# Patient Record
Sex: Male | Born: 1965 | Race: Black or African American | Hispanic: No | Marital: Married | Smoking: Never smoker
Health system: Southern US, Community
[De-identification: ages and names within clinical notes are randomized; demographics above are authoritative.]

## PROBLEM LIST (undated history)

## (undated) DIAGNOSIS — M1990 Unspecified osteoarthritis, unspecified site: Secondary | ICD-10-CM

## (undated) DIAGNOSIS — J189 Pneumonia, unspecified organism: Secondary | ICD-10-CM

## (undated) DIAGNOSIS — G4730 Sleep apnea, unspecified: Secondary | ICD-10-CM

## (undated) DIAGNOSIS — F329 Major depressive disorder, single episode, unspecified: Secondary | ICD-10-CM

## (undated) DIAGNOSIS — F32A Depression, unspecified: Secondary | ICD-10-CM

## (undated) DIAGNOSIS — E119 Type 2 diabetes mellitus without complications: Secondary | ICD-10-CM

## (undated) DIAGNOSIS — K3184 Gastroparesis: Secondary | ICD-10-CM

## (undated) DIAGNOSIS — E039 Hypothyroidism, unspecified: Secondary | ICD-10-CM

## (undated) DIAGNOSIS — Z889 Allergy status to unspecified drugs, medicaments and biological substances status: Secondary | ICD-10-CM

## (undated) DIAGNOSIS — I739 Peripheral vascular disease, unspecified: Secondary | ICD-10-CM

## (undated) DIAGNOSIS — I509 Heart failure, unspecified: Secondary | ICD-10-CM

## (undated) DIAGNOSIS — E1143 Type 2 diabetes mellitus with diabetic autonomic (poly)neuropathy: Secondary | ICD-10-CM

## (undated) DIAGNOSIS — I10 Essential (primary) hypertension: Secondary | ICD-10-CM

## (undated) DIAGNOSIS — Z992 Dependence on renal dialysis: Secondary | ICD-10-CM

## (undated) DIAGNOSIS — N186 End stage renal disease: Secondary | ICD-10-CM

## (undated) HISTORY — PX: HERNIA REPAIR: SHX51

---
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
## 2016-08-04 ENCOUNTER — Inpatient Hospital Stay (HOSPITAL_COMMUNITY)
Admission: EM | Admit: 2016-08-04 | Discharge: 2016-08-06 | DRG: 640 | Disposition: A | Discharge status: Home / Self Care | Payer: Medicare Other | Attending: Internal Medicine | Admitting: Internal Medicine

## 2016-08-04 ENCOUNTER — Emergency Department (HOSPITAL_COMMUNITY): Payer: Medicare Other

## 2016-08-04 ENCOUNTER — Encounter (HOSPITAL_COMMUNITY): Payer: Self-pay | Admitting: Family Medicine

## 2016-08-04 DIAGNOSIS — E1165 Type 2 diabetes mellitus with hyperglycemia: Secondary | ICD-10-CM | POA: Diagnosis present

## 2016-08-04 DIAGNOSIS — E8889 Other specified metabolic disorders: Secondary | ICD-10-CM | POA: Diagnosis present

## 2016-08-04 DIAGNOSIS — R42 Dizziness and giddiness: Secondary | ICD-10-CM | POA: Diagnosis present

## 2016-08-04 DIAGNOSIS — J9601 Acute respiratory failure with hypoxia: Secondary | ICD-10-CM | POA: Diagnosis present

## 2016-08-04 DIAGNOSIS — I5033 Acute on chronic diastolic (congestive) heart failure: Secondary | ICD-10-CM | POA: Diagnosis present

## 2016-08-04 DIAGNOSIS — E11621 Type 2 diabetes mellitus with foot ulcer: Secondary | ICD-10-CM

## 2016-08-04 DIAGNOSIS — R55 Syncope and collapse: Secondary | ICD-10-CM | POA: Diagnosis not present

## 2016-08-04 DIAGNOSIS — D696 Thrombocytopenia, unspecified: Secondary | ICD-10-CM | POA: Diagnosis present

## 2016-08-04 DIAGNOSIS — Z992 Dependence on renal dialysis: Secondary | ICD-10-CM | POA: Diagnosis not present

## 2016-08-04 DIAGNOSIS — I132 Hypertensive heart and chronic kidney disease with heart failure and with stage 5 chronic kidney disease, or end stage renal disease: Secondary | ICD-10-CM | POA: Diagnosis present

## 2016-08-04 DIAGNOSIS — E8779 Other fluid overload: Secondary | ICD-10-CM | POA: Diagnosis present

## 2016-08-04 DIAGNOSIS — R778 Other specified abnormalities of plasma proteins: Secondary | ICD-10-CM | POA: Diagnosis present

## 2016-08-04 DIAGNOSIS — Z794 Long term (current) use of insulin: Secondary | ICD-10-CM

## 2016-08-04 DIAGNOSIS — R51 Headache: Secondary | ICD-10-CM | POA: Diagnosis present

## 2016-08-04 DIAGNOSIS — R7989 Other specified abnormal findings of blood chemistry: Secondary | ICD-10-CM | POA: Diagnosis present

## 2016-08-04 DIAGNOSIS — D638 Anemia in other chronic diseases classified elsewhere: Secondary | ICD-10-CM | POA: Diagnosis present

## 2016-08-04 DIAGNOSIS — Z79899 Other long term (current) drug therapy: Secondary | ICD-10-CM | POA: Diagnosis not present

## 2016-08-04 DIAGNOSIS — Z8673 Personal history of transient ischemic attack (TIA), and cerebral infarction without residual deficits: Secondary | ICD-10-CM

## 2016-08-04 DIAGNOSIS — I509 Heart failure, unspecified: Secondary | ICD-10-CM

## 2016-08-04 DIAGNOSIS — N2581 Secondary hyperparathyroidism of renal origin: Secondary | ICD-10-CM | POA: Diagnosis present

## 2016-08-04 DIAGNOSIS — I5021 Acute systolic (congestive) heart failure: Secondary | ICD-10-CM | POA: Diagnosis not present

## 2016-08-04 DIAGNOSIS — Z9115 Patient's noncompliance with renal dialysis: Secondary | ICD-10-CM | POA: Diagnosis not present

## 2016-08-04 DIAGNOSIS — E1122 Type 2 diabetes mellitus with diabetic chronic kidney disease: Secondary | ICD-10-CM | POA: Diagnosis present

## 2016-08-04 DIAGNOSIS — R748 Abnormal levels of other serum enzymes: Secondary | ICD-10-CM | POA: Diagnosis not present

## 2016-08-04 DIAGNOSIS — I248 Other forms of acute ischemic heart disease: Secondary | ICD-10-CM | POA: Diagnosis present

## 2016-08-04 DIAGNOSIS — E118 Type 2 diabetes mellitus with unspecified complications: Secondary | ICD-10-CM | POA: Diagnosis not present

## 2016-08-04 DIAGNOSIS — R9431 Abnormal electrocardiogram [ECG] [EKG]: Secondary | ICD-10-CM | POA: Diagnosis present

## 2016-08-04 DIAGNOSIS — L97509 Non-pressure chronic ulcer of other part of unspecified foot with unspecified severity: Secondary | ICD-10-CM

## 2016-08-04 DIAGNOSIS — N186 End stage renal disease: Secondary | ICD-10-CM | POA: Diagnosis present

## 2016-08-04 DIAGNOSIS — D631 Anemia in chronic kidney disease: Secondary | ICD-10-CM

## 2016-08-04 DIAGNOSIS — I252 Old myocardial infarction: Secondary | ICD-10-CM

## 2016-08-04 DIAGNOSIS — I161 Hypertensive emergency: Secondary | ICD-10-CM

## 2016-08-04 HISTORY — DX: End stage renal disease: N18.6

## 2016-08-04 HISTORY — DX: Type 2 diabetes mellitus without complications: E11.9

## 2016-08-04 HISTORY — DX: Heart failure, unspecified: I50.9

## 2016-08-04 HISTORY — DX: Essential (primary) hypertension: I10

## 2016-08-04 HISTORY — DX: Dependence on renal dialysis: Z99.2

## 2016-08-04 LAB — BASIC METABOLIC PANEL
Anion gap: 17 — ABNORMAL HIGH (ref 5–15)
BUN: 112 mg/dL — ABNORMAL HIGH (ref 6–20)
CO2: 24 mmol/L (ref 22–32)
Calcium: 9 mg/dL (ref 8.9–10.3)
Chloride: 97 mmol/L — ABNORMAL LOW (ref 101–111)
Creatinine, Ser: 20.07 mg/dL — ABNORMAL HIGH (ref 0.61–1.24)
GFR calc Af Amer: 3 mL/min — ABNORMAL LOW (ref >60)
GFR calc non Af Amer: 2 mL/min — ABNORMAL LOW (ref >60)
Glucose, Bld: 191 mg/dL — ABNORMAL HIGH (ref 65–99)
Potassium: 5.8 mmol/L — ABNORMAL HIGH (ref 3.5–5.1)
Sodium: 138 mmol/L (ref 135–145)

## 2016-08-04 LAB — GLUCOSE, CAPILLARY
Glucose-Capillary: 163 mg/dL — ABNORMAL HIGH (ref 65–99)
Glucose-Capillary: 180 mg/dL — ABNORMAL HIGH (ref 65–99)

## 2016-08-04 LAB — PHOSPHORUS: Phosphorus: 6.7 mg/dL — ABNORMAL HIGH (ref 2.5–4.6)

## 2016-08-04 LAB — I-STAT TROPONIN, ED: Troponin i, poc: 0.55 ng/mL (ref 0.00–0.08)

## 2016-08-04 LAB — CBC
HCT: 40 % (ref 39.0–52.0)
Hemoglobin: 13.3 g/dL (ref 13.0–17.0)
MCH: 32.4 pg (ref 26.0–34.0)
MCHC: 33.3 g/dL (ref 30.0–36.0)
MCV: 97.6 fL (ref 78.0–100.0)
Platelets: 132 10*3/uL — ABNORMAL LOW (ref 150–400)
RBC: 4.1 MIL/uL — ABNORMAL LOW (ref 4.22–5.81)
RDW: 14.9 % (ref 11.5–15.5)
WBC: 10.4 10*3/uL (ref 4.0–10.5)

## 2016-08-04 LAB — TSH: TSH: 1.679 u[IU]/mL (ref 0.350–4.500)

## 2016-08-04 LAB — MAGNESIUM: Magnesium: 2.7 mg/dL — ABNORMAL HIGH (ref 1.7–2.4)

## 2016-08-04 LAB — BRAIN NATRIURETIC PEPTIDE: B Natriuretic Peptide: 1811.8 pg/mL — ABNORMAL HIGH (ref 0.0–100.0)

## 2016-08-04 LAB — TROPONIN I: Troponin I: 0.1 ng/mL (ref <0.03)

## 2016-08-04 MED ORDER — HYDRALAZINE HCL 20 MG/ML IJ SOLN: 5.0000 mg | INTRAMUSCULAR | Status: DC | PRN

## 2016-08-04 MED ORDER — LISINOPRIL 10 MG PO TABS
10.0000 mg | ORAL_TABLET | Freq: Every day | ORAL | Status: DC
  Administered 2016-08-04: 10 mg via ORAL
  Filled 2016-08-04 (×2): qty 1

## 2016-08-04 MED ORDER — ACETAMINOPHEN 325 MG PO TABS
650.0000 mg | ORAL_TABLET | Freq: Four times a day (QID) | ORAL | Status: DC | PRN
  Administered 2016-08-05: 650 mg via ORAL

## 2016-08-04 MED ORDER — ACETAMINOPHEN 650 MG RE SUPP: 650.0000 mg | Freq: Four times a day (QID) | RECTAL | Status: DC | PRN

## 2016-08-04 MED ORDER — SODIUM CHLORIDE 0.9% FLUSH
3.0000 mL | Freq: Two times a day (BID) | INTRAVENOUS | Status: DC
  Administered 2016-08-04 – 2016-08-06 (×4): 3 mL via INTRAVENOUS

## 2016-08-04 MED ORDER — HEPARIN SODIUM (PORCINE) 5000 UNIT/ML IJ SOLN
5000.0000 [IU] | Freq: Three times a day (TID) | INTRAMUSCULAR | Status: DC
  Administered 2016-08-04 – 2016-08-06 (×4): 5000 [IU] via SUBCUTANEOUS
  Filled 2016-08-04 (×6): qty 1

## 2016-08-04 MED ORDER — SODIUM CHLORIDE 0.9% FLUSH: 3.0000 mL | INTRAVENOUS | Status: DC | PRN

## 2016-08-04 MED ORDER — CINACALCET HCL 30 MG PO TABS
90.0000 mg | ORAL_TABLET | Freq: Every day | ORAL | Status: DC
  Administered 2016-08-04 – 2016-08-05 (×2): 90 mg via ORAL
  Filled 2016-08-04 (×3): qty 3

## 2016-08-04 MED ORDER — ASPIRIN 81 MG PO CHEW
324.0000 mg | CHEWABLE_TABLET | Freq: Once | ORAL | Status: AC
  Administered 2016-08-04: 324 mg via ORAL
  Filled 2016-08-04: qty 4

## 2016-08-04 MED ORDER — INSULIN ASPART 100 UNIT/ML ~~LOC~~ SOLN: 0.0000 [IU] | Freq: Every day | SUBCUTANEOUS | Status: DC

## 2016-08-04 MED ORDER — FERRIC CITRATE 1 GM 210 MG(FE) PO TABS: 630.0000 mg | ORAL_TABLET | Freq: Three times a day (TID) | ORAL | Status: DC

## 2016-08-04 MED ORDER — ONDANSETRON HCL 4 MG/2ML IJ SOLN: 4.0000 mg | Freq: Four times a day (QID) | INTRAMUSCULAR | Status: DC | PRN

## 2016-08-04 MED ORDER — SODIUM CHLORIDE 0.9 % IV SOLN: 250.0000 mL | INTRAVENOUS | Status: DC | PRN

## 2016-08-04 MED ORDER — AMLODIPINE BESYLATE 10 MG PO TABS
10.0000 mg | ORAL_TABLET | Freq: Every day | ORAL | Status: DC
  Administered 2016-08-04: 10 mg via ORAL
  Filled 2016-08-04: qty 2
  Filled 2016-08-04: qty 1

## 2016-08-04 MED ORDER — NITROGLYCERIN 2 % TD OINT
1.0000 [in_us] | TOPICAL_OINTMENT | Freq: Once | TRANSDERMAL | Status: AC
  Administered 2016-08-04: 1 [in_us] via TOPICAL
  Filled 2016-08-04: qty 1

## 2016-08-04 MED ORDER — CINACALCET HCL 30 MG PO TABS: 90.0000 mg | ORAL_TABLET | Freq: Every evening | ORAL | Status: DC

## 2016-08-04 MED ORDER — ONDANSETRON HCL 4 MG PO TABS: 4.0000 mg | ORAL_TABLET | Freq: Four times a day (QID) | ORAL | Status: DC | PRN

## 2016-08-04 MED ORDER — FERRIC CITRATE 1 GM 210 MG(FE) PO TABS
420.0000 mg | ORAL_TABLET | Freq: Three times a day (TID) | ORAL | Status: DC
  Administered 2016-08-04 – 2016-08-06 (×4): 420 mg via ORAL
  Filled 2016-08-04 (×7): qty 2

## 2016-08-04 MED ORDER — ACETAMINOPHEN 500 MG PO TABS
1000.0000 mg | ORAL_TABLET | Freq: Once | ORAL | Status: AC
  Administered 2016-08-04: 1000 mg via ORAL
  Filled 2016-08-04: qty 2

## 2016-08-04 MED ORDER — METOPROLOL SUCCINATE ER 100 MG PO TB24
100.0000 mg | ORAL_TABLET | Freq: Every day | ORAL | Status: DC
  Administered 2016-08-04: 100 mg via ORAL
  Filled 2016-08-04 (×2): qty 1

## 2016-08-04 MED ORDER — BRIMONIDINE TARTRATE 0.2 % OP SOLN
1.0000 [drp] | Freq: Two times a day (BID) | OPHTHALMIC | Status: DC
  Administered 2016-08-04 – 2016-08-06 (×3): 1 [drp] via OPHTHALMIC
  Filled 2016-08-04: qty 5

## 2016-08-04 MED ORDER — INSULIN ASPART 100 UNIT/ML ~~LOC~~ SOLN
0.0000 [IU] | Freq: Three times a day (TID) | SUBCUTANEOUS | Status: DC
Start: 2016-08-04 — End: 2016-08-06
  Administered 2016-08-04 – 2016-08-05 (×3): 2 [IU] via SUBCUTANEOUS
  Administered 2016-08-06: 3 [IU] via SUBCUTANEOUS

## 2016-08-04 NOTE — Consult Note (Signed)
[facility] Renal Consultation Note    Indication for Consultation:  Management of ESRD/hemodialysis, anemia, hypertension/volume, and secondary hyperparathyroidism. PCP:  HPI: Howard Bell is a 51 y.o. male with ESRD (x 8 years), Type 2 DM, CAD (Hx MI), ?ICM (LVEF 26% in 2015 per outpt HD notes), Hx CVA who is being admitted for dyspnea/volume overload in setting of missed HD.  Usually dialyzes MWF at [facility], just moved here in early April 2018 from [place], [place]. Last dialysis was Friday, 07/30/16. Missed HD on Monday (5/14) due to court date in [place] and unable to get HD scheduled there. Was feeling fine until yesterday when he felt dizzy, then this morning started vomiting and had syncopal episode prompting EMS being called and transporting here. His wife also noticed that he was breathing harder than usual last night, also that his face and L arm were swollen. In ED, labs showed Hgb 13.3, WBC 10.4, K 5.8, Trop 0.55. EKG abnormal per ED team; ?ischemic changes (not in Epic). CXR showed + pulmonary edema. Feels better already 2 hours into HD- goal of 5 liters  ROS (limited as pt on oxygen and lethargic): Denies fever, chills, abdominal pain or chest pain. + dyspnea (on nasal oyxgen). + headache, dizzy yesterday. + syncope this morning.   Physical Exam: Vitals:   08/04/16 1015 08/04/16 1030 08/04/16 1100 08/04/16 1115  BP: (!) 199/128 (!) 185/108 (!) 179/104 (!) 182/114  Pulse: (!) 105 (!) 105 (!) 105 (!) 105  Resp: 17 (!) 26 (!) 30 (!) 23  Temp:      TempSrc:      SpO2: 100% 91% 92% (!) 89%     General: Well developed, well nourished, NAD, but breathing heavily on nasal oxygen. Head: Normocephalic, atraumatic. + facial edema. Neck: Supple without lymphadenopathy/masses.  Lungs: Bibasilar rales. Clear in upper lobes. Heart: RRR with normal S1, S2. No murmurs, rubs, or gallops appreciated. Abdomen: Soft, non-tender. Musculoskeletal:  Strength and tone  appear normal for age. Lower extremities: No LE edema. Small scabbed area on L shin, callous on L calf area. Neuro: Alert and oriented X 3, although lethargic. Moves all extremities spontaneously. Psych:  Responds to questions appropriately. Dialysis Access: LUE AVG + bruit/thrill.  Allergies: No Known Allergies listed here, Ferrelicit on home allergy list.  Medications: Per home med list: Auryxia 2/meals, Sensipar 90mg  QD, Norvasc 10mg  QD, Toprol XL 100mg  QD, Humalog 6U TID with meals, eye drops (trosopt, brimonidine, travaprost).  Labs: Basic Metabolic Panel:  Recent Labs Lab 08/04/16 1010  NA 138  K 5.8*  CL 97*  CO2 24  GLUCOSE 191*  BUN 112*  CREATININE 20.07*  CALCIUM 9.0   CBC:  Recent Labs Lab 08/04/16 1010  WBC 10.4  HGB 13.3  HCT 40.0  MCV 97.6  PLT 132*   Studies/Results: Dg Chest Port 1 View  Result Date: 08/04/2016 CLINICAL DATA:  Shortness of breath. EXAM: PORTABLE CHEST 1 VIEW COMPARISON:  No recent prior. FINDINGS: Cardiomegaly. Bilateral pulmonary infiltrates, particular prominent on the right are noted. Findings consistent with asymmetric pulmonary edema and/or pneumonia. No pleural effusion or pneumothorax. IMPRESSION: 1. Cardiomegaly. 2. Diffuse bilateral pulmonary infiltrates, particularly prominent on the right. Findings consist with asymmetric pulmonary edema and/or pneumonia. Electronically Signed   By: Marcello Moores  Register   On: 08/04/2016 10:30    Dialysis Orders:  MWF at [facility] 4:15hr, BFR 450, DFRA2.0, EDW 94kg, 2K/2.25Ca bath, AVG - Heparin 9K units q HD - No VDRA as outpt  d/t high Ca - BMM meds: Auryxia 2/meals, Sensipar 90mg  daily  Assessment/Plan: 1.  Acute Respiratory Failure: In setting of missed HD, for urgent HD today. Likely will need another HD tomorrow. Wait and see 2. ?Abnormal EKG: No baseline EKG in system. Hx prior MI. Work-up per primary team. 3.  ESRD: Usual MWF schedule. Missed last HD. Plan for HD today  and tomorrow, then back to MWF schedule. 4.  Hypertension/volume: BP high with volume excess. 4-5L goal today. 5.  Anemia: Hgb 13.3. No need for ESA. 6.  Metabolic bone disease: Ca 9. Continue Auryxia/Sensipar. 7.  DM: on insulin, per primary.  Veneta Penton, PA-C 08/04/2016, 11:21 AM  [facility]  Patient seen and examined, agree with above note with above modifications. Fairly new to [place].  Missed HD on Monday due to court date- now presents with SOB- volume overload.   HD this afternoon- goal of 5 liters- tolerating well and feeling better already  Corliss Parish, MD 08/04/2016    Pager: [phone]

## 2016-08-04 NOTE — Consult Note (Signed)
CARDIOLOGY CONSULT NOTE  Patient ID: Howard Bell MRN: [id number] DOB/AGE: [birth year] 51 y.o.  Admit date: 08/04/2016 Referring Physician  Orlie Dakin, MD Primary Physician:  No primary care provider on file. Reason for Consultation  Abnormal EKG  HPI: Howard Bell  is a 51 y.o. male . Patient has history of end-stage liver disease on hemodialysis for the past 6-7 years, previously uncontrolled diabetes mellitus, now well controlled, hypertension, also previously not well controlled. He recently moved to [facility] from [facility] area where he was getting hemodialysis regularly. He also states that last year due to shortness of breath, he was admitted to the hospital in [place], [place] and has had a echocardiogram and a stress test and was told to be normal.  He presented to the emergency room with worsening shortness of breath via EMS and was found to be hypoxemic with marked respiratory distress. Due to abnormal EKG, serum troponins were obtained which was mildly elevated. I was asked by ED physicians to evaluate the patient from cardiac stent point.  Patient symptoms improved with routine therapy with Nitropaste, oxygen supplementation. He denied any chest pain. Denies any symptoms suggestive claudication. He has not had any history of TIA or stroke but states that when he was 51 years of age, he was having nausea and vomiting and was taken to the doctor's office by his mother and was told to have a stroke, history is very sketchy.  Past Medical History:  Diagnosis Date  . Congestive heart failure (CHF) ([place])   . 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 ESRD (end stage renal disease) on dialysis ([place])   . Hypertension      Past Surgical History:  Procedure Laterality Date  . HERNIA REPAIR       Family History  Problem Relation Age of Onset  . Diabetes Mother   . Hypertension Father      Social History: Social History   Social  History  . Marital status: N/A    Spouse name: N/A  . Number of children: N/A  . Years of education: N/A   Occupational History  . Not on file.   Social History Main Topics  . Smoking status: Never Smoker  . Smokeless tobacco: Never Used  . Alcohol use No  . Drug use: No  . Sexual activity: Not on file   Other Topics Concern  . Not on file   Social History Narrative  . No narrative on file     Prescriptions Prior to Admission  Medication Sig Dispense Refill Last Dose  . amLODipine (NORVASC) 10 MG tablet Take 10 mg by mouth daily.   08/03/2016 at Unknown time  . brimonidine (ALPHAGAN) 0.2 % ophthalmic solution Place 1 drop into both eyes 2 (two) times daily.   08/03/2016 at Unknown time  . cinacalcet (SENSIPAR) 90 MG tablet Take 90 mg by mouth every evening.    08/03/2016 at Unknown time  . ferric citrate (AURYXIA) 1 GM 210 MG(Fe) tablet Take 630 mg by mouth 3 (three) times daily with meals.   08/03/2016 at Unknown time  . insulin lispro (HUMALOG KWIKPEN) 100 UNIT/ML KiwkPen Inject 5-10 Units into the skin See admin instructions. Per sliding scale   08/03/2016 at Unknown time  . lisinopril (PRINIVIL,ZESTRIL) 10 MG tablet Take 10 mg by mouth daily.   08/03/2016 at Unknown time  . metoprolol succinate (TOPROL-XL) 100 MG 24 hr tablet Take 100 mg by mouth daily. Take with or immediately following  a meal.   08/03/2016 at 0700     Review of Systems - Marked shortness of breath, generalized weakness, decreased physical tolerance. No change in appetite, no change in weight, no tingling or numbness or weakness in the extremity, no nausea or vomiting, no dark stools, has mild arthritis in his knee, other systems negative.   Physical Exam: Blood pressure 138/88, pulse (!) 101, temperature 98.9 F (37.2 C), temperature source Oral, resp. rate 18, weight 99.9 kg (220 lb 3.8 oz), SpO2 98 %. There is no height or weight on file to calculate BMI.   General appearance: alert, cooperative, appears  older than stated age, mild distress and moderately obese Neck: no adenopathy, no carotid bruit, supple, symmetrical, trachea midline, thyroid not enlarged, symmetric, no tenderness/mass/nodules and JVD present Lungs: clear to auscultation bilaterally Chest wall: no tenderness Heart: regular rate and rhythm, S1, S2 normal, no murmur, click, rub or gallop Abdomen: soft, non-tender; bowel sounds normal; no masses,  no organomegaly and Obese Extremities: extremities normal, atraumatic, no cyanosis or edema Pulses: 2+ and symmetric, left brachial AV fistula present and functioning. Neurologic: Grossly normal  Labs:  Lab Results  Component Value Date   WBC 10.4 08/04/2016   HGB 13.3 08/04/2016   HCT 40.0 08/04/2016   MCV 97.6 08/04/2016   PLT 132 (L) 08/04/2016    Recent Labs Lab 08/04/16 1010  NA 138  K 5.8*  CL 97*  CO2 24  BUN 112*  CREATININE 20.07*  CALCIUM 9.0  GLUCOSE 191*    Lipid Panel  No results found for: CHOL, TRIG, HDL, CHOLHDL, VLDL, LDLCALC  BNP (last 3 results) No results for input(s): BNP in the last 8760 hours.  HEMOGLOBIN A1C No results found for: HGBA1C, MPG  Cardiac Panel (last 3 results) No results for input(s): CKTOTAL, CKMB, TROPONINI, RELINDX in the last 8760 hours.  No results found for: CKTOTAL, CKMB, CKMBINDEX, TROPONINI   TSH No results for input(s): TSH in the last 8760 hours.    Radiology: Dg Chest Port 1 View  Result Date: 08/04/2016 CLINICAL DATA:  Shortness of breath. EXAM: PORTABLE CHEST 1 VIEW COMPARISON:  No recent prior. FINDINGS: Cardiomegaly. Bilateral pulmonary infiltrates, particular prominent on the right are noted. Findings consistent with asymmetric pulmonary edema and/or pneumonia. No pleural effusion or pneumothorax. IMPRESSION: 1. Cardiomegaly. 2. Diffuse bilateral pulmonary infiltrates, particularly prominent on the right. Findings consist with asymmetric pulmonary edema and/or pneumonia. Electronically Signed   By:  [place]   On: 08/04/2016 10:30    Scheduled Meds: . amLODipine  10 mg Oral Daily  . brimonidine  1 drop Both Eyes BID  . cinacalcet  90 mg Oral Q supper  . ferric citrate  420 mg Oral TID WC  . heparin  5,000 Units Subcutaneous Q8H  . insulin aspart  0-5 Units Subcutaneous QHS  . insulin aspart  0-9 Units Subcutaneous TID WC  . lisinopril  10 mg Oral Daily  . metoprolol succinate  100 mg Oral Daily  . sodium chloride flush  3 mL Intravenous Q12H   Continuous Infusions: . sodium chloride     PRN Meds:.sodium chloride, acetaminophen **OR** acetaminophen, hydrALAZINE, ondansetron **OR** ondansetron (ZOFRAN) IV, sodium chloride flush  CARDIAC STUDIES: EKG: Normal sinus rhythm, left axis deviation, left anterior fascicular block, LVH with repolarization. Normal QT interval.  Echo: Pending  ASSESSMENT AND PLAN:  1. Acute on chronic diastolic heart failure due to missing hemodialysis, patient presently volume overloaded. 2. 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 Hypertension with hypertensive heart disease with CHF 4. Abnormal serum troponin secondary to CHF. 5. Thrombocytopenia,? Chronic  Recommendation: Patient states that he's had an echocardiogram and also nuclear stress test in [place], [place] a year ago and was told to be normal. I do not see any acute ischemic changes from the EKG. Symptoms are not consistent with ACS.  Suspect with dialysis reinitiation, that his symptoms will improve. I have discussed with them regarding making lifestyle changes, unfortunately in spite of multiple medical comorbidities, patient does not appear to have made any dietary changes. He is otherwise on appropriate medical therapy. Control of blood pressure, regular scheduled hemodialysis would probably keep him stable. I'll be happy to see him back on a when necessary basis in the outpatient setting. Would recommend checking his lipid profile.  Adrian Prows, MD 08/04/2016, 6:15  PM [facility] [place] Pager: [phone] Office: [phone] If no answer Cell [phone]

## 2016-08-04 NOTE — Procedures (Signed)
Patient was seen on dialysis and the procedure was supervised.  BFR 300  Via AVG BP is  149/94.   Patient appears to be tolerating treatment well- emergent HD for missed HD  Elex Mainwaring A 08/04/2016

## 2016-08-04 NOTE — ED Notes (Signed)
X-ray at bedside

## 2016-08-04 NOTE — ED Provider Notes (Signed)
[facility] DEPT Provider Note   CSN: [id number] Arrival date & time: 08/04/16  4098     History   Chief Complaint Chief Complaint  Patient presents with  . Shortness of Breath    HPI Howard Bell is a 51 y.o. male.  HPI light of shortness of breath onset 7 AM today. Patient states. Other associated symptoms include dizziness. He denies pain anywhere. He missed his regularly scheduled dialysis session 2 days goes he was out of town. He was brought by EMS EMS reports pulse oximetry in the 70s on room air. EMS treated him with supplemental oxygen. No ring makes symptoms better or worse. Breathing somewhat improved with treatment with oxygen  No past medical history on file. Congestive heart failure, diabetes, sepsis, end-stage renal disease, hypertension There are no active problems to display for this patient.   No past surgical history on file. Partial amputation of left ring finger, hemodialysis graft at left arm    Home Medications    Prior to Admission medications   Not on File    Family History No family history on file.  Social History Social History  Substance Use Topics  . Smoking status: Not on file  . Smokeless tobacco: Not on file  . Alcohol use Not on file  No tobacco no alcohol no drugs   Allergies   Patient has no known allergies.   Review of Systems Review of Systems  Constitutional: Negative.   HENT: Negative.   Respiratory: Negative.   Cardiovascular: Positive for chest pain.       Syncope  Gastrointestinal: Negative.   Genitourinary:       Hemodialysis patient  Musculoskeletal: Negative.   Skin: Negative.   Allergic/Immunologic: Positive for immunocompromised state.  Neurological: Positive for dizziness and headaches.       Diffuse headache gradual onset last night  Psychiatric/Behavioral: Negative.   All other systems reviewed and are negative.    Physical Exam Updated Vital Signs BP (!) 182/98 (BP Location: Right  Arm)   Pulse (!) 109   Temp 98.3 F (36.8 C) (Oral)   Resp (!) 28   SpO2 100%   Physical Exam  Constitutional: He is oriented to person, place, and time. He appears well-developed and well-nourished. No distress.  HENT:  Head: Normocephalic and atraumatic.  Eyes: Conjunctivae are normal. Pupils are equal, round, and reactive to light.  No nystagmus  Neck: Neck supple. No tracheal deviation present. No thyromegaly present.  Cardiovascular: Regular rhythm.   No murmur heard. Mildly tachycardic  Pulmonary/Chest: Effort normal.  Minutes breath sounds diffusely. No respiratory distress while on oxygen  Abdominal: Soft. Bowel sounds are normal. He exhibits no distension. There is no tenderness.  Obese  Musculoskeletal: Normal range of motion. He exhibits no edema or tenderness.  Left upper extremity with dialysis graft with good thrill  Neurological: He is alert and oriented to person, place, and time. Coordination normal.  Skin: Skin is warm and dry. No rash noted.  Psychiatric: He has a normal mood and affect.  Nursing note and vitals reviewed.    ED Treatments / Results  Labs (all labs ordered are listed, but only abnormal results are displayed) Labs Reviewed  BASIC METABOLIC PANEL  CBC  .edt  EKG  EKG Interpretation None     ED ECG REPORT Results for orders placed or performed during the hospital encounter of 11/91/47  Basic metabolic panel  Result Value Ref Range   Sodium 138 135 - 145 mmol/L  Potassium 5.8 (H) 3.5 - 5.1 mmol/L   Chloride 97 (L) 101 - 111 mmol/L   CO2 24 22 - 32 mmol/L   Glucose, Bld 191 (H) 65 - 99 mg/dL   BUN 112 (H) 6 - 20 mg/dL   Creatinine, Ser 20.07 (H) 0.61 - 1.24 mg/dL   Calcium 9.0 8.9 - 10.3 mg/dL   GFR calc non Af Amer 2 (L) >60 mL/min   GFR calc Af Amer 3 (L) >60 mL/min   Anion gap 17 (H) 5 - 15  CBC  Result Value Ref Range   WBC 10.4 4.0 - 10.5 K/uL   RBC 4.10 (L) 4.22 - 5.81 MIL/uL   Hemoglobin 13.3 13.0 - 17.0 g/dL   HCT  40.0 39.0 - 52.0 %   MCV 97.6 78.0 - 100.0 fL   MCH 32.4 26.0 - 34.0 pg   MCHC 33.3 30.0 - 36.0 g/dL   RDW 14.9 11.5 - 15.5 %   Platelets 132 (L) 150 - 400 K/uL  I-stat troponin, ED  Result Value Ref Range   Troponin i, poc 0.55 (HH) 0.00 - 0.08 ng/mL   Comment NOTIFIED PHYSICIAN    Comment 3           Dg Chest Port 1 View  Result Date: 08/04/2016 CLINICAL DATA:  Shortness of breath. EXAM: PORTABLE CHEST 1 VIEW COMPARISON:  No recent prior. FINDINGS: Cardiomegaly. Bilateral pulmonary infiltrates, particular prominent on the right are noted. Findings consistent with asymmetric pulmonary edema and/or pneumonia. No pleural effusion or pneumothorax. IMPRESSION: 1. Cardiomegaly. 2. Diffuse bilateral pulmonary infiltrates, particularly prominent on the right. Findings consist with asymmetric pulmonary edema and/or pneumonia. Electronically Signed   By: Marcello Moores  Register   On: 08/04/2016 10:30    Date: 08/04/2016  Rate: 110  Rhythm: sinus tachycardia  QRS Axis: left  Intervals: normal  ST/T Wave abnormalities: Lateral ischemia  Conduction Disutrbances:left anterior fascicular block  Narrative Interpretation:   Old EKG Reviewed: none available  I have personally reviewed the EKG tracing and agree with the computerized printout as noted.  Radiology No results found. Chest x-ray viewed by me Procedures Procedures (including critical care time)  Medications Ordered in ED Medications - No data to display  10:40 AM patient reports he is breathing comfortably on oxygen nasal cannula 5 L. Pulse oximetry on oxygen 5 L 92%, consistent with hypoxia however except verbal  Initial Impression / Assessment and Plan / ED Course  I have reviewed the triage vital signs and the nursing notes.  Pertinent labs & imaging results that were available during my care of the patient were reviewed by me and considered in my medical decision making (see chart for details).     Doubt pneumonia no fever no  cough, no leukocytosis. Admits noncompliance with hemodialysis. Symptoms most consistent with congestive heart failure Nephrology service Dr.Schertz consulted who will arrange for hemodialysis. I also consulted hospitalist service Dr. Barbaraann Faster who will arrange for admission and see patient in ED. I also consulted cardiology service Dr. Nadyne Coombes who will see patient in the hospital.  NSTEMI a possibility, though doubtful. Most likely elevated troponin secondary to CHF although patient certainly is at risk for coronary artery disease given comorbidity  aspirin ordered  Final Clinical Impressions(s) / ED Diagnoses  Diagnosis #1 congestive heart failure  #2 mild hyperkaemia Final diagnoses:  None  #3 noncompliance with dialysis #4 hyperglycemia  New Prescriptions CRITICAL CARE Performed by: Orlie Dakin Total critical care time: 30 minutes Critical care time was exclusive  of separately billable procedures and treating other patients. Critical care was necessary to treat or prevent imminent or life-threatening deterioration. Critical care was time spent personally by me on the following activities: development of treatment plan with patient and/or surrogate as well as nursing, discussions with consultants, evaluation of patient's response to treatment, examination of patient, obtaining history from patient or surrogate, ordering and performing treatments and interventions, ordering and review of laboratory studies, ordering and review of radiographic studies, pulse oximetry and re-evaluation of patient's condition. New Prescriptions   No medications on file     Orlie Dakin, MD 08/04/16 1147

## 2016-08-04 NOTE — ED Triage Notes (Signed)
PT has not been to dialysis in a week; EMS on scene found sats in 60's. 5L [place] brought him up into the 80's. Sats 100% on nonrebreather. A&Ox4.

## 2016-08-04 NOTE — ED Notes (Signed)
Got patient undress on the monitor did ekg shown to Dr Winfred Leeds

## 2016-08-04 NOTE — ED Notes (Signed)
RN removed nonrebreather to 5 L [place]. Will continue to monitor.

## 2016-08-04 NOTE — Progress Notes (Signed)
Admission note:  Arrival Method: Patient arrived from HD but from ED. Mental Orientation:  Alert and oriented x 4. Telemetry: 6E-02, CCMD notified. Assessment: See flow sheets Skin: Refused the skin assessment, witnessed by another nurse Gwenlyn Perking). IV: Right forearm saline lock. Pain: Denies any pain currently. Tubes: N/A Safety Measures: Bed in low position, call bell and phone within reach. Fall Prevention Safety Plan: Reviewed the plan, understood and acknowledged. Admission Screening: In progress 6700 Orientation: Patient has been oriented to the unit, staff and to the room.

## 2016-08-04 NOTE — H&P (Signed)
History and Physical    Howard Bell TDD:[id number] DOB: [birth year] DOA: 08/04/2016  PCP: No primary care provider on file. Patient coming from: home  Chief Complaint: SOB  HPI: Howard Bell is a 51 y.o. male with medical history significant of ESRD on dialysis, DM, HTN, really simply moved to [facility] from [facility] area. Patient has established care with a dialysis center but has no PCP.  Patient endorses acute onset shortness of breath with cough and posttussive emesis 1. Denies fevers, chest pain, palpitations, nausea, abdominal pain, dysuria, frequency, neck stiffness, focal neurological deficit. Patient does endorse headache for the last 1 day. Has not taken anything for his symptoms. Patient states his dialysis days are Monday Wednesday and Friday. Patient missed dialysis on Monday, 08/02/2016, due to being out of town. Last dialysis was on 07/30/2016. Patient states he is compliant with his home medications but did not take them prior to arrival. EMS was called to the patient's house and O2 saturations were noted to be in the 70s with marked respiratory distress. In the ED patient was placed on 5 L nasal cannula with improvement in O2 saturations into the low 90s.  ED Course: provided O2. Objective findings outlined below. Nephrology and Cardiology consulted by EDP.   Review of Systems: As per HPI otherwise all other systems reviewed and are negative  Ambulatory Status:no restrictions at baseline.   Past Medical History:  Diagnosis Date  . Congestive heart failure (CHF) ([place])   . ESRD (end stage renal disease) on dialysis ([place])   . Hypertension     Past Surgical History:  Procedure Laterality Date  . HERNIA REPAIR      Social History   Social History  . Marital status: N/A    Spouse name: N/A  . Number of children: N/A  . Years of education: N/A   Occupational History  . Not on file.   Social History Main Topics  . Smoking status: Never Smoker  .  Smokeless tobacco: Never Used  . Alcohol use No  . Drug use: No  . Sexual activity: Not on file   Other Topics Concern  . Not on file   Social History Narrative  . No narrative on file    No Known Allergies  Family History  Problem Relation Age of Onset  . Diabetes Mother   . Hypertension Father     Prior to Admission medications   Not on File    Physical Exam: Vitals:   08/04/16 1100 08/04/16 1115 08/04/16 1130 08/04/16 1145  BP: (!) 179/104 (!) 182/114 (!) 159/91 (!) 158/79  Pulse: (!) 105 (!) 105 (!) 103 100  Resp: (!) 30 (!) 23 20 (!) 27  Temp:      TempSrc:      SpO2: 92% (!) 89% 92% 90%     General:  Appears calm and comfortable Eyes:  PERRL, EOMI, normal lids, iris ENT:  grossly normal hearing, lips & tongue, mmm Neck:  no LAD, masses or thyromegaly Cardiovascular: Difficult to appreciate due to body habitus and O2 administration. Faint 2/6 systolic murmur, regular rate and rhythm, Respiratory: Diminished breath sounds in the bases. Increased effort. On 4 L nasal cannula with O2 saturations of 90%. Few crackles bilaterally Abdomen:  soft, ntnd, NABS Skin:  no rash or induration seen on limited exam Musculoskeletal:  grossly normal tone BUE/BLE, good ROM, no bony abnormality Psychiatric:  grossly normal mood and affect, speech fluent and appropriate, AOx3 Neurologic:  CN 2-12 grossly intact, moves  all extremities in coordinated fashion, sensation intact  Labs on Admission: I have personally reviewed following labs and imaging studies  CBC:  Recent Labs Lab 08/04/16 1010  WBC 10.4  HGB 13.3  HCT 40.0  MCV 97.6  PLT 478*   Basic Metabolic Panel:  Recent Labs Lab 08/04/16 1010  NA 138  K 5.8*  CL 97*  CO2 24  GLUCOSE 191*  BUN 112*  CREATININE 20.07*  CALCIUM 9.0   GFR: CrCl cannot be calculated (Unknown ideal weight.). Liver Function Tests: No results for input(s): AST, ALT, ALKPHOS, BILITOT, PROT, ALBUMIN in the last 168 hours. No  results for input(s): LIPASE, AMYLASE in the last 168 hours. No results for input(s): AMMONIA in the last 168 hours. Coagulation Profile: No results for input(s): INR, PROTIME in the last 168 hours. Cardiac Enzymes: No results for input(s): CKTOTAL, CKMB, CKMBINDEX, TROPONINI in the last 168 hours. BNP (last 3 results) No results for input(s): PROBNP in the last 8760 hours. HbA1C: No results for input(s): HGBA1C in the last 72 hours. CBG: No results for input(s): GLUCAP in the last 168 hours. Lipid Profile: No results for input(s): CHOL, HDL, LDLCALC, TRIG, CHOLHDL, LDLDIRECT in the last 72 hours. Thyroid Function Tests: No results for input(s): TSH, T4TOTAL, FREET4, T3FREE, THYROIDAB in the last 72 hours. Anemia Panel: No results for input(s): VITAMINB12, FOLATE, FERRITIN, TIBC, IRON, RETICCTPCT in the last 72 hours. Urine analysis: No results found for: COLORURINE, APPEARANCEUR, LABSPEC, PHURINE, GLUCOSEU, HGBUR, BILIRUBINUR, KETONESUR, PROTEINUR, UROBILINOGEN, NITRITE, LEUKOCYTESUR  Creatinine Clearance: CrCl cannot be calculated (Unknown ideal weight.).  Sepsis Labs: @LABRCNTIP (procalcitonin:4,lacticidven:4) )No results found for this or any previous visit (from the past 240 hour(s)).   Radiological Exams on Admission: Dg Chest Port 1 View  Result Date: 08/04/2016 CLINICAL DATA:  Shortness of breath. EXAM: PORTABLE CHEST 1 VIEW COMPARISON:  No recent prior. FINDINGS: Cardiomegaly. Bilateral pulmonary infiltrates, particular prominent on the right are noted. Findings consistent with asymmetric pulmonary edema and/or pneumonia. No pleural effusion or pneumothorax. IMPRESSION: 1. Cardiomegaly. 2. Diffuse bilateral pulmonary infiltrates, particularly prominent on the right. Findings consist with asymmetric pulmonary edema and/or pneumonia. Electronically Signed   By: Marcello Moores  Register   On: 08/04/2016 10:30    EKG: Independently reviewed. LAFB, sinus, no overt signs of  ACS  Assessment/Plan Active Problems:   ESRD (end stage renal disease) (HCC)   Acute respiratory failure with hypoxemia (HCC)   Diabetes mellitus with complication (HCC)   Acute CHF (congestive heart failure) ([place])   Hypertensive emergency   Acute respiratory failure: Likely multi-factorial secondary to pulmonary edema secondary to missed dialysis session in the setting of decompensated congestive heart failure. Low suspicion for infectious etiology. Patient endorses recently coming off of Levaquin for an unknown infection. Chest x-ray as above. - Continue O2 and wean as able. - Treatment of ESRD and CHF as below - Pro calcitonin,  Congestive heart failure: Suspect decompensated diastolic failure based on patient's pressures and history. No old records to compare.  - Continue beta blocker and ACE inhibitor  - Dialysis per nephrology  - Echo  - Strict I's and O's, daily weights  Elevated trop: EKG reasurring. Asymptomatic. Suspect from demand in setting of ARF/CHF w/ false elevation from ESRD.  - Trend Trop - Tele - Cards consulted by EDP and appreciate their input  ESRD dialylsis: Last dialysis 5 days ago. Likely to need multiple rounds of dialysis.  - dialysis per nephrology - Continue renal vitamins  HTN emergency: Blood pressure running  as high as 199/128. Patient did not take his antihypertensives on the day of admission. Nitroglycerin placed applied and patient placed on oxygen by EDP with marked improvement of pressure - Resume Norvasc, Metop, Lisinopril - Hydralazine when necessary  Social: Pt will need PCP at time of discharge for f/u   DVT prophylaxis: Hep  Code Status: full  Family Communication: none  Disposition Plan: pending improvementi n respiratory status from dialysis  Consults called: Nephrology, Cardiology  Admission status: Inpt    Riane Rung J MD [facility]  If 7PM-7AM, please contact night-coverage  [email or web address] Password  [facility]  08/04/2016, 12:22 PM

## 2016-08-05 ENCOUNTER — Other Ambulatory Visit (HOSPITAL_COMMUNITY): Payer: Self-pay

## 2016-08-05 DIAGNOSIS — I5021 Acute systolic (congestive) heart failure: Secondary | ICD-10-CM

## 2016-08-05 LAB — GLUCOSE, CAPILLARY
Glucose-Capillary: 157 mg/dL — ABNORMAL HIGH (ref 65–99)
Glucose-Capillary: 203 mg/dL — ABNORMAL HIGH (ref 65–99)
Glucose-Capillary: 179 mg/dL — ABNORMAL HIGH (ref 65–99)
Glucose-Capillary: 152 mg/dL — ABNORMAL HIGH (ref 65–99)

## 2016-08-05 LAB — HIV ANTIBODY (ROUTINE TESTING W REFLEX): HIV Screen 4th Generation wRfx: NONREACTIVE

## 2016-08-05 LAB — CBC
HCT: 36.5 % — ABNORMAL LOW (ref 39.0–52.0)
Hemoglobin: 12.4 g/dL — ABNORMAL LOW (ref 13.0–17.0)
MCH: 32.7 pg (ref 26.0–34.0)
MCHC: 34 g/dL (ref 30.0–36.0)
MCV: 96.3 fL (ref 78.0–100.0)
Platelets: 123 10*3/uL — ABNORMAL LOW (ref 150–400)
RBC: 3.79 MIL/uL — ABNORMAL LOW (ref 4.22–5.81)
RDW: 14.8 % (ref 11.5–15.5)
WBC: 7.5 10*3/uL (ref 4.0–10.5)

## 2016-08-05 LAB — COMPREHENSIVE METABOLIC PANEL
ALT: 11 U/L — ABNORMAL LOW (ref 17–63)
AST: 16 U/L (ref 15–41)
Albumin: 3.2 g/dL — ABNORMAL LOW (ref 3.5–5.0)
Alkaline Phosphatase: 109 U/L (ref 38–126)
Anion gap: 16 — ABNORMAL HIGH (ref 5–15)
BUN: 90 mg/dL — ABNORMAL HIGH (ref 6–20)
CO2: 25 mmol/L (ref 22–32)
Calcium: 8.6 mg/dL — ABNORMAL LOW (ref 8.9–10.3)
Chloride: 96 mmol/L — ABNORMAL LOW (ref 101–111)
Creatinine, Ser: 17.33 mg/dL — ABNORMAL HIGH (ref 0.61–1.24)
GFR calc Af Amer: 3 mL/min — ABNORMAL LOW (ref >60)
GFR calc non Af Amer: 3 mL/min — ABNORMAL LOW (ref >60)
Glucose, Bld: 121 mg/dL — ABNORMAL HIGH (ref 65–99)
Potassium: 4.6 mmol/L (ref 3.5–5.1)
Sodium: 137 mmol/L (ref 135–145)
Total Bilirubin: 1.2 mg/dL (ref 0.3–1.2)
Total Protein: 6.8 g/dL (ref 6.5–8.1)

## 2016-08-05 LAB — TROPONIN I
Troponin I: 0.1 ng/mL (ref <0.03)
Troponin I: 0.06 ng/mL (ref <0.03)

## 2016-08-05 LAB — MRSA PCR SCREENING: MRSA by PCR: NEGATIVE

## 2016-08-05 MED ORDER — ALUM & MAG HYDROXIDE-SIMETH 200-200-20 MG/5ML PO SUSP
30.0000 mL | Freq: Once | ORAL | Status: DC
  Filled 2016-08-05: qty 30

## 2016-08-05 MED ORDER — ACETAMINOPHEN 325 MG PO TABS
ORAL_TABLET | ORAL | Status: AC
Start: 2016-08-05 — End: 2016-08-05
  Administered 2016-08-05: 650 mg via ORAL
  Filled 2016-08-05: qty 2

## 2016-08-05 MED ORDER — NITROGLYCERIN 0.4 MG/SPRAY TL SOLN
1.0000 | Status: DC | PRN
  Administered 2016-08-05: 1 via SUBLINGUAL
  Filled 2016-08-05: qty 4.9

## 2016-08-05 NOTE — Progress Notes (Signed)
PROGRESS NOTE                                                                                                                                                                                                             Patient Demographics:    Howard Bell, is a 51 y.o. male, DOB - [birth year], ASN:[id number]  Admit date - 08/04/2016   Admitting Physician Waldemar Dickens, MD  Outpatient Primary MD for the patient is No primary care provider on file.  LOS - 1  Chief Complaint  Patient presents with  . Shortness of Breath       Brief Narrative   Howard Bell is a 51 y.o. male with medical history significant of ESRD on dialysis, DM, HTN, really simply moved to [facility] from [facility] area. Patient has established care with a dialysis center but has no PCP, he says he missed his Monday dialysis treatment due to cordate, was admitted with severe shortness of breath due to fluid overload caused by missed dialysis treatment.   Subjective:    Howard Bell today has, No headache, No chest pain, No abdominal pain - No Nausea, No new weakness tingling or numbness, No Cough - Improved SOB.     Assessment  & Plan :     1.Acute hypoxic respiratory failure due to fluid overload from missed dialysis. Fluid removed from dialysis on 08/04/2016, he still 6 kgs above his try weight, will be dialyzed again today and tomorrow, respiratory failure better will try to titrate him off oxygen this evening. Case discussed with nephrologist.  2. Acute on chronic diastolic CHF. No previous echo on file. Due to #1 above, treatment as above. Mildly elevated troponin due to demand ischemia from #1 above, trend was flat and her non-ACS pattern. No further workup. Appreciate cardiology input.  3. Essential hypertension on combination of beta blocker and Norvasc stable.  4. Anemia of chronic disease. Stable hemoglobin now acute  issues.  5. ESRD. On Monday, Wednesday and Friday dialysis schedule. Missed dialysis on Monday due to cordate, counseled on compliance, nephrology on board.    Diet : Diet renal/carb modified with fluid restriction Diet-HS Snack? Nothing; Room service appropriate? Yes; Fluid consistency: Thin    Family Communication  :  None  Code Status :  Full  Disposition Plan  :  Home 08-06-16  After dialysis  Consults  : Nephrology, Cards  Procedures  :    TTE  DVT Prophylaxis  :   Heparin    Lab Results  Component Value Date   PLT 123 (L) 08/05/2016    Inpatient Medications  Scheduled Meds: . amLODipine  10 mg Oral Daily  . brimonidine  1 drop Both Eyes BID  . cinacalcet  90 mg Oral Q supper  . ferric citrate  420 mg Oral TID WC  . heparin  5,000 Units Subcutaneous Q8H  . insulin aspart  0-5 Units Subcutaneous QHS  . insulin aspart  0-9 Units Subcutaneous TID WC  . lisinopril  10 mg Oral Daily  . metoprolol succinate  100 mg Oral Daily  . sodium chloride flush  3 mL Intravenous Q12H   Continuous Infusions: . sodium chloride     PRN Meds:.sodium chloride, acetaminophen **OR** acetaminophen, hydrALAZINE, ondansetron **OR** ondansetron (ZOFRAN) IV, sodium chloride flush  Antibiotics  :    Anti-infectives    None         Objective:   Vitals:   08/04/16 2003 08/05/16 0422 08/05/16 0800 08/05/16 0955  BP: 136/76 127/69  122/64  Pulse: 86 80  78  Resp: 18 18  18   Temp: 97.7 F (36.5 C) 97.9 F (36.6 C)  97.6 F (36.4 C)  TempSrc: Oral Oral  Oral  SpO2: 97% 98%  97%  Weight: 100.2 kg (220 lb 14.4 oz)     Height:   6' (1.829 m)     Wt Readings from Last 3 Encounters:  08/04/16 100.2 kg (220 lb 14.4 oz)     Intake/Output Summary (Last 24 hours) at 08/05/16 1102 Last data filed at 08/05/16 0845  Gross per 24 hour  Intake              363 ml  Output             2998 ml  Net            -2635 ml     Physical Exam  Awake Alert, Oriented X 3, No new F.N  deficits, Normal affect Kramer.AT,PERRAL Supple Neck,No JVD, No cervical lymphadenopathy appriciated.  Symmetrical Chest wall movement, Good air movement bilaterally, few rales RRR,No Gallops,Rubs or new Murmurs, No Parasternal Heave +ve B.Sounds, Abd Soft, No tenderness, No organomegaly appriciated, No rebound - guarding or rigidity. No Cyanosis, Clubbing or edema, No new Rash or bruise       Data Review:    CBC  Recent Labs Lab 08/04/16 1010 08/05/16 0140  WBC 10.4 7.5  HGB 13.3 12.4*  HCT 40.0 36.5*  PLT 132* 123*  MCV 97.6 96.3  MCH 32.4 32.7  MCHC 33.3 34.0  RDW 14.9 14.8    Chemistries   Recent Labs Lab 08/04/16 1010 08/04/16 1832 08/05/16 0140  NA 138  --  137  K 5.8*  --  4.6  CL 97*  --  96*  CO2 24  --  25  GLUCOSE 191*  --  121*  BUN 112*  --  90*  CREATININE 20.07*  --  17.33*  CALCIUM 9.0  --  8.6*  MG  --  2.7*  --   AST  --   --  16  ALT  --   --  11*  ALKPHOS  --   --  109  BILITOT  --   --  1.2   ------------------------------------------------------------------------------------------------------------------ No results for input(s): CHOL, HDL, LDLCALC, TRIG, CHOLHDL, LDLDIRECT  in the last 72 hours.  No results found for: HGBA1C ------------------------------------------------------------------------------------------------------------------  Recent Labs  08/04/16 1832  TSH 1.679   ------------------------------------------------------------------------------------------------------------------ No results for input(s): VITAMINB12, FOLATE, FERRITIN, TIBC, IRON, RETICCTPCT in the last 72 hours.  Coagulation profile No results for input(s): INR, PROTIME in the last 168 hours.  No results for input(s): DDIMER in the last 72 hours.  Cardiac Enzymes  Recent Labs Lab 08/04/16 1832 08/05/16 0010  TROPONINI 0.10* 0.10*   ------------------------------------------------------------------------------------------------------------------      Component Value Date/Time   BNP 1,811.8 (H) 08/04/2016 1832    Micro Results Recent Results (from the past 240 hour(s))  MRSA PCR Screening     Status: Abnormal   Collection Time: 08/04/16  8:42 PM  Result Value Ref Range Status   MRSA by PCR (A) NEGATIVE Final    INVALID, UNABLE TO DETERMINE THE PRESENCE OF TARGET DNA DUE TO SPECIMEN INTEGRITY. RECOLLECTION REQUESTED.    Comment: C.MARSHALL AT 0124 BY L.PITT 08/05/16.        The GeneXpert MRSA Assay (FDA approved for NASAL specimens only), is one component of a comprehensive MRSA colonization surveillance program. It is not intended to diagnose MRSA infection nor to guide or monitor treatment for MRSA infections.   MRSA culture     Status: None (Preliminary result)   Collection Time: 08/04/16  8:42 PM  Result Value Ref Range Status   Specimen Description NOSE  Final   Special Requests NONE  Final   Culture TOO YOUNG TO READ  Final   Report Status PENDING  Incomplete    Radiology Reports Dg Chest Port 1 View  Result Date: 08/04/2016 CLINICAL DATA:  Shortness of breath. EXAM: PORTABLE CHEST 1 VIEW COMPARISON:  No recent prior. FINDINGS: Cardiomegaly. Bilateral pulmonary infiltrates, particular prominent on the right are noted. Findings consistent with asymmetric pulmonary edema and/or pneumonia. No pleural effusion or pneumothorax. IMPRESSION: 1. Cardiomegaly. 2. Diffuse bilateral pulmonary infiltrates, particularly prominent on the right. Findings consist with asymmetric pulmonary edema and/or pneumonia. Electronically Signed   By: Marcello Moores  Register   On: 08/04/2016 10:30    Time Spent in minutes  30   Lala Lund M.D on 08/05/2016 at 11:02 AM  Between 7am to 7pm - Pager - [phone] ( page via [email or web address], text pages only, please mention full 10 digit call back number). After 7pm go to [email or web address] - password [email or web address]

## 2016-08-05 NOTE — Progress Notes (Signed)
Patient doesn't complain of any chest pain and says he is feeling fine.  Encouraged him to notify the nurse whenever he is having chest pain or any uneasiness.  Gave report to the oncoming nurse and made her aware of the situation, will continue to monitor.

## 2016-08-05 NOTE — Progress Notes (Signed)
[facility] Progress Note   Subjective:  Sitting up in bed. Denies dyspnea currently, but still wearing oxygen. + mild chest discomfort. No N/V/D. Still ~6kg above EDW per weights.  Objective Vitals:   08/04/16 2003 08/05/16 0422 08/05/16 0800 08/05/16 0955  BP: 136/76 127/69  122/64  Pulse: 86 80  78  Resp: 18 18  18   Temp: 97.7 F (36.5 C) 97.9 F (36.6 C)  97.6 F (36.4 C)  TempSrc: Oral Oral  Oral  SpO2: 97% 98%  97%  Weight: 100.2 kg (220 lb 14.4 oz)     Height:   6' (1.829 m)    Physical Exam General: Well developed, well nourished, NAD, on nasal oxygen. Head: Normocephalic, atraumatic. + facial edema..  Lungs: Bibasilar rales. Clear in upper lobes. Heart: RRR with normal S1, S2. No murmurs, rubs, or gallops appreciated. Abdomen: Soft, non-tender. Lower extremities: No LE edema.  Dialysis Access: LUE AVG + bruit/thrill.  Additional Objective Labs: Basic Metabolic Panel:  Recent Labs Lab 08/04/16 1010 08/04/16 1832 08/05/16 0140  NA 138  --  137  K 5.8*  --  4.6  CL 97*  --  96*  CO2 24  --  25  GLUCOSE 191*  --  121*  BUN 112*  --  90*  CREATININE 20.07*  --  17.33*  CALCIUM 9.0  --  8.6*  PHOS  --  6.7*  --    Liver Function Tests:  Recent Labs Lab 08/05/16 0140  AST 16  ALT 11*  ALKPHOS 109  BILITOT 1.2  PROT 6.8  ALBUMIN 3.2*   CBC:  Recent Labs Lab 08/04/16 1010 08/05/16 0140  WBC 10.4 7.5  HGB 13.3 12.4*  HCT 40.0 36.5*  MCV 97.6 96.3  PLT 132* 123*   Blood Culture    Component Value Date/Time   SDES NOSE 08/04/2016 2042   SPECREQUEST NONE 08/04/2016 2042   CULT TOO YOUNG TO READ 08/04/2016 2042   REPTSTATUS PENDING 08/04/2016 2042   Cardiac Enzymes:  Recent Labs Lab 08/04/16 1832 08/05/16 0010  TROPONINI 0.10* 0.10*   CBG:  Recent Labs Lab 08/04/16 1648 08/04/16 2002 08/05/16 0732 08/05/16 1118  GLUCAP 163* 180* 157* 203*   Studies/Results: Dg Chest Port 1 View  Result Date:  08/04/2016 CLINICAL DATA:  Shortness of breath. EXAM: PORTABLE CHEST 1 VIEW COMPARISON:  No recent prior. FINDINGS: Cardiomegaly. Bilateral pulmonary infiltrates, particular prominent on the right are noted. Findings consistent with asymmetric pulmonary edema and/or pneumonia. No pleural effusion or pneumothorax. IMPRESSION: 1. Cardiomegaly. 2. Diffuse bilateral pulmonary infiltrates, particularly prominent on the right. Findings consist with asymmetric pulmonary edema and/or pneumonia. Electronically Signed   By: Marcello Moores  Register   On: 08/04/2016 10:30   Medications: . sodium chloride     . amLODipine  10 mg Oral Daily  . brimonidine  1 drop Both Eyes BID  . cinacalcet  90 mg Oral Q supper  . ferric citrate  420 mg Oral TID WC  . heparin  5,000 Units Subcutaneous Q8H  . insulin aspart  0-5 Units Subcutaneous QHS  . insulin aspart  0-9 Units Subcutaneous TID WC  . lisinopril  10 mg Oral Daily  . metoprolol succinate  100 mg Oral Daily  . sodium chloride flush  3 mL Intravenous Q12H    Dialysis Orders: MWF at [facility] 4:15hr, BFR 450, DFRA2.0, EDW 94kg, 2K/2.25Ca bath, AVG, Heparin 9K bolus - No VDRA as outpt d/t high Ca - BMM meds: Lorin Picket  2/meals, Sensipar 90mg  daily  Assessment/Plan: 1.  Acute Respiratory Failure: S/p urgent HD 5/16. Still ~6kg above EDW. For another HD today. 2.  ESRD: Usual MWF schedule. Extra HD today for volume removal. 3.  Hypertension/volume: BP better, but + volume excess. Another 4-5L goal today. 4.  Anemia: Hgb 12.4. No need for ESA. 5.  Metabolic bone disease: Ca ok, Phos slightly high. Continue Auryxia/Sensipar. 6.  DM: on insulin, per primary.  Veneta Penton, PA-C 08/05/2016, 11:24 AM  [facility] Pager: [phone]  Pt seen, examined and agree w A/P as above. D/W primary team, desat's off of nasal O2 , still up >6kg, plan HD today and tomorrow, get vol down.   Kelly Splinter MD [employer] pager  [phone]   08/05/2016, 12:31 PM

## 2016-08-05 NOTE — Progress Notes (Signed)
Pt c/o of sharp chest pain after HD tx; administered nitro 5mg  first dose  Administered at [employer] [facility]

## 2016-08-05 NOTE — Progress Notes (Signed)
After the first Nitro 5mg  sublingual pt states the chest pain has resolved and dose not want the second dose. Gave report to Alden Server, RN. EKG also done on the HD unit.

## 2016-08-06 ENCOUNTER — Inpatient Hospital Stay (HOSPITAL_COMMUNITY): Payer: Medicare Other

## 2016-08-06 DIAGNOSIS — I509 Heart failure, unspecified: Secondary | ICD-10-CM

## 2016-08-06 LAB — TROPONIN I
Troponin I: 0.06 ng/mL (ref <0.03)
Troponin I: 0.06 ng/mL (ref <0.03)

## 2016-08-06 LAB — BASIC METABOLIC PANEL
Anion gap: 16 — ABNORMAL HIGH (ref 5–15)
BUN: 65 mg/dL — ABNORMAL HIGH (ref 6–20)
CO2: 23 mmol/L (ref 22–32)
Calcium: 7.5 mg/dL — ABNORMAL LOW (ref 8.9–10.3)
Chloride: 93 mmol/L — ABNORMAL LOW (ref 101–111)
Creatinine, Ser: 14.21 mg/dL — ABNORMAL HIGH (ref 0.61–1.24)
GFR calc Af Amer: 4 mL/min — ABNORMAL LOW (ref >60)
GFR calc non Af Amer: 3 mL/min — ABNORMAL LOW (ref >60)
Glucose, Bld: 233 mg/dL — ABNORMAL HIGH (ref 65–99)
Potassium: 4 mmol/L (ref 3.5–5.1)
Sodium: 132 mmol/L — ABNORMAL LOW (ref 135–145)

## 2016-08-06 LAB — HEMOGLOBIN A1C
Hgb A1c MFr Bld: 7.9 % — ABNORMAL HIGH (ref 4.8–5.6)
Mean Plasma Glucose: 180 mg/dL

## 2016-08-06 LAB — GLUCOSE, CAPILLARY: Glucose-Capillary: 224 mg/dL — ABNORMAL HIGH (ref 65–99)

## 2016-08-06 MED ORDER — LIDOCAINE HCL (PF) 1 % IJ SOLN: 5.0000 mL | INTRAMUSCULAR | Status: DC | PRN

## 2016-08-06 MED ORDER — ASPIRIN EC 81 MG PO TBEC: 81.0000 mg | DELAYED_RELEASE_TABLET | Freq: Every day | ORAL | 0 refills | Status: DC

## 2016-08-06 MED ORDER — SODIUM CHLORIDE 0.9 % IV SOLN: 100.0000 mL | INTRAVENOUS | Status: DC | PRN

## 2016-08-06 MED ORDER — METOPROLOL SUCCINATE ER 100 MG PO TB24: 100.0000 mg | ORAL_TABLET | Freq: Every day | ORAL | Status: DC

## 2016-08-06 MED ORDER — HEPARIN SODIUM (PORCINE) 1000 UNIT/ML DIALYSIS
5000.0000 [IU] | INTRAMUSCULAR | Status: DC | PRN
  Filled 2016-08-06: qty 5

## 2016-08-06 MED ORDER — HEPARIN SODIUM (PORCINE) 1000 UNIT/ML DIALYSIS: 4500.0000 [IU] | INTRAMUSCULAR | Status: DC | PRN

## 2016-08-06 MED ORDER — LIDOCAINE-PRILOCAINE 2.5-2.5 % EX CREA: 1.0000 "application " | TOPICAL_CREAM | CUTANEOUS | Status: DC | PRN

## 2016-08-06 MED ORDER — HEPARIN SODIUM (PORCINE) 1000 UNIT/ML DIALYSIS: 1000.0000 [IU] | INTRAMUSCULAR | Status: DC | PRN

## 2016-08-06 MED ORDER — ALTEPLASE 2 MG IJ SOLR
2.0000 mg | Freq: Once | INTRAMUSCULAR | Status: DC | PRN
Start: 2016-08-06 — End: 2016-08-06

## 2016-08-06 MED ORDER — PENTAFLUOROPROP-TETRAFLUOROETH EX AERO
1.0000 | INHALATION_SPRAY | CUTANEOUS | Status: DC | PRN
Start: 2016-08-06 — End: 2016-08-06

## 2016-08-06 MED ORDER — ALTEPLASE 2 MG IJ SOLR: 2.0000 mg | Freq: Once | INTRAMUSCULAR | Status: DC | PRN

## 2016-08-06 MED ORDER — HEPARIN SODIUM (PORCINE) 1000 UNIT/ML DIALYSIS: 100.0000 [IU]/kg | INTRAMUSCULAR | Status: DC | PRN

## 2016-08-06 MED ORDER — PENTAFLUOROPROP-TETRAFLUOROETH EX AERO: 1.0000 "application " | INHALATION_SPRAY | CUTANEOUS | Status: DC | PRN

## 2016-08-06 NOTE — Progress Notes (Signed)
Patients B/P is 95/56 and HR is 89. MD notified. Ordered to hold lisinopril, amlodipine, and metoprolol. Orders followed. Will continue to monitor.

## 2016-08-06 NOTE — Progress Notes (Signed)
Patients O2 saturation on RA is 100%. MD notified. Will continue to monitor.

## 2016-08-06 NOTE — Progress Notes (Signed)
[name] to be D/C'd Home per MD order.  Discussed prescriptions and follow up appointments with the patient. Prescriptions given to patient, medication list explained in detail. Pt verbalized understanding.  Allergies as of 08/06/2016   No Known Allergies     Medication List    STOP taking these medications   amLODipine 10 MG tablet Commonly known as:  NORVASC   lisinopril 10 MG tablet Commonly known as:  PRINIVIL,ZESTRIL     TAKE these medications   aspirin EC 81 MG tablet Take 1 tablet (81 mg total) by mouth daily.   AURYXIA 1 GM 210 MG(Fe) tablet Generic drug:  ferric citrate Take 630 mg by mouth 3 (three) times daily with meals.   brimonidine 0.2 % ophthalmic solution Commonly known as:  ALPHAGAN Place 1 drop into both eyes 2 (two) times daily.   cinacalcet 90 MG tablet Commonly known as:  SENSIPAR Take 90 mg by mouth every evening.   HUMALOG KWIKPEN 100 UNIT/ML KiwkPen Generic drug:  insulin lispro Inject 5-10 Units into the skin See admin instructions. Per sliding scale   metoprolol succinate 100 MG 24 hr tablet Commonly known as:  TOPROL-XL Take 1 tablet (100 mg total) by mouth daily. Take with or immediately following a meal. Start taking on:  08/07/2016       Vitals:   08/06/16 1111 08/06/16 1211  BP: 117/80 95/66  Pulse: 89 89  Resp: (!) 22 20  Temp: 97.7 F (36.5 C) 98.3 F (36.8 C)    Skin clean, dry and intact without evidence of skin break down, no evidence of skin tears noted. IV catheter discontinued intact. Site without signs and symptoms of complications. Dressing and pressure applied. Pt denies pain at this time. No complaints noted.  An After Visit Summary was printed and given to the patient. Patient escorted via [place], and D/C home via private auto.  Dixie Dials RN, BSN

## 2016-08-06 NOTE — Discharge Summary (Signed)
Howard Bell TLX:[id number] DOB: 26-Sep-1965 DOA: 08/04/2016  PCP: System, Pcp Not In  Admit date: 08/04/2016  Discharge date: 08/06/2016  Admitted From: Home   Disposition:  Home   Recommendations for Outpatient Follow-up:   Follow up with PCP in 1-2 weeks  PCP Please obtain BMP/CBC, 2 view CXR in 1week,  (see Discharge instructions)   PCP Please follow up on the following pending results: Monitor BP closely   Home Health: None   Equipment/Devices: None  Consultations: Renal  Discharge Condition: Stable   CODE STATUS: Full   Diet Recommendation: Renal-Low Carb with 1.2/day fluid restriction  Chief Complaint  Patient presents with  . Shortness of Breath     Brief history of present illness from the day of admission and additional interim summary    Howard McGalliariais a 51 y.o.malewith medical history significant of ESRD on dialysis, DM, HTN, really simply moved to [facility] from [facility] area. Patient has established care with a dialysis center but has no PCP, he says he missed his Monday dialysis treatment due to cordate, was admitted with severe shortness of breath due to fluid overload caused by missed dialysis treatment.                                                                 Hospital Course     1. Acute hypoxic respiratory failure due to fluid overload from missed dialysis. Fluid removed from dialysis 3 days in a row, he is now down to his dry weight and currently in no respiratory distress, he is off oxygen. Case discussed with nephrologist Dr. Burnett Sheng patient is stable and cleared for home discharge. Counseled on compliance.  2. Acute on chronic nonspecific CHF. No previous echo on file. Due to #1 above, treatment as above. Mildly elevated troponin due to demand ischemia from #1  above, trend was flat and her non-ACS pattern. No further workup. Appreciate cardiology input. Continue beta blocker. Placed on 81 mg of aspirin as well. Will have him follow with cardiology outpatient he was seen by Dr. Einar Gip this admission.  3. Essential hypertension - he was in hypertensive urgency on admission but most likely due to respiratory distress, for the last 2 days his blood pressures have been quite soft and blood pressure medications had to be held, for now I'm discontinuing his ACE inhibitor and Norvasc as blood pressure again is on the softer side, he will commence his beta blocker at home dose from tomorrow, request PCP to monitor his blood pressure closely.  4. Anemia of chronic disease. Stable hemoglobin now acute issues.  5. ESRD. On Monday, Wednesday and Friday dialysis schedule. Missed dialysis on Monday due to court date, counseled on compliance, nephrology was on board.   Discharge diagnosis     Active Problems:   ESRD (  end stage renal disease) ([place])   Acute respiratory failure with hypoxemia (HCC)   Diabetes mellitus with complication (HCC)   Acute CHF (congestive heart failure) ([place])   Hypertensive emergency   Elevated troponin    Discharge instructions    Discharge Instructions    Discharge instructions    Complete by:  As directed    Follow with Primary MD  In in 7 days   Get CBC, CMP, 2 view Chest X ray checked  by Primary MD or SNF MD in 5-7 days ( we routinely change or add medications that can affect your baseline labs and fluid status, therefore we recommend that you get the mentioned basic workup next visit with your PCP, your PCP may decide not to get them or add new tests based on their clinical decision)  Activity: As tolerated with Full fall precautions use walker/cane & assistance as needed  Disposition Home    Diet:  Renal Heart Healthy low Carb with 1.2lit/day fluid restriction.  On your next visit with your primary care physician  please Get Medicines reviewed and adjusted.  Please request your Prim.MD to go over all Hospital Tests and Procedure/Radiological results at the follow up, please get all Hospital records sent to your Prim MD by signing hospital release before you go home.  If you experience worsening of your admission symptoms, develop shortness of breath, life threatening emergency, suicidal or homicidal thoughts you must seek medical attention immediately by calling 911 or calling your MD immediately  if symptoms less severe.  You Must read complete instructions/literature along with all the possible adverse reactions/side effects for all the Medicines you take and that have been prescribed to you. Take any new Medicines after you have completely understood and accpet all the possible adverse reactions/side effects.   Do not drive, operate heavy machinery, perform activities at heights, swimming or participation in water activities or provide baby sitting services if your were admitted for syncope or siezures until you have seen by Primary MD or a Neurologist and advised to do so again.  Do not drive when taking Pain medications.    Do not take more than prescribed Pain, Sleep and Anxiety Medications  Special Instructions: If you have smoked or chewed Tobacco  in the last 2 yrs please stop smoking, stop any regular Alcohol  and or any Recreational drug use.  Wear Seat belts while driving.   Please note  You were cared for by a hospitalist during your hospital stay. If you have any questions about your discharge medications or the care you received while you were in the hospital after you are discharged, you can call the unit and asked to speak with the hospitalist on call if the hospitalist that took care of you is not available. Once you are discharged, your primary care physician will handle any further medical issues. Please note that NO REFILLS for any discharge medications will be authorized once you  are discharged, as it is imperative that you return to your primary care physician (or establish a relationship with a primary care physician if you do not have one) for your aftercare needs so that they can reassess your need for medications and monitor your lab values.   Increase activity slowly    Complete by:  As directed       Discharge Medications   Allergies as of 08/06/2016   No Known Allergies     Medication List    STOP taking these medications  amLODipine 10 MG tablet Commonly known as:  NORVASC   lisinopril 10 MG tablet Commonly known as:  PRINIVIL,ZESTRIL     TAKE these medications   aspirin EC 81 MG tablet Take 1 tablet (81 mg total) by mouth daily.   AURYXIA 1 GM 210 MG(Fe) tablet Generic drug:  ferric citrate Take 630 mg by mouth 3 (three) times daily with meals.   brimonidine 0.2 % ophthalmic solution Commonly known as:  ALPHAGAN Place 1 drop into both eyes 2 (two) times daily.   cinacalcet 90 MG tablet Commonly known as:  SENSIPAR Take 90 mg by mouth every evening.   HUMALOG KWIKPEN 100 UNIT/ML KiwkPen Generic drug:  insulin lispro Inject 5-10 Units into the skin See admin instructions. Per sliding scale   metoprolol succinate 100 MG 24 hr tablet Commonly known as:  TOPROL-XL Take 1 tablet (100 mg total) by mouth daily. Take with or immediately following a meal. Start taking on:  08/07/2016       Follow-up Information    Your PCP. Schedule an appointment as soon as possible for a visit in 1 week(s).        Adrian Prows, MD Follow up.   Specialty:  Cardiology Contact information: [address] [place] [address] [phone]           Major procedures and Radiology Reports - PLEASE review detailed and final reports thoroughly  -        Dg Chest Port 1 View  Result Date: 08/04/2016 CLINICAL DATA:  Shortness of breath. EXAM: PORTABLE CHEST 1 VIEW COMPARISON:  No recent prior. FINDINGS: Cardiomegaly. Bilateral  pulmonary infiltrates, particular prominent on the right are noted. Findings consistent with asymmetric pulmonary edema and/or pneumonia. No pleural effusion or pneumothorax. IMPRESSION: 1. Cardiomegaly. 2. Diffuse bilateral pulmonary infiltrates, particularly prominent on the right. Findings consist with asymmetric pulmonary edema and/or pneumonia. Electronically Signed   By: Marcello Moores  Register   On: 08/04/2016 10:30    Micro Results     Recent Results (from the past 240 hour(s))  MRSA PCR Screening     Status: Abnormal   Collection Time: 08/04/16  8:42 PM  Result Value Ref Range Status   MRSA by PCR (A) NEGATIVE Final    INVALID, UNABLE TO DETERMINE THE PRESENCE OF TARGET DNA DUE TO SPECIMEN INTEGRITY. RECOLLECTION REQUESTED.    Comment: C.MARSHALL AT 0124 BY L.PITT 08/05/16.        The GeneXpert MRSA Assay (FDA approved for NASAL specimens only), is one component of a comprehensive MRSA colonization surveillance program. It is not intended to diagnose MRSA infection nor to guide or monitor treatment for MRSA infections.   MRSA culture     Status: None (Preliminary result)   Collection Time: 08/04/16  8:42 PM  Result Value Ref Range Status   Specimen Description NOSE  Final   Special Requests NONE  Final   Culture TOO YOUNG TO READ  Final   Report Status PENDING  Incomplete  MRSA PCR Screening     Status: None   Collection Time: 08/05/16  8:04 AM  Result Value Ref Range Status   MRSA by PCR NEGATIVE NEGATIVE Final    Comment:        The GeneXpert MRSA Assay (FDA approved for NASAL specimens only), is one component of a comprehensive MRSA colonization surveillance program. It is not intended to diagnose MRSA infection nor to guide or monitor treatment for MRSA infections. DELTA CHECK NOTED  Today   Subjective    Howard Bell today has no headache,no chest abdominal pain,no new weakness tingling or numbness, feels much better wants to go home today.       Objective   Blood pressure 95/66, pulse 89, temperature 98.3 F (36.8 C), temperature source Oral, resp. rate 20, height 6' (1.829 m), weight 94.1 kg (207 lb 7.3 oz), SpO2 99 %.   Intake/Output Summary (Last 24 hours) at 08/06/16 1240 Last data filed at 08/06/16 1230  Gross per 24 hour  Intake              486 ml  Output             8406 ml  Net            -7920 ml    Exam Awake Alert, Oriented x 3, No new F.N deficits, Normal affect [place].AT,PERRAL Supple Neck,No JVD, No cervical lymphadenopathy appriciated.  Symmetrical Chest wall movement, Good air movement bilaterally, CTAB RRR,No Gallops,Rubs or new Murmurs, No Parasternal Heave +ve B.Sounds, Abd Soft, Non tender, No organomegaly appriciated, No rebound -guarding or rigidity. No Cyanosis, Clubbing or edema, No new Rash or bruise   Data Review   CBC w Diff:  Lab Results  Component Value Date   WBC 7.5 08/05/2016   HGB 12.4 (L) 08/05/2016   HCT 36.5 (L) 08/05/2016   PLT 123 (L) 08/05/2016    CMP:  Lab Results  Component Value Date   NA 132 (L) 08/06/2016   K 4.0 08/06/2016   CL 93 (L) 08/06/2016   CO2 23 08/06/2016   BUN 65 (H) 08/06/2016   CREATININE 14.21 (H) 08/06/2016   PROT 6.8 08/05/2016   ALBUMIN 3.2 (L) 08/05/2016   BILITOT 1.2 08/05/2016   ALKPHOS 109 08/05/2016   AST 16 08/05/2016   ALT 11 (L) 08/05/2016  .   Total Time in preparing paper work, data evaluation and todays exam - 73 minutes  Lala Lund M.D on 08/06/2016 at 12:40 PM  [facility]   Office  [phone]

## 2016-08-06 NOTE — Discharge Instructions (Signed)
Follow with Primary MD in 7 days   Get CBC, CMP, 2 view Chest X ray checked  by Primary MD or SNF MD in 5-7 days ( we routinely change or add medications that can affect your baseline labs and fluid status, therefore we recommend that you get the mentioned basic workup next visit with your PCP, your PCP may decide not to get them or add new tests based on their clinical decision)  Activity: As tolerated with Full fall precautions use walker/cane & assistance as needed  Disposition Home    Diet:  Renal-Low Carb with 1.2/day fluid restriction  For Heart failure patients - Check your Weight same time everyday, if you gain over 2 pounds, or you develop in leg swelling, experience more shortness of breath or chest pain, call your Primary MD immediately. Follow Cardiac Low Salt Diet and 1.5 lit/day fluid restriction.  On your next visit with your primary care physician please Get Medicines reviewed and adjusted.  Please request your Prim.MD to go over all Hospital Tests and Procedure/Radiological results at the follow up, please get all Hospital records sent to your Prim MD by signing hospital release before you go home.  If you experience worsening of your admission symptoms, develop shortness of breath, life threatening emergency, suicidal or homicidal thoughts you must seek medical attention immediately by calling 911 or calling your MD immediately  if symptoms less severe.  You Must read complete instructions/literature along with all the possible adverse reactions/side effects for all the Medicines you take and that have been prescribed to you. Take any new Medicines after you have completely understood and accpet all the possible adverse reactions/side effects.   Do not drive, operate heavy machinery, perform activities at heights, swimming or participation in water activities or provide baby sitting services if your were admitted for syncope or siezures until you have seen by Primary MD or a  Neurologist and advised to do so again.  Do not drive when taking Pain medications.    Do not take more than prescribed Pain, Sleep and Anxiety Medications  Special Instructions: If you have smoked or chewed Tobacco  in the last 2 yrs please stop smoking, stop any regular Alcohol  and or any Recreational drug use.  Wear Seat belts while driving.   Please note  You were cared for by a hospitalist during your hospital stay. If you have any questions about your discharge medications or the care you received while you were in the hospital after you are discharged, you can call the unit and asked to speak with the hospitalist on call if the hospitalist that took care of you is not available. Once you are discharged, your primary care physician will handle any further medical issues. Please note that NO REFILLS for any discharge medications will be authorized once you are discharged, as it is imperative that you return to your primary care physician (or establish a relationship with a primary care physician if you do not have one) for your aftercare needs so that they can reassess your need for medications and monitor your lab values.

## 2016-08-06 NOTE — Progress Notes (Signed)
Howard Norse, MD stated that patient was to be D/C'd today and does not require ECHO to be completed prior to D/C, which could be done outpatient. Orders followed. Will continue to monitor.

## 2016-08-06 NOTE — Care Management Note (Signed)
Case Management Note  Patient Details  Name: Howard Bell MRN: [id number] Date of Birth: [birth year]  Subjective/Objective:                 Patient from home w wife admitted for fluid overload after missing OP HD session at [facility] location. HD MWF. Will DC to home with wife. No CM needs identified.    Action/Plan:   Expected Discharge Date:  08/06/16               Expected Discharge Plan:  Home/Self Care  In-House Referral:     Discharge planning Services  CM Consult  Post Acute Care Choice:    Choice offered to:     DME Arranged:    DME Agency:     HH Arranged:    HH Agency:     Status of Service:  Completed, signed off  If discussed at [employer] of Stay Meetings, dates discussed:    Additional Comments:  Carles Collet, RN 08/06/2016, 1:43 PM

## 2016-08-07 LAB — MRSA CULTURE: Culture: NOT DETECTED

## 2016-09-20 DIAGNOSIS — N2581 Secondary hyperparathyroidism of renal origin: Secondary | ICD-10-CM | POA: Diagnosis not present

## 2016-09-20 DIAGNOSIS — E1129 Type 2 diabetes mellitus with other diabetic kidney complication: Secondary | ICD-10-CM | POA: Diagnosis not present

## 2016-09-20 DIAGNOSIS — N186 End stage renal disease: Secondary | ICD-10-CM | POA: Diagnosis not present

## 2016-09-22 DIAGNOSIS — N186 End stage renal disease: Secondary | ICD-10-CM | POA: Diagnosis not present

## 2016-09-22 DIAGNOSIS — N2581 Secondary hyperparathyroidism of renal origin: Secondary | ICD-10-CM | POA: Diagnosis not present

## 2016-09-22 DIAGNOSIS — E1129 Type 2 diabetes mellitus with other diabetic kidney complication: Secondary | ICD-10-CM | POA: Diagnosis not present

## 2016-09-24 DIAGNOSIS — E1129 Type 2 diabetes mellitus with other diabetic kidney complication: Secondary | ICD-10-CM | POA: Diagnosis not present

## 2016-09-24 DIAGNOSIS — N186 End stage renal disease: Secondary | ICD-10-CM | POA: Diagnosis not present

## 2016-09-24 DIAGNOSIS — N2581 Secondary hyperparathyroidism of renal origin: Secondary | ICD-10-CM | POA: Diagnosis not present

## 2016-09-27 DIAGNOSIS — N186 End stage renal disease: Secondary | ICD-10-CM | POA: Diagnosis not present

## 2016-09-27 DIAGNOSIS — N2581 Secondary hyperparathyroidism of renal origin: Secondary | ICD-10-CM | POA: Diagnosis not present

## 2016-09-27 DIAGNOSIS — E1129 Type 2 diabetes mellitus with other diabetic kidney complication: Secondary | ICD-10-CM | POA: Diagnosis not present

## 2016-09-29 DIAGNOSIS — N2581 Secondary hyperparathyroidism of renal origin: Secondary | ICD-10-CM | POA: Diagnosis not present

## 2016-09-29 DIAGNOSIS — N186 End stage renal disease: Secondary | ICD-10-CM | POA: Diagnosis not present

## 2016-09-29 DIAGNOSIS — E1129 Type 2 diabetes mellitus with other diabetic kidney complication: Secondary | ICD-10-CM | POA: Diagnosis not present

## 2016-10-01 DIAGNOSIS — E1129 Type 2 diabetes mellitus with other diabetic kidney complication: Secondary | ICD-10-CM | POA: Diagnosis not present

## 2016-10-01 DIAGNOSIS — N2581 Secondary hyperparathyroidism of renal origin: Secondary | ICD-10-CM | POA: Diagnosis not present

## 2016-10-01 DIAGNOSIS — N186 End stage renal disease: Secondary | ICD-10-CM | POA: Diagnosis not present

## 2016-10-04 DIAGNOSIS — N186 End stage renal disease: Secondary | ICD-10-CM | POA: Diagnosis not present

## 2016-10-04 DIAGNOSIS — E1129 Type 2 diabetes mellitus with other diabetic kidney complication: Secondary | ICD-10-CM | POA: Diagnosis not present

## 2016-10-04 DIAGNOSIS — N2581 Secondary hyperparathyroidism of renal origin: Secondary | ICD-10-CM | POA: Diagnosis not present

## 2016-10-05 DIAGNOSIS — N186 End stage renal disease: Secondary | ICD-10-CM | POA: Diagnosis not present

## 2016-10-05 DIAGNOSIS — E8779 Other fluid overload: Secondary | ICD-10-CM | POA: Diagnosis not present

## 2016-10-05 DIAGNOSIS — N2581 Secondary hyperparathyroidism of renal origin: Secondary | ICD-10-CM | POA: Diagnosis not present

## 2016-10-06 DIAGNOSIS — N186 End stage renal disease: Secondary | ICD-10-CM | POA: Diagnosis not present

## 2016-10-06 DIAGNOSIS — N2581 Secondary hyperparathyroidism of renal origin: Secondary | ICD-10-CM | POA: Diagnosis not present

## 2016-10-06 DIAGNOSIS — E1129 Type 2 diabetes mellitus with other diabetic kidney complication: Secondary | ICD-10-CM | POA: Diagnosis not present

## 2016-10-08 DIAGNOSIS — N2581 Secondary hyperparathyroidism of renal origin: Secondary | ICD-10-CM | POA: Diagnosis not present

## 2016-10-08 DIAGNOSIS — N186 End stage renal disease: Secondary | ICD-10-CM | POA: Diagnosis not present

## 2016-10-08 DIAGNOSIS — E1129 Type 2 diabetes mellitus with other diabetic kidney complication: Secondary | ICD-10-CM | POA: Diagnosis not present

## 2016-10-11 DIAGNOSIS — N186 End stage renal disease: Secondary | ICD-10-CM | POA: Diagnosis not present

## 2016-10-11 DIAGNOSIS — N2581 Secondary hyperparathyroidism of renal origin: Secondary | ICD-10-CM | POA: Diagnosis not present

## 2016-10-11 DIAGNOSIS — E1129 Type 2 diabetes mellitus with other diabetic kidney complication: Secondary | ICD-10-CM | POA: Diagnosis not present

## 2016-10-13 DIAGNOSIS — N2581 Secondary hyperparathyroidism of renal origin: Secondary | ICD-10-CM | POA: Diagnosis not present

## 2016-10-13 DIAGNOSIS — E1129 Type 2 diabetes mellitus with other diabetic kidney complication: Secondary | ICD-10-CM | POA: Diagnosis not present

## 2016-10-13 DIAGNOSIS — N186 End stage renal disease: Secondary | ICD-10-CM | POA: Diagnosis not present

## 2016-10-15 DIAGNOSIS — E1129 Type 2 diabetes mellitus with other diabetic kidney complication: Secondary | ICD-10-CM | POA: Diagnosis not present

## 2016-10-15 DIAGNOSIS — N186 End stage renal disease: Secondary | ICD-10-CM | POA: Diagnosis not present

## 2016-10-15 DIAGNOSIS — N2581 Secondary hyperparathyroidism of renal origin: Secondary | ICD-10-CM | POA: Diagnosis not present

## 2016-10-18 DIAGNOSIS — N2581 Secondary hyperparathyroidism of renal origin: Secondary | ICD-10-CM | POA: Diagnosis not present

## 2016-10-18 DIAGNOSIS — N186 End stage renal disease: Secondary | ICD-10-CM | POA: Diagnosis not present

## 2016-10-18 DIAGNOSIS — E1129 Type 2 diabetes mellitus with other diabetic kidney complication: Secondary | ICD-10-CM | POA: Diagnosis not present

## 2016-10-19 DIAGNOSIS — Z992 Dependence on renal dialysis: Secondary | ICD-10-CM | POA: Diagnosis not present

## 2016-10-19 DIAGNOSIS — E1129 Type 2 diabetes mellitus with other diabetic kidney complication: Secondary | ICD-10-CM | POA: Diagnosis not present

## 2016-10-19 DIAGNOSIS — N186 End stage renal disease: Secondary | ICD-10-CM | POA: Diagnosis not present

## 2016-10-20 DIAGNOSIS — N2581 Secondary hyperparathyroidism of renal origin: Secondary | ICD-10-CM | POA: Diagnosis not present

## 2016-10-20 DIAGNOSIS — N186 End stage renal disease: Secondary | ICD-10-CM | POA: Diagnosis not present

## 2016-10-20 DIAGNOSIS — E1129 Type 2 diabetes mellitus with other diabetic kidney complication: Secondary | ICD-10-CM | POA: Diagnosis not present

## 2016-10-22 DIAGNOSIS — N186 End stage renal disease: Secondary | ICD-10-CM | POA: Diagnosis not present

## 2016-10-22 DIAGNOSIS — N2581 Secondary hyperparathyroidism of renal origin: Secondary | ICD-10-CM | POA: Diagnosis not present

## 2016-10-25 DIAGNOSIS — N186 End stage renal disease: Secondary | ICD-10-CM | POA: Diagnosis not present

## 2016-10-25 DIAGNOSIS — N2581 Secondary hyperparathyroidism of renal origin: Secondary | ICD-10-CM | POA: Diagnosis not present

## 2016-10-27 DIAGNOSIS — E1129 Type 2 diabetes mellitus with other diabetic kidney complication: Secondary | ICD-10-CM | POA: Diagnosis not present

## 2016-10-27 DIAGNOSIS — N2581 Secondary hyperparathyroidism of renal origin: Secondary | ICD-10-CM | POA: Diagnosis not present

## 2016-10-27 DIAGNOSIS — N186 End stage renal disease: Secondary | ICD-10-CM | POA: Diagnosis not present

## 2016-10-29 DIAGNOSIS — E1129 Type 2 diabetes mellitus with other diabetic kidney complication: Secondary | ICD-10-CM | POA: Diagnosis not present

## 2016-10-29 DIAGNOSIS — N2581 Secondary hyperparathyroidism of renal origin: Secondary | ICD-10-CM | POA: Diagnosis not present

## 2016-10-29 DIAGNOSIS — N186 End stage renal disease: Secondary | ICD-10-CM | POA: Diagnosis not present

## 2016-11-01 DIAGNOSIS — N186 End stage renal disease: Secondary | ICD-10-CM | POA: Diagnosis not present

## 2016-11-01 DIAGNOSIS — E1129 Type 2 diabetes mellitus with other diabetic kidney complication: Secondary | ICD-10-CM | POA: Diagnosis not present

## 2016-11-01 DIAGNOSIS — N2581 Secondary hyperparathyroidism of renal origin: Secondary | ICD-10-CM | POA: Diagnosis not present

## 2016-11-03 DIAGNOSIS — E1129 Type 2 diabetes mellitus with other diabetic kidney complication: Secondary | ICD-10-CM | POA: Diagnosis not present

## 2016-11-03 DIAGNOSIS — N186 End stage renal disease: Secondary | ICD-10-CM | POA: Diagnosis not present

## 2016-11-03 DIAGNOSIS — N2581 Secondary hyperparathyroidism of renal origin: Secondary | ICD-10-CM | POA: Diagnosis not present

## 2016-11-05 DIAGNOSIS — N186 End stage renal disease: Secondary | ICD-10-CM | POA: Diagnosis not present

## 2016-11-05 DIAGNOSIS — N2581 Secondary hyperparathyroidism of renal origin: Secondary | ICD-10-CM | POA: Diagnosis not present

## 2016-11-05 DIAGNOSIS — E1129 Type 2 diabetes mellitus with other diabetic kidney complication: Secondary | ICD-10-CM | POA: Diagnosis not present

## 2016-11-08 DIAGNOSIS — E1129 Type 2 diabetes mellitus with other diabetic kidney complication: Secondary | ICD-10-CM | POA: Diagnosis not present

## 2016-11-08 DIAGNOSIS — N2581 Secondary hyperparathyroidism of renal origin: Secondary | ICD-10-CM | POA: Diagnosis not present

## 2016-11-08 DIAGNOSIS — N186 End stage renal disease: Secondary | ICD-10-CM | POA: Diagnosis not present

## 2016-11-09 ENCOUNTER — Ambulatory Visit: Payer: Self-pay | Admitting: Family Medicine

## 2016-11-10 DIAGNOSIS — E1129 Type 2 diabetes mellitus with other diabetic kidney complication: Secondary | ICD-10-CM | POA: Diagnosis not present

## 2016-11-10 DIAGNOSIS — N2581 Secondary hyperparathyroidism of renal origin: Secondary | ICD-10-CM | POA: Diagnosis not present

## 2016-11-10 DIAGNOSIS — N186 End stage renal disease: Secondary | ICD-10-CM | POA: Diagnosis not present

## 2016-11-11 DIAGNOSIS — I871 Compression of vein: Secondary | ICD-10-CM | POA: Diagnosis not present

## 2016-11-11 DIAGNOSIS — T82858A Stenosis of vascular prosthetic devices, implants and grafts, initial encounter: Secondary | ICD-10-CM | POA: Diagnosis not present

## 2016-11-11 DIAGNOSIS — N186 End stage renal disease: Secondary | ICD-10-CM | POA: Diagnosis not present

## 2016-11-11 DIAGNOSIS — Z992 Dependence on renal dialysis: Secondary | ICD-10-CM | POA: Diagnosis not present

## 2016-11-12 DIAGNOSIS — N2581 Secondary hyperparathyroidism of renal origin: Secondary | ICD-10-CM | POA: Diagnosis not present

## 2016-11-12 DIAGNOSIS — E1129 Type 2 diabetes mellitus with other diabetic kidney complication: Secondary | ICD-10-CM | POA: Diagnosis not present

## 2016-11-12 DIAGNOSIS — N186 End stage renal disease: Secondary | ICD-10-CM | POA: Diagnosis not present

## 2016-11-15 DIAGNOSIS — N2581 Secondary hyperparathyroidism of renal origin: Secondary | ICD-10-CM | POA: Diagnosis not present

## 2016-11-15 DIAGNOSIS — E1129 Type 2 diabetes mellitus with other diabetic kidney complication: Secondary | ICD-10-CM | POA: Diagnosis not present

## 2016-11-15 DIAGNOSIS — N186 End stage renal disease: Secondary | ICD-10-CM | POA: Diagnosis not present

## 2016-11-17 DIAGNOSIS — N186 End stage renal disease: Secondary | ICD-10-CM | POA: Insufficient documentation

## 2016-11-17 DIAGNOSIS — Z79899 Other long term (current) drug therapy: Secondary | ICD-10-CM | POA: Diagnosis not present

## 2016-11-17 DIAGNOSIS — R339 Retention of urine, unspecified: Secondary | ICD-10-CM | POA: Diagnosis present

## 2016-11-17 DIAGNOSIS — E1129 Type 2 diabetes mellitus with other diabetic kidney complication: Secondary | ICD-10-CM | POA: Diagnosis not present

## 2016-11-17 DIAGNOSIS — E119 Type 2 diabetes mellitus without complications: Secondary | ICD-10-CM | POA: Insufficient documentation

## 2016-11-17 DIAGNOSIS — Z7982 Long term (current) use of aspirin: Secondary | ICD-10-CM | POA: Insufficient documentation

## 2016-11-17 DIAGNOSIS — Z992 Dependence on renal dialysis: Secondary | ICD-10-CM | POA: Insufficient documentation

## 2016-11-17 DIAGNOSIS — I509 Heart failure, unspecified: Secondary | ICD-10-CM | POA: Diagnosis not present

## 2016-11-17 DIAGNOSIS — N398 Other specified disorders of urinary system: Secondary | ICD-10-CM | POA: Insufficient documentation

## 2016-11-17 DIAGNOSIS — Z794 Long term (current) use of insulin: Secondary | ICD-10-CM | POA: Diagnosis not present

## 2016-11-17 DIAGNOSIS — I132 Hypertensive heart and chronic kidney disease with heart failure and with stage 5 chronic kidney disease, or end stage renal disease: Secondary | ICD-10-CM | POA: Diagnosis not present

## 2016-11-17 DIAGNOSIS — N399 Disorder of urinary system, unspecified: Secondary | ICD-10-CM | POA: Diagnosis not present

## 2016-11-17 DIAGNOSIS — N2581 Secondary hyperparathyroidism of renal origin: Secondary | ICD-10-CM | POA: Diagnosis not present

## 2016-11-18 ENCOUNTER — Emergency Department (HOSPITAL_COMMUNITY)
Admission: EM | Admit: 2016-11-18 | Discharge: 2016-11-19 | Disposition: A | Discharge status: Home / Self Care | Payer: Medicare Other | Attending: Emergency Medicine | Admitting: Emergency Medicine

## 2016-11-18 ENCOUNTER — Encounter (HOSPITAL_COMMUNITY): Payer: Self-pay

## 2016-11-18 ENCOUNTER — Emergency Department (HOSPITAL_COMMUNITY): Payer: Medicare Other

## 2016-11-18 ENCOUNTER — Emergency Department (HOSPITAL_COMMUNITY)
Admission: EM | Admit: 2016-11-18 | Discharge: 2016-11-18 | Disposition: A | Discharge status: Home / Self Care | Payer: Medicare Other | Attending: Emergency Medicine | Admitting: Emergency Medicine

## 2016-11-18 ENCOUNTER — Encounter (HOSPITAL_COMMUNITY): Payer: Self-pay | Admitting: Emergency Medicine

## 2016-11-18 DIAGNOSIS — E119 Type 2 diabetes mellitus without complications: Secondary | ICD-10-CM | POA: Insufficient documentation

## 2016-11-18 DIAGNOSIS — R748 Abnormal levels of other serum enzymes: Secondary | ICD-10-CM | POA: Diagnosis not present

## 2016-11-18 DIAGNOSIS — R1030 Lower abdominal pain, unspecified: Secondary | ICD-10-CM | POA: Diagnosis not present

## 2016-11-18 DIAGNOSIS — Z79899 Other long term (current) drug therapy: Secondary | ICD-10-CM | POA: Diagnosis not present

## 2016-11-18 DIAGNOSIS — R39198 Other difficulties with micturition: Secondary | ICD-10-CM

## 2016-11-18 DIAGNOSIS — N186 End stage renal disease: Secondary | ICD-10-CM | POA: Insufficient documentation

## 2016-11-18 DIAGNOSIS — I110 Hypertensive heart disease with heart failure: Secondary | ICD-10-CM | POA: Insufficient documentation

## 2016-11-18 DIAGNOSIS — N410 Acute prostatitis: Secondary | ICD-10-CM | POA: Diagnosis not present

## 2016-11-18 DIAGNOSIS — R140 Abdominal distension (gaseous): Secondary | ICD-10-CM | POA: Insufficient documentation

## 2016-11-18 DIAGNOSIS — I509 Heart failure, unspecified: Secondary | ICD-10-CM | POA: Diagnosis not present

## 2016-11-18 DIAGNOSIS — R339 Retention of urine, unspecified: Secondary | ICD-10-CM | POA: Diagnosis not present

## 2016-11-18 DIAGNOSIS — Z794 Long term (current) use of insulin: Secondary | ICD-10-CM | POA: Insufficient documentation

## 2016-11-18 LAB — COMPREHENSIVE METABOLIC PANEL
ALT: 18 U/L (ref 17–63)
AST: 25 U/L (ref 15–41)
Albumin: 3.9 g/dL (ref 3.5–5.0)
Alkaline Phosphatase: 154 U/L — ABNORMAL HIGH (ref 38–126)
Anion gap: 14 (ref 5–15)
BUN: 48 mg/dL — ABNORMAL HIGH (ref 6–20)
CO2: 28 mmol/L (ref 22–32)
Calcium: 7.3 mg/dL — ABNORMAL LOW (ref 8.9–10.3)
Chloride: 93 mmol/L — ABNORMAL LOW (ref 101–111)
Creatinine, Ser: 11.29 mg/dL — ABNORMAL HIGH (ref 0.61–1.24)
GFR calc Af Amer: 5 mL/min — ABNORMAL LOW (ref >60)
GFR calc non Af Amer: 5 mL/min — ABNORMAL LOW (ref >60)
Glucose, Bld: 114 mg/dL — ABNORMAL HIGH (ref 65–99)
Potassium: 3.9 mmol/L (ref 3.5–5.1)
Sodium: 135 mmol/L (ref 135–145)
Total Bilirubin: 0.6 mg/dL (ref 0.3–1.2)
Total Protein: 8.4 g/dL — ABNORMAL HIGH (ref 6.5–8.1)

## 2016-11-18 LAB — CBC WITH DIFFERENTIAL/PLATELET
Basophils Absolute: 0 10*3/uL (ref 0.0–0.1)
Basophils Relative: 1 %
Eosinophils Absolute: 0.2 10*3/uL (ref 0.0–0.7)
Eosinophils Relative: 3 %
HCT: 40.2 % (ref 39.0–52.0)
Hemoglobin: 13.7 g/dL (ref 13.0–17.0)
Lymphocytes Relative: 24 %
Lymphs Abs: 2 10*3/uL (ref 0.7–4.0)
MCH: 31.8 pg (ref 26.0–34.0)
MCHC: 34.1 g/dL (ref 30.0–36.0)
MCV: 93.3 fL (ref 78.0–100.0)
Monocytes Absolute: 0.9 10*3/uL (ref 0.1–1.0)
Monocytes Relative: 11 %
Neutro Abs: 5.2 10*3/uL (ref 1.7–7.7)
Neutrophils Relative %: 61 %
Platelets: 141 10*3/uL — ABNORMAL LOW (ref 150–400)
RBC: 4.31 MIL/uL (ref 4.22–5.81)
RDW: 14.4 % (ref 11.5–15.5)
WBC: 8.3 10*3/uL (ref 4.0–10.5)

## 2016-11-18 LAB — LIPASE, BLOOD: Lipase: 274 U/L — ABNORMAL HIGH (ref 11–51)

## 2016-11-18 LAB — I-STAT CG4 LACTIC ACID, ED: Lactic Acid, Venous: 1.19 mmol/L (ref 0.5–1.9)

## 2016-11-18 MED ORDER — OXYCODONE HCL 5 MG PO TABS: 5.0000 mg | ORAL_TABLET | Freq: Four times a day (QID) | ORAL | 0 refills | Status: DC | PRN

## 2016-11-18 MED ORDER — HYDROMORPHONE HCL 1 MG/ML IJ SOLN
1.0000 mg | Freq: Once | INTRAMUSCULAR | Status: AC
  Administered 2016-11-18: 1 mg via INTRAVENOUS
  Filled 2016-11-18: qty 1

## 2016-11-18 MED ORDER — CIPROFLOXACIN HCL 500 MG PO TABS: 500.0000 mg | ORAL_TABLET | ORAL | 0 refills | Status: AC

## 2016-11-18 NOTE — ED Notes (Signed)
In and out cath performed with no urine output , PA notified .

## 2016-11-18 NOTE — Discharge Instructions (Signed)
You do not appear to have made any urine. You may be having bladder spasms. It may be helpful to take a warm bath or apply heating pad to your lower abdomen.If you do produce urine collect it so that it can be tested for infection.  Follow up with [facility] or a primary care doctor. Return for any new symptoms like fever or chills

## 2016-11-18 NOTE — ED Triage Notes (Signed)
Patient reports urinary retention since Monday. Reports that he is dialysis patient but still normally urinates daily. Patient reports that he was seen earlier today at [facility] and had foley put in and not urine returned, so they removed catheter and sent patient home.  Patient reports that she scanned his bladder and reports that had urine in it.

## 2016-11-18 NOTE — ED Provider Notes (Signed)
[name] DEPT Provider Note   CSN: [id number] Arrival date & time: 11/18/16  1614     History   Chief Complaint Chief Complaint  Patient presents with  . Urinary Retention    HPI [facility] is a 51 y.o. male.  HPI   51 year old male with past medical history of end-stage renal disease, CHF, hypertension, who presents with lower abdominal pain. The patient states that he normally urinates once a day. He has not urinated for the last several days. He has had associated lower abdominal fullness. He was seen at [facility] last night, and a Foley was placed. They did not have any return of urine. He was sent home with likely decreased urine production related to his end-stage renal disease. States that since then, he has had progressively worsening aching, throbbing lower abdominal pain. He's also had mild nausea and generalized abdominal pain. Denies any fevers. No flank pain. Currently endorses a dull, aching, suprapubic pain.  Past Medical History:  Diagnosis Date  . Congestive heart failure (CHF) ([place])   . 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 ESRD (end stage renal disease) on dialysis ([place])   . Hypertension     Patient Active Problem List   Diagnosis Date Noted  . ESRD (end stage renal disease) ([place]) 08/04/2016  . Acute respiratory failure with hypoxemia ([place]) 08/04/2016  . Diabetes mellitus with complication ([place]) 20/94/7096  . Acute CHF (congestive heart failure) ([place]) 08/04/2016  . Hypertensive emergency 08/04/2016  . Elevated troponin     Past Surgical History:  Procedure Laterality Date  . HERNIA REPAIR         Home Medications    Prior to Admission medications   Medication Sig Start Date End Date Taking? Authorizing Provider  brimonidine (ALPHAGAN) 0.2 % ophthalmic solution Place 1 drop into both eyes 2 (two) times daily.   Yes [provider]  cinacalcet (SENSIPAR) 90 MG tablet Take 90 mg by mouth every  evening.    Yes [provider]  timolol (BETIMOL) 0.5 % ophthalmic solution Place 1 drop into both eyes 2 (two) times daily.   Yes [provider]  travoprost, benzalkonium, (TRAVATAN) 0.004 % ophthalmic solution Place 1 drop into both eyes at bedtime.   Yes [provider]  amLODipine (NORVASC) 10 MG tablet Take 10 mg by mouth daily.     [provider]  aspirin EC 81 MG tablet Take 1 tablet (81 mg total) by mouth daily. Patient not taking: Reported on 11/18/2016 08/06/16   Thurnell Lose, MD  ciprofloxacin (CIPRO) 500 MG tablet Take 1 tablet (500 mg total) by mouth daily. Take after dialysis on dialysis days 11/18/16 11/28/16  Duffy Bruce, MD  insulin lispro (HUMALOG KWIKPEN) 100 UNIT/ML KiwkPen Inject 5-10 Units into the skin See admin instructions. Per sliding scale    [provider]  metoprolol succinate (TOPROL-XL) 100 MG 24 hr tablet Take 1 tablet (100 mg total) by mouth daily. Take with or immediately following a meal. 08/07/16   Thurnell Lose, MD  oxyCODONE (ROXICODONE) 5 MG immediate release tablet Take 1 tablet (5 mg total) by mouth every 6 (six) hours as needed for severe pain. 11/18/16   Duffy Bruce, MD    Family History Family History  Problem Relation Age of Onset  . Diabetes Mother   . Hypertension Father     Social History Social History  Substance Use Topics  . Smoking status: Never Smoker  .  Smokeless tobacco: Never Used  . Alcohol use No     Allergies   Patient has no known allergies.   Review of Systems Review of Systems  Gastrointestinal: Positive for abdominal pain and nausea.  Genitourinary: Positive for decreased urine volume.  All other systems reviewed and are negative.    Physical Exam Updated Vital Signs BP 138/78 (BP Location: Right Arm)   Pulse 88   Temp 98.8 F (37.1 C) (Oral)   Resp 18   Ht 6' (1.829 m)   Wt 96.2 kg (212 lb)   SpO2 96%   BMI 28.75 kg/m   Physical Exam    Constitutional: He is oriented to person, place, and time. He appears well-developed and well-nourished. No distress.  HENT:  Head: Normocephalic and atraumatic.  Eyes: Conjunctivae are normal.  Neck: Neck supple.  Cardiovascular: Normal rate, regular rhythm and normal heart sounds.  Exam reveals no friction rub.   No murmur heard. Pulmonary/Chest: Effort normal and breath sounds normal. No respiratory distress. He has no wheezes. He has no rales.  Abdominal: Soft. Bowel sounds are normal. He exhibits no distension. There is tenderness (mild, suprapubic). There is no rebound and no guarding.  Genitourinary:  Genitourinary Comments: Prostate symmetrically enlarged, tender but not boggy  Musculoskeletal: He exhibits no edema.  Neurological: He is alert and oriented to person, place, and time. He exhibits normal muscle tone.  Skin: Skin is warm. Capillary refill takes less than 2 seconds.  Psychiatric: He has a normal mood and affect.  Nursing note and vitals reviewed.    ED Treatments / Results  Labs (all labs ordered are listed, but only abnormal results are displayed) Labs Reviewed  URINALYSIS, ROUTINE W REFLEX MICROSCOPIC - Abnormal; Notable for the following:       Result Value   Color, Urine RED (*)    APPearance TURBID (*)    Glucose, UA   (*)    Value: TEST NOT REPORTED DUE TO COLOR INTERFERENCE OF URINE PIGMENT   Hgb urine dipstick   (*)    Value: TEST NOT REPORTED DUE TO COLOR INTERFERENCE OF URINE PIGMENT   Bilirubin Urine   (*)    Value: TEST NOT REPORTED DUE TO COLOR INTERFERENCE OF URINE PIGMENT   Ketones, ur   (*)    Value: TEST NOT REPORTED DUE TO COLOR INTERFERENCE OF URINE PIGMENT   Protein, ur   (*)    Value: TEST NOT REPORTED DUE TO COLOR INTERFERENCE OF URINE PIGMENT   Nitrite   (*)    Value: TEST NOT REPORTED DUE TO COLOR INTERFERENCE OF URINE PIGMENT   Leukocytes, UA   (*)    Value: TEST NOT REPORTED DUE TO COLOR INTERFERENCE OF URINE PIGMENT   All other  components within normal limits  CBC WITH DIFFERENTIAL/PLATELET - Abnormal; Notable for the following:    Platelets 141 (*)    All other components within normal limits  COMPREHENSIVE METABOLIC PANEL - Abnormal; Notable for the following:    Chloride 93 (*)    Glucose, Bld 114 (*)    BUN 48 (*)    Creatinine, Ser 11.29 (*)    Calcium 7.3 (*)    Total Protein 8.4 (*)    Alkaline Phosphatase 154 (*)    GFR calc non Af Amer 5 (*)    GFR calc Af Amer 5 (*)    All other components within normal limits  LIPASE, BLOOD - Abnormal; Notable for the following:    Lipase  274 (*)    All other components within normal limits  URINALYSIS, MICROSCOPIC (REFLEX) - Abnormal; Notable for the following:    Bacteria, UA FIELD OBSCURED BY RBC'S (*)    Squamous Epithelial / LPF FIELD OBSCURED BY RBC'S (*)    All other components within normal limits  URINE CULTURE  I-STAT CG4 LACTIC ACID, ED    EKG  EKG Interpretation None       Radiology Ct Abdomen Pelvis Wo Contrast  Result Date: 11/18/2016 CLINICAL DATA:  Urinary retention since Monday. Abdominal distention. EXAM: CT ABDOMEN AND PELVIS WITHOUT CONTRAST TECHNIQUE: Multidetector CT imaging of the abdomen and pelvis was performed following the standard protocol without IV contrast. COMPARISON:  None. FINDINGS: Lower chest: Cardiomegaly. No pericardial effusion. Patchy ground-glass opacities with areas of lobular sparing likely related to mild pulmonary edema. No acute pneumonic consolidation, effusion or pneumothorax. Atelectasis is seen at the right lung base. Hepatobiliary: No focal liver abnormality is seen. No gallstones, gallbladder wall thickening, or biliary dilatation. Pancreas: Unremarkable. No pancreatic ductal dilatation or surrounding inflammatory changes. Spleen: Normal in size without focal abnormality. Adrenals/Urinary Tract: 15 mm hypodense left adrenal nodule consistent with a benign adenoma with noncontrast Hounsfield unit of 27. Normal  right adrenal gland. Tiny too small to characterize hypodensities within both kidneys consistent with cysts. No obstructive uropathy. No hydroureteronephrosis. The bladder contains a small amount of urine but is largely decompressed. Stomach/Bowel: Stomach is within normal limits. Appendix appears normal. No evidence of bowel wall thickening, distention, or inflammatory changes. Scattered colonic diverticulosis without acute diverticulitis. Vascular/Lymphatic: Extensive aortoiliac and branch vessel atherosclerosis. No aneurysm or adenopathy. Reproductive: Prostate is unremarkable. Other: Bilateral fat containing inguinal hernias. No abdominopelvic ascites. 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 IMPRESSION: 1. A small amount of urine is seen within the decompressed bladder. No obstructive uropathy or nephrolithiasis is noted. Tiny too small to characterize hypodensities within both kidneys statistically consistent with cysts. 2. Extensive aortoiliac and branch vessel atherosclerosis. 3. Diffuse sclerotic appearance the dorsal spine may be seen with renal osteodystrophy. 4. Cardiomegaly. 5. Diffuse ground-glass opacities with lobular spurring likely to represent stigmata of noncardiogenic pulmonary edema. 6. Bilateral fat containing inguinal hernias. Electronically Signed   By: Ashley Royalty M.D.   On: 11/18/2016 22:34    Procedures Procedures (including critical care time)  Medications Ordered in ED Medications  HYDROmorphone (DILAUDID) injection 1 mg (1 mg Intravenous Given 11/18/16 2156)     Initial Impression / Assessment and Plan / ED Course  I have reviewed the triage vital signs and the nursing notes.  Pertinent labs & imaging results that were available during my care of the patient were reviewed by me and considered in my medical decision making (see chart for details).     51 yo M with ESRD here with ongoing lower abdominal pain,  reported decreased UOP though only urinates once every 24-48 hours. Lab work as above. Suspect pt has possible UTI versus prostatitis. CT scan shows no acute surgical abnormality, no evidence of stone. He has no signs of prostatic abscess. WBC normal, normal LA, and he has no lab or vital evidence of sepsis. He does have an incidentally noted elevated lipase - he has some chronic abd pain which may be 2/2 this, but no signs of superinfection, necrosis, or hemorrhage. He feels improved with sx control in the ED here.  Given prostatic tenderness and lower abd/perineal pain, will tx as possible prostatitis/UTI with Cipro daily. I  have discussed with nursing and lab - will try to obtain UCx here but <20 cc in bladder on bedside scan, will try to obtain enough for culture. Otherwise, will d/c with outpt follow-up, good return precautions, and Urology referral via his PCP.  Final Clinical Impressions(s) / ED Diagnoses   Final diagnoses:  Acute prostatitis  Elevated lipase    New Prescriptions Discharge Medication List as of 11/19/2016 12:02 AM    START taking these medications   Details  ciprofloxacin (CIPRO) 500 MG tablet Take 1 tablet (500 mg total) by mouth daily. Take after dialysis on dialysis days, Starting Thu 11/18/2016, Until Sun 11/28/2016, Print    oxyCODONE (ROXICODONE) 5 MG immediate release tablet Take 1 tablet (5 mg total) by mouth every 6 (six) hours as needed for severe pain., Starting Thu 11/18/2016, Print         Duffy Bruce, MD 11/19/16 1150

## 2016-11-18 NOTE — ED Triage Notes (Signed)
Pt is a dialysis pt, states that he has not voided since Monday, normally goes twice a day and feels like he has to go.

## 2016-11-18 NOTE — ED Provider Notes (Signed)
[facility] Provider Note   CSN: [id number] Arrival date & time: 11/17/16  2252     History   Chief Complaint Chief Complaint  Patient presents with  . Urinary Retention    HPI Howard Bell is a 51 y.o. malewith insulin-dependent diabetes and end-stage renal disease on hemodialysis. The patient dialyzes on Monday, Wednesday, and Friday. Last dialysis was yesterday. Patient states that he does make urine. He urinates about every other day. He was supposed to make urine yesterday, however he was unable to urinate. He has associated bladder spasms and urgency to urinate but states that every time he tries to urinate, nothing will come out. He denies fevers, chills. He denies back pain. Bladder scan performed in the emergency department showed only 35 ML in the bladder. We were unable to obtain any urine on an in and out catheter.  HPI  Past Medical History:  Diagnosis Date  . Congestive heart failure (CHF) ([place])   . 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 ESRD (end stage renal disease) on dialysis ([place])   . Hypertension     Patient Active Problem List   Diagnosis Date Noted  . ESRD (end stage renal disease) ([place]) 08/04/2016  . Acute respiratory failure with hypoxemia ([place]) 08/04/2016  . Diabetes mellitus with complication ([place]) 98/33/8250  . Acute CHF (congestive heart failure) ([place]) 08/04/2016  . Hypertensive emergency 08/04/2016  . Elevated troponin     Past Surgical History:  Procedure Laterality Date  . HERNIA REPAIR         Home Medications    Prior to Admission medications   Medication Sig Start Date End Date Taking? Authorizing Provider  aspirin EC 81 MG tablet Take 1 tablet (81 mg total) by mouth daily. 08/06/16   Thurnell Lose, MD  brimonidine (ALPHAGAN) 0.2 % ophthalmic solution Place 1 drop into both eyes 2 (two) times daily.    [provider]  cinacalcet (SENSIPAR) 90 MG tablet Take 90 mg by mouth  every evening.     [provider]  ferric citrate (AURYXIA) 1 GM 210 MG(Fe) tablet Take 630 mg by mouth 3 (three) times daily with meals.    [provider]  insulin lispro (HUMALOG KWIKPEN) 100 UNIT/ML KiwkPen Inject 5-10 Units into the skin See admin instructions. Per sliding scale    [provider]  metoprolol succinate (TOPROL-XL) 100 MG 24 hr tablet Take 1 tablet (100 mg total) by mouth daily. Take with or immediately following a meal. 08/07/16   Thurnell Lose, MD    Family History Family History  Problem Relation Age of Onset  . Diabetes Mother   . Hypertension Father     Social History Social History  Substance Use Topics  . Smoking status: Never Smoker  . Smokeless tobacco: Never Used  . Alcohol use No     Allergies   Patient has no known allergies.   Review of Systems Review of Systems  Ten systems reviewed and are negative for acute change, except as noted in the HPI.   Physical Exam Updated Vital Signs BP 117/69   Pulse (!) 105   Temp 98.5 F (36.9 C)   Resp 18   SpO2 95%   Physical Exam   ED Treatments / Results  Labs (all labs ordered are listed, but only abnormal results are displayed) Labs Reviewed  URINALYSIS, ROUTINE W REFLEX MICROSCOPIC    EKG  EKG Interpretation None  Radiology No results found.  Procedures Procedures (including critical care time)  Medications Ordered in ED Medications - No data to display   Initial Impression / Assessment and Plan / ED Course  I have reviewed the triage vital signs and the nursing notes.  Pertinent labs & imaging results that were available during my care of the patient were reviewed by me and considered in my medical decision making (see chart for details).     Physical Exam  Nursing note and vitals reviewed. Constitutional: He appears well-developed and well-nourished. No distress.  HENT:  Head: Normocephalic and atraumatic.  Eyes: Conjunctivae  normal are normal. No scleral icterus.  Neck: Normal range of motion. Neck supple.  Cardiovascular: Normal rate, regular rhythm and normal heart sounds.  Dialysis graft in the left upper extremity with palpable thrill. Pulmonary/Chest: Effort normal and breath sounds normal. No respiratory distress.  Abdominal: Soft. There is no tenderness.  Musculoskeletal: He exhibits no edema.  Neurological: He is alert.  Skin: Skin is warm and dry. He is not diaphoretic.  Psychiatric: His behavior is normal.     Final Clinical Impressions(s) / ED Diagnoses   Final diagnoses:  Urinary dysfunction   Patient with out urine and his bladder. I feel that his symptoms are most likely related to the fact that he is actually not producing any urine at this time. I have instructed the patient that should he produce urine he should collected for urinalysis just to be sure he doesn't have a urinary tract infection. The patient will be discharged up with his primary care physician. I instructed the patient on return precautions to include back pain, fever, chills. He appears safe for discharge at this time  New Prescriptions New Prescriptions   No medications on file     Margarita Mail, PA-C 11/18/16 [place], [place], MD 11/18/16 1426

## 2016-11-19 DIAGNOSIS — Z992 Dependence on renal dialysis: Secondary | ICD-10-CM | POA: Diagnosis not present

## 2016-11-19 DIAGNOSIS — N186 End stage renal disease: Secondary | ICD-10-CM | POA: Diagnosis not present

## 2016-11-19 DIAGNOSIS — N2581 Secondary hyperparathyroidism of renal origin: Secondary | ICD-10-CM | POA: Diagnosis not present

## 2016-11-19 DIAGNOSIS — E1129 Type 2 diabetes mellitus with other diabetic kidney complication: Secondary | ICD-10-CM | POA: Diagnosis not present

## 2016-11-19 LAB — URINALYSIS, ROUTINE W REFLEX MICROSCOPIC

## 2016-11-19 LAB — URINALYSIS, MICROSCOPIC (REFLEX)

## 2016-11-19 MED ORDER — LIDOCAINE HCL 2 % EX GEL
CUTANEOUS | Status: AC
  Filled 2016-11-19: qty 11

## 2016-11-19 NOTE — ED Notes (Signed)
In and out cath done scant amount blood drainage/urine obtained

## 2016-11-22 DIAGNOSIS — N186 End stage renal disease: Secondary | ICD-10-CM | POA: Diagnosis not present

## 2016-11-22 DIAGNOSIS — E1129 Type 2 diabetes mellitus with other diabetic kidney complication: Secondary | ICD-10-CM | POA: Diagnosis not present

## 2016-11-22 DIAGNOSIS — Z23 Encounter for immunization: Secondary | ICD-10-CM | POA: Diagnosis not present

## 2016-11-22 DIAGNOSIS — N2581 Secondary hyperparathyroidism of renal origin: Secondary | ICD-10-CM | POA: Diagnosis not present

## 2016-11-22 LAB — URINE CULTURE: Culture: 30000 — AB

## 2016-11-23 ENCOUNTER — Telehealth: Payer: Self-pay | Admitting: Emergency Medicine

## 2016-11-23 NOTE — Telephone Encounter (Signed)
Post ED Visit - Positive Culture Follow-up  Culture report reviewed by antimicrobial stewardship pharmacist:  []  Elenor Quinones, Pharm.D. []  Heide Guile, Pharm.D., BCPS AQ-ID []  Parks Neptune, Pharm.D., BCPS []  Alycia Rossetti, Pharm.D., BCPS []  [place], Pharm.D., BCPS, AAHIVP []  Legrand Como, Pharm.D., BCPS, AAHIVP []  Salome Arnt, PharmD, BCPS []  Dimitri Ped, PharmD, BCPS [x]  Vincenza Hews, PharmD, BCPS  Positive urine culture Treated with ciprofloxacin, organism sensitive to the same and no further patient follow-up is required at this time.  Hazle Nordmann 11/23/2016, 1:12 PM

## 2016-11-24 DIAGNOSIS — N2581 Secondary hyperparathyroidism of renal origin: Secondary | ICD-10-CM | POA: Diagnosis not present

## 2016-11-24 DIAGNOSIS — N186 End stage renal disease: Secondary | ICD-10-CM | POA: Diagnosis not present

## 2016-11-24 DIAGNOSIS — Z23 Encounter for immunization: Secondary | ICD-10-CM | POA: Diagnosis not present

## 2016-11-24 DIAGNOSIS — E1129 Type 2 diabetes mellitus with other diabetic kidney complication: Secondary | ICD-10-CM | POA: Diagnosis not present

## 2016-11-26 ENCOUNTER — Observation Stay (HOSPITAL_COMMUNITY)
Admission: EM | Admit: 2016-11-26 | Discharge: 2016-11-27 | Disposition: A | Discharge status: Home / Self Care | Payer: Medicare Other | Attending: Internal Medicine | Admitting: Internal Medicine

## 2016-11-26 ENCOUNTER — Emergency Department (HOSPITAL_COMMUNITY): Payer: Medicare Other

## 2016-11-26 ENCOUNTER — Encounter (HOSPITAL_COMMUNITY): Payer: Self-pay | Admitting: *Deleted

## 2016-11-26 DIAGNOSIS — I509 Heart failure, unspecified: Secondary | ICD-10-CM | POA: Insufficient documentation

## 2016-11-26 DIAGNOSIS — N186 End stage renal disease: Secondary | ICD-10-CM | POA: Diagnosis not present

## 2016-11-26 DIAGNOSIS — E119 Type 2 diabetes mellitus without complications: Secondary | ICD-10-CM | POA: Insufficient documentation

## 2016-11-26 DIAGNOSIS — Z992 Dependence on renal dialysis: Secondary | ICD-10-CM | POA: Insufficient documentation

## 2016-11-26 DIAGNOSIS — R079 Chest pain, unspecified: Secondary | ICD-10-CM | POA: Diagnosis not present

## 2016-11-26 DIAGNOSIS — E1129 Type 2 diabetes mellitus with other diabetic kidney complication: Secondary | ICD-10-CM | POA: Diagnosis not present

## 2016-11-26 DIAGNOSIS — E11621 Type 2 diabetes mellitus with foot ulcer: Secondary | ICD-10-CM | POA: Diagnosis present

## 2016-11-26 DIAGNOSIS — I132 Hypertensive heart and chronic kidney disease with heart failure and with stage 5 chronic kidney disease, or end stage renal disease: Secondary | ICD-10-CM | POA: Diagnosis not present

## 2016-11-26 DIAGNOSIS — Z7982 Long term (current) use of aspirin: Secondary | ICD-10-CM | POA: Insufficient documentation

## 2016-11-26 DIAGNOSIS — Z79899 Other long term (current) drug therapy: Secondary | ICD-10-CM | POA: Insufficient documentation

## 2016-11-26 DIAGNOSIS — R072 Precordial pain: Secondary | ICD-10-CM | POA: Diagnosis not present

## 2016-11-26 DIAGNOSIS — R0602 Shortness of breath: Secondary | ICD-10-CM | POA: Diagnosis not present

## 2016-11-26 DIAGNOSIS — Z794 Long term (current) use of insulin: Secondary | ICD-10-CM | POA: Diagnosis not present

## 2016-11-26 DIAGNOSIS — N2581 Secondary hyperparathyroidism of renal origin: Secondary | ICD-10-CM | POA: Diagnosis not present

## 2016-11-26 DIAGNOSIS — D649 Anemia, unspecified: Secondary | ICD-10-CM | POA: Diagnosis not present

## 2016-11-26 DIAGNOSIS — M79602 Pain in left arm: Secondary | ICD-10-CM | POA: Diagnosis not present

## 2016-11-26 DIAGNOSIS — I10 Essential (primary) hypertension: Secondary | ICD-10-CM

## 2016-11-26 DIAGNOSIS — L97509 Non-pressure chronic ulcer of other part of unspecified foot with unspecified severity: Secondary | ICD-10-CM

## 2016-11-26 DIAGNOSIS — D696 Thrombocytopenia, unspecified: Secondary | ICD-10-CM | POA: Diagnosis not present

## 2016-11-26 DIAGNOSIS — D631 Anemia in chronic kidney disease: Secondary | ICD-10-CM | POA: Diagnosis present

## 2016-11-26 DIAGNOSIS — Z23 Encounter for immunization: Secondary | ICD-10-CM | POA: Diagnosis not present

## 2016-11-26 LAB — COMPREHENSIVE METABOLIC PANEL
ALT: 15 U/L — ABNORMAL LOW (ref 17–63)
AST: 23 U/L (ref 15–41)
Albumin: 3.6 g/dL (ref 3.5–5.0)
Alkaline Phosphatase: 138 U/L — ABNORMAL HIGH (ref 38–126)
Anion gap: 13 (ref 5–15)
BUN: 33 mg/dL — ABNORMAL HIGH (ref 6–20)
CO2: 30 mmol/L (ref 22–32)
Calcium: 7.7 mg/dL — ABNORMAL LOW (ref 8.9–10.3)
Chloride: 92 mmol/L — ABNORMAL LOW (ref 101–111)
Creatinine, Ser: 9.88 mg/dL — ABNORMAL HIGH (ref 0.61–1.24)
GFR calc Af Amer: 6 mL/min — ABNORMAL LOW (ref >60)
GFR calc non Af Amer: 5 mL/min — ABNORMAL LOW (ref >60)
Glucose, Bld: 259 mg/dL — ABNORMAL HIGH (ref 65–99)
Potassium: 3.9 mmol/L (ref 3.5–5.1)
Sodium: 135 mmol/L (ref 135–145)
Total Bilirubin: 0.9 mg/dL (ref 0.3–1.2)
Total Protein: 8.3 g/dL — ABNORMAL HIGH (ref 6.5–8.1)

## 2016-11-26 LAB — CBC WITH DIFFERENTIAL/PLATELET
Basophils Absolute: 0.1 10*3/uL (ref 0.0–0.1)
Basophils Relative: 1 %
Eosinophils Absolute: 0.1 10*3/uL (ref 0.0–0.7)
Eosinophils Relative: 3 %
HCT: 38.7 % — ABNORMAL LOW (ref 39.0–52.0)
Hemoglobin: 12.7 g/dL — ABNORMAL LOW (ref 13.0–17.0)
Lymphocytes Relative: 30 %
Lymphs Abs: 1.6 10*3/uL (ref 0.7–4.0)
MCH: 30.7 pg (ref 26.0–34.0)
MCHC: 32.8 g/dL (ref 30.0–36.0)
MCV: 93.5 fL (ref 78.0–100.0)
Monocytes Absolute: 0.5 10*3/uL (ref 0.1–1.0)
Monocytes Relative: 9 %
Neutro Abs: 3.1 10*3/uL (ref 1.7–7.7)
Neutrophils Relative %: 57 %
Platelets: 136 10*3/uL — ABNORMAL LOW (ref 150–400)
RBC: 4.14 MIL/uL — ABNORMAL LOW (ref 4.22–5.81)
RDW: 14.2 % (ref 11.5–15.5)
WBC: 5.3 10*3/uL (ref 4.0–10.5)

## 2016-11-26 LAB — CBC
HCT: 39.4 % (ref 39.0–52.0)
Hemoglobin: 13.2 g/dL (ref 13.0–17.0)
MCH: 31.3 pg (ref 26.0–34.0)
MCHC: 33.5 g/dL (ref 30.0–36.0)
MCV: 93.4 fL (ref 78.0–100.0)
Platelets: 140 10*3/uL — ABNORMAL LOW (ref 150–400)
RBC: 4.22 MIL/uL (ref 4.22–5.81)
RDW: 14.1 % (ref 11.5–15.5)
WBC: 5.3 10*3/uL (ref 4.0–10.5)

## 2016-11-26 LAB — I-STAT TROPONIN, ED: Troponin i, poc: 0.04 ng/mL (ref 0.00–0.08)

## 2016-11-26 MED ORDER — MORPHINE SULFATE (PF) 2 MG/ML IV SOLN: 1.0000 mg | INTRAVENOUS | Status: DC | PRN

## 2016-11-26 MED ORDER — ACETAMINOPHEN 325 MG PO TABS: 650.0000 mg | ORAL_TABLET | ORAL | Status: DC | PRN

## 2016-11-26 MED ORDER — FENTANYL CITRATE (PF) 100 MCG/2ML IJ SOLN
50.0000 ug | Freq: Once | INTRAMUSCULAR | Status: AC
  Administered 2016-11-26: 50 ug via INTRAVENOUS
  Filled 2016-11-26: qty 2

## 2016-11-26 MED ORDER — TRAVOPROST (BAK FREE) 0.004 % OP SOLN
1.0000 [drp] | Freq: Every day | OPHTHALMIC | Status: DC
Start: 2016-11-26 — End: 2016-11-27
  Administered 2016-11-26: 1 [drp] via OPHTHALMIC
  Filled 2016-11-26: qty 2.5

## 2016-11-26 MED ORDER — TIMOLOL HEMIHYDRATE 0.5 % OP SOLN: 1.0000 [drp] | Freq: Two times a day (BID) | OPHTHALMIC | Status: DC

## 2016-11-26 MED ORDER — ASPIRIN EC 325 MG PO TBEC
325.0000 mg | DELAYED_RELEASE_TABLET | Freq: Every day | ORAL | Status: DC
  Filled 2016-11-26: qty 1

## 2016-11-26 MED ORDER — CINACALCET HCL 30 MG PO TABS
90.0000 mg | ORAL_TABLET | Freq: Every evening | ORAL | Status: DC
  Administered 2016-11-26: 90 mg via ORAL
  Filled 2016-11-26 (×2): qty 3

## 2016-11-26 MED ORDER — MORPHINE SULFATE (PF) 4 MG/ML IV SOLN
4.0000 mg | Freq: Once | INTRAVENOUS | Status: AC
  Administered 2016-11-26: 4 mg via INTRAVENOUS
  Filled 2016-11-26: qty 1

## 2016-11-26 MED ORDER — BRIMONIDINE TARTRATE 0.2 % OP SOLN
1.0000 [drp] | Freq: Two times a day (BID) | OPHTHALMIC | Status: DC
  Administered 2016-11-26 – 2016-11-27 (×2): 1 [drp] via OPHTHALMIC
  Filled 2016-11-26: qty 5

## 2016-11-26 MED ORDER — AMLODIPINE BESYLATE 10 MG PO TABS
10.0000 mg | ORAL_TABLET | Freq: Every day | ORAL | Status: DC
  Filled 2016-11-26: qty 1

## 2016-11-26 MED ORDER — ONDANSETRON HCL 4 MG/2ML IJ SOLN: 4.0000 mg | Freq: Four times a day (QID) | INTRAMUSCULAR | Status: DC | PRN

## 2016-11-26 MED ORDER — GI COCKTAIL ~~LOC~~
30.0000 mL | Freq: Once | ORAL | Status: AC
  Administered 2016-11-26: 30 mL via ORAL
  Filled 2016-11-26: qty 30

## 2016-11-26 MED ORDER — METOPROLOL SUCCINATE ER 100 MG PO TB24
100.0000 mg | ORAL_TABLET | Freq: Every day | ORAL | Status: DC
  Filled 2016-11-26: qty 1

## 2016-11-26 MED ORDER — HEPARIN SODIUM (PORCINE) 5000 UNIT/ML IJ SOLN
5000.0000 [IU] | Freq: Three times a day (TID) | INTRAMUSCULAR | Status: DC
  Administered 2016-11-26 – 2016-11-27 (×2): 5000 [IU] via SUBCUTANEOUS
  Filled 2016-11-26 (×2): qty 1

## 2016-11-26 MED ORDER — TIMOLOL MALEATE 0.5 % OP SOLN
1.0000 [drp] | Freq: Two times a day (BID) | OPHTHALMIC | Status: DC
  Administered 2016-11-26 – 2016-11-27 (×2): 1 [drp] via OPHTHALMIC
  Filled 2016-11-26: qty 5

## 2016-11-26 NOTE — ED Provider Notes (Signed)
Emergency Department Provider Note   I have reviewed the triage vital signs and the nursing notes.   HISTORY  Chief Complaint Chest Pain   HPI Howard Bell is a 51 y.o. male with PMH of CHF, DM, ESRD, and HTN presents to the ED for evaluation of CP that began suddenly during HD. Patient reports having pain in the left arm after fistula access. He states that he felt like they "hit a nerve." At approximately 2 PM the patient began having substernal, severe CP. No SOB or diaphoresis. No pleuritic quality. He continues to have severe pain. Also notes low BP during HD (SBP 90s). No fever or chills. No modifying factors. No radiation. Pain is severe.   Past Medical History:  Diagnosis Date  . Congestive heart failure (CHF) ([place])   . 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 ESRD (end stage renal disease) on dialysis ([place])   . Hypertension     Patient Active Problem List   Diagnosis Date Noted  . Chest pain 11/26/2016  . Essential hypertension 11/26/2016  . ESRD (end stage renal disease) ([place]) 08/04/2016  . Acute respiratory failure with hypoxemia ([place]) 08/04/2016  . Diabetes mellitus with complication ([place]) 36/64/4034  . Acute CHF (congestive heart failure) ([place]) 08/04/2016  . Hypertensive emergency 08/04/2016  . Elevated troponin     Past Surgical History:  Procedure Laterality Date  . HERNIA REPAIR        Allergies Patient has no known allergies.  Family History  Problem Relation Age of Onset  . Diabetes Mother   . Hypertension Father     Social History Social History  Substance Use Topics  . Smoking status: Never Smoker  . Smokeless tobacco: Never Used  . Alcohol use No    Review of Systems  Constitutional: No fever/chills Eyes: No visual changes. ENT: No sore throat. Cardiovascular: Positive chest pain. Respiratory: Denies shortness of breath. Gastrointestinal: No abdominal pain.  No nausea, no vomiting.  No  diarrhea.  No constipation. Genitourinary: Negative for dysuria. Musculoskeletal: Negative for back pain. Skin: Negative for rash. Neurological: Negative for headaches, focal weakness or numbness.  10-point ROS otherwise negative.  ____________________________________________   PHYSICAL EXAM:  VITAL SIGNS: Vitals:   11/26/16 2015 11/26/16 2121  BP: (!) 145/86 (!) 148/90  Pulse: 87 91  Resp: 16 (!) 94  Temp:  98.3 F (36.8 C)  SpO2: 95% 92%    Constitutional: Alert and oriented. Well appearing and in no acute distress. Eyes: Conjunctivae are normal.  Head: Atraumatic. Nose: No congestion/rhinnorhea. Mouth/Throat: Mucous membranes are moist.   Neck: No stridor.   Cardiovascular: Normal rate, regular rhythm. Good peripheral circulation. Grossly normal heart sounds. Left upper extremity fistula with HD access in place.  Respiratory: Normal respiratory effort.  No retractions. Lungs CTAB. Gastrointestinal: Soft and nontender. No distention.  Musculoskeletal: No lower extremity tenderness nor edema. No gross deformities of extremities. Neurologic:  Normal speech and language. No gross focal neurologic deficits are appreciated.  Skin:  Skin is warm, dry and intact. No rash noted.  ____________________________________________   LABS (all labs ordered are listed, but only abnormal results are displayed)  Labs Reviewed  COMPREHENSIVE METABOLIC PANEL - Abnormal; Notable for the following:       Result Value   Chloride 92 (*)    Glucose, Bld 259 (*)    BUN 33 (*)    Creatinine, Ser 9.88 (*)    Calcium 7.7 (*)    Total  Protein 8.3 (*)    ALT 15 (*)    Alkaline Phosphatase 138 (*)    GFR calc non Af Amer 5 (*)    GFR calc Af Amer 6 (*)    All other components within normal limits  CBC WITH DIFFERENTIAL/PLATELET - Abnormal; Notable for the following:    RBC 4.14 (*)    Hemoglobin 12.7 (*)    HCT 38.7 (*)    Platelets 136 (*)    All other components within normal limits    MRSA PCR SCREENING  TROPONIN I  TROPONIN I  TROPONIN I  CBC  CREATININE, SERUM  I-STAT TROPONIN, ED   ____________________________________________  EKG   EKG Interpretation  Date/Time:  Friday November 26 2016 15:47:26 EDT Ventricular Rate:  108 PR Interval:    QRS Duration: 119 QT Interval:  367 QTC Calculation: 492 R Axis:   -58 Text Interpretation:  Sinus tachycardia LVH with IVCD, LAD and secondary repol abnrm Borderline prolonged QT interval No STEMI.  Confirmed by Nanda Quinton [phone]) on 11/26/2016 3:56:45 PM Also confirmed by Nanda Quinton [phone]), editor Philomena Doheny [phone])  on 11/26/2016 4:05:37 PM       ____________________________________________  RADIOLOGY  Dg Chest 2 View  Result Date: 11/26/2016 CLINICAL DATA:  51 year old male with history of chest pain and shortness of breath today. EXAM: CHEST  2 VIEW COMPARISON:  Chest x-ray 08/04/2016. FINDINGS: Elevation of the right hemidiaphragm. Lung volumes are very low which limits assessment. Additionally, there is underpenetration of the image, which also limits the diagnostic sensitivity and specificity of the examination. However, there is no definite consolidative airspace disease. No pleural effusions. Pulmonary vasculature does not appear engorged. Heart size appears mildly enlarged. Upper mediastinal contours are distorted by low lung volumes and patient positioning. IMPRESSION: 1. Low lung volumes without definite radiographic evidence of acute cardiopulmonary disease. 2. Elevation of the right hemidiaphragm, similar to the prior study. Electronically Signed   By: Vinnie Langton M.D.   On: 11/26/2016 17:18    ____________________________________________   PROCEDURES  Procedure(s) performed:   Procedures  None ____________________________________________   INITIAL IMPRESSION / ASSESSMENT AND PLAN / ED COURSE  Pertinent labs & imaging results that were available during my care of the patient were  reviewed by me and considered in my medical decision making (see chart for details).  Patient presents to the emergency department for evaluation of chest pain. He received sublingual nitroglycerin in route with no improvement in pain. Given aspirin prior to arrival. Patient has several ACS risk factors. Initiate w/u for ACS.  Discussed patient's case with Hospitalist, Dr. Hal Hope to request admission. Patient and family (if present) updated with plan. Care transferred to Hospitalist service.  I reviewed all nursing notes, vitals, pertinent old records, EKGs, labs, imaging (as available).  ____________________________________________  FINAL CLINICAL IMPRESSION(S) / ED DIAGNOSES  Final diagnoses:  Precordial chest pain     MEDICATIONS GIVEN DURING THIS VISIT:  Medications  amLODipine (NORVASC) tablet 10 mg (not administered)  Travoprost (BAK Free) (TRAVATAN) 0.004 % ophthalmic solution SOLN 1 drop (1 drop Both Eyes Given 11/26/16 2220)  brimonidine (ALPHAGAN) 0.2 % ophthalmic solution 1 drop (1 drop Both Eyes Given 11/26/16 2227)  cinacalcet (SENSIPAR) tablet 90 mg (90 mg Oral Given 11/26/16 2222)  metoprolol succinate (TOPROL-XL) 24 hr tablet 100 mg (not administered)  acetaminophen (TYLENOL) tablet 650 mg (not administered)  ondansetron (ZOFRAN) injection 4 mg (not administered)  heparin injection 5,000 Units (5,000 Units Subcutaneous Given 11/26/16 2222)  aspirin EC tablet 325 mg (not administered)  morphine 2 MG/ML injection 1 mg (not administered)  timolol (TIMOPTIC) 0.5 % ophthalmic solution 1 drop (1 drop Both Eyes Given 11/26/16 2224)  fentaNYL (SUBLIMAZE) injection 50 mcg (50 mcg Intravenous Given 11/26/16 1702)  morphine 4 MG/ML injection 4 mg (4 mg Intravenous Given 11/26/16 1927)  gi cocktail (Maalox,Lidocaine,Donnatal) (30 mLs Oral Given 11/26/16 1927)     NEW OUTPATIENT MEDICATIONS STARTED DURING THIS VISIT:  None   Note:  This document was prepared using Dragon voice  recognition software and may include unintentional dictation errors.  Nanda Quinton, MD Emergency Medicine    Long, [facility], MD 11/27/16 0000

## 2016-11-26 NOTE — ED Notes (Addendum)
Attempted to call report and the secretary advised the charge nurse has not assigned anybody that room. Advised she will have that RN call when it is assigned.

## 2016-11-26 NOTE — ED Triage Notes (Signed)
The pt arrived by  [facility] from the dialysis center he was  Being connected  To his shunt when he experienced a sharop pain from the lt arm up into the chest   He was given 2 sl nitro that did not make any difference in his pain  Ems gave him 324 mg  Of aspirin  His fistula is still accessed and he missed 2.5 hours of dialysis alert no distress cbg 204

## 2016-11-26 NOTE — ED Notes (Signed)
Pt reports he was having dialysis treatment when his chest pain began. Pt states the pain progressed to his chest from his fistula area.

## 2016-11-26 NOTE — ED Notes (Addendum)
Attempted to call report for a 2nd time and the charge RN still had not assigned a nurse the room. Secretary Butch Penny put the charge nurse [place] on the phone who said they were busy and she would assigning the room in 5 minutes. Advised to called back in 5 minutes.

## 2016-11-26 NOTE — H&P (Signed)
History and Physical    Kier Smead RSW:[id number] DOB: 1965-07-02 DOA: 11/26/2016  PCP: System, Pcp Not In  Patient coming from: Home.  Chief Complaint: Chest pain.  HPI: Howard Bell is a 51 y.o. male with known history of ESRD on hemodialysis, hypertension, diabetes mellitus, anemia presents to ER with complaint of chest pain. Patient was having dialysis when suddenly. He started developing chest pain around 2 PM today. Pain is retrosternal squeezing in nature and nonradiating. Denies any associated shortness of breath or productive cough.   ED Course: In the ER last 3 and normal troponin EKG was showing sinus tachycardia and chest x-ray was unremarkable. Patient's chest pain improved with fentanyl and nitroglycerin but still persist. Patient will be admitted for further management of chest pain.  Review of Systems: As per HPI, rest all negative.   Past Medical History:  Diagnosis Date  . Congestive heart failure (CHF) ([place])   . 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 ESRD (end stage renal disease) on dialysis ([place])   . Hypertension     Past Surgical History:  Procedure Laterality Date  . HERNIA REPAIR       reports that he has never smoked. He has never used smokeless tobacco. He reports that he does not drink alcohol or use drugs.  No Known Allergies  Family History  Problem Relation Age of Onset  . Diabetes Mother   . Hypertension Father     Prior to Admission medications   Medication Sig Start Date End Date Taking? Authorizing Provider  amLODipine (NORVASC) 10 MG tablet Take 10 mg by mouth daily.    Yes [provider]  brimonidine (ALPHAGAN) 0.2 % ophthalmic solution Place 1 drop into both eyes 2 (two) times daily.   Yes [provider]  cinacalcet (SENSIPAR) 90 MG tablet Take 90 mg by mouth every evening.    Yes [provider]  ciprofloxacin (CIPRO) 500 MG tablet Take 1 tablet (500 mg total) by  mouth daily. Take after dialysis on dialysis days 11/18/16 11/28/16 Yes Duffy Bruce, MD  insulin lispro (HUMALOG KWIKPEN) 100 UNIT/ML KiwkPen Inject 5-10 Units into the skin See admin instructions. Per sliding scale   Yes [provider]  metoprolol succinate (TOPROL-XL) 100 MG 24 hr tablet Take 1 tablet (100 mg total) by mouth daily. Take with or immediately following a meal. 08/07/16  Yes Thurnell Lose, MD  oxyCODONE (ROXICODONE) 5 MG immediate release tablet Take 1 tablet (5 mg total) by mouth every 6 (six) hours as needed for severe pain. 11/18/16  Yes Duffy Bruce, MD  timolol (BETIMOL) 0.5 % ophthalmic solution Place 1 drop into both eyes 2 (two) times daily.   Yes [provider]  travoprost, benzalkonium, (TRAVATAN) 0.004 % ophthalmic solution Place 1 drop into both eyes at bedtime.   Yes [provider]  aspirin EC 81 MG tablet Take 1 tablet (81 mg total) by mouth daily. Patient not taking: Reported on 11/18/2016 08/06/16   Thurnell Lose, MD    Physical Exam: Vitals:   11/26/16 1915 11/26/16 1945 11/26/16 2015 11/26/16 2121  BP: 123/77 119/65 (!) 145/86 (!) 148/90  Pulse: 92 88 87 91  Resp: 14 11 16  (!) 94  Temp:    98.3 F (36.8 C)  TempSrc:    Oral  SpO2: 91% 91% 95% 92%  Weight:    96.9 kg (213 lb 11.2 oz)  Height:    6' (1.829 m)  Constitutional: Moderately built and nourished. Vitals:   11/26/16 1915 11/26/16 1945 11/26/16 2015 11/26/16 2121  BP: 123/77 119/65 (!) 145/86 (!) 148/90  Pulse: 92 88 87 91  Resp: 14 11 16  (!) 94  Temp:    98.3 F (36.8 C)  TempSrc:    Oral  SpO2: 91% 91% 95% 92%  Weight:    96.9 kg (213 lb 11.2 oz)  Height:    6' (1.829 m)   Eyes: Anicteric no pallor. ENMT: No discharge from the ears eyes nose and mouth. Neck: No mass felt. No JVD appreciated. Respiratory: No rhonchi or crepitations. Cardiovascular: S1-S2, no murmurs appreciated. Abdomen: Soft nontender bowel sounds present. Musculoskeletal:  No edema. No joint effusion. Skin: No rash. Skin appears warm. Neurologic: Alert awake oriented to time place and person. Moves all extremities. Psychiatric: Appears normal. Normal affect.   Labs on Admission: I have personally reviewed following labs and imaging studies  CBC:  Recent Labs Lab 11/26/16 1625  WBC 5.3  NEUTROABS 3.1  HGB 12.7*  HCT 38.7*  MCV 93.5  PLT 295*   Basic Metabolic Panel:  Recent Labs Lab 11/26/16 1625  NA 135  K 3.9  CL 92*  CO2 30  GLUCOSE 259*  BUN 33*  CREATININE 9.88*  CALCIUM 7.7*   GFR: Estimated Creatinine Clearance: 10.9 mL/min (A) (by C-G formula based on SCr of 9.88 mg/dL (H)). Liver Function Tests:  Recent Labs Lab 11/26/16 1625  AST 23  ALT 15*  ALKPHOS 138*  BILITOT 0.9  PROT 8.3*  ALBUMIN 3.6   No results for input(s): LIPASE, AMYLASE in the last 168 hours. No results for input(s): AMMONIA in the last 168 hours. Coagulation Profile: No results for input(s): INR, PROTIME in the last 168 hours. Cardiac Enzymes: No results for input(s): CKTOTAL, CKMB, CKMBINDEX, TROPONINI in the last 168 hours. BNP (last 3 results) No results for input(s): PROBNP in the last 8760 hours. HbA1C: No results for input(s): HGBA1C in the last 72 hours. CBG: No results for input(s): GLUCAP in the last 168 hours. Lipid Profile: No results for input(s): CHOL, HDL, LDLCALC, TRIG, CHOLHDL, LDLDIRECT in the last 72 hours. Thyroid Function Tests: No results for input(s): TSH, T4TOTAL, FREET4, T3FREE, THYROIDAB in the last 72 hours. Anemia Panel: No results for input(s): VITAMINB12, FOLATE, FERRITIN, TIBC, IRON, RETICCTPCT in the last 72 hours. Urine analysis:    Component Value Date/Time   COLORURINE RED (A) 11/19/2016 0020   APPEARANCEUR TURBID (A) 11/19/2016 0020   LABSPEC  11/19/2016 0020    TEST NOT REPORTED DUE TO COLOR INTERFERENCE OF URINE PIGMENT   PHURINE  11/19/2016 0020    TEST NOT REPORTED DUE TO COLOR INTERFERENCE OF URINE  PIGMENT   GLUCOSEU (A) 11/19/2016 0020    TEST NOT REPORTED DUE TO COLOR INTERFERENCE OF URINE PIGMENT   HGBUR (A) 11/19/2016 0020    TEST NOT REPORTED DUE TO COLOR INTERFERENCE OF URINE PIGMENT   BILIRUBINUR (A) 11/19/2016 0020    TEST NOT REPORTED DUE TO COLOR INTERFERENCE OF URINE PIGMENT   KETONESUR (A) 11/19/2016 0020    TEST NOT REPORTED DUE TO COLOR INTERFERENCE OF URINE PIGMENT   PROTEINUR (A) 11/19/2016 0020    TEST NOT REPORTED DUE TO COLOR INTERFERENCE OF URINE PIGMENT   NITRITE (A) 11/19/2016 0020    TEST NOT REPORTED DUE TO COLOR INTERFERENCE OF URINE PIGMENT   LEUKOCYTESUR (A) 11/19/2016 0020    TEST NOT REPORTED DUE TO COLOR INTERFERENCE OF URINE PIGMENT  Sepsis Labs: @LABRCNTIP (procalcitonin:4,lacticidven:4) ) Recent Results (from the past 240 hour(s))  Urine culture     Status: Abnormal   Collection Time: 11/19/16 12:20 AM  Result Value Ref Range Status   Specimen Description URINE, CLEAN CATCH  Final   Special Requests NONE  Final   Culture (A)  Final    30,000 COLONIES/mL ESCHERICHIA COLI Confirmed Extended Spectrum Beta-Lactamase Producer (ESBL) Performed at [facility], [address] [place], [place] [address]    Report Status 11/22/2016 FINAL  Final   Organism ID, Bacteria ESCHERICHIA COLI (A)  Final      Susceptibility   Escherichia coli - MIC*    AMPICILLIN >=32 RESISTANT Resistant     CEFAZOLIN >=64 RESISTANT Resistant     CEFTRIAXONE >=64 RESISTANT Resistant     CIPROFLOXACIN <=0.25 SENSITIVE Sensitive     GENTAMICIN <=1 SENSITIVE Sensitive     IMIPENEM <=0.25 SENSITIVE Sensitive     NITROFURANTOIN 64 INTERMEDIATE Intermediate     TRIMETH/SULFA >=320 RESISTANT Resistant     AMPICILLIN/SULBACTAM 8 SENSITIVE Sensitive     PIP/TAZO <=4 SENSITIVE Sensitive     Extended ESBL POSITIVE Resistant     * 30,000 COLONIES/mL ESCHERICHIA COLI     Radiological Exams on Admission: Dg Chest 2 View  Result Date: 11/26/2016 CLINICAL DATA:   51 year old male with history of chest pain and shortness of breath today. EXAM: CHEST  2 VIEW COMPARISON:  Chest x-ray 08/04/2016. FINDINGS: Elevation of the right hemidiaphragm. Lung volumes are very low which limits assessment. Additionally, there is underpenetration of the image, which also limits the diagnostic sensitivity and specificity of the examination. However, there is no definite consolidative airspace disease. No pleural effusions. Pulmonary vasculature does not appear engorged. Heart size appears mildly enlarged. Upper mediastinal contours are distorted by low lung volumes and patient positioning. IMPRESSION: 1. Low lung volumes without definite radiographic evidence of acute cardiopulmonary disease. 2. Elevation of the right hemidiaphragm, similar to the prior study. Electronically Signed   By: Vinnie Langton M.D.   On: 11/26/2016 17:18    EKG: Independently reviewed. Sinus tachycardia.  Assessment/Plan Principal Problem:   Chest pain Active Problems:   ESRD (end stage renal disease) ([place])   Diabetes mellitus with complication ([place])   Essential hypertension    1. Chest pain - given the risk factors including diabetes hypertension and renal failure will cycle cardiac markers to rule out ACS. Check 2-D echo d-dimer. Since patient still has chest pain will keep patient on nitroglycerin patch. Consult cardiologist in a.m. 2. Diabetes mellitus type 2 - I have placed patient on sliding scale coverage. 3. Hypertension continue metoprolol and amlodipine. 4. ESRD on hemodialysis on Monday Wednesday and Friday - consult nephrologist. 5. Chronic anemia and thrombocytopenia - follow CBC.  I have reviewed patient's old charts and labs.   DVT prophylaxis: Heparin. Code Status: Full code.  Family Communication: Discussed with patient.  Disposition Plan: Home.  Consults called: None.  Admission status: Observation.    Rise Patience MD [facility] Pager [phone].  If 7PM-7AM, please contact night-coverage [email or web address] Password [facility]  11/26/2016, 9:28 PM

## 2016-11-26 NOTE — ED Notes (Signed)
IV x2 Unsuccessful due to pt twitching and moving.

## 2016-11-27 DIAGNOSIS — N186 End stage renal disease: Secondary | ICD-10-CM | POA: Diagnosis not present

## 2016-11-27 DIAGNOSIS — R072 Precordial pain: Secondary | ICD-10-CM

## 2016-11-27 DIAGNOSIS — R079 Chest pain, unspecified: Secondary | ICD-10-CM

## 2016-11-27 DIAGNOSIS — E118 Type 2 diabetes mellitus with unspecified complications: Secondary | ICD-10-CM | POA: Diagnosis not present

## 2016-11-27 DIAGNOSIS — I10 Essential (primary) hypertension: Secondary | ICD-10-CM

## 2016-11-27 LAB — CREATININE, SERUM
Creatinine, Ser: 10.82 mg/dL — ABNORMAL HIGH (ref 0.61–1.24)
GFR calc Af Amer: 6 mL/min — ABNORMAL LOW (ref >60)
GFR calc non Af Amer: 5 mL/min — ABNORMAL LOW (ref >60)

## 2016-11-27 LAB — TROPONIN I
Troponin I: 0.06 ng/mL (ref <0.03)
Troponin I: 0.06 ng/mL (ref <0.03)

## 2016-11-27 LAB — GLUCOSE, CAPILLARY
Glucose-Capillary: 176 mg/dL — ABNORMAL HIGH (ref 65–99)
Glucose-Capillary: 224 mg/dL — ABNORMAL HIGH (ref 65–99)

## 2016-11-27 LAB — D-DIMER, QUANTITATIVE: D-Dimer, Quant: 2.51 ug/mL-FEU — ABNORMAL HIGH (ref 0.00–0.50)

## 2016-11-27 LAB — MRSA PCR SCREENING: MRSA by PCR: POSITIVE — AB

## 2016-11-27 MED ORDER — FERRIC CITRATE 1 GM 210 MG(FE) PO TABS
630.0000 mg | ORAL_TABLET | Freq: Three times a day (TID) | ORAL | Status: DC
  Filled 2016-11-27: qty 3

## 2016-11-27 MED ORDER — MUPIROCIN 2 % EX OINT
1.0000 "application " | TOPICAL_OINTMENT | Freq: Two times a day (BID) | CUTANEOUS | Status: DC
  Administered 2016-11-27: 1 via NASAL
  Filled 2016-11-27: qty 22

## 2016-11-27 MED ORDER — INSULIN ASPART 100 UNIT/ML ~~LOC~~ SOLN
0.0000 [IU] | Freq: Three times a day (TID) | SUBCUTANEOUS | Status: DC
  Administered 2016-11-27: 2 [IU] via SUBCUTANEOUS
  Administered 2016-11-27: 3 [IU] via SUBCUTANEOUS

## 2016-11-27 MED ORDER — DICLOFENAC SODIUM 1 % TD GEL
4.0000 g | Freq: Four times a day (QID) | TRANSDERMAL | Status: DC
  Administered 2016-11-27: 14:00:00 4 g via TOPICAL
  Filled 2016-11-27: qty 100

## 2016-11-27 MED ORDER — KETOROLAC TROMETHAMINE 30 MG/ML IJ SOLN
30.0000 mg | Freq: Once | INTRAMUSCULAR | Status: AC
  Administered 2016-11-27: 30 mg via INTRAVENOUS
  Filled 2016-11-27: qty 1

## 2016-11-27 MED ORDER — CINACALCET HCL 30 MG PO TABS
60.0000 mg | ORAL_TABLET | Freq: Two times a day (BID) | ORAL | Status: DC
  Filled 2016-11-27: qty 2

## 2016-11-27 MED ORDER — ASPIRIN EC 81 MG PO TBEC
81.0000 mg | DELAYED_RELEASE_TABLET | Freq: Every day | ORAL | Status: DC
  Administered 2016-11-27: 81 mg via ORAL
  Filled 2016-11-27: qty 1

## 2016-11-27 MED ORDER — CHLORHEXIDINE GLUCONATE CLOTH 2 % EX PADS
6.0000 | MEDICATED_PAD | Freq: Every day | CUTANEOUS | Status: DC
  Administered 2016-11-27: 6 via TOPICAL

## 2016-11-27 MED ORDER — PREGABALIN 50 MG PO CAPS: 50.0000 mg | ORAL_CAPSULE | Freq: Every day | ORAL | Status: DC

## 2016-11-27 MED ORDER — NITROGLYCERIN 0.4 MG/HR TD PT24
0.4000 mg | MEDICATED_PATCH | Freq: Every day | TRANSDERMAL | Status: DC
  Filled 2016-11-27: qty 1

## 2016-11-27 MED ORDER — DICLOFENAC SODIUM 1 % TD GEL: 4.0000 g | Freq: Four times a day (QID) | TRANSDERMAL | Status: DC

## 2016-11-27 MED ORDER — INSULIN ASPART 100 UNIT/ML ~~LOC~~ SOLN
3.0000 [IU] | Freq: Once | SUBCUTANEOUS | Status: AC
  Administered 2016-11-27: 3 [IU] via SUBCUTANEOUS

## 2016-11-27 NOTE — Progress Notes (Signed)
Howard Bell is a 51 Y/O AA male on hemodialysis MWF at [facility]. PMH significant for DMT2, HTN, OSA on CPAP, CVA, MI, osteomyelitis L ring finger. Patient was having regularly scheduled hemodialysis 11/26/2016 when he developed 9/10 substernal chest pain without radiation, some nausea, no vomiting, no diaphoresis, mild dizziness. EMS was called and he was transported to [facility] for evaluation.EKG on arrival to ED showed ST with LVD, borderline prolonged Qt-interval. Troponin 0.06 X 2 flat trend. D Dimer 2.51. CXR-low lung volume without definite evidence of cardiopulmonary disease.  He is still C/O 7/10 chest pain-says nothing has made chest pain completely go away. He is localizing pain upper sternum/epigastric area.   He has been admitted as observation patient for chest pain. Cardiology has been consulted by primary.   He ran 3 hours 5 minutes of 4 hour 15 min treatment [id number]. K+3.9, no evidence of volume overload by exam. We will hold off HD today and will order his medications per home med list. Please notify us If his status is upgraded to in-patient and we will consult formally at that time. If he is still in hospital 11/29/2016 we will enter HD orders.   Dialysis Orders: [facility] MWF 4 hr 15 min 180 NRe 450/Auto 2.0 96.5 kg 2.0 K/ 2.25 Ca  -Heparin 9000 units IV TIW -Hectorol 2 mcg IV TIW  BMD meds:  -Auryxia 210 mg 3 tabs PO TID AC -Sensipar 60 mg PO BID

## 2016-11-27 NOTE — Progress Notes (Signed)
Pt d/c home in no acute distress.  Significant other at bedside.  All d/c paperwork reviewed in detail with pt.  Voltaren Gel given to pt along with all of his eye drops.  Pt understands why he was admitted and when he needs to follow up with the MD.  He will go to HD on Monday where they will give him the necessary RX for his HD meds.  All belongings sent home with pt.

## 2016-11-27 NOTE — Discharge Summary (Signed)
Physician Discharge Summary  Howard Bell KPT:[id number] DOB: [birth year] DOA: 11/26/2016  PCP: System, Pcp Not In  Admit date: 11/26/2016 Discharge date: 11/27/2016  Admitted From: home  Disposition:  home   Recommendations for Outpatient Follow-up:  1. F/u on reproducible chest pain Discharge Condition:  stable   CODE STATUS:  Full code   Consultations:  nephrology    Discharge Diagnoses:  Principal Problem:   Chest pain Active Problems:   ESRD (end stage renal disease) ([place])   Diabetes mellitus with complication ([place])   Essential hypertension     Brief Summary: Howard Bell is a 51 y.o. male with known history of ESRD on hemodialysis, hypertension, diabetes mellitus, anemia presents to ER with complaint of chest pain which occurred during dialysis present in the center of his chest. See HPI for further details. He does mention that there was trouble with his dialysis access yesterday and he did bleed a bit.  Hospital Course:  Chest pain - flat trend in Troponin which are minimally elevated - he complains that the pain has been quite constant since yesterday and if cardiac, Troponin would have been elevated- pain did not improve with Nitro s/l or Nitro paste and is reproducible on exam today. Will give a one time injection of Toradol and then Voltraen gel QID - he can take the tube home- no further work up  ESRD - he missed about 2 hrs of dialysis today- I have asked for Nephro to evaluate him for dialysis- the RN has been told by the Nepho extender (who has evaluated him) that he does not need dialysis today.  Recently seen in ER for UTI and Prostatitis on 8/30 - on Cipro  Mildly elevated D dimer - may be due to above infection and bleeding from fistula- not hypoxic- chest pain is not pleuritic in nature  Discharge Instructions  Discharge Instructions    Diet - low sodium heart healthy    Complete by:  As directed    Renal and diabetic diets   Increase  activity slowly    Complete by:  As directed      Allergies as of 11/27/2016   No Known Allergies     Medication List    TAKE these medications   amLODipine 10 MG tablet Commonly known as:  NORVASC Take 10 mg by mouth daily.   aspirin EC 81 MG tablet Take 1 tablet (81 mg total) by mouth daily.   brimonidine 0.2 % ophthalmic solution Commonly known as:  ALPHAGAN Place 1 drop into both eyes 2 (two) times daily.   cinacalcet 90 MG tablet Commonly known as:  SENSIPAR Take 90 mg by mouth every evening.   ciprofloxacin 500 MG tablet Commonly known as:  CIPRO Take 1 tablet (500 mg total) by mouth daily. Take after dialysis on dialysis days   diclofenac sodium 1 % Gel Commonly known as:  VOLTAREN Apply 4 g topically 4 (four) times daily.   HUMALOG KWIKPEN 100 UNIT/ML KiwkPen Generic drug:  insulin lispro Inject 5-10 Units into the skin See admin instructions. Per sliding scale   metoprolol succinate 100 MG 24 hr tablet Commonly known as:  TOPROL-XL Take 1 tablet (100 mg total) by mouth daily. Take with or immediately following a meal.   oxyCODONE 5 MG immediate release tablet Commonly known as:  ROXICODONE Take 1 tablet (5 mg total) by mouth every 6 (six) hours as needed for severe pain.   timolol 0.5 % ophthalmic solution Commonly known as:  BETIMOL  Place 1 drop into both eyes 2 (two) times daily.   travoprost (benzalkonium) 0.004 % ophthalmic solution Commonly known as:  TRAVATAN Place 1 drop into both eyes at bedtime.            Discharge Care Instructions        Start     Ordered   11/27/16 0000  diclofenac sodium (VOLTAREN) 1 % GEL  4 times daily     11/27/16 1347   11/27/16 0000  Increase activity slowly     11/27/16 1347   11/27/16 0000  Diet - low sodium heart healthy    Comments:  Renal and diabetic diets   11/27/16 1347      No Known Allergies   Procedures/Studies:    Ct Abdomen Pelvis Wo Contrast  Result Date: 11/18/2016 CLINICAL  DATA:  Urinary retention since Monday. Abdominal distention. EXAM: CT ABDOMEN AND PELVIS WITHOUT CONTRAST TECHNIQUE: Multidetector CT imaging of the abdomen and pelvis was performed following the standard protocol without IV contrast. COMPARISON:  None. FINDINGS: Lower chest: Cardiomegaly. No pericardial effusion. Patchy ground-glass opacities with areas of lobular sparing likely related to mild pulmonary edema. No acute pneumonic consolidation, effusion or pneumothorax. Atelectasis is seen at the right lung base. Hepatobiliary: No focal liver abnormality is seen. No gallstones, gallbladder wall thickening, or biliary dilatation. Pancreas: Unremarkable. No pancreatic ductal dilatation or surrounding inflammatory changes. Spleen: Normal in size without focal abnormality. Adrenals/Urinary Tract: 15 mm hypodense left adrenal nodule consistent with a benign adenoma with noncontrast Hounsfield unit of 27. Normal right adrenal gland. Tiny too small to characterize hypodensities within both kidneys consistent with cysts. No obstructive uropathy. No hydroureteronephrosis. The bladder contains a small amount of urine but is largely decompressed. Stomach/Bowel: Stomach is within normal limits. Appendix appears normal. No evidence of bowel wall thickening, distention, or inflammatory changes. Scattered colonic diverticulosis without acute diverticulitis. Vascular/Lymphatic: Extensive aortoiliac and branch vessel atherosclerosis. No aneurysm or adenopathy. Reproductive: Prostate is unremarkable. Other: Bilateral fat containing inguinal hernias. No abdominopelvic ascites. 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 IMPRESSION: 1. A small amount of urine is seen within the decompressed bladder. No obstructive uropathy or nephrolithiasis is noted. Tiny too small to characterize hypodensities within both kidneys statistically consistent with cysts. 2. Extensive aortoiliac and  branch vessel atherosclerosis. 3. Diffuse sclerotic appearance the dorsal spine may be seen with renal osteodystrophy. 4. Cardiomegaly. 5. Diffuse ground-glass opacities with lobular spurring likely to represent stigmata of noncardiogenic pulmonary edema. 6. Bilateral fat containing inguinal hernias. Electronically Signed   By: Ashley Royalty M.D.   On: 11/18/2016 22:34   Dg Chest 2 View  Result Date: 11/26/2016 CLINICAL DATA:  51 year old male with history of chest pain and shortness of breath today. EXAM: CHEST  2 VIEW COMPARISON:  Chest x-ray 08/04/2016. FINDINGS: Elevation of the right hemidiaphragm. Lung volumes are very low which limits assessment. Additionally, there is underpenetration of the image, which also limits the diagnostic sensitivity and specificity of the examination. However, there is no definite consolidative airspace disease. No pleural effusions. Pulmonary vasculature does not appear engorged. Heart size appears mildly enlarged. Upper mediastinal contours are distorted by low lung volumes and patient positioning. IMPRESSION: 1. Low lung volumes without definite radiographic evidence of acute cardiopulmonary disease. 2. Elevation of the right hemidiaphragm, similar to the prior study. Electronically Signed   By: Vinnie Langton M.D.   On: 11/26/2016 17:18       Discharge Exam: Vitals:  11/27/16 0832 11/27/16 1154  BP: (!) 142/94 121/70  Pulse: 81 81  Resp: (!) 28 (!) 21  Temp: 97.6 F (36.4 C) 98 F (36.7 C)  SpO2: 97% 93%   Vitals:   11/26/16 2121 11/27/16 0454 11/27/16 0832 11/27/16 1154  BP: (!) 148/90 137/83 (!) 142/94 121/70  Pulse: 91 82 81 81  Resp: (!) 94 11 (!) 28 (!) 21  Temp: 98.3 F (36.8 C) 98.1 F (36.7 C) 97.6 F (36.4 C) 98 F (36.7 C)  TempSrc: Oral Oral Axillary Oral  SpO2: 92% 91% 97% 93%  Weight: 96.9 kg (213 lb 11.2 oz) 97.8 kg (215 lb 9.6 oz)    Height: 6' (1.829 m)       General: Pt is alert, awake, not in acute distress Cardiovascular:  RRR, S1/S2 +, no rubs, no gallops Respiratory: CTA bilaterally, no wheezing, no rhonchi Abdominal: Soft, NT, ND, bowel sounds + Extremities: no edema, no cyanosis    The results of significant diagnostics from this hospitalization (including imaging, microbiology, ancillary and laboratory) are listed below for reference.     Microbiology: Recent Results (from the past 240 hour(s))  Urine culture     Status: Abnormal   Collection Time: 11/19/16 12:20 AM  Result Value Ref Range Status   Specimen Description URINE, CLEAN CATCH  Final   Special Requests NONE  Final   Culture (A)  Final    30,000 COLONIES/mL ESCHERICHIA COLI Confirmed Extended Spectrum Beta-Lactamase Producer (ESBL) Performed at [facility], [address], [place], [place] [address]    Report Status 11/22/2016 FINAL  Final   Organism ID, Bacteria ESCHERICHIA COLI (A)  Final      Susceptibility   Escherichia coli - MIC*    AMPICILLIN >=32 RESISTANT Resistant     CEFAZOLIN >=64 RESISTANT Resistant     CEFTRIAXONE >=64 RESISTANT Resistant     CIPROFLOXACIN <=0.25 SENSITIVE Sensitive     GENTAMICIN <=1 SENSITIVE Sensitive     IMIPENEM <=0.25 SENSITIVE Sensitive     NITROFURANTOIN 64 INTERMEDIATE Intermediate     TRIMETH/SULFA >=320 RESISTANT Resistant     AMPICILLIN/SULBACTAM 8 SENSITIVE Sensitive     PIP/TAZO <=4 SENSITIVE Sensitive     Extended ESBL POSITIVE Resistant     * 30,000 COLONIES/mL ESCHERICHIA COLI  MRSA PCR Screening     Status: Abnormal   Collection Time: 11/26/16  9:42 PM  Result Value Ref Range Status   MRSA by PCR POSITIVE (A) NEGATIVE Final    Comment:        The GeneXpert MRSA Assay (FDA approved for NASAL specimens only), is one component of a comprehensive MRSA colonization surveillance program. It is not intended to diagnose MRSA infection nor to guide or monitor treatment for MRSA infections. RESULT CALLED TO, READ BACK BY AND VERIFIED WITH: A.TEPE, RN 11/27/16 0005  L.CHAMPION      Labs: BNP (last 3 results)  Recent Labs  08/04/16 1832  BNP 3,557.3*   Basic Metabolic Panel:  Recent Labs Lab 11/26/16 1625 11/26/16 2222  NA 135  --   K 3.9  --   CL 92*  --   CO2 30  --   GLUCOSE 259*  --   BUN 33*  --   CREATININE 9.88* 10.82*  CALCIUM 7.7*  --    Liver Function Tests:  Recent Labs Lab 11/26/16 1625  AST 23  ALT 15*  ALKPHOS 138*  BILITOT 0.9  PROT 8.3*  ALBUMIN 3.6   No  results for input(s): LIPASE, AMYLASE in the last 168 hours. No results for input(s): AMMONIA in the last 168 hours. CBC:  Recent Labs Lab 11/26/16 1625 11/26/16 2222  WBC 5.3 5.3  NEUTROABS 3.1  --   HGB 12.7* 13.2  HCT 38.7* 39.4  MCV 93.5 93.4  PLT 136* 140*   Cardiac Enzymes:  Recent Labs Lab 11/26/16 2222 11/27/16 0306  TROPONINI 0.06* 0.06*   BNP: Invalid input(s): POCBNP CBG:  Recent Labs Lab 11/27/16 0830  GLUCAP 176*   D-Dimer  Recent Labs  11/27/16 0954  DDIMER 2.51*   Hgb A1c No results for input(s): HGBA1C in the last 72 hours. Lipid Profile No results for input(s): CHOL, HDL, LDLCALC, TRIG, CHOLHDL, LDLDIRECT in the last 72 hours. Thyroid function studies No results for input(s): TSH, T4TOTAL, T3FREE, THYROIDAB in the last 72 hours.  Invalid input(s): FREET3 Anemia work up No results for input(s): VITAMINB12, FOLATE, FERRITIN, TIBC, IRON, RETICCTPCT in the last 72 hours. Urinalysis    Component Value Date/Time   COLORURINE RED (A) 11/19/2016 0020   APPEARANCEUR TURBID (A) 11/19/2016 0020   LABSPEC  11/19/2016 0020    TEST NOT REPORTED DUE TO COLOR INTERFERENCE OF URINE PIGMENT   PHURINE  11/19/2016 0020    TEST NOT REPORTED DUE TO COLOR INTERFERENCE OF URINE PIGMENT   GLUCOSEU (A) 11/19/2016 0020    TEST NOT REPORTED DUE TO COLOR INTERFERENCE OF URINE PIGMENT   HGBUR (A) 11/19/2016 0020    TEST NOT REPORTED DUE TO COLOR INTERFERENCE OF URINE PIGMENT   BILIRUBINUR (A) 11/19/2016 0020    TEST NOT  REPORTED DUE TO COLOR INTERFERENCE OF URINE PIGMENT   KETONESUR (A) 11/19/2016 0020    TEST NOT REPORTED DUE TO COLOR INTERFERENCE OF URINE PIGMENT   PROTEINUR (A) 11/19/2016 0020    TEST NOT REPORTED DUE TO COLOR INTERFERENCE OF URINE PIGMENT   NITRITE (A) 11/19/2016 0020    TEST NOT REPORTED DUE TO COLOR INTERFERENCE OF URINE PIGMENT   LEUKOCYTESUR (A) 11/19/2016 0020    TEST NOT REPORTED DUE TO COLOR INTERFERENCE OF URINE PIGMENT   Sepsis Labs Invalid input(s): PROCALCITONIN,  WBC,  LACTICIDVEN Microbiology Recent Results (from the past 240 hour(s))  Urine culture     Status: Abnormal   Collection Time: 11/19/16 12:20 AM  Result Value Ref Range Status   Specimen Description URINE, CLEAN CATCH  Final   Special Requests NONE  Final   Culture (A)  Final    30,000 COLONIES/mL ESCHERICHIA COLI Confirmed Extended Spectrum Beta-Lactamase Producer (ESBL) Performed at [facility], [address] [place], [place] [address]    Report Status 11/22/2016 FINAL  Final   Organism ID, Bacteria ESCHERICHIA COLI (A)  Final      Susceptibility   Escherichia coli - MIC*    AMPICILLIN >=32 RESISTANT Resistant     CEFAZOLIN >=64 RESISTANT Resistant     CEFTRIAXONE >=64 RESISTANT Resistant     CIPROFLOXACIN <=0.25 SENSITIVE Sensitive     GENTAMICIN <=1 SENSITIVE Sensitive     IMIPENEM <=0.25 SENSITIVE Sensitive     NITROFURANTOIN 64 INTERMEDIATE Intermediate     TRIMETH/SULFA >=320 RESISTANT Resistant     AMPICILLIN/SULBACTAM 8 SENSITIVE Sensitive     PIP/TAZO <=4 SENSITIVE Sensitive     Extended ESBL POSITIVE Resistant     * 30,000 COLONIES/mL ESCHERICHIA COLI  MRSA PCR Screening     Status: Abnormal   Collection Time: 11/26/16  9:42 PM  Result Value Ref Range  Status   MRSA by PCR POSITIVE (A) NEGATIVE Final    Comment:        The GeneXpert MRSA Assay (FDA approved for NASAL specimens only), is one component of a comprehensive MRSA colonization surveillance program. It is  not intended to diagnose MRSA infection nor to guide or monitor treatment for MRSA infections. RESULT CALLED TO, READ BACK BY AND VERIFIED WITH: A.TEPE, RN 11/27/16 0005 L.CHAMPION      Time coordinating discharge: Over 30 minutes  SIGNED:   Debbe Odea, MD  [facility] 11/27/2016, 1:47 PM Pager   If 7PM-7AM, please contact night-coverage [email or web address] Password [email or web address]

## 2016-11-29 DIAGNOSIS — E1129 Type 2 diabetes mellitus with other diabetic kidney complication: Secondary | ICD-10-CM | POA: Diagnosis not present

## 2016-11-29 DIAGNOSIS — N2581 Secondary hyperparathyroidism of renal origin: Secondary | ICD-10-CM | POA: Diagnosis not present

## 2016-11-29 DIAGNOSIS — Z23 Encounter for immunization: Secondary | ICD-10-CM | POA: Diagnosis not present

## 2016-11-29 DIAGNOSIS — N186 End stage renal disease: Secondary | ICD-10-CM | POA: Diagnosis not present

## 2016-11-29 LAB — GLUCOSE, CAPILLARY: Glucose-Capillary: 164 mg/dL — ABNORMAL HIGH (ref 65–99)

## 2016-12-01 DIAGNOSIS — E1129 Type 2 diabetes mellitus with other diabetic kidney complication: Secondary | ICD-10-CM | POA: Diagnosis not present

## 2016-12-01 DIAGNOSIS — N2581 Secondary hyperparathyroidism of renal origin: Secondary | ICD-10-CM | POA: Diagnosis not present

## 2016-12-01 DIAGNOSIS — N186 End stage renal disease: Secondary | ICD-10-CM | POA: Diagnosis not present

## 2016-12-01 DIAGNOSIS — Z23 Encounter for immunization: Secondary | ICD-10-CM | POA: Diagnosis not present

## 2016-12-03 DIAGNOSIS — N2581 Secondary hyperparathyroidism of renal origin: Secondary | ICD-10-CM | POA: Diagnosis not present

## 2016-12-03 DIAGNOSIS — Z23 Encounter for immunization: Secondary | ICD-10-CM | POA: Diagnosis not present

## 2016-12-03 DIAGNOSIS — E1129 Type 2 diabetes mellitus with other diabetic kidney complication: Secondary | ICD-10-CM | POA: Diagnosis not present

## 2016-12-03 DIAGNOSIS — N186 End stage renal disease: Secondary | ICD-10-CM | POA: Diagnosis not present

## 2016-12-06 DIAGNOSIS — N2581 Secondary hyperparathyroidism of renal origin: Secondary | ICD-10-CM | POA: Diagnosis not present

## 2016-12-06 DIAGNOSIS — N186 End stage renal disease: Secondary | ICD-10-CM | POA: Diagnosis not present

## 2016-12-06 DIAGNOSIS — E1129 Type 2 diabetes mellitus with other diabetic kidney complication: Secondary | ICD-10-CM | POA: Diagnosis not present

## 2016-12-06 DIAGNOSIS — Z23 Encounter for immunization: Secondary | ICD-10-CM | POA: Diagnosis not present

## 2016-12-08 DIAGNOSIS — N2581 Secondary hyperparathyroidism of renal origin: Secondary | ICD-10-CM | POA: Diagnosis not present

## 2016-12-08 DIAGNOSIS — N186 End stage renal disease: Secondary | ICD-10-CM | POA: Diagnosis not present

## 2016-12-08 DIAGNOSIS — E1129 Type 2 diabetes mellitus with other diabetic kidney complication: Secondary | ICD-10-CM | POA: Diagnosis not present

## 2016-12-08 DIAGNOSIS — Z23 Encounter for immunization: Secondary | ICD-10-CM | POA: Diagnosis not present

## 2016-12-10 DIAGNOSIS — Z23 Encounter for immunization: Secondary | ICD-10-CM | POA: Diagnosis not present

## 2016-12-10 DIAGNOSIS — N186 End stage renal disease: Secondary | ICD-10-CM | POA: Diagnosis not present

## 2016-12-10 DIAGNOSIS — N2581 Secondary hyperparathyroidism of renal origin: Secondary | ICD-10-CM | POA: Diagnosis not present

## 2016-12-10 DIAGNOSIS — E1129 Type 2 diabetes mellitus with other diabetic kidney complication: Secondary | ICD-10-CM | POA: Diagnosis not present

## 2016-12-13 DIAGNOSIS — Z23 Encounter for immunization: Secondary | ICD-10-CM | POA: Diagnosis not present

## 2016-12-13 DIAGNOSIS — N2581 Secondary hyperparathyroidism of renal origin: Secondary | ICD-10-CM | POA: Diagnosis not present

## 2016-12-13 DIAGNOSIS — E1129 Type 2 diabetes mellitus with other diabetic kidney complication: Secondary | ICD-10-CM | POA: Diagnosis not present

## 2016-12-13 DIAGNOSIS — N186 End stage renal disease: Secondary | ICD-10-CM | POA: Diagnosis not present

## 2016-12-15 DIAGNOSIS — N186 End stage renal disease: Secondary | ICD-10-CM | POA: Diagnosis not present

## 2016-12-15 DIAGNOSIS — Z23 Encounter for immunization: Secondary | ICD-10-CM | POA: Diagnosis not present

## 2016-12-15 DIAGNOSIS — E1129 Type 2 diabetes mellitus with other diabetic kidney complication: Secondary | ICD-10-CM | POA: Diagnosis not present

## 2016-12-15 DIAGNOSIS — N2581 Secondary hyperparathyroidism of renal origin: Secondary | ICD-10-CM | POA: Diagnosis not present

## 2016-12-17 DIAGNOSIS — Z23 Encounter for immunization: Secondary | ICD-10-CM | POA: Diagnosis not present

## 2016-12-17 DIAGNOSIS — N2581 Secondary hyperparathyroidism of renal origin: Secondary | ICD-10-CM | POA: Diagnosis not present

## 2016-12-17 DIAGNOSIS — N186 End stage renal disease: Secondary | ICD-10-CM | POA: Diagnosis not present

## 2016-12-17 DIAGNOSIS — E1129 Type 2 diabetes mellitus with other diabetic kidney complication: Secondary | ICD-10-CM | POA: Diagnosis not present

## 2016-12-19 DIAGNOSIS — E1129 Type 2 diabetes mellitus with other diabetic kidney complication: Secondary | ICD-10-CM | POA: Diagnosis not present

## 2016-12-19 DIAGNOSIS — Z992 Dependence on renal dialysis: Secondary | ICD-10-CM | POA: Diagnosis not present

## 2016-12-19 DIAGNOSIS — N186 End stage renal disease: Secondary | ICD-10-CM | POA: Diagnosis not present

## 2016-12-22 DIAGNOSIS — N186 End stage renal disease: Secondary | ICD-10-CM | POA: Diagnosis not present

## 2016-12-22 DIAGNOSIS — N2581 Secondary hyperparathyroidism of renal origin: Secondary | ICD-10-CM | POA: Diagnosis not present

## 2016-12-22 DIAGNOSIS — E1129 Type 2 diabetes mellitus with other diabetic kidney complication: Secondary | ICD-10-CM | POA: Diagnosis not present

## 2016-12-23 ENCOUNTER — Institutional Professional Consult (permissible substitution): Payer: Medicare Other | Admitting: Endocrinology

## 2016-12-24 DIAGNOSIS — E1129 Type 2 diabetes mellitus with other diabetic kidney complication: Secondary | ICD-10-CM | POA: Diagnosis not present

## 2016-12-24 DIAGNOSIS — N2581 Secondary hyperparathyroidism of renal origin: Secondary | ICD-10-CM | POA: Diagnosis not present

## 2016-12-24 DIAGNOSIS — N186 End stage renal disease: Secondary | ICD-10-CM | POA: Diagnosis not present

## 2016-12-27 DIAGNOSIS — N2581 Secondary hyperparathyroidism of renal origin: Secondary | ICD-10-CM | POA: Diagnosis not present

## 2016-12-27 DIAGNOSIS — N186 End stage renal disease: Secondary | ICD-10-CM | POA: Diagnosis not present

## 2016-12-27 DIAGNOSIS — E1129 Type 2 diabetes mellitus with other diabetic kidney complication: Secondary | ICD-10-CM | POA: Diagnosis not present

## 2016-12-29 DIAGNOSIS — N186 End stage renal disease: Secondary | ICD-10-CM | POA: Diagnosis not present

## 2016-12-29 DIAGNOSIS — N2581 Secondary hyperparathyroidism of renal origin: Secondary | ICD-10-CM | POA: Diagnosis not present

## 2016-12-29 DIAGNOSIS — E1129 Type 2 diabetes mellitus with other diabetic kidney complication: Secondary | ICD-10-CM | POA: Diagnosis not present

## 2016-12-31 DIAGNOSIS — N2581 Secondary hyperparathyroidism of renal origin: Secondary | ICD-10-CM | POA: Diagnosis not present

## 2016-12-31 DIAGNOSIS — E1129 Type 2 diabetes mellitus with other diabetic kidney complication: Secondary | ICD-10-CM | POA: Diagnosis not present

## 2016-12-31 DIAGNOSIS — N186 End stage renal disease: Secondary | ICD-10-CM | POA: Diagnosis not present

## 2017-01-04 DIAGNOSIS — E1129 Type 2 diabetes mellitus with other diabetic kidney complication: Secondary | ICD-10-CM | POA: Diagnosis not present

## 2017-01-04 DIAGNOSIS — N2581 Secondary hyperparathyroidism of renal origin: Secondary | ICD-10-CM | POA: Diagnosis not present

## 2017-01-04 DIAGNOSIS — N186 End stage renal disease: Secondary | ICD-10-CM | POA: Diagnosis not present

## 2017-01-05 DIAGNOSIS — N186 End stage renal disease: Secondary | ICD-10-CM | POA: Diagnosis not present

## 2017-01-05 DIAGNOSIS — E1129 Type 2 diabetes mellitus with other diabetic kidney complication: Secondary | ICD-10-CM | POA: Diagnosis not present

## 2017-01-05 DIAGNOSIS — N2581 Secondary hyperparathyroidism of renal origin: Secondary | ICD-10-CM | POA: Diagnosis not present

## 2017-01-07 DIAGNOSIS — E1129 Type 2 diabetes mellitus with other diabetic kidney complication: Secondary | ICD-10-CM | POA: Diagnosis not present

## 2017-01-07 DIAGNOSIS — N186 End stage renal disease: Secondary | ICD-10-CM | POA: Diagnosis not present

## 2017-01-07 DIAGNOSIS — N2581 Secondary hyperparathyroidism of renal origin: Secondary | ICD-10-CM | POA: Diagnosis not present

## 2017-01-10 DIAGNOSIS — N186 End stage renal disease: Secondary | ICD-10-CM | POA: Diagnosis not present

## 2017-01-10 DIAGNOSIS — N2581 Secondary hyperparathyroidism of renal origin: Secondary | ICD-10-CM | POA: Diagnosis not present

## 2017-01-10 DIAGNOSIS — E1129 Type 2 diabetes mellitus with other diabetic kidney complication: Secondary | ICD-10-CM | POA: Diagnosis not present

## 2017-01-12 DIAGNOSIS — N186 End stage renal disease: Secondary | ICD-10-CM | POA: Diagnosis not present

## 2017-01-12 DIAGNOSIS — E1129 Type 2 diabetes mellitus with other diabetic kidney complication: Secondary | ICD-10-CM | POA: Diagnosis not present

## 2017-01-12 DIAGNOSIS — N2581 Secondary hyperparathyroidism of renal origin: Secondary | ICD-10-CM | POA: Diagnosis not present

## 2017-01-14 DIAGNOSIS — N2581 Secondary hyperparathyroidism of renal origin: Secondary | ICD-10-CM | POA: Diagnosis not present

## 2017-01-14 DIAGNOSIS — N186 End stage renal disease: Secondary | ICD-10-CM | POA: Diagnosis not present

## 2017-01-14 DIAGNOSIS — E1129 Type 2 diabetes mellitus with other diabetic kidney complication: Secondary | ICD-10-CM | POA: Diagnosis not present

## 2017-01-17 DIAGNOSIS — N186 End stage renal disease: Secondary | ICD-10-CM | POA: Diagnosis not present

## 2017-01-17 DIAGNOSIS — N2581 Secondary hyperparathyroidism of renal origin: Secondary | ICD-10-CM | POA: Diagnosis not present

## 2017-01-17 DIAGNOSIS — E1129 Type 2 diabetes mellitus with other diabetic kidney complication: Secondary | ICD-10-CM | POA: Diagnosis not present

## 2017-01-19 DIAGNOSIS — N186 End stage renal disease: Secondary | ICD-10-CM | POA: Diagnosis not present

## 2017-01-19 DIAGNOSIS — Z992 Dependence on renal dialysis: Secondary | ICD-10-CM | POA: Diagnosis not present

## 2017-01-19 DIAGNOSIS — N2581 Secondary hyperparathyroidism of renal origin: Secondary | ICD-10-CM | POA: Diagnosis not present

## 2017-01-19 DIAGNOSIS — E1129 Type 2 diabetes mellitus with other diabetic kidney complication: Secondary | ICD-10-CM | POA: Diagnosis not present

## 2017-01-20 ENCOUNTER — Encounter: Payer: Self-pay | Admitting: Endocrinology

## 2017-01-20 ENCOUNTER — Telehealth: Payer: Self-pay

## 2017-01-20 ENCOUNTER — Ambulatory Visit (INDEPENDENT_AMBULATORY_CARE_PROVIDER_SITE_OTHER): Payer: Medicare Other | Admitting: Endocrinology

## 2017-01-20 VITALS — BP 162/90 | HR 102 | Wt 222.2 lb

## 2017-01-20 DIAGNOSIS — E118 Type 2 diabetes mellitus with unspecified complications: Secondary | ICD-10-CM | POA: Diagnosis not present

## 2017-01-20 LAB — POCT GLYCOSYLATED HEMOGLOBIN (HGB A1C): Hemoglobin A1C: 8.5

## 2017-01-20 MED ORDER — INSULIN LISPRO 100 UNIT/ML (KWIKPEN): 6.0000 [IU] | PEN_INJECTOR | SUBCUTANEOUS | 11 refills | Status: DC

## 2017-01-20 NOTE — Progress Notes (Signed)
Subjective:    Patient ID: Howard Bell, male    DOB: 19-Nov-1965, 51 y.o.   MRN: [id number]  HPI pt is referred by Dr Howard Bell, for diabetes.  Pt states DM was dx'ed in 1994; he has moderate neuropathy of the lower extremities; he has associated ESRD; he has been on insulin since soon after dx; pt says his diet is fair, but exercise is poor; he has never had pancreatitis, pancreatic surgery, last episode of severe hypoglycemia was in 2008.  Last episode of DKA was 2003. He takes prn humalog only.  He averages a total of approx 10-15 units total per day.  He has mild hypoglycemia approx once per 3 months.  He often skips breakfast on dialysis days.  Past Medical History:  Diagnosis Date  . Congestive heart failure (CHF) ([place])   . 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 ESRD (end stage renal disease) on dialysis ([place])   . Hypertension     Past Surgical History:  Procedure Laterality Date  . HERNIA REPAIR      Social History   Social History  . Marital status: Married    Spouse name: N/A  . Number of children: N/A  . Years of education: N/A   Occupational History  . Not on file.   Social History Main Topics  . Smoking status: Never Smoker  . Smokeless tobacco: Never Used  . Alcohol use No  . Drug use: No  . Sexual activity: Not on file   Other Topics Concern  . Not on file   Social History Narrative  . No narrative on file    Current Outpatient Prescriptions on File Prior to Visit  Medication Sig Dispense Refill  . amLODipine (NORVASC) 10 MG tablet Take 10 mg by mouth daily.     Marland Kitchen aspirin EC 81 MG tablet Take 1 tablet (81 mg total) by mouth daily. 30 tablet 0  . brimonidine (ALPHAGAN) 0.2 % ophthalmic solution Place 1 drop into both eyes 2 (two) times daily.    . diclofenac sodium (VOLTAREN) 1 % GEL Apply 4 g topically 4 (four) times daily.    . metoprolol succinate (TOPROL-XL) 100 MG 24 hr tablet Take 1 tablet (100 mg total) by mouth  daily. Take with or immediately following a meal.    . timolol (BETIMOL) 0.5 % ophthalmic solution Place 1 drop into both eyes 2 (two) times daily.    . travoprost, benzalkonium, (TRAVATAN) 0.004 % ophthalmic solution Place 1 drop into both eyes at bedtime.     No current facility-administered medications on file prior to visit.     No Known Allergies  Family History  Problem Relation Age of Onset  . Diabetes Mother   . Hypertension Father     BP (!) 162/90   Pulse (!) 102   Wt 222 lb 3.2 oz (100.8 kg)   SpO2 96%   BMI 30.14 kg/m    Review of Systems denies weight loss, blurry vision, headache, sob, n/v, excessive diaphoresis, memory loss, depression, cold intolerance, rhinorrhea, and easy bruising.   He has intermitt leg cramps.  Chest pain is resolved.  He is anuric.     Objective:   Physical Exam VS: see vs page GEN: no distress HEAD: head: no deformity eyes: no periorbital swelling, no proptosis external nose and ears are normal mouth: no lesion seen NECK: supple, thyroid is not enlarged CHEST WALL: no deformity LUNGS: clear to auscultation CV: reg rate and  rhythm, no murmur ABD: abdomen is soft, nontender.  no hepatosplenomegaly.  not distended.  Self-reducing ventral hernia MUSCULOSKELETAL: muscle bulk and strength are grossly normal.  no obvious joint swelling.  gait is normal and steady EXTEMITIES: no deformity.  Healing ulcer on the plantar aspect of the left foot.  Skin on the feet is dry.  feet are of normal color and temp.  no leg edema.  There is bilateral onychomycosis of the toenails.  PULSES: dorsalis pedis intact bilat.  no carotid bruit NEURO:  cn 2-12 grossly intact.   readily moves all 4's.  sensation is intact to touch on the feet, but decreased from normal.  SKIN:  Normal texture and temperature.  No rash or suspicious lesion is visible.   NODES:  None palpable at the neck PSYCH: alert, well-oriented.  Does not appear anxious nor depressed.   Lab  Results  Component Value Date   CREATININE 10.82 (H) 11/26/2016   BUN 33 (H) 11/26/2016   NA 135 11/26/2016   K 3.9 11/26/2016   CL 92 (L) 11/26/2016   CO2 30 11/26/2016   Lab Results  Component Value Date   HGBA1C 8.5 01/20/2017   I have reviewed outside records, and summarized: Pt was noted to have elevated a1c, and referred here.  He was in the hospital for chest pain, but no cardiac cause was found.     Assessment & Plan:  Type 1 DM: he needs increased rx.  We discussed.  He agrees to take multiple daily injections.    Patient Instructions  good diet and exercise significantly improve the control of your diabetes.  please let me know if you wish to be referred to a dietician.  high blood sugar is very risky to your health.  you should see an eye doctor and dentist every year.  It is very important to get all recommended vaccinations.  Controlling your blood pressure and cholesterol drastically reduces the damage diabetes does to your body.  Those who smoke should quit.  Please discuss these with your doctor.  check your blood sugar twice a day.  vary the time of day when you check, between before the 3 meals, and at bedtime.  also check if you have symptoms of your blood sugar being too high or too low.  please keep a record of the readings and bring it to your next appointment here (or you can bring the meter itself).  You can write it on any piece of paper.  please call us sooner if your blood sugar goes below 70, or if you have a lot of readings over 200. I have sent a prescription to your pharmacy, to take the humalog, 6 units 3 times a day (just before each meal).  If you skip a meal, skip the humalog shot. please call [phone] ([facility] physician referral line), to get an appointment with a new primary provider.  Please come back for a follow-up appointment in 1 month.

## 2017-01-20 NOTE — Patient Instructions (Addendum)
good diet and exercise significantly improve the control of your diabetes.  please let me know if you wish to be referred to a dietician.  high blood sugar is very risky to your health.  you should see an eye doctor and dentist every year.  It is very important to get all recommended vaccinations.  Controlling your blood pressure and cholesterol drastically reduces the damage diabetes does to your body.  Those who smoke should quit.  Please discuss these with your doctor.  check your blood sugar twice a day.  vary the time of day when you check, between before the 3 meals, and at bedtime.  also check if you have symptoms of your blood sugar being too high or too low.  please keep a record of the readings and bring it to your next appointment here (or you can bring the meter itself).  You can write it on any piece of paper.  please call us sooner if your blood sugar goes below 70, or if you have a lot of readings over 200. I have sent a prescription to your pharmacy, to take the humalog, 6 units 3 times a day (just before each meal).  If you skip a meal, skip the humalog shot. please call [phone] ([facility] physician referral line), to get an appointment with a new primary provider.  Please come back for a follow-up appointment in 1 month.

## 2017-01-20 NOTE — Telephone Encounter (Signed)
Patient was seen today and after visit he checked out and was going through our door to leave he stumbled and fell to the ground- patient seemed disoriented and was unable to give [place] much information- we were able to get him up in a sitting position in a chair. Tried to get some general information from the patient, but he seemed to be confused and was unable to state where he lived- patient called [facility] to come pick him up and was on the phone when I came back out into the lobby to test his blood sugar- his blood sugar was 278 which the patient stated was high for him- patient left quickly before we were able to come and get you- this is an FYI we are doing safety zone portal for this patient

## 2017-01-21 DIAGNOSIS — N186 End stage renal disease: Secondary | ICD-10-CM | POA: Diagnosis not present

## 2017-01-21 DIAGNOSIS — E1129 Type 2 diabetes mellitus with other diabetic kidney complication: Secondary | ICD-10-CM | POA: Diagnosis not present

## 2017-01-21 DIAGNOSIS — N2581 Secondary hyperparathyroidism of renal origin: Secondary | ICD-10-CM | POA: Diagnosis not present

## 2017-01-24 DIAGNOSIS — N186 End stage renal disease: Secondary | ICD-10-CM | POA: Diagnosis not present

## 2017-01-24 DIAGNOSIS — E1129 Type 2 diabetes mellitus with other diabetic kidney complication: Secondary | ICD-10-CM | POA: Diagnosis not present

## 2017-01-24 DIAGNOSIS — N2581 Secondary hyperparathyroidism of renal origin: Secondary | ICD-10-CM | POA: Diagnosis not present

## 2017-01-26 DIAGNOSIS — N2581 Secondary hyperparathyroidism of renal origin: Secondary | ICD-10-CM | POA: Diagnosis not present

## 2017-01-26 DIAGNOSIS — N186 End stage renal disease: Secondary | ICD-10-CM | POA: Diagnosis not present

## 2017-01-26 DIAGNOSIS — E1129 Type 2 diabetes mellitus with other diabetic kidney complication: Secondary | ICD-10-CM | POA: Diagnosis not present

## 2017-01-28 DIAGNOSIS — N2581 Secondary hyperparathyroidism of renal origin: Secondary | ICD-10-CM | POA: Diagnosis not present

## 2017-01-28 DIAGNOSIS — E1129 Type 2 diabetes mellitus with other diabetic kidney complication: Secondary | ICD-10-CM | POA: Diagnosis not present

## 2017-01-28 DIAGNOSIS — N186 End stage renal disease: Secondary | ICD-10-CM | POA: Diagnosis not present

## 2017-01-31 DIAGNOSIS — E1129 Type 2 diabetes mellitus with other diabetic kidney complication: Secondary | ICD-10-CM | POA: Diagnosis not present

## 2017-01-31 DIAGNOSIS — N186 End stage renal disease: Secondary | ICD-10-CM | POA: Diagnosis not present

## 2017-01-31 DIAGNOSIS — N2581 Secondary hyperparathyroidism of renal origin: Secondary | ICD-10-CM | POA: Diagnosis not present

## 2017-02-02 DIAGNOSIS — N186 End stage renal disease: Secondary | ICD-10-CM | POA: Diagnosis not present

## 2017-02-02 DIAGNOSIS — N2581 Secondary hyperparathyroidism of renal origin: Secondary | ICD-10-CM | POA: Diagnosis not present

## 2017-02-02 DIAGNOSIS — E1129 Type 2 diabetes mellitus with other diabetic kidney complication: Secondary | ICD-10-CM | POA: Diagnosis not present

## 2017-02-04 DIAGNOSIS — N2581 Secondary hyperparathyroidism of renal origin: Secondary | ICD-10-CM | POA: Diagnosis not present

## 2017-02-04 DIAGNOSIS — N186 End stage renal disease: Secondary | ICD-10-CM | POA: Diagnosis not present

## 2017-02-04 DIAGNOSIS — E1129 Type 2 diabetes mellitus with other diabetic kidney complication: Secondary | ICD-10-CM | POA: Diagnosis not present

## 2017-02-06 DIAGNOSIS — N186 End stage renal disease: Secondary | ICD-10-CM | POA: Diagnosis not present

## 2017-02-06 DIAGNOSIS — E1129 Type 2 diabetes mellitus with other diabetic kidney complication: Secondary | ICD-10-CM | POA: Diagnosis not present

## 2017-02-06 DIAGNOSIS — N2581 Secondary hyperparathyroidism of renal origin: Secondary | ICD-10-CM | POA: Diagnosis not present

## 2017-02-08 DIAGNOSIS — E1129 Type 2 diabetes mellitus with other diabetic kidney complication: Secondary | ICD-10-CM | POA: Diagnosis not present

## 2017-02-08 DIAGNOSIS — N2581 Secondary hyperparathyroidism of renal origin: Secondary | ICD-10-CM | POA: Diagnosis not present

## 2017-02-08 DIAGNOSIS — N186 End stage renal disease: Secondary | ICD-10-CM | POA: Diagnosis not present

## 2017-02-12 DIAGNOSIS — N2581 Secondary hyperparathyroidism of renal origin: Secondary | ICD-10-CM | POA: Diagnosis not present

## 2017-02-12 DIAGNOSIS — N186 End stage renal disease: Secondary | ICD-10-CM | POA: Diagnosis not present

## 2017-02-14 DIAGNOSIS — N186 End stage renal disease: Secondary | ICD-10-CM | POA: Diagnosis not present

## 2017-02-14 DIAGNOSIS — E1129 Type 2 diabetes mellitus with other diabetic kidney complication: Secondary | ICD-10-CM | POA: Diagnosis not present

## 2017-02-14 DIAGNOSIS — N2581 Secondary hyperparathyroidism of renal origin: Secondary | ICD-10-CM | POA: Diagnosis not present

## 2017-02-18 DIAGNOSIS — N186 End stage renal disease: Secondary | ICD-10-CM | POA: Diagnosis not present

## 2017-02-18 DIAGNOSIS — N2581 Secondary hyperparathyroidism of renal origin: Secondary | ICD-10-CM | POA: Diagnosis not present

## 2017-02-18 DIAGNOSIS — Z992 Dependence on renal dialysis: Secondary | ICD-10-CM | POA: Diagnosis not present

## 2017-02-18 DIAGNOSIS — E1129 Type 2 diabetes mellitus with other diabetic kidney complication: Secondary | ICD-10-CM | POA: Diagnosis not present

## 2017-02-21 DIAGNOSIS — N186 End stage renal disease: Secondary | ICD-10-CM | POA: Diagnosis not present

## 2017-02-21 DIAGNOSIS — N2581 Secondary hyperparathyroidism of renal origin: Secondary | ICD-10-CM | POA: Diagnosis not present

## 2017-02-21 DIAGNOSIS — E1129 Type 2 diabetes mellitus with other diabetic kidney complication: Secondary | ICD-10-CM | POA: Diagnosis not present

## 2017-02-22 ENCOUNTER — Ambulatory Visit (INDEPENDENT_AMBULATORY_CARE_PROVIDER_SITE_OTHER): Payer: Medicare Other | Admitting: Endocrinology

## 2017-02-22 ENCOUNTER — Encounter: Payer: Self-pay | Admitting: Endocrinology

## 2017-02-22 VITALS — BP 116/75 | HR 96 | Wt 227.6 lb

## 2017-02-22 DIAGNOSIS — E118 Type 2 diabetes mellitus with unspecified complications: Secondary | ICD-10-CM | POA: Diagnosis not present

## 2017-02-22 NOTE — Progress Notes (Signed)
Subjective:    Patient ID: Howard Bell, male    DOB: [birth year], 51 y.o.   MRN: [id number]  HPI Pt returns for f/u of diabetes mellitus: DM type: 1 Dx'ed: 6945 Complications: polyneuropathy and ESRD (HD) Therapy: insulin since soon after dx.  DKA: last episode was 2003 Severe hypoglycemia: last episode was in 2008 Pancreatitis: never Pancreatic imaging:  Other: he takes multiple daily injections Interval history: Pt says he never misses the insulin.  no cbg record, but states cbg's vary from 72 (afternoon) to 199 (HS).  Past Medical History:  Diagnosis Date  . Congestive heart failure (CHF) ([place])   . 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 ESRD (end stage renal disease) on dialysis ([place])   . Hypertension     Past Surgical History:  Procedure Laterality Date  . HERNIA REPAIR      Social History   Socioeconomic History  . Marital status: Married    Spouse name: Not on file  . Number of children: Not on file  . Years of education: Not on file  . Highest education level: Not on file  Social Needs  . Financial resource strain: Not on file  . Food insecurity - worry: Not on file  . Food insecurity - inability: Not on file  . Transportation needs - medical: Not on file  . Transportation needs - non-medical: Not on file  Occupational History  . Not on file  Tobacco Use  . Smoking status: Never Smoker  . Smokeless tobacco: Never Used  Substance and Sexual Activity  . Alcohol use: No  . Drug use: No  . Sexual activity: Not on file  Other Topics Concern  . Not on file  Social History Narrative  . Not on file    Current Outpatient Medications on File Prior to Visit  Medication Sig Dispense Refill  . amLODipine (NORVASC) 10 MG tablet Take 10 mg by mouth daily.     Bell Kitchen aspirin EC 81 MG tablet Take 1 tablet (81 mg total) by mouth daily. 30 tablet 0  . brimonidine (ALPHAGAN) 0.2 % ophthalmic solution Place 1 drop into both eyes 2 (two)  times daily.    . diclofenac sodium (VOLTAREN) 1 % GEL Apply 4 g topically 4 (four) times daily.    . metoprolol succinate (TOPROL-XL) 100 MG 24 hr tablet Take 1 tablet (100 mg total) by mouth daily. Take with or immediately following a meal.    . timolol (BETIMOL) 0.5 % ophthalmic solution Place 1 drop into both eyes 2 (two) times daily.    . travoprost, benzalkonium, (TRAVATAN) 0.004 % ophthalmic solution Place 1 drop into both eyes at bedtime.     No current facility-administered medications on file prior to visit.     No Known Allergies  Family History  Problem Relation Age of Onset  . Diabetes Mother   . Hypertension Father     BP 116/75 (BP Location: Right Wrist, Patient Position: Sitting, Cuff Size: Normal)   Pulse 96   Wt 227 lb 9.6 oz (103.2 kg)   SpO2 96%   BMI 30.87 kg/m    Review of Systems He denies hypoglycemia.     Objective:   Physical Exam VITAL SIGNS:  See vs page GENERAL: no distress Foot PE is declined.    Fructosamine=563    Assessment & Plan:  Type 1 DM: worse Noncompliance with cbg recording, getting PCP, and he is late for appt today. These compromise the  care of his DM. Increase humalog to 10 units 3 times a day (just before each meal).  He may need a simpler insulin regimen

## 2017-02-22 NOTE — Patient Instructions (Addendum)
check your blood sugar twice a day.  vary the time of day when you check, between before the 3 meals, and at bedtime.  also check if you have symptoms of your blood sugar being too high or too low.  please keep a record of the readings and bring it to your next appointment here (or you can bring the meter itself).  You can write it on any piece of paper.  please call us sooner if your blood sugar goes below 70, or if you have a lot of readings over 200. blood tests are requested for you today.  We'll let you know about the results.   Please come back for a follow-up appointment in 2 months.

## 2017-02-23 DIAGNOSIS — N186 End stage renal disease: Secondary | ICD-10-CM | POA: Diagnosis not present

## 2017-02-23 DIAGNOSIS — E1129 Type 2 diabetes mellitus with other diabetic kidney complication: Secondary | ICD-10-CM | POA: Diagnosis not present

## 2017-02-23 DIAGNOSIS — N2581 Secondary hyperparathyroidism of renal origin: Secondary | ICD-10-CM | POA: Diagnosis not present

## 2017-02-23 LAB — FRUCTOSAMINE: Fructosamine: 563 umol/L — ABNORMAL HIGH (ref 190–270)

## 2017-02-23 MED ORDER — INSULIN LISPRO 100 UNIT/ML (KWIKPEN): 10.0000 [IU] | PEN_INJECTOR | Freq: Three times a day (TID) | SUBCUTANEOUS | 11 refills | Status: DC

## 2017-02-25 DIAGNOSIS — N186 End stage renal disease: Secondary | ICD-10-CM | POA: Diagnosis not present

## 2017-02-25 DIAGNOSIS — N2581 Secondary hyperparathyroidism of renal origin: Secondary | ICD-10-CM | POA: Diagnosis not present

## 2017-02-25 DIAGNOSIS — E1129 Type 2 diabetes mellitus with other diabetic kidney complication: Secondary | ICD-10-CM | POA: Diagnosis not present

## 2017-03-02 DIAGNOSIS — E1129 Type 2 diabetes mellitus with other diabetic kidney complication: Secondary | ICD-10-CM | POA: Diagnosis not present

## 2017-03-02 DIAGNOSIS — N186 End stage renal disease: Secondary | ICD-10-CM | POA: Diagnosis not present

## 2017-03-02 DIAGNOSIS — N2581 Secondary hyperparathyroidism of renal origin: Secondary | ICD-10-CM | POA: Diagnosis not present

## 2017-03-03 ENCOUNTER — Telehealth: Payer: Self-pay | Admitting: Endocrinology

## 2017-03-03 NOTE — Telephone Encounter (Signed)
Called and reviewed lab results with patient

## 2017-03-03 NOTE — Telephone Encounter (Signed)
Patient is calling for lab results.

## 2017-03-03 NOTE — Telephone Encounter (Signed)
Patient returning your call.

## 2017-03-03 NOTE — Telephone Encounter (Signed)
I tried returning patient's call again but had to leave VM.

## 2017-03-04 DIAGNOSIS — E1129 Type 2 diabetes mellitus with other diabetic kidney complication: Secondary | ICD-10-CM | POA: Diagnosis not present

## 2017-03-04 DIAGNOSIS — N2581 Secondary hyperparathyroidism of renal origin: Secondary | ICD-10-CM | POA: Diagnosis not present

## 2017-03-04 DIAGNOSIS — N186 End stage renal disease: Secondary | ICD-10-CM | POA: Diagnosis not present

## 2017-03-07 DIAGNOSIS — E1129 Type 2 diabetes mellitus with other diabetic kidney complication: Secondary | ICD-10-CM | POA: Diagnosis not present

## 2017-03-07 DIAGNOSIS — N2581 Secondary hyperparathyroidism of renal origin: Secondary | ICD-10-CM | POA: Diagnosis not present

## 2017-03-07 DIAGNOSIS — N186 End stage renal disease: Secondary | ICD-10-CM | POA: Diagnosis not present

## 2017-03-09 DIAGNOSIS — N186 End stage renal disease: Secondary | ICD-10-CM | POA: Diagnosis not present

## 2017-03-09 DIAGNOSIS — E1129 Type 2 diabetes mellitus with other diabetic kidney complication: Secondary | ICD-10-CM | POA: Diagnosis not present

## 2017-03-09 DIAGNOSIS — N2581 Secondary hyperparathyroidism of renal origin: Secondary | ICD-10-CM | POA: Diagnosis not present

## 2017-03-13 DIAGNOSIS — Z992 Dependence on renal dialysis: Secondary | ICD-10-CM | POA: Diagnosis not present

## 2017-03-13 DIAGNOSIS — N2581 Secondary hyperparathyroidism of renal origin: Secondary | ICD-10-CM | POA: Diagnosis not present

## 2017-03-13 DIAGNOSIS — N186 End stage renal disease: Secondary | ICD-10-CM | POA: Diagnosis not present

## 2017-03-16 DIAGNOSIS — N2581 Secondary hyperparathyroidism of renal origin: Secondary | ICD-10-CM | POA: Diagnosis not present

## 2017-03-16 DIAGNOSIS — N186 End stage renal disease: Secondary | ICD-10-CM | POA: Diagnosis not present

## 2017-03-18 DIAGNOSIS — E1129 Type 2 diabetes mellitus with other diabetic kidney complication: Secondary | ICD-10-CM | POA: Diagnosis not present

## 2017-03-18 DIAGNOSIS — N186 End stage renal disease: Secondary | ICD-10-CM | POA: Diagnosis not present

## 2017-03-18 DIAGNOSIS — N2581 Secondary hyperparathyroidism of renal origin: Secondary | ICD-10-CM | POA: Diagnosis not present

## 2017-03-20 DIAGNOSIS — N186 End stage renal disease: Secondary | ICD-10-CM | POA: Diagnosis not present

## 2017-03-20 DIAGNOSIS — E1129 Type 2 diabetes mellitus with other diabetic kidney complication: Secondary | ICD-10-CM | POA: Diagnosis not present

## 2017-03-20 DIAGNOSIS — N2581 Secondary hyperparathyroidism of renal origin: Secondary | ICD-10-CM | POA: Diagnosis not present

## 2017-03-21 DIAGNOSIS — E1129 Type 2 diabetes mellitus with other diabetic kidney complication: Secondary | ICD-10-CM | POA: Diagnosis not present

## 2017-03-21 DIAGNOSIS — N186 End stage renal disease: Secondary | ICD-10-CM | POA: Diagnosis not present

## 2017-03-21 DIAGNOSIS — Z992 Dependence on renal dialysis: Secondary | ICD-10-CM | POA: Diagnosis not present

## 2017-03-23 DIAGNOSIS — D509 Iron deficiency anemia, unspecified: Secondary | ICD-10-CM | POA: Diagnosis not present

## 2017-03-23 DIAGNOSIS — N186 End stage renal disease: Secondary | ICD-10-CM | POA: Diagnosis not present

## 2017-03-23 DIAGNOSIS — N2581 Secondary hyperparathyroidism of renal origin: Secondary | ICD-10-CM | POA: Diagnosis not present

## 2017-03-27 ENCOUNTER — Other Ambulatory Visit: Payer: Self-pay

## 2017-03-27 ENCOUNTER — Encounter (HOSPITAL_COMMUNITY): Payer: Self-pay | Admitting: Emergency Medicine

## 2017-03-27 ENCOUNTER — Inpatient Hospital Stay (HOSPITAL_COMMUNITY)
Admission: EM | Admit: 2017-03-27 | Discharge: 2017-03-30 | DRG: 252 | Disposition: A | Discharge status: Home / Self Care | Payer: Medicare Other | Attending: Internal Medicine | Admitting: Internal Medicine

## 2017-03-27 DIAGNOSIS — E875 Hyperkalemia: Secondary | ICD-10-CM | POA: Diagnosis present

## 2017-03-27 DIAGNOSIS — E118 Type 2 diabetes mellitus with unspecified complications: Secondary | ICD-10-CM

## 2017-03-27 DIAGNOSIS — Z992 Dependence on renal dialysis: Secondary | ICD-10-CM

## 2017-03-27 DIAGNOSIS — D631 Anemia in chronic kidney disease: Secondary | ICD-10-CM | POA: Diagnosis present

## 2017-03-27 DIAGNOSIS — Z8249 Family history of ischemic heart disease and other diseases of the circulatory system: Secondary | ICD-10-CM

## 2017-03-27 DIAGNOSIS — Z79899 Other long term (current) drug therapy: Secondary | ICD-10-CM

## 2017-03-27 DIAGNOSIS — T8249XA Other complication of vascular dialysis catheter, initial encounter: Secondary | ICD-10-CM | POA: Diagnosis not present

## 2017-03-27 DIAGNOSIS — Z9115 Patient's noncompliance with renal dialysis: Secondary | ICD-10-CM

## 2017-03-27 DIAGNOSIS — J9601 Acute respiratory failure with hypoxia: Secondary | ICD-10-CM | POA: Diagnosis present

## 2017-03-27 DIAGNOSIS — R7989 Other specified abnormal findings of blood chemistry: Secondary | ICD-10-CM

## 2017-03-27 DIAGNOSIS — Z833 Family history of diabetes mellitus: Secondary | ICD-10-CM

## 2017-03-27 DIAGNOSIS — E11621 Type 2 diabetes mellitus with foot ulcer: Secondary | ICD-10-CM | POA: Diagnosis present

## 2017-03-27 DIAGNOSIS — E8779 Other fluid overload: Secondary | ICD-10-CM

## 2017-03-27 DIAGNOSIS — R778 Other specified abnormalities of plasma proteins: Secondary | ICD-10-CM | POA: Diagnosis present

## 2017-03-27 DIAGNOSIS — R748 Abnormal levels of other serum enzymes: Secondary | ICD-10-CM | POA: Diagnosis present

## 2017-03-27 DIAGNOSIS — I871 Compression of vein: Secondary | ICD-10-CM | POA: Diagnosis not present

## 2017-03-27 DIAGNOSIS — Z9114 Patient's other noncompliance with medication regimen: Secondary | ICD-10-CM

## 2017-03-27 DIAGNOSIS — E872 Acidosis: Secondary | ICD-10-CM | POA: Diagnosis not present

## 2017-03-27 DIAGNOSIS — I132 Hypertensive heart and chronic kidney disease with heart failure and with stage 5 chronic kidney disease, or end stage renal disease: Secondary | ICD-10-CM | POA: Diagnosis not present

## 2017-03-27 DIAGNOSIS — M7989 Other specified soft tissue disorders: Secondary | ICD-10-CM | POA: Diagnosis present

## 2017-03-27 DIAGNOSIS — N186 End stage renal disease: Secondary | ICD-10-CM | POA: Diagnosis present

## 2017-03-27 DIAGNOSIS — I161 Hypertensive emergency: Secondary | ICD-10-CM | POA: Diagnosis present

## 2017-03-27 DIAGNOSIS — I10 Essential (primary) hypertension: Secondary | ICD-10-CM

## 2017-03-27 DIAGNOSIS — E1122 Type 2 diabetes mellitus with diabetic chronic kidney disease: Secondary | ICD-10-CM | POA: Diagnosis present

## 2017-03-27 DIAGNOSIS — H409 Unspecified glaucoma: Secondary | ICD-10-CM | POA: Diagnosis present

## 2017-03-27 DIAGNOSIS — E8770 Fluid overload, unspecified: Secondary | ICD-10-CM | POA: Diagnosis not present

## 2017-03-27 DIAGNOSIS — Z794 Long term (current) use of insulin: Secondary | ICD-10-CM

## 2017-03-27 DIAGNOSIS — D696 Thrombocytopenia, unspecified: Secondary | ICD-10-CM | POA: Diagnosis present

## 2017-03-27 DIAGNOSIS — I160 Hypertensive urgency: Secondary | ICD-10-CM | POA: Diagnosis not present

## 2017-03-27 DIAGNOSIS — L97509 Non-pressure chronic ulcer of other part of unspecified foot with unspecified severity: Secondary | ICD-10-CM

## 2017-03-27 DIAGNOSIS — I5033 Acute on chronic diastolic (congestive) heart failure: Secondary | ICD-10-CM | POA: Diagnosis not present

## 2017-03-27 MED ORDER — ALBUTEROL SULFATE (2.5 MG/3ML) 0.083% IN NEBU
5.0000 mg | INHALATION_SOLUTION | Freq: Once | RESPIRATORY_TRACT | Status: AC
  Administered 2017-03-28: 5 mg via RESPIRATORY_TRACT
  Filled 2017-03-27: qty 6

## 2017-03-27 NOTE — ED Triage Notes (Addendum)
Pt is a dialysis pt and has not had his dialysis in 4 days  Pt is c/o shortness of breath and swelling all over  Pt's left arm has a great amount of swelling and he is c/o pain in that arm  Pt's oxygen sat in the 80s on room air  Oxygen applied in triage at 2 liters/min via [place]  Pt states his vision has been blurred for the past couple of days and his blood pressure has been elevated

## 2017-03-27 NOTE — ED Provider Notes (Signed)
[facility] DEPT Provider Note: Georgena Spurling, MD, FACEP  CSN: [id number] MRN: [id number] ARRIVAL: 03/27/17 at 2332 ROOM: [place] [facility]   HISTORY OF PRESENT ILLNESS  03/27/17 11:59 PM Howard Bell is a 52 y.o. male with end-stage renal disease on hemodialysis.  He was unable to attend dialysis 2 days ago due to transportation problems.  He has not had dialysis in 4 days.  He is here with shortness of breath which began gradually today.  Symptoms are moderate and worse with exertion.  He denies chest pain.  He is having some edema in his face and a trace edema in his legs.  His oxygen saturation was noted to be in the low 80s at home on room air but came up with application of oxygen by nasal cannula.  He also notes that his blood pressure has been elevated and he had blurred vision earlier which has resolved.  He notes that his left upper extremity is significantly swollen and he has a decreased pulse in his left upper arm AV fistula.  He rates his pain in his left arm as an 8 out of 10.  Sensation and motor function are still intact in the left upper extremity.   Past Medical History:  Diagnosis Date  . Congestive heart failure (CHF) ([place])   . 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 ESRD (end stage renal disease) on dialysis ([place])   . Hypertension     Past Surgical History:  Procedure Laterality Date  . HERNIA REPAIR      Family History  Problem Relation Age of Onset  . Diabetes Mother   . Hypertension Father     Social History   Tobacco Use  . Smoking status: Never Smoker  . Smokeless tobacco: Never Used  Substance Use Topics  . Alcohol use: No  . Drug use: No    Prior to Admission medications   Medication Sig Start Date End Date Taking? Authorizing Provider  amLODipine (NORVASC) 10 MG tablet Take 10 mg by mouth daily.    Yes [provider]  brimonidine (ALPHAGAN) 0.2 % ophthalmic  solution Place 1 drop into both eyes 2 (two) times daily.   Yes [provider]  diclofenac sodium (VOLTAREN) 1 % GEL Apply 4 g topically 4 (four) times daily. Patient taking differently: Apply 4 g topically 4 (four) times daily as needed (pain).  11/27/16  Yes Debbe Odea, MD  insulin lispro (HUMALOG KWIKPEN) 100 UNIT/ML KiwkPen Inject 0.1 mLs (10 Units total) into the skin 3 (three) times daily. And pen needles 3/day. 02/23/17  Yes Renato Shin, MD  metoprolol succinate (TOPROL-XL) 100 MG 24 hr tablet Take 1 tablet (100 mg total) by mouth daily. Take with or immediately following a meal. 08/07/16  Yes Thurnell Lose, MD  timolol (BETIMOL) 0.5 % ophthalmic solution Place 1 drop into both eyes 2 (two) times daily.   Yes [provider]  travoprost, benzalkonium, (TRAVATAN) 0.004 % ophthalmic solution Place 1 drop into both eyes at bedtime.   Yes [provider]  aspirin EC 81 MG tablet Take 1 tablet (81 mg total) by mouth daily. Patient not taking: Reported on 03/28/2017 08/06/16   Thurnell Lose, MD    Allergies Patient has no known allergies.   REVIEW OF SYSTEMS  Negative except as noted here or in the History of Present Illness.   PHYSICAL EXAMINATION  Initial Vital Signs Blood pressure Marland Kitchen)  170/99, pulse (!) 111, temperature 97.9 F (36.6 C), temperature source Oral, resp. rate 20, weight 90.7 kg (200 lb), SpO2 93 %.  Examination General: Well-developed, well-nourished male in no acute distress; appearance consistent with age of record HENT: normocephalic; atraumatic; mild facial edema Eyes: pupils equal, round and reactive to light; extraocular muscles intact Neck: supple Heart: regular rate and rhythm; tachycardia Lungs: Rales in bases Abdomen: soft; nondistended; nontender; bowel sounds present Extremities: No deformity; full range of motion; pulses normal except left wrist +1; Trace edema of lower legs; significant, firm edema of entire left upper  extremity with decreased pulse in left AV fistula and absence of thrill, hand warm with brisk capillary refill, old AV fistula with palpable clot Neurologic: Awake, alert and oriented; motor function intact in all extremities and symmetric; no facial droop Skin: Warm and dry Psychiatric: Normal mood and affect   RESULTS  Summary of this visit's results, reviewed by myself:  EKG Interpretation:  Date & Time: 03/27/2017 11:57 PM  Rate: 108  Rhythm: sinus tachycardia  QRS Axis: normal  Intervals: QT prolonged  ST/T Wave abnormalities: normal  Conduction Disutrbances:left anterior fascicular block  Narrative Interpretation:   Old EKG Reviewed: No significant changes   Laboratory Studies: Results for orders placed or performed during the hospital encounter of 03/27/17 (from the past 24 hour(s))  Basic metabolic panel     Status: Abnormal   Collection Time: 03/28/17 12:46 AM  Result Value Ref Range   Sodium 139 135 - 145 mmol/L   Potassium 5.8 (H) 3.5 - 5.1 mmol/L   Chloride 101 101 - 111 mmol/L   CO2 18 (L) 22 - 32 mmol/L   Glucose, Bld 134 (H) 65 - 99 mg/dL   BUN 130 (H) 6 - 20 mg/dL   Creatinine, Ser 21.14 (H) 0.61 - 1.24 mg/dL   Calcium 9.2 8.9 - 10.3 mg/dL   GFR calc non Af Amer 2 (L) >60 mL/min   GFR calc Af Amer 3 (L) >60 mL/min   Anion gap 20 (H) 5 - 15  CBC     Status: Abnormal   Collection Time: 03/28/17 12:46 AM  Result Value Ref Range   WBC 7.5 4.0 - 10.5 K/uL   RBC 3.99 (L) 4.22 - 5.81 MIL/uL   Hemoglobin 12.5 (L) 13.0 - 17.0 g/dL   HCT 37.0 (L) 39.0 - 52.0 %   MCV 92.7 78.0 - 100.0 fL   MCH 31.3 26.0 - 34.0 pg   MCHC 33.8 30.0 - 36.0 g/dL   RDW 14.8 11.5 - 15.5 %   Platelets 165 150 - 400 K/uL  CBG monitoring, ED     Status: Abnormal   Collection Time: 03/28/17 12:53 AM  Result Value Ref Range   Glucose-Capillary 131 (H) 65 - 99 mg/dL   Comment 1 Notify RN   I-stat troponin, ED     Status: Abnormal   Collection Time: 03/28/17 12:57 AM  Result Value Ref  Range   Troponin i, poc 0.11 (HH) 0.00 - 0.08 ng/mL   Comment NOTIFIED PHYSICIAN    Comment 3           Imaging Studies: Dg Chest 2 View  Result Date: 03/28/2017 CLINICAL DATA:  52 y/o  M; shortness of breath. EXAM: CHEST  2 VIEW COMPARISON:  11/26/2016 chest radiograph FINDINGS: Stable cardiac silhouette given projection and technique. Reticular opacities of the lungs. No focal consolidation. No pleural effusion or pneumothorax identified. Bones are unremarkable. IMPRESSION: Reticular opacities of lungs  probably representing interstitial pulmonary edema. Electronically Signed   By: Kristine Garbe M.D.   On: 03/28/2017 01:22    ED COURSE  Nursing notes and initial vitals signs, including pulse oximetry, reviewed.  Vitals:   03/28/17 0022 03/28/17 0032 03/28/17 0051 03/28/17 0113  BP: (!) 182/102   (!) 180/98  Pulse: (!) 105   97  Resp: 20   (!) 23  Temp:      TempSrc:      SpO2: 95% 100%  93%  Weight:   90.7 kg (200 lb)   Height:   6' (1.829 m)    12:42 AM Patient given Lovenox for likely left upper extremity DVT or AV fistula thrombosis.  2:10 AM States he has been out of his blood pressure medication for several days.  We will give him some IV labetalol.  We will have him transferred to [facility] for dialysis. Dr. Myna Hidalgo of hospitalist service consulted.  3:48 AM Dr. Lorrene Reid of nephrology consulted.  She advises we administer Kayexalate for the hyperkalemia pending emergency hemodialysis later today.   PROCEDURES    ED DIAGNOSES     ICD-10-CM   1. End-stage renal disease needing dialysis ([employer]) N18.6    Z99.2   2. Other hypervolemia E87.79   3. Hyperkalemia E87.5   4. Hypertensive urgency I16.0   5. Clotted dialysis access, initial encounter [facility]) T82.49XA        Kayliah Tindol, Jenny Reichmann, MD 03/28/17 [phone]

## 2017-03-28 ENCOUNTER — Encounter (HOSPITAL_COMMUNITY): Payer: Self-pay | Admitting: Family Medicine

## 2017-03-28 ENCOUNTER — Emergency Department (HOSPITAL_COMMUNITY): Payer: Medicare Other

## 2017-03-28 ENCOUNTER — Observation Stay (HOSPITAL_COMMUNITY): Payer: Medicare Other

## 2017-03-28 DIAGNOSIS — Z794 Long term (current) use of insulin: Secondary | ICD-10-CM | POA: Diagnosis not present

## 2017-03-28 DIAGNOSIS — Z992 Dependence on renal dialysis: Secondary | ICD-10-CM | POA: Diagnosis present

## 2017-03-28 DIAGNOSIS — I161 Hypertensive emergency: Secondary | ICD-10-CM | POA: Diagnosis not present

## 2017-03-28 DIAGNOSIS — M7989 Other specified soft tissue disorders: Secondary | ICD-10-CM | POA: Diagnosis not present

## 2017-03-28 DIAGNOSIS — I160 Hypertensive urgency: Secondary | ICD-10-CM | POA: Insufficient documentation

## 2017-03-28 DIAGNOSIS — N186 End stage renal disease: Secondary | ICD-10-CM | POA: Diagnosis present

## 2017-03-28 DIAGNOSIS — Z9114 Patient's other noncompliance with medication regimen: Secondary | ICD-10-CM | POA: Diagnosis not present

## 2017-03-28 DIAGNOSIS — Z79899 Other long term (current) drug therapy: Secondary | ICD-10-CM | POA: Diagnosis not present

## 2017-03-28 DIAGNOSIS — E875 Hyperkalemia: Secondary | ICD-10-CM | POA: Diagnosis not present

## 2017-03-28 DIAGNOSIS — I120 Hypertensive chronic kidney disease with stage 5 chronic kidney disease or end stage renal disease: Secondary | ICD-10-CM | POA: Diagnosis not present

## 2017-03-28 DIAGNOSIS — E118 Type 2 diabetes mellitus with unspecified complications: Secondary | ICD-10-CM | POA: Diagnosis not present

## 2017-03-28 DIAGNOSIS — Z833 Family history of diabetes mellitus: Secondary | ICD-10-CM | POA: Diagnosis not present

## 2017-03-28 DIAGNOSIS — D631 Anemia in chronic kidney disease: Secondary | ICD-10-CM | POA: Diagnosis present

## 2017-03-28 DIAGNOSIS — T82858A Stenosis of vascular prosthetic devices, implants and grafts, initial encounter: Secondary | ICD-10-CM | POA: Diagnosis not present

## 2017-03-28 DIAGNOSIS — N19 Unspecified kidney failure: Secondary | ICD-10-CM | POA: Diagnosis not present

## 2017-03-28 DIAGNOSIS — E1122 Type 2 diabetes mellitus with diabetic chronic kidney disease: Secondary | ICD-10-CM | POA: Diagnosis not present

## 2017-03-28 DIAGNOSIS — H409 Unspecified glaucoma: Secondary | ICD-10-CM | POA: Diagnosis present

## 2017-03-28 DIAGNOSIS — I10 Essential (primary) hypertension: Secondary | ICD-10-CM | POA: Diagnosis not present

## 2017-03-28 DIAGNOSIS — R748 Abnormal levels of other serum enzymes: Secondary | ICD-10-CM | POA: Diagnosis not present

## 2017-03-28 DIAGNOSIS — I132 Hypertensive heart and chronic kidney disease with heart failure and with stage 5 chronic kidney disease, or end stage renal disease: Secondary | ICD-10-CM | POA: Diagnosis present

## 2017-03-28 DIAGNOSIS — D696 Thrombocytopenia, unspecified: Secondary | ICD-10-CM | POA: Diagnosis present

## 2017-03-28 DIAGNOSIS — T8249XA Other complication of vascular dialysis catheter, initial encounter: Secondary | ICD-10-CM | POA: Diagnosis not present

## 2017-03-28 DIAGNOSIS — I5033 Acute on chronic diastolic (congestive) heart failure: Secondary | ICD-10-CM | POA: Diagnosis present

## 2017-03-28 DIAGNOSIS — Z8249 Family history of ischemic heart disease and other diseases of the circulatory system: Secondary | ICD-10-CM | POA: Diagnosis not present

## 2017-03-28 DIAGNOSIS — E8779 Other fluid overload: Secondary | ICD-10-CM | POA: Diagnosis not present

## 2017-03-28 DIAGNOSIS — J9601 Acute respiratory failure with hypoxia: Secondary | ICD-10-CM | POA: Diagnosis not present

## 2017-03-28 DIAGNOSIS — R0602 Shortness of breath: Secondary | ICD-10-CM | POA: Diagnosis not present

## 2017-03-28 DIAGNOSIS — I871 Compression of vein: Secondary | ICD-10-CM | POA: Diagnosis present

## 2017-03-28 DIAGNOSIS — E872 Acidosis: Secondary | ICD-10-CM | POA: Diagnosis present

## 2017-03-28 DIAGNOSIS — Z9115 Patient's noncompliance with renal dialysis: Secondary | ICD-10-CM | POA: Diagnosis not present

## 2017-03-28 LAB — CBC
HCT: 37 % — ABNORMAL LOW (ref 39.0–52.0)
Hemoglobin: 12.5 g/dL — ABNORMAL LOW (ref 13.0–17.0)
MCH: 31.3 pg (ref 26.0–34.0)
MCHC: 33.8 g/dL (ref 30.0–36.0)
MCV: 92.7 fL (ref 78.0–100.0)
Platelets: 165 10*3/uL (ref 150–400)
RBC: 3.99 MIL/uL — ABNORMAL LOW (ref 4.22–5.81)
RDW: 14.8 % (ref 11.5–15.5)
WBC: 7.5 10*3/uL (ref 4.0–10.5)

## 2017-03-28 LAB — BASIC METABOLIC PANEL
Anion gap: 20 — ABNORMAL HIGH (ref 5–15)
Anion gap: 21 — ABNORMAL HIGH (ref 5–15)
Anion gap: 22 — ABNORMAL HIGH (ref 5–15)
BUN: 130 mg/dL — ABNORMAL HIGH (ref 6–20)
BUN: 130 mg/dL — ABNORMAL HIGH (ref 6–20)
BUN: 131 mg/dL — ABNORMAL HIGH (ref 6–20)
CO2: 18 mmol/L — ABNORMAL LOW (ref 22–32)
CO2: 17 mmol/L — ABNORMAL LOW (ref 22–32)
CO2: 18 mmol/L — ABNORMAL LOW (ref 22–32)
Calcium: 9.2 mg/dL (ref 8.9–10.3)
Calcium: 9.3 mg/dL (ref 8.9–10.3)
Calcium: 9.1 mg/dL (ref 8.9–10.3)
Chloride: 101 mmol/L (ref 101–111)
Chloride: 102 mmol/L (ref 101–111)
Chloride: 101 mmol/L (ref 101–111)
Creatinine, Ser: 21.14 mg/dL — ABNORMAL HIGH (ref 0.61–1.24)
Creatinine, Ser: 21.33 mg/dL — ABNORMAL HIGH (ref 0.61–1.24)
Creatinine, Ser: 21.58 mg/dL — ABNORMAL HIGH (ref 0.61–1.24)
GFR calc Af Amer: 3 mL/min — ABNORMAL LOW (ref >60)
GFR calc Af Amer: 2 mL/min — ABNORMAL LOW (ref >60)
GFR calc Af Amer: 2 mL/min — ABNORMAL LOW (ref >60)
GFR calc non Af Amer: 2 mL/min — ABNORMAL LOW (ref >60)
GFR calc non Af Amer: 2 mL/min — ABNORMAL LOW (ref >60)
GFR calc non Af Amer: 2 mL/min — ABNORMAL LOW (ref >60)
Glucose, Bld: 134 mg/dL — ABNORMAL HIGH (ref 65–99)
Glucose, Bld: 130 mg/dL — ABNORMAL HIGH (ref 65–99)
Glucose, Bld: 113 mg/dL — ABNORMAL HIGH (ref 65–99)
Potassium: 5.8 mmol/L — ABNORMAL HIGH (ref 3.5–5.1)
Potassium: 5.5 mmol/L — ABNORMAL HIGH (ref 3.5–5.1)
Potassium: 5.8 mmol/L — ABNORMAL HIGH (ref 3.5–5.1)
Sodium: 139 mmol/L (ref 135–145)
Sodium: 140 mmol/L (ref 135–145)
Sodium: 141 mmol/L (ref 135–145)

## 2017-03-28 LAB — GLUCOSE, CAPILLARY
Glucose-Capillary: 170 mg/dL — ABNORMAL HIGH (ref 65–99)
Glucose-Capillary: 202 mg/dL — ABNORMAL HIGH (ref 65–99)

## 2017-03-28 LAB — I-STAT TROPONIN, ED: Troponin i, poc: 0.11 ng/mL (ref 0.00–0.08)

## 2017-03-28 LAB — CBG MONITORING, ED
Glucose-Capillary: 120 mg/dL — ABNORMAL HIGH (ref 65–99)
Glucose-Capillary: 131 mg/dL — ABNORMAL HIGH (ref 65–99)

## 2017-03-28 LAB — TROPONIN I
Troponin I: 0.12 ng/mL (ref <0.03)
Troponin I: 0.12 ng/mL (ref <0.03)

## 2017-03-28 MED ORDER — ALBUTEROL SULFATE (2.5 MG/3ML) 0.083% IN NEBU: 2.5000 mg | INHALATION_SOLUTION | RESPIRATORY_TRACT | Status: DC | PRN

## 2017-03-28 MED ORDER — ACETAMINOPHEN 650 MG RE SUPP: 650.0000 mg | Freq: Four times a day (QID) | RECTAL | Status: DC | PRN

## 2017-03-28 MED ORDER — LATANOPROST 0.005 % OP SOLN
1.0000 [drp] | Freq: Every day | OPHTHALMIC | Status: DC
  Administered 2017-03-28 – 2017-03-29 (×2): 1 [drp] via OPHTHALMIC
  Filled 2017-03-28 (×2): qty 2.5

## 2017-03-28 MED ORDER — SODIUM CHLORIDE 0.9 % IV SOLN: 100.0000 mL | INTRAVENOUS | Status: DC | PRN

## 2017-03-28 MED ORDER — PENTAFLUOROPROP-TETRAFLUOROETH EX AERO
1.0000 "application " | INHALATION_SPRAY | CUTANEOUS | Status: DC | PRN
  Filled 2017-03-28: qty 30

## 2017-03-28 MED ORDER — BRIMONIDINE TARTRATE 0.2 % OP SOLN
1.0000 [drp] | Freq: Two times a day (BID) | OPHTHALMIC | Status: DC
  Administered 2017-03-28 – 2017-03-29 (×3): 1 [drp] via OPHTHALMIC
  Filled 2017-03-28 (×2): qty 5

## 2017-03-28 MED ORDER — LIDOCAINE-PRILOCAINE 2.5-2.5 % EX CREA
1.0000 "application " | TOPICAL_CREAM | CUTANEOUS | Status: DC | PRN
  Filled 2017-03-28: qty 5

## 2017-03-28 MED ORDER — LABETALOL HCL 5 MG/ML IV SOLN
20.0000 mg | Freq: Once | INTRAVENOUS | Status: AC
  Administered 2017-03-28: 20 mg via INTRAVENOUS
  Filled 2017-03-28: qty 4

## 2017-03-28 MED ORDER — ONDANSETRON HCL 4 MG/2ML IJ SOLN: 4.0000 mg | Freq: Four times a day (QID) | INTRAMUSCULAR | Status: DC | PRN

## 2017-03-28 MED ORDER — SODIUM POLYSTYRENE SULFONATE 15 GM/60ML PO SUSP: 30.0000 g | Freq: Once | ORAL | Status: DC

## 2017-03-28 MED ORDER — HEPARIN SODIUM (PORCINE) 1000 UNIT/ML DIALYSIS
9000.0000 [IU] | Freq: Once | INTRAMUSCULAR | Status: DC
  Filled 2017-03-28: qty 9

## 2017-03-28 MED ORDER — ALTEPLASE 2 MG IJ SOLR: 2.0000 mg | Freq: Once | INTRAMUSCULAR | Status: DC | PRN

## 2017-03-28 MED ORDER — HEPARIN SODIUM (PORCINE) 1000 UNIT/ML DIALYSIS: 1000.0000 [IU] | INTRAMUSCULAR | Status: DC | PRN

## 2017-03-28 MED ORDER — SODIUM CHLORIDE 0.9% FLUSH: 3.0000 mL | INTRAVENOUS | Status: DC | PRN

## 2017-03-28 MED ORDER — LIDOCAINE HCL (PF) 1 % IJ SOLN: 5.0000 mL | INTRAMUSCULAR | Status: DC | PRN

## 2017-03-28 MED ORDER — METOPROLOL SUCCINATE ER 100 MG PO TB24
100.0000 mg | ORAL_TABLET | Freq: Every day | ORAL | Status: DC
  Administered 2017-03-29: 100 mg via ORAL
  Filled 2017-03-28 (×3): qty 1

## 2017-03-28 MED ORDER — SODIUM POLYSTYRENE SULFONATE 15 GM/60ML PO SUSP
30.0000 g | Freq: Once | ORAL | Status: AC
  Administered 2017-03-28: 30 g via ORAL
  Filled 2017-03-28: qty 120

## 2017-03-28 MED ORDER — BISACODYL 5 MG PO TBEC: 5.0000 mg | DELAYED_RELEASE_TABLET | Freq: Every day | ORAL | Status: DC | PRN

## 2017-03-28 MED ORDER — ACETAMINOPHEN 325 MG PO TABS: 650.0000 mg | ORAL_TABLET | Freq: Four times a day (QID) | ORAL | Status: DC | PRN

## 2017-03-28 MED ORDER — ONDANSETRON HCL 4 MG PO TABS: 4.0000 mg | ORAL_TABLET | Freq: Four times a day (QID) | ORAL | Status: DC | PRN

## 2017-03-28 MED ORDER — HYDROCODONE-ACETAMINOPHEN 5-325 MG PO TABS
1.0000 | ORAL_TABLET | ORAL | Status: DC | PRN
  Administered 2017-03-28: 2 via ORAL
  Filled 2017-03-28: qty 2

## 2017-03-28 MED ORDER — INSULIN ASPART 100 UNIT/ML ~~LOC~~ SOLN
0.0000 [IU] | Freq: Every day | SUBCUTANEOUS | Status: DC
Start: 2017-03-28 — End: 2017-03-30
  Administered 2017-03-28: 2 [IU] via SUBCUTANEOUS

## 2017-03-28 MED ORDER — WHITE PETROLATUM EX OINT
TOPICAL_OINTMENT | CUTANEOUS | Status: AC
  Administered 2017-03-28: 18:00:00
  Filled 2017-03-28: qty 28.35

## 2017-03-28 MED ORDER — SODIUM CHLORIDE 0.9% FLUSH
3.0000 mL | Freq: Two times a day (BID) | INTRAVENOUS | Status: DC
  Administered 2017-03-28 – 2017-03-29 (×5): 3 mL via INTRAVENOUS

## 2017-03-28 MED ORDER — ENOXAPARIN SODIUM 100 MG/ML ~~LOC~~ SOLN
1.0000 mg/kg | Freq: Once | SUBCUTANEOUS | Status: AC
  Administered 2017-03-28: 90 mg via SUBCUTANEOUS
  Filled 2017-03-28: qty 0.9

## 2017-03-28 MED ORDER — HEPARIN SODIUM (PORCINE) 5000 UNIT/ML IJ SOLN
5000.0000 [IU] | Freq: Three times a day (TID) | INTRAMUSCULAR | Status: DC
  Filled 2017-03-28: qty 1

## 2017-03-28 MED ORDER — HYDRALAZINE HCL 20 MG/ML IJ SOLN: 10.0000 mg | INTRAMUSCULAR | Status: DC | PRN

## 2017-03-28 MED ORDER — SENNOSIDES-DOCUSATE SODIUM 8.6-50 MG PO TABS: 1.0000 | ORAL_TABLET | Freq: Every evening | ORAL | Status: DC | PRN

## 2017-03-28 MED ORDER — SODIUM CHLORIDE 0.9 % IV SOLN: 250.0000 mL | INTRAVENOUS | Status: DC | PRN

## 2017-03-28 MED ORDER — INSULIN ASPART 100 UNIT/ML ~~LOC~~ SOLN
0.0000 [IU] | Freq: Three times a day (TID) | SUBCUTANEOUS | Status: DC
Start: 2017-03-28 — End: 2017-03-30
  Administered 2017-03-28: 2 [IU] via SUBCUTANEOUS
  Administered 2017-03-29 (×2): 1 [IU] via SUBCUTANEOUS
  Administered 2017-03-29: 3 [IU] via SUBCUTANEOUS
  Administered 2017-03-30: 2 [IU] via SUBCUTANEOUS

## 2017-03-28 MED ORDER — AMLODIPINE BESYLATE 10 MG PO TABS
10.0000 mg | ORAL_TABLET | Freq: Every day | ORAL | Status: DC
  Administered 2017-03-28 – 2017-03-30 (×3): 10 mg via ORAL
  Filled 2017-03-28 (×2): qty 1
  Filled 2017-03-28: qty 2

## 2017-03-28 MED ORDER — ENOXAPARIN SODIUM 100 MG/ML ~~LOC~~ SOLN
90.0000 mg | Freq: Every day | SUBCUTANEOUS | Status: DC
  Administered 2017-03-28: 90 mg via SUBCUTANEOUS
  Filled 2017-03-28: qty 1
  Filled 2017-03-28: qty 0.9

## 2017-03-28 MED ORDER — TIMOLOL MALEATE 0.5 % OP SOLN
1.0000 [drp] | Freq: Two times a day (BID) | OPHTHALMIC | Status: DC
  Administered 2017-03-28 – 2017-03-29 (×3): 1 [drp] via OPHTHALMIC
  Filled 2017-03-28 (×2): qty 5

## 2017-03-28 NOTE — Progress Notes (Signed)
ANTICOAGULATION CONSULT NOTE - Initial Consult  Pharmacy Consult for enoxaparin Indication: VTE Treatment  No Known Allergies  Patient Measurements: Height: 6' (182.9 cm) Weight: 200 lb (90.7 kg) IBW/kg (Calculated) : 77.6 Heparin Dosing Weight:   Vital Signs: Temp: 97.9 F (36.6 C) (01/06 2340) Temp Source: Oral (01/06 2340) BP: 162/99 (01/07 0330) Pulse Rate: 95 (01/07 0330)  Labs: Recent Labs    03/28/17 0046  HGB 12.5*  HCT 37.0*  PLT 165  CREATININE 21.14*    Estimated Creatinine Clearance: 4.6 mL/min (A) (by C-G formula based on SCr of 21.14 mg/dL (H)).   Medical History: Past Medical History:  Diagnosis Date  . Congestive heart failure (CHF) ([place])   . 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 ESRD (end stage renal disease) on dialysis ([place])   . Hypertension     Medications:   (Not in a hospital admission) Scheduled:  . amLODipine  10 mg Oral Daily  . brimonidine  1 drop Both Eyes BID  . enoxaparin (LOVENOX) injection  90 mg Subcutaneous QHS  . insulin aspart  0-5 Units Subcutaneous QHS  . insulin aspart  0-9 Units Subcutaneous TID WC  . latanoprost  1 drop Both Eyes QHS  . metoprolol succinate  100 mg Oral Daily  . sodium chloride flush  3 mL Intravenous Q12H  . sodium chloride flush  3 mL Intravenous Q12H  . sodium polystyrene  30 g Oral Once  . timolol  1 drop Both Eyes BID    Assessment: Patient with suspected DVT.  Enoxaparin ordered per pharmacy.  First dose of antibiotics already given in ED.  Patient with ESRD on HD.    Goal of Therapy:  Anti-Xa level 0.6-1 units/ml 4hrs after LMWH dose given Monitor platelets by anticoagulation protocol: Yes   Plan:  Enoxaparin 90mg  sq q24hr  Tyler Deis, Shea Stakes Crowford 03/28/2017,4:05 AM

## 2017-03-28 NOTE — ED Notes (Signed)
Patient went for chest xray.

## 2017-03-28 NOTE — H&P (Signed)
History and Physical    Howard Bell PYP:[id number] DOB: 1966-02-15 DOA: 03/27/2017  PCP: Renato Shin, MD   Patient coming from: Home  Chief Complaint: SOB, missed HD   HPI: Howard Bell is a 52 y.o. male with medical history significant for end-stage renal disease, insulin-dependent diabetes mellitus, and hypertension, now presenting to the emergency department for evaluation of shortness of breath.  Patient reports that he missed his dialysis session on due to transportation issues and was last dialyzed on 03/23/2017.  He has developed increasing dyspnea over the past couple days but denies chest pain, fevers, chills, or headaches.  Reports that he has been out of his blood pressure medications and insulin. Left arm began aching yesterday and he noted it to be swollen. No trauma to the arm, no fever, no wound or drainage.   ED Course: Upon arrival to the ED, patient is found to be afebrile, saturating low 80s on room air, slightly tachycardic, and hypertensive to 170/99.  EKG features a sinus tachycardia with rate 108 and LAFB.  Chest x-ray is notable for interstitial pulmonary edema.  Chemistry panel reveals a potassium of 5.8, bicarbonate 18, BUN 130, and anion gap of 20.  CBC is notable for mild normocytic anemia.  Troponin is elevated to 0.11.  Patient was treated with albuterol and labetalol in the ED.  Nephrology was consulted by the ED physician.  Patient will be admitted to the stepdown [facility] at [facility] for ongoing evaluation and management anemia, uremia, hypertensive emergency, pulmonary edema with hypoxia, and metabolic acidosis, all suspected secondary to end-stage renal disease with missed dialysis.  Review of Systems:  All other systems reviewed and apart from HPI, are negative.  Past Medical History:  Diagnosis Date  . Congestive heart failure (CHF) ([place])   . 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 ESRD (end stage renal disease)  on dialysis ([place])   . Hypertension     Past Surgical History:  Procedure Laterality Date  . HERNIA REPAIR       reports that  has never smoked. he has never used smokeless tobacco. He reports that he does not drink alcohol or use drugs.  No Known Allergies  Family History  Problem Relation Age of Onset  . Diabetes Mother   . Hypertension Father      Prior to Admission medications   Medication Sig Start Date End Date Taking? Authorizing Provider  amLODipine (NORVASC) 10 MG tablet Take 10 mg by mouth daily.    Yes [provider]  brimonidine (ALPHAGAN) 0.2 % ophthalmic solution Place 1 drop into both eyes 2 (two) times daily.   Yes [provider]  diclofenac sodium (VOLTAREN) 1 % GEL Apply 4 g topically 4 (four) times daily. Patient taking differently: Apply 4 g topically 4 (four) times daily as needed (pain).  11/27/16  Yes Debbe Odea, MD  insulin lispro (HUMALOG KWIKPEN) 100 UNIT/ML KiwkPen Inject 0.1 mLs (10 Units total) into the skin 3 (three) times daily. And pen needles 3/day. 02/23/17  Yes Renato Shin, MD  metoprolol succinate (TOPROL-XL) 100 MG 24 hr tablet Take 1 tablet (100 mg total) by mouth daily. Take with or immediately following a meal. 08/07/16  Yes Thurnell Lose, MD  timolol (BETIMOL) 0.5 % ophthalmic solution Place 1 drop into both eyes 2 (two) times daily.   Yes [provider]  travoprost, benzalkonium, (TRAVATAN) 0.004 % ophthalmic solution Place 1 drop into both eyes at bedtime.  Yes [provider]  aspirin EC 81 MG tablet Take 1 tablet (81 mg total) by mouth daily. Patient not taking: Reported on 03/28/2017 08/06/16   Thurnell Lose, MD    Physical Exam: Vitals:   03/28/17 0051 03/28/17 0113 03/28/17 0130 03/28/17 0200  BP:  (!) 180/98 (!) 182/93 (!) 180/101  Pulse:  97 (!) 106 (!) 106  Resp:  (!) 23 20 (!) 22  Temp:      TempSrc:      SpO2:  93% 96% 91%  Weight: 90.7 kg (200 lb)     Height: 6' (1.829 m)           Constitutional: NAD, calm, in apparent discomfort Eyes: PERTLA, lids and conjunctivae normal ENMT: Mucous membranes are moist. Posterior pharynx clear of any exudate or lesions.   Neck: normal, supple, no masses, no thyromegaly Respiratory: Increased WOB. Fine rales bilaterally. No accessory muscle use.  Cardiovascular: S1 & S2 heard, regular rate and rhythm. No significant JVD. Abdomen: No distension, no tenderness, no masses palpated. Bowel sounds normal.  Musculoskeletal: Left arm swollen and tender. No joint deformity upper and lower extremities.    Skin: no significant rashes, lesions, ulcers. Warm, dry, well-perfused. Neurologic: CN 2-12 grossly intact. Sensation intact. Strength 5/5 in all 4 limbs.  Psychiatric: Alert and oriented x 3. Calm, cooperative.     Labs on Admission: I have personally reviewed following labs and imaging studies  CBC: Recent Labs  Lab 03/28/17 0046  WBC 7.5  HGB 12.5*  HCT 37.0*  MCV 92.7  PLT 258   Basic Metabolic Panel: Recent Labs  Lab 03/28/17 0046  NA 139  K 5.8*  CL 101  CO2 18*  GLUCOSE 134*  BUN 130*  CREATININE 21.14*  CALCIUM 9.2   GFR: Estimated Creatinine Clearance: 4.6 mL/min (A) (by C-G formula based on SCr of 21.14 mg/dL (H)). Liver Function Tests: No results for input(s): AST, ALT, ALKPHOS, BILITOT, PROT, ALBUMIN in the last 168 hours. No results for input(s): LIPASE, AMYLASE in the last 168 hours. No results for input(s): AMMONIA in the last 168 hours. Coagulation Profile: No results for input(s): INR, PROTIME in the last 168 hours. Cardiac Enzymes: No results for input(s): CKTOTAL, CKMB, CKMBINDEX, TROPONINI in the last 168 hours. BNP (last 3 results) No results for input(s): PROBNP in the last 8760 hours. HbA1C: No results for input(s): HGBA1C in the last 72 hours. CBG: Recent Labs  Lab 03/28/17 0053  GLUCAP 131*   Lipid Profile: No results for input(s): CHOL, HDL, LDLCALC, TRIG, CHOLHDL,  LDLDIRECT in the last 72 hours. Thyroid Function Tests: No results for input(s): TSH, T4TOTAL, FREET4, T3FREE, THYROIDAB in the last 72 hours. Anemia Panel: No results for input(s): VITAMINB12, FOLATE, FERRITIN, TIBC, IRON, RETICCTPCT in the last 72 hours. Urine analysis:    Component Value Date/Time   COLORURINE RED (A) 11/19/2016 0020   APPEARANCEUR TURBID (A) 11/19/2016 0020   LABSPEC  11/19/2016 0020    TEST NOT REPORTED DUE TO COLOR INTERFERENCE OF URINE PIGMENT   PHURINE  11/19/2016 0020    TEST NOT REPORTED DUE TO COLOR INTERFERENCE OF URINE PIGMENT   GLUCOSEU (A) 11/19/2016 0020    TEST NOT REPORTED DUE TO COLOR INTERFERENCE OF URINE PIGMENT   HGBUR (A) 11/19/2016 0020    TEST NOT REPORTED DUE TO COLOR INTERFERENCE OF URINE PIGMENT   BILIRUBINUR (A) 11/19/2016 0020    TEST NOT REPORTED DUE TO COLOR INTERFERENCE OF URINE PIGMENT   KETONESUR (  A) 11/19/2016 0020    TEST NOT REPORTED DUE TO COLOR INTERFERENCE OF URINE PIGMENT   PROTEINUR (A) 11/19/2016 0020    TEST NOT REPORTED DUE TO COLOR INTERFERENCE OF URINE PIGMENT   NITRITE (A) 11/19/2016 0020    TEST NOT REPORTED DUE TO COLOR INTERFERENCE OF URINE PIGMENT   LEUKOCYTESUR (A) 11/19/2016 0020    TEST NOT REPORTED DUE TO COLOR INTERFERENCE OF URINE PIGMENT   Sepsis Labs: @LABRCNTIP (procalcitonin:4,lacticidven:4) )No results found for this or any previous visit (from the past 240 hour(s)).   Radiological Exams on Admission: Dg Chest 2 View  Result Date: 03/28/2017 CLINICAL DATA:  52 y/o  M; shortness of breath. EXAM: CHEST  2 VIEW COMPARISON:  11/26/2016 chest radiograph FINDINGS: Stable cardiac silhouette given projection and technique. Reticular opacities of the lungs. No focal consolidation. No pleural effusion or pneumothorax identified. Bones are unremarkable. IMPRESSION: Reticular opacities of lungs probably representing interstitial pulmonary edema. Electronically Signed   By: Kristine Garbe M.D.   On:  03/28/2017 01:22    EKG: Independently reviewed. Sinus tachycardia (rate 108), LAFB.   Assessment/Plan  1. ESRD; hypertensive urgency; metabolic acidosis; uremia; hyperkalemia; pulmonary edema with acute hypoxic respiratory failure   - Presents with SOB after missing HD on 1/4 d/t transportation issues, last dialyzed on 1/2  - Found to be hypoxic in low 80's on rm air with pulmonary edema on CXR  - Potassium mildly elevated without EKG changes  - Mild metabolic acidosis with elevated AG noted  - Nephrology has been consulted by ED physician and pt will be admitted to [facility] for HD  - Continue cardiac monitoring, supplemental O2, SLIV, fluid-restrict, repeat chemistries in a few hours    2. Insulin-dependent DM  - A1c was 8.5% in November 2018  - Managed at home with Humalog 10 units TID, though pt states has not taken in weeks  - Follow CBG's and start a SSI with Novolog    3. Hypertension with hypertensive urgency  - BP elevated in ED, likely secondary to hypervolemia and non-adherence with BP medicines  - Treated with labetalol IVP in ED  - Resume Norvasc and Toprol  - Nephrology consulted for HD   4. Elevated troponin  - No anginal complaints  - Will continue cardiac monitoring, continue beta-blocker, trend troponin   5. Left arm pain and swelling  - No fluctuance, no palpable cord  - DVT suspected and Lovenox started with venous US pending    DVT prophylaxis: sq heparin  Code Status: Full  Family Communication: Discussed with patient Disposition Plan: Observe in SDU at [facility]  Consults called: Nephrology Admission status: Observation     Vianne Bulls, MD [facility] Pager [phone]  If 7PM-7AM, please contact night-coverage [email or web address] Password [email or web address]  03/28/2017, 2:25 AM

## 2017-03-28 NOTE — ED Notes (Signed)
[facility] here to transport patient. Report provided for [facility].

## 2017-03-28 NOTE — Procedures (Signed)
   I was present at this dialysis session, have reviewed the session itself and made  appropriate changes Kelly Splinter MD [place] pager [phone]   03/28/2017, 12:14 PM

## 2017-03-28 NOTE — Consult Note (Signed)
Renal Service Consult Note [facility]  Howard Bell 03/28/2017 [name] Requesting Physician:  Dr Riley Kill  Reason for Consult:  SOB HPI: The patient is a 52 y.o. year-old with hx of DM2, HTN, and CHF, also ESRD on HD MWF missed last Friday HD and came to ED with SOB.  CXR shows early pulm edema.  Asked to see for dialysis.    Patient says has been on HD for 8 yrs, no hx transplant.  Using same access L arm he started with per pt.  Has had the graft worked on at [facility], but not recently.  He does have some soreness to the L upper arm and some swelling.  Denies any CP, prod cough, fevers, chills , sweats, no abd pain, no n/v/d.     ROS  denies CP  no joint pain   no HA  no blurry vision  no rash  no diarrhea  no nausea/ vomiting   Past Medical History  Past Medical History:  Diagnosis Date  . Congestive heart failure (CHF) ([place])   . 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 ESRD (end stage renal disease) on dialysis ([place])   . Hypertension    Past Surgical History  Past Surgical History:  Procedure Laterality Date  . HERNIA REPAIR     Family History  Family History  Problem Relation Age of Onset  . Diabetes Mother   . Hypertension Father    Social History  reports that  has never smoked. he has never used smokeless tobacco. He reports that he does not drink alcohol or use drugs. Allergies No Known Allergies Home medications Prior to Admission medications   Medication Sig Start Date End Date Taking? Authorizing Provider  amLODipine (NORVASC) 10 MG tablet Take 10 mg by mouth daily.    Yes [provider]  brimonidine (ALPHAGAN) 0.2 % ophthalmic solution Place 1 drop into both eyes 2 (two) times daily.   Yes [provider]  diclofenac sodium (VOLTAREN) 1 % GEL Apply 4 g topically 4 (four) times daily. Patient taking differently: Apply 4 g topically 4 (four) times daily as needed (pain).  11/27/16   Yes Debbe Odea, MD  insulin lispro (HUMALOG KWIKPEN) 100 UNIT/ML KiwkPen Inject 0.1 mLs (10 Units total) into the skin 3 (three) times daily. And pen needles 3/day. 02/23/17  Yes Renato Shin, MD  metoprolol succinate (TOPROL-XL) 100 MG 24 hr tablet Take 1 tablet (100 mg total) by mouth daily. Take with or immediately following a meal. 08/07/16  Yes Thurnell Lose, MD  timolol (BETIMOL) 0.5 % ophthalmic solution Place 1 drop into both eyes 2 (two) times daily.   Yes [provider]  travoprost, benzalkonium, (TRAVATAN) 0.004 % ophthalmic solution Place 1 drop into both eyes at bedtime.   Yes [provider]  aspirin EC 81 MG tablet Take 1 tablet (81 mg total) by mouth daily. Patient not taking: Reported on 03/28/2017 08/06/16   Thurnell Lose, MD   Liver Function Tests No results for input(s): AST, ALT, ALKPHOS, BILITOT, PROT, ALBUMIN in the last 168 hours. No results for input(s): LIPASE, AMYLASE in the last 168 hours. CBC Recent Labs  Lab 03/28/17 0046  WBC 7.5  HGB 12.5*  HCT 37.0*  MCV 92.7  PLT 748   Basic Metabolic Panel Recent Labs  Lab 03/28/17 0046 03/28/17 0344  NA 139 140  K 5.8* 5.5*  CL 101 102  CO2 18*  17*  GLUCOSE 134* 130*  BUN 130* 130*  CREATININE 21.14* 21.33*  CALCIUM 9.2 9.3   Iron/TIBC/Ferritin/ %Sat No results found for: IRON, TIBC, FERRITIN, IRONPCTSAT  Vitals:   03/28/17 0630 03/28/17 0645 03/28/17 0700 03/28/17 0846  BP: (!) 154/90 (!) 197/104 (!) 172/104 (!) 167/88  Pulse: 96 (!) 108 (!) 107 98  Resp: 16 15 17 16   Temp:    97.9 F (36.6 C)  TempSrc:    Oral  SpO2: 96% 96% 93% 94%  Weight:      Height:       Exam Gen alert, not in distress, on nasal O2 No rash, cyanosis or gangrene Sclera anicteric, throat clear  +JVD Chest bibasilar crackles, no wheezing RRR no MRG Abd soft ntnd no mass or ascites +bs GU normal male Ext no LE edema / no wounds or ulcers Neuro is alert, Ox 3 , nf LUA AVG +bruit, moderate LUA  swelling, non pitting  CXR - +early CHF  Dialysis: MWF  4h 7min   99.5kg   Heparin 9000 AVG LUA -hect 1  -sensipar 90 tiw at hd   Home meds: -norvasc 10/ toprol xl 100 qd -humalog insulin tid -eye drops -ecasa 81  Impression: 1. SOB / pulm edema - will need HD today.  AVG is not clotted on exam.  May need fistulogram for new swelling in LUA though, this is not emergent.   2. Mild hyperkalemia 3. ESRD on HD MWF 4. HTN - cont meds 5. DM - per primary    Plan - as above  Kelly Splinter MD [facility] pager [phone]   03/28/2017, 9:44 AM

## 2017-03-28 NOTE — ED Notes (Signed)
Ultrasound at bedside

## 2017-03-28 NOTE — Progress Notes (Signed)
PROGRESS NOTE    Howard Bell  IPJ:[id number] DOB: [birth year] DOA: 03/27/2017 PCP: Renato Shin, MD   Brief Narrative:  Howard Bell is a 52 y.o. male with medical history significant for end-stage renal disease, insulin-dependent diabetes mellitus, and hypertension, now presenting to the emergency department for evaluation of shortness of breath.  Patient reports that he missed his dialysis session on due to transportation issues and was last dialyzed on 03/23/2017.  He has developed increasing dyspnea over the past couple days but denies chest pain, fevers, chills, or headaches.  Reports that he has been out of his blood pressure medications and insulin. Left arm began aching yesterday and he noted it to be swollen. No trauma to the arm, no fever, no wound or drainage.   Assessment & Plan:   Principal Problem:   ESRD needing dialysis ([place]) Active Problems:   Acute respiratory failure with hypoxemia (HCC)   Diabetes mellitus with complication (HCC)   Hypertensive emergency   Elevated troponin   Essential hypertension   Hyperkalemia   Left arm swelling   1. ESRD; hypertensive urgency; metabolic acidosis; uremia; hyperkalemia; pulmonary edema with acute hypoxic respiratory failure   - Presents with SOB after missing HD on 1/4 d/t transportation issues, last dialyzed on 1/2  - Found to be hypoxic in low 80's on rm air with pulmonary edema on CXR  - Potassium mildly elevated without EKG changes  - Mild metabolic acidosis with elevated AG noted  - Nephrology has been consulted by ED physician and pt will [facility]  - Continue cardiac monitoring, supplemental O2, SLIV, fluid-restrict, repeat chemistries in a few hours    2. Insulin-dependent DM  - A1c was 8.5% in November 2018  - Managed at home with Humalog 10 units TID, though pt states has not taken in weeks  - Follow CBG's and start a SSI with Novolog    3. Hypertension with hypertensive urgency  - BP  elevated in ED, likely secondary to hypervolemia and non-adherence with BP medicines  - Treated with labetalol IVP in ED, prn hydral orderd - Resume Norvasc and Toprol  - Nephrology consulted for HD   4. Elevated troponin  - No anginal complaints  - Will continue cardiac monitoring, continue beta-blocker, trend troponin   5. Left arm pain and swelling  - No fluctuance, no palpable cord, fistula with thrill (per nephrology, may need fistulogram for new swelling) - DVT suspected and Lovenox started with venous US pending   DVT prophylaxis: lovenox for presumed VTE until pt has [place] Code Status: full  Family Communication: none at bedside Disposition Plan: pending   Consultants:   nephrology  Procedures:   None, pending [place] and dialysis  Antimicrobials:   none    Subjective: Wasn't feeling well after he'd missed dialysis.  Noticed swelling in L arm and SOB as well.   Objective: Vitals:   03/28/17 1030 03/28/17 1207 03/28/17 1209 03/28/17 1230  BP: (!) 184/87 (!) 165/89 (!) 149/69 (!) 161/91  Pulse: 92 89 90 90  Resp: 18 13    Temp:  (!) 97.3 F (36.3 C)    TempSrc:  Oral    SpO2: 94% 97%    Weight:  105.7 kg (233 lb 0.4 oz)    Height:        Intake/Output Summary (Last 24 hours) at 03/28/2017 1322 Last data filed at 03/28/2017 0252 Gross per 24 hour  Intake 6 ml  Output -  Net 6 ml  Filed Weights   03/27/17 2340 03/28/17 0051 03/28/17 1207  Weight: 90.7 kg (200 lb) 90.7 kg (200 lb) 105.7 kg (233 lb 0.4 oz)    Examination:  General exam: Appears calm and comfortable  Respiratory system: Crackles at bases, good air movement, no increased wob Cardiovascular system: S1 & S2 heard, RRR. No JVD, murmurs, rubs, gallops or clicks. No pedal edema. Gastrointestinal system: Abdomen is nondistended, soft and nontender. No organomegaly or masses felt. Normal bowel sounds heard. Central nervous system: Alert and oriented. No focal neurological deficits. Extremities:  LUE with swelling, palpable thrill at fistula Skin: No rashes, lesions or ulcers Psychiatry: Judgement and insight appear normal. Mood & affect appropriate.     Data Reviewed: I have personally reviewed following labs and imaging studies  CBC: Recent Labs  Lab 03/28/17 0046  WBC 7.5  HGB 12.5*  HCT 37.0*  MCV 92.7  PLT 253   Basic Metabolic Panel: Recent Labs  Lab 03/28/17 0046 03/28/17 0344 03/28/17 1040  NA 139 140 141  K 5.8* 5.5* 5.8*  CL 101 102 101  CO2 18* 17* 18*  GLUCOSE 134* 130* 113*  BUN 130* 130* 131*  CREATININE 21.14* 21.33* 21.58*  CALCIUM 9.2 9.3 9.1   GFR: Estimated Creatinine Clearance: 5.2 mL/min (A) (by C-G formula based on SCr of 21.58 mg/dL (H)). Liver Function Tests: No results for input(s): AST, ALT, ALKPHOS, BILITOT, PROT, ALBUMIN in the last 168 hours. No results for input(s): LIPASE, AMYLASE in the last 168 hours. No results for input(s): AMMONIA in the last 168 hours. Coagulation Profile: No results for input(s): INR, PROTIME in the last 168 hours. Cardiac Enzymes: Recent Labs  Lab 03/28/17 0344 03/28/17 1040  TROPONINI 0.12* 0.12*   BNP (last 3 results) No results for input(s): PROBNP in the last 8760 hours. HbA1C: No results for input(s): HGBA1C in the last 72 hours. CBG: Recent Labs  Lab 03/28/17 0053 03/28/17 0831  GLUCAP 131* 120*   Lipid Profile: No results for input(s): CHOL, HDL, LDLCALC, TRIG, CHOLHDL, LDLDIRECT in the last 72 hours. Thyroid Function Tests: No results for input(s): TSH, T4TOTAL, FREET4, T3FREE, THYROIDAB in the last 72 hours. Anemia Panel: No results for input(s): VITAMINB12, FOLATE, FERRITIN, TIBC, IRON, RETICCTPCT in the last 72 hours. Sepsis Labs: No results for input(s): PROCALCITON, LATICACIDVEN in the last 168 hours.  No results found for this or any previous visit (from the past 240 hour(s)).       Radiology Studies: Dg Chest 2 View  Result Date: 03/28/2017 CLINICAL DATA:  52 y/o   M; shortness of breath. EXAM: CHEST  2 VIEW COMPARISON:  11/26/2016 chest radiograph FINDINGS: Stable cardiac silhouette given projection and technique. Reticular opacities of the lungs. No focal consolidation. No pleural effusion or pneumothorax identified. Bones are unremarkable. IMPRESSION: Reticular opacities of lungs probably representing interstitial pulmonary edema. Electronically Signed   By: Kristine Garbe M.D.   On: 03/28/2017 01:22        Scheduled Meds: . amLODipine  10 mg Oral Daily  . brimonidine  1 drop Both Eyes BID  . enoxaparin (LOVENOX) injection  90 mg Subcutaneous QHS  . insulin aspart  0-5 Units Subcutaneous QHS  . insulin aspart  0-9 Units Subcutaneous TID WC  . latanoprost  1 drop Both Eyes QHS  . metoprolol succinate  100 mg Oral Daily  . sodium chloride flush  3 mL Intravenous Q12H  . sodium chloride flush  3 mL Intravenous Q12H  . timolol  1  drop Both Eyes BID   Continuous Infusions: . sodium chloride       LOS: 0 days    Time spent: over 30 min    Fayrene Helper, MD [facility] Pager [phone]  If 7PM-7AM, please contact night-coverage [email or web address] Password [email or web address] 03/28/2017, 1:22 PM

## 2017-03-29 ENCOUNTER — Inpatient Hospital Stay (HOSPITAL_COMMUNITY): Payer: Medicare Other

## 2017-03-29 DIAGNOSIS — T8249XA Other complication of vascular dialysis catheter, initial encounter: Secondary | ICD-10-CM

## 2017-03-29 DIAGNOSIS — M7989 Other specified soft tissue disorders: Secondary | ICD-10-CM

## 2017-03-29 DIAGNOSIS — I161 Hypertensive emergency: Secondary | ICD-10-CM

## 2017-03-29 DIAGNOSIS — E8779 Other fluid overload: Secondary | ICD-10-CM

## 2017-03-29 LAB — CBC
HCT: 39.8 % (ref 39.0–52.0)
Hemoglobin: 12.9 g/dL — ABNORMAL LOW (ref 13.0–17.0)
MCH: 30.6 pg (ref 26.0–34.0)
MCHC: 32.4 g/dL (ref 30.0–36.0)
MCV: 94.3 fL (ref 78.0–100.0)
Platelets: 156 10*3/uL (ref 150–400)
RBC: 4.22 MIL/uL (ref 4.22–5.81)
RDW: 14.9 % (ref 11.5–15.5)
WBC: 5.7 10*3/uL (ref 4.0–10.5)

## 2017-03-29 LAB — GLUCOSE, CAPILLARY
Glucose-Capillary: 141 mg/dL — ABNORMAL HIGH (ref 65–99)
Glucose-Capillary: 233 mg/dL — ABNORMAL HIGH (ref 65–99)
Glucose-Capillary: 131 mg/dL — ABNORMAL HIGH (ref 65–99)
Glucose-Capillary: 158 mg/dL — ABNORMAL HIGH (ref 65–99)

## 2017-03-29 LAB — BASIC METABOLIC PANEL
Anion gap: 18 — ABNORMAL HIGH (ref 5–15)
BUN: 68 mg/dL — ABNORMAL HIGH (ref 6–20)
CO2: 23 mmol/L (ref 22–32)
Calcium: 9.3 mg/dL (ref 8.9–10.3)
Chloride: 96 mmol/L — ABNORMAL LOW (ref 101–111)
Creatinine, Ser: 14.41 mg/dL — ABNORMAL HIGH (ref 0.61–1.24)
GFR calc Af Amer: 4 mL/min — ABNORMAL LOW (ref >60)
GFR calc non Af Amer: 3 mL/min — ABNORMAL LOW (ref >60)
Glucose, Bld: 165 mg/dL — ABNORMAL HIGH (ref 65–99)
Potassium: 4.4 mmol/L (ref 3.5–5.1)
Sodium: 137 mmol/L (ref 135–145)

## 2017-03-29 LAB — MRSA PCR SCREENING: MRSA by PCR: POSITIVE — AB

## 2017-03-29 MED ORDER — CHLORHEXIDINE GLUCONATE CLOTH 2 % EX PADS
6.0000 | MEDICATED_PAD | Freq: Every day | CUTANEOUS | Status: DC
  Administered 2017-03-29 – 2017-03-30 (×2): 6 via TOPICAL

## 2017-03-29 MED ORDER — HEPARIN (PORCINE) IN NACL 100-0.45 UNIT/ML-% IJ SOLN
1450.0000 [IU]/h | INTRAMUSCULAR | Status: DC
  Administered 2017-03-29: 1450 [IU]/h via INTRAVENOUS
  Filled 2017-03-29: qty 250

## 2017-03-29 MED ORDER — MUPIROCIN 2 % EX OINT
1.0000 "application " | TOPICAL_OINTMENT | Freq: Two times a day (BID) | CUTANEOUS | Status: DC
  Administered 2017-03-29 – 2017-03-30 (×3): 1 via NASAL
  Filled 2017-03-29: qty 22

## 2017-03-29 NOTE — Progress Notes (Signed)
[facility] Progress Note   Subjective:   S/p 4L UF with HD yesterday, breathing improved today. No CP  Objective Vitals:   03/28/17 2328 03/29/17 0506 03/29/17 0822 03/29/17 1212  BP: (!) 144/87 (!) 165/90 (!) 177/91 (!) 169/86  Pulse: (!) 102 94 95 89  Resp:  16 (!) 26 13  Temp: 98.7 F (37.1 C) 98.3 F (36.8 C) (!) 97.4 F (36.3 C) 98.1 F (36.7 C)  TempSrc: Oral Oral Oral Oral  SpO2: 93% 95% 97% 99%  Weight:  100.6 kg (221 lb 12.5 oz)    Height:       Physical Exam General: Well appearing, NAD Heart: RRR; no murmur Lungs: CTA, scattered crackles in bases Extremities: No LE edema, L arm edema Dialysis Access: LUE AVF + thrill  Additional Objective Labs: Basic Metabolic Panel: Recent Labs  Lab 03/28/17 0344 03/28/17 1040 03/29/17 0327  NA 140 141 137  K 5.5* 5.8* 4.4  CL 102 101 96*  CO2 17* 18* 23  GLUCOSE 130* 113* 165*  BUN 130* 131* 68*  CREATININE 21.33* 21.58* 14.41*  CALCIUM 9.3 9.1 9.3   CBC: Recent Labs  Lab 03/28/17 0046 03/29/17 0327  WBC 7.5 5.7  HGB 12.5* 12.9*  HCT 37.0* 39.8  MCV 92.7 94.3  PLT 165 156   Blood Culture    Component Value Date/Time   SDES URINE, CLEAN CATCH 11/19/2016 0020   SPECREQUEST NONE 11/19/2016 0020   CULT (A) 11/19/2016 0020    30,000 COLONIES/mL ESCHERICHIA COLI Confirmed Extended Spectrum Beta-Lactamase Producer (ESBL) Performed at [facility], [address], [place], [place] [address]    REPTSTATUS 11/22/2016 FINAL 11/19/2016 0020    Cardiac Enzymes: Recent Labs  Lab 03/28/17 0344 03/28/17 1040  TROPONINI 0.12* 0.12*   CBG: Recent Labs  Lab 03/28/17 0831 03/28/17 1752 03/28/17 2127 03/29/17 0827 03/29/17 1215  GLUCAP 120* 170* 202* 141* 233*   Studies/Results: Dg Chest 2 View  Result Date: 03/28/2017 CLINICAL DATA:  52 y/o  M; shortness of breath. EXAM: CHEST  2 VIEW COMPARISON:  11/26/2016 chest radiograph FINDINGS: Stable cardiac silhouette given projection  and technique. Reticular opacities of the lungs. No focal consolidation. No pleural effusion or pneumothorax identified. Bones are unremarkable. IMPRESSION: Reticular opacities of lungs probably representing interstitial pulmonary edema. Electronically Signed   By: Kristine Garbe M.D.   On: 03/28/2017 01:22   Medications: . sodium chloride    . sodium chloride    . sodium chloride    . heparin     . amLODipine  10 mg Oral Daily  . brimonidine  1 drop Both Eyes BID  . Chlorhexidine Gluconate Cloth  6 each Topical Q0600  . heparin  9,000 Units Dialysis Once in dialysis  . insulin aspart  0-5 Units Subcutaneous QHS  . insulin aspart  0-9 Units Subcutaneous TID WC  . latanoprost  1 drop Both Eyes QHS  . metoprolol succinate  100 mg Oral Daily  . mupirocin ointment  1 application Nasal BID  . sodium chloride flush  3 mL Intravenous Q12H  . sodium chloride flush  3 mL Intravenous Q12H  . timolol  1 drop Both Eyes BID    Dialysis Orders: MWF  4h 8min   99.5kg   Heparin 9000 AVG LUA -hect 1  -sensipar 90 tiw at hd   Home meds: -norvasc 10/ toprol xl 100 qd -humalog insulin tid -eye drops -ecasa 81  Impression: 1. SOB / pulm edema -  S/p urgent HD 1/7. Next HD planned for 1/9. AVG is not clotted on exam.  May need fistulogram for new swelling in LUE, ordered for tomorrow. 2. Mild hyperkalemia: resolved with HD. 3. ESRD on HD MWF, next 1/9. 4. HTN - cont meds 5. DM - per primary   Veneta Penton, PA-C 03/29/2017, 1:05 PM  [facility] Pager: [phone]  Pt seen, examined, agree w assess/plan as above with additions as indicated.  Kelly Splinter MD [facility] pager [phone]    cell [phone] 03/29/2017, 3:11 PM   ]

## 2017-03-29 NOTE — Progress Notes (Signed)
PROGRESS NOTE    Howard Bell  QAS:[id number] DOB: Jul 17, 1965 DOA: 03/27/2017 PCP: No primary care provider on file.  Brief Narrative:  Howard Bell a 52 y.o.malewith medical history significant forend-stage renal disease, insulin-dependent diabetes mellitus, and hypertension, and other comorbids now presenting to the emergency department for evaluation of shortness of breath. Patient reported that he missed his dialysis session on due to transportation issues and was last dialyzed on 03/23/2017. He had developed increasing dyspnea over the past couple days but denies chest pain, fevers, chills, or headaches. Also Reported that he has been out of his blood pressure medications and insulin.Left arm began aching yesterday and he noted it to be swollen. No trauma to the arm, no fever, no wound or drainage.Admitted for Volume Overload and Acute Respiratory Failure with Hypoxia. His Left Arm is currently being worked up and Vascular Duplex is pending and patient is on empiric Heparin gtt. Patient to undergo Fistulogram in AM.   Assessment & Plan:   Principal Problem:   ESRD needing dialysis ([place]) Active Problems:   Acute respiratory failure with hypoxemia (HCC)   Diabetes mellitus with complication (HCC)   Hypertensive emergency   Elevated troponin   Essential hypertension   Hyperkalemia   Left arm swelling   ESRD (end stage renal disease) (HCC)  Acute Respiratory Failure with Hypoxia likely from Flash Pulmonary Edema in the setting of Hypertensive Urgency and ESRD, improved -Presented with SOB after missing HD on 1/4 due to Transportation issues, last dialyzed on 1/2 -Found to be hypoxic in low 80's on rm air with pulmonary edema on CXR -Potassium mildly elevated without EKG changes -Mild metabolic acidosis with elevated AG noted -Nephrology was consulted by ED physician and pt will be admitted to [facility] for HD -Continue cardiac monitoring, supplemental O2, SLIV,  fluid-restrict -Repeat CM in AM  ESRD on HD -Nephrology consulted for further evaluation and Management  -Further Management per Nephrology -Patient to be dialyzed in AM -Repeat CMP in AM   Metabolic Acidosis -Improving. AG went from 22 -> 18 -C/w Dialysis -Repeat CMP in AM  Uremia -Improving. Patient's BUN/Cr went from 131/21.58 -> 68/14.41 -C/w Dialysis  -Repeat CMP in AM   Insulin-dependent DM -HbA1c was 8.5% in November 2018 -Managed at home with Humalog 10 units TID, though pt states has not taken in weeks -C/w Sensitive Novolog SSI AC/HS -CBG's ranging from 141-233  Hypertension with Hypertensive Urgency -BP elevated in ED, likely secondary to hypervolemia and non-adherence with BP medicines -Treated with labetalol IVP in ED -Resumed Metoprolol Succinate 100 mg po Daily, Amlodipine 10 mg po Daily -C/w IV Hydralazine 10 mg q4hprn of SBP >175 or DBP >90 -Nephrology consulted for HD and Volume Maintenance   Elevated Troponin -No Anginal Complaints -Troponin Flat at 0.11 -> 0.12 -> 0.12 -Will continue Cardiac Monitoring,  -Continue Metoprolol Succinate 100 mg po Daily   Left Arm Pain and Swelling  - No fluctuance, no palpable cord, fistula with thrill  - DVT was suspected and Lovenox started with venous US pending; Lovenox was discontinued and started on IV Heparin gtt -Left Arm U/S ordered and pending read; If negative will D/C IV Heparin  -Per Nephrology, they are ordered Fistulogram for AM  Glaucoma  -C/w Brimonidine 02% 1 drop Both eyes BID, Latanoprost 1 drop Both Eyes qDaily, and Timolol 1 drop Both Eyes BID  Normocytic Anemia/Anemia of Chronic Disease -Hb/Hct went from 12.5/37.0 -> 12.9/39.8 -Continue to Monitor for S/Sx of Bleeding -Repeat CBC in AM  DVT prophylaxis: Was on full dose Lovenox and changed to IV Heparin but D/C'd by Nephrology   Code Status: FULL CODE Family Communication: No family present at bedside Disposition Plan: Remain  Inpatient   Consultants:   Nephrology Dr. Jonnie Finner   Procedures:  Hemodialysis  Left Arm Fistulogram in AM   Antimicrobials:  Anti-infectives (From admission, onward)   None     Subjective: Seen and examined and was awoken from sleep. States SOB is better an thinks arm is less swollen today. No CP or Nausea.   Objective: Vitals:   03/28/17 2042 03/28/17 2100 03/28/17 2328 03/29/17 0506  BP:  (!) 165/94 (!) 144/87 (!) 165/90  Pulse:  (!) 103 (!) 102 94  Resp:    16  Temp: 98.3 F (36.8 C)  98.7 F (37.1 C) 98.3 F (36.8 C)  TempSrc: Oral  Oral Oral  SpO2:  94% 93% 95%  Weight:    100.6 kg (221 lb 12.5 oz)  Height:        Intake/Output Summary (Last 24 hours) at 03/29/2017 0753 Last data filed at 03/28/2017 1614 Gross per 24 hour  Intake -  Output 4000 ml  Net -4000 ml   Filed Weights   03/28/17 1207 03/28/17 1614 03/29/17 0506  Weight: 105.7 kg (233 lb 0.4 oz) 101.7 kg (224 lb 3.3 oz) 100.6 kg (221 lb 12.5 oz)   Examination: Physical Exam:  Constitutional: WN/WD obese AAM in NAD and appears calm and comfortable Eyes: Lids and conjunctivae normal, sclerae anicteric  ENMT: External Ears, Nose appear normal. Grossly normal hearing. Mucous membranes are moist. Neck: Appears normal, supple, no cervical masses, normal ROM, no appreciable thyromegaly; no JVD Respiratory: Diminished to auscultation bilaterally, no wheezing, rales, rhonchi or crackles. Normal respiratory effort and patient is not tachypenic. No accessory muscle use.  Cardiovascular: Tachycardic rate but regular rhythm, no appreciable m/r/g. S1 and S2 auscultated. Had some extremity edema and Left arm swollen Abdomen: Soft, non-tender, non-distended. No masses palpated. No appreciable hepatosplenomegaly. Bowel sounds positive x4.  GU: Deferred. Musculoskeletal: No clubbing / cyanosis of digits/nails. No joint deformity upper and lower extremities. Has Left Arm AVF with good bruit and thrill Skin: No  rashes, lesions, ulcers on a limited skin eval. No induration; Warm and dry.  Neurologic: CN 2-12 grossly intact with no focal deficits. Romberg sign and cerebellar reflexes not assessed.  Psychiatric: Normal judgment and insight. Alert and oriented x 3. Normal mood and appropriate affect.   Data Reviewed: I have personally reviewed following labs and imaging studies  CBC: Recent Labs  Lab 03/28/17 0046 03/29/17 0327  WBC 7.5 5.7  HGB 12.5* 12.9*  HCT 37.0* 39.8  MCV 92.7 94.3  PLT 165 865   Basic Metabolic Panel: Recent Labs  Lab 03/28/17 0046 03/28/17 0344 03/28/17 1040 03/29/17 0327  NA 139 140 141 137  K 5.8* 5.5* 5.8* 4.4  CL 101 102 101 96*  CO2 18* 17* 18* 23  GLUCOSE 134* 130* 113* 165*  BUN 130* 130* 131* 68*  CREATININE 21.14* 21.33* 21.58* 14.41*  CALCIUM 9.2 9.3 9.1 9.3   GFR: Estimated Creatinine Clearance: 7.6 mL/min (A) (by C-G formula based on SCr of 14.41 mg/dL (H)). Liver Function Tests: No results for input(s): AST, ALT, ALKPHOS, BILITOT, PROT, ALBUMIN in the last 168 hours. No results for input(s): LIPASE, AMYLASE in the last 168 hours. No results for input(s): AMMONIA in the last 168 hours. Coagulation Profile: No results for input(s): INR, PROTIME in the last  168 hours. Cardiac Enzymes: Recent Labs  Lab 03/28/17 0344 03/28/17 1040  TROPONINI 0.12* 0.12*   BNP (last 3 results) No results for input(s): PROBNP in the last 8760 hours. HbA1C: No results for input(s): HGBA1C in the last 72 hours. CBG: Recent Labs  Lab 03/28/17 0053 03/28/17 0831 03/28/17 1752 03/28/17 2127  GLUCAP 131* 120* 170* 202*   Lipid Profile: No results for input(s): CHOL, HDL, LDLCALC, TRIG, CHOLHDL, LDLDIRECT in the last 72 hours. Thyroid Function Tests: No results for input(s): TSH, T4TOTAL, FREET4, T3FREE, THYROIDAB in the last 72 hours. Anemia Panel: No results for input(s): VITAMINB12, FOLATE, FERRITIN, TIBC, IRON, RETICCTPCT in the last 72 hours. Sepsis  Labs: No results for input(s): PROCALCITON, LATICACIDVEN in the last 168 hours.  Recent Results (from the past 240 hour(s))  MRSA PCR Screening     Status: Abnormal   Collection Time: 03/28/17  5:22 PM  Result Value Ref Range Status   MRSA by PCR POSITIVE (A) NEGATIVE Final    Comment:        The GeneXpert MRSA Assay (FDA approved for NASAL specimens only), is one component of a comprehensive MRSA colonization surveillance program. It is not intended to diagnose MRSA infection nor to guide or monitor treatment for MRSA infections. RESULT CALLED TO, READ BACK BY AND VERIFIED WITH: INGRAM,M RN [id number] 03/28/17 PITT,L     Radiology Studies: Dg Chest 2 View  Result Date: 03/28/2017 CLINICAL DATA:  52 y/o  M; shortness of breath. EXAM: CHEST  2 VIEW COMPARISON:  11/26/2016 chest radiograph FINDINGS: Stable cardiac silhouette given projection and technique. Reticular opacities of the lungs. No focal consolidation. No pleural effusion or pneumothorax identified. Bones are unremarkable. IMPRESSION: Reticular opacities of lungs probably representing interstitial pulmonary edema. Electronically Signed   By: Kristine Garbe M.D.   On: 03/28/2017 01:22   Scheduled Meds: . amLODipine  10 mg Oral Daily  . brimonidine  1 drop Both Eyes BID  . Chlorhexidine Gluconate Cloth  6 each Topical Q0600  . enoxaparin (LOVENOX) injection  90 mg Subcutaneous QHS  . heparin  9,000 Units Dialysis Once in dialysis  . insulin aspart  0-5 Units Subcutaneous QHS  . insulin aspart  0-9 Units Subcutaneous TID WC  . latanoprost  1 drop Both Eyes QHS  . metoprolol succinate  100 mg Oral Daily  . mupirocin ointment  1 application Nasal BID  . sodium chloride flush  3 mL Intravenous Q12H  . sodium chloride flush  3 mL Intravenous Q12H  . timolol  1 drop Both Eyes BID   Continuous Infusions: . sodium chloride    . sodium chloride    . sodium chloride      LOS: 1 day   Kerney Elbe, DO [facility] Pager [phone]  If 7PM-7AM, please contact night-coverage [email or web address] Password [facility] 03/29/2017, 7:53 AM

## 2017-03-29 NOTE — Progress Notes (Signed)
*  PRELIMINARY RESULTS* Vascular Ultrasound Left upper extremity venous duplex has been completed.  Preliminary findings: Difficult visualization of the left subclavian, limited visualization of brachial veins.  Difficult exam due to patient cooperation, body habitus, calcified AVF and edema.  No visualized deep or superficial vein thrombosis seen on this exam.  Myrtie Cruise Balian Schaller 03/29/2017, 4:22 PM

## 2017-03-29 NOTE — Progress Notes (Signed)
[place] for heparin Indication: rule out DVT  No Known Allergies  Patient Measurements: Height: 6' (182.9 cm) Weight: 221 lb 12.5 oz (100.6 kg) IBW/kg (Calculated) : 77.6 Heparin Dosing Weight: 90.7 kg  Vital Signs: Temp: 97.4 F (36.3 C) (01/08 0822) Temp Source: Oral (01/08 0822) BP: 177/91 (01/08 0822) Pulse Rate: 95 (01/08 0822)  Labs: Recent Labs    03/28/17 0046 03/28/17 0344 03/28/17 1040 03/29/17 0327  HGB 12.5*  --   --  12.9*  HCT 37.0*  --   --  39.8  PLT 165  --   --  156  CREATININE 21.14* 21.33* 21.58* 14.41*  TROPONINI  --  0.12* 0.12*  --     Estimated Creatinine Clearance: 7.6 mL/min (A) (by C-G formula based on SCr of 14.41 mg/dL (H)).   Medical History: Past Medical History:  Diagnosis Date  . Congestive heart failure (CHF) ([place])   . 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 ESRD (end stage renal disease) on dialysis ([place])   . Hypertension     Medications:  Scheduled:  . amLODipine  10 mg Oral Daily  . brimonidine  1 drop Both Eyes BID  . Chlorhexidine Gluconate Cloth  6 each Topical Q0600  . heparin  9,000 Units Dialysis Once in dialysis  . insulin aspart  0-5 Units Subcutaneous QHS  . insulin aspart  0-9 Units Subcutaneous TID WC  . latanoprost  1 drop Both Eyes QHS  . metoprolol succinate  100 mg Oral Daily  . mupirocin ointment  1 application Nasal BID  . sodium chloride flush  3 mL Intravenous Q12H  . sodium chloride flush  3 mL Intravenous Q12H  . timolol  1 drop Both Eyes BID    Assessment: 33 yom with suspected DVT d/t left arm pain and swelling. Patient has hx of ESRD on HD. Was started on full dose enoxaparin on 1/7- received a dose at 0116 and then 2124. CBC remain stable. No signs/symptoms of bleeding. No anticoagulation prior to admission.    Pharmacy consulted to transition to IV heparin infusion. Will start at the time when next enoxaparin dose would be  due.   Goal of Therapy:  Heparin level 0.3-0.7 units/ml Monitor platelets by anticoagulation protocol: Yes   Plan:  Start heparin infusion at 1450 units/hr tonight at 2130 Check anti-Xa level in 8 hours and daily while on heparin Continue to monitor H&H and platelets  Monitor for signs/symptoms of bleeding Follow-up with DVT workout  Doylene Canard, PharmD Clinical Pharmacist  Pager: [phone] Clinical Phone for 03/29/2017 until 3:30pm: [phone] If after 3:30pm, please call main pharmacy at [phone] 03/29/2017,8:46 AM

## 2017-03-30 ENCOUNTER — Encounter (HOSPITAL_COMMUNITY): Payer: Self-pay | Admitting: Interventional Radiology

## 2017-03-30 ENCOUNTER — Inpatient Hospital Stay (HOSPITAL_COMMUNITY): Payer: Medicare Other

## 2017-03-30 DIAGNOSIS — Z992 Dependence on renal dialysis: Secondary | ICD-10-CM

## 2017-03-30 DIAGNOSIS — E118 Type 2 diabetes mellitus with unspecified complications: Secondary | ICD-10-CM

## 2017-03-30 DIAGNOSIS — R748 Abnormal levels of other serum enzymes: Secondary | ICD-10-CM

## 2017-03-30 DIAGNOSIS — E875 Hyperkalemia: Secondary | ICD-10-CM

## 2017-03-30 DIAGNOSIS — J9601 Acute respiratory failure with hypoxia: Secondary | ICD-10-CM

## 2017-03-30 DIAGNOSIS — N186 End stage renal disease: Secondary | ICD-10-CM

## 2017-03-30 DIAGNOSIS — I10 Essential (primary) hypertension: Secondary | ICD-10-CM

## 2017-03-30 DIAGNOSIS — I160 Hypertensive urgency: Secondary | ICD-10-CM

## 2017-03-30 HISTORY — PX: IR AV DIALY SHUNT INTRO NEEDLE/INTRACATH INITIAL W/PTA/IMG LEFT: IMG6103

## 2017-03-30 LAB — COMPREHENSIVE METABOLIC PANEL
ALT: 13 U/L — ABNORMAL LOW (ref 17–63)
AST: 17 U/L (ref 15–41)
Albumin: 3.5 g/dL (ref 3.5–5.0)
Alkaline Phosphatase: 131 U/L — ABNORMAL HIGH (ref 38–126)
Anion gap: 18 — ABNORMAL HIGH (ref 5–15)
BUN: 82 mg/dL — ABNORMAL HIGH (ref 6–20)
CO2: 24 mmol/L (ref 22–32)
Calcium: 9.7 mg/dL (ref 8.9–10.3)
Chloride: 94 mmol/L — ABNORMAL LOW (ref 101–111)
Creatinine, Ser: 17.35 mg/dL — ABNORMAL HIGH (ref 0.61–1.24)
GFR calc Af Amer: 3 mL/min — ABNORMAL LOW (ref >60)
GFR calc non Af Amer: 3 mL/min — ABNORMAL LOW (ref >60)
Glucose, Bld: 170 mg/dL — ABNORMAL HIGH (ref 65–99)
Potassium: 5.5 mmol/L — ABNORMAL HIGH (ref 3.5–5.1)
Sodium: 136 mmol/L (ref 135–145)
Total Bilirubin: 1.1 mg/dL (ref 0.3–1.2)
Total Protein: 7.9 g/dL (ref 6.5–8.1)

## 2017-03-30 LAB — CBC WITH DIFFERENTIAL/PLATELET
Basophils Absolute: 0.1 10*3/uL (ref 0.0–0.1)
Basophils Relative: 1 %
Eosinophils Absolute: 0.2 10*3/uL (ref 0.0–0.7)
Eosinophils Relative: 4 %
HCT: 39.6 % (ref 39.0–52.0)
Hemoglobin: 13 g/dL (ref 13.0–17.0)
Lymphocytes Relative: 34 %
Lymphs Abs: 2.2 10*3/uL (ref 0.7–4.0)
MCH: 30.8 pg (ref 26.0–34.0)
MCHC: 32.8 g/dL (ref 30.0–36.0)
MCV: 93.8 fL (ref 78.0–100.0)
Monocytes Absolute: 0.6 10*3/uL (ref 0.1–1.0)
Monocytes Relative: 9 %
Neutro Abs: 3.5 10*3/uL (ref 1.7–7.7)
Neutrophils Relative %: 52 %
Platelets: 147 10*3/uL — ABNORMAL LOW (ref 150–400)
RBC: 4.22 MIL/uL (ref 4.22–5.81)
RDW: 14.7 % (ref 11.5–15.5)
WBC: 6.6 10*3/uL (ref 4.0–10.5)

## 2017-03-30 LAB — MAGNESIUM: Magnesium: 2.8 mg/dL — ABNORMAL HIGH (ref 1.7–2.4)

## 2017-03-30 LAB — PHOSPHORUS: Phosphorus: 11.3 mg/dL — ABNORMAL HIGH (ref 2.5–4.6)

## 2017-03-30 LAB — GLUCOSE, CAPILLARY
Glucose-Capillary: 154 mg/dL — ABNORMAL HIGH (ref 65–99)
Glucose-Capillary: 193 mg/dL — ABNORMAL HIGH (ref 65–99)

## 2017-03-30 MED ORDER — FENTANYL CITRATE (PF) 100 MCG/2ML IJ SOLN
INTRAMUSCULAR | Status: AC | PRN
  Administered 2017-03-30: 25 ug via INTRAVENOUS

## 2017-03-30 MED ORDER — METOPROLOL SUCCINATE ER 100 MG PO TB24: 100.0000 mg | ORAL_TABLET | Freq: Every day | ORAL | 0 refills | Status: DC

## 2017-03-30 MED ORDER — DICLOFENAC SODIUM 1 % TD GEL: 4.0000 g | Freq: Four times a day (QID) | TRANSDERMAL | Status: DC | PRN

## 2017-03-30 MED ORDER — HEPARIN SODIUM (PORCINE) 5000 UNIT/ML IJ SOLN: 5000.0000 [IU] | Freq: Three times a day (TID) | INTRAMUSCULAR | Status: DC

## 2017-03-30 MED ORDER — MIDAZOLAM HCL 2 MG/2ML IJ SOLN
INTRAMUSCULAR | Status: AC | PRN
  Administered 2017-03-30: 1 mg via INTRAVENOUS

## 2017-03-30 MED ORDER — MIDAZOLAM HCL 2 MG/2ML IJ SOLN
INTRAMUSCULAR | Status: AC
  Filled 2017-03-30: qty 2

## 2017-03-30 MED ORDER — INSULIN LISPRO 100 UNIT/ML (KWIKPEN): 0.0000 [IU] | PEN_INJECTOR | Freq: Three times a day (TID) | SUBCUTANEOUS | Status: DC

## 2017-03-30 MED ORDER — IOPAMIDOL (ISOVUE-300) INJECTION 61%
INTRAVENOUS | Status: AC
  Administered 2017-03-30: 60 mL
  Filled 2017-03-30: qty 100

## 2017-03-30 MED ORDER — IOPAMIDOL (ISOVUE-300) INJECTION 61%
INTRAVENOUS | Status: AC
  Filled 2017-03-30: qty 50

## 2017-03-30 MED ORDER — LIDOCAINE HCL (PF) 1 % IJ SOLN
INTRAMUSCULAR | Status: AC | PRN
  Administered 2017-03-30: 5 mL

## 2017-03-30 MED ORDER — LIDOCAINE HCL 1 % IJ SOLN
INTRAMUSCULAR | Status: AC
  Filled 2017-03-30: qty 20

## 2017-03-30 MED ORDER — AMLODIPINE BESYLATE 10 MG PO TABS: 10.0000 mg | ORAL_TABLET | Freq: Every day | ORAL | 0 refills | Status: DC

## 2017-03-30 MED ORDER — ZOLPIDEM TARTRATE 5 MG PO TABS: 5.0000 mg | ORAL_TABLET | Freq: Every evening | ORAL | Status: DC | PRN

## 2017-03-30 MED ORDER — FENTANYL CITRATE (PF) 100 MCG/2ML IJ SOLN
INTRAMUSCULAR | Status: AC
  Filled 2017-03-30: qty 2

## 2017-03-30 MED ORDER — ZOLPIDEM TARTRATE 5 MG PO TABS
5.0000 mg | ORAL_TABLET | Freq: Once | ORAL | Status: AC
  Administered 2017-03-30: 5 mg via ORAL
  Filled 2017-03-30: qty 1

## 2017-03-30 MED ORDER — IOPAMIDOL (ISOVUE-300) INJECTION 61%
INTRAVENOUS | Status: AC
  Filled 2017-03-30: qty 100

## 2017-03-30 NOTE — Consult Note (Signed)
Chief Complaint: Patient was seen in consultation today for renal failure   Referring Physician(s): Stovall,Kathryn R  Supervising Physician: Arne Cleveland  Patient Status: [facility] - In-pt  History of Present Illness: Howard Bell is a 52 y.o. male with past medical history of CHF, DM, ESRD, HTN who presented to [facility] ED with shortness of breath.  Patient with left arm swelling.  IR consulted for fistulogram and possible angioplasty vs. Stenting.   Patient has been NPO today.   Past Medical History:  Diagnosis Date  . Congestive heart failure (CHF) ([place])   . 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 ESRD (end stage renal disease) on dialysis ([place])   . Hypertension     Past Surgical History:  Procedure Laterality Date  . HERNIA REPAIR      Allergies: Patient has no known allergies.  Medications: Prior to Admission medications   Medication Sig Start Date End Date Taking? Authorizing Provider  amLODipine (NORVASC) 10 MG tablet Take 10 mg by mouth daily.    Yes [provider]  brimonidine (ALPHAGAN) 0.2 % ophthalmic solution Place 1 drop into both eyes 2 (two) times daily.   Yes [provider]  diclofenac sodium (VOLTAREN) 1 % GEL Apply 4 g topically 4 (four) times daily. Patient taking differently: Apply 4 g topically 4 (four) times daily as needed (pain).  11/27/16  Yes Debbe Odea, MD  insulin lispro (HUMALOG KWIKPEN) 100 UNIT/ML KiwkPen Inject 0.1 mLs (10 Units total) into the skin 3 (three) times daily. And pen needles 3/day. 02/23/17  Yes Renato Shin, MD  metoprolol succinate (TOPROL-XL) 100 MG 24 hr tablet Take 1 tablet (100 mg total) by mouth daily. Take with or immediately following a meal. 08/07/16  Yes Thurnell Lose, MD  timolol (BETIMOL) 0.5 % ophthalmic solution Place 1 drop into both eyes 2 (two) times daily.   Yes [provider]  travoprost, benzalkonium, (TRAVATAN) 0.004 % ophthalmic solution  Place 1 drop into both eyes at bedtime.   Yes [provider]  aspirin EC 81 MG tablet Take 1 tablet (81 mg total) by mouth daily. Patient not taking: Reported on 03/28/2017 08/06/16   Thurnell Lose, MD     Family History  Problem Relation Age of Onset  . Diabetes Mother   . Hypertension Father     Social History   Socioeconomic History  . Marital status: Married    Spouse name: None  . Number of children: None  . Years of education: None  . Highest education level: None  Social Needs  . Financial resource strain: None  . Food insecurity - worry: None  . Food insecurity - inability: None  . Transportation needs - medical: None  . Transportation needs - non-medical: None  Occupational History  . None  Tobacco Use  . Smoking status: Never Smoker  . Smokeless tobacco: Never Used  Substance and Sexual Activity  . Alcohol use: No  . Drug use: No  . Sexual activity: None  Other Topics Concern  . None  Social History Narrative  . None    Review of Systems  Constitutional: Negative for fatigue and fever.  Respiratory: Negative for cough and shortness of breath.   Cardiovascular: Negative for chest pain.  Gastrointestinal: Negative for abdominal pain.  Psychiatric/Behavioral: Negative for behavioral problems and confusion.    Vital Signs: BP (!) 156/108 (BP Location: Right Arm)   Pulse 81   Temp (!) 97.4 F (36.3  C) (Oral)   Resp 14   Ht 6' (1.829 m)   Wt 226 lb 1.6 oz (102.6 kg)   SpO2 93%   BMI 30.66 kg/m   Physical Exam  Constitutional: He is oriented to person, place, and time. He appears well-developed.  Pulmonary/Chest: Effort normal. He has wheezes (mild) in the right upper field and the left upper field.  Abdominal: Soft.  Neurological: He is alert and oriented to person, place, and time.  Skin: Skin is warm and dry.  Psychiatric: He has a normal mood and affect. His behavior is normal.  Nursing note and vitals reviewed.   Imaging: Dg  Chest 2 View  Result Date: 03/28/2017 CLINICAL DATA:  52 y/o  M; shortness of breath. EXAM: CHEST  2 VIEW COMPARISON:  11/26/2016 chest radiograph FINDINGS: Stable cardiac silhouette given projection and technique. Reticular opacities of the lungs. No focal consolidation. No pleural effusion or pneumothorax identified. Bones are unremarkable. IMPRESSION: Reticular opacities of lungs probably representing interstitial pulmonary edema. Electronically Signed   By: Kristine Garbe M.D.   On: 03/28/2017 01:22    Labs:  CBC: Recent Labs    11/26/16 1625 11/26/16 2222 03/28/17 0046 03/29/17 0327  WBC 5.3 5.3 7.5 5.7  HGB 12.7* 13.2 12.5* 12.9*  HCT 38.7* 39.4 37.0* 39.8  PLT 136* 140* 165 156    COAGS: No results for input(s): INR, APTT in the last 8760 hours.  BMP: Recent Labs    03/28/17 0046 03/28/17 0344 03/28/17 1040 03/29/17 0327  NA 139 140 141 137  K 5.8* 5.5* 5.8* 4.4  CL 101 102 101 96*  CO2 18* 17* 18* 23  GLUCOSE 134* 130* 113* 165*  BUN 130* 130* 131* 68*  CALCIUM 9.2 9.3 9.1 9.3  CREATININE 21.14* 21.33* 21.58* 14.41*  GFRNONAA 2* 2* 2* 3*  GFRAA 3* 2* 2* 4*    LIVER FUNCTION TESTS: Recent Labs    08/05/16 0140 11/18/16 2132 11/26/16 1625  BILITOT 1.2 0.6 0.9  AST 16 25 23   ALT 11* 18 15*  ALKPHOS 109 154* 138*  PROT 6.8 8.4* 8.3*  ALBUMIN 3.2* 3.9 3.6    TUMOR MARKERS: No results for input(s): AFPTM, CEA, CA199, CHROMGRNA in the last 8760 hours.  Assessment and Plan: Patient with past medical history of renal failure presents with complaint of LUE swelling.  IR consulted for fistulogram with possible angioplasty/stenting at the request of Dr. Jonnie Finner. Case reviewed by Dr. Vernard Gambles who approves patient for procedure. Fistulogram demonstrates abnormal innominant vessel.  Will proceed with angioplasty/stenting. Patient presents today in their usual state of health.  He has been NPO.  Heparin infusing.   Risks and benefits discussed with the  patient including, but not limited to bleeding, infection, vascular injury, need for tunneled HD catheter placement or even death. All of the patient's questions were answered, patient is agreeable to proceed. Consent signed and in chart.  Thank you for this interesting consult.  I greatly enjoyed meeting Lucero Ide and look forward to participating in their care.  A copy of this report was sent to the requesting provider on this date.  Electronically Signed: Docia Barrier, PA 03/30/2017, 9:24 AM   I spent a total of 40 Minutes    in face to face in clinical consultation, greater than 50% of which was counseling/coordinating care for renal failure, LUE swelling.

## 2017-03-30 NOTE — Discharge Summary (Signed)
Physician Discharge Summary  Howard Bell ZOX:[id number] DOB: 12/08/1965  PCP: No primary care provider on file.  Admit date: 03/27/2017 Discharge date: 03/30/2017  Recommendations for Outpatient Follow-up:  1. Hemodialysis Center: Advised to continue regular hemodialysis appointments on Mondays, Wednesdays and Fridays. 2. Dr. Renato Shin, Endocrinology in 1 week. 3. Case management consulted to assist patient with new PCP appointment in 1 week and any transportation needs to come for outpatient dialysis. 4. Recommend outpatient 2-D echo.  Home Health: None Equipment/Devices: None    Discharge Condition: Improved and stable.  CODE STATUS: Full  Diet recommendation: Heart healthy & diabetic diet.  Discharge Diagnoses:  Principal Problem:   ESRD needing dialysis ([place]) Active Problems:   Acute respiratory failure with hypoxemia (HCC)   Diabetes mellitus with complication (HCC)   Hypertensive emergency   Elevated troponin   Essential hypertension   Hyperkalemia   Left arm swelling   ESRD (end stage renal disease) ([place])   Brief Summary: Howard Bell a 52 y.o.malewith medical history significant forend-stage renal disease on MWF HD, insulin-dependent diabetes mellitus, HTN, ongoing issues with noncompliance with medications and HD,  now presented to the emergency department for evaluation of shortness of breath. Patient reported that he missed his dialysis session due to transportation issues and was last dialyzed on 03/23/2017. He had developed increasing dyspnea over the past couple days but denied chest pain, fevers, chills, or headaches. Also reported that he has been out of his blood pressure medications (lost and not filled).Has insulins but had not taken it since December. Left arm began aching day prior to admission and he noted it to be swollen. No trauma to the arm, no fever, no wound or drainage.Admitted for Volume Overload and Acute Respiratory Failure with  Hypoxia.   Assessment & Plan:   Acute Respiratory Failure with Hypoxia likely from Flash Pulmonary Edema in the setting of Hypertensive Urgency and ESRD. -Presented with SOB after missing HD on 1/4 due to Transportation issues, last dialyzed on 1/2 -Found to be hypoxic in low 80's on rm air with pulmonary edema on CXR -Potassium mildly elevated without EKG changes -Mild metabolic acidosis with elevated AG noted -Nephrology was consulted. He underwent urgent HD on 1/7 and again on 1/9. - Volume status much improved and continue outpatient management across HD. Discussed with nephrology who have cleared him for discharge after 2 days dialysis. Consulted case management to assist patient with outpatient transportation to dialysis. - Patient is noncompliant with several aspects of his care including medications and HD. Counseled extensively regarding importance of compliance with all aspects of medical care. He possibly has acute on chronic diastolic CHF related to his long-standing poorly controlled hypertension. However there is no 2-D echo noted in system. Since patient at this time is stable for discharge home, this can be pursued during outpatient follow-up. - Also recommend follow-up chest x-ray in 3-4 weeks to ensure clearance.  ESRD on MWF HD - Nephrology was consulted for dialysis needs and underwent dialysis on 1/7 and again on 1/9. - Left upper extremity was swollen. Venous Doppler which was limited but did not show DVT. Underwent fistulogram and left upper extremity innominate vein angioplasty. - Mild hyperkalemia/5.5 noted today prior to dialysis but he underwent HD after that. - Discussed with nephrology who have cleared him for discharge home.  Metabolic Acidosis - Secondary to ESRD and missing HD. Improved after dialysis.  Uremia - Improved after HD.  Insulin-dependent DM -HbA1c was 8.5% in November 2018 -Supposed to be  on Humalog 10 units TID, though pt states has  not taken in weeks - He was placed on NovoLog SSI and has required very little of that. Changed to SSI at discharge but not sure how compliant he will be. Outpatient follow-up with endocrinology.  Hypertension with Hypertensive Urgency -BP elevated in ED, likely secondary to hypervolemia and non-adherence with BP medicines -Treated with labetalol IVP in ED -Resumed Metoprolol Succinate 100 mg po Daily, Amlodipine 10 mg po Daily - Nephrology assisted with volume management. As per discussion with nephrology, he came in significantly volume overloaded. - Blood pressure is much improved.  Elevated Troponin - No chest pain reported -Troponin Flat at 0.11 -> 0.12 -> 0.12 -Continue Metoprolol Succinate 100 mg po Daily - Recommend outpatient 2-D echo to assess LV function.    Left Arm Pain and Swelling  - No fluctuance, no palpable cord, fistula with thrill  - DVT was suspected and Lovenox started with venous US pending; Lovenox was discontinued and started on IV Heparin gtt. After DVT ruled out, heparin discontinued. - Left upper extremity venous Doppler was a difficult study (patient cooperation, body habitus, calcified AVF and edema) but no obvious DVT. - His left upper extremity symptoms were probably due to hemodynamically significant left innominate vein stenosis which was angioplastied by IR on 1/9.  Glaucoma  -C/w Brimonidine 02% 1 drop Both eyes BID, Latanoprost 1 drop Both Eyes qDaily, and Timolol 1 drop Both Eyes BID - Patient has his eyedrops at home but reportedly noncompliant.  Normocytic Anemia/Anemia of Chronic Disease - Stable.  Noncompliance  - extensively counseled.  Mild thrombocytopenia - Follow CBCs at next dialysis as outpatient.  Consultants:   Nephrology   Procedures:  Hemodialysis Left Arm Fistulogram with angioplasty   Discharge Instructions  Discharge Instructions    (HEART FAILURE PATIENTS) Call MD:  Anytime you have any of the  following symptoms: 1) 3 pound weight gain in 24 hours or 5 pounds in 1 week 2) shortness of breath, with or without a dry hacking cough 3) swelling in the hands, feet or stomach 4) if you have to sleep on extra pillows at night in order to breathe.   Complete by:  As directed    Call MD for:  difficulty breathing, headache or visual disturbances   Complete by:  As directed    Call MD for:  extreme fatigue   Complete by:  As directed    Call MD for:  persistant dizziness or light-headedness   Complete by:  As directed    Call MD for:  severe uncontrolled pain   Complete by:  As directed    Diet - low sodium heart healthy   Complete by:  As directed    Diet Carb Modified   Complete by:  As directed    Increase activity slowly   Complete by:  As directed        Medication List    STOP taking these medications   aspirin EC 81 MG tablet     TAKE these medications   amLODipine 10 MG tablet Commonly known as:  NORVASC Take 1 tablet (10 mg total) by mouth daily.   brimonidine 0.2 % ophthalmic solution Commonly known as:  ALPHAGAN Place 1 drop into both eyes 2 (two) times daily.   diclofenac sodium 1 % Gel Commonly known as:  VOLTAREN Apply 4 g topically 4 (four) times daily as needed (pain).   insulin lispro 100 UNIT/ML KiwkPen Commonly known as:  HUMALOG  KWIKPEN Inject 0-0.09 mLs (0-9 Units total) into the skin 3 (three) times daily with meals. CBG < 70: Eat or drink something sweet and recheck. CBG 70 - 120: 0 units CBG 121 - 150: 1 unit CBG 151 - 200: 2 units CBG 201 - 250: 3 units CBG 251 - 300: 5 units CBG 301 - 350: 7 units CBG 351 - 400: 9 units CBG > 400: call MD. What changed:    how much to take  when to take this  additional instructions   metoprolol succinate 100 MG 24 hr tablet Commonly known as:  TOPROL-XL Take 1 tablet (100 mg total) by mouth daily. Take with or immediately following a meal.   timolol 0.5 % ophthalmic solution Commonly known as:   BETIMOL Place 1 drop into both eyes 2 (two) times daily.   travoprost (benzalkonium) 0.004 % ophthalmic solution Commonly known as:  TRAVATAN Place 1 drop into both eyes at bedtime.      Follow-up Information    Renato Shin, MD. Schedule an appointment as soon as possible for a visit in 1 week(s).   Specialty:  Endocrinology Contact information: [address] [place] [address] [phone]        Hemodialysis center Follow up.   Why:  Keep regular hemodialysis appointments on Mondays, Wednesdays and Fridays.         No Known Allergies    Procedures/Studies: Dg Chest 2 View  Result Date: 03/28/2017 CLINICAL DATA:  52 y/o  M; shortness of breath. EXAM: CHEST  2 VIEW COMPARISON:  11/26/2016 chest radiograph FINDINGS: Stable cardiac silhouette given projection and technique. Reticular opacities of the lungs. No focal consolidation. No pleural effusion or pneumothorax identified. Bones are unremarkable. IMPRESSION: Reticular opacities of lungs probably representing interstitial pulmonary edema. Electronically Signed   By: Kristine Garbe M.D.   On: 03/28/2017 01:22   Ir Av Dialy Shunt Intro Needle/intracath Initial W/pta/img Left  Result Date: 03/30/2017 CLINICAL DATA:  End stage renal disease, left arm swelling. EXAM: DIALYSIS SHUNTOGRAM VENOUS ANGIOPLASTY FLUOROSCOPY TIME:  1.7 minutes; 144  uGym2 DAP COMPARISON:  None available TECHNIQUE: An 18-gauge angiocatheter was placed antegrade into the patient's left upper arm straightAV hemodialysis fistula just central to the arterial anastomosis for dialysis fistulography. The angiocatheter and surrounding skin were then prepped with Betadine, draped in usual sterile fashion, infiltrated locally with 1% lidocaine. Intravenous Fentanyl and Versed were administered as conscious sedation during continuous monitoring of the patient's level of consciousness and physiological / cardiorespiratory status by the  radiology RN, with a total moderate sedation time of 9 minutes. The angiocatheter was exchanged over a Benson wire for a 7 [place] vascular sheath, through which a 46mm x 4 cm atlas angioplasty balloon was advanced to the level of a moderately severe central left innominate veinstenosis for venous angioplasty using 60 second overlapping inflations at 18 atmospheres. After follow-up venography, the catheter, sheath, and guidewire were removed and hemostasis achieved with a 2-0 Ethilon purse-string suture. No immediate complication. FINDINGS: Induced reflux across the arterial anastomosis demonstrates wide patency. The left upper arm straight graft is patent. Venous anastomosis widely patent. Venous outflow through the subclavian vein widely patent. An occluded central cephalic vein stent is incidentally noted, although this is not part of the outflow of the shunt. There is a tapered moderately severe short-segment stenosis centrally in the innominate vein just proximal to its confluence with the right brachiocephalic vein and SVC. There is retrograde filling of left body  wall collateral venous channels, attesting to the hemodynamic significance of this lesion. With balloon angioplasty, there was some resistance at the site of the innominate stenosis initially but ultimately it responded well to 14 mm balloon angioplasty. Follow-up venography demonstrates wide patency of the left innominate vein, no dissection or extravasation, no further filling of body wall collaterals on venography. IMPRESSION: 1. Hemodynamically significant left innominate vein stenosis, with good response to 14 mm balloon angioplasty. ACCESS: Remains approachable for percutaneous intervention as needed. Electronically Signed   By: Lucrezia Europe M.D.   On: 03/30/2017 10:24      Subjective: Seen this morning prior to procedures. Reports that his left upper extremity pain and swelling are better. Denies dyspnea, chest pain, fever or chills. Has  minimal dry cough. Does not use home oxygen.  Discharge Exam:  Vitals:   03/30/17 0947 03/30/17 0951 03/30/17 0956 03/30/17 1251  BP: (!) 175/95 (!) 177/88 (!) 165/82   Pulse: 77 78 78   Resp: 17 18 12    Temp:      TempSrc:    (P) Oral  SpO2: 99% 100% 93% (P) 100%  Weight:    (P) 103 kg (227 lb 1.2 oz)  Height:        General: Young male, moderately built and obese, sitting up comfortably in bed. Cardiovascular: S1 & S2 heard, RRR, S1/S2 +. No murmurs, rubs, gallops or clicks. No JVD. Trace bilateral ankle edema. Telemetry: Sinus rhythm with BBB morphology. Respiratory: Occasional basal crackles but rest of lung fields clear to auscultation. No increased work of breathing. Abdominal:  Non distended, non tender & soft. No organomegaly or masses appreciated. Normal bowel sounds heard. CNS: Alert and oriented. No focal deficits. Extremities: Left upper extremity diffusely swollen but no increased warmth, redness or tenderness. Left upper arm thrill felt.    The results of significant diagnostics from this hospitalization (including imaging, microbiology, ancillary and laboratory) are listed below for reference.     Microbiology: Recent Results (from the past 240 hour(s))  MRSA PCR Screening     Status: Abnormal   Collection Time: 03/28/17  5:22 PM  Result Value Ref Range Status   MRSA by PCR POSITIVE (A) NEGATIVE Final    Comment:        The GeneXpert MRSA Assay (FDA approved for NASAL specimens only), is one component of a comprehensive MRSA colonization surveillance program. It is not intended to diagnose MRSA infection nor to guide or monitor treatment for MRSA infections. RESULT CALLED TO, READ BACK BY AND VERIFIED WITH: INGRAM,M RN [id number] 03/28/17 PITT,L      Labs: CBC: Recent Labs  Lab 03/28/17 0046 03/29/17 0327 03/30/17 1025  WBC 7.5 5.7 6.6  NEUTROABS  --   --  3.5  HGB 12.5* 12.9* 13.0  HCT 37.0* 39.8 39.6  MCV 92.7 94.3 93.8  PLT 165 156 147*   Basic  Metabolic Panel: Recent Labs  Lab 03/28/17 0046 03/28/17 0344 03/28/17 1040 03/29/17 0327 03/30/17 1025  NA 139 140 141 137 136  K 5.8* 5.5* 5.8* 4.4 5.5*  CL 101 102 101 96* 94*  CO2 18* 17* 18* 23 24  GLUCOSE 134* 130* 113* 165* 170*  BUN 130* 130* 131* 68* 82*  CREATININE 21.14* 21.33* 21.58* 14.41* 17.35*  CALCIUM 9.2 9.3 9.1 9.3 9.7  MG  --   --   --   --  2.8*  PHOS  --   --   --   --  11.3*  Liver Function Tests: Recent Labs  Lab 03/30/17 1025  AST 17  ALT 13*  ALKPHOS 131*  BILITOT 1.1  PROT 7.9  ALBUMIN 3.5   BNP (last 3 results) Recent Labs    08/04/16 1832  BNP 1,811.8*   Cardiac Enzymes: Recent Labs  Lab 03/28/17 0344 03/28/17 1040  TROPONINI 0.12* 0.12*   CBG: Recent Labs  Lab 03/29/17 0827 03/29/17 1215 03/29/17 1705 03/29/17 2116 03/30/17 1055  GLUCAP 141* 233* 131* 158* 154*       Time coordinating discharge: Over 30 minutes  SIGNED:  Vernell Leep, MD, FACP, [facility]. [facility] Pager [phone]  If 7PM-7AM, please contact night-coverage [email or web address] Password [email or web address] 03/30/2017, 3:01 PM

## 2017-03-30 NOTE — Sedation Documentation (Signed)
O2 2l/[place]  placed 

## 2017-03-30 NOTE — Discharge Instructions (Signed)

## 2017-03-30 NOTE — Progress Notes (Signed)
[facility] Progress Note   Subjective:   Seen in room, for HD later today. S/p f'gram this morning with innomonate vein angioplasty.  Objective Vitals:   03/30/17 0937 03/30/17 0947 03/30/17 0951 03/30/17 0956  BP: (!) 170/84 (!) 175/95 (!) 177/88 (!) 165/82  Pulse: 75 77 78 78  Resp: 12 17 18 12   Temp:      TempSrc:      SpO2: 96% 99% 100% 93%  Weight:      Height:       Physical Exam General: Well appearing, NAD Heart: RRR; no murmur Lungs: CTA, scattered crackles in bases Extremities: No LE edema, L arm edema Dialysis Access: LUE AVF + thrill  Additional Objective Labs: Basic Metabolic Panel: Recent Labs  Lab 03/28/17 0344 03/28/17 1040 03/29/17 0327  NA 140 141 137  K 5.5* 5.8* 4.4  CL 102 101 96*  CO2 17* 18* 23  GLUCOSE 130* 113* 165*  BUN 130* 131* 68*  CREATININE 21.33* 21.58* 14.41*  CALCIUM 9.3 9.1 9.3   Liver Function Tests: No results for input(s): AST, ALT, ALKPHOS, BILITOT, PROT, ALBUMIN in the last 168 hours. No results for input(s): LIPASE, AMYLASE in the last 168 hours. CBC: Recent Labs  Lab 03/28/17 0046 03/29/17 0327  WBC 7.5 5.7  HGB 12.5* 12.9*  HCT 37.0* 39.8  MCV 92.7 94.3  PLT 165 156   Cardiac Enzymes: Recent Labs  Lab 03/28/17 0344 03/28/17 1040  TROPONINI 0.12* 0.12*   CBG: Recent Labs  Lab 03/28/17 2127 03/29/17 0827 03/29/17 1215 03/29/17 1705 03/29/17 2116  GLUCAP 202* 141* 233* 131* 158*   Iron Studies: No results for input(s): IRON, TIBC, TRANSFERRIN, FERRITIN in the last 72 hours. @lablastinr3 @ Studies/Results: Ir Av Dialy Shunt Intro Needle/intracath Initial W/pta/img Left  Result Date: 03/30/2017 CLINICAL DATA:  End stage renal disease, left arm swelling. EXAM: DIALYSIS SHUNTOGRAM VENOUS ANGIOPLASTY FLUOROSCOPY TIME:  1.7 minutes; 144  uGym2 DAP COMPARISON:  None available TECHNIQUE: An 18-gauge angiocatheter was placed antegrade into the patient's left upper arm straightAV hemodialysis  fistula just central to the arterial anastomosis for dialysis fistulography. The angiocatheter and surrounding skin were then prepped with Betadine, draped in usual sterile fashion, infiltrated locally with 1% lidocaine. Intravenous Fentanyl and Versed were administered as conscious sedation during continuous monitoring of the patient's level of consciousness and physiological / cardiorespiratory status by the radiology RN, with a total moderate sedation time of 9 minutes. The angiocatheter was exchanged over a Benson wire for a 7 [place] vascular sheath, through which a 39mm x 4 cm atlas angioplasty balloon was advanced to the level of a moderately severe central left innominate veinstenosis for venous angioplasty using 60 second overlapping inflations at 18 atmospheres. After follow-up venography, the catheter, sheath, and guidewire were removed and hemostasis achieved with a 2-0 Ethilon purse-string suture. No immediate complication. FINDINGS: Induced reflux across the arterial anastomosis demonstrates wide patency. The left upper arm straight graft is patent. Venous anastomosis widely patent. Venous outflow through the subclavian vein widely patent. An occluded central cephalic vein stent is incidentally noted, although this is not part of the outflow of the shunt. There is a tapered moderately severe short-segment stenosis centrally in the innominate vein just proximal to its confluence with the right brachiocephalic vein and SVC. There is retrograde filling of left body wall collateral venous channels, attesting to the hemodynamic significance of this lesion. With balloon angioplasty, there was some resistance at the site of the innominate stenosis initially but  ultimately it responded well to 14 mm balloon angioplasty. Follow-up venography demonstrates wide patency of the left innominate vein, no dissection or extravasation, no further filling of body wall collaterals on venography. IMPRESSION: 1.  Hemodynamically significant left innominate vein stenosis, with good response to 14 mm balloon angioplasty. ACCESS: Remains approachable for percutaneous intervention as needed. Electronically Signed   By: Lucrezia Europe M.D.   On: 03/30/2017 10:24   Medications: . sodium chloride     . amLODipine  10 mg Oral Daily  . brimonidine  1 drop Both Eyes BID  . Chlorhexidine Gluconate Cloth  6 each Topical Q0600  . fentaNYL      . heparin injection (subcutaneous)  5,000 Units Subcutaneous Q8H  . insulin aspart  0-5 Units Subcutaneous QHS  . insulin aspart  0-9 Units Subcutaneous TID WC  . iopamidol      . iopamidol      . latanoprost  1 drop Both Eyes QHS  . lidocaine      . metoprolol succinate  100 mg Oral Daily  . midazolam      . mupirocin ointment  1 application Nasal BID  . sodium chloride flush  3 mL Intravenous Q12H  . sodium chloride flush  3 mL Intravenous Q12H  . timolol  1 drop Both Eyes BID    Dialysis Orders: MWF  4h 51min 99.5kg Heparin 9000 AVG LUA -hect 1  -sensipar 90 tiw at hd   Home meds: -norvasc 10/ toprol xl 100 qd -humalog insulin tid -eye drops -ecasa 81  Impression: 1. SOB / pulm edema - S/p urgent HD 1/7. Next HD planned for 1/9. AVG is not clotted on exam. Now s/p f'gram. Hep drip stopped. 2. Mild hyperkalemia: resolved with HD. 3. ESRD on HD MWF, next 1/9. 4. HTN - cont meds 5. DM - per primary 6. Dispo - ok for dc from renal standpoint    Veneta Penton, PA-C 03/30/2017, 10:41 AM  [facility] Pager: [phone]  Pt seen, examined and agree w A/P as above.  Kelly Splinter MD [employer] pager [phone]   03/30/2017, 12:24 PM

## 2017-03-30 NOTE — Sedation Documentation (Signed)
Transport here, back to [place], tolerated angioplasty well.

## 2017-03-30 NOTE — Sedation Documentation (Signed)
O2 d/c'd 

## 2017-04-01 DIAGNOSIS — D509 Iron deficiency anemia, unspecified: Secondary | ICD-10-CM | POA: Diagnosis not present

## 2017-04-01 DIAGNOSIS — N2581 Secondary hyperparathyroidism of renal origin: Secondary | ICD-10-CM | POA: Diagnosis not present

## 2017-04-01 DIAGNOSIS — N186 End stage renal disease: Secondary | ICD-10-CM | POA: Diagnosis not present

## 2017-04-04 DIAGNOSIS — N2581 Secondary hyperparathyroidism of renal origin: Secondary | ICD-10-CM | POA: Diagnosis not present

## 2017-04-04 DIAGNOSIS — N186 End stage renal disease: Secondary | ICD-10-CM | POA: Diagnosis not present

## 2017-04-04 DIAGNOSIS — D509 Iron deficiency anemia, unspecified: Secondary | ICD-10-CM | POA: Diagnosis not present

## 2017-04-06 DIAGNOSIS — N186 End stage renal disease: Secondary | ICD-10-CM | POA: Diagnosis not present

## 2017-04-06 DIAGNOSIS — N2581 Secondary hyperparathyroidism of renal origin: Secondary | ICD-10-CM | POA: Diagnosis not present

## 2017-04-06 DIAGNOSIS — D509 Iron deficiency anemia, unspecified: Secondary | ICD-10-CM | POA: Diagnosis not present

## 2017-04-08 DIAGNOSIS — N2581 Secondary hyperparathyroidism of renal origin: Secondary | ICD-10-CM | POA: Diagnosis not present

## 2017-04-08 DIAGNOSIS — N186 End stage renal disease: Secondary | ICD-10-CM | POA: Diagnosis not present

## 2017-04-08 DIAGNOSIS — D509 Iron deficiency anemia, unspecified: Secondary | ICD-10-CM | POA: Diagnosis not present

## 2017-04-11 DIAGNOSIS — N2581 Secondary hyperparathyroidism of renal origin: Secondary | ICD-10-CM | POA: Diagnosis not present

## 2017-04-11 DIAGNOSIS — D509 Iron deficiency anemia, unspecified: Secondary | ICD-10-CM | POA: Diagnosis not present

## 2017-04-11 DIAGNOSIS — N186 End stage renal disease: Secondary | ICD-10-CM | POA: Diagnosis not present

## 2017-04-13 DIAGNOSIS — N186 End stage renal disease: Secondary | ICD-10-CM | POA: Diagnosis not present

## 2017-04-13 DIAGNOSIS — E1129 Type 2 diabetes mellitus with other diabetic kidney complication: Secondary | ICD-10-CM | POA: Diagnosis not present

## 2017-04-13 DIAGNOSIS — D509 Iron deficiency anemia, unspecified: Secondary | ICD-10-CM | POA: Diagnosis not present

## 2017-04-13 DIAGNOSIS — N2581 Secondary hyperparathyroidism of renal origin: Secondary | ICD-10-CM | POA: Diagnosis not present

## 2017-04-15 DIAGNOSIS — N186 End stage renal disease: Secondary | ICD-10-CM | POA: Diagnosis not present

## 2017-04-15 DIAGNOSIS — D509 Iron deficiency anemia, unspecified: Secondary | ICD-10-CM | POA: Diagnosis not present

## 2017-04-15 DIAGNOSIS — N2581 Secondary hyperparathyroidism of renal origin: Secondary | ICD-10-CM | POA: Diagnosis not present

## 2017-04-18 DIAGNOSIS — N186 End stage renal disease: Secondary | ICD-10-CM | POA: Diagnosis not present

## 2017-04-18 DIAGNOSIS — D509 Iron deficiency anemia, unspecified: Secondary | ICD-10-CM | POA: Diagnosis not present

## 2017-04-18 DIAGNOSIS — N2581 Secondary hyperparathyroidism of renal origin: Secondary | ICD-10-CM | POA: Diagnosis not present

## 2017-04-20 DIAGNOSIS — N2581 Secondary hyperparathyroidism of renal origin: Secondary | ICD-10-CM | POA: Diagnosis not present

## 2017-04-20 DIAGNOSIS — D509 Iron deficiency anemia, unspecified: Secondary | ICD-10-CM | POA: Diagnosis not present

## 2017-04-20 DIAGNOSIS — N186 End stage renal disease: Secondary | ICD-10-CM | POA: Diagnosis not present

## 2017-04-21 DIAGNOSIS — Z992 Dependence on renal dialysis: Secondary | ICD-10-CM | POA: Diagnosis not present

## 2017-04-21 DIAGNOSIS — E1129 Type 2 diabetes mellitus with other diabetic kidney complication: Secondary | ICD-10-CM | POA: Diagnosis not present

## 2017-04-21 DIAGNOSIS — N186 End stage renal disease: Secondary | ICD-10-CM | POA: Diagnosis not present

## 2017-04-22 DIAGNOSIS — N186 End stage renal disease: Secondary | ICD-10-CM | POA: Diagnosis not present

## 2017-04-22 DIAGNOSIS — E1129 Type 2 diabetes mellitus with other diabetic kidney complication: Secondary | ICD-10-CM | POA: Diagnosis not present

## 2017-04-22 DIAGNOSIS — Z992 Dependence on renal dialysis: Secondary | ICD-10-CM | POA: Diagnosis not present

## 2017-04-25 DIAGNOSIS — E1129 Type 2 diabetes mellitus with other diabetic kidney complication: Secondary | ICD-10-CM | POA: Diagnosis not present

## 2017-04-25 DIAGNOSIS — N2581 Secondary hyperparathyroidism of renal origin: Secondary | ICD-10-CM | POA: Diagnosis not present

## 2017-04-25 DIAGNOSIS — N186 End stage renal disease: Secondary | ICD-10-CM | POA: Diagnosis not present

## 2017-04-27 DIAGNOSIS — E1129 Type 2 diabetes mellitus with other diabetic kidney complication: Secondary | ICD-10-CM | POA: Diagnosis not present

## 2017-04-27 DIAGNOSIS — N2581 Secondary hyperparathyroidism of renal origin: Secondary | ICD-10-CM | POA: Diagnosis not present

## 2017-04-27 DIAGNOSIS — N186 End stage renal disease: Secondary | ICD-10-CM | POA: Diagnosis not present

## 2017-04-29 DIAGNOSIS — N186 End stage renal disease: Secondary | ICD-10-CM | POA: Diagnosis not present

## 2017-04-29 DIAGNOSIS — N2581 Secondary hyperparathyroidism of renal origin: Secondary | ICD-10-CM | POA: Diagnosis not present

## 2017-04-29 DIAGNOSIS — E1129 Type 2 diabetes mellitus with other diabetic kidney complication: Secondary | ICD-10-CM | POA: Diagnosis not present

## 2017-05-02 ENCOUNTER — Ambulatory Visit: Payer: Medicare Other | Admitting: Endocrinology

## 2017-05-02 DIAGNOSIS — E1129 Type 2 diabetes mellitus with other diabetic kidney complication: Secondary | ICD-10-CM | POA: Diagnosis not present

## 2017-05-02 DIAGNOSIS — Z0289 Encounter for other administrative examinations: Secondary | ICD-10-CM

## 2017-05-02 DIAGNOSIS — N186 End stage renal disease: Secondary | ICD-10-CM | POA: Diagnosis not present

## 2017-05-02 DIAGNOSIS — N2581 Secondary hyperparathyroidism of renal origin: Secondary | ICD-10-CM | POA: Diagnosis not present

## 2017-05-04 DIAGNOSIS — N2581 Secondary hyperparathyroidism of renal origin: Secondary | ICD-10-CM | POA: Diagnosis not present

## 2017-05-04 DIAGNOSIS — N186 End stage renal disease: Secondary | ICD-10-CM | POA: Diagnosis not present

## 2017-05-04 DIAGNOSIS — E1129 Type 2 diabetes mellitus with other diabetic kidney complication: Secondary | ICD-10-CM | POA: Diagnosis not present

## 2017-05-06 DIAGNOSIS — N2581 Secondary hyperparathyroidism of renal origin: Secondary | ICD-10-CM | POA: Diagnosis not present

## 2017-05-06 DIAGNOSIS — E1129 Type 2 diabetes mellitus with other diabetic kidney complication: Secondary | ICD-10-CM | POA: Diagnosis not present

## 2017-05-06 DIAGNOSIS — N186 End stage renal disease: Secondary | ICD-10-CM | POA: Diagnosis not present

## 2017-05-07 ENCOUNTER — Encounter: Payer: Self-pay | Admitting: Physician Assistant

## 2017-05-07 ENCOUNTER — Ambulatory Visit (INDEPENDENT_AMBULATORY_CARE_PROVIDER_SITE_OTHER): Payer: Medicare Other | Admitting: Physician Assistant

## 2017-05-07 VITALS — BP 127/81 | HR 98 | Temp 97.6°F | Resp 18 | Ht 72.0 in | Wt 219.0 lb

## 2017-05-07 DIAGNOSIS — R42 Dizziness and giddiness: Secondary | ICD-10-CM

## 2017-05-07 DIAGNOSIS — N644 Mastodynia: Secondary | ICD-10-CM | POA: Diagnosis not present

## 2017-05-07 DIAGNOSIS — I509 Heart failure, unspecified: Secondary | ICD-10-CM | POA: Diagnosis not present

## 2017-05-07 DIAGNOSIS — N186 End stage renal disease: Secondary | ICD-10-CM

## 2017-05-07 DIAGNOSIS — H409 Unspecified glaucoma: Secondary | ICD-10-CM | POA: Diagnosis not present

## 2017-05-07 DIAGNOSIS — W19XXXA Unspecified fall, initial encounter: Secondary | ICD-10-CM | POA: Diagnosis not present

## 2017-05-07 DIAGNOSIS — R202 Paresthesia of skin: Secondary | ICD-10-CM

## 2017-05-07 DIAGNOSIS — E875 Hyperkalemia: Secondary | ICD-10-CM

## 2017-05-07 DIAGNOSIS — E118 Type 2 diabetes mellitus with unspecified complications: Secondary | ICD-10-CM | POA: Diagnosis not present

## 2017-05-07 DIAGNOSIS — Z992 Dependence on renal dialysis: Secondary | ICD-10-CM | POA: Diagnosis not present

## 2017-05-07 DIAGNOSIS — R55 Syncope and collapse: Secondary | ICD-10-CM | POA: Diagnosis not present

## 2017-05-07 MED ORDER — TIMOLOL HEMIHYDRATE 0.5 % OP SOLN: 1.0000 [drp] | Freq: Two times a day (BID) | OPHTHALMIC | 1 refills | Status: AC

## 2017-05-07 MED ORDER — DOXYCYCLINE HYCLATE 100 MG PO CAPS: 100.0000 mg | ORAL_CAPSULE | Freq: Two times a day (BID) | ORAL | 0 refills | Status: DC

## 2017-05-07 MED ORDER — PREGABALIN 50 MG PO CAPS: 50.0000 mg | ORAL_CAPSULE | Freq: Every day | ORAL | 1 refills | Status: DC

## 2017-05-07 NOTE — Progress Notes (Signed)
PRIMARY CARE AT [facility] [address] [place] [address] [phone]  Date:  05/07/2017   Name:  Howard Bell   DOB:  08-18-1965   MRN:  [id number]  PCP:  Patient, No Pcp Per    History of Present Illness:  Howard Bell is a 52 y.o. male patient who presents to PCP with  Chief Complaint  Patient presents with  . Annual Exam    Unable to void (Medical Reason?)  . Cyst    Upper Left thigh  . Breast Pain    Right Nipple  . Insomnia    "Twitching" and Cough at night  . Depression    See screening     He has had several falls.  Walks like he is drunk.  Other providers have deemed it secondary to his cough.  THis coughing has been for the last 6 months.  He notes a dry spell and then the coughing.  This may last 1-2 hours.  Cough drops may relieve.  He notes that he does have some acid reflux, where he we will throw up.  Sour taste in mouth.  No abdominal pain.   He is coughing at night.  He is not sleeping well at night.    Sore right nipple.  1 week.  No swelling or weirdness.  No drainage or odd bump.  No discharge.  Pinching type of pain.    Concerning of bumps on leg left 4-5 years.  Some pain at times.  But at times, he does notice them there.  They have not grown.  No drainage, no redness.    mwf dialysis with [facility] for ESRD.  He has no neprhologist at this time, he reports.    Patient Active Problem List   Diagnosis Date Noted  . Hyperkalemia 03/28/2017  . Left arm swelling 03/28/2017  . ESRD (end stage renal disease) ([place]) 03/28/2017  . Hypertensive urgency   . Chest pain 11/26/2016  . Essential hypertension 11/26/2016  . ESRD needing dialysis ([place]) 08/04/2016  . Acute respiratory failure with hypoxemia ([place]) 08/04/2016  . Diabetes mellitus with complication ([place]) 62/26/3335  . Acute CHF (congestive heart failure) ([place]) 08/04/2016  . Hypertensive emergency 08/04/2016  . Elevated troponin     Past Medical History:  Diagnosis Date  . Congestive  heart failure (CHF) ([place])   . 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 ESRD (end stage renal disease) on dialysis ([place])   . Hypertension     Past Surgical History:  Procedure Laterality Date  . HERNIA REPAIR    . IR AV DIALY SHUNT INTRO NEEDLE/INTRACATH INITIAL W/PTA/IMG LEFT  03/30/2017    Social History   Tobacco Use  . Smoking status: Never Smoker  . Smokeless tobacco: Never Used  Substance Use Topics  . Alcohol use: No  . Drug use: No    Family History  Problem Relation Age of Onset  . Diabetes Mother   . Hypertension Father     No Known Allergies  Medication list has been reviewed and updated.  Current Outpatient Medications on File Prior to Visit  Medication Sig Dispense Refill  . amLODipine (NORVASC) 10 MG tablet Take 1 tablet (10 mg total) by mouth daily. 30 tablet 0  . brimonidine (ALPHAGAN) 0.2 % ophthalmic solution Place 1 drop into both eyes 2 (two) times daily.    . insulin lispro (HUMALOG KWIKPEN) 100 UNIT/ML KiwkPen Inject 0-0.09 mLs (0-9 Units total) into the skin 3 (three)  times daily with meals. CBG < 70: Eat or drink something sweet and recheck. CBG 70 - 120: 0 units CBG 121 - 150: 1 unit CBG 151 - 200: 2 units CBG 201 - 250: 3 units CBG 251 - 300: 5 units CBG 301 - 350: 7 units CBG 351 - 400: 9 units CBG > 400: call MD.    . metoprolol succinate (TOPROL-XL) 100 MG 24 hr tablet Take 1 tablet (100 mg total) by mouth daily. Take with or immediately following a meal. 30 tablet 0  . timolol (BETIMOL) 0.5 % ophthalmic solution Place 1 drop into both eyes 2 (two) times daily.    . travoprost, benzalkonium, (TRAVATAN) 0.004 % ophthalmic solution Place 1 drop into both eyes at bedtime.    . diclofenac sodium (VOLTAREN) 1 % GEL Apply 4 g topically 4 (four) times daily as needed (pain). (Patient not taking: Reported on 05/07/2017)     No current facility-administered medications on file prior to visit.     ROS ROS otherwise  unremarkable unless listed above.  Physical Examination: BP 127/81   Pulse 98   Temp 97.6 F (36.4 C) (Oral)   Resp 18   Ht 6' (1.829 m)   Wt 219 lb (99.3 kg)   SpO2 93%   BMI 29.70 kg/m  Ideal Body Weight: Weight in (lb) to have BMI = 25: 183.9  Physical Exam  Constitutional: He is oriented to person, place, and time. He appears well-developed and well-nourished. No distress.  HENT:  Head: Normocephalic and atraumatic.  Eyes: Conjunctivae and EOM are normal. Pupils are equal, round, and reactive to light.  Cardiovascular: Normal rate, regular rhythm and normal heart sounds.  No murmur heard. Pulmonary/Chest: Effort normal. No respiratory distress. He has no wheezes.  Right nipple with increased indentation however not drastically different.  No erythema or swelling.   Neurological: He is alert and oriented to person, place, and time.  Skin: Skin is warm and dry. He is not diaphoretic.  Subcutaneous nodularity without erythema.  Psychiatric: He has a normal mood and affect. His behavior is normal.     Assessment and Plan: Howard Bell is a 52 y.o. male who is here today for cc of  Chief Complaint  Patient presents with  . Annual Exam    Unable to void (Medical Reason?)  . Cyst    Upper Left thigh  . Breast Pain    Right Nipple  . Insomnia    "Twitching" and Cough at night  . Depression    See screening   1. Glaucoma of both eyes, unspecified glaucoma type Refilling this at this time.   - timolol (BETIMOL) 0.5 % ophthalmic solution; Place 1 drop into both eyes 2 (two) times daily.  Dispense: 10 mL; Refill: 1 - Basic metabolic panel - Brain natriuretic peptide  2. ESRD on dialysis [facility]) Referring to ne - Basic metabolic panel - Brain natriuretic peptide - Ambulatory referral to Endocrinology  3. Hyperkalemia - Basic metabolic panel - Brain natriuretic peptide  4. Chronic congestive heart failure, unspecified heart failure type (HCC) - Brain  natriuretic peptide  5. Postural dizziness with presyncope - EKG 12-Lead - Ambulatory referral to Neurology  6. Fall, initial encounter Unknown etiology with multiple factors, electrolytes, orthostatic, paresthesia - Ambulatory referral to Cardiology - Ambulatory referral to Endocrinology - Ambulatory referral to Neurology  7. Diabetes mellitus with complication [facility]) Referring to endocrinology for improved glycemic control. - Ambulatory referral to Endocrinology  8. Breast pain, right  Ultrasound ordered.  Will start doxycycline to treat for possible bacterial infection - doxycycline (VIBRAMYCIN) 100 MG capsule; Take 1 capsule (100 mg total) by mouth 2 (two) times daily.  Dispense: 20 capsule; Refill: 0 - US BREAST COMPLETE UNI RIGHT INC AXILLA; Future  9. Paresthesia Refilling lyrica. - pregabalin (LYRICA) 50 MG capsule; Take 1 capsule (50 mg total) by mouth daily.  Dispense: 90 capsule; Refill: 1  Ivar Drape, PA-C [facility] 2/26/20191:52 PM

## 2017-05-07 NOTE — Patient Instructions (Addendum)
I am referring you to the individuals below.  Please await this contact and follow through.   I would like you to take the medications as prescribed. I have filled the pregabalin and the timolol.  I need you to pick up the blood pressure medications as well.        IF you received an x-ray today, you will receive an invoice from [facility]. Please contact [facility] at [phone] with questions or concerns regarding your invoice.   IF you received labwork today, you will receive an invoice from [facility]. Please contact [facility] at [phone] with questions or concerns regarding your invoice.   Our billing staff will not be able to assist you with questions regarding bills from these companies.  You will be contacted with the lab results as soon as they are available. The fastest way to get your results is to activate your My Chart account. Instructions are located on the last page of this paperwork. If you have not heard from [place] regarding the results in 2 weeks, please contact this office.

## 2017-05-07 NOTE — Progress Notes (Deleted)
Subjective:    Howard Bell is a 52 y.o. male who presents for Medicare Annual/Subsequent preventive examination.   Preventive Screening-Counseling & Management  Tobacco Social History   Tobacco Use  Smoking Status Never Smoker  Smokeless Tobacco Never Used    Problems Prior to Visit 1.   Current Problems (verified) Patient Active Problem List   Diagnosis Date Noted  . Hyperkalemia 03/28/2017  . Left arm swelling 03/28/2017  . ESRD (end stage renal disease) ([place]) 03/28/2017  . Hypertensive urgency   . Chest pain 11/26/2016  . Essential hypertension 11/26/2016  . ESRD needing dialysis ([place]) 08/04/2016  . Acute respiratory failure with hypoxemia ([place]) 08/04/2016  . Diabetes mellitus with complication ([place]) 50/93/2671  . Acute CHF (congestive heart failure) ([place]) 08/04/2016  . Hypertensive emergency 08/04/2016  . Elevated troponin     Medications Prior to Visit Current Outpatient Medications on File Prior to Visit  Medication Sig Dispense Refill  . amLODipine (NORVASC) 10 MG tablet Take 1 tablet (10 mg total) by mouth daily. 30 tablet 0  . brimonidine (ALPHAGAN) 0.2 % ophthalmic solution Place 1 drop into both eyes 2 (two) times daily.    . diclofenac sodium (VOLTAREN) 1 % GEL Apply 4 g topically 4 (four) times daily as needed (pain).    . insulin lispro (HUMALOG KWIKPEN) 100 UNIT/ML KiwkPen Inject 0-0.09 mLs (0-9 Units total) into the skin 3 (three) times daily with meals. CBG < 70: Eat or drink something sweet and recheck. CBG 70 - 120: 0 units CBG 121 - 150: 1 unit CBG 151 - 200: 2 units CBG 201 - 250: 3 units CBG 251 - 300: 5 units CBG 301 - 350: 7 units CBG 351 - 400: 9 units CBG > 400: call MD.    . metoprolol succinate (TOPROL-XL) 100 MG 24 hr tablet Take 1 tablet (100 mg total) by mouth daily. Take with or immediately following a meal. 30 tablet 0  . timolol (BETIMOL) 0.5 % ophthalmic solution Place 1 drop into both eyes 2 (two) times daily.    .  travoprost, benzalkonium, (TRAVATAN) 0.004 % ophthalmic solution Place 1 drop into both eyes at bedtime.     No current facility-administered medications on file prior to visit.     Current Medications (verified) Current Outpatient Medications  Medication Sig Dispense Refill  . amLODipine (NORVASC) 10 MG tablet Take 1 tablet (10 mg total) by mouth daily. 30 tablet 0  . brimonidine (ALPHAGAN) 0.2 % ophthalmic solution Place 1 drop into both eyes 2 (two) times daily.    . diclofenac sodium (VOLTAREN) 1 % GEL Apply 4 g topically 4 (four) times daily as needed (pain).    . insulin lispro (HUMALOG KWIKPEN) 100 UNIT/ML KiwkPen Inject 0-0.09 mLs (0-9 Units total) into the skin 3 (three) times daily with meals. CBG < 70: Eat or drink something sweet and recheck. CBG 70 - 120: 0 units CBG 121 - 150: 1 unit CBG 151 - 200: 2 units CBG 201 - 250: 3 units CBG 251 - 300: 5 units CBG 301 - 350: 7 units CBG 351 - 400: 9 units CBG > 400: call MD.    . metoprolol succinate (TOPROL-XL) 100 MG 24 hr tablet Take 1 tablet (100 mg total) by mouth daily. Take with or immediately following a meal. 30 tablet 0  . timolol (BETIMOL) 0.5 % ophthalmic solution Place 1 drop into both eyes 2 (two) times daily.    . travoprost, benzalkonium, (TRAVATAN) 0.004 % ophthalmic solution  Place 1 drop into both eyes at bedtime.     No current facility-administered medications for this visit.      Allergies (verified) Patient has no known allergies.   PAST HISTORY  Family History Family History  Problem Relation Age of Onset  . Diabetes Mother   . Hypertension Father     Social History Social History   Tobacco Use  . Smoking status: Never Smoker  . Smokeless tobacco: Never Used  Substance Use Topics  . Alcohol use: No    Are there smokers in your home (other than you)?  {yes/no:20286}  Risk Factors Current exercise habits: {exercise:19826}  Dietary issues discussed: ***   Cardiac risk factors: {risk  factors:510}.  Depression Screen (Note: if answer to either of the following is "Yes", a more complete depression screening is indicated)   Q1: Over the past two weeks, have you felt down, depressed or hopeless? {yes/no:20286}  Q2: Over the past two weeks, have you felt little interest or pleasure in doing things? {yes/no:20286}  Have you lost interest or pleasure in daily life? {yes/no:20286}  Do you often feel hopeless? {yes/no:20286}  Do you cry easily over simple problems? {yes/no:20286}  Activities of Daily Living In your present state of health, do you have any difficulty performing the following activities?:  Driving? {yes/no:20286} Managing money?  {Responses; yes/no (default no):140031::"No"} Feeding yourself? {yes/no:20286} Getting from bed to chair? {yes/no:20286} Climbing a flight of stairs? {yes/no:20286} Preparing food and eating?: {yes/no (default no):140031::"No"} Bathing or showering? {Responses; yes/no (default no):140031::"No"} Getting dressed: {yes/no (default no):140031::"No"} Getting to the toilet? {Responses; yes/no (default no):140031::"No"} Using the toilet:{yes/no (default no):140031::"No"} Moving around from place to place: {yes/no (default no):140031::"No"} In the past year have you fallen or had a near fall?:{yes/no (default no):140031::"No"}   Are you sexually active?  {yes/no:20286}  Do you have more than one partner?  {Responses; yes/no (default no):140031::"No"}  Hearing Difficulties: {yes/no (default no):140031::"No"} Do you often ask people to speak up or repeat themselves? {Responses; yes/no (default no):140031::"No"} Do you experience ringing or noises in your ears? {Responses; yes/no (default no):140031::"No"} Do you have difficulty understanding soft or whispered voices? {Responses; yes/no (default no):140031::"No"}   Do you feel that you have a problem with memory? {yes/no:20286}  Do you often misplace items? {yes/no:20286}  Do you feel safe  at home?  {yes/no:20286}  Cognitive Testing  Alert? {yes/no:20286}  Normal Appearance?{yes/no:20286}  Oriented to person? {yes/no:20286}  Place? {yes/no:20286}   Time? {yes/no:20286}  Recall of three objects?  {yes/no:20286}  Can perform simple calculations? {yes/no:20286}  Displays appropriate judgment?{yes/no:20286}  Can read the correct time from a watch face?{yes/no:20286}   Advanced Directives have been discussed with the patient? {yes/no:20286}   List the Names of Other Physician/Practitioners you currently use: 1.    Indicate any recent Medical Services you may have received from other than [facility] providers in the past year (date may be approximate).   There is no immunization history on file for this patient.  Screening Tests Health Maintenance  Topic Date Due  . PNEUMOCOCCAL POLYSACCHARIDE VACCINE (1) 06/15/1969  . FOOT EXAM  06/15/1977  . OPHTHALMOLOGY EXAM  06/15/1977  . URINE MICROALBUMIN  06/15/1977  . TETANUS/TDAP  06/16/1986  . INFLUENZA VACCINE  10/20/2016  . HEMOGLOBIN A1C  07/20/2017  . HIV Screening  Completed    All answers were reviewed with the patient and necessary referrals were made:  Ivar Drape, PA   05/07/2017   History reviewed: {history reviewed:20406::"allergies","current medications","past family history","past medical history","past social  history","past surgical history","problem list"}  Review of Systems {ros; complete:30496}    Objective:     Vision by Snellen chart: right eye:{vision:19455::"20/20"}, left eye:{vision:19455::"20/20"} Blood pressure 127/81, pulse 98, temperature 97.6 F (36.4 C), temperature source Oral, resp. rate 18, height 6' (1.829 m), weight 219 lb (99.3 kg), SpO2 93 %. Body mass index is 29.7 kg/m.  {Exam, PFXT:02409}     Assessment:     ***     Plan:     During the course of the visit the patient was educated and counseled about appropriate screening and preventive services including:     {plan:19837}  Diet review for nutrition referral? Yes ____  Not Indicated ____   Patient Instructions (the written plan) was given to the patient.  Medicare Attestation I have personally reviewed: The patient's medical and social history Their use of alcohol, tobacco or illicit drugs Their current medications and supplements The patient's functional ability including ADLs,fall risks, home safety risks, cognitive, and hearing and visual impairment Diet and physical activities Evidence for depression or mood disorders  The patient's weight, height, BMI, and visual acuity have been recorded in the chart.  I have made referrals, counseling, and provided education to the patient based on review of the above and I have provided the patient with a written personalized care plan for preventive services.     [place], [place]   05/07/2017

## 2017-05-09 DIAGNOSIS — E1129 Type 2 diabetes mellitus with other diabetic kidney complication: Secondary | ICD-10-CM | POA: Diagnosis not present

## 2017-05-09 DIAGNOSIS — N2581 Secondary hyperparathyroidism of renal origin: Secondary | ICD-10-CM | POA: Diagnosis not present

## 2017-05-09 DIAGNOSIS — N186 End stage renal disease: Secondary | ICD-10-CM | POA: Diagnosis not present

## 2017-05-10 LAB — BASIC METABOLIC PANEL
BUN/Creatinine Ratio: 4 — ABNORMAL LOW (ref 9–20)
BUN: 37 mg/dL — ABNORMAL HIGH (ref 6–24)
CO2: 24 mmol/L (ref 20–29)
Calcium: 8.6 mg/dL — ABNORMAL LOW (ref 8.7–10.2)
Chloride: 93 mmol/L — ABNORMAL LOW (ref 96–106)
Creatinine, Ser: 9.73 mg/dL (ref 0.76–1.27)
GFR calc Af Amer: 6 mL/min/{1.73_m2} — ABNORMAL LOW (ref >59)
GFR calc non Af Amer: 6 mL/min/{1.73_m2} — ABNORMAL LOW (ref >59)
Glucose: 202 mg/dL — ABNORMAL HIGH (ref 65–99)
Potassium: 4.1 mmol/L (ref 3.5–5.2)
Sodium: 141 mmol/L (ref 134–144)

## 2017-05-10 LAB — BRAIN NATRIURETIC PEPTIDE: BNP: 97.5 pg/mL (ref 0.0–100.0)

## 2017-05-11 DIAGNOSIS — N2581 Secondary hyperparathyroidism of renal origin: Secondary | ICD-10-CM | POA: Diagnosis not present

## 2017-05-11 DIAGNOSIS — N186 End stage renal disease: Secondary | ICD-10-CM | POA: Diagnosis not present

## 2017-05-11 DIAGNOSIS — E1129 Type 2 diabetes mellitus with other diabetic kidney complication: Secondary | ICD-10-CM | POA: Diagnosis not present

## 2017-05-13 DIAGNOSIS — N186 End stage renal disease: Secondary | ICD-10-CM | POA: Diagnosis not present

## 2017-05-13 DIAGNOSIS — E1129 Type 2 diabetes mellitus with other diabetic kidney complication: Secondary | ICD-10-CM | POA: Diagnosis not present

## 2017-05-13 DIAGNOSIS — N2581 Secondary hyperparathyroidism of renal origin: Secondary | ICD-10-CM | POA: Diagnosis not present

## 2017-05-16 DIAGNOSIS — N186 End stage renal disease: Secondary | ICD-10-CM | POA: Diagnosis not present

## 2017-05-16 DIAGNOSIS — N2581 Secondary hyperparathyroidism of renal origin: Secondary | ICD-10-CM | POA: Diagnosis not present

## 2017-05-16 DIAGNOSIS — E1129 Type 2 diabetes mellitus with other diabetic kidney complication: Secondary | ICD-10-CM | POA: Diagnosis not present

## 2017-05-18 DIAGNOSIS — N2581 Secondary hyperparathyroidism of renal origin: Secondary | ICD-10-CM | POA: Diagnosis not present

## 2017-05-18 DIAGNOSIS — E1129 Type 2 diabetes mellitus with other diabetic kidney complication: Secondary | ICD-10-CM | POA: Diagnosis not present

## 2017-05-18 DIAGNOSIS — N186 End stage renal disease: Secondary | ICD-10-CM | POA: Diagnosis not present

## 2017-05-19 ENCOUNTER — Other Ambulatory Visit: Payer: Self-pay | Admitting: Physician Assistant

## 2017-05-19 DIAGNOSIS — N644 Mastodynia: Secondary | ICD-10-CM

## 2017-05-20 DIAGNOSIS — N186 End stage renal disease: Secondary | ICD-10-CM | POA: Diagnosis not present

## 2017-05-20 DIAGNOSIS — E1129 Type 2 diabetes mellitus with other diabetic kidney complication: Secondary | ICD-10-CM | POA: Diagnosis not present

## 2017-05-20 DIAGNOSIS — Z992 Dependence on renal dialysis: Secondary | ICD-10-CM | POA: Diagnosis not present

## 2017-05-23 DIAGNOSIS — N186 End stage renal disease: Secondary | ICD-10-CM | POA: Diagnosis not present

## 2017-05-23 DIAGNOSIS — E1129 Type 2 diabetes mellitus with other diabetic kidney complication: Secondary | ICD-10-CM | POA: Diagnosis not present

## 2017-05-23 DIAGNOSIS — N2581 Secondary hyperparathyroidism of renal origin: Secondary | ICD-10-CM | POA: Diagnosis not present

## 2017-05-25 DIAGNOSIS — E1129 Type 2 diabetes mellitus with other diabetic kidney complication: Secondary | ICD-10-CM | POA: Diagnosis not present

## 2017-05-25 DIAGNOSIS — N186 End stage renal disease: Secondary | ICD-10-CM | POA: Diagnosis not present

## 2017-05-25 DIAGNOSIS — N2581 Secondary hyperparathyroidism of renal origin: Secondary | ICD-10-CM | POA: Diagnosis not present

## 2017-05-27 DIAGNOSIS — N186 End stage renal disease: Secondary | ICD-10-CM | POA: Diagnosis not present

## 2017-05-27 DIAGNOSIS — N2581 Secondary hyperparathyroidism of renal origin: Secondary | ICD-10-CM | POA: Diagnosis not present

## 2017-05-27 DIAGNOSIS — E1129 Type 2 diabetes mellitus with other diabetic kidney complication: Secondary | ICD-10-CM | POA: Diagnosis not present

## 2017-05-30 DIAGNOSIS — E1129 Type 2 diabetes mellitus with other diabetic kidney complication: Secondary | ICD-10-CM | POA: Diagnosis not present

## 2017-05-30 DIAGNOSIS — N186 End stage renal disease: Secondary | ICD-10-CM | POA: Diagnosis not present

## 2017-05-30 DIAGNOSIS — N2581 Secondary hyperparathyroidism of renal origin: Secondary | ICD-10-CM | POA: Diagnosis not present

## 2017-06-01 DIAGNOSIS — N186 End stage renal disease: Secondary | ICD-10-CM | POA: Diagnosis not present

## 2017-06-01 DIAGNOSIS — E1129 Type 2 diabetes mellitus with other diabetic kidney complication: Secondary | ICD-10-CM | POA: Diagnosis not present

## 2017-06-01 DIAGNOSIS — N2581 Secondary hyperparathyroidism of renal origin: Secondary | ICD-10-CM | POA: Diagnosis not present

## 2017-06-03 DIAGNOSIS — E1129 Type 2 diabetes mellitus with other diabetic kidney complication: Secondary | ICD-10-CM | POA: Diagnosis not present

## 2017-06-03 DIAGNOSIS — N186 End stage renal disease: Secondary | ICD-10-CM | POA: Diagnosis not present

## 2017-06-03 DIAGNOSIS — N2581 Secondary hyperparathyroidism of renal origin: Secondary | ICD-10-CM | POA: Diagnosis not present

## 2017-06-06 DIAGNOSIS — N2581 Secondary hyperparathyroidism of renal origin: Secondary | ICD-10-CM | POA: Diagnosis not present

## 2017-06-06 DIAGNOSIS — E1129 Type 2 diabetes mellitus with other diabetic kidney complication: Secondary | ICD-10-CM | POA: Diagnosis not present

## 2017-06-06 DIAGNOSIS — N186 End stage renal disease: Secondary | ICD-10-CM | POA: Diagnosis not present

## 2017-06-08 DIAGNOSIS — E1129 Type 2 diabetes mellitus with other diabetic kidney complication: Secondary | ICD-10-CM | POA: Diagnosis not present

## 2017-06-08 DIAGNOSIS — N2581 Secondary hyperparathyroidism of renal origin: Secondary | ICD-10-CM | POA: Diagnosis not present

## 2017-06-08 DIAGNOSIS — N186 End stage renal disease: Secondary | ICD-10-CM | POA: Diagnosis not present

## 2017-06-10 DIAGNOSIS — N186 End stage renal disease: Secondary | ICD-10-CM | POA: Diagnosis not present

## 2017-06-10 DIAGNOSIS — N2581 Secondary hyperparathyroidism of renal origin: Secondary | ICD-10-CM | POA: Diagnosis not present

## 2017-06-10 DIAGNOSIS — E1129 Type 2 diabetes mellitus with other diabetic kidney complication: Secondary | ICD-10-CM | POA: Diagnosis not present

## 2017-06-13 DIAGNOSIS — N2581 Secondary hyperparathyroidism of renal origin: Secondary | ICD-10-CM | POA: Diagnosis not present

## 2017-06-13 DIAGNOSIS — E1129 Type 2 diabetes mellitus with other diabetic kidney complication: Secondary | ICD-10-CM | POA: Diagnosis not present

## 2017-06-13 DIAGNOSIS — N186 End stage renal disease: Secondary | ICD-10-CM | POA: Diagnosis not present

## 2017-06-17 DIAGNOSIS — E1129 Type 2 diabetes mellitus with other diabetic kidney complication: Secondary | ICD-10-CM | POA: Diagnosis not present

## 2017-06-17 DIAGNOSIS — N186 End stage renal disease: Secondary | ICD-10-CM | POA: Diagnosis not present

## 2017-06-17 DIAGNOSIS — N2581 Secondary hyperparathyroidism of renal origin: Secondary | ICD-10-CM | POA: Diagnosis not present

## 2017-06-20 ENCOUNTER — Emergency Department (HOSPITAL_COMMUNITY): Payer: Medicare Other

## 2017-06-20 ENCOUNTER — Encounter (HOSPITAL_COMMUNITY): Payer: Self-pay | Admitting: *Deleted

## 2017-06-20 ENCOUNTER — Encounter: Payer: Self-pay | Admitting: Physician Assistant

## 2017-06-20 ENCOUNTER — Emergency Department (HOSPITAL_COMMUNITY)
Admission: EM | Admit: 2017-06-20 | Discharge: 2017-06-20 | Disposition: A | Discharge status: Home / Self Care | Payer: Medicare Other | Attending: Emergency Medicine | Admitting: Emergency Medicine

## 2017-06-20 ENCOUNTER — Other Ambulatory Visit: Payer: Self-pay

## 2017-06-20 DIAGNOSIS — I509 Heart failure, unspecified: Secondary | ICD-10-CM | POA: Insufficient documentation

## 2017-06-20 DIAGNOSIS — Z992 Dependence on renal dialysis: Secondary | ICD-10-CM | POA: Insufficient documentation

## 2017-06-20 DIAGNOSIS — M25511 Pain in right shoulder: Secondary | ICD-10-CM | POA: Diagnosis not present

## 2017-06-20 DIAGNOSIS — E1129 Type 2 diabetes mellitus with other diabetic kidney complication: Secondary | ICD-10-CM | POA: Diagnosis not present

## 2017-06-20 DIAGNOSIS — Z79899 Other long term (current) drug therapy: Secondary | ICD-10-CM | POA: Insufficient documentation

## 2017-06-20 DIAGNOSIS — I132 Hypertensive heart and chronic kidney disease with heart failure and with stage 5 chronic kidney disease, or end stage renal disease: Secondary | ICD-10-CM | POA: Diagnosis not present

## 2017-06-20 DIAGNOSIS — N186 End stage renal disease: Secondary | ICD-10-CM | POA: Diagnosis not present

## 2017-06-20 DIAGNOSIS — N2581 Secondary hyperparathyroidism of renal origin: Secondary | ICD-10-CM | POA: Diagnosis not present

## 2017-06-20 DIAGNOSIS — Z794 Long term (current) use of insulin: Secondary | ICD-10-CM | POA: Diagnosis not present

## 2017-06-20 DIAGNOSIS — E1122 Type 2 diabetes mellitus with diabetic chronic kidney disease: Secondary | ICD-10-CM | POA: Diagnosis not present

## 2017-06-20 MED ORDER — ACETAMINOPHEN 325 MG PO TABS
650.0000 mg | ORAL_TABLET | Freq: Once | ORAL | Status: AC
Start: 2017-06-20 — End: 2017-06-20
  Administered 2017-06-20: 650 mg via ORAL
  Filled 2017-06-20: qty 2

## 2017-06-20 MED ORDER — ACETAMINOPHEN 500 MG PO TABS: 500.0000 mg | ORAL_TABLET | Freq: Three times a day (TID) | ORAL | 0 refills | Status: DC | PRN

## 2017-06-20 MED ORDER — LIDOCAINE 5 % EX PTCH: 1.0000 | MEDICATED_PATCH | CUTANEOUS | 0 refills | Status: DC

## 2017-06-20 NOTE — ED Notes (Signed)
Pt alert and oriented x4. Pt complaint of right shoulder pain. Limited movement due to pain. Denies mechanical fall. Able to move fingers well. Cap refill on right hand less than 2 seconds. Pt arm placed on foam sling. Tolerated well

## 2017-06-20 NOTE — ED Triage Notes (Addendum)
Pt with R shoulder pain x 1 week.  Pain increases with movement and palpation.  Per family, shoulder was warm and swollen this am.  No obvious deformities or discoloration noted. Completed full 4 hours of dialysis today.  Acuity 3 d/t medical hx.

## 2017-06-20 NOTE — ED Notes (Signed)
Patient with difficulty standing in lobby, offered wheelchair and patient refused.  Patient appeared unsteady upon ambulation.  Asked patient to sit in wheelchair, patient and family member both refused.  Advised patient we don't want him to fall.  Patient states he is always unsteady on his feet.  Again patient and family refuse to allow me to place patient in wheelchair.  I followed patient closely as he ambulated to triage room.

## 2017-06-20 NOTE — Discharge Instructions (Addendum)
Please read attached information. If you experience any new or worsening signs or symptoms please return to the emergency room for evaluation. Please follow-up with your primary care provider or specialist as discussed. Please use medication prescribed only as directed and discontinue taking if you have any concerning signs or symptoms.   °

## 2017-06-20 NOTE — ED Provider Notes (Signed)
[facility] EMERGENCY DEPARTMENT Provider Note   CSN: [id number] Arrival date & time: 06/20/17  1743     History   Chief Complaint Chief Complaint  Patient presents with  . Shoulder Pain    HPI Howard Bell is a 52 y.o. male.  HPI   52 year old male with history of end-stage renal disease on dialysis, congestive heart failure, diabetes, hypertension presents today with right shoulder pain.  Patient's wife is at bedside who helps with history.  She notes last week patient had 3 days of right-sided shoulder pain that went away without complication.  She notes this again returned over the last several days with worsening pain on the shoulder.  She notes some subjective swelling and warmth to the shoulder this morning, none presently.  Patient reports he has pain with all range of motion of the shoulder, denies any fever, denies any distal neurological deficits.  He denies any trauma to the shoulder falls.  No history of similar symptoms.  No medications prior to arrival.  Patient denies any chest pain or shortness of breath.  Patient had 4 hours of dialysis today.    Past Medical History:  Diagnosis Date  . Congestive heart failure (CHF) ([place])   . 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 ESRD (end stage renal disease) on dialysis ([place])   . Hypertension     Patient Active Problem List   Diagnosis Date Noted  . Hyperkalemia 03/28/2017  . Left arm swelling 03/28/2017  . ESRD (end stage renal disease) ([place]) 03/28/2017  . Hypertensive urgency   . Chest pain 11/26/2016  . Essential hypertension 11/26/2016  . ESRD needing dialysis ([place]) 08/04/2016  . Acute respiratory failure with hypoxemia ([place]) 08/04/2016  . Diabetes mellitus with complication ([place]) 18/56/3149  . Acute CHF (congestive heart failure) ([place]) 08/04/2016  . Hypertensive emergency 08/04/2016  . Elevated troponin     Past Surgical History:  Procedure Laterality  Date  . HERNIA REPAIR    . IR AV DIALY SHUNT INTRO NEEDLE/INTRACATH INITIAL W/PTA/IMG LEFT  03/30/2017        Home Medications    Prior to Admission medications   Medication Sig Start Date End Date Taking? Authorizing Provider  acetaminophen (TYLENOL) 500 MG tablet Take 1 tablet (500 mg total) by mouth every 8 (eight) hours as needed. 06/20/17   Anara Cowman, Dellis Filbert, PA-C  amLODipine (NORVASC) 10 MG tablet Take 1 tablet (10 mg total) by mouth daily. 03/30/17   Hongalgi, Lenis Dickinson, MD  brimonidine (ALPHAGAN) 0.2 % ophthalmic solution Place 1 drop into both eyes 2 (two) times daily.    [provider]  diclofenac sodium (VOLTAREN) 1 % GEL Apply 4 g topically 4 (four) times daily as needed (pain). Patient not taking: Reported on 05/07/2017 03/30/17   Modena Jansky, MD  doxycycline (VIBRAMYCIN) 100 MG capsule Take 1 capsule (100 mg total) by mouth 2 (two) times daily. 05/07/17   Ivar Drape D, PA  insulin lispro (HUMALOG KWIKPEN) 100 UNIT/ML KiwkPen Inject 0-0.09 mLs (0-9 Units total) into the skin 3 (three) times daily with meals. CBG < 70: Eat or drink something sweet and recheck. CBG 70 - 120: 0 units CBG 121 - 150: 1 unit CBG 151 - 200: 2 units CBG 201 - 250: 3 units CBG 251 - 300: 5 units CBG 301 - 350: 7 units CBG 351 - 400: 9 units CBG > 400: call MD. 03/30/17   Modena Jansky, MD  lidocaine (LIDODERM) 5 % Place 1 patch onto the skin daily. Remove & Discard patch within 12 hours or as directed by MD 06/20/17   Macarius Ruark, Dellis Filbert, PA-C  metoprolol succinate (TOPROL-XL) 100 MG 24 hr tablet Take 1 tablet (100 mg total) by mouth daily. Take with or immediately following a meal. 03/30/17   Hongalgi, Lenis Dickinson, MD  pregabalin (LYRICA) 50 MG capsule Take 1 capsule (50 mg total) by mouth daily. 05/07/17   Ivar Drape D, PA  timolol (BETIMOL) 0.5 % ophthalmic solution Place 1 drop into both eyes 2 (two) times daily. 05/07/17   Ivar Drape D, PA  travoprost, benzalkonium, (TRAVATAN)  0.004 % ophthalmic solution Place 1 drop into both eyes at bedtime.    [provider]    Family History Family History  Problem Relation Age of Onset  . Diabetes Mother   . Hypertension Father     Social History Social History   Tobacco Use  . Smoking status: Never Smoker  . Smokeless tobacco: Never Used  Substance Use Topics  . Alcohol use: No  . Drug use: No     Allergies   Patient has no known allergies.   Review of Systems Review of Systems  All other systems reviewed and are negative.    Physical Exam Updated Vital Signs BP (!) 126/42   Pulse (!) 101   Temp 98.1 F (36.7 C) (Oral)   Resp 16   Ht 6' (1.829 m)   Wt 99.3 kg (219 lb)   SpO2 100%   BMI 29.70 kg/m   Physical Exam  Constitutional: He is oriented to person, place, and time. He appears well-developed and well-nourished.  HENT:  Head: Normocephalic and atraumatic.  Eyes: Pupils are equal, round, and reactive to light. Conjunctivae are normal. Right eye exhibits no discharge. Left eye exhibits no discharge. No scleral icterus.  Neck: Normal range of motion. No JVD present. No tracheal deviation present.  Pulmonary/Chest: Effort normal. No stridor.  Musculoskeletal:  Left shoulder atraumatic no swelling or edema, no warmth to touch, no redness-tenderness to palpation of the entire shoulder, pain with range of motion which is somewhat limited secondary to pain abduction/extension to 45 degrees-radial pulse 2+ grip strength 5 out of 5 sensation intact  Neurological: He is alert and oriented to person, place, and time. Coordination normal.  Psychiatric: He has a normal mood and affect. His behavior is normal. Judgment and thought content normal.  Nursing note and vitals reviewed.    ED Treatments / Results  Labs (all labs ordered are listed, but only abnormal results are displayed) Labs Reviewed - No data to display  EKG None  Radiology Dg Shoulder Right  Result Date:  06/20/2017 CLINICAL DATA:  52 y/o  M; shoulder pain. EXAM: RIGHT SHOULDER - 2+ VIEW COMPARISON:  None. FINDINGS: There is no evidence of fracture or dislocation. There is no evidence of arthropathy or other focal bone abnormality. Soft tissues are unremarkable. IMPRESSION: Negative. Electronically Signed   By: Kristine Garbe M.D.   On: 06/20/2017 19:39    Procedures Procedures (including critical care time)  Medications Ordered in ED Medications  acetaminophen (TYLENOL) tablet 650 mg (650 mg Oral Given 06/20/17 2102)     Initial Impression / Assessment and Plan / ED Course  I have reviewed the triage vital signs and the nursing notes.  Pertinent labs & imaging results that were available during my care of the patient were reviewed by me and considered in my medical decision  making (see chart for details).     Final Clinical Impressions(s) / ED Diagnoses   Final diagnoses:  Acute pain of right shoulder    Labs:   Imaging: DG shoulder right  Consults:  Therapeutics:  Discharge Meds: lidocaine, tylenol   Assessment/Plan: 52 year old male presents today with complaints of shoulder pain.  This is likely musculoskeletal in nature.  He has no signs of infectious etiology, he has a negative chest x-ray with no swelling or bony abnormalities.  No signs of septic joint, gout.  Patient not have any chest or abdominal pain that would be referred pain to the shoulder.  Patient has no neurological deficits.  Patient was placed in a splint, encouraged to use prescribed Voltaren, Tylenol as needed, and follow-up with his primary care if symptoms persist return to emergency room if they worsen.  He verbalized understanding and agreement to today's plan had no further questions or concerns.   ED Discharge Orders        Ordered    acetaminophen (TYLENOL) 500 MG tablet  Every 8 hours PRN     06/20/17 2056    lidocaine (LIDODERM) 5 %  Every 24 hours     06/20/17 2059       Okey Regal, PA-C 06/20/17 2137    Daleen Bo, MD 06/22/17 2037

## 2017-06-22 DIAGNOSIS — N186 End stage renal disease: Secondary | ICD-10-CM | POA: Diagnosis not present

## 2017-06-22 DIAGNOSIS — E1129 Type 2 diabetes mellitus with other diabetic kidney complication: Secondary | ICD-10-CM | POA: Diagnosis not present

## 2017-06-22 DIAGNOSIS — N2581 Secondary hyperparathyroidism of renal origin: Secondary | ICD-10-CM | POA: Diagnosis not present

## 2017-06-24 DIAGNOSIS — E1129 Type 2 diabetes mellitus with other diabetic kidney complication: Secondary | ICD-10-CM | POA: Diagnosis not present

## 2017-06-24 DIAGNOSIS — N186 End stage renal disease: Secondary | ICD-10-CM | POA: Diagnosis not present

## 2017-06-24 DIAGNOSIS — N2581 Secondary hyperparathyroidism of renal origin: Secondary | ICD-10-CM | POA: Diagnosis not present

## 2017-06-27 DIAGNOSIS — N2581 Secondary hyperparathyroidism of renal origin: Secondary | ICD-10-CM | POA: Diagnosis not present

## 2017-06-27 DIAGNOSIS — N186 End stage renal disease: Secondary | ICD-10-CM | POA: Diagnosis not present

## 2017-06-27 DIAGNOSIS — E1129 Type 2 diabetes mellitus with other diabetic kidney complication: Secondary | ICD-10-CM | POA: Diagnosis not present

## 2017-06-28 ENCOUNTER — Encounter: Payer: Self-pay | Admitting: Sports Medicine

## 2017-06-28 ENCOUNTER — Ambulatory Visit (INDEPENDENT_AMBULATORY_CARE_PROVIDER_SITE_OTHER): Payer: Medicare Other | Admitting: Sports Medicine

## 2017-06-28 VITALS — BP 111/66 | HR 99 | Resp 16

## 2017-06-28 DIAGNOSIS — B351 Tinea unguium: Secondary | ICD-10-CM

## 2017-06-28 DIAGNOSIS — E1140 Type 2 diabetes mellitus with diabetic neuropathy, unspecified: Secondary | ICD-10-CM

## 2017-06-28 DIAGNOSIS — M79674 Pain in right toe(s): Secondary | ICD-10-CM | POA: Diagnosis not present

## 2017-06-28 DIAGNOSIS — M79675 Pain in left toe(s): Secondary | ICD-10-CM

## 2017-06-28 DIAGNOSIS — L97521 Non-pressure chronic ulcer of other part of left foot limited to breakdown of skin: Secondary | ICD-10-CM | POA: Diagnosis not present

## 2017-06-28 MED ORDER — AMOXICILLIN-POT CLAVULANATE 875-125 MG PO TABS: 1.0000 | ORAL_TABLET | Freq: Two times a day (BID) | ORAL | 0 refills | Status: DC

## 2017-06-28 NOTE — Progress Notes (Signed)
Subjective: Howard Bell is a 52 y.o. male patient seen in office for evaluation of ulceration of the Left foot >1 year off and on and for nail care. Patient has a history of diabetes and a blood glucose level today of 92 mg/dl.   Patient is changing the dressing using nothing. Denies nausea/fever/vomiting/chills/night sweats/shortness of breath/pain. Patient has no other pedal complaints at this time.  Review of Systems  All other systems reviewed and are negative.    Patient Active Problem List   Diagnosis Date Noted  . Hyperkalemia 03/28/2017  . Left arm swelling 03/28/2017  . ESRD (end stage renal disease) ([place]) 03/28/2017  . Hypertensive urgency   . Chest pain 11/26/2016  . Essential hypertension 11/26/2016  . ESRD needing dialysis ([place]) 08/04/2016  . Acute respiratory failure with hypoxemia ([place]) 08/04/2016  . Diabetes mellitus with complication ([place]) 15/83/0940  . Acute CHF (congestive heart failure) ([place]) 08/04/2016  . Hypertensive emergency 08/04/2016  . Elevated troponin    Current Outpatient Medications on File Prior to Visit  Medication Sig Dispense Refill  . acetaminophen (TYLENOL) 500 MG tablet Take 1 tablet (500 mg total) by mouth every 8 (eight) hours as needed. 15 tablet 0  . amLODipine (NORVASC) 10 MG tablet Take 1 tablet (10 mg total) by mouth daily. 30 tablet 0  . brimonidine (ALPHAGAN) 0.2 % ophthalmic solution Place 1 drop into both eyes 2 (two) times daily.    . diclofenac sodium (VOLTAREN) 1 % GEL Apply 4 g topically 4 (four) times daily as needed (pain).    Marland Kitchen doxycycline (VIBRAMYCIN) 100 MG capsule Take 1 capsule (100 mg total) by mouth 2 (two) times daily. 20 capsule 0  . insulin lispro (HUMALOG KWIKPEN) 100 UNIT/ML KiwkPen Inject 0-0.09 mLs (0-9 Units total) into the skin 3 (three) times daily with meals. CBG < 70: Eat or drink something sweet and recheck. CBG 70 - 120: 0 units CBG 121 - 150: 1 unit CBG 151 - 200: 2 units CBG 201 - 250: 3  units CBG 251 - 300: 5 units CBG 301 - 350: 7 units CBG 351 - 400: 9 units CBG > 400: call MD.    . lidocaine (LIDODERM) 5 % Place 1 patch onto the skin daily. Remove & Discard patch within 12 hours or as directed by MD 30 patch 0  . metoprolol succinate (TOPROL-XL) 100 MG 24 hr tablet Take 1 tablet (100 mg total) by mouth daily. Take with or immediately following a meal. 30 tablet 0  . pregabalin (LYRICA) 50 MG capsule Take 1 capsule (50 mg total) by mouth daily. 90 capsule 1  . timolol (BETIMOL) 0.5 % ophthalmic solution Place 1 drop into both eyes 2 (two) times daily. 10 mL 1  . travoprost, benzalkonium, (TRAVATAN) 0.004 % ophthalmic solution Place 1 drop into both eyes at bedtime.     No current facility-administered medications on file prior to visit.    No Known Allergies  Recent Results (from the past 2160 hour(s))  CBC with Differential/Platelet     Status: Abnormal   Collection Time: 03/30/17 10:25 AM  Result Value Ref Range   WBC 6.6 4.0 - 10.5 K/uL   RBC 4.22 4.22 - 5.81 MIL/uL   Hemoglobin 13.0 13.0 - 17.0 g/dL   HCT 39.6 39.0 - 52.0 %   MCV 93.8 78.0 - 100.0 fL   MCH 30.8 26.0 - 34.0 pg   MCHC 32.8 30.0 - 36.0 g/dL   RDW 14.7 11.5 - 15.5 %  Platelets 147 (L) 150 - 400 K/uL   Neutrophils Relative % 52 %   Neutro Abs 3.5 1.7 - 7.7 K/uL   Lymphocytes Relative 34 %   Lymphs Abs 2.2 0.7 - 4.0 K/uL   Monocytes Relative 9 %   Monocytes Absolute 0.6 0.1 - 1.0 K/uL   Eosinophils Relative 4 %   Eosinophils Absolute 0.2 0.0 - 0.7 K/uL   Basophils Relative 1 %   Basophils Absolute 0.1 0.0 - 0.1 K/uL  Comprehensive metabolic panel     Status: Abnormal   Collection Time: 03/30/17 10:25 AM  Result Value Ref Range   Sodium 136 135 - 145 mmol/L   Potassium 5.5 (H) 3.5 - 5.1 mmol/L   Chloride 94 (L) 101 - 111 mmol/L   CO2 24 22 - 32 mmol/L   Glucose, Bld 170 (H) 65 - 99 mg/dL   BUN 82 (H) 6 - 20 mg/dL   Creatinine, Ser 17.35 (H) 0.61 - 1.24 mg/dL   Calcium 9.7 8.9 - 10.3  mg/dL   Total Protein 7.9 6.5 - 8.1 g/dL   Albumin 3.5 3.5 - 5.0 g/dL   AST 17 15 - 41 U/L   ALT 13 (L) 17 - 63 U/L   Alkaline Phosphatase 131 (H) 38 - 126 U/L   Total Bilirubin 1.1 0.3 - 1.2 mg/dL   GFR calc non Af Amer 3 (L) >60 mL/min   GFR calc Af Amer 3 (L) >60 mL/min    Comment: (NOTE) The eGFR has been calculated using the CKD EPI equation. This calculation has not been validated in all clinical situations. eGFR's persistently <60 mL/min signify possible Chronic Kidney Disease.    Anion gap 18 (H) 5 - 15  Magnesium     Status: Abnormal   Collection Time: 03/30/17 10:25 AM  Result Value Ref Range   Magnesium 2.8 (H) 1.7 - 2.4 mg/dL  Phosphorus     Status: Abnormal   Collection Time: 03/30/17 10:25 AM  Result Value Ref Range   Phosphorus 11.3 (H) 2.5 - 4.6 mg/dL  Glucose, capillary     Status: Abnormal   Collection Time: 03/30/17 10:55 AM  Result Value Ref Range   Glucose-Capillary 154 (H) 65 - 99 mg/dL  Glucose, capillary     Status: Abnormal   Collection Time: 03/30/17  4:49 PM  Result Value Ref Range   Glucose-Capillary 193 (H) 65 - 99 mg/dL  Basic metabolic panel     Status: Abnormal   Collection Time: 05/07/17  8:54 AM  Result Value Ref Range   Glucose 202 (H) 65 - 99 mg/dL   BUN 37 (H) 6 - 24 mg/dL   Creatinine, Ser 9.73 (HH) 0.76 - 1.27 mg/dL    Comment:                   Client Requested Flag   GFR calc non Af Amer 6 (L) >59 mL/min/1.73   GFR calc Af Amer 6 (L) >59 mL/min/1.73   BUN/Creatinine Ratio 4 (L) 9 - 20   Sodium 141 134 - 144 mmol/L   Potassium 4.1 3.5 - 5.2 mmol/L   Chloride 93 (L) 96 - 106 mmol/L   CO2 24 20 - 29 mmol/L   Calcium 8.6 (L) 8.7 - 10.2 mg/dL  Brain natriuretic peptide     Status: None   Collection Time: 05/07/17  8:54 AM  Result Value Ref Range   BNP 97.5 0.0 - 100.0 pg/mL    Objective: There were no  vitals filed for this visit.  General: Patient is awake, alert, oriented x 3 and in no acute distress.  Dermatology: Skin is  warm and dry bilateral with a partial thickness ulceration present  Sub met 1 on left. Ulceration measures 0.5 cm x 0.5 cm x 0.1 cm. There is a  Keratotic border with a granular base. The ulceration does not  probe to bone. There is faint malodor, no active drainage, no erythema, no edema. No acute signs of infection.  Callus heels and sub met 5 bilateral  Nails x 10 are thick and long consistent with onychomycosis    Vascular: Dorsalis Pedis pulse = 1/4 Bilateral,  Posterior Tibial pulse = 1/4 Bilateral,  Capillary Fill Time < 5 seconds  Neurologic: Protective sensation severely diminished using  the 5.07/10g [employer].  Musculosketal: No Pain with palpation to ulcerated area. No pain with compression to calves bilateral. Bunion and hammertoe gross bony deformities noted bilateral.   Recent Labs    11/19/16 0020  LABORGA ESCHERICHIA COLI*    Assessment and Plan:  Problem List Items Addressed This Visit    None    Visit Diagnoses    Chronic foot ulcer, left, limited to breakdown of skin ([place])    -  Primary   Pain due to onychomycosis of toenails of both feet       Type 2 diabetes mellitus with diabetic neuropathy, without long-term current use of insulin ([place])           -Examined patient -Debrided nails x 10 using sterile nail nipper without incident  - Discussed the progression of the wound and treatment alternatives. -Xrays reviewed - Excisionally dedbrided ulceration at Left sub met 1 to healthy bleeding borders removing nonviable tissue using a sterile chisel blade. Wound measures post debridement as above. Wound was debrided to the level of the dermis with viable wound base exposed to promote healing. Hemostasis was achieved with manuel pressure. Patient tolerated procedure well without any discomfort or anesthesia necessary for this wound debridement. Wound culture obtained -Rx Augmentin  -Applied iodosorb and dry sterile dressing and instructed  patient to continue with daily dressings at home consisting of betadine bandaid/dry sterile dressing. - Advised patient to go to the ER or return to office if the wound worsens or if constitutional symptoms are present. -Patient to return to office in 3 weeks for follow up care and evaluation or sooner if problems arise.  Landis Martins, DPM

## 2017-06-28 NOTE — Patient Instructions (Signed)
Apply betadine and dry dressing/bandaid daily to left foot wound until next office visit

## 2017-06-28 NOTE — Addendum Note (Signed)
Addended by: Celene Skeen A on: 06/28/2017 05:29 PM   Modules accepted: Orders

## 2017-07-01 DIAGNOSIS — N2581 Secondary hyperparathyroidism of renal origin: Secondary | ICD-10-CM | POA: Diagnosis not present

## 2017-07-01 DIAGNOSIS — E1129 Type 2 diabetes mellitus with other diabetic kidney complication: Secondary | ICD-10-CM | POA: Diagnosis not present

## 2017-07-01 DIAGNOSIS — N186 End stage renal disease: Secondary | ICD-10-CM | POA: Diagnosis not present

## 2017-07-03 LAB — WOUND CULTURE
MICRO NUMBER:: 90442552
SPECIMEN QUALITY:: ADEQUATE

## 2017-07-04 ENCOUNTER — Telehealth: Payer: Self-pay | Admitting: *Deleted

## 2017-07-04 DIAGNOSIS — N186 End stage renal disease: Secondary | ICD-10-CM | POA: Diagnosis not present

## 2017-07-04 DIAGNOSIS — N2581 Secondary hyperparathyroidism of renal origin: Secondary | ICD-10-CM | POA: Diagnosis not present

## 2017-07-04 DIAGNOSIS — E1129 Type 2 diabetes mellitus with other diabetic kidney complication: Secondary | ICD-10-CM | POA: Diagnosis not present

## 2017-07-04 NOTE — Telephone Encounter (Signed)
-----   Message from Landis Martins, [place] sent at 07/04/2017  2:33 PM EDT ----- Will you let patient know that his wound culture came back positive and that he should continue the Augmentin that I prescribed until he has completed it -Dr. Chauncey Cruel

## 2017-07-04 NOTE — Telephone Encounter (Signed)
Left message informing pt of Dr. Leeanne Rio review of results and orders and that due to the importance of him continuing the antibiotic to completion I left the voicemail message.

## 2017-07-06 DIAGNOSIS — N2581 Secondary hyperparathyroidism of renal origin: Secondary | ICD-10-CM | POA: Diagnosis not present

## 2017-07-06 DIAGNOSIS — N186 End stage renal disease: Secondary | ICD-10-CM | POA: Diagnosis not present

## 2017-07-06 DIAGNOSIS — E1129 Type 2 diabetes mellitus with other diabetic kidney complication: Secondary | ICD-10-CM | POA: Diagnosis not present

## 2017-07-08 DIAGNOSIS — N186 End stage renal disease: Secondary | ICD-10-CM | POA: Diagnosis not present

## 2017-07-08 DIAGNOSIS — N2581 Secondary hyperparathyroidism of renal origin: Secondary | ICD-10-CM | POA: Diagnosis not present

## 2017-07-08 DIAGNOSIS — E1129 Type 2 diabetes mellitus with other diabetic kidney complication: Secondary | ICD-10-CM | POA: Diagnosis not present

## 2017-07-11 DIAGNOSIS — N186 End stage renal disease: Secondary | ICD-10-CM | POA: Diagnosis not present

## 2017-07-11 DIAGNOSIS — N2581 Secondary hyperparathyroidism of renal origin: Secondary | ICD-10-CM | POA: Diagnosis not present

## 2017-07-11 DIAGNOSIS — E1129 Type 2 diabetes mellitus with other diabetic kidney complication: Secondary | ICD-10-CM | POA: Diagnosis not present

## 2017-07-13 DIAGNOSIS — N2581 Secondary hyperparathyroidism of renal origin: Secondary | ICD-10-CM | POA: Diagnosis not present

## 2017-07-13 DIAGNOSIS — E1129 Type 2 diabetes mellitus with other diabetic kidney complication: Secondary | ICD-10-CM | POA: Diagnosis not present

## 2017-07-13 DIAGNOSIS — N186 End stage renal disease: Secondary | ICD-10-CM | POA: Diagnosis not present

## 2017-07-14 ENCOUNTER — Ambulatory Visit: Payer: Medicare Other | Admitting: Neurology

## 2017-07-15 DIAGNOSIS — E1129 Type 2 diabetes mellitus with other diabetic kidney complication: Secondary | ICD-10-CM | POA: Diagnosis not present

## 2017-07-15 DIAGNOSIS — N186 End stage renal disease: Secondary | ICD-10-CM | POA: Diagnosis not present

## 2017-07-15 DIAGNOSIS — N2581 Secondary hyperparathyroidism of renal origin: Secondary | ICD-10-CM | POA: Diagnosis not present

## 2017-07-18 DIAGNOSIS — N186 End stage renal disease: Secondary | ICD-10-CM | POA: Diagnosis not present

## 2017-07-18 DIAGNOSIS — N2581 Secondary hyperparathyroidism of renal origin: Secondary | ICD-10-CM | POA: Diagnosis not present

## 2017-07-18 DIAGNOSIS — E1129 Type 2 diabetes mellitus with other diabetic kidney complication: Secondary | ICD-10-CM | POA: Diagnosis not present

## 2017-07-19 ENCOUNTER — Ambulatory Visit: Payer: Medicare Other | Admitting: Sports Medicine

## 2017-07-20 DIAGNOSIS — Z992 Dependence on renal dialysis: Secondary | ICD-10-CM | POA: Diagnosis not present

## 2017-07-20 DIAGNOSIS — N186 End stage renal disease: Secondary | ICD-10-CM | POA: Diagnosis not present

## 2017-07-20 DIAGNOSIS — N2581 Secondary hyperparathyroidism of renal origin: Secondary | ICD-10-CM | POA: Diagnosis not present

## 2017-07-20 DIAGNOSIS — D509 Iron deficiency anemia, unspecified: Secondary | ICD-10-CM | POA: Diagnosis not present

## 2017-07-20 DIAGNOSIS — E1129 Type 2 diabetes mellitus with other diabetic kidney complication: Secondary | ICD-10-CM | POA: Diagnosis not present

## 2017-07-22 DIAGNOSIS — D509 Iron deficiency anemia, unspecified: Secondary | ICD-10-CM | POA: Diagnosis not present

## 2017-07-22 DIAGNOSIS — E1129 Type 2 diabetes mellitus with other diabetic kidney complication: Secondary | ICD-10-CM | POA: Diagnosis not present

## 2017-07-22 DIAGNOSIS — N186 End stage renal disease: Secondary | ICD-10-CM | POA: Diagnosis not present

## 2017-07-22 DIAGNOSIS — N2581 Secondary hyperparathyroidism of renal origin: Secondary | ICD-10-CM | POA: Diagnosis not present

## 2017-07-25 DIAGNOSIS — N186 End stage renal disease: Secondary | ICD-10-CM | POA: Diagnosis not present

## 2017-07-25 DIAGNOSIS — N2581 Secondary hyperparathyroidism of renal origin: Secondary | ICD-10-CM | POA: Diagnosis not present

## 2017-07-25 DIAGNOSIS — E1129 Type 2 diabetes mellitus with other diabetic kidney complication: Secondary | ICD-10-CM | POA: Diagnosis not present

## 2017-07-25 DIAGNOSIS — D509 Iron deficiency anemia, unspecified: Secondary | ICD-10-CM | POA: Diagnosis not present

## 2017-07-26 ENCOUNTER — Other Ambulatory Visit: Payer: Self-pay

## 2017-07-26 ENCOUNTER — Encounter: Payer: Self-pay | Admitting: Emergency Medicine

## 2017-07-26 ENCOUNTER — Ambulatory Visit (INDEPENDENT_AMBULATORY_CARE_PROVIDER_SITE_OTHER): Payer: Medicare Other | Admitting: Emergency Medicine

## 2017-07-26 VITALS — HR 109 | Temp 98.4°F | Resp 16 | Ht 75.25 in | Wt 233.4 lb

## 2017-07-26 DIAGNOSIS — M7551 Bursitis of right shoulder: Secondary | ICD-10-CM | POA: Insufficient documentation

## 2017-07-26 DIAGNOSIS — Z992 Dependence on renal dialysis: Secondary | ICD-10-CM | POA: Diagnosis not present

## 2017-07-26 DIAGNOSIS — N186 End stage renal disease: Secondary | ICD-10-CM | POA: Diagnosis not present

## 2017-07-26 DIAGNOSIS — M25511 Pain in right shoulder: Secondary | ICD-10-CM | POA: Diagnosis not present

## 2017-07-26 DIAGNOSIS — R2232 Localized swelling, mass and lump, left upper limb: Secondary | ICD-10-CM | POA: Diagnosis not present

## 2017-07-26 DIAGNOSIS — I871 Compression of vein: Secondary | ICD-10-CM | POA: Diagnosis not present

## 2017-07-26 MED ORDER — KETOROLAC TROMETHAMINE 60 MG/2ML IM SOLN
60.0000 mg | Freq: Once | INTRAMUSCULAR | Status: AC
  Administered 2017-07-26: 60 mg via INTRAMUSCULAR

## 2017-07-26 MED ORDER — HYDROCODONE-ACETAMINOPHEN 5-325 MG PO TABS: 1.0000 | ORAL_TABLET | Freq: Four times a day (QID) | ORAL | 0 refills | Status: DC | PRN

## 2017-07-26 NOTE — Patient Instructions (Addendum)
     IF you received an x-ray today, you will receive an invoice from [facility]. Please contact [facility] at [phone] with questions or concerns regarding your invoice.   IF you received labwork today, you will receive an invoice from [facility]. Please contact [facility] at [phone] with questions or concerns regarding your invoice.   Our billing staff will not be able to assist you with questions regarding bills from these companies.  You will be contacted with the lab results as soon as they are available. The fastest way to get your results is to activate your My Chart account. Instructions are located on the last page of this paperwork. If you have not heard from us regarding the results in 2 weeks, please contact this office.     Shoulder Pain Many things can cause shoulder pain, including:  An injury.  Moving the arm in the same way again and again (overuse).  Joint pain (arthritis).  Follow these instructions at home: Take these actions to help with your pain:  Squeeze a soft ball or a foam pad as much as you can. This helps to prevent swelling. It also makes the arm stronger.  Take over-the-counter and prescription medicines only as told by your doctor.  If told, put ice on the area: ? Put ice in a plastic bag. ? Place a towel between your skin and the bag. ? Leave the ice on for 20 minutes, 2-3 times per day. Stop putting on ice if it does not help with the pain.  If you were given a shoulder sling or immobilizer: ? Wear it as told. ? Remove it to shower or bathe. ? Move your arm as little as possible. ? Keep your hand moving. This helps prevent swelling.  Contact a doctor if:  Your pain gets worse.  Medicine does not help your pain.  You have new pain in your arm, hand, or fingers. Get help right away if:  Your arm, hand, or fingers: ? Tingle. ? Are numb. ? Are swollen. ? Are painful. ? Turn white or blue. This information is  not intended to replace advice given to you by your health care provider. Make sure you discuss any questions you have with your health care provider. Document Released: 08/25/2007 Document Revised: 11/02/2015 Document Reviewed: 07/01/2014 Elsevier Interactive Patient Education  2018 [employer]  

## 2017-07-26 NOTE — Progress Notes (Signed)
Howard Bell 52 y.o.   Chief Complaint  Patient presents with  . Arm Pain    RIGHT and shoulder pain x 3-4 weeks    HISTORY OF PRESENT ILLNESS: This is a 52 y.o. male complaining of pain to the right shoulder for the past 3 to 4 weeks.  Denies trauma.  Patient has a history of end-stage renal disease on dialysis, diabetes, congestive heart failure, hypertension.  Was seen for the same in the emergency department on 06/20/2017.  X-ray was unremarkable.  Pain control not adequate.  Onset: 3 to 4 weeks ago Location: Right shoulder Duration: Constant Character: Sharp Aggravating factors: Movement Relieving factors: Nothing Therapies tried: Tylenol Severity: Severe  HPI   Prior to Admission medications   Medication Sig Start Date End Date Taking? Authorizing Provider  acetaminophen (TYLENOL) 500 MG tablet Take 1 tablet (500 mg total) by mouth every 8 (eight) hours as needed. 06/20/17  Yes Hedges, Dellis Filbert, PA-C  amLODipine (NORVASC) 10 MG tablet Take 1 tablet (10 mg total) by mouth daily. 03/30/17  Yes Hongalgi, Lenis Dickinson, MD  brimonidine (ALPHAGAN) 0.2 % ophthalmic solution Place 1 drop into both eyes 2 (two) times daily.   Yes [provider]  insulin lispro (HUMALOG KWIKPEN) 100 UNIT/ML KiwkPen Inject 0-0.09 mLs (0-9 Units total) into the skin 3 (three) times daily with meals. CBG < 70: Eat or drink something sweet and recheck. CBG 70 - 120: 0 units CBG 121 - 150: 1 unit CBG 151 - 200: 2 units CBG 201 - 250: 3 units CBG 251 - 300: 5 units CBG 301 - 350: 7 units CBG 351 - 400: 9 units CBG > 400: call MD. 03/30/17  Yes Hongalgi, Lenis Dickinson, MD  lidocaine (LIDODERM) 5 % Place 1 patch onto the skin daily. Remove & Discard patch within 12 hours or as directed by MD 06/20/17  Yes Hedges, Dellis Filbert, PA-C  metoprolol succinate (TOPROL-XL) 100 MG 24 hr tablet Take 1 tablet (100 mg total) by mouth daily. Take with or immediately following a meal. 03/30/17  Yes Hongalgi, Lenis Dickinson, MD  pregabalin  (LYRICA) 50 MG capsule Take 1 capsule (50 mg total) by mouth daily. 05/07/17  Yes English, Colletta Maryland D, PA  timolol (BETIMOL) 0.5 % ophthalmic solution Place 1 drop into both eyes 2 (two) times daily. 05/07/17  Yes English, Colletta Maryland D, PA  travoprost, benzalkonium, (TRAVATAN) 0.004 % ophthalmic solution Place 1 drop into both eyes at bedtime.   Yes [provider]  amoxicillin-clavulanate (AUGMENTIN) 875-125 MG tablet Take 1 tablet by mouth 2 (two) times daily. Patient not taking: Reported on 07/26/2017 06/28/17   Landis Martins, DPM  diclofenac sodium (VOLTAREN) 1 % GEL Apply 4 g topically 4 (four) times daily as needed (pain). Patient not taking: Reported on 07/26/2017 03/30/17   Modena Jansky, MD  doxycycline (VIBRAMYCIN) 100 MG capsule Take 1 capsule (100 mg total) by mouth 2 (two) times daily. Patient not taking: Reported on 07/26/2017 05/07/17   Ivar Drape D, PA  HYDROcodone-acetaminophen (NORCO) 5-325 MG tablet Take 1 tablet by mouth every 6 (six) hours as needed for moderate pain. 07/26/17   Horald Pollen, MD    No Known Allergies  Patient Active Problem List   Diagnosis Date Noted  . Hyperkalemia 03/28/2017  . Left arm swelling 03/28/2017  . ESRD (end stage renal disease) ([place]) 03/28/2017  . Hypertensive urgency   . Chest pain 11/26/2016  . Essential hypertension 11/26/2016  . ESRD needing dialysis ([place]) 08/04/2016  .  Acute respiratory failure with hypoxemia ([place]) 08/04/2016  . Diabetes mellitus with complication ([place]) 24/58/0998  . Acute CHF (congestive heart failure) ([place]) 08/04/2016  . Hypertensive emergency 08/04/2016  . Elevated troponin     Past Medical History:  Diagnosis Date  . Congestive heart failure (CHF) ([place])   . 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 ESRD (end stage renal disease) on dialysis ([place])   . Hypertension     Past Surgical History:  Procedure Laterality Date  . HERNIA REPAIR    . IR AV DIALY  SHUNT INTRO NEEDLE/INTRACATH INITIAL W/PTA/IMG LEFT  03/30/2017    Social History   Socioeconomic History  . Marital status: Married    Spouse name: Not on file  . Number of children: Not on file  . Years of education: Not on file  . Highest education level: Not on file  Occupational History  . Not on file  Social Needs  . Financial resource strain: Not on file  . Food insecurity:    Worry: Not on file    Inability: Not on file  . Transportation needs:    Medical: Not on file    Non-medical: Not on file  Tobacco Use  . Smoking status: Never Smoker  . Smokeless tobacco: Never Used  Substance and Sexual Activity  . Alcohol use: No  . Drug use: No  . Sexual activity: Not on file  Lifestyle  . Physical activity:    Days per week: Not on file    Minutes per session: Not on file  . Stress: Not on file  Relationships  . Social connections:    Talks on phone: Not on file    Gets together: Not on file    Attends religious service: Not on file    Active member of club or organization: Not on file    Attends meetings of clubs or organizations: Not on file    Relationship status: Not on file  . Intimate partner violence:    Fear of current or ex partner: Not on file    Emotionally abused: Not on file    Physically abused: Not on file    Forced sexual activity: Not on file  Other Topics Concern  . Not on file  Social History Narrative  . Not on file    Family History  Problem Relation Age of Onset  . Diabetes Mother   . Hypertension Father      ROS Vitals:   07/26/17 1659  Pulse: (!) 109  Resp: 16  Temp: 98.4 F (36.9 C)  SpO2: 92%    Physical Exam  Vitals reviewed. Constitutional: He is oriented to person, place, and time. He appears well-developed and well-nourished.  HENT:  Head: Normocephalic and atraumatic.  Eyes: Pupils are equal, round, and reactive to light. Conjunctivae are normal.  Neck: Normal range of motion.  Cardiovascular: Normal rate and  regular rhythm.  Respiratory: Effort normal and breath sounds normal.  Musculoskeletal:  Right shoulder: No erythema or ecchymosis.  Tender to palpation.  No significant swelling.  Very limited range of motion due to pain. Right elbow: Within normal limits and full range of motion Right wrist: Within normal limits and full range of motion Right hand: NVI.  Neurological: He is alert and oriented to person, place, and time.  Skin: Skin is warm and dry.      ASSESSMENT & PLAN: Worth was seen today for arm pain.  Diagnoses and all orders for  this visit:  Acute pain of right shoulder -     ketorolac (TORADOL) injection 60 mg -     HYDROcodone-acetaminophen (NORCO) 5-325 MG tablet; Take 1 tablet by mouth every 6 (six) hours as needed for moderate pain. -     Ambulatory referral to Orthopedic Surgery  Acute bursitis of right shoulder    Patient Instructions       IF you received an x-ray today, you will receive an invoice from [facility]. Please contact [facility] at [phone] with questions or concerns regarding your invoice.   IF you received labwork today, you will receive an invoice from [facility]. Please contact [facility] at [phone] with questions or concerns regarding your invoice.   Our billing staff will not be able to assist you with questions regarding bills from these companies.  You will be contacted with the lab results as soon as they are available. The fastest way to get your results is to activate your My Chart account. Instructions are located on the last page of this paperwork. If you have not heard from [place] regarding the results in 2 weeks, please contact this office.     Shoulder Pain Many things can cause shoulder pain, including:  An injury.  Moving the arm in the same way again and again (overuse).  Joint pain (arthritis).  Follow these instructions at home: Take these actions to help with your pain:  Squeeze a soft ball  or a foam pad as much as you can. This helps to prevent swelling. It also makes the arm stronger.  Take over-the-counter and prescription medicines only as told by your doctor.  If told, put ice on the area: ? Put ice in a plastic bag. ? Place a towel between your skin and the bag. ? Leave the ice on for 20 minutes, 2-3 times per day. Stop putting on ice if it does not help with the pain.  If you were given a shoulder sling or immobilizer: ? Wear it as told. ? Remove it to shower or bathe. ? Move your arm as little as possible. ? Keep your hand moving. This helps prevent swelling.  Contact a doctor if:  Your pain gets worse.  Medicine does not help your pain.  You have new pain in your arm, hand, or fingers. Get help right away if:  Your arm, hand, or fingers: ? Tingle. ? Are numb. ? Are swollen. ? Are painful. ? Turn white or blue. This information is not intended to replace advice given to you by your health care provider. Make sure you discuss any questions you have with your health care provider. Document Released: 08/25/2007 Document Revised: 11/02/2015 Document Reviewed: 07/01/2014 Elsevier Interactive Patient Education  2018 [employer].      Agustina Caroli, MD [facility]

## 2017-07-27 DIAGNOSIS — N2581 Secondary hyperparathyroidism of renal origin: Secondary | ICD-10-CM | POA: Diagnosis not present

## 2017-07-27 DIAGNOSIS — D509 Iron deficiency anemia, unspecified: Secondary | ICD-10-CM | POA: Diagnosis not present

## 2017-07-27 DIAGNOSIS — E1129 Type 2 diabetes mellitus with other diabetic kidney complication: Secondary | ICD-10-CM | POA: Diagnosis not present

## 2017-07-27 DIAGNOSIS — N186 End stage renal disease: Secondary | ICD-10-CM | POA: Diagnosis not present

## 2017-07-29 DIAGNOSIS — N186 End stage renal disease: Secondary | ICD-10-CM | POA: Diagnosis not present

## 2017-07-29 DIAGNOSIS — N2581 Secondary hyperparathyroidism of renal origin: Secondary | ICD-10-CM | POA: Diagnosis not present

## 2017-07-29 DIAGNOSIS — E1129 Type 2 diabetes mellitus with other diabetic kidney complication: Secondary | ICD-10-CM | POA: Diagnosis not present

## 2017-07-29 DIAGNOSIS — D509 Iron deficiency anemia, unspecified: Secondary | ICD-10-CM | POA: Diagnosis not present

## 2017-08-01 DIAGNOSIS — D509 Iron deficiency anemia, unspecified: Secondary | ICD-10-CM | POA: Diagnosis not present

## 2017-08-01 DIAGNOSIS — N186 End stage renal disease: Secondary | ICD-10-CM | POA: Diagnosis not present

## 2017-08-01 DIAGNOSIS — E1129 Type 2 diabetes mellitus with other diabetic kidney complication: Secondary | ICD-10-CM | POA: Diagnosis not present

## 2017-08-01 DIAGNOSIS — N2581 Secondary hyperparathyroidism of renal origin: Secondary | ICD-10-CM | POA: Diagnosis not present

## 2017-08-03 DIAGNOSIS — N2581 Secondary hyperparathyroidism of renal origin: Secondary | ICD-10-CM | POA: Diagnosis not present

## 2017-08-03 DIAGNOSIS — N186 End stage renal disease: Secondary | ICD-10-CM | POA: Diagnosis not present

## 2017-08-03 DIAGNOSIS — E1129 Type 2 diabetes mellitus with other diabetic kidney complication: Secondary | ICD-10-CM | POA: Diagnosis not present

## 2017-08-03 DIAGNOSIS — D509 Iron deficiency anemia, unspecified: Secondary | ICD-10-CM | POA: Diagnosis not present

## 2017-08-05 DIAGNOSIS — E1129 Type 2 diabetes mellitus with other diabetic kidney complication: Secondary | ICD-10-CM | POA: Diagnosis not present

## 2017-08-05 DIAGNOSIS — D509 Iron deficiency anemia, unspecified: Secondary | ICD-10-CM | POA: Diagnosis not present

## 2017-08-05 DIAGNOSIS — N2581 Secondary hyperparathyroidism of renal origin: Secondary | ICD-10-CM | POA: Diagnosis not present

## 2017-08-05 DIAGNOSIS — N186 End stage renal disease: Secondary | ICD-10-CM | POA: Diagnosis not present

## 2017-08-08 DIAGNOSIS — N2581 Secondary hyperparathyroidism of renal origin: Secondary | ICD-10-CM | POA: Diagnosis not present

## 2017-08-08 DIAGNOSIS — D509 Iron deficiency anemia, unspecified: Secondary | ICD-10-CM | POA: Diagnosis not present

## 2017-08-08 DIAGNOSIS — E1129 Type 2 diabetes mellitus with other diabetic kidney complication: Secondary | ICD-10-CM | POA: Diagnosis not present

## 2017-08-08 DIAGNOSIS — N186 End stage renal disease: Secondary | ICD-10-CM | POA: Diagnosis not present

## 2017-08-10 DIAGNOSIS — N186 End stage renal disease: Secondary | ICD-10-CM | POA: Diagnosis not present

## 2017-08-10 DIAGNOSIS — N2581 Secondary hyperparathyroidism of renal origin: Secondary | ICD-10-CM | POA: Diagnosis not present

## 2017-08-10 DIAGNOSIS — E1129 Type 2 diabetes mellitus with other diabetic kidney complication: Secondary | ICD-10-CM | POA: Diagnosis not present

## 2017-08-10 DIAGNOSIS — D509 Iron deficiency anemia, unspecified: Secondary | ICD-10-CM | POA: Diagnosis not present

## 2017-08-15 DIAGNOSIS — D509 Iron deficiency anemia, unspecified: Secondary | ICD-10-CM | POA: Diagnosis not present

## 2017-08-15 DIAGNOSIS — N186 End stage renal disease: Secondary | ICD-10-CM | POA: Diagnosis not present

## 2017-08-15 DIAGNOSIS — N2581 Secondary hyperparathyroidism of renal origin: Secondary | ICD-10-CM | POA: Diagnosis not present

## 2017-08-15 DIAGNOSIS — E1129 Type 2 diabetes mellitus with other diabetic kidney complication: Secondary | ICD-10-CM | POA: Diagnosis not present

## 2017-08-17 DIAGNOSIS — N186 End stage renal disease: Secondary | ICD-10-CM | POA: Diagnosis not present

## 2017-08-17 DIAGNOSIS — D509 Iron deficiency anemia, unspecified: Secondary | ICD-10-CM | POA: Diagnosis not present

## 2017-08-17 DIAGNOSIS — E1129 Type 2 diabetes mellitus with other diabetic kidney complication: Secondary | ICD-10-CM | POA: Diagnosis not present

## 2017-08-17 DIAGNOSIS — N2581 Secondary hyperparathyroidism of renal origin: Secondary | ICD-10-CM | POA: Diagnosis not present

## 2017-08-19 DIAGNOSIS — N186 End stage renal disease: Secondary | ICD-10-CM | POA: Diagnosis not present

## 2017-08-19 DIAGNOSIS — D509 Iron deficiency anemia, unspecified: Secondary | ICD-10-CM | POA: Diagnosis not present

## 2017-08-19 DIAGNOSIS — N2581 Secondary hyperparathyroidism of renal origin: Secondary | ICD-10-CM | POA: Diagnosis not present

## 2017-08-19 DIAGNOSIS — E1129 Type 2 diabetes mellitus with other diabetic kidney complication: Secondary | ICD-10-CM | POA: Diagnosis not present

## 2017-08-20 DIAGNOSIS — E1129 Type 2 diabetes mellitus with other diabetic kidney complication: Secondary | ICD-10-CM | POA: Diagnosis not present

## 2017-08-20 DIAGNOSIS — Z992 Dependence on renal dialysis: Secondary | ICD-10-CM | POA: Diagnosis not present

## 2017-08-20 DIAGNOSIS — N186 End stage renal disease: Secondary | ICD-10-CM | POA: Diagnosis not present

## 2017-08-22 DIAGNOSIS — N186 End stage renal disease: Secondary | ICD-10-CM | POA: Diagnosis not present

## 2017-08-22 DIAGNOSIS — E1129 Type 2 diabetes mellitus with other diabetic kidney complication: Secondary | ICD-10-CM | POA: Diagnosis not present

## 2017-08-22 DIAGNOSIS — N2581 Secondary hyperparathyroidism of renal origin: Secondary | ICD-10-CM | POA: Diagnosis not present

## 2017-08-24 DIAGNOSIS — N2581 Secondary hyperparathyroidism of renal origin: Secondary | ICD-10-CM | POA: Diagnosis not present

## 2017-08-24 DIAGNOSIS — N186 End stage renal disease: Secondary | ICD-10-CM | POA: Diagnosis not present

## 2017-08-24 DIAGNOSIS — E1129 Type 2 diabetes mellitus with other diabetic kidney complication: Secondary | ICD-10-CM | POA: Diagnosis not present

## 2017-08-26 ENCOUNTER — Encounter (INDEPENDENT_AMBULATORY_CARE_PROVIDER_SITE_OTHER): Payer: Self-pay | Admitting: Orthopaedic Surgery

## 2017-08-26 ENCOUNTER — Ambulatory Visit (INDEPENDENT_AMBULATORY_CARE_PROVIDER_SITE_OTHER): Payer: Medicare Other | Admitting: Orthopaedic Surgery

## 2017-08-26 VITALS — BP 152/86 | HR 98 | Ht 72.0 in | Wt 219.0 lb

## 2017-08-26 DIAGNOSIS — G8929 Other chronic pain: Secondary | ICD-10-CM

## 2017-08-26 DIAGNOSIS — N2581 Secondary hyperparathyroidism of renal origin: Secondary | ICD-10-CM | POA: Diagnosis not present

## 2017-08-26 DIAGNOSIS — M25511 Pain in right shoulder: Secondary | ICD-10-CM | POA: Diagnosis not present

## 2017-08-26 DIAGNOSIS — N186 End stage renal disease: Secondary | ICD-10-CM | POA: Diagnosis not present

## 2017-08-26 DIAGNOSIS — E1129 Type 2 diabetes mellitus with other diabetic kidney complication: Secondary | ICD-10-CM | POA: Diagnosis not present

## 2017-08-26 MED ORDER — LIDOCAINE HCL 1 % IJ SOLN
2.0000 mL | INTRAMUSCULAR | Status: AC | PRN
  Administered 2017-08-26: 2 mL

## 2017-08-26 MED ORDER — BUPIVACAINE HCL 0.5 % IJ SOLN
2.0000 mL | INTRAMUSCULAR | Status: AC | PRN
  Administered 2017-08-26: 2 mL via INTRA_ARTICULAR

## 2017-08-26 MED ORDER — METHYLPREDNISOLONE ACETATE 40 MG/ML IJ SUSP
80.0000 mg | INTRAMUSCULAR | Status: AC | PRN
  Administered 2017-08-26: 80 mg

## 2017-08-26 NOTE — Progress Notes (Signed)
Office Visit Note   Patient: Howard Bell           Date of Birth: 1966-02-15           MRN: [id number] Visit Date: 08/26/2017              Requested by: Horald Pollen, MD [place], [id number] [id number] PCP: Joretta Bachelor, PA   Assessment & Plan: Visit Diagnoses:  1. 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  From a diagnostic and therapeutic standpoint we will try a intra-articular and subacromial cortisone injection and monitor his response over the next 2 weeks.  Patient is diabetic and will have to monitor his glucose  Follow-Up Instructions: Return in about 2 weeks (around 09/09/2017).   Orders:  Orders Placed This Encounter  Procedures  . Large Joint Inj: R glenohumeral   No orders of the defined types were placed in this encounter.     Procedures: Large Joint Inj: R glenohumeral on 08/26/2017 11:32 AM Indications: pain and diagnostic evaluation Details: 25 G 1.5 in needle, posterior approach  Arthrogram: No  Medications: 2 mL lidocaine 1 %; 2 mL bupivacaine 0.5 %; 80 mg methylPREDNISolone acetate 40 MG/ML Consent was given by the patient. Immediately prior to procedure a time out was called to verify the correct patient, procedure, equipment, support staff and site/side marked as required. Patient was prepped and draped in the usual sterile fashion.       Clinical Data: No additional findings.   Subjective: Chief Complaint  Patient presents with  . Right Shoulder - Pain  . New Patient (Initial Visit)    R SHOULDER PAIN 6 MO, NO INJURY, NO IJECTIONS OR SURGERIES HARD TO LIFT OVER HEAD AT TIMES AND SOME POPPING SOME MORNINGS  52 year old gentleman noted onset of right shoulder pain insidiously about 6 months ago.  He has had difficulty raising his arm over his head and sleeping on that side.  The shoulder will catch and pop and sometimes will  have limited range of motion.  He denies any obvious injury or trauma.  He is not experiencing neck pain.  Does not have any numbness or tingling.  He will have some pain along the lateral and anterior aspect of his shoulder. Has history of diabetes with peripheral vascular disease.  Also has end-stage renal disease. Had x-rays of his right shoulder performed April 1.  Interpretation was negative.  I thought he might have a little ectopic calcification along the inferior aspect of the humeral head possibly consistent with arthritis.  There were areas of degenerative change about the acromioclavicular joint  HPI  Review of Systems  Constitutional: Negative for fatigue and fever.  HENT: Negative for ear pain.   Eyes: Negative for pain.  Respiratory: Negative for cough and shortness of breath.   Cardiovascular: Negative for leg swelling.  Gastrointestinal: Negative for constipation and diarrhea.  Genitourinary: Positive for difficulty urinating.  Musculoskeletal: Negative for back pain and neck pain.  Skin: Negative for rash.  Allergic/Immunologic: Negative for food allergies.  Neurological: Positive for weakness and numbness.  Hematological: Bruises/bleeds easily.  Psychiatric/Behavioral: Positive for sleep disturbance.     Objective: Vital Signs: BP (!) 152/86 (BP Location: Right Arm, Patient Position: Sitting, Cuff Size: Normal)   Pulse 98   Ht 6' (1.829 m)   Wt 219 lb (99.3 kg)   BMI 29.70 kg/m   Physical Exam  Constitutional:  He is oriented to person, place, and time. He appears well-developed and well-nourished.  HENT:  Mouth/Throat: Oropharynx is clear and moist.  Eyes: Pupils are equal, round, and reactive to light. EOM are normal.  Pulmonary/Chest: Effort normal.  Neurological: He is alert and oriented to person, place, and time.  Skin: Skin is warm and dry.  Psychiatric: He has a normal mood and affect. His behavior is normal.    Ortho Exam awake alert and oriented x3  comfortable sitting.  Does have an unusual gait where he actually slaps his feet.  Positive impingement and empty can testing right shoulder.  Biceps intact.  Skin intact.  No pain at the acromioclavicular joint.  No pain in the anterior subacromial region.  Painful overhead arc of motion.  I was unable to place his arm fully over his head related to his pain.  Could have a mild adhesive capsulitis  Specialty Comments:  No specialty comments available.  Imaging: No results found.   PMFS History: Patient Active Problem List   Diagnosis Date Noted  . Acute pain of right shoulder 07/26/2017  . Acute bursitis of right shoulder 07/26/2017  . Hyperkalemia 03/28/2017  . Left arm swelling 03/28/2017  . ESRD (end stage renal disease) ([place]) 03/28/2017  . Hypertensive urgency   . Chest pain 11/26/2016  . Essential hypertension 11/26/2016  . ESRD needing dialysis ([place]) 08/04/2016  . Acute respiratory failure with hypoxemia ([place]) 08/04/2016  . Diabetes mellitus with complication ([place]) 69/45/0388  . Acute CHF (congestive heart failure) ([place]) 08/04/2016  . Hypertensive emergency 08/04/2016  . Elevated troponin    Past Medical History:  Diagnosis Date  . Congestive heart failure (CHF) ([place])   . 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 ESRD (end stage renal disease) on dialysis ([place])   . Hypertension     Family History  Problem Relation Age of Onset  . Diabetes Mother   . Hypertension Father     Past Surgical History:  Procedure Laterality Date  . HERNIA REPAIR    . IR AV DIALY SHUNT INTRO NEEDLE/INTRACATH INITIAL W/PTA/IMG LEFT  03/30/2017   Social History   Occupational History  . Not on file  Tobacco Use  . Smoking status: Never Smoker  . Smokeless tobacco: Never Used  Substance and Sexual Activity  . Alcohol use: No  . Drug use: No  . Sexual activity: Not on file

## 2017-08-29 DIAGNOSIS — E1129 Type 2 diabetes mellitus with other diabetic kidney complication: Secondary | ICD-10-CM | POA: Diagnosis not present

## 2017-08-29 DIAGNOSIS — N2581 Secondary hyperparathyroidism of renal origin: Secondary | ICD-10-CM | POA: Diagnosis not present

## 2017-08-29 DIAGNOSIS — N186 End stage renal disease: Secondary | ICD-10-CM | POA: Diagnosis not present

## 2017-08-31 DIAGNOSIS — E1129 Type 2 diabetes mellitus with other diabetic kidney complication: Secondary | ICD-10-CM | POA: Diagnosis not present

## 2017-08-31 DIAGNOSIS — N2581 Secondary hyperparathyroidism of renal origin: Secondary | ICD-10-CM | POA: Diagnosis not present

## 2017-08-31 DIAGNOSIS — N186 End stage renal disease: Secondary | ICD-10-CM | POA: Diagnosis not present

## 2017-09-02 DIAGNOSIS — E1129 Type 2 diabetes mellitus with other diabetic kidney complication: Secondary | ICD-10-CM | POA: Diagnosis not present

## 2017-09-02 DIAGNOSIS — N2581 Secondary hyperparathyroidism of renal origin: Secondary | ICD-10-CM | POA: Diagnosis not present

## 2017-09-02 DIAGNOSIS — N186 End stage renal disease: Secondary | ICD-10-CM | POA: Diagnosis not present

## 2017-09-05 DIAGNOSIS — E1129 Type 2 diabetes mellitus with other diabetic kidney complication: Secondary | ICD-10-CM | POA: Diagnosis not present

## 2017-09-05 DIAGNOSIS — N2581 Secondary hyperparathyroidism of renal origin: Secondary | ICD-10-CM | POA: Diagnosis not present

## 2017-09-05 DIAGNOSIS — N186 End stage renal disease: Secondary | ICD-10-CM | POA: Diagnosis not present

## 2017-09-07 DIAGNOSIS — N2581 Secondary hyperparathyroidism of renal origin: Secondary | ICD-10-CM | POA: Diagnosis not present

## 2017-09-07 DIAGNOSIS — N186 End stage renal disease: Secondary | ICD-10-CM | POA: Diagnosis not present

## 2017-09-07 DIAGNOSIS — E1129 Type 2 diabetes mellitus with other diabetic kidney complication: Secondary | ICD-10-CM | POA: Diagnosis not present

## 2017-09-09 ENCOUNTER — Emergency Department (HOSPITAL_COMMUNITY)
Admission: EM | Admit: 2017-09-09 | Discharge: 2017-09-10 | Disposition: A | Discharge status: Home / Self Care | Payer: Medicare Other | Attending: Emergency Medicine | Admitting: Emergency Medicine

## 2017-09-09 ENCOUNTER — Ambulatory Visit (INDEPENDENT_AMBULATORY_CARE_PROVIDER_SITE_OTHER): Payer: Medicare Other | Admitting: Orthopaedic Surgery

## 2017-09-09 ENCOUNTER — Other Ambulatory Visit: Payer: Self-pay

## 2017-09-09 ENCOUNTER — Encounter (HOSPITAL_COMMUNITY): Payer: Self-pay | Admitting: *Deleted

## 2017-09-09 DIAGNOSIS — E1122 Type 2 diabetes mellitus with diabetic chronic kidney disease: Secondary | ICD-10-CM | POA: Diagnosis not present

## 2017-09-09 DIAGNOSIS — I132 Hypertensive heart and chronic kidney disease with heart failure and with stage 5 chronic kidney disease, or end stage renal disease: Secondary | ICD-10-CM | POA: Diagnosis not present

## 2017-09-09 DIAGNOSIS — R109 Unspecified abdominal pain: Secondary | ICD-10-CM

## 2017-09-09 DIAGNOSIS — E1129 Type 2 diabetes mellitus with other diabetic kidney complication: Secondary | ICD-10-CM | POA: Diagnosis not present

## 2017-09-09 DIAGNOSIS — Z79899 Other long term (current) drug therapy: Secondary | ICD-10-CM | POA: Insufficient documentation

## 2017-09-09 DIAGNOSIS — N186 End stage renal disease: Secondary | ICD-10-CM | POA: Diagnosis not present

## 2017-09-09 DIAGNOSIS — R0602 Shortness of breath: Secondary | ICD-10-CM | POA: Diagnosis not present

## 2017-09-09 DIAGNOSIS — N2581 Secondary hyperparathyroidism of renal origin: Secondary | ICD-10-CM | POA: Diagnosis not present

## 2017-09-09 DIAGNOSIS — R112 Nausea with vomiting, unspecified: Secondary | ICD-10-CM | POA: Diagnosis not present

## 2017-09-09 DIAGNOSIS — R05 Cough: Secondary | ICD-10-CM | POA: Diagnosis not present

## 2017-09-09 DIAGNOSIS — R1084 Generalized abdominal pain: Secondary | ICD-10-CM | POA: Diagnosis not present

## 2017-09-09 DIAGNOSIS — Z794 Long term (current) use of insulin: Secondary | ICD-10-CM | POA: Insufficient documentation

## 2017-09-09 DIAGNOSIS — I509 Heart failure, unspecified: Secondary | ICD-10-CM | POA: Diagnosis not present

## 2017-09-09 DIAGNOSIS — K4090 Unilateral inguinal hernia, without obstruction or gangrene, not specified as recurrent: Secondary | ICD-10-CM | POA: Diagnosis not present

## 2017-09-09 LAB — COMPREHENSIVE METABOLIC PANEL
ALT: 16 U/L — ABNORMAL LOW (ref 17–63)
AST: 20 U/L (ref 15–41)
Albumin: 4 g/dL (ref 3.5–5.0)
Alkaline Phosphatase: 108 U/L (ref 38–126)
Anion gap: 16 — ABNORMAL HIGH (ref 5–15)
BUN: 26 mg/dL — ABNORMAL HIGH (ref 6–20)
CO2: 33 mmol/L — ABNORMAL HIGH (ref 22–32)
Calcium: 9.3 mg/dL (ref 8.9–10.3)
Chloride: 93 mmol/L — ABNORMAL LOW (ref 101–111)
Creatinine, Ser: 8.71 mg/dL — ABNORMAL HIGH (ref 0.61–1.24)
GFR calc Af Amer: 7 mL/min — ABNORMAL LOW (ref >60)
GFR calc non Af Amer: 6 mL/min — ABNORMAL LOW (ref >60)
Glucose, Bld: 136 mg/dL — ABNORMAL HIGH (ref 65–99)
Potassium: 3.8 mmol/L (ref 3.5–5.1)
Sodium: 142 mmol/L (ref 135–145)
Total Bilirubin: 1.1 mg/dL (ref 0.3–1.2)
Total Protein: 8.6 g/dL — ABNORMAL HIGH (ref 6.5–8.1)

## 2017-09-09 LAB — CBC
HCT: 45.1 % (ref 39.0–52.0)
Hemoglobin: 14.3 g/dL (ref 13.0–17.0)
MCH: 31.1 pg (ref 26.0–34.0)
MCHC: 31.7 g/dL (ref 30.0–36.0)
MCV: 98 fL (ref 78.0–100.0)
Platelets: 164 10*3/uL (ref 150–400)
RBC: 4.6 MIL/uL (ref 4.22–5.81)
RDW: 16.1 % — ABNORMAL HIGH (ref 11.5–15.5)
WBC: 7.3 10*3/uL (ref 4.0–10.5)

## 2017-09-09 LAB — LIPASE, BLOOD: Lipase: 42 U/L (ref 11–51)

## 2017-09-09 NOTE — ED Triage Notes (Signed)
No answer when called 

## 2017-09-09 NOTE — ED Triage Notes (Signed)
The pt is c/o abd pain with vomiting since tyhis am  One time he vomited he  Had a small amount of  Dark colored blood  Dialysis pt  Fistula  Had fistula lt arm  Dialyzed today

## 2017-09-09 NOTE — ED Provider Notes (Signed)
Patient placed in Quick Look pathway, seen and evaluated   Chief Complaint: nausea and vomiting   HPI:   1 hour history of generalized abdominal pain n/v- no fever   ROS: abd pain  (one)  Physical Exam:   Gen: No distress  Neuro: Awake and Alert  Skin: Warm    Focused Exam: generalized abd TTP, non focal    Initiation of care has begun. The patient has been counseled on the process, plan, and necessity for staying for the completion/evaluation, and the remainder of the medical screening examination    Francee Gentile 09/09/17 2016    Jola Schmidt, MD 09/10/17 [phone]

## 2017-09-10 ENCOUNTER — Emergency Department (HOSPITAL_COMMUNITY): Payer: Medicare Other

## 2017-09-10 DIAGNOSIS — K4090 Unilateral inguinal hernia, without obstruction or gangrene, not specified as recurrent: Secondary | ICD-10-CM | POA: Diagnosis not present

## 2017-09-10 DIAGNOSIS — R0602 Shortness of breath: Secondary | ICD-10-CM | POA: Diagnosis not present

## 2017-09-10 DIAGNOSIS — R05 Cough: Secondary | ICD-10-CM | POA: Diagnosis not present

## 2017-09-10 MED ORDER — ONDANSETRON 8 MG PO TBDP: 8.0000 mg | ORAL_TABLET | Freq: Three times a day (TID) | ORAL | 0 refills | Status: DC | PRN

## 2017-09-10 MED ORDER — IOHEXOL 300 MG/ML  SOLN
100.0000 mL | Freq: Once | INTRAMUSCULAR | Status: AC | PRN
  Administered 2017-09-10: 100 mL via INTRAVENOUS

## 2017-09-10 MED ORDER — DICYCLOMINE HCL 20 MG PO TABS: 20.0000 mg | ORAL_TABLET | Freq: Two times a day (BID) | ORAL | 0 refills | Status: DC

## 2017-09-10 MED ORDER — HYDROMORPHONE HCL 1 MG/ML IJ SOLN
1.0000 mg | Freq: Once | INTRAMUSCULAR | Status: AC
  Administered 2017-09-10: 1 mg via INTRAVENOUS
  Filled 2017-09-10: qty 1

## 2017-09-10 NOTE — ED Provider Notes (Signed)
[facility] EMERGENCY DEPARTMENT Provider Note   CSN: [id number] Arrival date & time: 09/09/17  1955     History   Chief Complaint Chief Complaint  Patient presents with  . Abdominal Pain    HPI Howard Bell is a 52 y.o. male.  HPI Patient is a 52 year old male with a history of end-stage renal disease on dialysis Monday Wednesday Friday presents to the emergency department with abdominal pain nausea vomiting which began this evening.  No diarrhea.  No fevers or chills.  No recent sick contacts.  He underwent a normal run of dialysis today without complicating features.  A small amount of blood in the second episode of vomit.  No vomiting since then.  No vomiting in the emergency department.  No fevers.  Does not make urine any longer no chest pain or shortness of breath.  No back pain.  Symptoms are moderate in severity   Past Medical History:  Diagnosis Date  . Congestive heart failure (CHF) ([place])   . 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 ESRD (end stage renal disease) on dialysis ([place])   . Hypertension     Patient Active Problem List   Diagnosis Date Noted  . Acute pain of right shoulder 07/26/2017  . Acute bursitis of right shoulder 07/26/2017  . Hyperkalemia 03/28/2017  . Left arm swelling 03/28/2017  . ESRD (end stage renal disease) ([place]) 03/28/2017  . Hypertensive urgency   . Chest pain 11/26/2016  . Essential hypertension 11/26/2016  . ESRD needing dialysis ([place]) 08/04/2016  . Acute respiratory failure with hypoxemia ([place]) 08/04/2016  . Diabetes mellitus with complication ([place]) 61/44/3154  . Acute CHF (congestive heart failure) ([employer]) 08/04/2016  . Hypertensive emergency 08/04/2016  . Elevated troponin     Past Surgical History:  Procedure Laterality Date  . HERNIA REPAIR    . IR AV DIALY SHUNT INTRO NEEDLE/INTRACATH INITIAL W/PTA/IMG LEFT  03/30/2017        Home Medications    Prior to Admission  medications   Medication Sig Start Date End Date Taking? Authorizing Provider  acetaminophen (TYLENOL) 500 MG tablet Take 1 tablet (500 mg total) by mouth every 8 (eight) hours as needed. Patient not taking: Reported on 08/26/2017 06/20/17   Hedges, Dellis Filbert, PA-C  amLODipine (NORVASC) 10 MG tablet Take 1 tablet (10 mg total) by mouth daily. 03/30/17   Hongalgi, Lenis Dickinson, MD  amoxicillin-clavulanate (AUGMENTIN) 875-125 MG tablet Take 1 tablet by mouth 2 (two) times daily. Patient not taking: Reported on 07/26/2017 06/28/17   Landis Martins, DPM  brimonidine (ALPHAGAN) 0.2 % ophthalmic solution Place 1 drop into both eyes 2 (two) times daily.    [provider]  diclofenac sodium (VOLTAREN) 1 % GEL Apply 4 g topically 4 (four) times daily as needed (pain). Patient not taking: Reported on 07/26/2017 03/30/17   Modena Jansky, MD  doxycycline (VIBRAMYCIN) 100 MG capsule Take 1 capsule (100 mg total) by mouth 2 (two) times daily. Patient not taking: Reported on 07/26/2017 05/07/17   Ivar Drape D, PA  HYDROcodone-acetaminophen (NORCO) 5-325 MG tablet Take 1 tablet by mouth every 6 (six) hours as needed for moderate pain. 07/26/17   Horald Pollen, MD  insulin lispro (HUMALOG KWIKPEN) 100 UNIT/ML KiwkPen Inject 0-0.09 mLs (0-9 Units total) into the skin 3 (three) times daily with meals. CBG < 70: Eat or drink something sweet and recheck. CBG 70 - 120: 0 units CBG 121 - 150: 1  unit CBG 151 - 200: 2 units CBG 201 - 250: 3 units CBG 251 - 300: 5 units CBG 301 - 350: 7 units CBG 351 - 400: 9 units CBG > 400: call MD. 03/30/17   Modena Jansky, MD  lidocaine (LIDODERM) 5 % Place 1 patch onto the skin daily. Remove & Discard patch within 12 hours or as directed by MD Patient not taking: Reported on 08/26/2017 06/20/17   Hedges, Dellis Filbert, PA-C  metoprolol succinate (TOPROL-XL) 100 MG 24 hr tablet Take 1 tablet (100 mg total) by mouth daily. Take with or immediately following a meal. 03/30/17   Hongalgi,  Lenis Dickinson, MD  ondansetron (ZOFRAN ODT) 8 MG disintegrating tablet Take 1 tablet (8 mg total) by mouth every 8 (eight) hours as needed for nausea or vomiting. 09/10/17   Jola Schmidt, MD  pregabalin (LYRICA) 50 MG capsule Take 1 capsule (50 mg total) by mouth daily. 05/07/17   Ivar Drape D, PA  timolol (BETIMOL) 0.5 % ophthalmic solution Place 1 drop into both eyes 2 (two) times daily. Patient not taking: Reported on 08/26/2017 05/07/17   Ivar Drape D, PA  travoprost, benzalkonium, (TRAVATAN) 0.004 % ophthalmic solution Place 1 drop into both eyes at bedtime.    [provider]    Family History Family History  Problem Relation Age of Onset  . Diabetes Mother   . Hypertension Father     Social History Social History   Tobacco Use  . Smoking status: Never Smoker  . Smokeless tobacco: Never Used  Substance Use Topics  . Alcohol use: No  . Drug use: No     Allergies   Patient has no known allergies.   Review of Systems Review of Systems  All other systems reviewed and are negative.    Physical Exam Updated Vital Signs BP (!) 169/95   Pulse 88   Temp 98.3 F (36.8 C) (Oral)   Resp 18   Ht 6' (1.829 m)   Wt 99.8 kg (220 lb)   SpO2 100%   BMI 29.84 kg/m   Physical Exam  Constitutional: He is oriented to person, place, and time. He appears well-developed and well-nourished.  HENT:  Head: Normocephalic and atraumatic.  Eyes: EOM are normal.  Neck: Normal range of motion.  Cardiovascular: Normal rate, regular rhythm, normal heart sounds and intact distal pulses.  Pulmonary/Chest: Effort normal and breath sounds normal. No respiratory distress.  Abdominal: Soft. He exhibits no distension.  Mild left-sided abdominal tenderness without guarding or rebound.  No peritoneal signs.  Musculoskeletal: Normal range of motion.  Neurological: He is alert and oriented to person, place, and time.  Skin: Skin is warm and dry.  Psychiatric: He has a normal  mood and affect. Judgment normal.  Nursing note and vitals reviewed.    ED Treatments / Results  Labs (all labs ordered are listed, but only abnormal results are displayed) Labs Reviewed  COMPREHENSIVE METABOLIC PANEL - Abnormal; Notable for the following components:      Result Value   Chloride 93 (*)    CO2 33 (*)    Glucose, Bld 136 (*)    BUN 26 (*)    Creatinine, Ser 8.71 (*)    Total Protein 8.6 (*)    ALT 16 (*)    GFR calc non Af Amer 6 (*)    GFR calc Af Amer 7 (*)    Anion gap 16 (*)    All other components within normal limits  CBC -  Abnormal; Notable for the following components:   RDW 16.1 (*)    All other components within normal limits  LIPASE, BLOOD    EKG None  Radiology Dg Chest 2 View  Result Date: 09/10/2017 CLINICAL DATA:  Cough and shortness of breath.  Low oxygen rate. EXAM: CHEST - 2 VIEW COMPARISON:  03/28/2017 FINDINGS: Shallow inspiration. Cardiac enlargement. No vascular congestion, edema, or consolidation. No blunting of costophrenic angles. No pneumothorax. Mediastinal contours appear intact. IMPRESSION: Cardiac enlargement.  No evidence of active pulmonary disease. Electronically Signed   By: Lucienne Capers M.D.   On: 09/10/2017 02:39   Ct Abdomen Pelvis W Contrast  Result Date: 09/10/2017 CLINICAL DATA:  Abdominal pain and vomiting this morning. Small amount of blood. EXAM: CT ABDOMEN AND PELVIS WITH CONTRAST TECHNIQUE: Multidetector CT imaging of the abdomen and pelvis was performed using the standard protocol following bolus administration of intravenous contrast. CONTRAST:  166mL OMNIPAQUE IOHEXOL 300 MG/ML  SOLN COMPARISON:  11/18/2016 FINDINGS: Lower chest: Motion artifact limits examination. There is hazy parenchymal density bilaterally which could be due to edema or air trapping. Superimposed consolidation in the right lung base may represent superimposed pneumonia. No pleural effusion. Hepatobiliary: Gallbladder wall appears thickened  edematous. No stones are seen. Gallbladder wall thickening can be associated with cholecystitis, liver disease, heart disease, or hypoproteinemia. No bile duct dilatation. No focal liver lesions. Pancreas: Unremarkable. No pancreatic ductal dilatation or surrounding inflammatory changes. Spleen: Normal in size without focal abnormality. Adrenals/Urinary Tract: 13 mm left adrenal gland nodule is unchanged in size since previous study. Subcentimeter renal cysts bilaterally. Delayed renal nephrograms bilaterally consistent with chronic renal dysfunction. No hydronephrosis or hydroureter. No stones identified. Bladder is decompressed. Stomach/Bowel: Stomach, small bowel, and colon are not abnormally distended. No inflammatory changes identified. Appendix is normal. Vascular/Lymphatic: Aortic atherosclerosis. No enlarged abdominal or pelvic lymph nodes. Reproductive: Prostate gland is not enlarged. Other: No free air or free fluid in the abdomen. Small bilateral inguinal hernias containing fat. Moderate periumbilical hernia containing fat. There is collateral flow to multiple vessels in the right chest and abdominal wall. This degree of collateral flow coming from the contrast injection could indicate subclavian or SVC stenosis. These areas are not included within the field of view for evaluation. 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 No destructive bone lesions. IMPRESSION: 1. No cause identified for abdominal pain and vomiting. No evidence of bowel obstruction or inflammation. 2. Gallbladder wall thickening without stone identified. This could be associated with cholecystitis, liver disease, heart disease, or hypoproteinemia. 3. 13 mm left adrenal gland nodule is unchanged since prior study. 4. Delayed renal nephrograms consistent with history of renal failure. 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 5. Small bilateral inguinal  hernias containing fat. Moderate periumbilical hernia containing fat. 6. Multiple enhancing collateral vessels in the right chest and abdominal wall suggesting subclavian or SVC stenosis. 7. Aortic atherosclerosis. Electronically Signed   By: Lucienne Capers M.D.   On: 09/10/2017 03:09    Procedures Procedures (including critical care time)  Medications Ordered in ED Medications  HYDROmorphone (DILAUDID) injection 1 mg (1 mg Intravenous Given 09/10/17 0200)  iohexol (OMNIPAQUE) 300 MG/ML solution 100 mL (100 mLs Intravenous Contrast Given 09/10/17 0242)     Initial Impression / Assessment and Plan / ED Course  I have reviewed the triage vital signs and the nursing notes.  Pertinent labs & imaging results that were available during my care of the patient were  reviewed by me and considered in my medical decision making (see chart for details).     Left-sided abdominal tenderness however CT imaging without acute pathology found.  Patient symptoms have improved in the emergency department.  No vomiting in the emergency department.  Vital signs are stable.  Discharged home with Zofran.  May represent viral illness.  Patient and family understand to return to the emergency department for new or worsening symptoms.  Final Clinical Impressions(s) / ED Diagnoses   Final diagnoses:  Acute abdominal pain    ED Discharge Orders        Ordered    ondansetron (ZOFRAN ODT) 8 MG disintegrating tablet  Every 8 hours PRN     09/10/17 0555       Jola Schmidt, MD 09/10/17 0602

## 2017-09-12 DIAGNOSIS — N186 End stage renal disease: Secondary | ICD-10-CM | POA: Diagnosis not present

## 2017-09-12 DIAGNOSIS — N2581 Secondary hyperparathyroidism of renal origin: Secondary | ICD-10-CM | POA: Diagnosis not present

## 2017-09-12 DIAGNOSIS — E1129 Type 2 diabetes mellitus with other diabetic kidney complication: Secondary | ICD-10-CM | POA: Diagnosis not present

## 2017-09-13 ENCOUNTER — Ambulatory Visit (INDEPENDENT_AMBULATORY_CARE_PROVIDER_SITE_OTHER): Payer: Medicare Other

## 2017-09-13 ENCOUNTER — Other Ambulatory Visit: Payer: Self-pay

## 2017-09-13 ENCOUNTER — Encounter: Payer: Self-pay | Admitting: Emergency Medicine

## 2017-09-13 ENCOUNTER — Ambulatory Visit (INDEPENDENT_AMBULATORY_CARE_PROVIDER_SITE_OTHER): Payer: Medicare Other | Admitting: Emergency Medicine

## 2017-09-13 VITALS — BP 160/80 | HR 96 | Temp 98.0°F | Resp 16 | Ht 72.25 in | Wt 223.4 lb

## 2017-09-13 DIAGNOSIS — R1084 Generalized abdominal pain: Secondary | ICD-10-CM

## 2017-09-13 DIAGNOSIS — Z992 Dependence on renal dialysis: Secondary | ICD-10-CM

## 2017-09-13 DIAGNOSIS — E118 Type 2 diabetes mellitus with unspecified complications: Secondary | ICD-10-CM

## 2017-09-13 DIAGNOSIS — N186 End stage renal disease: Secondary | ICD-10-CM

## 2017-09-13 NOTE — Assessment & Plan Note (Signed)
Probably multifactorial in this 52 year old male with diabetes and end-stage renal disease on dialysis.  Diabetic gastroparesis, ischemic bowel disease, GERD, peptic ulcer disease, among the few possibilities.  No red flag signs or symptoms.  Advised to start medications as prescribed in the emergency department.  Advised to monitor symptoms and return to the emergency room if no symptoms or worse.  Refer to GI for further evaluation.  Follow-up here afterwards.

## 2017-09-13 NOTE — Patient Instructions (Addendum)
     IF you received an x-ray today, you will receive an invoice from [facility]. Please contact [facility] at [phone] with questions or concerns regarding your invoice.   IF you received labwork today, you will receive an invoice from [facility]. Please contact [facility] at [phone] with questions or concerns regarding your invoice.   Our billing staff will not be able to assist you with questions regarding bills from these companies.  You will be contacted with the lab results as soon as they are available. The fastest way to get your results is to activate your My Chart account. Instructions are located on the last page of this paperwork. If you have not heard from us regarding the results in 2 weeks, please contact this office.      Abdominal Pain, Adult Many things can cause belly (abdominal) pain. Most times, belly pain is not dangerous. Many cases of belly pain can be watched and treated at home. Sometimes belly pain is serious, though. Your doctor will try to find the cause of your belly pain. Follow these instructions at home:  Take over-the-counter and prescription medicines only as told by your doctor. Do not take medicines that help you poop (laxatives) unless told to by your doctor.  Drink enough fluid to keep your pee (urine) clear or pale yellow.  Watch your belly pain for any changes.  Keep all follow-up visits as told by your doctor. This is important. Contact a doctor if:  Your belly pain changes or gets worse.  You are not hungry, or you lose weight without trying.  You are having trouble pooping (constipated) or have watery poop (diarrhea) for more than 2-3 days.  You have pain when you pee or poop.  Your belly pain wakes you up at night.  Your pain gets worse with meals, after eating, or with certain foods.  You are throwing up and cannot keep anything down.  You have a fever. Get help right away if:  Your pain does not go  away as soon as your doctor says it should.  You cannot stop throwing up.  Your pain is only in areas of your belly, such as the right side or the left lower part of the belly.  You have bloody or black poop, or poop that looks like tar.  You have very bad pain, cramping, or bloating in your belly.  You have signs of not having enough fluid or water in your body (dehydration), such as: ? Dark pee, very little pee, or no pee. ? Cracked lips. ? Dry mouth. ? Sunken eyes. ? Sleepiness. ? Weakness. This information is not intended to replace advice given to you by your health care provider. Make sure you discuss any questions you have with your health care provider. Document Released: 08/25/2007 Document Revised: 09/26/2015 Document Reviewed: 08/20/2015 [employer] Interactive Patient Education  2018 [employer]  

## 2017-09-13 NOTE — Progress Notes (Signed)
Howard Bell 52 y.o.   Chief Complaint  Patient presents with  . Abdominal Pain    x 3 weeks and congestion - per patient in the throat x 3 weeks  . Emesis    per patient 2-3 times    HISTORY OF PRESENT ILLNESS: This is a 52 y.o. male diabetic with a history of end-stage renal disease, last dialysis yesterday, recently seen in the emergency room 09/09/2017 for acute abdominal pain.  Had unremarkable work-up showing no acute pathology.  Sent home on Bentyl and Zofran.  Patient has not been able to pick up medication due to financial reasons.  Still complaining of upper abdominal pain and nausea.  Vomited once yesterday during dialysis.  Abdominal pain waxes and wanes.  No hematemesis or rectal bleeding.  Not eating much.  Poor appetite.  HPI   Prior to Admission medications   Medication Sig Start Date End Date Taking? Authorizing Provider  amLODipine (NORVASC) 10 MG tablet Take 1 tablet (10 mg total) by mouth daily. 03/30/17  Yes Hongalgi, Lenis Dickinson, MD  brimonidine (ALPHAGAN) 0.2 % ophthalmic solution Place 1 drop into both eyes 2 (two) times daily.   Yes [provider]  insulin lispro (HUMALOG KWIKPEN) 100 UNIT/ML KiwkPen Inject 0-0.09 mLs (0-9 Units total) into the skin 3 (three) times daily with meals. CBG < 70: Eat or drink something sweet and recheck. CBG 70 - 120: 0 units CBG 121 - 150: 1 unit CBG 151 - 200: 2 units CBG 201 - 250: 3 units CBG 251 - 300: 5 units CBG 301 - 350: 7 units CBG 351 - 400: 9 units CBG > 400: call MD. 03/30/17  Yes Hongalgi, Lenis Dickinson, MD  metoprolol succinate (TOPROL-XL) 100 MG 24 hr tablet Take 1 tablet (100 mg total) by mouth daily. Take with or immediately following a meal. 03/30/17  Yes Hongalgi, Lenis Dickinson, MD  timolol (BETIMOL) 0.5 % ophthalmic solution Place 1 drop into both eyes 2 (two) times daily. 05/07/17  Yes English, Colletta Maryland D, PA  travoprost, benzalkonium, (TRAVATAN) 0.004 % ophthalmic solution Place 1 drop into both eyes at bedtime.    Yes [provider]  acetaminophen (TYLENOL) 500 MG tablet Take 1 tablet (500 mg total) by mouth every 8 (eight) hours as needed. Patient not taking: Reported on 08/26/2017 06/20/17   Okey Regal, PA-C  diclofenac sodium (VOLTAREN) 1 % GEL Apply 4 g topically 4 (four) times daily as needed (pain). Patient not taking: Reported on 07/26/2017 03/30/17   Modena Jansky, MD  dicyclomine (BENTYL) 20 MG tablet Take 1 tablet (20 mg total) by mouth 2 (two) times daily. Patient not taking: Reported on 09/13/2017 09/10/17   Jola Schmidt, MD  doxycycline (VIBRAMYCIN) 100 MG capsule Take 1 capsule (100 mg total) by mouth 2 (two) times daily. Patient not taking: Reported on 07/26/2017 05/07/17   Ivar Drape D, PA  HYDROcodone-acetaminophen (NORCO) 5-325 MG tablet Take 1 tablet by mouth every 6 (six) hours as needed for moderate pain. Patient not taking: Reported on 09/13/2017 07/26/17   Horald Pollen, MD  lidocaine (LIDODERM) 5 % Place 1 patch onto the skin daily. Remove & Discard patch within 12 hours or as directed by MD Patient not taking: Reported on 08/26/2017 06/20/17   Hedges, Dellis Filbert, PA-C  ondansetron (ZOFRAN ODT) 8 MG disintegrating tablet Take 1 tablet (8 mg total) by mouth every 8 (eight) hours as needed for nausea or vomiting. Patient not taking: Reported on 09/13/2017 09/10/17   [place],  Lennette Bihari, MD  pregabalin (LYRICA) 50 MG capsule Take 1 capsule (50 mg total) by mouth daily. Patient not taking: Reported on 09/13/2017 05/07/17   Ivar Drape D, PA    No Known Allergies  Patient Active Problem List   Diagnosis Date Noted  . Acute pain of right shoulder 07/26/2017  . Acute bursitis of right shoulder 07/26/2017  . Hyperkalemia 03/28/2017  . Left arm swelling 03/28/2017  . ESRD (end stage renal disease) ([place]) 03/28/2017  . Hypertensive urgency   . Chest pain 11/26/2016  . Essential hypertension 11/26/2016  . ESRD needing dialysis ([place]) 08/04/2016  . Acute respiratory failure  with hypoxemia ([place]) 08/04/2016  . Diabetes mellitus with complication ([place]) 85/04/7739  . Acute CHF (congestive heart failure) ([place]) 08/04/2016  . Hypertensive emergency 08/04/2016  . Elevated troponin     Past Medical History:  Diagnosis Date  . Congestive heart failure (CHF) ([place])   . 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 ESRD (end stage renal disease) on dialysis ([place])   . Hypertension     Past Surgical History:  Procedure Laterality Date  . HERNIA REPAIR    . IR AV DIALY SHUNT INTRO NEEDLE/INTRACATH INITIAL W/PTA/IMG LEFT  03/30/2017    Social History   Socioeconomic History  . Marital status: Married    Spouse name: Not on file  . Number of children: Not on file  . Years of education: Not on file  . Highest education level: Not on file  Occupational History  . Not on file  Social Needs  . Financial resource strain: Not on file  . Food insecurity:    Worry: Not on file    Inability: Not on file  . Transportation needs:    Medical: Not on file    Non-medical: Not on file  Tobacco Use  . Smoking status: Never Smoker  . Smokeless tobacco: Never Used  Substance and Sexual Activity  . Alcohol use: No  . Drug use: No  . Sexual activity: Not on file  Lifestyle  . Physical activity:    Days per week: Not on file    Minutes per session: Not on file  . Stress: Not on file  Relationships  . Social connections:    Talks on phone: Not on file    Gets together: Not on file    Attends religious service: Not on file    Active member of club or organization: Not on file    Attends meetings of clubs or organizations: Not on file    Relationship status: Not on file  . Intimate partner violence:    Fear of current or ex partner: Not on file    Emotionally abused: Not on file    Physically abused: Not on file    Forced sexual activity: Not on file  Other Topics Concern  . Not on file  Social History Narrative  . Not on file    Family  History  Problem Relation Age of Onset  . Diabetes Mother   . Hypertension Father      Review of Systems  Constitutional: Positive for malaise/fatigue. Negative for chills and fever.  HENT: Negative.  Negative for hearing loss and sore throat.   Eyes: Negative.   Respiratory: Negative.  Negative for cough and shortness of breath.   Cardiovascular: Negative.  Negative for chest pain and palpitations.  Gastrointestinal: Positive for abdominal pain, nausea and vomiting. Negative for blood in stool, diarrhea and melena.  Genitourinary: Negative.  Negative for dysuria and hematuria.  Musculoskeletal: Negative.   Skin: Negative for rash.  Neurological: Negative.  Negative for dizziness and headaches.  Endo/Heme/Allergies: Negative.   All other systems reviewed and are negative.   Vitals:   09/13/17 1137  BP: (!) 160/80  Pulse: 96  Resp: 16  Temp: 98 F (36.7 C)  SpO2: 94%    Physical Exam  Constitutional: He is oriented to person, place, and time. He appears well-developed.  Chronically ill looking  HENT:  Head: Normocephalic and atraumatic.  Eyes: Pupils are equal, round, and reactive to light. EOM are normal.  Neck: Normal range of motion. Neck supple.  Cardiovascular: Normal rate, regular rhythm and normal heart sounds.  Pulmonary/Chest: Effort normal and breath sounds normal. No respiratory distress.  Abdominal: He exhibits distension. There is no tenderness. There is no rebound and no guarding.  Musculoskeletal: Normal range of motion.  Neurological: He is alert and oriented to person, place, and time.  Skin: Skin is warm and dry. Capillary refill takes less than 2 seconds.  Psychiatric: He has a normal mood and affect. His behavior is normal.  Vitals reviewed.  Dg Abd Acute W/chest  Result Date: 09/13/2017 CLINICAL DATA:  Generalized abdominal pain. EXAM: DG ABDOMEN ACUTE W/ 1V CHEST COMPARISON:  CT 09/10/2017. FINDINGS: Cardiomegaly with normal pulmonary  vascularity. Mild right base infiltrate. Soft tissue structures of the abdomen are unremarkable. Several air-filled loops of small bowel are noted with minimal thickening of small-bowel wall. A primary small-bowel process including enteritis or ischemia could present in this fashion. Aortoiliac and visceral atherosclerotic vascular calcification. No acute bony abnormality. IMPRESSION: 1. Right base infiltrate suggesting pneumonia. Similar findings noted on prior CT. 2.  Cardiomegaly.  No pulmonary venous congestion. 3. Several air-filled loops of small bowel with slight small bowel wall thickening noted. A primary small bowel process including enteritis or ischemia could present in this fashion. 4.  Aortoiliac and visceral atherosclerotic vascular disease. Electronically Signed   By: Marcello Moores  Register   On: 09/13/2017 12:27   No clinical signs or symptoms of pneumonia. Generalized abdominal pain Probably multifactorial in this 52 year old male with diabetes and end-stage renal disease on dialysis.  Diabetic gastroparesis, ischemic bowel disease, GERD, peptic ulcer disease, among the few possibilities.  No red flag signs or symptoms.  Advised to start medications as prescribed in the emergency department.  Advised to monitor symptoms and return to the emergency room if no symptoms or worse.  Refer to GI for further evaluation.  Follow-up here afterwards.     ASSESSMENT & PLAN: Howard Bell was seen today for abdominal pain and emesis.  Diagnoses and all orders for this visit:  Generalized abdominal pain -     DG Abd Acute W/Chest; Future -     Ambulatory referral to Gastroenterology  ESRD on dialysis [facility])  Diabetes mellitus with complication [facility])    Patient Instructions       IF you received an x-ray today, you will receive an invoice from [facility] Rad[facility]. Please contact [facility] at [phone] with questions or concerns regarding your invoice.   IF you received labwork  today, you will receive an invoice from [place]. Please contact [facility] at [phone] with questions or concerns regarding your invoice.   Our billing staff will not be able to assist you with questions regarding bills from these companies.  You will be contacted with the lab results as soon as they are available. The fastest way to get your results  is to activate your My Chart account. Instructions are located on the last page of this paperwork. If you have not heard from [place] regarding the results in 2 weeks, please contact this office.     Abdominal Pain, Adult Many things can cause belly (abdominal) pain. Most times, belly pain is not dangerous. Many cases of belly pain can be watched and treated at home. Sometimes belly pain is serious, though. Your doctor will try to find the cause of your belly pain. Follow these instructions at home:  Take over-the-counter and prescription medicines only as told by your doctor. Do not take medicines that help you poop (laxatives) unless told to by your doctor.  Drink enough fluid to keep your pee (urine) clear or pale yellow.  Watch your belly pain for any changes.  Keep all follow-up visits as told by your doctor. This is important. Contact a doctor if:  Your belly pain changes or gets worse.  You are not hungry, or you lose weight without trying.  You are having trouble pooping (constipated) or have watery poop (diarrhea) for more than 2-3 days.  You have pain when you pee or poop.  Your belly pain wakes you up at night.  Your pain gets worse with meals, after eating, or with certain foods.  You are throwing up and cannot keep anything down.  You have a fever. Get help right away if:  Your pain does not go away as soon as your doctor says it should.  You cannot stop throwing up.  Your pain is only in areas of your belly, such as the right side or the left lower part of the belly.  You have bloody or black poop, or poop that  looks like tar.  You have very bad pain, cramping, or bloating in your belly.  You have signs of not having enough fluid or water in your body (dehydration), such as: ? Dark pee, very little pee, or no pee. ? Cracked lips. ? Dry mouth. ? Sunken eyes. ? Sleepiness. ? Weakness. This information is not intended to replace advice given to you by your health care provider. Make sure you discuss any questions you have with your health care provider. Document Released: 08/25/2007 Document Revised: 09/26/2015 Document Reviewed: 08/20/2015 Elsevier Interactive Patient Education  2018 Elsevier Inc.      Agustina Caroli, MD [facility]

## 2017-09-16 DIAGNOSIS — N2581 Secondary hyperparathyroidism of renal origin: Secondary | ICD-10-CM | POA: Diagnosis not present

## 2017-09-16 DIAGNOSIS — N186 End stage renal disease: Secondary | ICD-10-CM | POA: Diagnosis not present

## 2017-09-16 DIAGNOSIS — E1129 Type 2 diabetes mellitus with other diabetic kidney complication: Secondary | ICD-10-CM | POA: Diagnosis not present

## 2017-09-16 DIAGNOSIS — Z992 Dependence on renal dialysis: Secondary | ICD-10-CM | POA: Diagnosis not present

## 2017-09-16 DIAGNOSIS — I871 Compression of vein: Secondary | ICD-10-CM | POA: Diagnosis not present

## 2017-09-16 DIAGNOSIS — T82858A Stenosis of vascular prosthetic devices, implants and grafts, initial encounter: Secondary | ICD-10-CM | POA: Diagnosis not present

## 2017-09-19 ENCOUNTER — Ambulatory Visit: Payer: Self-pay | Admitting: *Deleted

## 2017-09-19 DIAGNOSIS — E1129 Type 2 diabetes mellitus with other diabetic kidney complication: Secondary | ICD-10-CM | POA: Diagnosis not present

## 2017-09-19 DIAGNOSIS — Z992 Dependence on renal dialysis: Secondary | ICD-10-CM | POA: Diagnosis not present

## 2017-09-19 DIAGNOSIS — N2581 Secondary hyperparathyroidism of renal origin: Secondary | ICD-10-CM | POA: Diagnosis not present

## 2017-09-19 DIAGNOSIS — N186 End stage renal disease: Secondary | ICD-10-CM | POA: Diagnosis not present

## 2017-09-19 DIAGNOSIS — D509 Iron deficiency anemia, unspecified: Secondary | ICD-10-CM | POA: Diagnosis not present

## 2017-09-19 DIAGNOSIS — I159 Secondary hypertension, unspecified: Secondary | ICD-10-CM | POA: Diagnosis not present

## 2017-09-19 NOTE — Telephone Encounter (Signed)
Pt calling with complaints of an episode of vomiting that was mixed with blood today around 6:30pm today. Pt states that he only had one episode of emesis today that was yellow and gray that was mixed in with dark colored blood that was less than a cup. Pt also having complaints of sore throat. Pt states he also had 3 episodes of emesis over the weekend.Pt states he has been able to tolerate some solid food.  Pt states he did go for Dialysis today as well and states he was told that his blood was dark red at that time as well. Pt also having complaints of abdominal pain that he is currently rating at 9. Pt denying any dizziness at this time but states he did experience some dizziness when he was vomiting. Pt was seen on 6/25 with Dr. Franky Macho for abdominal pain and vomiting. Pt scheduled for appt on tomorrow. Pt advised if symptoms worsen before appt on tomorrow to seek treatment in the ED. Pt verbalized understanding.   Reason for Disposition . [1] Few streaks of blood in vomit AND [2] occurred one time  Answer Assessment - Initial Assessment Questions 1. APPEARANCE of BLOOD: "What does the blood look like?" (e.g., color, coffee-grounds)     Dark red 2. AMOUNT: "How much blood was lost?"     Less than a cup 3. VOMITING BLOOD: "How many times did it happen?" or "How many times in the past 24 hours?"     About 6:30 once today 4. VOMITING WITHOUT BLOOD: "How many times in the past 24 hours?"      Once today, pt states he had 3 episodes of emesis over the weekend 5. ONSET: "When did vomiting of blood begin?"     About 6:30 6. CAUSE: "What do you think is causing the vomiting of blood?"    unsure 7. BLOOD THINNERS: "Do you take any blood thinners?" (e.g., Coumadin/warfarin, Pradaxa/dabigatran, aspirin) No  8. DEHYDRATION: "Are there any signs of dehydration?" "When was the last time you urinated?" "Do you feel dizzy?" Dizzy when in the bathroom but not at this time. Pt is a dialysis pt and does not  urinate  9. ABDOMINAL PAIN: "Are you having any abdominal pain?" If yes: "What does it feel like? " (e.g., crampy, dull, intermittent, constant)      Has stomach pain, currently rating pain at 9 that is constant 10. DIARRHEA: "Is there any diarrhea?" If so, ask: "How many times today?"        No diarrhea 11. OTHER SYMPTOMS: "Do you have any other symptoms?" (e.g., fever, blood in stool)       Throat is sore at this time a few minutes ago  Protocols used: VOMITING BLOOD-A-AH

## 2017-09-20 ENCOUNTER — Other Ambulatory Visit: Payer: Self-pay | Admitting: Family Medicine

## 2017-09-20 ENCOUNTER — Other Ambulatory Visit: Payer: Self-pay

## 2017-09-20 ENCOUNTER — Ambulatory Visit (INDEPENDENT_AMBULATORY_CARE_PROVIDER_SITE_OTHER): Payer: Medicare Other | Admitting: Family Medicine

## 2017-09-20 ENCOUNTER — Encounter: Payer: Self-pay | Admitting: Family Medicine

## 2017-09-20 VITALS — BP 125/81 | HR 98 | Temp 98.6°F | Resp 18 | Ht 71.65 in | Wt 221.4 lb

## 2017-09-20 DIAGNOSIS — N186 End stage renal disease: Secondary | ICD-10-CM | POA: Diagnosis not present

## 2017-09-20 DIAGNOSIS — K920 Hematemesis: Secondary | ICD-10-CM | POA: Diagnosis not present

## 2017-09-20 DIAGNOSIS — R1084 Generalized abdominal pain: Secondary | ICD-10-CM

## 2017-09-20 DIAGNOSIS — R1310 Dysphagia, unspecified: Secondary | ICD-10-CM | POA: Diagnosis not present

## 2017-09-20 DIAGNOSIS — Z992 Dependence on renal dialysis: Secondary | ICD-10-CM | POA: Diagnosis not present

## 2017-09-20 LAB — COMPREHENSIVE METABOLIC PANEL
ALT: 16 IU/L (ref 0–44)
AST: 15 IU/L (ref 0–40)
Albumin/Globulin Ratio: 1.4 (ref 1.2–2.2)
Albumin: 4.4 g/dL (ref 3.5–5.5)
Alkaline Phosphatase: 119 IU/L — ABNORMAL HIGH (ref 39–117)
BUN/Creatinine Ratio: 3 — ABNORMAL LOW (ref 9–20)
BUN: 41 mg/dL — ABNORMAL HIGH (ref 6–24)
Bilirubin Total: 0.4 mg/dL (ref 0.0–1.2)
CO2: 24 mmol/L (ref 20–29)
Calcium: 8.6 mg/dL — ABNORMAL LOW (ref 8.7–10.2)
Chloride: 92 mmol/L — ABNORMAL LOW (ref 96–106)
Creatinine, Ser: 12.83 mg/dL (ref 0.76–1.27)
GFR calc Af Amer: 5 mL/min/{1.73_m2} — ABNORMAL LOW (ref >59)
GFR calc non Af Amer: 4 mL/min/{1.73_m2} — ABNORMAL LOW (ref >59)
Globulin, Total: 3.2 g/dL (ref 1.5–4.5)
Glucose: 182 mg/dL — ABNORMAL HIGH (ref 65–99)
Potassium: 5 mmol/L (ref 3.5–5.2)
Sodium: 138 mmol/L (ref 134–144)
Total Protein: 7.6 g/dL (ref 6.0–8.5)

## 2017-09-20 LAB — CBC WITH DIFFERENTIAL/PLATELET
Basophils Absolute: 0 10*3/uL (ref 0.0–0.2)
Basos: 1 %
EOS (ABSOLUTE): 0.1 10*3/uL (ref 0.0–0.4)
Eos: 2 %
Hematocrit: 42.3 % (ref 37.5–51.0)
Hemoglobin: 14.2 g/dL (ref 13.0–17.7)
Immature Grans (Abs): 0 10*3/uL (ref 0.0–0.1)
Immature Granulocytes: 0 %
Lymphocytes Absolute: 1.5 10*3/uL (ref 0.7–3.1)
Lymphs: 27 %
MCH: 31.9 pg (ref 26.6–33.0)
MCHC: 33.6 g/dL (ref 31.5–35.7)
MCV: 95 fL (ref 79–97)
Monocytes Absolute: 0.6 10*3/uL (ref 0.1–0.9)
Monocytes: 10 %
Neutrophils Absolute: 3.3 10*3/uL (ref 1.4–7.0)
Neutrophils: 60 %
Platelets: 162 10*3/uL (ref 150–450)
RBC: 4.45 x10E6/uL (ref 4.14–5.80)
RDW: 15.9 % — ABNORMAL HIGH (ref 12.3–15.4)
WBC: 5.5 10*3/uL (ref 3.4–10.8)

## 2017-09-20 MED ORDER — ONDANSETRON 8 MG PO TBDP: 8.0000 mg | ORAL_TABLET | Freq: Three times a day (TID) | ORAL | 0 refills | Status: DC | PRN

## 2017-09-20 MED ORDER — OMEPRAZOLE 40 MG PO CPDR: 40.0000 mg | DELAYED_RELEASE_CAPSULE | Freq: Every day | ORAL | 0 refills | Status: DC

## 2017-09-20 NOTE — Progress Notes (Signed)
7/2/20199:48 AM  Howard Bell [birth year], 52 y.o. male [id number]  No chief complaint on file.   HPI:   Patient is a 52 y.o. male with past medical history significant for ESRD on HD M/W/F, DM2 and HTN who presents today for continued abdominal pain and vomiting with blood.   He reports that for the past 3 weeks he has been having multiple episodes of nausea and vomiting. Sometimes there is dark red blood present, sometimes not.  He reports that after he eats he will get epigastric/ mid abdominal pain.  He denies anorexia or early satiety. However he does feel that food gets stuck in throat He denies heartburn He denies any diarrhea or constipation, last BM was yesterday and it was normal Denies any black tarry stools nor bright red blood in stool He reports passing gas He denies any fever or chills He does have a mild cough with mild sputum.  He denies any SOB, pain with breathing or CP He is anuric He denies any sick contacts  Seen in ED 09/09/17 for acute abd pain - normal WBC. CXR not acute, CT abd/pelvis - RLL consolidation, thickened GB wall, no stones, no other acute findings. He has several hernias.   Seen at PCP on 09/13/17 - AXR - Several air-filled loops of small bowel with slight small bowel wall thickening noted. A primary small bowel process including enteritis or ischemia could present in this fashion. Referred to GI - has not been able to make appointment yet.     Fall Risk  09/13/2017 07/26/2017 05/07/2017  Falls in the past year? Yes No No  Number falls in past yr: 2 or more - -  Injury with Fall? Yes - -  Comment legs - -     Depression screen [facility] 2/9 09/13/2017 07/26/2017 05/07/2017  Decreased Interest 1 0 0  Down, Depressed, Hopeless 2 0 2  PHQ - 2 Score 3 0 2  Altered sleeping 3 - 3  Tired, decreased energy 1 - 1  Change in appetite 2 - 0  Feeling bad or failure about yourself  3 - 2  Trouble concentrating 2 - 2  Moving slowly or fidgety/restless 3 -  1  Suicidal thoughts 2 - 2  PHQ-9 Score 19 - 13  Difficult doing work/chores - - Somewhat difficult    No Known Allergies  Prior to Admission medications   Medication Sig Start Date End Date Taking? Authorizing Provider  amLODipine (NORVASC) 10 MG tablet Take 1 tablet (10 mg total) by mouth daily. 03/30/17   Hongalgi, Lenis Dickinson, MD  brimonidine (ALPHAGAN) 0.2 % ophthalmic solution Place 1 drop into both eyes 2 (two) times daily.    [provider]  insulin lispro (HUMALOG KWIKPEN) 100 UNIT/ML KiwkPen Inject 0-0.09 mLs (0-9 Units total) into the skin 3 (three) times daily with meals. CBG < 70: Eat or drink something sweet and recheck. CBG 70 - 120: 0 units CBG 121 - 150: 1 unit CBG 151 - 200: 2 units CBG 201 - 250: 3 units CBG 251 - 300: 5 units CBG 301 - 350: 7 units CBG 351 - 400: 9 units CBG > 400: call MD. 03/30/17   Modena Jansky, MD  metoprolol succinate (TOPROL-XL) 100 MG 24 hr tablet Take 1 tablet (100 mg total) by mouth daily. Take with or immediately following a meal. 03/30/17   Hongalgi, Lenis Dickinson, MD  timolol (BETIMOL) 0.5 % ophthalmic solution Place 1 drop into both eyes 2 (two)  times daily. 05/07/17   Ivar Drape D, PA  travoprost, benzalkonium, (TRAVATAN) 0.004 % ophthalmic solution Place 1 drop into both eyes at bedtime.    [provider]    Past Medical History:  Diagnosis Date  . Congestive heart failure (CHF) ([place])   . 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 ESRD (end stage renal disease) on dialysis ([place])   . Hypertension     Past Surgical History:  Procedure Laterality Date  . HERNIA REPAIR    . IR AV DIALY SHUNT INTRO NEEDLE/INTRACATH INITIAL W/PTA/IMG LEFT  03/30/2017    Social History   Tobacco Use  . Smoking status: Never Smoker  . Smokeless tobacco: Never Used  Substance Use Topics  . Alcohol use: No    Family History  Problem Relation Age of Onset  . Diabetes Mother   . Hypertension Father      ROS Per hpi  OBJECTIVE:  Blood pressure 132/85, pulse (!) 108, temperature 98.6 F (37 C), temperature source Oral, resp. rate 18, height 5' 11.65" (1.82 m), weight 221 lb 6.4 oz (100.4 kg), SpO2 90 %., increased to 96% with deep breathing  Wt Readings from Last 3 Encounters:  09/20/17 221 lb 6.4 oz (100.4 kg)  09/13/17 223 lb 6.4 oz (101.3 kg)  09/09/17 220 lb (99.8 kg)     Physical Exam  Constitutional: He is oriented to person, place, and time. He appears well-developed and well-nourished. He does not appear ill.  HENT:  Head: Normocephalic and atraumatic.  Right Ear: Hearing, tympanic membrane, external ear and ear canal normal.  Left Ear: Hearing, tympanic membrane, external ear and ear canal normal.  Mouth/Throat: Oropharynx is clear and moist. No oropharyngeal exudate.  Eyes: Pupils are equal, round, and reactive to light. Conjunctivae and EOM are normal.  Neck: Neck supple.  Cardiovascular: Normal rate and regular rhythm. Exam reveals no gallop and no friction rub.  No murmur heard. Pulmonary/Chest: Effort normal and breath sounds normal. He has no wheezes. He has no rhonchi. He has no rales.  Abdominal: Soft. Bowel sounds are normal. There is generalized tenderness. There is no rebound, no guarding and negative Murphy's sign. A hernia is present. Hernia confirmed positive in the ventral area.  Lymphadenopathy:    He has no cervical adenopathy.  Neurological: He is alert and oriented to person, place, and time.  Skin: Skin is warm and dry.  Psychiatric: He has a normal mood and affect.  Nursing note and vitals reviewed.   My interpretation of EKG:  Sinus, HR 102, LVH, LAFB, unchanged compared to feb 2019   ASSESSMENT and PLAN  1. Generalized abdominal pain Patient not ill appearing, vital signs stable at baseline. Exam seems unchanged from prior per notes. Does he have small gastric ulcer? Gastroparesis? CBC and CMP pending, further workup is concerning.  Otherwise trial of PPI and zofran. Facilitating appointment with GI. Strict precautions given.  - EKG 12-Lead  2. Hematemesis with nausea - CBC with Differential/Platelet  3. Dysphagia, unspecified type  4. ESRD on dialysis [facility]) - Comprehensive metabolic panel  Other orders - omeprazole (PRILOSEC) 40 MG capsule; Take 1 capsule (40 mg total) by mouth daily. - ondansetron (ZOFRAN ODT) 8 MG disintegrating tablet; Take 1 tablet (8 mg total) by mouth every 8 (eight) hours as needed for nausea or vomiting.  Return for after GI.    Rutherford Guys, MD [facility], [place] [address] Ph.  [phone] Fax [phone]

## 2017-09-20 NOTE — Patient Instructions (Addendum)
You should receive a phone call from our referral clerk with information regarding your appointment with gastroenterology. Further workup prior to that might be needed pending lab results.  I am sending prescription for omeprazole, as this might be a small ulcer and ondansetron to help with nausea and vomiting.  Please seek medical care if you start having fever, chills, significantly worsening abdominal pain, chest pain, shortness of breath, diarrhea, black tarry stools, unable to stop vomiting or keep hydrated.     IF you received an x-ray today, you will receive an invoice from [facility]. Please contact [facility] at [phone] with questions or concerns regarding your invoice.   IF you received labwork today, you will receive an invoice from [place]. Please contact [facility] at [phone] with questions or concerns regarding your invoice.   Our billing staff will not be able to assist you with questions regarding bills from these companies.  You will be contacted with the lab results as soon as they are available. The fastest way to get your results is to activate your My Chart account. Instructions are located on the last page of this paperwork. If you have not heard from [place] regarding the results in 2 weeks, please contact this office.

## 2017-09-21 DIAGNOSIS — I159 Secondary hypertension, unspecified: Secondary | ICD-10-CM | POA: Diagnosis not present

## 2017-09-21 DIAGNOSIS — N2581 Secondary hyperparathyroidism of renal origin: Secondary | ICD-10-CM | POA: Diagnosis not present

## 2017-09-21 DIAGNOSIS — D509 Iron deficiency anemia, unspecified: Secondary | ICD-10-CM | POA: Diagnosis not present

## 2017-09-21 DIAGNOSIS — E1129 Type 2 diabetes mellitus with other diabetic kidney complication: Secondary | ICD-10-CM | POA: Diagnosis not present

## 2017-09-21 DIAGNOSIS — N186 End stage renal disease: Secondary | ICD-10-CM | POA: Diagnosis not present

## 2017-09-23 ENCOUNTER — Encounter: Payer: Self-pay | Admitting: Gastroenterology

## 2017-09-23 ENCOUNTER — Ambulatory Visit (INDEPENDENT_AMBULATORY_CARE_PROVIDER_SITE_OTHER): Payer: Medicare Other | Admitting: Gastroenterology

## 2017-09-23 VITALS — BP 126/88 | HR 74 | Ht 72.0 in | Wt 228.1 lb

## 2017-09-23 DIAGNOSIS — R1084 Generalized abdominal pain: Secondary | ICD-10-CM

## 2017-09-23 DIAGNOSIS — N186 End stage renal disease: Secondary | ICD-10-CM

## 2017-09-23 DIAGNOSIS — E118 Type 2 diabetes mellitus with unspecified complications: Secondary | ICD-10-CM

## 2017-09-23 DIAGNOSIS — Z992 Dependence on renal dialysis: Secondary | ICD-10-CM | POA: Diagnosis not present

## 2017-09-23 MED ORDER — METOCLOPRAMIDE HCL 5 MG PO TABS: 5.0000 mg | ORAL_TABLET | Freq: Three times a day (TID) | ORAL | 0 refills | Status: DC

## 2017-09-23 MED ORDER — TRAMADOL HCL 50 MG PO TABS: 50.0000 mg | ORAL_TABLET | Freq: Two times a day (BID) | ORAL | 0 refills | Status: DC | PRN

## 2017-09-23 NOTE — Progress Notes (Signed)
[facility] Consult Note:  History: Howard Bell 09/23/2017  Referring physician: Ivar Drape D, PA  Reason for consult/chief complaint: Abdominal Pain (LUQ, epigastric); Dysphagia; Coughing up blood (dark red); throat pain (has after vomiting); and Emesis   Subjective  HPI:  This is a 52 year old man accompanied by his wife, referred by primary care for recent onset upper abdominal pain and vomiting.  He is a poor historian, much of the history was obtained from extensive chart review and speaking with his wife.  He is an insulin requiring diabetic with poor control, end-stage renal failure on dialysis for at least the last 8 years who developed upper abdominal pain about a month ago.  It seems to occur overnight in the early hours of the morning, more so on the left side.  He says he cannot get comfortable and there might be associated vomiting.  On a couple of occasions the vomiting has been red, concerning for blood.  He went to the ED on June 22 for these symptoms with results as noted below.  He cannot recall any recent changes in medicines or other clear triggers for the onset of symptoms.  He does not recall having anything like this in the past and neither he nor his wife recall him having a previous upper endoscopy. Associated symptoms are heartburn dysphagia loss of appetite and intermittent diarrhea.  6/21 ROS:  Review of Systems  Constitutional: Positive for fatigue. Negative for appetite change and unexpected weight change.  HENT: Negative for mouth sores and voice change.   Eyes: Negative for pain and redness.  Respiratory: Positive for cough and shortness of breath.        He has lately had a chronic cough with mucus production and wonders if this is related to his upper digestive tract.  Cardiovascular: Negative for chest pain and palpitations.  Genitourinary: Negative for dysuria and hematuria.  Musculoskeletal: Negative for arthralgias  and myalgias.  Skin: Negative for pallor and rash.  Allergic/Immunologic: Positive for environmental allergies.  Neurological: Positive for headaches. Negative for weakness.  Hematological: Negative for adenopathy.  Psychiatric/Behavioral: Positive for dysphoric mood.     Past Medical History: Past Medical History:  Diagnosis Date  . Congestive heart failure (CHF) ([place])   . 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 ESRD (end stage renal disease) on dialysis ([place])   . Hypertension    Seen by Dr. Einar Gip (Cardiology) during 2018 admission for pulmonary edema due to missed HD treatment.  I reviewed the H&P and discharge summary from that hospitalization.  I do not have access to that cardiologist's outpatient notes nor echocardiogram findings.  Epic note indicates does not meet some criteria for renal transplant listing  Last endocrinology note from 12/18 noting noncompliance with CBG testing and late for appointment  Past Surgical History: Past Surgical History:  Procedure Laterality Date  . HERNIA REPAIR    . IR AV DIALY SHUNT INTRO NEEDLE/INTRACATH INITIAL W/PTA/IMG LEFT  03/30/2017     Family History: Family History  Problem Relation Age of Onset  . Diabetes Mother   . Hypertension Father     Social History: Social History   Socioeconomic History  . Marital status: Married    Spouse name: Not on file  . Number of children: Not on file  . Years of education: Not on file  . Highest education level: Not on file  Occupational History  . Not on file  Social Needs  . Financial  resource strain: Not on file  . Food insecurity:    Worry: Not on file    Inability: Not on file  . Transportation needs:    Medical: Not on file    Non-medical: Not on file  Tobacco Use  . Smoking status: Never Smoker  . Smokeless tobacco: Never Used  Substance and Sexual Activity  . Alcohol use: No  . Drug use: No  . Sexual activity: Not on file  Lifestyle  .  Physical activity:    Days per week: Not on file    Minutes per session: Not on file  . Stress: Not on file  Relationships  . Social connections:    Talks on phone: Not on file    Gets together: Not on file    Attends religious service: Not on file    Active member of club or organization: Not on file    Attends meetings of clubs or organizations: Not on file    Relationship status: Not on file  Other Topics Concern  . Not on file  Social History Narrative  . Not on file    Allergies: No Known Allergies  Outpatient Meds: Current Outpatient Medications  Medication Sig Dispense Refill  . amLODipine (NORVASC) 10 MG tablet Take 1 tablet (10 mg total) by mouth daily. 30 tablet 0  . brimonidine (ALPHAGAN) 0.2 % ophthalmic solution Place 1 drop into both eyes 2 (two) times daily.    . insulin lispro (HUMALOG KWIKPEN) 100 UNIT/ML KiwkPen Inject 0-0.09 mLs (0-9 Units total) into the skin 3 (three) times daily with meals. CBG < 70: Eat or drink something sweet and recheck. CBG 70 - 120: 0 units CBG 121 - 150: 1 unit CBG 151 - 200: 2 units CBG 201 - 250: 3 units CBG 251 - 300: 5 units CBG 301 - 350: 7 units CBG 351 - 400: 9 units CBG > 400: call MD.    . metoprolol succinate (TOPROL-XL) 100 MG 24 hr tablet Take 1 tablet (100 mg total) by mouth daily. Take with or immediately following a meal. 30 tablet 0  . omeprazole (PRILOSEC) 40 MG capsule Take 1 capsule (40 mg total) by mouth daily. 30 capsule 0  . ondansetron (ZOFRAN ODT) 8 MG disintegrating tablet Take 1 tablet (8 mg total) by mouth every 8 (eight) hours as needed for nausea or vomiting. 10 tablet 0  . timolol (BETIMOL) 0.5 % ophthalmic solution Place 1 drop into both eyes 2 (two) times daily. 10 mL 1  . travoprost, benzalkonium, (TRAVATAN) 0.004 % ophthalmic solution Place 1 drop into both eyes at bedtime.    . metoCLOPramide (REGLAN) 5 MG tablet Take 1 tablet (5 mg total) by mouth 3 (three) times daily before meals. 90 tablet 0    . traMADol (ULTRAM) 50 MG tablet Take 1 tablet (50 mg total) by mouth every 12 (twelve) hours as needed. 20 tablet 0   No current facility-administered medications for this visit.       ___________________________________________________________________ Objective   Exam:  BP 126/88   Pulse 74   Ht 6' (1.829 m)   Wt 228 lb 2 oz (103.5 kg)   BMI 30.94 kg/m    General: this is a(n) chronically ill-appearing man with a blunted affect.  Antalgic and somewhat discoordinated gait.  Poor muscle mass of his legs.  Venous stasis changes and healed ulcers on lower legs.  Eyes: sclera anicteric, no redness  ENT: oral mucosa moist without lesions, no cervical or supraclavicular  lymphadenopathy, good dentition.  Thick neck  CV: RRR without murmur, S1/S2, no JVD, no peripheral edema  Resp: clear to auscultation bilaterally, normal RR and effort noted  GI: soft, LUQ point tenderness without inflammation or bruise., with active bowel sounds. No guarding or palpable organomegaly noted.  Skin; warm and dry, no rash or jaundice noted.  Venous stasis changes as noted  above  Neuro: awake, alert and oriented x 3. Normal gross motor function and fluent speech.  Gait assessment as noted above.  Labs:  CMP Latest Ref Rng & Units 09/20/2017 09/09/2017 05/07/2017  Glucose 65 - 99 mg/dL 182(H) 136(H) 202(H)  BUN 6 - 24 mg/dL 41(H) 26(H) 37(H)  Creatinine 0.76 - 1.27 mg/dL 12.83(HH) 8.71(H) 9.73(HH)  Sodium 134 - 144 mmol/L 138 142 141  Potassium 3.5 - 5.2 mmol/L 5.0 3.8 4.1  Chloride 96 - 106 mmol/L 92(L) 93(L) 93(L)  CO2 20 - 29 mmol/L 24 33(H) 24  Calcium 8.7 - 10.2 mg/dL 8.6(L) 9.3 8.6(L)  Total Protein 6.0 - 8.5 g/dL 7.6 8.6(H) -  Total Bilirubin 0.0 - 1.2 mg/dL 0.4 1.1 -  Alkaline Phos 39 - 117 IU/L 119(H) 108 -  AST 0 - 40 IU/L 15 20 -  ALT 0 - 44 IU/L 16 16(L) -   CBC Latest Ref Rng & Units 09/20/2017 09/09/2017 03/30/2017  WBC 3.4 - 10.8 x10E3/uL 5.5 7.3 6.6  Hemoglobin 13.0 - 17.7 g/dL  14.2 14.3 13.0  Hematocrit 37.5 - 51.0 % 42.3 45.1 39.6  Platelets 150 - 450 x10E3/uL 162 164 147(L)    Last Hgb A1c 8.5 on 01/20/17  Radiologic Studies:  CTAP 09/10/17: multiple chronic findings.  GB wall thickened without surrounding inflammation or stones. Bilateral fat-containing inguinal and fat-containing umbilical hernias.  Assessment: Encounter Diagnoses  Name Primary?  . Generalized abdominal pain Yes  . ESRD on dialysis ([place])   . Diabetes mellitus with complication (HCC)     His recent onset upper abdominal pain is difficult to characterize.  It is not clear why it is mostly on the left, coinciding with the area of tenderness.  It seems most like an abdominal wall pain to me given my exam, as well as a lack of findings on recent CT scan.  He could have a gastritis or ulcer, less likely malignancy.  He has been vomiting and reportedly there has been some bleeding at times with that. He will need an upper endoscopy, though the scheduling of that is currently quite challenging due to the complexity of his medical issues, poorly controlled diabetes, dialysis schedule and need for his procedure to be done at the hospital where slots are limited. Patient at increased risk for cardiopulmonary complications of procedure due to medical comorbidities.   I am concerned he may have developed gastroparesis due to his poorly controlled diabetes.  Plan:  Metoclopramide 5 mg 3 times daily with meals.  I cautioned them about the risk of tardive dyskinesia and explained what this might look like.  His dose should not be increased due to his renal failure. He will follow-up with me in several weeks.  If he develops persistent hematemesis he should present to the emergency department immediately. Continue PPI as he is currently doing.  His wife states frustration that she cannot always make it to his office visits, so she is a difficult time keeping track of his medical conditions.  He really  should be checking his glucose at least twice a day, and she plans to get a  new glucometer and make an appointment to follow-up with Dr. Loanne Drilling.  Total time 60 minutes, over half spent face to face with patient.  Extensive chart review required.  Thank you for the courtesy of this consult.  Please call me with any questions or concerns.  Nelida Meuse III  CC: Joretta Bachelor, [place]

## 2017-09-23 NOTE — Patient Instructions (Signed)
If you are age 50 or older, your body mass index should be between 23-30. Your Body mass index is 30.94 kg/m. If this is out of the aforementioned range listed, please consider follow up with your Primary Care Provider.  If you are age 46 or younger, your body mass index should be between 19-25. Your Body mass index is 30.94 kg/m. If this is out of the aformentioned range listed, please consider follow up with your Primary Care Provider.   It was a pleasure to meet you today!  Dr. Loletha Carrow

## 2017-09-26 DIAGNOSIS — N2581 Secondary hyperparathyroidism of renal origin: Secondary | ICD-10-CM | POA: Diagnosis not present

## 2017-09-26 DIAGNOSIS — D509 Iron deficiency anemia, unspecified: Secondary | ICD-10-CM | POA: Diagnosis not present

## 2017-09-26 DIAGNOSIS — N186 End stage renal disease: Secondary | ICD-10-CM | POA: Diagnosis not present

## 2017-09-26 DIAGNOSIS — I159 Secondary hypertension, unspecified: Secondary | ICD-10-CM | POA: Diagnosis not present

## 2017-09-26 DIAGNOSIS — E1129 Type 2 diabetes mellitus with other diabetic kidney complication: Secondary | ICD-10-CM | POA: Diagnosis not present

## 2017-09-27 ENCOUNTER — Telehealth: Payer: Self-pay | Admitting: Family Medicine

## 2017-09-27 NOTE — Telephone Encounter (Signed)
Copied from [address] [id number]. Topic: General - Other >> Sep 27, 2017  5:51 PM Marin Olp L wrote: Reason for CRM: Patient needs glucose meter and does not want to come in for an appointment although PA stephanie english is no longer present. Please advise.

## 2017-09-28 DIAGNOSIS — D509 Iron deficiency anemia, unspecified: Secondary | ICD-10-CM | POA: Diagnosis not present

## 2017-09-28 DIAGNOSIS — I159 Secondary hypertension, unspecified: Secondary | ICD-10-CM | POA: Diagnosis not present

## 2017-09-28 DIAGNOSIS — E1129 Type 2 diabetes mellitus with other diabetic kidney complication: Secondary | ICD-10-CM | POA: Diagnosis not present

## 2017-09-28 DIAGNOSIS — N2581 Secondary hyperparathyroidism of renal origin: Secondary | ICD-10-CM | POA: Diagnosis not present

## 2017-09-28 DIAGNOSIS — N186 End stage renal disease: Secondary | ICD-10-CM | POA: Diagnosis not present

## 2017-09-30 DIAGNOSIS — I159 Secondary hypertension, unspecified: Secondary | ICD-10-CM | POA: Diagnosis not present

## 2017-09-30 DIAGNOSIS — N186 End stage renal disease: Secondary | ICD-10-CM | POA: Diagnosis not present

## 2017-09-30 DIAGNOSIS — D509 Iron deficiency anemia, unspecified: Secondary | ICD-10-CM | POA: Diagnosis not present

## 2017-09-30 DIAGNOSIS — N2581 Secondary hyperparathyroidism of renal origin: Secondary | ICD-10-CM | POA: Diagnosis not present

## 2017-09-30 DIAGNOSIS — E1129 Type 2 diabetes mellitus with other diabetic kidney complication: Secondary | ICD-10-CM | POA: Diagnosis not present

## 2017-09-30 NOTE — Telephone Encounter (Signed)
Please advise 

## 2017-10-02 MED ORDER — BLOOD GLUCOSE MONITOR KIT: PACK | 11 refills | Status: DC

## 2017-10-02 NOTE — Telephone Encounter (Signed)
Done. Patient seen by me of recent.

## 2017-10-03 DIAGNOSIS — N186 End stage renal disease: Secondary | ICD-10-CM | POA: Diagnosis not present

## 2017-10-03 DIAGNOSIS — D509 Iron deficiency anemia, unspecified: Secondary | ICD-10-CM | POA: Diagnosis not present

## 2017-10-03 DIAGNOSIS — E1129 Type 2 diabetes mellitus with other diabetic kidney complication: Secondary | ICD-10-CM | POA: Diagnosis not present

## 2017-10-03 DIAGNOSIS — N2581 Secondary hyperparathyroidism of renal origin: Secondary | ICD-10-CM | POA: Diagnosis not present

## 2017-10-03 DIAGNOSIS — I159 Secondary hypertension, unspecified: Secondary | ICD-10-CM | POA: Diagnosis not present

## 2017-10-04 ENCOUNTER — Ambulatory Visit: Payer: Medicare Other | Admitting: Family Medicine

## 2017-10-05 DIAGNOSIS — I159 Secondary hypertension, unspecified: Secondary | ICD-10-CM | POA: Diagnosis not present

## 2017-10-05 DIAGNOSIS — N2581 Secondary hyperparathyroidism of renal origin: Secondary | ICD-10-CM | POA: Diagnosis not present

## 2017-10-05 DIAGNOSIS — D509 Iron deficiency anemia, unspecified: Secondary | ICD-10-CM | POA: Diagnosis not present

## 2017-10-05 DIAGNOSIS — N186 End stage renal disease: Secondary | ICD-10-CM | POA: Diagnosis not present

## 2017-10-05 DIAGNOSIS — E1129 Type 2 diabetes mellitus with other diabetic kidney complication: Secondary | ICD-10-CM | POA: Diagnosis not present

## 2017-10-07 DIAGNOSIS — N186 End stage renal disease: Secondary | ICD-10-CM | POA: Diagnosis not present

## 2017-10-07 DIAGNOSIS — E1129 Type 2 diabetes mellitus with other diabetic kidney complication: Secondary | ICD-10-CM | POA: Diagnosis not present

## 2017-10-07 DIAGNOSIS — N2581 Secondary hyperparathyroidism of renal origin: Secondary | ICD-10-CM | POA: Diagnosis not present

## 2017-10-07 DIAGNOSIS — D509 Iron deficiency anemia, unspecified: Secondary | ICD-10-CM | POA: Diagnosis not present

## 2017-10-07 DIAGNOSIS — I159 Secondary hypertension, unspecified: Secondary | ICD-10-CM | POA: Diagnosis not present

## 2017-10-10 ENCOUNTER — Telehealth: Payer: Self-pay | Admitting: Gastroenterology

## 2017-10-10 DIAGNOSIS — N186 End stage renal disease: Secondary | ICD-10-CM | POA: Diagnosis not present

## 2017-10-10 DIAGNOSIS — N2581 Secondary hyperparathyroidism of renal origin: Secondary | ICD-10-CM | POA: Diagnosis not present

## 2017-10-10 DIAGNOSIS — E1129 Type 2 diabetes mellitus with other diabetic kidney complication: Secondary | ICD-10-CM | POA: Diagnosis not present

## 2017-10-10 DIAGNOSIS — D509 Iron deficiency anemia, unspecified: Secondary | ICD-10-CM | POA: Diagnosis not present

## 2017-10-10 DIAGNOSIS — I159 Secondary hypertension, unspecified: Secondary | ICD-10-CM | POA: Diagnosis not present

## 2017-10-10 NOTE — Telephone Encounter (Signed)
Since the 8/6 slot has been filled, please put him in for EGD on 9/16 at [facility] and keep office appointment. - HD

## 2017-10-10 NOTE — Telephone Encounter (Signed)
Please contact Howard Bell (or his wife, if he asks you to) to plan an upper endoscopy. He recently saw me for upper abdominal pain and vomiting.  I started reglan, but also wanted to do an EGD.  Scheduling was complicated since it needs to be done at the hospital and he has dialysis M/W/F.  I have an open EGD slot Tues, Aug 6th at 9:30 (8am arrival).  These slots are very limited availability, so I hope he will take the opportunity to do it on that date.    He would continue to dose his insulin day before on a sliding scale, as I believe he does now.  Must check glucose AM of procedure since he will be NPO after midnight.  Have glucose tablets available at home in case glucose runs low that morning.  If he does schedule this as I hope, then please cancel the 8/13 office appointment.

## 2017-10-11 NOTE — Telephone Encounter (Signed)
Spoke to patient let him know when the procedure is scheduled and will need to keep his follow up appointment on 11/01/17. I will put in the mail the prep instructions including sliding scale information for insulin.

## 2017-10-11 NOTE — Telephone Encounter (Signed)
Left message for patient to call back  

## 2017-10-14 DIAGNOSIS — E1129 Type 2 diabetes mellitus with other diabetic kidney complication: Secondary | ICD-10-CM | POA: Diagnosis not present

## 2017-10-14 DIAGNOSIS — D509 Iron deficiency anemia, unspecified: Secondary | ICD-10-CM | POA: Diagnosis not present

## 2017-10-14 DIAGNOSIS — N2581 Secondary hyperparathyroidism of renal origin: Secondary | ICD-10-CM | POA: Diagnosis not present

## 2017-10-14 DIAGNOSIS — I159 Secondary hypertension, unspecified: Secondary | ICD-10-CM | POA: Diagnosis not present

## 2017-10-14 DIAGNOSIS — N186 End stage renal disease: Secondary | ICD-10-CM | POA: Diagnosis not present

## 2017-10-17 DIAGNOSIS — N186 End stage renal disease: Secondary | ICD-10-CM | POA: Diagnosis not present

## 2017-10-17 DIAGNOSIS — N2581 Secondary hyperparathyroidism of renal origin: Secondary | ICD-10-CM | POA: Diagnosis not present

## 2017-10-17 DIAGNOSIS — E1129 Type 2 diabetes mellitus with other diabetic kidney complication: Secondary | ICD-10-CM | POA: Diagnosis not present

## 2017-10-17 DIAGNOSIS — D509 Iron deficiency anemia, unspecified: Secondary | ICD-10-CM | POA: Diagnosis not present

## 2017-10-17 DIAGNOSIS — I159 Secondary hypertension, unspecified: Secondary | ICD-10-CM | POA: Diagnosis not present

## 2017-10-18 ENCOUNTER — Other Ambulatory Visit: Payer: Self-pay | Admitting: Family Medicine

## 2017-10-19 DIAGNOSIS — N2581 Secondary hyperparathyroidism of renal origin: Secondary | ICD-10-CM | POA: Diagnosis not present

## 2017-10-19 DIAGNOSIS — N186 End stage renal disease: Secondary | ICD-10-CM | POA: Diagnosis not present

## 2017-10-19 DIAGNOSIS — E1129 Type 2 diabetes mellitus with other diabetic kidney complication: Secondary | ICD-10-CM | POA: Diagnosis not present

## 2017-10-19 DIAGNOSIS — I159 Secondary hypertension, unspecified: Secondary | ICD-10-CM | POA: Diagnosis not present

## 2017-10-19 DIAGNOSIS — D509 Iron deficiency anemia, unspecified: Secondary | ICD-10-CM | POA: Diagnosis not present

## 2017-10-20 DIAGNOSIS — Z992 Dependence on renal dialysis: Secondary | ICD-10-CM | POA: Diagnosis not present

## 2017-10-20 DIAGNOSIS — N186 End stage renal disease: Secondary | ICD-10-CM | POA: Diagnosis not present

## 2017-10-20 DIAGNOSIS — T82858A Stenosis of vascular prosthetic devices, implants and grafts, initial encounter: Secondary | ICD-10-CM | POA: Diagnosis not present

## 2017-10-20 DIAGNOSIS — I871 Compression of vein: Secondary | ICD-10-CM | POA: Diagnosis not present

## 2017-10-20 DIAGNOSIS — E1129 Type 2 diabetes mellitus with other diabetic kidney complication: Secondary | ICD-10-CM | POA: Diagnosis not present

## 2017-10-21 DIAGNOSIS — N2581 Secondary hyperparathyroidism of renal origin: Secondary | ICD-10-CM | POA: Diagnosis not present

## 2017-10-21 DIAGNOSIS — D509 Iron deficiency anemia, unspecified: Secondary | ICD-10-CM | POA: Diagnosis not present

## 2017-10-21 DIAGNOSIS — D631 Anemia in chronic kidney disease: Secondary | ICD-10-CM | POA: Diagnosis not present

## 2017-10-21 DIAGNOSIS — N186 End stage renal disease: Secondary | ICD-10-CM | POA: Diagnosis not present

## 2017-10-21 DIAGNOSIS — E1129 Type 2 diabetes mellitus with other diabetic kidney complication: Secondary | ICD-10-CM | POA: Diagnosis not present

## 2017-10-24 DIAGNOSIS — N2581 Secondary hyperparathyroidism of renal origin: Secondary | ICD-10-CM | POA: Diagnosis not present

## 2017-10-24 DIAGNOSIS — N186 End stage renal disease: Secondary | ICD-10-CM | POA: Diagnosis not present

## 2017-10-24 DIAGNOSIS — D509 Iron deficiency anemia, unspecified: Secondary | ICD-10-CM | POA: Diagnosis not present

## 2017-10-24 DIAGNOSIS — E1129 Type 2 diabetes mellitus with other diabetic kidney complication: Secondary | ICD-10-CM | POA: Diagnosis not present

## 2017-10-24 DIAGNOSIS — D631 Anemia in chronic kidney disease: Secondary | ICD-10-CM | POA: Diagnosis not present

## 2017-10-26 DIAGNOSIS — D631 Anemia in chronic kidney disease: Secondary | ICD-10-CM | POA: Diagnosis not present

## 2017-10-26 DIAGNOSIS — N186 End stage renal disease: Secondary | ICD-10-CM | POA: Diagnosis not present

## 2017-10-26 DIAGNOSIS — E1129 Type 2 diabetes mellitus with other diabetic kidney complication: Secondary | ICD-10-CM | POA: Diagnosis not present

## 2017-10-26 DIAGNOSIS — D509 Iron deficiency anemia, unspecified: Secondary | ICD-10-CM | POA: Diagnosis not present

## 2017-10-26 DIAGNOSIS — N2581 Secondary hyperparathyroidism of renal origin: Secondary | ICD-10-CM | POA: Diagnosis not present

## 2017-10-28 DIAGNOSIS — D631 Anemia in chronic kidney disease: Secondary | ICD-10-CM | POA: Diagnosis not present

## 2017-10-28 DIAGNOSIS — N2581 Secondary hyperparathyroidism of renal origin: Secondary | ICD-10-CM | POA: Diagnosis not present

## 2017-10-28 DIAGNOSIS — N186 End stage renal disease: Secondary | ICD-10-CM | POA: Diagnosis not present

## 2017-10-28 DIAGNOSIS — E1129 Type 2 diabetes mellitus with other diabetic kidney complication: Secondary | ICD-10-CM | POA: Diagnosis not present

## 2017-10-28 DIAGNOSIS — D509 Iron deficiency anemia, unspecified: Secondary | ICD-10-CM | POA: Diagnosis not present

## 2017-10-31 DIAGNOSIS — D631 Anemia in chronic kidney disease: Secondary | ICD-10-CM | POA: Diagnosis not present

## 2017-10-31 DIAGNOSIS — E1129 Type 2 diabetes mellitus with other diabetic kidney complication: Secondary | ICD-10-CM | POA: Diagnosis not present

## 2017-10-31 DIAGNOSIS — D509 Iron deficiency anemia, unspecified: Secondary | ICD-10-CM | POA: Diagnosis not present

## 2017-10-31 DIAGNOSIS — N186 End stage renal disease: Secondary | ICD-10-CM | POA: Diagnosis not present

## 2017-10-31 DIAGNOSIS — N2581 Secondary hyperparathyroidism of renal origin: Secondary | ICD-10-CM | POA: Diagnosis not present

## 2017-11-01 ENCOUNTER — Ambulatory Visit (INDEPENDENT_AMBULATORY_CARE_PROVIDER_SITE_OTHER): Payer: Medicare Other | Admitting: Gastroenterology

## 2017-11-01 ENCOUNTER — Encounter: Payer: Self-pay | Admitting: Gastroenterology

## 2017-11-01 VITALS — BP 172/90 | HR 70 | Ht 72.0 in | Wt 216.0 lb

## 2017-11-01 DIAGNOSIS — E118 Type 2 diabetes mellitus with unspecified complications: Secondary | ICD-10-CM

## 2017-11-01 DIAGNOSIS — N186 End stage renal disease: Secondary | ICD-10-CM

## 2017-11-01 DIAGNOSIS — Z992 Dependence on renal dialysis: Secondary | ICD-10-CM | POA: Diagnosis not present

## 2017-11-01 DIAGNOSIS — R1012 Left upper quadrant pain: Secondary | ICD-10-CM

## 2017-11-01 DIAGNOSIS — R053 Chronic cough: Secondary | ICD-10-CM

## 2017-11-01 DIAGNOSIS — R05 Cough: Secondary | ICD-10-CM | POA: Diagnosis not present

## 2017-11-01 DIAGNOSIS — R112 Nausea with vomiting, unspecified: Secondary | ICD-10-CM

## 2017-11-01 MED ORDER — ACETAMINOPHEN-CODEINE #3 300-30 MG PO TABS: 1.0000 | ORAL_TABLET | Freq: Every day | ORAL | 0 refills | Status: DC

## 2017-11-01 NOTE — Progress Notes (Signed)
[facility] Progress Note  Chief Complaint: Left upper quadrant pain, nausea and vomiting  Subjective  History:  Howard Bell follows up with his wife today.  He still has upper abdominal pain and intermittent vomiting.  Symptoms have not improved at all on Reglan 3 times daily before meals.  He also still has a chronic dry cough multiple times a day and overnight.  He and his wife expressed frustration that this has not had an apparent explanation.  It is difficult to say whether it is the cough or pain that is keeping him up at night. His appetite has decreased.  At his June visit, he was 223 pounds, up to 228 when recorded on July 5, 216 pounds today.  His weight fluctuates between dialysis as well. . ROS: Cardiovascular:  no chest pain Respiratory: Chronic dry cough, sometimes clear sputum production, no hemoptysis He bruises easily, has chronic arthritic pain and insomnia Remainder of systems negative except as above  The patient's Past Medical, Family and Social History were reviewed and are on file in the EMR.  Objective:  Med list reviewed  Current Outpatient Medications:  .  amLODipine (NORVASC) 10 MG tablet, Take 1 tablet (10 mg total) by mouth daily., Disp: 30 tablet, Rfl: 0 .  blood glucose meter kit and supplies KIT, Per insurance preference. Check glucose TID. E11.65, Z79.4, Disp: 1 each, Rfl: 11 .  brimonidine (ALPHAGAN) 0.2 % ophthalmic solution, Place 1 drop into both eyes 2 (two) times daily., Disp: , Rfl:  .  insulin lispro (HUMALOG KWIKPEN) 100 UNIT/ML KiwkPen, Inject 0-0.09 mLs (0-9 Units total) into the skin 3 (three) times daily with meals. CBG < 70: Eat or drink something sweet and recheck. CBG 70 - 120: 0 units CBG 121 - 150: 1 unit CBG 151 - 200: 2 units CBG 201 - 250: 3 units CBG 251 - 300: 5 units CBG 301 - 350: 7 units CBG 351 - 400: 9 units CBG > 400: call MD., Disp: , Rfl:  .  metoCLOPramide (REGLAN) 5 MG tablet, Take 1 tablet (5 mg total) by mouth 3  (three) times daily before meals., Disp: 90 tablet, Rfl: 0 .  metoprolol succinate (TOPROL-XL) 100 MG 24 hr tablet, Take 1 tablet (100 mg total) by mouth daily. Take with or immediately following a meal., Disp: 30 tablet, Rfl: 0 .  omeprazole (PRILOSEC) 40 MG capsule, TAKE 1 CAPSULE(40 MG) BY MOUTH DAILY, Disp: 30 capsule, Rfl: 0 .  ondansetron (ZOFRAN ODT) 8 MG disintegrating tablet, Take 1 tablet (8 mg total) by mouth every 8 (eight) hours as needed for nausea or vomiting., Disp: 10 tablet, Rfl: 0 .  timolol (BETIMOL) 0.5 % ophthalmic solution, Place 1 drop into both eyes 2 (two) times daily., Disp: 10 mL, Rfl: 1 .  traMADol (ULTRAM) 50 MG tablet, Take 1 tablet (50 mg total) by mouth every 12 (twelve) hours as needed., Disp: 20 tablet, Rfl: 0 .  travoprost, benzalkonium, (TRAVATAN) 0.004 % ophthalmic solution, Place 1 drop into both eyes at bedtime., Disp: , Rfl:  .  acetaminophen-codeine (TYLENOL #3) 300-30 MG tablet, Take 1 tablet by mouth at bedtime., Disp: 15 tablet, Rfl: 0   Vital signs in last 24 hrs: Vitals:   11/01/17 1343  BP: (!) 172/90  Pulse: 70    Physical Exam  Chronically ill-appearing man, blunted affect as before, wife is with him today  HEENT: sclera anicteric, oral mucosa moist without lesions  Neck: supple, no thyromegaly, JVD or  lymphadenopathy  Cardiac: RRR without murmurs, S1S2 heard, no peripheral edema.  Multiple lower leg lesions after a fall at various stages of healing  Pulm: clear to auscultation bilaterally, normal RR and effort noted  Abdomen: soft, LUQ tenderness of the abdominal wall as before, with active bowel sounds. No guarding or palpable hepatosplenomegaly.  Skin; warm and dry, no jaundice or rash    _0 @ Assessment: Encounter Diagnoses  Name Primary?  . LUQ pain Yes  . Nausea and vomiting in adult   . ESRD on dialysis ([place])   . Diabetes mellitus with complication ([place])   . Chronic cough    I still feel that much  if not all of the left upper quadrant pain is musculoskeletal from this chronic cough.  However, he also has intermittent vomiting but did not improve on metoclopramide as expected.  That certainly does not rule out gastroparesis.  But if he did not have improvement on metoclopramide, then I will stop it for now. His wife states that he has not taken any of the tramadol that I prescribed.  Plan:  20 tablets of Tylenol/codeine 300/30 mg prescribed to take 1 tablet at bedtime for some hopeful relief of this muscular pain and cough, and these to help him get some rest.  This is until he can get reevaluation by primary care for this chronic cough.  I will copy my note to primary care today.  He was instructed not to take this medicine along with tramadol.  Discontinue metoclopramide.  Continue daily PPI.  His wife says she has checked his meds to make sure he is on it.  Upper endoscopy.  This must be done at the hospital due to his medical complexity.  He is agreeable after thorough discussion of procedure and risks.  We have held the date for him in September on Tuesday, given his need for Monday Wednesday Friday dialysis.  The benefits and risks of the planned procedure were described in detail with the patient or (when appropriate) their health care proxy.  Risks were outlined as including, but not limited to, bleeding, infection, perforation, adverse medication reaction leading to cardiac or pulmonary decompensation, or pancreatitis (if ERCP).  The limitation of incomplete mucosal visualization was also discussed.  No guarantees or warranties were given. Patient at increased risk for cardiopulmonary complications of procedure due to medical comorbidities.    Total time 40 minutes, over half spent face-to-face with patient in counseling and coordination of care.   Nelida Meuse III

## 2017-11-01 NOTE — Patient Instructions (Signed)
If you are age 50 or older, your body mass index should be between 23-30. Your Body mass index is 29.29 kg/m. If this is out of the aforementioned range listed, please consider follow up with your Primary Care Provider.  If you are age 65 or younger, your body mass index should be between 19-25. Your Body mass index is 29.29 kg/m. If this is out of the aformentioned range listed, please consider follow up with your Primary Care Provider.   You have been scheduled for an endoscopy. Please follow written instructions given to you at your visit today. If you use inhalers (even only as needed), please bring them with you on the day of your procedure. Your physician has requested that you go to www.startemmi.com and enter the access code given to you at your visit today. This web site gives a general overview about your procedure. However, you should still follow specific instructions given to you by our office regarding your preparation for the procedure.  Please stop the metoclopramide.   We have sent the following medications to your pharmacy for you to pick up at your convenience: Tylenol with codeine. DO NOT USE WITH TRAMADOL!!!   It was a pleasure to see you today!  Dr. Loletha Carrow

## 2017-11-02 DIAGNOSIS — D631 Anemia in chronic kidney disease: Secondary | ICD-10-CM | POA: Diagnosis not present

## 2017-11-02 DIAGNOSIS — N186 End stage renal disease: Secondary | ICD-10-CM | POA: Diagnosis not present

## 2017-11-02 DIAGNOSIS — E1129 Type 2 diabetes mellitus with other diabetic kidney complication: Secondary | ICD-10-CM | POA: Diagnosis not present

## 2017-11-02 DIAGNOSIS — N2581 Secondary hyperparathyroidism of renal origin: Secondary | ICD-10-CM | POA: Diagnosis not present

## 2017-11-02 DIAGNOSIS — D509 Iron deficiency anemia, unspecified: Secondary | ICD-10-CM | POA: Diagnosis not present

## 2017-11-04 DIAGNOSIS — N2581 Secondary hyperparathyroidism of renal origin: Secondary | ICD-10-CM | POA: Diagnosis not present

## 2017-11-04 DIAGNOSIS — E1129 Type 2 diabetes mellitus with other diabetic kidney complication: Secondary | ICD-10-CM | POA: Diagnosis not present

## 2017-11-04 DIAGNOSIS — N186 End stage renal disease: Secondary | ICD-10-CM | POA: Diagnosis not present

## 2017-11-04 DIAGNOSIS — D509 Iron deficiency anemia, unspecified: Secondary | ICD-10-CM | POA: Diagnosis not present

## 2017-11-04 DIAGNOSIS — D631 Anemia in chronic kidney disease: Secondary | ICD-10-CM | POA: Diagnosis not present

## 2017-11-06 ENCOUNTER — Encounter (HOSPITAL_COMMUNITY): Payer: Self-pay | Admitting: Emergency Medicine

## 2017-11-06 ENCOUNTER — Emergency Department (HOSPITAL_COMMUNITY): Payer: Medicare Other

## 2017-11-06 ENCOUNTER — Other Ambulatory Visit: Payer: Self-pay

## 2017-11-06 ENCOUNTER — Inpatient Hospital Stay (HOSPITAL_COMMUNITY)
Admission: EM | Admit: 2017-11-06 | Discharge: 2017-11-11 | DRG: 193 | Disposition: A | Discharge status: Skilled Nursing Facility | Payer: Medicare Other | Attending: Internal Medicine | Admitting: Internal Medicine

## 2017-11-06 DIAGNOSIS — E1122 Type 2 diabetes mellitus with diabetic chronic kidney disease: Secondary | ICD-10-CM | POA: Diagnosis present

## 2017-11-06 DIAGNOSIS — Z8673 Personal history of transient ischemic attack (TIA), and cerebral infarction without residual deficits: Secondary | ICD-10-CM | POA: Diagnosis not present

## 2017-11-06 DIAGNOSIS — I132 Hypertensive heart and chronic kidney disease with heart failure and with stage 5 chronic kidney disease, or end stage renal disease: Secondary | ICD-10-CM | POA: Diagnosis present

## 2017-11-06 DIAGNOSIS — R4182 Altered mental status, unspecified: Secondary | ICD-10-CM | POA: Diagnosis present

## 2017-11-06 DIAGNOSIS — I509 Heart failure, unspecified: Secondary | ICD-10-CM | POA: Diagnosis not present

## 2017-11-06 DIAGNOSIS — E8889 Other specified metabolic disorders: Secondary | ICD-10-CM | POA: Diagnosis present

## 2017-11-06 DIAGNOSIS — R778 Other specified abnormalities of plasma proteins: Secondary | ICD-10-CM | POA: Diagnosis present

## 2017-11-06 DIAGNOSIS — J9601 Acute respiratory failure with hypoxia: Secondary | ICD-10-CM | POA: Diagnosis present

## 2017-11-06 DIAGNOSIS — J189 Pneumonia, unspecified organism: Secondary | ICD-10-CM | POA: Diagnosis not present

## 2017-11-06 DIAGNOSIS — D619 Aplastic anemia, unspecified: Secondary | ICD-10-CM | POA: Diagnosis present

## 2017-11-06 DIAGNOSIS — E1152 Type 2 diabetes mellitus with diabetic peripheral angiopathy with gangrene: Secondary | ICD-10-CM | POA: Diagnosis not present

## 2017-11-06 DIAGNOSIS — Z794 Long term (current) use of insulin: Secondary | ICD-10-CM

## 2017-11-06 DIAGNOSIS — Z992 Dependence on renal dialysis: Secondary | ICD-10-CM | POA: Diagnosis not present

## 2017-11-06 DIAGNOSIS — E11649 Type 2 diabetes mellitus with hypoglycemia without coma: Secondary | ICD-10-CM | POA: Diagnosis present

## 2017-11-06 DIAGNOSIS — K3184 Gastroparesis: Secondary | ICD-10-CM | POA: Diagnosis present

## 2017-11-06 DIAGNOSIS — R7989 Other specified abnormal findings of blood chemistry: Secondary | ICD-10-CM | POA: Diagnosis present

## 2017-11-06 DIAGNOSIS — M6281 Muscle weakness (generalized): Secondary | ICD-10-CM | POA: Diagnosis present

## 2017-11-06 DIAGNOSIS — L97509 Non-pressure chronic ulcer of other part of unspecified foot with unspecified severity: Secondary | ICD-10-CM

## 2017-11-06 DIAGNOSIS — Z833 Family history of diabetes mellitus: Secondary | ICD-10-CM

## 2017-11-06 DIAGNOSIS — N186 End stage renal disease: Secondary | ICD-10-CM | POA: Diagnosis not present

## 2017-11-06 DIAGNOSIS — I10 Essential (primary) hypertension: Secondary | ICD-10-CM

## 2017-11-06 DIAGNOSIS — Z79899 Other long term (current) drug therapy: Secondary | ICD-10-CM | POA: Diagnosis not present

## 2017-11-06 DIAGNOSIS — D631 Anemia in chronic kidney disease: Secondary | ICD-10-CM | POA: Diagnosis not present

## 2017-11-06 DIAGNOSIS — Z23 Encounter for immunization: Secondary | ICD-10-CM | POA: Diagnosis not present

## 2017-11-06 DIAGNOSIS — Z79891 Long term (current) use of opiate analgesic: Secondary | ICD-10-CM | POA: Diagnosis not present

## 2017-11-06 DIAGNOSIS — J811 Chronic pulmonary edema: Secondary | ICD-10-CM

## 2017-11-06 DIAGNOSIS — Z4781 Encounter for orthopedic aftercare following surgical amputation: Secondary | ICD-10-CM | POA: Diagnosis not present

## 2017-11-06 DIAGNOSIS — R488 Other symbolic dysfunctions: Secondary | ICD-10-CM | POA: Diagnosis present

## 2017-11-06 DIAGNOSIS — N2581 Secondary hyperparathyroidism of renal origin: Secondary | ICD-10-CM | POA: Diagnosis present

## 2017-11-06 DIAGNOSIS — Z8249 Family history of ischemic heart disease and other diseases of the circulatory system: Secondary | ICD-10-CM

## 2017-11-06 DIAGNOSIS — E1143 Type 2 diabetes mellitus with diabetic autonomic (poly)neuropathy: Secondary | ICD-10-CM | POA: Diagnosis present

## 2017-11-06 DIAGNOSIS — J690 Pneumonitis due to inhalation of food and vomit: Secondary | ICD-10-CM | POA: Diagnosis present

## 2017-11-06 DIAGNOSIS — E8770 Fluid overload, unspecified: Secondary | ICD-10-CM | POA: Diagnosis not present

## 2017-11-06 DIAGNOSIS — G9340 Encephalopathy, unspecified: Secondary | ICD-10-CM | POA: Diagnosis present

## 2017-11-06 DIAGNOSIS — J181 Lobar pneumonia, unspecified organism: Secondary | ICD-10-CM | POA: Diagnosis not present

## 2017-11-06 DIAGNOSIS — R42 Dizziness and giddiness: Secondary | ICD-10-CM | POA: Diagnosis not present

## 2017-11-06 DIAGNOSIS — Z9981 Dependence on supplemental oxygen: Secondary | ICD-10-CM | POA: Diagnosis not present

## 2017-11-06 DIAGNOSIS — I70262 Atherosclerosis of native arteries of extremities with gangrene, left leg: Secondary | ICD-10-CM | POA: Diagnosis not present

## 2017-11-06 DIAGNOSIS — R2689 Other abnormalities of gait and mobility: Secondary | ICD-10-CM | POA: Diagnosis present

## 2017-11-06 DIAGNOSIS — R51 Headache: Secondary | ICD-10-CM | POA: Diagnosis not present

## 2017-11-06 DIAGNOSIS — Z09 Encounter for follow-up examination after completed treatment for conditions other than malignant neoplasm: Secondary | ICD-10-CM

## 2017-11-06 DIAGNOSIS — I120 Hypertensive chronic kidney disease with stage 5 chronic kidney disease or end stage renal disease: Secondary | ICD-10-CM | POA: Diagnosis not present

## 2017-11-06 DIAGNOSIS — E11621 Type 2 diabetes mellitus with foot ulcer: Secondary | ICD-10-CM | POA: Diagnosis present

## 2017-11-06 DIAGNOSIS — A419 Sepsis, unspecified organism: Secondary | ICD-10-CM | POA: Diagnosis not present

## 2017-11-06 HISTORY — DX: Type 2 diabetes mellitus with diabetic autonomic (poly)neuropathy: E11.43

## 2017-11-06 HISTORY — DX: Gastroparesis: K31.84

## 2017-11-06 LAB — HEPATIC FUNCTION PANEL
ALT: 11 U/L (ref 0–44)
AST: 16 U/L (ref 15–41)
Albumin: 3.1 g/dL — ABNORMAL LOW (ref 3.5–5.0)
Alkaline Phosphatase: 75 U/L (ref 38–126)
Bilirubin, Direct: 0.3 mg/dL — ABNORMAL HIGH (ref 0.0–0.2)
Indirect Bilirubin: 1.4 mg/dL — ABNORMAL HIGH (ref 0.3–0.9)
Total Bilirubin: 1.7 mg/dL — ABNORMAL HIGH (ref 0.3–1.2)
Total Protein: 7.4 g/dL (ref 6.5–8.1)

## 2017-11-06 LAB — I-STAT VENOUS BLOOD GAS, ED
Acid-Base Excess: 8 mmol/L — ABNORMAL HIGH (ref 0.0–2.0)
Bicarbonate: 34.9 mmol/L — ABNORMAL HIGH (ref 20.0–28.0)
O2 Saturation: 42 %
TCO2: 37 mmol/L — ABNORMAL HIGH (ref 22–32)
pCO2, Ven: 60.9 mmHg — ABNORMAL HIGH (ref 44.0–60.0)
pH, Ven: 7.366 (ref 7.250–7.430)
pO2, Ven: 25 mmHg — CL (ref 32.0–45.0)

## 2017-11-06 LAB — DIFFERENTIAL
Abs Immature Granulocytes: 0 10*3/uL (ref 0.0–0.1)
Basophils Absolute: 0.1 10*3/uL (ref 0.0–0.1)
Basophils Relative: 1 %
Eosinophils Absolute: 0.2 10*3/uL (ref 0.0–0.7)
Eosinophils Relative: 2 %
Immature Granulocytes: 0 %
Lymphocytes Relative: 16 %
Lymphs Abs: 1.2 10*3/uL (ref 0.7–4.0)
Monocytes Absolute: 0.9 10*3/uL (ref 0.1–1.0)
Monocytes Relative: 13 %
Neutro Abs: 4.8 10*3/uL (ref 1.7–7.7)
Neutrophils Relative %: 68 %

## 2017-11-06 LAB — BASIC METABOLIC PANEL
Anion gap: 15 (ref 5–15)
BUN: 32 mg/dL — ABNORMAL HIGH (ref 6–20)
CO2: 28 mmol/L (ref 22–32)
Calcium: 9.5 mg/dL (ref 8.9–10.3)
Chloride: 95 mmol/L — ABNORMAL LOW (ref 98–111)
Creatinine, Ser: 11.46 mg/dL — ABNORMAL HIGH (ref 0.61–1.24)
GFR calc Af Amer: 5 mL/min — ABNORMAL LOW (ref >60)
GFR calc non Af Amer: 5 mL/min — ABNORMAL LOW (ref >60)
Glucose, Bld: 70 mg/dL (ref 70–99)
Potassium: 4 mmol/L (ref 3.5–5.1)
Sodium: 138 mmol/L (ref 135–145)

## 2017-11-06 LAB — LIPASE, BLOOD: Lipase: 25 U/L (ref 11–51)

## 2017-11-06 LAB — CBC
HCT: 36 % — ABNORMAL LOW (ref 39.0–52.0)
Hemoglobin: 11.1 g/dL — ABNORMAL LOW (ref 13.0–17.0)
MCH: 31.4 pg (ref 26.0–34.0)
MCHC: 30.8 g/dL (ref 30.0–36.0)
MCV: 102 fL — ABNORMAL HIGH (ref 78.0–100.0)
Platelets: 162 10*3/uL (ref 150–400)
RBC: 3.53 MIL/uL — ABNORMAL LOW (ref 4.22–5.81)
RDW: 15.6 % — ABNORMAL HIGH (ref 11.5–15.5)
WBC: 7.1 10*3/uL (ref 4.0–10.5)

## 2017-11-06 LAB — BRAIN NATRIURETIC PEPTIDE: B Natriuretic Peptide: 1323.3 pg/mL — ABNORMAL HIGH (ref 0.0–100.0)

## 2017-11-06 LAB — CBG MONITORING, ED
Glucose-Capillary: 58 mg/dL — ABNORMAL LOW (ref 70–99)
Glucose-Capillary: 94 mg/dL (ref 70–99)
Glucose-Capillary: 75 mg/dL (ref 70–99)

## 2017-11-06 LAB — I-STAT CG4 LACTIC ACID, ED
Lactic Acid, Venous: 1.22 mmol/L (ref 0.5–1.9)
Lactic Acid, Venous: 1.34 mmol/L (ref 0.5–1.9)

## 2017-11-06 LAB — TROPONIN I: Troponin I: 0.07 ng/mL (ref <0.03)

## 2017-11-06 LAB — POC OCCULT BLOOD, ED: Fecal Occult Bld: NEGATIVE

## 2017-11-06 MED ORDER — PIPERACILLIN-TAZOBACTAM IN DEX 2-0.25 GM/50ML IV SOLN: 2.2500 g | Freq: Two times a day (BID) | INTRAVENOUS | Status: DC

## 2017-11-06 MED ORDER — ACETAMINOPHEN-CODEINE #3 300-30 MG PO TABS
1.0000 | ORAL_TABLET | Freq: Every day | ORAL | Status: DC
  Administered 2017-11-06 – 2017-11-10 (×5): 1 via ORAL
  Filled 2017-11-06 (×5): qty 1

## 2017-11-06 MED ORDER — SODIUM CHLORIDE 0.9 % IV SOLN
2.0000 g | Freq: Once | INTRAVENOUS | Status: AC
  Administered 2017-11-06: 2 g via INTRAVENOUS
  Filled 2017-11-06: qty 2

## 2017-11-06 MED ORDER — TRAVOPROST (BAK FREE) 0.004 % OP SOLN
1.0000 [drp] | Freq: Every day | OPHTHALMIC | Status: DC
  Administered 2017-11-06 – 2017-11-10 (×5): 1 [drp] via OPHTHALMIC
  Filled 2017-11-06: qty 2.5

## 2017-11-06 MED ORDER — INSULIN ASPART 100 UNIT/ML ~~LOC~~ SOLN
0.0000 [IU] | Freq: Three times a day (TID) | SUBCUTANEOUS | Status: DC
  Administered 2017-11-07: 2 [IU] via SUBCUTANEOUS
  Administered 2017-11-08: 1 [IU] via SUBCUTANEOUS
  Administered 2017-11-08: 2 [IU] via SUBCUTANEOUS
  Administered 2017-11-08 – 2017-11-10 (×3): 1 [IU] via SUBCUTANEOUS
  Administered 2017-11-11 (×2): 2 [IU] via SUBCUTANEOUS

## 2017-11-06 MED ORDER — SODIUM CHLORIDE 0.9 % IV SOLN: 250.0000 mL | INTRAVENOUS | Status: DC | PRN

## 2017-11-06 MED ORDER — AMLODIPINE BESYLATE 10 MG PO TABS
10.0000 mg | ORAL_TABLET | Freq: Every day | ORAL | Status: DC
  Administered 2017-11-07 – 2017-11-08 (×2): 10 mg via ORAL
  Filled 2017-11-06: qty 1

## 2017-11-06 MED ORDER — PANTOPRAZOLE SODIUM 40 MG PO TBEC
40.0000 mg | DELAYED_RELEASE_TABLET | Freq: Every day | ORAL | Status: DC
  Administered 2017-11-07 – 2017-11-11 (×5): 40 mg via ORAL
  Filled 2017-11-06 (×4): qty 1

## 2017-11-06 MED ORDER — ACETAMINOPHEN 325 MG PO TABS: 650.0000 mg | ORAL_TABLET | Freq: Four times a day (QID) | ORAL | Status: DC | PRN

## 2017-11-06 MED ORDER — SUCROFERRIC OXYHYDROXIDE 500 MG PO CHEW
1500.0000 mg | CHEWABLE_TABLET | Freq: Three times a day (TID) | ORAL | Status: DC
  Administered 2017-11-07 – 2017-11-11 (×9): 1500 mg via ORAL
  Filled 2017-11-06 (×16): qty 3

## 2017-11-06 MED ORDER — SUCROFERRIC OXYHYDROXIDE 500 MG PO CHEW
1000.0000 mg | CHEWABLE_TABLET | Freq: Every day | ORAL | Status: DC | PRN
  Filled 2017-11-06: qty 2

## 2017-11-06 MED ORDER — CHLORHEXIDINE GLUCONATE CLOTH 2 % EX PADS
6.0000 | MEDICATED_PAD | Freq: Every day | CUTANEOUS | Status: DC
  Administered 2017-11-07 – 2017-11-09 (×3): 6 via TOPICAL

## 2017-11-06 MED ORDER — SODIUM CHLORIDE 0.9% FLUSH: 3.0000 mL | INTRAVENOUS | Status: DC | PRN

## 2017-11-06 MED ORDER — BRIMONIDINE TARTRATE 0.2 % OP SOLN
1.0000 [drp] | Freq: Two times a day (BID) | OPHTHALMIC | Status: DC
  Administered 2017-11-06 – 2017-11-11 (×9): 1 [drp] via OPHTHALMIC
  Filled 2017-11-06: qty 5

## 2017-11-06 MED ORDER — TIMOLOL MALEATE 0.5 % OP SOLN
1.0000 [drp] | Freq: Two times a day (BID) | OPHTHALMIC | Status: DC
  Administered 2017-11-06 – 2017-11-11 (×9): 1 [drp] via OPHTHALMIC
  Filled 2017-11-06: qty 5

## 2017-11-06 MED ORDER — VANCOMYCIN HCL IN DEXTROSE 1-5 GM/200ML-% IV SOLN
1000.0000 mg | INTRAVENOUS | Status: DC
  Administered 2017-11-09 – 2017-11-11 (×2): 1000 mg via INTRAVENOUS
  Filled 2017-11-06 (×3): qty 200

## 2017-11-06 MED ORDER — TRAMADOL HCL 50 MG PO TABS: 50.0000 mg | ORAL_TABLET | Freq: Two times a day (BID) | ORAL | Status: DC | PRN

## 2017-11-06 MED ORDER — VANCOMYCIN HCL 10 G IV SOLR
2000.0000 mg | Freq: Once | INTRAVENOUS | Status: AC
  Administered 2017-11-06: 2000 mg via INTRAVENOUS
  Filled 2017-11-06: qty 2000

## 2017-11-06 MED ORDER — ONDANSETRON 4 MG PO TBDP: 8.0000 mg | ORAL_TABLET | Freq: Three times a day (TID) | ORAL | Status: DC | PRN

## 2017-11-06 MED ORDER — ONDANSETRON HCL 4 MG/2ML IJ SOLN: 4.0000 mg | Freq: Four times a day (QID) | INTRAMUSCULAR | Status: DC | PRN

## 2017-11-06 MED ORDER — FERRIC CITRATE 1 GM 210 MG(FE) PO TABS
1050.0000 mg | ORAL_TABLET | Freq: Three times a day (TID) | ORAL | Status: DC
  Administered 2017-11-07 – 2017-11-11 (×10): 1050 mg via ORAL
  Filled 2017-11-06 (×15): qty 5

## 2017-11-06 MED ORDER — SODIUM CHLORIDE 0.9 % IV SOLN: 2.0000 g | INTRAVENOUS | Status: DC

## 2017-11-06 MED ORDER — ACETAMINOPHEN 650 MG RE SUPP: 650.0000 mg | Freq: Four times a day (QID) | RECTAL | Status: DC | PRN

## 2017-11-06 MED ORDER — METOPROLOL SUCCINATE ER 100 MG PO TB24
100.0000 mg | ORAL_TABLET | Freq: Every day | ORAL | Status: DC
  Administered 2017-11-07 – 2017-11-08 (×2): 100 mg via ORAL
  Filled 2017-11-06: qty 1

## 2017-11-06 MED ORDER — DOXERCALCIFEROL 4 MCG/2ML IV SOLN
7.0000 ug | INTRAVENOUS | Status: DC
  Administered 2017-11-09 – 2017-11-11 (×2): 7 ug via INTRAVENOUS
  Filled 2017-11-06 (×4): qty 4

## 2017-11-06 MED ORDER — IOPAMIDOL (ISOVUE-370) INJECTION 76%
INTRAVENOUS | Status: AC
  Administered 2017-11-06: 100 mL
  Filled 2017-11-06: qty 100

## 2017-11-06 MED ORDER — POLYETHYLENE GLYCOL 3350 17 G PO PACK: 17.0000 g | PACK | Freq: Every day | ORAL | Status: DC | PRN

## 2017-11-06 MED ORDER — METOCLOPRAMIDE HCL 5 MG PO TABS
5.0000 mg | ORAL_TABLET | Freq: Three times a day (TID) | ORAL | Status: DC
  Administered 2017-11-07 – 2017-11-09 (×7): 5 mg via ORAL
  Filled 2017-11-06 (×8): qty 1

## 2017-11-06 MED ORDER — CHLORHEXIDINE GLUCONATE CLOTH 2 % EX PADS
6.0000 | MEDICATED_PAD | Freq: Every day | CUTANEOUS | Status: DC
  Administered 2017-11-07: 6 via TOPICAL

## 2017-11-06 MED ORDER — HEPARIN SODIUM (PORCINE) 5000 UNIT/ML IJ SOLN
5000.0000 [IU] | Freq: Three times a day (TID) | INTRAMUSCULAR | Status: DC
  Administered 2017-11-06 – 2017-11-11 (×12): 5000 [IU] via SUBCUTANEOUS
  Filled 2017-11-06 (×14): qty 1

## 2017-11-06 MED ORDER — DEXTROSE 50 % IV SOLN
1.0000 | Freq: Once | INTRAVENOUS | Status: AC
  Administered 2017-11-06: 50 mL via INTRAVENOUS

## 2017-11-06 MED ORDER — PIPERACILLIN-TAZOBACTAM 3.375 G IVPB
3.3750 g | Freq: Two times a day (BID) | INTRAVENOUS | Status: DC
  Administered 2017-11-06 – 2017-11-10 (×9): 3.375 g via INTRAVENOUS
  Filled 2017-11-06 (×11): qty 50

## 2017-11-06 MED ORDER — DEXTROSE 50 % IV SOLN
INTRAVENOUS | Status: AC
  Filled 2017-11-06: qty 50

## 2017-11-06 MED ORDER — ONDANSETRON HCL 4 MG PO TABS: 4.0000 mg | ORAL_TABLET | Freq: Four times a day (QID) | ORAL | Status: DC | PRN

## 2017-11-06 MED ORDER — SODIUM CHLORIDE 0.9% FLUSH
3.0000 mL | Freq: Two times a day (BID) | INTRAVENOUS | Status: DC
  Administered 2017-11-07 – 2017-11-10 (×8): 3 mL via INTRAVENOUS

## 2017-11-06 NOTE — ED Notes (Signed)
Pt began having sx of confusion and ataxia that began yesterday with onset of headache this morning.

## 2017-11-06 NOTE — ED Notes (Signed)
Admitting MD at bedside.

## 2017-11-06 NOTE — H&P (Signed)
History and Physical    Treyton Slimp NWG:[id number] DOB: 30-Jan-1966 DOA: 11/06/2017  PCP: Horald Pollen, MD   Patient coming from: home    Chief Complaint: AMS, N/V, gait instability  HPI: Howard Bell is a 52 y.o. male with medical history significant of type 2 diabetes on insulin, ESRD on Monday Wednesday Friday dialysis, hypertension, diabetic gastroparesis, unspecified congestive heart failure, who comes in with altered mental status, nausea, vomiting and found to have multifocal pneumonia with acute hypoxic respiratory failure.  Patient's wife reports that for the past 3 to 4 months he has had a chronic cough that is occasionally productive of small amounts of clear mucus.  He is currently pending an evaluation for this.  Additionally he has repeated episodes of nausea and vomiting and is pending an EGD for this.  His symptoms recently over the past week have gotten worse with a worsening cough as well as more frequent episodes of nausea and vomiting.  His emesis is nonbilious and nonbloody.  It is usually after eating.  He is only on sliding scale insulin at home and he is not been giving himself any insulin as he is been unable to eat much for the past week.  Yesterday patient became altered and increasingly somnolent.  He was also noted to be weak and confused by his wife.  The character of his cough and the amount of sputum reproduce has not changed dramatically.  She reported that he felt warm and had chills.  He did not have rigors.  His temperature was 99.1 which is high for him per his wife.  His blood sugars have not been checked at home as he is not using his sliding scale insulin as he is not eating much.  ED Course: In the ED patient's vitals are notable for hypoxia and mild hypertension.  Patient was mildly hypoglycemic to 58 and received an amp of D50 with improvement in mental status.  Labs are notable for creatinine of 11.46, hemoglobin 11.1.  Troponin was 0.07.   White blood cell count was normal.  Contrasted head CT showed extensive chronic ischemia but no acute process.  X-ray showed hypoinflation.  CTA PE protocol showed no evidence of pulmonary embolism but did show findings concerning for multifocal pneumonia.  Review of Systems: As per HPI otherwise 10 point review of systems negative.    Past Medical History:  Diagnosis Date  . Congestive heart failure (CHF) ([place])   . 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 Diabetic gastroparesis ([place]) 11/06/2017  . ESRD (end stage renal disease) on dialysis ([place])   . Hypertension     Past Surgical History:  Procedure Laterality Date  . HERNIA REPAIR    . IR AV DIALY SHUNT INTRO NEEDLE/INTRACATH INITIAL W/PTA/IMG LEFT  03/30/2017     reports that he has never smoked. He has never used smokeless tobacco. He reports that he does not drink alcohol or use drugs.  No Known Allergies  Family History  Problem Relation Age of Onset  . Diabetes Mother   . Hypertension Father    Unacceptable: Noncontributory, unremarkable, or negative. Acceptable: Family history reviewed and not pertinent (If you reviewed it)  Prior to Admission medications   Medication Sig Start Date End Date Taking? Authorizing Provider  acetaminophen-codeine (TYLENOL #3) 300-30 MG tablet Take 1 tablet by mouth at bedtime. 11/01/17  Yes Danis, Kirke Corin, MD  amLODipine (NORVASC) 10 MG tablet Take 1 tablet (10 mg total)  by mouth daily. 03/30/17  Yes Hongalgi, Lenis Dickinson, MD  brimonidine (ALPHAGAN) 0.2 % ophthalmic solution Place 1 drop into both eyes 2 (two) times daily.   Yes [provider]  insulin lispro (HUMALOG KWIKPEN) 100 UNIT/ML KiwkPen Inject 0-0.09 mLs (0-9 Units total) into the skin 3 (three) times daily with meals. CBG < 70: Eat or drink something sweet and recheck. CBG 70 - 120: 0 units CBG 121 - 150: 1 unit CBG 151 - 200: 2 units CBG 201 - 250: 3 units CBG 251 - 300: 5 units CBG 301 -  350: 7 units CBG 351 - 400: 9 units CBG > 400: call MD. Patient taking differently: Inject 0-9 Units into the skin 3 (three) times daily with meals. CBG 70 - 120: 0 units CBG 121 - 150: 1 unit CBG 151 - 200: 2 units CBG 201 - 250: 3 units CBG 251 - 300: 5 units CBG 301 - 350: 7 units CBG 351 - 400: 9 units CBG < 70: Eat or drink something sweet and recheck. CBG > 400: call MD. 03/30/17  Yes Hongalgi, Lenis Dickinson, MD  metoCLOPramide (REGLAN) 5 MG tablet Take 1 tablet (5 mg total) by mouth 3 (three) times daily before meals. 09/23/17  Yes Danis, Kirke Corin, MD  metoprolol succinate (TOPROL-XL) 100 MG 24 hr tablet Take 1 tablet (100 mg total) by mouth daily. Take with or immediately following a meal. 03/30/17  Yes Hongalgi, Lenis Dickinson, MD  omeprazole (PRILOSEC) 40 MG capsule TAKE 1 CAPSULE(40 MG) BY MOUTH DAILY Patient taking differently: Take 40 mg by mouth daily.  10/19/17  Yes Rutherford Guys, MD  ondansetron (ZOFRAN ODT) 8 MG disintegrating tablet Take 1 tablet (8 mg total) by mouth every 8 (eight) hours as needed for nausea or vomiting. 09/20/17  Yes Rutherford Guys, MD  timolol (BETIMOL) 0.5 % ophthalmic solution Place 1 drop into both eyes 2 (two) times daily. 05/07/17  Yes English, Colletta Maryland D, PA  travoprost, benzalkonium, (TRAVATAN) 0.004 % ophthalmic solution Place 1 drop into both eyes at bedtime.   Yes [provider]  AURYXIA 1 GM 210 MG(Fe) tablet Take 1 g by mouth See admin instructions. TAKE 3 TABLETS BY MOUTH THREE TIMES A DAY WITH MEALS AND 1 TABLET TWICE A DAY WITH SNACKS.  SWALLOW WHOLE, DO NOT CHEW OR CRUSH MEDICATION. 10/20/17   [provider]  blood glucose meter kit and supplies KIT Per insurance preference. Check glucose TID. E11.65, Z79.4 10/02/17   Rutherford Guys, MD  traMADol (ULTRAM) 50 MG tablet Take 1 tablet (50 mg total) by mouth every 12 (twelve) hours as needed. Patient not taking: Reported on 11/06/2017 09/23/17   Doran Stabler, MD    Physical  Exam: Vitals:   11/06/17 1045 11/06/17 1100 11/06/17 1115 11/06/17 1145  BP: (!) 164/67 (!) 158/64 (!) 156/52 (!) 153/71  Pulse:  92 92 90  Resp:  14 16   Temp:      TempSrc:      SpO2:  95% 95% 92%  Weight:      Height:        Constitutional: NAD, calm, comfortable Vitals:   11/06/17 1045 11/06/17 1100 11/06/17 1115 11/06/17 1145  BP: (!) 164/67 (!) 158/64 (!) 156/52 (!) 153/71  Pulse:  92 92 90  Resp:  14 16   Temp:      TempSrc:      SpO2:  95% 95% 92%  Weight:  Height:       Eyes: Anicteric sclera ENMT: Dry mucous membranes, poor dentition Neck: Pickwickian neck Respiratory: No increased work of breathing, scattered rhonchi's in bilateral lung fields worse on right, no wheezes or crackles.  Cardiovascular: Stent heart sounds, referred 2 out of 6 systolic murmur,.  Abdomen: no tenderness, no masses palpated. No hepatosplenomegaly. Bowel sounds positive.  Musculoskeletal: Left upper extremity fistula with palpable thrill and bruit, no lower extremity edema.  Skin: no rashes visible skin Neurologic: CN 2-12 grossly intact. Sensation intact, DTR normal. Strength 5/5 in all 4.  Patient appears to be very slowed but is alert and oriented x3. Psychiatric: Cannot assess due to patient's mental status   Labs on Admission: I have personally reviewed following labs and imaging studies  CBC: Recent Labs  Lab 11/06/17 0750  WBC 7.1  NEUTROABS 4.8  HGB 11.1*  HCT 36.0*  MCV 102.0*  PLT 017   Basic Metabolic Panel: Recent Labs  Lab 11/06/17 0750  NA 138  K 4.0  CL 95*  CO2 28  GLUCOSE 70  BUN 32*  CREATININE 11.46*  CALCIUM 9.5   GFR: Estimated Creatinine Clearance: 9.3 mL/min (A) (by C-G formula based on SCr of 11.46 mg/dL (H)). Liver Function Tests: Recent Labs  Lab 11/06/17 0750  AST 16  ALT 11  ALKPHOS 75  BILITOT 1.7*  PROT 7.4  ALBUMIN 3.1*   Recent Labs  Lab 11/06/17 0750  LIPASE 25   No results for input(s): AMMONIA in the last 168  hours. Coagulation Profile: No results for input(s): INR, PROTIME in the last 168 hours. Cardiac Enzymes: Recent Labs  Lab 11/06/17 0856  TROPONINI 0.07*   BNP (last 3 results) No results for input(s): PROBNP in the last 8760 hours. HbA1C: No results for input(s): HGBA1C in the last 72 hours. CBG: Recent Labs  Lab 11/06/17 0935 11/06/17 1127  GLUCAP 58* 94   Lipid Profile: No results for input(s): CHOL, HDL, LDLCALC, TRIG, CHOLHDL, LDLDIRECT in the last 72 hours. Thyroid Function Tests: No results for input(s): TSH, T4TOTAL, FREET4, T3FREE, THYROIDAB in the last 72 hours. Anemia Panel: No results for input(s): VITAMINB12, FOLATE, FERRITIN, TIBC, IRON, RETICCTPCT in the last 72 hours. Urine analysis:    Component Value Date/Time   COLORURINE RED (A) 11/19/2016 0020   APPEARANCEUR TURBID (A) 11/19/2016 0020   LABSPEC  11/19/2016 0020    TEST NOT REPORTED DUE TO COLOR INTERFERENCE OF URINE PIGMENT   PHURINE  11/19/2016 0020    TEST NOT REPORTED DUE TO COLOR INTERFERENCE OF URINE PIGMENT   GLUCOSEU (A) 11/19/2016 0020    TEST NOT REPORTED DUE TO COLOR INTERFERENCE OF URINE PIGMENT   HGBUR (A) 11/19/2016 0020    TEST NOT REPORTED DUE TO COLOR INTERFERENCE OF URINE PIGMENT   BILIRUBINUR (A) 11/19/2016 0020    TEST NOT REPORTED DUE TO COLOR INTERFERENCE OF URINE PIGMENT   KETONESUR (A) 11/19/2016 0020    TEST NOT REPORTED DUE TO COLOR INTERFERENCE OF URINE PIGMENT   PROTEINUR (A) 11/19/2016 0020    TEST NOT REPORTED DUE TO COLOR INTERFERENCE OF URINE PIGMENT   NITRITE (A) 11/19/2016 0020    TEST NOT REPORTED DUE TO COLOR INTERFERENCE OF URINE PIGMENT   LEUKOCYTESUR (A) 11/19/2016 0020    TEST NOT REPORTED DUE TO COLOR INTERFERENCE OF URINE PIGMENT    Radiological Exams on Admission: Dg Chest 2 View  Result Date: 11/06/2017 CLINICAL DATA:  Feeling sick with dizziness and vomiting. Altered mental status.  Dialysis. EXAM: CHEST - 2 VIEW COMPARISON:  09/13/2017 and  03/28/2017 FINDINGS: Lungs are adequately inflated demonstrate mild chronic hazy prominence of the right perihilar markings. No lobar consolidation or effusion. Stable cardiomegaly. Left subclavian stents are present. Remainder of the exam is unchanged. IMPRESSION: Hypoinflation without acute cardiopulmonary disease. Stable prominence of the right perihilar markings. Stable cardiomegaly. Electronically Signed   By: Marin Olp M.D.   On: 11/06/2017 09:32   Ct Head Wo Contrast  Result Date: 11/06/2017 CLINICAL DATA:  Severe headache.  Worst headache of life EXAM: CT HEAD WITHOUT CONTRAST TECHNIQUE: Contiguous axial images were obtained from the base of the skull through the vertex without intravenous contrast. COMPARISON:  None. FINDINGS: Brain: Mild to moderate atrophy.  Negative for hydrocephalus. Extensive chronic ischemic changes. Chronic microvascular ischemia in the white matter bilaterally. 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 Chronic infarct in the left occipital lobe. Extensive chronic infarct in the right superior cerebellum. Negative for acute infarct, hemorrhage, or mass. Benign-appearing calcifications in the basal ganglia and posterior fossa Vascular: Atherosclerotic calcification. Negative for hyperdense vessel Skull: Negative Sinuses/Orbits: Bilateral cataract surgery.  Paranasal sinuses clear Other: None IMPRESSION: Atrophy and extensive chronic ischemia.  No acute abnormality. Electronically Signed   By: Franchot Gallo M.D.   On: 11/06/2017 09:54   Ct Angio Chest Pe W And/or Wo Contrast  Result Date: 11/06/2017 CLINICAL DATA:  Shortness of breath. End-stage renal disease. Diabetes. CHF. EXAM: CT ANGIOGRAPHY CHEST WITH CONTRAST TECHNIQUE: Multidetector CT imaging of the chest was performed using the standard protocol during bolus administration of intravenous contrast. Multiplanar CT image reconstructions and MIPs were obtained to evaluate the vascular anatomy. CONTRAST:   144m ISOVUE-370 IOPAMIDOL (ISOVUE-370) INJECTION 76% COMPARISON:  09/06/2017 chest radiograph.  No prior CT. FINDINGS: Cardiovascular: The quality of this exam for evaluation of pulmonary embolism is moderate. Limitations include mild motion, EKG wires and leads. No evidence of pulmonary embolism. A left subclavian venous stent. Aortic and branch vessel atherosclerosis. Tortuous thoracic aorta. Moderate cardiomegaly, without pericardial effusion. Multivessel coronary artery atherosclerosis. Pulmonary artery enlargement, outflow tract 3.5 cm Mediastinum/Nodes: Left supraclavicular node measures 10 mm on image 11/5. No axillary adenopathy. Right paratracheal node measures 1.8 cm on 31/5. A node within the subcarinal station with extension into the azygoesophageal recess measures 3.0 x 2.5 cm on 47/5. Mild prevascular adenopathy. No hilar adenopathy. Lungs/Pleura: No pleural fluid. Extensive artifact from overlying EKG wires and leads. Diffuse air space and less so ground-glass opacity, slightly right lower lobe predominant. Upper Abdomen: Normal imaged portions of the liver, spleen, stomach, pancreas, biliary tract, right adrenal gland. Mild left adrenal nodularity, 1.4 cm. Similar to 11/18/2016. Renal atrophy. Advanced abdominal aortic and branch vessel atherosclerosis. Musculoskeletal: Right greater than left mild gynecomastia. Renal osteodystrophy. Review of the MIP images confirms the above findings. IMPRESSION: 1. Multifactorial degradation, including motion and support apparatus artifact. 2. No pulmonary embolism with above limitations. 3. Right greater than left and lower lobe predominant multifocal airspace and ground-glass opacity, most consistent with pneumonia. 4. Thoracic and supraclavicular adenopathy, most likely reactive. Given the extent of adenopathy, consider chest CT follow-up at 3-6 months. 5.  Aortic Atherosclerosis (ICD10-I70.0). 6. Pulmonary artery enlargement suggests pulmonary arterial  hypertension. 7. Age advanced coronary artery atherosclerosis. Recommend assessment of coronary risk factors and consideration of medical therapy. 8. Left adrenal nodule which is technically indeterminate but given stability to 11/18/2016 is favored to be benign, most likely an adenoma. Electronically Signed   By: KAbigail Miyamoto  M.D.   On: 11/06/2017 13:44    EKG: Independently reviewed.  Sinus rhythm, poor R wave progression, incomplete left bundle branch block, unchanged from prior  Assessment/Plan Principal Problem:   Multifocal pneumonia Active Problems:   Acute respiratory failure with hypoxemia (HCC)   Diabetes mellitus with complication (HCC)   Elevated troponin   Essential hypertension   ESRD on dialysis ([place])   Altered mental status   Diabetic gastroparesis ([place])    #) Acute hypoxic respiratory failure due to multifocal pneumonia: With repeated reports of patient having episodes of emesis unclear if this multifocal pneumonia secondary to aspiration versus true multifocal pneumonia.  He might benefit from evaluation of his procalcitonin.  Regardless we will treat empirically for now broadly with both aspiration and hospital-acquired pneumonia. - Start Zosyn and vancomycin on 11/06/2017 - Follow-up blood cultures obtained on 11/06/2017 -Sputum culture ordered -Procalcitonin ordered -Speech line which pathology consult  #) Altered mental status: This appears to be secondary to hypoglycemia likely in the setting of very poor p.o. intake and infection.  His head CT is negative.  He has no localizing signs or symptoms of a stroke. -Monitor  #) Diabetic gastroparesis: Likely the most likely etiology of his chronic nausea and vomiting. -Nutrition consult - Continue home metoclopramide 5 mg 3 times daily  #) Hypertension: -Continue amlodipine 10 mg daily -Continue metoprolol succinate 100 mg daily  #) ESRD comp gated by hypoproliferative anemia: - Nephrology consult -Continue iron  supplementation  #) Type 2 diabetes on insulin: -Sliding scale insulin -Carb/renal diet  Fluids: Tolerating p.o. Electrodes: Moderate supplement Nutrition: Renal/carb restricted diet  Prophylaxis: Subcu heparin  Disposition: Pending resolution of altered mental status and tolerating p.o.  Full code    Cristy Folks MD [facility]   If 7PM-7AM, please contact night-coverage [email or web address] Password [email or web address]  11/06/2017, 2:30 PM

## 2017-11-06 NOTE — Progress Notes (Addendum)
Pharmacy Antibiotic Note  Howard Bell is a 52 y.o. male admitted on 11/06/2017 with pneumonia.  Pharmacy has been consulted for vancomycin dosing.  ESRD on HD MWF. Last HD session was on 8/16.   Plan: Start Zosyn 3.375 gm IV q12h (4 hour infusion) Start vancomycin 1g IV QHD-MWF Monitor clinical picture, renal function, pre-HD VR prn F/U C&S, abx deescalation / LOT   Height: 6' (182.9 cm) Weight: 214 lb (97.1 kg) IBW/kg (Calculated) : 77.6  Temp (24hrs), Avg:99.1 F (37.3 C), Min:99.1 F (37.3 C), Max:99.1 F (37.3 C)  Recent Labs  Lab 11/06/17 0750 11/06/17 0751 11/06/17 0945  WBC 7.1  --   --   CREATININE 11.46*  --   --   LATICACIDVEN  --  1.22 1.34    Estimated Creatinine Clearance: 9.3 mL/min (A) (by C-G formula based on SCr of 11.46 mg/dL (H)).    No Known Allergies  Thank you for allowing pharmacy to be a part of this patient's care.  Reginia Naas 11/06/2017 2:07 PM

## 2017-11-06 NOTE — ED Notes (Signed)
Attempted to call report; nurse will call be back.

## 2017-11-06 NOTE — ED Notes (Signed)
Went to check on patient; he had removed oxygen cannula. Room air sats were 64%. Patient and family advised to keep on nasal cannula.

## 2017-11-06 NOTE — ED Notes (Signed)
Pt taken to xray and CT 

## 2017-11-06 NOTE — ED Notes (Addendum)
Per family, Pt. Does not make any urine. Dialysis

## 2017-11-06 NOTE — ED Provider Notes (Signed)
[facility] EMERGENCY DEPARTMENT Provider Note   CSN: [id number] Arrival date & time: 11/06/17  0720     History   Chief Complaint Chief Complaint  Patient presents with  . Dizziness  . Altered Mental Status  . Emesis  . Anorexia  . Shortness of Breath    HPI Howard Bell is a 52 y.o. male.  HPI   Level 5 caveat due to altered mental status.  Howard Bell is a 52 y.o. male, with a history of CHF, DM, ESRD, and HTN, presenting to the ED with altered mental status.  Patient's wife gives much of the history. States beginning yesterday patient became disoriented. "He didn't know he was at home yesterday or this morning. Yesterday, he was wandering around saying he was looking for the light switch. This morning he didn't know the date and seemed unsteady on his feet.  The last time he was like this it was because he was septic." Nausea, vomiting, and subjective fever for the past week.  Loose stools over the last couple days.  Dark stools for about 3 months.   Endorses a cough, but states this has been going on for several months.  Also states he has been evaluated by [facility] for several months of epigastric pain; endoscopy scheduled for September 16.  Denies known falls, chest pain, shortness of breath, current abdominal pain, hematochezia, hematemesis, peripheral edema, orthopnea, or any other complaints.  Dialysis on Mon, Wed, Fri with last session Aug 16. Receives dialysis at center at [facility].      Past Medical History:  Diagnosis Date  . Congestive heart failure (CHF) ([place])   . 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 ESRD (end stage renal disease) on dialysis ([place])   . Hypertension     Patient Active Problem List   Diagnosis Date Noted  . Generalized abdominal pain 09/13/2017  . Acute pain of right shoulder 07/26/2017  . Acute bursitis of right shoulder 07/26/2017  . Hyperkalemia 03/28/2017  . Left  arm swelling 03/28/2017  . ESRD on dialysis ([place]) 03/28/2017  . Hypertensive urgency   . Chest pain 11/26/2016  . Essential hypertension 11/26/2016  . ESRD needing dialysis ([place]) 08/04/2016  . Acute respiratory failure with hypoxemia ([place]) 08/04/2016  . Diabetes mellitus with complication ([place]) 83/38/2505  . Acute CHF (congestive heart failure) ([place]) 08/04/2016  . Hypertensive emergency 08/04/2016  . Elevated troponin     Past Surgical History:  Procedure Laterality Date  . HERNIA REPAIR    . IR AV DIALY SHUNT INTRO NEEDLE/INTRACATH INITIAL W/PTA/IMG LEFT  03/30/2017        Home Medications    Prior to Admission medications   Medication Sig Start Date End Date Taking? Authorizing Provider  acetaminophen-codeine (TYLENOL #3) 300-30 MG tablet Take 1 tablet by mouth at bedtime. 11/01/17  Yes Danis, Kirke Corin, MD  amLODipine (NORVASC) 10 MG tablet Take 1 tablet (10 mg total) by mouth daily. 03/30/17  Yes Hongalgi, Lenis Dickinson, MD  brimonidine (ALPHAGAN) 0.2 % ophthalmic solution Place 1 drop into both eyes 2 (two) times daily.   Yes [provider]  insulin lispro (HUMALOG KWIKPEN) 100 UNIT/ML KiwkPen Inject 0-0.09 mLs (0-9 Units total) into the skin 3 (three) times daily with meals. CBG < 70: Eat or drink something sweet and recheck. CBG 70 - 120: 0 units CBG 121 - 150: 1 unit CBG 151 - 200: 2 units CBG 201 - 250: 3  units CBG 251 - 300: 5 units CBG 301 - 350: 7 units CBG 351 - 400: 9 units CBG > 400: call MD. Patient taking differently: Inject 0-9 Units into the skin 3 (three) times daily with meals. CBG 70 - 120: 0 units CBG 121 - 150: 1 unit CBG 151 - 200: 2 units CBG 201 - 250: 3 units CBG 251 - 300: 5 units CBG 301 - 350: 7 units CBG 351 - 400: 9 units CBG < 70: Eat or drink something sweet and recheck. CBG > 400: call MD. 03/30/17  Yes Hongalgi, Lenis Dickinson, MD  metoCLOPramide (REGLAN) 5 MG tablet Take 1 tablet (5 mg total) by mouth 3 (three) times daily before meals.  09/23/17  Yes Danis, Kirke Corin, MD  metoprolol succinate (TOPROL-XL) 100 MG 24 hr tablet Take 1 tablet (100 mg total) by mouth daily. Take with or immediately following a meal. 03/30/17  Yes Hongalgi, Lenis Dickinson, MD  omeprazole (PRILOSEC) 40 MG capsule TAKE 1 CAPSULE(40 MG) BY MOUTH DAILY Patient taking differently: Take 40 mg by mouth daily.  10/19/17  Yes Rutherford Guys, MD  ondansetron (ZOFRAN ODT) 8 MG disintegrating tablet Take 1 tablet (8 mg total) by mouth every 8 (eight) hours as needed for nausea or vomiting. 09/20/17  Yes Rutherford Guys, MD  timolol (BETIMOL) 0.5 % ophthalmic solution Place 1 drop into both eyes 2 (two) times daily. 05/07/17  Yes English, Colletta Maryland D, PA  travoprost, benzalkonium, (TRAVATAN) 0.004 % ophthalmic solution Place 1 drop into both eyes at bedtime.   Yes [provider]  AURYXIA 1 GM 210 MG(Fe) tablet Take 1 g by mouth See admin instructions. TAKE 3 TABLETS BY MOUTH THREE TIMES A DAY WITH MEALS AND 1 TABLET TWICE A DAY WITH SNACKS.  SWALLOW WHOLE, DO NOT CHEW OR CRUSH MEDICATION. 10/20/17   [provider]  blood glucose meter kit and supplies KIT Per insurance preference. Check glucose TID. E11.65, Z79.4 10/02/17   Rutherford Guys, MD  traMADol (ULTRAM) 50 MG tablet Take 1 tablet (50 mg total) by mouth every 12 (twelve) hours as needed. Patient not taking: Reported on 11/06/2017 09/23/17   Doran Stabler, MD    Family History Family History  Problem Relation Age of Onset  . Diabetes Mother   . Hypertension Father     Social History Social History   Tobacco Use  . Smoking status: Never Smoker  . Smokeless tobacco: Never Used  Substance Use Topics  . Alcohol use: No  . Drug use: No     Allergies   Patient has no known allergies.   Review of Systems Review of Systems  Constitutional: Positive for fever (subjective).  Eyes: Negative for visual disturbance.  Respiratory: Positive for cough (chronic). Negative for shortness of  breath.   Cardiovascular: Negative for chest pain and leg swelling.  Gastrointestinal: Positive for diarrhea, nausea and vomiting. Negative for abdominal pain and blood in stool.  Neurological: Positive for dizziness, weakness (generalized) and headaches. Negative for seizures, syncope and numbness.  Psychiatric/Behavioral: Positive for confusion.  All other systems reviewed and are negative.    Physical Exam Updated Vital Signs BP (!) 158/74 (BP Location: Right Arm)   Pulse 95   Temp 99.1 F (37.3 C) (Oral)   Resp 12   Ht 6' (1.829 m)   Wt 97.1 kg   SpO2 100%   BMI 29.02 kg/m   Physical Exam  Constitutional: He appears well-developed and well-nourished. No  distress.  HENT:  Head: Normocephalic and atraumatic.  Mouth/Throat: Mucous membranes are dry.  Eyes: Pupils are equal, round, and reactive to light. Conjunctivae and EOM are normal.  Neck: Neck supple.  Cardiovascular: Normal rate, regular rhythm, normal heart sounds and intact distal pulses.  Pulmonary/Chest: Effort normal and breath sounds normal. No respiratory distress.  No increased work of breathing noted.  Abdominal: Soft. There is no tenderness. There is no guarding.  Genitourinary: Rectal exam shows guaiac negative stool.  Genitourinary Comments: No external hemorrhoids, fissures, or lesions noted. No gross blood, melena, or stool burden. No rectal tenderness. No foreign bodies noted. RN, Lennette Bihari, served as chaperone during the rectal exam.  Musculoskeletal: He exhibits no edema.  Lymphadenopathy:    He has no cervical adenopathy.  Neurological: He is alert.  Patient is oriented to self only.  States the year is 82 and he is in a hospital in the [place] area. Sensation grossly intact to light touch in the extremities. Strength 5/5 in all extremities. Coordination intact. Cranial nerves III-XII grossly intact. No facial droop.   Skin: Skin is warm and dry. He is not diaphoretic.  Psychiatric: He has a normal mood and  affect. His behavior is normal.  Nursing note and vitals reviewed.    ED Treatments / Results  Labs (all labs ordered are listed, but only abnormal results are displayed) Labs Reviewed  BASIC METABOLIC PANEL - Abnormal; Notable for the following components:      Result Value   Chloride 95 (*)    BUN 32 (*)    Creatinine, Ser 11.46 (*)    GFR calc non Af Amer 5 (*)    GFR calc Af Amer 5 (*)    All other components within normal limits  CBC - Abnormal; Notable for the following components:   RBC 3.53 (*)    Hemoglobin 11.1 (*)    HCT 36.0 (*)    MCV 102.0 (*)    RDW 15.6 (*)    All other components within normal limits  HEPATIC FUNCTION PANEL - Abnormal; Notable for the following components:   Albumin 3.1 (*)    Total Bilirubin 1.7 (*)    Bilirubin, Direct 0.3 (*)    Indirect Bilirubin 1.4 (*)    All other components within normal limits  BRAIN NATRIURETIC PEPTIDE - Abnormal; Notable for the following components:   B Natriuretic Peptide 1,323.3 (*)    All other components within normal limits  TROPONIN I - Abnormal; Notable for the following components:   Troponin I 0.07 (*)    All other components within normal limits  CBG MONITORING, ED - Abnormal; Notable for the following components:   Glucose-Capillary 58 (*)    All other components within normal limits  I-STAT VENOUS BLOOD GAS, ED - Abnormal; Notable for the following components:   pCO2, Ven 60.9 (*)    pO2, Ven 25.0 (*)    Bicarbonate 34.9 (*)    TCO2 37 (*)    Acid-Base Excess 8.0 (*)    All other components within normal limits  CULTURE, BLOOD (ROUTINE X 2)  CULTURE, BLOOD (ROUTINE X 2)  LIPASE, BLOOD  DIFFERENTIAL  I-STAT CG4 LACTIC ACID, ED  POC OCCULT BLOOD, ED  I-STAT CG4 LACTIC ACID, ED  CBG MONITORING, ED   Troponin I  Date Value Ref Range Status  11/06/2017 0.07 (HH) <0.03 ng/mL Final    Comment:    CRITICAL RESULT CALLED TO, READ BACK BY AND VERIFIED WITH: A REABOLV  RN @ 1025 on 11/06/17 by  htemoche Performed at [facility], [place] [address], [place], [place] [address]   03/28/2017 0.12 (HH) <0.03 ng/mL Final    Comment:    CRITICAL VALUE NOTED.  VALUE IS CONSISTENT WITH PREVIOUSLY REPORTED AND CALLED VALUE.  03/28/2017 0.12 (HH) <0.03 ng/mL Final    Comment:    CRITICAL RESULT CALLED TO, READ BACK BY AND VERIFIED WITH: A.COGGINS,RN 03/28/17 _0  BY V.WILKINS   11/27/2016 0.06 (HH) <0.03 ng/mL Final    Comment:    CRITICAL VALUE NOTED.  VALUE IS CONSISTENT WITH PREVIOUSLY REPORTED AND CALLED VALUE.       EKG EKG Interpretation  Date/Time:  Sunday November 06 2017 07:44:43 EDT Ventricular Rate:  92 PR Interval:  200 QRS Duration: 114 QT Interval:  404 QTC Calculation: 499 R Axis:   -44 Text Interpretation:  Normal sinus rhythm Left axis deviation Incomplete left bundle branch block Minimal voltage criteria for LVH, may be normal variant Abnormal QRS-T angle, consider primary T wave abnormality Prolonged QT Abnormal ECG No significant change since last tracing Confirmed by Varney Biles [phone]) on 11/06/2017 9:27:55 AM Also confirmed by Varney Biles [phone]), editor Philomena Doheny [phone])  on 11/06/2017 10:22:02 AM   Radiology Dg Chest 2 View  Result Date: 11/06/2017 CLINICAL DATA:  Feeling sick with dizziness and vomiting. Altered mental status. Dialysis. EXAM: CHEST - 2 VIEW COMPARISON:  09/13/2017 and 03/28/2017 FINDINGS: Lungs are adequately inflated demonstrate mild chronic hazy prominence of the right perihilar markings. No lobar consolidation or effusion. Stable cardiomegaly. Left subclavian stents are present. Remainder of the exam is unchanged. IMPRESSION: Hypoinflation without acute cardiopulmonary disease. Stable prominence of the right perihilar markings. Stable cardiomegaly. Electronically Signed   By: Marin Olp M.D.   On: 11/06/2017 09:32   Ct Head Wo Contrast  Result Date: 11/06/2017 CLINICAL DATA:  Severe headache.  Worst headache of life  EXAM: CT HEAD WITHOUT CONTRAST TECHNIQUE: Contiguous axial images were obtained from the base of the skull through the vertex without intravenous contrast. COMPARISON:  None. FINDINGS: Brain: Mild to moderate atrophy.  Negative for hydrocephalus. Extensive chronic ischemic changes. Chronic microvascular ischemia in the white matter bilaterally. 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 Chronic infarct in the left occipital lobe. Extensive chronic infarct in the right superior cerebellum. Negative for acute infarct, hemorrhage, or mass. Benign-appearing calcifications in the basal ganglia and posterior fossa Vascular: Atherosclerotic calcification. Negative for hyperdense vessel Skull: Negative Sinuses/Orbits: Bilateral cataract surgery.  Paranasal sinuses clear Other: None IMPRESSION: Atrophy and extensive chronic ischemia.  No acute abnormality. Electronically Signed   By: Franchot Gallo M.D.   On: 11/06/2017 09:54   Ct Angio Chest Pe W And/or Wo Contrast  Result Date: 11/06/2017 CLINICAL DATA:  Shortness of breath. End-stage renal disease. Diabetes. CHF. EXAM: CT ANGIOGRAPHY CHEST WITH CONTRAST TECHNIQUE: Multidetector CT imaging of the chest was performed using the standard protocol during bolus administration of intravenous contrast. Multiplanar CT image reconstructions and MIPs were obtained to evaluate the vascular anatomy. CONTRAST:  154m ISOVUE-370 IOPAMIDOL (ISOVUE-370) INJECTION 76% COMPARISON:  09/06/2017 chest radiograph.  No prior CT. FINDINGS: Cardiovascular: The quality of this exam for evaluation of pulmonary embolism is moderate. Limitations include mild motion, EKG wires and leads. No evidence of pulmonary embolism. A left subclavian venous stent. Aortic and branch vessel atherosclerosis. Tortuous thoracic aorta. Moderate cardiomegaly, without pericardial effusion. Multivessel coronary artery atherosclerosis. Pulmonary artery enlargement, outflow tract 3.5 cm Mediastinum/Nodes: Left  supraclavicular node  measures 10 mm on image 11/5. No axillary adenopathy. Right paratracheal node measures 1.8 cm on 31/5. A node within the subcarinal station with extension into the azygoesophageal recess measures 3.0 x 2.5 cm on 47/5. Mild prevascular adenopathy. No hilar adenopathy. Lungs/Pleura: No pleural fluid. Extensive artifact from overlying EKG wires and leads. Diffuse air space and less so ground-glass opacity, slightly right lower lobe predominant. Upper Abdomen: Normal imaged portions of the liver, spleen, stomach, pancreas, biliary tract, right adrenal gland. Mild left adrenal nodularity, 1.4 cm. Similar to 11/18/2016. Renal atrophy. Advanced abdominal aortic and branch vessel atherosclerosis. Musculoskeletal: Right greater than left mild gynecomastia. Renal osteodystrophy. Review of the MIP images confirms the above findings. IMPRESSION: 1. Multifactorial degradation, including motion and support apparatus artifact. 2. No pulmonary embolism with above limitations. 3. Right greater than left and lower lobe predominant multifocal airspace and ground-glass opacity, most consistent with pneumonia. 4. Thoracic and supraclavicular adenopathy, most likely reactive. Given the extent of adenopathy, consider chest CT follow-up at 3-6 months. 5.  Aortic Atherosclerosis (ICD10-I70.0). 6. Pulmonary artery enlargement suggests pulmonary arterial hypertension. 7. Age advanced coronary artery atherosclerosis. Recommend assessment of coronary risk factors and consideration of medical therapy. 8. Left adrenal nodule which is technically indeterminate but given stability to 11/18/2016 is favored to be benign, most likely an adenoma. Electronically Signed   By: Abigail Miyamoto M.D.   On: 11/06/2017 13:44    Procedures .Critical Care Performed by: Lorayne Bender, PA-C Authorized by: Lorayne Bender, PA-C   Critical care provider statement:    Critical care time (minutes):  35   Critical care time was exclusive of:   Separately billable procedures and treating other patients   Critical care was necessary to treat or prevent imminent or life-threatening deterioration of the following conditions:  Respiratory failure   Critical care was time spent personally by me on the following activities:  Development of treatment plan with patient or surrogate, evaluation of patient's response to treatment, examination of patient, obtaining history from patient or surrogate, re-evaluation of patient's condition, pulse oximetry, ordering and review of radiographic studies, ordering and review of laboratory studies, ordering and performing treatments and interventions and review of old charts   I assumed direction of critical care for this patient from another provider in my specialty: no     (including critical care time)  Medications Ordered in ED Medications  ceFEPIme (MAXIPIME) 2 g in sodium chloride 0.9 % 100 mL IVPB (has no administration in time range)  vancomycin (VANCOCIN) 2,000 mg in sodium chloride 0.9 % 500 mL IVPB (has no administration in time range)  dextrose 50 % solution 50 mL (50 mLs Intravenous Given 11/06/17 0943)  iopamidol (ISOVUE-370) 76 % injection (100 mLs  Contrast Given 11/06/17 1238)     Initial Impression / Assessment and Plan / ED Course  I have reviewed the triage vital signs and the nursing notes.  Pertinent labs & imaging results that were available during my care of the patient were reviewed by me and considered in my medical decision making (see chart for details).  Clinical Course as of Nov 07 1427  Sun Nov 06, 2017  5361 Hemoglobin 11.8 on 05/24/17 in Care everywhere.  Hemoglobin(!): 11.1 [SJ]  1039 Last noted value was over 1800 May 2018.  B Natriuretic Peptide(!): 1,323.3 [SJ]  1207 Patient states he feels better. Now A&Ox4.   [SJ]  54 CT notes pneumonia, which was not originally high on differential.  He is afebrile, not  tachycardic, not hypotensive, and has no leukocytosis.    CT Angio Chest PE W and/or Wo Contrast [SJ]  9 Spoke with Dr. Herbert Moors, hospitalist. Agrees to admit the patient.   [SJ]    Clinical Course User Index [SJ] Fatmata Legere C, PA-C    Patient presents with altered mental status and hypoxia.  Suspect his altered mental status may have been due to the hypoxia.  Multifocal pneumonia noted on CT.  He will be admitted for further management due to his high risk medical history and his hypoxia.  Doubt sepsis at this time.   Findings and plan of care discussed with Varney Biles, MD. Dr. Kathrynn Humble personally evaluated and examined this patient.  Vitals:   11/06/17 0726 11/06/17 0728 11/06/17 0732 11/06/17 0930  BP: (!) 158/74     Pulse: 95   93  Resp: 12     Temp: 99.1 F (37.3 C)     TempSrc: Oral     SpO2: (!) 85%  100% 95%  Weight:  97.1 kg    Height:  6' (1.829 m)     Vitals:   11/06/17 1045 11/06/17 1100 11/06/17 1115 11/06/17 1145  BP: (!) 164/67 (!) 158/64 (!) 156/52 (!) 153/71  Pulse:  92 92 90  Resp:  14 16   Temp:      TempSrc:      SpO2:  95% 95% 92%  Weight:      Height:         Final Clinical Impressions(s) / ED Diagnoses   Final diagnoses:  Multifocal pneumonia  Acute respiratory failure with hypoxia [facility])    ED Discharge Orders    None       Layla Maw 11/06/17 [place]iffin, Ankit, MD 11/06/17 1630

## 2017-11-06 NOTE — Consult Note (Addendum)
[facility] Renal Consultation Note    Indication for Consultation:  Management of ESRD/hemodialysis; anemia, hypertension/volume and secondary hyperparathyroidism PCP:  HPI: Howard Bell is a 52 y.o. male with ESRD on hemodialysis since 2010 2/2 DM/HTN. He has hemodialysis MWF at [facility]. PMH: DM, HTN, diabetic gastroparesis, CHF, CVA, SHPT, AOCD. Last HD 11/04/2017, left at OP EDW 98 kg. Gains controlled past week but usually has high IDWG, poor binder compliance. He rarely misses HD.   Patient presented to ED with H/O chronic cough for 3-4 months with clear mucous and nausea and vomiting. Over past week, symptoms worsened. Apparently yesterday, he became more lethargic, was noted to have chills-temperature 99.1 at home. He was brought by wife to ED.  Upon arrival to ED, temperature was noted to be 99.1 BP 158/74 HR 94 RR 12. WBC 7.1 HGB 11.1 Lactic acid 1.22 Na 138 K+ 4.0 SCr 11.46 BUN 32 Ca 9.5 total bilirubin, indirect bili slightly elevated. Troponin 0.07. CXR shows Hypoinflation without acute cardiopulmonary disease. Stable prominence of the right perihilar markings. CTA negative for PE, R >L and lower lobe predominant multifocal airspace and ground glass opacity most consistent with PNA. He has been admitted by primary for PNA, he has been started on Vanc/Zosyn per primary. BC pending. BS was noted to be low in ED which improved with amp D50W.   Patient is very poor historian and wife is not with patient. HPI obtained largely from medical record.  He does says he has been feeling "bad" for several months with cough, nausea and vomiting. Currently he says he feels a little better. He has hacking cough with clear secretions. He coughs until he gags.  He is at baseline mentally but says he is confused because he has "lost Saturday"-doesn't recall events which brought him to hospital. He denies SOB, says he "knows he is not volume overloaded". Will have  HD off schedule today as patient has been losing weight and ground glass opacity could also be suggestive of pulmonary edema.   Past Medical History:  Diagnosis Date  . Congestive heart failure (CHF) ([place])   . 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 Diabetic gastroparesis ([place]) 11/06/2017  . ESRD (end stage renal disease) on dialysis ([place])   . Hypertension    Past Surgical History:  Procedure Laterality Date  . HERNIA REPAIR    . IR AV DIALY SHUNT INTRO NEEDLE/INTRACATH INITIAL W/PTA/IMG LEFT  03/30/2017   Family History  Problem Relation Age of Onset  . Diabetes Mother   . Hypertension Father    Social History:  reports that he has never smoked. He has never used smokeless tobacco. He reports that he does not drink alcohol or use drugs. No Known Allergies Prior to Admission medications   Medication Sig Start Date End Date Taking? Authorizing Provider  acetaminophen-codeine (TYLENOL #3) 300-30 MG tablet Take 1 tablet by mouth at bedtime. 11/01/17  Yes Danis, Kirke Corin, MD  amLODipine (NORVASC) 10 MG tablet Take 1 tablet (10 mg total) by mouth daily. 03/30/17  Yes Hongalgi, Lenis Dickinson, MD  brimonidine (ALPHAGAN) 0.2 % ophthalmic solution Place 1 drop into both eyes 2 (two) times daily.   Yes [provider]  insulin lispro (HUMALOG KWIKPEN) 100 UNIT/ML KiwkPen Inject 0-0.09 mLs (0-9 Units total) into the skin 3 (three) times daily with meals. CBG < 70: Eat or drink something sweet and recheck. CBG 70 - 120: 0 units CBG 121 -  150: 1 unit CBG 151 - 200: 2 units CBG 201 - 250: 3 units CBG 251 - 300: 5 units CBG 301 - 350: 7 units CBG 351 - 400: 9 units CBG > 400: call MD. Patient taking differently: Inject 0-9 Units into the skin 3 (three) times daily with meals. CBG 70 - 120: 0 units CBG 121 - 150: 1 unit CBG 151 - 200: 2 units CBG 201 - 250: 3 units CBG 251 - 300: 5 units CBG 301 - 350: 7 units CBG 351 - 400: 9 units CBG < 70: Eat or drink  something sweet and recheck. CBG > 400: call MD. 03/30/17  Yes Hongalgi, Lenis Dickinson, MD  metoCLOPramide (REGLAN) 5 MG tablet Take 1 tablet (5 mg total) by mouth 3 (three) times daily before meals. 09/23/17  Yes Danis, Kirke Corin, MD  metoprolol succinate (TOPROL-XL) 100 MG 24 hr tablet Take 1 tablet (100 mg total) by mouth daily. Take with or immediately following a meal. 03/30/17  Yes Hongalgi, Lenis Dickinson, MD  omeprazole (PRILOSEC) 40 MG capsule TAKE 1 CAPSULE(40 MG) BY MOUTH DAILY Patient taking differently: Take 40 mg by mouth daily.  10/19/17  Yes Rutherford Guys, MD  ondansetron (ZOFRAN ODT) 8 MG disintegrating tablet Take 1 tablet (8 mg total) by mouth every 8 (eight) hours as needed for nausea or vomiting. 09/20/17  Yes Rutherford Guys, MD  timolol (BETIMOL) 0.5 % ophthalmic solution Place 1 drop into both eyes 2 (two) times daily. 05/07/17  Yes English, Colletta [place] D, PA  travoprost, benzalkonium, (TRAVATAN) 0.004 % ophthalmic solution Place 1 drop into both eyes at bedtime.   Yes [provider]  AURYXIA 1 GM 210 MG(Fe) tablet Take 1 g by mouth See admin instructions. TAKE 3 TABLETS BY MOUTH THREE TIMES A DAY WITH MEALS AND 1 TABLET TWICE A DAY WITH SNACKS.  SWALLOW WHOLE, DO NOT CHEW OR CRUSH MEDICATION. 10/20/17   [provider]  blood glucose meter kit and supplies KIT Per insurance preference. Check glucose TID. E11.65, Z79.4 10/02/17   Rutherford Guys, MD  traMADol (ULTRAM) 50 MG tablet Take 1 tablet (50 mg total) by mouth every 12 (twelve) hours as needed. Patient not taking: Reported on 11/06/2017 09/23/17   Doran Stabler, MD   Current Facility-Administered Medications  Medication Dose Route Frequency Provider Last Rate Last Dose  . piperacillin-tazobactam (ZOSYN) IVPB 3.375 g  3.375 g Intravenous Q12H Reginia Naas, RPH      . vancomycin (VANCOCIN) 2,000 mg in sodium chloride 0.9 % 500 mL IVPB  2,000 mg Intravenous Once Reginia Naas, RPH 250 mL/hr at 11/06/17  1514 2,000 mg at 11/06/17 1514  . [START ON 11/07/2017] vancomycin (VANCOCIN) IVPB 1000 mg/200 mL premix  1,000 mg Intravenous Q M,W,F-HD Batchelder, Cecilio Asper, [facility]       Current Outpatient Medications  Medication Sig Dispense Refill  . acetaminophen-codeine (TYLENOL #3) 300-30 MG tablet Take 1 tablet by mouth at bedtime. 15 tablet 0  . amLODipine (NORVASC) 10 MG tablet Take 1 tablet (10 mg total) by mouth daily. 30 tablet 0  . brimonidine (ALPHAGAN) 0.2 % ophthalmic solution Place 1 drop into both eyes 2 (two) times daily.    . insulin lispro (HUMALOG KWIKPEN) 100 UNIT/ML KiwkPen Inject 0-0.09 mLs (0-9 Units total) into the skin 3 (three) times daily with meals. CBG < 70: Eat or drink something sweet and recheck. CBG 70 - 120: 0 units CBG 121 - 150: 1  unit CBG 151 - 200: 2 units CBG 201 - 250: 3 units CBG 251 - 300: 5 units CBG 301 - 350: 7 units CBG 351 - 400: 9 units CBG > 400: call MD. (Patient taking differently: Inject 0-9 Units into the skin 3 (three) times daily with meals. CBG 70 - 120: 0 units CBG 121 - 150: 1 unit CBG 151 - 200: 2 units CBG 201 - 250: 3 units CBG 251 - 300: 5 units CBG 301 - 350: 7 units CBG 351 - 400: 9 units CBG < 70: Eat or drink something sweet and recheck. CBG > 400: call MD.)    . metoCLOPramide (REGLAN) 5 MG tablet Take 1 tablet (5 mg total) by mouth 3 (three) times daily before meals. 90 tablet 0  . metoprolol succinate (TOPROL-XL) 100 MG 24 hr tablet Take 1 tablet (100 mg total) by mouth daily. Take with or immediately following a meal. 30 tablet 0  . omeprazole (PRILOSEC) 40 MG capsule TAKE 1 CAPSULE(40 MG) BY MOUTH DAILY (Patient taking differently: Take 40 mg by mouth daily. ) 30 capsule 0  . ondansetron (ZOFRAN ODT) 8 MG disintegrating tablet Take 1 tablet (8 mg total) by mouth every 8 (eight) hours as needed for nausea or vomiting. 10 tablet 0  . timolol (BETIMOL) 0.5 % ophthalmic solution Place 1 drop into both eyes 2 (two) times daily. 10 mL 1   . travoprost, benzalkonium, (TRAVATAN) 0.004 % ophthalmic solution Place 1 drop into both eyes at bedtime.    Lorin Picket 1 GM 210 MG(Fe) tablet Take 1 g by mouth See admin instructions. TAKE 3 TABLETS BY MOUTH THREE TIMES A DAY WITH MEALS AND 1 TABLET TWICE A DAY WITH SNACKS.  SWALLOW WHOLE, DO NOT CHEW OR CRUSH MEDICATION.  11  . blood glucose meter kit and supplies KIT Per insurance preference. Check glucose TID. E11.65, Z79.4 1 each 11  . traMADol (ULTRAM) 50 MG tablet Take 1 tablet (50 mg total) by mouth every 12 (twelve) hours as needed. (Patient not taking: Reported on 11/06/2017) 20 tablet 0   Labs: Basic Metabolic Panel: Recent Labs  Lab 11/06/17 0750  NA 138  K 4.0  CL 95*  CO2 28  GLUCOSE 70  BUN 32*  CREATININE 11.46*  CALCIUM 9.5   Liver Function Tests: Recent Labs  Lab 11/06/17 0750  AST 16  ALT 11  ALKPHOS 75  BILITOT 1.7*  PROT 7.4  ALBUMIN 3.1*   Recent Labs  Lab 11/06/17 0750  LIPASE 25   No results for input(s): AMMONIA in the last 168 hours. CBC: Recent Labs  Lab 11/06/17 0750  WBC 7.1  NEUTROABS 4.8  HGB 11.1*  HCT 36.0*  MCV 102.0*  PLT 162   Cardiac Enzymes: Recent Labs  Lab 11/06/17 0856  TROPONINI 0.07*   CBG: Recent Labs  Lab 11/06/17 0935 11/06/17 1127 11/06/17 1451  GLUCAP 58* 94 75   Iron Studies: No results for input(s): IRON, TIBC, TRANSFERRIN, FERRITIN in the last 72 hours. Studies/Results: Dg Chest 2 View  Result Date: 11/06/2017 CLINICAL DATA:  Feeling sick with dizziness and vomiting. Altered mental status. Dialysis. EXAM: CHEST - 2 VIEW COMPARISON:  09/13/2017 and 03/28/2017 FINDINGS: Lungs are adequately inflated demonstrate mild chronic hazy prominence of the right perihilar markings. No lobar consolidation or effusion. Stable cardiomegaly. Left subclavian stents are present. Remainder of the exam is unchanged. IMPRESSION: Hypoinflation without acute cardiopulmonary disease. Stable prominence of the right perihilar  markings. Stable cardiomegaly. Electronically  Signed   By: Marin Olp M.D.   On: 11/06/2017 09:32   Ct Head Wo Contrast  Result Date: 11/06/2017 CLINICAL DATA:  Severe headache.  Worst headache of life EXAM: CT HEAD WITHOUT CONTRAST TECHNIQUE: Contiguous axial images were obtained from the base of the skull through the vertex without intravenous contrast. COMPARISON:  None. FINDINGS: Brain: Mild to moderate atrophy.  Negative for hydrocephalus. Extensive chronic ischemic changes. Chronic microvascular ischemia in the white matter bilaterally. 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 Chronic infarct in the left occipital lobe. Extensive chronic infarct in the right superior cerebellum. Negative for acute infarct, hemorrhage, or mass. Benign-appearing calcifications in the basal ganglia and posterior fossa Vascular: Atherosclerotic calcification. Negative for hyperdense vessel Skull: Negative Sinuses/Orbits: Bilateral cataract surgery.  Paranasal sinuses clear Other: None IMPRESSION: Atrophy and extensive chronic ischemia.  No acute abnormality. Electronically Signed   By: Franchot Gallo M.D.   On: 11/06/2017 09:54   Ct Angio Chest Pe W And/or Wo Contrast  Result Date: 11/06/2017 CLINICAL DATA:  Shortness of breath. End-stage renal disease. Diabetes. CHF. EXAM: CT ANGIOGRAPHY CHEST WITH CONTRAST TECHNIQUE: Multidetector CT imaging of the chest was performed using the standard protocol during bolus administration of intravenous contrast. Multiplanar CT image reconstructions and MIPs were obtained to evaluate the vascular anatomy. CONTRAST:  16m ISOVUE-370 IOPAMIDOL (ISOVUE-370) INJECTION 76% COMPARISON:  09/06/2017 chest radiograph.  No prior CT. FINDINGS: Cardiovascular: The quality of this exam for evaluation of pulmonary embolism is moderate. Limitations include mild motion, EKG wires and leads. No evidence of pulmonary embolism. A left subclavian venous stent. Aortic and branch vessel  atherosclerosis. Tortuous thoracic aorta. Moderate cardiomegaly, without pericardial effusion. Multivessel coronary artery atherosclerosis. Pulmonary artery enlargement, outflow tract 3.5 cm Mediastinum/Nodes: Left supraclavicular node measures 10 mm on image 11/5. No axillary adenopathy. Right paratracheal node measures 1.8 cm on 31/5. A node within the subcarinal station with extension into the azygoesophageal recess measures 3.0 x 2.5 cm on 47/5. Mild prevascular adenopathy. No hilar adenopathy. Lungs/Pleura: No pleural fluid. Extensive artifact from overlying EKG wires and leads. Diffuse air space and less so ground-glass opacity, slightly right lower lobe predominant. Upper Abdomen: Normal imaged portions of the liver, spleen, stomach, pancreas, biliary tract, right adrenal gland. Mild left adrenal nodularity, 1.4 cm. Similar to 11/18/2016. Renal atrophy. Advanced abdominal aortic and branch vessel atherosclerosis. Musculoskeletal: Right greater than left mild gynecomastia. Renal osteodystrophy. Review of the MIP images confirms the above findings. IMPRESSION: 1. Multifactorial degradation, including motion and support apparatus artifact. 2. No pulmonary embolism with above limitations. 3. Right greater than left and lower lobe predominant multifocal airspace and ground-glass opacity, most consistent with pneumonia. 4. Thoracic and supraclavicular adenopathy, most likely reactive. Given the extent of adenopathy, consider chest CT follow-up at 3-6 months. 5.  Aortic Atherosclerosis (ICD10-I70.0). 6. Pulmonary artery enlargement suggests pulmonary arterial hypertension. 7. Age advanced coronary artery atherosclerosis. Recommend assessment of coronary risk factors and consideration of medical therapy. 8. Left adrenal nodule which is technically indeterminate but given stability to 11/18/2016 is favored to be benign, most likely an adenoma. Electronically Signed   By: KAbigail MiyamotoM.D.   On: 11/06/2017 13:44     ROS: As per HPI otherwise negative.  Endocrine No DM.  No Thyroid disease.  No Adrenal disease.  Physical Exam: Vitals:   11/06/17 1430 11/06/17 1445 11/06/17 1500 11/06/17 1515  BP:  (!) 150/79    Pulse: (!) 37  92 99  Resp:  Temp:      TempSrc:      SpO2: 95%  97% 96%  Weight:      Height:         General: Well developed, well nourished, in no acute distress. Head: Normocephalic, atraumatic, sclera non-icteric, mucus membranes are moist. Face is puffy with mild periorbital edema. Fundi ok Neck: Supple. JVD  elevated.PCL Lungs: Bilateral breath sounds slightly decreased in bases with few scattered rhonchi which clear with cough, few scattered bibasilar crackles.Rales bases up through mid lungs  Heart: RRR with S1 S2. 2/6 systolic M.  Abdomen: Soft, non-tender, non-distended with normoactive bowel sounds. No rebound/guarding. No obvious abdominal masses. M-S:  Strength and tone appear normal for age. Lower extremities:without edema or ischemic changes, no open wounds DP 1+ Neuro: Alert and oriented X 3. Moves all extremities spontaneously. Psych:  Responds to questions appropriately with a normal affect. Dialysis Access: LUA AVG +T/B  Dialysis Orders: [facility] MWF 4 hrs 15 min 180 NRe 450/Autoflow 2.0 98 kgs 2.0 K/ 2.5 Ca UFP 2 LUA AVG  -Heparin 9000 units IV TIW -Parsabiv 7.5 mg IV TIW (not on [facility] formulary) -Hectorol 7 mcg IV TIW (Last PTH 1222 10/14/17) -Venofer 50 mg IV weekly (new dose, has not been started yet. Recent Fe load-last Fe 62 Ferritin 971 Tsat 29% 10/14/2017)   Assessment/Plan: 1.  PNA: Per primary. Started on Vanc/Zosyn per primiary.  2.  Volume Overload-patient has not been eating well, recent lowering of EDW at OP center. HD today off schedule for volume removal. Currently 1.7 under OP EDW.  3.  ESRD -  MWF via AVG. HD today and again tomorrow to get back on schedule K+ 4.0-use 2.0 K bath, usual heparin.  4.  Hypertension/volume  - BP slightly  elevated. Resume OP meds per primary. Lower EDW on DC.  5.  Anemia  - HGB 11.1 No ESA needed. Follow trend.  6.  Metabolic bone disease - Continue binders, VDRA. Parsabiv not on hospital formulary.  7.  Nutrition - Renal/Carb mod diet. Renal vit, nepro 8.  DM-per primary   Jimmye Norman. Owens Shark, NP-C 11/06/2017, 3:38 PM  [employer] [phone] I have seen and examined this patient and agree with the plan of care seen, eval, examined, counseled patient,discussed with extender .  Jeneen Rinks Joshuwa Vecchio 11/06/2017, 5:08 PM

## 2017-11-06 NOTE — ED Notes (Signed)
Placed PT of 6litres of o2

## 2017-11-06 NOTE — ED Triage Notes (Signed)
Wife stated, He has been sick with dizziness, throwing up, and not rerally aware.  Pt alert but not oriented to place, DOB, or time. In dialysis no problem except throwing up during dialysis.

## 2017-11-06 NOTE — ED Notes (Signed)
Pt does not make urine for urinalysis. Pt is now alert and oriented to person, year. Disoriented to month.

## 2017-11-07 ENCOUNTER — Other Ambulatory Visit: Payer: Self-pay

## 2017-11-07 DIAGNOSIS — J189 Pneumonia, unspecified organism: Principal | ICD-10-CM

## 2017-11-07 LAB — RENAL FUNCTION PANEL
Albumin: 2.8 g/dL — ABNORMAL LOW (ref 3.5–5.0)
Anion gap: 12 (ref 5–15)
BUN: 28 mg/dL — ABNORMAL HIGH (ref 6–20)
CO2: 28 mmol/L (ref 22–32)
Calcium: 9.7 mg/dL (ref 8.9–10.3)
Chloride: 97 mmol/L — ABNORMAL LOW (ref 98–111)
Creatinine, Ser: 9.6 mg/dL — ABNORMAL HIGH (ref 0.61–1.24)
GFR calc Af Amer: 6 mL/min — ABNORMAL LOW (ref >60)
GFR calc non Af Amer: 6 mL/min — ABNORMAL LOW (ref >60)
Glucose, Bld: 138 mg/dL — ABNORMAL HIGH (ref 70–99)
Phosphorus: 6.6 mg/dL — ABNORMAL HIGH (ref 2.5–4.6)
Potassium: 4 mmol/L (ref 3.5–5.1)
Sodium: 137 mmol/L (ref 135–145)

## 2017-11-07 LAB — PROCALCITONIN
Procalcitonin: 2.04 ng/mL
Procalcitonin: 2 ng/mL

## 2017-11-07 LAB — CBC
HCT: 34.7 % — ABNORMAL LOW (ref 39.0–52.0)
Hemoglobin: 10.9 g/dL — ABNORMAL LOW (ref 13.0–17.0)
MCH: 31.5 pg (ref 26.0–34.0)
MCHC: 31.4 g/dL (ref 30.0–36.0)
MCV: 100.3 fL — ABNORMAL HIGH (ref 78.0–100.0)
Platelets: 162 10*3/uL (ref 150–400)
RBC: 3.46 MIL/uL — ABNORMAL LOW (ref 4.22–5.81)
RDW: 15.2 % (ref 11.5–15.5)
WBC: 6.2 10*3/uL (ref 4.0–10.5)

## 2017-11-07 LAB — GLUCOSE, CAPILLARY
Glucose-Capillary: 172 mg/dL — ABNORMAL HIGH (ref 70–99)
Glucose-Capillary: 88 mg/dL (ref 70–99)
Glucose-Capillary: 142 mg/dL — ABNORMAL HIGH (ref 70–99)

## 2017-11-07 LAB — HIV ANTIBODY (ROUTINE TESTING W REFLEX): HIV Screen 4th Generation wRfx: NONREACTIVE

## 2017-11-07 MED ORDER — HEPARIN SODIUM (PORCINE) 1000 UNIT/ML DIALYSIS
9000.0000 [IU] | Freq: Once | INTRAMUSCULAR | Status: DC
  Filled 2017-11-07: qty 9

## 2017-11-07 MED ORDER — LIDOCAINE HCL (PF) 1 % IJ SOLN: 5.0000 mL | INTRAMUSCULAR | Status: DC | PRN

## 2017-11-07 MED ORDER — SODIUM CHLORIDE 0.9 % IV SOLN: 100.0000 mL | INTRAVENOUS | Status: DC | PRN

## 2017-11-07 MED ORDER — LIDOCAINE-PRILOCAINE 2.5-2.5 % EX CREA: 1.0000 "application " | TOPICAL_CREAM | CUTANEOUS | Status: DC | PRN

## 2017-11-07 MED ORDER — HEPARIN SODIUM (PORCINE) 1000 UNIT/ML DIALYSIS
1000.0000 [IU] | INTRAMUSCULAR | Status: DC | PRN
  Filled 2017-11-07: qty 1

## 2017-11-07 MED ORDER — LIDOCAINE-PRILOCAINE 2.5-2.5 % EX CREA
1.0000 "application " | TOPICAL_CREAM | CUTANEOUS | Status: DC | PRN
  Filled 2017-11-07: qty 5

## 2017-11-07 MED ORDER — PNEUMOCOCCAL VAC POLYVALENT 25 MCG/0.5ML IJ INJ
0.5000 mL | INJECTION | INTRAMUSCULAR | Status: AC
  Administered 2017-11-08: 0.5 mL via INTRAMUSCULAR
  Filled 2017-11-07: qty 0.5

## 2017-11-07 MED ORDER — PENTAFLUOROPROP-TETRAFLUOROETH EX AERO: 1.0000 "application " | INHALATION_SPRAY | CUTANEOUS | Status: DC | PRN

## 2017-11-07 MED ORDER — ALTEPLASE 2 MG IJ SOLR: 2.0000 mg | Freq: Once | INTRAMUSCULAR | Status: DC | PRN

## 2017-11-07 NOTE — Progress Notes (Signed)
Howard Bell Progress Note  Subjective: on HD , no c/o. 4.5 L net removed yest on HD  Vitals:   11/07/17 0930 11/07/17 1000 11/07/17 1030 11/07/17 1038  BP: 109/61 (!) 125/51 (!) 85/49 126/74  Pulse: 78 86 85 91  Resp:      Temp:      TempSrc:      SpO2:      Weight:      Height:        Inpatient medications: . acetaminophen-codeine  1 tablet Oral QHS  . amLODipine  10 mg Oral Daily  . brimonidine  1 drop Both Eyes BID  . Chlorhexidine Gluconate Cloth  6 each Topical Q0600  . Chlorhexidine Gluconate Cloth  6 each Topical Q0600  . doxercalciferol  7 mcg Intravenous Q M,W,F-HD  . ferric citrate  1,050 mg Oral TID WC  . heparin  5,000 Units Subcutaneous Q8H  . heparin  9,000 Units Dialysis Once in dialysis  . heparin  9,000 Units Dialysis Once in dialysis  . insulin aspart  0-9 Units Subcutaneous TID WC  . metoCLOPramide  5 mg Oral TID AC  . metoprolol succinate  100 mg Oral Daily  . pantoprazole  40 mg Oral Daily  . sodium chloride flush  3 mL Intravenous Q12H  . sucroferric oxyhydroxide  1,500 mg Oral TID WC  . timolol  1 drop Both Eyes BID  . Travoprost (BAK Free)  1 drop Both Eyes QHS   . sodium chloride    . sodium chloride    . sodium chloride    . sodium chloride    . sodium chloride    . piperacillin-tazobactam (ZOSYN)  IV 3.375 g (11/06/17 2242)  . vancomycin     sodium chloride, sodium chloride, sodium chloride, sodium chloride, sodium chloride, acetaminophen **OR** acetaminophen, alteplase, heparin, lidocaine (PF), lidocaine-prilocaine, ondansetron **OR** ondansetron (ZOFRAN) IV, pentafluoroprop-tetrafluoroeth, polyethylene glycol, sodium chloride flush, sucroferric oxyhydroxide, traMADol  Iron/TIBC/Ferritin/ %Sat No results found for: IRON, TIBC, FERRITIN, IRONPCTSAT  Exam: No distress  no jvd  Chest cta bilat  Cor reg no mrg  Abd soft ntnd  Ext trace edema  NF, Ox3   LUA AVG+bruit  Dialysis: MWF SW  4h 50min   98kg  2/2.5 bath  LUA  AVG  Hep 9000 -Parsabiv 7.5 mg IV TIW (not on [facility] formulary) -Hectorol 7 mcg IV TIW (Last PTH 1222 10/14/17) -Venofer 50 mg IV weekly (new dose, has not been started yet. Recent Fe load-last Fe 62 Ferritin 971 Tsat 29% 10/14/2017)      Impression: 1  Vol overload/ pulm edema - pt has lost sig body wt causing fluid overload as this does in ESRD pt's. 6kg under prior dry wt before HD this am.  Plan HD today , max UF 2  HCAP - RLL on CT, on abx.  3  HTN - cont meds 4  DM per primary 5  Anemia - Hb good, no need for esa 6  Dispo - stable for dc from renal standpoint  Plan - as above   Kelly Splinter MD [place] pager [phone]   11/07/2017, 10:44 AM   Recent Labs  Lab 11/06/17 0750 11/07/17 0807  NA 138 137  K 4.0 4.0  CL 95* 97*  CO2 28 28  GLUCOSE 70 138*  BUN 32* 28*  CREATININE 11.46* 9.60*  CALCIUM 9.5 9.7  PHOS  --  6.6*  ALBUMIN 3.1* 2.8*   Recent Labs  Lab 11/06/17 0750  AST 16  ALT 11  ALKPHOS 75  BILITOT 1.7*  PROT 7.4   Recent Labs  Lab 11/06/17 0750 11/07/17 0808  WBC 7.1 6.2  NEUTROABS 4.8  --   HGB 11.1* 10.9*  HCT 36.0* 34.7*  MCV 102.0* 100.3*  PLT 162 162

## 2017-11-07 NOTE — Progress Notes (Signed)
SLP Cancellation Note  Patient Details Name: Nhia Heaphy MRN: [id number] DOB: 1965-10-17   Cancelled treatment:       Reason Eval/Treat Not Completed: Patient at procedure or test/unavailable   Germain Osgood 11/07/2017, 10:03 AM  Germain Osgood, M.A. CCC-SLP [phone]

## 2017-11-07 NOTE — Progress Notes (Signed)
PROGRESS NOTE    Howard Bell  VXB:[id number] DOB: Jul 25, 1965 DOA: 11/06/2017 PCP: Horald Pollen, MD   Brief Narrative: Patient is a 52 year old male with past medical history of type 2 diabetes, ESRD on Monday, Wednesday and Friday, hypertension, diabetic gastroparesis, constipation.  Presents to the emergency room with complaints of altered mental status, nausea, vomiting, shortness of breath.  Patient found to be in acute hypoxic respiratory failure.  Imagings were suggestive of multifocal pneumonia and he was admitted for further management.  Assessment & Plan:   Principal Problem:   Multifocal pneumonia Active Problems:   Acute respiratory failure with hypoxemia (HCC)   Diabetes mellitus with complication (HCC)   Elevated troponin   Essential hypertension   ESRD on dialysis (HCC)   Altered mental status   Diabetic gastroparesis (HCC)  Acute hypoxic respiratory failure: Could be secondary to multifocal pneumonia versus fluid overload.  Currently respiratory status stable.  Multifocal pneumonia: Chest imaging suggestive of this.  Showed multifocal pneumonia more on the right.  Currently he is hemo-dynamically stable.  Afebrile.  Does not complain of chest pain or shortness of breath or cough.  Continue  broad-spectrum antibiotics for now.  Will plan to taper the antibiotics as soon as possible.  We will follow-up cultures.  Patient has slightly elevated procalcitonin.  Speech also consulted for possible aspiration pneumonia.  Altered mental status: Resolved.  Most likely altered mental status on presentation was secondary to hypoglycemia in the setting of poor oral intake.  CT is negative.  No signs of stroke.  Diabetic gastroparesis: Has H/O chronic nausea and vomiting.  He is on metoclopramide at home.  Needs good glycemic control.  Will advise  him to take small volume, frequent meals.  Hypertension: Currently normotensive.  On amlodipine and metoprolol at home which  we will continue.  ESRD on dialysis: Nephrology following.  Underwent dialysis today.  Anemia: Most likely associated with CKD.  Continue iron supplementation.  DM type 2:Continue sliding scale insulin.    DVT prophylaxis: heparin  Code Status: Full Family Communication: None present at the bedside Disposition Plan: Likely home in 1 to 2 days   Consultants: Nephrology  Procedures: Dialysis  Antimicrobials: Vancomycin, cefepime day 2  Subjective: Patient seen and examined at bedside in the dialysis.  Remains very comfortable.  Hemodynamically stable.  Denies any chest pain, shortness of breath or cough.  No active issues/events.  Objective: Vitals:   11/07/17 1100 11/07/17 1130 11/07/17 1200 11/07/17 1318  BP: (!) 117/58 111/61 117/60 129/68  Pulse: 84 86 63 92  Resp:   18   Temp:   98.3 F (36.8 C) 98.5 F (36.9 C)  TempSrc:   Oral Oral  SpO2:   98% 100%  Weight:   90.5 kg   Height:        Intake/Output Summary (Last 24 hours) at 11/07/2017 1432 Last data filed at 11/07/2017 1200 Gross per 24 hour  Intake 348.88 ml  Output 6000 ml  Net -5651.12 ml   Filed Weights   11/06/17 2030 11/07/17 0700 11/07/17 1200  Weight: 91.8 kg 92 kg 90.5 kg    Examination:  General exam: Appears calm and comfortable ,Not in distress,average built HEENT:PERRL,Oral mucosa moist, Ear/Nose normal on gross exam Respiratory system: Crackles on the right base.   Cardiovascular system: S1 & S2 heard, RRR. No JVD, murmurs, rubs, gallops or clicks. No pedal edema. Gastrointestinal system: Abdomen is nondistended, soft and nontender. No organomegaly or masses felt. Normal bowel sounds heard.  Central nervous system: Alert and oriented. No focal neurological deficits. Extremities: No edema, no clubbing ,no cyanosis, distal peripheral pulses palpable.AV fistula on the left arm Skin: No rashes, lesions or ulcers,no icterus ,no pallor MSK: Normal muscle bulk,tone ,power Psychiatry:  Judgement and insight appear normal. Mood & affect appropriate.     Data Reviewed: I have personally reviewed following labs and imaging studies  CBC: Recent Labs  Lab 11/06/17 0750 11/07/17 0808  WBC 7.1 6.2  NEUTROABS 4.8  --   HGB 11.1* 10.9*  HCT 36.0* 34.7*  MCV 102.0* 100.3*  PLT 162 440   Basic Metabolic Panel: Recent Labs  Lab 11/06/17 0750 11/07/17 0807  NA 138 137  K 4.0 4.0  CL 95* 97*  CO2 28 28  GLUCOSE 70 138*  BUN 32* 28*  CREATININE 11.46* 9.60*  CALCIUM 9.5 9.7  PHOS  --  6.6*   GFR: Estimated Creatinine Clearance: 10.1 mL/min (A) (by C-G formula based on SCr of 9.6 mg/dL (H)). Liver Function Tests: Recent Labs  Lab 11/06/17 0750 11/07/17 0807  AST 16  --   ALT 11  --   ALKPHOS 75  --   BILITOT 1.7*  --   PROT 7.4  --   ALBUMIN 3.1* 2.8*   Recent Labs  Lab 11/06/17 0750  LIPASE 25   No results for input(s): AMMONIA in the last 168 hours. Coagulation Profile: No results for input(s): INR, PROTIME in the last 168 hours. Cardiac Enzymes: Recent Labs  Lab 11/06/17 0856  TROPONINI 0.07*   BNP (last 3 results) No results for input(s): PROBNP in the last 8760 hours. HbA1C: No results for input(s): HGBA1C in the last 72 hours. CBG: Recent Labs  Lab 11/06/17 0935 11/06/17 1127 11/06/17 1451 11/07/17 1314  GLUCAP 58* 94 75 172*   Lipid Profile: No results for input(s): CHOL, HDL, LDLCALC, TRIG, CHOLHDL, LDLDIRECT in the last 72 hours. Thyroid Function Tests: No results for input(s): TSH, T4TOTAL, FREET4, T3FREE, THYROIDAB in the last 72 hours. Anemia Panel: No results for input(s): VITAMINB12, FOLATE, FERRITIN, TIBC, IRON, RETICCTPCT in the last 72 hours. Sepsis Labs: Recent Labs  Lab 11/06/17 0751 11/06/17 0945 11/06/17 2301 11/07/17 0807  PROCALCITON  --   --  2.04 2.00  LATICACIDVEN 1.22 1.34  --   --     Recent Results (from the past 240 hour(s))  Culture, blood (routine x 2)     Status: None (Preliminary result)     Collection Time: 11/06/17  8:56 AM  Result Value Ref Range Status   Specimen Description BLOOD RIGHT ANTECUBITAL  Final   Special Requests   Final    BOTTLES DRAWN AEROBIC AND ANAEROBIC Blood Culture adequate volume   Culture   Final    NO GROWTH 1 DAY Performed at [facility], [place] [address], [place], [place] [address]    Report Status PENDING  Incomplete  Culture, blood (routine x 2)     Status: None (Preliminary result)   Collection Time: 11/06/17  9:39 AM  Result Value Ref Range Status   Specimen Description BLOOD RIGHT HAND  Final   Special Requests   Final    BOTTLES DRAWN AEROBIC AND ANAEROBIC Blood Culture results may not be optimal due to an inadequate volume of blood received in culture bottles   Culture   Final    NO GROWTH 1 DAY Performed at [facility], [place] [address], [place], [place] [address]    Report Status  PENDING  Incomplete         Radiology Studies: Dg Chest 2 View  Result Date: 11/06/2017 CLINICAL DATA:  Feeling sick with dizziness and vomiting. Altered mental status. Dialysis. EXAM: CHEST - 2 VIEW COMPARISON:  09/13/2017 and 03/28/2017 FINDINGS: Lungs are adequately inflated demonstrate mild chronic hazy prominence of the right perihilar markings. No lobar consolidation or effusion. Stable cardiomegaly. Left subclavian stents are present. Remainder of the exam is unchanged. IMPRESSION: Hypoinflation without acute cardiopulmonary disease. Stable prominence of the right perihilar markings. Stable cardiomegaly. Electronically Signed   By: Marin Olp M.D.   On: 11/06/2017 09:32   Ct Head Wo Contrast  Result Date: 11/06/2017 CLINICAL DATA:  Severe headache.  Worst headache of life EXAM: CT HEAD WITHOUT CONTRAST TECHNIQUE: Contiguous axial images were obtained from the base of the skull through the vertex without intravenous contrast. COMPARISON:  None. FINDINGS: Brain: Mild to moderate atrophy.  Negative for hydrocephalus. Extensive chronic  ischemic changes. Chronic microvascular ischemia in the white matter bilaterally. 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 Chronic infarct in the left occipital lobe. Extensive chronic infarct in the right superior cerebellum. Negative for acute infarct, hemorrhage, or mass. Benign-appearing calcifications in the basal ganglia and posterior fossa Vascular: Atherosclerotic calcification. Negative for hyperdense vessel Skull: Negative Sinuses/Orbits: Bilateral cataract surgery.  Paranasal sinuses clear Other: None IMPRESSION: Atrophy and extensive chronic ischemia.  No acute abnormality. Electronically Signed   By: Franchot Gallo M.D.   On: 11/06/2017 09:54   Ct Angio Chest Pe W And/or Wo Contrast  Result Date: 11/06/2017 CLINICAL DATA:  Shortness of breath. End-stage renal disease. Diabetes. CHF. EXAM: CT ANGIOGRAPHY CHEST WITH CONTRAST TECHNIQUE: Multidetector CT imaging of the chest was performed using the standard protocol during bolus administration of intravenous contrast. Multiplanar CT image reconstructions and MIPs were obtained to evaluate the vascular anatomy. CONTRAST:  151mL ISOVUE-370 IOPAMIDOL (ISOVUE-370) INJECTION 76% COMPARISON:  09/06/2017 chest radiograph.  No prior CT. FINDINGS: Cardiovascular: The quality of this exam for evaluation of pulmonary embolism is moderate. Limitations include mild motion, EKG wires and leads. No evidence of pulmonary embolism. A left subclavian venous stent. Aortic and branch vessel atherosclerosis. Tortuous thoracic aorta. Moderate cardiomegaly, without pericardial effusion. Multivessel coronary artery atherosclerosis. Pulmonary artery enlargement, outflow tract 3.5 cm Mediastinum/Nodes: Left supraclavicular node measures 10 mm on image 11/5. No axillary adenopathy. Right paratracheal node measures 1.8 cm on 31/5. A node within the subcarinal station with extension into the azygoesophageal recess measures 3.0 x 2.5 cm on 47/5. Mild prevascular  adenopathy. No hilar adenopathy. Lungs/Pleura: No pleural fluid. Extensive artifact from overlying EKG wires and leads. Diffuse air space and less so ground-glass opacity, slightly right lower lobe predominant. Upper Abdomen: Normal imaged portions of the liver, spleen, stomach, pancreas, biliary tract, right adrenal gland. Mild left adrenal nodularity, 1.4 cm. Similar to 11/18/2016. Renal atrophy. Advanced abdominal aortic and branch vessel atherosclerosis. Musculoskeletal: Right greater than left mild gynecomastia. Renal osteodystrophy. Review of the MIP images confirms the above findings. IMPRESSION: 1. Multifactorial degradation, including motion and support apparatus artifact. 2. No pulmonary embolism with above limitations. 3. Right greater than left and lower lobe predominant multifocal airspace and ground-glass opacity, most consistent with pneumonia. 4. Thoracic and supraclavicular adenopathy, most likely reactive. Given the extent of adenopathy, consider chest CT follow-up at 3-6 months. 5.  Aortic Atherosclerosis (ICD10-I70.0). 6. Pulmonary artery enlargement suggests pulmonary arterial hypertension. 7. Age advanced coronary artery atherosclerosis. Recommend assessment of coronary risk factors and consideration of  medical therapy. 8. Left adrenal nodule which is technically indeterminate but given stability to 11/18/2016 is favored to be benign, most likely an adenoma. Electronically Signed   By: Abigail Miyamoto M.D.   On: 11/06/2017 13:44        Scheduled Meds: . acetaminophen-codeine  1 tablet Oral QHS  . amLODipine  10 mg Oral Daily  . brimonidine  1 drop Both Eyes BID  . Chlorhexidine Gluconate Cloth  6 each Topical Q0600  . Chlorhexidine Gluconate Cloth  6 each Topical Q0600  . doxercalciferol  7 mcg Intravenous Q M,W,F-HD  . ferric citrate  1,050 mg Oral TID WC  . heparin  5,000 Units Subcutaneous Q8H  . insulin aspart  0-9 Units Subcutaneous TID WC  . metoCLOPramide  5 mg Oral TID AC    . metoprolol succinate  100 mg Oral Daily  . pantoprazole  40 mg Oral Daily  . sodium chloride flush  3 mL Intravenous Q12H  . sucroferric oxyhydroxide  1,500 mg Oral TID WC  . timolol  1 drop Both Eyes BID  . Travoprost (BAK Free)  1 drop Both Eyes QHS   Continuous Infusions: . sodium chloride    . piperacillin-tazobactam (ZOSYN)  IV 3.375 g (11/07/17 1417)  . vancomycin       LOS: 1 day    Time spent:25 mins. More than 50% of that time was spent in counseling and/or coordination of care.      Shelly Coss, MD [facility] Pager [phone]  If 7PM-7AM, please contact night-coverage [email or web address] Password [facility] 11/07/2017, 2:32 PM

## 2017-11-08 ENCOUNTER — Other Ambulatory Visit: Payer: Self-pay

## 2017-11-08 ENCOUNTER — Inpatient Hospital Stay (HOSPITAL_COMMUNITY): Payer: Medicare Other

## 2017-11-08 LAB — GLUCOSE, CAPILLARY
Glucose-Capillary: 149 mg/dL — ABNORMAL HIGH (ref 70–99)
Glucose-Capillary: 183 mg/dL — ABNORMAL HIGH (ref 70–99)
Glucose-Capillary: 141 mg/dL — ABNORMAL HIGH (ref 70–99)
Glucose-Capillary: 115 mg/dL — ABNORMAL HIGH (ref 70–99)

## 2017-11-08 LAB — BASIC METABOLIC PANEL
Anion gap: 10 (ref 5–15)
BUN: 20 mg/dL (ref 6–20)
CO2: 30 mmol/L (ref 22–32)
Calcium: 9.6 mg/dL (ref 8.9–10.3)
Chloride: 93 mmol/L — ABNORMAL LOW (ref 98–111)
Creatinine, Ser: 7.12 mg/dL — ABNORMAL HIGH (ref 0.61–1.24)
GFR calc Af Amer: 9 mL/min — ABNORMAL LOW (ref >60)
GFR calc non Af Amer: 8 mL/min — ABNORMAL LOW (ref >60)
Glucose, Bld: 153 mg/dL — ABNORMAL HIGH (ref 70–99)
Potassium: 3.6 mmol/L (ref 3.5–5.1)
Sodium: 133 mmol/L — ABNORMAL LOW (ref 135–145)

## 2017-11-08 LAB — PROCALCITONIN: Procalcitonin: 2 ng/mL

## 2017-11-08 MED ORDER — AMOXICILLIN-POT CLAVULANATE 875-125 MG PO TABS: 1.0000 | ORAL_TABLET | Freq: Two times a day (BID) | ORAL | 0 refills | Status: DC

## 2017-11-08 MED ORDER — AMLODIPINE BESYLATE 5 MG PO TABS
2.5000 mg | ORAL_TABLET | Freq: Two times a day (BID) | ORAL | Status: DC
  Filled 2017-11-08 (×2): qty 1

## 2017-11-08 MED ORDER — CHLORHEXIDINE GLUCONATE CLOTH 2 % EX PADS
6.0000 | MEDICATED_PAD | Freq: Every day | CUTANEOUS | Status: DC
  Administered 2017-11-10: 6 via TOPICAL

## 2017-11-08 NOTE — Care Management Note (Signed)
Case Management Note  Patient Details  Name: Calbert Hulsebus MRN: [id number] Date of Birth: 04-Feb-1966  Subjective/Objective:  mulitfocal PNA, acute resp failure, DM, ESRD on HD                   Action/Plan: Spoke to pt and lives at home with wife. Oxygen sats on RA 83%. Pt may need oxygen for home. Will have Unit RN repeat walking sat qualifications in am, 11/09/2017.   Expected Discharge Date:  11/08/17               Expected Discharge Plan:  Home/Self Care  In-House Referral:  NA  Discharge planning Services  CM Consult  Post Acute Care Choice:  NA Choice offered to:  NA  DME Arranged:  Oxygen DME Agency:     HH Arranged:    HH Agency:     Status of Service:  In process, will continue to follow  If discussed at Long Length of Stay Meetings, dates discussed:    Additional Comments:  Erenest Rasher, RN 11/08/2017, 4:55 PM

## 2017-11-08 NOTE — Progress Notes (Signed)
SATURATION QUALIFICATIONS: (This note is used to comply with regulatory documentation for home oxygen)  Patient Saturations on Room Air at Rest = 88%  Patient Saturations on Room Air while Ambulating = 83%  Patient Saturations on 2 Liters of oxygen while Ambulating = 92%  Please briefly explain why patient needs home oxygen:Pt oxygen saturation goes between 83-88% on RA.

## 2017-11-08 NOTE — Evaluation (Signed)
Clinical/Bedside Swallow Evaluation Patient Details  Name: Howard Bell MRN: [id number] Date of Birth: Nov 26, 1965  Today's Date: 11/08/2017 Time: SLP Start Time (ACUTE ONLY): 4174 SLP Stop Time (ACUTE ONLY): 1538 SLP Time Calculation (min) (ACUTE ONLY): 21 min  Past Medical History:  Past Medical History:  Diagnosis Date  . Congestive heart failure (CHF) ([place])   . 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 Diabetic gastroparesis ([place]) 11/06/2017  . ESRD (end stage renal disease) on dialysis ([place])   . Hypertension    Past Surgical History:  Past Surgical History:  Procedure Laterality Date  . HERNIA REPAIR    . IR AV DIALY SHUNT INTRO NEEDLE/INTRACATH INITIAL W/PTA/IMG LEFT  03/30/2017   HPI:  Howard Bell is a 52 y.o. male with medical history significant of type 2 diabetes on insulin, ESRD on Monday Wednesday Friday dialysis, hypertension, diabetic gastroparesis, unspecified congestive heart failure, who comes in with altered mental status, nausea, vomiting and found to have multifocal pneumonia with acute hypoxic respiratory failure. Per chart wife reports a chronic cough 3-4 months pending work up and repeated episodes of nausea and vomiting and pending EGD for this. Contrasted head CT showed extensive chronic ischemia but no acute process.  X-ray showed hypoinflation.  CTA PE protocol showed no evidence of pulmonary embolism but did show findings concerning for multifocal pneumonia. CXR 8/20 revealed CHF with mild interstitial edema. Slight interval improvement in the appearance of the pulmonary vascularity since the study of 2 days ago. One cannot exclude superimposed bibasilar pneumonia.   Assessment / Plan / Recommendation Clinical Impression  Pt presents with a suspected esophageal dyshagia with possible LPR (laryngopharyngeal reflux). Initial presentations of thin, puree and solid textures did not reveal cough, throat clear or wet vocal quality  however when head of bed reclined at end of evaluation he had hard, consistent cough. Pt's bed raised and he continued to cough and elicit multiple swallows indicative of possible reflux. Recommend he continue regular textures, thin liquids in upright postion, smaller more frequent meals and remain upright minimum of 45 minutes after meals. History eluded to plans for workup EGD and he would benefit as soon as able as he is at risk for post prandial aspiration.  SLP Visit Diagnosis: Dysphagia, unspecified (R13.10)(suspect esophageal)    Aspiration Risk  Moderate aspiration risk    Diet Recommendation Regular;Thin liquid   Liquid Administration via: Cup;Straw Medication Administration: Whole meds with liquid Supervision: Patient able to self feed Compensations: Slow rate;Small sips/bites Postural Changes: Remain upright for at least 30 minutes after po intake;Seated upright at 90 degrees    Other  Recommendations Oral Care Recommendations: Oral care BID   Follow up Recommendations Other (comment)(TBD)      Frequency and Duration min 1 x/week  2 weeks       Prognosis Prognosis for Safe Diet Advancement: Good      Swallow Study   General HPI: Howard Bell is a 52 y.o. male with medical history significant of type 2 diabetes on insulin, ESRD on Monday Wednesday Friday dialysis, hypertension, diabetic gastroparesis, unspecified congestive heart failure, who comes in with altered mental status, nausea, vomiting and found to have multifocal pneumonia with acute hypoxic respiratory failure. Per chart wife reports a chronic cough 3-4 months pending work up and repeated episodes of nausea and vomiting and pending EGD for this. Contrasted head CT showed extensive chronic ischemia but no acute process.  X-ray showed hypoinflation.  CTA PE protocol showed no evidence  of pulmonary embolism but did show findings concerning for multifocal pneumonia. CXR 8/20 revealed CHF with mild interstitial  edema. Slight interval improvement in the appearance of the pulmonary vascularity since the study of 2 days ago. One cannot exclude superimposed bibasilar pneumonia. Type of Study: Bedside Swallow Evaluation Previous Swallow Assessment: (none) Diet Prior to this Study: Regular;Thin liquids Temperature Spikes Noted: No Respiratory Status: Room air History of Recent Intubation: No Behavior/Cognition: Cooperative;Other (Comment);Requires cueing;Lethargic/Drowsy(flat affect) Oral Cavity Assessment: Within Functional Limits Oral Care Completed by SLP: No Oral Cavity - Dentition: Adequate natural dentition Vision: Functional for self-feeding Self-Feeding Abilities: Able to feed self Patient Positioning: Upright in bed Baseline Vocal Quality: Low vocal intensity Volitional Cough: Weak    Oral/Motor/Sensory Function Overall Oral Motor/Sensory Function: Other (comment)(mild right labial weakness- premorbid)   Ice Chips Ice chips: Not tested   Thin Liquid Thin Liquid: Within functional limits Other Comments: (see impressions)    Nectar Thick Nectar Thick Liquid: Not tested   Honey Thick Honey Thick Liquid: Not tested   Puree Puree: Within functional limits Presentation: (see impressions)   Solid     Solid: Within functional limits      Houston Siren 11/08/2017,3:49 PM  Orbie Pyo Ray[employer] [phone]

## 2017-11-08 NOTE — Care Management Important Message (Signed)
Important Message  Patient Details  Name: Howard Bell MRN: [id number] Date of Birth: [birth year]   Medicare Important Message Given:  Yes    Erenest Rasher, RN 11/08/2017, 12:36 PM

## 2017-11-08 NOTE — Progress Notes (Signed)
PROGRESS NOTE    Howard Bell  YOV:[id number] DOB: 06/19/1965 DOA: 11/06/2017 PCP: Horald Pollen, MD   Brief Narrative: Patient is a 52 year old male with past medical history of type 2 diabetes, ESRD on Monday, Wednesday and Friday, hypertension, diabetic gastroparesis, constipation.  Presents to the emergency room with complaints of altered mental status, nausea, vomiting, shortness of breath.  Patient found to be in acute hypoxic respiratory failure.  Imagings were suggestive of multifocal pneumonia and he was admitted for further management. Patient was started on IV antibiotics.  His respiratory status has improved.  He is being hemodialyzed during this hospitalization.  Assessment & Plan:   Principal Problem:   Multifocal pneumonia Active Problems:   Acute respiratory failure with hypoxemia (HCC)   Diabetes mellitus with complication (HCC)   Elevated troponin   Essential hypertension   ESRD on dialysis (HCC)   Altered mental status   Diabetic gastroparesis (HCC)  Acute hypoxic respiratory failure: Could be secondary to multifocal pneumonia versus fluid overload.  Found to be hypoxic on room air today.  Multifocal pneumonia: Chest imaging suggestive of this.  Showed multifocal pneumonia more on the right.  Currently he is hemo-dynamically stable.  Afebrile.  Does not complain of chest pain or cough.  Continue  broad-spectrum antibiotics for now.  Will plan to taper the antibiotics as soon as possible.  We will follow-up cultures.  Patient has slightly elevated procalcitonin.  Speech also consulted for possible aspiration pneumonia. Chest x-ray done today showed possible fluid overload versus pneumonia.  Desaturated on room air.  We will continue to monitor.  Altered mental status: Resolved.  Most likely altered mental status on presentation was secondary to hypoglycemia in the setting of poor oral intake.  CT is negative.  No signs of stroke.  Diabetic gastroparesis:  Has H/O chronic nausea and vomiting.  He is on metoclopramide at home.  Needs good glycemic control.  Will advise  him to take small volume, frequent meals.  Hypertension: Currently normotensive.  On amlodipine and metoprolol at home which we will continue.  ESRD on dialysis: Nephrology following.  Underwent dialysis today.  Anemia: Most likely associated with CKD.  Continue iron supplementation.  DM type 2:Continue sliding scale insulin.    DVT prophylaxis: heparin [place] Code Status: Full Family Communication: None present at the bedside Disposition Plan: Likely home in 1 to 2 days   Consultants: Nephrology  Procedures: Dialysis  Antimicrobials: Vancomycin, cefepime day 3  Subjective: Patient seen and examined the bedside this morning.  He is not in any kind of respiratory distress.  We are planning for discharge but he desaturated on room air.  He might need another session of dialysis tomorrow morning.  Objective: Vitals:   11/08/17 0731 11/08/17 1302 11/08/17 1310 11/08/17 1330  BP: (!) 120/59 129/74    Pulse: 72 71    Resp: 20     Temp: 98.6 F (37 C)     TempSrc: Oral     SpO2: 93% (!) 86% (!) 83% (!) 84%  Weight:      Height:        Intake/Output Summary (Last 24 hours) at 11/08/2017 1613 Last data filed at 11/08/2017 1300 Gross per 24 hour  Intake 340 ml  Output -  Net 340 ml   Filed Weights   11/06/17 2030 11/07/17 0700 11/07/17 1200  Weight: 91.8 kg 92 kg 90.5 kg    Examination:  General exam: Appears calm and comfortable ,Not in distress,average built HEENT:PERRL,Oral mucosa  moist, Ear/Nose normal on gross exam Respiratory system: Decreased air entry in the bases..   Cardiovascular system: S1 & S2 heard, RRR. No JVD, murmurs, rubs, gallops or clicks. No pedal edema. Gastrointestinal system: Abdomen is nondistended, soft and nontender. No organomegaly or masses felt. Normal bowel sounds heard. Central nervous system: Alert and oriented. No focal  neurological deficits. 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 Mood & affect appropriate.     Data Reviewed: I have personally reviewed following labs and imaging studies  CBC: Recent Labs  Lab 11/06/17 0750 11/07/17 0808  WBC 7.1 6.2  NEUTROABS 4.8  --   HGB 11.1* 10.9*  HCT 36.0* 34.7*  MCV 102.0* 100.3*  PLT 162 517   Basic Metabolic Panel: Recent Labs  Lab 11/06/17 0750 11/07/17 0807 11/08/17 0244  NA 138 137 133*  K 4.0 4.0 3.6  CL 95* 97* 93*  CO2 28 28 30   GLUCOSE 70 138* 153*  BUN 32* 28* 20  CREATININE 11.46* 9.60* 7.12*  CALCIUM 9.5 9.7 9.6  PHOS  --  6.6*  --    GFR: Estimated Creatinine Clearance: 13.6 mL/min (A) (by C-G formula based on SCr of 7.12 mg/dL (H)). Liver Function Tests: Recent Labs  Lab 11/06/17 0750 11/07/17 0807  AST 16  --   ALT 11  --   ALKPHOS 75  --   BILITOT 1.7*  --   PROT 7.4  --   ALBUMIN 3.1* 2.8*   Recent Labs  Lab 11/06/17 0750  LIPASE 25   No results for input(s): AMMONIA in the last 168 hours. Coagulation Profile: No results for input(s): INR, PROTIME in the last 168 hours. Cardiac Enzymes: Recent Labs  Lab 11/06/17 0856  TROPONINI 0.07*   BNP (last 3 results) No results for input(s): PROBNP in the last 8760 hours. HbA1C: No results for input(s): HGBA1C in the last 72 hours. CBG: Recent Labs  Lab 11/07/17 1314 11/07/17 1758 11/07/17 2142 11/08/17 0727 11/08/17 1140  GLUCAP 172* 88 142* 149* 183*   Lipid Profile: No results for input(s): CHOL, HDL, LDLCALC, TRIG, CHOLHDL, LDLDIRECT in the last 72 hours. Thyroid Function Tests: No results for input(s): TSH, T4TOTAL, FREET4, T3FREE, THYROIDAB in the last 72 hours. Anemia Panel: No results for input(s): VITAMINB12, FOLATE, FERRITIN, TIBC,  IRON, RETICCTPCT in the last 72 hours. Sepsis Labs: Recent Labs  Lab 11/06/17 0751 11/06/17 0945 11/06/17 2301 11/07/17 0807 11/08/17 0244  PROCALCITON  --   --  2.04 2.00 2.00  LATICACIDVEN 1.22 1.34  --   --   --     Recent Results (from the past 240 hour(s))  Culture, blood (routine x 2)     Status: None (Preliminary result)   Collection Time: 11/06/17  8:56 AM  Result Value Ref Range Status   Specimen Description BLOOD RIGHT ANTECUBITAL  Final   Special Requests   Final    BOTTLES DRAWN AEROBIC AND ANAEROBIC Blood Culture adequate volume   Culture   Final    NO GROWTH 2 DAYS Performed at [facility], [place] [address], [place], [place] [address]    Report Status PENDING  Incomplete  Culture, blood (routine x 2)     Status: None (Preliminary result)   Collection Time: 11/06/17  9:39 AM  Result Value Ref Range Status  Specimen Description BLOOD RIGHT HAND  Final   Special Requests   Final    BOTTLES DRAWN AEROBIC AND ANAEROBIC Blood Culture results may not be optimal due to an inadequate volume of blood received in culture bottles   Culture   Final    NO GROWTH 2 DAYS Performed at [facility], [place] [address], [place], [place] [address]    Report Status PENDING  Incomplete         Radiology Studies: Dg Chest 2 View  Result Date: 11/08/2017 CLINICAL DATA:  Shortness of breath and mildly productive cough for the past 2 months. History of diabetes, end-stage renal disease, CHF. EXAM: CHEST - 2 VIEW COMPARISON:  Chest x-ray of November 06, 2017 FINDINGS: The lungs are borderline hypoinflated especially on the right. The interstitial markings are increased also greatest on the right. Bibasilar densities persist. The cardiac silhouette remains enlarged. The pulmonary vascularity is slightly less engorged today. There is no pleural effusion. IMPRESSION: CHF with mild interstitial edema. Slight interval improvement in the appearance of the pulmonary vascularity since  the study of 2 days ago. One cannot exclude superimposed bibasilar pneumonia in the appropriate clinical setting. Electronically Signed   By: David  Martinique M.D.   On: 11/08/2017 09:40        Scheduled Meds: . acetaminophen-codeine  1 tablet Oral QHS  . amLODipine  2.5 mg Oral BID  . brimonidine  1 drop Both Eyes BID  . Chlorhexidine Gluconate Cloth  6 each Topical Q0600  . Chlorhexidine Gluconate Cloth  6 each Topical Q0600  . [START ON 11/09/2017] Chlorhexidine Gluconate Cloth  6 each Topical Q0600  . doxercalciferol  7 mcg Intravenous Q M,W,F-HD  . ferric citrate  1,050 mg Oral TID WC  . heparin  5,000 Units Subcutaneous Q8H  . insulin aspart  0-9 Units Subcutaneous TID WC  . metoCLOPramide  5 mg Oral TID AC  . pantoprazole  40 mg Oral Daily  . sodium chloride flush  3 mL Intravenous Q12H  . sucroferric oxyhydroxide  1,500 mg Oral TID WC  . timolol  1 drop Both Eyes BID  . Travoprost (BAK Free)  1 drop Both Eyes QHS   Continuous Infusions: . sodium chloride    . piperacillin-tazobactam (ZOSYN)  IV 3.375 g (11/08/17 0848)  . vancomycin       LOS: 2 days    Time spent:25 mins. More than 50% of that time was spent in counseling and/or coordination of care.      Shelly Coss, MD [facility] Pager [phone]  If 7PM-7AM, please contact night-coverage [email or web address] Password [facility] 11/08/2017, 4:13 PM

## 2017-11-08 NOTE — Discharge Summary (Addendum)
Physician Discharge Summary  Howard Bell TWK:[id number] DOB: 1965/09/16 DOA: 11/06/2017  PCP: Horald Pollen, MD  Admit date: 11/06/2017 Discharge date: 11/11/17 Admitted From: Home Disposition:  Home  Discharge Condition:Stable CODE STATUS:FULL Diet recommendation:Renal  Brief/Interim Summary:  Patient is a 52 year old male with past medical history of type 2 diabetes, ESRD on Monday, Wednesday and Friday, hypertension, diabetic gastroparesis, constipation.  Presents to the emergency room with complaints of altered mental status, nausea, vomiting, shortness of breath.  Patient found to be in acute hypoxic respiratory failure.  Imagings were suggestive of multifocal pneumonia and he was admitted for further management. His hospital course was remarkable for recurrent episodes of hypoxia on room air.  He was started on broad-spectrum antibiotics.   Cultures have been sent and are negative so far.  He underwent dialysis during this admission.  He actually qualify for home oxygen.  His hypoxia was attributed secondary to pneumonia and volume overload.  Patient is hemodynamically stable.  He is stable for discharge home with oral antibiotics.  Following problems were addressed during his hospitalization:  Acute hypoxic respiratory failure: Could be combination of  multifocal pneumonia versus and  fluid overload.  Found to be hypoxic on room air.  CT chest showed patchy infiltrates bilaterally mainly on the right lower lobe.  No new focal infiltrate. Patient might have chronic respiratory insufficiency secondary to pulmonary edema due to fluid overload due to ESRD.Qualified for home oxygen.  Multifocal pneumonia: Chest imaging suggestive of this.  Showed multifocal pneumonia more on the right.  Currently he is hemo-dynamically stable.  Afebrile.  Does not complain of chest pain or cough.  Continue  broad-spectrum antibiotics for now.  Will plan to taper the antibiotics as soon as  possible.  We will follow-up cultures.  Patient has slightly elevated procalcitonin.  Speech also consulted for possible aspiration pneumonia.Speech recommended follow-up with gastroenterology for possible EGD to evaluate for underlying pathology regarding reflux.  Patient has appointment with gastroenterology set up on September 16.  Diabetic gastroparesis: Has H/O chronic nausea and vomiting.  He is on metoclopramide at home.  Needs good glycemic control.  Will advise  him to take small volume, frequent meals.Patient has appointment with gastroenterology set up on September 16.  Hypertension: Currently normotensive.  On amlodipine and metoprolol at home which we will continue.  ESRD on dialysis: Nephrology following.  Dialysis tomorrow  Anemia: Most likely associated with CKD.  Continue iron supplementation.  DM type 2:Continue sliding scale insulin.  Weakness/unsteadiness: PT/OT eval the patient recommended skilled nursing facility on discharge.  Confusion/history of stroke: Patient is intermittently confused but was alert and oriented this morning.  He might have early onset dementia.  CT head on admission showed atrophy and extensive chronic ischemia.    Discharge Diagnoses:  Principal Problem:   Multifocal pneumonia Active Problems:   Acute respiratory failure with hypoxemia (HCC)   Diabetes mellitus with complication (HCC)   Elevated troponin   Essential hypertension   ESRD on dialysis ([facility])   Altered mental status   Diabetic gastroparesis [facility])    Discharge Instructions  Discharge Instructions    Diet - low sodium heart healthy   Complete by:  As directed    Discharge instructions   Complete by:  As directed    1)Take prescribed medications as instructed. 2)Follow up with your PCP in a week.   Increase activity slowly   Complete by:  As directed      Allergies as of 11/08/2017   No  Known Allergies     Medication List    TAKE these medications    acetaminophen-codeine 300-30 MG tablet Commonly known as:  TYLENOL #3 Take 1 tablet by mouth at bedtime.   amLODipine 10 MG tablet Commonly known as:  NORVASC Take 1 tablet (10 mg total) by mouth daily.   amoxicillin-clavulanate 875-125 MG tablet Commonly known as:  AUGMENTIN Take 1 tablet by mouth 2 (two) times daily for 5 days.   AURYXIA 1 GM 210 MG(Fe) tablet Generic drug:  ferric citrate Take 1 g by mouth See admin instructions. TAKE 3 TABLETS BY MOUTH THREE TIMES A DAY WITH MEALS AND 1 TABLET TWICE A DAY WITH SNACKS.  SWALLOW WHOLE, DO NOT CHEW OR CRUSH MEDICATION.   blood glucose meter kit and supplies Kit Per insurance preference. Check glucose TID. E11.65, Z79.4   brimonidine 0.2 % ophthalmic solution Commonly known as:  ALPHAGAN Place 1 drop into both eyes 2 (two) times daily.   insulin lispro 100 UNIT/ML KiwkPen Commonly known as:  HUMALOG Inject 0-0.09 mLs (0-9 Units total) into the skin 3 (three) times daily with meals. CBG < 70: Eat or drink something sweet and recheck. CBG 70 - 120: 0 units CBG 121 - 150: 1 unit CBG 151 - 200: 2 units CBG 201 - 250: 3 units CBG 251 - 300: 5 units CBG 301 - 350: 7 units CBG 351 - 400: 9 units CBG > 400: call MD. What changed:  additional instructions   metoCLOPramide 5 MG tablet Commonly known as:  REGLAN Take 1 tablet (5 mg total) by mouth 3 (three) times daily before meals.   metoprolol succinate 100 MG 24 hr tablet Commonly known as:  TOPROL-XL Take 1 tablet (100 mg total) by mouth daily. Take with or immediately following a meal.   omeprazole 40 MG capsule Commonly known as:  PRILOSEC TAKE 1 CAPSULE(40 MG) BY MOUTH DAILY What changed:  See the new instructions.   ondansetron 8 MG disintegrating tablet Commonly known as:  ZOFRAN-ODT Take 1 tablet (8 mg total) by mouth every 8 (eight) hours as needed for nausea or vomiting.   timolol 0.5 % ophthalmic solution Commonly known as:  BETIMOL Place 1 drop into both  eyes 2 (two) times daily.   traMADol 50 MG tablet Commonly known as:  ULTRAM Take 1 tablet (50 mg total) by mouth every 12 (twelve) hours as needed.   travoprost (benzalkonium) 0.004 % ophthalmic solution Commonly known as:  TRAVATAN Place 1 drop into both eyes at bedtime.      Follow-up Information    Horald Pollen, MD. Schedule an appointment as soon as possible for a visit in 1 week(s).   Specialty:  Internal Medicine Contact information: [place] [phone]          No Known Allergies  Consultations: Nephrology  Procedures/Studies: Dg Chest 2 View  Result Date: 11/08/2017 CLINICAL DATA:  Shortness of breath and mildly productive cough for the past 2 months. History of diabetes, end-stage renal disease, CHF. EXAM: CHEST - 2 VIEW COMPARISON:  Chest x-ray of November 06, 2017 FINDINGS: The lungs are borderline hypoinflated especially on the right. The interstitial markings are increased also greatest on the right. Bibasilar densities persist. The cardiac silhouette remains enlarged. The pulmonary vascularity is slightly less engorged today. There is no pleural effusion. IMPRESSION: CHF with mild interstitial edema. Slight interval improvement in the appearance of the pulmonary vascularity since the study of 2 days ago. One  cannot exclude superimposed bibasilar pneumonia in the appropriate clinical setting. Electronically Signed   By: David  Martinique M.D.   On: 11/08/2017 09:40   Dg Chest 2 View  Result Date: 11/06/2017 CLINICAL DATA:  Feeling sick with dizziness and vomiting. Altered mental status. Dialysis. EXAM: CHEST - 2 VIEW COMPARISON:  09/13/2017 and 03/28/2017 FINDINGS: Lungs are adequately inflated demonstrate mild chronic hazy prominence of the right perihilar markings. No lobar consolidation or effusion. Stable cardiomegaly. Left subclavian stents are present. Remainder of the exam is unchanged. IMPRESSION: Hypoinflation without acute  cardiopulmonary disease. Stable prominence of the right perihilar markings. Stable cardiomegaly. Electronically Signed   By: Marin Olp M.D.   On: 11/06/2017 09:32   Ct Head Wo Contrast  Result Date: 11/06/2017 CLINICAL DATA:  Severe headache.  Worst headache of life EXAM: CT HEAD WITHOUT CONTRAST TECHNIQUE: Contiguous axial images were obtained from the base of the skull through the vertex without intravenous contrast. COMPARISON:  None. FINDINGS: Brain: Mild to moderate atrophy.  Negative for hydrocephalus. Extensive chronic ischemic changes. Chronic microvascular ischemia in the white matter bilaterally. 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 Chronic infarct in the left occipital lobe. Extensive chronic infarct in the right superior cerebellum. Negative for acute infarct, hemorrhage, or mass. Benign-appearing calcifications in the basal ganglia and posterior fossa Vascular: Atherosclerotic calcification. Negative for hyperdense vessel Skull: Negative Sinuses/Orbits: Bilateral cataract surgery.  Paranasal sinuses clear Other: None IMPRESSION: Atrophy and extensive chronic ischemia.  No acute abnormality. Electronically Signed   By: Franchot Gallo M.D.   On: 11/06/2017 09:54   Ct Angio Chest Pe W And/or Wo Contrast  Result Date: 11/06/2017 CLINICAL DATA:  Shortness of breath. End-stage renal disease. Diabetes. CHF. EXAM: CT ANGIOGRAPHY CHEST WITH CONTRAST TECHNIQUE: Multidetector CT imaging of the chest was performed using the standard protocol during bolus administration of intravenous contrast. Multiplanar CT image reconstructions and MIPs were obtained to evaluate the vascular anatomy. CONTRAST:  129m ISOVUE-370 IOPAMIDOL (ISOVUE-370) INJECTION 76% COMPARISON:  09/06/2017 chest radiograph.  No prior CT. FINDINGS: Cardiovascular: The quality of this exam for evaluation of pulmonary embolism is moderate. Limitations include mild motion, EKG wires and leads. No evidence of pulmonary  embolism. A left subclavian venous stent. Aortic and branch vessel atherosclerosis. Tortuous thoracic aorta. Moderate cardiomegaly, without pericardial effusion. Multivessel coronary artery atherosclerosis. Pulmonary artery enlargement, outflow tract 3.5 cm Mediastinum/Nodes: Left supraclavicular node measures 10 mm on image 11/5. No axillary adenopathy. Right paratracheal node measures 1.8 cm on 31/5. A node within the subcarinal station with extension into the azygoesophageal recess measures 3.0 x 2.5 cm on 47/5. Mild prevascular adenopathy. No hilar adenopathy. Lungs/Pleura: No pleural fluid. Extensive artifact from overlying EKG wires and leads. Diffuse air space and less so ground-glass opacity, slightly right lower lobe predominant. Upper Abdomen: Normal imaged portions of the liver, spleen, stomach, pancreas, biliary tract, right adrenal gland. Mild left adrenal nodularity, 1.4 cm. Similar to 11/18/2016. Renal atrophy. Advanced abdominal aortic and branch vessel atherosclerosis. Musculoskeletal: Right greater than left mild gynecomastia. Renal osteodystrophy. Review of the MIP images confirms the above findings. IMPRESSION: 1. Multifactorial degradation, including motion and support apparatus artifact. 2. No pulmonary embolism with above limitations. 3. Right greater than left and lower lobe predominant multifocal airspace and ground-glass opacity, most consistent with pneumonia. 4. Thoracic and supraclavicular adenopathy, most likely reactive. Given the extent of adenopathy, consider chest CT follow-up at 3-6 months. 5.  Aortic Atherosclerosis (ICD10-I70.0). 6. Pulmonary artery enlargement suggests pulmonary arterial hypertension. 7. Age  advanced coronary artery atherosclerosis. Recommend assessment of coronary risk factors and consideration of medical therapy. 8. Left adrenal nodule which is technically indeterminate but given stability to 11/18/2016 is favored to be benign, most likely an adenoma.  Electronically Signed   By: Abigail Miyamoto M.D.   On: 11/06/2017 13:44       Subjective: Patient seen and examined bedside this morning.  Remains comfortable.  No new issues/events.  Stable for discharge.  Respiratory status much better today.  Discharge Exam: Vitals:   11/07/17 1318 11/08/17 0731  BP: 129/68 (!) 120/59  Pulse: 92 72  Resp:  20  Temp: 98.5 F (36.9 C) 98.6 F (37 C)  SpO2: 100% 93%   Vitals:   11/07/17 1130 11/07/17 1200 11/07/17 1318 11/08/17 0731  BP: 111/61 117/60 129/68 (!) 120/59  Pulse: 86 63 92 72  Resp:  18  20  Temp:  98.3 F (36.8 C) 98.5 F (36.9 C) 98.6 F (37 C)  TempSrc:  Oral Oral Oral  SpO2:  98% 100% 93%  Weight:  90.5 kg    Height:        General: Pt is alert, awake, not in acute distress Cardiovascular: RRR, S1/S2 +, no rubs, no gallops Respiratory: Bilateral decreased air entry on the bases Abdominal: Soft, NT, ND, bowel sounds + Extremities: no edema, no cyanosis    The results of significant diagnostics from this hospitalization (including imaging, microbiology, ancillary and laboratory) are listed below for reference.     Microbiology: Recent Results (from the past 240 hour(s))  Culture, blood (routine x 2)     Status: None (Preliminary result)   Collection Time: 11/06/17  8:56 AM  Result Value Ref Range Status   Specimen Description BLOOD RIGHT ANTECUBITAL  Final   Special Requests   Final    BOTTLES DRAWN AEROBIC AND ANAEROBIC Blood Culture adequate volume   Culture   Final    NO GROWTH 1 DAY Performed at [facility], [address], [place], [place] [address]    Report Status PENDING  Incomplete  Culture, blood (routine x 2)     Status: None (Preliminary result)   Collection Time: 11/06/17  9:39 AM  Result Value Ref Range Status   Specimen Description BLOOD RIGHT HAND  Final   Special Requests   Final    BOTTLES DRAWN AEROBIC AND ANAEROBIC Blood Culture results may not be optimal due to an inadequate  volume of blood received in culture bottles   Culture   Final    NO GROWTH 1 DAY Performed at [facility], [place] [address] [place], [place] [address]    Report Status PENDING  Incomplete     Labs: BNP (last 3 results) Recent Labs    05/07/17 0854 11/06/17 0841  BNP 97.5 6,387.5*   Basic Metabolic Panel: Recent Labs  Lab 11/06/17 0750 11/07/17 0807 11/08/17 0244  NA 138 137 133*  K 4.0 4.0 3.6  CL 95* 97* 93*  CO2 28 28 30   GLUCOSE 70 138* 153*  BUN 32* 28* 20  CREATININE 11.46* 9.60* 7.12*  CALCIUM 9.5 9.7 9.6  PHOS  --  6.6*  --    Liver Function Tests: Recent Labs  Lab 11/06/17 0750 11/07/17 0807  AST 16  --   ALT 11  --   ALKPHOS 75  --   BILITOT 1.7*  --   PROT 7.4  --   ALBUMIN 3.1* 2.8*   Recent Labs  Lab  11/06/17 0750  LIPASE 25   No results for input(s): AMMONIA in the last 168 hours. CBC: Recent Labs  Lab 11/06/17 0750 11/07/17 0808  WBC 7.1 6.2  NEUTROABS 4.8  --   HGB 11.1* 10.9*  HCT 36.0* 34.7*  MCV 102.0* 100.3*  PLT 162 162   Cardiac Enzymes: Recent Labs  Lab 11/06/17 0856  TROPONINI 0.07*   BNP: Invalid input(s): POCBNP CBG: Recent Labs  Lab 11/06/17 1451 11/07/17 1314 11/07/17 1758 11/07/17 2142 11/08/17 0727  GLUCAP 75 172* 88 142* 149*   D-Dimer No results for input(s): DDIMER in the last 72 hours. Hgb A1c No results for input(s): HGBA1C in the last 72 hours. Lipid Profile No results for input(s): CHOL, HDL, LDLCALC, TRIG, CHOLHDL, LDLDIRECT in the last 72 hours. Thyroid function studies No results for input(s): TSH, T4TOTAL, T3FREE, THYROIDAB in the last 72 hours.  Invalid input(s): FREET3 Anemia work up No results for input(s): VITAMINB12, FOLATE, FERRITIN, TIBC, IRON, RETICCTPCT in the last 72 hours. Urinalysis    Component Value Date/Time   COLORURINE RED (A) 11/19/2016 0020   APPEARANCEUR TURBID (A) 11/19/2016 0020   LABSPEC  11/19/2016 0020    TEST NOT REPORTED DUE TO COLOR INTERFERENCE  OF URINE PIGMENT   PHURINE  11/19/2016 0020    TEST NOT REPORTED DUE TO COLOR INTERFERENCE OF URINE PIGMENT   GLUCOSEU (A) 11/19/2016 0020    TEST NOT REPORTED DUE TO COLOR INTERFERENCE OF URINE PIGMENT   HGBUR (A) 11/19/2016 0020    TEST NOT REPORTED DUE TO COLOR INTERFERENCE OF URINE PIGMENT   BILIRUBINUR (A) 11/19/2016 0020    TEST NOT REPORTED DUE TO COLOR INTERFERENCE OF URINE PIGMENT   KETONESUR (A) 11/19/2016 0020    TEST NOT REPORTED DUE TO COLOR INTERFERENCE OF URINE PIGMENT   PROTEINUR (A) 11/19/2016 0020    TEST NOT REPORTED DUE TO COLOR INTERFERENCE OF URINE PIGMENT   NITRITE (A) 11/19/2016 0020    TEST NOT REPORTED DUE TO COLOR INTERFERENCE OF URINE PIGMENT   LEUKOCYTESUR (A) 11/19/2016 0020    TEST NOT REPORTED DUE TO COLOR INTERFERENCE OF URINE PIGMENT   Sepsis Labs Invalid input(s): PROCALCITONIN,  WBC,  LACTICIDVEN Microbiology Recent Results (from the past 240 hour(s))  Culture, blood (routine x 2)     Status: None (Preliminary result)   Collection Time: 11/06/17  8:56 AM  Result Value Ref Range Status   Specimen Description BLOOD RIGHT ANTECUBITAL  Final   Special Requests   Final    BOTTLES DRAWN AEROBIC AND ANAEROBIC Blood Culture adequate volume   Culture   Final    NO GROWTH 1 DAY Performed at [facility], [address] [place], [place] [address]    Report Status PENDING  Incomplete  Culture, blood (routine x 2)     Status: None (Preliminary result)   Collection Time: 11/06/17  9:39 AM  Result Value Ref Range Status   Specimen Description BLOOD RIGHT HAND  Final   Special Requests   Final    BOTTLES DRAWN AEROBIC AND ANAEROBIC Blood Culture results may not be optimal due to an inadequate volume of blood received in culture bottles   Culture   Final    NO GROWTH 1 DAY Performed at [facility], [place] [address], [place], [place] [address]    Report Status PENDING  Incomplete    Please note: You were cared for by a hospitalist during  your hospital stay. Once you are discharged,  your primary care physician will handle any further medical issues. Please note that NO REFILLS for any discharge medications will be authorized once you are discharged, as it is imperative that you return to your primary care physician (or establish a relationship with a primary care physician if you do not have one) for your post hospital discharge needs so that they can reassess your need for medications and monitor your lab values.    Time coordinating discharge: 40 minutes  SIGNED:   Shelly Coss, MD  [facility] 11/08/2017, 11:33 AM Pager [phone]  If 7PM-7AM, please contact night-coverage [email or web address] Password [email or web address]

## 2017-11-08 NOTE — Progress Notes (Addendum)
[facility] Progress Note  Subjective: feeling much better. States he has been trying to lose body wt.  No hypoxia on room air now.    Vitals:   11/07/17 1130 11/07/17 1200 11/07/17 1318 11/08/17 0731  BP: 111/61 117/60 129/68 (!) 120/59  Pulse: 86 63 92 72  Resp:  18  20  Temp:  98.3 F (36.8 C) 98.5 F (36.9 C) 98.6 F (37 C)  TempSrc:  Oral Oral Oral  SpO2:  98% 100% 93%  Weight:  90.5 kg    Height:        Inpatient medications: . acetaminophen-codeine  1 tablet Oral QHS  . amLODipine  10 mg Oral Daily  . brimonidine  1 drop Both Eyes BID  . Chlorhexidine Gluconate Cloth  6 each Topical Q0600  . Chlorhexidine Gluconate Cloth  6 each Topical Q0600  . doxercalciferol  7 mcg Intravenous Q M,W,F-HD  . ferric citrate  1,050 mg Oral TID WC  . heparin  5,000 Units Subcutaneous Q8H  . insulin aspart  0-9 Units Subcutaneous TID WC  . metoCLOPramide  5 mg Oral TID AC  . metoprolol succinate  100 mg Oral Daily  . pantoprazole  40 mg Oral Daily  . sodium chloride flush  3 mL Intravenous Q12H  . sucroferric oxyhydroxide  1,500 mg Oral TID WC  . timolol  1 drop Both Eyes BID  . Travoprost (BAK Free)  1 drop Both Eyes QHS   . sodium chloride    . piperacillin-tazobactam (ZOSYN)  IV 3.375 g (11/08/17 0848)  . vancomycin     sodium chloride, acetaminophen **OR** acetaminophen, ondansetron **OR** ondansetron (ZOFRAN) IV, polyethylene glycol, sodium chloride flush, sucroferric oxyhydroxide, traMADol  Iron/TIBC/Ferritin/ %Sat No results found for: IRON, TIBC, FERRITIN, IRONPCTSAT  Exam: No distress  no jvd  Chest cta bilat  Cor reg no mrg  Abd soft ntnd  Ext trace edema  NF, Ox3   LUA AVG+bruit  Dialysis: MWF SW  4h 70min   98kg  2/2.5 bath  LUA AVG  Hep 9000 -Parsabiv 7.5 mg IV TIW (not on [facility] formulary) -Hectorol 7 mcg IV TIW (Last PTH 1222 10/14/17) -Venofer 50 mg IV weekly (new dose, has not been started yet. Recent Fe load-last Fe 62 Ferritin 971 Tsat  29% 10/14/2017)      Impression: 1  Vol overload/ pulm edema - pt has lost sig body wt causing fluid overload as this does in ESRD pt's. He has been trying to lose weight. I told him to tell ALL his providers at the HD unit that he is trying to lose weight, that way they can better care for him. He is now well below prior edw , today is 90.5kg (edw was 98kg).  OK for dc.  Will lower dry wt to 90.5kg.  He should go to his OP HD tomorrow on schedule.   2  HCAP - RLL on CT, changing to po abx at dc  3  HTN - cont meds 4  DM per primary 5  Anemia - Hb good, no need for esa  Plan - as above   Kelly Splinter MD [place] pager [phone]   11/08/2017, 1:02 PM   Recent Labs  Lab 11/06/17 0750 11/07/17 0807 11/08/17 0244  NA 138 137 133*  K 4.0 4.0 3.6  CL 95* 97* 93*  CO2 28 28 30   GLUCOSE 70 138* 153*  BUN 32* 28* 20  CREATININE 11.46* 9.60* 7.12*  CALCIUM 9.5  9.7 9.6  PHOS  --  6.6*  --   ALBUMIN 3.1* 2.8*  --    Recent Labs  Lab 11/06/17 0750  AST 16  ALT 11  ALKPHOS 75  BILITOT 1.7*  PROT 7.4   Recent Labs  Lab 11/06/17 0750 11/07/17 0808  WBC 7.1 6.2  NEUTROABS 4.8  --   HGB 11.1* 10.9*  HCT 36.0* 34.7*  MCV 102.0* 100.3*  PLT 162 162

## 2017-11-09 ENCOUNTER — Inpatient Hospital Stay (HOSPITAL_COMMUNITY): Payer: Medicare Other

## 2017-11-09 LAB — RENAL FUNCTION PANEL
Albumin: 2.7 g/dL — ABNORMAL LOW (ref 3.5–5.0)
Anion gap: 12 (ref 5–15)
BUN: 31 mg/dL — ABNORMAL HIGH (ref 6–20)
CO2: 27 mmol/L (ref 22–32)
Calcium: 10 mg/dL (ref 8.9–10.3)
Chloride: 94 mmol/L — ABNORMAL LOW (ref 98–111)
Creatinine, Ser: 10.35 mg/dL — ABNORMAL HIGH (ref 0.61–1.24)
GFR calc Af Amer: 6 mL/min — ABNORMAL LOW (ref >60)
GFR calc non Af Amer: 5 mL/min — ABNORMAL LOW (ref >60)
Glucose, Bld: 143 mg/dL — ABNORMAL HIGH (ref 70–99)
Phosphorus: 4.7 mg/dL — ABNORMAL HIGH (ref 2.5–4.6)
Potassium: 3.9 mmol/L (ref 3.5–5.1)
Sodium: 133 mmol/L — ABNORMAL LOW (ref 135–145)

## 2017-11-09 LAB — CBC
HCT: 35.4 % — ABNORMAL LOW (ref 39.0–52.0)
Hemoglobin: 11.3 g/dL — ABNORMAL LOW (ref 13.0–17.0)
MCH: 31.2 pg (ref 26.0–34.0)
MCHC: 31.9 g/dL (ref 30.0–36.0)
MCV: 97.8 fL (ref 78.0–100.0)
Platelets: 191 10*3/uL (ref 150–400)
RBC: 3.62 MIL/uL — ABNORMAL LOW (ref 4.22–5.81)
RDW: 14.4 % (ref 11.5–15.5)
WBC: 6.3 10*3/uL (ref 4.0–10.5)

## 2017-11-09 LAB — GLUCOSE, CAPILLARY
Glucose-Capillary: 125 mg/dL — ABNORMAL HIGH (ref 70–99)
Glucose-Capillary: 157 mg/dL — ABNORMAL HIGH (ref 70–99)
Glucose-Capillary: 141 mg/dL — ABNORMAL HIGH (ref 70–99)

## 2017-11-09 MED ORDER — HEPARIN SODIUM (PORCINE) 1000 UNIT/ML DIALYSIS: 9000.0000 [IU] | Freq: Once | INTRAMUSCULAR | Status: DC

## 2017-11-09 MED ORDER — CHLORHEXIDINE GLUCONATE CLOTH 2 % EX PADS
6.0000 | MEDICATED_PAD | Freq: Every day | CUTANEOUS | Status: DC
  Administered 2017-11-11: 6 via TOPICAL

## 2017-11-09 MED ORDER — DOXERCALCIFEROL 4 MCG/2ML IV SOLN
INTRAVENOUS | Status: AC
  Administered 2017-11-09: 7 ug via INTRAVENOUS
  Filled 2017-11-09: qty 4

## 2017-11-09 MED ORDER — VANCOMYCIN HCL IN DEXTROSE 1-5 GM/200ML-% IV SOLN
INTRAVENOUS | Status: AC
  Administered 2017-11-09: 1000 mg via INTRAVENOUS
  Filled 2017-11-09: qty 200

## 2017-11-09 NOTE — Progress Notes (Addendum)
[facility] Progress Note  Subjective: O2 sats dropped yest on room air, dc plans cancelled, pt on HD now, plan 3-4 L UF and recheck O2 sats.  CXR yest showed improved but not resolved pulm edema.  Vitals:   11/09/17 1045 11/09/17 1100 11/09/17 1130 11/09/17 1145  BP: (!) 92/58 (!) 144/88 103/60 118/83  Pulse: 70 72 72 77  Resp:      Temp:      TempSrc:      SpO2:      Weight:      Height:        Inpatient medications: . acetaminophen-codeine  1 tablet Oral QHS  . amLODipine  2.5 mg Oral BID  . brimonidine  1 drop Both Eyes BID  . Chlorhexidine Gluconate Cloth  6 each Topical Q0600  . Chlorhexidine Gluconate Cloth  6 each Topical Q0600  . Chlorhexidine Gluconate Cloth  6 each Topical Q0600  . doxercalciferol      . doxercalciferol  7 mcg Intravenous Q M,W,F-HD  . ferric citrate  1,050 mg Oral TID WC  . heparin  5,000 Units Subcutaneous Q8H  . [START ON 11/10/2017] heparin  9,000 Units Dialysis Once in dialysis  . insulin aspart  0-9 Units Subcutaneous TID WC  . metoCLOPramide  5 mg Oral TID AC  . pantoprazole  40 mg Oral Daily  . sodium chloride flush  3 mL Intravenous Q12H  . sucroferric oxyhydroxide  1,500 mg Oral TID WC  . timolol  1 drop Both Eyes BID  . Travoprost (BAK Free)  1 drop Both Eyes QHS   . sodium chloride    . piperacillin-tazobactam (ZOSYN)  IV 3.375 g (11/09/17 0819)  . vancomycin    . vancomycin     sodium chloride, acetaminophen **OR** acetaminophen, ondansetron **OR** ondansetron (ZOFRAN) IV, polyethylene glycol, sodium chloride flush, sucroferric oxyhydroxide, traMADol  Iron/TIBC/Ferritin/ %Sat No results found for: IRON, TIBC, FERRITIN, IRONPCTSAT  Exam: No distress  no jvd  Chest cta bilat  Cor reg no mrg  Abd soft ntnd  Ext trace edema  NF, Ox3   LUA AVG+bruit  Dialysis: MWF SW  4h 75min   98kg  2/2.5 bath  LUA AVG  Hep 9000 -Parsabiv 7.5 mg IV TIW (not on [facility] formulary) -Hectorol 7 mcg IV TIW (Last PTH 1222  10/14/17) -Venofer 50 mg IV weekly (new dose, has not been started yet. Recent Fe load-last Fe 62 Ferritin 971 Tsat 29% 10/14/2017)      Impression: 1  Vol overload/ pulm edema - pt has lost sig body wt, trying to lose weight.  Still hypoxic on room air and likely cause is excess lung water.  Cont to lower dry wt and volume w/ HD today, max UF 3-4 L as tolerated.  If stable on room air post HD can be dc'd home today. If not, we will plan another HD tomorrow am.  2  HCAP - RLL on CT, changing to po abx at dc  3  HTN - will dc norvasc, home metoprolol also on hold here.   4  DM per primary 5  Anemia - Hb good, no need for esa  Plan - as above   Kelly Splinter MD [place] pager [phone]   11/09/2017, 12:03 PM   Recent Labs  Lab 11/07/17 0807 11/08/17 0244 11/09/17 0924  NA 137 133* 133*  K 4.0 3.6 3.9  CL 97* 93* 94*  CO2 28 30 27   GLUCOSE 138* 153* 143*  BUN 28* 20 31*  CREATININE 9.60* 7.12* 10.35*  CALCIUM 9.7 9.6 10.0  PHOS 6.6*  --  4.7*  ALBUMIN 2.8*  --  2.7*   Recent Labs  Lab 11/06/17 0750  AST 16  ALT 11  ALKPHOS 75  BILITOT 1.7*  PROT 7.4   Recent Labs  Lab 11/06/17 0750 11/07/17 0808 11/09/17 0923  WBC 7.1 6.2 6.3  NEUTROABS 4.8  --   --   HGB 11.1* 10.9* 11.3*  HCT 36.0* 34.7* 35.4*  MCV 102.0* 100.3* 97.8  PLT 162 162 191

## 2017-11-09 NOTE — Progress Notes (Signed)
11/09/17 1546  PT Visit Information  Last PT Received On 11/09/17  Assistance Needed +2 (for safety and chair follow )  PT/OT/SLP Co-Evaluation/Treatment Yes  Reason for Co-Treatment Necessary to address cognition/behavior during functional activity;To address functional/ADL transfers  PT goals addressed during session Balance;Mobility/safety with mobility  History of Present Illness 52 y.o. male with medical history significant of type 2 diabetes on insulin, ESRD on MWF dialysis, hypertension, diabetic gastroparesis, unspecified congestive heart failure, who comes in with altered mental status, nausea, vomiting and found to have multifocal pneumonia with acute hypoxic respiratory failure.   Precautions  Precautions Fall  Restrictions  Weight Bearing Restrictions No  Home Living  Family/patient expects to be discharged to: Private residence  Living Arrangements Spouse/significant other;Children  Available Help at Discharge Family;Available 24 hours/day  Type of Home Apartment  Home Access Stairs to enter  Entrance Stairs-Number of Steps 14  Entrance Stairs-Rails Right  Home Layout One level  Bathroom Therapist, music None  Prior Function  Level of Independence Independent  Comments reports he was driving, reports someone else was driving him to HD   Communication  Communication No difficulties  Pain Assessment  Pain Assessment No/denies pain  Cognition  Arousal/Alertness Awake/alert  Behavior During Therapy [facility] for tasks assessed/performed;Agitated;Flat affect  Overall Cognitive Status No family/caregiver present to determine baseline cognitive functioning  Area of Impairment Problem solving;Safety/judgement;Memory;Awareness;Following commands  Memory Decreased short-term memory  Following Commands Follows one step commands with increased time  Safety/Judgement Decreased awareness of safety;Decreased awareness of deficits   Awareness Emergent  Problem Solving Slow processing;Requires verbal cues;Requires tactile cues;Decreased initiation;Difficulty sequencing  General Comments pt with flat affect, not making eye contact with therapist when asking pt questions and appearing very disengaged, minimally participative during session. Pt reports he already walked this AM, in speaking with RN he did so yesterday; requires max multimodal cues to understand/sequence instructions during stand pivot transfer   Upper Extremity Assessment  Upper Extremity Assessment Defer to OT evaluation  Lower Extremity Assessment  Lower Extremity Assessment Generalized weakness  Cervical / Trunk Assessment  Cervical / Trunk Assessment Normal  Bed Mobility  Overal bed mobility Needs Assistance  Bed Mobility Supine to Sit  Supine to sit Min guard  General bed mobility comments for safety, no physical assist required  Transfers  Overall transfer level Needs assistance  Equipment used None  Transfers Sit to/from Stand;Stand Pivot Transfers  Sit to Stand Min guard;Min assist;+2 safety/equipment  Stand pivot transfers Min guard;Min assist;+2 physical assistance;+2 safety/equipment  General transfer comment pt adamant to perform transfer without assist, though very unsteady with attempts to take steps towards recliner, use of minA+1-2 to ensure safe transfer completion with cues for sequencing and safety   Ambulation/Gait  General Gait Details Pt refusing   Balance  Overall balance assessment Needs assistance  Sitting-balance support Feet supported  Sitting balance-Leahy Scale Good  Standing balance support Single extremity supported;No upper extremity supported  Standing balance-Leahy Scale Poor  Standing balance comment Reliant on external support   General Comments  General comments (skin integrity, edema, etc.) no family present during session.   PT - End of Session  Equipment Utilized During Treatment Oxygen  Activity Tolerance  Patient limited by fatigue  Patient left in chair;with call bell/phone within reach;with chair alarm set  Nurse Communication Mobility status;Other (comment) (pt wanting food )  PT Assessment  PT Recommendation/Assessment Patient needs continued PT services  PT Visit Diagnosis Unsteadiness  on feet (R26.81);Muscle weakness (generalized) (M62.81);Difficulty in walking, not elsewhere classified (R26.2)  PT Problem List Decreased strength;Decreased balance;Decreased mobility;Decreased activity tolerance;Decreased cognition;Decreased knowledge of use of DME;Decreased safety awareness;Decreased knowledge of precautions  PT Plan  PT Frequency (ACUTE ONLY) Min 2X/week  PT Treatment/Interventions (ACUTE ONLY) DME instruction;Gait training;Functional mobility training;Therapeutic activities;Stair training;Therapeutic exercise;Balance training;Patient/family education  AM-PAC PT "6 Clicks" Daily Activity Outcome Measure  Difficulty turning over in bed (including adjusting bedclothes, sheets and blankets)? 3  Difficulty moving from lying on back to sitting on the side of the bed?  1  Difficulty sitting down on and standing up from a chair with arms (e.g., wheelchair, bedside commode, etc,.)? 1  Help needed moving to and from a bed to chair (including a wheelchair)? 3  Help needed walking in hospital room? 2  Help needed climbing 3-5 steps with a railing?  2  6 Click Score 12  Mobility G Code  CL  PT Recommendation  Follow Up Recommendations SNF;Supervision/Assistance - 24 hour  PT equipment Rolling walker with 5" wheels;3in1 (PT)  Individuals Consulted  Consulted and Agree with Results and Recommendations Patient  Acute Rehab PT Goals  Patient Stated Goal to return home  PT Goal Formulation With patient  Time For Goal Achievement 11/23/17  Potential to Achieve Goals Good  PT Time Calculation  PT Start Time (ACUTE ONLY) 1443  PT Stop Time (ACUTE ONLY) 1506  PT Time Calculation (min) (ACUTE ONLY)  23 min  PT General Charges  $$ ACUTE PT VISIT 1 Visit  PT Evaluation  $PT Eval Moderate Complexity 1 Mod  Written Expression  Dominant Hand Right   Pt presenting with problem above with deficits below. Pt with increased fatigue, so only agreeable to transfer to chair. Pt presenting with cognitive deficits and decreased awareness of deficits. Insisting on performing stand pivot transfer independently, however, was very unsteady and required min A +2 for steadying. Pt's oxygen sats decreasing to 85% on RA during bed mobility, however, increased back to >94% on 2L of oxygen. Feel pt is at increased risk for falls and will be unable to perform necessary mobility tasks at home. Will continue to follow acutely to maximize functional mobility independence and safety.   Leighton Ruff, PT, DPT  Acute Rehabilitation Services  Pager: [phone]

## 2017-11-09 NOTE — Evaluation (Addendum)
Occupational Therapy Evaluation Patient Details Name: Howard Bell MRN: [id number] DOB: [birth year] Today's Date: 11/09/2017    History of Present Illness 52 y.o. male with medical history significant of type 2 diabetes on insulin, ESRD on MWF dialysis, hypertension, diabetic gastroparesis, unspecified congestive heart failure, who comes in with altered mental status, nausea, vomiting and found to have multifocal pneumonia with acute hypoxic respiratory failure.    Clinical Impression   This 52 y/o male presents with the above. At baseline pt reports independence with ADLs and functional mobility. No family/caregiver present at time of eval. Pt minimally engaged during session and requires max encouragement for OOB to chair, refusing attempts for completion of further mobility/functional task completion. Pt presenting with cognitive impairments, decreased dynamic balance and activity tolerance. Pt requiring minA+2 for safe completion of stand pivot transfers due to instability with mobility and pt with decreased awareness of current deficits, requiring cues for sequencing and for safety throughout. Pt trialed on RA during initial mobility with SpO2 decreasing to 85% after completion of bed mobility to sit EOB; increased back to >94% with use of 2L supplemental O2.. Pt requires modA for LB ADLs, minguard assist for seated UB ADLs. Pt will benefit from continued acute OT services, given pt's current cognitive status and decreased functional performance with ADLs and mobility recommend follow up therapy services in SNF setting prior to return home to maximize his safety and independence with functional tasks. Will follow.     Follow Up Recommendations  SNF;Supervision/Assistance - 24 hour    Equipment Recommendations  3 in 1 bedside commode;Other (comment)(TBD in next venue )           Precautions / Restrictions Precautions Precautions: Fall Restrictions Weight Bearing Restrictions: No       Mobility Bed Mobility Overal bed mobility: Needs Assistance Bed Mobility: Supine to Sit     Supine to sit: Min guard     General bed mobility comments: for safety, no physical assist required  Transfers Overall transfer level: Needs assistance   Transfers: Sit to/from Stand;Stand Pivot Transfers Sit to Stand: Min guard;Min assist;+2 safety/equipment Stand pivot transfers: Min guard;Min assist;+2 physical assistance;+2 safety/equipment       General transfer comment: pt adamant to perform transfer without assist, though very unsteady with attempts to take steps towards recliner, use of minA+1-2 to ensure safe transfer completion with cues for sequencing and safety     Balance Overall balance assessment: Needs assistance Sitting-balance support: Feet supported Sitting balance-Leahy Scale: Good     Standing balance support: Single extremity supported;No upper extremity supported Standing balance-Leahy Scale: Poor                             ADL either performed or assessed with clinical judgement   ADL Overall ADL's : Needs assistance/impaired Eating/Feeding: Set up;Sitting   Grooming: Set up;Min guard;Sitting   Upper Body Bathing: Min guard;Sitting   Lower Body Bathing: Moderate assistance;Sit to/from stand;+2 for safety/equipment   Upper Body Dressing : Min guard;Sitting   Lower Body Dressing: Moderate assistance;Sit to/from stand;+2 for safety/equipment   Toilet Transfer: Minimal assistance;+2 for physical assistance;+2 for safety/equipment;Stand-pivot;BSC Toilet Transfer Details (indicate cue type and reason): simulated in transfer to [place] and Hygiene: Moderate assistance;Sit to/from stand       Functional mobility during ADLs: Minimal assistance;+2 for physical assistance;+2 for safety/equipment(for stand pivot transfer ) General ADL Comments: pt minimally participative during session; very unsteady  with  stand pivot transfer and requiring multimodal cues throughout; trialed pt on RA during session with lowest SpO2 85% on RA after supine>sitting EOB, rebounds to >94% on 2L supplemental O2      Vision         Perception     Praxis      Pertinent Vitals/Pain Pain Assessment: No/denies pain     Hand Dominance Right   Extremity/Trunk Assessment Upper Extremity Assessment Upper Extremity Assessment: Overall WFL for tasks assessed;Generalized weakness   Lower Extremity Assessment Lower Extremity Assessment: Defer to PT evaluation       Communication Communication Communication: No difficulties   Cognition Arousal/Alertness: Awake/alert Behavior During Therapy: WFL for tasks assessed/performed;Agitated;Flat affect Overall Cognitive Status: No family/caregiver present to determine baseline cognitive functioning Area of Impairment: Problem solving;Safety/judgement;Memory;Awareness;Following commands                     Memory: Decreased short-term memory Following Commands: Follows one step commands with increased time Safety/Judgement: Decreased awareness of safety;Decreased awareness of deficits Awareness: Emergent Problem Solving: Slow processing;Requires verbal cues;Requires tactile cues;Decreased initiation;Difficulty sequencing General Comments: pt with flat affect, not making eye contact with therapist when asking pt questions and appearing very disengaged, minimally participative during session. Pt reports he already walked this AM, in speaking with RN he did so yesterday; requires max multimodal cues to understand/sequence instructions during stand pivot transfer    General Comments       Exercises     Shoulder Instructions      Home Living Family/patient expects to be discharged to:: Private residence Living Arrangements: Spouse/significant other;Children Available Help at Discharge: Family;Available 24 hours/day Type of Home: Apartment Home Access:  Stairs to enter Entrance Stairs-Number of Steps: 14 Entrance Stairs-Rails: Right Home Layout: One level     Bathroom Shower/Tub: Teacher, early years/pre: Standard     Home Equipment: None          Prior Functioning/Environment Level of Independence: Independent        Comments: reports he was driving, reports someone else was driving him to HD         OT Problem List: Decreased strength;Impaired balance (sitting and/or standing);Decreased cognition;Decreased safety awareness;Decreased activity tolerance;Cardiopulmonary status limiting activity      OT Treatment/Interventions: Self-care/ADL training;DME and/or AE instruction;Therapeutic activities;Balance training;Therapeutic exercise;Patient/family education;Energy conservation    OT Goals(Current goals can be found in the care plan section) Acute Rehab OT Goals Patient Stated Goal: to return home OT Goal Formulation: With patient Time For Goal Achievement: 11/23/17 Potential to Achieve Goals: Good  OT Frequency: Min 2X/week   Barriers to D/C:            Co-evaluation PT/OT/SLP Co-Evaluation/Treatment: Yes Reason for Co-Treatment: For patient/therapist safety;To address functional/ADL transfers;Necessary to address cognition/behavior during functional activity   OT goals addressed during session: Strengthening/ROM;ADL's and self-care      AM-PAC PT "6 Clicks" Daily Activity     Outcome Measure Help from another person eating meals?: None Help from another person taking care of personal grooming?: A Little Help from another person toileting, which includes using toliet, bedpan, or urinal?: A Lot Help from another person bathing (including washing, rinsing, drying)?: A Lot Help from another person to put on and taking off regular upper body clothing?: None Help from another person to put on and taking off regular lower body clothing?: A Lot 6 Click Score: 17   End of Session Equipment Utilized  During Treatment: Oxygen  Nurse Communication: Mobility status  Activity Tolerance: Patient tolerated treatment well;Other (comment)(pt self-limiting ) Patient left: in chair;with call bell/phone within reach;with chair alarm set  OT Visit Diagnosis: Unsteadiness on feet (R26.81);Other symptoms and signs involving cognitive function                Time: 6754-4920 OT Time Calculation (min): 23 min Charges:  OT General Charges $OT Visit: 1 Visit OT Evaluation $OT Eval Moderate Complexity: 1 Mod  Howard Bell, Howard Bell 11/09/2017   Howard Bell 11/09/2017, 3:27 PM

## 2017-11-09 NOTE — Progress Notes (Signed)
PROGRESS NOTE    Howard Bell  GQQ:[id number] DOB: 05/11/1965 DOA: 11/06/2017 PCP: Horald Pollen, MD   Brief Narrative: Patient is a 52 year old male with past medical history of type 2 diabetes, ESRD on Monday, Wednesday and Friday, hypertension, diabetic gastroparesis, constipation.  Presents to the emergency room with complaints of altered mental status, nausea, vomiting, shortness of breath.  Patient found to be in acute hypoxic respiratory failure.  Imagings were suggestive of multifocal pneumonia and he was admitted for further management. Patient was started on IV antibiotics.  He is being hemodialyzed during this hospitalization.  Patient's hospital course was remarkable for persistent hypoxia requiring oxygen supplementation.  Assessment & Plan:   Principal Problem:   Multifocal pneumonia Active Problems:   Acute respiratory failure with hypoxemia (HCC)   Diabetes mellitus with complication (HCC)   Elevated troponin   Essential hypertension   ESRD on dialysis (HCC)   Altered mental status   Diabetic gastroparesis (HCC)  Acute hypoxic respiratory failure: Could be secondary to multifocal pneumonia versus fluid overload.  Found to be hypoxic on room air today again even after dialysis.  Will check ABG, echocardiogram and CT chest. Patient might have chronic respiratory insufficiency secondary to CHF secondary to cardiorenal syndrome.  Multifocal pneumonia: Chest imaging suggestive of this.  Showed multifocal pneumonia more on the right.  Currently he is hemo-dynamically stable.  Afebrile.  Does not complain of chest pain or cough.  Continue  broad-spectrum antibiotics for now.  Will plan to taper the antibiotics as soon as possible.  We will follow-up cultures.  Patient has slightly elevated procalcitonin.  Speech also consulted for possible aspiration pneumonia. Last CXR showed possible fluid overload versus pneumonia.  Desaturated on room air.  We will continue to  monitor.  Altered mental status: Intermittently confused mostly at night. Altered mental status on presentation was suspected secondary to hypoglycemia in the setting of poor oral intake.  CT is negative.  No signs of stroke.  Diabetic gastroparesis: Has H/O chronic nausea and vomiting.  He is on metoclopramide at home.  Needs good glycemic control.  Will advise  him to take small volume, frequent meals.  Hypertension: Currently normotensive.  On amlodipine and metoprolol at home which we will continue.  ESRD on dialysis: Nephrology following.  Underwent dialysis today.  Anemia: Most likely associated with CKD.  Continue iron supplementation.  DM type 2:Continue sliding scale insulin.  Weakness/unsteadiness: PT/OT eval the patient recommended skilled nursing facility on discharge.    DVT prophylaxis: heparin [place] Code Status: Full Family Communication: None present at the bedside Disposition Plan: SNF, pending work-up   Consultants: Nephrology  Procedures: Dialysis  Antimicrobials: Vancomycin, cefepime day 4 Subjective: Patient seen and examined the bedside this morning.  Remained comfortable.  Hemodynamically stable.  States he feels better than yesterday.  Unfortunately patient again desaturated on room air after dialysis today.  Objective: Vitals:   11/09/17 1230 11/09/17 1300 11/09/17 1330 11/09/17 1703  BP: 105/79 122/82 118/70 110/61  Pulse: 78 85 83 77  Resp:   13 20  Temp:   (!) 97.4 F (36.3 C) 98.7 F (37.1 C)  TempSrc:    Oral  SpO2:   93% 97%  Weight:   86.5 kg   Height:        Intake/Output Summary (Last 24 hours) at 11/09/2017 1718 Last data filed at 11/09/2017 1433 Gross per 24 hour  Intake 774 ml  Output 3000 ml  Net -2226 ml   [employer]  11/07/17 1200 11/09/17 0858 11/09/17 1330  Weight: 90.5 kg 89.9 kg 86.5 kg    Examination:  General exam: Appears calm and comfortable ,Not in distress,average built HEENT:PERRL,Oral mucosa moist,  Ear/Nose normal on gross exam Respiratory system: Decreased air entry in the bases..   Cardiovascular system: S1 & S2 heard, RRR. No JVD, murmurs, rubs, gallops or clicks. No pedal edema. Gastrointestinal system: Abdomen is nondistended, soft and nontender. No organomegaly or masses felt. Normal bowel sounds heard. Central nervous system: Alert and oriented. No focal neurological deficits. 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 Mood & affect appropriate.     Data Reviewed: I have personally reviewed following labs and imaging studies  CBC: Recent Labs  Lab 11/06/17 0750 11/07/17 0808 11/09/17 0923  WBC 7.1 6.2 6.3  NEUTROABS 4.8  --   --   HGB 11.1* 10.9* 11.3*  HCT 36.0* 34.7* 35.4*  MCV 102.0* 100.3* 97.8  PLT 162 162 606   Basic Metabolic Panel: Recent Labs  Lab 11/06/17 0750 11/07/17 0807 11/08/17 0244 11/09/17 0924  NA 138 137 133* 133*  K 4.0 4.0 3.6 3.9  CL 95* 97* 93* 94*  CO2 28 28 30 27   GLUCOSE 70 138* 153* 143*  BUN 32* 28* 20 31*  CREATININE 11.46* 9.60* 7.12* 10.35*  CALCIUM 9.5 9.7 9.6 10.0  PHOS  --  6.6*  --  4.7*   GFR: Estimated Creatinine Clearance: 9.4 mL/min (A) (by C-G formula based on SCr of 10.35 mg/dL (H)). Liver Function Tests: Recent Labs  Lab 11/06/17 0750 11/07/17 0807 11/09/17 0924  AST 16  --   --   ALT 11  --   --   ALKPHOS 75  --   --   BILITOT 1.7*  --   --   PROT 7.4  --   --   ALBUMIN 3.1* 2.8* 2.7*   Recent Labs  Lab 11/06/17 0750  LIPASE 25   No results for input(s): AMMONIA in the last 168 hours. Coagulation Profile: No results for input(s): INR, PROTIME in the last 168 hours. Cardiac Enzymes: Recent Labs  Lab 11/06/17 0856  TROPONINI 0.07*   BNP (last 3 results) No results for input(s): PROBNP  in the last 8760 hours. HbA1C: No results for input(s): HGBA1C in the last 72 hours. CBG: Recent Labs  Lab 11/08/17 1140 11/08/17 1654 11/08/17 2104 11/09/17 0734 11/09/17 1659  GLUCAP 183* 141* 115* 125* 157*   Lipid Profile: No results for input(s): CHOL, HDL, LDLCALC, TRIG, CHOLHDL, LDLDIRECT in the last 72 hours. Thyroid Function Tests: No results for input(s): TSH, T4TOTAL, FREET4, T3FREE, THYROIDAB in the last 72 hours. Anemia Panel: No results for input(s): VITAMINB12, FOLATE, FERRITIN, TIBC, IRON, RETICCTPCT in the last 72 hours. Sepsis Labs: Recent Labs  Lab 11/06/17 0751 11/06/17 0945 11/06/17 2301 11/07/17 0807 11/08/17 0244  PROCALCITON  --   --  2.04 2.00 2.00  LATICACIDVEN 1.22 1.34  --   --   --     Recent Results (from the past 240 hour(s))  Culture, blood (routine x 2)     Status: None (Preliminary result)   Collection Time: 11/06/17  8:56 AM  Result Value Ref Range Status   Specimen Description BLOOD RIGHT ANTECUBITAL  Final   Special Requests   Final    BOTTLES DRAWN AEROBIC  AND ANAEROBIC Blood Culture adequate volume   Culture   Final    NO GROWTH 3 DAYS Performed at [facility], [place] [address] [place], [place] [address]    Report Status PENDING  Incomplete  Culture, blood (routine x 2)     Status: None (Preliminary result)   Collection Time: 11/06/17  9:39 AM  Result Value Ref Range Status   Specimen Description BLOOD RIGHT HAND  Final   Special Requests   Final    BOTTLES DRAWN AEROBIC AND ANAEROBIC Blood Culture results may not be optimal due to an inadequate volume of blood received in culture bottles   Culture   Final    NO GROWTH 3 DAYS Performed at [facility], [place] [address] [place], [place] [address]    Report Status PENDING  Incomplete         Radiology Studies: Dg Chest 2 View  Result Date: 11/08/2017 CLINICAL DATA:  Shortness of breath and mildly productive cough for the past 2 months. History of diabetes,  end-stage renal disease, CHF. EXAM: CHEST - 2 VIEW COMPARISON:  Chest x-ray of November 06, 2017 FINDINGS: The lungs are borderline hypoinflated especially on the right. The interstitial markings are increased also greatest on the right. Bibasilar densities persist. The cardiac silhouette remains enlarged. The pulmonary vascularity is slightly less engorged today. There is no pleural effusion. IMPRESSION: CHF with mild interstitial edema. Slight interval improvement in the appearance of the pulmonary vascularity since the study of 2 days ago. One cannot exclude superimposed bibasilar pneumonia in the appropriate clinical setting. Electronically Signed   By: David  Martinique M.D.   On: 11/08/2017 09:40        Scheduled Meds: . acetaminophen-codeine  1 tablet Oral QHS  . brimonidine  1 drop Both Eyes BID  . Chlorhexidine Gluconate Cloth  6 each Topical Q0600  . [START ON 11/10/2017] Chlorhexidine Gluconate Cloth  6 each Topical Q0600  . doxercalciferol  7 mcg Intravenous Q M,W,F-HD  . ferric citrate  1,050 mg Oral TID WC  . heparin  5,000 Units Subcutaneous Q8H  . insulin aspart  0-9 Units Subcutaneous TID WC  . metoCLOPramide  5 mg Oral TID AC  . pantoprazole  40 mg Oral Daily  . sodium chloride flush  3 mL Intravenous Q12H  . sucroferric oxyhydroxide  1,500 mg Oral TID WC  . timolol  1 drop Both Eyes BID  . Travoprost (BAK Free)  1 drop Both Eyes QHS   Continuous Infusions: . sodium chloride    . piperacillin-tazobactam (ZOSYN)  IV Stopped (11/09/17 1622)  . vancomycin Stopped (11/09/17 1433)     LOS: 3 days    Time spent:25 mins. More than 50% of that time was spent in counseling and/or coordination of care.      Shelly Coss, MD [facility] Pager [phone]  If 7PM-7AM, please contact night-coverage [email or web address] Password [facility] 11/09/2017, 5:18 PM

## 2017-11-09 NOTE — Progress Notes (Signed)
PT Cancellation Note  Patient Details Name: Howard Bell MRN: [id number] DOB: 14-Dec-1965   Cancelled Treatment:    Reason Eval/Treat Not Completed: Patient at procedure or test/unavailable Pt currently in HD. Will follow up as pt available and as schedule allows.   Howard Bell, PT, DPT  Acute Rehabilitation Services  Pager: [phone]    Rudean Hitt 11/09/2017, 1:55 PM

## 2017-11-09 NOTE — Progress Notes (Signed)
Pharmacy Antibiotic Note  Howard Bell is a 52 y.o. male admitted on 11/06/2017 with pneumonia.  Pharmacy has been consulted for vancomycin dosing.  ESRD on HD MWF. Last HD session was on 8/19.  Plan: Continue Zosyn 3.375 gm IV q12h (4 hour infusion) Continue vancomycin 1g IV QHD-MWF Monitor clinical picture, renal function, pre-HD VR prn F/U C&S, abx deescalation / LOT  Discharge on Augmentin to finish course?   Height: 6' (182.9 cm) Weight: 199 lb 8.3 oz (90.5 kg) IBW/kg (Calculated) : 77.6  Temp (24hrs), Avg:98.8 F (37.1 C), Min:98.5 F (36.9 C), Max:99 F (37.2 C)  Recent Labs  Lab 11/06/17 0750 11/06/17 0751 11/06/17 0945 11/07/17 0807 11/07/17 0808 11/08/17 0244  WBC 7.1  --   --   --  6.2  --   CREATININE 11.46*  --   --  9.60*  --  7.12*  LATICACIDVEN  --  1.22 1.34  --   --   --     Estimated Creatinine Clearance: 13.6 mL/min (A) (by C-G formula based on SCr of 7.12 mg/dL (H)).    No Known Allergies  Thank you for allowing pharmacy to be a part of this patient's care.  Reginia Naas 11/09/2017 8:21 AM

## 2017-11-09 NOTE — Progress Notes (Signed)
SATURATION QUALIFICATIONS: (This note is used to comply with regulatory documentation for home oxygen)  Patient Saturations on Room Air at Rest = 86%  Patient Saturations on Room Air while Ambulating = 85%  Patient Saturations on 2 Liters of oxygen while Ambulating = 92%  Please briefly explain why patient needs home oxygen: Pt oxygen saturations decrease and unable to recover on RA from 85%

## 2017-11-09 NOTE — Progress Notes (Signed)
OT Cancellation Note  Patient Details Name: Belton Peplinski MRN: [id number] DOB: 1965/03/29   Cancelled Treatment:    Reason Eval/Treat Not Completed: Patient at procedure or test/ unavailable; Pt currently in HD. Will follow up as pt is  available and as schedule allows.   Lou Cal, OT Pager [phone] 11/09/2017   Raymondo Band 11/09/2017, 1:58 PM

## 2017-11-09 NOTE — Progress Notes (Signed)
SATURATION QUALIFICATIONS: (This note is used to comply with regulatory documentation for home oxygen)  Patient Saturations on Room Air at Rest = 91%  Patient Saturations on Room Air while Ambulating = 85% (while sitting at EOB)  Patient Saturations on 2 Liters of oxygen while Ambulating = 95%  Please briefly explain why patient needs home oxygen: Pt unable to maintain oxygen sats at appropriate level on RA during bed mobility. Applied 2L of oxygen, and patient sats ranging from 95-97%. Pt required supplemental oxygen to maintain sats that were [facility].   Leighton Ruff, PT, DPT  Acute Rehabilitation Services  Pager: [phone]

## 2017-11-10 ENCOUNTER — Other Ambulatory Visit (HOSPITAL_COMMUNITY): Payer: Medicare Other

## 2017-11-10 LAB — BLOOD GAS, ARTERIAL
Acid-Base Excess: 5.6 mmol/L — ABNORMAL HIGH (ref 0.0–2.0)
Bicarbonate: 29.8 mmol/L — ABNORMAL HIGH (ref 20.0–28.0)
Drawn by: 277361
O2 Content: 2 L/min
O2 Saturation: 95.8 %
Patient temperature: 98.6
pCO2 arterial: 45.5 mmHg (ref 32.0–48.0)
pH, Arterial: 7.432 (ref 7.350–7.450)
pO2, Arterial: 80.7 mmHg — ABNORMAL LOW (ref 83.0–108.0)

## 2017-11-10 LAB — GLUCOSE, CAPILLARY
Glucose-Capillary: 126 mg/dL — ABNORMAL HIGH (ref 70–99)
Glucose-Capillary: 141 mg/dL — ABNORMAL HIGH (ref 70–99)
Glucose-Capillary: 148 mg/dL — ABNORMAL HIGH (ref 70–99)
Glucose-Capillary: 176 mg/dL — ABNORMAL HIGH (ref 70–99)

## 2017-11-10 MED ORDER — AZITHROMYCIN 250 MG PO TABS
500.0000 mg | ORAL_TABLET | Freq: Every day | ORAL | Status: DC
  Administered 2017-11-10 – 2017-11-11 (×2): 500 mg via ORAL
  Filled 2017-11-10 (×2): qty 2

## 2017-11-10 MED ORDER — METOCLOPRAMIDE HCL 5 MG PO TABS
5.0000 mg | ORAL_TABLET | Freq: Two times a day (BID) | ORAL | Status: DC
  Administered 2017-11-11 (×2): 5 mg via ORAL
  Filled 2017-11-10 (×3): qty 1

## 2017-11-10 MED ORDER — VANCOMYCIN HCL 500 MG IV SOLR
500.0000 mg | Freq: Once | INTRAVENOUS | Status: AC
  Administered 2017-11-10: 500 mg via INTRAVENOUS
  Filled 2017-11-10: qty 500

## 2017-11-10 MED ORDER — CHLORHEXIDINE GLUCONATE CLOTH 2 % EX PADS
6.0000 | MEDICATED_PAD | Freq: Every day | CUTANEOUS | Status: DC
  Administered 2017-11-11: 6 via TOPICAL

## 2017-11-10 NOTE — Progress Notes (Signed)
  Speech Language Pathology Treatment: Dysphagia  Patient Details Name: Howard Bell MRN: [id number] DOB: 09/22/1965 Today's Date: 11/10/2017 Time: 3810-1751 SLP Time Calculation (min) (ACUTE ONLY): 20 min  Assessment / Plan / Recommendation Clinical Impression  Pt demonstrated coughing in reclined position during initial repositioning prior to presentation of po's. He successfully transited thin from cup with minimal verbal cues and no overt s/s of penetration or aspiration were observed. Pt exhibited immediate cough following presentation of solid/regular texture. He reported frequent couging and reflux with occasional regurgitation into oral cavity. Pt demonstrated decreased recall/processing of learned information. Given presentation, pt report of cough and reflux, and risk for post prandial aspiration, recommend instrumental MBS eval tomorrow. Continue with current diet with esophageal precautions.    HPI HPI: Howard Bell is a 52 y.o. male with medical history significant of type 2 diabetes on insulin, ESRD on Monday Wednesday Friday dialysis, hypertension, diabetic gastroparesis, unspecified congestive heart failure, who comes in with altered mental status, nausea, vomiting and found to have multifocal pneumonia with acute hypoxic respiratory failure. Per chart wife reports a chronic cough 3-4 months pending work up and repeated episodes of nausea and vomiting and pending EGD for this. Contrasted head CT showed extensive chronic ischemia but no acute process.  X-ray showed hypoinflation.  CTA PE protocol showed no evidence of pulmonary embolism but did show findings concerning for multifocal pneumonia. CXR 8/20 revealed CHF with mild interstitial edema. Slight interval improvement in the appearance of the pulmonary vascularity since the study of 2 days ago. One cannot exclude superimposed bibasilar pneumonia.      SLP Plan  MBS       Recommendations  Diet recommendations:  Regular;Thin liquid;Other(comment)(Stay upright for at least 30 min after meals) Liquids provided via: Cup;No straw Medication Administration: Whole meds with liquid Supervision: Patient able to self feed;Intermittent supervision to cue for compensatory strategies Compensations: Small sips/bites;Slow rate Postural Changes and/or Swallow Maneuvers: Seated upright 90 degrees;Upright 30-60 min after meal                Oral Care Recommendations: Oral care BID SLP Visit Diagnosis: Dysphagia, unspecified (R13.10) Plan: MBS       Jettie Booze, Student SLP                 Jettie Booze 11/10/2017, 4:11 PM

## 2017-11-10 NOTE — NC FL2 (Signed)
[place] MEDICAID FL2 LEVEL OF CARE SCREENING TOOL     IDENTIFICATION  Patient Name: Howard Bell Birthdate: 1965/05/04 Sex: male Admission Date (Current Location): 11/06/2017  [facility] and [place] Number:  Herbalist and Address:  [facility] [place]. [facility], [place] [address] [place], [place] [address]      Provider Number: [id number]  Attending Physician Name and Address:  Shelly Coss, MD  Relative Name and Phone Number:       Current Level of Care: Hospital Recommended Level of Care: [place] Prior Approval Number:    Date Approved/Denied:   PASRR Number: [id number]  Discharge Plan: SNF    Current Diagnoses: Patient Active Problem List   Diagnosis Date Noted  . Multifocal pneumonia 11/06/2017  . Altered mental status 11/06/2017  . Diabetic gastroparesis ([place]) 11/06/2017  . Generalized abdominal pain 09/13/2017  . Acute pain of right shoulder 07/26/2017  . Acute bursitis of right shoulder 07/26/2017  . Left arm swelling 03/28/2017  . ESRD on dialysis ([place]) 03/28/2017  . Chest pain 11/26/2016  . Essential hypertension 11/26/2016  . Acute respiratory failure with hypoxemia ([place]) 08/04/2016  . Diabetes mellitus with complication ([place]) 96/22/2979  . Acute CHF (congestive heart failure) ([place]) 08/04/2016  . Elevated troponin     Orientation RESPIRATION BLADDER Height & Weight     Self, Place  O2(2L [place]) Continent Weight: 190 lb 11.2 oz (86.5 kg) Height:  6' (182.9 cm)  BEHAVIORAL SYMPTOMS/MOOD NEUROLOGICAL BOWEL NUTRITION STATUS      Continent Diet(renal- fluid restriction)  AMBULATORY STATUS COMMUNICATION OF NEEDS Skin   Extensive Assist Verbally Other (Comment)(diabetic ulcer on left and right leg- foam dressing changes PRN)                       Personal Care Assistance Level of Assistance  Bathing, Dressing Bathing Assistance: Maximum assistance   Dressing Assistance: Maximum assistance     Functional  Limitations Info             SPECIAL CARE FACTORS FREQUENCY  PT (By licensed PT), OT (By licensed OT)     PT Frequency: 5/wk OT Frequency: 5/wk            Contractures      Additional Factors Info  Code Status, Allergies Code Status Info: FULL Allergies Info: NKA           Current Medications (11/10/2017):  This is the current hospital active medication list Current Facility-Administered Medications  Medication Dose Route Frequency Provider Last Rate Last Dose  . 0.9 %  sodium chloride infusion  250 mL Intravenous PRN Purohit, Shrey C, MD      . acetaminophen (TYLENOL) tablet 650 mg  650 mg Oral Q6H PRN Purohit, Shrey C, MD       Or  . acetaminophen (TYLENOL) suppository 650 mg  650 mg Rectal Q6H PRN Purohit, Shrey C, MD      . acetaminophen-codeine (TYLENOL #3) 300-30 MG per tablet 1 tablet  1 tablet Oral QHS Purohit, Konrad Dolores, MD   1 tablet at 11/09/17 2223  . azithromycin (ZITHROMAX) tablet 500 mg  500 mg Oral Daily Shelly Coss, MD   500 mg at 11/10/17 0915  . brimonidine (ALPHAGAN) 0.2 % ophthalmic solution 1 drop  1 drop Both Eyes BID Purohit, Shrey C, MD   1 drop at 11/09/17 2224  . Chlorhexidine Gluconate Cloth 2 % PADS 6 each  6 each Topical Q0600 Roney Jaffe,  MD      . doxercalciferol (HECTOROL) injection 7 mcg  7 mcg Intravenous Q M,W,F-HD Valentina Gu, NP   7 mcg at 11/09/17 1221  . ferric citrate (AURYXIA) tablet 1,050 mg  1,050 mg Oral TID WC Purohit, Shrey C, MD   1,050 mg at 11/10/17 0917  . heparin injection 5,000 Units  5,000 Units Subcutaneous Q8H Purohit, Shrey C, MD   5,000 Units at 11/09/17 2223  . insulin aspart (novoLOG) injection 0-9 Units  0-9 Units Subcutaneous TID WC Purohit, Konrad Dolores, MD   1 Units at 11/09/17 0810  . metoCLOPramide (REGLAN) tablet 5 mg  5 mg Oral BID AC Roney Jaffe, MD      . ondansetron [facility]) tablet 4 mg  4 mg Oral Q6H PRN Purohit, Shrey C, MD       Or  . ondansetron (ZOFRAN) injection 4 mg  4 mg  Intravenous Q6H PRN Purohit, Shrey C, MD      . pantoprazole (PROTONIX) EC tablet 40 mg  40 mg Oral Daily Purohit, Shrey C, MD   40 mg at 11/10/17 0917  . piperacillin-tazobactam (ZOSYN) IVPB 3.375 g  3.375 g Intravenous Q12H Reginia Naas, RPH 12.5 mL/hr at 11/09/17 2226 3.375 g at 11/09/17 2226  . polyethylene glycol (MIRALAX / GLYCOLAX) packet 17 g  17 g Oral Daily PRN Purohit, Shrey C, MD      . sodium chloride flush (NS) 0.9 % injection 3 mL  3 mL Intravenous Q12H Purohit, Shrey C, MD   3 mL at 11/10/17 0922  . sodium chloride flush (NS) 0.9 % injection 3 mL  3 mL Intravenous PRN Purohit, Shrey C, MD      . sucroferric oxyhydroxide (VELPHORO) chewable tablet 1,000 mg  1,000 mg Oral Daily PRN Valentina Gu, NP      . sucroferric oxyhydroxide [facility]) chewable tablet 1,500 mg  1,500 mg Oral TID WC Valentina Gu, NP   1,500 mg at 11/10/17 0920  . timolol (TIMOPTIC) 0.5 % ophthalmic solution 1 drop  1 drop Both Eyes BID Purohit, Konrad Dolores, MD   1 drop at 11/09/17 2223  . traMADol (ULTRAM) tablet 50 mg  50 mg Oral Q12H PRN Purohit, Shrey C, MD      . Travoprost (BAK Free) (TRAVATAN) 0.004 % ophthalmic solution SOLN 1 drop  1 drop Both Eyes QHS Purohit, Shrey C, MD   1 drop at 11/09/17 2224  . vancomycin (VANCOCIN) IVPB 1000 mg/200 mL premix  1,000 mg Intravenous Q M,W,F-HD Purohit, Konrad Dolores, MD   Stopped at 11/09/17 1433     Discharge Medications: Please see discharge summary for a list of discharge medications.  Relevant Imaging Results:  Relevant Lab Results:   Additional Information [id number]; pt has dialysis MWF at [facility]- takes SCAT to dialysis  Jorge Ny, LCSW

## 2017-11-10 NOTE — Progress Notes (Signed)
PROGRESS NOTE    Howard Bell  MAU:[id number] DOB: 05-09-1965 DOA: 11/06/2017 PCP: Horald Pollen, MD   Brief Narrative: Patient is a 52 year old male with past medical history of type 2 diabetes, ESRD on Monday, Wednesday and Friday, hypertension, diabetic gastroparesis, constipation.  Presents to the emergency room with complaints of altered mental status, nausea, vomiting, shortness of breath.  Patient found to be in acute hypoxic respiratory failure.  Imagings were suggestive of multifocal pneumonia and he was admitted for further management. Patient was started on IV antibiotics.  He is being hemodialyzed during this hospitalization.  Patient's hospital course was remarkable for persistent hypoxia requiring oxygen supplementation.  Assessment & Plan:   Principal Problem:   Multifocal pneumonia Active Problems:   Acute respiratory failure with hypoxemia (HCC)   Diabetes mellitus with complication (HCC)   Elevated troponin   Essential hypertension   ESRD on dialysis (HCC)   Altered mental status   Diabetic gastroparesis (HCC)  Acute hypoxic respiratory failure: Could be combination of  multifocal pneumonia versus and  fluid overload.  Found to be hypoxic on room air today again.ABG shows hypoxia. CT chest showed patchy infiltrates bilaterally mainly on the right lower lobe.  No new focal infiltrate. Patient might have chronic respiratory insufficiency secondary to CHF secondary to cardiorenal syndrome.Pending echocardiogram .  He will undergo dialysis tomorrow morning.  Multifocal pneumonia: Chest imaging suggestive of this.  Showed multifocal pneumonia more on the right.  Currently he is hemo-dynamically stable.  Afebrile.  Does not complain of chest pain or cough.  Continue  broad-spectrum antibiotics for now.  Will plan to taper the antibiotics as soon as possible.  We will follow-up cultures.  Patient has slightly elevated procalcitonin.  Speech also consulted for  possible aspiration pneumonia.  Altered mental status: Intermittently confused mostly at night. Altered mental status on presentation was suspected secondary to hypoglycemia in the setting of poor oral intake.  CT is negative.  No signs of stroke.  Diabetic gastroparesis: Has H/O chronic nausea and vomiting.  He is on metoclopramide at home.  Needs good glycemic control.  Will advise  him to take small volume, frequent meals.  Hypertension: Currently normotensive.  On amlodipine and metoprolol at home which we will continue.  ESRD on dialysis: Nephrology following.  Dialysis tomorrow  Anemia: Most likely associated with CKD.  Continue iron supplementation.  DM type 2:Continue sliding scale insulin.  Weakness/unsteadiness: PT/OT eval the patient recommended skilled nursing facility on discharge.  Confusion/history of stroke: Patient is intermittently confused but was alert and oriented this morning.  He might have early onset dementia.  CT head on admission showed atrophy and extensive chronic ischemia.    DVT prophylaxis: heparin [place] Code Status: Full Family Communication: Discussed with wife on phone yesterday  disposition Plan: SNF tomorrow  Consultants: Nephrology  Procedures: Dialysis  Antimicrobials: Vancomycin, cefepime day 5 Subjective: Patient seen and examined the bedside this morning.  Desaturates on room air.  Wants to go home.  Discussed the plan about discharging to rehab tomorrow.  Hemodynamically stable. Objective: Vitals:   11/09/17 1703 11/09/17 2314 11/10/17 0500 11/10/17 0907  BP: 110/61 111/72  136/73  Pulse: 77 68  83  Resp: 20     Temp: 98.7 F (37.1 C) 98.6 F (37 C)  (!) 97.4 F (36.3 C)  TempSrc: Oral Oral  Oral  SpO2: 97% 100%  95%  Weight:   86.5 kg   Height:        Intake/Output Summary (  Last 24 hours) at 11/10/2017 1137 Last data filed at 11/10/2017 1100 Gross per 24 hour  Intake 766.91 ml  Output 3000 ml  Net -2233.09 ml   Filed  Weights   11/09/17 0858 11/09/17 1330 11/10/17 0500  Weight: 89.9 kg 86.5 kg 86.5 kg    Examination:  General exam: Appears calm and comfortable ,Not in distress,average built HEENT:PERRL,Oral mucosa moist, Ear/Nose normal on gross exam Respiratory system: Decreased air entry in the bases..   Cardiovascular system: S1 & S2 heard, RRR. No JVD, murmurs, rubs, gallops or clicks. No pedal edema. Gastrointestinal system: Abdomen is nondistended, soft and nontender. No organomegaly or masses felt. Normal bowel sounds heard. Central nervous system: Alert and oriented. No focal neurological deficits. 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 Mood & affect appropriate.     Data Reviewed: I have personally reviewed following labs and imaging studies  CBC: Recent Labs  Lab 11/06/17 0750 11/07/17 0808 11/09/17 0923  WBC 7.1 6.2 6.3  NEUTROABS 4.8  --   --   HGB 11.1* 10.9* 11.3*  HCT 36.0* 34.7* 35.4*  MCV 102.0* 100.3* 97.8  PLT 162 162 299   Basic Metabolic Panel: Recent Labs  Lab 11/06/17 0750 11/07/17 0807 11/08/17 0244 11/09/17 0924  NA 138 137 133* 133*  K 4.0 4.0 3.6 3.9  CL 95* 97* 93* 94*  CO2 28 28 30 27   GLUCOSE 70 138* 153* 143*  BUN 32* 28* 20 31*  CREATININE 11.46* 9.60* 7.12* 10.35*  CALCIUM 9.5 9.7 9.6 10.0  PHOS  --  6.6*  --  4.7*   GFR: Estimated Creatinine Clearance: 9.4 mL/min (A) (by C-G formula based on SCr of 10.35 mg/dL (H)). Liver Function Tests: Recent Labs  Lab 11/06/17 0750 11/07/17 0807 11/09/17 0924  AST 16  --   --   ALT 11  --   --   ALKPHOS 75  --   --   BILITOT 1.7*  --   --   PROT 7.4  --   --   ALBUMIN 3.1* 2.8* 2.7*   Recent Labs  Lab 11/06/17 0750  LIPASE 25   No results for input(s): AMMONIA in the last 168  hours. Coagulation Profile: No results for input(s): INR, PROTIME in the last 168 hours. Cardiac Enzymes: Recent Labs  Lab 11/06/17 0856  TROPONINI 0.07*   BNP (last 3 results) No results for input(s): PROBNP in the last 8760 hours. HbA1C: No results for input(s): HGBA1C in the last 72 hours. CBG: Recent Labs  Lab 11/08/17 2104 11/09/17 0734 11/09/17 1659 11/09/17 2222 11/10/17 0810  GLUCAP 115* 125* 157* 141* 126*   Lipid Profile: No results for input(s): CHOL, HDL, LDLCALC, TRIG, CHOLHDL, LDLDIRECT in the last 72 hours. Thyroid Function Tests: No results for input(s): TSH, T4TOTAL, FREET4, T3FREE, THYROIDAB in the last 72 hours. Anemia Panel: No results for input(s): VITAMINB12, FOLATE, FERRITIN, TIBC, IRON, RETICCTPCT in the last 72 hours. Sepsis Labs: Recent Labs  Lab 11/06/17 0751 11/06/17 0945 11/06/17 2301 11/07/17 0807 11/08/17 0244  PROCALCITON  --   --  2.04 2.00 2.00  LATICACIDVEN 1.22 1.34  --   --   --     Recent Results (from the past 240 hour(s))  Culture, blood (routine x 2)     Status: None (Preliminary result)  Collection Time: 11/06/17  8:56 AM  Result Value Ref Range Status   Specimen Description BLOOD RIGHT ANTECUBITAL  Final   Special Requests   Final    BOTTLES DRAWN AEROBIC AND ANAEROBIC Blood Culture adequate volume   Culture   Final    NO GROWTH 4 DAYS Performed at [facility], [place] [address], [place], [place] [address]    Report Status PENDING  Incomplete  Culture, blood (routine x 2)     Status: None (Preliminary result)   Collection Time: 11/06/17  9:39 AM  Result Value Ref Range Status   Specimen Description BLOOD RIGHT HAND  Final   Special Requests   Final    BOTTLES DRAWN AEROBIC AND ANAEROBIC Blood Culture results may not be optimal due to an inadequate volume of blood received in culture bottles   Culture   Final    NO GROWTH 4 DAYS Performed at [facility], [place] [address] [place], [place] [address]     Report Status PENDING  Incomplete         Radiology Studies: Ct Chest Wo Contrast  Result Date: 11/09/2017 CLINICAL DATA:  Follow-up pneumonia EXAM: CT CHEST WITHOUT CONTRAST TECHNIQUE: Multidetector CT imaging of the chest was performed following the standard protocol without IV contrast. COMPARISON:  11/06/2017 FINDINGS: Cardiovascular: Atherosclerotic calcifications of the thoracic aorta are again noted. Cardiac enlargement is seen. Central venous stenting on the left is noted. Coronary calcifications are noted. Mediastinum/Nodes: Thoracic inlet is within normal limits. Stable right paratracheal, left supraclavicular and subcarinal adenopathy are seen. No new significant lymphadenopathy is noted. Lungs/Pleura: Lungs are well aerated bilaterally. Patchy infiltrates are again seen bilaterally primarily within the right lower lobe. There is been slight improved aeration in the right lower lobe when compared with the prior exam. No new focal infiltrate or effusion is seen. Upper Abdomen: Visualized upper abdomen again shows nodularity of the left adrenal gland. The remainder of the upper abdominal structures are within normal limits. Advanced atherosclerotic changes noted. Musculoskeletal: Degenerative changes of the thoracic spine are noted. No acute bony abnormality is seen. IMPRESSION: Slight improved aeration in the right lower lobe. The remainder of the exam is stable from the prior study. Aortic Atherosclerosis (ICD10-I70.0). Electronically Signed   By: Inez Catalina M.D.   On: 11/09/2017 19:36        Scheduled Meds: . acetaminophen-codeine  1 tablet Oral QHS  . azithromycin  500 mg Oral Daily  . brimonidine  1 drop Both Eyes BID  . Chlorhexidine Gluconate Cloth  6 each Topical Q0600  . Chlorhexidine Gluconate Cloth  6 each Topical Q0600  . doxercalciferol  7 mcg Intravenous Q M,W,F-HD  . ferric citrate  1,050 mg Oral TID WC  . heparin  5,000 Units Subcutaneous Q8H  . insulin aspart   0-9 Units Subcutaneous TID WC  . metoCLOPramide  5 mg Oral BID AC  . pantoprazole  40 mg Oral Daily  . sodium chloride flush  3 mL Intravenous Q12H  . sucroferric oxyhydroxide  1,500 mg Oral TID WC  . timolol  1 drop Both Eyes BID  . Travoprost (BAK Free)  1 drop Both Eyes QHS   Continuous Infusions: . sodium chloride    . piperacillin-tazobactam (ZOSYN)  IV 3.375 g (11/09/17 2226)  . vancomycin Stopped (11/09/17 1433)     LOS: 4 days    Time spent:25 mins. More than 50% of that time was spent in counseling and/or coordination of care.  Shelly Coss, MD [facility] Pager [phone]  If 7PM-7AM, please contact night-coverage [email or web address] Password [facility] 11/10/2017, 11:37 AM

## 2017-11-10 NOTE — Progress Notes (Signed)
Patient states that he is not in the mood to be bothered this morning. He refused all medications and stated that he is to be released today so he does not want to do dialysis. Hemo called and notified.

## 2017-11-10 NOTE — Progress Notes (Addendum)
[facility] Progress Note  Subjective: no c/o, on HD now  Vitals:   11/09/17 1703 11/09/17 2314 11/10/17 0500 11/10/17 0907  BP: 110/61 111/72  136/73  Pulse: 77 68  83  Resp: 20     Temp: 98.7 F (37.1 C) 98.6 F (37 C)  (!) 97.4 F (36.3 C)  TempSrc: Oral Oral  Oral  SpO2: 97% 100%  95%  Weight:   86.5 kg   Height:        Inpatient medications: . acetaminophen-codeine  1 tablet Oral QHS  . azithromycin  500 mg Oral Daily  . brimonidine  1 drop Both Eyes BID  . Chlorhexidine Gluconate Cloth  6 each Topical Q0600  . doxercalciferol  7 mcg Intravenous Q M,W,F-HD  . ferric citrate  1,050 mg Oral TID WC  . heparin  5,000 Units Subcutaneous Q8H  . insulin aspart  0-9 Units Subcutaneous TID WC  . metoCLOPramide  5 mg Oral BID AC  . pantoprazole  40 mg Oral Daily  . sodium chloride flush  3 mL Intravenous Q12H  . sucroferric oxyhydroxide  1,500 mg Oral TID WC  . timolol  1 drop Both Eyes BID  . Travoprost (BAK Free)  1 drop Both Eyes QHS   . sodium chloride    . piperacillin-tazobactam (ZOSYN)  IV 3.375 g (11/09/17 2226)  . vancomycin Stopped (11/09/17 1433)   sodium chloride, acetaminophen **OR** acetaminophen, ondansetron **OR** ondansetron (ZOFRAN) IV, polyethylene glycol, sodium chloride flush, sucroferric oxyhydroxide, traMADol  Iron/TIBC/Ferritin/ %Sat No results found for: IRON, TIBC, FERRITIN, IRONPCTSAT  Exam: No distress, withdrawn and minimally interactive  no jvd  Chest cta bilat  Cor reg no mrg  Abd soft ntnd  Ext trace edema  NF, Ox3   LUA AVG+bruit  Dialysis: MWF SW  4h 71min   98kg  2/2.5 bath  LUA AVG  Hep 9000 -Parsabiv 7.5 mg IV TIW (not on [facility] formulary) -Hectorol 7 mcg IV TIW (Last PTH 1222 10/14/17) -Venofer 50 mg IV weekly (new dose, has not been started yet. Recent Fe load-last Fe 62 Ferritin 971 Tsat 29% 10/14/2017)      Impression: 1  Vol overload/ pulm edema - 6kg under dry wt pre HD today. Still wet by chest CT 2  days ago, plan max UF on HD today, hopefully can get off of O2 2  AMS - persistent issue, confused at night.  Head CT neg.  Possibly dementia per primary notes.  3  HCAP - RLL , per primary team 4  HTN - off of home BP meds now   5  DM per primary 6  Anemia - Hb good, no need for esa  Plan - as above   Kelly Splinter MD [place] pager [phone]   11/10/2017, 11:00 AM   Recent Labs  Lab 11/07/17 0807 11/08/17 0244 11/09/17 0924  NA 137 133* 133*  K 4.0 3.6 3.9  CL 97* 93* 94*  CO2 28 30 27   GLUCOSE 138* 153* 143*  BUN 28* 20 31*  CREATININE 9.60* 7.12* 10.35*  CALCIUM 9.7 9.6 10.0  PHOS 6.6*  --  4.7*  ALBUMIN 2.8*  --  2.7*   Recent Labs  Lab 11/06/17 0750  AST 16  ALT 11  ALKPHOS 75  BILITOT 1.7*  PROT 7.4   Recent Labs  Lab 11/06/17 0750 11/07/17 0808 11/09/17 0923  WBC 7.1 6.2 6.3  NEUTROABS 4.8  --   --   HGB 11.1* 10.9*  11.3*  HCT 36.0* 34.7* 35.4*  MCV 102.0* 100.3* 97.8  PLT 162 162 191

## 2017-11-10 NOTE — Clinical Social Work Note (Signed)
Clinical Social Work Assessment  Patient Details  Name: Howard Bell MRN: [id number] Date of Birth: 1965/10/28  Date of referral:  11/10/17               Reason for consult:  Facility Placement                Permission sought to share information with:    Permission granted to share information::  Yes, Verbal Permission Granted  Name::     Best boy::  SNF  Relationship::  wife  Contact Information:     Housing/Transportation Living arrangements for the past 2 months:  Single Family Home Source of Information:  Patient, Spouse Patient Interpreter Needed:  None Criminal Activity/Legal Involvement Pertinent to Current Situation/Hospitalization:  No - Comment as needed Significant Relationships:  Spouse Lives with:  Minor Children, Spouse Do you feel safe going back to the place where you live?  Yes Need for family participation in patient care:  No (Coment)  Care giving concerns:  Pt lives at home with wife and minor child.  Pt wife works during the day and child is also gone to school.  Pt normally able to manage own needs at home but now concerns with increased weakness and bouts of confusion- wife is concerned he would not be able to remember to get on [employer] bus to dialysis and would become sicker.   Social Worker assessment / plan:  CSW spoke with pt first concerning PT recommendation for SNF.  Pt not interested in SNF- feels as if he is at baseline of functioning and he could manage at home.  Pt provided verbal permission for CSW to discuss with pt wife.  Pt wife has concerns with pt care and safety at home since she is not available to be with him during the day.    Employment status:  Disabled (Comment on whether or not currently receiving Disability) Insurance information:  Medicare PT Recommendations:  [place] / Referral to community resources:  [place]  Patient/Family's Response to care:  Pt initially resistant to SNF  and would prefer to go home with home services- wife was able to speak with pt about concerns and make him agreeable to short term rehab.  Patient/Family's Understanding of and Emotional Response to Diagnosis, Current Treatment, and Prognosis:  Pt wife seemed to have good understanding of pt needs and current medical concerns- hopeful that he will be back at baseline soon.  Emotional Assessment Appearance:  Appears stated age Attitude/Demeanor/Rapport:  Guarded Affect (typically observed):  Appropriate Orientation:  Oriented to Self, Oriented to Place, Oriented to  Time, Oriented to Situation Alcohol / Substance use:  Not Applicable Psych involvement (Current and /or in the community):  No (Comment)  Discharge Needs  Concerns to be addressed:  Care Coordination Readmission within the last 30 days:  No Current discharge risk:  Physical Impairment Barriers to Discharge:  Continued Medical Work up   Jorge Ny, LCSW 11/10/2017, 12:34 PM

## 2017-11-11 ENCOUNTER — Inpatient Hospital Stay (HOSPITAL_COMMUNITY): Payer: Medicare Other

## 2017-11-11 DIAGNOSIS — N185 Chronic kidney disease, stage 5: Secondary | ICD-10-CM | POA: Diagnosis not present

## 2017-11-11 DIAGNOSIS — J690 Pneumonitis due to inhalation of food and vomit: Secondary | ICD-10-CM | POA: Diagnosis present

## 2017-11-11 DIAGNOSIS — R488 Other symbolic dysfunctions: Secondary | ICD-10-CM | POA: Diagnosis present

## 2017-11-11 DIAGNOSIS — I120 Hypertensive chronic kidney disease with stage 5 chronic kidney disease or end stage renal disease: Secondary | ICD-10-CM | POA: Diagnosis not present

## 2017-11-11 DIAGNOSIS — E1143 Type 2 diabetes mellitus with diabetic autonomic (poly)neuropathy: Secondary | ICD-10-CM | POA: Diagnosis not present

## 2017-11-11 DIAGNOSIS — Z9981 Dependence on supplemental oxygen: Secondary | ICD-10-CM | POA: Diagnosis not present

## 2017-11-11 DIAGNOSIS — I110 Hypertensive heart disease with heart failure: Secondary | ICD-10-CM | POA: Diagnosis not present

## 2017-11-11 DIAGNOSIS — D509 Iron deficiency anemia, unspecified: Secondary | ICD-10-CM | POA: Diagnosis not present

## 2017-11-11 DIAGNOSIS — E440 Moderate protein-calorie malnutrition: Secondary | ICD-10-CM | POA: Diagnosis not present

## 2017-11-11 DIAGNOSIS — I132 Hypertensive heart and chronic kidney disease with heart failure and with stage 5 chronic kidney disease, or end stage renal disease: Secondary | ICD-10-CM | POA: Diagnosis not present

## 2017-11-11 DIAGNOSIS — E1165 Type 2 diabetes mellitus with hyperglycemia: Secondary | ICD-10-CM | POA: Diagnosis not present

## 2017-11-11 DIAGNOSIS — J9611 Chronic respiratory failure with hypoxia: Secondary | ICD-10-CM | POA: Diagnosis not present

## 2017-11-11 DIAGNOSIS — D631 Anemia in chronic kidney disease: Secondary | ICD-10-CM | POA: Diagnosis not present

## 2017-11-11 DIAGNOSIS — E1129 Type 2 diabetes mellitus with other diabetic kidney complication: Secondary | ICD-10-CM | POA: Diagnosis not present

## 2017-11-11 DIAGNOSIS — E1139 Type 2 diabetes mellitus with other diabetic ophthalmic complication: Secondary | ICD-10-CM | POA: Diagnosis not present

## 2017-11-11 DIAGNOSIS — M6281 Muscle weakness (generalized): Secondary | ICD-10-CM | POA: Diagnosis present

## 2017-11-11 DIAGNOSIS — K3184 Gastroparesis: Secondary | ICD-10-CM | POA: Diagnosis not present

## 2017-11-11 DIAGNOSIS — Z992 Dependence on renal dialysis: Secondary | ICD-10-CM | POA: Diagnosis not present

## 2017-11-11 DIAGNOSIS — J9621 Acute and chronic respiratory failure with hypoxia: Secondary | ICD-10-CM | POA: Diagnosis not present

## 2017-11-11 DIAGNOSIS — J9601 Acute respiratory failure with hypoxia: Secondary | ICD-10-CM | POA: Diagnosis not present

## 2017-11-11 DIAGNOSIS — N186 End stage renal disease: Secondary | ICD-10-CM | POA: Diagnosis not present

## 2017-11-11 DIAGNOSIS — Z23 Encounter for immunization: Secondary | ICD-10-CM | POA: Diagnosis not present

## 2017-11-11 DIAGNOSIS — I5023 Acute on chronic systolic (congestive) heart failure: Secondary | ICD-10-CM | POA: Diagnosis not present

## 2017-11-11 DIAGNOSIS — N2581 Secondary hyperparathyroidism of renal origin: Secondary | ICD-10-CM | POA: Diagnosis not present

## 2017-11-11 DIAGNOSIS — R52 Pain, unspecified: Secondary | ICD-10-CM | POA: Diagnosis not present

## 2017-11-11 DIAGNOSIS — E8770 Fluid overload, unspecified: Secondary | ICD-10-CM | POA: Diagnosis not present

## 2017-11-11 DIAGNOSIS — E1122 Type 2 diabetes mellitus with diabetic chronic kidney disease: Secondary | ICD-10-CM | POA: Diagnosis not present

## 2017-11-11 DIAGNOSIS — R2689 Other abnormalities of gait and mobility: Secondary | ICD-10-CM | POA: Diagnosis present

## 2017-11-11 DIAGNOSIS — J189 Pneumonia, unspecified organism: Secondary | ICD-10-CM | POA: Diagnosis not present

## 2017-11-11 LAB — CULTURE, BLOOD (ROUTINE X 2)
Culture: NO GROWTH
Culture: NO GROWTH
Special Requests: ADEQUATE

## 2017-11-11 LAB — BASIC METABOLIC PANEL
Anion gap: 11 (ref 5–15)
BUN: 33 mg/dL — ABNORMAL HIGH (ref 6–20)
CO2: 28 mmol/L (ref 22–32)
Calcium: 10.7 mg/dL — ABNORMAL HIGH (ref 8.9–10.3)
Chloride: 94 mmol/L — ABNORMAL LOW (ref 98–111)
Creatinine, Ser: 10.31 mg/dL — ABNORMAL HIGH (ref 0.61–1.24)
GFR calc Af Amer: 6 mL/min — ABNORMAL LOW (ref >60)
GFR calc non Af Amer: 5 mL/min — ABNORMAL LOW (ref >60)
Glucose, Bld: 194 mg/dL — ABNORMAL HIGH (ref 70–99)
Potassium: 4.4 mmol/L (ref 3.5–5.1)
Sodium: 133 mmol/L — ABNORMAL LOW (ref 135–145)

## 2017-11-11 LAB — GLUCOSE, CAPILLARY
Glucose-Capillary: 155 mg/dL — ABNORMAL HIGH (ref 70–99)
Glucose-Capillary: 159 mg/dL — ABNORMAL HIGH (ref 70–99)
Glucose-Capillary: 165 mg/dL — ABNORMAL HIGH (ref 70–99)

## 2017-11-11 LAB — CBC WITH DIFFERENTIAL/PLATELET
Abs Immature Granulocytes: 0 10*3/uL (ref 0.0–0.1)
Basophils Absolute: 0.1 10*3/uL (ref 0.0–0.1)
Basophils Relative: 1 %
Eosinophils Absolute: 0.2 10*3/uL (ref 0.0–0.7)
Eosinophils Relative: 4 %
HCT: 37.5 % — ABNORMAL LOW (ref 39.0–52.0)
Hemoglobin: 11.9 g/dL — ABNORMAL LOW (ref 13.0–17.0)
Immature Granulocytes: 1 %
Lymphocytes Relative: 29 %
Lymphs Abs: 1.6 10*3/uL (ref 0.7–4.0)
MCH: 31.2 pg (ref 26.0–34.0)
MCHC: 31.7 g/dL (ref 30.0–36.0)
MCV: 98.2 fL (ref 78.0–100.0)
Monocytes Absolute: 0.9 10*3/uL (ref 0.1–1.0)
Monocytes Relative: 16 %
Neutro Abs: 2.8 10*3/uL (ref 1.7–7.7)
Neutrophils Relative %: 49 %
Platelets: 212 10*3/uL (ref 150–400)
RBC: 3.82 MIL/uL — ABNORMAL LOW (ref 4.22–5.81)
RDW: 14.6 % (ref 11.5–15.5)
WBC: 5.7 10*3/uL (ref 4.0–10.5)

## 2017-11-11 MED ORDER — DOXERCALCIFEROL 4 MCG/2ML IV SOLN
INTRAVENOUS | Status: AC
  Filled 2017-11-11: qty 4

## 2017-11-11 MED ORDER — TRAMADOL HCL 50 MG PO TABS: 50.0000 mg | ORAL_TABLET | Freq: Two times a day (BID) | ORAL | 0 refills | Status: DC | PRN

## 2017-11-11 MED ORDER — HEPARIN SODIUM (PORCINE) 1000 UNIT/ML DIALYSIS
9000.0000 [IU] | Freq: Once | INTRAMUSCULAR | Status: AC
  Administered 2017-11-11: 9000 [IU] via INTRAVENOUS_CENTRAL

## 2017-11-11 MED ORDER — AMOXICILLIN-POT CLAVULANATE 875-125 MG PO TABS: 1.0000 | ORAL_TABLET | Freq: Two times a day (BID) | ORAL | 0 refills | Status: AC

## 2017-11-11 MED ORDER — VANCOMYCIN HCL IN DEXTROSE 1-5 GM/200ML-% IV SOLN
INTRAVENOUS | Status: AC
  Filled 2017-11-11: qty 200

## 2017-11-11 NOTE — Progress Notes (Signed)
  Speech Language Pathology Patient Details Name: Meade Hogeland MRN: [id number] DOB: Apr 03, 1965 Today's Date: 11/11/2017 Time:  -     Pt was scheduled for MBS this morning however he is in dialysis. Will tentatively plan for MBS today at 1400 if pt has completed HD and is alert and adequate to participate.                 GO              [place] Siren 11/11/2017, 12:09 PM   Orbie Pyo Colvin [place] [phone]

## 2017-11-11 NOTE — Progress Notes (Signed)
Modified Barium Swallow Progress Note  Patient Details  Name: Howard Bell MRN: [id number] Date of Birth: 1965/05/02  Today's Date: 11/11/2017  Modified Barium Swallow completed.  Full report located under Chart Review in the Imaging Section.  Brief recommendations include the following:  Clinical Impression  Thin barium was manipulated, controlled and transited to posterior oral cavity with adequacy. Prolonged mastication noted during cracker without significant oral residue. Difficulty coordinating pill and thin barium simultaneously and needed puree to transit portion of pill pt masticated. Swallow initiation was timely with protective sequences present to prevent penetration and aspiration. Motor and sensory systems intact without residue post swallow. MBS does not diagnose below the level of the upper esophageal sphincter however esophagus was scanned revealing barium stasis in esophageal column primarily with thicker consistencies (puree). This may relate to significant coughing when reclined after po consumption and pt complaint of "food coming back up" and possibility of aspirating esophageal content. Recommend regular texture, thin liquids, esophageal precautions and may benefit from GI consult to further evaluate esophageal function.    Swallow Evaluation Recommendations   Recommended Consults: Consider GI evaluation   SLP Diet Recommendations: Thin liquid;Regular solids   Liquid Administration via: Cup;Straw   Medication Administration: Whole meds with liquid   Supervision: Patient able to self feed   Compensations: Slow rate;Small sips/bites   Postural Changes: Seated upright at 90 degrees;Remain semi-upright after after feeds/meals (Comment)   Oral Care Recommendations: Oral care BID        [place] Siren 11/11/2017,3:53 PM  Orbie Pyo [place] [phone]

## 2017-11-11 NOTE — Progress Notes (Signed)
Patient will discharge to [place] Anticipated discharge date: 8/23 Family notified: pt wife Transportation by [employer]- scheduled for 6pm  CSW signing off.  Jorge Ny, LCSW Clinical Social Worker [phone]

## 2017-11-11 NOTE — Clinical Social Work Placement (Signed)
   CLINICAL SOCIAL WORK PLACEMENT  NOTE  Date:  11/11/2017  Patient Details  Name: Howard Bell Needs MRN: [id number] Date of Birth: [birth year]  Clinical Social Work is seeking post-discharge placement for this patient at the [place] level of care (*CSW will initial, date and re-position this form in  chart as items are completed):  Yes   Patient/family provided with [place] Work Department's list of facilities offering this level of care within the geographic area requested by the patient (or if unable, by the patient's family).  Yes   Patient/family informed of their freedom to choose among providers that offer the needed level of care, that participate in Medicare, Medicaid or managed care program needed by the patient, have an available bed and are willing to accept the patient.  Yes   Patient/family informed of 's ownership interest in [facility] and [facility], as well as of the fact that they are under no obligation to receive care at these facilities.  PASRR submitted to EDS on       PASRR number received on       Existing PASRR number confirmed on 11/10/17     FL2 transmitted to all facilities in geographic area requested by pt/family on 11/10/17     FL2 transmitted to all facilities within larger geographic area on       Patient informed that his/her managed care company has contracts with or will negotiate with certain facilities, including the following:        Yes   Patient/family informed of bed offers received.  Patient chooses bed at Other - please specify in the comment section below:([facility])     Physician recommends and patient chooses bed at      Patient to be transferred to Other - please specify in the comment section below:([facility]) on 11/11/17.  Patient to be transferred to facility by ptar     Patient family notified on 11/11/17 of transfer.  Name of family member notified:  pt  wife     PHYSICIAN Please sign FL2     Additional Comment:    _______________________________________________ Jorge Ny, LCSW 11/11/2017, 2:59 PM

## 2017-11-11 NOTE — Progress Notes (Signed)
PT Cancellation Note  Patient Details Name: Howard Bell MRN: [id number] DOB: 1966-01-01   Cancelled Treatment:    Reason Eval/Treat Not Completed: (P) Patient at procedure or test/unavailable Pt off floor in HD. PT will follow back tomorrow for treatment.    [name] B. Migdalia Dk PT, DPT Acute Rehabilitation  [phone] Pager [phone]     [place] 11/11/2017, 3:48 PM

## 2017-11-12 LAB — HEPATITIS B SURFACE ANTIGEN: Hepatitis B Surface Ag: NEGATIVE

## 2017-11-14 DIAGNOSIS — N2581 Secondary hyperparathyroidism of renal origin: Secondary | ICD-10-CM | POA: Diagnosis not present

## 2017-11-14 DIAGNOSIS — D631 Anemia in chronic kidney disease: Secondary | ICD-10-CM | POA: Diagnosis not present

## 2017-11-14 DIAGNOSIS — E1129 Type 2 diabetes mellitus with other diabetic kidney complication: Secondary | ICD-10-CM | POA: Diagnosis not present

## 2017-11-14 DIAGNOSIS — N186 End stage renal disease: Secondary | ICD-10-CM | POA: Diagnosis not present

## 2017-11-14 DIAGNOSIS — D509 Iron deficiency anemia, unspecified: Secondary | ICD-10-CM | POA: Diagnosis not present

## 2017-11-15 ENCOUNTER — Encounter: Payer: Self-pay | Admitting: Adult Health

## 2017-11-15 ENCOUNTER — Non-Acute Institutional Stay (SKILLED_NURSING_FACILITY): Payer: Medicare Other | Admitting: Adult Health

## 2017-11-15 ENCOUNTER — Other Ambulatory Visit: Payer: Self-pay

## 2017-11-15 DIAGNOSIS — N186 End stage renal disease: Secondary | ICD-10-CM | POA: Diagnosis not present

## 2017-11-15 DIAGNOSIS — K3184 Gastroparesis: Secondary | ICD-10-CM | POA: Diagnosis not present

## 2017-11-15 DIAGNOSIS — E1122 Type 2 diabetes mellitus with diabetic chronic kidney disease: Secondary | ICD-10-CM | POA: Diagnosis not present

## 2017-11-15 DIAGNOSIS — N185 Chronic kidney disease, stage 5: Secondary | ICD-10-CM

## 2017-11-15 DIAGNOSIS — I132 Hypertensive heart and chronic kidney disease with heart failure and with stage 5 chronic kidney disease, or end stage renal disease: Secondary | ICD-10-CM

## 2017-11-15 DIAGNOSIS — J9621 Acute and chronic respiratory failure with hypoxia: Secondary | ICD-10-CM | POA: Diagnosis not present

## 2017-11-15 DIAGNOSIS — E1143 Type 2 diabetes mellitus with diabetic autonomic (poly)neuropathy: Secondary | ICD-10-CM | POA: Diagnosis not present

## 2017-11-15 DIAGNOSIS — R52 Pain, unspecified: Secondary | ICD-10-CM

## 2017-11-15 DIAGNOSIS — I5023 Acute on chronic systolic (congestive) heart failure: Secondary | ICD-10-CM

## 2017-11-15 DIAGNOSIS — H42 Glaucoma in diseases classified elsewhere: Secondary | ICD-10-CM

## 2017-11-15 DIAGNOSIS — E1139 Type 2 diabetes mellitus with other diabetic ophthalmic complication: Secondary | ICD-10-CM | POA: Diagnosis not present

## 2017-11-15 DIAGNOSIS — IMO0002 Reserved for concepts with insufficient information to code with codable children: Secondary | ICD-10-CM

## 2017-11-15 DIAGNOSIS — E1165 Type 2 diabetes mellitus with hyperglycemia: Secondary | ICD-10-CM

## 2017-11-15 DIAGNOSIS — D631 Anemia in chronic kidney disease: Secondary | ICD-10-CM

## 2017-11-15 DIAGNOSIS — J189 Pneumonia, unspecified organism: Secondary | ICD-10-CM | POA: Diagnosis not present

## 2017-11-15 DIAGNOSIS — I110 Hypertensive heart disease with heart failure: Secondary | ICD-10-CM

## 2017-11-15 DIAGNOSIS — G8929 Other chronic pain: Secondary | ICD-10-CM

## 2017-11-15 MED ORDER — ACETAMINOPHEN-CODEINE #3 300-30 MG PO TABS: 1.0000 | ORAL_TABLET | Freq: Every day | ORAL | 0 refills | Status: DC

## 2017-11-15 NOTE — Progress Notes (Signed)
Location:   [facility] Room Number: [place] of Service:  SNF ([id number])   CODE STATUS: Full Code  No Known Allergies  Chief Complaint  Patient presents with  . Hospitalization Follow-up    Hospital follow up    HPI:  He is a 52 year male with a history of diabetes ESRD; diabetic gastroparesis who was hospitalized from 11-06-17 through 11-11-17. He was taken to the ED for altered mental status; nausea and vomiting. He was in acute hypoxic respiratory failure. He was treated for acute hypoxic respiratory failure multifocal pneumonia. He did qualify for home 02. He will need to be seen by GI for an EGD to evaluate any underlying pathology regarding reflux. He will need to follow up with pulmonology for his respiratory failure. He is here for short term rehab with his goal to return back home. He denies any shortness of breath; no nausea or vomiting. He does continue to have a cough. He will continue to be followed for his chronic illnesses including: chf; diabetes; and esrd. There are no nursing concerns at this time.   Past Medical History:  Diagnosis Date  . Congestive heart failure (CHF) ([place])   . 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 Diabetic gastroparesis ([place]) 11/06/2017  . ESRD (end stage renal disease) on dialysis ([place])   . Hypertension     Past Surgical History:  Procedure Laterality Date  . HERNIA REPAIR    . IR AV DIALY SHUNT INTRO NEEDLE/INTRACATH INITIAL W/PTA/IMG LEFT  03/30/2017    Social History   Socioeconomic History  . Marital status: Married    Spouse name: Not on file  . Number of children: Not on file  . Years of education: Not on file  . Highest education level: Not on file  Occupational History  . Not on file  Social Needs  . Financial resource strain: Not on file  . Food insecurity:    Worry: Not on file    Inability: Not on file  . Transportation needs:    Medical: Not on file    Non-medical: Not on  file  Tobacco Use  . Smoking status: Never Smoker  . Smokeless tobacco: Never Used  Substance and Sexual Activity  . Alcohol use: No  . Drug use: No  . Sexual activity: Not on file  Lifestyle  . Physical activity:    Days per week: Not on file    Minutes per session: Not on file  . Stress: Not on file  Relationships  . Social connections:    Talks on phone: Not on file    Gets together: Not on file    Attends religious service: Not on file    Active member of club or organization: Not on file    Attends meetings of clubs or organizations: Not on file    Relationship status: Not on file  . Intimate partner violence:    Fear of current or ex partner: Not on file    Emotionally abused: Not on file    Physically abused: Not on file    Forced sexual activity: Not on file  Other Topics Concern  . Not on file  Social History Narrative  . Not on file   Family History  Problem Relation Age of Onset  . Diabetes Mother   . Hypertension Father       VITAL SIGNS BP 130/80   Pulse 92   Temp (!) 97.4 F (  36.3 C)   Resp 18   Ht 6' (1.829 m)   Wt 190 lb (86.2 kg)   BMI 25.77 kg/m   Outpatient Encounter Medications as of 11/15/2017  Medication Sig  . acetaminophen-codeine (TYLENOL #3) 300-30 MG tablet Take 1 tablet by mouth at bedtime.  Marland Kitchen amLODipine (NORVASC) 10 MG tablet Take 1 tablet (10 mg total) by mouth daily.  Marland Kitchen amoxicillin-clavulanate (AUGMENTIN) 875-125 MG tablet Take 1 tablet by mouth 2 (two) times daily.  Lorin Picket 1 GM 210 MG(Fe) tablet Take 1 g by mouth See admin instructions. TAKE 3 TABLETS BY MOUTH THREE TIMES A DAY WITH MEALS AND 1 TABLET TWICE A DAY WITH SNACKS.  SWALLOW WHOLE, DO NOT CHEW OR CRUSH MEDICATION.  . brimonidine (ALPHAGAN) 0.2 % ophthalmic solution Place 1 drop into both eyes 2 (two) times daily.  Marland Kitchen ENSURE PLUS (ENSURE PLUS) LIQD Take 237 mLs by mouth.  . insulin lispro (HUMALOG KWIKPEN) 100 UNIT/ML KiwkPen Inject as per sliding scale  subcutaneously with meals: 70 - 120 = 0 units 121- 150 = 1 unit 151 - 200 = 2 units 201 - 250 = 3 units 251 - 300 = 5 units 301 - 350 = 7 units 351 - 400 = 9 units Notify MD for BS <70 or >401  . metoCLOPramide (REGLAN) 5 MG tablet Take 1 tablet (5 mg total) by mouth 3 (three) times daily before meals.  . metoprolol succinate (TOPROL-XL) 100 MG 24 hr tablet Take 1 tablet (100 mg total) by mouth daily. Take with or immediately following a meal.  . omeprazole (PRILOSEC) 40 MG capsule Take 40 mg by mouth daily.  . ondansetron (ZOFRAN ODT) 8 MG disintegrating tablet Take 1 tablet (8 mg total) by mouth every 8 (eight) hours as needed for nausea or vomiting.  . timolol (BETIMOL) 0.5 % ophthalmic solution Place 1 drop into both eyes 2 (two) times daily.  . traMADol (ULTRAM) 50 MG tablet Take 1 tablet (50 mg total) by mouth every 12 (twelve) hours as needed.  . travoprost, benzalkonium, (TRAVATAN) 0.004 % ophthalmic solution Place 1 drop into both eyes at bedtime.  . [DISCONTINUED] blood glucose meter kit and supplies KIT Per insurance preference. Check glucose TID. E11.65, Z79.4 (Patient not taking: Reported on 11/15/2017)  . [DISCONTINUED] insulin lispro (HUMALOG KWIKPEN) 100 UNIT/ML KiwkPen Inject 0-0.09 mLs (0-9 Units total) into the skin 3 (three) times daily with meals. CBG < 70: Eat or drink something sweet and recheck. CBG 70 - 120: 0 units CBG 121 - 150: 1 unit CBG 151 - 200: 2 units CBG 201 - 250: 3 units CBG 251 - 300: 5 units CBG 301 - 350: 7 units CBG 351 - 400: 9 units CBG > 400: call MD. (Patient not taking: Reported on 11/15/2017)  . [DISCONTINUED] omeprazole (PRILOSEC) 40 MG capsule TAKE 1 CAPSULE(40 MG) BY MOUTH DAILY (Patient not taking: No sig reported)   No facility-administered encounter medications on file as of 11/15/2017.      SIGNIFICANT DIAGNOSTIC EXAMS  TODAY:   11-06-17: chest x-ray: Hypoinflation without acute cardiopulmonary disease. Stable prominence of the  right perihilar markings. Stable cardiomegaly.  11-06-17: ct of head: Atrophy and extensive chronic ischemia.  No acute abnormality.  11-06-17: ct angio of chest:  1. Multifactorial degradation, including motion and support apparatus artifact. 2. No pulmonary embolism with above limitations. 3. Right greater than left and lower lobe predominant multifocal airspace and ground-glass opacity, most consistent with pneumonia. 4. Thoracic and supraclavicular adenopathy, most  likely reactive. Given the extent of adenopathy, consider chest CT follow-up at 3-6 month. 5.  Aortic Atherosclerosis (ICD10-I70.0). 6. Pulmonary artery enlargement suggests pulmonary arterial hypertension. 7. Age advanced coronary artery atherosclerosis. Recommend assessment of coronary risk factors and consideration of medical therapy. 8. Left adrenal nodule which is technically indeterminate but given stability to 11/18/2016 is favored to be benign, most likely an adenoma.  11-08-17: chest x-ray: CHF with mild interstitial edema. Slight interval improvement in the appearance of the pulmonary vascularity since the study of 2 days ago. One cannot exclude superimposed bibasilar pneumonia in the appropriate clinical setting.  11-09-17: ct of chest: Slight improved aeration in the right lower lobe. The remainder of the exam is stable from the prior study. Aortic Atherosclerosis   LABS REVIEWED: TODAY:   11-06-17: wbc 7.1; hgb 11.1; hct 36.0; mcv 102.0; plt 162; glucose 70; bun 32; creat 11.46; k+ 4.0; na++ 138; ca 9.5 total bili 1.7; albumin 3.1; BNP 1323.3; blood culture: no growth HIV nr  11-09-17: wbc 6.3; hgb 11.3; hct 35.4; mcv 97.8; plt 191; glucose 143; bun 31; creat 1.0.35; k+ 3.9; na++ 133; ca 10.0; phos 4.7; albumin 2.7  11-11-17: wbc 5.7; hgb 11.9; hct 37.5; mcv 98.2; plt 212; glucose 194; bun 33; creat 10.31; k+ 4.4; na++ 133; ca 10.7    Review of Systems  Constitutional: Negative for malaise/fatigue.  Respiratory:  Positive for cough. Negative for shortness of breath.   Cardiovascular: Negative for chest pain, palpitations and leg swelling.  Gastrointestinal: Negative for abdominal pain, constipation and heartburn.  Musculoskeletal: Negative for back pain, joint pain and myalgias.  Skin: Negative.   Neurological: Negative for dizziness.  Psychiatric/Behavioral: The patient is not nervous/anxious.     Physical Exam  Constitutional: He is oriented to person, place, and time. He appears well-developed and well-nourished. No distress.  Overweight   Neck: No thyromegaly present.  Cardiovascular: Normal rate, regular rhythm, normal heart sounds and intact distal pulses.  Pulmonary/Chest: Effort normal. No respiratory distress.  Breath sounds diminished 02 dependent   Abdominal: Soft. Bowel sounds are normal. He exhibits no distension. There is no tenderness.  Musculoskeletal: Normal range of motion. He exhibits no edema.  Lymphadenopathy:    He has no cervical adenopathy.  Neurological: He is alert and oriented to person, place, and time.  Skin: Skin is warm and dry. He is not diaphoretic.  Left upper arm a/v fistula: + thrill + bruit   Psychiatric: He has a normal mood and affect.    ASSESSMENT/ PLAN:  TODAY:   1. 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: is stable will continue toprol xl 100 mg daily   3. Acute on chronic respiratory failure with hypoxemia: is stable is 02 dependent; will continue to monitor his status. He will need a pulmonology consult.   4. Multifocal pneumonia: is stable is 02 dependent will complete augmentin will monitor  5. Diabetic gastroparesis: is stable will continue prilosec 40 mg daily and reglan 5 mg prior to meals. Is due to see GI on 12-05-17 for EGD  6.  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   7. 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 Anemia; due to end stage renal disease: is stable hgb 11.9; is on auryxia 1 gm 3 tabs with meals and 1 tab with snacks  9. 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 Chronic generalized pain: is stable will continue ultram 50 mg twice daily as needed and takes tylenol #3 nightly for pain.     MD is aware of resident's narcotic use and is in agreement with current plan of care. We will attempt to wean resident as apropriate   Ok Edwards NP [facility]  Contact [phone] Monday through Friday 8am- 5pm  After hours call [phone]

## 2017-11-15 NOTE — Telephone Encounter (Signed)
Rx faxed to [facility] (P) [phone], (F) [phone] 

## 2017-11-16 DIAGNOSIS — N2581 Secondary hyperparathyroidism of renal origin: Secondary | ICD-10-CM | POA: Diagnosis not present

## 2017-11-16 DIAGNOSIS — R52 Pain, unspecified: Secondary | ICD-10-CM

## 2017-11-16 DIAGNOSIS — E1139 Type 2 diabetes mellitus with other diabetic ophthalmic complication: Secondary | ICD-10-CM | POA: Insufficient documentation

## 2017-11-16 DIAGNOSIS — E1129 Type 2 diabetes mellitus with other diabetic kidney complication: Secondary | ICD-10-CM | POA: Diagnosis not present

## 2017-11-16 DIAGNOSIS — J9621 Acute and chronic respiratory failure with hypoxia: Secondary | ICD-10-CM | POA: Insufficient documentation

## 2017-11-16 DIAGNOSIS — N185 Chronic kidney disease, stage 5: Secondary | ICD-10-CM | POA: Insufficient documentation

## 2017-11-16 DIAGNOSIS — I110 Hypertensive heart disease with heart failure: Secondary | ICD-10-CM | POA: Insufficient documentation

## 2017-11-16 DIAGNOSIS — I5023 Acute on chronic systolic (congestive) heart failure: Secondary | ICD-10-CM | POA: Insufficient documentation

## 2017-11-16 DIAGNOSIS — N186 End stage renal disease: Secondary | ICD-10-CM | POA: Diagnosis not present

## 2017-11-16 DIAGNOSIS — D631 Anemia in chronic kidney disease: Secondary | ICD-10-CM | POA: Diagnosis not present

## 2017-11-16 DIAGNOSIS — H42 Glaucoma in diseases classified elsewhere: Secondary | ICD-10-CM

## 2017-11-16 DIAGNOSIS — D509 Iron deficiency anemia, unspecified: Secondary | ICD-10-CM | POA: Diagnosis not present

## 2017-11-16 DIAGNOSIS — G8929 Other chronic pain: Secondary | ICD-10-CM | POA: Insufficient documentation

## 2017-11-16 DIAGNOSIS — I132 Hypertensive heart and chronic kidney disease with heart failure and with stage 5 chronic kidney disease, or end stage renal disease: Secondary | ICD-10-CM | POA: Insufficient documentation

## 2017-11-16 DIAGNOSIS — E1122 Type 2 diabetes mellitus with diabetic chronic kidney disease: Secondary | ICD-10-CM | POA: Insufficient documentation

## 2017-11-18 DIAGNOSIS — N2581 Secondary hyperparathyroidism of renal origin: Secondary | ICD-10-CM | POA: Diagnosis not present

## 2017-11-18 DIAGNOSIS — D509 Iron deficiency anemia, unspecified: Secondary | ICD-10-CM | POA: Diagnosis not present

## 2017-11-18 DIAGNOSIS — D631 Anemia in chronic kidney disease: Secondary | ICD-10-CM | POA: Diagnosis not present

## 2017-11-18 DIAGNOSIS — E1129 Type 2 diabetes mellitus with other diabetic kidney complication: Secondary | ICD-10-CM | POA: Diagnosis not present

## 2017-11-18 DIAGNOSIS — N186 End stage renal disease: Secondary | ICD-10-CM | POA: Diagnosis not present

## 2017-11-20 DIAGNOSIS — E1129 Type 2 diabetes mellitus with other diabetic kidney complication: Secondary | ICD-10-CM | POA: Diagnosis not present

## 2017-11-20 DIAGNOSIS — N186 End stage renal disease: Secondary | ICD-10-CM | POA: Diagnosis not present

## 2017-11-20 DIAGNOSIS — Z992 Dependence on renal dialysis: Secondary | ICD-10-CM | POA: Diagnosis not present

## 2017-11-21 DIAGNOSIS — N2581 Secondary hyperparathyroidism of renal origin: Secondary | ICD-10-CM | POA: Diagnosis not present

## 2017-11-21 DIAGNOSIS — Z23 Encounter for immunization: Secondary | ICD-10-CM | POA: Diagnosis not present

## 2017-11-21 DIAGNOSIS — E1129 Type 2 diabetes mellitus with other diabetic kidney complication: Secondary | ICD-10-CM | POA: Diagnosis not present

## 2017-11-21 DIAGNOSIS — D509 Iron deficiency anemia, unspecified: Secondary | ICD-10-CM | POA: Diagnosis not present

## 2017-11-21 DIAGNOSIS — N186 End stage renal disease: Secondary | ICD-10-CM | POA: Diagnosis not present

## 2017-11-22 ENCOUNTER — Non-Acute Institutional Stay (SKILLED_NURSING_FACILITY): Payer: Medicare Other | Admitting: Adult Health

## 2017-11-22 ENCOUNTER — Encounter: Payer: Self-pay | Admitting: Adult Health

## 2017-11-22 DIAGNOSIS — J9601 Acute respiratory failure with hypoxia: Secondary | ICD-10-CM | POA: Diagnosis not present

## 2017-11-22 DIAGNOSIS — E1143 Type 2 diabetes mellitus with diabetic autonomic (poly)neuropathy: Secondary | ICD-10-CM | POA: Diagnosis not present

## 2017-11-22 DIAGNOSIS — I5023 Acute on chronic systolic (congestive) heart failure: Secondary | ICD-10-CM | POA: Diagnosis not present

## 2017-11-22 DIAGNOSIS — K3184 Gastroparesis: Secondary | ICD-10-CM | POA: Diagnosis not present

## 2017-11-22 DIAGNOSIS — N185 Chronic kidney disease, stage 5: Secondary | ICD-10-CM

## 2017-11-22 DIAGNOSIS — I110 Hypertensive heart disease with heart failure: Secondary | ICD-10-CM | POA: Diagnosis not present

## 2017-11-22 DIAGNOSIS — I132 Hypertensive heart and chronic kidney disease with heart failure and with stage 5 chronic kidney disease, or end stage renal disease: Secondary | ICD-10-CM

## 2017-11-22 NOTE — Progress Notes (Signed)
Location:   [facility] Room Number: [place] of Service:      CODE STATUS: Full Code  No Known Allergies  Chief Complaint  Patient presents with  . Medical Management of Chronic Issues    Hypertensive heart disaease; heart failure; respiratory failure; gastroparesis.     HPI:  He is a 52 year old short term rehab patient being seen for the management of his chronic illnesses: heart failure; hypertensive heart disease; respiratory failure; gastroparesis. He denies any cough or shortness of breath; no changes in appetite; no heart burn present. There are no nursing concerns at this time.   Past Medical History:  Diagnosis Date  . Congestive heart failure (CHF) ([place])   . 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 Diabetic gastroparesis ([place]) 11/06/2017  . ESRD (end stage renal disease) on dialysis ([place])   . Hypertension     Past Surgical History:  Procedure Laterality Date  . HERNIA REPAIR    . IR AV DIALY SHUNT INTRO NEEDLE/INTRACATH INITIAL W/PTA/IMG LEFT  03/30/2017    Social History   Socioeconomic History  . Marital status: Married    Spouse name: Not on file  . Number of children: Not on file  . Years of education: Not on file  . Highest education level: Not on file  Occupational History  . Not on file  Social Needs  . Financial resource strain: Not on file  . Food insecurity:    Worry: Not on file    Inability: Not on file  . Transportation needs:    Medical: Not on file    Non-medical: Not on file  Tobacco Use  . Smoking status: Never Smoker  . Smokeless tobacco: Never Used  Substance and Sexual Activity  . Alcohol use: No  . Drug use: No  . Sexual activity: Not on file  Lifestyle  . Physical activity:    Days per week: Not on file    Minutes per session: Not on file  . Stress: Not on file  Relationships  . Social connections:    Talks on phone: Not on file    Gets together: Not on file    Attends  religious service: Not on file    Active member of club or organization: Not on file    Attends meetings of clubs or organizations: Not on file    Relationship status: Not on file  . Intimate partner violence:    Fear of current or ex partner: Not on file    Emotionally abused: Not on file    Physically abused: Not on file    Forced sexual activity: Not on file  Other Topics Concern  . Not on file  Social History Narrative  . Not on file   Family History  Problem Relation Age of Onset  . Diabetes Mother   . Hypertension Father       VITAL SIGNS BP 134/86   Pulse 90   Temp 97.9 F (36.6 C)   Resp 18   Ht 6' (1.829 m)   Wt 191 lb 12.8 oz (87 kg)   SpO2 94%   BMI 26.01 kg/m   Outpatient Encounter Medications as of 11/22/2017  Medication Sig  . acetaminophen-codeine (TYLENOL #3) 300-30 MG tablet Take 1 tablet by mouth at bedtime.  Marland Kitchen amLODipine (NORVASC) 10 MG tablet Take 1 tablet (10 mg total) by mouth daily.  . brimonidine (ALPHAGAN) 0.2 % ophthalmic solution Place 1  drop into both eyes 2 (two) times daily.  Marland Kitchen ENSURE PLUS (ENSURE PLUS) LIQD Take 237 mLs by mouth 3 (three) times daily between meals.   . insulin lispro (HUMALOG KWIKPEN) 100 UNIT/ML KiwkPen Inject as per sliding scale subcutaneously with meals: 70 - 120 = 0 units 121- 150 = 1 unit 151 - 200 = 2 units 201 - 250 = 3 units 251 - 300 = 5 units 301 - 350 = 7 units 351 - 400 = 9 units Notify MD for BS <70 or >401  . metoCLOPramide (REGLAN) 5 MG tablet Take 1 tablet (5 mg total) by mouth 3 (three) times daily before meals.  . metoprolol succinate (TOPROL-XL) 100 MG 24 hr tablet Take 1 tablet (100 mg total) by mouth daily. Take with or immediately following a meal.  . Nutritional Supplements (NUTRITIONAL SUPPLEMENT PO) NAS (No added Salt) diet - Regular Texture, Regular consistency  . omeprazole (PRILOSEC) 40 MG capsule Take 40 mg by mouth daily.  . ondansetron (ZOFRAN ODT) 8 MG disintegrating tablet Take 1  tablet (8 mg total) by mouth every 8 (eight) hours as needed for nausea or vomiting.  . OXYGEN Inhale 2 L/min into the lungs continuous.  . sevelamer carbonate (RENVELA) 800 MG tablet Take 800 mg by mouth 3 (three) times daily with meals.  . timolol (BETIMOL) 0.5 % ophthalmic solution Place 1 drop into both eyes 2 (two) times daily.  . traMADol (ULTRAM) 50 MG tablet Take 1 tablet (50 mg total) by mouth every 12 (twelve) hours as needed.  . travoprost, benzalkonium, (TRAVATAN) 0.004 % ophthalmic solution Place 1 drop into both eyes at bedtime.  . [DISCONTINUED] AURYXIA 1 GM 210 MG(Fe) tablet Take 1 g by mouth See admin instructions. TAKE 3 TABLETS BY MOUTH THREE TIMES A DAY WITH MEALS AND 1 TABLET TWICE A DAY WITH SNACKS.  SWALLOW WHOLE, DO NOT CHEW OR CRUSH MEDICATION.   No facility-administered encounter medications on file as of 11/22/2017.      SIGNIFICANT DIAGNOSTIC EXAMS  PREVIOUS:   11-06-17: chest x-ray: Hypoinflation without acute cardiopulmonary disease. Stable prominence of the right perihilar markings. Stable cardiomegaly.  11-06-17: ct of head: Atrophy and extensive chronic ischemia.  No acute abnormality.  11-06-17: ct angio of chest:  1. Multifactorial degradation, including motion and support apparatus artifact. 2. No pulmonary embolism with above limitations. 3. Right greater than left and lower lobe predominant multifocal airspace and ground-glass opacity, most consistent with pneumonia. 4. Thoracic and supraclavicular adenopathy, most likely reactive. Given the extent of adenopathy, consider chest CT follow-up at 3-6 month. 5.  Aortic Atherosclerosis (ICD10-I70.0). 6. Pulmonary artery enlargement suggests pulmonary arterial hypertension. 7. Age advanced coronary artery atherosclerosis. Recommend assessment of coronary risk factors and consideration of medical therapy. 8. Left adrenal nodule which is technically indeterminate but given stability to 11/18/2016 is favored to  be benign, most likely an adenoma.  11-08-17: chest x-ray: CHF with mild interstitial edema. Slight interval improvement in the appearance of the pulmonary vascularity since the study of 2 days ago. One cannot exclude superimposed bibasilar pneumonia in the appropriate clinical setting.  11-09-17: ct of chest: Slight improved aeration in the right lower lobe. The remainder of the exam is stable from the prior study. Aortic Atherosclerosis   NO NEW EXAMS   LABS REVIEWED: PREVIOUS:   11-06-17: wbc 7.1; hgb 11.1; hct 36.0; mcv 102.0; plt 162; glucose 70; bun 32; creat 11.46; k+ 4.0; na++ 138; ca 9.5 total bili 1.7; albumin 3.1; BNP 1323.3;  blood culture: no growth HIV nr  11-09-17: wbc 6.3; hgb 11.3; hct 35.4; mcv 97.8; plt 191; glucose 143; bun 31; creat 1.0.35; k+ 3.9; na++ 133; ca 10.0; phos 4.7; albumin 2.7  11-11-17: wbc 5.7; hgb 11.9; hct 37.5; mcv 98.2; plt 212; glucose 194; bun 33; creat 10.31; k+ 4.4; na++ 133; ca 10.7   NO NEW LABS.    Review of Systems  Constitutional: Negative for malaise/fatigue.  Respiratory: Negative for cough and shortness of breath.   Cardiovascular: Negative for chest pain, palpitations and leg swelling.  Gastrointestinal: Negative for abdominal pain, constipation and heartburn.  Musculoskeletal: Negative for back pain, joint pain and myalgias.  Skin: Negative.   Neurological: Negative for dizziness.  Psychiatric/Behavioral: The patient is not nervous/anxious.      Physical Exam  Constitutional: He is oriented to person, place, and time. He appears well-developed and well-nourished. No distress.  Overweight   Neck: No thyromegaly present.  Cardiovascular: Normal rate, regular rhythm, normal heart sounds and intact distal pulses.  Pulmonary/Chest: Effort normal and breath sounds normal. No respiratory distress.  02 dependent   Abdominal: Soft. Bowel sounds are normal. He exhibits no distension. There is no tenderness.  Musculoskeletal: Normal range of  motion. He exhibits no edema.  Lymphadenopathy:    He has no cervical adenopathy.  Neurological: He is alert and oriented to person, place, and time.  Skin: Skin is warm and dry. He is not diaphoretic.  Left upper arm a/v fistula: + thrill + bruit    Psychiatric: He has a normal mood and affect.    ASSESSMENT/ PLAN:  TODAY:   1. 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: is stable will continue toprol xl 100 mg daily   3. Acute on chronic respiratory failure with hypoxemia: is stable is 02 dependent; will continue to monitor his status. He will need a pulmonology consult.   4. Diabetic gastroparesis: is stable will continue prilosec 40 mg daily and reglan 5 mg prior to meals. Is due to see GI on 12-05-17 for EGD  PREVIOUS   5. 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   6. CKD stage 5 due to diabetes mellitus: is on hemodialysis three times weekly is follow by nephrology; will continue renvela 800 mg three times daily   7. Anemia; due to end stage renal disease: is stable hgb 11.9; will monitor   8. 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 Chronic generalized pain: is stable will continue ultram 50 mg twice daily as needed and takes tylenol #3 nightly for pain.   MD is aware of resident's narcotic use and is in agreement with current plan of care. We will attempt to wean resident as apropriate   Ok Edwards NP [facility]  Contact [phone] Monday through Friday 8am- 5pm  After hours call  [phone]

## 2017-11-23 DIAGNOSIS — N2581 Secondary hyperparathyroidism of renal origin: Secondary | ICD-10-CM | POA: Diagnosis not present

## 2017-11-23 DIAGNOSIS — E1129 Type 2 diabetes mellitus with other diabetic kidney complication: Secondary | ICD-10-CM | POA: Diagnosis not present

## 2017-11-23 DIAGNOSIS — Z23 Encounter for immunization: Secondary | ICD-10-CM | POA: Diagnosis not present

## 2017-11-23 DIAGNOSIS — N186 End stage renal disease: Secondary | ICD-10-CM | POA: Diagnosis not present

## 2017-11-23 DIAGNOSIS — D509 Iron deficiency anemia, unspecified: Secondary | ICD-10-CM | POA: Diagnosis not present

## 2017-11-24 ENCOUNTER — Non-Acute Institutional Stay (SKILLED_NURSING_FACILITY): Payer: Medicare Other | Admitting: Internal Medicine

## 2017-11-24 ENCOUNTER — Encounter: Payer: Self-pay | Admitting: Internal Medicine

## 2017-11-24 DIAGNOSIS — J9611 Chronic respiratory failure with hypoxia: Secondary | ICD-10-CM

## 2017-11-24 DIAGNOSIS — E1165 Type 2 diabetes mellitus with hyperglycemia: Secondary | ICD-10-CM | POA: Diagnosis not present

## 2017-11-24 DIAGNOSIS — E1143 Type 2 diabetes mellitus with diabetic autonomic (poly)neuropathy: Secondary | ICD-10-CM | POA: Diagnosis not present

## 2017-11-24 DIAGNOSIS — Z992 Dependence on renal dialysis: Secondary | ICD-10-CM

## 2017-11-24 DIAGNOSIS — IMO0002 Reserved for concepts with insufficient information to code with codable children: Secondary | ICD-10-CM

## 2017-11-24 DIAGNOSIS — E1122 Type 2 diabetes mellitus with diabetic chronic kidney disease: Secondary | ICD-10-CM

## 2017-11-24 DIAGNOSIS — K3184 Gastroparesis: Secondary | ICD-10-CM | POA: Diagnosis not present

## 2017-11-24 DIAGNOSIS — E440 Moderate protein-calorie malnutrition: Secondary | ICD-10-CM | POA: Diagnosis not present

## 2017-11-24 DIAGNOSIS — N186 End stage renal disease: Secondary | ICD-10-CM | POA: Diagnosis not present

## 2017-11-24 NOTE — Progress Notes (Addendum)
Provider:  DR Arletha Grippe Location:  [place] Room Number: [place] of Service:  SNF [phone])  PCP: Horald Pollen, MD Patient Care Team: Horald Pollen, MD as PCP - General (Internal Medicine)  Extended Emergency Contact Information Primary Emergency Contact: Daneil [place] Mobile Phone: [phone] Relation: Spouse  Code Status: Full Code Goals of Care: Advanced Directive information Advanced Directives 11/24/2017  Does Patient Have a Medical Advance Directive? No  Would patient like information on creating a medical advance directive? No - Patient declined      Chief Complaint  Patient presents with  . New Admit To SNF    Admission    HPI: Patient is a 52 y.o. male seen today for admission to SNF following hospital stay for acute hypoxic respiratory failure, multifocal R>L pneumonia, diabetic gastroparesis, weakness, confusion, hx CVA, ESRD/HD and DM. CT chest showed patchy infiltrates and fluid overload. He qualified for home O2. He was d/c'd on 5 days of augmentin. He was seen by speech tx. CT head showed atrophy and extensive chronic ischemia. Albumin 2.7; Tbilirubin 1.7; Na 133; Hgb 11.9; BNP 1323.3 at d/c. He presents to SNF for short term rehab.  Today he reports c/a CBGs - range 120-200s. He is not sleeping well. No other concerns. Appetite reduced. No nursing issues. No falls. No f/c. He is a poor historian due to memory loss. Hx obtained from chart. Last a1c in Epic 8.5%. He has an appt with GI 12/05/17 for endoscopy to further evaluate GERD.   Past Medical History:  Diagnosis Date  . Congestive heart failure (CHF) ([place])   . 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 Diabetic gastroparesis ([place]) 11/06/2017  . ESRD (end stage renal disease) on dialysis ([place])   . Hypertension    Past Surgical History:  Procedure Laterality Date  . HERNIA REPAIR    . IR AV DIALY SHUNT INTRO  NEEDLE/INTRACATH INITIAL W/PTA/IMG LEFT  03/30/2017    reports that he has never smoked. He has never used smokeless tobacco. He reports that he does not drink alcohol or use drugs. Social History   Socioeconomic History  . Marital status: Married    Spouse name: Not on file  . Number of children: Not on file  . Years of education: Not on file  . Highest education level: Not on file  Occupational History  . Not on file  Social Needs  . Financial resource strain: Not on file  . Food insecurity:    Worry: Not on file    Inability: Not on file  . Transportation needs:    Medical: Not on file    Non-medical: Not on file  Tobacco Use  . Smoking status: Never Smoker  . Smokeless tobacco: Never Used  Substance and Sexual Activity  . Alcohol use: No  . Drug use: No  . Sexual activity: Not on file  Lifestyle  . Physical activity:    Days per week: Not on file    Minutes per session: Not on file  . Stress: Not on file  Relationships  . Social connections:    Talks on phone: Not on file    Gets together: Not on file    Attends religious service: Not on file    Active member of club or organization: Not on file    Attends meetings of clubs or organizations: Not on file    Relationship status: Not on file  .  Intimate partner violence:    Fear of current or ex partner: Not on file    Emotionally abused: Not on file    Physically abused: Not on file    Forced sexual activity: Not on file  Other Topics Concern  . Not on file  Social History Narrative  . Not on file    Functional Status Survey:    Family History  Problem Relation Age of Onset  . Diabetes Mother   . Hypertension Father     Health Maintenance  Topic Date Due  . OPHTHALMOLOGY EXAM  01/19/2018 (Originally 06/15/1975)  . INFLUENZA VACCINE  02/15/2018 (Originally 10/20/2017)  . FOOT EXAM  11/16/2018 (Originally 06/15/1975)  . HEMOGLOBIN A1C  11/16/2018 (Originally 07/20/2017)  . URINE MICROALBUMIN  11/16/2018  (Originally 06/15/1975)  . COLONOSCOPY  11/23/2018 (Originally 06/15/2017)  . PNEUMOCOCCAL POLYSACCHARIDE VACCINE AGE 60-64 HIGH RISK  Completed  . HIV Screening  Completed  . TETANUS/TDAP  Discontinued    No Known Allergies  Outpatient Encounter Medications as of 11/24/2017  Medication Sig  . acetaminophen-codeine (TYLENOL #3) 300-30 MG tablet Take 1 tablet by mouth at bedtime.  Marland Kitchen amLODipine (NORVASC) 10 MG tablet Take 1 tablet (10 mg total) by mouth daily.  . brimonidine (ALPHAGAN) 0.2 % ophthalmic solution Place 1 drop into both eyes 2 (two) times daily.  Marland Kitchen ENSURE PLUS (ENSURE PLUS) LIQD Take 237 mLs by mouth 3 (three) times daily between meals.   . insulin lispro (HUMALOG KWIKPEN) 100 UNIT/ML KiwkPen Inject as per sliding scale subcutaneously with meals: 70 - 120 = 0 units 121- 150 = 1 unit 151 - 200 = 2 units 201 - 250 = 3 units 251 - 300 = 5 units 301 - 350 = 7 units 351 - 400 = 9 units Notify MD for BS <70 or >401  . metoCLOPramide (REGLAN) 5 MG tablet Take 1 tablet (5 mg total) by mouth 3 (three) times daily before meals.  . metoprolol succinate (TOPROL-XL) 100 MG 24 hr tablet Take 1 tablet (100 mg total) by mouth daily. Take with or immediately following a meal.  . Nutritional Supplements (NUTRITIONAL SUPPLEMENT PO) NAS (No added Salt) diet - Regular Texture, Regular consistency  . omeprazole (PRILOSEC) 40 MG capsule Take 40 mg by mouth daily.  . ondansetron (ZOFRAN ODT) 8 MG disintegrating tablet Take 1 tablet (8 mg total) by mouth every 8 (eight) hours as needed for nausea or vomiting.  . OXYGEN Inhale 2 L/min into the lungs continuous.  . sevelamer carbonate (RENVELA) 800 MG tablet Take 800 mg by mouth 3 (three) times daily with meals.  . timolol (BETIMOL) 0.5 % ophthalmic solution Place 1 drop into both eyes 2 (two) times daily.  . traMADol (ULTRAM) 50 MG tablet Take 1 tablet (50 mg total) by mouth every 12 (twelve) hours as needed.  . travoprost, benzalkonium, (TRAVATAN)  0.004 % ophthalmic solution Place 1 drop into both eyes at bedtime.   No facility-administered encounter medications on file as of 11/24/2017.     Review of Systems  Neurological: Positive for weakness.  Psychiatric/Behavioral: Positive for sleep disturbance.  All other systems reviewed and are negative.   Vitals:   11/24/17 0932  BP: 134/86  Pulse: 90  Resp: 18  Temp: 97.9 F (36.6 C)  SpO2: 97%  Weight: 216 lb 6.4 oz (98.2 kg)  Height: 6' (1.829 m)   Body mass index is 29.35 kg/m. Physical Exam  Constitutional: He is oriented to person, place, and time. He appears  well-developed and well-nourished.  Lying in bed in NAD; [place] O2 @ 2 L/min intact  HENT:  Mouth/Throat: Oropharynx is clear and moist.  MMM; no oral thrush  Eyes: Pupils are equal, round, and reactive to light. No scleral icterus.  Neck: Neck supple. Carotid bruit is not present. No thyromegaly present.  Cardiovascular: Normal rate, regular rhythm and intact distal pulses. Exam reveals no gallop and no friction rub.  Murmur (1/6 SEM) heard. Left arm AVF with thrill and palpable bruit. No LE edema b/l. No calf TTP  Pulmonary/Chest: Effort normal and breath sounds normal. He has no wheezes. He has no rales. He exhibits no tenderness.  Abdominal: Soft. Bowel sounds are normal. He exhibits no distension, no abdominal bruit, no pulsatile midline mass and no mass. There is no hepatomegaly. There is no tenderness. There is no rebound and no guarding.  obese  Lymphadenopathy:    He has no cervical adenopathy.  Neurological: He is alert and oriented to person, place, and time. He has normal reflexes.  Skin: Skin is warm and dry. No rash noted.  Psychiatric: He has a normal mood and affect. His behavior is normal. Judgment and thought content normal.    Labs reviewed: Basic Metabolic Panel: Recent Labs    03/30/17 1025  11/07/17 0807 11/08/17 0244 11/09/17 0924 11/11/17 0307  NA 136   < > 137 133* 133* 133*  K  5.5*   < > 4.0 3.6 3.9 4.4  CL 94*   < > 97* 93* 94* 94*  CO2 24   < > 28 30 27 28   GLUCOSE 170*   < > 138* 153* 143* 194*  BUN 82*   < > 28* 20 31* 33*  CREATININE 17.35*   < > 9.60* 7.12* 10.35* 10.31*  CALCIUM 9.7   < > 9.7 9.6 10.0 10.7*  MG 2.8*  --   --   --   --   --   PHOS 11.3*  --  6.6*  --  4.7*  --    < > = values in this interval not displayed.   Liver Function Tests: Recent Labs    09/09/17 2019 09/20/17 1054 11/06/17 0750 11/07/17 0807 11/09/17 0924  AST 20 15 16   --   --   ALT 16* 16 11  --   --   ALKPHOS 108 119* 75  --   --   BILITOT 1.1 0.4 1.7*  --   --   PROT 8.6* 7.6 7.4  --   --   ALBUMIN 4.0 4.4 3.1* 2.8* 2.7*   Recent Labs    09/09/17 2019 11/06/17 0750  LIPASE 42 25   No results for input(s): AMMONIA in the last 8760 hours. CBC: Recent Labs    09/20/17 1054 11/06/17 0750 11/07/17 0808 11/09/17 0923 11/11/17 0307  WBC 5.5 7.1 6.2 6.3 5.7  NEUTROABS 3.3 4.8  --   --  2.8  HGB 14.2 11.1* 10.9* 11.3* 11.9*  HCT 42.3 36.0* 34.7* 35.4* 37.5*  MCV 95 102.0* 100.3* 97.8 98.2  PLT 162 162 162 191 212   Cardiac Enzymes: Recent Labs    03/28/17 0344 03/28/17 1040 11/06/17 0856  TROPONINI 0.12* 0.12* 0.07*   BNP: Invalid input(s): POCBNP Lab Results  Component Value Date   HGBA1C 8.5 01/20/2017   Lab Results  Component Value Date   TSH 1.679 08/04/2016   No results found for: VITAMINB12 No results found for: FOLATE No results found for: IRON, TIBC, FERRITIN  Imaging and Procedures obtained prior to SNF admission: Dg Chest 2 View  Result Date: 11/06/2017 CLINICAL DATA:  Feeling sick with dizziness and vomiting. Altered mental status. Dialysis. EXAM: CHEST - 2 VIEW COMPARISON:  09/13/2017 and 03/28/2017 FINDINGS: Lungs are adequately inflated demonstrate mild chronic hazy prominence of the right perihilar markings. No lobar consolidation or effusion. Stable cardiomegaly. Left subclavian stents are present. Remainder of the exam is  unchanged. IMPRESSION: Hypoinflation without acute cardiopulmonary disease. Stable prominence of the right perihilar markings. Stable cardiomegaly. Electronically Signed   By: Marin Olp M.D.   On: 11/06/2017 09:32   Ct Head Wo Contrast  Result Date: 11/06/2017 CLINICAL DATA:  Severe headache.  Worst headache of life EXAM: CT HEAD WITHOUT CONTRAST TECHNIQUE: Contiguous axial images were obtained from the base of the skull through the vertex without intravenous contrast. COMPARISON:  None. FINDINGS: Brain: Mild to moderate atrophy.  Negative for hydrocephalus. Extensive chronic ischemic changes. Chronic microvascular ischemia in the white matter bilaterally. 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 Chronic infarct in the left occipital lobe. Extensive chronic infarct in the right superior cerebellum. Negative for acute infarct, hemorrhage, or mass. Benign-appearing calcifications in the basal ganglia and posterior fossa Vascular: Atherosclerotic calcification. Negative for hyperdense vessel Skull: Negative Sinuses/Orbits: Bilateral cataract surgery.  Paranasal sinuses clear Other: None IMPRESSION: Atrophy and extensive chronic ischemia.  No acute abnormality. Electronically Signed   By: Franchot Gallo M.D.   On: 11/06/2017 09:54   Ct Angio Chest Pe W And/or Wo Contrast  Result Date: 11/06/2017 CLINICAL DATA:  Shortness of breath. End-stage renal disease. Diabetes. CHF. EXAM: CT ANGIOGRAPHY CHEST WITH CONTRAST TECHNIQUE: Multidetector CT imaging of the chest was performed using the standard protocol during bolus administration of intravenous contrast. Multiplanar CT image reconstructions and MIPs were obtained to evaluate the vascular anatomy. CONTRAST:  158mL ISOVUE-370 IOPAMIDOL (ISOVUE-370) INJECTION 76% COMPARISON:  09/06/2017 chest radiograph.  No prior CT. FINDINGS: Cardiovascular: The quality of this exam for evaluation of pulmonary embolism is moderate. Limitations include mild motion, EKG  wires and leads. No evidence of pulmonary embolism. A left subclavian venous stent. Aortic and branch vessel atherosclerosis. Tortuous thoracic aorta. Moderate cardiomegaly, without pericardial effusion. Multivessel coronary artery atherosclerosis. Pulmonary artery enlargement, outflow tract 3.5 cm Mediastinum/Nodes: Left supraclavicular node measures 10 mm on image 11/5. No axillary adenopathy. Right paratracheal node measures 1.8 cm on 31/5. A node within the subcarinal station with extension into the azygoesophageal recess measures 3.0 x 2.5 cm on 47/5. Mild prevascular adenopathy. No hilar adenopathy. Lungs/Pleura: No pleural fluid. Extensive artifact from overlying EKG wires and leads. Diffuse air space and less so ground-glass opacity, slightly right lower lobe predominant. Upper Abdomen: Normal imaged portions of the liver, spleen, stomach, pancreas, biliary tract, right adrenal gland. Mild left adrenal nodularity, 1.4 cm. Similar to 11/18/2016. Renal atrophy. Advanced abdominal aortic and branch vessel atherosclerosis. Musculoskeletal: Right greater than left mild gynecomastia. Renal osteodystrophy. Review of the MIP images confirms the above findings. IMPRESSION: 1. Multifactorial degradation, including motion and support apparatus artifact. 2. No pulmonary embolism with above limitations. 3. Right greater than left and lower lobe predominant multifocal airspace and ground-glass opacity, most consistent with pneumonia. 4. Thoracic and supraclavicular adenopathy, most likely reactive. Given the extent of adenopathy, consider chest CT follow-up at 3-6 months. 5.  Aortic Atherosclerosis (ICD10-I70.0). 6. Pulmonary artery enlargement suggests pulmonary arterial hypertension. 7. Age advanced coronary artery atherosclerosis. Recommend assessment of coronary risk factors and consideration of medical therapy. 8. Left adrenal  nodule which is technically indeterminate but given stability to 11/18/2016 is favored to  be benign, most likely an adenoma. Electronically Signed   By: Abigail Miyamoto M.D.   On: 11/06/2017 13:44    Assessment/Plan   ICD-10-CM   1. Moderate protein-calorie malnutrition (HCC) E44.0   2. 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 Diabetic gastroparesis ([place]) E11.43    K31.84   4. ESRD (end stage renal disease) on dialysis ([place]) N18.6    Z99.2   5. Chronic respiratory failure with hypoxia (HCC) J96.11     START NUTRITIONAL SUPPLEMENT TID  Cont current meds as ordered - finish augmentin  PT/OT/ST as ordered  F/u with specialists as scheduled  Cont [place] O2 as ordered  F/u with HD as scheduled  GOAL: short term rehab and d/c home when medically appropriate. Communicated with pt and nursing.  Will follow  Labs/tests ordered: none    Baljit Liebert S. Perlie Gold  [facility] [address] [place], [place] [address] [phone] Cell (Monday-Friday 8 AM - 5 PM) [phone] After 5 PM and follow prompts

## 2017-11-25 DIAGNOSIS — E1129 Type 2 diabetes mellitus with other diabetic kidney complication: Secondary | ICD-10-CM | POA: Diagnosis not present

## 2017-11-25 DIAGNOSIS — D509 Iron deficiency anemia, unspecified: Secondary | ICD-10-CM | POA: Diagnosis not present

## 2017-11-25 DIAGNOSIS — N2581 Secondary hyperparathyroidism of renal origin: Secondary | ICD-10-CM | POA: Diagnosis not present

## 2017-11-25 DIAGNOSIS — N186 End stage renal disease: Secondary | ICD-10-CM | POA: Diagnosis not present

## 2017-11-25 DIAGNOSIS — Z23 Encounter for immunization: Secondary | ICD-10-CM | POA: Diagnosis not present

## 2017-11-28 DIAGNOSIS — Z23 Encounter for immunization: Secondary | ICD-10-CM | POA: Diagnosis not present

## 2017-11-28 DIAGNOSIS — E1129 Type 2 diabetes mellitus with other diabetic kidney complication: Secondary | ICD-10-CM | POA: Diagnosis not present

## 2017-11-28 DIAGNOSIS — D509 Iron deficiency anemia, unspecified: Secondary | ICD-10-CM | POA: Diagnosis not present

## 2017-11-28 DIAGNOSIS — N186 End stage renal disease: Secondary | ICD-10-CM | POA: Diagnosis not present

## 2017-11-28 DIAGNOSIS — N2581 Secondary hyperparathyroidism of renal origin: Secondary | ICD-10-CM | POA: Diagnosis not present

## 2017-11-29 ENCOUNTER — Encounter: Payer: Self-pay | Admitting: Adult Health

## 2017-11-29 ENCOUNTER — Non-Acute Institutional Stay (SKILLED_NURSING_FACILITY): Payer: Medicare Other | Admitting: Adult Health

## 2017-11-29 ENCOUNTER — Other Ambulatory Visit: Payer: Self-pay

## 2017-11-29 DIAGNOSIS — J9601 Acute respiratory failure with hypoxia: Secondary | ICD-10-CM | POA: Diagnosis not present

## 2017-11-29 DIAGNOSIS — I110 Hypertensive heart disease with heart failure: Secondary | ICD-10-CM | POA: Diagnosis not present

## 2017-11-29 DIAGNOSIS — N185 Chronic kidney disease, stage 5: Secondary | ICD-10-CM | POA: Diagnosis not present

## 2017-11-29 DIAGNOSIS — I132 Hypertensive heart and chronic kidney disease with heart failure and with stage 5 chronic kidney disease, or end stage renal disease: Secondary | ICD-10-CM | POA: Diagnosis not present

## 2017-11-29 DIAGNOSIS — I5023 Acute on chronic systolic (congestive) heart failure: Secondary | ICD-10-CM

## 2017-11-29 MED ORDER — TRAMADOL HCL 50 MG PO TABS: 50.0000 mg | ORAL_TABLET | Freq: Two times a day (BID) | ORAL | 0 refills | Status: DC | PRN

## 2017-11-29 MED ORDER — ACETAMINOPHEN-CODEINE #3 300-30 MG PO TABS: 1.0000 | ORAL_TABLET | Freq: Every day | ORAL | 0 refills | Status: DC

## 2017-11-29 NOTE — Progress Notes (Signed)
Location:   [facility] Room Number: [place] of Service:  SNF (31)    CODE STATUS: Full Code  No Known Allergies  Chief Complaint  Patient presents with  . Discharge Note    Discharging to home on 12/02/17    HPI:  He is being discharged to home with home health for pt/ot/rn. He will need a standard wheelchair; and will need chronic 02. His 02 sats with exercise on room is 77%; with 02 at 2L/min 96%. He will need his prescriptions written and will need to follow up with his medical provider.    Past Medical History:  Diagnosis Date  . Congestive heart failure (CHF) ([place])   . 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 Diabetic gastroparesis ([place]) 11/06/2017  . ESRD (end stage renal disease) on dialysis ([place])   . Hypertension     Past Surgical History:  Procedure Laterality Date  . HERNIA REPAIR    . IR AV DIALY SHUNT INTRO NEEDLE/INTRACATH INITIAL W/PTA/IMG LEFT  03/30/2017    Social History   Socioeconomic History  . Marital status: Married    Spouse name: Not on file  . Number of children: Not on file  . Years of education: Not on file  . Highest education level: Not on file  Occupational History  . Not on file  Social Needs  . Financial resource strain: Not on file  . Food insecurity:    Worry: Not on file    Inability: Not on file  . Transportation needs:    Medical: Not on file    Non-medical: Not on file  Tobacco Use  . Smoking status: Never Smoker  . Smokeless tobacco: Never Used  Substance and Sexual Activity  . Alcohol use: No  . Drug use: No  . Sexual activity: Not on file  Lifestyle  . Physical activity:    Days per week: Not on file    Minutes per session: Not on file  . Stress: Not on file  Relationships  . Social connections:    Talks on phone: Not on file    Gets together: Not on file    Attends religious service: Not on file    Active member of club or organization: Not on file   Attends meetings of clubs or organizations: Not on file    Relationship status: Not on file  . Intimate partner violence:    Fear of current or ex partner: Not on file    Emotionally abused: Not on file    Physically abused: Not on file    Forced sexual activity: Not on file  Other Topics Concern  . Not on file  Social History Narrative  . Not on file   Family History  Problem Relation Age of Onset  . Diabetes Mother   . Hypertension Father     VITAL SIGNS BP 110/64   Pulse 72   Temp (!) 96 F (35.6 C)   Resp (!) 22   Ht 6' (1.829 m)   Wt 216 lb (98 kg)   SpO2 94%   BMI 29.29 kg/m   Patient's Medications  New Prescriptions   No medications on file  Previous Medications   ACETAMINOPHEN-CODEINE (TYLENOL #3) 300-30 MG TABLET    Take 1 tablet by mouth at bedtime.   AMLODIPINE (NORVASC) 10 MG TABLET    Take 1 tablet (10 mg total) by mouth daily.   BRIMONIDINE (ALPHAGAN) 0.2 % OPHTHALMIC  SOLUTION    Place 1 drop into both eyes 2 (two) times daily.   ENSURE PLUS (ENSURE PLUS) LIQD    Take 237 mLs by mouth 3 (three) times daily between meals.    INSULIN LISPRO (HUMALOG KWIKPEN) 100 UNIT/ML KIWKPEN    Inject as per sliding scale subcutaneously with meals: 70 - 120 = 0 units 121- 150 = 1 unit 151 - 200 = 2 units 201 - 250 = 3 units 251 - 300 = 5 units 301 - 350 = 7 units 351 - 400 = 9 units Notify MD for BS <70 or >401   METOCLOPRAMIDE (REGLAN) 5 MG TABLET    Take 1 tablet (5 mg total) by mouth 3 (three) times daily before meals.   METOPROLOL SUCCINATE (TOPROL-XL) 100 MG 24 HR TABLET    Take 1 tablet (100 mg total) by mouth daily. Take with or immediately following a meal.   NUTRITIONAL SUPPLEMENTS (NUTRITIONAL SUPPLEMENT PO)    NAS (No added Salt) diet - Regular Texture, Regular consistency   OMEPRAZOLE (PRILOSEC) 40 MG CAPSULE    Take 40 mg by mouth daily.   ONDANSETRON (ZOFRAN ODT) 8 MG DISINTEGRATING TABLET    Take 1 tablet (8 mg total) by mouth every 8 (eight) hours as  needed for nausea or vomiting.   OXYGEN    Inhale 2 L/min into the lungs continuous.   SEVELAMER CARBONATE (RENVELA) 800 MG TABLET    Take 800 mg by mouth 3 (three) times daily with meals.   TIMOLOL (BETIMOL) 0.5 % OPHTHALMIC SOLUTION    Place 1 drop into both eyes 2 (two) times daily.   TRAMADOL (ULTRAM) 50 MG TABLET    Take 1 tablet (50 mg total) by mouth every 12 (twelve) hours as needed.   TRAVOPROST, BENZALKONIUM, (TRAVATAN) 0.004 % OPHTHALMIC SOLUTION    Place 1 drop into both eyes at bedtime.  Modified Medications   No medications on file  Discontinued Medications   No medications on file     SIGNIFICANT DIAGNOSTIC EXAMS  PREVIOUS:   11-06-17: chest x-ray: Hypoinflation without acute cardiopulmonary disease. Stable prominence of the right perihilar markings. Stable cardiomegaly.  11-06-17: ct of head: Atrophy and extensive chronic ischemia.  No acute abnormality.  11-06-17: ct angio of chest:  1. Multifactorial degradation, including motion and support apparatus artifact. 2. No pulmonary embolism with above limitations. 3. Right greater than left and lower lobe predominant multifocal airspace and ground-glass opacity, most consistent with pneumonia. 4. Thoracic and supraclavicular adenopathy, most likely reactive. Given the extent of adenopathy, consider chest CT follow-up at 3-6 month. 5.  Aortic Atherosclerosis (ICD10-I70.0). 6. Pulmonary artery enlargement suggests pulmonary arterial hypertension. 7. Age advanced coronary artery atherosclerosis. Recommend assessment of coronary risk factors and consideration of medical therapy. 8. Left adrenal nodule which is technically indeterminate but given stability to 11/18/2016 is favored to be benign, most likely an adenoma.  11-08-17: chest x-ray: CHF with mild interstitial edema. Slight interval improvement in the appearance of the pulmonary vascularity since the study of 2 days ago. One cannot exclude superimposed bibasilar  pneumonia in the appropriate clinical setting.  11-09-17: ct of chest: Slight improved aeration in the right lower lobe. The remainder of the exam is stable from the prior study. Aortic Atherosclerosis   NO NEW EXAMS .  LABS REVIEWED: PREVIOUS:   11-06-17: wbc 7.1; hgb 11.1; hct 36.0; mcv 102.0; plt 162; glucose 70; bun 32; creat 11.46; k+ 4.0; na++ 138; ca 9.5 total bili 1.7; albumin  3.1; BNP 1323.3; blood culture: no growth HIV nr  11-09-17: wbc 6.3; hgb 11.3; hct 35.4; mcv 97.8; plt 191; glucose 143; bun 31; creat 1.0.35; k+ 3.9; na++ 133; ca 10.0; phos 4.7; albumin 2.7  11-11-17: wbc 5.7; hgb 11.9; hct 37.5; mcv 98.2; plt 212; glucose 194; bun 33; creat 10.31; k+ 4.4; na++ 133; ca 10.7   NO NEW LABS.    Review of Systems  Constitutional: Negative for malaise/fatigue.  Respiratory: Negative for cough and shortness of breath.   Cardiovascular: Negative for chest pain, palpitations and leg swelling.  Gastrointestinal: Negative for abdominal pain, constipation and heartburn.  Musculoskeletal: Negative for back pain, joint pain and myalgias.  Skin: Negative.   Neurological: Negative for dizziness.  Psychiatric/Behavioral: The patient is not nervous/anxious.     Physical Exam  Constitutional: He is oriented to person, place, and time. He appears well-developed and well-nourished. No distress.  Overweight   Neck: No thyromegaly present.  Cardiovascular: Normal rate, regular rhythm, normal heart sounds and intact distal pulses.  Pulmonary/Chest: Effort normal and breath sounds normal. No respiratory distress.  02 dependent   Abdominal: Soft. Bowel sounds are normal. He exhibits no distension. There is no tenderness.  Musculoskeletal: Normal range of motion. He exhibits no edema.  Lymphadenopathy:    He has no cervical adenopathy.  Neurological: He is alert and oriented to person, place, and time.  Skin: Skin is warm and dry. He is not diaphoretic.  Left upper arm a/v fistula: +  thrill + bruit    Psychiatric: He has a normal mood and affect.    ASSESSMENT/ PLAN:  Patient is being discharged with the following home health services:  Pt/ot/rn: to evaluate and treat as indicated for gait balance strength; adl training and medication management.   Patient is being discharged with the following durable medical equipment:  Standard wheelchair with elevated leg rests cushion anti-tippers; brake extensions to allow him to maintain his current level of independence with his adls which cannot be achieved with a walker; can self proprel  02 at 2L/min nasal cannula both stationary and portable gaseous.   Patient has been advised to f/u with their PCP in 1-2 weeks to bring them up to date on their rehab stay.  Social services at facility was responsible for arranging this appointment.  Pt was provided with a 30 day supply of prescriptions for medications and refills must be obtained from their PCP.  For controlled substances, a more limited supply may be provided adequate until PCP appointment only.   A 30 day supply of his prescription medications have been written as listed above #15 tylenol #3 tabs #10 ultram 50 mg tabs.   Time spent with patient 45 minutes: discussed medications; home health needs; expectations; and dme; and 02 needs. Verbalized understanding.     Ok Edwards NP [facility]  Contact [phone] Monday through Friday 8am- 5pm  After hours call [phone]

## 2017-11-29 NOTE — Telephone Encounter (Signed)
Discharged with patient 

## 2017-11-30 ENCOUNTER — Other Ambulatory Visit: Payer: Self-pay

## 2017-11-30 DIAGNOSIS — N186 End stage renal disease: Secondary | ICD-10-CM | POA: Diagnosis not present

## 2017-11-30 DIAGNOSIS — Z23 Encounter for immunization: Secondary | ICD-10-CM | POA: Diagnosis not present

## 2017-11-30 DIAGNOSIS — E1129 Type 2 diabetes mellitus with other diabetic kidney complication: Secondary | ICD-10-CM | POA: Diagnosis not present

## 2017-11-30 DIAGNOSIS — D509 Iron deficiency anemia, unspecified: Secondary | ICD-10-CM | POA: Diagnosis not present

## 2017-11-30 DIAGNOSIS — R1012 Left upper quadrant pain: Secondary | ICD-10-CM

## 2017-11-30 DIAGNOSIS — N2581 Secondary hyperparathyroidism of renal origin: Secondary | ICD-10-CM | POA: Diagnosis not present

## 2017-11-30 DIAGNOSIS — R112 Nausea with vomiting, unspecified: Secondary | ICD-10-CM

## 2017-12-02 ENCOUNTER — Telehealth: Payer: Self-pay | Admitting: Emergency Medicine

## 2017-12-02 DIAGNOSIS — Z23 Encounter for immunization: Secondary | ICD-10-CM | POA: Diagnosis not present

## 2017-12-02 DIAGNOSIS — N2581 Secondary hyperparathyroidism of renal origin: Secondary | ICD-10-CM | POA: Diagnosis not present

## 2017-12-02 DIAGNOSIS — D509 Iron deficiency anemia, unspecified: Secondary | ICD-10-CM | POA: Diagnosis not present

## 2017-12-02 DIAGNOSIS — E1129 Type 2 diabetes mellitus with other diabetic kidney complication: Secondary | ICD-10-CM | POA: Diagnosis not present

## 2017-12-02 DIAGNOSIS — N186 End stage renal disease: Secondary | ICD-10-CM | POA: Diagnosis not present

## 2017-12-02 NOTE — Telephone Encounter (Signed)
Copied from [place] [id number]. Topic: General - Other >> Dec 02, 2017  1:32 PM Margot Ables wrote: Reason for CRM: Pt d/c from [facility]. [facility] received a referral for services but they have not been in contact with the patient. They are hoping to get in contact and see pt for first visit 12/06/17.

## 2017-12-03 ENCOUNTER — Other Ambulatory Visit: Payer: Self-pay | Admitting: Family Medicine

## 2017-12-05 ENCOUNTER — Encounter (HOSPITAL_COMMUNITY)
Admission: RE | Disposition: A | Discharge status: Home / Self Care | Payer: Self-pay | Source: Ambulatory Visit | Attending: Gastroenterology

## 2017-12-05 ENCOUNTER — Ambulatory Visit (HOSPITAL_COMMUNITY): Payer: Medicare Other | Admitting: Anesthesiology

## 2017-12-05 ENCOUNTER — Ambulatory Visit (HOSPITAL_COMMUNITY)
Admission: RE | Admit: 2017-12-05 | Discharge: 2017-12-05 | Disposition: A | Discharge status: Home / Self Care | Payer: Medicare Other | Source: Ambulatory Visit | Attending: Gastroenterology | Admitting: Gastroenterology

## 2017-12-05 ENCOUNTER — Other Ambulatory Visit: Payer: Self-pay

## 2017-12-05 ENCOUNTER — Encounter (HOSPITAL_COMMUNITY): Payer: Self-pay | Admitting: Emergency Medicine

## 2017-12-05 DIAGNOSIS — N186 End stage renal disease: Secondary | ICD-10-CM | POA: Insufficient documentation

## 2017-12-05 DIAGNOSIS — I509 Heart failure, unspecified: Secondary | ICD-10-CM | POA: Insufficient documentation

## 2017-12-05 DIAGNOSIS — K3184 Gastroparesis: Secondary | ICD-10-CM | POA: Insufficient documentation

## 2017-12-05 DIAGNOSIS — Z8701 Personal history of pneumonia (recurrent): Secondary | ICD-10-CM | POA: Insufficient documentation

## 2017-12-05 DIAGNOSIS — R1012 Left upper quadrant pain: Secondary | ICD-10-CM

## 2017-12-05 DIAGNOSIS — E1122 Type 2 diabetes mellitus with diabetic chronic kidney disease: Secondary | ICD-10-CM | POA: Diagnosis not present

## 2017-12-05 DIAGNOSIS — R112 Nausea with vomiting, unspecified: Secondary | ICD-10-CM

## 2017-12-05 DIAGNOSIS — Z794 Long term (current) use of insulin: Secondary | ICD-10-CM | POA: Diagnosis not present

## 2017-12-05 DIAGNOSIS — E1143 Type 2 diabetes mellitus with diabetic autonomic (poly)neuropathy: Secondary | ICD-10-CM | POA: Insufficient documentation

## 2017-12-05 DIAGNOSIS — I132 Hypertensive heart and chronic kidney disease with heart failure and with stage 5 chronic kidney disease, or end stage renal disease: Secondary | ICD-10-CM | POA: Diagnosis not present

## 2017-12-05 DIAGNOSIS — N2581 Secondary hyperparathyroidism of renal origin: Secondary | ICD-10-CM | POA: Diagnosis not present

## 2017-12-05 DIAGNOSIS — E1129 Type 2 diabetes mellitus with other diabetic kidney complication: Secondary | ICD-10-CM | POA: Diagnosis not present

## 2017-12-05 DIAGNOSIS — Z23 Encounter for immunization: Secondary | ICD-10-CM | POA: Diagnosis not present

## 2017-12-05 DIAGNOSIS — D509 Iron deficiency anemia, unspecified: Secondary | ICD-10-CM | POA: Diagnosis not present

## 2017-12-05 HISTORY — PX: ESOPHAGOGASTRODUODENOSCOPY (EGD) WITH PROPOFOL: SHX5813

## 2017-12-05 LAB — POCT I-STAT 4, (NA,K, GLUC, HGB,HCT)
Glucose, Bld: 218 mg/dL — ABNORMAL HIGH (ref 70–99)
HCT: 41 % (ref 39.0–52.0)
Hemoglobin: 13.9 g/dL (ref 13.0–17.0)
Potassium: 4.7 mmol/L (ref 3.5–5.1)
Sodium: 138 mmol/L (ref 135–145)

## 2017-12-05 LAB — GLUCOSE, CAPILLARY: Glucose-Capillary: 224 mg/dL — ABNORMAL HIGH (ref 70–99)

## 2017-12-05 SURGERY — ESOPHAGOGASTRODUODENOSCOPY (EGD) WITH PROPOFOL
Anesthesia: Monitor Anesthesia Care

## 2017-12-05 MED ORDER — PROPOFOL 10 MG/ML IV BOLUS
INTRAVENOUS | Status: DC | PRN
  Administered 2017-12-05: 50 mg via INTRAVENOUS
  Administered 2017-12-05: 20 mg via INTRAVENOUS

## 2017-12-05 MED ORDER — PROPOFOL 10 MG/ML IV BOLUS
INTRAVENOUS | Status: AC
  Filled 2017-12-05: qty 40

## 2017-12-05 MED ORDER — SODIUM CHLORIDE 0.9 % IV SOLN
INTRAVENOUS | Status: DC
  Administered 2017-12-05: 09:00:00 via INTRAVENOUS

## 2017-12-05 MED ORDER — PROPOFOL 500 MG/50ML IV EMUL
INTRAVENOUS | Status: DC | PRN
  Administered 2017-12-05: 100 ug/kg/min via INTRAVENOUS

## 2017-12-05 SURGICAL SUPPLY — 14 items

## 2017-12-05 NOTE — Telephone Encounter (Signed)
Metoprolol ER 100 mg refill Last Refill:03/30/17 # 30 Last OV: 09/20/17 PCP: Leane Platt  Pharmacy:[facility] [phone] Prescribed by a historical provider.  Humalog 100 Units refill Last Refill:11/15/17 # ? Last OV: 09/20/17 PCP: Leane Platt Prescribed by a historical provider.  sevelamer carbonate 800 mg refill Last Refill:11/18/17 # 270 Last OV: 09/20/17 PCP: Leane Platt Prescribed by a historical provider.

## 2017-12-05 NOTE — H&P (Signed)
History:  This patient presents for endoscopic testing for LUQ pain, nausea and vomiting See 11/01/17 Office visit.  Patient has since been admitted for cough and hypoxia.  Admission H+P and discharge summary reviewed - patient on supplemental oxygen, went to rehab and home on 12/02/17.  [name] Referring physician: Horald Pollen, MD  Past Medical History: Past Medical History:  Diagnosis Date  . Congestive heart failure (CHF) ([place])   . 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 Diabetic gastroparesis ([place]) 11/06/2017  . ESRD (end stage renal disease) on dialysis ([place])   . Hypertension      Past Surgical History: Past Surgical History:  Procedure Laterality Date  . HERNIA REPAIR    . IR AV DIALY SHUNT INTRO NEEDLE/INTRACATH INITIAL W/PTA/IMG LEFT  03/30/2017    Allergies: No Known Allergies  Outpatient Meds: Current Facility-Administered Medications  Medication Dose Route Frequency Provider Last Rate Last Dose  . 0.9 %  sodium chloride infusion   Intravenous Continuous Danis, Estill Cotta III, MD          ___________________________________________________________________ Objective   Exam:  BP (!) 148/81   Pulse 91   Temp 97.9 F (36.6 C) (Oral)   Resp 15   Ht 6' (1.829 m)   SpO2 94%   BMI 29.29 kg/m   Chronically ill appearing man  CV: RRR without murmur, S1/S2, no JVD, no peripheral edema  Resp: clear to auscultation bilaterally, normal RR and effort noted  GI: soft, no tenderness, with active bowel sounds. No guarding or palpable organomegaly noted.  Neuro: awake, alert and oriented x 3. Normal gross motor function and fluent speech  Initial SpO2 85% on RA -> 95% on 2 liters/min [place] oxygen  Assessment:  LUQ pain, NV  Plan:  EGD   Nelida Meuse III

## 2017-12-05 NOTE — Progress Notes (Signed)
Patient discharged on home O2 @ 2L [place] that he was supposed to be wearing on admission.  Wife to take patient to dialysis center to be dialyzed this afternoon.  Vista Lawman, RN

## 2017-12-05 NOTE — Discharge Instructions (Signed)
YOU HAD AN ENDOSCOPIC PROCEDURE TODAY: Refer to the procedure report and other information in the discharge instructions given to you for any specific questions about what was found during the examination. If this information does not answer your questions, please call [place] office at [phone] to clarify.   YOU SHOULD EXPECT: Some feelings of bloating in the abdomen. Passage of more gas than usual. Walking can help get rid of the air that was put into your GI tract during the procedure and reduce the bloating. If you had a lower endoscopy (such as a colonoscopy or flexible sigmoidoscopy) you may notice spotting of blood in your stool or on the toilet paper. Some abdominal soreness may be present for a day or two, also.  DIET: Your first meal following the procedure should be a light meal and then it is ok to progress to your normal diet. A half-sandwich or bowl of soup is an example of a good first meal. Heavy or fried foods are harder to digest and may make you feel nauseous or bloated. Drink plenty of fluids but you should avoid alcoholic beverages for 24 hours. If you had a esophageal dilation, please see attached instructions for diet.    ACTIVITY: Your care partner should take you home directly after the procedure. You should plan to take it easy, moving slowly for the rest of the day. You can resume normal activity the day after the procedure however YOU SHOULD NOT DRIVE, use power tools, machinery or perform tasks that involve climbing or major physical exertion for 24 hours (because of the sedation medicines used during the test).   SYMPTOMS TO REPORT IMMEDIATELY: A gastroenterologist can be reached at any hour. Please call [phone]  for any of the following symptoms:   Following upper endoscopy (EGD, EUS, ERCP, esophageal dilation) Vomiting of blood or coffee ground material  New, significant abdominal pain  New, significant chest pain or pain under the shoulder blades  Painful or  persistently difficult swallowing  New shortness of breath  Black, tarry-looking or red, bloody stools  FOLLOW UP:  If any biopsies were taken you will be contacted by phone or by letter within the next 1-3 weeks. Call [phone]  if you have not heard about the biopsies in 3 weeks.  Please also call with any specific questions about appointments or follow up tests.  

## 2017-12-05 NOTE — Op Note (Addendum)
[facility] Patient Name: Howard Bell Procedure Date: 12/05/2017 MRN: [id number] Attending MD: Estill Cotta. Loletha Carrow , MD Date of Birth: Jul 28, 1965 CSN: [id number] Age: 52 Admit Type: Outpatient Procedure:                Upper GI endoscopy Indications:              Abdominal pain in the left upper quadrant, Nausea                            with vomiting (lately improved on metoclopramide) Providers:                Estill Cotta. Loletha Carrow, MD, Cleda Daub, RN, Tinnie Gens,                            Technician, Anne Fu CRNA, CRNA Referring MD:             Agustina Caroli, MD Medicines:                Monitored Anesthesia Care Complications:            No immediate complications. Estimated Blood Loss:     Estimated blood loss: none. Procedure:                Pre-Anesthesia Assessment:                           - Prior to the procedure, a History and Physical                            was performed, and patient medications and                            allergies were reviewed. The patient's tolerance of                            previous anesthesia was also reviewed. The risks                            and benefits of the procedure and the sedation                            options and risks were discussed with the patient.                            All questions were answered, and informed consent                            was obtained. Prior Anticoagulants: The patient has                            taken no previous anticoagulant or antiplatelet                            agents. ASA Grade Assessment: III - A patient with  severe systemic disease. After reviewing the risks                            and benefits, the patient was deemed in                            satisfactory condition to undergo the procedure.                           After obtaining informed consent, the endoscope was                            passed under  direct vision. Throughout the                            procedure, the patient's blood pressure, pulse, and                            oxygen saturations were monitored continuously. The                            GIF-H190 ([id number] Olympus adult endoscope was                            introduced through the mouth, and advanced to the                            second part of duodenum. The upper GI endoscopy was                            accomplished without difficulty. The patient                            tolerated the procedure. Due to technical problems,                            no photographs could be obtained. Scope In: Scope Out: Findings:      The esophagus was normal.      A large amount of bilious fluid with food debris was found in the       gastric fundus, in the gastric body and in the prepyloric region of the       stomach. It could not be completely cleared due to some solid food       component.      The cardia and gastric fundus were normal on retroflexion, given       limitation of retained material. The pylorus was patent.      The examined duodenum was normal. Impression:               - Normal esophagus.                           - Bilious gastric fluid.                           -  Normal examined duodenum.                           - No specimens collected.                           Findings confirm clinical suspicion of                            gastroparesis.                           LUQ pain and tenderness still appear to be                            abdominal wall pain from chronic cough that is                            lately improved after treated of pneumonia and CHF. Moderate Sedation:      MAC sedation used Recommendation:           - Patient has a contact number available for                            emergencies. The signs and symptoms of potential                            delayed complications were discussed with the                             patient. Return to normal activities tomorrow.                            Written discharge instructions were provided to the                            patient.                           - Resume previous diet.                           - Continue present medications. Increase                            metoclopramide dose if needed and if tolerated by                            QTc.                           - Optimal glucose control. Procedure Code(s):        --- Professional ---                           [phone], Esophagogastroduodenoscopy, flexible,  transoral; diagnostic, including collection of                            specimen(s) by brushing or washing, when performed                            (separate procedure) Diagnosis Code(s):        --- Professional ---                           R10.12, Left upper quadrant pain                           R11.2, Nausea with vomiting, unspecified CPT copyright 2017 American Medical Association. All rights reserved. The codes documented in this report are preliminary and upon coder review may  be revised to meet current compliance requirements. Ashiya Kinkead L. Loletha Carrow, MD 12/05/2017 9:50:34 AM This report has been signed electronically. Number of Addenda: 0

## 2017-12-05 NOTE — Anesthesia Preprocedure Evaluation (Signed)
Anesthesia Evaluation  Patient identified by MRN, date of birth, ID band Patient awake    Reviewed: Allergy & Precautions, NPO status , Patient's Chart, lab work & pertinent test results, reviewed documented beta blocker date and time   Airway Mallampati: II  TM Distance: >3 FB Neck ROM: Full    Dental no notable dental hx.    Pulmonary pneumonia,    Pulmonary exam normal breath sounds clear to auscultation       Cardiovascular hypertension, Pt. on home beta blockers and Pt. on medications +CHF  Normal cardiovascular exam Rhythm:Regular Rate:Normal     Neuro/Psych negative neurological ROS  negative psych ROS   GI/Hepatic negative GI ROS, Neg liver ROS,   Endo/Other  negative endocrine ROSdiabetes  Renal/GU ESRFRenal disease     Musculoskeletal negative musculoskeletal ROS (+)   Abdominal   Peds  Hematology negative hematology ROS (+) anemia ,   Anesthesia Other Findings   Reproductive/Obstetrics negative OB ROS                             Anesthesia Physical Anesthesia Plan  ASA: IV  Anesthesia Plan: MAC   Post-op Pain Management:    Induction: Intravenous  PONV Risk Score and Plan:   Airway Management Planned: Simple Face Mask  Additional Equipment:   Intra-op Plan:   Post-operative Plan:   Informed Consent: I have reviewed the patients History and Physical, chart, labs and discussed the procedure including the risks, benefits and alternatives for the proposed anesthesia with the patient or authorized representative who has indicated his/her understanding and acceptance.   Dental advisory given  Plan Discussed with: CRNA  Anesthesia Plan Comments:        Anesthesia Quick Evaluation

## 2017-12-05 NOTE — Interval H&P Note (Signed)
History and Physical Interval Note:  12/05/2017 9:18 AM  Howard Bell  has presented today for surgery, with the diagnosis of abdominal pain, vomiting  The various methods of treatment have been discussed with the patient and family. After consideration of risks, benefits and other options for treatment, the patient has consented to  Procedure(s): ESOPHAGOGASTRODUODENOSCOPY (EGD) WITH PROPOFOL (N/A) as a surgical intervention .  The patient's history has been reviewed, patient examined, no change in status, stable for surgery.  I have reviewed the patient's chart and labs.  Questions were answered to the patient's satisfaction.     Nelida Meuse III

## 2017-12-05 NOTE — Telephone Encounter (Signed)
Please review for refills.

## 2017-12-05 NOTE — Transfer of Care (Signed)
Immediate Anesthesia Transfer of Care Note  Patient: Howard Bell  Procedure(s) Performed: Procedure(s): ESOPHAGOGASTRODUODENOSCOPY (EGD) WITH PROPOFOL (N/A)  Patient Location: PACU  Anesthesia Type:MAC  Level of Consciousness:  sedated, patient cooperative and responds to stimulation  Airway & Oxygen Therapy:Patient Spontanous Breathing and Patient connected to face mask oxgen  Post-op Assessment:  Report given to PACU RN and Post -op Vital signs reviewed and stable  Post vital signs:  Reviewed and stable  Last Vitals:  Vitals:   12/05/17 1000 12/05/17 1010  BP: 125/62 134/66  Pulse: 86   Resp: 17 16  Temp:    SpO2: 68% 257%    Complications: No apparent anesthesia complications

## 2017-12-05 NOTE — Care Management Note (Signed)
Case Management Note  CM consulted for home O2 DME needs.  Pt had endoscopy procedure today and came without using his home O2.  Per Larene Beach, RN, pt's SpO2 was in the low 80s on arrival.  Pt needs to be transported to a dialysis at [facility] for scheduled dialysis at 11:15.  Pt's wife does not know the same of the [place].  Larene Beach reports pt was just transitioned home from SNF rehab at [facility] on 12/02/2017.  CM contacted [place] who reports [facility] in [place] is pt's provider.    CM contacted [facility] [employer] who reports pt's wife was just on the phone and a driver will be meeting her with the need parts.  CM contacted Larene Beach, RN to advise of the plan.  Pt will need to remain here while wife meets the DME driver for necessary parts and can then pick up pt to take to dialysis.  Pt's wife then reported the driver was not going to meet her immediately.  CM advised to have pt brought back in from the wife's car and transport via [facility] to [facility].  CM contacted [facility] to discuss late dialysis appointment.  The coordinator contacted Gerald Stabs at [facility] who advised the pt can arrive late but must be there prior to 2:00 pm.  If pt arrives after 2:00 pm dialysis will need to be rescheduled for tomorrow.  Rudi Heap, RN.  No further CM needs noted at this time.  Carin Shipp, Benjaman Lobe, RN 12/05/2017, 11:32 AM

## 2017-12-06 ENCOUNTER — Ambulatory Visit (INDEPENDENT_AMBULATORY_CARE_PROVIDER_SITE_OTHER): Payer: Medicare Other | Admitting: Pulmonary Disease

## 2017-12-06 ENCOUNTER — Encounter (HOSPITAL_COMMUNITY): Payer: Self-pay | Admitting: Gastroenterology

## 2017-12-06 ENCOUNTER — Ambulatory Visit (INDEPENDENT_AMBULATORY_CARE_PROVIDER_SITE_OTHER)
Admission: RE | Admit: 2017-12-06 | Discharge: 2017-12-06 | Disposition: A | Discharge status: Home / Self Care | Payer: Medicare Other | Source: Ambulatory Visit | Attending: Pulmonary Disease | Admitting: Pulmonary Disease

## 2017-12-06 VITALS — BP 126/84 | HR 100 | Ht 72.0 in | Wt 207.6 lb

## 2017-12-06 DIAGNOSIS — R0602 Shortness of breath: Secondary | ICD-10-CM

## 2017-12-06 DIAGNOSIS — J9601 Acute respiratory failure with hypoxia: Secondary | ICD-10-CM

## 2017-12-06 DIAGNOSIS — J189 Pneumonia, unspecified organism: Secondary | ICD-10-CM | POA: Diagnosis not present

## 2017-12-06 NOTE — Assessment & Plan Note (Signed)
Okay to stay off oxygen at rest, continue to use during ambulation We will try to prescribe you.  Thanks.  Reassess in 1 month

## 2017-12-06 NOTE — Progress Notes (Signed)
Subjective:    Patient ID: Howard Bell, male    DOB: 1965-12-01, 52 y.o.   MRN: [id number]  HPI   Chief Complaint  Patient presents with  . Pulm Consult    Referred by [facility] for possible PNA. Patient is currently on O2. Has a small tank but it is constantly running out. On 2L.     52 year old hypertensive diabetic with end-stage renal disease on dialysis Monday/Wednesday/Friday, was hospitalized for pneumonia from 8/18 to 8/23.  He presented with nausea/vomiting and shortness of breath.  Chest x-ray showed right lower lobe pneumonia which was confirmed on CT chest personally reviewed films from 8/21.  Speech evaluation suggested aspiration.  He had a long rehab stay for 21 days and is now discharged home on oxygen 2 L. Wife complains that oxygen tank is very small and only last for about an hour, DME was [facility] from [place]. 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  He gets dialyzed via left arm AV fistula.  Over the past few days he feels improved and denies dyspnea on exertion or cough or fevers.   Oxygen saturation was 91% on room air and dropped to 86% on ambulation and then recovered with oxygen  Past Medical History:  Diagnosis Date  . Congestive heart failure (CHF) ([place])   . 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 Diabetic gastroparesis ([place]) 11/06/2017  . ESRD (end stage renal disease) on dialysis ([place])   . Hypertension    Past Surgical History:  Procedure Laterality Date  . ESOPHAGOGASTRODUODENOSCOPY (EGD) WITH PROPOFOL N/A 12/05/2017   Procedure: ESOPHAGOGASTRODUODENOSCOPY (EGD) WITH PROPOFOL;  Surgeon: Doran Stabler, MD;  Location: [facility] ENDOSCOPY;  Service: Gastroenterology;  Laterality: N/A;  . HERNIA REPAIR    . IR AV DIALY SHUNT INTRO NEEDLE/INTRACATH INITIAL W/PTA/IMG LEFT   03/30/2017    No Known Allergies  Social History   Socioeconomic History  . Marital status: Married    Spouse name: Not on file  . Number of children: Not on file  . Years of education: Not on file  . Highest education level: Not on file  Occupational History  . Not on file  Social Needs  . Financial resource strain: Not on file  . Food insecurity:    Worry: Not on file    Inability: Not on file  . Transportation needs:    Medical: Not on file    Non-medical: Not on file  Tobacco Use  . Smoking status: Never Smoker  . Smokeless tobacco: Never Used  Substance and Sexual Activity  . Alcohol use: No  . Drug use: No  . Sexual activity: Not on file  Lifestyle  . Physical activity:    Days per week: Not on file    Minutes per session: Not on file  . Stress: Not on file  Relationships  . Social connections:    Talks on phone: Not on file    Gets together: Not on file    Attends religious service: Not on file    Active member of club or organization: Not on file    Attends meetings of clubs or organizations: Not on file    Relationship status: Not on file  . Intimate partner violence:    Fear of current or ex partner: Not on file    Emotionally abused: Not on  file    Physically abused: Not on file    Forced sexual activity: Not on file  Other Topics Concern  . Not on file  Social History Narrative  . Not on file      Family History  Problem Relation Age of Onset  . Diabetes Mother   . Hypertension Father        Review of Systems    Constitutional: negative for anorexia, fevers and sweats  Eyes: negative for irritation, redness and visual disturbance  Ears, nose, mouth, throat, and face: negative for earaches, epistaxis, nasal congestion and sore throat  Respiratory: negative for  sputum and wheezing  Cardiovascular: negative for chest pain, dyspnea, lower extremity edema, orthopnea, palpitations and syncope  Gastrointestinal: negative for abdominal pain,  constipation, diarrhea, melena, nausea and vomiting  Genitourinary:negative for dysuria, frequency and hematuria  Hematologic/lymphatic: negative for bleeding, easy bruising and lymphadenopathy  Musculoskeletal:negative for arthralgias, muscle weakness and stiff joints  Neurological: negative for coordination problems, gait problems, headaches and weakness  Endocrine: negative for diabetic symptoms including polydipsia, polyuria and weight loss     Objective:   Physical Exam   Gen. Pleasant, well-nourished, in no distress, normal affect ENT - no thrush, no post nasal drip Neck: No JVD, no thyromegaly, no carotid bruits Lungs: no use of accessory muscles, no dullness to percussion, clear without rales or rhonchi  Cardiovascular: Rhythm regular, heart sounds  normal, no murmurs or gallops, no peripheral edema Abdomen: soft and non-tender, no hepatosplenomegaly, BS normal. Musculoskeletal: No deformities, no cyanosis or clubbing, LUE AVF - thrill Neuro:  alert, non focal        Assessment & Plan:

## 2017-12-06 NOTE — Assessment & Plan Note (Signed)
Appears to be on the basis of aspiration due to gastroparesis. Repeat chest x-ray today for resolution

## 2017-12-06 NOTE — Patient Instructions (Addendum)
Chest x-ray today to follow-up on resolution of pneumonia  Okay to stay off oxygen at rest, continue to use during ambulation We will try to prescribe you.  Thanks.  Reassess in 1 month

## 2017-12-07 DIAGNOSIS — D509 Iron deficiency anemia, unspecified: Secondary | ICD-10-CM | POA: Diagnosis not present

## 2017-12-07 DIAGNOSIS — Z23 Encounter for immunization: Secondary | ICD-10-CM | POA: Diagnosis not present

## 2017-12-07 DIAGNOSIS — E1129 Type 2 diabetes mellitus with other diabetic kidney complication: Secondary | ICD-10-CM | POA: Diagnosis not present

## 2017-12-07 DIAGNOSIS — N2581 Secondary hyperparathyroidism of renal origin: Secondary | ICD-10-CM | POA: Diagnosis not present

## 2017-12-07 DIAGNOSIS — N186 End stage renal disease: Secondary | ICD-10-CM | POA: Diagnosis not present

## 2017-12-07 NOTE — Anesthesia Postprocedure Evaluation (Signed)
Anesthesia Post Note  Patient: Howard Bell  Procedure(s) Performed: ESOPHAGOGASTRODUODENOSCOPY (EGD) WITH PROPOFOL (N/A )     Patient location during evaluation: PACU Anesthesia Type: MAC Level of consciousness: awake and alert Pain management: pain level controlled Vital Signs Assessment: post-procedure vital signs reviewed and stable Respiratory status: spontaneous breathing Cardiovascular status: stable Anesthetic complications: no    Last Vitals:  Vitals:   12/05/17 1000 12/05/17 1010  BP: 125/62 134/66  Pulse: 86   Resp: 17 16  Temp:    SpO2: 98% 100%    Last Pain:  Vitals:   12/05/17 0948  TempSrc:   PainSc: 0-No pain                 Nolon Nations

## 2017-12-09 ENCOUNTER — Telehealth: Payer: Self-pay | Admitting: Emergency Medicine

## 2017-12-09 DIAGNOSIS — D509 Iron deficiency anemia, unspecified: Secondary | ICD-10-CM | POA: Diagnosis not present

## 2017-12-09 DIAGNOSIS — Z23 Encounter for immunization: Secondary | ICD-10-CM | POA: Diagnosis not present

## 2017-12-09 DIAGNOSIS — E1129 Type 2 diabetes mellitus with other diabetic kidney complication: Secondary | ICD-10-CM | POA: Diagnosis not present

## 2017-12-09 DIAGNOSIS — N2581 Secondary hyperparathyroidism of renal origin: Secondary | ICD-10-CM | POA: Diagnosis not present

## 2017-12-09 DIAGNOSIS — N186 End stage renal disease: Secondary | ICD-10-CM | POA: Diagnosis not present

## 2017-12-09 NOTE — Telephone Encounter (Signed)
Copied from [place] [phone]. Topic: General - Other >> Dec 09, 2017 12:33 PM Leward Quan A wrote: Reason for CRM: Jenny Reichmann with [facility] called to inform that they will start Home care services with patient tomorrow 12/10/17. But also that he will be in for his appointment next week.

## 2017-12-12 ENCOUNTER — Inpatient Hospital Stay: Payer: Medicare Other | Admitting: Emergency Medicine

## 2017-12-12 DIAGNOSIS — N2581 Secondary hyperparathyroidism of renal origin: Secondary | ICD-10-CM | POA: Diagnosis not present

## 2017-12-12 DIAGNOSIS — E1129 Type 2 diabetes mellitus with other diabetic kidney complication: Secondary | ICD-10-CM | POA: Diagnosis not present

## 2017-12-12 DIAGNOSIS — D509 Iron deficiency anemia, unspecified: Secondary | ICD-10-CM | POA: Diagnosis not present

## 2017-12-12 DIAGNOSIS — N186 End stage renal disease: Secondary | ICD-10-CM | POA: Diagnosis not present

## 2017-12-12 DIAGNOSIS — Z23 Encounter for immunization: Secondary | ICD-10-CM | POA: Diagnosis not present

## 2017-12-13 ENCOUNTER — Telehealth: Payer: Self-pay | Admitting: Emergency Medicine

## 2017-12-13 NOTE — Telephone Encounter (Signed)
Copied from [place] [phone]. Topic: General - Other >> Dec 13, 2017 12:24 PM Yvette Rack wrote: Reason for CRM: pt calling stating that someone called him about results but no documentation he doesn't know what results

## 2017-12-14 DIAGNOSIS — N2581 Secondary hyperparathyroidism of renal origin: Secondary | ICD-10-CM | POA: Diagnosis not present

## 2017-12-14 DIAGNOSIS — Z23 Encounter for immunization: Secondary | ICD-10-CM | POA: Diagnosis not present

## 2017-12-14 DIAGNOSIS — N186 End stage renal disease: Secondary | ICD-10-CM | POA: Diagnosis not present

## 2017-12-14 DIAGNOSIS — E1129 Type 2 diabetes mellitus with other diabetic kidney complication: Secondary | ICD-10-CM | POA: Diagnosis not present

## 2017-12-14 DIAGNOSIS — D509 Iron deficiency anemia, unspecified: Secondary | ICD-10-CM | POA: Diagnosis not present

## 2017-12-16 DIAGNOSIS — Z23 Encounter for immunization: Secondary | ICD-10-CM | POA: Diagnosis not present

## 2017-12-16 DIAGNOSIS — E1129 Type 2 diabetes mellitus with other diabetic kidney complication: Secondary | ICD-10-CM | POA: Diagnosis not present

## 2017-12-16 DIAGNOSIS — N186 End stage renal disease: Secondary | ICD-10-CM | POA: Diagnosis not present

## 2017-12-16 DIAGNOSIS — D509 Iron deficiency anemia, unspecified: Secondary | ICD-10-CM | POA: Diagnosis not present

## 2017-12-16 DIAGNOSIS — N2581 Secondary hyperparathyroidism of renal origin: Secondary | ICD-10-CM | POA: Diagnosis not present

## 2017-12-19 DIAGNOSIS — N2581 Secondary hyperparathyroidism of renal origin: Secondary | ICD-10-CM | POA: Diagnosis not present

## 2017-12-19 DIAGNOSIS — E1129 Type 2 diabetes mellitus with other diabetic kidney complication: Secondary | ICD-10-CM | POA: Diagnosis not present

## 2017-12-19 DIAGNOSIS — D509 Iron deficiency anemia, unspecified: Secondary | ICD-10-CM | POA: Diagnosis not present

## 2017-12-19 DIAGNOSIS — Z23 Encounter for immunization: Secondary | ICD-10-CM | POA: Diagnosis not present

## 2017-12-19 DIAGNOSIS — N186 End stage renal disease: Secondary | ICD-10-CM | POA: Diagnosis not present

## 2017-12-20 DIAGNOSIS — N186 End stage renal disease: Secondary | ICD-10-CM | POA: Diagnosis not present

## 2017-12-20 DIAGNOSIS — Z992 Dependence on renal dialysis: Secondary | ICD-10-CM | POA: Diagnosis not present

## 2017-12-20 DIAGNOSIS — E1129 Type 2 diabetes mellitus with other diabetic kidney complication: Secondary | ICD-10-CM | POA: Diagnosis not present

## 2017-12-21 ENCOUNTER — Telehealth: Payer: Self-pay | Admitting: Adult Health

## 2017-12-21 DIAGNOSIS — E1129 Type 2 diabetes mellitus with other diabetic kidney complication: Secondary | ICD-10-CM | POA: Diagnosis not present

## 2017-12-21 DIAGNOSIS — N2581 Secondary hyperparathyroidism of renal origin: Secondary | ICD-10-CM | POA: Diagnosis not present

## 2017-12-21 DIAGNOSIS — N186 End stage renal disease: Secondary | ICD-10-CM | POA: Diagnosis not present

## 2017-12-21 NOTE — Telephone Encounter (Signed)
Called and spoke with patient's wife because they received a letter stating results were ready.   Was able to talk to patient's wife regarding patient's chest xray results.  They verbalized an understanding of what was discussed. No further questions at this time.

## 2017-12-23 DIAGNOSIS — N2581 Secondary hyperparathyroidism of renal origin: Secondary | ICD-10-CM | POA: Diagnosis not present

## 2017-12-23 DIAGNOSIS — E1129 Type 2 diabetes mellitus with other diabetic kidney complication: Secondary | ICD-10-CM | POA: Diagnosis not present

## 2017-12-23 DIAGNOSIS — N186 End stage renal disease: Secondary | ICD-10-CM | POA: Diagnosis not present

## 2017-12-26 DIAGNOSIS — N186 End stage renal disease: Secondary | ICD-10-CM | POA: Diagnosis not present

## 2017-12-26 DIAGNOSIS — E1129 Type 2 diabetes mellitus with other diabetic kidney complication: Secondary | ICD-10-CM | POA: Diagnosis not present

## 2017-12-26 DIAGNOSIS — N2581 Secondary hyperparathyroidism of renal origin: Secondary | ICD-10-CM | POA: Diagnosis not present

## 2017-12-28 DIAGNOSIS — N186 End stage renal disease: Secondary | ICD-10-CM | POA: Diagnosis not present

## 2017-12-28 DIAGNOSIS — E1129 Type 2 diabetes mellitus with other diabetic kidney complication: Secondary | ICD-10-CM | POA: Diagnosis not present

## 2017-12-28 DIAGNOSIS — N2581 Secondary hyperparathyroidism of renal origin: Secondary | ICD-10-CM | POA: Diagnosis not present

## 2017-12-30 DIAGNOSIS — N2581 Secondary hyperparathyroidism of renal origin: Secondary | ICD-10-CM | POA: Diagnosis not present

## 2017-12-30 DIAGNOSIS — N186 End stage renal disease: Secondary | ICD-10-CM | POA: Diagnosis not present

## 2017-12-30 DIAGNOSIS — E1129 Type 2 diabetes mellitus with other diabetic kidney complication: Secondary | ICD-10-CM | POA: Diagnosis not present

## 2018-01-02 DIAGNOSIS — N186 End stage renal disease: Secondary | ICD-10-CM | POA: Diagnosis not present

## 2018-01-02 DIAGNOSIS — E1129 Type 2 diabetes mellitus with other diabetic kidney complication: Secondary | ICD-10-CM | POA: Diagnosis not present

## 2018-01-02 DIAGNOSIS — N2581 Secondary hyperparathyroidism of renal origin: Secondary | ICD-10-CM | POA: Diagnosis not present

## 2018-01-03 ENCOUNTER — Ambulatory Visit: Payer: Medicare Other | Admitting: Adult Health

## 2018-01-04 DIAGNOSIS — N2581 Secondary hyperparathyroidism of renal origin: Secondary | ICD-10-CM | POA: Diagnosis not present

## 2018-01-04 DIAGNOSIS — E1129 Type 2 diabetes mellitus with other diabetic kidney complication: Secondary | ICD-10-CM | POA: Diagnosis not present

## 2018-01-04 DIAGNOSIS — N186 End stage renal disease: Secondary | ICD-10-CM | POA: Diagnosis not present

## 2018-01-06 DIAGNOSIS — N186 End stage renal disease: Secondary | ICD-10-CM | POA: Diagnosis not present

## 2018-01-06 DIAGNOSIS — E1129 Type 2 diabetes mellitus with other diabetic kidney complication: Secondary | ICD-10-CM | POA: Diagnosis not present

## 2018-01-06 DIAGNOSIS — N2581 Secondary hyperparathyroidism of renal origin: Secondary | ICD-10-CM | POA: Diagnosis not present

## 2018-01-09 DIAGNOSIS — E1129 Type 2 diabetes mellitus with other diabetic kidney complication: Secondary | ICD-10-CM | POA: Diagnosis not present

## 2018-01-09 DIAGNOSIS — N186 End stage renal disease: Secondary | ICD-10-CM | POA: Diagnosis not present

## 2018-01-09 DIAGNOSIS — N2581 Secondary hyperparathyroidism of renal origin: Secondary | ICD-10-CM | POA: Diagnosis not present

## 2018-01-11 DIAGNOSIS — N186 End stage renal disease: Secondary | ICD-10-CM | POA: Diagnosis not present

## 2018-01-11 DIAGNOSIS — E1129 Type 2 diabetes mellitus with other diabetic kidney complication: Secondary | ICD-10-CM | POA: Diagnosis not present

## 2018-01-11 DIAGNOSIS — N2581 Secondary hyperparathyroidism of renal origin: Secondary | ICD-10-CM | POA: Diagnosis not present

## 2018-01-13 ENCOUNTER — Telehealth: Payer: Self-pay | Admitting: Emergency Medicine

## 2018-01-13 DIAGNOSIS — N2581 Secondary hyperparathyroidism of renal origin: Secondary | ICD-10-CM | POA: Diagnosis not present

## 2018-01-13 DIAGNOSIS — N186 End stage renal disease: Secondary | ICD-10-CM | POA: Diagnosis not present

## 2018-01-13 DIAGNOSIS — E1129 Type 2 diabetes mellitus with other diabetic kidney complication: Secondary | ICD-10-CM | POA: Diagnosis not present

## 2018-01-13 NOTE — Telephone Encounter (Signed)
Left a VM in regards to his message about wanting to make an appt because of arm pain.

## 2018-01-16 DIAGNOSIS — N186 End stage renal disease: Secondary | ICD-10-CM | POA: Diagnosis not present

## 2018-01-16 DIAGNOSIS — E1129 Type 2 diabetes mellitus with other diabetic kidney complication: Secondary | ICD-10-CM | POA: Diagnosis not present

## 2018-01-16 DIAGNOSIS — N2581 Secondary hyperparathyroidism of renal origin: Secondary | ICD-10-CM | POA: Diagnosis not present

## 2018-01-18 DIAGNOSIS — E1129 Type 2 diabetes mellitus with other diabetic kidney complication: Secondary | ICD-10-CM | POA: Diagnosis not present

## 2018-01-18 DIAGNOSIS — N186 End stage renal disease: Secondary | ICD-10-CM | POA: Diagnosis not present

## 2018-01-18 DIAGNOSIS — N2581 Secondary hyperparathyroidism of renal origin: Secondary | ICD-10-CM | POA: Diagnosis not present

## 2018-01-20 DIAGNOSIS — N186 End stage renal disease: Secondary | ICD-10-CM | POA: Diagnosis not present

## 2018-01-20 DIAGNOSIS — Z992 Dependence on renal dialysis: Secondary | ICD-10-CM | POA: Diagnosis not present

## 2018-01-20 DIAGNOSIS — N2581 Secondary hyperparathyroidism of renal origin: Secondary | ICD-10-CM | POA: Diagnosis not present

## 2018-01-20 DIAGNOSIS — E1129 Type 2 diabetes mellitus with other diabetic kidney complication: Secondary | ICD-10-CM | POA: Diagnosis not present

## 2018-01-23 DIAGNOSIS — N2581 Secondary hyperparathyroidism of renal origin: Secondary | ICD-10-CM | POA: Diagnosis not present

## 2018-01-23 DIAGNOSIS — E1129 Type 2 diabetes mellitus with other diabetic kidney complication: Secondary | ICD-10-CM | POA: Diagnosis not present

## 2018-01-23 DIAGNOSIS — N186 End stage renal disease: Secondary | ICD-10-CM | POA: Diagnosis not present

## 2018-01-25 DIAGNOSIS — N186 End stage renal disease: Secondary | ICD-10-CM | POA: Diagnosis not present

## 2018-01-25 DIAGNOSIS — E1129 Type 2 diabetes mellitus with other diabetic kidney complication: Secondary | ICD-10-CM | POA: Diagnosis not present

## 2018-01-25 DIAGNOSIS — N2581 Secondary hyperparathyroidism of renal origin: Secondary | ICD-10-CM | POA: Diagnosis not present

## 2018-01-27 DIAGNOSIS — N2581 Secondary hyperparathyroidism of renal origin: Secondary | ICD-10-CM | POA: Diagnosis not present

## 2018-01-27 DIAGNOSIS — E1129 Type 2 diabetes mellitus with other diabetic kidney complication: Secondary | ICD-10-CM | POA: Diagnosis not present

## 2018-01-27 DIAGNOSIS — N186 End stage renal disease: Secondary | ICD-10-CM | POA: Diagnosis not present

## 2018-01-30 DIAGNOSIS — N2581 Secondary hyperparathyroidism of renal origin: Secondary | ICD-10-CM | POA: Diagnosis not present

## 2018-01-30 DIAGNOSIS — E1129 Type 2 diabetes mellitus with other diabetic kidney complication: Secondary | ICD-10-CM | POA: Diagnosis not present

## 2018-01-30 DIAGNOSIS — N186 End stage renal disease: Secondary | ICD-10-CM | POA: Diagnosis not present

## 2018-02-01 DIAGNOSIS — E1129 Type 2 diabetes mellitus with other diabetic kidney complication: Secondary | ICD-10-CM | POA: Diagnosis not present

## 2018-02-01 DIAGNOSIS — N2581 Secondary hyperparathyroidism of renal origin: Secondary | ICD-10-CM | POA: Diagnosis not present

## 2018-02-01 DIAGNOSIS — N186 End stage renal disease: Secondary | ICD-10-CM | POA: Diagnosis not present

## 2018-02-03 DIAGNOSIS — N186 End stage renal disease: Secondary | ICD-10-CM | POA: Diagnosis not present

## 2018-02-03 DIAGNOSIS — E1129 Type 2 diabetes mellitus with other diabetic kidney complication: Secondary | ICD-10-CM | POA: Diagnosis not present

## 2018-02-03 DIAGNOSIS — N2581 Secondary hyperparathyroidism of renal origin: Secondary | ICD-10-CM | POA: Diagnosis not present

## 2018-02-06 DIAGNOSIS — E1129 Type 2 diabetes mellitus with other diabetic kidney complication: Secondary | ICD-10-CM | POA: Diagnosis not present

## 2018-02-06 DIAGNOSIS — N186 End stage renal disease: Secondary | ICD-10-CM | POA: Diagnosis not present

## 2018-02-06 DIAGNOSIS — N2581 Secondary hyperparathyroidism of renal origin: Secondary | ICD-10-CM | POA: Diagnosis not present

## 2018-02-08 DIAGNOSIS — N2581 Secondary hyperparathyroidism of renal origin: Secondary | ICD-10-CM | POA: Diagnosis not present

## 2018-02-08 DIAGNOSIS — E1129 Type 2 diabetes mellitus with other diabetic kidney complication: Secondary | ICD-10-CM | POA: Diagnosis not present

## 2018-02-08 DIAGNOSIS — N186 End stage renal disease: Secondary | ICD-10-CM | POA: Diagnosis not present

## 2018-02-10 DIAGNOSIS — E1129 Type 2 diabetes mellitus with other diabetic kidney complication: Secondary | ICD-10-CM | POA: Diagnosis not present

## 2018-02-10 DIAGNOSIS — N2581 Secondary hyperparathyroidism of renal origin: Secondary | ICD-10-CM | POA: Diagnosis not present

## 2018-02-10 DIAGNOSIS — N186 End stage renal disease: Secondary | ICD-10-CM | POA: Diagnosis not present

## 2018-02-12 DIAGNOSIS — N186 End stage renal disease: Secondary | ICD-10-CM | POA: Diagnosis not present

## 2018-02-12 DIAGNOSIS — E1129 Type 2 diabetes mellitus with other diabetic kidney complication: Secondary | ICD-10-CM | POA: Diagnosis not present

## 2018-02-12 DIAGNOSIS — N2581 Secondary hyperparathyroidism of renal origin: Secondary | ICD-10-CM | POA: Diagnosis not present

## 2018-02-14 DIAGNOSIS — N186 End stage renal disease: Secondary | ICD-10-CM | POA: Diagnosis not present

## 2018-02-14 DIAGNOSIS — E1129 Type 2 diabetes mellitus with other diabetic kidney complication: Secondary | ICD-10-CM | POA: Diagnosis not present

## 2018-02-14 DIAGNOSIS — N2581 Secondary hyperparathyroidism of renal origin: Secondary | ICD-10-CM | POA: Diagnosis not present

## 2018-02-18 DIAGNOSIS — N186 End stage renal disease: Secondary | ICD-10-CM | POA: Diagnosis not present

## 2018-02-18 DIAGNOSIS — N2581 Secondary hyperparathyroidism of renal origin: Secondary | ICD-10-CM | POA: Diagnosis not present

## 2018-02-19 DIAGNOSIS — Z992 Dependence on renal dialysis: Secondary | ICD-10-CM | POA: Diagnosis not present

## 2018-02-19 DIAGNOSIS — N186 End stage renal disease: Secondary | ICD-10-CM | POA: Diagnosis not present

## 2018-02-19 DIAGNOSIS — E1129 Type 2 diabetes mellitus with other diabetic kidney complication: Secondary | ICD-10-CM | POA: Diagnosis not present

## 2018-02-20 DIAGNOSIS — E1129 Type 2 diabetes mellitus with other diabetic kidney complication: Secondary | ICD-10-CM | POA: Diagnosis not present

## 2018-02-20 DIAGNOSIS — N186 End stage renal disease: Secondary | ICD-10-CM | POA: Diagnosis not present

## 2018-02-20 DIAGNOSIS — N2581 Secondary hyperparathyroidism of renal origin: Secondary | ICD-10-CM | POA: Diagnosis not present

## 2018-02-22 DIAGNOSIS — N186 End stage renal disease: Secondary | ICD-10-CM | POA: Diagnosis not present

## 2018-02-22 DIAGNOSIS — N2581 Secondary hyperparathyroidism of renal origin: Secondary | ICD-10-CM | POA: Diagnosis not present

## 2018-02-22 DIAGNOSIS — E1129 Type 2 diabetes mellitus with other diabetic kidney complication: Secondary | ICD-10-CM | POA: Diagnosis not present

## 2018-02-24 DIAGNOSIS — N186 End stage renal disease: Secondary | ICD-10-CM | POA: Diagnosis not present

## 2018-02-24 DIAGNOSIS — E1129 Type 2 diabetes mellitus with other diabetic kidney complication: Secondary | ICD-10-CM | POA: Diagnosis not present

## 2018-02-24 DIAGNOSIS — N2581 Secondary hyperparathyroidism of renal origin: Secondary | ICD-10-CM | POA: Diagnosis not present

## 2018-02-27 DIAGNOSIS — N2581 Secondary hyperparathyroidism of renal origin: Secondary | ICD-10-CM | POA: Diagnosis not present

## 2018-02-27 DIAGNOSIS — N186 End stage renal disease: Secondary | ICD-10-CM | POA: Diagnosis not present

## 2018-02-27 DIAGNOSIS — E1129 Type 2 diabetes mellitus with other diabetic kidney complication: Secondary | ICD-10-CM | POA: Diagnosis not present

## 2018-03-01 DIAGNOSIS — E1129 Type 2 diabetes mellitus with other diabetic kidney complication: Secondary | ICD-10-CM | POA: Diagnosis not present

## 2018-03-01 DIAGNOSIS — N186 End stage renal disease: Secondary | ICD-10-CM | POA: Diagnosis not present

## 2018-03-01 DIAGNOSIS — N2581 Secondary hyperparathyroidism of renal origin: Secondary | ICD-10-CM | POA: Diagnosis not present

## 2018-03-03 DIAGNOSIS — N2581 Secondary hyperparathyroidism of renal origin: Secondary | ICD-10-CM | POA: Diagnosis not present

## 2018-03-03 DIAGNOSIS — N186 End stage renal disease: Secondary | ICD-10-CM | POA: Diagnosis not present

## 2018-03-03 DIAGNOSIS — E1129 Type 2 diabetes mellitus with other diabetic kidney complication: Secondary | ICD-10-CM | POA: Diagnosis not present

## 2018-03-06 DIAGNOSIS — N2581 Secondary hyperparathyroidism of renal origin: Secondary | ICD-10-CM | POA: Diagnosis not present

## 2018-03-06 DIAGNOSIS — N186 End stage renal disease: Secondary | ICD-10-CM | POA: Diagnosis not present

## 2018-03-06 DIAGNOSIS — E1129 Type 2 diabetes mellitus with other diabetic kidney complication: Secondary | ICD-10-CM | POA: Diagnosis not present

## 2018-03-08 DIAGNOSIS — E1129 Type 2 diabetes mellitus with other diabetic kidney complication: Secondary | ICD-10-CM | POA: Diagnosis not present

## 2018-03-08 DIAGNOSIS — N2581 Secondary hyperparathyroidism of renal origin: Secondary | ICD-10-CM | POA: Diagnosis not present

## 2018-03-08 DIAGNOSIS — N186 End stage renal disease: Secondary | ICD-10-CM | POA: Diagnosis not present

## 2018-03-10 DIAGNOSIS — N186 End stage renal disease: Secondary | ICD-10-CM | POA: Diagnosis not present

## 2018-03-10 DIAGNOSIS — N2581 Secondary hyperparathyroidism of renal origin: Secondary | ICD-10-CM | POA: Diagnosis not present

## 2018-03-10 DIAGNOSIS — E1129 Type 2 diabetes mellitus with other diabetic kidney complication: Secondary | ICD-10-CM | POA: Diagnosis not present

## 2018-03-12 DIAGNOSIS — N186 End stage renal disease: Secondary | ICD-10-CM | POA: Diagnosis not present

## 2018-03-12 DIAGNOSIS — N2581 Secondary hyperparathyroidism of renal origin: Secondary | ICD-10-CM | POA: Diagnosis not present

## 2018-03-12 DIAGNOSIS — E1129 Type 2 diabetes mellitus with other diabetic kidney complication: Secondary | ICD-10-CM | POA: Diagnosis not present

## 2018-03-13 DIAGNOSIS — N2581 Secondary hyperparathyroidism of renal origin: Secondary | ICD-10-CM | POA: Diagnosis not present

## 2018-03-13 DIAGNOSIS — E1129 Type 2 diabetes mellitus with other diabetic kidney complication: Secondary | ICD-10-CM | POA: Diagnosis not present

## 2018-03-13 DIAGNOSIS — N186 End stage renal disease: Secondary | ICD-10-CM | POA: Diagnosis not present

## 2018-03-17 DIAGNOSIS — E1129 Type 2 diabetes mellitus with other diabetic kidney complication: Secondary | ICD-10-CM | POA: Diagnosis not present

## 2018-03-17 DIAGNOSIS — N186 End stage renal disease: Secondary | ICD-10-CM | POA: Diagnosis not present

## 2018-03-17 DIAGNOSIS — N2581 Secondary hyperparathyroidism of renal origin: Secondary | ICD-10-CM | POA: Diagnosis not present

## 2018-03-19 DIAGNOSIS — N186 End stage renal disease: Secondary | ICD-10-CM | POA: Diagnosis not present

## 2018-03-19 DIAGNOSIS — N2581 Secondary hyperparathyroidism of renal origin: Secondary | ICD-10-CM | POA: Diagnosis not present

## 2018-03-19 DIAGNOSIS — E1129 Type 2 diabetes mellitus with other diabetic kidney complication: Secondary | ICD-10-CM | POA: Diagnosis not present

## 2018-03-21 DIAGNOSIS — N186 End stage renal disease: Secondary | ICD-10-CM | POA: Diagnosis not present

## 2018-03-21 DIAGNOSIS — E1129 Type 2 diabetes mellitus with other diabetic kidney complication: Secondary | ICD-10-CM | POA: Diagnosis not present

## 2018-03-21 DIAGNOSIS — N2581 Secondary hyperparathyroidism of renal origin: Secondary | ICD-10-CM | POA: Diagnosis not present

## 2018-03-22 DIAGNOSIS — Z992 Dependence on renal dialysis: Secondary | ICD-10-CM | POA: Diagnosis not present

## 2018-03-22 DIAGNOSIS — N186 End stage renal disease: Secondary | ICD-10-CM | POA: Diagnosis not present

## 2018-03-22 DIAGNOSIS — E1129 Type 2 diabetes mellitus with other diabetic kidney complication: Secondary | ICD-10-CM | POA: Diagnosis not present

## 2018-03-24 DIAGNOSIS — E1129 Type 2 diabetes mellitus with other diabetic kidney complication: Secondary | ICD-10-CM | POA: Diagnosis not present

## 2018-03-24 DIAGNOSIS — N186 End stage renal disease: Secondary | ICD-10-CM | POA: Diagnosis not present

## 2018-03-24 DIAGNOSIS — N2581 Secondary hyperparathyroidism of renal origin: Secondary | ICD-10-CM | POA: Diagnosis not present

## 2018-03-27 DIAGNOSIS — N2581 Secondary hyperparathyroidism of renal origin: Secondary | ICD-10-CM | POA: Diagnosis not present

## 2018-03-27 DIAGNOSIS — E1129 Type 2 diabetes mellitus with other diabetic kidney complication: Secondary | ICD-10-CM | POA: Diagnosis not present

## 2018-03-27 DIAGNOSIS — N186 End stage renal disease: Secondary | ICD-10-CM | POA: Diagnosis not present

## 2018-03-29 DIAGNOSIS — E1129 Type 2 diabetes mellitus with other diabetic kidney complication: Secondary | ICD-10-CM | POA: Diagnosis not present

## 2018-03-29 DIAGNOSIS — N2581 Secondary hyperparathyroidism of renal origin: Secondary | ICD-10-CM | POA: Diagnosis not present

## 2018-03-29 DIAGNOSIS — N186 End stage renal disease: Secondary | ICD-10-CM | POA: Diagnosis not present

## 2018-03-31 DIAGNOSIS — N186 End stage renal disease: Secondary | ICD-10-CM | POA: Diagnosis not present

## 2018-03-31 DIAGNOSIS — E1129 Type 2 diabetes mellitus with other diabetic kidney complication: Secondary | ICD-10-CM | POA: Diagnosis not present

## 2018-03-31 DIAGNOSIS — N2581 Secondary hyperparathyroidism of renal origin: Secondary | ICD-10-CM | POA: Diagnosis not present

## 2018-04-01 ENCOUNTER — Ambulatory Visit (INDEPENDENT_AMBULATORY_CARE_PROVIDER_SITE_OTHER): Payer: Medicare Other | Admitting: Physician Assistant

## 2018-04-01 ENCOUNTER — Encounter: Payer: Self-pay | Admitting: Physician Assistant

## 2018-04-01 ENCOUNTER — Other Ambulatory Visit: Payer: Self-pay

## 2018-04-01 VITALS — BP 117/80 | HR 70 | Temp 98.4°F | Resp 16 | Ht 72.0 in | Wt 218.2 lb

## 2018-04-01 DIAGNOSIS — M25511 Pain in right shoulder: Secondary | ICD-10-CM | POA: Diagnosis not present

## 2018-04-01 DIAGNOSIS — E1165 Type 2 diabetes mellitus with hyperglycemia: Secondary | ICD-10-CM | POA: Diagnosis not present

## 2018-04-01 DIAGNOSIS — IMO0002 Reserved for concepts with insufficient information to code with codable children: Secondary | ICD-10-CM

## 2018-04-01 DIAGNOSIS — S91301A Unspecified open wound, right foot, initial encounter: Secondary | ICD-10-CM | POA: Diagnosis not present

## 2018-04-01 DIAGNOSIS — N186 End stage renal disease: Secondary | ICD-10-CM | POA: Diagnosis not present

## 2018-04-01 DIAGNOSIS — E1122 Type 2 diabetes mellitus with diabetic chronic kidney disease: Secondary | ICD-10-CM

## 2018-04-01 LAB — POCT GLYCOSYLATED HEMOGLOBIN (HGB A1C): Hemoglobin A1C: 8.5 % — AB (ref 4.0–5.6)

## 2018-04-01 MED ORDER — TRAMADOL HCL 50 MG PO TABS: 50.0000 mg | ORAL_TABLET | Freq: Two times a day (BID) | ORAL | 0 refills | Status: DC | PRN

## 2018-04-01 NOTE — Progress Notes (Signed)
Howard Bell  MRN: [id number] DOB: 1965-08-29  PCP: Horald Pollen, MD  Subjective:  Pt is a 53 year old male who presents to clinic for right shoulder pain x 3 months. He is here today with his daughter.  He has had this pain x 3 years, however it has worsened over the past 3 months. He was here for this problem in May 2019. Seen by Dr. Mitchel Honour. And again in the ED on 06/20/2017. X-ray negative. Dr. Mitchel Honour dx with acute bursitis and gave injection of toradol 60mg  and Rx for Norco 5-325mg  with referral to orthopedics.  Howard Bell saw ortho Dr. Durward Fortes on 08/26/2017. Dx with probable osteoarthritis with impingement. He was given intraarticular cortisone injection and asked to return in 2 weeks.  Howard Bell states his shoulder pain improved following cortisone injection with Dr. Durward Fortes.  He was unaware that he was asked to return if pain returns.  Shoulder pain is keeping him up at night and he cannot find a comfortable position. No MOI.   Wound of foot. H/o h/o uncontrolled DM. He has endocrinologist, however cannot recall name of provider. Last appt was in April 2019 (per pt). Is not sure when next appt is. Cannot recall last A1C level. Endorses medication compliance.   Pt  has a past medical history of Congestive heart failure (CHF) ([place]), Diabetes mellitus without complication ([place]), Diabetic gastroparesis ([place]) (11/06/2017), ESRD (end stage renal disease) on dialysis [facility]), and Hypertension.  Review of Systems  Constitutional: Negative for chills and fever.  Musculoskeletal: Positive for arthralgias (R shoulder). Negative for joint swelling and myalgias.  Skin: Positive for wound.  Neurological: Positive for numbness. Negative for weakness.    Patient Active Problem List   Diagnosis Date Noted  . LUQ pain   . 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 Hypertensive heart disease with acute on chronic systolic  congestive heart failure ([place]) 11/16/2017  . CKD stage 5 due to type 2 diabetes mellitus ([place]) 11/16/2017  . Glaucoma due to type 2 diabetes mellitus ([place]) 11/16/2017  . Chronic generalized pain 11/16/2017  . Multifocal pneumonia 11/06/2017  . Diabetic gastroparesis ([place]) 11/06/2017  . Generalized abdominal pain 09/13/2017  . Acute pain of right shoulder 07/26/2017  . Acute bursitis of right shoulder 07/26/2017  . Left arm swelling 03/28/2017  . ESRD on dialysis ([place]) 03/28/2017  . Chest pain 11/26/2016  . Essential hypertension 11/26/2016  . Anemia due to end stage renal disease ([place]) 08/04/2016  . Acute respiratory failure with hypoxemia ([place]) 08/04/2016  . Uncontrolled type 2 diabetes mellitus with ESRD (end-stage renal disease) ([place]) 08/04/2016  . Acute CHF (congestive heart failure) ([place]) 08/04/2016  . Elevated troponin     Current Outpatient Medications on File Prior to Visit  Medication Sig Dispense Refill  . amLODipine (NORVASC) 10 MG tablet Take 1 tablet (10 mg total) by mouth daily. 30 tablet 0  . brimonidine (ALPHAGAN) 0.2 % ophthalmic solution Place 1 drop into both eyes 2 (two) times daily.    Marland Kitchen ENSURE PLUS (ENSURE PLUS) LIQD Take 237 mLs by mouth 3 (three) times daily between meals.     Marland Kitchen HUMALOG KWIKPEN 100 UNIT/ML KiwkPen INJECT UP TO 9 UNITS DAILY AS DIRECTED PER SLIDING SCALE 27 mL 0  . metoCLOPramide (REGLAN) 5 MG tablet Take 1 tablet (5 mg total) by mouth 3 (three) times daily before meals. 90 tablet 0  . metoprolol succinate (TOPROL-XL) 100 MG 24  hr tablet TAKE 1 TABLET BY MOUTH DAILY WITH BREAKFAST, OR IMMEDIATELY FOLLOWING BREAKFAST FOR HTN 90 tablet 0  . omeprazole (PRILOSEC) 40 MG capsule Take 40 mg by mouth daily.    . ondansetron (ZOFRAN ODT) 8 MG disintegrating tablet Take 1 tablet (8 mg total) by mouth every 8 (eight) hours as needed for nausea or vomiting. 10 tablet 0  . sevelamer carbonate (RENVELA) 800 MG tablet TAKE 1 TABLET BY MOUTH DAILY WITH MEALS FOR  HYPOCALCEMIA 270 tablet 0  . timolol (BETIMOL) 0.5 % ophthalmic solution Place 1 drop into both eyes 2 (two) times daily. 10 mL 1  . traMADol (ULTRAM) 50 MG tablet Take 1 tablet (50 mg total) by mouth every 12 (twelve) hours as needed. (Patient taking differently: Take 50 mg by mouth every 12 (twelve) hours as needed for moderate pain. ) 10 tablet 0  . travoprost, benzalkonium, (TRAVATAN) 0.004 % ophthalmic solution Place 1 drop into both eyes at bedtime.    Marland Kitchen acetaminophen-codeine (TYLENOL #3) 300-30 MG tablet Take 1 tablet by mouth at bedtime. (Patient not taking: Reported on 04/01/2018) 15 tablet 0  . OXYGEN Inhale 2 L/min into the lungs continuous.     No current facility-administered medications on file prior to visit.     No Known Allergies   Objective:  BP 117/80 (BP Location: Right Arm, Patient Position: Sitting, Cuff Size: Normal)   Pulse 70   Temp 98.4 F (36.9 C) (Oral)   Resp 16   Ht 6' (1.829 m)   Wt 218 lb 3.2 oz (99 kg)   SpO2 92%   BMI 29.59 kg/m   Physical Exam Vitals signs reviewed.  Constitutional:      Appearance: Normal appearance. He is obese.  Musculoskeletal:     Right shoulder: He exhibits bony tenderness. He exhibits normal range of motion, no tenderness, no swelling and no deformity.  Skin:    Findings: Wound present.     Comments: Macerated skin between third and fourth toes  Neurological:     Mental Status: He is alert.        Results for orders placed or performed in visit on 04/01/18  POCT glycosylated hemoglobin (Hb A1C)  Result Value Ref Range   Hemoglobin A1C 8.5 (A) 4.0 - 5.6 %   HbA1c POC (<> result, manual entry)     HbA1c, POC (prediabetic range)     HbA1c, POC (controlled diabetic range)      Assessment and Plan :  1. Pain in joint of right shoulder -Patient presents complaining of acute on chronic right shoulder pain.  He has received intra-articular cortisone injections in the past.  Name phone number and address of [facility] Montenegro at [facility] orthopedics provided to patient. - traMADol (ULTRAM) 50 MG tablet; Take 1 tablet (50 mg total) by mouth every 12 (twelve) hours as needed.  Dispense: 10 tablet; Refill: 0  2. Wound of right foot 3. Uncontrolled type 2 diabetes mellitus with ESRD (end-stage renal disease) ([place]) -Patient complaining of wound of his right foot secondary to uncontrolled diabetes and neuropathy.  A1c today is 8.5.  Plan to refer to wound care.  He cannot recall the name or office of his endocrinologist and is unsure about his next appointment.  He endorses medication compliance.  According to our records he last saw Dr. Loanne Drilling at [facility] over 1 year ago.  Discussed with patient importance of routine follow-ups for diabetes.  Information printed out to make appointment with Dr.  Loanne Drilling.  He understands and agrees with plan. - AMB referral to wound care center - POCT glycosylated hemoglobin (Hb A1C)    Mercer Pod, PA-C  [facility] 04/01/2018 10:10 AM  Please note: Portions of this report may have been transcribed using dragon voice recognition software. Every effort was made to ensure accuracy; however, inadvertent computerized transcription errors may be present.

## 2018-04-01 NOTE — Patient Instructions (Addendum)
1) For your shoulder - you need another injection. Please make an appointment with your orthopedist (you do not need a referral): Joni Fears, MD at [facility]  [address] [place] [address] [phone]  Take Tramadol to help your pain at night. This medication will make you drowsy. Take it only as prescribed. See below for more details.   2) You will receive a phone call next week to schedule an appointment with wound care. It is very important to control your blood sugar. Your A1C today is 8.5. You need to see your endocrinologist every 3 months for a diabetes check. According to my records, you last saw Dr. Loanne Drilling for your diabetes at [facility] [phone]  Please call them to schedule an appointment this month.   Tramadol tablets What is this medicine? TRAMADOL (TRA ma dole) is a pain reliever. It is used to treat moderate to severe pain in adults. This medicine may be used for other purposes; ask your health care provider or pharmacist if you have questions. COMMON BRAND NAME(S): Ultram What should I tell my health care provider before I take this medicine? They need to know if you have any of these conditions: -brain tumor -depression -drug abuse or addiction -head injury -if you frequently drink alcohol containing drinks -kidney disease or trouble passing urine -liver disease -lung disease, asthma, or breathing problems -seizures or epilepsy -suicidal thoughts, plans, or attempt; a previous suicide attempt by you or a family member -an unusual or allergic reaction to tramadol, codeine, other medicines, foods, dyes, or preservatives -pregnant or trying to get pregnant -breast-feeding How should I use this medicine? Take this medicine by mouth with a full glass of water. Follow the directions on the prescription label. You can take it with or without food. If it upsets your stomach, take it with food. Do not take your  medicine more often than directed. A special MedGuide will be given to you by the pharmacist with each prescription and refill. Be sure to read this information carefully each time. Talk to your pediatrician regarding the use of this medicine in children. Special care may be needed. Overdosage: If you think you have taken too much of this medicine contact a poison control center or emergency room at once. NOTE: This medicine is only for you. Do not share this medicine with others. What if I miss a dose? If you miss a dose, take it as soon as you can. If it is almost time for your next dose, take only that dose. Do not take double or extra doses. What may interact with this medicine? Do not take this medication with any of the following medicines: -MAOIs like Carbex, Eldepryl, Marplan, Nardil, and Parnate This medicine may also interact with the following medications: -alcohol -antihistamines for allergy, cough and cold -certain medicines for anxiety or sleep -certain medicines for depression like amitriptyline, fluoxetine, sertraline -certain medicines for migraine headache like almotriptan, eletriptan, frovatriptan, naratriptan, rizatriptan, sumatriptan, zolmitriptan -certain medicines for seizures like carbamazepine, oxcarbazepine, phenobarbital, primidone -certain medicines that treat or prevent blood clots like warfarin -digoxin -furazolidone -general anesthetics like halothane, isoflurane, methoxyflurane, propofol -linezolid -local anesthetics like lidocaine, pramoxine, tetracaine -medicines that relax muscles for surgery -other narcotic medicines for pain or cough -phenothiazines like chlorpromazine, mesoridazine, prochlorperazine, thioridazine -procarbazine This list may not describe all possible interactions. Give your health care provider a list of all the medicines, herbs, non-prescription drugs, or dietary supplements you use. Also tell them if  you smoke, drink alcohol, or use  illegal drugs. Some items may interact with your medicine. What should I watch for while using this medicine? Tell your doctor or health care professional if your pain does not go away, if it gets worse, or if you have new or a different type of pain. You may develop tolerance to the medicine. Tolerance means that you will need a higher dose of the medicine for pain relief. Tolerance is normal and is expected if you take this medicine for a long time. Do not suddenly stop taking your medicine because you may develop a severe reaction. Your body becomes used to the medicine. This does NOT mean you are addicted. Addiction is a behavior related to getting and using a drug for a non-medical reason. If you have pain, you have a medical reason to take pain medicine. Your doctor will tell you how much medicine to take. If your doctor wants you to stop the medicine, the dose will be slowly lowered over time to avoid any side effects. There are different types of narcotic medicines (opiates). If you take more than one type at the same time or if you are taking another medicine that also causes drowsiness, you may have more side effects. Give your health care provider a list of all medicines you use. Your doctor will tell you how much medicine to take. Do not take more medicine than directed. Call emergency for help if you have problems breathing or unusual sleepiness. You may get drowsy or dizzy. Do not drive, use machinery, or do anything that needs mental alertness until you know how this medicine affects you. Do not stand or sit up quickly, especially if you are an older patient. This reduces the risk of dizzy or fainting spells. Alcohol can increase or decrease the effects of this medicine. Avoid alcoholic drinks. You may have constipation. Try to have a bowel movement at least every 2 to 3 days. If you do not have a bowel movement for 3 days, call your doctor or health care professional. Your mouth may get dry.  Chewing sugarless gum or sucking hard candy, and drinking plenty of water may help. Contact your doctor if the problem does not go away or is severe. What side effects may I notice from receiving this medicine? Side effects that you should report to your doctor or health care professional as soon as possible: -allergic reactions like skin rash, itching or hives, swelling of the face, lips, or tongue -breathing problems -confusion -seizures -signs and symptoms of low blood pressure like dizziness; feeling faint or lightheaded, falls; unusually weak or tired -trouble passing urine or change in the amount of urine Side effects that usually do not require medical attention (report to your doctor or health care professional if they continue or are bothersome): -constipation -dry mouth -nausea, vomiting -tiredness This list may not describe all possible side effects. Call your doctor for medical advice about side effects. You may report side effects to FDA at 1-800-FDA-1088. Where should I keep my medicine? Keep out of the reach of children. This medicine may cause accidental overdose and death if it taken by other adults, children, or pets. Mix any unused medicine with a substance like cat litter or coffee grounds. Then throw the medicine away in a sealed container like a sealed bag or a coffee can with a lid. Do not use the medicine after the expiration date. Store at room temperature between 15 and 30 degrees C (59 and  86 degrees F). NOTE: This sheet is a summary. It may not cover all possible information. If you have questions about this medicine, talk to your doctor, pharmacist, or health care provider.  2019 Elsevier/Gold Standard (2014-12-01 09:00:04)  For your congestion: Mucinex (plain Mucinex. Not DM or D) will help thin out your mucus to help you clear it out. Drink plenty of water while taking this medication.  Flonase nasal spray for nasal congestion and post nasal drip.   Stay well  hydrated. Get lost of rest. Wash your hands often.   -Foods that can help speed recovery: honey, garlic, chicken soup, elderberries, green tea.  -Supplements that can help speed recovery: vitamin C, zinc, elderberry extract, quercetin, ginseng, selenium -Supplement with prebiotics and probiotics:   Cepacol throat lozenges (for sore throat).  For sore throat try using a honey-based tea. Use 3 teaspoons of honey with juice squeezed from half lemon. Place shaved pieces of ginger into 1/2-1 cup of water and warm over stove top. Then mix the ingredients and repeat every 4 hours as needed.  Cough Syrup Recipe: Sweet Lemon & Honey Thyme  Ingredients . a handful of fresh thyme sprigs   . 1 pint of water (2 cups)  . 1/2 cup honey (raw is best, but regular will do)  . 1/2 lemon chopped Instructions 1. Place the lemon in the pint jar and cover with the honey. The honey will macerate the lemons and draw out liquids which taste so delicious! 2. Meanwhile, toss the thyme leaves into a saucepan and cover them with the water. 3. Bring the water to a gentle simmer and reduce it to half, about a cup of tea. 4. When the tea is reduced and cooled a bit, strain the sprigs & leaves, add it into the pint jar and stir it well. 5. Give it a shake and use a spoonful as needed. 6. Store your homemade cough syrup in the refrigerator for about a month.  What causes a cough?  In adults, common causes of a cough include: ?An infection of the airways or lungs (such as the common cold) ?Postnasal drip - Postnasal drip is when mucus from the nose drips down or flows along the back of the throat. Postnasal drip can happen when people have: .A cold .Allergies .A sinus infection - The sinuses are hollow areas in the bones of the face that open into the nose. ?Lung conditions, like asthma and chronic obstructive pulmonary disease (COPD) - Both of these conditions can make it hard to breathe. COPD is usually caused by  smoking. ?Acid reflux - Acid reflux is when the acid that is normally in your stomach backs up into your esophagus (the tube that carries food from your mouth to your stomach). ?A side effect from blood pressure medicines called "ACE inhibitors" ?Smoking cigarettes  Is there anything I can do on my own to get rid of my cough?  Yes. To help get rid of your cough, you can: ?Use a humidifier in your bedroom ?Use an over-the-counter cough medicine, or suck on cough drops or hard candy ?Stop smoking, if you smoke ?If you have allergies, avoid the things you are allergic to (like pollen, dust, animals, or mold) If you have acid reflux, your doctor or nurse will tell you which lifestyle changes can help reduce symptoms.  Come back if you are not improving in 5-7 days.    IF you received an x-ray today, you will receive an invoice from [facility].  Please contact [facility] at [phone] with questions or concerns regarding your invoice.   IF you received labwork today, you will receive an invoice from [place]. Please contact [facility] at [phone] with questions or concerns regarding your invoice.   Our billing staff will not be able to assist you with questions regarding bills from these companies.  You will be contacted with the lab results as soon as they are available. The fastest way to get your results is to activate your My Chart account. Instructions are located on the last page of this paperwork. If you have not heard from [place] regarding the results in 2 weeks, please contact this office.

## 2018-04-03 ENCOUNTER — Telehealth: Payer: Self-pay | Admitting: Physician Assistant

## 2018-04-03 ENCOUNTER — Other Ambulatory Visit: Payer: Self-pay | Admitting: Emergency Medicine

## 2018-04-03 DIAGNOSIS — S91301A Unspecified open wound, right foot, initial encounter: Secondary | ICD-10-CM

## 2018-04-03 NOTE — Telephone Encounter (Signed)
-----   Message from [facility], PA-C sent at 04/01/2018 10:35 AM EST ----- Regarding: Wound care Please get this pt in with wound care next week.  Thank you!

## 2018-04-03 NOTE — Telephone Encounter (Signed)
Referral has been placed. 

## 2018-04-03 NOTE — Telephone Encounter (Signed)
Wound care referral placed. Thanks.

## 2018-04-03 NOTE — Telephone Encounter (Signed)
Please advise 

## 2018-04-03 NOTE — Telephone Encounter (Signed)
Please place a referral for wound care   Thank you

## 2018-04-05 DIAGNOSIS — N2581 Secondary hyperparathyroidism of renal origin: Secondary | ICD-10-CM | POA: Diagnosis not present

## 2018-04-05 DIAGNOSIS — E1129 Type 2 diabetes mellitus with other diabetic kidney complication: Secondary | ICD-10-CM | POA: Diagnosis not present

## 2018-04-05 DIAGNOSIS — N186 End stage renal disease: Secondary | ICD-10-CM | POA: Diagnosis not present

## 2018-04-06 ENCOUNTER — Ambulatory Visit: Payer: Self-pay | Admitting: *Deleted

## 2018-04-06 NOTE — Telephone Encounter (Signed)
I have called the pt to let him know that I have called the tramadol to the pharmacy. There was no answer and left a message to call back if they need anything else they need.   Thanks, [employer]

## 2018-04-06 NOTE — Telephone Encounter (Signed)
Message from [facility] sent at 04/06/2018 2:59 PM EST   Pt wife stated that the pharmacy has not received the Rx for traMADol (ULTRAM) 50 MG tablet. Pt wife requests that the Rx be sent to [facility] [place]. The Rx does not she it was sent to the pharmacy. It shows it was printed.    Please resend tramadol 50 mg to the requested pharmacy:  [facility]  [phone]    [place]. Per chart the prescription reads: no print.

## 2018-04-07 DIAGNOSIS — E1129 Type 2 diabetes mellitus with other diabetic kidney complication: Secondary | ICD-10-CM | POA: Diagnosis not present

## 2018-04-07 DIAGNOSIS — N186 End stage renal disease: Secondary | ICD-10-CM | POA: Diagnosis not present

## 2018-04-07 DIAGNOSIS — N2581 Secondary hyperparathyroidism of renal origin: Secondary | ICD-10-CM | POA: Diagnosis not present

## 2018-04-10 DIAGNOSIS — N2581 Secondary hyperparathyroidism of renal origin: Secondary | ICD-10-CM | POA: Diagnosis not present

## 2018-04-10 DIAGNOSIS — N186 End stage renal disease: Secondary | ICD-10-CM | POA: Diagnosis not present

## 2018-04-10 DIAGNOSIS — E1129 Type 2 diabetes mellitus with other diabetic kidney complication: Secondary | ICD-10-CM | POA: Diagnosis not present

## 2018-04-12 DIAGNOSIS — N186 End stage renal disease: Secondary | ICD-10-CM | POA: Diagnosis not present

## 2018-04-12 DIAGNOSIS — N2581 Secondary hyperparathyroidism of renal origin: Secondary | ICD-10-CM | POA: Diagnosis not present

## 2018-04-12 DIAGNOSIS — E1129 Type 2 diabetes mellitus with other diabetic kidney complication: Secondary | ICD-10-CM | POA: Diagnosis not present

## 2018-04-13 ENCOUNTER — Other Ambulatory Visit (HOSPITAL_COMMUNITY): Payer: Self-pay | Admitting: Internal Medicine

## 2018-04-13 ENCOUNTER — Encounter (HOSPITAL_BASED_OUTPATIENT_CLINIC_OR_DEPARTMENT_OTHER): Payer: Self-pay

## 2018-04-13 ENCOUNTER — Encounter (HOSPITAL_BASED_OUTPATIENT_CLINIC_OR_DEPARTMENT_OTHER): Payer: Medicare Other | Attending: Internal Medicine

## 2018-04-13 ENCOUNTER — Ambulatory Visit (HOSPITAL_COMMUNITY)
Admission: RE | Admit: 2018-04-13 | Discharge: 2018-04-13 | Disposition: A | Discharge status: Home / Self Care | Payer: Medicare Other | Source: Ambulatory Visit | Attending: Internal Medicine | Admitting: Internal Medicine

## 2018-04-13 DIAGNOSIS — L97509 Non-pressure chronic ulcer of other part of unspecified foot with unspecified severity: Secondary | ICD-10-CM | POA: Diagnosis not present

## 2018-04-13 DIAGNOSIS — E11621 Type 2 diabetes mellitus with foot ulcer: Secondary | ICD-10-CM | POA: Insufficient documentation

## 2018-04-13 DIAGNOSIS — E1169 Type 2 diabetes mellitus with other specified complication: Secondary | ICD-10-CM | POA: Diagnosis not present

## 2018-04-13 DIAGNOSIS — Z9989 Dependence on other enabling machines and devices: Secondary | ICD-10-CM | POA: Diagnosis not present

## 2018-04-13 DIAGNOSIS — E1142 Type 2 diabetes mellitus with diabetic polyneuropathy: Secondary | ICD-10-CM | POA: Insufficient documentation

## 2018-04-13 DIAGNOSIS — G4730 Sleep apnea, unspecified: Secondary | ICD-10-CM | POA: Diagnosis not present

## 2018-04-13 DIAGNOSIS — E11622 Type 2 diabetes mellitus with other skin ulcer: Secondary | ICD-10-CM | POA: Insufficient documentation

## 2018-04-13 DIAGNOSIS — L97222 Non-pressure chronic ulcer of left calf with fat layer exposed: Secondary | ICD-10-CM | POA: Diagnosis not present

## 2018-04-13 DIAGNOSIS — N186 End stage renal disease: Secondary | ICD-10-CM | POA: Insufficient documentation

## 2018-04-13 DIAGNOSIS — M869 Osteomyelitis, unspecified: Secondary | ICD-10-CM | POA: Insufficient documentation

## 2018-04-13 DIAGNOSIS — L97512 Non-pressure chronic ulcer of other part of right foot with fat layer exposed: Secondary | ICD-10-CM | POA: Diagnosis not present

## 2018-04-13 DIAGNOSIS — E1122 Type 2 diabetes mellitus with diabetic chronic kidney disease: Secondary | ICD-10-CM | POA: Insufficient documentation

## 2018-04-13 DIAGNOSIS — Z794 Long term (current) use of insulin: Secondary | ICD-10-CM | POA: Insufficient documentation

## 2018-04-13 DIAGNOSIS — I2510 Atherosclerotic heart disease of native coronary artery without angina pectoris: Secondary | ICD-10-CM | POA: Diagnosis not present

## 2018-04-13 DIAGNOSIS — Z992 Dependence on renal dialysis: Secondary | ICD-10-CM | POA: Insufficient documentation

## 2018-04-13 DIAGNOSIS — I120 Hypertensive chronic kidney disease with stage 5 chronic kidney disease or end stage renal disease: Secondary | ICD-10-CM | POA: Insufficient documentation

## 2018-04-13 DIAGNOSIS — I252 Old myocardial infarction: Secondary | ICD-10-CM | POA: Diagnosis not present

## 2018-04-13 DIAGNOSIS — L97519 Non-pressure chronic ulcer of other part of right foot with unspecified severity: Secondary | ICD-10-CM | POA: Diagnosis not present

## 2018-04-14 DIAGNOSIS — N186 End stage renal disease: Secondary | ICD-10-CM | POA: Diagnosis not present

## 2018-04-14 DIAGNOSIS — E1129 Type 2 diabetes mellitus with other diabetic kidney complication: Secondary | ICD-10-CM | POA: Diagnosis not present

## 2018-04-14 DIAGNOSIS — N2581 Secondary hyperparathyroidism of renal origin: Secondary | ICD-10-CM | POA: Diagnosis not present

## 2018-04-17 DIAGNOSIS — E1129 Type 2 diabetes mellitus with other diabetic kidney complication: Secondary | ICD-10-CM | POA: Diagnosis not present

## 2018-04-17 DIAGNOSIS — N2581 Secondary hyperparathyroidism of renal origin: Secondary | ICD-10-CM | POA: Diagnosis not present

## 2018-04-17 DIAGNOSIS — N186 End stage renal disease: Secondary | ICD-10-CM | POA: Diagnosis not present

## 2018-04-19 DIAGNOSIS — N186 End stage renal disease: Secondary | ICD-10-CM | POA: Diagnosis not present

## 2018-04-19 DIAGNOSIS — N2581 Secondary hyperparathyroidism of renal origin: Secondary | ICD-10-CM | POA: Diagnosis not present

## 2018-04-19 DIAGNOSIS — E1129 Type 2 diabetes mellitus with other diabetic kidney complication: Secondary | ICD-10-CM | POA: Diagnosis not present

## 2018-04-21 DIAGNOSIS — E1129 Type 2 diabetes mellitus with other diabetic kidney complication: Secondary | ICD-10-CM | POA: Diagnosis not present

## 2018-04-21 DIAGNOSIS — N186 End stage renal disease: Secondary | ICD-10-CM | POA: Diagnosis not present

## 2018-04-21 DIAGNOSIS — N2581 Secondary hyperparathyroidism of renal origin: Secondary | ICD-10-CM | POA: Diagnosis not present

## 2018-04-22 DIAGNOSIS — Z992 Dependence on renal dialysis: Secondary | ICD-10-CM | POA: Diagnosis not present

## 2018-04-22 DIAGNOSIS — E1129 Type 2 diabetes mellitus with other diabetic kidney complication: Secondary | ICD-10-CM | POA: Diagnosis not present

## 2018-04-22 DIAGNOSIS — N186 End stage renal disease: Secondary | ICD-10-CM | POA: Diagnosis not present

## 2018-04-24 DIAGNOSIS — N186 End stage renal disease: Secondary | ICD-10-CM | POA: Diagnosis not present

## 2018-04-24 DIAGNOSIS — D509 Iron deficiency anemia, unspecified: Secondary | ICD-10-CM | POA: Diagnosis not present

## 2018-04-24 DIAGNOSIS — E1129 Type 2 diabetes mellitus with other diabetic kidney complication: Secondary | ICD-10-CM | POA: Diagnosis not present

## 2018-04-24 DIAGNOSIS — D631 Anemia in chronic kidney disease: Secondary | ICD-10-CM | POA: Diagnosis not present

## 2018-04-24 DIAGNOSIS — N2581 Secondary hyperparathyroidism of renal origin: Secondary | ICD-10-CM | POA: Diagnosis not present

## 2018-04-25 ENCOUNTER — Encounter (HOSPITAL_BASED_OUTPATIENT_CLINIC_OR_DEPARTMENT_OTHER): Payer: Self-pay

## 2018-04-25 ENCOUNTER — Encounter (HOSPITAL_BASED_OUTPATIENT_CLINIC_OR_DEPARTMENT_OTHER): Payer: Medicare Other | Attending: Internal Medicine

## 2018-04-25 DIAGNOSIS — L97512 Non-pressure chronic ulcer of other part of right foot with fat layer exposed: Secondary | ICD-10-CM | POA: Diagnosis not present

## 2018-04-25 DIAGNOSIS — I120 Hypertensive chronic kidney disease with stage 5 chronic kidney disease or end stage renal disease: Secondary | ICD-10-CM | POA: Insufficient documentation

## 2018-04-25 DIAGNOSIS — Z992 Dependence on renal dialysis: Secondary | ICD-10-CM | POA: Insufficient documentation

## 2018-04-25 DIAGNOSIS — N186 End stage renal disease: Secondary | ICD-10-CM | POA: Insufficient documentation

## 2018-04-25 DIAGNOSIS — I252 Old myocardial infarction: Secondary | ICD-10-CM | POA: Insufficient documentation

## 2018-04-25 DIAGNOSIS — I509 Heart failure, unspecified: Secondary | ICD-10-CM | POA: Diagnosis not present

## 2018-04-25 DIAGNOSIS — E11622 Type 2 diabetes mellitus with other skin ulcer: Secondary | ICD-10-CM | POA: Diagnosis not present

## 2018-04-25 DIAGNOSIS — E1122 Type 2 diabetes mellitus with diabetic chronic kidney disease: Secondary | ICD-10-CM | POA: Diagnosis not present

## 2018-04-25 DIAGNOSIS — E11621 Type 2 diabetes mellitus with foot ulcer: Secondary | ICD-10-CM | POA: Insufficient documentation

## 2018-04-25 DIAGNOSIS — I2510 Atherosclerotic heart disease of native coronary artery without angina pectoris: Secondary | ICD-10-CM | POA: Insufficient documentation

## 2018-04-25 DIAGNOSIS — Z794 Long term (current) use of insulin: Secondary | ICD-10-CM | POA: Diagnosis not present

## 2018-04-25 DIAGNOSIS — E1142 Type 2 diabetes mellitus with diabetic polyneuropathy: Secondary | ICD-10-CM | POA: Diagnosis not present

## 2018-04-25 DIAGNOSIS — L97222 Non-pressure chronic ulcer of left calf with fat layer exposed: Secondary | ICD-10-CM | POA: Diagnosis not present

## 2018-04-26 DIAGNOSIS — D631 Anemia in chronic kidney disease: Secondary | ICD-10-CM | POA: Diagnosis not present

## 2018-04-26 DIAGNOSIS — N186 End stage renal disease: Secondary | ICD-10-CM | POA: Diagnosis not present

## 2018-04-26 DIAGNOSIS — E1129 Type 2 diabetes mellitus with other diabetic kidney complication: Secondary | ICD-10-CM | POA: Diagnosis not present

## 2018-04-26 DIAGNOSIS — N2581 Secondary hyperparathyroidism of renal origin: Secondary | ICD-10-CM | POA: Diagnosis not present

## 2018-04-26 DIAGNOSIS — D509 Iron deficiency anemia, unspecified: Secondary | ICD-10-CM | POA: Diagnosis not present

## 2018-04-28 ENCOUNTER — Other Ambulatory Visit (HOSPITAL_COMMUNITY): Payer: Self-pay | Admitting: Internal Medicine

## 2018-04-28 DIAGNOSIS — N2581 Secondary hyperparathyroidism of renal origin: Secondary | ICD-10-CM | POA: Diagnosis not present

## 2018-04-28 DIAGNOSIS — E1169 Type 2 diabetes mellitus with other specified complication: Principal | ICD-10-CM

## 2018-04-28 DIAGNOSIS — D631 Anemia in chronic kidney disease: Secondary | ICD-10-CM | POA: Diagnosis not present

## 2018-04-28 DIAGNOSIS — L97509 Non-pressure chronic ulcer of other part of unspecified foot with unspecified severity: Principal | ICD-10-CM

## 2018-04-28 DIAGNOSIS — D509 Iron deficiency anemia, unspecified: Secondary | ICD-10-CM | POA: Diagnosis not present

## 2018-04-28 DIAGNOSIS — M869 Osteomyelitis, unspecified: Principal | ICD-10-CM

## 2018-04-28 DIAGNOSIS — E11621 Type 2 diabetes mellitus with foot ulcer: Secondary | ICD-10-CM

## 2018-04-28 DIAGNOSIS — N186 End stage renal disease: Secondary | ICD-10-CM | POA: Diagnosis not present

## 2018-04-28 DIAGNOSIS — E1129 Type 2 diabetes mellitus with other diabetic kidney complication: Secondary | ICD-10-CM | POA: Diagnosis not present

## 2018-05-01 DIAGNOSIS — D509 Iron deficiency anemia, unspecified: Secondary | ICD-10-CM | POA: Diagnosis not present

## 2018-05-01 DIAGNOSIS — N2581 Secondary hyperparathyroidism of renal origin: Secondary | ICD-10-CM | POA: Diagnosis not present

## 2018-05-01 DIAGNOSIS — D631 Anemia in chronic kidney disease: Secondary | ICD-10-CM | POA: Diagnosis not present

## 2018-05-01 DIAGNOSIS — E1129 Type 2 diabetes mellitus with other diabetic kidney complication: Secondary | ICD-10-CM | POA: Diagnosis not present

## 2018-05-01 DIAGNOSIS — N186 End stage renal disease: Secondary | ICD-10-CM | POA: Diagnosis not present

## 2018-05-02 ENCOUNTER — Emergency Department (HOSPITAL_COMMUNITY)
Admission: EM | Admit: 2018-05-02 | Discharge: 2018-05-02 | Disposition: A | Discharge status: Home / Self Care | Payer: Medicare Other | Attending: Emergency Medicine | Admitting: Emergency Medicine

## 2018-05-02 ENCOUNTER — Emergency Department (HOSPITAL_COMMUNITY): Payer: Medicare Other

## 2018-05-02 ENCOUNTER — Encounter (HOSPITAL_COMMUNITY): Payer: Self-pay

## 2018-05-02 ENCOUNTER — Other Ambulatory Visit: Payer: Self-pay

## 2018-05-02 DIAGNOSIS — J111 Influenza due to unidentified influenza virus with other respiratory manifestations: Secondary | ICD-10-CM | POA: Insufficient documentation

## 2018-05-02 DIAGNOSIS — R000 Tachycardia, unspecified: Secondary | ICD-10-CM | POA: Diagnosis not present

## 2018-05-02 DIAGNOSIS — Z992 Dependence on renal dialysis: Secondary | ICD-10-CM | POA: Insufficient documentation

## 2018-05-02 DIAGNOSIS — N186 End stage renal disease: Secondary | ICD-10-CM | POA: Insufficient documentation

## 2018-05-02 DIAGNOSIS — I132 Hypertensive heart and chronic kidney disease with heart failure and with stage 5 chronic kidney disease, or end stage renal disease: Secondary | ICD-10-CM | POA: Diagnosis not present

## 2018-05-02 DIAGNOSIS — J101 Influenza due to other identified influenza virus with other respiratory manifestations: Secondary | ICD-10-CM | POA: Diagnosis not present

## 2018-05-02 DIAGNOSIS — E119 Type 2 diabetes mellitus without complications: Secondary | ICD-10-CM | POA: Insufficient documentation

## 2018-05-02 DIAGNOSIS — J811 Chronic pulmonary edema: Secondary | ICD-10-CM | POA: Diagnosis not present

## 2018-05-02 DIAGNOSIS — I5022 Chronic systolic (congestive) heart failure: Secondary | ICD-10-CM | POA: Insufficient documentation

## 2018-05-02 DIAGNOSIS — R0602 Shortness of breath: Secondary | ICD-10-CM | POA: Diagnosis not present

## 2018-05-02 DIAGNOSIS — Z794 Long term (current) use of insulin: Secondary | ICD-10-CM | POA: Insufficient documentation

## 2018-05-02 DIAGNOSIS — R05 Cough: Secondary | ICD-10-CM | POA: Diagnosis present

## 2018-05-02 DIAGNOSIS — Z79899 Other long term (current) drug therapy: Secondary | ICD-10-CM | POA: Diagnosis not present

## 2018-05-02 LAB — BASIC METABOLIC PANEL
Anion gap: 17 — ABNORMAL HIGH (ref 5–15)
BUN: 46 mg/dL — ABNORMAL HIGH (ref 6–20)
CO2: 29 mmol/L (ref 22–32)
Calcium: 8.9 mg/dL (ref 8.9–10.3)
Chloride: 90 mmol/L — ABNORMAL LOW (ref 98–111)
Creatinine, Ser: 10.28 mg/dL — ABNORMAL HIGH (ref 0.61–1.24)
GFR calc Af Amer: 6 mL/min — ABNORMAL LOW (ref >60)
GFR calc non Af Amer: 5 mL/min — ABNORMAL LOW (ref >60)
Glucose, Bld: 145 mg/dL — ABNORMAL HIGH (ref 70–99)
Potassium: 4.8 mmol/L (ref 3.5–5.1)
Sodium: 136 mmol/L (ref 135–145)

## 2018-05-02 LAB — CBC WITH DIFFERENTIAL/PLATELET
Abs Immature Granulocytes: 0.04 10*3/uL (ref 0.00–0.07)
Basophils Absolute: 0 10*3/uL (ref 0.0–0.1)
Basophils Relative: 1 %
Eosinophils Absolute: 0.2 10*3/uL (ref 0.0–0.5)
Eosinophils Relative: 3 %
HCT: 36.5 % — ABNORMAL LOW (ref 39.0–52.0)
Hemoglobin: 11.3 g/dL — ABNORMAL LOW (ref 13.0–17.0)
Immature Granulocytes: 1 %
Lymphocytes Relative: 17 %
Lymphs Abs: 0.9 10*3/uL (ref 0.7–4.0)
MCH: 29.5 pg (ref 26.0–34.0)
MCHC: 31 g/dL (ref 30.0–36.0)
MCV: 95.3 fL (ref 80.0–100.0)
Monocytes Absolute: 1.1 10*3/uL — ABNORMAL HIGH (ref 0.1–1.0)
Monocytes Relative: 20 %
Neutro Abs: 3.3 10*3/uL (ref 1.7–7.7)
Neutrophils Relative %: 58 %
Platelets: 202 10*3/uL (ref 150–400)
RBC: 3.83 MIL/uL — ABNORMAL LOW (ref 4.22–5.81)
RDW: 14.4 % (ref 11.5–15.5)
WBC: 5.6 10*3/uL (ref 4.0–10.5)
nRBC: 0 % (ref 0.0–0.2)

## 2018-05-02 LAB — INFLUENZA PANEL BY PCR (TYPE A & B)
Influenza A By PCR: POSITIVE — AB
Influenza B By PCR: NEGATIVE

## 2018-05-02 LAB — TROPONIN I: Troponin I: 0.06 ng/mL (ref <0.03)

## 2018-05-02 LAB — BRAIN NATRIURETIC PEPTIDE: B Natriuretic Peptide: 226.8 pg/mL — ABNORMAL HIGH (ref 0.0–100.0)

## 2018-05-02 MED ORDER — OSELTAMIVIR PHOSPHATE 30 MG PO CAPS: 30.0000 mg | ORAL_CAPSULE | ORAL | 0 refills | Status: DC

## 2018-05-02 MED ORDER — OSELTAMIVIR PHOSPHATE 30 MG PO CAPS
30.0000 mg | ORAL_CAPSULE | Freq: Once | ORAL | Status: AC
  Administered 2018-05-02: 30 mg via ORAL
  Filled 2018-05-02: qty 1

## 2018-05-02 MED ORDER — ONDANSETRON HCL 4 MG PO TABS: 4.0000 mg | ORAL_TABLET | Freq: Three times a day (TID) | ORAL | 0 refills | Status: DC | PRN

## 2018-05-02 NOTE — ED Triage Notes (Signed)
Pt reports flu like symptoms for the past 1-2 weeks. (productive cough, fever, body aches). Pt receives dialysis MWF. Compliant with schedule. Pt 83% on room air in triage, 2L [place] applied and now at 96%. Nad noted, pt alert

## 2018-05-02 NOTE — ED Notes (Signed)
Pt placed on 2L of O2 [place] due to low O2 saturation.

## 2018-05-02 NOTE — ED Notes (Signed)
Pt on room air at this time, oxygen saturations maintaining 94%

## 2018-05-03 DIAGNOSIS — D631 Anemia in chronic kidney disease: Secondary | ICD-10-CM | POA: Diagnosis not present

## 2018-05-03 DIAGNOSIS — N186 End stage renal disease: Secondary | ICD-10-CM | POA: Diagnosis not present

## 2018-05-03 DIAGNOSIS — N2581 Secondary hyperparathyroidism of renal origin: Secondary | ICD-10-CM | POA: Diagnosis not present

## 2018-05-03 DIAGNOSIS — D509 Iron deficiency anemia, unspecified: Secondary | ICD-10-CM | POA: Diagnosis not present

## 2018-05-03 DIAGNOSIS — E1129 Type 2 diabetes mellitus with other diabetic kidney complication: Secondary | ICD-10-CM | POA: Diagnosis not present

## 2018-05-04 ENCOUNTER — Ambulatory Visit (HOSPITAL_COMMUNITY)
Admission: RE | Admit: 2018-05-04 | Discharge: 2018-05-04 | Disposition: A | Discharge status: Home / Self Care | Payer: Medicare Other | Source: Ambulatory Visit | Attending: Cardiology | Admitting: Cardiology

## 2018-05-04 DIAGNOSIS — E11621 Type 2 diabetes mellitus with foot ulcer: Secondary | ICD-10-CM | POA: Diagnosis not present

## 2018-05-04 DIAGNOSIS — L97509 Non-pressure chronic ulcer of other part of unspecified foot with unspecified severity: Secondary | ICD-10-CM | POA: Insufficient documentation

## 2018-05-04 DIAGNOSIS — E1169 Type 2 diabetes mellitus with other specified complication: Secondary | ICD-10-CM | POA: Insufficient documentation

## 2018-05-04 DIAGNOSIS — M869 Osteomyelitis, unspecified: Secondary | ICD-10-CM | POA: Diagnosis not present

## 2018-05-05 DIAGNOSIS — N2581 Secondary hyperparathyroidism of renal origin: Secondary | ICD-10-CM | POA: Diagnosis not present

## 2018-05-05 DIAGNOSIS — N186 End stage renal disease: Secondary | ICD-10-CM | POA: Diagnosis not present

## 2018-05-05 DIAGNOSIS — E1129 Type 2 diabetes mellitus with other diabetic kidney complication: Secondary | ICD-10-CM | POA: Diagnosis not present

## 2018-05-05 DIAGNOSIS — D509 Iron deficiency anemia, unspecified: Secondary | ICD-10-CM | POA: Diagnosis not present

## 2018-05-05 DIAGNOSIS — D631 Anemia in chronic kidney disease: Secondary | ICD-10-CM | POA: Diagnosis not present

## 2018-05-06 NOTE — ED Provider Notes (Signed)
[facility] EMERGENCY DEPARTMENT Provider Note   CSN: [id number] Arrival date & time: 05/02/18  1535     History   Chief Complaint Chief Complaint  Patient presents with  . Fever  . Cough  . Emesis    HPI Howard Bell is a 53 y.o. male.  HPI   52yM with cough, fever and generalized weakness. Onset about a week ago. Persistent since then. Body aches. ESRD. Reports compliance with HD. Mild nausea. No v/d. Essentially anuric.   Past Medical History:  Diagnosis Date  . Congestive heart failure (CHF) ([place])   . 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 Diabetic gastroparesis ([place]) 11/06/2017  . ESRD (end stage renal disease) on dialysis ([place])   . Hypertension     Patient Active Problem List   Diagnosis Date Noted  . LUQ pain   . 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 ([place]) 11/16/2017  . CKD stage 5 due to type 2 diabetes mellitus ([place]) 11/16/2017  . Glaucoma due to type 2 diabetes mellitus ([place]) 11/16/2017  . Chronic generalized pain 11/16/2017  . Multifocal pneumonia 11/06/2017  . Diabetic gastroparesis ([place]) 11/06/2017  . Generalized abdominal pain 09/13/2017  . Acute pain of right shoulder 07/26/2017  . Acute bursitis of right shoulder 07/26/2017  . Left arm swelling 03/28/2017  . ESRD on dialysis ([place]) 03/28/2017  . Chest pain 11/26/2016  . Essential hypertension 11/26/2016  . Anemia due to end stage renal disease ([place]) 08/04/2016  . Acute respiratory failure with hypoxemia ([place]) 08/04/2016  . Uncontrolled type 2 diabetes mellitus with ESRD (end-stage renal disease) ([place]) 08/04/2016  . Acute CHF (congestive heart failure) ([place]) 08/04/2016  . Elevated troponin     Past Surgical History:  Procedure Laterality Date  . ESOPHAGOGASTRODUODENOSCOPY (EGD) WITH PROPOFOL N/A 12/05/2017   Procedure: ESOPHAGOGASTRODUODENOSCOPY (EGD) WITH PROPOFOL;  Surgeon: Doran Stabler, MD;  Location: [facility] ENDOSCOPY;  Service: Gastroenterology;  Laterality: N/A;  . HERNIA REPAIR    . IR AV DIALY SHUNT INTRO NEEDLE/INTRACATH INITIAL W/PTA/IMG LEFT  03/30/2017        Home Medications    Prior to Admission medications   Medication Sig Start Date End Date Taking? Authorizing Provider  acetaminophen-codeine (TYLENOL #3) 300-30 MG tablet Take 1 tablet by mouth at bedtime. Patient not taking: Reported on 04/01/2018 11/29/17   Gerlene Fee, NP  amLODipine (NORVASC) 10 MG tablet Take 1 tablet (10 mg total) by mouth daily. 03/30/17   Hongalgi, Lenis Dickinson, MD  brimonidine (ALPHAGAN) 0.2 % ophthalmic solution Place 1 drop into both eyes 2 (two) times daily.    [provider]  ENSURE PLUS (ENSURE PLUS) LIQD Take 237 mLs by mouth 3 (three) times daily between meals.     [provider]  HUMALOG KWIKPEN 100 UNIT/ML KiwkPen INJECT UP TO 9 UNITS DAILY AS DIRECTED PER SLIDING SCALE 12/13/17   Sagardia, Ines Bloomer, MD  metoCLOPramide (REGLAN) 5 MG tablet Take 1 tablet (5 mg total) by mouth 3 (three) times daily before meals. 09/23/17   Nelida Meuse III, MD  metoprolol succinate (TOPROL-XL) 100 MG 24 hr tablet TAKE 1 TABLET BY MOUTH DAILY WITH BREAKFAST, OR IMMEDIATELY FOLLOWING BREAKFAST FOR HTN 12/13/17   Horald Pollen, MD  omeprazole (PRILOSEC) 40 MG capsule Take 40 mg by mouth daily. 11/12/17   [provider]  ondansetron (ZOFRAN ODT) 8 MG disintegrating tablet Take 1 tablet (8 mg total) by mouth every 8 (eight) hours as needed for nausea or vomiting. 09/20/17   Rutherford Guys, MD  ondansetron (ZOFRAN) 4 MG tablet Take 1 tablet (4 mg total) by mouth every 8 (eight) hours as needed for nausea or vomiting. 05/02/18   Virgel Manifold, MD  oseltamivir (TAMIFLU) 30 MG capsule Take 1 capsule (30 mg total) by mouth every other day. Take 1 tablet AFTER your next two dialysis sessions  05/02/18   Virgel Manifold, MD  OXYGEN Inhale 2 L/min into the lungs continuous.    [provider]  sevelamer carbonate (RENVELA) 800 MG tablet TAKE 1 TABLET BY MOUTH DAILY WITH MEALS FOR HYPOCALCEMIA 12/13/17   Sagardia, Ines Bloomer, MD  timolol (BETIMOL) 0.5 % ophthalmic solution Place 1 drop into both eyes 2 (two) times daily. 05/07/17   Ivar Drape D, PA  traMADol (ULTRAM) 50 MG tablet Take 1 tablet (50 mg total) by mouth every 12 (twelve) hours as needed. 04/01/18   McVey, Gelene Mink, PA-C  travoprost, benzalkonium, (TRAVATAN) 0.004 % ophthalmic solution Place 1 drop into both eyes at bedtime.    [provider]    Family History Family History  Problem Relation Age of Onset  . Diabetes Mother   . Hypertension Father     Social History Social History   Tobacco Use  . Smoking status: Never Smoker  . Smokeless tobacco: Never Used  Substance Use Topics  . Alcohol use: No  . Drug use: No     Allergies   Patient has no known allergies.   Review of Systems Review of Systems  All systems reviewed and negative, other than as noted in HPI.  Physical Exam Updated Vital Signs BP (!) 177/92   Pulse 97   Temp 99 F (37.2 C) (Oral)   Resp 20   Ht 6' (1.829 m)   Wt 97.5 kg   SpO2 96%   BMI 29.16 kg/m   Physical Exam Vitals signs and nursing note reviewed.  Constitutional:      General: He is not in acute distress.    Appearance: He is well-developed.  HENT:     Head: Normocephalic and atraumatic.  Eyes:     General:        Right eye: No discharge.        Left eye: No discharge.     Conjunctiva/sclera: Conjunctivae normal.  Neck:     Musculoskeletal: Neck supple.  Cardiovascular:     Rate and Rhythm: Regular rhythm. Tachycardia present.     Heart sounds: Normal heart sounds. No murmur. No friction rub. No gallop.   Pulmonary:     Effort: Pulmonary effort is normal. No respiratory distress.     Breath sounds: Normal breath sounds.    Abdominal:     General: There is no distension.     Palpations: Abdomen is soft.     Tenderness: There is no abdominal tenderness.  Musculoskeletal:        General: No tenderness.  Skin:    General: Skin is warm and dry.  Neurological:     Mental Status: He is alert.  Psychiatric:        Behavior: Behavior normal.        Thought Content: Thought content normal.      ED Treatments / Results  Labs (all labs ordered are listed, but only abnormal results are displayed) Labs Reviewed  CBC  WITH DIFFERENTIAL/PLATELET - Abnormal; Notable for the following components:      Result Value   RBC 3.83 (*)    Hemoglobin 11.3 (*)    HCT 36.5 (*)    Monocytes Absolute 1.1 (*)    All other components within normal limits  BASIC METABOLIC PANEL - Abnormal; Notable for the following components:   Chloride 90 (*)    Glucose, Bld 145 (*)    BUN 46 (*)    Creatinine, Ser 10.28 (*)    GFR calc non Af Amer 5 (*)    GFR calc Af Amer 6 (*)    Anion gap 17 (*)    All other components within normal limits  INFLUENZA PANEL BY PCR (TYPE A & B) - Abnormal; Notable for the following components:   Influenza A By PCR POSITIVE (*)    All other components within normal limits  BRAIN NATRIURETIC PEPTIDE - Abnormal; Notable for the following components:   B Natriuretic Peptide 226.8 (*)    All other components within normal limits  TROPONIN I - Abnormal; Notable for the following components:   Troponin I 0.06 (*)    All other components within normal limits    EKG EKG Interpretation  Date/Time:  Tuesday May 02 2018 15:50:48 EST Ventricular Rate:  110 PR Interval:  186 QRS Duration: 102 QT Interval:  336 QTC Calculation: 454 R Axis:   -61 Text Interpretation:  Sinus tachycardia Left axis deviation Anterior infarct , age undetermined Abnormal ECG Confirmed by Virgel Manifold [phone]) on 05/02/2018 5:44:02 PM   Radiology No results found.  Procedures Procedures (including critical care  time)  Medications Ordered in ED Medications  oseltamivir (TAMIFLU) capsule 30 mg (30 mg Oral Given 05/02/18 2241)     Initial Impression / Assessment and Plan / ED Course  I have reviewed the triage vital signs and the nursing notes.  Pertinent labs & imaging results that were available during my care of the patient were reviewed by me and considered in my medical decision making (see chart for details).     45yM with influenza. Appears tired but not toxic. Ambulated with good o2 sats on RA prior to DC. Renal dosing of tamiflu.   Final Clinical Impressions(s) / ED Diagnoses   Final diagnoses:  Influenza A    ED Discharge Orders         Ordered    oseltamivir (TAMIFLU) 30 MG capsule  Every other day     05/02/18 2223    ondansetron (ZOFRAN) 4 MG tablet  Every 8 hours PRN     05/02/18 2223           Virgel Manifold, MD 05/06/18 2049

## 2018-05-08 DIAGNOSIS — N2581 Secondary hyperparathyroidism of renal origin: Secondary | ICD-10-CM | POA: Diagnosis not present

## 2018-05-08 DIAGNOSIS — D509 Iron deficiency anemia, unspecified: Secondary | ICD-10-CM | POA: Diagnosis not present

## 2018-05-08 DIAGNOSIS — D631 Anemia in chronic kidney disease: Secondary | ICD-10-CM | POA: Diagnosis not present

## 2018-05-08 DIAGNOSIS — E1129 Type 2 diabetes mellitus with other diabetic kidney complication: Secondary | ICD-10-CM | POA: Diagnosis not present

## 2018-05-08 DIAGNOSIS — N186 End stage renal disease: Secondary | ICD-10-CM | POA: Diagnosis not present

## 2018-05-09 ENCOUNTER — Encounter: Payer: Self-pay | Admitting: Cardiovascular Disease

## 2018-05-09 ENCOUNTER — Ambulatory Visit (INDEPENDENT_AMBULATORY_CARE_PROVIDER_SITE_OTHER): Payer: Medicare Other | Admitting: Cardiovascular Disease

## 2018-05-09 VITALS — BP 111/69 | HR 105 | Ht 72.0 in | Wt 219.4 lb

## 2018-05-09 DIAGNOSIS — Z959 Presence of cardiac and vascular implant and graft, unspecified: Secondary | ICD-10-CM

## 2018-05-09 DIAGNOSIS — I998 Other disorder of circulatory system: Secondary | ICD-10-CM

## 2018-05-09 DIAGNOSIS — I70229 Atherosclerosis of native arteries of extremities with rest pain, unspecified extremity: Secondary | ICD-10-CM | POA: Insufficient documentation

## 2018-05-09 DIAGNOSIS — I110 Hypertensive heart disease with heart failure: Secondary | ICD-10-CM

## 2018-05-09 DIAGNOSIS — I5023 Acute on chronic systolic (congestive) heart failure: Secondary | ICD-10-CM

## 2018-05-09 DIAGNOSIS — I519 Heart disease, unspecified: Secondary | ICD-10-CM | POA: Diagnosis not present

## 2018-05-09 DIAGNOSIS — I10 Essential (primary) hypertension: Secondary | ICD-10-CM | POA: Diagnosis not present

## 2018-05-09 DIAGNOSIS — Z9889 Other specified postprocedural states: Secondary | ICD-10-CM

## 2018-05-09 NOTE — Assessment & Plan Note (Signed)
History of essential hypertension blood pressure measured today at 111/69.  He is on amlodipine and metoprolol.

## 2018-05-09 NOTE — Assessment & Plan Note (Signed)
Howard Bell sent to me by Dr. Dellia Nims for a wound on his right foot.  This is been present for last 2 weeks.  He had Dopplers performed in our office 05/04/2018 revealing noncompressible vessels bilaterally with unobtainable ABIs but otherwise triphasic waveforms in the right leg including the tibial vessels without elevated velocities suggesting that he has no significant obstructive disease contributing to his foot wound.  I suspect this is multifactorial from small vessel disease and superimposed infection.  He is on oral antibiotics.Marland Kitchen

## 2018-05-09 NOTE — Progress Notes (Signed)
05/09/2018 Howard Bell   1965/06/05  [id number]  Primary Physician Sagardia, Ines Bloomer, MD Primary Cardiologist: Lorretta Harp MD Lupe Carney, [place]  HPI:  Howard Bell is a 53 y.o. moderately overweight married African-American male father of no children accompanied by his wife Howard Bell today.  He was referred by Dr. Dellia Nims, wound care specialist, for evaluation of a right foot wound.  He does have a history of chronic renal insufficiency on hemodialysis for last 9 years.  He has had a remote wound on his left foot with healed with aggressive local wound care.  His vascular risk factors include treated hypertension, diabetes and hyperlipidemia.  Is a question of remote MI and CVA.  He had cardiac catheterization performed in [facility] 10 years ago which was apparently normal.  He has a history of congestive heart failure as well.   Current Meds  Medication Sig  . albuterol (PROVENTIL HFA;VENTOLIN HFA) 108 (90 Base) MCG/ACT inhaler Inhale 1 puff into the lungs every 8 (eight) hours as needed for wheezing or shortness of breath.  Marland Kitchen amLODipine (NORVASC) 10 MG tablet Take 1 tablet (10 mg total) by mouth daily.  Marland Kitchen amoxicillin-clavulanate (AUGMENTIN) 500-125 MG tablet Take 1 tablet by mouth daily.  . brimonidine (ALPHAGAN) 0.2 % ophthalmic solution Place 1 drop into both eyes 2 (two) times daily.  Marland Kitchen ENSURE PLUS (ENSURE PLUS) LIQD Take 237 mLs by mouth 3 (three) times daily between meals.   Marland Kitchen HUMALOG KWIKPEN 100 UNIT/ML KiwkPen INJECT UP TO 9 UNITS DAILY AS DIRECTED PER SLIDING SCALE  . metoCLOPramide (REGLAN) 5 MG tablet Take 1 tablet (5 mg total) by mouth 3 (three) times daily before meals.  . metoprolol succinate (TOPROL-XL) 100 MG 24 hr tablet TAKE 1 TABLET BY MOUTH DAILY WITH BREAKFAST, OR IMMEDIATELY FOLLOWING BREAKFAST FOR HTN  . omeprazole (PRILOSEC) 40 MG capsule Take 40 mg by mouth daily.  . ondansetron (ZOFRAN ODT) 8 MG disintegrating tablet Take 1  tablet (8 mg total) by mouth every 8 (eight) hours as needed for nausea or vomiting.  . ondansetron (ZOFRAN) 4 MG tablet Take 1 tablet (4 mg total) by mouth every 8 (eight) hours as needed for nausea or vomiting.  . sevelamer carbonate (RENVELA) 800 MG tablet TAKE 1 TABLET BY MOUTH DAILY WITH MEALS FOR HYPOCALCEMIA  . timolol (BETIMOL) 0.5 % ophthalmic solution Place 1 drop into both eyes 2 (two) times daily.  . travoprost, benzalkonium, (TRAVATAN) 0.004 % ophthalmic solution Place 1 drop into both eyes at bedtime.     No Known Allergies  Social History   Socioeconomic History  . Marital status: Married    Spouse name: Not on file  . Number of children: Not on file  . Years of education: Not on file  . Highest education level: Not on file  Occupational History  . Not on file  Social Needs  . Financial resource strain: Not on file  . Food insecurity:    Worry: Not on file    Inability: Not on file  . Transportation needs:    Medical: Not on file    Non-medical: Not on file  Tobacco Use  . Smoking status: Never Smoker  . Smokeless tobacco: Never Used  Substance and Sexual Activity  . Alcohol use: No  . Drug use: No  . Sexual activity: Not on file  Lifestyle  . Physical activity:    Days per week: Not on file    Minutes per session: Not  on file  . Stress: Not on file  Relationships  . Social connections:    Talks on phone: Not on file    Gets together: Not on file    Attends religious service: Not on file    Active member of club or organization: Not on file    Attends meetings of clubs or organizations: Not on file    Relationship status: Not on file  . Intimate partner violence:    Fear of current or ex partner: Not on file    Emotionally abused: Not on file    Physically abused: Not on file    Forced sexual activity: Not on file  Other Topics Concern  . Not on file  Social History Narrative  . Not on file     Review of Systems: General: negative for chills,  fever, night sweats or weight changes.  Cardiovascular: negative for chest pain, dyspnea on exertion, edema, orthopnea, palpitations, paroxysmal nocturnal dyspnea or shortness of breath Dermatological: negative for rash Respiratory: negative for cough or wheezing Urologic: negative for hematuria Abdominal: negative for nausea, vomiting, diarrhea, bright red blood per rectum, melena, or hematemesis Neurologic: negative for visual changes, syncope, or dizziness All other systems reviewed and are otherwise negative except as noted above.    Blood pressure 111/69, pulse (!) 105, height 6' (1.829 m), weight 219 lb 6.4 oz (99.5 kg), SpO2 91 %.  General appearance: alert and no distress Neck: no adenopathy, no carotid bruit, no JVD, supple, symmetrical, trachea midline and thyroid not enlarged, symmetric, no tenderness/mass/nodules Lungs: clear to auscultation bilaterally Heart: regular rate and rhythm, S1, S2 normal, no murmur, click, rub or gallop Extremities: extremities normal, atraumatic, no cyanosis or edema Pulses: 2+ and symmetric Skin: Black eschar large area of the right foot. Neurologic: Alert and oriented X 3, normal strength and tone. Normal symmetric reflexes. Normal coordination and gait  EKG not performed today  ASSESSMENT AND PLAN:   Essential hypertension History of essential hypertension blood pressure measured today at 111/69.  He is on amlodipine and metoprolol.  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 (HCC) History of congestive heart failure with unknown EF.  Will check a 2D echo to further evaluate.  Critical limb ischemia with history of revascularization of same extremity [name] sent to me by Dr. Dellia Nims for a wound on his right foot.  This is been present for last 2 weeks.  He had Dopplers performed in our office 05/04/2018 revealing noncompressible vessels bilaterally with unobtainable ABIs but otherwise triphasic  waveforms in the right leg including the tibial vessels without elevated velocities suggesting that he has no significant obstructive disease contributing to his foot wound.  I suspect this is multifactorial from small vessel disease and superimposed infection.  He is on oral antibiotics.Lorretta Harp MD FACP,FACC,FAHA, [facility] 05/09/2018 2:47 PM

## 2018-05-09 NOTE — Assessment & Plan Note (Signed)
History of congestive heart failure with unknown EF.  Will check a 2D echo to further evaluate.

## 2018-05-09 NOTE — Patient Instructions (Signed)
Medication Instructions:  Your physician recommends that you continue on your current medications as directed. Please refer to the Current Medication list given to you today.  If you need a refill on your cardiac medications before your next appointment, please call your pharmacy.   Lab work: NONE If you have labs (blood work) drawn today and your tests are completely normal, you will receive your results only by: Marland Kitchen MyChart Message (if you have MyChart) OR . A paper copy in the mail If you have any lab test that is abnormal or we need to change your treatment, we will call you to review the results.  Testing/Procedures: Your physician has requested that you have an echocardiogram. Echocardiography is a painless test that uses sound waves to create images of your heart. It provides your doctor with information about the size and shape of your heart and how well your heart's chambers and valves are working. This procedure takes approximately one hour. There are no restrictions for this procedure.   Follow-Up: At [facility], you and your health needs are our priority.  As part of our continuing mission to provide you with exceptional heart care, we have created designated Provider Care Teams.  These Care Teams include your primary Cardiologist (physician) and Advanced Practice Providers (APPs -  Physician Assistants and Nurse Practitioners) who all work together to provide you with the care you need, when you need it. . You will need a follow up appointment in 3 months with an APP and 6 months with Dr. Gwenlyn Found.  Please call our office 2 months in advance to schedule this appointment.  You may see one of the following Advanced Practice Providers on your designated Care Team:   . Kerin Ransom, [place] . Almyra Deforest, PA-C . Fabian Sharp, PA-C . Jory Sims, DNP . Rosaria Ferries, PA-C . Roby Lofts, PA-C . Sande Rives, PA-C

## 2018-05-10 DIAGNOSIS — D631 Anemia in chronic kidney disease: Secondary | ICD-10-CM | POA: Diagnosis not present

## 2018-05-10 DIAGNOSIS — D509 Iron deficiency anemia, unspecified: Secondary | ICD-10-CM | POA: Diagnosis not present

## 2018-05-10 DIAGNOSIS — N2581 Secondary hyperparathyroidism of renal origin: Secondary | ICD-10-CM | POA: Diagnosis not present

## 2018-05-10 DIAGNOSIS — E1129 Type 2 diabetes mellitus with other diabetic kidney complication: Secondary | ICD-10-CM | POA: Diagnosis not present

## 2018-05-10 DIAGNOSIS — N186 End stage renal disease: Secondary | ICD-10-CM | POA: Diagnosis not present

## 2018-05-12 ENCOUNTER — Encounter (HOSPITAL_COMMUNITY): Payer: Self-pay | Admitting: Internal Medicine

## 2018-05-12 ENCOUNTER — Other Ambulatory Visit: Payer: Self-pay

## 2018-05-12 ENCOUNTER — Inpatient Hospital Stay (HOSPITAL_COMMUNITY)
Admission: EM | Admit: 2018-05-12 | Discharge: 2018-05-18 | DRG: 853 | Disposition: A | Discharge status: Skilled Nursing Facility | Payer: Medicare Other | Attending: Internal Medicine | Admitting: Internal Medicine

## 2018-05-12 ENCOUNTER — Emergency Department (HOSPITAL_COMMUNITY): Payer: Medicare Other

## 2018-05-12 DIAGNOSIS — S98911S Complete traumatic amputation of right foot, level unspecified, sequela: Secondary | ICD-10-CM | POA: Diagnosis not present

## 2018-05-12 DIAGNOSIS — E1152 Type 2 diabetes mellitus with diabetic peripheral angiopathy with gangrene: Secondary | ICD-10-CM | POA: Diagnosis present

## 2018-05-12 DIAGNOSIS — A4159 Other Gram-negative sepsis: Principal | ICD-10-CM | POA: Diagnosis present

## 2018-05-12 DIAGNOSIS — N2581 Secondary hyperparathyroidism of renal origin: Secondary | ICD-10-CM | POA: Diagnosis present

## 2018-05-12 DIAGNOSIS — Z833 Family history of diabetes mellitus: Secondary | ICD-10-CM | POA: Diagnosis not present

## 2018-05-12 DIAGNOSIS — E1122 Type 2 diabetes mellitus with diabetic chronic kidney disease: Secondary | ICD-10-CM | POA: Diagnosis present

## 2018-05-12 DIAGNOSIS — L97519 Non-pressure chronic ulcer of other part of right foot with unspecified severity: Secondary | ICD-10-CM | POA: Diagnosis not present

## 2018-05-12 DIAGNOSIS — I132 Hypertensive heart and chronic kidney disease with heart failure and with stage 5 chronic kidney disease, or end stage renal disease: Secondary | ICD-10-CM | POA: Diagnosis present

## 2018-05-12 DIAGNOSIS — R509 Fever, unspecified: Secondary | ICD-10-CM | POA: Diagnosis not present

## 2018-05-12 DIAGNOSIS — I110 Hypertensive heart disease with heart failure: Secondary | ICD-10-CM | POA: Diagnosis not present

## 2018-05-12 DIAGNOSIS — S91301A Unspecified open wound, right foot, initial encounter: Secondary | ICD-10-CM | POA: Diagnosis not present

## 2018-05-12 DIAGNOSIS — Z794 Long term (current) use of insulin: Secondary | ICD-10-CM

## 2018-05-12 DIAGNOSIS — E46 Unspecified protein-calorie malnutrition: Secondary | ICD-10-CM | POA: Diagnosis present

## 2018-05-12 DIAGNOSIS — G4730 Sleep apnea, unspecified: Secondary | ICD-10-CM | POA: Diagnosis present

## 2018-05-12 DIAGNOSIS — J101 Influenza due to other identified influenza virus with other respiratory manifestations: Secondary | ICD-10-CM | POA: Diagnosis not present

## 2018-05-12 DIAGNOSIS — Z79899 Other long term (current) drug therapy: Secondary | ICD-10-CM | POA: Diagnosis not present

## 2018-05-12 DIAGNOSIS — L03115 Cellulitis of right lower limb: Secondary | ICD-10-CM

## 2018-05-12 DIAGNOSIS — R1111 Vomiting without nausea: Secondary | ICD-10-CM | POA: Diagnosis not present

## 2018-05-12 DIAGNOSIS — N186 End stage renal disease: Secondary | ICD-10-CM | POA: Diagnosis present

## 2018-05-12 DIAGNOSIS — S88111A Complete traumatic amputation at level between knee and ankle, right lower leg, initial encounter: Secondary | ICD-10-CM | POA: Diagnosis not present

## 2018-05-12 DIAGNOSIS — G8918 Other acute postprocedural pain: Secondary | ICD-10-CM | POA: Diagnosis not present

## 2018-05-12 DIAGNOSIS — I10 Essential (primary) hypertension: Secondary | ICD-10-CM

## 2018-05-12 DIAGNOSIS — E669 Obesity, unspecified: Secondary | ICD-10-CM | POA: Diagnosis present

## 2018-05-12 DIAGNOSIS — D631 Anemia in chronic kidney disease: Secondary | ICD-10-CM | POA: Diagnosis present

## 2018-05-12 DIAGNOSIS — B964 Proteus (mirabilis) (morganii) as the cause of diseases classified elsewhere: Secondary | ICD-10-CM | POA: Diagnosis not present

## 2018-05-12 DIAGNOSIS — E119 Type 2 diabetes mellitus without complications: Secondary | ICD-10-CM | POA: Diagnosis not present

## 2018-05-12 DIAGNOSIS — I509 Heart failure, unspecified: Secondary | ICD-10-CM | POA: Diagnosis not present

## 2018-05-12 DIAGNOSIS — I5022 Chronic systolic (congestive) heart failure: Secondary | ICD-10-CM | POA: Diagnosis present

## 2018-05-12 DIAGNOSIS — Z8701 Personal history of pneumonia (recurrent): Secondary | ICD-10-CM

## 2018-05-12 DIAGNOSIS — Z743 Need for continuous supervision: Secondary | ICD-10-CM | POA: Diagnosis not present

## 2018-05-12 DIAGNOSIS — R279 Unspecified lack of coordination: Secondary | ICD-10-CM | POA: Diagnosis not present

## 2018-05-12 DIAGNOSIS — Z9119 Patient's noncompliance with other medical treatment and regimen: Secondary | ICD-10-CM | POA: Diagnosis not present

## 2018-05-12 DIAGNOSIS — I96 Gangrene, not elsewhere classified: Secondary | ICD-10-CM

## 2018-05-12 DIAGNOSIS — R5381 Other malaise: Secondary | ICD-10-CM | POA: Diagnosis not present

## 2018-05-12 DIAGNOSIS — Z992 Dependence on renal dialysis: Secondary | ICD-10-CM | POA: Diagnosis not present

## 2018-05-12 DIAGNOSIS — R05 Cough: Secondary | ICD-10-CM | POA: Diagnosis not present

## 2018-05-12 DIAGNOSIS — Z89511 Acquired absence of right leg below knee: Secondary | ICD-10-CM | POA: Diagnosis not present

## 2018-05-12 DIAGNOSIS — I120 Hypertensive chronic kidney disease with stage 5 chronic kidney disease or end stage renal disease: Secondary | ICD-10-CM | POA: Diagnosis not present

## 2018-05-12 DIAGNOSIS — S88119A Complete traumatic amputation at level between knee and ankle, unspecified lower leg, initial encounter: Secondary | ICD-10-CM

## 2018-05-12 DIAGNOSIS — E1121 Type 2 diabetes mellitus with diabetic nephropathy: Secondary | ICD-10-CM | POA: Diagnosis present

## 2018-05-12 DIAGNOSIS — J159 Unspecified bacterial pneumonia: Secondary | ICD-10-CM | POA: Diagnosis present

## 2018-05-12 DIAGNOSIS — I953 Hypotension of hemodialysis: Secondary | ICD-10-CM | POA: Diagnosis not present

## 2018-05-12 DIAGNOSIS — Z8249 Family history of ischemic heart disease and other diseases of the circulatory system: Secondary | ICD-10-CM | POA: Diagnosis not present

## 2018-05-12 DIAGNOSIS — E1143 Type 2 diabetes mellitus with diabetic autonomic (poly)neuropathy: Secondary | ICD-10-CM | POA: Diagnosis present

## 2018-05-12 DIAGNOSIS — R000 Tachycardia, unspecified: Secondary | ICD-10-CM | POA: Diagnosis not present

## 2018-05-12 DIAGNOSIS — Y95 Nosocomial condition: Secondary | ICD-10-CM | POA: Diagnosis present

## 2018-05-12 DIAGNOSIS — I70261 Atherosclerosis of native arteries of extremities with gangrene, right leg: Secondary | ICD-10-CM | POA: Diagnosis not present

## 2018-05-12 DIAGNOSIS — A419 Sepsis, unspecified organism: Secondary | ICD-10-CM

## 2018-05-12 DIAGNOSIS — E11621 Type 2 diabetes mellitus with foot ulcer: Secondary | ICD-10-CM | POA: Diagnosis present

## 2018-05-12 DIAGNOSIS — L97509 Non-pressure chronic ulcer of other part of unspecified foot with unspecified severity: Secondary | ICD-10-CM

## 2018-05-12 DIAGNOSIS — J189 Pneumonia, unspecified organism: Secondary | ICD-10-CM | POA: Diagnosis not present

## 2018-05-12 DIAGNOSIS — R52 Pain, unspecified: Secondary | ICD-10-CM | POA: Diagnosis not present

## 2018-05-12 DIAGNOSIS — H409 Unspecified glaucoma: Secondary | ICD-10-CM | POA: Diagnosis present

## 2018-05-12 DIAGNOSIS — Z6829 Body mass index (BMI) 29.0-29.9, adult: Secondary | ICD-10-CM

## 2018-05-12 DIAGNOSIS — I444 Left anterior fascicular block: Secondary | ICD-10-CM | POA: Diagnosis present

## 2018-05-12 DIAGNOSIS — J168 Pneumonia due to other specified infectious organisms: Secondary | ICD-10-CM | POA: Diagnosis not present

## 2018-05-12 DIAGNOSIS — R0902 Hypoxemia: Secondary | ICD-10-CM | POA: Diagnosis not present

## 2018-05-12 LAB — COMPREHENSIVE METABOLIC PANEL
ALT: 11 U/L (ref 0–44)
AST: 16 U/L (ref 15–41)
Albumin: 2.7 g/dL — ABNORMAL LOW (ref 3.5–5.0)
Alkaline Phosphatase: 83 U/L (ref 38–126)
Anion gap: 17 — ABNORMAL HIGH (ref 5–15)
BUN: 45 mg/dL — ABNORMAL HIGH (ref 6–20)
CO2: 26 mmol/L (ref 22–32)
Calcium: 8.1 mg/dL — ABNORMAL LOW (ref 8.9–10.3)
Chloride: 90 mmol/L — ABNORMAL LOW (ref 98–111)
Creatinine, Ser: 11.91 mg/dL — ABNORMAL HIGH (ref 0.61–1.24)
GFR calc Af Amer: 5 mL/min — ABNORMAL LOW (ref >60)
GFR calc non Af Amer: 4 mL/min — ABNORMAL LOW (ref >60)
Glucose, Bld: 130 mg/dL — ABNORMAL HIGH (ref 70–99)
Potassium: 4.9 mmol/L (ref 3.5–5.1)
Sodium: 133 mmol/L — ABNORMAL LOW (ref 135–145)
Total Bilirubin: 1 mg/dL (ref 0.3–1.2)
Total Protein: 8.2 g/dL — ABNORMAL HIGH (ref 6.5–8.1)

## 2018-05-12 LAB — LACTIC ACID, PLASMA
Lactic Acid, Venous: 1.4 mmol/L (ref 0.5–1.9)
Lactic Acid, Venous: 1.5 mmol/L (ref 0.5–1.9)

## 2018-05-12 LAB — CBC WITH DIFFERENTIAL/PLATELET
Abs Immature Granulocytes: 0.17 10*3/uL — ABNORMAL HIGH (ref 0.00–0.07)
Basophils Absolute: 0.1 10*3/uL (ref 0.0–0.1)
Basophils Relative: 1 %
Eosinophils Absolute: 0.1 10*3/uL (ref 0.0–0.5)
Eosinophils Relative: 1 %
HCT: 32.2 % — ABNORMAL LOW (ref 39.0–52.0)
Hemoglobin: 10.2 g/dL — ABNORMAL LOW (ref 13.0–17.0)
Immature Granulocytes: 1 %
Lymphocytes Relative: 10 %
Lymphs Abs: 1.5 10*3/uL (ref 0.7–4.0)
MCH: 29.7 pg (ref 26.0–34.0)
MCHC: 31.7 g/dL (ref 30.0–36.0)
MCV: 93.6 fL (ref 80.0–100.0)
Monocytes Absolute: 1.5 10*3/uL — ABNORMAL HIGH (ref 0.1–1.0)
Monocytes Relative: 10 %
Neutro Abs: 12.1 10*3/uL — ABNORMAL HIGH (ref 1.7–7.7)
Neutrophils Relative %: 77 %
Platelets: 280 10*3/uL (ref 150–400)
RBC: 3.44 MIL/uL — ABNORMAL LOW (ref 4.22–5.81)
RDW: 14.6 % (ref 11.5–15.5)
WBC: 15.4 10*3/uL — ABNORMAL HIGH (ref 4.0–10.5)
nRBC: 0 % (ref 0.0–0.2)

## 2018-05-12 LAB — HEMOGLOBIN A1C
Hgb A1c MFr Bld: 8.3 % — ABNORMAL HIGH (ref 4.8–5.6)
Mean Plasma Glucose: 191.51 mg/dL

## 2018-05-12 LAB — MRSA PCR SCREENING: MRSA by PCR: POSITIVE — AB

## 2018-05-12 LAB — GLUCOSE, CAPILLARY
Glucose-Capillary: 132 mg/dL — ABNORMAL HIGH (ref 70–99)
Glucose-Capillary: 102 mg/dL — ABNORMAL HIGH (ref 70–99)

## 2018-05-12 MED ORDER — ENSURE ENLIVE PO LIQD
237.0000 mL | Freq: Three times a day (TID) | ORAL | Status: DC
  Administered 2018-05-12 – 2018-05-18 (×8): 237 mL via ORAL

## 2018-05-12 MED ORDER — PANTOPRAZOLE SODIUM 40 MG PO TBEC
40.0000 mg | DELAYED_RELEASE_TABLET | Freq: Every day | ORAL | Status: DC
  Administered 2018-05-12 – 2018-05-18 (×6): 40 mg via ORAL
  Filled 2018-05-12 (×6): qty 1

## 2018-05-12 MED ORDER — AMLODIPINE BESYLATE 10 MG PO TABS
10.0000 mg | ORAL_TABLET | Freq: Every day | ORAL | Status: DC
  Administered 2018-05-12 – 2018-05-13 (×2): 10 mg via ORAL
  Filled 2018-05-12 (×2): qty 1

## 2018-05-12 MED ORDER — ACETAMINOPHEN 325 MG PO TABS
650.0000 mg | ORAL_TABLET | Freq: Four times a day (QID) | ORAL | Status: DC | PRN
  Administered 2018-05-12 – 2018-05-14 (×4): 650 mg via ORAL
  Filled 2018-05-12 (×4): qty 2

## 2018-05-12 MED ORDER — PIPERACILLIN-TAZOBACTAM 3.375 G IVPB
3.3750 g | Freq: Two times a day (BID) | INTRAVENOUS | Status: DC
  Administered 2018-05-12 (×2): 3.375 g via INTRAVENOUS
  Filled 2018-05-12 (×2): qty 50

## 2018-05-12 MED ORDER — ENSURE PLUS PO LIQD: 237.0000 mL | Freq: Three times a day (TID) | ORAL | Status: DC

## 2018-05-12 MED ORDER — CHLORHEXIDINE GLUCONATE CLOTH 2 % EX PADS
6.0000 | MEDICATED_PAD | Freq: Every day | CUTANEOUS | Status: DC
  Administered 2018-05-13 – 2018-05-14 (×2): 6 via TOPICAL

## 2018-05-12 MED ORDER — INSULIN ASPART 100 UNIT/ML ~~LOC~~ SOLN: 0.0000 [IU] | Freq: Every day | SUBCUTANEOUS | Status: DC

## 2018-05-12 MED ORDER — BRIMONIDINE TARTRATE 0.2 % OP SOLN
1.0000 [drp] | Freq: Two times a day (BID) | OPHTHALMIC | Status: DC
  Administered 2018-05-13 – 2018-05-17 (×9): 1 [drp] via OPHTHALMIC
  Filled 2018-05-12: qty 5

## 2018-05-12 MED ORDER — SEVELAMER CARBONATE 800 MG PO TABS
800.0000 mg | ORAL_TABLET | Freq: Three times a day (TID) | ORAL | Status: DC
  Administered 2018-05-13: 800 mg via ORAL
  Filled 2018-05-12 (×2): qty 1

## 2018-05-12 MED ORDER — METOCLOPRAMIDE HCL 5 MG PO TABS
5.0000 mg | ORAL_TABLET | Freq: Three times a day (TID) | ORAL | Status: DC
  Administered 2018-05-12 – 2018-05-18 (×11): 5 mg via ORAL
  Filled 2018-05-12 (×11): qty 1

## 2018-05-12 MED ORDER — INSULIN ASPART 100 UNIT/ML ~~LOC~~ SOLN
0.0000 [IU] | Freq: Three times a day (TID) | SUBCUTANEOUS | Status: DC
  Administered 2018-05-12 – 2018-05-13 (×2): 1 [IU] via SUBCUTANEOUS
  Administered 2018-05-14 – 2018-05-16 (×4): 2 [IU] via SUBCUTANEOUS
  Administered 2018-05-16: 1 [IU] via SUBCUTANEOUS
  Administered 2018-05-17: 2 [IU] via SUBCUTANEOUS
  Administered 2018-05-17: 1 [IU] via SUBCUTANEOUS
  Administered 2018-05-18: 5 [IU] via SUBCUTANEOUS

## 2018-05-12 MED ORDER — HEPARIN SODIUM (PORCINE) 5000 UNIT/ML IJ SOLN
5000.0000 [IU] | Freq: Three times a day (TID) | INTRAMUSCULAR | Status: DC
  Administered 2018-05-12 – 2018-05-18 (×16): 5000 [IU] via SUBCUTANEOUS
  Filled 2018-05-12 (×16): qty 1

## 2018-05-12 MED ORDER — ONDANSETRON HCL 4 MG PO TABS
4.0000 mg | ORAL_TABLET | Freq: Three times a day (TID) | ORAL | Status: DC | PRN
  Administered 2018-05-12: 4 mg via ORAL
  Filled 2018-05-12: qty 1

## 2018-05-12 MED ORDER — ONDANSETRON HCL 4 MG/2ML IJ SOLN
4.0000 mg | Freq: Once | INTRAMUSCULAR | Status: AC
  Administered 2018-05-12: 4 mg via INTRAVENOUS
  Filled 2018-05-12: qty 2

## 2018-05-12 MED ORDER — ALBUTEROL SULFATE (2.5 MG/3ML) 0.083% IN NEBU: 3.0000 mL | INHALATION_SOLUTION | Freq: Three times a day (TID) | RESPIRATORY_TRACT | Status: DC | PRN

## 2018-05-12 MED ORDER — IPRATROPIUM-ALBUTEROL 0.5-2.5 (3) MG/3ML IN SOLN
3.0000 mL | Freq: Once | RESPIRATORY_TRACT | Status: AC
  Administered 2018-05-12: 3 mL via RESPIRATORY_TRACT
  Filled 2018-05-12: qty 3

## 2018-05-12 MED ORDER — VANCOMYCIN HCL 10 G IV SOLR
2000.0000 mg | Freq: Once | INTRAVENOUS | Status: AC
  Administered 2018-05-12: 2000 mg via INTRAVENOUS
  Filled 2018-05-12 (×2): qty 2000

## 2018-05-12 NOTE — H&P (Signed)
History and Physical    Howard Bell IFO:[id number] DOB: 11/26/1965 DOA: 05/12/2018  PCP: Horald Pollen, MD  Patient coming from: home   I have personally briefly reviewed patient's old medical records available.   Chief Complaint: cough, shortness of breath   HPI: Howard Bell is a 53 y.o. male with medical history significant of type 2 diabetes on insulin, ESRD on Monday Wednesday Friday dialysis, hypertension, diabetic gastroparesis, right foot chronic wound, recently diagnosed influenza A who presents to the emergency room with fever and cough for more than A week.  According to the patient, he has been suffering from this cough and shortness of breath for the more than a month.  Recently started having fever.  He came to ER on 05/02/2018 and was diagnosed with influenza A and was given Tamiflu for 5 days.  Complains of ongoing cough, brownish sputum, low-grade fever, gradually worsening symptoms.  He also has a blackish discolored wound on his right dorsum of the foot and follows up at wound care clinic with Dr. Dellia Nims.  Patient was evaluated by cardiology 2 days ago and this wound was determined to be multifactorial with no evidence of ischemia and suggested conservative management.  He is on oral amoxicillin from wound care clinic.  Patient reports that he felt too sick to go to dialysis center. ED Course: Febrile with temperature 101.7, tachypneic, WBC count 15.4.  Creatinine 11.9.  Blood pressures are stable.  Chest x-ray showed probable right lower lobe pneumonia.  Cultures were drawn.  Patient was given vancomycin and Zosyn in the ER.  Review of Systems: As per HPI otherwise 10 point review of systems negative.    Past Medical History:  Diagnosis Date  . Congestive heart failure (CHF) ([place])   . 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 Diabetic gastroparesis ([place]) 11/06/2017  . ESRD (end stage renal disease) on dialysis ([place])   .  Hypertension     Past Surgical History:  Procedure Laterality Date  . ESOPHAGOGASTRODUODENOSCOPY (EGD) WITH PROPOFOL N/A 12/05/2017   Procedure: ESOPHAGOGASTRODUODENOSCOPY (EGD) WITH PROPOFOL;  Surgeon: Doran Stabler, MD;  Location: [facility];  Service: Gastroenterology;  Laterality: N/A;  . HERNIA REPAIR    . IR AV DIALY SHUNT INTRO NEEDLE/INTRACATH INITIAL W/PTA/IMG LEFT  03/30/2017     reports that he has never smoked. He has never used smokeless tobacco. He reports that he does not drink alcohol or use drugs.  No Known Allergies  Family History  Problem Relation Age of Onset  . Diabetes Mother   . Hypertension Father      Prior to Admission medications   Medication Sig Start Date End Date Taking? Authorizing Provider  amLODipine (NORVASC) 10 MG tablet Take 1 tablet (10 mg total) by mouth daily. 03/30/17  Yes Hongalgi, Lenis Dickinson, MD  amoxicillin-clavulanate (AUGMENTIN) 500-125 MG tablet Take 1 tablet by mouth daily. 05/01/18 05/16/18 Yes [provider]  collagenase (SANTYL) ointment Apply 1 application topically See admin instructions. Apply topically to wound site daily with dressing changes   Yes [provider]  HUMALOG KWIKPEN 100 UNIT/ML KiwkPen INJECT UP TO 9 UNITS DAILY AS DIRECTED PER SLIDING SCALE Patient taking differently: Inject 0-9 Units into the skin 3 (three) times daily. Per sliding scale. 12/13/17  Yes Sagardia, Ines Bloomer, MD  albuterol (PROVENTIL HFA;VENTOLIN HFA) 108 (90 Base) MCG/ACT inhaler Inhale 1 puff into the lungs every 8 (eight) hours as needed for wheezing or shortness of breath.  [provider]  brimonidine (ALPHAGAN) 0.2 % ophthalmic solution Place 1 drop into both eyes 2 (two) times daily.    [provider]  ENSURE PLUS (ENSURE PLUS) LIQD Take 237 mLs by mouth 3 (three) times daily between meals.     [provider]  metoCLOPramide (REGLAN) 5 MG tablet Take 1 tablet (5 mg total) by mouth 3 (three)  times daily before meals. 09/23/17   Nelida Meuse III, MD  metoprolol succinate (TOPROL-XL) 100 MG 24 hr tablet TAKE 1 TABLET BY MOUTH DAILY WITH BREAKFAST, OR IMMEDIATELY FOLLOWING BREAKFAST FOR HTN Patient taking differently: Take 100 mg by mouth daily.  12/13/17   Horald Pollen, MD  omeprazole (PRILOSEC) 40 MG capsule Take 40 mg by mouth daily.  11/12/17   [provider]  ondansetron (ZOFRAN ODT) 8 MG disintegrating tablet Take 1 tablet (8 mg total) by mouth every 8 (eight) hours as needed for nausea or vomiting. 09/20/17   Rutherford Guys, MD  ondansetron (ZOFRAN) 4 MG tablet Take 1 tablet (4 mg total) by mouth every 8 (eight) hours as needed for nausea or vomiting. 05/02/18   Virgel Manifold, MD  sevelamer carbonate (RENVELA) 800 MG tablet TAKE 1 TABLET BY MOUTH DAILY WITH MEALS FOR HYPOCALCEMIA Patient taking differently: Take 800 mg by mouth 3 (three) times daily with meals.  12/13/17   Horald Pollen, MD  timolol (BETIMOL) 0.5 % ophthalmic solution Place 1 drop into both eyes 2 (two) times daily. 05/07/17   Ivar Drape D, PA  travoprost, benzalkonium, (TRAVATAN) 0.004 % ophthalmic solution Place 1 drop into both eyes at bedtime.    [provider]    Physical Exam: Vitals:   05/12/18 0943 05/12/18 1010 05/12/18 1048 05/12/18 1055  BP:    136/67  Pulse:   100 98  Resp:   (!) 21 (!) 26  Temp:  (!) 101.7 F (38.7 C)    TempSrc:  Rectal    SpO2: 100%  98% 99%  Weight:      Height:        Constitutional: NAD, calm, comfortable Vitals:   05/12/18 0943 05/12/18 1010 05/12/18 1048 05/12/18 1055  BP:    136/67  Pulse:   100 98  Resp:   (!) 21 (!) 26  Temp:  (!) 101.7 F (38.7 C)    TempSrc:  Rectal    SpO2: 100%  98% 99%  Weight:      Height:       Eyes: PERRL, lids and conjunctivae normal Chronically sick looking.  Anxious. ENMT: Mucous membranes are moist. Posterior pharynx clear of any exudate or lesions.Normal dentition.  Neck: normal,  supple, no masses, no thyromegaly Respiratory: Mostly bilaterally clear with no added sound.  Normal respiratory effort. No accessory muscle use.  Cardiovascular: Regular rate and rhythm, no murmurs / rubs / gallops. No extremity edema. 2+ pedal pulses. No carotid bruits.  Abdomen: no tenderness, no masses palpated. No hepatosplenomegaly. Bowel sounds positive.  Musculoskeletal: no clubbing / cyanosis. No joint deformity upper and lower extremities. Good ROM, no contractures. Normal muscle tone.  Skin: no rashes, . No induration Neurologic: CN 2-12 grossly intact. Sensation intact, DTR normal. Strength 5/5 in all 4.  Psychiatric: Normal judgment and insight. Alert and oriented x 3.  Anxious. AV fistula left upper extremity with thrill. Healing ulcer left posterior calf with no active drainage. Foul-smelling black discoloration of the front of the foot as below:  Labs on Admission: I have personally reviewed following labs and imaging studies  CBC: Recent Labs  Lab 05/12/18 0950  WBC 15.4*  NEUTROABS 12.1*  HGB 10.2*  HCT 32.2*  MCV 93.6  PLT 809   Basic Metabolic Panel: Recent Labs  Lab 05/12/18 0950  NA 133*  K 4.9  CL 90*  CO2 26  GLUCOSE 130*  BUN 45*  CREATININE 11.91*  CALCIUM 8.1*   GFR: Estimated Creatinine Clearance: 9.1 mL/min (A) (by C-G formula based on SCr of 11.91 mg/dL (H)). Liver Function Tests: Recent Labs  Lab 05/12/18 0950  AST 16  ALT 11  ALKPHOS 83  BILITOT 1.0  PROT 8.2*  ALBUMIN 2.7*   No results for input(s): LIPASE, AMYLASE in the last 168 hours. No results for input(s): AMMONIA in the last 168 hours. Coagulation Profile: No results for input(s): INR, PROTIME in the last 168 hours. Cardiac Enzymes: No results for input(s): CKTOTAL, CKMB, CKMBINDEX, TROPONINI in the last 168 hours. BNP (last 3 results) No results for input(s): PROBNP in the last 8760 hours. HbA1C: No results for input(s): HGBA1C in the last 72  hours. CBG: No results for input(s): GLUCAP in the last 168 hours. Lipid Profile: No results for input(s): CHOL, HDL, LDLCALC, TRIG, CHOLHDL, LDLDIRECT in the last 72 hours. Thyroid Function Tests: No results for input(s): TSH, T4TOTAL, FREET4, T3FREE, THYROIDAB in the last 72 hours. Anemia Panel: No results for input(s): VITAMINB12, FOLATE, FERRITIN, TIBC, IRON, RETICCTPCT in the last 72 hours. Urine analysis:    Component Value Date/Time   COLORURINE RED (A) 11/19/2016 0020   APPEARANCEUR TURBID (A) 11/19/2016 0020   LABSPEC  11/19/2016 0020    TEST NOT REPORTED DUE TO COLOR INTERFERENCE OF URINE PIGMENT   PHURINE  11/19/2016 0020    TEST NOT REPORTED DUE TO COLOR INTERFERENCE OF URINE PIGMENT   GLUCOSEU (A) 11/19/2016 0020    TEST NOT REPORTED DUE TO COLOR INTERFERENCE OF URINE PIGMENT   HGBUR (A) 11/19/2016 0020    TEST NOT REPORTED DUE TO COLOR INTERFERENCE OF URINE PIGMENT   BILIRUBINUR (A) 11/19/2016 0020    TEST NOT REPORTED DUE TO COLOR INTERFERENCE OF URINE PIGMENT   KETONESUR (A) 11/19/2016 0020    TEST NOT REPORTED DUE TO COLOR INTERFERENCE OF URINE PIGMENT   PROTEINUR (A) 11/19/2016 0020    TEST NOT REPORTED DUE TO COLOR INTERFERENCE OF URINE PIGMENT   NITRITE (A) 11/19/2016 0020    TEST NOT REPORTED DUE TO COLOR INTERFERENCE OF URINE PIGMENT   LEUKOCYTESUR (A) 11/19/2016 0020    TEST NOT REPORTED DUE TO COLOR INTERFERENCE OF URINE PIGMENT    Radiological Exams on Admission: Dg Chest 2 View  Result Date: 05/12/2018 CLINICAL DATA:  Productive cough.  Fever and nausea. EXAM: CHEST - 2 VIEW COMPARISON:  Chest x-ray and chest CTs dated 05/02/2018, 11/09/2017 and 11/06/2017 FINDINGS: There is persistent haziness in the right lung base posteriorly consistent with the hazy infiltrate seen on the prior study. There is persistent fullness in the mediastinum, demonstrated to be adenopathy on multiple prior chest CTs. Heart size and pulmonary vascularity are normal. Stent in  the left subclavian vein. Left lung is clear. IMPRESSION: 1. Persistent faint haziness at the right lung base posteriorly. Persistent mediastinal adenopathy. 2. Given the patient's clinical symptoms, the findings at the right lung base could represent recurrent pneumonia in the right lower lobe. 3. The chronic mediastinal adenopathy raises the possibility of sarcoidosis. Electronically Signed   By: Jeneen Rinks  Maxwell M.D.   On: 05/12/2018 10:58   Dg Foot Complete Right  Result Date: 05/12/2018 CLINICAL DATA:  Fever in a diabetic patient with a skin wound on the right foot. EXAM: RIGHT FOOT COMPLETE - 3+ VIEW COMPARISON:  Plain films right foot 04/13/2010 FINDINGS: No bony destructive change or periosteal reaction is identified. There is no fracture or dislocation. No evidence of arthropathy. No soft tissue gas or radiopaque foreign body. Extensive vascular calcifications are noted. IMPRESSION: No acute abnormality. Atherosclerosis. Electronically Signed   By: Inge Rise M.D.   On: 05/12/2018 10:51    EKG: Independently reviewed.  Sinus tachycardia.  LVH.  Comparable to previous EKG from last week.  Assessment/Plan Principal Problem:   Recurrent pneumonia Active Problems:   Anemia due to end stage renal disease (HCC)   Type 2 diabetes mellitus with foot ulcer ([place])   Essential hypertension   ESRD on dialysis ([place])   Influenza A   Pneumonia     1.  Recurrent pneumonia: Agree with admission given severity of symptoms. Antibiotics to treat bacterial pneumonia, will treat as healthcare acquired pneumonia due to recurrent symptoms, recent influenza and dialysis patient. Chest physiotherapy, incentive spirometry, deep breathing exercises, sputum induction, mucolytic's and bronchodilators. Sputum cultures, blood cultures, Legionella and streptococcal antigen. Supplemental oxygen to keep saturations more than 90%.  2.  Type 2 diabetes with foot ulcer: Foul-smelling foot ulcer.  Followed by  wound care.  Adequate vascularization as per recent ABI.  Will discuss with orthopedics whether patient will need any surgical debridement.  Followed by wound care.  Consult wound care inpatient.  3.  Type 2 diabetes: Patient on sliding scale insulin.  Will check A1c.  Continue sliding scale insulin.  4.  Hypertension: Blood pressures are stable.  Resume home medications.  5.  Recent influenza A infection: More than 2 weeks.  Finished Tamiflu.  6.  ESRD on hemodialysis: Patient due for dialysis today.  I consulted nephrology for dialysis need.   DVT prophylaxis: Heparin subcu. Code Status: Full code. Family Communication: No family at bedside. Disposition Plan: Unknown at this time. Consults called: Nephrology.  Orthopedics. Admission status: Inpatient.   Barb Merino MD [facility] Pager [phone]  If 7PM-7AM, please contact night-coverage [email or web address] Password [email or web address]  05/12/2018, 12:12 PM

## 2018-05-12 NOTE — ED Notes (Signed)
Still awaiting vancomycin from pharmacy

## 2018-05-12 NOTE — Consult Note (Signed)
ORTHOPAEDIC CONSULTATION  REQUESTING PHYSICIAN: Barb Merino, MD  Chief Complaint: Dry gangrene dorsum right foot  HPI: Howard Bell is a 53 y.o. male who presents with dry gangrenous ulcer dorsum right foot.  Patient states he has been going to the wound center with Santyl dressing changes.  Patient states that the wound is improving.  Patient is on dialysis with uncontrolled type 2 diabetes with severe peripheral vascular disease.  Past Medical History:  Diagnosis Date  . Congestive heart failure (CHF) ([place])   . 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 Diabetic gastroparesis ([place]) 11/06/2017  . ESRD (end stage renal disease) on dialysis ([place])   . Hypertension    Past Surgical History:  Procedure Laterality Date  . ESOPHAGOGASTRODUODENOSCOPY (EGD) WITH PROPOFOL N/A 12/05/2017   Procedure: ESOPHAGOGASTRODUODENOSCOPY (EGD) WITH PROPOFOL;  Surgeon: Doran Stabler, MD;  Location: [facility];  Service: Gastroenterology;  Laterality: N/A;  . HERNIA REPAIR    . IR AV DIALY SHUNT INTRO NEEDLE/INTRACATH INITIAL W/PTA/IMG LEFT  03/30/2017   Social History   Socioeconomic History  . Marital status: Married    Spouse name: Not on file  . Number of children: Not on file  . Years of education: Not on file  . Highest education level: Not on file  Occupational History  . Not on file  Social Needs  . Financial resource strain: Not on file  . Food insecurity:    Worry: Not on file    Inability: Not on file  . Transportation needs:    Medical: Not on file    Non-medical: Not on file  Tobacco Use  . Smoking status: Never Smoker  . Smokeless tobacco: Never Used  Substance and Sexual Activity  . Alcohol use: No  . Drug use: No  . Sexual activity: Not on file  Lifestyle  . Physical activity:    Days per week: Not on file    Minutes per session: Not on file  . Stress: Not on file  Relationships  . Social connections:    Talks on phone: Not  on file    Gets together: Not on file    Attends religious service: Not on file    Active member of club or organization: Not on file    Attends meetings of clubs or organizations: Not on file    Relationship status: Not on file  Other Topics Concern  . Not on file  Social History Narrative  . Not on file   Family History  Problem Relation Age of Onset  . Diabetes Mother   . Hypertension Father    - negative except otherwise stated in the family history section No Known Allergies Prior to Admission medications   Medication Sig Start Date End Date Taking? Authorizing Provider  amLODipine (NORVASC) 10 MG tablet Take 1 tablet (10 mg total) by mouth daily. 03/30/17  Yes Hongalgi, Lenis Dickinson, MD  amoxicillin-clavulanate (AUGMENTIN) 500-125 MG tablet Take 1 tablet by mouth daily. 05/01/18 05/16/18 Yes [provider]  collagenase (SANTYL) ointment Apply 1 application topically See admin instructions. Apply topically to wound site daily with dressing changes   Yes [provider]  HUMALOG KWIKPEN 100 UNIT/ML KiwkPen INJECT UP TO 9 UNITS DAILY AS DIRECTED PER SLIDING SCALE Patient taking differently: Inject 0-9 Units into the skin 3 (three) times daily. Per sliding scale. 12/13/17  Yes Sagardia, Ines Bloomer, MD  albuterol (PROVENTIL HFA;VENTOLIN HFA) 108 (90 Base) MCG/ACT inhaler Inhale 1  puff into the lungs every 8 (eight) hours as needed for wheezing or shortness of breath.    [provider]  brimonidine (ALPHAGAN) 0.2 % ophthalmic solution Place 1 drop into both eyes 2 (two) times daily.    [provider]  ENSURE PLUS (ENSURE PLUS) LIQD Take 237 mLs by mouth 3 (three) times daily between meals.     [provider]  metoCLOPramide (REGLAN) 5 MG tablet Take 1 tablet (5 mg total) by mouth 3 (three) times daily before meals. 09/23/17   Nelida Meuse III, MD  metoprolol succinate (TOPROL-XL) 100 MG 24 hr tablet TAKE 1 TABLET BY MOUTH DAILY WITH BREAKFAST, OR  IMMEDIATELY FOLLOWING BREAKFAST FOR HTN Patient taking differently: Take 100 mg by mouth daily.  12/13/17   Horald Pollen, MD  omeprazole (PRILOSEC) 40 MG capsule Take 40 mg by mouth daily.  11/12/17   [provider]  ondansetron (ZOFRAN ODT) 8 MG disintegrating tablet Take 1 tablet (8 mg total) by mouth every 8 (eight) hours as needed for nausea or vomiting. 09/20/17   Rutherford Guys, MD  ondansetron (ZOFRAN) 4 MG tablet Take 1 tablet (4 mg total) by mouth every 8 (eight) hours as needed for nausea or vomiting. 05/02/18   Virgel Manifold, MD  sevelamer carbonate (RENVELA) 800 MG tablet TAKE 1 TABLET BY MOUTH DAILY WITH MEALS FOR HYPOCALCEMIA Patient taking differently: Take 800 mg by mouth 3 (three) times daily with meals.  12/13/17   Horald Pollen, MD  timolol (BETIMOL) 0.5 % ophthalmic solution Place 1 drop into both eyes 2 (two) times daily. 05/07/17   Ivar Drape D, PA  travoprost, benzalkonium, (TRAVATAN) 0.004 % ophthalmic solution Place 1 drop into both eyes at bedtime.    [provider]   Dg Chest 2 View  Result Date: 05/12/2018 CLINICAL DATA:  Productive cough.  Fever and nausea. EXAM: CHEST - 2 VIEW COMPARISON:  Chest x-ray and chest CTs dated 05/02/2018, 11/09/2017 and 11/06/2017 FINDINGS: There is persistent haziness in the right lung base posteriorly consistent with the hazy infiltrate seen on the prior study. There is persistent fullness in the mediastinum, demonstrated to be adenopathy on multiple prior chest CTs. Heart size and pulmonary vascularity are normal. Stent in the left subclavian vein. Left lung is clear. IMPRESSION: 1. Persistent faint haziness at the right lung base posteriorly. Persistent mediastinal adenopathy. 2. Given the patient's clinical symptoms, the findings at the right lung base could represent recurrent pneumonia in the right lower lobe. 3. The chronic mediastinal adenopathy raises the possibility of sarcoidosis. Electronically  Signed   By: Lorriane Shire M.D.   On: 05/12/2018 10:58   Dg Foot Complete Right  Result Date: 05/12/2018 CLINICAL DATA:  Fever in a diabetic patient with a skin wound on the right foot. EXAM: RIGHT FOOT COMPLETE - 3+ VIEW COMPARISON:  Plain films right foot 04/13/2010 FINDINGS: No bony destructive change or periosteal reaction is identified. There is no fracture or dislocation. No evidence of arthropathy. No soft tissue gas or radiopaque foreign body. Extensive vascular calcifications are noted. IMPRESSION: No acute abnormality. Atherosclerosis. Electronically Signed   By: Inge Rise M.D.   On: 05/12/2018 10:51   - pertinent xrays, CT, MRI studies were reviewed and independently interpreted  Positive ROS: All other systems have been reviewed and were otherwise negative with the exception of those mentioned in the HPI and as above.  Physical Exam: General: Alert, no acute distress Psychiatric: Patient is competent for consent  with normal mood and affect Lymphatic: No axillary or cervical lymphadenopathy Cardiovascular: No pedal edema Respiratory: No cyanosis, no use of accessory musculature GI: No organomegaly, abdomen is soft and non-tender    Images:  @ENCIMAGES @    Labs:  Lab Results  Component Value Date   HGBA1C 8.5 (A) 04/01/2018   HGBA1C 8.5 01/20/2017   HGBA1C 7.9 (H) 08/04/2016   REPTSTATUS PENDING 05/12/2018   CULT NO GROWTH < 12 HOURS 05/12/2018   LABORGA ESCHERICHIA COLI (A) 11/19/2016    Lab Results  Component Value Date   ALBUMIN 2.7 (L) 05/12/2018   ALBUMIN 2.7 (L) 11/09/2017   ALBUMIN 2.8 (L) 11/07/2017    Neurologic: Patient does not have protective sensation bilateral lower extremities.   MUSCULOSKELETAL:   Skin: On examination patient has a full-thickness dry gangrenous ulcer involving half of the dorsum of the right foot.  Patient's legs are thin and atrophic.  He does not have a palpable dorsalis pedis pulse or posterior tibial pulse.   Patient has uncontrolled type 2 diabetes with a hemoglobin A1c of 8.5 with protein caloric malnutrition with albumin of 2.7.  Radiographs show severe calcification of the vessels within the foot extending out to the toes.  There is a cystic lesion of the fourth toe.  Assessment: Assessment: Diabetic insensate neuropathy with end-stage renal disease on dialysis with severe peripheral vascular disease and protein caloric malnutrition with a extensive gangrenous ulcer over the dorsum of the right foot.  Plan: Plan: I will consult vascular vein surgery to see if revascularization is an option.  With the extensive gangrenous ulcer over the dorsum of the right foot he does not appear to have a foot salvage option.  Patient may benefit from revascularization for a transtibial amputation to heal.  Thank you for the consult and the opportunity to see Howard Bell, [place] [phone] 4:33 PM

## 2018-05-12 NOTE — ED Notes (Signed)
Pt began throwing up bright-yellow colored bile. This tech and Clifton James, EMT, helped clean Pt and notified RN of emesis episode.

## 2018-05-12 NOTE — ED Notes (Signed)
PA able to locate + dorsalis pedis pulse with doppler

## 2018-05-12 NOTE — Progress Notes (Signed)
Patient refusing HD because he "doesn't feel good", charge RN in HD notified.

## 2018-05-12 NOTE — Consult Note (Signed)
WOC consulted for foot wound.  Reviewed images and chart. Patient was evaluated by Dr. 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 Did not see any further notation of plans for surgical debridement.  Followed by wound care center. Would not add any topical therapy outside of non adherent. Would recommend surgical consultation while inpatient.     Re-consult if only topical wound care needed after orthopedic or surgical evaluation Lakisha Peyser [facility] MSN, RN, [place], [place], [place]

## 2018-05-12 NOTE — Consult Note (Signed)
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  The consult is requested by Dr. Meridee Score.   HPI:   Howard Bell is a pleasant 53 y.o. male, who was admitted today by the medical service with cough and shortness of breath.  During this admission he was also noted to have a foul-smelling wound on the right foot.  He has type 2 diabetes and apparently is followed in the wound care center.  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  On my history, the patient tells me that he had a "corn" on his foot that continued to spread and also to mentally became quite extensive.  This was about 3 weeks ago.  He is followed in the wound care center.  He thought that the wound was improving.  He is somewhat difficult to obtain a history from however.  His risk factors for peripheral vascular disease include diabetes and hypertension.  He denies any history of hypercholesterolemia, family history of premature cardiovascular disease, or smoking history.  He does have end-stage renal disease and is on dialysis.  He dialyzes on Monday Wednesdays and Fridays with a left upper arm fistula.  Of note, this patient was seen by Dr. Quay Burow 3 days ago on 05/09/2018.  He had been referred by the wound care center to evaluate the right foot wound.  The patient had Doppler studies performed at Dr. Jenness Corner office on 05/04/2018 which showed that although his vessels were noncompressible he had triphasic waveforms in the tibial vessels and was felt that he had adequate circulation for healing.  Past Medical History:  Diagnosis Date  . Congestive heart failure (CHF) ([place])   . 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 Diabetic gastroparesis ([place]) 11/06/2017  . ESRD (end stage renal disease) on dialysis ([place])   . Hypertension     Family  History  Problem Relation Age of Onset  . Diabetes Mother   . Hypertension Father     SOCIAL HISTORY: Social History   Socioeconomic History  . Marital status: Married    Spouse name: Not on file  . Number of children: Not on file  . Years of education: Not on file  . Highest education level: Not on file  Occupational History  . Not on file  Social Needs  . Financial resource strain: Not on file  . Food insecurity:    Worry: Not on file    Inability: Not on file  . Transportation needs:    Medical: Not on file    Non-medical: Not on file  Tobacco Use  . Smoking status: Never Smoker  . Smokeless tobacco: Never Used  Substance and Sexual Activity  . Alcohol use: No  . Drug use: No  . Sexual activity: Not on file  Lifestyle  . Physical activity:    Days per week: Not on file    Minutes per session: Not on file  . Stress: Not on file  Relationships  . Social connections:    Talks on phone: Not on file    Gets together: Not on file    Attends religious service: Not on file    Active member of club or organization: Not on file    Attends meetings of clubs or organizations: Not on file    Relationship status: Not on file  . Intimate  partner violence:    Fear of current or ex partner: Not on file    Emotionally abused: Not on file    Physically abused: Not on file    Forced sexual activity: Not on file  Other Topics Concern  . Not on file  Social History Narrative  . Not on file    No Known Allergies  Current Facility-Administered Medications  Medication Dose Route Frequency Provider Last Rate Last Dose  . acetaminophen (TYLENOL) tablet 650 mg  650 mg Oral Q6H PRN Barb Merino, MD   650 mg at 05/12/18 1056  . albuterol (PROVENTIL) (2.5 MG/3ML) 0.083% nebulizer solution 3 mL  3 mL Inhalation Q8H PRN Barb Merino, MD      . amLODipine (NORVASC) tablet 10 mg  10 mg Oral Daily Barb Merino, MD   10 mg at 05/12/18 1805  . brimonidine (ALPHAGAN) 0.2 % ophthalmic  solution 1 drop  1 drop Both Eyes BID Barb Merino, MD      . Derrill Memo ON 05/13/2018] Chlorhexidine Gluconate Cloth 2 % PADS 6 each  6 each Topical Q0600 Roney Jaffe, MD      . feeding supplement (ENSURE ENLIVE) (ENSURE ENLIVE) liquid 237 mL  237 mL Oral TID BM Barb Merino, MD      . heparin injection 5,000 Units  5,000 Units Subcutaneous Q8H Barb Merino, MD   5,000 Units at 05/12/18 1805  . insulin aspart (novoLOG) injection 0-5 Units  0-5 Units Subcutaneous QHS Barb Merino, MD      . insulin aspart (novoLOG) injection 0-9 Units  0-9 Units Subcutaneous TID WC Barb Merino, MD   1 Units at 05/12/18 1806  . metoCLOPramide (REGLAN) tablet 5 mg  5 mg Oral TID Pilar Grammes, MD   5 mg at 05/12/18 1805  . ondansetron (ZOFRAN) tablet 4 mg  4 mg Oral Q8H PRN Barb Merino, MD      . pantoprazole (PROTONIX) EC tablet 40 mg  40 mg Oral Daily Barb Merino, MD   40 mg at 05/12/18 1805  . piperacillin-tazobactam (ZOSYN) IVPB 3.375 g  3.375 g Intravenous Q12H Barb Merino, MD   Stopped at 05/12/18 1456  . sevelamer carbonate (RENVELA) tablet 800 mg  800 mg Oral TID WC Barb Merino, MD        REVIEW OF SYSTEMS:  [X]  denotes positive finding, [ ]  denotes negative finding Cardiac  Comments:  Chest pain or chest pressure:    Shortness of breath upon exertion:    Short of breath when lying flat:    Irregular heart rhythm:        Vascular    Pain in calf, thigh, or hip brought on by ambulation:    Pain in feet at night that wakes you up from your sleep:     Blood clot in your veins:    Leg swelling:         Pulmonary    Oxygen at home:    Productive cough:     Wheezing:         Neurologic    Sudden weakness in arms or legs:     Sudden numbness in arms or legs:     Sudden onset of difficulty speaking or slurred speech:    Temporary loss of vision in one eye:     Problems with dizziness:         Gastrointestinal    Blood in stool:     Vomited blood:  Genitourinary    Burning when urinating:     Blood in urine:        Psychiatric    Major depression:         Hematologic    Bleeding problems:    Problems with blood clotting too easily:        Skin    Rashes or ulcers: x  right foot      Constitutional    Fever or chills:     PHYSICAL EXAM:   Vitals:   05/12/18 1237 05/12/18 1300 05/12/18 1500 05/12/18 1622  BP: 117/68 135/82 (!) 142/85 134/83  Pulse: (!) 105 98 87 (!) 108  Resp: (!) 25 19 18 20   Temp: 99.9 F (37.7 C)   98.2 F (36.8 C)  TempSrc: Oral   Oral  SpO2:  96% 96% (!) 89%  Weight:      Height:       GENERAL: The patient is a well-nourished male, in no acute distress. The vital signs are documented above. CARDIAC: There is a regular rate and rhythm.  VASCULAR: I do not detect carotid bruits. On the right side, which is the symptomatic side, he has a palpable femoral pulse.  I cannot palpate popliteal or pedal pulses. On the left side, he has a palpable femoral pulse.  I cannot palpate popliteal or pedal pulses. He has no significant lower extremity swelling. He has an excellent thrill in his left upper arm fistula. PULMONARY: There is good air exchange bilaterally without wheezing or rales. ABDOMEN: Soft and non-tender with normal pitched bowel sounds.  MUSCULOSKELETAL: There are no major deformities or cyanosis. NEUROLOGIC: No focal weakness or paresthesias are detected. SKIN:     PSYCHIATRIC: The patient has a normal affect.  DATA:    LABS: His white blood cell count is 15.4.  Hemoglobin 10.2 platelets 280,000. Potassium is 4.9.  ARTERIAL DOPPLER STUDY: I reviewed the arterial Doppler study that was done on 05/04/2018.  This showed triphasic signals in the dorsalis pedis, posterior tibial, and peroneal positions bilaterally.  The arteries were calcified so ABIs could not be obtained.  Of note, toe pressure on the right was 254 mmHg thus this is not reliable either.  Toe pressure on the left was 232  mmHg.  X-ray of the right foot shows extensive vascular calcification but no bony destructive changes.  CHEST X-RAY: This shows some persistent faint haziness at the right lung base.  There is also mediastinal adenopathy.  Is felt that the findings in the right lung base could represent recurrent pneumonia.  The chronic mediastinal adenopathy raises the possibility of sarcoidosis.  ASSESSMENT & PLAN:   GANGRENOUS WOUND RIGHT FOOT: This patient has a very extensive wound of the right foot as documented above.  I would agree with Dr. Sharol Given that the wound extends to the tendons and bone and that this is not salvageable.  Based on his noninvasive studies that were done this month I think he does have adequate circulation to heal a right below the knee amputation.  I do not think that arteriography is necessary.  Given the extent of the wound I do not think that the wound is salvageable and that for that reason he would require below the knee amputation.  And then based on the noninvasive studies I think he has adequate circulation to heal this without obtaining arteriogram.  Vascular surgery will be available as needed.   Deitra Mayo [facility] [phone]

## 2018-05-12 NOTE — ED Notes (Signed)
Spoke with [employer], pharmacist in regards to vancomycin still not here ; per [facility] will check on it and call me back

## 2018-05-12 NOTE — ED Triage Notes (Addendum)
Pt brought in by ems for c/o body aches/ productive cough/ x 5 weeks ; pt also c/o fevers and nausea that began this morning ; pt states he has been seeing the wound center for his right foot and states the last time he went was last week ; necrosis noted to the right foot with strong odor but states the wound center knows about it ; pt recently diagnosed with flu 2/11; pt a HD patient with a M,W,F schedule , patient states he went to HD this past wednesday

## 2018-05-12 NOTE — ED Provider Notes (Signed)
[facility] EMERGENCY DEPARTMENT Provider Note   CSN: [id number] Arrival date & time: 05/12/18  2952    History   Chief Complaint Chief Complaint  Patient presents with  . Wound Infection  . Fever  . Cough    HPI Howard Bell is a 53 y.o. male.     The history is provided by the patient.  Cough  Cough characteristics:  Non-productive Sputum characteristics:  [place] Severity:  Moderate Onset quality:  Gradual Duration:  5 weeks Timing:  Constant Progression:  Worsening Chronicity:  New Smoker: no   Relieved by:  Nothing Worsened by:  Nothing Ineffective treatments:  None tried Associated symptoms: fever   Risk factors: no recent infection   Pt reports he goes to dialysis on MWF, Pt to sick to got today.  Pt had a fever.  Pt has had a cough for 5 weeks.  Pt reports he is going to wound care.  Pt has a black foul smelling wound on his foot   Past Medical History:  Diagnosis Date  . Congestive heart failure (CHF) ([place])   . 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 Diabetic gastroparesis ([place]) 11/06/2017  . ESRD (end stage renal disease) on dialysis ([place])   . Hypertension     Patient Active Problem List   Diagnosis Date Noted  . Critical limb ischemia with history of revascularization of same extremity 05/09/2018  . LUQ pain   . 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 ([place]) 11/16/2017  . CKD stage 5 due to type 2 diabetes mellitus ([place]) 11/16/2017  . Glaucoma due to type 2 diabetes mellitus ([place]) 11/16/2017  . Chronic generalized pain 11/16/2017  . Multifocal pneumonia 11/06/2017  . Diabetic gastroparesis ([place]) 11/06/2017  . Generalized abdominal pain 09/13/2017  . Acute pain of right shoulder 07/26/2017  . Acute bursitis of right shoulder 07/26/2017  . Left arm swelling 03/28/2017   . ESRD on dialysis ([place]) 03/28/2017  . Chest pain 11/26/2016  . Essential hypertension 11/26/2016  . Anemia due to end stage renal disease ([place]) 08/04/2016  . Acute respiratory failure with hypoxemia ([place]) 08/04/2016  . Uncontrolled type 2 diabetes mellitus with ESRD (end-stage renal disease) ([place]) 08/04/2016  . Acute CHF (congestive heart failure) ([place]) 08/04/2016  . Elevated troponin     Past Surgical History:  Procedure Laterality Date  . ESOPHAGOGASTRODUODENOSCOPY (EGD) WITH PROPOFOL N/A 12/05/2017   Procedure: ESOPHAGOGASTRODUODENOSCOPY (EGD) WITH PROPOFOL;  Surgeon: Doran Stabler, MD;  Location: [facility] ENDOSCOPY;  Service: Gastroenterology;  Laterality: N/A;  . HERNIA REPAIR    . IR AV DIALY SHUNT INTRO NEEDLE/INTRACATH INITIAL W/PTA/IMG LEFT  03/30/2017        Home Medications    Prior to Admission medications   Medication Sig Start Date End Date Taking? Authorizing Provider  albuterol (PROVENTIL HFA;VENTOLIN HFA) 108 (90 Base) MCG/ACT inhaler Inhale 1 puff into the lungs every 8 (eight) hours as needed for wheezing or shortness of breath.    [provider]  amLODipine (NORVASC) 10 MG tablet Take 1 tablet (10 mg total) by mouth daily. 03/30/17   Hongalgi, Lenis Dickinson, MD  amoxicillin-clavulanate (AUGMENTIN) 500-125 MG tablet Take 1 tablet by mouth daily. 05/01/18 05/16/18  [provider]  brimonidine (ALPHAGAN) 0.2 % ophthalmic solution Place 1 drop into both eyes 2 (two) times daily.  [provider]  ENSURE PLUS (ENSURE PLUS) LIQD Take 237 mLs by mouth 3 (three) times daily between meals.     [provider]  HUMALOG KWIKPEN 100 UNIT/ML KiwkPen INJECT UP TO 9 UNITS DAILY AS DIRECTED PER SLIDING SCALE 12/13/17   Sagardia, Ines Bloomer, MD  metoCLOPramide (REGLAN) 5 MG tablet Take 1 tablet (5 mg total) by mouth 3 (three) times daily before meals. 09/23/17   Nelida Meuse III, MD  metoprolol succinate (TOPROL-XL) 100 MG 24 hr tablet TAKE 1 TABLET BY  MOUTH DAILY WITH BREAKFAST, OR IMMEDIATELY FOLLOWING BREAKFAST FOR HTN 12/13/17   Horald Pollen, MD  omeprazole (PRILOSEC) 40 MG capsule Take 40 mg by mouth daily. 11/12/17   [provider]  ondansetron (ZOFRAN ODT) 8 MG disintegrating tablet Take 1 tablet (8 mg total) by mouth every 8 (eight) hours as needed for nausea or vomiting. 09/20/17   Rutherford Guys, MD  ondansetron (ZOFRAN) 4 MG tablet Take 1 tablet (4 mg total) by mouth every 8 (eight) hours as needed for nausea or vomiting. 05/02/18   Virgel Manifold, MD  sevelamer carbonate (RENVELA) 800 MG tablet TAKE 1 TABLET BY MOUTH DAILY WITH MEALS FOR HYPOCALCEMIA 12/13/17   Sagardia, Ines Bloomer, MD  timolol (BETIMOL) 0.5 % ophthalmic solution Place 1 drop into both eyes 2 (two) times daily. 05/07/17   Ivar Drape D, PA  travoprost, benzalkonium, (TRAVATAN) 0.004 % ophthalmic solution Place 1 drop into both eyes at bedtime.    [provider]    Family History Family History  Problem Relation Age of Onset  . Diabetes Mother   . Hypertension Father     Social History Social History   Tobacco Use  . Smoking status: Never Smoker  . Smokeless tobacco: Never Used  Substance Use Topics  . Alcohol use: No  . Drug use: No     Allergies   Patient has no known allergies.   Review of Systems Review of Systems  Constitutional: Positive for fever.  Respiratory: Positive for cough.      Physical Exam Updated Vital Signs BP 124/72   Pulse (!) 104   Temp (!) 101.1 F (38.4 C) (Oral)   Resp (!) 21   Ht 6' (1.829 m)   Wt 99.5 kg   SpO2 100%   BMI 29.75 kg/m   Physical Exam Vitals signs and nursing note reviewed.  HENT:     Head: Normocephalic.     Right Ear: Tympanic membrane normal.     Left Ear: Tympanic membrane normal.     Nose: Nose normal.     Mouth/Throat:     Mouth: Mucous membranes are moist.  Neck:     Musculoskeletal: Normal range of motion.  Cardiovascular:     Rate and Rhythm:  Normal rate.     Pulses: Normal pulses.  Pulmonary:     Breath sounds: Wheezing and rhonchi present.  Abdominal:     General: Bowel sounds are normal.  Musculoskeletal: Normal range of motion.        General: Tenderness present.  Skin:    General: Skin is warm.  Neurological:     General: No focal deficit present.     Mental Status: He is alert.  Psychiatric:        Mood and Affect: Mood normal.    MDM  Pt meets criteria for sepsis.  Iv started,  Pharmacist consulted for antibiotics.   ED Treatments / Results  Labs (all labs ordered  are listed, but only abnormal results are displayed) Labs Reviewed  CBC WITH DIFFERENTIAL/PLATELET - Abnormal; Notable for the following components:      Result Value   WBC 15.4 (*)    RBC 3.44 (*)    Hemoglobin 10.2 (*)    HCT 32.2 (*)    Neutro Abs 12.1 (*)    Monocytes Absolute 1.5 (*)    Abs Immature Granulocytes 0.17 (*)    All other components within normal limits  COMPREHENSIVE METABOLIC PANEL - Abnormal; Notable for the following components:   Sodium 133 (*)    Chloride 90 (*)    Glucose, Bld 130 (*)    BUN 45 (*)    Creatinine, Ser 11.91 (*)    Calcium 8.1 (*)    Total Protein 8.2 (*)    Albumin 2.7 (*)    GFR calc non Af Amer 4 (*)    GFR calc Af Amer 5 (*)    Anion gap 17 (*)    All other components within normal limits  CULTURE, BLOOD (ROUTINE X 2)  CULTURE, BLOOD (ROUTINE X 2)  LACTIC ACID, PLASMA  URINALYSIS, ROUTINE W REFLEX MICROSCOPIC  LACTIC ACID, PLASMA    EKG None  Radiology Dg Chest 2 View  Result Date: 05/12/2018 CLINICAL DATA:  Productive cough.  Fever and nausea. EXAM: CHEST - 2 VIEW COMPARISON:  Chest x-ray and chest CTs dated 05/02/2018, 11/09/2017 and 11/06/2017 FINDINGS: There is persistent haziness in the right lung base posteriorly consistent with the hazy infiltrate seen on the prior study. There is persistent fullness in the mediastinum, demonstrated to be adenopathy on multiple prior chest CTs.  Heart size and pulmonary vascularity are normal. Stent in the left subclavian vein. Left lung is clear. IMPRESSION: 1. Persistent faint haziness at the right lung base posteriorly. Persistent mediastinal adenopathy. 2. Given the patient's clinical symptoms, the findings at the right lung base could represent recurrent pneumonia in the right lower lobe. 3. The chronic mediastinal adenopathy raises the possibility of sarcoidosis. Electronically Signed   By: Lorriane Shire M.D.   On: 05/12/2018 10:58   Dg Foot Complete Right  Result Date: 05/12/2018 CLINICAL DATA:  Fever in a diabetic patient with a skin wound on the right foot. EXAM: RIGHT FOOT COMPLETE - 3+ VIEW COMPARISON:  Plain films right foot 04/13/2010 FINDINGS: No bony destructive change or periosteal reaction is identified. There is no fracture or dislocation. No evidence of arthropathy. No soft tissue gas or radiopaque foreign body. Extensive vascular calcifications are noted. IMPRESSION: No acute abnormality. Atherosclerosis. Electronically Signed   By: Inge Rise M.D.   On: 05/12/2018 10:51    Procedures .Critical Care Performed by: Fransico Meadow, PA-C Authorized by: Fransico Meadow, PA-C   Critical care provider statement:    Critical care time (minutes):  45   Critical care start time:  05/12/2018 9:25 AM   Critical care end time:  05/12/2018 11:25 AM   Critical care time was exclusive of:  Separately billable procedures and treating other patients and teaching time   Critical care was time spent personally by me on the following activities:  Blood draw for specimens, evaluation of patient's response to treatment, interpretation of cardiac output measurements, obtaining history from patient or surrogate, re-evaluation of patient's condition, ordering and review of laboratory studies, ordering and performing treatments and interventions, pulse oximetry, ordering and review of radiographic studies and review of old charts    (including critical care time)  Medications Ordered in  ED Medications - No data to display   Initial Impression / Assessment and Plan / ED Course  I have reviewed the triage vital signs and the nursing notes.  Pertinent labs & imaging results that were available during my care of the patient were reviewed by me and considered in my medical decision making (see chart for details).  Clinical Course as of May 12 1116  Fri May 12, 2018  1105 DG Foot Complete Right [LS]    Clinical Course User Index [LS] Fransico Meadow, [place]       MDM  Iv ascess obtain. Pt given IV antibiotics  Xray shows pneumonia.    I spoke to Dr. Sloan Leiter Hospitalist who will see for admission  Final Clinical Impressions(s) / ED Diagnoses   Final diagnoses:  Community acquired pneumonia of right lung, unspecified part of lung  Cellulitis of right lower extremity  Pneumonia of both lungs due to infectious organism, unspecified part of lung  Cellulitis of foot, right  Sepsis, due to unspecified organism, unspecified whether acute organ dysfunction present [facility])    ED Discharge Orders    None       Fransico Meadow, [place] 05/12/18 1223    Veryl Speak, MD 05/12/18 1420

## 2018-05-13 DIAGNOSIS — D631 Anemia in chronic kidney disease: Secondary | ICD-10-CM

## 2018-05-13 DIAGNOSIS — A4159 Other Gram-negative sepsis: Principal | ICD-10-CM

## 2018-05-13 LAB — BLOOD CULTURE ID PANEL (REFLEXED)

## 2018-05-13 LAB — RENAL FUNCTION PANEL
Albumin: 2.3 g/dL — ABNORMAL LOW (ref 3.5–5.0)
Anion gap: 19 — ABNORMAL HIGH (ref 5–15)
BUN: 62 mg/dL — ABNORMAL HIGH (ref 6–20)
CO2: 25 mmol/L (ref 22–32)
Calcium: 8.2 mg/dL — ABNORMAL LOW (ref 8.9–10.3)
Chloride: 88 mmol/L — ABNORMAL LOW (ref 98–111)
Creatinine, Ser: 13.79 mg/dL — ABNORMAL HIGH (ref 0.61–1.24)
GFR calc Af Amer: 4 mL/min — ABNORMAL LOW (ref >60)
GFR calc non Af Amer: 4 mL/min — ABNORMAL LOW (ref >60)
Glucose, Bld: 203 mg/dL — ABNORMAL HIGH (ref 70–99)
Phosphorus: 8.8 mg/dL — ABNORMAL HIGH (ref 2.5–4.6)
Potassium: 5 mmol/L (ref 3.5–5.1)
Sodium: 132 mmol/L — ABNORMAL LOW (ref 135–145)

## 2018-05-13 LAB — GLUCOSE, CAPILLARY
Glucose-Capillary: 118 mg/dL — ABNORMAL HIGH (ref 70–99)
Glucose-Capillary: 126 mg/dL — ABNORMAL HIGH (ref 70–99)
Glucose-Capillary: 189 mg/dL — ABNORMAL HIGH (ref 70–99)

## 2018-05-13 LAB — CBC
HCT: 31 % — ABNORMAL LOW (ref 39.0–52.0)
Hemoglobin: 10 g/dL — ABNORMAL LOW (ref 13.0–17.0)
MCH: 29.8 pg (ref 26.0–34.0)
MCHC: 32.3 g/dL (ref 30.0–36.0)
MCV: 92.3 fL (ref 80.0–100.0)
Platelets: 247 10*3/uL (ref 150–400)
RBC: 3.36 MIL/uL — ABNORMAL LOW (ref 4.22–5.81)
RDW: 14.5 % (ref 11.5–15.5)
WBC: 18.9 10*3/uL — ABNORMAL HIGH (ref 4.0–10.5)
nRBC: 0 % (ref 0.0–0.2)

## 2018-05-13 LAB — HIV ANTIBODY (ROUTINE TESTING W REFLEX): HIV Screen 4th Generation wRfx: NONREACTIVE

## 2018-05-13 MED ORDER — ALTEPLASE 2 MG IJ SOLR: 2.0000 mg | Freq: Once | INTRAMUSCULAR | Status: DC | PRN

## 2018-05-13 MED ORDER — LIDOCAINE-PRILOCAINE 2.5-2.5 % EX CREA: 1.0000 "application " | TOPICAL_CREAM | CUTANEOUS | Status: DC | PRN

## 2018-05-13 MED ORDER — DOXERCALCIFEROL 4 MCG/2ML IV SOLN
7.0000 ug | Freq: Once | INTRAVENOUS | Status: AC
  Administered 2018-05-13: 7 ug via INTRAVENOUS
  Filled 2018-05-13: qty 4

## 2018-05-13 MED ORDER — HEPARIN SODIUM (PORCINE) 1000 UNIT/ML DIALYSIS: 1000.0000 [IU] | INTRAMUSCULAR | Status: DC | PRN

## 2018-05-13 MED ORDER — DOXERCALCIFEROL 4 MCG/2ML IV SOLN
7.0000 ug | INTRAVENOUS | Status: DC
  Administered 2018-05-15: 7 ug via INTRAVENOUS
  Filled 2018-05-13: qty 4

## 2018-05-13 MED ORDER — RENA-VITE PO TABS
1.0000 | ORAL_TABLET | Freq: Every day | ORAL | Status: DC
  Administered 2018-05-13 – 2018-05-17 (×5): 1 via ORAL
  Filled 2018-05-13 (×5): qty 1

## 2018-05-13 MED ORDER — CALCIUM ACETATE (PHOS BINDER) 667 MG PO CAPS
2668.0000 mg | ORAL_CAPSULE | Freq: Three times a day (TID) | ORAL | Status: DC
  Administered 2018-05-13 – 2018-05-18 (×9): 2668 mg via ORAL
  Filled 2018-05-13 (×9): qty 4

## 2018-05-13 MED ORDER — SODIUM CHLORIDE 0.9 % IV SOLN
2.0000 g | INTRAVENOUS | Status: DC
  Administered 2018-05-13 – 2018-05-14 (×2): 2 g via INTRAVENOUS
  Filled 2018-05-13 (×2): qty 20

## 2018-05-13 MED ORDER — PRO-STAT SUGAR FREE PO LIQD
30.0000 mL | Freq: Two times a day (BID) | ORAL | Status: DC
  Administered 2018-05-13 – 2018-05-18 (×10): 30 mL via ORAL
  Filled 2018-05-13 (×10): qty 30

## 2018-05-13 MED ORDER — DOXERCALCIFEROL 4 MCG/2ML IV SOLN
INTRAVENOUS | Status: AC
  Administered 2018-05-13: 7 ug via INTRAVENOUS
  Filled 2018-05-13: qty 4

## 2018-05-13 MED ORDER — LIDOCAINE HCL (PF) 1 % IJ SOLN: 5.0000 mL | INTRAMUSCULAR | Status: DC | PRN

## 2018-05-13 MED ORDER — HEPARIN SODIUM (PORCINE) 1000 UNIT/ML IJ SOLN
INTRAMUSCULAR | Status: AC
  Administered 2018-05-13: 9000 [IU] via INTRAVENOUS_CENTRAL
  Filled 2018-05-13: qty 9

## 2018-05-13 MED ORDER — TRAZODONE HCL 50 MG PO TABS
50.0000 mg | ORAL_TABLET | Freq: Once | ORAL | Status: AC
  Administered 2018-05-15: 50 mg via ORAL
  Filled 2018-05-13: qty 1

## 2018-05-13 MED ORDER — AZITHROMYCIN 500 MG PO TABS
500.0000 mg | ORAL_TABLET | Freq: Every day | ORAL | Status: DC
  Administered 2018-05-13: 500 mg via ORAL
  Filled 2018-05-13: qty 1

## 2018-05-13 MED ORDER — SODIUM CHLORIDE 0.9 % IV SOLN: 100.0000 mL | INTRAVENOUS | Status: DC | PRN

## 2018-05-13 MED ORDER — PENTAFLUOROPROP-TETRAFLUOROETH EX AERO: 1.0000 "application " | INHALATION_SPRAY | CUTANEOUS | Status: DC | PRN

## 2018-05-13 MED ORDER — HEPARIN SODIUM (PORCINE) 1000 UNIT/ML DIALYSIS
9000.0000 [IU] | INTRAMUSCULAR | Status: DC | PRN
  Administered 2018-05-13: 9000 [IU] via INTRAVENOUS_CENTRAL
  Filled 2018-05-13: qty 9

## 2018-05-13 NOTE — Consult Note (Signed)
Renal Service Consult Note [facility]  Howard Bell 05/13/2018 Howard Bell Requesting Physician:  Dr Howard Bell  Reason for Consult:  ESRD pt w/ fever HPI: The patient is a 53 y.o. year-old w/ hx of poorly cont DM2, HTN and ESRD on HD since 2008.  Presented to ED with fever and cough x 1 week, recently dx'd with flu A in OP setting. Felt to sick to go to OP HD today.   Patient denies CP, SOB, orthopnea.  CXR shows vasc congestion, no overt edema.  Temp in ED 101F, WBC 15k.  Pt admitted, started on IV abx and consults to vasc surg and ortho are planned.  CXR possible RML infiltrate.   ROS  denies CP  no joint pain   no HA  no blurry vision  no rash  no diarrhea  no nausea/ vomiting   Past Medical History  Past Medical History:  Diagnosis Date  . Congestive heart failure (CHF) ([place])   . 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 Diabetic gastroparesis ([place]) 11/06/2017  . ESRD (end stage renal disease) on dialysis ([place])   . Hypertension    Past Surgical History  Past Surgical History:  Procedure Laterality Date  . ESOPHAGOGASTRODUODENOSCOPY (EGD) WITH PROPOFOL N/A 12/05/2017   Procedure: ESOPHAGOGASTRODUODENOSCOPY (EGD) WITH PROPOFOL;  Surgeon: Howard Stabler, MD;  Location: [facility] END[facility]COPY;  Service: Gastroenterology;  Laterality: N/A;  . HERNIA REPAIR    . IR AV DIALY SHUNT INTRO NEEDLE/INTRACATH INITIAL W/PTA/IMG LEFT  03/30/2017   Family History  Family History  Problem Relation Age of Onset  . Diabetes Mother   . Hypertension Father    Social History  reports that he has never smoked. He has never used smokeless tobacco. He reports that he does not drink alcohol or use drugs. Allergies No Known Allergies Home medications Prior to Admission medications   Medication Sig Start Date End Date Taking? Authorizing Provider  amLODipine (NORVASC) 10 MG tablet Take 1 tablet (10 mg total) by mouth daily. 03/30/17  Yes Hongalgi,  Lenis Dickinson, MD  amoxicillin-clavulanate (AUGMENTIN) 500-125 MG tablet Take 1 tablet by mouth daily. 05/01/18 05/16/18 Yes [provider]  collagenase (SANTYL) ointment Apply 1 application topically See admin instructions. Apply topically to wound site daily with dressing changes   Yes [provider]  HUMALOG KWIKPEN 100 UNIT/ML KiwkPen INJECT UP TO 9 UNITS DAILY AS DIRECTED PER SLIDING SCALE Patient taking differently: Inject 0-9 Units into the skin 3 (three) times daily. Per sliding scale. 12/13/17  Yes Sagardia, Ines Bloomer, MD  albuterol (PROVENTIL HFA;VENTOLIN HFA) 108 (90 Base) MCG/ACT inhaler Inhale 1 puff into the lungs every 8 (eight) hours as needed for wheezing or shortness of breath.    [provider]  brimonidine (ALPHAGAN) 0.2 % ophthalmic solution Place 1 drop into both eyes 2 (two) times daily.    [provider]  ENSURE PLUS (ENSURE PLUS) LIQD Take 237 mLs by mouth 3 (three) times daily between meals.     [provider]  metoCLOPramide (REGLAN) 5 MG tablet Take 1 tablet (5 mg total) by mouth 3 (three) times daily before meals. 09/23/17   Howard Meuse III, MD  metoprolol succinate (TOPROL-XL) 100 MG 24 hr tablet TAKE 1 TABLET BY MOUTH DAILY WITH BREAKFAST, OR IMMEDIATELY FOLLOWING BREAKFAST FOR HTN Patient taking differently: Take 100 mg by mouth daily.  12/13/17   Howard Pollen, MD  omeprazole (PRILOSEC) 40 MG capsule Take  40 mg by mouth daily.  11/12/17   [provider]  ondansetron (ZOFRAN ODT) 8 MG disintegrating tablet Take 1 tablet (8 mg total) by mouth every 8 (eight) hours as needed for nausea or vomiting. 09/20/17   Howard Guys, MD  ondansetron (ZOFRAN) 4 MG tablet Take 1 tablet (4 mg total) by mouth every 8 (eight) hours as needed for nausea or vomiting. 05/02/18   Howard Manifold, MD  sevelamer carbonate (RENVELA) 800 MG tablet TAKE 1 TABLET BY MOUTH DAILY WITH MEALS FOR HYPOCALCEMIA Patient taking differently: Take  800 mg by mouth 3 (three) times daily with meals.  12/13/17   Howard Pollen, MD  timolol (BETIMOL) 0.5 % ophthalmic solution Place 1 drop into both eyes 2 (two) times daily. 05/07/17   Howard Bell  travoprost, benzalkonium, (TRAVATAN) 0.004 % ophthalmic solution Place 1 drop into both eyes at bedtime.    [provider]   Liver Function Tests Recent Labs  Lab 05/12/18 0950  AST 16  ALT 11  ALKPHOS 83  BILITOT 1.0  PROT 8.2*  ALBUMIN 2.7*   No results for input(s): LIPASE, AMYLASE in the last 168 hours. CBC Recent Labs  Lab 05/12/18 0950  WBC 15.4*  NEUTROABS 12.1*  HGB 10.2*  HCT 32.2*  MCV 93.6  PLT 212   Basic Metabolic Panel Recent Labs  Lab 05/12/18 0950  NA 133*  K 4.9  CL 90*  CO2 26  GLUCOSE 130*  BUN 45*  CREATININE 11.91*  CALCIUM 8.1*   Iron/TIBC/Ferritin/ %Sat No results found for: IRON, TIBC, FERRITIN, IRONPCTSAT  Vitals:   05/12/18 1622 05/12/18 2020 05/12/18 2219 05/13/18 0345  BP: 134/83 115/80  (!) 136/97  Pulse: (!) 108 (!) 118 (!) 106 95  Resp: 20 18  20   Temp: 98.2 F (36.8 C) 98.9 F (37.2 C)  98.5 F (36.9 C)  TempSrc: Oral Oral  Oral  SpO2: (!) 89% (!) 83% 96% 91%  Weight:  99.1 kg    Height:       Exam Gen alert, no distress No rash, cyanosis or gangrene Sclera anicteric, throat clear  No jvd or bruits Chest clear bilat to bases RRR no MRG Abd soft ntnd no mass or ascites +bs obese GU normal male MS no joint effusions or deformity Ext no LE or UE edema, large dry gangrene lesion on top of R foot Neuro is alert, Ox 3 , nf  L arm AVG+bruit    Home meds:  - amlodipine 10  - humalog kwikpen tid ac  - sevelamer carb ac tid/ omeprazole 40 qd/ metoclopramide 5 tid  - prn albuterol/ ondansetron  - eyedrops/ amoxicillin - clavulanate 500-125 qd    MWF [facility] (2008)  4h 65min 450/ 2.0  97.5kg  2/2.25 bath  Hep 9000  L AVG  - hect 7 ug tiw  - etelcalcetide 10mg  tiw IV       Assessment: 1. Fevers - r/o PNA, per primary 2. R foot gangrene: significant lesions 3. Recent flu A 4. ESRD on HD 5. Volume - no sig resp issues, borderline wet on CXR 6. HTN 7. DM2 on insulin 8. MBD ckd - Ca 8 9. Anemia ckd - hb 10    P: 1. HD later tonight or could postpone to tomorrow.  Will follow.     [facility] 05/12/2018, 04:25 PM

## 2018-05-13 NOTE — Progress Notes (Addendum)
   05/13/18 2115  What Happened  Was fall witnessed? Yes  Who witnessed fall? Iris Richards  Patients activity before fall bathroom-assisted  Point of contact buttocks  Was patient injured? No  Follow Up  MD notified Blount, NP  Time MD notified [id number]  Family notified No- patient refusal  Additional tests No  Progress note created (see row info) Yes  Adult Fall Risk Assessment  Risk Factor Category (scoring not indicated) High fall risk per protocol (document High fall risk);Fall has occurred during this admission (document High fall risk)  Patient Fall Risk Level High fall risk  Adult Fall Risk Interventions  Required Bundle Interventions *See Row Information* High fall risk - low, moderate, and high requirements implemented  Additional Interventions Use of appropriate toileting equipment (bedpan, BSC, etc.)  Screening for Fall Injury Risk (To be completed on HIGH fall risk patients) - Assessing Need for Low Bed  Risk For Fall Injury- Low Bed Criteria Previous fall this admission  Will Implement Low Bed and Floor Mats Yes  Screening for Fall Injury Risk (To be completed on HIGH fall risk patients who do not meet crieteria for Low Bed) - Assessing Need for Floor Mats Only  Risk For Fall Injury- Criteria for Floor Mats None identified - No additional interventions needed  Pain Assessment  Pain Scale 0-10  Pain Score 0  Neurological  Neuro (WDL) WDL  Level of Consciousness Alert  Neuro Additional Assessments No  Musculoskeletal  Musculoskeletal (WDL) X  Assistive Device None  Generalized Weakness Yes  Weight Bearing Restrictions No  Integumentary  Integumentary (WDL) X  Skin Color Appropriate for ethnicity  Skin Condition Dry;Flaky  Skin Integrity Other (Comment) (callus on R fifth digit, redness on sacrum-blanchable)  Abrasion Location Leg  Abrasion Location Orientation Right  Abrasion Intervention Other (Comment) (assessed)  Skin Turgor Non-tenting  Pain Screening   Clinical Progression Not changed  Pain Assessment  Work-Related Injury No   No suspected head injury. Patient assisted to the floor by NT upon getting up from the toilet. Patient called out for help and patient assisted to the bed with NT and additional staff. Patient does not complain of pain anywhere. Patients bed alarm turned on, yellow socks and yellow arm band applied. Patient placed on a low bed. RN will continue to monitor patient.  Ermalinda Memos, RN

## 2018-05-13 NOTE — Progress Notes (Signed)
PROGRESS NOTE  Howard Bell DJS:[id number] DOB: Jan 16, 1966 DOA: 05/12/2018 PCP: Horald Pollen, MD   LOS: 1 day   Brief Narrative / Interim history: 53 year old male with history of type 2 diabetes mellitus on insulin, ESRD on MWF HD, hypertension, gastroparesis, chronic right foot wound, recently diagnosed with flu a status post treatment couple weeks ago presents to the hospital with febrile illness.  He was complaining of ongoing cough with sputum production as well as worsening wound which turned black on the right foot.  He was septic in the ED with fever of one 1.7, white count of 15, chest x-ray showed probable right lower lobe pneumonia.  He was admitted to the hospital.  Subjective: Sleeping this morning, not that interactive but tells me that he is continued to feel little bit congested and coughing.  He denies any abdominal pain, nausea or vomiting.  Assessment & Plan: Principal Problem:   Recurrent pneumonia Active Problems:   Anemia due to end stage renal disease (HCC)   Type 2 diabetes mellitus with foot ulcer (HCC)   Essential hypertension   ESRD on dialysis ([place])   Influenza A   Pneumonia   Gangrene of right foot ([place])   Principal Problem Sepsis due to Proteus bacteremia -Patient was initially placed on broad-spectrum antibiotics with vancomycin and Zosyn, he was changed to ceftriaxone overnight due to Proteus bacteremia.  Source perhaps the wound although Proteus is usually associated with GI tract infections  Active Problems Right lower lobe pneumonia -Continue ceftriaxone, I have added azithromycin.  He recently went through influenza and will cover for atypicals as well  Right lower extremity wound -Orthopedic surgery consulted, Dr. Sharol Given evaluated patient, considering for right BKA.  Vascular surgery was consulted as well, in agreement that right-sided BKA is probably the best option  End-stage renal disease on HD -Nephrology consulted, appreciate  input  Type 2 diabetes mellitus -Continue sliding scale insulin  Hypertension -Continue home medications  Recent influenza A -More than 2 weeks ago, status post completed Tamiflu   Scheduled Meds: . amLODipine  10 mg Oral Daily  . brimonidine  1 drop Both Eyes BID  . Chlorhexidine Gluconate Cloth  6 each Topical Q0600  . feeding supplement (ENSURE ENLIVE)  237 mL Oral TID BM  . heparin  5,000 Units Subcutaneous Q8H  . insulin aspart  0-5 Units Subcutaneous QHS  . insulin aspart  0-9 Units Subcutaneous TID WC  . metoCLOPramide  5 mg Oral TID AC  . pantoprazole  40 mg Oral Daily  . sevelamer carbonate  800 mg Oral TID WC   Continuous Infusions: . cefTRIAXone (ROCEPHIN)  IV 2 g (05/13/18 0245)   PRN Meds:.acetaminophen, albuterol, ondansetron  DVT prophylaxis: Heparin Code Status: Full code Family Communication: No family present at bedside Disposition Plan: To be determined  Consultants:   Orthopedic surgery  Vascular surgery  Nephrology  Procedures:   None  Antimicrobials:  Vancomycin/Zosyn 2/21--2/22  Ceftriaxone/azithromycin 2/22--  Objective: Vitals:   05/12/18 2020 05/12/18 2219 05/13/18 0345 05/13/18 0729  BP: 115/80  (!) 136/97 127/62  Pulse: (!) 118 (!) 106 95 95  Resp: 18  20 19   Temp: 98.9 F (37.2 C)  98.5 F (36.9 C) 98 F (36.7 C)  TempSrc: Oral  Oral Oral  SpO2: (!) 83% 96% 91% 94%  Weight: 99.1 kg     Height:        Intake/Output Summary (Last 24 hours) at 05/13/2018 1100 Last data filed at 05/13/2018 0911 Gross per  24 hour  Intake 900 ml  Output -  Net 900 ml   Filed Weights   05/12/18 0935 05/12/18 2020  Weight: 99.5 kg 99.1 kg    Examination:  Constitutional: NAD Eyes: No scleral icterus noted ENMT: Mucous membranes are moist. Respiratory: clear to auscultation bilaterally, no wheezing, diminished at the bases.  No crackles heard Cardiovascular: Regular rate and rhythm, no murmurs / rubs / gallops. No LE edema.    Abdomen: no tenderness.  Musculoskeletal: no clubbing / cyanosis.  Skin: Dark gangrenous wound extending through half of the plantar area as well as fourth toe with surrounding cellulitis Neurologic: Grossly nonfocal   Data Reviewed: I have independently reviewed following labs and imaging studies   CBC: Recent Labs  Lab 05/12/18 0950  WBC 15.4*  NEUTROABS 12.1*  HGB 10.2*  HCT 32.2*  MCV 93.6  PLT 272   Basic Metabolic Panel: Recent Labs  Lab 05/12/18 0950  NA 133*  K 4.9  CL 90*  CO2 26  GLUCOSE 130*  BUN 45*  CREATININE 11.91*  CALCIUM 8.1*   GFR: Estimated Creatinine Clearance: 9 mL/min (A) (by C-G formula based on SCr of 11.91 mg/dL (H)). Liver Function Tests: Recent Labs  Lab 05/12/18 0950  AST 16  ALT 11  ALKPHOS 83  BILITOT 1.0  PROT 8.2*  ALBUMIN 2.7*   No results for input(s): LIPASE, AMYLASE in the last 168 hours. No results for input(s): AMMONIA in the last 168 hours. Coagulation Profile: No results for input(s): INR, PROTIME in the last 168 hours. Cardiac Enzymes: No results for input(s): CKTOTAL, CKMB, CKMBINDEX, TROPONINI in the last 168 hours. BNP (last 3 results) No results for input(s): PROBNP in the last 8760 hours. HbA1C: Recent Labs    05/12/18 1629  HGBA1C 8.3*   CBG: Recent Labs  Lab 05/12/18 1623 05/12/18 2025 05/13/18 0726  GLUCAP 132* 102* 118*   Lipid Profile: No results for input(s): CHOL, HDL, LDLCALC, TRIG, CHOLHDL, LDLDIRECT in the last 72 hours. Thyroid Function Tests: No results for input(s): TSH, T4TOTAL, FREET4, T3FREE, THYROIDAB in the last 72 hours. Anemia Panel: No results for input(s): VITAMINB12, FOLATE, FERRITIN, TIBC, IRON, RETICCTPCT in the last 72 hours. Urine analysis:    Component Value Date/Time   COLORURINE RED (A) 11/19/2016 0020   APPEARANCEUR TURBID (A) 11/19/2016 0020   LABSPEC  11/19/2016 0020    TEST NOT REPORTED DUE TO COLOR INTERFERENCE OF URINE PIGMENT   PHURINE  11/19/2016 0020     TEST NOT REPORTED DUE TO COLOR INTERFERENCE OF URINE PIGMENT   GLUCOSEU (A) 11/19/2016 0020    TEST NOT REPORTED DUE TO COLOR INTERFERENCE OF URINE PIGMENT   HGBUR (A) 11/19/2016 0020    TEST NOT REPORTED DUE TO COLOR INTERFERENCE OF URINE PIGMENT   BILIRUBINUR (A) 11/19/2016 0020    TEST NOT REPORTED DUE TO COLOR INTERFERENCE OF URINE PIGMENT   KETONESUR (A) 11/19/2016 0020    TEST NOT REPORTED DUE TO COLOR INTERFERENCE OF URINE PIGMENT   PROTEINUR (A) 11/19/2016 0020    TEST NOT REPORTED DUE TO COLOR INTERFERENCE OF URINE PIGMENT   NITRITE (A) 11/19/2016 0020    TEST NOT REPORTED DUE TO COLOR INTERFERENCE OF URINE PIGMENT   LEUKOCYTESUR (A) 11/19/2016 0020    TEST NOT REPORTED DUE TO COLOR INTERFERENCE OF URINE PIGMENT   Sepsis Labs: Invalid input(s): PROCALCITONIN, LACTICIDVEN  Recent Results (from the past 240 hour(s))  Blood culture (routine x 2)     Status: None (  Preliminary result)   Collection Time: 05/12/18  9:50 AM  Result Value Ref Range Status   Specimen Description BLOOD RIGHT ANTECUBITAL  Final   Special Requests   Final    BOTTLES DRAWN AEROBIC AND ANAEROBIC Blood Culture adequate volume   Culture NO GROWTH < 12 HOURS  Final   Report Status PENDING  Incomplete  Blood culture (routine x 2)     Status: None (Preliminary result)   Collection Time: 05/12/18 10:00 AM  Result Value Ref Range Status   Specimen Description BLOOD RIGHT FOREARM  Final   Special Requests   Final    BOTTLES DRAWN AEROBIC AND ANAEROBIC Blood Culture adequate volume   Culture  Setup Time   Final    ABUNDANT GRAM NEGATIVE RODS Organism ID to follow CRITICAL RESULT CALLED TO, READ BACK BY AND VERIFIED WITH: L SEAY @ 0222 ON 05/13/18 BY ROBINSON Z. WITH BCID RESULTS    Culture NO GROWTH < 12 HOURS  Final   Report Status PENDING  Incomplete  Blood Culture ID Panel (Reflexed)     Status: Abnormal   Collection Time: 05/12/18 10:00 AM  Result Value Ref Range Status   Enterococcus species NOT  DETECTED NOT DETECTED Final   Listeria monocytogenes NOT DETECTED NOT DETECTED Final   Staphylococcus species NOT DETECTED NOT DETECTED Final   Staphylococcus aureus (BCID) NOT DETECTED NOT DETECTED Final   Streptococcus species NOT DETECTED NOT DETECTED Final   Streptococcus agalactiae NOT DETECTED NOT DETECTED Final   Streptococcus pneumoniae NOT DETECTED NOT DETECTED Final   Streptococcus pyogenes NOT DETECTED NOT DETECTED Final   Acinetobacter baumannii NOT DETECTED NOT DETECTED Final   Enterobacteriaceae species DETECTED (A) NOT DETECTED Final    Comment: Enterobacteriaceae represent a large family of gram-negative bacteria, not a single organism. CRITICAL RESULT CALLED TO, READ BACK BY AND VERIFIED WITH: L SEAY @ 0222 ON 05/13/18 BY ROBINSON Z.     Enterobacter cloacae complex NOT DETECTED NOT DETECTED Final   Escherichia coli NOT DETECTED NOT DETECTED Final   Klebsiella oxytoca NOT DETECTED NOT DETECTED Final   Klebsiella pneumoniae NOT DETECTED NOT DETECTED Final   Proteus species DETECTED (A) NOT DETECTED Final    Comment: CRITICAL RESULT CALLED TO, READ BACK BY AND VERIFIED WITH: L SEAY @ 0222 ON 05/13/18 BY ROBINSON Z.    Serratia marcescens NOT DETECTED NOT DETECTED Final   Carbapenem resistance NOT DETECTED NOT DETECTED Final   Haemophilus influenzae NOT DETECTED NOT DETECTED Final   Neisseria meningitidis NOT DETECTED NOT DETECTED Final   Pseudomonas aeruginosa NOT DETECTED NOT DETECTED Final   Candida albicans NOT DETECTED NOT DETECTED Final   Candida glabrata NOT DETECTED NOT DETECTED Final   Candida krusei NOT DETECTED NOT DETECTED Final   Candida parapsilosis NOT DETECTED NOT DETECTED Final   Candida tropicalis NOT DETECTED NOT DETECTED Final  MRSA PCR Screening     Status: Abnormal   Collection Time: 05/12/18  5:01 PM  Result Value Ref Range Status   MRSA by PCR POSITIVE (A) NEGATIVE Final    Comment:        The GeneXpert MRSA Assay (FDA approved for NASAL  specimens only), is one component of a comprehensive MRSA colonization surveillance program. It is not intended to diagnose MRSA infection nor to guide or monitor treatment for MRSA infections. RESULT CALLED TO, READ BACK BY AND VERIFIED WITH: Leandro Reasoner RN 05/12/18 2116 JDW Performed at [facility], [place] [address], [place], [place] [address]  Radiology Studies: Dg Chest 2 View  Result Date: 05/12/2018 CLINICAL DATA:  Productive cough.  Fever and nausea. EXAM: CHEST - 2 VIEW COMPARISON:  Chest x-ray and chest CTs dated 05/02/2018, 11/09/2017 and 11/06/2017 FINDINGS: There is persistent haziness in the right lung base posteriorly consistent with the hazy infiltrate seen on the prior study. There is persistent fullness in the mediastinum, demonstrated to be adenopathy on multiple prior chest CTs. Heart size and pulmonary vascularity are normal. Stent in the left subclavian vein. Left lung is clear. IMPRESSION: 1. Persistent faint haziness at the right lung base posteriorly. Persistent mediastinal adenopathy. 2. Given the patient's clinical symptoms, the findings at the right lung base could represent recurrent pneumonia in the right lower lobe. 3. The chronic mediastinal adenopathy raises the possibility of sarcoidosis. Electronically Signed   By: Lorriane Shire M.D.   On: 05/12/2018 10:58   Dg Foot Complete Right  Result Date: 05/12/2018 CLINICAL DATA:  Fever in a diabetic patient with a skin wound on the right foot. EXAM: RIGHT FOOT COMPLETE - 3+ VIEW COMPARISON:  Plain films right foot 04/13/2010 FINDINGS: No bony destructive change or periosteal reaction is identified. There is no fracture or dislocation. No evidence of arthropathy. No soft tissue gas or radiopaque foreign body. Extensive vascular calcifications are noted. IMPRESSION: No acute abnormality. Atherosclerosis. Electronically Signed   By: Inge Rise M.D.   On: 05/12/2018 10:51    Marzetta Board, MD, PhD [facility]  Contact via  [email or web address]  [place] P: [phone]  F: [phone]

## 2018-05-13 NOTE — Progress Notes (Addendum)
[facility] Progress Note   Subjective: Says he feels awful. Didn't get HD yesterday D/T scheduling issues. HD today off schedule.   Objective Vitals:   05/12/18 2020 05/12/18 2219 05/13/18 0345 05/13/18 0729  BP: 115/80  (!) 136/97 127/62  Pulse: (!) 118 (!) 106 95 95  Resp: 18  20 19   Temp: 98.9 F (37.2 C)  98.5 F (36.9 C) 98 F (36.7 C)  TempSrc: Oral  Oral Oral  SpO2: (!) 83% 96% 91% 94%  Weight: 99.1 kg     Height:       Physical Exam General: WN,WD male in NAD Heart: S1,S2, RRR Lungs: Decreased in bases bilaterally otherwise CTAB.  Abdomen: S, NT Extremities: No LE edema. Full thickness gangrenous ulcer dorsal aspect of R foot and 4th toe. Malodorous, no drainage.  Dialysis Access: R AVG + Bruit.    Additional Objective Labs: Basic Metabolic Panel: Recent Labs  Lab 05/12/18 0950  NA 133*  K 4.9  CL 90*  CO2 26  GLUCOSE 130*  BUN 45*  CREATININE 11.91*  CALCIUM 8.1*   Liver Function Tests: Recent Labs  Lab 05/12/18 0950  AST 16  ALT 11  ALKPHOS 83  BILITOT 1.0  PROT 8.2*  ALBUMIN 2.7*   No results for input(s): LIPASE, AMYLASE in the last 168 hours. CBC: Recent Labs  Lab 05/12/18 0950  WBC 15.4*  NEUTROABS 12.1*  HGB 10.2*  HCT 32.2*  MCV 93.6  PLT 280   Blood Culture    Component Value Date/Time   SDES BLOOD RIGHT FOREARM 05/12/2018 1000   SPECREQUEST  05/12/2018 1000    BOTTLES DRAWN AEROBIC AND ANAEROBIC Blood Culture adequate volume   CULT NO GROWTH < 12 HOURS 05/12/2018 1000   REPTSTATUS PENDING 05/12/2018 1000    Cardiac Enzymes: No results for input(s): CKTOTAL, CKMB, CKMBINDEX, TROPONINI in the last 168 hours. CBG: Recent Labs  Lab 05/12/18 1623 05/12/18 2025 05/13/18 0726  GLUCAP 132* 102* 118*   Iron Studies: No results for input(s): IRON, TIBC, TRANSFERRIN, FERRITIN in the last 72 hours. @lablastinr3 @ Studies/Results: Dg Chest 2 View  Result Date: 05/12/2018 CLINICAL DATA:  Productive cough.   Fever and nausea. EXAM: CHEST - 2 VIEW COMPARISON:  Chest x-ray and chest CTs dated 05/02/2018, 11/09/2017 and 11/06/2017 FINDINGS: There is persistent haziness in the right lung base posteriorly consistent with the hazy infiltrate seen on the prior study. There is persistent fullness in the mediastinum, demonstrated to be adenopathy on multiple prior chest CTs. Heart size and pulmonary vascularity are normal. Stent in the left subclavian vein. Left lung is clear. IMPRESSION: 1. Persistent faint haziness at the right lung base posteriorly. Persistent mediastinal adenopathy. 2. Given the patient's clinical symptoms, the findings at the right lung base could represent recurrent pneumonia in the right lower lobe. 3. The chronic mediastinal adenopathy raises the possibility of sarcoidosis. Electronically Signed   By: Lorriane Shire M.D.   On: 05/12/2018 10:58   Dg Foot Complete Right  Result Date: 05/12/2018 CLINICAL DATA:  Fever in a diabetic patient with a skin wound on the right foot. EXAM: RIGHT FOOT COMPLETE - 3+ VIEW COMPARISON:  Plain films right foot 04/13/2010 FINDINGS: No bony destructive change or periosteal reaction is identified. There is no fracture or dislocation. No evidence of arthropathy. No soft tissue gas or radiopaque foreign body. Extensive vascular calcifications are noted. IMPRESSION: No acute abnormality. Atherosclerosis. Electronically Signed   By: Inge Rise M.D.   On: 05/12/2018 10:51  Medications: . sodium chloride    . sodium chloride    . cefTRIAXone (ROCEPHIN)  IV 2 g (05/13/18 0245)   . amLODipine  10 mg Oral Daily  . azithromycin  500 mg Oral Daily  . brimonidine  1 drop Both Eyes BID  . Chlorhexidine Gluconate Cloth  6 each Topical Q0600  . feeding supplement (ENSURE ENLIVE)  237 mL Oral TID BM  . heparin  5,000 Units Subcutaneous Q8H  . insulin aspart  0-5 Units Subcutaneous QHS  . insulin aspart  0-9 Units Subcutaneous TID WC  . metoCLOPramide  5 mg Oral  TID AC  . pantoprazole  40 mg Oral Daily  . sevelamer carbonate  800 mg Oral TID WC   HD orders: [facility] MWF 4 hr 15 min 180 NRe 450/autoflow 2.0 97.5 kg 2.0 K/2.25 Ca UFP 2  R AVG -Heparin 9000 units IV TIW -Parsabiv 10 mcg IV TIW (Not on hospital formulary) -Hectorol 7 mcg IV TIW   Assessment/Plan: 1. Sepsis / Proteus bacteremia: BC  Positive for GNR. Unclear source of infection but probably wound R foot. On Ceftriaxone, Azithromycin per primary. WBC 18.9 2. RLL PNA: Per primary.  3. Gangrenous wound R foot-seen by Dr. Sharol Given, Dr. Doren Custard who do not believe foot is salvageable. 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 Per primary/ortho.  4. ESRD -MWF-missed HD yesterday D/T scheduling. HD today off schedule.  5. Anemia -HGB 10.0 No ESA/Fe as OP. Follow trend.  6. Secondary hyperparathyroidism - Ca 8.1 C Ca 9.1 Continue VDRA, binders. Parsabiv not available at hospital. Non-compliant with binders as OP, last phos 10.2 05/10/18.  7. HTN/volume -Does not appear overtly volume overloaded by exam. Left 0.5 under EDW last HD 02/19. Probably losing wt. BP well controlled. Lower volume as tolerated. 8. Nutrition - Renal/Carb mod diet. Albumin 2.7. Add prostat, renal vits.  9. DM-per primary  Jimmye Norman. Brown NP-C 05/13/2018, 11:29 AM  [facility] [phone]  Pt seen, examined and agree w A/P as above.  [facility] 05/13/2018, 1:09 PM

## 2018-05-13 NOTE — Progress Notes (Signed)
PHARMACY - PHYSICIAN COMMUNICATION CRITICAL VALUE ALERT - BLOOD CULTURE IDENTIFICATION (BCID)  Howard Bell is an 53 y.o. male who presented to [facility] on 05/12/2018 with a chief complaint of right foot wound  Assessment:  1/2 BC positive for proteus  Name of physician (or Provider) Contacted: X Blount  Current antibiotics: zosyn  Changes to prescribed antibiotics recommended:  Change zosyn to rocephin 2gm IV q24 hours  Results for orders placed or performed during the hospital encounter of 05/12/18  Blood Culture ID Panel (Reflexed) (Collected: 05/12/2018 10:00 AM)  Result Value Ref Range   Enterococcus species NOT DETECTED NOT DETECTED   Listeria monocytogenes NOT DETECTED NOT DETECTED   Staphylococcus species NOT DETECTED NOT DETECTED   Staphylococcus aureus (BCID) NOT DETECTED NOT DETECTED   Streptococcus species NOT DETECTED NOT DETECTED   Streptococcus agalactiae NOT DETECTED NOT DETECTED   Streptococcus pneumoniae NOT DETECTED NOT DETECTED   Streptococcus pyogenes NOT DETECTED NOT DETECTED   Acinetobacter baumannii NOT DETECTED NOT DETECTED   Enterobacteriaceae species DETECTED (A) NOT DETECTED   Enterobacter cloacae complex NOT DETECTED NOT DETECTED   Escherichia coli NOT DETECTED NOT DETECTED   Klebsiella oxytoca NOT DETECTED NOT DETECTED   Klebsiella pneumoniae NOT DETECTED NOT DETECTED   Proteus species DETECTED (A) NOT DETECTED   Serratia marcescens NOT DETECTED NOT DETECTED   Carbapenem resistance NOT DETECTED NOT DETECTED   Haemophilus influenzae NOT DETECTED NOT DETECTED   Neisseria meningitidis NOT DETECTED NOT DETECTED   Pseudomonas aeruginosa NOT DETECTED NOT DETECTED   Candida albicans NOT DETECTED NOT DETECTED   Candida glabrata NOT DETECTED NOT DETECTED   Candida krusei NOT DETECTED NOT DETECTED   Candida parapsilosis NOT DETECTED NOT DETECTED   Candida tropicalis NOT DETECTED NOT DETECTED    Lauralee Evener, Arlenne Kimbley Poteet 05/13/2018  2:28 AM

## 2018-05-14 ENCOUNTER — Inpatient Hospital Stay (HOSPITAL_COMMUNITY): Payer: Medicare Other | Admitting: Certified Registered Nurse Anesthetist

## 2018-05-14 ENCOUNTER — Encounter (HOSPITAL_COMMUNITY)
Admission: EM | Disposition: A | Discharge status: Home / Self Care | Payer: Self-pay | Source: Home / Self Care | Attending: Internal Medicine

## 2018-05-14 DIAGNOSIS — L03115 Cellulitis of right lower limb: Secondary | ICD-10-CM

## 2018-05-14 HISTORY — PX: AMPUTATION: SHX166

## 2018-05-14 LAB — GLUCOSE, CAPILLARY
Glucose-Capillary: 176 mg/dL — ABNORMAL HIGH (ref 70–99)
Glucose-Capillary: 169 mg/dL — ABNORMAL HIGH (ref 70–99)
Glucose-Capillary: 155 mg/dL — ABNORMAL HIGH (ref 70–99)
Glucose-Capillary: 117 mg/dL — ABNORMAL HIGH (ref 70–99)
Glucose-Capillary: 103 mg/dL — ABNORMAL HIGH (ref 70–99)
Glucose-Capillary: 157 mg/dL — ABNORMAL HIGH (ref 70–99)

## 2018-05-14 LAB — SURGICAL PCR SCREEN
MRSA, PCR: POSITIVE — AB
Staphylococcus aureus: POSITIVE — AB

## 2018-05-14 SURGERY — AMPUTATION BELOW KNEE
Anesthesia: Regional | Site: Leg Lower | Laterality: Right

## 2018-05-14 MED ORDER — ALBUMIN HUMAN 5 % IV SOLN
INTRAVENOUS | Status: AC
  Filled 2018-05-14: qty 250

## 2018-05-14 MED ORDER — PROPOFOL 1000 MG/100ML IV EMUL
INTRAVENOUS | Status: AC
  Filled 2018-05-14: qty 100

## 2018-05-14 MED ORDER — ACETAMINOPHEN 325 MG PO TABS
325.0000 mg | ORAL_TABLET | Freq: Four times a day (QID) | ORAL | Status: DC | PRN
  Administered 2018-05-15: 650 mg via ORAL
  Filled 2018-05-14: qty 2

## 2018-05-14 MED ORDER — SODIUM CHLORIDE 0.9 % IV SOLN
INTRAVENOUS | Status: DC | PRN
  Administered 2018-05-14: 16:00:00 via INTRAVENOUS

## 2018-05-14 MED ORDER — ONDANSETRON HCL 4 MG/2ML IJ SOLN: 4.0000 mg | Freq: Four times a day (QID) | INTRAMUSCULAR | Status: DC | PRN

## 2018-05-14 MED ORDER — METOCLOPRAMIDE HCL 5 MG PO TABS: 5.0000 mg | ORAL_TABLET | Freq: Three times a day (TID) | ORAL | Status: DC | PRN

## 2018-05-14 MED ORDER — METHOCARBAMOL 1000 MG/10ML IJ SOLN
500.0000 mg | Freq: Four times a day (QID) | INTRAVENOUS | Status: DC | PRN
  Filled 2018-05-14: qty 5

## 2018-05-14 MED ORDER — GUAIFENESIN-DM 100-10 MG/5ML PO SYRP: 5.0000 mL | ORAL_SOLUTION | ORAL | Status: DC | PRN

## 2018-05-14 MED ORDER — MIDAZOLAM HCL 2 MG/2ML IJ SOLN
INTRAMUSCULAR | Status: DC | PRN
  Administered 2018-05-14 (×2): 1 mg via INTRAVENOUS

## 2018-05-14 MED ORDER — POVIDONE-IODINE 10 % EX SWAB: 2.0000 "application " | Freq: Once | CUTANEOUS | Status: DC

## 2018-05-14 MED ORDER — METOPROLOL SUCCINATE ER 100 MG PO TB24
100.0000 mg | ORAL_TABLET | Freq: Every day | ORAL | Status: DC
  Administered 2018-05-14: 100 mg via ORAL
  Filled 2018-05-14: qty 1

## 2018-05-14 MED ORDER — METOCLOPRAMIDE HCL 5 MG/ML IJ SOLN: 5.0000 mg | Freq: Three times a day (TID) | INTRAMUSCULAR | Status: DC | PRN

## 2018-05-14 MED ORDER — ONDANSETRON HCL 4 MG/2ML IJ SOLN
INTRAMUSCULAR | Status: DC | PRN
  Administered 2018-05-14: 4 mg via INTRAVENOUS

## 2018-05-14 MED ORDER — FENTANYL CITRATE (PF) 250 MCG/5ML IJ SOLN
INTRAMUSCULAR | Status: DC | PRN
  Administered 2018-05-14: 50 ug via INTRAVENOUS
  Administered 2018-05-14: 25 ug via INTRAVENOUS

## 2018-05-14 MED ORDER — BISACODYL 10 MG RE SUPP: 10.0000 mg | Freq: Every day | RECTAL | Status: DC | PRN

## 2018-05-14 MED ORDER — CHLORHEXIDINE GLUCONATE CLOTH 2 % EX PADS
6.0000 | MEDICATED_PAD | Freq: Every day | CUTANEOUS | Status: DC
  Administered 2018-05-15 – 2018-05-18 (×3): 6 via TOPICAL

## 2018-05-14 MED ORDER — FENTANYL CITRATE (PF) 250 MCG/5ML IJ SOLN
INTRAMUSCULAR | Status: AC
  Filled 2018-05-14: qty 5

## 2018-05-14 MED ORDER — MUPIROCIN 2 % EX OINT
1.0000 "application " | TOPICAL_OINTMENT | Freq: Two times a day (BID) | CUTANEOUS | Status: DC
  Administered 2018-05-14 – 2018-05-18 (×8): 1 via NASAL
  Filled 2018-05-14 (×2): qty 22

## 2018-05-14 MED ORDER — ONDANSETRON HCL 4 MG PO TABS: 4.0000 mg | ORAL_TABLET | Freq: Four times a day (QID) | ORAL | Status: DC | PRN

## 2018-05-14 MED ORDER — MIDAZOLAM HCL 2 MG/2ML IJ SOLN
INTRAMUSCULAR | Status: AC
  Filled 2018-05-14: qty 2

## 2018-05-14 MED ORDER — OXYCODONE HCL 5 MG PO TABS
5.0000 mg | ORAL_TABLET | ORAL | Status: DC | PRN
  Administered 2018-05-14 – 2018-05-17 (×5): 10 mg via ORAL
  Filled 2018-05-14 (×5): qty 2

## 2018-05-14 MED ORDER — DOCUSATE SODIUM 100 MG PO CAPS
100.0000 mg | ORAL_CAPSULE | Freq: Two times a day (BID) | ORAL | Status: DC
  Administered 2018-05-14 – 2018-05-18 (×8): 100 mg via ORAL
  Filled 2018-05-14 (×8): qty 1

## 2018-05-14 MED ORDER — SODIUM CHLORIDE 0.9 % IV SOLN
INTRAVENOUS | Status: DC
  Administered 2018-05-17: 13:00:00 via INTRAVENOUS

## 2018-05-14 MED ORDER — ROPIVACAINE HCL 5 MG/ML IJ SOLN
INTRAMUSCULAR | Status: DC | PRN
  Administered 2018-05-14: 47 mL via PERINEURAL

## 2018-05-14 MED ORDER — METHOCARBAMOL 500 MG PO TABS
500.0000 mg | ORAL_TABLET | Freq: Four times a day (QID) | ORAL | Status: DC | PRN
  Administered 2018-05-14 – 2018-05-17 (×2): 500 mg via ORAL
  Filled 2018-05-14 (×2): qty 1

## 2018-05-14 MED ORDER — PROPOFOL 500 MG/50ML IV EMUL
INTRAVENOUS | Status: DC | PRN
  Administered 2018-05-14: 50 ug/kg/min via INTRAVENOUS

## 2018-05-14 MED ORDER — OXYCODONE HCL 5 MG PO TABS
10.0000 mg | ORAL_TABLET | ORAL | Status: DC | PRN
  Administered 2018-05-18: 10 mg via ORAL
  Filled 2018-05-14: qty 2
  Filled 2018-05-14: qty 3

## 2018-05-14 MED ORDER — ALBUMIN HUMAN 5 % IV SOLN
12.5000 g | Freq: Once | INTRAVENOUS | Status: AC
  Administered 2018-05-14: 12.5 g via INTRAVENOUS

## 2018-05-14 MED ORDER — 0.9 % SODIUM CHLORIDE (POUR BTL) OPTIME
TOPICAL | Status: DC | PRN
  Administered 2018-05-14: 1000 mL

## 2018-05-14 MED ORDER — VANCOMYCIN HCL IN DEXTROSE 750-5 MG/150ML-% IV SOLN
750.0000 mg | Freq: Once | INTRAVENOUS | Status: AC
  Administered 2018-05-14: 750 mg via INTRAVENOUS
  Filled 2018-05-14: qty 150

## 2018-05-14 MED ORDER — ONDANSETRON HCL 4 MG/2ML IJ SOLN: 4.0000 mg | Freq: Once | INTRAMUSCULAR | Status: DC | PRN

## 2018-05-14 MED ORDER — MAGNESIUM CITRATE PO SOLN: 1.0000 | Freq: Once | ORAL | Status: DC | PRN

## 2018-05-14 MED ORDER — FENTANYL CITRATE (PF) 100 MCG/2ML IJ SOLN: 25.0000 ug | INTRAMUSCULAR | Status: DC | PRN

## 2018-05-14 MED ORDER — POLYETHYLENE GLYCOL 3350 17 G PO PACK: 17.0000 g | PACK | Freq: Every day | ORAL | Status: DC | PRN

## 2018-05-14 MED ORDER — HYDROMORPHONE HCL 1 MG/ML IJ SOLN
0.5000 mg | INTRAMUSCULAR | Status: DC | PRN
  Administered 2018-05-15 – 2018-05-16 (×3): 1 mg via INTRAVENOUS
  Filled 2018-05-14 (×2): qty 1

## 2018-05-14 MED ORDER — CHLORHEXIDINE GLUCONATE 4 % EX LIQD
60.0000 mL | Freq: Once | CUTANEOUS | Status: AC
  Administered 2018-05-14: 4 via TOPICAL
  Filled 2018-05-14: qty 60

## 2018-05-14 MED ORDER — SODIUM CHLORIDE 0.9 % IV SOLN
500.0000 mg | Freq: Every day | INTRAVENOUS | Status: DC
  Administered 2018-05-14: 500 mg via INTRAVENOUS
  Filled 2018-05-14: qty 0.5

## 2018-05-14 SURGICAL SUPPLY — 42 items
BENZOIN TINCTURE PRP APPL 2/3 (GAUZE/BANDAGES/DRESSINGS) ×4 IMPLANT
BLADE SAW RECIP 87.9 MT (BLADE) ×2 IMPLANT
BLADE SURG 21 STRL SS (BLADE) ×2 IMPLANT
BNDG COHESIVE 6X5 TAN STRL LF (GAUZE/BANDAGES/DRESSINGS) ×3 IMPLANT
BNDG GAUZE ELAST 4 BULKY (GAUZE/BANDAGES/DRESSINGS) ×2 IMPLANT
CANISTER WOUND CARE 500ML ATS (WOUND CARE) ×1 IMPLANT
COVER SURGICAL LIGHT HANDLE (MISCELLANEOUS) ×2 IMPLANT
COVER WAND RF STERILE (DRAPES) ×2 IMPLANT
CUFF TOURNIQUET SINGLE 34IN LL (TOURNIQUET CUFF) ×3 IMPLANT
CUFF TOURNIQUET SINGLE 44IN (TOURNIQUET CUFF) IMPLANT
DRAPE INCISE IOBAN 66X45 STRL (DRAPES) ×1 IMPLANT
DRAPE U-SHAPE 47X51 STRL (DRAPES) ×2 IMPLANT
DRESSING PEEL AND PLAC PRVNA20 (GAUZE/BANDAGES/DRESSINGS) IMPLANT
DRESSING PREVENA PLUS CUSTOM (GAUZE/BANDAGES/DRESSINGS) ×1 IMPLANT
DRSG PEEL AND PLACE PREVENA 20 (GAUZE/BANDAGES/DRESSINGS) ×2
DRSG PREVENA PLUS CUSTOM (GAUZE/BANDAGES/DRESSINGS) ×2
DRSG VAC ATS LRG SENSATRAC (GAUZE/BANDAGES/DRESSINGS) ×1 IMPLANT
DURAPREP 26ML APPLICATOR (WOUND CARE) ×2 IMPLANT
ELECT REM PT RETURN 9FT ADLT (ELECTROSURGICAL) ×2
ELECTRODE REM PT RTRN 9FT ADLT (ELECTROSURGICAL) ×1 IMPLANT
GLOVE BIOGEL PI IND STRL 9 (GLOVE) ×1 IMPLANT
GLOVE BIOGEL PI INDICATOR 9 (GLOVE) ×1
GLOVE SURG ORTHO 9.0 STRL STRW (GLOVE) ×2 IMPLANT
GOWN STRL REUS W/ TWL XL LVL3 (GOWN DISPOSABLE) ×2 IMPLANT
GOWN STRL REUS W/TWL XL LVL3 (GOWN DISPOSABLE) ×2
KIT BASIN OR (CUSTOM PROCEDURE TRAY) ×2 IMPLANT
KIT TURNOVER KIT B (KITS) ×2 IMPLANT
MANIFOLD NEPTUNE II (INSTRUMENTS) ×2 IMPLANT
NS IRRIG 1000ML POUR BTL (IV SOLUTION) ×2 IMPLANT
PACK ORTHO EXTREMITY (CUSTOM PROCEDURE TRAY) ×2 IMPLANT
PAD ARMBOARD 7.5X6 YLW CONV (MISCELLANEOUS) ×2 IMPLANT
PREVENA RESTOR ARTHOFORM 46X30 (CANNISTER) ×1 IMPLANT
SPONGE LAP 18X18 RF (DISPOSABLE) ×1 IMPLANT
STAPLER VISISTAT 35W (STAPLE) ×1 IMPLANT
STOCKINETTE IMPERVIOUS LG (DRAPES) ×2 IMPLANT
SUT ETHILON 2 0 PSLX (SUTURE) ×1 IMPLANT
SUT SILK 2 0 (SUTURE)
SUT SILK 2-0 18XBRD TIE 12 (SUTURE) ×1 IMPLANT
SUT VIC AB 1 CTX 27 (SUTURE) ×3 IMPLANT
TOWEL OR 17X26 10 PK STRL BLUE (TOWEL DISPOSABLE) ×2 IMPLANT
TUBE CONNECTING 12X1/4 (SUCTIONS) ×2 IMPLANT
YANKAUER SUCT BULB TIP NO VENT (SUCTIONS) ×2 IMPLANT

## 2018-05-14 NOTE — Progress Notes (Signed)
Patient ID: Howard Bell, male   DOB: 28-Aug-1965, 53 y.o.   MRN: [id number] Patient is seen in follow-up for gangrene involving the right foot.  Patient has had fever and chills with systemic symptoms from the gangrenous changes to the right foot.  Vascular surgery has evaluated the patient and felt that he has adequate circulation for a below the knee amputation.  Consultation with Dr. [name] he also feels the patient requires a transtibial amputation.  I have discussed recommendations with the patient's wife.  Patient states he wants to discuss this with his father and his wife prior to making a decision.  Will make patient n.p.o. at this time plan for surgery this afternoon.  Patient will need discharge to skilled nursing postoperatively.

## 2018-05-14 NOTE — Anesthesia Postprocedure Evaluation (Signed)
Anesthesia Post Note  Patient: Howard Bell  Procedure(s) Performed: AMPUTATION BELOW KNEE (Right Leg Lower)     Patient location during evaluation: PACU Anesthesia Type: Regional Level of consciousness: awake and alert Pain management: pain level controlled Vital Signs Assessment: post-procedure vital signs reviewed and stable Respiratory status: spontaneous breathing, nonlabored ventilation, respiratory function stable and patient connected to nasal cannula oxygen Cardiovascular status: stable and blood pressure returned to baseline Postop Assessment: no apparent nausea or vomiting Anesthetic complications: no    Last Vitals:  Vitals:   05/14/18 1806 05/14/18 1821  BP: 113/65 (!) 101/57  Pulse:  80  Resp: 11 15  Temp:  36.5 C  SpO2: 99% 96%    Last Pain:  Vitals:   05/14/18 1426  TempSrc: Oral  PainSc:                  Ryan P Ellender

## 2018-05-14 NOTE — Anesthesia Procedure Notes (Signed)
Anesthesia Regional Block: Popliteal block   Pre-Anesthetic Checklist: ,, timeout performed, Correct Patient, Correct Site, Correct Laterality, Correct Procedure,, site marked, risks and benefits discussed, Surgical consent,  Pre-op evaluation,  At surgeon's request and post-op pain management  Laterality: Right  Prep: chloraprep       Needles:  Injection technique: Single-shot  Needle Type: Echogenic Stimulator Needle     Needle Length: 10cm  Needle Gauge: 21     Additional Needles:   Procedures:, nerve stimulator,,, ultrasound used (permanent image in chart),,,,   Nerve Stimulator or Paresthesia:  Response: Right foot dorsiflexion , 0.8 mA,   Additional Responses:   Narrative:  Start time: 05/14/2018 3:35 PM End time: 05/14/2018 3:40 PM Injection made incrementally with aspirations every 5 mL.  Performed by: Personally  Anesthesiologist: Murvin Natal, MD  Additional Notes: Functioning IV was confirmed and monitors were applied.  A 1100mm 21ga Pajunk echogenic stimulator needle was used. Sterile prep, hand hygiene and sterile gloves were used.  Negative aspiration and negative test dose prior to incremental administration of local anesthetic. The patient tolerated the procedure well.

## 2018-05-14 NOTE — Op Note (Signed)
   Date of Surgery: 05/14/2018  INDICATIONS: Mr. Ambers is a 53 y.o.-year-old male who has gangrene involving the dorsum of the right foot.  He has had a vascular work-up and felt to have adequate circulation to heal a transtibial amputation.  Has been getting progressively septic and presents at this time for urgent transtibial amputation.Marland Kitchen  PREOPERATIVE DIAGNOSIS: Gangrene right foot  POSTOPERATIVE DIAGNOSIS: Same.  PROCEDURE: Transtibial amputation Application of Prevena wound VAC  SURGEON: Sharol Given, M.D.  ANESTHESIA:  general  IV FLUIDS AND URINE: See anesthesia.  ESTIMATED BLOOD LOSS: 300 mL.  COMPLICATIONS: None.  DESCRIPTION OF PROCEDURE: The patient was brought to the operating room and underwent a general anesthetic. After adequate levels of anesthesia were obtained patient's lower extremity was prepped using DuraPrep draped into a sterile field. A timeout was called. The foot was draped out of the sterile field with impervious stockinette. A transverse incision was made 11 cm distal to the tibial tubercle. This curved proximally and a large posterior flap was created. The tibia was transected 1 cm proximal to the skin incision. The fibula was transected just proximal to the tibial incision. The tibia was beveled anteriorly. A large posterior flap was created. The sciatic nerve was pulled cut and allowed to retract. The vascular bundles were suture ligated with 2-0 silk. The deep and superficial fascial layers were closed using #1 Vicryl. The skin was closed using staples and 2-0 nylon. The wound was covered with a Prevena wound VAC. There was a good suction fit. A prosthetic shrinker was applied. Patient was extubated taken to the PACU in stable condition.   DISCHARGE PLANNING:  Antibiotic duration: 24 hours postoperatively  Weightbearing: Nonweightbearing on the right  Pain medication: Opioid pathway ordered  Dressing care/ Wound VAC: Continue wound VAC for 1  week.  Discharge to: Anticipate patient will need discharge to skilled nursing.  He has to get up a flight of stairs to get to his apartment  [employer] was consulted for limb protector and stump shrinker  Follow-up: In the office 1 week post operative.  Meridee Score, MD [place] 4:50 PM

## 2018-05-14 NOTE — Anesthesia Preprocedure Evaluation (Addendum)
Anesthesia Evaluation  Patient identified by MRN, date of birth, ID band Patient awake    Reviewed: Allergy & Precautions, NPO status , Patient's Chart, lab work & pertinent test results  Airway Mallampati: III  TM Distance: >3 FB Neck ROM: Full    Dental no notable dental hx.    Pulmonary sleep apnea and Continuous Positive Airway Pressure Ventilation ,    Pulmonary exam normal breath sounds clear to auscultation       Cardiovascular hypertension, Pt. on medications and Pt. on home beta blockers +CHF  Normal cardiovascular exam Rhythm:Regular Rate:Normal  ECG: rate 100. Sinus tachycardia Left anterior fascicular block   Neuro/Psych negative neurological ROS  negative psych ROS   GI/Hepatic negative GI ROS, Neg liver ROS,   Endo/Other  diabetes, Insulin Dependent  Renal/GU ESRF and DialysisRenal diseaseOn HD M, W, F     Musculoskeletal negative musculoskeletal ROS (+)   Abdominal   Peds  Hematology  (+) anemia ,   Anesthesia Other Findings GANGRENE  Reproductive/Obstetrics                            Anesthesia Physical Anesthesia Plan  ASA: IV  Anesthesia Plan: Regional and MAC   Post-op Pain Management:  Regional for Post-op pain   Induction: Intravenous  PONV Risk Score and Plan: 1 and Ondansetron, Treatment may vary due to age or medical condition and Propofol infusion  Airway Management Planned: Simple Face Mask  Additional Equipment:   Intra-op Plan:   Post-operative Plan:   Informed Consent: I have reviewed the patients History and Physical, chart, labs and discussed the procedure including the risks, benefits and alternatives for the proposed anesthesia with the patient or authorized representative who has indicated his/her understanding and acceptance.     Dental advisory given  Plan Discussed with: CRNA and Surgeon  Anesthesia Plan Comments:         Anesthesia Quick Evaluation

## 2018-05-14 NOTE — Progress Notes (Signed)
PROGRESS NOTE  Howard Bell PFX:[id number] DOB: 1965/09/17 DOA: 05/12/2018 PCP: Horald Pollen, MD   LOS: 2 days   Brief Narrative / Interim history: 53 year old male with history of type 2 diabetes mellitus on insulin, ESRD on MWF HD, hypertension, gastroparesis, chronic right foot wound, recently diagnosed with flu a status post treatment couple weeks ago presents to the hospital with febrile illness.  He was complaining of ongoing cough with sputum production as well as worsening wound which turned black on the right foot.  He was septic in the ED with fever of one 1.7, white count of 15, chest x-ray showed probable right lower lobe pneumonia.  He was admitted to the hospital.  Subjective: Continues to complain of a persistent cough.  Febrile today.  Denies any foot pain.  Complains of chills.  No abdominal pain, nausea or vomiting.  No chest pain, no shortness of breath  Assessment & Plan: Principal Problem:   Recurrent pneumonia Active Problems:   Anemia due to end stage renal disease (HCC)   Type 2 diabetes mellitus with foot ulcer (HCC)   Essential hypertension   ESRD on dialysis [facility])   Influenza A   Pneumonia   Gangrene of right foot ([place])   Principal Problem Sepsis due to Proteus bacteremia -Patient was initially placed on broad-spectrum antibiotics with vancomycin and Zosyn, he was changed to ceftriaxone on 2/22 due to Proteus bacteremia.  Source perhaps the wound although Proteus is usually associated with GI tract infections -Patient remains febrile, tachycardic and white count is increasing today.  Given severity of the wound and he is MRSA positive would like to cover for MRSA and add back vancomycin.  Will broaden to meropenem for his gram-negative's until sensitivities come back  Active Problems Right lower lobe pneumonia -Continue antibiotics as above, this has been going on for the past 5 weeks, he also underwent a CT scan of the chest on 2/11 with  findings consistent with an infiltrate but felt more to be related to scarring.  Right lower extremity wound -Orthopedic and vascular surgery consulted, with plans now in place for right BKA.  Patient initially resistant, will continue discussions with his family  End-stage renal disease on HD -Nephrology consulted, appreciate input  Type 2 diabetes mellitus -Continue sliding scale insulin  Hypertension -Continue home medications  Recent influenza A -More than 2 weeks ago, status post completed Tamiflu   Scheduled Meds: . amLODipine  10 mg Oral Daily  . brimonidine  1 drop Both Eyes BID  . calcium acetate  2,668 mg Oral TID WC  . chlorhexidine  60 mL Topical Once  . Chlorhexidine Gluconate Cloth  6 each Topical Q0600  . [START ON 05/15/2018] doxercalciferol  7 mcg Intravenous Q M,W,F-HD  . feeding supplement (ENSURE ENLIVE)  237 mL Oral TID BM  . feeding supplement (PRO-STAT SUGAR FREE 64)  30 mL Oral BID  . heparin  5,000 Units Subcutaneous Q8H  . insulin aspart  0-5 Units Subcutaneous QHS  . insulin aspart  0-9 Units Subcutaneous TID WC  . metoCLOPramide  5 mg Oral TID AC  . metoprolol succinate  100 mg Oral Daily  . multivitamin  1 tablet Oral QHS  . pantoprazole  40 mg Oral Daily  . povidone-iodine  2 application Topical Once  . traZODone  50 mg Oral Once   Continuous Infusions: . sodium chloride    . sodium chloride    . meropenem (MERREM) IV    . vancomycin  PRN Meds:.sodium chloride, sodium chloride, acetaminophen, albuterol, guaiFENesin-dextromethorphan, heparin, lidocaine (PF), lidocaine-prilocaine, ondansetron, pentafluoroprop-tetrafluoroeth  DVT prophylaxis: Heparin Code Status: Full code Family Communication: No family present at bedside, discussed with wife [name] over the phone [phone]) Disposition Plan: To be determined  Consultants:   Orthopedic surgery  Vascular surgery  Nephrology  Procedures:    None  Antimicrobials:  Vancomycin/Zosyn 2/21--2/22  Ceftriaxone/azithromycin 2/22--2/23  Vancomycin/meropenem 2/23--  Objective: Vitals:   05/13/18 2111 05/14/18 0006 05/14/18 0453 05/14/18 0748  BP: 108/68 119/68 112/71 136/67  Pulse: (!) 110 (!) 102 (!) 109 (!) 105  Resp: 20 20 20 19   Temp: 100 F (37.8 C) 99.2 F (37.3 C) 98.5 F (36.9 C) (!) 101.1 F (38.4 C)  TempSrc: Oral Oral Oral Oral  SpO2: 97% 94% 98% 91%  Weight:      Height:        Intake/Output Summary (Last 24 hours) at 05/14/2018 1045 Last data filed at 05/14/2018 0600 Gross per 24 hour  Intake 460 ml  Output 3000 ml  Net -2540 ml   Filed Weights   05/12/18 2020 05/13/18 1046 05/13/18 1513  Weight: 99.1 kg 98.3 kg 94.1 kg    Examination:  Constitutional: He is in no distress Eyes: No scleral icterus noted ENMT: Moist mucous membranes Respiratory: Mostly clear to auscultation without wheezing, no crackles, no rhonchi Cardiovascular: Regular rate and rhythm, no peripheral edema Abdomen: Soft, nontender, nondistended, bowel sounds positive Musculoskeletal: no clubbing Skin: Dark gangrenous wound extending through half of the plantar area as well as fourth toe with surrounding cellulitis Neurologic: No focal deficits, ambulatory   Data Reviewed: I have independently reviewed following labs and imaging studies   CBC: Recent Labs  Lab 05/12/18 0950 05/13/18 1128  WBC 15.4* 18.9*  NEUTROABS 12.1*  --   HGB 10.2* 10.0*  HCT 32.2* 31.0*  MCV 93.6 92.3  PLT 280 433   Basic Metabolic Panel: Recent Labs  Lab 05/12/18 0950 05/13/18 1128  NA 133* 132*  K 4.9 5.0  CL 90* 88*  CO2 26 25  GLUCOSE 130* 203*  BUN 45* 62*  CREATININE 11.91* 13.79*  CALCIUM 8.1* 8.2*  PHOS  --  8.8*   GFR: Estimated Creatinine Clearance: 7.6 mL/min (A) (by C-G formula based on SCr of 13.79 mg/dL (H)). Liver Function Tests: Recent Labs  Lab 05/12/18 0950 05/13/18 1128  AST 16  --   ALT 11  --    ALKPHOS 83  --   BILITOT 1.0  --   PROT 8.2*  --   ALBUMIN 2.7* 2.3*   No results for input(s): LIPASE, AMYLASE in the last 168 hours. No results for input(s): AMMONIA in the last 168 hours. Coagulation Profile: No results for input(s): INR, PROTIME in the last 168 hours. Cardiac Enzymes: No results for input(s): CKTOTAL, CKMB, CKMBINDEX, TROPONINI in the last 168 hours. BNP (last 3 results) No results for input(s): PROBNP in the last 8760 hours. HbA1C: Recent Labs    05/12/18 1629  HGBA1C 8.3*   CBG: Recent Labs  Lab 05/12/18 2025 05/13/18 0726 05/13/18 1603 05/13/18 2139 05/14/18 0743  GLUCAP 102* 118* 126* 189* 176*   Lipid Profile: No results for input(s): CHOL, HDL, LDLCALC, TRIG, CHOLHDL, LDLDIRECT in the last 72 hours. Thyroid Function Tests: No results for input(s): TSH, T4TOTAL, FREET4, T3FREE, THYROIDAB in the last 72 hours. Anemia Panel: No results for input(s): VITAMINB12, FOLATE, FERRITIN, TIBC, IRON, RETICCTPCT in the last 72 hours. Urine analysis:    Component Value  Date/Time   COLORURINE RED (A) 11/19/2016 0020   APPEARANCEUR TURBID (A) 11/19/2016 0020   LABSPEC  11/19/2016 0020    TEST NOT REPORTED DUE TO COLOR INTERFERENCE OF URINE PIGMENT   PHURINE  11/19/2016 0020    TEST NOT REPORTED DUE TO COLOR INTERFERENCE OF URINE PIGMENT   GLUCOSEU (A) 11/19/2016 0020    TEST NOT REPORTED DUE TO COLOR INTERFERENCE OF URINE PIGMENT   HGBUR (A) 11/19/2016 0020    TEST NOT REPORTED DUE TO COLOR INTERFERENCE OF URINE PIGMENT   BILIRUBINUR (A) 11/19/2016 0020    TEST NOT REPORTED DUE TO COLOR INTERFERENCE OF URINE PIGMENT   KETONESUR (A) 11/19/2016 0020    TEST NOT REPORTED DUE TO COLOR INTERFERENCE OF URINE PIGMENT   PROTEINUR (A) 11/19/2016 0020    TEST NOT REPORTED DUE TO COLOR INTERFERENCE OF URINE PIGMENT   NITRITE (A) 11/19/2016 0020    TEST NOT REPORTED DUE TO COLOR INTERFERENCE OF URINE PIGMENT   LEUKOCYTESUR (A) 11/19/2016 0020    TEST NOT  REPORTED DUE TO COLOR INTERFERENCE OF URINE PIGMENT   Sepsis Labs: Invalid input(s): PROCALCITONIN, LACTICIDVEN  Recent Results (from the past 240 hour(s))  Blood culture (routine x 2)     Status: None (Preliminary result)   Collection Time: 05/12/18  9:50 AM  Result Value Ref Range Status   Specimen Description BLOOD RIGHT ANTECUBITAL  Final   Special Requests   Final    BOTTLES DRAWN AEROBIC AND ANAEROBIC Blood Culture adequate volume   Culture NO GROWTH 2 DAYS  Final   Report Status PENDING  Incomplete  Blood culture (routine x 2)     Status: Abnormal (Preliminary result)   Collection Time: 05/12/18 10:00 AM  Result Value Ref Range Status   Specimen Description BLOOD RIGHT FOREARM  Final   Special Requests   Final    BOTTLES DRAWN AEROBIC AND ANAEROBIC Blood Culture adequate volume   Culture  Setup Time   Final    ABUNDANT GRAM NEGATIVE RODS CRITICAL RESULT CALLED TO, READ BACK BY AND VERIFIED WITH: L SEAY @ 0222 ON 05/13/18 BY ROBINSON Z. WITH BCID RESULTS    Culture PROTEUS MIRABILIS (A)  Final   Report Status PENDING  Incomplete  Blood Culture ID Panel (Reflexed)     Status: Abnormal   Collection Time: 05/12/18 10:00 AM  Result Value Ref Range Status   Enterococcus species NOT DETECTED NOT DETECTED Final   Listeria monocytogenes NOT DETECTED NOT DETECTED Final   Staphylococcus species NOT DETECTED NOT DETECTED Final   Staphylococcus aureus (BCID) NOT DETECTED NOT DETECTED Final   Streptococcus species NOT DETECTED NOT DETECTED Final   Streptococcus agalactiae NOT DETECTED NOT DETECTED Final   Streptococcus pneumoniae NOT DETECTED NOT DETECTED Final   Streptococcus pyogenes NOT DETECTED NOT DETECTED Final   Acinetobacter baumannii NOT DETECTED NOT DETECTED Final   Enterobacteriaceae species DETECTED (A) NOT DETECTED Final    Comment: Enterobacteriaceae represent a large family of gram-negative bacteria, not a single organism. CRITICAL RESULT CALLED TO, READ BACK BY AND  VERIFIED WITH: L SEAY @ 0222 ON 05/13/18 BY ROBINSON Z.     Enterobacter cloacae complex NOT DETECTED NOT DETECTED Final   Escherichia coli NOT DETECTED NOT DETECTED Final   Klebsiella oxytoca NOT DETECTED NOT DETECTED Final   Klebsiella pneumoniae NOT DETECTED NOT DETECTED Final   Proteus species DETECTED (A) NOT DETECTED Final    Comment: CRITICAL RESULT CALLED TO, READ BACK BY AND VERIFIED WITH: L SEAY @  0222 ON 05/13/18 BY ROBINSON Z.    Serratia marcescens NOT DETECTED NOT DETECTED Final   Carbapenem resistance NOT DETECTED NOT DETECTED Final   Haemophilus influenzae NOT DETECTED NOT DETECTED Final   Neisseria meningitidis NOT DETECTED NOT DETECTED Final   Pseudomonas aeruginosa NOT DETECTED NOT DETECTED Final   Candida albicans NOT DETECTED NOT DETECTED Final   Candida glabrata NOT DETECTED NOT DETECTED Final   Candida krusei NOT DETECTED NOT DETECTED Final   Candida parapsilosis NOT DETECTED NOT DETECTED Final   Candida tropicalis NOT DETECTED NOT DETECTED Final  MRSA PCR Screening     Status: Abnormal   Collection Time: 05/12/18  5:01 PM  Result Value Ref Range Status   MRSA by PCR POSITIVE (A) NEGATIVE Final    Comment:        The GeneXpert MRSA Assay (FDA approved for NASAL specimens only), is one component of a comprehensive MRSA colonization surveillance program. It is not intended to diagnose MRSA infection nor to guide or monitor treatment for MRSA infections. RESULT CALLED TO, READ BACK BY AND VERIFIED WITH: Leandro Reasoner RN 05/12/18 2116 JDW Performed at [facility], [place] [address] [place], [place] [address]       Radiology Studies: No results found.  Marzetta Board, MD, PhD [facility]  Contact via  [email or web address]  [place] P: [phone]  F: [phone]

## 2018-05-14 NOTE — Transfer of Care (Signed)
Immediate Anesthesia Transfer of Care Note  Patient: Howard Bell  Procedure(s) Performed: AMPUTATION BELOW KNEE (Right Leg Lower)  Patient Location: PACU  Anesthesia Type:MAC and Regional  Level of Consciousness: awake, alert  and oriented  Airway & Oxygen Therapy: Patient Spontanous Breathing and Patient connected to nasal cannula oxygen  Post-op Assessment: Report given to RN and Post -op Vital signs reviewed and stable  Post vital signs: Reviewed and stable  Last Vitals:  Vitals Value Taken Time  BP 88/49 05/14/2018  5:08 PM  Temp    Pulse 81 05/14/2018  5:08 PM  Resp 29 05/14/2018  5:09 PM  SpO2 100 % 05/14/2018  5:08 PM  Vitals shown include unvalidated device data.  Last Pain:  Vitals:   05/14/18 1426  TempSrc: Oral  PainSc:          Complications: No apparent anesthesia complications

## 2018-05-14 NOTE — Anesthesia Procedure Notes (Signed)
Anesthesia Regional Block: Adductor canal block   Pre-Anesthetic Checklist: ,, timeout performed, Correct Patient, Correct Site, Correct Laterality, Correct Procedure,, site marked, risks and benefits discussed, Surgical consent,  Pre-op evaluation,  At surgeon's request and post-op pain management  Laterality: Right  Prep: chloraprep       Needles:  Injection technique: Single-shot  Needle Type: Echogenic Stimulator Needle     Needle Length: 9cm  Needle Gauge: 21     Additional Needles:   Procedures:,,,, ultrasound used (permanent image in chart),,,,  Narrative:  Start time: 05/14/2018 3:40 PM End time: 05/14/2018 3:45 PM Injection made incrementally with aspirations every 5 mL.  Performed by: Personally  Anesthesiologist: Murvin Natal, MD  Additional Notes: Functioning IV was confirmed and monitors were applied. A time-out was performed. Hand hygiene and sterile gloves were used. The thigh was placed in a frog-leg position and prepped in a sterile fashion. A 43mm 21ga Arrow echogenic stimulator needle was placed using ultrasound guidance.  Negative aspiration and negative test dose prior to incremental administration of local anesthetic. The patient tolerated the procedure well.

## 2018-05-14 NOTE — H&P (View-Only) (Signed)
Patient ID: Howard Bell, male   DOB: Jul 12, 1965, 53 y.o.   MRN: [id number] Patient is seen in follow-up for gangrene involving the right foot.  Patient has had fever and chills with systemic symptoms from the gangrenous changes to the right foot.  Vascular surgery has evaluated the patient and felt that he has adequate circulation for a below the knee amputation.  Consultation with Dr. [name] he also feels the patient requires a transtibial amputation.  I have discussed recommendations with the patient's wife.  Patient states he wants to discuss this with his father and his wife prior to making a decision.  Will make patient n.p.o. at this time plan for surgery this afternoon.  Patient will need discharge to skilled nursing postoperatively.

## 2018-05-14 NOTE — Interval H&P Note (Signed)
History and Physical Interval Note:  05/14/2018 3:51 PM  Howard Bell  has presented today for surgery, with the diagnosis of GANGRENE RIGHT LEG  The various methods of treatment have been discussed with the patient and family. After consideration of risks, benefits and other options for treatment, the patient has consented to  Procedure(s): AMPUTATION BELOW KNEE (Right) as a surgical intervention .  The patient's history has been reviewed, patient examined, no change in status, stable for surgery.  I have reviewed the patient's chart and labs.  Questions were answered to the patient's satisfaction.     Newt Minion

## 2018-05-14 NOTE — Progress Notes (Addendum)
[facility] Progress Note   Subjective: Told by primary team that he will need BKA. Patient told primary MD "Hell NO!" but now talking to his father and re-considering. Asking for second opinion but has already been seen by vascular and orthopedics. Says he still has cough but denies SOB. Noted to have elevated HR during HD yesterday.   Objective Vitals:   05/13/18 2111 05/14/18 0006 05/14/18 0453 05/14/18 0748  BP: 108/68 119/68 112/71 136/67  Pulse: (!) 110 (!) 102 (!) 109 (!) 105  Resp: 20 20 20 19   Temp: 100 F (37.8 C) 99.2 F (37.3 C) 98.5 F (36.9 C) (!) 101.1 F (38.4 C)  TempSrc: Oral Oral Oral Oral  SpO2: 97% 94% 98% 91%  Weight:      Height:       Physical Exam General: WN,WD male in NAD Heart: S1,S2, RRR Tachy. ST on monitor.  Lungs: Coarse upper airway scattered rhonchi. Decreased in bases bilaterally Abdomen: S, NT, active BS Extremities: No LE edema. Full thickness gangrenous ulcer dorsal aspect of R foot and 4th toe. Malodorous, no drainage.  Dialysis Access: R AVG + Bruit.    Additional Objective Labs: Basic Metabolic Panel: Recent Labs  Lab 05/12/18 0950 05/13/18 1128  NA 133* 132*  K 4.9 5.0  CL 90* 88*  CO2 26 25  GLUCOSE 130* 203*  BUN 45* 62*  CREATININE 11.91* 13.79*  CALCIUM 8.1* 8.2*  PHOS  --  8.8*   Liver Function Tests: Recent Labs  Lab 05/12/18 0950 05/13/18 1128  AST 16  --   ALT 11  --   ALKPHOS 83  --   BILITOT 1.0  --   PROT 8.2*  --   ALBUMIN 2.7* 2.3*   No results for input(s): LIPASE, AMYLASE in the last 168 hours. CBC: Recent Labs  Lab 05/12/18 0950 05/13/18 1128  WBC 15.4* 18.9*  NEUTROABS 12.1*  --   HGB 10.2* 10.0*  HCT 32.2* 31.0*  MCV 93.6 92.3  PLT 280 247   Blood Culture    Component Value Date/Time   SDES BLOOD RIGHT FOREARM 05/12/2018 1000   SPECREQUEST  05/12/2018 1000    BOTTLES DRAWN AEROBIC AND ANAEROBIC Blood Culture adequate volume   CULT NO GROWTH 2 DAYS 05/12/2018 1000    REPTSTATUS PENDING 05/12/2018 1000    Cardiac Enzymes: No results for input(s): CKTOTAL, CKMB, CKMBINDEX, TROPONINI in the last 168 hours. CBG: Recent Labs  Lab 05/12/18 2025 05/13/18 0726 05/13/18 1603 05/13/18 2139 05/14/18 0743  GLUCAP 102* 118* 126* 189* 176*   Iron Studies: No results for input(s): IRON, TIBC, TRANSFERRIN, FERRITIN in the last 72 hours. @lablastinr3 @ Studies/Results: Dg Chest 2 View  Result Date: 05/12/2018 CLINICAL DATA:  Productive cough.  Fever and nausea. EXAM: CHEST - 2 VIEW COMPARISON:  Chest x-ray and chest CTs dated 05/02/2018, 11/09/2017 and 11/06/2017 FINDINGS: There is persistent haziness in the right lung base posteriorly consistent with the hazy infiltrate seen on the prior study. There is persistent fullness in the mediastinum, demonstrated to be adenopathy on multiple prior chest CTs. Heart size and pulmonary vascularity are normal. Stent in the left subclavian vein. Left lung is clear. IMPRESSION: 1. Persistent faint haziness at the right lung base posteriorly. Persistent mediastinal adenopathy. 2. Given the patient's clinical symptoms, the findings at the right lung base could represent recurrent pneumonia in the right lower lobe. 3. The chronic mediastinal adenopathy raises the possibility of sarcoidosis. Electronically Signed   By: Lorriane Shire  M.D.   On: 05/12/2018 10:58   Dg Foot Complete Right  Result Date: 05/12/2018 CLINICAL DATA:  Fever in a diabetic patient with a skin wound on the right foot. EXAM: RIGHT FOOT COMPLETE - 3+ VIEW COMPARISON:  Plain films right foot 04/13/2010 FINDINGS: No bony destructive change or periosteal reaction is identified. There is no fracture or dislocation. No evidence of arthropathy. No soft tissue gas or radiopaque foreign body. Extensive vascular calcifications are noted. IMPRESSION: No acute abnormality. Atherosclerosis. Electronically Signed   By: Inge Rise M.D.   On: 05/12/2018 10:51    Medications: . sodium chloride    . sodium chloride     . amLODipine  10 mg Oral Daily  . brimonidine  1 drop Both Eyes BID  . calcium acetate  2,668 mg Oral TID WC  . Chlorhexidine Gluconate Cloth  6 each Topical Q0600  . [START ON 05/15/2018] doxercalciferol  7 mcg Intravenous Q M,W,F-HD  . feeding supplement (ENSURE ENLIVE)  237 mL Oral TID BM  . feeding supplement (PRO-STAT SUGAR FREE 64)  30 mL Oral BID  . heparin  5,000 Units Subcutaneous Q8H  . insulin aspart  0-5 Units Subcutaneous QHS  . insulin aspart  0-9 Units Subcutaneous TID WC  . metoCLOPramide  5 mg Oral TID AC  . multivitamin  1 tablet Oral QHS  . pantoprazole  40 mg Oral Daily  . traZODone  50 mg Oral Once     [facility] MWF 4 hr 15 min 180 NRe 450/autoflow 2.0 97.5 kg 2.0 K/2.25 Ca UFP 2  R AVG -Heparin 9000 units IV TIW -Parsabiv 10 mcg IV TIW (Not on hospital formulary) -Hectorol 7 mcg IV TIW   Assessment/Plan: 1. Sepsis / Proteus bacteremia: BC  Positive for GNR. Unclear source of infection but probably wound R foot. On Ceftriaxone, Azithromycin per primary. WBC 18.9 2. RLL PNA: Per primary.  3. Gangrenous wound R foot-seen by Dr. Sharol Given, Dr. Doren Custard who do not believe foot is salvageable. 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 Per primary/ortho. Patient refusing. Discussed with patient and wife again.  4. ESRD -MWF-Next HD tomorrow. K+ 5.0. Use 2.0 K bath, usual heparin.  5. Anemia -HGB 10.0 No ESA/Fe as OP. Follow trend.  6. Secondary hyperparathyroidism - Ca 8.1 C Ca 9.1  Phos down to 8.8. Continue VDRA, binders. Parsabiv not available at hospital. Non-compliant with binders as OP, last phos 10.2 05/10/18.  7. HTN/volume -Had issues with elevated HR and hypotension during HD yesterday. Pre wt 98.3 kg Net UF 3.0 liters post wt 94 kgs. Will need lower EDW on DC. UFG 2-3 liters tomorrow. Amlodipine 10 mg ordered. He is also on metoprolol succ 100 mg PO daily which has not been ordered. Will resume today.  8. Nutrition - Renal/Carb mod  diet. Albumin 2.3. Add prostat, renal vits.  9. DM-per primary  Jimmye Norman. Brown NP-C 05/14/2018, 9:19 AM  [facility] [phone]  Pt seen, examined and agree w A/P as above. [facility] 05/14/2018, 5:09 PM

## 2018-05-14 NOTE — Progress Notes (Signed)
Pharmacy Antibiotic Note  Howard Bell is a 53 y.o. male admitted on 05/12/2018 with sepsis.  Pharmacy has been consulted for vancomycin and meropenem dosing for gangrene R foot. Blood culture positive for proteus species on 2/21. Patient recently received vancomycin loading dose 2/21 and received HD 2/22. Current vancomycin level estimated at 13.1  Plan: Stop CTX Start meropenem 500 q24h Start Vancomycin 750mg  x1 Will plan to give replacement vancomycin 1000mg  after each HD session Monitor HD plans, clinical improvement, VT as indicated F/u susceptibilities for Proteus  Height: 6' (182.9 cm) Weight: 207 lb 7.3 oz (94.1 kg) IBW/kg (Calculated) : 77.6  Temp (24hrs), Avg:99.2 F (37.3 C), Min:98.3 F (36.8 C), Max:101.1 F (38.4 C)  Recent Labs  Lab 05/12/18 0950 05/12/18 1001 05/12/18 1629 05/13/18 1128  WBC 15.4*  --   --  18.9*  CREATININE 11.91*  --   --  13.79*  LATICACIDVEN  --  1.4 1.5  --     Estimated Creatinine Clearance: 7.6 mL/min (A) (by C-G formula based on SCr of 13.79 mg/dL (H)).    No Known Allergies  Antimicrobials this admission: CTX 2/21 >> Azithro 2/22 >> 2/26 Vanc 2/21 >> 2/21; 2/23>> Zosyn 2/21>>2/21 Meropenem 2/23 >>  Microbiology results: MRSA PCR + Bcx: Proteus (sensitivity to follow)  Thank you for allowing pharmacy to be a part of this patient's care.  Janae Bridgeman, PharmD PGY1 Pharmacy Resident Phone: [phone] 05/14/2018 9:36 AM 05/14/2018 9:21 AM

## 2018-05-15 ENCOUNTER — Encounter (HOSPITAL_COMMUNITY): Payer: Self-pay | Admitting: Orthopedic Surgery

## 2018-05-15 LAB — CBC
HCT: 29.3 % — ABNORMAL LOW (ref 39.0–52.0)
Hemoglobin: 9 g/dL — ABNORMAL LOW (ref 13.0–17.0)
MCH: 28.8 pg (ref 26.0–34.0)
MCHC: 30.7 g/dL (ref 30.0–36.0)
MCV: 93.6 fL (ref 80.0–100.0)
Platelets: 246 10*3/uL (ref 150–400)
RBC: 3.13 MIL/uL — ABNORMAL LOW (ref 4.22–5.81)
RDW: 14.5 % (ref 11.5–15.5)
WBC: 12.6 10*3/uL — ABNORMAL HIGH (ref 4.0–10.5)
nRBC: 0 % (ref 0.0–0.2)

## 2018-05-15 LAB — RENAL FUNCTION PANEL
Albumin: 2.5 g/dL — ABNORMAL LOW (ref 3.5–5.0)
Anion gap: 12 (ref 5–15)
BUN: 28 mg/dL — ABNORMAL HIGH (ref 6–20)
CO2: 28 mmol/L (ref 22–32)
Calcium: 8.5 mg/dL — ABNORMAL LOW (ref 8.9–10.3)
Chloride: 93 mmol/L — ABNORMAL LOW (ref 98–111)
Creatinine, Ser: 6.46 mg/dL — ABNORMAL HIGH (ref 0.61–1.24)
GFR calc Af Amer: 11 mL/min — ABNORMAL LOW (ref >60)
GFR calc non Af Amer: 9 mL/min — ABNORMAL LOW (ref >60)
Glucose, Bld: 135 mg/dL — ABNORMAL HIGH (ref 70–99)
Phosphorus: 4.2 mg/dL (ref 2.5–4.6)
Potassium: 3.5 mmol/L (ref 3.5–5.1)
Sodium: 133 mmol/L — ABNORMAL LOW (ref 135–145)

## 2018-05-15 LAB — CULTURE, BLOOD (ROUTINE X 2): Special Requests: ADEQUATE

## 2018-05-15 LAB — POCT I-STAT EG7
Acid-Base Excess: 7 mmol/L — ABNORMAL HIGH (ref 0.0–2.0)
Bicarbonate: 32.1 mmol/L — ABNORMAL HIGH (ref 20.0–28.0)
Calcium, Ion: 1.12 mmol/L — ABNORMAL LOW (ref 1.15–1.40)
HCT: 26 % — ABNORMAL LOW (ref 39.0–52.0)
Hemoglobin: 8.8 g/dL — ABNORMAL LOW (ref 13.0–17.0)
O2 Saturation: 42 %
Patient temperature: 37
Potassium: 4.5 mmol/L (ref 3.5–5.1)
Sodium: 133 mmol/L — ABNORMAL LOW (ref 135–145)
TCO2: 34 mmol/L — ABNORMAL HIGH (ref 22–32)
pCO2, Ven: 50.2 mmHg (ref 44.0–60.0)
pH, Ven: 7.414 (ref 7.250–7.430)
pO2, Ven: 24 mmHg — CL (ref 32.0–45.0)

## 2018-05-15 LAB — GLUCOSE, CAPILLARY
Glucose-Capillary: 166 mg/dL — ABNORMAL HIGH (ref 70–99)
Glucose-Capillary: 190 mg/dL — ABNORMAL HIGH (ref 70–99)
Glucose-Capillary: 122 mg/dL — ABNORMAL HIGH (ref 70–99)

## 2018-05-15 MED ORDER — DOXERCALCIFEROL 4 MCG/2ML IV SOLN
INTRAVENOUS | Status: AC
  Administered 2018-05-15: 7 ug via INTRAVENOUS
  Filled 2018-05-15: qty 4

## 2018-05-15 MED ORDER — SODIUM CHLORIDE 0.9 % IV SOLN: 100.0000 mL | INTRAVENOUS | Status: DC | PRN

## 2018-05-15 MED ORDER — HYDROMORPHONE HCL 1 MG/ML IJ SOLN
INTRAMUSCULAR | Status: AC
  Filled 2018-05-15: qty 1

## 2018-05-15 MED ORDER — SODIUM CHLORIDE 0.9 % IV SOLN
2.0000 g | INTRAVENOUS | Status: DC
  Administered 2018-05-15 – 2018-05-17 (×3): 2 g via INTRAVENOUS
  Filled 2018-05-15 (×5): qty 20

## 2018-05-15 MED ORDER — AMLODIPINE BESYLATE 5 MG PO TABS
5.0000 mg | ORAL_TABLET | Freq: Every day | ORAL | Status: DC
  Administered 2018-05-15 – 2018-05-17 (×3): 5 mg via ORAL
  Filled 2018-05-15 (×4): qty 1

## 2018-05-15 MED ORDER — METOPROLOL SUCCINATE ER 50 MG PO TB24
50.0000 mg | ORAL_TABLET | Freq: Every day | ORAL | Status: DC
  Administered 2018-05-15 – 2018-05-17 (×3): 50 mg via ORAL
  Filled 2018-05-15 (×2): qty 1

## 2018-05-15 MED ORDER — HEPARIN SODIUM (PORCINE) 1000 UNIT/ML DIALYSIS
9000.0000 [IU] | Freq: Once | INTRAMUSCULAR | Status: DC
  Filled 2018-05-15: qty 9

## 2018-05-15 MED ORDER — HEPARIN SODIUM (PORCINE) 1000 UNIT/ML IJ SOLN
INTRAMUSCULAR | Status: AC
  Filled 2018-05-15: qty 1

## 2018-05-15 MED ORDER — SODIUM CHLORIDE 0.9 % IV SOLN
500.0000 mg | INTRAVENOUS | Status: DC
  Administered 2018-05-15 – 2018-05-17 (×2): 500 mg via INTRAVENOUS
  Filled 2018-05-15 (×5): qty 500

## 2018-05-15 MED ORDER — RENA-VITE PO TABS: 1.0000 | ORAL_TABLET | Freq: Every day | ORAL | Status: DC

## 2018-05-15 MED ORDER — VANCOMYCIN HCL IN DEXTROSE 1-5 GM/200ML-% IV SOLN: 1000.0000 mg | INTRAVENOUS | Status: DC

## 2018-05-15 NOTE — Progress Notes (Signed)
Orthopedic Tech Progress Note Patient Details:  Howard Bell [birth year] [id number] Called into [facility]anger for brace order Patient ID: [name], male   DOB: 1965/09/26, 53 y.o.   MRN: [id number]   Janit Pagan 05/15/2018, 8:39 AM

## 2018-05-15 NOTE — Progress Notes (Signed)
Patient ID: Howard Bell, male   DOB: 1965-05-19, 53 y.o.   MRN: [id number] Patient is postoperative day 1 transtibial amputation.  There is no drainage in the wound VAC canister.  Plan for continued physical therapy evaluation for discharge to inpatient versus outpatient rehabilitation.

## 2018-05-15 NOTE — Progress Notes (Signed)
PROGRESS NOTE  Howard Bell NOM:[id number] DOB: 1966/01/01 DOA: 05/12/2018 PCP: Horald Pollen, MD   LOS: 3 days   Brief Narrative / Interim history: 53 year old male with history of type 2 diabetes mellitus on insulin, ESRD on MWF HD, hypertension, gastroparesis, chronic right foot wound, recently diagnosed with flu a status post treatment couple weeks ago presents to the hospital with febrile illness.  He was complaining of ongoing cough with sputum production as well as worsening wound which turned black on the right foot.  He was septic in the ED with fever of one 1.7, white count of 15, chest x-ray showed probable right lower lobe pneumonia.  He was admitted to the hospital.  Subjective: Doing well this morning, no significant complaints.  No fever or chills, no chest pain, no abdominal pain, nausea vomiting  Assessment & Plan: Principal Problem:   Recurrent pneumonia Active Problems:   Anemia due to end stage renal disease (HCC)   Type 2 diabetes mellitus with foot ulcer (HCC)   Essential hypertension   ESRD on dialysis ([facility])   Influenza A   Pneumonia   Gangrene of right foot (HCC)   Cellulitis of right lower extremity   Principal Problem Sepsis due to Proteus bacteremia -Source perhaps the wound although Proteus is usually associated with GI tract infections -Sensitivities came back, will narrow to ceftriaxone today  Active Problems Right lower lobe pneumonia -Continue antibiotics as above, this has been going on for the past 5 weeks, he also underwent a CT scan of the chest on 2/11 with findings consistent with an infiltrate but felt more to be related to scarring. -Currently placed on ceftriaxone and azithromycin, continue.  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 Sharol Given -Has a wound VAC in place, will likely need SNF,  awaiting PT eval  End-stage renal disease on HD -Nephrology consulted, appreciate input  Type 2 diabetes mellitus -Continue sliding scale insulin, CBGs 130s to 160s  Hypertension -Continue home medications  Recent influenza A -More than 2 weeks ago, status post completed Tamiflu   Scheduled Meds: . amLODipine  5 mg Oral QHS  . brimonidine  1 drop Both Eyes BID  . calcium acetate  2,668 mg Oral TID WC  . Chlorhexidine Gluconate Cloth  6 each Topical Q0600  . docusate sodium  100 mg Oral BID  . doxercalciferol  7 mcg Intravenous Q M,W,F-HD  . feeding supplement (ENSURE ENLIVE)  237 mL Oral TID BM  . feeding supplement (PRO-STAT SUGAR FREE 64)  30 mL Oral BID  . heparin      . heparin  5,000 Units Subcutaneous Q8H  . heparin  9,000 Units Dialysis Once in dialysis  . HYDROmorphone      . insulin aspart  0-5 Units Subcutaneous QHS  . insulin aspart  0-9 Units Subcutaneous TID WC  . metoCLOPramide  5 mg Oral TID AC  . metoprolol succinate  50 mg Oral QHS  . multivitamin  1 tablet Oral QHS  . mupirocin ointment  1 application Nasal BID  . pantoprazole  40 mg Oral Daily  . traZODone  50 mg Oral Once   Continuous Infusions: . sodium chloride    . sodium chloride    . sodium chloride    . sodium chloride    . sodium chloride    . azithromycin    . cefTRIAXone (  ROCEPHIN)  IV    . methocarbamol (ROBAXIN) IV     PRN Meds:.sodium chloride, sodium chloride, sodium chloride, sodium chloride, acetaminophen, albuterol, bisacodyl, guaiFENesin-dextromethorphan, heparin, HYDROmorphone (DILAUDID) injection, lidocaine (PF), lidocaine-prilocaine, methocarbamol **OR** methocarbamol (ROBAXIN) IV, ondansetron **OR** ondansetron (ZOFRAN) IV, oxyCODONE, oxyCODONE, pentafluoroprop-tetrafluoroeth, polyethylene glycol  DVT prophylaxis: Heparin Code Status: Full code Family Communication: No family present at bedside, discussed with wife [name] over the phone [phone]) on 2/23 Disposition  Plan: To be determined, likely needs SNF  Consultants:   Orthopedic surgery  Vascular surgery  Nephrology  Procedures:   None  Antimicrobials:  Vancomycin/Zosyn 2/21--2/22  Ceftriaxone/azithromycin 2/22--2/23  Vancomycin/meropenem 2/23--2/24  Ceftriaxone/azithromycin 2/24--  Objective: Vitals:   05/15/18 0830 05/15/18 0900 05/15/18 0930 05/15/18 1000  BP: 104/63 (!) 109/52 (!) 95/55 (!) 94/57  Pulse: 84 81 84 82  Resp:      Temp:      TempSrc:      SpO2:      Weight:      Height:        Intake/Output Summary (Last 24 hours) at 05/15/2018 1054 Last data filed at 05/15/2018 0900 Gross per 24 hour  Intake 780 ml  Output 100 ml  Net 680 ml   Filed Weights   05/13/18 1046 05/13/18 1513 05/15/18 0739  Weight: 98.3 kg 94.1 kg 96.6 kg    Examination:  Constitutional: No distress Eyes: No scleral icterus noted ENMT: mmm Respiratory: Clear to auscultation bilaterally, no wheezing, no crackles, no rhonchi.  Decreased breath sounds at the bases Cardiovascular: Regular rate and rhythm, no peripheral edema Abdomen: Soft, NT, ND, BS positive Musculoskeletal: No clubbing, status post right BKA with wound VAC in place Skin: No rashes seen Neurologic: Nonfocal, equal strength   Data Reviewed: I have independently reviewed following labs and imaging studies   CBC: Recent Labs  Lab 05/12/18 0950 05/13/18 1128 05/14/18 1520 05/15/18 0921  WBC 15.4* 18.9*  --  12.6*  NEUTROABS 12.1*  --   --   --   HGB 10.2* 10.0* 8.8* 9.0*  HCT 32.2* 31.0* 26.0* 29.3*  MCV 93.6 92.3  --  93.6  PLT 280 247  --  778   Basic Metabolic Panel: Recent Labs  Lab 05/12/18 0950 05/13/18 1128 05/14/18 1520 05/15/18 0922  NA 133* 132* 133* 133*  K 4.9 5.0 4.5 3.5  CL 90* 88*  --  93*  CO2 26 25  --  28  GLUCOSE 130* 203*  --  135*  BUN 45* 62*  --  28*  CREATININE 11.91* 13.79*  --  6.46*  CALCIUM 8.1* 8.2*  --  8.5*  PHOS  --  8.8*  --  4.2   GFR: Estimated Creatinine  Clearance: 16.5 mL/min (A) (by C-G formula based on SCr of 6.46 mg/dL (H)). Liver Function Tests: Recent Labs  Lab 05/12/18 0950 05/13/18 1128 05/15/18 0922  AST 16  --   --   ALT 11  --   --   ALKPHOS 83  --   --   BILITOT 1.0  --   --   PROT 8.2*  --   --   ALBUMIN 2.7* 2.3* 2.5*   No results for input(s): LIPASE, AMYLASE in the last 168 hours. No results for input(s): AMMONIA in the last 168 hours. Coagulation Profile: No results for input(s): INR, PROTIME in the last 168 hours. Cardiac Enzymes: No results for input(s): CKTOTAL, CKMB, CKMBINDEX, TROPONINI in the last 168 hours. BNP (last 3 results) No results  for input(s): PROBNP in the last 8760 hours. HbA1C: Recent Labs    05/12/18 1629  HGBA1C 8.3*   CBG: Recent Labs  Lab 05/14/18 1428 05/14/18 1709 05/14/18 1854 05/14/18 2137 05/15/18 0726  GLUCAP 155* 117* 103* 157* 166*   Lipid Profile: No results for input(s): CHOL, HDL, LDLCALC, TRIG, CHOLHDL, LDLDIRECT in the last 72 hours. Thyroid Function Tests: No results for input(s): TSH, T4TOTAL, FREET4, T3FREE, THYROIDAB in the last 72 hours. Anemia Panel: No results for input(s): VITAMINB12, FOLATE, FERRITIN, TIBC, IRON, RETICCTPCT in the last 72 hours. Urine analysis:    Component Value Date/Time   COLORURINE RED (A) 11/19/2016 0020   APPEARANCEUR TURBID (A) 11/19/2016 0020   LABSPEC  11/19/2016 0020    TEST NOT REPORTED DUE TO COLOR INTERFERENCE OF URINE PIGMENT   PHURINE  11/19/2016 0020    TEST NOT REPORTED DUE TO COLOR INTERFERENCE OF URINE PIGMENT   GLUCOSEU (A) 11/19/2016 0020    TEST NOT REPORTED DUE TO COLOR INTERFERENCE OF URINE PIGMENT   HGBUR (A) 11/19/2016 0020    TEST NOT REPORTED DUE TO COLOR INTERFERENCE OF URINE PIGMENT   BILIRUBINUR (A) 11/19/2016 0020    TEST NOT REPORTED DUE TO COLOR INTERFERENCE OF URINE PIGMENT   KETONESUR (A) 11/19/2016 0020    TEST NOT REPORTED DUE TO COLOR INTERFERENCE OF URINE PIGMENT   PROTEINUR (A) 11/19/2016  0020    TEST NOT REPORTED DUE TO COLOR INTERFERENCE OF URINE PIGMENT   NITRITE (A) 11/19/2016 0020    TEST NOT REPORTED DUE TO COLOR INTERFERENCE OF URINE PIGMENT   LEUKOCYTESUR (A) 11/19/2016 0020    TEST NOT REPORTED DUE TO COLOR INTERFERENCE OF URINE PIGMENT   Sepsis Labs: Invalid input(s): PROCALCITONIN, LACTICIDVEN  Recent Results (from the past 240 hour(s))  Blood culture (routine x 2)     Status: None (Preliminary result)   Collection Time: 05/12/18  9:50 AM  Result Value Ref Range Status   Specimen Description BLOOD RIGHT ANTECUBITAL  Final   Special Requests   Final    BOTTLES DRAWN AEROBIC AND ANAEROBIC Blood Culture adequate volume   Culture NO GROWTH 2 DAYS  Final   Report Status PENDING  Incomplete  Blood culture (routine x 2)     Status: Abnormal   Collection Time: 05/12/18 10:00 AM  Result Value Ref Range Status   Specimen Description BLOOD RIGHT FOREARM  Final   Special Requests   Final    BOTTLES DRAWN AEROBIC AND ANAEROBIC Blood Culture adequate volume   Culture  Setup Time   Final    ABUNDANT GRAM NEGATIVE RODS CRITICAL RESULT CALLED TO, READ BACK BY AND VERIFIED WITH: L SEAY @ 0222 ON 05/13/18 BY ROBINSON Z. WITH BCID RESULTS    Culture PROTEUS MIRABILIS (A)  Final   Report Status 05/15/2018 FINAL  Final   Organism ID, Bacteria PROTEUS MIRABILIS  Final      Susceptibility   Proteus mirabilis - MIC*    AMPICILLIN <=2 SENSITIVE Sensitive     CEFAZOLIN <=4 SENSITIVE Sensitive     CEFEPIME <=1 SENSITIVE Sensitive     CEFTAZIDIME <=1 SENSITIVE Sensitive     CEFTRIAXONE <=1 SENSITIVE Sensitive     CIPROFLOXACIN <=0.25 SENSITIVE Sensitive     GENTAMICIN <=1 SENSITIVE Sensitive     IMIPENEM 1 SENSITIVE Sensitive     TRIMETH/SULFA <=20 SENSITIVE Sensitive     AMPICILLIN/SULBACTAM <=2 SENSITIVE Sensitive     PIP/TAZO <=4 SENSITIVE Sensitive     * PROTEUS  MIRABILIS  Blood Culture ID Panel (Reflexed)     Status: Abnormal   Collection Time: 05/12/18 10:00 AM    Result Value Ref Range Status   Enterococcus species NOT DETECTED NOT DETECTED Final   Listeria monocytogenes NOT DETECTED NOT DETECTED Final   Staphylococcus species NOT DETECTED NOT DETECTED Final   Staphylococcus aureus (BCID) NOT DETECTED NOT DETECTED Final   Streptococcus species NOT DETECTED NOT DETECTED Final   Streptococcus agalactiae NOT DETECTED NOT DETECTED Final   Streptococcus pneumoniae NOT DETECTED NOT DETECTED Final   Streptococcus pyogenes NOT DETECTED NOT DETECTED Final   Acinetobacter baumannii NOT DETECTED NOT DETECTED Final   Enterobacteriaceae species DETECTED (A) NOT DETECTED Final    Comment: Enterobacteriaceae represent a large family of gram-negative bacteria, not a single organism. CRITICAL RESULT CALLED TO, READ BACK BY AND VERIFIED WITH: L SEAY @ 0222 ON 05/13/18 BY ROBINSON Z.     Enterobacter cloacae complex NOT DETECTED NOT DETECTED Final   Escherichia coli NOT DETECTED NOT DETECTED Final   Klebsiella oxytoca NOT DETECTED NOT DETECTED Final   Klebsiella pneumoniae NOT DETECTED NOT DETECTED Final   Proteus species DETECTED (A) NOT DETECTED Final    Comment: CRITICAL RESULT CALLED TO, READ BACK BY AND VERIFIED WITH: L SEAY @ 0222 ON 05/13/18 BY ROBINSON Z.    Serratia marcescens NOT DETECTED NOT DETECTED Final   Carbapenem resistance NOT DETECTED NOT DETECTED Final   Haemophilus influenzae NOT DETECTED NOT DETECTED Final   Neisseria meningitidis NOT DETECTED NOT DETECTED Final   Pseudomonas aeruginosa NOT DETECTED NOT DETECTED Final   Candida albicans NOT DETECTED NOT DETECTED Final   Candida glabrata NOT DETECTED NOT DETECTED Final   Candida krusei NOT DETECTED NOT DETECTED Final   Candida parapsilosis NOT DETECTED NOT DETECTED Final   Candida tropicalis NOT DETECTED NOT DETECTED Final  MRSA PCR Screening     Status: Abnormal   Collection Time: 05/12/18  5:01 PM  Result Value Ref Range Status   MRSA by PCR POSITIVE (A) NEGATIVE Final    Comment:         The GeneXpert MRSA Assay (FDA approved for NASAL specimens only), is one component of a comprehensive MRSA colonization surveillance program. It is not intended to diagnose MRSA infection nor to guide or monitor treatment for MRSA infections. RESULT CALLED TO, READ BACK BY AND VERIFIED WITH: Leandro Reasoner RN 05/12/18 2116 JDW Performed at [facility], [place] [address] [place], [place] [address]   Surgical pcr screen     Status: Abnormal   Collection Time: 05/14/18  9:39 AM  Result Value Ref Range Status   MRSA, PCR POSITIVE (A) NEGATIVE Final    Comment: RESULT CALLED TO, READ BACK BY AND VERIFIED WITH: A.RIA RN AT 1215 05/14/18 BY A.DAVIS    Staphylococcus aureus POSITIVE (A) NEGATIVE Final    Comment: (NOTE) The Xpert SA Assay (FDA approved for NASAL specimens in patients 45 years of age and older), is one component of a comprehensive surveillance program. It is not intended to diagnose infection nor to guide or monitor treatment. Performed at [facility], [place] [address] [place], [place] [address]       Radiology Studies: No results found.  Marzetta Board, MD, PhD [facility]  Contact via  [email or web address]  [place] P: [phone]  F: [phone]

## 2018-05-15 NOTE — Progress Notes (Signed)
Pharmacy Antibiotic Note  Howard Bell is a 53 y.o. male admitted on 05/12/2018 with sepsis.  Pharmacy has been consulted for vancomycin and meropenem dosing for gangrene R foot. Blood culture positive for proteus species on 2/21.   Proteus is pan-S. Patient is also s/p BKA. D/w Dr. Cruzita Lederer. Will deescalated meropenem to ceftriaxone and add azithromycin for atypical respiratory coverage. Will d/c vancomycin now that patient is s/p BKA.   Pharmacy will sign off.   Plan: Restart Ceftriaxone 2gm IV q24h Start azithromycin IV 500mg  q24h D/C meropenem D/C Vancomycin   Height: 6' (182.9 cm) Weight: 213 lb (96.6 kg) IBW/kg (Calculated) : 77.6  Temp (24hrs), Avg:98 F (36.7 C), Min:97.3 F (36.3 C), Max:98.5 F (36.9 C)  Recent Labs  Lab 05/12/18 0950 05/12/18 1001 05/12/18 1629 05/13/18 1128 05/15/18 0921 05/15/18 0922  WBC 15.4*  --   --  18.9* 12.6*  --   CREATININE 11.91*  --   --  13.79*  --  6.46*  LATICACIDVEN  --  1.4 1.5  --   --   --     Estimated Creatinine Clearance: 16.5 mL/min (A) (by C-G formula based on SCr of 6.46 mg/dL (H)).    No Known Allergies  Antimicrobials this admission: CTX 2/21 >>2/23, 2/24>> Azithro 2/22 >> 2/23, 2/24>> Vanc 2/21; 2/23>>2/24 Zosyn 2/21>>2/21 Meropenem 2/23 >>2/24  Microbiology results: MRSA PCR + Bcx: Proteus pan Susceptible  Anhar Mcdermott A. Levada Dy, PharmD, [facility] Please utilize Amion for appropriate phone number to reach the unit pharmacist ([place])   05/15/2018 10:53 AM 05/15/2018 10:53 AM

## 2018-05-15 NOTE — Procedures (Signed)
I was present at this session.  I have reviewed the session itself and made appropriate changes.  HD via [place]. Access press ok. .  bp dropped early . xs sedation,bp meds,  Lovena Le Dalani Mette 2/24/20208:40 AM

## 2018-05-15 NOTE — Progress Notes (Signed)
Patient had 6 beats of Vtach. Patient asymptomatic. RN notified on call NP, [place].

## 2018-05-15 NOTE — Progress Notes (Signed)
PT Cancellation Note  Patient Details Name: Howard Bell MRN: [id number] DOB: [birth year]   Cancelled Treatment:    Reason Eval/Treat Not Completed: Patient at procedure or test/unavailable. Pt currently in HD. Will continue to follow and initiate PT evaluation when pt is available and agreeable.    Thelma Comp 05/15/2018, 8:24 AM  Rolinda Roan, PT, DPT Acute Rehabilitation Services Pager: [phone] Office: [phone]

## 2018-05-15 NOTE — Progress Notes (Signed)
Subjective: Interval History: has no complaint , sleepy post op.  Objective: Vital signs in last 24 hours: Temp:  [97.3 F (36.3 C)-98.5 F (36.9 C)] 98 F (36.7 C) (02/24 0501) Pulse Rate:  [74-87] 85 (02/24 0501) Resp:  [11-21] 20 (02/24 0501) BP: (82-140)/(45-99) 140/94 (02/24 0501) SpO2:  [89 %-100 %] 89 % (02/24 0501) Weight change:   Intake/Output from previous day: 02/23 0701 - 02/24 0700 In: 540 [P.O.:240; I.V.:300] Out: 100 [Blood:100] Intake/Output this shift: No intake/output data recorded.  General appearance: moderately obese, pale and lethargic Resp: diminished breath sounds bilaterally Cardio: S1, S2 normal and systolic murmur: systolic ejection 2/6, crescendo and decrescendo at 2nd left intercostal space GI: obese, pos bs, liver down 3 cm Extremities: AVF LUA, R bka. with vac  Lab Results: Recent Labs    05/12/18 0950 05/13/18 1128  WBC 15.4* 18.9*  HGB 10.2* 10.0*  HCT 32.2* 31.0*  PLT 280 247   BMET:  Recent Labs    05/12/18 0950 05/13/18 1128  NA 133* 132*  K 4.9 5.0  CL 90* 88*  CO2 26 25  GLUCOSE 130* 203*  BUN 45* 62*  CREATININE 11.91* 13.79*  CALCIUM 8.1* 8.2*   No results for input(s): PTH in the last 72 hours. Iron Studies: No results for input(s): IRON, TIBC, TRANSFERRIN, FERRITIN in the last 72 hours.  Studies/Results: No results found.  I have reviewed the patient's current medications.  Assessment/Plan: 1 ESRD for HD 2 HTn lower meds 3 DM needs better control 4 anemia f/u postop 5 HPTH vit D, ^ binders 6 Obesity 7 Nonadherence P HD,esa, binders, pain control, bs control    LOS: 3 days   Jeneen Rinks Arya Boxley 05/15/2018,8:41 AM

## 2018-05-16 ENCOUNTER — Other Ambulatory Visit (HOSPITAL_COMMUNITY): Payer: Medicare Other

## 2018-05-16 LAB — BASIC METABOLIC PANEL
Anion gap: 14 (ref 5–15)
BUN: 33 mg/dL — ABNORMAL HIGH (ref 6–20)
CO2: 27 mmol/L (ref 22–32)
Calcium: 9.5 mg/dL (ref 8.9–10.3)
Chloride: 91 mmol/L — ABNORMAL LOW (ref 98–111)
Creatinine, Ser: 7.83 mg/dL — ABNORMAL HIGH (ref 0.61–1.24)
GFR calc Af Amer: 8 mL/min — ABNORMAL LOW (ref >60)
GFR calc non Af Amer: 7 mL/min — ABNORMAL LOW (ref >60)
Glucose, Bld: 165 mg/dL — ABNORMAL HIGH (ref 70–99)
Potassium: 4.1 mmol/L (ref 3.5–5.1)
Sodium: 132 mmol/L — ABNORMAL LOW (ref 135–145)

## 2018-05-16 LAB — CBC
HCT: 27.8 % — ABNORMAL LOW (ref 39.0–52.0)
Hemoglobin: 8.5 g/dL — ABNORMAL LOW (ref 13.0–17.0)
MCH: 29.3 pg (ref 26.0–34.0)
MCHC: 30.6 g/dL (ref 30.0–36.0)
MCV: 95.9 fL (ref 80.0–100.0)
Platelets: 245 10*3/uL (ref 150–400)
RBC: 2.9 MIL/uL — ABNORMAL LOW (ref 4.22–5.81)
RDW: 14.4 % (ref 11.5–15.5)
WBC: 12.9 10*3/uL — ABNORMAL HIGH (ref 4.0–10.5)
nRBC: 0 % (ref 0.0–0.2)

## 2018-05-16 LAB — GLUCOSE, CAPILLARY
Glucose-Capillary: 155 mg/dL — ABNORMAL HIGH (ref 70–99)
Glucose-Capillary: 147 mg/dL — ABNORMAL HIGH (ref 70–99)
Glucose-Capillary: 157 mg/dL — ABNORMAL HIGH (ref 70–99)
Glucose-Capillary: 152 mg/dL — ABNORMAL HIGH (ref 70–99)

## 2018-05-16 MED ORDER — CHLORHEXIDINE GLUCONATE CLOTH 2 % EX PADS
6.0000 | MEDICATED_PAD | Freq: Every day | CUTANEOUS | Status: DC
  Administered 2018-05-16 – 2018-05-17 (×2): 6 via TOPICAL

## 2018-05-16 MED ORDER — DARBEPOETIN ALFA 100 MCG/0.5ML IJ SOSY
100.0000 ug | PREFILLED_SYRINGE | INTRAMUSCULAR | Status: DC
  Filled 2018-05-16: qty 0.5

## 2018-05-16 NOTE — Evaluation (Signed)
Occupational Therapy Evaluation Patient Details Name: Howard Bell MRN: [id number] DOB: 1965-12-22 Today's Date: 05/16/2018    History of Present Illness Pt is a 53 y/o male with a PMH significant for IDDM, ESRD on MWF HD, HTN, gastroparesis, chronic right foot wound, recently diagnosed with flu a status post treatment couple weeks ago presents to the hospital with febrile illness.  He was complaining of ongoing cough with sputum production as well as worsening wound which turned black on the right foot.  Chest x-ray showed probable right lower lobe pneumonia.  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   Clinical Impression   Pt s/p R BKA and now presenting with generalized weakness, post op pain around operative site and phantom pain, reduced independence in ADLs and in functional mobility. Pt required max A for ADL transfers and based on discrepancy from previous PT session where pt required less assistance, pt very deconditioned. Anticipate max A for LB ADLs, min guard for UB. Pt would benefit from skilled OT intervention and from CIR placement to maximize return to PLOF.     Follow Up Recommendations  CIR;Supervision/Assistance - 24 hour    Equipment Recommendations  None recommended by OT    Recommendations for Other Services PT consult;Speech consult;Rehab consult     Precautions / Restrictions Precautions Precautions: Fall Precaution Comments: Wound VAC, limb guard Restrictions Weight Bearing Restrictions: Yes RLE Weight Bearing: Non weight bearing      Mobility Bed Mobility Overal bed mobility: Needs Assistance Bed Mobility: Supine to Sit     Supine to sit: Min assist;HOB elevated     General bed mobility comments: min A to lift trunk  Transfers Overall transfer level: Needs assistance Equipment used: Rolling walker (2 wheeled) Transfers: Sit to/from [employer] Sit to  Stand: Max assist   Squat pivot transfers: Max assist     General transfer comment: Very max A to squat pivot toward the L to recliner     Balance Overall balance assessment: Needs assistance Sitting-balance support: No upper extremity supported;Feet supported Sitting balance-Leahy Scale: Fair     Standing balance support: Bilateral upper extremity supported;During functional activity Standing balance-Leahy Scale: Poor Standing balance comment: Reliant on UE support to maintain standing balance                           ADL either performed or assessed with clinical judgement   ADL Overall ADL's : Needs assistance/impaired Eating/Feeding: Modified independent;Bed level   Grooming: Oral care;Wash/dry face;Wash/dry hands;Bed level;Supervision/safety   Upper Body Bathing: Min guard;Sitting   Lower Body Bathing: Sit to/from stand;Maximal assistance   Upper Body Dressing : Min guard;Sitting   Lower Body Dressing: Maximal assistance;Sit to/from stand   Toilet Transfer: Maximal assistance;BSC;Requires Estate agent Details (indicate cue type and reason): Requires Bari to laterally scoot/for support Toileting- Clothing Manipulation and Hygiene: Maximal assistance;Sit to/from stand       Functional mobility during ADLs: Maximal assistance;Rolling walker General ADL Comments: max A sit <> stand from EOB, cueing for UE placement on RW. Attempted to complete stand pivot, felt it was unsafe with just 1 assist. instructed pt to sit and he was resistant. Pt then completed squat pivot to recliner with max A     Vision Baseline Vision/History: No visual deficits Patient Visual Report: No change from baseline Vision Assessment?: No apparent visual deficits  Pertinent Vitals/Pain Pain Assessment: Faces Faces Pain Scale: Hurts little more Pain Location: R residual limb  Pain Descriptors / Indicators: Operative site guarding;Discomfort Pain  Intervention(s): Limited activity within patient's tolerance     Hand Dominance Right   Extremity/Trunk Assessment Upper Extremity Assessment Upper Extremity Assessment: Generalized weakness   Lower Extremity Assessment Lower Extremity Assessment: Defer to PT evaluation RLE Deficits / Details: Decreased strength and AROM consistent with pre-op diagnosis RLE Sensation: (Hyper sensitive - phantom pain) LLE Deficits / Details: Noted decreased strength - specifically in DF   Cervical / Trunk Assessment Cervical / Trunk Assessment: Other exceptions Cervical / Trunk Exceptions: Forward head posture with rounded shoulders   Communication Communication Communication: No difficulties   Cognition Arousal/Alertness: Awake/alert Behavior During Therapy: Flat affect Overall Cognitive Status: No family/caregiver present to determine baseline cognitive functioning                                 General Comments: Pt confused and attempting to pull out IV at beginning of session, however oriented x3. Very poor safety awareness   General Comments  Pt on RA upon entry to room, SpO2 WFL. Following transfer pt dropped to 83% and 3 L Mingo was administered. Pt rebounded to 95% with 3 minutes of rest            Home Living Family/patient expects to be discharged to:: Private residence Living Arrangements: Spouse/significant other Available Help at Discharge: Family;Available 24 hours/day Type of Home: Apartment Home Access: Level entry     Home Layout: One level     Bathroom Shower/Tub: Teacher, early years/pre: Standard     Home Equipment: None          Prior Functioning/Environment Level of Independence: Independent        Comments: unclear PLOF, pt confused        OT Problem List: Decreased strength;Decreased range of motion;Decreased activity tolerance;Impaired balance (sitting and/or standing);Decreased coordination;Decreased cognition;Decreased  safety awareness;Decreased knowledge of use of DME or AE;Decreased knowledge of precautions;Pain;Increased edema;Impaired sensation;Cardiopulmonary status limiting activity      OT Treatment/Interventions: Self-care/ADL training;Neuromuscular education;Therapeutic exercise;Balance training;Patient/family education;Cognitive remediation/compensation;Therapeutic activities;Modalities;Manual therapy;Splinting;DME and/or AE instruction;Energy conservation    OT Goals(Current goals can be found in the care plan section) Acute Rehab OT Goals Patient Stated Goal: go home OT Goal Formulation: With patient Time For Goal Achievement: 05/25/18 Potential to Achieve Goals: Fair  OT Frequency: Min 3X/week              AM-PAC OT "6 Clicks" Daily Activity     Outcome Measure Help from another person eating meals?: None Help from another person taking care of personal grooming?: A Little Help from another person toileting, which includes using toliet, bedpan, or urinal?: A Lot Help from another person bathing (including washing, rinsing, drying)?: A Lot Help from another person to put on and taking off regular upper body clothing?: A Little Help from another person to put on and taking off regular lower body clothing?: A Lot 6 Click Score: 16   End of Session Equipment Utilized During Treatment: Gait belt;Rolling walker;Oxygen Nurse Communication: Mobility status  Activity Tolerance: Patient limited by fatigue Patient left: in chair;with call bell/phone within reach;with chair alarm set  OT Visit Diagnosis: Pain;Unsteadiness on feet (R26.81);Muscle weakness (generalized) (M62.81) Pain - Right/Left: Right Pain - part of body: Leg  Time: 7741-2878 OT Time Calculation (min): 25 min Charges:  OT General Charges $OT Visit: 1 Visit OT Evaluation $OT Eval Moderate Complexity: 1 Mod OT Treatments $Self Care/Home Management : 8-22 mins   Curtis Sites OTR/L  05/16/2018, 3:09 PM

## 2018-05-16 NOTE — Progress Notes (Addendum)
[facility] Progress Note   Subjective:   S/p R transtibial amputation yesterday. Still sore. Reporting some ongoing productive cough but improving. No SOB/dyspnea, CP, N/V/D. Feeling better.   Objective Vitals:   05/15/18 1501 05/15/18 2333 05/15/18 2351 05/16/18 0609  BP: 116/74 126/69  134/70  Pulse: (!) 104 90  98  Resp: (!) 22 20  20   Temp: 98.8 F (37.1 C) 98.3 F (36.8 C)  98.7 F (37.1 C)  TempSrc: Oral Oral  Oral  SpO2: (!) 82% 99% 98% 100%  Weight:      Height:       Physical Exam General: Well developed, well nourished male. Appears tired but in NAD Heart: RRR, Gr2/6 M, rubs or gallops Lungs: CTA bilaterally with decreased breath sounds b/l bases. No wheezing, rhonchi or rales.  Abdomen: Obese, Soft, non-tender, non-distended. Normoactive bowel sounds Liver down 5 cm Extremities: S/p R BKA with wound vac in place. No peripheral edema Dialysis Access: LUA AVF, + thrill  Additional Objective Labs: Basic Metabolic Panel: Recent Labs  Lab 05/13/18 1128 05/14/18 1520 05/15/18 0922 05/16/18 0523  NA 132* 133* 133* 132*  K 5.0 4.5 3.5 4.1  CL 88*  --  93* 91*  CO2 25  --  28 27  GLUCOSE 203*  --  135* 165*  BUN 62*  --  28* 33*  CREATININE 13.79*  --  6.46* 7.83*  CALCIUM 8.2*  --  8.5* 9.5  PHOS 8.8*  --  4.2  --    Liver Function Tests: Recent Labs  Lab 05/12/18 0950 05/13/18 1128 05/15/18 0922  AST 16  --   --   ALT 11  --   --   ALKPHOS 83  --   --   BILITOT 1.0  --   --   PROT 8.2*  --   --   ALBUMIN 2.7* 2.3* 2.5*   CBC: Recent Labs  Lab 05/12/18 0950 05/13/18 1128 05/14/18 1520 05/15/18 0921 05/16/18 0523  WBC 15.4* 18.9*  --  12.6* 12.9*  NEUTROABS 12.1*  --   --   --   --   HGB 10.2* 10.0* 8.8* 9.0* 8.5*  HCT 32.2* 31.0* 26.0* 29.3* 27.8*  MCV 93.6 92.3  --  93.6 95.9  PLT 280 247  --  246 245   Blood Culture    Component Value Date/Time   SDES BLOOD RIGHT FOREARM 05/12/2018 1000   SPECREQUEST  05/12/2018  1000    BOTTLES DRAWN AEROBIC AND ANAEROBIC Blood Culture adequate volume   CULT PROTEUS MIRABILIS (A) 05/12/2018 1000   REPTSTATUS 05/15/2018 FINAL 05/12/2018 1000    Cardiac Enzymes: No results for input(s): CKTOTAL, CKMB, CKMBINDEX, TROPONINI in the last 168 hours. CBG: Recent Labs  Lab 05/14/18 2137 05/15/18 0726 05/15/18 1502 05/15/18 2223 05/16/18 0722  GLUCAP 157* 166* 190* 122* 155*   Medications: . sodium chloride    . sodium chloride    . sodium chloride    . azithromycin Stopped (05/15/18 1211)  . cefTRIAXone (ROCEPHIN)  IV 2 g (05/15/18 1641)  . methocarbamol (ROBAXIN) IV     . amLODipine  5 mg Oral QHS  . brimonidine  1 drop Both Eyes BID  . calcium acetate  2,668 mg Oral TID WC  . Chlorhexidine Gluconate Cloth  6 each Topical Q0600  . docusate sodium  100 mg Oral BID  . doxercalciferol  7 mcg Intravenous Q M,W,F-HD  . feeding supplement (ENSURE ENLIVE)  237 mL Oral TID  BM  . feeding supplement (PRO-STAT SUGAR FREE 64)  30 mL Oral BID  . heparin  5,000 Units Subcutaneous Q8H  . insulin aspart  0-5 Units Subcutaneous QHS  . insulin aspart  0-9 Units Subcutaneous TID WC  . metoCLOPramide  5 mg Oral TID AC  . metoprolol succinate  50 mg Oral QHS  . multivitamin  1 tablet Oral QHS  . mupirocin ointment  1 application Nasal BID  . pantoprazole  40 mg Oral Daily    Dialysis Orders: [facility] MWF 4 hr 15 min 180 NRe 450/autoflow 2.0 97.5 kg 2.0 K/2.25 Ca UFP 2  R AVG -Heparin 9000 units IV TIW -Parsabiv 10 mcg IV TIW (Not on hospital formulary) -Hectorol 7 mcg IV TIW  Assessment/Plan: 1.Sepsis/ Proteus bacteremia: Blood culture positive for GNR. Unclear source of infection but probably wound R foot. Was on Ceftriaxone, Azithromycin, now ceftriaxone only per primary. WBC improved to 12.9. 2. RLL PNA: Cough improving. Continue ceftriaxone. Per primary.  3. Gangrenous wound R foot: s/p transtibial amputation. Wound vac in place. Management per vascular.  4.  ESRD: MWF-Next HD tomorrow. K+ 4.1. Use 2.0 K bath, will use tight heparin tomorrow due to recent surgery.  5. Anemia: HGB 8.5 post-op. No ESA/Fe as OP. Will start aranesp 100 mcg q week  6.Secondary hyperparathyroidism: Ca 9.5, Corrected Ca 11.1. Will hold hectorol and use 2 Ca bath.  Phos down to 4.2. Continue binders. Parsabiv not available at hospital. Non-compliant with binders as OP, last phos 10.2 05/10/18.  7. HTN/volume: BP improved, meds lowered yesterday. No edema on exam. Will plan on HD tomorrow per normal schedule.  8. Nutrition:Renal/Carb mod diet. Albumin 2.5. Continue prostat, renal vitamins.  9. DM-per primary  Anice Paganini, PA-C 05/16/2018, 8:47 AM  [facility] Pager: [phone] I have seen and examined this patient and agree with the plan of care  Seen , eval, examined, counseled, changes made .  Jeneen Rinks Noha Milberger 05/16/2018, 9:59 AM
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
## 2018-05-16 NOTE — Progress Notes (Signed)
PROGRESS NOTE  Howard Bell VOZ:[id number] DOB: December 21, 1965 DOA: 05/12/2018 PCP: Horald Pollen, MD   LOS: 4 days   Brief Narrative / Interim history: 53 year old male with history of type 2 diabetes mellitus on insulin, ESRD on MWF HD, hypertension, gastroparesis, chronic right foot wound, recently diagnosed with flu a status post treatment couple weeks ago presents to the hospital with febrile illness.  He was complaining of ongoing cough with sputum production as well as worsening wound which turned black on the right foot.  He was septic in the ED with fever of one 1.7, white count of 15, chest x-ray showed probable right lower lobe pneumonia.  He was admitted to the hospital.  Subjective: Continues to have a cough, denies any shortness of breath, denies any chest pain.  No abdominal pain, nausea or vomiting.  No fevers  Assessment & Plan: Principal Problem:   Recurrent pneumonia Active Problems:   Anemia due to end stage renal disease (HCC)   Type 2 diabetes mellitus with foot ulcer (HCC)   Essential hypertension   ESRD on dialysis ([place])   Influenza A   Pneumonia   Gangrene of right foot ([place])   Cellulitis of right lower extremity   Principal Problem Sepsis due to Proteus bacteremia -Source perhaps the wound although Proteus is usually associated with GI tract infections -Based on sensitivities patient was narrowed to ceftriaxone on 2/24. -He is afebrile, normotensive, his leukocytosis is improving, sepsis physiology seems to be resolving.  Can potentially be transitioned to oral antibiotics on discharge  Active Problems Right lower lobe pneumonia -Continue antibiotics, this has been going on for the past 5 weeks, he also underwent a CT scan of the chest on 2/11 with findings consistent with an infiltrate but felt more to be related to scarring. -Currently placed on ceftriaxone and azithromycin, continue.  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 Sharol Given -Has a wound VAC in place, will likely need SNF, consult social worker today -PT to evaluate today  End-stage renal disease on HD -Nephrology consulted, appreciate input  Type 2 diabetes mellitus -Continue sliding scale insulin  Hypertension -Continue home medications  Recent influenza A -More than 2 weeks ago, status post completed Tamiflu   Scheduled Meds: . amLODipine  5 mg Oral QHS  . brimonidine  1 drop Both Eyes BID  . calcium acetate  2,668 mg Oral TID WC  . Chlorhexidine Gluconate Cloth  6 each Topical Q0600  . Chlorhexidine Gluconate Cloth  6 each Topical Q0600  . [START ON 05/17/2018] darbepoetin (ARANESP) injection - DIALYSIS  100 mcg Intravenous Q Wed-HD  . docusate sodium  100 mg Oral BID  . feeding supplement (ENSURE ENLIVE)  237 mL Oral TID BM  . feeding supplement (PRO-STAT SUGAR FREE 64)  30 mL Oral BID  . heparin  5,000 Units Subcutaneous Q8H  . insulin aspart  0-5 Units Subcutaneous QHS  . insulin aspart  0-9 Units Subcutaneous TID WC  . metoCLOPramide  5 mg Oral TID AC  . metoprolol succinate  50 mg Oral QHS  . multivitamin  1 tablet Oral QHS  . mupirocin ointment  1 application Nasal BID  . pantoprazole  40 mg Oral Daily   Continuous Infusions: . sodium chloride    . sodium chloride    .  sodium chloride    . azithromycin Stopped (05/15/18 1211)  . cefTRIAXone (ROCEPHIN)  IV 2 g (05/15/18 1641)  . methocarbamol (ROBAXIN) IV     PRN Meds:.sodium chloride, sodium chloride, acetaminophen, albuterol, bisacodyl, guaiFENesin-dextromethorphan, heparin, HYDROmorphone (DILAUDID) injection, lidocaine (PF), lidocaine-prilocaine, methocarbamol **OR** methocarbamol (ROBAXIN) IV, ondansetron **OR** ondansetron (ZOFRAN) IV, oxyCODONE, oxyCODONE,  pentafluoroprop-tetrafluoroeth, polyethylene glycol  DVT prophylaxis: Heparin Code Status: Full code Family Communication: No family present at bedside, discussed with wife [name] over the phone [phone]) on 2/23 Disposition Plan: To be determined, likely needs SNF  Consultants:   Orthopedic surgery  Vascular surgery  Nephrology  Procedures:   None  Antimicrobials:  Vancomycin/Zosyn 2/21--2/22  Ceftriaxone/azithromycin 2/22--2/23  Vancomycin/meropenem 2/23--2/24  Ceftriaxone/azithromycin 2/24--  Objective: Vitals:   05/15/18 2333 05/15/18 2351 05/16/18 0609 05/16/18 0924  BP: 126/69  134/70 133/75  Pulse: 90  98 88  Resp: 20  20 20   Temp: 98.3 F (36.8 C)  98.7 F (37.1 C) 98.3 F (36.8 C)  TempSrc: Oral  Oral Oral  SpO2: 99% 98% 100% 99%  Weight:      Height:        Intake/Output Summary (Last 24 hours) at 05/16/2018 1045 Last data filed at 05/16/2018 0900 Gross per 24 hour  Intake 324.8 ml  Output 1964 ml  Net -1639.2 ml   Filed Weights   05/13/18 1513 05/15/18 0739 05/15/18 1215  Weight: 94.1 kg 96.6 kg 94.6 kg    Examination:  Constitutional: NAD Eyes: No scleral icterus ENMT: mmm Respiratory: Decreased breath sounds at the bases but no wheezing or crackles heard, no rhonchi Cardiovascular: Regular rate and rhythm, no peripheral edema Abdomen: Soft, nontender, nondistended, bowel sounds positive Musculoskeletal: No clubbing, status post right BKA with wound VAC in place Skin: No rash seen Neurologic: Equal strength in 3 extremities   Data Reviewed: I have independently reviewed following labs and imaging studies   CBC: Recent Labs  Lab 05/12/18 0950 05/13/18 1128 05/14/18 1520 05/15/18 0921 05/16/18 0523  WBC 15.4* 18.9*  --  12.6* 12.9*  NEUTROABS 12.1*  --   --   --   --   HGB 10.2* 10.0* 8.8* 9.0* 8.5*  HCT 32.2* 31.0* 26.0* 29.3* 27.8*  MCV 93.6 92.3  --  93.6 95.9  PLT 280 247  --  246 229   Basic Metabolic  Panel: Recent Labs  Lab 05/12/18 0950 05/13/18 1128 05/14/18 1520 05/15/18 0922 05/16/18 0523  NA 133* 132* 133* 133* 132*  K 4.9 5.0 4.5 3.5 4.1  CL 90* 88*  --  93* 91*  CO2 26 25  --  28 27  GLUCOSE 130* 203*  --  135* 165*  BUN 45* 62*  --  28* 33*  CREATININE 11.91* 13.79*  --  6.46* 7.83*  CALCIUM 8.1* 8.2*  --  8.5* 9.5  PHOS  --  8.8*  --  4.2  --    GFR: Estimated Creatinine Clearance: 13.5 mL/min (A) (by C-G formula based on SCr of 7.83 mg/dL (H)). Liver Function Tests: Recent Labs  Lab 05/12/18 0950 05/13/18 1128 05/15/18 0922  AST 16  --   --   ALT 11  --   --   ALKPHOS 83  --   --   BILITOT 1.0  --   --   PROT 8.2*  --   --   ALBUMIN 2.7* 2.3* 2.5*   No results for input(s): LIPASE, AMYLASE in the last 168 hours. No results for input(s): AMMONIA in  the last 168 hours. Coagulation Profile: No results for input(s): INR, PROTIME in the last 168 hours. Cardiac Enzymes: No results for input(s): CKTOTAL, CKMB, CKMBINDEX, TROPONINI in the last 168 hours. BNP (last 3 results) No results for input(s): PROBNP in the last 8760 hours. HbA1C: No results for input(s): HGBA1C in the last 72 hours. CBG: Recent Labs  Lab 05/14/18 2137 05/15/18 0726 05/15/18 1502 05/15/18 2223 05/16/18 0722  GLUCAP 157* 166* 190* 122* 155*   Lipid Profile: No results for input(s): CHOL, HDL, LDLCALC, TRIG, CHOLHDL, LDLDIRECT in the last 72 hours. Thyroid Function Tests: No results for input(s): TSH, T4TOTAL, FREET4, T3FREE, THYROIDAB in the last 72 hours. Anemia Panel: No results for input(s): VITAMINB12, FOLATE, FERRITIN, TIBC, IRON, RETICCTPCT in the last 72 hours. Urine analysis:    Component Value Date/Time   COLORURINE RED (A) 11/19/2016 0020   APPEARANCEUR TURBID (A) 11/19/2016 0020   LABSPEC  11/19/2016 0020    TEST NOT REPORTED DUE TO COLOR INTERFERENCE OF URINE PIGMENT   PHURINE  11/19/2016 0020    TEST NOT REPORTED DUE TO COLOR INTERFERENCE OF URINE PIGMENT    GLUCOSEU (A) 11/19/2016 0020    TEST NOT REPORTED DUE TO COLOR INTERFERENCE OF URINE PIGMENT   HGBUR (A) 11/19/2016 0020    TEST NOT REPORTED DUE TO COLOR INTERFERENCE OF URINE PIGMENT   BILIRUBINUR (A) 11/19/2016 0020    TEST NOT REPORTED DUE TO COLOR INTERFERENCE OF URINE PIGMENT   KETONESUR (A) 11/19/2016 0020    TEST NOT REPORTED DUE TO COLOR INTERFERENCE OF URINE PIGMENT   PROTEINUR (A) 11/19/2016 0020    TEST NOT REPORTED DUE TO COLOR INTERFERENCE OF URINE PIGMENT   NITRITE (A) 11/19/2016 0020    TEST NOT REPORTED DUE TO COLOR INTERFERENCE OF URINE PIGMENT   LEUKOCYTESUR (A) 11/19/2016 0020    TEST NOT REPORTED DUE TO COLOR INTERFERENCE OF URINE PIGMENT   Sepsis Labs: Invalid input(s): PROCALCITONIN, LACTICIDVEN  Recent Results (from the past 240 hour(s))  Blood culture (routine x 2)     Status: None (Preliminary result)   Collection Time: 05/12/18  9:50 AM  Result Value Ref Range Status   Specimen Description BLOOD RIGHT ANTECUBITAL  Final   Special Requests   Final    BOTTLES DRAWN AEROBIC AND ANAEROBIC Blood Culture adequate volume   Culture   Final    NO GROWTH 3 DAYS Performed at [facility], [address] [place], [place] [address]    Report Status PENDING  Incomplete  Blood culture (routine x 2)     Status: Abnormal   Collection Time: 05/12/18 10:00 AM  Result Value Ref Range Status   Specimen Description BLOOD RIGHT FOREARM  Final   Special Requests   Final    BOTTLES DRAWN AEROBIC AND ANAEROBIC Blood Culture adequate volume   Culture  Setup Time   Final    ABUNDANT GRAM NEGATIVE RODS CRITICAL RESULT CALLED TO, READ BACK BY AND VERIFIED WITH: L SEAY @ 0222 ON 05/13/18 BY ROBINSON Z. WITH BCID RESULTS    Culture PROTEUS MIRABILIS (A)  Final   Report Status 05/15/2018 FINAL  Final   Organism ID, Bacteria PROTEUS MIRABILIS  Final      Susceptibility   Proteus mirabilis - MIC*    AMPICILLIN <=2 SENSITIVE Sensitive     CEFAZOLIN <=4 SENSITIVE Sensitive      CEFEPIME <=1 SENSITIVE Sensitive     CEFTAZIDIME <=1 SENSITIVE Sensitive     CEFTRIAXONE <=1 SENSITIVE  Sensitive     CIPROFLOXACIN <=0.25 SENSITIVE Sensitive     GENTAMICIN <=1 SENSITIVE Sensitive     IMIPENEM 1 SENSITIVE Sensitive     TRIMETH/SULFA <=20 SENSITIVE Sensitive     AMPICILLIN/SULBACTAM <=2 SENSITIVE Sensitive     PIP/TAZO <=4 SENSITIVE Sensitive     * PROTEUS MIRABILIS  Blood Culture ID Panel (Reflexed)     Status: Abnormal   Collection Time: 05/12/18 10:00 AM  Result Value Ref Range Status   Enterococcus species NOT DETECTED NOT DETECTED Final   Listeria monocytogenes NOT DETECTED NOT DETECTED Final   Staphylococcus species NOT DETECTED NOT DETECTED Final   Staphylococcus aureus (BCID) NOT DETECTED NOT DETECTED Final   Streptococcus species NOT DETECTED NOT DETECTED Final   Streptococcus agalactiae NOT DETECTED NOT DETECTED Final   Streptococcus pneumoniae NOT DETECTED NOT DETECTED Final   Streptococcus pyogenes NOT DETECTED NOT DETECTED Final   Acinetobacter baumannii NOT DETECTED NOT DETECTED Final   Enterobacteriaceae species DETECTED (A) NOT DETECTED Final    Comment: Enterobacteriaceae represent a large family of gram-negative bacteria, not a single organism. CRITICAL RESULT CALLED TO, READ BACK BY AND VERIFIED WITH: L SEAY @ 0222 ON 05/13/18 BY ROBINSON Z.     Enterobacter cloacae complex NOT DETECTED NOT DETECTED Final   Escherichia coli NOT DETECTED NOT DETECTED Final   Klebsiella oxytoca NOT DETECTED NOT DETECTED Final   Klebsiella pneumoniae NOT DETECTED NOT DETECTED Final   Proteus species DETECTED (A) NOT DETECTED Final    Comment: CRITICAL RESULT CALLED TO, READ BACK BY AND VERIFIED WITH: L SEAY @ 0222 ON 05/13/18 BY ROBINSON Z.    Serratia marcescens NOT DETECTED NOT DETECTED Final   Carbapenem resistance NOT DETECTED NOT DETECTED Final   Haemophilus influenzae NOT DETECTED NOT DETECTED Final   Neisseria meningitidis NOT DETECTED NOT DETECTED Final    Pseudomonas aeruginosa NOT DETECTED NOT DETECTED Final   Candida albicans NOT DETECTED NOT DETECTED Final   Candida glabrata NOT DETECTED NOT DETECTED Final   Candida krusei NOT DETECTED NOT DETECTED Final   Candida parapsilosis NOT DETECTED NOT DETECTED Final   Candida tropicalis NOT DETECTED NOT DETECTED Final  MRSA PCR Screening     Status: Abnormal   Collection Time: 05/12/18  5:01 PM  Result Value Ref Range Status   MRSA by PCR POSITIVE (A) NEGATIVE Final    Comment:        The GeneXpert MRSA Assay (FDA approved for NASAL specimens only), is one component of a comprehensive MRSA colonization surveillance program. It is not intended to diagnose MRSA infection nor to guide or monitor treatment for MRSA infections. RESULT CALLED TO, READ BACK BY AND VERIFIED WITH: Leandro Reasoner RN 05/12/18 2116 JDW Performed at [facility], [place] [address], [place], [place] [address]   Surgical pcr screen     Status: Abnormal   Collection Time: 05/14/18  9:39 AM  Result Value Ref Range Status   MRSA, PCR POSITIVE (A) NEGATIVE Final    Comment: RESULT CALLED TO, READ BACK BY AND VERIFIED WITH: A.RIA RN AT 1215 05/14/18 BY A.DAVIS    Staphylococcus aureus POSITIVE (A) NEGATIVE Final    Comment: (NOTE) The Xpert SA Assay (FDA approved for NASAL specimens in patients 51 years of age and older), is one component of a comprehensive surveillance program. It is not intended to diagnose infection nor to guide or monitor treatment. Performed at [facility], [place] [address] [place], [place] [address]  Radiology Studies: No results found.  Marzetta Board, MD, PhD [facility]  Contact via  [email or web address]  [place] P: [phone]  F: [phone]

## 2018-05-16 NOTE — Progress Notes (Addendum)
Inpatient Rehabilitation Admissions Coordinator  Inpatient acute rehab consult received. I met with patient and his spouse at bedside for rehab assessment. They live in a one level , level entry apartment, BUT, have 14 steps entry. Wife states she is requesting SNF rehab to give her time to hopefully get moved to a first floor apartment. She also plans to apply for medicaid. Patient was at [facility] SNF last year and she is requesting he return there for his rehab. I have alerted SW. We will sign off at this time.  Danne Baxter, RN, MSN Rehab Admissions Coordinator [phone] 05/16/2018 4:11 PM

## 2018-05-17 DIAGNOSIS — L03115 Cellulitis of right lower limb: Secondary | ICD-10-CM

## 2018-05-17 DIAGNOSIS — N186 End stage renal disease: Secondary | ICD-10-CM

## 2018-05-17 DIAGNOSIS — Z992 Dependence on renal dialysis: Secondary | ICD-10-CM

## 2018-05-17 DIAGNOSIS — S88111A Complete traumatic amputation at level between knee and ankle, right lower leg, initial encounter: Secondary | ICD-10-CM

## 2018-05-17 DIAGNOSIS — J189 Pneumonia, unspecified organism: Secondary | ICD-10-CM

## 2018-05-17 DIAGNOSIS — I10 Essential (primary) hypertension: Secondary | ICD-10-CM

## 2018-05-17 LAB — BASIC METABOLIC PANEL
Anion gap: 14 (ref 5–15)
BUN: 51 mg/dL — ABNORMAL HIGH (ref 6–20)
CO2: 24 mmol/L (ref 22–32)
Calcium: 10 mg/dL (ref 8.9–10.3)
Chloride: 92 mmol/L — ABNORMAL LOW (ref 98–111)
Creatinine, Ser: 10.16 mg/dL — ABNORMAL HIGH (ref 0.61–1.24)
GFR calc Af Amer: 6 mL/min — ABNORMAL LOW (ref >60)
GFR calc non Af Amer: 5 mL/min — ABNORMAL LOW (ref >60)
Glucose, Bld: 174 mg/dL — ABNORMAL HIGH (ref 70–99)
Potassium: 4.3 mmol/L (ref 3.5–5.1)
Sodium: 130 mmol/L — ABNORMAL LOW (ref 135–145)

## 2018-05-17 LAB — CULTURE, BLOOD (ROUTINE X 2)
Culture: NO GROWTH
Special Requests: ADEQUATE

## 2018-05-17 LAB — CBC
HCT: 25.5 % — ABNORMAL LOW (ref 39.0–52.0)
Hemoglobin: 7.9 g/dL — ABNORMAL LOW (ref 13.0–17.0)
MCH: 28.7 pg (ref 26.0–34.0)
MCHC: 31 g/dL (ref 30.0–36.0)
MCV: 92.7 fL (ref 80.0–100.0)
Platelets: 252 10*3/uL (ref 150–400)
RBC: 2.75 MIL/uL — ABNORMAL LOW (ref 4.22–5.81)
RDW: 14.4 % (ref 11.5–15.5)
WBC: 13.5 10*3/uL — ABNORMAL HIGH (ref 4.0–10.5)
nRBC: 0 % (ref 0.0–0.2)

## 2018-05-17 LAB — GLUCOSE, CAPILLARY
Glucose-Capillary: 135 mg/dL — ABNORMAL HIGH (ref 70–99)
Glucose-Capillary: 163 mg/dL — ABNORMAL HIGH (ref 70–99)
Glucose-Capillary: 165 mg/dL — ABNORMAL HIGH (ref 70–99)

## 2018-05-17 MED ORDER — DARBEPOETIN ALFA 200 MCG/0.4ML IJ SOSY
200.0000 ug | PREFILLED_SYRINGE | INTRAMUSCULAR | Status: DC
  Administered 2018-05-17: 200 ug via INTRAVENOUS

## 2018-05-17 MED ORDER — DARBEPOETIN ALFA 200 MCG/0.4ML IJ SOSY
PREFILLED_SYRINGE | INTRAMUSCULAR | Status: AC
  Administered 2018-05-17: 200 ug via INTRAVENOUS
  Filled 2018-05-17: qty 0.4

## 2018-05-17 NOTE — NC FL2 (Addendum)
[place] MEDICAID FL2 LEVEL OF CARE SCREENING TOOL     IDENTIFICATION  Patient Name: Howard Bell Birthdate: 05-Nov-1965 Sex: male Admission Date (Current Location): 05/12/2018  [facility] and [place] Number:  Herbalist and Address:  [facility], [place] [address], [place], [place] [address]      Provider Number: [id number]  Attending Physician Name and Address:  Charlynne Cousins, MD  Relative Name and Phone Number:  Kyri Dai - wife - [phone]    Current Level of Care: Hospital Recommended Level of Care: [place] Prior Approval Number:    Date Approved/Denied:   PASRR Number: [id number]  Discharge Plan: SNF    Current Diagnoses: Patient Active Problem List   Diagnosis Date Noted  . Cellulitis of right lower extremity   . Influenza A 05/12/2018  . Pneumonia 05/12/2018  . Gangrene of right foot ([place])   . Critical limb ischemia with history of revascularization of same extremity 05/09/2018  . LUQ pain   . 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 ([place]) 11/16/2017  . CKD stage 5 due to type 2 diabetes mellitus ([place]) 11/16/2017  . 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 Chronic generalized pain 11/16/2017  . Recurrent pneumonia 11/06/2017  . Diabetic gastroparesis ([place]) 11/06/2017  . Generalized abdominal pain 09/13/2017  . Acute pain of right shoulder 07/26/2017  . Acute bursitis of right shoulder 07/26/2017  . Left arm swelling 03/28/2017  . ESRD on dialysis ([place]) 03/28/2017  . Chest pain 11/26/2016  . Essential hypertension 11/26/2016  . Anemia due to end stage renal disease ([place]) 08/04/2016  . Acute respiratory failure with hypoxemia ([place]) 08/04/2016  . Type 2 diabetes mellitus with foot ulcer ([place]) 08/04/2016  . Acute CHF (congestive heart  failure) ([place]) 08/04/2016  . Elevated troponin     Orientation RESPIRATION BLADDER Height & Weight     Self, Time, Situation, Place  Other (Comment)(CPAP (full face mask-large) at bedtime; EPAP-7cm H2O, Flow rate 2 lpm) Continent Weight: 201 lb 11.5 oz (91.5 kg) Height:  6' (182.9 cm)  BEHAVIORAL SYMPTOMS/MOOD NEUROLOGICAL BOWEL NUTRITION STATUS      Continent Diet(Renal with 1200 mL fluid restriction)  AMBULATORY STATUS COMMUNICATION OF NEEDS Skin   Total Care(Patient was unable to ambulate with PT on 05/16/18.  Patient had right BKA on 2/23) Verbally Other (Comment), Surgical wounds(Venous stasis ulcer left lower posterior leg; Venous stasis ulcer right anterior foot-necrotic; Incision right leg 2/23)                       Personal Care Assistance Level of Assistance  Bathing, Feeding, Dressing Bathing Assistance: Maximum assistance Feeding assistance: Independent(Modified Independent per OT) Dressing Assistance: Maximum assistance     Functional Limitations Info  Sight, Hearing, Speech Sight Info: Adequate Hearing Info: Adequate Speech Info: Adequate    SPECIAL CARE FACTORS FREQUENCY  PT (By licensed PT), OT (By licensed OT)     PT Frequency: Evaluation 2/25 - PT at SNF, EVal and Treat OT Frequency: Evaluation 2/25 - OT at SNF, Eval and Treat            Contractures Contractures Info: Not present    Additional Factors Info  Code Status, Insulin Sliding Scale Code Status Info: Full     Insulin Sliding Scale Info: 0-5 Units daily at  bedtime; 0-9 Units 3 times per day with meals       Current Medications (05/17/2018):  This is the current hospital active medication list Current Facility-Administered Medications  Medication Dose Route Frequency Provider Last Rate Last Dose  . 0.9 %  sodium chloride infusion  100 mL Intravenous PRN Newt Minion, MD      . 0.9 %  sodium chloride infusion  100 mL Intravenous PRN Newt Minion, MD      . 0.9 %  sodium chloride  infusion   Intravenous Continuous Newt Minion, MD      . acetaminophen (TYLENOL) tablet 325-650 mg  325-650 mg Oral Q6H PRN Newt Minion, MD   650 mg at 05/15/18 1633  . albuterol (PROVENTIL) (2.5 MG/3ML) 0.083% nebulizer solution 3 mL  3 mL Inhalation Q8H PRN Newt Minion, MD      . amLODipine (NORVASC) tablet 5 mg  5 mg Oral Nile Riggs, MD   5 mg at 05/16/18 2241  . azithromycin (ZITHROMAX) 500 mg in sodium chloride 0.9 % 250 mL IVPB  500 mg Intravenous Q24H Caren Griffins, MD   Stopped at 05/15/18 1211  . bisacodyl (DULCOLAX) suppository 10 mg  10 mg Rectal Daily PRN Newt Minion, MD      . brimonidine (ALPHAGAN) 0.2 % ophthalmic solution 1 drop  1 drop Both Eyes BID Newt Minion, MD   1 drop at 05/16/18 2242  . calcium acetate (PHOSLO) capsule 2,668 mg  2,668 mg Oral TID WC Newt Minion, MD   2,668 mg at 05/16/18 1801  . cefTRIAXone (ROCEPHIN) 2 g in sodium chloride 0.9 % 100 mL IVPB  2 g Intravenous Q24H Caren Griffins, MD 200 mL/hr at 05/16/18 1141 2 g at 05/16/18 1141  . Chlorhexidine Gluconate Cloth 2 % PADS 6 each  6 each Topical Q0600 Caren Griffins, MD   6 each at 05/16/18 [phone]  . Chlorhexidine Gluconate Cloth 2 % PADS 6 each  6 each Topical Q0600 Janalee Dane, PA-C   6 each at 05/17/18 [phone]  . Darbepoetin Alfa (ARANESP) injection 200 mcg  200 mcg Intravenous Q Wed-HD Mauricia Area, MD   200 mcg at 05/17/18 1154  . docusate sodium (COLACE) capsule 100 mg  100 mg Oral BID Newt Minion, MD   100 mg at 05/16/18 2241  . feeding supplement (ENSURE ENLIVE) (ENSURE ENLIVE) liquid 237 mL  237 mL Oral TID BM Newt Minion, MD   237 mL at 05/15/18 2157  . feeding supplement (PRO-STAT SUGAR FREE 64) liquid 30 mL  30 mL Oral BID Newt Minion, MD   30 mL at 05/16/18 2242  . guaiFENesin-dextromethorphan (ROBITUSSIN DM) 100-10 MG/5ML syrup 5 mL  5 mL Oral Q4H PRN Newt Minion, MD      . heparin injection 5,000 Units  5,000 Units Subcutaneous Q8H Newt Minion, MD   5,000 Units at 05/17/18 8466  . heparin injection 9,000 Units  9,000 Units Dialysis PRN Newt Minion, MD   9,000 Units at 05/13/18 1122  . HYDROmorphone (DILAUDID) injection 0.5-1 mg  0.5-1 mg Intravenous Q4H PRN Newt Minion, MD   1 mg at 05/16/18 0200  . insulin aspart (novoLOG) injection 0-5 Units  0-5 Units Subcutaneous QHS Newt Minion, MD      . insulin aspart (novoLOG) injection 0-9 Units  0-9 Units Subcutaneous TID WC Newt Minion, MD   2 Units  at 05/16/18 1803  . lidocaine (PF) (XYLOCAINE) 1 % injection 5 mL  5 mL Intradermal PRN Newt Minion, MD      . lidocaine-prilocaine (EMLA) cream 1 application  1 application Topical PRN Newt Minion, MD      . methocarbamol (ROBAXIN) tablet 500 mg  500 mg Oral Q6H PRN Newt Minion, MD   500 mg at 05/14/18 2145   Or  . methocarbamol (ROBAXIN) 500 mg in dextrose 5 % 50 mL IVPB  500 mg Intravenous Q6H PRN Newt Minion, MD      . metoCLOPramide (REGLAN) tablet 5 mg  5 mg Oral TID AC Newt Minion, MD   5 mg at 05/16/18 1801  . metoprolol succinate (TOPROL-XL) 24 hr tablet 50 mg  50 mg Oral Nile Riggs, MD   50 mg at 05/16/18 2241  . multivitamin (RENA-VIT) tablet 1 tablet  1 tablet Oral QHS Newt Minion, MD   1 tablet at 05/16/18 2242  . mupirocin ointment (BACTROBAN) 2 % 1 application  1 application Nasal BID Caren Griffins, MD   1 application at 93/23/55 2242  . ondansetron (ZOFRAN) tablet 4 mg  4 mg Oral Q6H PRN Newt Minion, MD       Or  . ondansetron [facility]) injection 4 mg  4 mg Intravenous Q6H PRN Newt Minion, MD      . oxyCODONE (Oxy IR/ROXICODONE) immediate release tablet 10-15 mg  10-15 mg Oral Q4H PRN Newt Minion, MD      . oxyCODONE (Oxy IR/ROXICODONE) immediate release tablet 5-10 mg  5-10 mg Oral Q4H PRN Newt Minion, MD   10 mg at 05/15/18 2155  . pantoprazole (PROTONIX) EC tablet 40 mg  40 mg Oral Daily Newt Minion, MD   40 mg at 05/16/18 1139  . pentafluoroprop-tetrafluoroeth  (GEBAUERS) aerosol 1 application  1 application Topical PRN Newt Minion, MD      . polyethylene glycol (MIRALAX / GLYCOLAX) packet 17 g  17 g Oral Daily PRN Newt Minion, MD         Discharge Medications: Please see discharge summary for a list of discharge medications.  Relevant Imaging Results:  Relevant Lab Results:   Additional Information Right BKA  05/14/18.  Wound Vac on amputation incision site - 125 mm/Hg continuous suction.  Dialysis patient MWF [facility].   *MRSA PCR positive   Sharlet Salina Mila Homer, LCSW

## 2018-05-17 NOTE — Care Management Important Message (Signed)
Important Message  Patient Details  Name: Howard Bell MRN: [id number] Date of Birth: Jul 30, 1965   Medicare Important Message Given:  Yes    Carles Collet, RN 05/17/2018, 3:17 PM

## 2018-05-17 NOTE — Clinical Social Work Note (Addendum)
Clinical Social Work Assessment  Patient Details  Name: Howard Bell MRN: [id number] Date of Birth: Apr 25, 1965  Date of referral:  05/17/18               Reason for consult:  Discharge Planning, Facility Placement                Permission sought to share information with:  Family Supports Permission granted to share information::  Yes, Verbal Permission Granted  Name::     Howard Bell  Agency::     Relationship::  Wife  Contact Information:  [phone]  Housing/Transportation Living arrangements for the past 2 months:  Apartment Source of Information:  Patient, Other (Comment Required)(Note from [place] staff person) Patient Interpreter Needed:  None Criminal Activity/Legal Involvement Pertinent to Current Situation/Hospitalization:  No - Comment as needed Significant Relationships:  Spouse Lives with:  Spouse Do you feel safe going back to the place where you live?  No Need for family participation in patient care:  Yes (Comment)  Care giving concerns: Patient expressed agreement with going to rehab once discharged.   Social Worker assessment / plan:  CSW talked with patient at the bedside regarding his discharge plan and facility preference. Howard Bell was sitting up in bed and responded with short, gruff responses. When asked about going to rehab, patient responded that this is where he is supposed to be going as far as he knows. He did give CSW permission to contact his wife and call made and message left.  3:15 pm: Visited with patient again and talked with wife by phone while in room with Howard Bell. Wife does want [facility] and gave CSW permission to send patient's clinicals to facility. CSW informed by wife that she is going to DSS to apply for regular Medicaid for her husband as she needs helop with paying for his medical bills. Mrs. Mota also expressed concern regarding her husband being able to safely navigate the steps he has to climb to get to  their apartment, and CSW expressed understanding.    Employment status:  Disabled (Comment on whether or not currently receiving Disability) Insurance information:  Medicare PT Recommendations:  [place] / Referral to community resources:  Other (Comment Required)(SNF list not provided to patient as he expressed a facility preference)  Patient/Family's Response to care:  Howard Bell nor his wife expressed any concerns regarding his care during hospitalization.  Patient/Family's Understanding of and Emotional Response to Diagnosis, Current Treatment, and Prognosis:  Wife expressed understanding that her husband does need rehab to get stronger and be safer at home and climbing the steps to get to their apartment.  Emotional Assessment Appearance:  Appears older than stated age Attitude/Demeanor/Rapport:  Other(Appropriate) Affect (typically observed):  Flat, Other(Gruff) Orientation:  Oriented to Self, Oriented to Place, Oriented to  Time, Oriented to Situation Alcohol / Substance use:  Tobacco Use, Alcohol Use, Illicit Drugs(Patient reported that he has never smoked and does not drink or use illicit drugs) Psych involvement (Current and /or in the community):  No (Comment)  Discharge Needs  Concerns to be addressed:  Discharge Planning Concerns Readmission within the last 30 days:  No Current discharge risk:  None Barriers to Discharge:  No SNF bed(Contact made with facility preference)   Sable Feil, [place] 05/17/2018, 2:52 PM

## 2018-05-17 NOTE — Procedures (Signed)
I was present at this session.  I have reviewed the session itself and made appropriate changes.  HD via R UA avf.  bp tol Hd, access press ok   Mauricia Area 2/26/20208:42 AM

## 2018-05-17 NOTE — Progress Notes (Signed)
Subjective: Interval History: has complaints dressing tight on leg.  Objective: Vital signs in last 24 hours: Temp:  [98 F (36.7 C)-99.7 F (37.6 C)] 98.6 F (37 C) (02/26 0541) Pulse Rate:  [81-99] 81 (02/26 0541) Resp:  [16-21] 20 (02/26 0541) BP: (133-163)/(74-75) 163/74 (02/26 0541) SpO2:  [97 %-100 %] 99 % (02/26 0541) Weight:  [98.1 kg] 98.1 kg (02/25 2034) Weight change: 1.484 kg  Intake/Output from previous day: 02/25 0701 - 02/26 0700 In: 460 [P.O.:460] Out: 0  Intake/Output this shift: No intake/output data recorded.  General appearance: alert, cooperative and moderately obese Resp: diminished breath sounds bilaterally Cardio: S1, S2 normal and systolic murmur: systolic ejection 2/6, crescendo and decrescendo at 2nd left intercostal space GI: obese, pos bs, mild distension Extremities: LUA AVF, R BKA  Lab Results: Recent Labs    05/16/18 0523 05/17/18 0345  WBC 12.9* 13.5*  HGB 8.5* 7.9*  HCT 27.8* 25.5*  PLT 245 252   BMET:  Recent Labs    05/16/18 0523 05/17/18 0345  NA 132* 130*  K 4.1 4.3  CL 91* 92*  CO2 27 24  GLUCOSE 165* 174*  BUN 33* 51*  CREATININE 7.83* 10.16*  CALCIUM 9.5 10.0   No results for input(s): PTH in the last 72 hours. Iron Studies: No results for input(s): IRON, TIBC, TRANSFERRIN, FERRITIN in the last 72 hours.  Studies/Results: No results found.  I have reviewed the patient's current medications.  Assessment/Plan: 1 ESRD for HD 2 Anemia esa 3 HPTH vit D  4 Obesity 5 DM controlle 6 PVD post amp 7 Proteus sepsis  Treated, source foot 8? Pneu P HD, esa, PT,    LOS: 5 days   Jeneen Rinks Oaklyn Jakubek 05/17/2018,8:43 AM

## 2018-05-17 NOTE — Progress Notes (Signed)
[facility] PROGRESS NOTE    Progress Note  Howard Bell  EPP:[id number] DOB: Nov 30, 1965 DOA: 05/12/2018 PCP: Horald Pollen, MD     Brief Narrative:   Howard Bell is an 53 y.o. male past medical history of diabetes mellitus type 2 on insulin end-stage renal disease Monday Wednesday and Friday, gastroparesis recently diagnosed with the flu presents with a febrile illness, in the ED his white count was 15 chest x-ray showed probable right lower lobe pneumonia  Assessment/Plan:   Sepsis due to Proteus bacteremia in the setting of right lower extremity wound ulcer: Started empirically on IV antibiotics will continue on IV Rocephin orthopedic surgery was consulted. 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 His wound VAC is in place. Social worker has been consulted for possible skilled nursing facility evaluation. Physical therapy evaluation commended CIR. Was not approved by CIR awaiting skilled nursing facility placement.  Possible right lower lobe pneumonia: Started empirically on IV Rocephin and azithromycin. He is currently on IV Rocephin and is improved.  End-stage t renal disease on hemodialysis: Hemodialysis per renal.  Essential hypertension: Continue current home medication.  History of influenza A: Completed his Tamiflu treatment.   DVT prophylaxis: lovenox Family Communication:none Disposition Plan/Barrier to D/C: SNF when bed available Code Status:     Code Status Orders  (From admission, onward)         Start     Ordered   05/12/18 1604  Full code  Continuous     05/12/18 1603        Code Status History    Date Active Date Inactive Code Status Order ID Comments User Context   11/06/2017 2254 11/11/2017 2109 Full Code [id number]  Howard Folks, MD Inpatient   03/28/2017 0219 03/30/2017 2106 Full Code [id number]  Howard Bulls, MD ED   11/26/2016 2128 11/27/2016 1946 Full Code [id number]  Howard Patience, MD Inpatient   08/04/2016 1220  08/06/2016 1817 Full Code [id number]  Howard Dickens, MD ED        IV Access:    Peripheral IV   Procedures and diagnostic studies:   No results found.   Medical Consultants:    None.  Anti-Infectives:   IV Rocephin.  Subjective:    Howard Bell relates his pain is controlled.  Objective:    Vitals:   05/17/18 1030 05/17/18 1100 05/17/18 1130 05/17/18 1147  BP: 131/62 111/60 120/62 119/61  Pulse: 82 86 83 86  Resp:    18  Temp:    98.2 F (36.8 C)  TempSrc:    Oral  SpO2:      Weight:    91.5 kg  Height:        Intake/Output Summary (Last 24 hours) at 05/17/2018 1245 Last data filed at 05/17/2018 1147 Gross per 24 hour  Intake 360 ml  Output 1732 ml  Net -1372 ml   Filed Weights   05/16/18 2034 05/17/18 0740 05/17/18 1147  Weight: 98.1 kg 93 kg 91.5 kg    Exam: General exam: In no acute distress. Respiratory system: Good air movement and clear to auscultation. Cardiovascular system: S1 & S2 heard, RRR. Gastrointestinal system: Abdomen is nondistended, soft and nontender.  Central nervous system: Alert and oriented. No focal neurological deficits. Extremities: No pedal edema. Skin: Below the knee amputation on the right wound VAC in place. Psychiatry: Judgement and insight appear normal. Mood & affect appropriate.    Data Reviewed:    Labs: Basic Metabolic Panel: Recent Labs  Lab 05/12/18 0950 05/13/18 1128 05/14/18 1520 05/15/18 0922 05/16/18 0523 05/17/18 0345  NA 133* 132* 133* 133* 132* 130*  K 4.9 5.0 4.5 3.5 4.1 4.3  CL 90* 88*  --  93* 91* 92*  CO2 26 25  --  28 27 24   GLUCOSE 130* 203*  --  135* 165* 174*  BUN 45* 62*  --  28* 33* 51*  CREATININE 11.91* 13.79*  --  6.46* 7.83* 10.16*  CALCIUM 8.1* 8.2*  --  8.5* 9.5 10.0  PHOS  --  8.8*  --  4.2  --   --    GFR Estimated Creatinine Clearance: 9.5 mL/min (A) (by C-G formula based on SCr of 10.16 mg/dL (H)). Liver Function Tests: Recent Labs  Lab 05/12/18 0950  05/13/18 1128 05/15/18 0922  AST 16  --   --   ALT 11  --   --   ALKPHOS 83  --   --   BILITOT 1.0  --   --   PROT 8.2*  --   --   ALBUMIN 2.7* 2.3* 2.5*   No results for input(s): LIPASE, AMYLASE in the last 168 hours. No results for input(s): AMMONIA in the last 168 hours. Coagulation profile No results for input(s): INR, PROTIME in the last 168 hours.  CBC: Recent Labs  Lab 05/12/18 0950 05/13/18 1128 05/14/18 1520 05/15/18 0921 05/16/18 0523 05/17/18 0345  WBC 15.4* 18.9*  --  12.6* 12.9* 13.5*  NEUTROABS 12.1*  --   --   --   --   --   HGB 10.2* 10.0* 8.8* 9.0* 8.5* 7.9*  HCT 32.2* 31.0* 26.0* 29.3* 27.8* 25.5*  MCV 93.6 92.3  --  93.6 95.9 92.7  PLT 280 247  --  246 245 252   Cardiac Enzymes: No results for input(s): CKTOTAL, CKMB, CKMBINDEX, TROPONINI in the last 168 hours. BNP (last 3 results) No results for input(s): PROBNP in the last 8760 hours. CBG: Recent Labs  Lab 05/16/18 0722 05/16/18 1128 05/16/18 1702 05/16/18 2036 05/17/18 1224  GLUCAP 155* 147* 157* 152* 135*   D-Dimer: No results for input(s): DDIMER in the last 72 hours. Hgb A1c: No results for input(s): HGBA1C in the last 72 hours. Lipid Profile: No results for input(s): CHOL, HDL, LDLCALC, TRIG, CHOLHDL, LDLDIRECT in the last 72 hours. Thyroid function studies: No results for input(s): TSH, T4TOTAL, T3FREE, THYROIDAB in the last 72 hours.  Invalid input(s): FREET3 Anemia work up: No results for input(s): VITAMINB12, FOLATE, FERRITIN, TIBC, IRON, RETICCTPCT in the last 72 hours. Sepsis Labs: Recent Labs  Lab 05/12/18 1001 05/12/18 1629 05/13/18 1128 05/15/18 0921 05/16/18 0523 05/17/18 0345  WBC  --   --  18.9* 12.6* 12.9* 13.5*  LATICACIDVEN 1.4 1.5  --   --   --   --    Microbiology Recent Results (from the past 240 hour(s))  Blood culture (routine x 2)     Status: None   Collection Time: 05/12/18  9:50 AM  Result Value Ref Range Status   Specimen Description BLOOD  RIGHT ANTECUBITAL  Final   Special Requests   Final    BOTTLES DRAWN AEROBIC AND ANAEROBIC Blood Culture adequate volume   Culture   Final    NO GROWTH 5 DAYS Performed at [facility], [place] [address], [place], [place] [address]    Report Status 05/17/2018 FINAL  Final  Blood culture (routine x 2)     Status: Abnormal   Collection  Time: 05/12/18 10:00 AM  Result Value Ref Range Status   Specimen Description BLOOD RIGHT FOREARM  Final   Special Requests   Final    BOTTLES DRAWN AEROBIC AND ANAEROBIC Blood Culture adequate volume   Culture  Setup Time   Final    ABUNDANT GRAM NEGATIVE RODS CRITICAL RESULT CALLED TO, READ BACK BY AND VERIFIED WITH: L SEAY @ 0222 ON 05/13/18 BY ROBINSON Z. WITH BCID RESULTS    Culture PROTEUS MIRABILIS (A)  Final   Report Status 05/15/2018 FINAL  Final   Organism ID, Bacteria PROTEUS MIRABILIS  Final      Susceptibility   Proteus mirabilis - MIC*    AMPICILLIN <=2 SENSITIVE Sensitive     CEFAZOLIN <=4 SENSITIVE Sensitive     CEFEPIME <=1 SENSITIVE Sensitive     CEFTAZIDIME <=1 SENSITIVE Sensitive     CEFTRIAXONE <=1 SENSITIVE Sensitive     CIPROFLOXACIN <=0.25 SENSITIVE Sensitive     GENTAMICIN <=1 SENSITIVE Sensitive     IMIPENEM 1 SENSITIVE Sensitive     TRIMETH/SULFA <=20 SENSITIVE Sensitive     AMPICILLIN/SULBACTAM <=2 SENSITIVE Sensitive     PIP/TAZO <=4 SENSITIVE Sensitive     * PROTEUS MIRABILIS  Blood Culture ID Panel (Reflexed)     Status: Abnormal   Collection Time: 05/12/18 10:00 AM  Result Value Ref Range Status   Enterococcus species NOT DETECTED NOT DETECTED Final   Listeria monocytogenes NOT DETECTED NOT DETECTED Final   Staphylococcus species NOT DETECTED NOT DETECTED Final   Staphylococcus aureus (BCID) NOT DETECTED NOT DETECTED Final   Streptococcus species NOT DETECTED NOT DETECTED Final   Streptococcus agalactiae NOT DETECTED NOT DETECTED Final   Streptococcus pneumoniae NOT DETECTED NOT DETECTED Final   Streptococcus  pyogenes NOT DETECTED NOT DETECTED Final   Acinetobacter baumannii NOT DETECTED NOT DETECTED Final   Enterobacteriaceae species DETECTED (A) NOT DETECTED Final    Comment: Enterobacteriaceae represent a large family of gram-negative bacteria, not a single organism. CRITICAL RESULT CALLED TO, READ BACK BY AND VERIFIED WITH: L SEAY @ 0222 ON 05/13/18 BY ROBINSON Z.     Enterobacter cloacae complex NOT DETECTED NOT DETECTED Final   Escherichia coli NOT DETECTED NOT DETECTED Final   Klebsiella oxytoca NOT DETECTED NOT DETECTED Final   Klebsiella pneumoniae NOT DETECTED NOT DETECTED Final   Proteus species DETECTED (A) NOT DETECTED Final    Comment: CRITICAL RESULT CALLED TO, READ BACK BY AND VERIFIED WITH: L SEAY @ 0222 ON 05/13/18 BY ROBINSON Z.    Serratia marcescens NOT DETECTED NOT DETECTED Final   Carbapenem resistance NOT DETECTED NOT DETECTED Final   Haemophilus influenzae NOT DETECTED NOT DETECTED Final   Neisseria meningitidis NOT DETECTED NOT DETECTED Final   Pseudomonas aeruginosa NOT DETECTED NOT DETECTED Final   Candida albicans NOT DETECTED NOT DETECTED Final   Candida glabrata NOT DETECTED NOT DETECTED Final   Candida krusei NOT DETECTED NOT DETECTED Final   Candida parapsilosis NOT DETECTED NOT DETECTED Final   Candida tropicalis NOT DETECTED NOT DETECTED Final  MRSA PCR Screening     Status: Abnormal   Collection Time: 05/12/18  5:01 PM  Result Value Ref Range Status   MRSA by PCR POSITIVE (A) NEGATIVE Final    Comment:        The GeneXpert MRSA Assay (FDA approved for NASAL specimens only), is one component of a comprehensive MRSA colonization surveillance program. It is not intended to diagnose MRSA infection nor to guide or monitor treatment  for MRSA infections. RESULT CALLED TO, READ BACK BY AND VERIFIED WITH: Leandro Reasoner RN 05/12/18 2116 JDW Performed at [facility], [place] [address] [place], [place] [address]   Surgical pcr screen     Status: Abnormal    Collection Time: 05/14/18  9:39 AM  Result Value Ref Range Status   MRSA, PCR POSITIVE (A) NEGATIVE Final    Comment: RESULT CALLED TO, READ BACK BY AND VERIFIED WITH: A.RIA RN AT 1215 05/14/18 BY A.DAVIS    Staphylococcus aureus POSITIVE (A) NEGATIVE Final    Comment: (NOTE) The Xpert SA Assay (FDA approved for NASAL specimens in patients 75 years of age and older), is one component of a comprehensive surveillance program. It is not intended to diagnose infection nor to guide or monitor treatment. Performed at [facility], [place] [address], [place], [place] [address]      Medications:   . amLODipine  5 mg Oral QHS  . brimonidine  1 drop Both Eyes BID  . calcium acetate  2,668 mg Oral TID WC  . Chlorhexidine Gluconate Cloth  6 each Topical Q0600  . Chlorhexidine Gluconate Cloth  6 each Topical Q0600  . darbepoetin (ARANESP) injection - DIALYSIS  200 mcg Intravenous Q Wed-HD  . docusate sodium  100 mg Oral BID  . feeding supplement (ENSURE ENLIVE)  237 mL Oral TID BM  . feeding supplement (PRO-STAT SUGAR FREE 64)  30 mL Oral BID  . heparin  5,000 Units Subcutaneous Q8H  . insulin aspart  0-5 Units Subcutaneous QHS  . insulin aspart  0-9 Units Subcutaneous TID WC  . metoCLOPramide  5 mg Oral TID AC  . metoprolol succinate  50 mg Oral QHS  . multivitamin  1 tablet Oral QHS  . mupirocin ointment  1 application Nasal BID  . pantoprazole  40 mg Oral Daily   Continuous Infusions: . sodium chloride    . sodium chloride    . sodium chloride    . azithromycin Stopped (05/15/18 1211)  . cefTRIAXone (ROCEPHIN)  IV 2 g (05/16/18 1141)  . methocarbamol (ROBAXIN) IV       LOS: 5 days   Charlynne Cousins  [facility]  05/17/2018, 12:45 PM

## 2018-05-17 NOTE — Progress Notes (Signed)
Pt declined use of CPAP for the night.  Machine at bedside.  Pt prefers nasal cannula for the night. Encouraged pt to call if he changes his mind and wants to wear the CPAP.

## 2018-05-17 NOTE — Clinical Social Work Note (Signed)
CSW talked with wife and Howard Bell is her facility choice. [facility] can accept patient. Talked with MD and patient will be discharge to SNF tomorrow (2/27). Howard Bell and [name], admissions liaison with [name] updated.  CSW will continue to follow and facilitate discharge to [name] tomorrow.  [name], MSW, LCSW Licensed Clinical Social Worker [place] [phone]

## 2018-05-18 DIAGNOSIS — L97919 Non-pressure chronic ulcer of unspecified part of right lower leg with unspecified severity: Secondary | ICD-10-CM | POA: Diagnosis not present

## 2018-05-18 DIAGNOSIS — E11621 Type 2 diabetes mellitus with foot ulcer: Secondary | ICD-10-CM | POA: Diagnosis not present

## 2018-05-18 DIAGNOSIS — J101 Influenza due to other identified influenza virus with other respiratory manifestations: Secondary | ICD-10-CM | POA: Diagnosis not present

## 2018-05-18 DIAGNOSIS — D509 Iron deficiency anemia, unspecified: Secondary | ICD-10-CM | POA: Diagnosis not present

## 2018-05-18 DIAGNOSIS — I132 Hypertensive heart and chronic kidney disease with heart failure and with stage 5 chronic kidney disease, or end stage renal disease: Secondary | ICD-10-CM | POA: Diagnosis not present

## 2018-05-18 DIAGNOSIS — Z79899 Other long term (current) drug therapy: Secondary | ICD-10-CM | POA: Diagnosis not present

## 2018-05-18 DIAGNOSIS — M6281 Muscle weakness (generalized): Secondary | ICD-10-CM | POA: Diagnosis not present

## 2018-05-18 DIAGNOSIS — G546 Phantom limb syndrome with pain: Secondary | ICD-10-CM | POA: Diagnosis present

## 2018-05-18 DIAGNOSIS — Z7409 Other reduced mobility: Secondary | ICD-10-CM | POA: Diagnosis not present

## 2018-05-18 DIAGNOSIS — J302 Other seasonal allergic rhinitis: Secondary | ICD-10-CM | POA: Diagnosis not present

## 2018-05-18 DIAGNOSIS — R0902 Hypoxemia: Secondary | ICD-10-CM | POA: Diagnosis not present

## 2018-05-18 DIAGNOSIS — E1121 Type 2 diabetes mellitus with diabetic nephropathy: Secondary | ICD-10-CM | POA: Diagnosis present

## 2018-05-18 DIAGNOSIS — R279 Unspecified lack of coordination: Secondary | ICD-10-CM | POA: Diagnosis not present

## 2018-05-18 DIAGNOSIS — T8130XA Disruption of wound, unspecified, initial encounter: Secondary | ICD-10-CM | POA: Diagnosis not present

## 2018-05-18 DIAGNOSIS — T8781 Dehiscence of amputation stump: Secondary | ICD-10-CM | POA: Diagnosis not present

## 2018-05-18 DIAGNOSIS — I96 Gangrene, not elsewhere classified: Secondary | ICD-10-CM

## 2018-05-18 DIAGNOSIS — S98911S Complete traumatic amputation of right foot, level unspecified, sequela: Secondary | ICD-10-CM | POA: Diagnosis not present

## 2018-05-18 DIAGNOSIS — Z89511 Acquired absence of right leg below knee: Secondary | ICD-10-CM | POA: Diagnosis not present

## 2018-05-18 DIAGNOSIS — I70261 Atherosclerosis of native arteries of extremities with gangrene, right leg: Secondary | ICD-10-CM | POA: Diagnosis not present

## 2018-05-18 DIAGNOSIS — Z7951 Long term (current) use of inhaled steroids: Secondary | ICD-10-CM | POA: Diagnosis not present

## 2018-05-18 DIAGNOSIS — E1152 Type 2 diabetes mellitus with diabetic peripheral angiopathy with gangrene: Secondary | ICD-10-CM | POA: Diagnosis not present

## 2018-05-18 DIAGNOSIS — I871 Compression of vein: Secondary | ICD-10-CM | POA: Diagnosis not present

## 2018-05-18 DIAGNOSIS — E118 Type 2 diabetes mellitus with unspecified complications: Secondary | ICD-10-CM | POA: Diagnosis not present

## 2018-05-18 DIAGNOSIS — Z79891 Long term (current) use of opiate analgesic: Secondary | ICD-10-CM | POA: Diagnosis not present

## 2018-05-18 DIAGNOSIS — E1143 Type 2 diabetes mellitus with diabetic autonomic (poly)neuropathy: Secondary | ICD-10-CM | POA: Diagnosis not present

## 2018-05-18 DIAGNOSIS — E1129 Type 2 diabetes mellitus with other diabetic kidney complication: Secondary | ICD-10-CM | POA: Diagnosis not present

## 2018-05-18 DIAGNOSIS — H409 Unspecified glaucoma: Secondary | ICD-10-CM | POA: Diagnosis present

## 2018-05-18 DIAGNOSIS — K3184 Gastroparesis: Secondary | ICD-10-CM | POA: Diagnosis present

## 2018-05-18 DIAGNOSIS — R2230 Localized swelling, mass and lump, unspecified upper limb: Secondary | ICD-10-CM | POA: Diagnosis not present

## 2018-05-18 DIAGNOSIS — R5381 Other malaise: Secondary | ICD-10-CM | POA: Diagnosis not present

## 2018-05-18 DIAGNOSIS — D638 Anemia in other chronic diseases classified elsewhere: Secondary | ICD-10-CM | POA: Diagnosis not present

## 2018-05-18 DIAGNOSIS — M1711 Unilateral primary osteoarthritis, right knee: Secondary | ICD-10-CM | POA: Diagnosis not present

## 2018-05-18 DIAGNOSIS — G4733 Obstructive sleep apnea (adult) (pediatric): Secondary | ICD-10-CM | POA: Diagnosis present

## 2018-05-18 DIAGNOSIS — N186 End stage renal disease: Secondary | ICD-10-CM | POA: Diagnosis not present

## 2018-05-18 DIAGNOSIS — K219 Gastro-esophageal reflux disease without esophagitis: Secondary | ICD-10-CM | POA: Diagnosis present

## 2018-05-18 DIAGNOSIS — J189 Pneumonia, unspecified organism: Secondary | ICD-10-CM | POA: Diagnosis not present

## 2018-05-18 DIAGNOSIS — E8889 Other specified metabolic disorders: Secondary | ICD-10-CM | POA: Diagnosis present

## 2018-05-18 DIAGNOSIS — I509 Heart failure, unspecified: Secondary | ICD-10-CM | POA: Diagnosis not present

## 2018-05-18 DIAGNOSIS — I5022 Chronic systolic (congestive) heart failure: Secondary | ICD-10-CM | POA: Diagnosis not present

## 2018-05-18 DIAGNOSIS — S88111A Complete traumatic amputation at level between knee and ankle, right lower leg, initial encounter: Secondary | ICD-10-CM | POA: Diagnosis not present

## 2018-05-18 DIAGNOSIS — Z1159 Encounter for screening for other viral diseases: Secondary | ICD-10-CM | POA: Diagnosis not present

## 2018-05-18 DIAGNOSIS — N2581 Secondary hyperparathyroidism of renal origin: Secondary | ICD-10-CM | POA: Diagnosis not present

## 2018-05-18 DIAGNOSIS — I10 Essential (primary) hypertension: Secondary | ICD-10-CM | POA: Diagnosis not present

## 2018-05-18 DIAGNOSIS — Z743 Need for continuous supervision: Secondary | ICD-10-CM | POA: Diagnosis not present

## 2018-05-18 DIAGNOSIS — Z8673 Personal history of transient ischemic attack (TIA), and cerebral infarction without residual deficits: Secondary | ICD-10-CM | POA: Diagnosis not present

## 2018-05-18 DIAGNOSIS — F329 Major depressive disorder, single episode, unspecified: Secondary | ICD-10-CM | POA: Diagnosis not present

## 2018-05-18 DIAGNOSIS — Z992 Dependence on renal dialysis: Secondary | ICD-10-CM | POA: Diagnosis not present

## 2018-05-18 DIAGNOSIS — L03116 Cellulitis of left lower limb: Secondary | ICD-10-CM | POA: Diagnosis not present

## 2018-05-18 DIAGNOSIS — G894 Chronic pain syndrome: Secondary | ICD-10-CM | POA: Diagnosis not present

## 2018-05-18 DIAGNOSIS — I120 Hypertensive chronic kidney disease with stage 5 chronic kidney disease or end stage renal disease: Secondary | ICD-10-CM | POA: Diagnosis not present

## 2018-05-18 DIAGNOSIS — G8918 Other acute postprocedural pain: Secondary | ICD-10-CM | POA: Diagnosis not present

## 2018-05-18 DIAGNOSIS — E1122 Type 2 diabetes mellitus with diabetic chronic kidney disease: Secondary | ICD-10-CM | POA: Diagnosis not present

## 2018-05-18 DIAGNOSIS — L03115 Cellulitis of right lower limb: Secondary | ICD-10-CM | POA: Diagnosis not present

## 2018-05-18 DIAGNOSIS — D631 Anemia in chronic kidney disease: Secondary | ICD-10-CM | POA: Diagnosis not present

## 2018-05-18 LAB — GLUCOSE, CAPILLARY
Glucose-Capillary: 265 mg/dL — ABNORMAL HIGH (ref 70–99)
Glucose-Capillary: 157 mg/dL — ABNORMAL HIGH (ref 70–99)

## 2018-05-18 MED ORDER — DOXYCYCLINE HYCLATE 100 MG PO TABS
100.0000 mg | ORAL_TABLET | Freq: Two times a day (BID) | ORAL | Status: DC
  Administered 2018-05-18: 100 mg via ORAL
  Filled 2018-05-18: qty 1

## 2018-05-18 MED ORDER — AMLODIPINE BESYLATE 5 MG PO TABS: 5.0000 mg | ORAL_TABLET | Freq: Every day | ORAL | Status: DC

## 2018-05-18 MED ORDER — CIPROFLOXACIN HCL 500 MG PO TABS: 250.0000 mg | ORAL_TABLET | Freq: Every day | ORAL | 0 refills | Status: AC

## 2018-05-18 MED ORDER — OXYCODONE HCL 10 MG PO TABS: 10.0000 mg | ORAL_TABLET | ORAL | 0 refills | Status: DC | PRN

## 2018-05-18 MED ORDER — CIPROFLOXACIN HCL 500 MG PO TABS: 500.0000 mg | ORAL_TABLET | Freq: Two times a day (BID) | ORAL | 0 refills | Status: DC

## 2018-05-18 MED ORDER — DOXYCYCLINE HYCLATE 100 MG PO TABS: 100.0000 mg | ORAL_TABLET | Freq: Two times a day (BID) | ORAL | 0 refills | Status: DC

## 2018-05-18 MED ORDER — METOPROLOL SUCCINATE ER 50 MG PO TB24: 50.0000 mg | ORAL_TABLET | Freq: Every day | ORAL | Status: DC

## 2018-05-18 MED ORDER — CIPROFLOXACIN HCL 500 MG PO TABS
500.0000 mg | ORAL_TABLET | Freq: Once | ORAL | Status: AC
  Administered 2018-05-18: 500 mg via ORAL
  Filled 2018-05-18: qty 1

## 2018-05-18 NOTE — Progress Notes (Addendum)
[facility] Progress Note   Subjective:   Patient seen in room, tired this morning but denying and CP, SOB/dypnea, abdominal pain, nausea, vomiting. Plan for discharge today. Has not been up  Objective Vitals:   05/17/18 1147 05/17/18 2207 05/18/18 0549 05/18/18 0919  BP: 119/61 121/78 (!) 146/71 (!) 153/69  Pulse: 86 93 94 87  Resp: 18 18 20 18   Temp: 98.2 F (36.8 C) 98.5 F (36.9 C) 97.6 F (36.4 C) (!) 97.4 F (36.3 C)  TempSrc: Oral Oral Oral Oral  SpO2: 98% 99% (!) 86% 93%  Weight: 91.5 kg     Height:       Physical Exam General: Well developed, fatigued male. Laying in bed in NAD Heart: RRR,Gr2/6 M,no rubs or gallops Lungs: CTA bilaterally with decreased breath sounds at bases. No wheezing, rhonchi or rales Abdomen: Obese, soft, non-tender. Normoactive bowel sounds. No palpable masses Liver down 5 cm Extremities: R transtibial amputation, wound vac in place. No peripheral edema Dialysis Access: R AVG, + thrill/bruit  Additional Objective Labs: Basic Metabolic Panel: Recent Labs  Lab 05/13/18 1128  05/15/18 0922 05/16/18 0523 05/17/18 0345  NA 132*   < > 133* 132* 130*  K 5.0   < > 3.5 4.1 4.3  CL 88*  --  93* 91* 92*  CO2 25  --  28 27 24   GLUCOSE 203*  --  135* 165* 174*  BUN 62*  --  28* 33* 51*  CREATININE 13.79*  --  6.46* 7.83* 10.16*  CALCIUM 8.2*  --  8.5* 9.5 10.0  PHOS 8.8*  --  4.2  --   --    < > = values in this interval not displayed.   Liver Function Tests: Recent Labs  Lab 05/12/18 0950 05/13/18 1128 05/15/18 0922  AST 16  --   --   ALT 11  --   --   ALKPHOS 83  --   --   BILITOT 1.0  --   --   PROT 8.2*  --   --   ALBUMIN 2.7* 2.3* 2.5*   CBC: Recent Labs  Lab 05/12/18 0950 05/13/18 1128  05/15/18 0921 05/16/18 0523 05/17/18 0345  WBC 15.4* 18.9*  --  12.6* 12.9* 13.5*  NEUTROABS 12.1*  --   --   --   --   --   HGB 10.2* 10.0*   < > 9.0* 8.5* 7.9*  HCT 32.2* 31.0*   < > 29.3* 27.8* 25.5*  MCV 93.6 92.3   --  93.6 95.9 92.7  PLT 280 247  --  246 245 252   < > = values in this interval not displayed.   Blood Culture    Component Value Date/Time   SDES BLOOD RIGHT FOREARM 05/12/2018 1000   SPECREQUEST  05/12/2018 1000    BOTTLES DRAWN AEROBIC AND ANAEROBIC Blood Culture adequate volume   CULT PROTEUS MIRABILIS (A) 05/12/2018 1000   REPTSTATUS 05/15/2018 FINAL 05/12/2018 1000   CBG: Recent Labs  Lab 05/16/18 2036 05/17/18 1224 05/17/18 1618 05/17/18 2203 05/18/18 0751  GLUCAP 152* 135* 163* 165* 265*   Medications: . sodium chloride    . sodium chloride    . sodium chloride 10 mL/hr at 05/17/18 1303  . azithromycin 500 mg (05/17/18 1304)  . cefTRIAXone (ROCEPHIN)  IV 2 g (05/17/18 1435)  . methocarbamol (ROBAXIN) IV     . amLODipine  5 mg Oral QHS  . brimonidine  1 drop Both Eyes BID  .  calcium acetate  2,668 mg Oral TID WC  . Chlorhexidine Gluconate Cloth  6 each Topical Q0600  . Chlorhexidine Gluconate Cloth  6 each Topical Q0600  . darbepoetin (ARANESP) injection - DIALYSIS  200 mcg Intravenous Q Wed-HD  . docusate sodium  100 mg Oral BID  . feeding supplement (ENSURE ENLIVE)  237 mL Oral TID BM  . feeding supplement (PRO-STAT SUGAR FREE 64)  30 mL Oral BID  . heparin  5,000 Units Subcutaneous Q8H  . insulin aspart  0-5 Units Subcutaneous QHS  . insulin aspart  0-9 Units Subcutaneous TID WC  . metoCLOPramide  5 mg Oral TID AC  . metoprolol succinate  50 mg Oral QHS  . multivitamin  1 tablet Oral QHS  . mupirocin ointment  1 application Nasal BID  . pantoprazole  40 mg Oral Daily    Dialysis Orders: [facility] MWF 4 hr 15 min 180 NRe 450/autoflow 2.0 97.5 kg 2.0 K/2.25 Ca UFP 2  R AVG -Heparin 9000 units IV TIW -Parsabiv 10 mcg IV TIW (Not on hospital formulary) -Hectorol 7 mcg IV TIW  Assessment/Plan: 1.Sepsis/ Proteus bacteremia: Blood culture positive for GNR. Unclear source of infection but probably wound R foot. Was on Ceftriaxone, Azithromycin, discharging  on cipro and doxycycline per primary. 2. RLL PNA: Reports cough resolved, completed course of ceftriaxone and azithromycin.   3. 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 Wound vac in place. Management per vascular. PVD 4. ESRD: MWF-Next HD tomorrow. K+ 4.1. Tight heparin post-op Lowering dry, bp meds 5. Anemia: HGB 7.9. Received aranesp 200 mcg yesterday. Will start mircera for outpatient ESA 6.Secondary hyperparathyroidism: Ca 10.0, Corrected Ca 11.2. Hold hectorol. Parsabiv not available at hospital, resume as outpatient.Phos down to 4.2. Non-compliant with binders as OP, last phos 10.2 05/10/18. Outpatient binder calcium acetate, hold due to ^ Ca. Use non Ca binder 7. HTN/volume:  Amlodipine and metoprolol doses decreased, patient to take these meds at night. No edema on exam today. HD tomorrow per normal MWF schedule with lowered EDW. Discussed change in dosing with primary to correct D/C note 8. Nutrition:  Albumin 2.5, continue pro-stat and renal vitamins 9. Diabetes mellitus: Continue insulin per primary 10 Obesity  Howard Paganini, PA-C 05/18/2018, 9:39 AM  [facility] Pager: [phone] I have seen and examined this patient and agree with the plan of care seen, eval,examined, counseled, discussed with PA and primary .  Trevor Duty 05/18/2018, 10:30 AM

## 2018-05-18 NOTE — Progress Notes (Signed)
Wound vac changed   Report called to LPN at [facility]. Patient's IV was removed. 1st dose of PO antibiotics given prior to discharge.

## 2018-05-18 NOTE — Progress Notes (Signed)
Patient discharging to  SNF, Dr.Duda was paged regarding wound vac, orders received to switch wound vac to Prevena plus

## 2018-05-18 NOTE — Clinical Social Work Placement (Signed)
   CLINICAL SOCIAL WORK PLACEMENT  NOTE *05/18/18 - DISCHARGED TO [facility]  Date:  05/18/2018  Patient Details  Name: Howard Bell MRN: [id number] Date of Birth: 11-Mar-1966  Clinical Social Work is seeking post-discharge placement for this patient at the [place] level of care (*CSW will initial, date and re-position this form in  chart as items are completed):  Yes   Patient/family provided with [facility]'s list of facilities offering this level of care within the geographic area requested by the patient (or if unable, by the patient's family).  Yes   Patient/family informed of their freedom to choose among providers that offer the needed level of care, that participate in Medicare, Medicaid or managed care program needed by the patient, have an available bed and are willing to accept the patient.  Yes   Patient/family informed of [place]'s ownership interest in [facility] and [facility], as well as of the fact that they are under no obligation to receive care at these facilities.  PASRR submitted to EDS on       PASRR number received on       Existing PASRR number confirmed on 05/17/18     FL2 transmitted to all facilities in geographic area requested by pt/family on 05/17/18     FL2 transmitted to all facilities within larger geographic area on       Patient informed that his/her managed care company has contracts with or will negotiate with certain facilities, including the following:        Yes   Patient/family informed of bed offers received.  Patient chooses bed at  [facility]      Physician recommends and patient chooses bed at      Patient to be transferred to  [facility] on 05/18/18.  Patient to be transferred to facility by Ambulance     Patient family notified on 05/11/18 of transfer.  Name of family member notified:  Kenney Houseman McGalliarias -[phone]     PHYSICIAN       Additional Comment:    _______________________________________________ Sable Feil, LCSW 05/18/2018, 12:12 PM

## 2018-05-18 NOTE — Progress Notes (Signed)
Patient left via [facility]. 

## 2018-05-18 NOTE — Discharge Summary (Addendum)
Physician Discharge Summary  Tremaine Earwood DGU:[id number] DOB: 30-Jul-1965 DOA: 05/12/2018  PCP: Horald Pollen, MD  Admit date: 05/12/2018 Discharge date: 05/18/2018  Admitted From: Home Disposition:  SNF  Recommendations for Outpatient Follow-up:  1. Follow up with PCP in 1-2 weeks 2. Please obtain BMP/CBC in one week   Home Health:No Equipment/Devices:None  Discharge Condition:Stable CODE STATUS:Full Diet recommendation: Renal diet  Brief/Interim Summary: 53 y.o. male past medical history of diabetes mellitus type 2 on insulin end-stage renal disease Monday Wednesday and Friday, gastroparesis recently diagnosed with the flu presents with a febrile illness, in the ED his white count was 15 chest x-ray showed probable right lower lobe pneumonia  Discharge Diagnoses:  Principal Problem:   Recurrent pneumonia Active Problems:   Anemia due to end stage renal disease (HCC)   Type 2 diabetes mellitus with foot ulcer ([place])   Essential hypertension   ESRD on dialysis ([place])   Influenza A   Pneumonia   Gangrene of right foot ([place])   Cellulitis of right lower extremity  Sepsis due to Proteus bacteremia in the setting of right lower extremity wound ulcer/diabetic foot: He was started empirically on IV antibiotics orthopedic surgery was consulted 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 Wound VAC was placed. Orthopedic surgery recommended to follow-up as an outpatient, he will go to facility with wound VAC. Physical therapy evaluated the patient the recommended nursing facility.  Possible right lower lobe pneumonia: On admission he was started on Rocephin and azithromycin he completed a 5-day course.  End-stage renal disease on hemodialysis: That he continue dialysis Tuesdays Thursdays and Saturdays.  History of influenza A: Completed Tamiflu treatment.  Discharge Instructions  Discharge Instructions    Diet - low sodium heart healthy   Complete by:  As directed     Increase activity slowly   Complete by:  As directed    Negative Pressure Wound Therapy - Incisional   Complete by:  As directed      Allergies as of 05/18/2018   No Known Allergies     Medication List    STOP taking these medications   amoxicillin-clavulanate 500-125 MG tablet Commonly known as:  AUGMENTIN     TAKE these medications   albuterol 108 (90 Base) MCG/ACT inhaler Commonly known as:  PROVENTIL HFA;VENTOLIN HFA Inhale 1 puff into the lungs every 4 (four) hours as needed for wheezing or shortness of breath.   amLODipine 5 MG tablet Commonly known as:  NORVASC Take 1 tablet (5 mg total) by mouth at bedtime. What changed:    medication strength  how much to take  when to take this   brimonidine 0.2 % ophthalmic solution Commonly known as:  ALPHAGAN Place 1 drop into both eyes 2 (two) times daily.   calcium acetate 667 MG capsule Commonly known as:  PHOSLO Take 2,001 mg by mouth See admin instructions. Take 3 capsules (2001 mg) by mouth up to three times daily with meals   ciprofloxacin 500 MG tablet Commonly known as:  CIPRO Take 0.5 tablets (250 mg total) by mouth daily with breakfast for 14 days.   doxycycline 100 MG tablet Commonly known as:  VIBRA-TABS Take 1 tablet (100 mg total) by mouth 2 (two) times daily.   HUMALOG KWIKPEN 100 UNIT/ML KwikPen Generic drug:  insulin lispro INJECT UP TO 9 UNITS DAILY AS DIRECTED PER SLIDING SCALE What changed:  See the new instructions.   metoCLOPramide 5 MG tablet Commonly known as:  REGLAN Take 1 tablet (5  mg total) by mouth 3 (three) times daily before meals.   metoprolol succinate 50 MG 24 hr tablet Commonly known as:  TOPROL-XL Take 1 tablet (50 mg total) by mouth at bedtime. Take with or immediately following a meal. What changed:    medication strength  See the new instructions.   omeprazole 40 MG capsule Commonly known as:  PRILOSEC Take 40 mg by mouth at bedtime.   ondansetron 4 MG  tablet Commonly known as:  ZOFRAN Take 1 tablet (4 mg total) by mouth every 8 (eight) hours as needed for nausea or vomiting.   ondansetron 8 MG disintegrating tablet Commonly known as:  ZOFRAN ODT Take 1 tablet (8 mg total) by mouth every 8 (eight) hours as needed for nausea or vomiting.   Oxycodone HCl 10 MG Tabs Take 1-1.5 tablets (10-15 mg total) by mouth every 4 (four) hours as needed for severe pain (pain score 7-10).   sevelamer carbonate 800 MG tablet Commonly known as:  RENVELA TAKE 1 TABLET BY MOUTH DAILY WITH MEALS FOR HYPOCALCEMIA   timolol 0.5 % ophthalmic solution Commonly known as:  BETIMOL Place 1 drop into both eyes 2 (two) times daily.   travoprost (benzalkonium) 0.004 % ophthalmic solution Commonly known as:  TRAVATAN Place 1 drop into both eyes at bedtime.      Follow-up Information    Newt Minion, MD In 1 week.   Specialty:  Orthopedic Surgery Contact information: [place] [address] [phone]          No Known Allergies  Consultations:  Nephrology  Orthopedic  Surgery   Procedures/Studies: Dg Chest 2 View  Result Date: 05/12/2018 CLINICAL DATA:  Productive cough.  Fever and nausea. EXAM: CHEST - 2 VIEW COMPARISON:  Chest x-ray and chest CTs dated 05/02/2018, 11/09/2017 and 11/06/2017 FINDINGS: There is persistent haziness in the right lung base posteriorly consistent with the hazy infiltrate seen on the prior study. There is persistent fullness in the mediastinum, demonstrated to be adenopathy on multiple prior chest CTs. Heart size and pulmonary vascularity are normal. Stent in the left subclavian vein. Left lung is clear. IMPRESSION: 1. Persistent faint haziness at the right lung base posteriorly. Persistent mediastinal adenopathy. 2. Given the patient's clinical symptoms, the findings at the right lung base could represent recurrent pneumonia in the right lower lobe. 3. The chronic mediastinal adenopathy raises  the possibility of sarcoidosis. Electronically Signed   By: Lorriane Shire M.D.   On: 05/12/2018 10:58   Dg Chest 2 View  Result Date: 05/02/2018 CLINICAL DATA:  Pt c/o cough, chest pain, and SOB x 2 weeks. Hx of CHF, HTN, ESRD, AND DM. Pt is a nonsmoker. EXAM: CHEST - 2 VIEW COMPARISON:  11/08/2017 FINDINGS: Heart is enlarged. There are prominent interstitial markings with a basilar predominance. No consolidations. No pleural effusions. LEFT-sided endovascular stents. IMPRESSION: Cardiomegaly and interstitial pulmonary edema. Electronically Signed   By: Nolon Nations M.D.   On: 05/02/2018 16:35   Ct Chest Wo Contrast  Result Date: 05/02/2018 CLINICAL DATA:  Shortness of breath EXAM: CT CHEST WITHOUT CONTRAST TECHNIQUE: Multidetector CT imaging of the chest was performed following the standard protocol without IV contrast. COMPARISON:  05/02/2018, 11/09/2017 FINDINGS: Cardiovascular: Somewhat limited due to lack of IV contrast. No cardiac enlargement is noted. Coronary calcifications are seen. Aortic calcifications are noted without aneurysmal dilatation. Considerable left subclavian venous stenting is noted. No enlargement of the pulmonary artery is noted. Mediastinum/Nodes: Thoracic inlet is within normal  limits. Scattered mediastinal lymph nodes are noted most prominent in the right paratracheal region and subcarinal region stable from previous exams. Aortic or pulmonary window nodes are noted as well also stable from the prior exam. Stable left supraclavicular lymph node is noted. The esophagus is within normal limits. Lungs/Pleura: Lungs are well aerated bilaterally. The left lung demonstrates some mild air trapping although no focal infiltrate is seen. Patchy infiltrate is noted within the right lower lobe although improved when compared with the prior exam. A portion of this may be related to scarring. This some right middle and upper lobe infiltrative changes are noted stable from the previous  study. Upper Abdomen: Diffuse vascular calcifications are noted. Stable left adrenal nodule is noted. Musculoskeletal: Degenerative changes of the thoracic spine are noted. IMPRESSION: Patchy infiltrative changes are noted within the right lung as described. These have improved in the interval from the prior exam and may be in part due to scarring. Acute superimposed infiltrate could not be totally excluded. Stable appearing mediastinal lymph nodes similar to that seen on the prior exam. These are again likely reactive given their stability over time with persistent infiltrates. Aortic Atherosclerosis (ICD10-I70.0). Electronically Signed   By: Inez Catalina M.D.   On: 05/02/2018 21:04   Dg Foot Complete Right  Result Date: 05/12/2018 CLINICAL DATA:  Fever in a diabetic patient with a skin wound on the right foot. EXAM: RIGHT FOOT COMPLETE - 3+ VIEW COMPARISON:  Plain films right foot 04/13/2010 FINDINGS: No bony destructive change or periosteal reaction is identified. There is no fracture or dislocation. No evidence of arthropathy. No soft tissue gas or radiopaque foreign body. Extensive vascular calcifications are noted. IMPRESSION: No acute abnormality. Atherosclerosis. Electronically Signed   By: Inge Rise M.D.   On: 05/12/2018 10:51   Vas [place] Comment Art Seg Multi (segm&le Reynauds)  Result Date: 05/05/2018 LOWER EXTREMITY DOPPLER STUDY Indications: Ulceration, and Patient presents with wound of his right foot              secondary to uncontrolled diabetes and neuropathy. Wound is located              on the dorsum of the right foot and 3rd/4th toes. High Risk Factors: Hypertension, Diabetes, no history of smoking. Other Factors: ESRD, left arm AVF, CKD stage 5.  Limitations: Medial calcinosis, non-compressible tibial vessels, difficult              insonation Performing Technologist: Mariane Masters RVT  Examination Guidelines: A complete evaluation includes at minimum, Doppler waveform signals and  systolic blood pressure reading at the level of bilateral brachial, anterior tibial, and posterior tibial arteries, when vessel segments are accessible. Bilateral testing is considered an integral part of a complete examination. Photoelectric Plethysmograph (PPG) waveforms and toe systolic pressure readings are included as required and additional duplex testing as needed. Limited examinations for reoccurring indications may be performed as noted.  ABI Findings: +---------+------------------+-----+---------+----------------+ Right    Rt Pressure (mmHg)IndexWaveform Comment          +---------+------------------+-----+---------+----------------+ Brachial 150                                              +---------+------------------+-----+---------+----------------+ CFA  triphasic                 +---------+------------------+-----+---------+----------------+ Popliteal                       triphasic                 +---------+------------------+-----+---------+----------------+ ATA      254               1.69 triphasicnon-compressible +---------+------------------+-----+---------+----------------+ PTA      254               1.69 biphasic non-compressible +---------+------------------+-----+---------+----------------+ PERO     254               1.69 triphasicnon-compressible +---------+------------------+-----+---------+----------------+ Great Toe254               1.69          non-compressible +---------+------------------+-----+---------+----------------+ +---------+------------------+-----+---------+----------------+ Left     Lt Pressure (mmHg)IndexWaveform Comment          +---------+------------------+-----+---------+----------------+ Brachial                                 Left arm AVF     +---------+------------------+-----+---------+----------------+ CFA                             triphasic                  +---------+------------------+-----+---------+----------------+ Popliteal                       triphasic                 +---------+------------------+-----+---------+----------------+ ATA                             triphasicnon-compressible +---------+------------------+-----+---------+----------------+ PTA                                      unable to record +---------+------------------+-----+---------+----------------+ PERO                            biphasic non-compressible +---------+------------------+-----+---------+----------------+ Leeroy Cha               1.55          non-compressible +---------+------------------+-----+---------+----------------+ +-------+-----------+-----------+------------+------------+ ABI/TBIToday's ABIToday's TBIPrevious ABIPrevious TBI +-------+-----------+-----------+------------+------------+ Right  [place]         [place]                                  +-------+-----------+-----------+------------+------------+ Left   [place]                                           +-------+-----------+-----------+------------+------------+ TOES Findings: +----------+---------------+--------+-------+ Right ToesPressure (mmHg)WaveformComment +----------+---------------+--------+-------+ 1st Digit                Abnormal        +----------+---------------+--------+-------+ 2nd Digit                Abnormal        +----------+---------------+--------+-------+  3rd Digit                Abnormal        +----------+---------------+--------+-------+ 4th Digit                Abnormal        +----------+---------------+--------+-------+ 5th Digit                Abnormal        +----------+---------------+--------+-------+ +---------+---------------+--------+-------+ Left ToesPressure (mmHg)WaveformComment +---------+---------------+--------+-------+ 1st Digit               Abnormal         +---------+---------------+--------+-------+ 2nd Digit               Abnormal        +---------+---------------+--------+-------+ 3rd Digit               Abnormal        +---------+---------------+--------+-------+ 4th Digit               Abnormal        +---------+---------------+--------+-------+ 5th Digit               Abnormal        +---------+---------------+--------+-------+  Arterial wall calcification precludes accurate ankle pressures and ABIs.  Summary: Right: Resting right ankle-brachial index indicates noncompressible right lower extremity arteries. TBIs are unreliable. PPG tracings appear dampened. Left: Resting left ankle-brachial index indicates noncompressible left lower extremity arteries. TBIs are unreliable. PPG tracings appear dampened.  *See table(s) above for measurements and observations. See arterial duplex report. Patient scheduled to see Dr. Gwenlyn Found 05/09/17.  Electronically signed by Larae Grooms MD on 05/05/2018 at 1:54:51 PM.    Final    Vas [place] Lower Extremity Arterial Duplex  Result Date: 05/05/2018 LOWER EXTREMITY ARTERIAL DUPLEX STUDY Indications: Ulceration, and Patient presents with wound of his right foot              secondary to uncontrolled diabetes and neuropathy. Wound is located              on the dorsum of the right foot and 3rd/4th toes. High Risk Factors: Hypertension, Diabetes, no history of smoking. Other Factors: ESRD, left arm AVF, CKD stage 5.  Current ABI: Today's ABI's are non-compressible bilaterally Limitations: Technically challenging exam. Medial calcinosis, patient movement,              difficult insonation and visualization of tibial vessels. Performing Technologist: Mariane Masters RVT  Examination Guidelines: A complete evaluation includes B-mode imaging, spectral Doppler, color Doppler, and power Doppler as needed of all accessible portions of each vessel. Bilateral testing is considered an integral part of a complete  examination. Limited examinations for reoccurring indications may be performed as noted.  Right Duplex Findings: +-----------+--------+-----+--------+---------+--------+            PSV cm/sRatioStenosisWaveform Comments +-----------+--------+-----+--------+---------+--------+ CFA Prox   53                   triphasic         +-----------+--------+-----+--------+---------+--------+ DFA        44                   biphasic          +-----------+--------+-----+--------+---------+--------+ SFA Prox   68                   triphasic         +-----------+--------+-----+--------+---------+--------+ SFA Mid  71                   triphasic         +-----------+--------+-----+--------+---------+--------+ SFA Distal 46                   triphasic         +-----------+--------+-----+--------+---------+--------+ POP Prox   127                  triphasic         +-----------+--------+-----+--------+---------+--------+ POP Distal 70                   triphasic         +-----------+--------+-----+--------+---------+--------+ ATA Prox                        triphasic         +-----------+--------+-----+--------+---------+--------+ ATA Mid    49                   triphasic         +-----------+--------+-----+--------+---------+--------+ ATA Distal 58                   triphasic         +-----------+--------+-----+--------+---------+--------+ PTA Prox   61                   triphasic         +-----------+--------+-----+--------+---------+--------+ PTA Mid    87                   triphasic         +-----------+--------+-----+--------+---------+--------+ PTA Distal 46                   triphasic         +-----------+--------+-----+--------+---------+--------+ PERO Distal31                   triphasic         +-----------+--------+-----+--------+---------+--------+ Medial calcinosis throughout the right lower extremity arteries  without evidence of focal stenosis.  Left Duplex Findings: +----------+--------+-----+--------+---------+--------+           PSV cm/sRatioStenosisWaveform Comments +----------+--------+-----+--------+---------+--------+ CFA Prox  58                   triphasic         +----------+--------+-----+--------+---------+--------+ DFA       60                   biphasic          +----------+--------+-----+--------+---------+--------+ SFA Prox  55                   triphasic         +----------+--------+-----+--------+---------+--------+ SFA Mid   54                   biphasic          +----------+--------+-----+--------+---------+--------+ SFA Distal41                   triphasic         +----------+--------+-----+--------+---------+--------+ POP Prox  48                   triphasic         +----------+--------+-----+--------+---------+--------+ POP Distal35  triphasic         +----------+--------+-----+--------+---------+--------+ ATA Distal27                   triphasic         +----------+--------+-----+--------+---------+--------+ PTA Distal29                   triphasic         +----------+--------+-----+--------+---------+--------+ PERO Mid  34                   triphasic         +----------+--------+-----+--------+---------+--------+ Medial calcinosis throughout the left lower extremity arteries without evidence of focal stenosis.  Summary: Right: Near normal examination. Medial calcinosis throughout the right lower extremity arteries without evidence of focal stenosis. Left: Near normal examination. Medial calcinosis throughout the left lower extremity arteries without evidence of focal stenosis.  See table(s) above for measurements and observations. See arterial doppler report. Patient scheduled with Dr. Gwenlyn Found 05/09/17. Electronically signed by Larae Grooms MD on 05/05/2018 at 1:57:19 PM.    Final    (Echo, Carotid,  EGD, Colonoscopy, ERCP)    Subjective:  Complaints feels great. Discharge Exam: Vitals:   05/17/18 2207 05/18/18 0549  BP: 121/78 (!) 146/71  Pulse: 93 94  Resp: 18 20  Temp: 98.5 F (36.9 C) 97.6 F (36.4 C)  SpO2: 99% (!) 86%     General: Pt is alert, awake, not in acute distress Cardiovascular: RRR, S1/S2 +, no rubs, no gallops Respiratory: CTA bilaterally, no wheezing, no rhonchi Abdominal: Soft, NT, ND, bowel sounds + Extremities: no edema, no cyanosis    The results of significant diagnostics from this hospitalization (including imaging, microbiology, ancillary and laboratory) are listed below for reference.     Microbiology: Recent Results (from the past 240 hour(s))  Blood culture (routine x 2)     Status: None   Collection Time: 05/12/18  9:50 AM  Result Value Ref Range Status   Specimen Description BLOOD RIGHT ANTECUBITAL  Final   Special Requests   Final    BOTTLES DRAWN AEROBIC AND ANAEROBIC Blood Culture adequate volume   Culture   Final    NO GROWTH 5 DAYS Performed at [facility], [address], [place], [place] [address]    Report Status 05/17/2018 FINAL  Final  Blood culture (routine x 2)     Status: Abnormal   Collection Time: 05/12/18 10:00 AM  Result Value Ref Range Status   Specimen Description BLOOD RIGHT FOREARM  Final   Special Requests   Final    BOTTLES DRAWN AEROBIC AND ANAEROBIC Blood Culture adequate volume   Culture  Setup Time   Final    ABUNDANT GRAM NEGATIVE RODS CRITICAL RESULT CALLED TO, READ BACK BY AND VERIFIED WITH: L SEAY @ 0222 ON 05/13/18 BY ROBINSON Z. WITH BCID RESULTS    Culture PROTEUS MIRABILIS (A)  Final   Report Status 05/15/2018 FINAL  Final   Organism ID, Bacteria PROTEUS MIRABILIS  Final      Susceptibility   Proteus mirabilis - MIC*    AMPICILLIN <=2 SENSITIVE Sensitive     CEFAZOLIN <=4 SENSITIVE Sensitive     CEFEPIME <=1 SENSITIVE Sensitive     CEFTAZIDIME <=1 SENSITIVE Sensitive      CEFTRIAXONE <=1 SENSITIVE Sensitive     CIPROFLOXACIN <=0.25 SENSITIVE Sensitive     GENTAMICIN <=1 SENSITIVE Sensitive     IMIPENEM 1 SENSITIVE Sensitive     TRIMETH/SULFA <=20  SENSITIVE Sensitive     AMPICILLIN/SULBACTAM <=2 SENSITIVE Sensitive     PIP/TAZO <=4 SENSITIVE Sensitive     * PROTEUS MIRABILIS  Blood Culture ID Panel (Reflexed)     Status: Abnormal   Collection Time: 05/12/18 10:00 AM  Result Value Ref Range Status   Enterococcus species NOT DETECTED NOT DETECTED Final   Listeria monocytogenes NOT DETECTED NOT DETECTED Final   Staphylococcus species NOT DETECTED NOT DETECTED Final   Staphylococcus aureus (BCID) NOT DETECTED NOT DETECTED Final   Streptococcus species NOT DETECTED NOT DETECTED Final   Streptococcus agalactiae NOT DETECTED NOT DETECTED Final   Streptococcus pneumoniae NOT DETECTED NOT DETECTED Final   Streptococcus pyogenes NOT DETECTED NOT DETECTED Final   Acinetobacter baumannii NOT DETECTED NOT DETECTED Final   Enterobacteriaceae species DETECTED (A) NOT DETECTED Final    Comment: Enterobacteriaceae represent a large family of gram-negative bacteria, not a single organism. CRITICAL RESULT CALLED TO, READ BACK BY AND VERIFIED WITH: L SEAY @ 0222 ON 05/13/18 BY ROBINSON Z.     Enterobacter cloacae complex NOT DETECTED NOT DETECTED Final   Escherichia coli NOT DETECTED NOT DETECTED Final   Klebsiella oxytoca NOT DETECTED NOT DETECTED Final   Klebsiella pneumoniae NOT DETECTED NOT DETECTED Final   Proteus species DETECTED (A) NOT DETECTED Final    Comment: CRITICAL RESULT CALLED TO, READ BACK BY AND VERIFIED WITH: L SEAY @ 0222 ON 05/13/18 BY ROBINSON Z.    Serratia marcescens NOT DETECTED NOT DETECTED Final   Carbapenem resistance NOT DETECTED NOT DETECTED Final   Haemophilus influenzae NOT DETECTED NOT DETECTED Final   Neisseria meningitidis NOT DETECTED NOT DETECTED Final   Pseudomonas aeruginosa NOT DETECTED NOT DETECTED Final   Candida albicans NOT  DETECTED NOT DETECTED Final   Candida glabrata NOT DETECTED NOT DETECTED Final   Candida krusei NOT DETECTED NOT DETECTED Final   Candida parapsilosis NOT DETECTED NOT DETECTED Final   Candida tropicalis NOT DETECTED NOT DETECTED Final  MRSA PCR Screening     Status: Abnormal   Collection Time: 05/12/18  5:01 PM  Result Value Ref Range Status   MRSA by PCR POSITIVE (A) NEGATIVE Final    Comment:        The GeneXpert MRSA Assay (FDA approved for NASAL specimens only), is one component of a comprehensive MRSA colonization surveillance program. It is not intended to diagnose MRSA infection nor to guide or monitor treatment for MRSA infections. RESULT CALLED TO, READ BACK BY AND VERIFIED WITH: Leandro Reasoner RN 05/12/18 2116 JDW Performed at [facility], [place] [address], [place], [place] [address]   Surgical pcr screen     Status: Abnormal   Collection Time: 05/14/18  9:39 AM  Result Value Ref Range Status   MRSA, PCR POSITIVE (A) NEGATIVE Final    Comment: RESULT CALLED TO, READ BACK BY AND VERIFIED WITH: A.RIA RN AT 1215 05/14/18 BY A.DAVIS    Staphylococcus aureus POSITIVE (A) NEGATIVE Final    Comment: (NOTE) The Xpert SA Assay (FDA approved for NASAL specimens in patients 61 years of age and older), is one component of a comprehensive surveillance program. It is not intended to diagnose infection nor to guide or monitor treatment. Performed at [facility], [place] [address], [place], [place] [address]      Labs: BNP (last 3 results) Recent Labs    11/06/17 0841 05/02/18 1930  BNP 1,323.3* 098.1*   Basic Metabolic Panel: Recent Labs  Lab 05/12/18 0950  05/13/18 1128 05/14/18 1520 05/15/18 0922 05/16/18 0523 05/17/18 0345  NA 133* 132* 133* 133* 132* 130*  K 4.9 5.0 4.5 3.5 4.1 4.3  CL 90* 88*  --  93* 91* 92*  CO2 26 25  --  28 27 24   GLUCOSE 130* 203*  --  135* 165* 174*  BUN 45* 62*  --  28* 33* 51*  CREATININE 11.91* 13.79*  --  6.46* 7.83* 10.16*   CALCIUM 8.1* 8.2*  --  8.5* 9.5 10.0  PHOS  --  8.8*  --  4.2  --   --    Liver Function Tests: Recent Labs  Lab 05/12/18 0950 05/13/18 1128 05/15/18 0922  AST 16  --   --   ALT 11  --   --   ALKPHOS 83  --   --   BILITOT 1.0  --   --   PROT 8.2*  --   --   ALBUMIN 2.7* 2.3* 2.5*   No results for input(s): LIPASE, AMYLASE in the last 168 hours. No results for input(s): AMMONIA in the last 168 hours. CBC: Recent Labs  Lab 05/12/18 0950 05/13/18 1128 05/14/18 1520 05/15/18 0921 05/16/18 0523 05/17/18 0345  WBC 15.4* 18.9*  --  12.6* 12.9* 13.5*  NEUTROABS 12.1*  --   --   --   --   --   HGB 10.2* 10.0* 8.8* 9.0* 8.5* 7.9*  HCT 32.2* 31.0* 26.0* 29.3* 27.8* 25.5*  MCV 93.6 92.3  --  93.6 95.9 92.7  PLT 280 247  --  246 245 252   Cardiac Enzymes: No results for input(s): CKTOTAL, CKMB, CKMBINDEX, TROPONINI in the last 168 hours. BNP: Invalid input(s): POCBNP CBG: Recent Labs  Lab 05/16/18 2036 05/17/18 1224 05/17/18 1618 05/17/18 2203 05/18/18 0751  GLUCAP 152* 135* 163* 165* 265*   D-Dimer No results for input(s): DDIMER in the last 72 hours. Hgb A1c No results for input(s): HGBA1C in the last 72 hours. Lipid Profile No results for input(s): CHOL, HDL, LDLCALC, TRIG, CHOLHDL, LDLDIRECT in the last 72 hours. Thyroid function studies No results for input(s): TSH, T4TOTAL, T3FREE, THYROIDAB in the last 72 hours.  Invalid input(s): FREET3 Anemia work up No results for input(s): VITAMINB12, FOLATE, FERRITIN, TIBC, IRON, RETICCTPCT in the last 72 hours. Urinalysis    Component Value Date/Time   COLORURINE RED (A) 11/19/2016 0020   APPEARANCEUR TURBID (A) 11/19/2016 0020   LABSPEC  11/19/2016 0020    TEST NOT REPORTED DUE TO COLOR INTERFERENCE OF URINE PIGMENT   PHURINE  11/19/2016 0020    TEST NOT REPORTED DUE TO COLOR INTERFERENCE OF URINE PIGMENT   GLUCOSEU (A) 11/19/2016 0020    TEST NOT REPORTED DUE TO COLOR INTERFERENCE OF URINE PIGMENT   HGBUR (A)  11/19/2016 0020    TEST NOT REPORTED DUE TO COLOR INTERFERENCE OF URINE PIGMENT   BILIRUBINUR (A) 11/19/2016 0020    TEST NOT REPORTED DUE TO COLOR INTERFERENCE OF URINE PIGMENT   KETONESUR (A) 11/19/2016 0020    TEST NOT REPORTED DUE TO COLOR INTERFERENCE OF URINE PIGMENT   PROTEINUR (A) 11/19/2016 0020    TEST NOT REPORTED DUE TO COLOR INTERFERENCE OF URINE PIGMENT   NITRITE (A) 11/19/2016 0020    TEST NOT REPORTED DUE TO COLOR INTERFERENCE OF URINE PIGMENT   LEUKOCYTESUR (A) 11/19/2016 0020    TEST NOT REPORTED DUE TO COLOR INTERFERENCE OF URINE PIGMENT   Sepsis Labs Invalid input(s): PROCALCITONIN,  WBC,  [facility]  Recent Results (from the past 240 hour(s))  Blood culture (routine x 2)     Status: None   Collection Time: 05/12/18  9:50 AM  Result Value Ref Range Status   Specimen Description BLOOD RIGHT ANTECUBITAL  Final   Special Requests   Final    BOTTLES DRAWN AEROBIC AND ANAEROBIC Blood Culture adequate volume   Culture   Final    NO GROWTH 5 DAYS Performed at [facility], [address] [place], [place] [address]    Report Status 05/17/2018 FINAL  Final  Blood culture (routine x 2)     Status: Abnormal   Collection Time: 05/12/18 10:00 AM  Result Value Ref Range Status   Specimen Description BLOOD RIGHT FOREARM  Final   Special Requests   Final    BOTTLES DRAWN AEROBIC AND ANAEROBIC Blood Culture adequate volume   Culture  Setup Time   Final    ABUNDANT GRAM NEGATIVE RODS CRITICAL RESULT CALLED TO, READ BACK BY AND VERIFIED WITH: L SEAY @ 0222 ON 05/13/18 BY ROBINSON Z. WITH BCID RESULTS    Culture PROTEUS MIRABILIS (A)  Final   Report Status 05/15/2018 FINAL  Final   Organism ID, Bacteria PROTEUS MIRABILIS  Final      Susceptibility   Proteus mirabilis - MIC*    AMPICILLIN <=2 SENSITIVE Sensitive     CEFAZOLIN <=4 SENSITIVE Sensitive     CEFEPIME <=1 SENSITIVE Sensitive     CEFTAZIDIME <=1 SENSITIVE Sensitive     CEFTRIAXONE <=1  SENSITIVE Sensitive     CIPROFLOXACIN <=0.25 SENSITIVE Sensitive     GENTAMICIN <=1 SENSITIVE Sensitive     IMIPENEM 1 SENSITIVE Sensitive     TRIMETH/SULFA <=20 SENSITIVE Sensitive     AMPICILLIN/SULBACTAM <=2 SENSITIVE Sensitive     PIP/TAZO <=4 SENSITIVE Sensitive     * PROTEUS MIRABILIS  Blood Culture ID Panel (Reflexed)     Status: Abnormal   Collection Time: 05/12/18 10:00 AM  Result Value Ref Range Status   Enterococcus species NOT DETECTED NOT DETECTED Final   Listeria monocytogenes NOT DETECTED NOT DETECTED Final   Staphylococcus species NOT DETECTED NOT DETECTED Final   Staphylococcus aureus (BCID) NOT DETECTED NOT DETECTED Final   Streptococcus species NOT DETECTED NOT DETECTED Final   Streptococcus agalactiae NOT DETECTED NOT DETECTED Final   Streptococcus pneumoniae NOT DETECTED NOT DETECTED Final   Streptococcus pyogenes NOT DETECTED NOT DETECTED Final   Acinetobacter baumannii NOT DETECTED NOT DETECTED Final   Enterobacteriaceae species DETECTED (A) NOT DETECTED Final    Comment: Enterobacteriaceae represent a large family of gram-negative bacteria, not a single organism. CRITICAL RESULT CALLED TO, READ BACK BY AND VERIFIED WITH: L SEAY @ 0222 ON 05/13/18 BY ROBINSON Z.     Enterobacter cloacae complex NOT DETECTED NOT DETECTED Final   Escherichia coli NOT DETECTED NOT DETECTED Final   Klebsiella oxytoca NOT DETECTED NOT DETECTED Final   Klebsiella pneumoniae NOT DETECTED NOT DETECTED Final   Proteus species DETECTED (A) NOT DETECTED Final    Comment: CRITICAL RESULT CALLED TO, READ BACK BY AND VERIFIED WITH: L SEAY @ 0222 ON 05/13/18 BY ROBINSON Z.    Serratia marcescens NOT DETECTED NOT DETECTED Final   Carbapenem resistance NOT DETECTED NOT DETECTED Final   Haemophilus influenzae NOT DETECTED NOT DETECTED Final   Neisseria meningitidis NOT DETECTED NOT DETECTED Final   Pseudomonas aeruginosa NOT DETECTED NOT DETECTED Final   Candida albicans NOT DETECTED NOT  DETECTED Final  Candida glabrata NOT DETECTED NOT DETECTED Final   Candida krusei NOT DETECTED NOT DETECTED Final   Candida parapsilosis NOT DETECTED NOT DETECTED Final   Candida tropicalis NOT DETECTED NOT DETECTED Final  MRSA PCR Screening     Status: Abnormal   Collection Time: 05/12/18  5:01 PM  Result Value Ref Range Status   MRSA by PCR POSITIVE (A) NEGATIVE Final    Comment:        The GeneXpert MRSA Assay (FDA approved for NASAL specimens only), is one component of a comprehensive MRSA colonization surveillance program. It is not intended to diagnose MRSA infection nor to guide or monitor treatment for MRSA infections. RESULT CALLED TO, READ BACK BY AND VERIFIED WITH: Leandro Reasoner RN 05/12/18 2116 JDW Performed at [facility], [place] [address], [place], [place] [address]   Surgical pcr screen     Status: Abnormal   Collection Time: 05/14/18  9:39 AM  Result Value Ref Range Status   MRSA, PCR POSITIVE (A) NEGATIVE Final    Comment: RESULT CALLED TO, READ BACK BY AND VERIFIED WITH: A.RIA RN AT 1215 05/14/18 BY A.DAVIS    Staphylococcus aureus POSITIVE (A) NEGATIVE Final    Comment: (NOTE) The Xpert SA Assay (FDA approved for NASAL specimens in patients 16 years of age and older), is one component of a comprehensive surveillance program. It is not intended to diagnose infection nor to guide or monitor treatment. Performed at [facility], [place] [address] [place], [place] [address]      Time coordinating discharge: 40 minutes  SIGNED:   Charlynne Cousins, MD  [facility]

## 2018-05-19 DIAGNOSIS — D631 Anemia in chronic kidney disease: Secondary | ICD-10-CM | POA: Diagnosis not present

## 2018-05-19 DIAGNOSIS — D509 Iron deficiency anemia, unspecified: Secondary | ICD-10-CM | POA: Diagnosis not present

## 2018-05-19 DIAGNOSIS — N186 End stage renal disease: Secondary | ICD-10-CM | POA: Diagnosis not present

## 2018-05-19 DIAGNOSIS — E1129 Type 2 diabetes mellitus with other diabetic kidney complication: Secondary | ICD-10-CM | POA: Diagnosis not present

## 2018-05-19 DIAGNOSIS — N2581 Secondary hyperparathyroidism of renal origin: Secondary | ICD-10-CM | POA: Diagnosis not present

## 2018-05-20 DIAGNOSIS — T8130XA Disruption of wound, unspecified, initial encounter: Secondary | ICD-10-CM | POA: Diagnosis not present

## 2018-05-20 DIAGNOSIS — E118 Type 2 diabetes mellitus with unspecified complications: Secondary | ICD-10-CM | POA: Diagnosis not present

## 2018-05-20 DIAGNOSIS — E1122 Type 2 diabetes mellitus with diabetic chronic kidney disease: Secondary | ICD-10-CM | POA: Diagnosis not present

## 2018-05-21 DIAGNOSIS — E1129 Type 2 diabetes mellitus with other diabetic kidney complication: Secondary | ICD-10-CM | POA: Diagnosis not present

## 2018-05-21 DIAGNOSIS — N186 End stage renal disease: Secondary | ICD-10-CM | POA: Diagnosis not present

## 2018-05-21 DIAGNOSIS — Z992 Dependence on renal dialysis: Secondary | ICD-10-CM | POA: Diagnosis not present

## 2018-05-22 DIAGNOSIS — G894 Chronic pain syndrome: Secondary | ICD-10-CM | POA: Diagnosis not present

## 2018-05-22 DIAGNOSIS — T8130XA Disruption of wound, unspecified, initial encounter: Secondary | ICD-10-CM | POA: Diagnosis not present

## 2018-05-22 DIAGNOSIS — D631 Anemia in chronic kidney disease: Secondary | ICD-10-CM | POA: Diagnosis not present

## 2018-05-22 DIAGNOSIS — N2581 Secondary hyperparathyroidism of renal origin: Secondary | ICD-10-CM | POA: Diagnosis not present

## 2018-05-22 DIAGNOSIS — D509 Iron deficiency anemia, unspecified: Secondary | ICD-10-CM | POA: Diagnosis not present

## 2018-05-22 DIAGNOSIS — N186 End stage renal disease: Secondary | ICD-10-CM | POA: Diagnosis not present

## 2018-05-22 DIAGNOSIS — E118 Type 2 diabetes mellitus with unspecified complications: Secondary | ICD-10-CM | POA: Diagnosis not present

## 2018-05-24 DIAGNOSIS — N186 End stage renal disease: Secondary | ICD-10-CM | POA: Diagnosis not present

## 2018-05-24 DIAGNOSIS — D631 Anemia in chronic kidney disease: Secondary | ICD-10-CM | POA: Diagnosis not present

## 2018-05-24 DIAGNOSIS — N2581 Secondary hyperparathyroidism of renal origin: Secondary | ICD-10-CM | POA: Diagnosis not present

## 2018-05-24 DIAGNOSIS — D509 Iron deficiency anemia, unspecified: Secondary | ICD-10-CM | POA: Diagnosis not present

## 2018-05-26 DIAGNOSIS — E11621 Type 2 diabetes mellitus with foot ulcer: Secondary | ICD-10-CM | POA: Diagnosis not present

## 2018-05-26 DIAGNOSIS — G894 Chronic pain syndrome: Secondary | ICD-10-CM | POA: Diagnosis not present

## 2018-05-26 DIAGNOSIS — E118 Type 2 diabetes mellitus with unspecified complications: Secondary | ICD-10-CM | POA: Diagnosis not present

## 2018-05-26 DIAGNOSIS — N2581 Secondary hyperparathyroidism of renal origin: Secondary | ICD-10-CM | POA: Diagnosis not present

## 2018-05-26 DIAGNOSIS — N186 End stage renal disease: Secondary | ICD-10-CM | POA: Diagnosis not present

## 2018-05-26 DIAGNOSIS — D509 Iron deficiency anemia, unspecified: Secondary | ICD-10-CM | POA: Diagnosis not present

## 2018-05-26 DIAGNOSIS — S98911S Complete traumatic amputation of right foot, level unspecified, sequela: Secondary | ICD-10-CM | POA: Diagnosis not present

## 2018-05-26 DIAGNOSIS — D631 Anemia in chronic kidney disease: Secondary | ICD-10-CM | POA: Diagnosis not present

## 2018-05-29 DIAGNOSIS — N186 End stage renal disease: Secondary | ICD-10-CM | POA: Diagnosis not present

## 2018-05-29 DIAGNOSIS — D631 Anemia in chronic kidney disease: Secondary | ICD-10-CM | POA: Diagnosis not present

## 2018-05-29 DIAGNOSIS — N2581 Secondary hyperparathyroidism of renal origin: Secondary | ICD-10-CM | POA: Diagnosis not present

## 2018-05-29 DIAGNOSIS — D509 Iron deficiency anemia, unspecified: Secondary | ICD-10-CM | POA: Diagnosis not present

## 2018-05-30 ENCOUNTER — Ambulatory Visit (INDEPENDENT_AMBULATORY_CARE_PROVIDER_SITE_OTHER): Payer: Medicare Other | Admitting: Physician Assistant

## 2018-05-30 ENCOUNTER — Encounter (INDEPENDENT_AMBULATORY_CARE_PROVIDER_SITE_OTHER): Payer: Self-pay | Admitting: Orthopedic Surgery

## 2018-05-30 DIAGNOSIS — Z992 Dependence on renal dialysis: Secondary | ICD-10-CM

## 2018-05-30 DIAGNOSIS — E440 Moderate protein-calorie malnutrition: Secondary | ICD-10-CM

## 2018-05-30 DIAGNOSIS — E1142 Type 2 diabetes mellitus with diabetic polyneuropathy: Secondary | ICD-10-CM

## 2018-05-30 DIAGNOSIS — N186 End stage renal disease: Secondary | ICD-10-CM

## 2018-05-30 DIAGNOSIS — Z794 Long term (current) use of insulin: Secondary | ICD-10-CM

## 2018-05-30 DIAGNOSIS — Z89511 Acquired absence of right leg below knee: Secondary | ICD-10-CM

## 2018-05-30 NOTE — Progress Notes (Signed)
Office Visit Note   Patient: Howard Bell           Date of Birth: May 19, 1965           MRN: [id number] Visit Date: 05/30/2018              Requested by: Horald Pollen, MD [place], [place] [address] PCP: Horald Pollen, MD  Chief Complaint  Patient presents with  . 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  He has been and skilled nursing for rehabilitation.  He has had the [facility] in place but thinks that it stopped working sometime last night.  He reports moderate pain over the residual limb.  Assessment & Plan: Visit Diagnoses:  1. Acquired absence of right lower extremity below knee ([place])   2. Type 2 diabetes mellitus with diabetic polyneuropathy, with long-term current use of insulin ([place])   3. ESRD on dialysis ([place])   4. Moderate protein malnutrition ([place])     Plan: The Praveena VAC was removed this visit.  Orders were sent with the patient for the skilled nursing facility for daily dressing changes initially with 4 x 4 gauze and Ace wrapping until he is draining less and then they can resume his silver stump shrinker stocking daily.  They may wash the residual limb daily with soap and water and then apply the above dressings.  He should continue his stump protector.  He will follow-up in 1 week.  Follow-Up Instructions: Return in about 1 week (around 06/06/2018).   Ortho Exam  Patient is alert, oriented, no adenopathy, well-dressed, normal affect, normal respiratory effort. The Praveena VAC was removed from the right below the knee amputation site and there is some serous sanguinous drainage from the incisional area.  Staples are intact.  There is moderate edema of the distal residual limb.  No signs of cellulitis.  He has full knee  extension and good flexion.  Imaging: No results found.   Labs: Lab Results  Component Value Date   HGBA1C 8.3 (H) 05/12/2018   HGBA1C 8.5 (A) 04/01/2018   HGBA1C 8.5 01/20/2017   REPTSTATUS 05/15/2018 FINAL 05/12/2018   CULT PROTEUS MIRABILIS (A) 05/12/2018   LABORGA PROTEUS MIRABILIS 05/12/2018     Lab Results  Component Value Date   ALBUMIN 2.5 (L) 05/15/2018   ALBUMIN 2.3 (L) 05/13/2018   ALBUMIN 2.7 (L) 05/12/2018    There is no height or weight on file to calculate BMI.  Orders:  No orders of the defined types were placed in this encounter.  No orders of the defined types were placed in this encounter.    Procedures: No procedures performed  Clinical Data: No additional findings.  ROS:  All other systems negative, except as noted in the HPI. Review of Systems  Objective: Vital Signs: There were no vitals taken for this visit.  Specialty Comments:  No specialty comments available.  PMFS History: Patient Active Problem List   Diagnosis Date Noted  . Cellulitis of right lower extremity   . Influenza A 05/12/2018  . Pneumonia 05/12/2018  . Gangrene of right foot ([place])   . Critical limb ischemia with history of revascularization of same extremity 05/09/2018  . LUQ pain   . Benign  hypertensive heart and kidney disease with HF and CKD stage V ([place]) 11/16/2017  . 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 ([place]) 11/16/2017  . CKD stage 5 due to type 2 diabetes mellitus ([place]) 11/16/2017  . Glaucoma due to type 2 diabetes mellitus ([place]) 11/16/2017  . Chronic generalized pain 11/16/2017  . Recurrent pneumonia 11/06/2017  . Diabetic gastroparesis ([place]) 11/06/2017  . Generalized abdominal pain 09/13/2017  . Acute pain of right shoulder 07/26/2017  . Acute bursitis of right shoulder 07/26/2017  . Left arm swelling 03/28/2017  . ESRD on dialysis ([place]) 03/28/2017  . Chest pain 11/26/2016  . Essential hypertension  11/26/2016  . Anemia due to end stage renal disease ([place]) 08/04/2016  . Acute respiratory failure with hypoxemia ([place]) 08/04/2016  . Type 2 diabetes mellitus with foot ulcer ([place]) 08/04/2016  . Acute CHF (congestive heart failure) ([place]) 08/04/2016  . Elevated troponin    Past Medical History:  Diagnosis Date  . Congestive heart failure (CHF) ([place])   . 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 Diabetic gastroparesis ([place]) 11/06/2017  . ESRD (end stage renal disease) on dialysis ([place])   . Hypertension     Family History  Problem Relation Age of Onset  . Diabetes Mother   . Hypertension Father     Past Surgical History:  Procedure Laterality Date  . AMPUTATION Right 05/14/2018   Procedure: AMPUTATION BELOW KNEE;  Surgeon: Newt Minion, MD;  Location: [facility];  Service: Orthopedics;  Laterality: Right;  . ESOPHAGOGASTRODUODENOSCOPY (EGD) WITH PROPOFOL N/A 12/05/2017   Procedure: ESOPHAGOGASTRODUODENOSCOPY (EGD) WITH PROPOFOL;  Surgeon: Doran Stabler, MD;  Location: [facility];  Service: Gastroenterology;  Laterality: N/A;  . HERNIA REPAIR    . IR AV DIALY SHUNT INTRO NEEDLE/INTRACATH INITIAL W/PTA/IMG LEFT  03/30/2017   Social History   Occupational History  . Not on file  Tobacco Use  . Smoking status: Never Smoker  . Smokeless tobacco: Never Used  Substance and Sexual Activity  . Alcohol use: No  . Drug use: No  . Sexual activity: Not on file

## 2018-05-31 DIAGNOSIS — D631 Anemia in chronic kidney disease: Secondary | ICD-10-CM | POA: Diagnosis not present

## 2018-05-31 DIAGNOSIS — N186 End stage renal disease: Secondary | ICD-10-CM | POA: Diagnosis not present

## 2018-05-31 DIAGNOSIS — D509 Iron deficiency anemia, unspecified: Secondary | ICD-10-CM | POA: Diagnosis not present

## 2018-05-31 DIAGNOSIS — N2581 Secondary hyperparathyroidism of renal origin: Secondary | ICD-10-CM | POA: Diagnosis not present

## 2018-06-02 ENCOUNTER — Other Ambulatory Visit (HOSPITAL_COMMUNITY): Payer: Medicare Other

## 2018-06-02 DIAGNOSIS — D509 Iron deficiency anemia, unspecified: Secondary | ICD-10-CM | POA: Diagnosis not present

## 2018-06-02 DIAGNOSIS — E118 Type 2 diabetes mellitus with unspecified complications: Secondary | ICD-10-CM | POA: Diagnosis not present

## 2018-06-02 DIAGNOSIS — N2581 Secondary hyperparathyroidism of renal origin: Secondary | ICD-10-CM | POA: Diagnosis not present

## 2018-06-02 DIAGNOSIS — D631 Anemia in chronic kidney disease: Secondary | ICD-10-CM | POA: Diagnosis not present

## 2018-06-02 DIAGNOSIS — E1122 Type 2 diabetes mellitus with diabetic chronic kidney disease: Secondary | ICD-10-CM | POA: Diagnosis not present

## 2018-06-02 DIAGNOSIS — G894 Chronic pain syndrome: Secondary | ICD-10-CM | POA: Diagnosis not present

## 2018-06-02 DIAGNOSIS — N186 End stage renal disease: Secondary | ICD-10-CM | POA: Diagnosis not present

## 2018-06-02 DIAGNOSIS — R2230 Localized swelling, mass and lump, unspecified upper limb: Secondary | ICD-10-CM | POA: Diagnosis not present

## 2018-06-05 DIAGNOSIS — D631 Anemia in chronic kidney disease: Secondary | ICD-10-CM | POA: Diagnosis not present

## 2018-06-05 DIAGNOSIS — D509 Iron deficiency anemia, unspecified: Secondary | ICD-10-CM | POA: Diagnosis not present

## 2018-06-05 DIAGNOSIS — N186 End stage renal disease: Secondary | ICD-10-CM | POA: Diagnosis not present

## 2018-06-05 DIAGNOSIS — N2581 Secondary hyperparathyroidism of renal origin: Secondary | ICD-10-CM | POA: Diagnosis not present

## 2018-06-06 ENCOUNTER — Other Ambulatory Visit (HOSPITAL_COMMUNITY): Payer: Medicare Other

## 2018-06-07 DIAGNOSIS — D631 Anemia in chronic kidney disease: Secondary | ICD-10-CM | POA: Diagnosis not present

## 2018-06-07 DIAGNOSIS — N2581 Secondary hyperparathyroidism of renal origin: Secondary | ICD-10-CM | POA: Diagnosis not present

## 2018-06-07 DIAGNOSIS — D509 Iron deficiency anemia, unspecified: Secondary | ICD-10-CM | POA: Diagnosis not present

## 2018-06-07 DIAGNOSIS — N186 End stage renal disease: Secondary | ICD-10-CM | POA: Diagnosis not present

## 2018-06-08 ENCOUNTER — Ambulatory Visit (INDEPENDENT_AMBULATORY_CARE_PROVIDER_SITE_OTHER): Payer: Medicare Other | Admitting: Orthopedic Surgery

## 2018-06-09 DIAGNOSIS — N2581 Secondary hyperparathyroidism of renal origin: Secondary | ICD-10-CM | POA: Diagnosis not present

## 2018-06-09 DIAGNOSIS — N186 End stage renal disease: Secondary | ICD-10-CM | POA: Diagnosis not present

## 2018-06-09 DIAGNOSIS — D509 Iron deficiency anemia, unspecified: Secondary | ICD-10-CM | POA: Diagnosis not present

## 2018-06-09 DIAGNOSIS — D631 Anemia in chronic kidney disease: Secondary | ICD-10-CM | POA: Diagnosis not present

## 2018-06-12 DIAGNOSIS — R2230 Localized swelling, mass and lump, unspecified upper limb: Secondary | ICD-10-CM | POA: Diagnosis not present

## 2018-06-12 DIAGNOSIS — E1122 Type 2 diabetes mellitus with diabetic chronic kidney disease: Secondary | ICD-10-CM | POA: Diagnosis not present

## 2018-06-12 DIAGNOSIS — D509 Iron deficiency anemia, unspecified: Secondary | ICD-10-CM | POA: Diagnosis not present

## 2018-06-12 DIAGNOSIS — N2581 Secondary hyperparathyroidism of renal origin: Secondary | ICD-10-CM | POA: Diagnosis not present

## 2018-06-12 DIAGNOSIS — N186 End stage renal disease: Secondary | ICD-10-CM | POA: Diagnosis not present

## 2018-06-12 DIAGNOSIS — S98911S Complete traumatic amputation of right foot, level unspecified, sequela: Secondary | ICD-10-CM | POA: Diagnosis not present

## 2018-06-12 DIAGNOSIS — D631 Anemia in chronic kidney disease: Secondary | ICD-10-CM | POA: Diagnosis not present

## 2018-06-13 DIAGNOSIS — L03116 Cellulitis of left lower limb: Secondary | ICD-10-CM | POA: Diagnosis not present

## 2018-06-13 DIAGNOSIS — M6281 Muscle weakness (generalized): Secondary | ICD-10-CM | POA: Diagnosis not present

## 2018-06-13 DIAGNOSIS — Z7409 Other reduced mobility: Secondary | ICD-10-CM | POA: Diagnosis not present

## 2018-06-14 DIAGNOSIS — D631 Anemia in chronic kidney disease: Secondary | ICD-10-CM | POA: Diagnosis not present

## 2018-06-14 DIAGNOSIS — N186 End stage renal disease: Secondary | ICD-10-CM | POA: Diagnosis not present

## 2018-06-14 DIAGNOSIS — N2581 Secondary hyperparathyroidism of renal origin: Secondary | ICD-10-CM | POA: Diagnosis not present

## 2018-06-14 DIAGNOSIS — D509 Iron deficiency anemia, unspecified: Secondary | ICD-10-CM | POA: Diagnosis not present

## 2018-06-15 DIAGNOSIS — N186 End stage renal disease: Secondary | ICD-10-CM | POA: Diagnosis not present

## 2018-06-15 DIAGNOSIS — R2230 Localized swelling, mass and lump, unspecified upper limb: Secondary | ICD-10-CM | POA: Diagnosis not present

## 2018-06-15 DIAGNOSIS — S98911S Complete traumatic amputation of right foot, level unspecified, sequela: Secondary | ICD-10-CM | POA: Diagnosis not present

## 2018-06-15 DIAGNOSIS — E1122 Type 2 diabetes mellitus with diabetic chronic kidney disease: Secondary | ICD-10-CM | POA: Diagnosis not present

## 2018-06-16 DIAGNOSIS — D631 Anemia in chronic kidney disease: Secondary | ICD-10-CM | POA: Diagnosis not present

## 2018-06-16 DIAGNOSIS — J302 Other seasonal allergic rhinitis: Secondary | ICD-10-CM | POA: Diagnosis not present

## 2018-06-16 DIAGNOSIS — S98911S Complete traumatic amputation of right foot, level unspecified, sequela: Secondary | ICD-10-CM | POA: Diagnosis not present

## 2018-06-16 DIAGNOSIS — N2581 Secondary hyperparathyroidism of renal origin: Secondary | ICD-10-CM | POA: Diagnosis not present

## 2018-06-16 DIAGNOSIS — D509 Iron deficiency anemia, unspecified: Secondary | ICD-10-CM | POA: Diagnosis not present

## 2018-06-16 DIAGNOSIS — N186 End stage renal disease: Secondary | ICD-10-CM | POA: Diagnosis not present

## 2018-06-16 DIAGNOSIS — E1122 Type 2 diabetes mellitus with diabetic chronic kidney disease: Secondary | ICD-10-CM | POA: Diagnosis not present

## 2018-06-19 DIAGNOSIS — N186 End stage renal disease: Secondary | ICD-10-CM | POA: Diagnosis not present

## 2018-06-19 DIAGNOSIS — D631 Anemia in chronic kidney disease: Secondary | ICD-10-CM | POA: Diagnosis not present

## 2018-06-19 DIAGNOSIS — N2581 Secondary hyperparathyroidism of renal origin: Secondary | ICD-10-CM | POA: Diagnosis not present

## 2018-06-19 DIAGNOSIS — D509 Iron deficiency anemia, unspecified: Secondary | ICD-10-CM | POA: Diagnosis not present

## 2018-06-20 ENCOUNTER — Other Ambulatory Visit (HOSPITAL_COMMUNITY): Payer: Medicare Other

## 2018-06-21 ENCOUNTER — Telehealth (INDEPENDENT_AMBULATORY_CARE_PROVIDER_SITE_OTHER): Payer: Self-pay

## 2018-06-21 DIAGNOSIS — Z992 Dependence on renal dialysis: Secondary | ICD-10-CM | POA: Diagnosis not present

## 2018-06-21 DIAGNOSIS — N2581 Secondary hyperparathyroidism of renal origin: Secondary | ICD-10-CM | POA: Diagnosis not present

## 2018-06-21 DIAGNOSIS — N186 End stage renal disease: Secondary | ICD-10-CM | POA: Diagnosis not present

## 2018-06-21 DIAGNOSIS — E1129 Type 2 diabetes mellitus with other diabetic kidney complication: Secondary | ICD-10-CM | POA: Diagnosis not present

## 2018-06-21 DIAGNOSIS — D631 Anemia in chronic kidney disease: Secondary | ICD-10-CM | POA: Diagnosis not present

## 2018-06-21 DIAGNOSIS — D509 Iron deficiency anemia, unspecified: Secondary | ICD-10-CM | POA: Diagnosis not present

## 2018-06-21 NOTE — Telephone Encounter (Signed)
Called and lm on vm for pt to ask prescreen COVID-19 prior to appt tomorrow with Dr. Sharol Given. Asked pt to call back.

## 2018-06-22 ENCOUNTER — Ambulatory Visit (INDEPENDENT_AMBULATORY_CARE_PROVIDER_SITE_OTHER): Payer: Medicare Other | Admitting: Orthopedic Surgery

## 2018-06-22 DIAGNOSIS — L03116 Cellulitis of left lower limb: Secondary | ICD-10-CM | POA: Diagnosis not present

## 2018-06-22 DIAGNOSIS — Z7409 Other reduced mobility: Secondary | ICD-10-CM | POA: Diagnosis not present

## 2018-06-22 DIAGNOSIS — M6281 Muscle weakness (generalized): Secondary | ICD-10-CM | POA: Diagnosis not present

## 2018-06-23 DIAGNOSIS — N2581 Secondary hyperparathyroidism of renal origin: Secondary | ICD-10-CM | POA: Diagnosis not present

## 2018-06-23 DIAGNOSIS — E1129 Type 2 diabetes mellitus with other diabetic kidney complication: Secondary | ICD-10-CM | POA: Diagnosis not present

## 2018-06-23 DIAGNOSIS — D631 Anemia in chronic kidney disease: Secondary | ICD-10-CM | POA: Diagnosis not present

## 2018-06-23 DIAGNOSIS — N186 End stage renal disease: Secondary | ICD-10-CM | POA: Diagnosis not present

## 2018-06-23 DIAGNOSIS — D509 Iron deficiency anemia, unspecified: Secondary | ICD-10-CM | POA: Diagnosis not present

## 2018-06-26 DIAGNOSIS — D509 Iron deficiency anemia, unspecified: Secondary | ICD-10-CM | POA: Diagnosis not present

## 2018-06-26 DIAGNOSIS — N2581 Secondary hyperparathyroidism of renal origin: Secondary | ICD-10-CM | POA: Diagnosis not present

## 2018-06-26 DIAGNOSIS — D631 Anemia in chronic kidney disease: Secondary | ICD-10-CM | POA: Diagnosis not present

## 2018-06-26 DIAGNOSIS — E1129 Type 2 diabetes mellitus with other diabetic kidney complication: Secondary | ICD-10-CM | POA: Diagnosis not present

## 2018-06-26 DIAGNOSIS — N186 End stage renal disease: Secondary | ICD-10-CM | POA: Diagnosis not present

## 2018-06-28 DIAGNOSIS — D509 Iron deficiency anemia, unspecified: Secondary | ICD-10-CM | POA: Diagnosis not present

## 2018-06-28 DIAGNOSIS — E1129 Type 2 diabetes mellitus with other diabetic kidney complication: Secondary | ICD-10-CM | POA: Diagnosis not present

## 2018-06-28 DIAGNOSIS — N2581 Secondary hyperparathyroidism of renal origin: Secondary | ICD-10-CM | POA: Diagnosis not present

## 2018-06-28 DIAGNOSIS — M6281 Muscle weakness (generalized): Secondary | ICD-10-CM | POA: Diagnosis not present

## 2018-06-28 DIAGNOSIS — N186 End stage renal disease: Secondary | ICD-10-CM | POA: Diagnosis not present

## 2018-06-28 DIAGNOSIS — D631 Anemia in chronic kidney disease: Secondary | ICD-10-CM | POA: Diagnosis not present

## 2018-06-28 DIAGNOSIS — L03116 Cellulitis of left lower limb: Secondary | ICD-10-CM | POA: Diagnosis not present

## 2018-06-28 DIAGNOSIS — Z7409 Other reduced mobility: Secondary | ICD-10-CM | POA: Diagnosis not present

## 2018-06-30 DIAGNOSIS — E1129 Type 2 diabetes mellitus with other diabetic kidney complication: Secondary | ICD-10-CM | POA: Diagnosis not present

## 2018-06-30 DIAGNOSIS — D509 Iron deficiency anemia, unspecified: Secondary | ICD-10-CM | POA: Diagnosis not present

## 2018-06-30 DIAGNOSIS — D631 Anemia in chronic kidney disease: Secondary | ICD-10-CM | POA: Diagnosis not present

## 2018-06-30 DIAGNOSIS — N186 End stage renal disease: Secondary | ICD-10-CM | POA: Diagnosis not present

## 2018-06-30 DIAGNOSIS — N2581 Secondary hyperparathyroidism of renal origin: Secondary | ICD-10-CM | POA: Diagnosis not present

## 2018-07-03 DIAGNOSIS — N186 End stage renal disease: Secondary | ICD-10-CM | POA: Diagnosis not present

## 2018-07-03 DIAGNOSIS — D631 Anemia in chronic kidney disease: Secondary | ICD-10-CM | POA: Diagnosis not present

## 2018-07-03 DIAGNOSIS — N2581 Secondary hyperparathyroidism of renal origin: Secondary | ICD-10-CM | POA: Diagnosis not present

## 2018-07-03 DIAGNOSIS — D509 Iron deficiency anemia, unspecified: Secondary | ICD-10-CM | POA: Diagnosis not present

## 2018-07-03 DIAGNOSIS — E1129 Type 2 diabetes mellitus with other diabetic kidney complication: Secondary | ICD-10-CM | POA: Diagnosis not present

## 2018-07-05 DIAGNOSIS — Z7409 Other reduced mobility: Secondary | ICD-10-CM | POA: Diagnosis not present

## 2018-07-05 DIAGNOSIS — M6281 Muscle weakness (generalized): Secondary | ICD-10-CM | POA: Diagnosis not present

## 2018-07-05 DIAGNOSIS — L03116 Cellulitis of left lower limb: Secondary | ICD-10-CM | POA: Diagnosis not present

## 2018-07-07 DIAGNOSIS — N186 End stage renal disease: Secondary | ICD-10-CM | POA: Diagnosis not present

## 2018-07-07 DIAGNOSIS — D509 Iron deficiency anemia, unspecified: Secondary | ICD-10-CM | POA: Diagnosis not present

## 2018-07-07 DIAGNOSIS — D631 Anemia in chronic kidney disease: Secondary | ICD-10-CM | POA: Diagnosis not present

## 2018-07-07 DIAGNOSIS — E1129 Type 2 diabetes mellitus with other diabetic kidney complication: Secondary | ICD-10-CM | POA: Diagnosis not present

## 2018-07-07 DIAGNOSIS — N2581 Secondary hyperparathyroidism of renal origin: Secondary | ICD-10-CM | POA: Diagnosis not present

## 2018-07-10 DIAGNOSIS — N186 End stage renal disease: Secondary | ICD-10-CM | POA: Diagnosis not present

## 2018-07-10 DIAGNOSIS — D631 Anemia in chronic kidney disease: Secondary | ICD-10-CM | POA: Diagnosis not present

## 2018-07-10 DIAGNOSIS — E1129 Type 2 diabetes mellitus with other diabetic kidney complication: Secondary | ICD-10-CM | POA: Diagnosis not present

## 2018-07-10 DIAGNOSIS — N2581 Secondary hyperparathyroidism of renal origin: Secondary | ICD-10-CM | POA: Diagnosis not present

## 2018-07-10 DIAGNOSIS — D509 Iron deficiency anemia, unspecified: Secondary | ICD-10-CM | POA: Diagnosis not present

## 2018-07-11 ENCOUNTER — Telehealth: Payer: Self-pay | Admitting: Cardiovascular Disease

## 2018-07-11 NOTE — Telephone Encounter (Signed)
   Primary Cardiologist:  No primary care provider on file.     Patient  Called, no answer  .  History reviewed.    I have reviewed chart.   The timing of the scheduled echocardiogramWappears to be elective.  with current pandemic situation, we will be postponing this appointment for Howard Bell with a plan for f/u in 6-8  wks or sooner if feasible/necessary.  I have left a voice mail on his phone that his procedure is cancelled and have given him our number to call if he has any questions.    If symptoms change, he has been instructed to contact our office.     Priorty level 2   Routing to C19 CANCEL pool for tracking (P CV DIV CV19 CANCEL - reason for visit "other.") and assigning   priority level  2 = 6-12 weeks     Mertie Moores, MD  07/11/2018 2:37 PM         .

## 2018-07-12 DIAGNOSIS — N2581 Secondary hyperparathyroidism of renal origin: Secondary | ICD-10-CM | POA: Diagnosis not present

## 2018-07-12 DIAGNOSIS — E1129 Type 2 diabetes mellitus with other diabetic kidney complication: Secondary | ICD-10-CM | POA: Diagnosis not present

## 2018-07-12 DIAGNOSIS — Z7409 Other reduced mobility: Secondary | ICD-10-CM | POA: Diagnosis not present

## 2018-07-12 DIAGNOSIS — D631 Anemia in chronic kidney disease: Secondary | ICD-10-CM | POA: Diagnosis not present

## 2018-07-12 DIAGNOSIS — D509 Iron deficiency anemia, unspecified: Secondary | ICD-10-CM | POA: Diagnosis not present

## 2018-07-12 DIAGNOSIS — L03116 Cellulitis of left lower limb: Secondary | ICD-10-CM | POA: Diagnosis not present

## 2018-07-12 DIAGNOSIS — M6281 Muscle weakness (generalized): Secondary | ICD-10-CM | POA: Diagnosis not present

## 2018-07-12 DIAGNOSIS — N186 End stage renal disease: Secondary | ICD-10-CM | POA: Diagnosis not present

## 2018-07-13 DIAGNOSIS — J302 Other seasonal allergic rhinitis: Secondary | ICD-10-CM | POA: Diagnosis not present

## 2018-07-13 DIAGNOSIS — E1122 Type 2 diabetes mellitus with diabetic chronic kidney disease: Secondary | ICD-10-CM | POA: Diagnosis not present

## 2018-07-13 DIAGNOSIS — N186 End stage renal disease: Secondary | ICD-10-CM | POA: Diagnosis not present

## 2018-07-13 DIAGNOSIS — E1129 Type 2 diabetes mellitus with other diabetic kidney complication: Secondary | ICD-10-CM | POA: Diagnosis not present

## 2018-07-14 DIAGNOSIS — D631 Anemia in chronic kidney disease: Secondary | ICD-10-CM | POA: Diagnosis not present

## 2018-07-14 DIAGNOSIS — E1129 Type 2 diabetes mellitus with other diabetic kidney complication: Secondary | ICD-10-CM | POA: Diagnosis not present

## 2018-07-14 DIAGNOSIS — N2581 Secondary hyperparathyroidism of renal origin: Secondary | ICD-10-CM | POA: Diagnosis not present

## 2018-07-14 DIAGNOSIS — N186 End stage renal disease: Secondary | ICD-10-CM | POA: Diagnosis not present

## 2018-07-14 DIAGNOSIS — D509 Iron deficiency anemia, unspecified: Secondary | ICD-10-CM | POA: Diagnosis not present

## 2018-07-17 DIAGNOSIS — N186 End stage renal disease: Secondary | ICD-10-CM | POA: Diagnosis not present

## 2018-07-17 DIAGNOSIS — D509 Iron deficiency anemia, unspecified: Secondary | ICD-10-CM | POA: Diagnosis not present

## 2018-07-17 DIAGNOSIS — N2581 Secondary hyperparathyroidism of renal origin: Secondary | ICD-10-CM | POA: Diagnosis not present

## 2018-07-17 DIAGNOSIS — D631 Anemia in chronic kidney disease: Secondary | ICD-10-CM | POA: Diagnosis not present

## 2018-07-17 DIAGNOSIS — E1129 Type 2 diabetes mellitus with other diabetic kidney complication: Secondary | ICD-10-CM | POA: Diagnosis not present

## 2018-07-18 ENCOUNTER — Other Ambulatory Visit (HOSPITAL_COMMUNITY): Payer: Medicare Other

## 2018-07-19 DIAGNOSIS — E1129 Type 2 diabetes mellitus with other diabetic kidney complication: Secondary | ICD-10-CM | POA: Diagnosis not present

## 2018-07-19 DIAGNOSIS — N2581 Secondary hyperparathyroidism of renal origin: Secondary | ICD-10-CM | POA: Diagnosis not present

## 2018-07-19 DIAGNOSIS — D509 Iron deficiency anemia, unspecified: Secondary | ICD-10-CM | POA: Diagnosis not present

## 2018-07-19 DIAGNOSIS — D631 Anemia in chronic kidney disease: Secondary | ICD-10-CM | POA: Diagnosis not present

## 2018-07-19 DIAGNOSIS — N186 End stage renal disease: Secondary | ICD-10-CM | POA: Diagnosis not present

## 2018-07-21 DIAGNOSIS — S98911S Complete traumatic amputation of right foot, level unspecified, sequela: Secondary | ICD-10-CM | POA: Diagnosis not present

## 2018-07-21 DIAGNOSIS — Z992 Dependence on renal dialysis: Secondary | ICD-10-CM | POA: Diagnosis not present

## 2018-07-21 DIAGNOSIS — E1129 Type 2 diabetes mellitus with other diabetic kidney complication: Secondary | ICD-10-CM | POA: Diagnosis not present

## 2018-07-21 DIAGNOSIS — D631 Anemia in chronic kidney disease: Secondary | ICD-10-CM | POA: Diagnosis not present

## 2018-07-21 DIAGNOSIS — D509 Iron deficiency anemia, unspecified: Secondary | ICD-10-CM | POA: Diagnosis not present

## 2018-07-21 DIAGNOSIS — N2581 Secondary hyperparathyroidism of renal origin: Secondary | ICD-10-CM | POA: Diagnosis not present

## 2018-07-21 DIAGNOSIS — J302 Other seasonal allergic rhinitis: Secondary | ICD-10-CM | POA: Diagnosis not present

## 2018-07-21 DIAGNOSIS — N186 End stage renal disease: Secondary | ICD-10-CM | POA: Diagnosis not present

## 2018-07-24 DIAGNOSIS — N2581 Secondary hyperparathyroidism of renal origin: Secondary | ICD-10-CM | POA: Diagnosis not present

## 2018-07-24 DIAGNOSIS — E1129 Type 2 diabetes mellitus with other diabetic kidney complication: Secondary | ICD-10-CM | POA: Diagnosis not present

## 2018-07-24 DIAGNOSIS — D631 Anemia in chronic kidney disease: Secondary | ICD-10-CM | POA: Diagnosis not present

## 2018-07-24 DIAGNOSIS — D509 Iron deficiency anemia, unspecified: Secondary | ICD-10-CM | POA: Diagnosis not present

## 2018-07-24 DIAGNOSIS — N186 End stage renal disease: Secondary | ICD-10-CM | POA: Diagnosis not present

## 2018-07-25 ENCOUNTER — Telehealth: Payer: Self-pay | Admitting: Cardiology

## 2018-07-25 DIAGNOSIS — N186 End stage renal disease: Secondary | ICD-10-CM | POA: Diagnosis not present

## 2018-07-25 DIAGNOSIS — I871 Compression of vein: Secondary | ICD-10-CM | POA: Diagnosis not present

## 2018-07-25 DIAGNOSIS — Z992 Dependence on renal dialysis: Secondary | ICD-10-CM | POA: Diagnosis not present

## 2018-07-25 NOTE — Telephone Encounter (Signed)
LVM for patient to schedule appt.

## 2018-07-26 ENCOUNTER — Telehealth: Payer: Self-pay

## 2018-07-26 DIAGNOSIS — L03116 Cellulitis of left lower limb: Secondary | ICD-10-CM | POA: Diagnosis not present

## 2018-07-26 DIAGNOSIS — D631 Anemia in chronic kidney disease: Secondary | ICD-10-CM | POA: Diagnosis not present

## 2018-07-26 DIAGNOSIS — M6281 Muscle weakness (generalized): Secondary | ICD-10-CM | POA: Diagnosis not present

## 2018-07-26 DIAGNOSIS — D509 Iron deficiency anemia, unspecified: Secondary | ICD-10-CM | POA: Diagnosis not present

## 2018-07-26 DIAGNOSIS — E1129 Type 2 diabetes mellitus with other diabetic kidney complication: Secondary | ICD-10-CM | POA: Diagnosis not present

## 2018-07-26 DIAGNOSIS — N186 End stage renal disease: Secondary | ICD-10-CM | POA: Diagnosis not present

## 2018-07-26 DIAGNOSIS — Z7409 Other reduced mobility: Secondary | ICD-10-CM | POA: Diagnosis not present

## 2018-07-26 DIAGNOSIS — N2581 Secondary hyperparathyroidism of renal origin: Secondary | ICD-10-CM | POA: Diagnosis not present

## 2018-07-26 NOTE — Telephone Encounter (Signed)
Called to advise this pt was a BKA 05/14/18 has only had follow up 05/30/18 facility has been on lock down and he needs follow up in office. Per nurse the pt still has staples they have been treating BKA with Dakins solution and betadine for some eschar at the incision. He also has developed cellulitis of the LLE which she states they are treating with santyl and silver alginate. Advised that we need to have post op follow up and he will come in 08/01/18 at 2pm

## 2018-07-28 DIAGNOSIS — D631 Anemia in chronic kidney disease: Secondary | ICD-10-CM | POA: Diagnosis not present

## 2018-07-28 DIAGNOSIS — N186 End stage renal disease: Secondary | ICD-10-CM | POA: Diagnosis not present

## 2018-07-28 DIAGNOSIS — D509 Iron deficiency anemia, unspecified: Secondary | ICD-10-CM | POA: Diagnosis not present

## 2018-07-28 DIAGNOSIS — N2581 Secondary hyperparathyroidism of renal origin: Secondary | ICD-10-CM | POA: Diagnosis not present

## 2018-07-28 DIAGNOSIS — E1129 Type 2 diabetes mellitus with other diabetic kidney complication: Secondary | ICD-10-CM | POA: Diagnosis not present

## 2018-07-31 DIAGNOSIS — E1129 Type 2 diabetes mellitus with other diabetic kidney complication: Secondary | ICD-10-CM | POA: Diagnosis not present

## 2018-07-31 DIAGNOSIS — D631 Anemia in chronic kidney disease: Secondary | ICD-10-CM | POA: Diagnosis not present

## 2018-07-31 DIAGNOSIS — D509 Iron deficiency anemia, unspecified: Secondary | ICD-10-CM | POA: Diagnosis not present

## 2018-07-31 DIAGNOSIS — N186 End stage renal disease: Secondary | ICD-10-CM | POA: Diagnosis not present

## 2018-07-31 DIAGNOSIS — N2581 Secondary hyperparathyroidism of renal origin: Secondary | ICD-10-CM | POA: Diagnosis not present

## 2018-08-01 ENCOUNTER — Encounter: Payer: Self-pay | Admitting: Orthopedic Surgery

## 2018-08-01 ENCOUNTER — Ambulatory Visit: Payer: Medicare Other | Admitting: Orthopedic Surgery

## 2018-08-01 ENCOUNTER — Other Ambulatory Visit: Payer: Self-pay

## 2018-08-01 ENCOUNTER — Ambulatory Visit (INDEPENDENT_AMBULATORY_CARE_PROVIDER_SITE_OTHER): Payer: Medicare Other | Admitting: Orthopedic Surgery

## 2018-08-01 VITALS — Ht 72.0 in | Wt 201.0 lb

## 2018-08-01 DIAGNOSIS — T8781 Dehiscence of amputation stump: Secondary | ICD-10-CM | POA: Insufficient documentation

## 2018-08-01 DIAGNOSIS — E43 Unspecified severe protein-calorie malnutrition: Secondary | ICD-10-CM

## 2018-08-01 DIAGNOSIS — L02612 Cutaneous abscess of left foot: Secondary | ICD-10-CM | POA: Insufficient documentation

## 2018-08-01 NOTE — Progress Notes (Signed)
Office Visit Note   Patient: Howard Bell           Date of Birth: 09/07/1965           MRN: [id number] Visit Date: 08/01/2018              Requested by: Horald Pollen, MD [place], [place] [address] PCP: Horald Pollen, MD  Chief Complaint  Patient presents with   Right Leg - Routine Post Op    05/14/18 right BKA. First visit since 05/30/18. Pt at SNF. No shrinker, staples still present.       HPI: Patient is a 53 year old gentleman who is seen in follow-up for a right transtibial amputation.  Patient is currently at [facility] and has not followed up since surgery.  Patient is 3 months out from surgery.  Patient is also developed new ischemic ulceration to the posterior aspect of the left ankle as well as a superficial abscess of the dorsal lateral aspect the left foot.  Assessment & Plan: Visit Diagnoses:  1. Dehiscence of amputation stump ([place])   2. Cutaneous abscess of left foot     Plan: Patient was given a prescription for doxycycline as well as for Silvadene.  Instructions from the nursing home to provide daily dressing changes to the left ankle and left foot with Dial soap cleansing and Silvadene dressing changes.  Patient is to take the doxycycline twice a day.  Due to the abscess and gangrenous changes of the right transtibial amputation patient will need to proceed with a above-the-knee amputation on the right we will set this up for Friday.  Follow-Up Instructions: Return in about 2 weeks (around 08/15/2018).   Ortho Exam  Patient is alert, oriented, no adenopathy, well-dressed, normal affect, normal respiratory effort. Examination patient's left foot he has ischemic skin changes over the posterior aspect of the Achilles.  There is also a very small superficial abscess over the dorsum of the fourth and fifth metatarsal heads.  This was unroofed the abscess was decompressed.  A Doppler was used and patient has a strong biphasic dorsalis  pedis pulse and a biphasic posterior tibial pulse on the left.  Examination of the right transtibial amputation shows gangrenous changes full-thickness of almost the entire end of the residual limb is also ischemic gangrenous changes up to the tibial tubercle.  The eschar was unroofed and there was an abscess as well as necrotic muscle and soft tissue.  Patient does not have sufficient viable tissue to revise the transtibial amputation.  Patient has uncontrolled type 2 diabetes with severe protein caloric malnutrition.  Imaging: No results found.   Labs: Lab Results  Component Value Date   HGBA1C 8.3 (H) 05/12/2018   HGBA1C 8.5 (A) 04/01/2018   HGBA1C 8.5 01/20/2017   REPTSTATUS 05/15/2018 FINAL 05/12/2018   CULT PROTEUS MIRABILIS (A) 05/12/2018   LABORGA PROTEUS MIRABILIS 05/12/2018     Lab Results  Component Value Date   ALBUMIN 2.5 (L) 05/15/2018   ALBUMIN 2.3 (L) 05/13/2018   ALBUMIN 2.7 (L) 05/12/2018    Body mass index is 27.26 kg/m.  Orders:  No orders of the defined types were placed in this encounter.  No orders of the defined types were placed in this encounter.    Procedures: No procedures performed  Clinical Data: No additional findings.  ROS:  All other systems negative, except as noted in the HPI. Review of Systems  Objective: Vital Signs: Ht 6' (1.829 m)  Wt 201 lb (91.2 kg)    BMI 27.26 kg/m   Specialty Comments:  No specialty comments available.  PMFS History: Patient Active Problem List   Diagnosis Date Noted   Cellulitis of right lower extremity    Influenza A 05/12/2018   Pneumonia 05/12/2018   Gangrene of right foot ([place])    Critical limb ischemia with history of revascularization of same extremity 05/09/2018   LUQ pain    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 ([place]) 11/16/2017   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 ([place]) 11/16/2017   CKD stage  5 due to type 2 diabetes mellitus ([place]) 11/16/2017   Glaucoma due to type 2 diabetes mellitus ([place]) 11/16/2017   Chronic generalized pain 11/16/2017   Recurrent pneumonia 11/06/2017   Diabetic gastroparesis ([place]) 11/06/2017   Generalized abdominal pain 09/13/2017   Acute pain of right shoulder 07/26/2017   Acute bursitis of right shoulder 07/26/2017   Left arm swelling 03/28/2017   ESRD on dialysis ([place]) 03/28/2017   Chest pain 11/26/2016   Essential hypertension 11/26/2016   Anemia due to end stage renal disease ([place]) 08/04/2016   Acute respiratory failure with hypoxemia ([place]) 08/04/2016   Type 2 diabetes mellitus with foot ulcer ([place]) 08/04/2016   Acute CHF (congestive heart failure) ([place]) 08/04/2016   Elevated troponin    Past Medical History:  Diagnosis Date   Congestive heart failure (CHF) ([place])    Diabetes mellitus without complication ([place])    insulin dependent   Diabetic gastroparesis ([place]) 11/06/2017   ESRD (end stage renal disease) on dialysis [facility])    Hypertension     Family History  Problem Relation Age of Onset   Diabetes Mother    Hypertension Father     Past Surgical History:  Procedure Laterality Date   AMPUTATION Right 05/14/2018   Procedure: AMPUTATION BELOW KNEE;  Surgeon: Newt Minion, MD;  Location: [facility];  Service: Orthopedics;  Laterality: Right;   ESOPHAGOGASTRODUODENOSCOPY (EGD) WITH PROPOFOL N/A 12/05/2017   Procedure: ESOPHAGOGASTRODUODENOSCOPY (EGD) WITH PROPOFOL;  Surgeon: Doran Stabler, MD;  [facility];  Service: Gastroenterology;  Laterality: N/A;   HERNIA REPAIR     IR AV DIALY SHUNT INTRO NEEDLE/INTRACATH INITIAL W/PTA/IMG LEFT  03/30/2017   Social History   Occupational History   Not on file  Tobacco Use   Smoking status: Never Smoker   Smokeless tobacco: Never Used  Substance and Sexual Activity   Alcohol use: No   Drug use: No   Sexual activity: Not on file

## 2018-08-02 ENCOUNTER — Other Ambulatory Visit (HOSPITAL_COMMUNITY): Payer: Medicare Other

## 2018-08-02 DIAGNOSIS — N2581 Secondary hyperparathyroidism of renal origin: Secondary | ICD-10-CM | POA: Diagnosis not present

## 2018-08-02 DIAGNOSIS — E1129 Type 2 diabetes mellitus with other diabetic kidney complication: Secondary | ICD-10-CM | POA: Diagnosis not present

## 2018-08-02 DIAGNOSIS — D509 Iron deficiency anemia, unspecified: Secondary | ICD-10-CM | POA: Diagnosis not present

## 2018-08-02 DIAGNOSIS — D631 Anemia in chronic kidney disease: Secondary | ICD-10-CM | POA: Diagnosis not present

## 2018-08-02 DIAGNOSIS — N186 End stage renal disease: Secondary | ICD-10-CM | POA: Diagnosis not present

## 2018-08-03 ENCOUNTER — Other Ambulatory Visit: Payer: Self-pay

## 2018-08-03 ENCOUNTER — Telehealth: Payer: Self-pay | Admitting: Orthopedic Surgery

## 2018-08-03 ENCOUNTER — Encounter (HOSPITAL_COMMUNITY): Payer: Self-pay | Admitting: *Deleted

## 2018-08-03 NOTE — Progress Notes (Addendum)
I spoke with Elmyra Ricks, patient's nurse at [facility]  Elmyra Ricks reports that patient has experienced chest pain or shortness of breath.  Mr Howard Bell wears 2 l oxygen 24 hours a day.  Elmyra Ricks reports that patient has Sleep apnea, but refused to wear CPAP.  Elmyra Ricks reports that CBGs run in the 100s unless he eats things from the vending machine. This am CBG was 228, he had eaten food from the vending machine. Elmyra Ricks reports that orders from Dr. Jess Barters office is for NPO after midnight.  Elmyra Ricks is faxing last office note from [place], from [facility].

## 2018-08-03 NOTE — Telephone Encounter (Signed)
Patient wife Kenney Houseman called and stated that spouse is having surgery (have leg amputated)she is requesting a note on letterhead stating that spouse is an amputee and they are trying to move downstairs in their apartment complex because patient will not be able to get upstairs to the apartment that they live now.  The complex is willing to let them move to a downstairs apartment if she can present a letter to them of why he cant get upstairs.  Please call spouse @ [phone]

## 2018-08-03 NOTE — Pre-Procedure Instructions (Signed)
    Eluterio Seymour  08/03/2018     Your procedure is scheduled on Friday, May 15..  Report to [facility], Main Entrance or Entrance "A" at 5:30 A.M.  Call this number if you have problems the morning of surgery: [phone]  This is the number for the Pre- Surgical Desk.    Remember:  Do not eat or drink after midnight. >>>>>Please send patient's Medication Record with medications administrated documentation. ( this information is required prior to OR. This includes medications that may have been on hold for surgery)<<<<<   Take these medicines the morning of surgery with A SIP OF WATER : If needed: Oxycodone     o Check  blood sugar the morning of your surgery when you wake up.  . If your blood sugar is less than 70 mg/dL, you will need to treat for low blood sugar: .  o Treat a low blood sugar (less than 70 mg/dL) with  (4 glucose tablets, OR glucose gel. Recheck blood sugar in 15 minutes after treatment (to make sure it is greater than 70 mg/dL). . Report your blood sugar to the short stay nurse when you get to Short Stay.  . If  CBG is greater than 220 mg/dL,  give  of your sliding scale (correction) dose of insulin.               Shower wear clean clothes, brush teeth.  Do not wear jewelry, make-up or nail polish.  Do not wear lotions, powders, or perfumes, or deodorant.   Men may shave face and neck.  Do not bring valuables to the hospital.  [facility] is not responsible for any belongings or valuables.

## 2018-08-03 NOTE — Anesthesia Preprocedure Evaluation (Addendum)
Anesthesia Evaluation  Patient identified by MRN, date of birth, ID band Patient awake    Reviewed: Allergy & Precautions, NPO status , Patient's Chart, lab work & pertinent test results, reviewed documented beta blocker date and time   Airway Mallampati: III  TM Distance: >3 FB     Dental no notable dental hx. (+) Teeth Intact   Pulmonary sleep apnea and Continuous Positive Airway Pressure Ventilation , pneumonia, resolved,  Hx/o frequent aspiration   Pulmonary exam normal breath sounds clear to auscultation       Cardiovascular hypertension, Pt. on home beta blockers and Pt. on medications + Peripheral Vascular Disease and +CHF  Normal cardiovascular exam Rhythm:Regular Rate:Normal     Neuro/Psych PSYCHIATRIC DISORDERS Depression    GI/Hepatic GERD  Medicated and Controlled,Hx/o gastroparesis   Endo/Other  diabetes, Poorly Controlled, Type 2, Oral Hypoglycemic AgentsHyperlipidemia  Renal/GU CRFRenal disease  negative genitourinary   Musculoskeletal  (+) Arthritis , Osteoarthritis,  Gangrene Right BKA   Abdominal   Peds  Hematology  (+) anemia ,   Anesthesia Other Findings   Reproductive/Obstetrics                            Anesthesia Physical Anesthesia Plan  ASA: IV  Anesthesia Plan: MAC and Regional   Post-op Pain Management:    Induction:   PONV Risk Score and Plan: 2 and Ondansetron, Treatment may vary due to age or medical condition, Propofol infusion and Midazolam  Airway Management Planned: Natural Airway, Nasal Cannula and Simple Face Mask  Additional Equipment:   Intra-op Plan:   Post-operative Plan:   Informed Consent: I have reviewed the patients History and Physical, chart, labs and discussed the procedure including the risks, benefits and alternatives for the proposed anesthesia with the patient or authorized representative who has indicated his/her  understanding and acceptance.     Dental advisory given  Plan Discussed with: CRNA and Surgeon  Anesthesia Plan Comments: (Hx of gastroparesis with repeat bouts of aspiration PNA.  Follows with Dr. Elsworth Soho for chronic respiratory failure. On 2L O2.  History of HFrEF. Seen by Dr. Gwenlyn Found 05/09/18. Per his note "History of congestive heart failure with unknown EF.  Will check a 2D echo to further evaluate." Echo was subsequently postponed due to COVID restrictions.   Last known EF 26% per TTE performed at [facility] 01/29/2014. Full results from care everywhere: Conclusion:  There is no pericardial effusion.PASP of 39 mmHg.Dilated RV at 4.51 cm, and the right ventricular systolic function is mildly reduced: TAPSE of 1.63 cm. The left ventricle is upper limit of normal in size, the left ventricular mass index is increased, diastolic dysfunction consistent with elevated left atrial pressure, and reduced global LVEF of 26%.  Discussed with Dr. Lissa Hoard. Ok to proceed due to nature of procedure. Will need DOS eval. )                                     Anesthesia Evaluation  Patient identified by MRN, date of birth, ID band Patient awake    Reviewed: Allergy & Precautions, NPO status , Patient's Chart, lab work & pertinent test results  Airway Mallampati: III  TM Distance: >3 FB Neck ROM: Full    Dental no notable dental hx.    Pulmonary sleep apnea and Continuous Positive Airway Pressure Ventilation ,    Pulmonary exam  normal breath sounds clear to auscultation       Cardiovascular hypertension, Pt. on medications and Pt. on home beta blockers +CHF  Normal cardiovascular exam Rhythm:Regular Rate:Normal  ECG: rate 100. Sinus tachycardia Left anterior fascicular block   Neuro/Psych negative neurological ROS  negative psych ROS   GI/Hepatic negative GI ROS, Neg liver ROS,   Endo/Other  diabetes, Insulin Dependent  Renal/GU ESRF and DialysisRenal diseaseOn HD  M, W, F     Musculoskeletal negative musculoskeletal ROS (+)   Abdominal   Peds  Hematology  (+) anemia ,   Anesthesia Other Findings GANGRENE  Reproductive/Obstetrics                            Anesthesia Physical Anesthesia Plan  ASA: IV  Anesthesia Plan: Regional and MAC   Post-op Pain Management:  Regional for Post-op pain   Induction: Intravenous  PONV Risk Score and Plan: 1 and Ondansetron, Treatment may vary due to age or medical condition and Propofol infusion  Airway Management Planned: Simple Face Mask  Additional Equipment:   Intra-op Plan:   Post-operative Plan:   Informed Consent: I have reviewed the patients History and Physical, chart, labs and discussed the procedure including the risks, benefits and alternatives for the proposed anesthesia with the patient or authorized representative who has indicated his/her understanding and acceptance.     Dental advisory given  Plan Discussed with: CRNA and Surgeon  Anesthesia Plan Comments:        Anesthesia Quick Evaluation  Anesthesia Quick Evaluation

## 2018-08-03 NOTE — Telephone Encounter (Signed)
Pt sch for a BKA revision tomorrow. I advised the pt's wife that letter has been written and is at the desk for pick up to allow for the pt to move into a downstairs apartment due to his surgery.

## 2018-08-04 ENCOUNTER — Encounter (HOSPITAL_COMMUNITY)
Admission: RE | Disposition: A | Discharge status: Rehabilitation Facility | Payer: Self-pay | Source: Skilled Nursing Facility | Attending: Orthopedic Surgery

## 2018-08-04 ENCOUNTER — Inpatient Hospital Stay (HOSPITAL_COMMUNITY): Payer: Medicare Other | Admitting: Physician Assistant

## 2018-08-04 ENCOUNTER — Encounter (HOSPITAL_COMMUNITY): Payer: Self-pay | Admitting: *Deleted

## 2018-08-04 ENCOUNTER — Other Ambulatory Visit: Payer: Self-pay

## 2018-08-04 ENCOUNTER — Inpatient Hospital Stay (HOSPITAL_COMMUNITY)
Admission: RE | Admit: 2018-08-04 | Discharge: 2018-08-07 | DRG: 474 | Disposition: A | Discharge status: Rehabilitation Facility | Payer: Medicare Other | Source: Skilled Nursing Facility | Attending: Orthopedic Surgery | Admitting: Orthopedic Surgery

## 2018-08-04 DIAGNOSIS — Z1159 Encounter for screening for other viral diseases: Secondary | ICD-10-CM | POA: Diagnosis not present

## 2018-08-04 DIAGNOSIS — F329 Major depressive disorder, single episode, unspecified: Secondary | ICD-10-CM | POA: Diagnosis not present

## 2018-08-04 DIAGNOSIS — I70202 Unspecified atherosclerosis of native arteries of extremities, left leg: Secondary | ICD-10-CM | POA: Diagnosis not present

## 2018-08-04 DIAGNOSIS — N2581 Secondary hyperparathyroidism of renal origin: Secondary | ICD-10-CM | POA: Diagnosis not present

## 2018-08-04 DIAGNOSIS — I739 Peripheral vascular disease, unspecified: Secondary | ICD-10-CM | POA: Diagnosis not present

## 2018-08-04 DIAGNOSIS — Z89611 Acquired absence of right leg above knee: Secondary | ICD-10-CM | POA: Diagnosis not present

## 2018-08-04 DIAGNOSIS — G4733 Obstructive sleep apnea (adult) (pediatric): Secondary | ICD-10-CM | POA: Diagnosis present

## 2018-08-04 DIAGNOSIS — E1122 Type 2 diabetes mellitus with diabetic chronic kidney disease: Secondary | ICD-10-CM | POA: Diagnosis not present

## 2018-08-04 DIAGNOSIS — E1152 Type 2 diabetes mellitus with diabetic peripheral angiopathy with gangrene: Secondary | ICD-10-CM | POA: Diagnosis not present

## 2018-08-04 DIAGNOSIS — G546 Phantom limb syndrome with pain: Secondary | ICD-10-CM | POA: Diagnosis present

## 2018-08-04 DIAGNOSIS — E1165 Type 2 diabetes mellitus with hyperglycemia: Secondary | ICD-10-CM | POA: Diagnosis not present

## 2018-08-04 DIAGNOSIS — E1142 Type 2 diabetes mellitus with diabetic polyneuropathy: Secondary | ICD-10-CM | POA: Diagnosis not present

## 2018-08-04 DIAGNOSIS — I70261 Atherosclerosis of native arteries of extremities with gangrene, right leg: Secondary | ICD-10-CM | POA: Diagnosis not present

## 2018-08-04 DIAGNOSIS — Z79891 Long term (current) use of opiate analgesic: Secondary | ICD-10-CM | POA: Diagnosis not present

## 2018-08-04 DIAGNOSIS — D638 Anemia in other chronic diseases classified elsewhere: Secondary | ICD-10-CM | POA: Diagnosis present

## 2018-08-04 DIAGNOSIS — N186 End stage renal disease: Secondary | ICD-10-CM | POA: Diagnosis not present

## 2018-08-04 DIAGNOSIS — E1169 Type 2 diabetes mellitus with other specified complication: Secondary | ICD-10-CM | POA: Diagnosis not present

## 2018-08-04 DIAGNOSIS — I96 Gangrene, not elsewhere classified: Secondary | ICD-10-CM | POA: Diagnosis not present

## 2018-08-04 DIAGNOSIS — R0902 Hypoxemia: Secondary | ICD-10-CM | POA: Diagnosis not present

## 2018-08-04 DIAGNOSIS — Z992 Dependence on renal dialysis: Secondary | ICD-10-CM

## 2018-08-04 DIAGNOSIS — E1143 Type 2 diabetes mellitus with diabetic autonomic (poly)neuropathy: Secondary | ICD-10-CM | POA: Diagnosis present

## 2018-08-04 DIAGNOSIS — K219 Gastro-esophageal reflux disease without esophagitis: Secondary | ICD-10-CM | POA: Diagnosis present

## 2018-08-04 DIAGNOSIS — L97919 Non-pressure chronic ulcer of unspecified part of right lower leg with unspecified severity: Secondary | ICD-10-CM | POA: Diagnosis not present

## 2018-08-04 DIAGNOSIS — A419 Sepsis, unspecified organism: Secondary | ICD-10-CM | POA: Diagnosis not present

## 2018-08-04 DIAGNOSIS — R7309 Other abnormal glucose: Secondary | ICD-10-CM | POA: Diagnosis not present

## 2018-08-04 DIAGNOSIS — T8781 Dehiscence of amputation stump: Principal | ICD-10-CM | POA: Diagnosis present

## 2018-08-04 DIAGNOSIS — S78111A Complete traumatic amputation at level between right hip and knee, initial encounter: Secondary | ICD-10-CM | POA: Diagnosis not present

## 2018-08-04 DIAGNOSIS — K3184 Gastroparesis: Secondary | ICD-10-CM | POA: Diagnosis present

## 2018-08-04 DIAGNOSIS — E162 Hypoglycemia, unspecified: Secondary | ICD-10-CM | POA: Diagnosis not present

## 2018-08-04 DIAGNOSIS — I132 Hypertensive heart and chronic kidney disease with heart failure and with stage 5 chronic kidney disease, or end stage renal disease: Secondary | ICD-10-CM | POA: Diagnosis not present

## 2018-08-04 DIAGNOSIS — S78112A Complete traumatic amputation at level between left hip and knee, initial encounter: Secondary | ICD-10-CM | POA: Diagnosis not present

## 2018-08-04 DIAGNOSIS — E119 Type 2 diabetes mellitus without complications: Secondary | ICD-10-CM | POA: Diagnosis not present

## 2018-08-04 DIAGNOSIS — Z8673 Personal history of transient ischemic attack (TIA), and cerebral infarction without residual deficits: Secondary | ICD-10-CM

## 2018-08-04 DIAGNOSIS — Z7951 Long term (current) use of inhaled steroids: Secondary | ICD-10-CM

## 2018-08-04 DIAGNOSIS — Z89612 Acquired absence of left leg above knee: Secondary | ICD-10-CM | POA: Diagnosis not present

## 2018-08-04 DIAGNOSIS — E1151 Type 2 diabetes mellitus with diabetic peripheral angiopathy without gangrene: Secondary | ICD-10-CM | POA: Diagnosis not present

## 2018-08-04 DIAGNOSIS — Z89511 Acquired absence of right leg below knee: Secondary | ICD-10-CM

## 2018-08-04 DIAGNOSIS — E669 Obesity, unspecified: Secondary | ICD-10-CM | POA: Diagnosis not present

## 2018-08-04 DIAGNOSIS — E11621 Type 2 diabetes mellitus with foot ulcer: Secondary | ICD-10-CM | POA: Diagnosis not present

## 2018-08-04 DIAGNOSIS — R0989 Other specified symptoms and signs involving the circulatory and respiratory systems: Secondary | ICD-10-CM | POA: Diagnosis not present

## 2018-08-04 DIAGNOSIS — H409 Unspecified glaucoma: Secondary | ICD-10-CM | POA: Diagnosis present

## 2018-08-04 DIAGNOSIS — I509 Heart failure, unspecified: Secondary | ICD-10-CM | POA: Diagnosis not present

## 2018-08-04 DIAGNOSIS — Z79899 Other long term (current) drug therapy: Secondary | ICD-10-CM

## 2018-08-04 DIAGNOSIS — D631 Anemia in chronic kidney disease: Secondary | ICD-10-CM | POA: Diagnosis not present

## 2018-08-04 DIAGNOSIS — L98499 Non-pressure chronic ulcer of skin of other sites with unspecified severity: Secondary | ICD-10-CM | POA: Diagnosis not present

## 2018-08-04 DIAGNOSIS — I120 Hypertensive chronic kidney disease with stage 5 chronic kidney disease or end stage renal disease: Secondary | ICD-10-CM | POA: Diagnosis not present

## 2018-08-04 DIAGNOSIS — I5022 Chronic systolic (congestive) heart failure: Secondary | ICD-10-CM | POA: Diagnosis not present

## 2018-08-04 DIAGNOSIS — R410 Disorientation, unspecified: Secondary | ICD-10-CM | POA: Diagnosis not present

## 2018-08-04 DIAGNOSIS — Z9981 Dependence on supplemental oxygen: Secondary | ICD-10-CM | POA: Diagnosis not present

## 2018-08-04 DIAGNOSIS — Z833 Family history of diabetes mellitus: Secondary | ICD-10-CM

## 2018-08-04 DIAGNOSIS — R6510 Systemic inflammatory response syndrome (SIRS) of non-infectious origin without acute organ dysfunction: Secondary | ICD-10-CM | POA: Diagnosis not present

## 2018-08-04 DIAGNOSIS — Z8249 Family history of ischemic heart disease and other diseases of the circulatory system: Secondary | ICD-10-CM

## 2018-08-04 DIAGNOSIS — E8889 Other specified metabolic disorders: Secondary | ICD-10-CM | POA: Diagnosis present

## 2018-08-04 DIAGNOSIS — I70262 Atherosclerosis of native arteries of extremities with gangrene, left leg: Secondary | ICD-10-CM | POA: Diagnosis not present

## 2018-08-04 DIAGNOSIS — Z4781 Encounter for orthopedic aftercare following surgical amputation: Secondary | ICD-10-CM | POA: Diagnosis not present

## 2018-08-04 DIAGNOSIS — I10 Essential (primary) hypertension: Secondary | ICD-10-CM | POA: Diagnosis not present

## 2018-08-04 DIAGNOSIS — D649 Anemia, unspecified: Secondary | ICD-10-CM | POA: Diagnosis not present

## 2018-08-04 DIAGNOSIS — K5903 Drug induced constipation: Secondary | ICD-10-CM | POA: Diagnosis not present

## 2018-08-04 DIAGNOSIS — G8918 Other acute postprocedural pain: Secondary | ICD-10-CM | POA: Diagnosis not present

## 2018-08-04 HISTORY — DX: Unspecified osteoarthritis, unspecified site: M19.90

## 2018-08-04 HISTORY — DX: Sleep apnea, unspecified: G47.30

## 2018-08-04 HISTORY — DX: Depression, unspecified: F32.A

## 2018-08-04 HISTORY — DX: Pneumonia, unspecified organism: J18.9

## 2018-08-04 HISTORY — DX: Allergy status to unspecified drugs, medicaments and biological substances: Z88.9

## 2018-08-04 HISTORY — PX: AMPUTATION: SHX166

## 2018-08-04 LAB — SURGICAL PCR SCREEN
MRSA, PCR: POSITIVE — AB
Staphylococcus aureus: POSITIVE — AB

## 2018-08-04 LAB — POCT I-STAT 4, (NA,K, GLUC, HGB,HCT)
Glucose, Bld: 132 mg/dL — ABNORMAL HIGH (ref 70–99)
HCT: 32 % — ABNORMAL LOW (ref 39.0–52.0)
Hemoglobin: 10.9 g/dL — ABNORMAL LOW (ref 13.0–17.0)
Potassium: 4.2 mmol/L (ref 3.5–5.1)
Sodium: 130 mmol/L — ABNORMAL LOW (ref 135–145)

## 2018-08-04 LAB — GLUCOSE, CAPILLARY
Glucose-Capillary: 127 mg/dL — ABNORMAL HIGH (ref 70–99)
Glucose-Capillary: 116 mg/dL — ABNORMAL HIGH (ref 70–99)
Glucose-Capillary: 130 mg/dL — ABNORMAL HIGH (ref 70–99)
Glucose-Capillary: 214 mg/dL — ABNORMAL HIGH (ref 70–99)
Glucose-Capillary: 318 mg/dL — ABNORMAL HIGH (ref 70–99)

## 2018-08-04 LAB — SARS CORONAVIRUS 2 BY RT PCR (HOSPITAL ORDER, PERFORMED IN ~~LOC~~ HOSPITAL LAB): SARS Coronavirus 2: NEGATIVE

## 2018-08-04 SURGERY — AMPUTATION, ABOVE KNEE
Anesthesia: Monitor Anesthesia Care | Site: Leg Upper | Laterality: Right

## 2018-08-04 MED ORDER — DOCUSATE SODIUM 100 MG PO CAPS: 100.0000 mg | ORAL_CAPSULE | Freq: Two times a day (BID) | ORAL | Status: DC

## 2018-08-04 MED ORDER — ALBUTEROL SULFATE (2.5 MG/3ML) 0.083% IN NEBU: 3.0000 mL | INHALATION_SOLUTION | RESPIRATORY_TRACT | Status: DC | PRN

## 2018-08-04 MED ORDER — CHLORHEXIDINE GLUCONATE CLOTH 2 % EX PADS
6.0000 | MEDICATED_PAD | Freq: Every day | CUTANEOUS | Status: DC
  Administered 2018-08-05: 6 via TOPICAL

## 2018-08-04 MED ORDER — BRIMONIDINE TARTRATE 0.2 % OP SOLN
1.0000 [drp] | Freq: Two times a day (BID) | OPHTHALMIC | Status: DC
  Administered 2018-08-05 – 2018-08-06 (×5): 1 [drp] via OPHTHALMIC
  Filled 2018-08-04: qty 5

## 2018-08-04 MED ORDER — ACETAMINOPHEN 325 MG PO TABS: 325.0000 mg | ORAL_TABLET | Freq: Four times a day (QID) | ORAL | Status: DC | PRN

## 2018-08-04 MED ORDER — CEFAZOLIN SODIUM-DEXTROSE 2-4 GM/100ML-% IV SOLN
2.0000 g | INTRAVENOUS | Status: AC
  Administered 2018-08-04: 2 g via INTRAVENOUS
  Filled 2018-08-04: qty 100

## 2018-08-04 MED ORDER — OXYCODONE HCL 5 MG PO TABS
5.0000 mg | ORAL_TABLET | ORAL | Status: DC | PRN
  Administered 2018-08-04 – 2018-08-05 (×6): 10 mg via ORAL
  Filled 2018-08-04 (×5): qty 2

## 2018-08-04 MED ORDER — FENTANYL CITRATE (PF) 100 MCG/2ML IJ SOLN
INTRAMUSCULAR | Status: AC
  Filled 2018-08-04: qty 2

## 2018-08-04 MED ORDER — PROPOFOL 10 MG/ML IV BOLUS
INTRAVENOUS | Status: DC | PRN
  Administered 2018-08-04: 130 mg via INTRAVENOUS

## 2018-08-04 MED ORDER — INSULIN ASPART 100 UNIT/ML ~~LOC~~ SOLN
0.0000 [IU] | Freq: Every day | SUBCUTANEOUS | Status: DC
  Administered 2018-08-05 (×2): 2 [IU] via SUBCUTANEOUS

## 2018-08-04 MED ORDER — INSULIN LISPRO (1 UNIT DIAL) 100 UNIT/ML (KWIKPEN): 1.0000 [IU] | PEN_INJECTOR | Freq: Three times a day (TID) | SUBCUTANEOUS | Status: DC

## 2018-08-04 MED ORDER — DEXAMETHASONE SODIUM PHOSPHATE 10 MG/ML IJ SOLN
INTRAMUSCULAR | Status: DC | PRN
  Administered 2018-08-04: 5 mg via INTRAVENOUS

## 2018-08-04 MED ORDER — MIDAZOLAM HCL 2 MG/2ML IJ SOLN: 1.0000 mg | Freq: Once | INTRAMUSCULAR | Status: DC

## 2018-08-04 MED ORDER — HYDROMORPHONE HCL 1 MG/ML IJ SOLN: 0.5000 mg | INTRAMUSCULAR | Status: DC | PRN

## 2018-08-04 MED ORDER — ONDANSETRON HCL 4 MG PO TABS: 4.0000 mg | ORAL_TABLET | Freq: Four times a day (QID) | ORAL | Status: DC | PRN

## 2018-08-04 MED ORDER — METOCLOPRAMIDE HCL 5 MG/ML IJ SOLN: 5.0000 mg | Freq: Three times a day (TID) | INTRAMUSCULAR | Status: DC | PRN

## 2018-08-04 MED ORDER — CALCIUM ACETATE (PHOS BINDER) 667 MG PO CAPS
2001.0000 mg | ORAL_CAPSULE | Freq: Three times a day (TID) | ORAL | Status: DC
  Administered 2018-08-04 – 2018-08-07 (×7): 2001 mg via ORAL
  Filled 2018-08-04 (×7): qty 3

## 2018-08-04 MED ORDER — FENTANYL CITRATE (PF) 100 MCG/2ML IJ SOLN
INTRAMUSCULAR | Status: DC | PRN
  Administered 2018-08-04 (×2): 50 ug via INTRAVENOUS

## 2018-08-04 MED ORDER — BUPIVACAINE-EPINEPHRINE (PF) 0.5% -1:200000 IJ SOLN
INTRAMUSCULAR | Status: DC | PRN
  Administered 2018-08-04: 30 mL via PERINEURAL
  Administered 2018-08-04: 20 mL via PERINEURAL

## 2018-08-04 MED ORDER — METOCLOPRAMIDE HCL 5 MG PO TABS
5.0000 mg | ORAL_TABLET | Freq: Three times a day (TID) | ORAL | Status: DC
  Administered 2018-08-04 – 2018-08-07 (×5): 5 mg via ORAL
  Filled 2018-08-04 (×4): qty 1

## 2018-08-04 MED ORDER — MIDAZOLAM HCL 5 MG/5ML IJ SOLN
INTRAMUSCULAR | Status: DC | PRN
  Administered 2018-08-04: 1 mg via INTRAVENOUS

## 2018-08-04 MED ORDER — FENTANYL CITRATE (PF) 250 MCG/5ML IJ SOLN
INTRAMUSCULAR | Status: AC
  Filled 2018-08-04: qty 5

## 2018-08-04 MED ORDER — CHLORHEXIDINE GLUCONATE 4 % EX LIQD: 60.0000 mL | Freq: Once | CUTANEOUS | Status: DC

## 2018-08-04 MED ORDER — MIDAZOLAM HCL 2 MG/2ML IJ SOLN
INTRAMUSCULAR | Status: AC
  Filled 2018-08-04: qty 2

## 2018-08-04 MED ORDER — ONDANSETRON HCL 4 MG/2ML IJ SOLN
INTRAMUSCULAR | Status: DC | PRN
  Administered 2018-08-04: 4 mg via INTRAVENOUS

## 2018-08-04 MED ORDER — DOCUSATE SODIUM 100 MG PO CAPS
100.0000 mg | ORAL_CAPSULE | Freq: Two times a day (BID) | ORAL | Status: DC
  Administered 2018-08-05 – 2018-08-07 (×5): 100 mg via ORAL
  Filled 2018-08-04 (×6): qty 1

## 2018-08-04 MED ORDER — PANTOPRAZOLE SODIUM 40 MG PO TBEC
40.0000 mg | DELAYED_RELEASE_TABLET | Freq: Every day | ORAL | Status: DC
  Administered 2018-08-04 – 2018-08-07 (×4): 40 mg via ORAL
  Filled 2018-08-04 (×4): qty 1

## 2018-08-04 MED ORDER — LATANOPROST 0.005 % OP SOLN
1.0000 [drp] | Freq: Every day | OPHTHALMIC | Status: DC
  Administered 2018-08-05 – 2018-08-06 (×2): 1 [drp] via OPHTHALMIC
  Filled 2018-08-04: qty 2.5

## 2018-08-04 MED ORDER — SEVELAMER CARBONATE 800 MG PO TABS
800.0000 mg | ORAL_TABLET | Freq: Three times a day (TID) | ORAL | Status: DC
  Administered 2018-08-04 – 2018-08-07 (×7): 800 mg via ORAL
  Filled 2018-08-04 (×7): qty 1

## 2018-08-04 MED ORDER — FENTANYL CITRATE (PF) 100 MCG/2ML IJ SOLN
25.0000 ug | INTRAMUSCULAR | Status: DC | PRN
  Administered 2018-08-04 (×2): 25 ug via INTRAVENOUS

## 2018-08-04 MED ORDER — OXYCODONE HCL 5 MG PO TABS
ORAL_TABLET | ORAL | Status: AC
  Filled 2018-08-04: qty 2

## 2018-08-04 MED ORDER — 0.9 % SODIUM CHLORIDE (POUR BTL) OPTIME
TOPICAL | Status: DC | PRN
  Administered 2018-08-04: 1000 mL

## 2018-08-04 MED ORDER — OXYCODONE HCL 5 MG PO TABS
10.0000 mg | ORAL_TABLET | ORAL | Status: DC | PRN
  Administered 2018-08-06 – 2018-08-07 (×5): 15 mg via ORAL
  Filled 2018-08-04 (×6): qty 3

## 2018-08-04 MED ORDER — METOPROLOL SUCCINATE ER 25 MG PO TB24
50.0000 mg | ORAL_TABLET | Freq: Every day | ORAL | Status: DC
  Administered 2018-08-05 – 2018-08-06 (×3): 50 mg via ORAL
  Filled 2018-08-04 (×3): qty 2

## 2018-08-04 MED ORDER — INSULIN ASPART 100 UNIT/ML ~~LOC~~ SOLN
0.0000 [IU] | Freq: Three times a day (TID) | SUBCUTANEOUS | Status: DC
  Administered 2018-08-04: 7 [IU] via SUBCUTANEOUS
  Administered 2018-08-05: 5 [IU] via SUBCUTANEOUS
  Administered 2018-08-05: 3 [IU] via SUBCUTANEOUS
  Administered 2018-08-05 – 2018-08-06 (×4): 2 [IU] via SUBCUTANEOUS

## 2018-08-04 MED ORDER — FLUTICASONE PROPIONATE 50 MCG/ACT NA SUSP
1.0000 | Freq: Every day | NASAL | Status: DC
  Administered 2018-08-06: 1 via NASAL
  Filled 2018-08-04: qty 16

## 2018-08-04 MED ORDER — ACETAMINOPHEN 500 MG PO TABS
1000.0000 mg | ORAL_TABLET | Freq: Four times a day (QID) | ORAL | Status: AC
  Administered 2018-08-04: 1000 mg via ORAL
  Filled 2018-08-04 (×3): qty 2

## 2018-08-04 MED ORDER — LIDOCAINE 2% (20 MG/ML) 5 ML SYRINGE
INTRAMUSCULAR | Status: DC | PRN
  Administered 2018-08-04: 80 mg via INTRAVENOUS

## 2018-08-04 MED ORDER — TIMOLOL MALEATE 0.5 % OP SOLN
1.0000 [drp] | Freq: Two times a day (BID) | OPHTHALMIC | Status: DC
  Administered 2018-08-05 – 2018-08-06 (×5): 1 [drp] via OPHTHALMIC
  Filled 2018-08-04: qty 5

## 2018-08-04 MED ORDER — POVIDONE-IODINE 10 % EX SWAB: 2.0000 "application " | Freq: Once | CUTANEOUS | Status: DC

## 2018-08-04 MED ORDER — LORATADINE 10 MG PO TABS
10.0000 mg | ORAL_TABLET | Freq: Every evening | ORAL | Status: DC
  Administered 2018-08-04 – 2018-08-06 (×3): 10 mg via ORAL
  Filled 2018-08-04 (×3): qty 1

## 2018-08-04 MED ORDER — FENTANYL CITRATE (PF) 100 MCG/2ML IJ SOLN: 50.0000 ug | Freq: Once | INTRAMUSCULAR | Status: DC

## 2018-08-04 MED ORDER — LACTATED RINGERS IV SOLN
INTRAVENOUS | Status: DC
  Administered 2018-08-04: 16:00:00 via INTRAVENOUS

## 2018-08-04 MED ORDER — MIDAZOLAM HCL 2 MG/2ML IJ SOLN
INTRAMUSCULAR | Status: AC
  Administered 2018-08-04: 1 mg
  Filled 2018-08-04: qty 2

## 2018-08-04 MED ORDER — METOCLOPRAMIDE HCL 5 MG PO TABS: 5.0000 mg | ORAL_TABLET | Freq: Three times a day (TID) | ORAL | Status: DC | PRN

## 2018-08-04 MED ORDER — METOCLOPRAMIDE HCL 5 MG/ML IJ SOLN: 10.0000 mg | Freq: Once | INTRAMUSCULAR | Status: DC | PRN

## 2018-08-04 MED ORDER — ONDANSETRON HCL 4 MG/2ML IJ SOLN: 4.0000 mg | Freq: Four times a day (QID) | INTRAMUSCULAR | Status: DC | PRN

## 2018-08-04 MED ORDER — SODIUM CHLORIDE 0.9 % IV SOLN
INTRAVENOUS | Status: DC
  Administered 2018-08-04: 07:00:00 via INTRAVENOUS

## 2018-08-04 MED ORDER — FENTANYL CITRATE (PF) 100 MCG/2ML IJ SOLN
INTRAMUSCULAR | Status: AC
  Administered 2018-08-04: 08:00:00 50 ug
  Filled 2018-08-04: qty 2

## 2018-08-04 SURGICAL SUPPLY — 41 items
BLADE SAW RECIP 87.9 MT (BLADE) ×2 IMPLANT
BLADE SAW RECIPROCATING 77.5 (BLADE) ×1 IMPLANT
BLADE SURG 21 STRL SS (BLADE) ×2 IMPLANT
BNDG COHESIVE 6X5 TAN STRL LF (GAUZE/BANDAGES/DRESSINGS) ×2 IMPLANT
CANISTER WOUND CARE 500ML ATS (WOUND CARE) ×1 IMPLANT
COVER SURGICAL LIGHT HANDLE (MISCELLANEOUS) ×2 IMPLANT
COVER WAND RF STERILE (DRAPES) IMPLANT
CUFF TOURNIQUET SINGLE 34IN LL (TOURNIQUET CUFF) IMPLANT
DRAPE INCISE IOBAN 66X45 STRL (DRAPES) ×4 IMPLANT
DRAPE U-SHAPE 47X51 STRL (DRAPES) ×2 IMPLANT
DRESSING PREVENA PLUS CUSTOM (GAUZE/BANDAGES/DRESSINGS) ×1 IMPLANT
DRSG PREVENA PLUS CUSTOM (GAUZE/BANDAGES/DRESSINGS) ×4
DURAPREP 26ML APPLICATOR (WOUND CARE) ×2 IMPLANT
ELECT REM PT RETURN 9FT ADLT (ELECTROSURGICAL) ×2
ELECTRODE REM PT RTRN 9FT ADLT (ELECTROSURGICAL) ×1 IMPLANT
GLOVE BIOGEL PI IND STRL 7.5 (GLOVE) ×1 IMPLANT
GLOVE BIOGEL PI IND STRL 9 (GLOVE) ×1 IMPLANT
GLOVE BIOGEL PI INDICATOR 7.5 (GLOVE) ×1
GLOVE BIOGEL PI INDICATOR 9 (GLOVE) ×1
GLOVE SURG ORTHO 9.0 STRL STRW (GLOVE) ×2 IMPLANT
GLOVE SURG SS PI 6.5 STRL IVOR (GLOVE) ×2 IMPLANT
GOWN STRL REUS W/ TWL LRG LVL3 (GOWN DISPOSABLE) ×1 IMPLANT
GOWN STRL REUS W/ TWL XL LVL3 (GOWN DISPOSABLE) ×2 IMPLANT
GOWN STRL REUS W/TWL LRG LVL3 (GOWN DISPOSABLE) ×1
GOWN STRL REUS W/TWL XL LVL3 (GOWN DISPOSABLE) ×2
KIT BASIN OR (CUSTOM PROCEDURE TRAY) ×2 IMPLANT
KIT TURNOVER KIT B (KITS) ×2 IMPLANT
MANIFOLD NEPTUNE II (INSTRUMENTS) ×2 IMPLANT
NS IRRIG 1000ML POUR BTL (IV SOLUTION) ×2 IMPLANT
PACK ORTHO EXTREMITY (CUSTOM PROCEDURE TRAY) ×2 IMPLANT
PAD ARMBOARD 7.5X6 YLW CONV (MISCELLANEOUS) ×2 IMPLANT
PREVENA RESTOR ARTHOFORM 46X30 (CANNISTER) ×2 IMPLANT
STAPLER VISISTAT 35W (STAPLE) IMPLANT
STOCKINETTE IMPERVIOUS LG (DRAPES) IMPLANT
SUT ETHILON 2 0 PSLX (SUTURE) ×4 IMPLANT
SUT SILK 2 0 (SUTURE) ×1
SUT SILK 2 0 TIES 10X30 (SUTURE) ×1 IMPLANT
SUT SILK 2-0 18XBRD TIE 12 (SUTURE) ×1 IMPLANT
TOWEL GREEN STERILE FF (TOWEL DISPOSABLE) ×2 IMPLANT
TUBE CONNECTING 20X1/4 (TUBING) ×2 IMPLANT
YANKAUER SUCT BULB TIP NO VENT (SUCTIONS) ×3 IMPLANT

## 2018-08-04 NOTE — Anesthesia Procedure Notes (Signed)
Procedure Name: LMA Insertion Date/Time: 08/04/2018 8:57 AM Performed by: Candis Shine, CRNA Pre-anesthesia Checklist: Patient identified, Emergency Drugs available, Suction available and Patient being monitored Patient Re-evaluated:Patient Re-evaluated prior to induction Oxygen Delivery Method: Circle System Utilized Preoxygenation: Pre-oxygenation with 100% oxygen Induction Type: IV induction LMA: LMA with gastric port inserted LMA Size: 5.0 Number of attempts: 1 Placement Confirmation: positive ETCO2 Tube secured with: Tape Dental Injury: Teeth and Oropharynx as per pre-operative assessment

## 2018-08-04 NOTE — Progress Notes (Signed)
Renal Navigator notified OP HD clinic/SW of patient's admission, surgery, and negative COVID 19 rapid test result to provide continuity of care and safety.  Alphonzo Cruise Renal Navigator [phone]

## 2018-08-04 NOTE — Op Note (Addendum)
08/04/2018  9:42 AM  PATIENT:  Howard Bell    PRE-OPERATIVE DIAGNOSIS:  Gangrene Right Below Knee Amputation  POST-OPERATIVE DIAGNOSIS:  Same  PROCEDURE:  RIGHT ABOVE KNEE AMPUTATION Application of incisional wound VAC  SURGEON:  Newt Minion, MD  PHYSICIAN ASSISTANT:None ANESTHESIA:   General  PREOPERATIVE INDICATIONS:  [name] is a  53 y.o. male with a diagnosis of Gangrene Right Below Knee Amputation who failed conservative measures and elected for surgical management.    The risks benefits and alternatives were discussed with the patient preoperatively including but not limited to the risks of infection, bleeding, nerve injury, cardiopulmonary complications, the need for revision surgery, among others, and the patient was willing to proceed.  OPERATIVE IMPLANTS: Incisional wound VAC including the restore and customizable wound VAC dressing  @ENCIMAGES @  OPERATIVE FINDINGS: Good bleeding good healthy muscle at the level of amputation.  OPERATIVE PROCEDURE: Patient was brought the operating room underwent a general anesthetic.  After adequate levels anesthesia were obtained patient's right lower extremity was prepped using DuraPrep draped into a sterile field a timeout was called.  A fishmouth incision was made just proximal to the patella.  This was carried down through the quad muscles proximal to the capsule of the knee.  The vascular bundles medially were clamped and suture ligated with 2-0 silk.  The amputation was completed with a reciprocating saw.  Electrocautery was used for further hemostasis the wound was irrigated with normal saline.  The muscle had good petechial bleeding good contractility good color good consistency.  The deep fascia layers were closed using #1 Vicryl the superficial fascia layer and skin was closed using 2-0 nylon as well as staples.  A customizable and restore wound VAC dressing was applied this had a good suction fit patient was  extubated taken the PACU in stable condition   DISCHARGE PLANNING:  Antibiotic duration: 24-hour antibiotics  Weightbearing: Nonweightbearing on the right  Pain medication: High-dose opioid pathway  Dressing care/ Wound VAC: Continue wound VAC for 1 week at discharge  Ambulatory devices: Walker  Discharge to: His wife wants to take him home with HHPT  Follow-up: In the office 1 week post operative.

## 2018-08-04 NOTE — Social Work (Signed)
CSW acknowledging consult for SNF placement. Will follow for therapy recommendations.   Raynah Gomes, MSW, LCSWA [place] Clinical Social Work [phone]   

## 2018-08-04 NOTE — Anesthesia Procedure Notes (Signed)
Anesthesia Regional Block: Popliteal block   Pre-Anesthetic Checklist: ,, timeout performed, Correct Patient, Correct Site, Correct Laterality, Correct Procedure, Correct Position, site marked, Risks and benefits discussed,  Surgical consent,  Pre-op evaluation,  At surgeon's request and post-op pain management  Laterality: Right  Prep: chloraprep       Needles:  Injection technique: Single-shot  Needle Type: Echogenic Stimulator Needle     Needle Length: 9cm  Needle Gauge: 21   Needle insertion depth: 7 cm   Additional Needles:   Procedures:,,,, ultrasound used (permanent image in chart),,,,  Narrative:  Start time: 08/04/2018 7:51 AM End time: 08/04/2018 7:56 AM Injection made incrementally with aspirations every 5 mL.  Performed by: Personally  Anesthesiologist: Josephine Igo, MD  Additional Notes: Timeout performed. Patient sedated. Relevant anatomy ID'd using [place]. Incremental 2-46ml injection of LA with frequent aspiration. Patient tolerated procedure well.

## 2018-08-04 NOTE — Transfer of Care (Signed)
Immediate Anesthesia Transfer of Care Note  Patient: Howard Bell  Procedure(s) Performed: RIGHT ABOVE KNEE AMPUTATION (Right Leg Upper)  Patient Location: PACU  Anesthesia Type:GA combined with regional for post-op pain  Level of Consciousness: awake, alert  and oriented  Airway & Oxygen Therapy: Patient Spontanous Breathing and Patient connected to nasal cannula oxygen  Post-op Assessment: Report given to RN and Post -op Vital signs reviewed and stable  Post vital signs: Reviewed and stable  Last Vitals:  Vitals Value Taken Time  BP    Temp    Pulse    Resp    SpO2      Last Pain:  Vitals:   08/04/18 0657  TempSrc:   PainSc: 0-No pain      Patients Stated Pain Goal: 3 (40/37/09 6438)  Complications: No apparent anesthesia complications

## 2018-08-04 NOTE — Addendum Note (Signed)
Addendum  created 08/04/18 1032 by Candis Shine, CRNA   Charge Capture section accepted

## 2018-08-04 NOTE — Consult Note (Addendum)
[facility] Renal Consultation Note    Indication for Consultation:  Management of ESRD/hemodialysis; anemia, hypertension/volume and secondary hyperparathyroidism  HPI: Howard Bell is a 53 y.o. male with ESRD on HD MWF at [facility]. PMH also DM Type 2, HTN, prior CVA, OSA.  Resides at [place].   S/p R BKA on 05/14/18. Apparently poor follow up with surgery since amputation. This week found to have new ischemic ulceration to stump and revision scheduled. Underwent R AKA this am per Dr. Sharol Given.  Labs Na 130, K 4.2, Hgb 10.9, Glucose 132.   Due for routine dialysis today. Last HD Wednesday. Left 1kg below EDW EDW recently lowred. Referred to wound care for ulcer L hand.   Seen and examined at bedside. Feels "sore". On 2L [place], felt SOB in morning, better now.  Denies fevers, chills, CP, N/V, abd pain.  When I saw him , eating tray of food- c/o some pain   Past Medical History:  Diagnosis Date  . Arthritis   . Congestive heart failure (CHF) ([place])   . Depression   . 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 Diabetic gastroparesis ([place]) 11/06/2017  . ESRD (end stage renal disease) on dialysis ([place])   . H/O seasonal allergies   . Hypertension   . Pneumonia   . Sleep apnea    Past Surgical History:  Procedure Laterality Date  . AMPUTATION Right 05/14/2018   Procedure: AMPUTATION BELOW KNEE;  Surgeon: Newt Minion, MD;  Location: [facility];  Service: Orthopedics;  Laterality: Right;  . ESOPHAGOGASTRODUODENOSCOPY (EGD) WITH PROPOFOL N/A 12/05/2017   Procedure: ESOPHAGOGASTRODUODENOSCOPY (EGD) WITH PROPOFOL;  Surgeon: Doran Stabler, MD;  Location: [facility];  Service: Gastroenterology;  Laterality: N/A;  . HERNIA REPAIR    . IR AV DIALY SHUNT INTRO NEEDLE/INTRACATH INITIAL W/PTA/IMG LEFT  03/30/2017   Family History  Problem Relation Age of Onset  . Diabetes Mother   . Hypertension Father    Social History:   reports that he has never smoked. He has never used smokeless tobacco. He reports that he does not drink alcohol or use drugs. No Known Allergies Prior to Admission medications   Medication Sig Start Date End Date Taking? Authorizing Provider  brimonidine (ALPHAGAN) 0.2 % ophthalmic solution Place 1 drop into both eyes 2 (two) times daily.   Yes [provider]  calcium acetate (PHOSLO) 667 MG capsule Take 2,001 mg by mouth 3 (three) times daily with meals.    Yes [provider]  docusate sodium (COLACE) 100 MG capsule Take 100 mg by mouth 2 (two) times daily.   Yes [provider]  doxycycline (VIBRA-TABS) 100 MG tablet Take 1 tablet (100 mg total) by mouth 2 (two) times daily. 05/18/18  Yes Charlynne Cousins, MD  fluticasone [facility]) 50 MCG/ACT nasal spray Place 1 spray into both nostrils daily.   Yes [provider]  insulin lispro (ADMELOG SOLOSTAR) 100 UNIT/ML KwikPen Inject 1-9 Units into the skin 3 (three) times daily. If bs is 121-150=1 unit, 151-200=2 units, 201-250=3 units, 251-300=5 units, 301-350=7 units, 351-400=9 units   Yes [provider]  latanoprost (XALATAN) 0.005 % ophthalmic solution Place 1 drop into both eyes at bedtime.   Yes [provider]  loratadine (CLARITIN) 10 MG tablet Take 10 mg by mouth every evening.   Yes [provider]  Melatonin 3 MG CAPS Take 3 mg by mouth at bedtime.   Yes  [provider]  metoCLOPramide (REGLAN) 5 MG tablet Take 1 tablet (5 mg total) by mouth 3 (three) times daily before meals. 09/23/17  Yes Danis, Kirke Corin, MD  metoprolol succinate (TOPROL-XL) 50 MG 24 hr tablet Take 1 tablet (50 mg total) by mouth at bedtime. Take with or immediately following a meal. 05/18/18  Yes Charlynne Cousins, MD  omeprazole (PRILOSEC) 20 MG capsule Take 20 mg by mouth at bedtime.   Yes [provider]  oxyCODONE 10 MG TABS Take 1-1.5 tablets (10-15 mg total) by mouth every 4  (four) hours as needed for severe pain (pain score 7-10). Patient taking differently: Take 10 mg by mouth every 4 (four) hours as needed (pain).  05/18/18  Yes Charlynne Cousins, MD  sevelamer carbonate (RENVELA) 800 MG tablet TAKE 1 TABLET BY MOUTH DAILY WITH MEALS FOR HYPOCALCEMIA Patient taking differently: Take 800 mg by mouth 3 (three) times daily with meals.  12/13/17  Yes Sagardia, Ines Bloomer, MD  silver sulfADIAZINE (SILVADENE) 1 % cream Apply 1 application topically daily. Apply to left foot and ankle   Yes [provider]  timolol (BETIMOL) 0.5 % ophthalmic solution Place 1 drop into both eyes 2 (two) times daily. 05/07/17  Yes English, Colletta Maryland D, PA  albuterol (PROVENTIL HFA;VENTOLIN HFA) 108 (90 Base) MCG/ACT inhaler Inhale 1 puff into the lungs every 4 (four) hours as needed for wheezing or shortness of breath.     [provider]  ondansetron (ZOFRAN) 4 MG tablet Take 1 tablet (4 mg total) by mouth every 8 (eight) hours as needed for nausea or vomiting. 05/02/18   Virgel Manifold, MD   Current Facility-Administered Medications  Medication Dose Route Frequency Provider Last Rate Last Dose  . acetaminophen (TYLENOL) tablet 1,000 mg  1,000 mg Oral Q6H Rayburn, Neta Mends, PA-C      . [START ON 08/05/2018] acetaminophen (TYLENOL) tablet 325-650 mg  325-650 mg Oral Q6H PRN Rayburn, Neta Mends, PA-C      . albuterol (PROVENTIL) (2.5 MG/3ML) 0.083% nebulizer solution 3 mL  3 mL Inhalation Q4H PRN Rayburn, Neta Mends, PA-C      . brimonidine (ALPHAGAN) 0.2 % ophthalmic solution 1 drop  1 drop Both Eyes BID Rayburn, Shawn Montgomery, PA-C      . calcium acetate (PHOSLO) capsule 2,001 mg  2,001 mg Oral TID WC Rayburn, Shawn Montgomery, PA-C      . docusate sodium (COLACE) capsule 100 mg  100 mg Oral BID Rayburn, Shawn Montgomery, PA-C      . fentaNYL (SUBLIMAZE) 100 MCG/2ML injection           . [START ON 08/05/2018] fluticasone (FLONASE) 50 MCG/ACT nasal  spray 1 spray  1 spray Each Nare Daily Rayburn, Neta Mends, PA-C      . HYDROmorphone (DILAUDID) injection 0.5-1 mg  0.5-1 mg Intravenous Q4H PRN Rayburn, Neta Mends, PA-C      . insulin aspart (novoLOG) injection 0-5 Units  0-5 Units Subcutaneous QHS Rayburn, Shawn Montgomery, PA-C      . insulin aspart (novoLOG) injection 0-9 Units  0-9 Units Subcutaneous TID WC Rayburn, Shawn Montgomery, PA-C      . lactated ringers infusion   Intravenous Continuous Rayburn, Shawn Montgomery, PA-C      . latanoprost (XALATAN) 0.005 % ophthalmic solution 1 drop  1 drop Both Eyes QHS Rayburn, Shawn Montgomery, PA-C      . loratadine (CLARITIN) tablet 10 mg  10 mg Oral QPM Rayburn, Neta Mends, PA-C      .  metoCLOPramide (REGLAN) tablet 5-10 mg  5-10 mg Oral Q8H PRN Rayburn, Neta Mends, PA-C       Or  . metoCLOPramide (REGLAN) injection 5-10 mg  5-10 mg Intravenous Q8H PRN Rayburn, Neta Mends, PA-C      . metoCLOPramide (REGLAN) tablet 5 mg  5 mg Oral TID AC Rayburn, Shawn Montgomery, PA-C      . metoprolol succinate (TOPROL-XL) 24 hr tablet 50 mg  50 mg Oral QHS Rayburn, Shawn Montgomery, PA-C      . ondansetron (ZOFRAN) tablet 4 mg  4 mg Oral Q6H PRN Rayburn, Neta Mends, PA-C       Or  . ondansetron (ZOFRAN) injection 4 mg  4 mg Intravenous Q6H PRN Rayburn, Neta Mends, PA-C      . oxyCODONE (Oxy IR/ROXICODONE) 5 MG immediate release tablet           . oxyCODONE (Oxy IR/ROXICODONE) immediate release tablet 10-15 mg  10-15 mg Oral Q4H PRN Rayburn, Neta Mends, PA-C      . oxyCODONE (Oxy IR/ROXICODONE) immediate release tablet 5-10 mg  5-10 mg Oral Q4H PRN Rayburn, Neta Mends, PA-C   10 mg at 08/04/18 1050  . pantoprazole (PROTONIX) EC tablet 40 mg  40 mg Oral Daily Rayburn, Shawn Montgomery, PA-C      . sevelamer carbonate (RENVELA) tablet 800 mg  800 mg Oral TID WC Rayburn, Shawn Montgomery, PA-C      . timolol (TIMOPTIC) 0.5 % ophthalmic solution 1 drop  1  drop Both Eyes BID Rayburn, Shawn Montgomery, PA-C         ROS: As per HPI otherwise negative.  Physical Exam: Vitals:   08/04/18 1200 08/04/18 1230 08/04/18 1243 08/04/18 1300  BP: (!) 179/84 (!) 153/77 (!) 168/83 (!) 169/82  Pulse: 92 88 87 86  Resp: 18 17 14 15   Temp:   (!) 97.3 F (36.3 C) 98.1 F (36.7 C)  TempSrc:      SpO2: 94% 96% 95% 95%  Weight:      Height:         General: WDWN NAD male NAD Head: NCAT sclera not icteric MMM Neck: Supple. No JVD No masses Lungs: CTA bilaterally without wheezes, rales, or rhonchi. Breathing is unlabored. Heart: RRR with S1 S2 Abdomen: soft NT + BS Lower extremities: R AKA with shrinker  Neuro: A & O  X 3. Moves all extremities spontaneously. Psych:  Responds to questions appropriately with a normal affect. Dialysis Access:LUE AVG +bruit   Labs: Basic Metabolic Panel: Recent Labs  Lab 08/04/18 0625  NA 130*  K 4.2  GLUCOSE 132*   Liver Function Tests: No results for input(s): AST, ALT, ALKPHOS, BILITOT, PROT, ALBUMIN in the last 168 hours. No results for input(s): LIPASE, AMYLASE in the last 168 hours. No results for input(s): AMMONIA in the last 168 hours. CBC: Recent Labs  Lab 08/04/18 0625  HGB 10.9*  HCT 32.0*   Cardiac Enzymes: No results for input(s): CKTOTAL, CKMB, CKMBINDEX, TROPONINI in the last 168 hours. CBG: Recent Labs  Lab 08/04/18 0603 08/04/18 0824 08/04/18 0946 08/04/18 1233  GLUCAP 127* 116* 130* 214*   Iron Studies: No results for input(s): IRON, TIBC, TRANSFERRIN, FERRITIN in the last 72 hours. Studies/Results: No results found.  Dialysis Orders:  [place] 4.25h BFR 450/2x EDW 95.5kg 2K/2.25Ca UFP 2 Hep bolus 9000 U Parsabiv 10mg  TIW Mircera 150 mcg IV q 2 weeks (last 5/4)  Assessment/Plan: 1. Gangrene R BKA stump>>underwent R AKA this am per  Dr. Sharol Given  2. ESRD -  Usually HD MWF. Plan HD today on schedule.  2K bath. Hold heparin.  3. Hypertension/volume  - BP elevated likely with  volume. Should improve with UF. Has been leaving below EDW as outpatient. UF 3-4L as tolerated.  4. Anemia  - Hgb 10.9 Next ESA dose due 5/18.  5. Metabolic bone disease -  Continue Ca acetate +Renvela as binder. Parsabiv not available in hospital. Follow labs.  6. DM Type 2 -insulin per primary   Lynnda Child PA-C [place] Pager [phone] 08/04/2018, 2:14 PM   Patient seen and examined, agree with above note with above modifications. Pt well known to me.  ESRD- s/p BKA now needing to be revised to an AKA.  In some pain but looks pretty good.  Plan for HD today on schedule and cont with HD related medications  Corliss Parish, MD 08/04/2018

## 2018-08-04 NOTE — Anesthesia Postprocedure Evaluation (Signed)
Anesthesia Post Note  Patient: Howard Bell  Procedure(s) Performed: RIGHT ABOVE KNEE AMPUTATION (Right Leg Upper)     Patient location during evaluation: PACU Anesthesia Type: General Level of consciousness: awake and alert and oriented Pain management: pain level controlled Vital Signs Assessment: post-procedure vital signs reviewed and stable Respiratory status: spontaneous breathing, nonlabored ventilation, respiratory function stable and patient connected to nasal cannula oxygen Cardiovascular status: blood pressure returned to baseline and stable Postop Assessment: no apparent nausea or vomiting Anesthetic complications: no    Last Vitals:  Vitals:   08/04/18 0805 08/04/18 0945  BP: (!) 164/73 (!) 150/70  Pulse: 94 93  Resp: 15 15  Temp:  (!) 36.1 C  SpO2: 99% 96%    Last Pain:  Vitals:   08/04/18 0945  TempSrc:   PainSc: Asleep                 Endi Lagman A.

## 2018-08-04 NOTE — H&P (Signed)
Howard Bell is an 53 y.o. male.   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  He has been in skilled nursing and had not followed up on a regular basis following surgery.  He developed new ischemic ulceration over the posterior aspect of his left ankle and superficial abscess of the dorsal aspect of the left foot as well as dehiscence of the transtibial amputation site with abscess formation.  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  Past Medical History:  Diagnosis Date  . Arthritis   . Congestive heart failure (CHF) ([place])   . Depression   . 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 Diabetic gastroparesis ([place]) 11/06/2017  . ESRD (end stage renal disease) on dialysis ([place])   . H/O seasonal allergies   . Hypertension   . Pneumonia   . Sleep apnea     Past Surgical History:  Procedure Laterality Date  . AMPUTATION Right 05/14/2018   Procedure: AMPUTATION BELOW KNEE;  Surgeon: Newt Minion, MD;  Location: [facility];  Service: Orthopedics;  Laterality: Right;  . ESOPHAGOGASTRODUODENOSCOPY (EGD) WITH PROPOFOL N/A 12/05/2017   Procedure: ESOPHAGOGASTRODUODENOSCOPY (EGD) WITH PROPOFOL;  Surgeon: Doran Stabler, MD;  Location: [facility];  Service: Gastroenterology;  Laterality: N/A;  . HERNIA REPAIR    . IR AV DIALY SHUNT INTRO NEEDLE/INTRACATH INITIAL W/PTA/IMG LEFT  03/30/2017    Family History  Problem Relation Age of Onset  . Diabetes Mother   . Hypertension Father    Social History:  reports that he has never smoked. He has never used smokeless tobacco. He reports that he does not drink alcohol or use drugs.  Allergies: No Known Allergies  Medications Prior to Admission  Medication Sig Dispense Refill  . brimonidine (ALPHAGAN) 0.2 % ophthalmic solution Place  1 drop into both eyes 2 (two) times daily.    . calcium acetate (PHOSLO) 667 MG capsule Take 2,001 mg by mouth 3 (three) times daily with meals.     . docusate sodium (COLACE) 100 MG capsule Take 100 mg by mouth 2 (two) times daily.    Marland Kitchen doxycycline (VIBRA-TABS) 100 MG tablet Take 1 tablet (100 mg total) by mouth 2 (two) times daily. 28 tablet 0  . fluticasone (FLONASE) 50 MCG/ACT nasal spray Place 1 spray into both nostrils daily.    . insulin lispro (ADMELOG SOLOSTAR) 100 UNIT/ML KwikPen Inject 1-9 Units into the skin 3 (three) times daily. If bs is 121-150=1 unit, 151-200=2 units, 201-250=3 units, 251-300=5 units, 301-350=7 units, 351-400=9 units    . latanoprost (XALATAN) 0.005 % ophthalmic solution Place 1 drop into both eyes at bedtime.    Marland Kitchen loratadine (CLARITIN) 10 MG tablet Take 10 mg by mouth every evening.    . Melatonin 3 MG CAPS Take 3 mg by mouth at bedtime.    . metoCLOPramide (REGLAN) 5 MG tablet Take 1 tablet (5 mg total) by mouth 3 (three) times daily before meals. 90 tablet 0  . metoprolol succinate (TOPROL-XL) 50 MG 24 hr tablet Take 1 tablet (50 mg total) by mouth at bedtime. Take with or immediately following a meal.    . omeprazole (PRILOSEC) 20 MG capsule Take 20 mg by mouth at bedtime.    Marland Kitchen oxyCODONE 10 MG TABS Take 1-1.5 tablets (10-15 mg total) by mouth every 4 (four) hours as  needed for severe pain (pain score 7-10). (Patient taking differently: Take 10 mg by mouth every 4 (four) hours as needed (pain). ) 15 tablet 0  . sevelamer carbonate (RENVELA) 800 MG tablet TAKE 1 TABLET BY MOUTH DAILY WITH MEALS FOR HYPOCALCEMIA (Patient taking differently: Take 800 mg by mouth 3 (three) times daily with meals. ) 270 tablet 0  . silver sulfADIAZINE (SILVADENE) 1 % cream Apply 1 application topically daily. Apply to left foot and ankle    . timolol (BETIMOL) 0.5 % ophthalmic solution Place 1 drop into both eyes 2 (two) times daily. 10 mL 1  . albuterol (PROVENTIL HFA;VENTOLIN HFA) 108  (90 Base) MCG/ACT inhaler Inhale 1 puff into the lungs every 4 (four) hours as needed for wheezing or shortness of breath.     . ondansetron (ZOFRAN) 4 MG tablet Take 1 tablet (4 mg total) by mouth every 8 (eight) hours as needed for nausea or vomiting. 12 tablet 0    Results for orders placed or performed during the hospital encounter of 08/04/18 (from the past 48 hour(s))  Glucose, capillary     Status: Abnormal   Collection Time: 08/04/18  6:03 AM  Result Value Ref Range   Glucose-Capillary 127 (H) 70 - 99 mg/dL  I-STAT 4, (NA,K, GLUC, HGB,HCT)     Status: Abnormal   Collection Time: 08/04/18  6:25 AM  Result Value Ref Range   Sodium 130 (L) 135 - 145 mmol/L   Potassium 4.2 3.5 - 5.1 mmol/L   Glucose, Bld 132 (H) 70 - 99 mg/dL   HCT 32.0 (L) 39.0 - 52.0 %   Hemoglobin 10.9 (L) 13.0 - 17.0 g/dL   No results found.  Review of Systems  All other systems reviewed and are negative.   Blood pressure (!) 171/86, pulse 97, temperature 98.8 F (37.1 C), temperature source Oral, resp. rate 18, height 6' (1.829 m), weight 97.5 kg, SpO2 100 %. Physical Exam  Constitutional: He is oriented to person, place, and time. He appears well-developed and well-nourished. No distress.  HENT:  Head: Normocephalic and atraumatic.  Neck: No tracheal deviation present. No thyromegaly present.  Cardiovascular: Normal rate.  Respiratory: Effort normal. No stridor. No respiratory distress.  GI: Soft. He exhibits no distension.  Musculoskeletal:     Comments: . Examination patient's left foot he has ischemic skin changes over the posterior aspect of the Achilles.  There is also a very small superficial abscess over the dorsum of the fourth and fifth metatarsal heads.  This was unroofed the abscess was decompressed.  A Doppler was used and patient has a strong biphasic dorsalis pedis pulse and a biphasic posterior tibial pulse on the left.  Examination of the right transtibial amputation shows gangrenous  changes full-thickness of almost the entire end of the residual limb is also ischemic gangrenous changes up to the tibial tubercle.  The eschar was unroofed and there was an abscess as well as necrotic muscle and soft tissue.  Patient does not have sufficient viable tissue to revise the transtibial amputation.  Neurological: He is alert and oriented to person, place, and time. No cranial nerve deficit. Coordination normal.  Skin: Skin is warm.  Psychiatric: He has a normal mood and affect. His behavior is normal. Judgment and thought content normal.     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  The procedure, possible risks and complications and benefits were discussed with the patient including the risk of bleeding, infection, neurovascular injury, and possible need for further surgery were discussed with the patient and questions answered to his satisfaction.  He wishes to proceed with surgery at this time.  [name], PA-C 08/04/2018, 7:01 AM [facility] [phone]

## 2018-08-04 NOTE — Procedures (Signed)
Patient was seen on dialysis and the procedure was supervised.  BFR 400  Via AVG BP is  174/93.   Patient appears to be tolerating treatment well  Howard Bell 08/04/2018

## 2018-08-04 NOTE — Evaluation (Signed)
Physical Therapy Evaluation Patient Details Name: Howard Bell MRN: [id number] DOB: December 12, 1965 Today's Date: 08/04/2018   History of Present Illness  Pt is a 53 y/o male s/p R transfemoral amputation. 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    Clinical Impression  Pt presented supine in bed with HOB elevated, awake and willing to participate in therapy session. Since previous amputation surgery in Feb 2020, pt has been at a SNF where he has been ambulating short distances with RW, using a w/c primarily for mobility and bathing/dressing himself with assist for set-up. Prior to SNF, pt was living with his wife. At the time of evaluation, pt limited secondary to post-op pain and weakness. He required min A for bed mobility and min A to perform a lateral scoot in bed. Pt would greatly benefit from intensive therapy services at [facility] prior to returning home with spouse. Pt would continue to benefit from skilled physical therapy services at this time while admitted and after d/c to address the below listed limitations in order to improve overall safety and independence with functional mobility.     Follow Up Recommendations [facility]    Equipment Recommendations  Rolling walker with 5" wheels;Wheelchair (measurements PT);Wheelchair cushion (measurements PT)    Recommendations for Other Services       Precautions / Restrictions Precautions Precautions: Fall Restrictions Weight Bearing Restrictions: No      Mobility  Bed Mobility Overal bed mobility: Needs Assistance Bed Mobility: Supine to Sit;Sit to Supine     Supine to sit: Min assist Sit to supine: Min guard   General bed mobility comments: increased time and effort, assistance needed for trunk stabilization when achieving upright at EOB  Transfers                 General transfer comment: lateral scooting in bed towards HOB with min A for  safety/stability  Ambulation/Gait                Stairs            Wheelchair Mobility    Modified Rankin (Stroke Patients Only)       Balance Overall balance assessment: Needs assistance Sitting-balance support: Bilateral upper extremity supported;Single extremity supported Sitting balance-Leahy Scale: Poor                                       Pertinent Vitals/Pain Pain Assessment: 0-10 Pain Score: 7  Pain Location: R residual limb Pain Descriptors / Indicators: Sore Pain Intervention(s): Monitored during session;Repositioned;Patient requesting pain meds-RN notified    Home Living Family/patient expects to be discharged to:: Private residence Living Arrangements: Spouse/significant other Available Help at Discharge: Family;Available PRN/intermittently Type of Home: Apartment Home Access: Stairs to enter   Entrance Stairs-Number of Steps: 14 Home Layout: One level Home Equipment: Walker - 2 wheels      Prior Function Level of Independence: Needs assistance   Gait / Transfers Assistance Needed: can get in/out of w/c by himself; ambulates short distances with RW; able to propel himself when in w/c  ADL's / Homemaking Assistance Needed: assistance for set-up; pt able to wash himself        Hand Dominance        Extremity/Trunk Assessment   Upper Extremity Assessment Upper Extremity Assessment: Overall WFL for tasks assessed    Lower Extremity Assessment Lower  Extremity Assessment: RLE deficits/detail RLE Deficits / Details: wound VAC in place; pt able to perform hip flexion against gravity x1  RLE: Unable to fully assess due to pain       Communication   Communication: No difficulties  Cognition Arousal/Alertness: Awake/alert Behavior During Therapy: WFL for tasks assessed/performed Overall Cognitive Status: Impaired/Different from baseline Area of Impairment: Memory;Following commands;Safety/judgement;Awareness;Problem  solving                     Memory: Decreased short-term memory;Decreased recall of precautions Following Commands: Follows one step commands inconsistently;Follows one step commands with increased time Safety/Judgement: Decreased awareness of deficits;Decreased awareness of safety Awareness: Intellectual Problem Solving: Slow processing;Decreased initiation;Difficulty sequencing;Requires verbal cues        General Comments      Exercises     Assessment/Plan    PT Assessment Patient needs continued PT services  PT Problem List Decreased strength;Decreased balance;Decreased mobility;Decreased range of motion;Decreased activity tolerance;Decreased coordination;Decreased cognition;Decreased knowledge of use of DME;Decreased knowledge of precautions;Decreased safety awareness;Pain       PT Treatment Interventions DME instruction;Gait training;Stair training;Functional mobility training;Therapeutic exercise;Balance training;Therapeutic activities;Neuromuscular re-education;Cognitive remediation;Patient/family education    PT Goals (Current goals can be found in the Care Plan section)  Acute Rehab PT Goals Patient Stated Goal: "to go home with my wife" PT Goal Formulation: With patient Time For Goal Achievement: 08/18/18 Potential to Achieve Goals: Good    Frequency Min 5X/week   Barriers to discharge        Co-evaluation               AM-PAC PT "6 Clicks" Mobility  Outcome Measure Help needed turning from your back to your side while in a flat bed without using bedrails?: A Little Help needed moving from lying on your back to sitting on the side of a flat bed without using bedrails?: A Little Help needed moving to and from a bed to a chair (including a wheelchair)?: Total Help needed standing up from a chair using your arms (e.g., wheelchair or bedside chair)?: Total Help needed to walk in hospital room?: Total Help needed climbing 3-5 steps with a railing? :  Total 6 Click Score: 10    End of Session   Activity Tolerance: Patient limited by pain Patient left: in bed;with call bell/phone within reach;with bed alarm set Nurse Communication: Mobility status PT Visit Diagnosis: Other abnormalities of gait and mobility (R26.89)    Time: 7342-8768 PT Time Calculation (min) (ACUTE ONLY): 20 min   Charges:   PT Evaluation $PT Eval Moderate Complexity: [place], PT, DPT  Acute Rehabilitation Services Pager [phone] Office [place] 08/04/2018, 5:19 PM

## 2018-08-04 NOTE — Anesthesia Procedure Notes (Signed)
Anesthesia Regional Block: Femoral nerve block   Pre-Anesthetic Checklist: ,, timeout performed, Correct Patient, Correct Site, Correct Laterality, Correct Procedure, Correct Position, site marked, Risks and benefits discussed, Surgical consent,  Pre-op evaluation,  At surgeon's request  Laterality: Right  Prep: chloraprep       Needles:  Injection technique: Single-shot  Needle Type: Echogenic Stimulator Needle     Needle Length: 9cm  Needle Gauge: 21   Needle insertion depth: 7 cm   Additional Needles:   Procedures:,,,, ultrasound used (permanent image in chart),,,,  Narrative:  Start time: 08/04/2018 7:46 AM End time: 08/04/2018 7:50 AM Injection made incrementally with aspirations every 5 mL.  Performed by: Personally  Anesthesiologist: Josephine Igo, MD  Additional Notes: Timeout performed. Patient sedated. Relevant anatomy ID'd using [place]. Incremental 2-62ml injection of LA with frequent aspiration. Patient tolerated procedure well.        Right Femoral Nerve Block

## 2018-08-05 ENCOUNTER — Encounter (HOSPITAL_COMMUNITY): Payer: Self-pay | Admitting: Orthopedic Surgery

## 2018-08-05 LAB — GLUCOSE, CAPILLARY
Glucose-Capillary: 184 mg/dL — ABNORMAL HIGH (ref 70–99)
Glucose-Capillary: 205 mg/dL — ABNORMAL HIGH (ref 70–99)
Glucose-Capillary: 224 mg/dL — ABNORMAL HIGH (ref 70–99)
Glucose-Capillary: 227 mg/dL — ABNORMAL HIGH (ref 70–99)
Glucose-Capillary: 290 mg/dL — ABNORMAL HIGH (ref 70–99)

## 2018-08-05 MED ORDER — CHLORHEXIDINE GLUCONATE CLOTH 2 % EX PADS
6.0000 | MEDICATED_PAD | Freq: Every day | CUTANEOUS | Status: DC
  Administered 2018-08-05: 6 via TOPICAL

## 2018-08-05 NOTE — Progress Notes (Signed)
Subjective:  HD overnight- removed 3500- tolerated well - says pain is better  Objective Vital signs in last 24 hours: Vitals:   08/04/18 2336 08/05/18 0002 08/05/18 0025 08/05/18 0428  BP: (!) 102/53 95/60 96/66  132/71  Pulse: 96 90 94 87  Resp: 18  20 16   Temp: 97.6 F (36.4 C)  97.8 F (36.6 C) 98 F (36.7 C)  TempSrc: Oral  Oral Oral  SpO2: 99%  100% 94%  Weight: 93.7 kg     Height:       Weight change: -0.723 kg  Intake/Output Summary (Last 24 hours) at 08/05/2018 0927 Last data filed at 08/05/2018 0700 Gross per 24 hour  Intake 785.31 ml  Output 3500 ml  Net -2714.69 ml    Dialysis Orders:  [facility] 4.25h BFR 450/2x EDW 95.5kg 2K/2.25Ca UFP 2 Hep bolus 9000 U Parsabiv 10mg  TIW Mircera 150 mcg IV q 2 weeks (last 5/4)  Assessment/Plan: 1. 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 Sharol Given.  Pain control and rehab  2. ESRD -  Usually HD MWF. HD yest on schedule- next on Monday.  2K bath. Hold heparin.  3. Hypertension/volume  - BP elevated likely with volume, fine after HD. Likely will need lower EDW at discharge 4. Anemia  - Hgb 10.9 Next ESA dose due 5/18.  5. Metabolic bone disease -  Continue Ca acetate +Renvela as binder. Parsabiv not available in hospital. Follow labs.  6. DM Type 2 -insulin per primary       [place]: Basic Metabolic Panel: Recent Labs  Lab 08/04/18 0625  NA 130*  K 4.2  GLUCOSE 132*   Liver Function Tests: No results for input(s): AST, ALT, ALKPHOS, BILITOT, PROT, ALBUMIN in the last 168 hours. No results for input(s): LIPASE, AMYLASE in the last 168 hours. No results for input(s): AMMONIA in the last 168 hours. CBC: Recent Labs  Lab 08/04/18 0625  HGB 10.9*  HCT 32.0*   Cardiac Enzymes: No results for input(s): CKTOTAL, CKMB, CKMBINDEX, TROPONINI in the last 168 hours. CBG: Recent Labs  Lab 08/04/18 0946 08/04/18 1233 08/04/18 1714 08/05/18 0021 08/05/18 0808  GLUCAP 130* 214* 318* 205*  290*    Iron Studies: No results for input(s): IRON, TIBC, TRANSFERRIN, FERRITIN in the last 72 hours. Studies/Results: No results found. Medications: Infusions: . lactated ringers Stopped (08/04/18 1835)    Scheduled Medications: . acetaminophen  1,000 mg Oral Q6H  . brimonidine  1 drop Both Eyes BID  . calcium acetate  2,001 mg Oral TID WC  . Chlorhexidine Gluconate Cloth  6 each Topical Q0600  . docusate sodium  100 mg Oral BID  . fluticasone  1 spray Each Nare Daily  . insulin aspart  0-5 Units Subcutaneous QHS  . insulin aspart  0-9 Units Subcutaneous TID WC  . latanoprost  1 drop Both Eyes QHS  . loratadine  10 mg Oral QPM  . metoCLOPramide  5 mg Oral TID AC  . metoprolol succinate  50 mg Oral QHS  . pantoprazole  40 mg Oral Daily  . sevelamer carbonate  800 mg Oral TID WC  . timolol  1 drop Both Eyes BID    have reviewed scheduled and prn medications.  Physical Exam: General: NAD Heart: RRR Lungs: mostly clear Abdomen: obese, soft, non tender Extremities: min edema Dialysis Access: left AVF     08/05/2018,9:27 AM  LOS: 1 day

## 2018-08-05 NOTE — Progress Notes (Signed)
Physical Therapy Treatment Patient Details Name: Howard Bell MRN: [id number] DOB: May 31, 1965 Today's Date: 08/05/2018    History of Present Illness Pt is a 53 y/o male s/p R transfemoral amputation. 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    PT Comments    Pt making steady progress with functional mobility and tolerated progressing to pre-gait and transfer training this session. He was a bit lethargic this session which somewhat limited him. Pt requesting pain meds, RN notified. Pt would continue to benefit from skilled physical therapy services at this time while admitted and after d/c to address the below listed limitations in order to improve overall safety and independence with functional mobility.    Follow Up Recommendations  [facility]     Equipment Recommendations  Rolling walker with 5" wheels;Wheelchair (measurements PT);Wheelchair cushion (measurements PT)    Recommendations for Other Services       Precautions / Restrictions Precautions Precautions: Fall Precaution Comments: wound VAC Restrictions Weight Bearing Restrictions: No    Mobility  Bed Mobility Overal bed mobility: Needs Assistance Bed Mobility: Supine to Sit     Supine to sit: Min guard     General bed mobility comments: HOB elevated, use of bed rails, min guard for safety to achieve upright sitting at EOB towards pt's R side  Transfers Overall transfer level: Needs assistance Equipment used: Rolling walker (2 wheeled) Transfers: Sit to/from [facility] Sit to Stand: Min assist;From elevated surface Stand pivot transfers: Min assist       General transfer comment: assist for stability with transitional movements; good technique with RW utilized; pt with instability initially with full standing position  Ambulation/Gait             General Gait Details: pt able to take a few hops on L LE with RW during pivot to  chair   Stairs             Wheelchair Mobility    Modified Rankin (Stroke Patients Only)       Balance Overall balance assessment: Needs assistance Sitting-balance support: No upper extremity supported Sitting balance-Leahy Scale: Fair     Standing balance support: Bilateral upper extremity supported Standing balance-Leahy Scale: Poor                              Cognition Arousal/Alertness: Lethargic Behavior During Therapy: WFL for tasks assessed/performed Overall Cognitive Status: Impaired/Different from baseline Area of Impairment: Memory;Following commands;Safety/judgement;Awareness;Problem solving                     Memory: Decreased short-term memory;Decreased recall of precautions Following Commands: Follows one step commands inconsistently;Follows one step commands with increased time Safety/Judgement: Decreased awareness of deficits;Decreased awareness of safety Awareness: Intellectual Problem Solving: Slow processing;Decreased initiation;Difficulty sequencing;Requires verbal cues        Exercises      General Comments        Pertinent Vitals/Pain Pain Assessment: Faces Faces Pain Scale: Hurts a little bit Pain Location: R residual limb Pain Descriptors / Indicators: Sore Pain Intervention(s): Monitored during session;Repositioned    Home Living                      Prior Function            PT Goals (current goals can now be found in the care plan section) Acute Rehab  PT Goals PT Goal Formulation: With patient Time For Goal Achievement: 08/18/18 Potential to Achieve Goals: Good Progress towards PT goals: Progressing toward goals    Frequency    Min 5X/week      PT Plan Current plan remains appropriate    Co-evaluation              AM-PAC PT "6 Clicks" Mobility   Outcome Measure  Help needed turning from your back to your side while in a flat bed without using bedrails?: A Little Help  needed moving from lying on your back to sitting on the side of a flat bed without using bedrails?: A Little Help needed moving to and from a bed to a chair (including a wheelchair)?: A Lot Help needed standing up from a chair using your arms (e.g., wheelchair or bedside chair)?: A Lot Help needed to walk in hospital room?: A Lot Help needed climbing 3-5 steps with a railing? : Total 6 Click Score: 13    End of Session Equipment Utilized During Treatment: Gait belt Activity Tolerance: Patient limited by lethargy;Patient limited by pain Patient left: in chair;with call bell/phone within reach;with chair alarm set Nurse Communication: Mobility status PT Visit Diagnosis: Other abnormalities of gait and mobility (R26.89)     Time: 0945-1000 PT Time Calculation (min) (ACUTE ONLY): 15 min  Charges:  $Therapeutic Activity: 8-22 mins                     Sherie Don, PT, DPT  Acute Rehabilitation Services Pager [phone] Office [place] 08/05/2018, 10:35 AM

## 2018-08-05 NOTE — Evaluation (Addendum)
Occupational Therapy Evaluation Patient Details Name: Howard Bell MRN: [id number] DOB: 1965/08/12 Today's Date: 08/05/2018    History of Present Illness Pt is a 53 y/o male s/p R transfemoral amputation. 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   Clinical Impression   Patient admitted for above, limited by pain, impaired balance, decreased functional reach to L LE, and decreased activity tolerance.  Patient reports PTA able to complete ADLs with setup assist, using BSC with independence for toileting needs.  Patient currently requires mod assist for LB ADLs, mod assist to stand from low recliner, setup assist for UB ADLs/grooming.  Pt following all commands, but appears lethargic throughout session. He will benefit from continued OT services while admitted and after discharge at CIR level in order to maximize independence and safety with ADLs/mobility and decrease burden of care to spouse.  Will follow.      Follow Up Recommendations  CIR    Equipment Recommendations  3 in 1 bedside commode    Recommendations for Other Services       Precautions / Restrictions Precautions Precautions: Fall Precaution Comments: wound VAC Restrictions Weight Bearing Restrictions: No      Mobility Bed Mobility               General bed mobility comments: OOB upon entry   Transfers Overall transfer level: Needs assistance Equipment used: Rolling walker (2 wheeled) Transfers: Sit to/from Stand Sit to Stand: Mod assist         General transfer comment: mod assist to ascend from recliner with cueing for hand placement and safety     Balance Overall balance assessment: Needs assistance Sitting-balance support: No upper extremity supported Sitting balance-Leahy Scale: Fair     Standing balance support: Bilateral upper extremity supported;During functional activity Standing balance-Leahy Scale: Poor Standing balance  comment: relaint on B UE support                           ADL either performed or assessed with clinical judgement   ADL Overall ADL's : Needs assistance/impaired     Grooming: Set up;Sitting   Upper Body Bathing: Set up;Sitting   Lower Body Bathing: Moderate assistance;Sit to/from stand Lower Body Bathing Details (indicate cue type and reason): decreased functional reach to L LE, mod assist sit<>stand  Upper Body Dressing : Set up;Sitting   Lower Body Dressing: Moderate assistance;Sit to/from stand Lower Body Dressing Details (indicate cue type and reason): decreased functional reach to L LE, mod assist sit<>stand and reliant on B UE support in standing    Toilet Transfer Details (indicate cue type and reason): pt declined, sit to stand from recliner with mod assist  Toileting- Clothing Manipulation and Hygiene: Moderate assistance;Sit to/from stand       Functional mobility during ADLs: Moderate assistance;Rolling walker;Cueing for safety;Cueing for sequencing General ADL Comments: pt limited by pain, weakness and impaired balance      Vision         Perception     Praxis      Pertinent Vitals/Pain Pain Assessment: Faces Faces Pain Scale: Hurts a little bit Pain Location: R residual limb Pain Descriptors / Indicators: Sore Pain Intervention(s): Monitored during session;Repositioned     Hand Dominance Right   Extremity/Trunk Assessment Upper Extremity Assessment Upper Extremity Assessment: Overall WFL for tasks assessed   Lower Extremity Assessment Lower Extremity Assessment: Defer to PT evaluation  Communication Communication Communication: No difficulties   Cognition Arousal/Alertness: Lethargic Behavior During Therapy: Flat affect Overall Cognitive Status: Impaired/Different from baseline Area of Impairment: Memory;Following commands;Safety/judgement;Awareness;Problem solving                     Memory: Decreased short-term  memory;Decreased recall of precautions Following Commands: Follows one step commands inconsistently;Follows one step commands with increased time Safety/Judgement: Decreased awareness of deficits;Decreased awareness of safety Awareness: Intellectual Problem Solving: Slow processing;Decreased initiation;Difficulty sequencing;Requires verbal cues     General Comments       Exercises     Shoulder Instructions      Home Living Family/patient expects to be discharged to:: Private residence Living Arrangements: Spouse/significant other Available Help at Discharge: Family;Available PRN/intermittently Type of Home: Apartment Home Access: Stairs to enter Entrance Stairs-Number of Steps: 14   Home Layout: One level     Bathroom Shower/Tub: Teacher, early years/pre: Standard     Home Equipment: Environmental consultant - 2 wheels          Prior Functioning/Environment Level of Independence: Needs assistance  Gait / Transfers Assistance Needed: can get in/out of w/c by himself; ambulates short distances with RW; able to propel himself when in w/c ADL's / Homemaking Assistance Needed: requires setup assist for ADLs            OT Problem List: Decreased activity tolerance;Impaired balance (sitting and/or standing);Decreased coordination;Decreased cognition;Decreased safety awareness;Decreased knowledge of precautions;Decreased knowledge of use of DME or AE;Pain      OT Treatment/Interventions: Self-care/ADL training;DME and/or AE instruction;Therapeutic activities;Patient/family education;Balance training;Cognitive remediation/compensation;Therapeutic exercise    OT Goals(Current goals can be found in the care plan section) Acute Rehab OT Goals Patient Stated Goal: "to go home with my wife" OT Goal Formulation: With patient Time For Goal Achievement: 08/19/18 Potential to Achieve Goals: Good  OT Frequency: Min 2X/week   Barriers to D/C:            Co-evaluation               AM-PAC OT "6 Clicks" Daily Activity     Outcome Measure Help from another person eating meals?: None Help from another person taking care of personal grooming?: A Little Help from another person toileting, which includes using toliet, bedpan, or urinal?: A Little Help from another person bathing (including washing, rinsing, drying)?: A Lot Help from another person to put on and taking off regular upper body clothing?: None Help from another person to put on and taking off regular lower body clothing?: A Lot 6 Click Score: 18   End of Session Equipment Utilized During Treatment: Gait belt;Rolling walker Nurse Communication: Mobility status  Activity Tolerance: Patient tolerated treatment well Patient left: in chair;with call bell/phone within reach;with chair alarm set  OT Visit Diagnosis: Other abnormalities of gait and mobility (R26.89);Pain Pain - Right/Left: Right Pain - part of body: (AKA)                Time: 8850-2774 OT Time Calculation (min): 12 min Charges:  OT General Charges $OT Visit: 1 Visit OT Evaluation $OT Eval Moderate Complexity: 1 [place], OT Acute Rehabilitation Services Pager [phone] Office [phone]   Delight Stare 08/05/2018, 2:25 PM

## 2018-08-05 NOTE — Progress Notes (Signed)
Subjective: 1 Day Post-Op Procedure(s) (LRB): RIGHT ABOVE KNEE AMPUTATION (Right) Patient reports pain as mild.  No acute changes overnight  Objective: Vital signs in last 24 hours: Temp:  [97 F (36.1 C)-98.6 F (37 C)] 98 F (36.7 C) (05/16 0428) Pulse Rate:  [61-99] 87 (05/16 0428) Resp:  [11-20] 16 (05/16 0428) BP: (95-179)/(40-94) 132/71 (05/16 0428) SpO2:  [92 %-100 %] 94 % (05/16 0428) Weight:  [93.7 kg-96.8 kg] 93.7 kg (05/15 2336)  Intake/Output from previous day: 05/15 0701 - 05/16 0700 In: 1085.3 [P.O.:600; I.V.:485.3] Out: 3600 [Blood:100] Intake/Output this shift: No intake/output data recorded.  Recent Labs    08/04/18 0625  HGB 10.9*   Recent Labs    08/04/18 0625  HCT 32.0*   Recent Labs    08/04/18 0625  NA 130*  K 4.2  GLUCOSE 132*   No results for input(s): LABPT, INR in the last 72 hours.  Right AKA incisional VAC in place with good seal.   Assessment/Plan: 1 Day Post-Op Procedure(s) (LRB): RIGHT ABOVE KNEE AMPUTATION (Right) Up with therapy Discharge likely at the first of the week.     Mcarthur Rossetti 08/05/2018, 8:24 AM

## 2018-08-05 NOTE — Progress Notes (Addendum)
Rehab Admissions Coordinator Note:  Patient was screened by Michel Santee, PT, DPT for appropriateness for an Inpatient Acute Rehab Consult.  At this time, we are recommending Inpatient Rehab consult.  Please place IP Rehab MD Consult order.   Michel Santee 08/05/2018, 9:34 AM  I can be reached at [phone].

## 2018-08-06 LAB — GLUCOSE, CAPILLARY
Glucose-Capillary: 166 mg/dL — ABNORMAL HIGH (ref 70–99)
Glucose-Capillary: 154 mg/dL — ABNORMAL HIGH (ref 70–99)
Glucose-Capillary: 155 mg/dL — ABNORMAL HIGH (ref 70–99)
Glucose-Capillary: 113 mg/dL — ABNORMAL HIGH (ref 70–99)

## 2018-08-06 MED ORDER — DARBEPOETIN ALFA 60 MCG/0.3ML IJ SOSY
60.0000 ug | PREFILLED_SYRINGE | INTRAMUSCULAR | Status: DC
  Administered 2018-08-07: 10:00:00 60 ug via INTRAVENOUS
  Filled 2018-08-06: qty 0.3

## 2018-08-06 MED ORDER — CHLORHEXIDINE GLUCONATE CLOTH 2 % EX PADS: 6.0000 | MEDICATED_PAD | Freq: Every day | CUTANEOUS | Status: DC

## 2018-08-06 MED ORDER — MUPIROCIN 2 % EX OINT: TOPICAL_OINTMENT | Freq: Two times a day (BID) | CUTANEOUS | Status: DC

## 2018-08-06 MED ORDER — MUPIROCIN 2 % EX OINT
1.0000 "application " | TOPICAL_OINTMENT | Freq: Two times a day (BID) | CUTANEOUS | Status: DC
  Administered 2018-08-06 – 2018-08-07 (×2): 1 via NASAL
  Filled 2018-08-06: qty 22

## 2018-08-06 NOTE — Progress Notes (Signed)
Patient ID: Howard Bell, male   DOB: [birth year], 53 y.o.   MRN: [id number] No acute changes.  Working with therapy.  Inpatient Rehab has been recommended.  Stump dressing/incisional VAC in place with good seal.

## 2018-08-06 NOTE — Progress Notes (Signed)
Subjective:  Progressing well, possibly to inpatient rehab this admit ?  No new c/o's  Objective Vital signs in last 24 hours: Vitals:   08/05/18 0428 08/05/18 1654 08/05/18 2008 08/06/18 0351  BP: 132/71 127/77 120/72 (!) 146/79  Pulse: 87 88 89 84  Resp: 16 18    Temp: 98 F (36.7 C) 98.7 F (37.1 C) 98.4 F (36.9 C) 98.2 F (36.8 C)  TempSrc: Oral Oral Oral Oral  SpO2: 94% 91% 92% 97%  Weight:      Height:       Weight change:   Intake/Output Summary (Last 24 hours) at 08/06/2018 0928 Last data filed at 08/05/2018 2300 Gross per 24 hour  Intake 500 ml  Output 0 ml  Net 500 ml    Dialysis Orders:  [place] 4.25h BFR 450/2x EDW 95.5kg 2K/2.25Ca UFP 2 Hep bolus 9000 U Parsabiv 10mg  TIW Mircera 150 mcg IV q 2 weeks (last 5/4)  Assessment/Plan: 1. 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 Sharol Given.  Pain control and rehab  2. ESRD -  Usually HD MWF. HD on schedule- next on Monday.  2K bath. Will resume some tight heparin  3. Hypertension/volume  - BP elevated likely with volume, fine after HD. Will need lower EDW at discharge 4. Anemia  - Hgb 10.9 Next ESA dose due 5/18.  5. Metabolic bone disease -  Continue Ca acetate +Renvela as binder. Parsabiv not available in hospital. Follow labs.  6. DM Type 2 -insulin per primary       [place]: Basic Metabolic Panel: Recent Labs  Lab 08/04/18 0625  NA 130*  K 4.2  GLUCOSE 132*   Liver Function Tests: No results for input(s): AST, ALT, ALKPHOS, BILITOT, PROT, ALBUMIN in the last 168 hours. No results for input(s): LIPASE, AMYLASE in the last 168 hours. No results for input(s): AMMONIA in the last 168 hours. CBC: Recent Labs  Lab 08/04/18 0625  HGB 10.9*  HCT 32.0*   Cardiac Enzymes: No results for input(s): CKTOTAL, CKMB, CKMBINDEX, TROPONINI in the last 168 hours. CBG: Recent Labs  Lab 08/05/18 0808 08/05/18 1251 08/05/18 1742 08/05/18 2240 08/06/18 0736  GLUCAP 290* 227*  184* 224* 166*    Iron Studies: No results for input(s): IRON, TIBC, TRANSFERRIN, FERRITIN in the last 72 hours. Studies/Results: No results found. Medications: Infusions: . lactated ringers Stopped (08/04/18 1835)    Scheduled Medications: . brimonidine  1 drop Both Eyes BID  . calcium acetate  2,001 mg Oral TID WC  . Chlorhexidine Gluconate Cloth  6 each Topical Q0600  . Chlorhexidine Gluconate Cloth  6 each Topical Q0600  . docusate sodium  100 mg Oral BID  . fluticasone  1 spray Each Nare Daily  . insulin aspart  0-5 Units Subcutaneous QHS  . insulin aspart  0-9 Units Subcutaneous TID WC  . latanoprost  1 drop Both Eyes QHS  . loratadine  10 mg Oral QPM  . metoCLOPramide  5 mg Oral TID AC  . metoprolol succinate  50 mg Oral QHS  . pantoprazole  40 mg Oral Daily  . sevelamer carbonate  800 mg Oral TID WC  . timolol  1 drop Both Eyes BID    have reviewed scheduled and prn medications.  Physical Exam: General: NAD Heart: RRR Lungs: mostly clear Abdomen: obese, soft, non tender Extremities: min edema Dialysis Access: left AVF     08/06/2018,9:28 AM  LOS: 2 days

## 2018-08-07 ENCOUNTER — Encounter (HOSPITAL_COMMUNITY): Payer: Self-pay | Admitting: *Deleted

## 2018-08-07 ENCOUNTER — Other Ambulatory Visit: Payer: Self-pay

## 2018-08-07 ENCOUNTER — Inpatient Hospital Stay (HOSPITAL_COMMUNITY)
Admission: RE | Admit: 2018-08-07 | Discharge: 2018-08-15 | DRG: 559 | Disposition: A | Discharge status: Home Health | Payer: Medicare Other | Source: Other Acute Inpatient Hospital | Attending: Physical Medicine & Rehabilitation | Admitting: Physical Medicine & Rehabilitation

## 2018-08-07 DIAGNOSIS — E1143 Type 2 diabetes mellitus with diabetic autonomic (poly)neuropathy: Secondary | ICD-10-CM | POA: Diagnosis not present

## 2018-08-07 DIAGNOSIS — J984 Other disorders of lung: Secondary | ICD-10-CM | POA: Diagnosis not present

## 2018-08-07 DIAGNOSIS — E1169 Type 2 diabetes mellitus with other specified complication: Secondary | ICD-10-CM

## 2018-08-07 DIAGNOSIS — D638 Anemia in other chronic diseases classified elsewhere: Secondary | ICD-10-CM

## 2018-08-07 DIAGNOSIS — E11621 Type 2 diabetes mellitus with foot ulcer: Secondary | ICD-10-CM | POA: Diagnosis present

## 2018-08-07 DIAGNOSIS — I96 Gangrene, not elsewhere classified: Secondary | ICD-10-CM

## 2018-08-07 DIAGNOSIS — Z992 Dependence on renal dialysis: Secondary | ICD-10-CM | POA: Diagnosis not present

## 2018-08-07 DIAGNOSIS — Z833 Family history of diabetes mellitus: Secondary | ICD-10-CM | POA: Diagnosis not present

## 2018-08-07 DIAGNOSIS — E1122 Type 2 diabetes mellitus with diabetic chronic kidney disease: Secondary | ICD-10-CM | POA: Diagnosis not present

## 2018-08-07 DIAGNOSIS — E1152 Type 2 diabetes mellitus with diabetic peripheral angiopathy with gangrene: Secondary | ICD-10-CM | POA: Diagnosis present

## 2018-08-07 DIAGNOSIS — S78112A Complete traumatic amputation at level between left hip and knee, initial encounter: Secondary | ICD-10-CM | POA: Diagnosis not present

## 2018-08-07 DIAGNOSIS — Z8249 Family history of ischemic heart disease and other diseases of the circulatory system: Secondary | ICD-10-CM | POA: Diagnosis not present

## 2018-08-07 DIAGNOSIS — E1165 Type 2 diabetes mellitus with hyperglycemia: Secondary | ICD-10-CM

## 2018-08-07 DIAGNOSIS — E162 Hypoglycemia, unspecified: Secondary | ICD-10-CM | POA: Diagnosis not present

## 2018-08-07 DIAGNOSIS — Z89611 Acquired absence of right leg above knee: Secondary | ICD-10-CM

## 2018-08-07 DIAGNOSIS — N186 End stage renal disease: Secondary | ICD-10-CM | POA: Diagnosis not present

## 2018-08-07 DIAGNOSIS — E669 Obesity, unspecified: Secondary | ICD-10-CM

## 2018-08-07 DIAGNOSIS — K3184 Gastroparesis: Secondary | ICD-10-CM | POA: Diagnosis present

## 2018-08-07 DIAGNOSIS — A419 Sepsis, unspecified organism: Secondary | ICD-10-CM

## 2018-08-07 DIAGNOSIS — R0902 Hypoxemia: Secondary | ICD-10-CM | POA: Diagnosis present

## 2018-08-07 DIAGNOSIS — Z4781 Encounter for orthopedic aftercare following surgical amputation: Principal | ICD-10-CM

## 2018-08-07 DIAGNOSIS — R410 Disorientation, unspecified: Secondary | ICD-10-CM

## 2018-08-07 DIAGNOSIS — I120 Hypertensive chronic kidney disease with stage 5 chronic kidney disease or end stage renal disease: Secondary | ICD-10-CM | POA: Diagnosis not present

## 2018-08-07 DIAGNOSIS — G8918 Other acute postprocedural pain: Secondary | ICD-10-CM | POA: Diagnosis not present

## 2018-08-07 DIAGNOSIS — D631 Anemia in chronic kidney disease: Secondary | ICD-10-CM | POA: Diagnosis present

## 2018-08-07 DIAGNOSIS — K5903 Drug induced constipation: Secondary | ICD-10-CM

## 2018-08-07 DIAGNOSIS — Z7951 Long term (current) use of inhaled steroids: Secondary | ICD-10-CM | POA: Diagnosis not present

## 2018-08-07 DIAGNOSIS — E1129 Type 2 diabetes mellitus with other diabetic kidney complication: Secondary | ICD-10-CM | POA: Diagnosis not present

## 2018-08-07 DIAGNOSIS — I70262 Atherosclerosis of native arteries of extremities with gangrene, left leg: Secondary | ICD-10-CM | POA: Diagnosis not present

## 2018-08-07 DIAGNOSIS — I739 Peripheral vascular disease, unspecified: Secondary | ICD-10-CM | POA: Diagnosis not present

## 2018-08-07 DIAGNOSIS — Z9981 Dependence on supplemental oxygen: Secondary | ICD-10-CM | POA: Diagnosis not present

## 2018-08-07 DIAGNOSIS — I509 Heart failure, unspecified: Secondary | ICD-10-CM | POA: Diagnosis present

## 2018-08-07 DIAGNOSIS — E11649 Type 2 diabetes mellitus with hypoglycemia without coma: Secondary | ICD-10-CM | POA: Diagnosis not present

## 2018-08-07 DIAGNOSIS — IMO0002 Reserved for concepts with insufficient information to code with codable children: Secondary | ICD-10-CM

## 2018-08-07 DIAGNOSIS — R6510 Systemic inflammatory response syndrome (SIRS) of non-infectious origin without acute organ dysfunction: Secondary | ICD-10-CM | POA: Diagnosis not present

## 2018-08-07 DIAGNOSIS — R509 Fever, unspecified: Secondary | ICD-10-CM

## 2018-08-07 DIAGNOSIS — M898X9 Other specified disorders of bone, unspecified site: Secondary | ICD-10-CM | POA: Diagnosis present

## 2018-08-07 DIAGNOSIS — N2581 Secondary hyperparathyroidism of renal origin: Secondary | ICD-10-CM | POA: Diagnosis not present

## 2018-08-07 DIAGNOSIS — G4733 Obstructive sleep apnea (adult) (pediatric): Secondary | ICD-10-CM | POA: Diagnosis present

## 2018-08-07 DIAGNOSIS — S78111A Complete traumatic amputation at level between right hip and knee, initial encounter: Secondary | ICD-10-CM | POA: Diagnosis not present

## 2018-08-07 DIAGNOSIS — E119 Type 2 diabetes mellitus without complications: Secondary | ICD-10-CM

## 2018-08-07 DIAGNOSIS — E1142 Type 2 diabetes mellitus with diabetic polyneuropathy: Secondary | ICD-10-CM | POA: Diagnosis not present

## 2018-08-07 DIAGNOSIS — H409 Unspecified glaucoma: Secondary | ICD-10-CM | POA: Diagnosis present

## 2018-08-07 DIAGNOSIS — M1990 Unspecified osteoarthritis, unspecified site: Secondary | ICD-10-CM | POA: Diagnosis present

## 2018-08-07 DIAGNOSIS — I132 Hypertensive heart and chronic kidney disease with heart failure and with stage 5 chronic kidney disease, or end stage renal disease: Secondary | ICD-10-CM | POA: Diagnosis present

## 2018-08-07 DIAGNOSIS — E1151 Type 2 diabetes mellitus with diabetic peripheral angiopathy without gangrene: Secondary | ICD-10-CM | POA: Diagnosis not present

## 2018-08-07 DIAGNOSIS — L97529 Non-pressure chronic ulcer of other part of left foot with unspecified severity: Secondary | ICD-10-CM | POA: Diagnosis present

## 2018-08-07 DIAGNOSIS — R0989 Other specified symptoms and signs involving the circulatory and respiratory systems: Secondary | ICD-10-CM

## 2018-08-07 DIAGNOSIS — R7309 Other abnormal glucose: Secondary | ICD-10-CM | POA: Diagnosis not present

## 2018-08-07 LAB — RENAL FUNCTION PANEL
Albumin: 2.3 g/dL — ABNORMAL LOW (ref 3.5–5.0)
Anion gap: 16 — ABNORMAL HIGH (ref 5–15)
BUN: 78 mg/dL — ABNORMAL HIGH (ref 6–20)
CO2: 23 mmol/L (ref 22–32)
Calcium: 8.6 mg/dL — ABNORMAL LOW (ref 8.9–10.3)
Chloride: 89 mmol/L — ABNORMAL LOW (ref 98–111)
Creatinine, Ser: 12.62 mg/dL — ABNORMAL HIGH (ref 0.61–1.24)
GFR calc Af Amer: 5 mL/min — ABNORMAL LOW (ref >60)
GFR calc non Af Amer: 4 mL/min — ABNORMAL LOW (ref >60)
Glucose, Bld: 99 mg/dL (ref 70–99)
Phosphorus: 5.3 mg/dL — ABNORMAL HIGH (ref 2.5–4.6)
Potassium: 5.2 mmol/L — ABNORMAL HIGH (ref 3.5–5.1)
Sodium: 128 mmol/L — ABNORMAL LOW (ref 135–145)

## 2018-08-07 LAB — CBC
HCT: 31.3 % — ABNORMAL LOW (ref 39.0–52.0)
Hemoglobin: 10.1 g/dL — ABNORMAL LOW (ref 13.0–17.0)
MCH: 29.2 pg (ref 26.0–34.0)
MCHC: 32.3 g/dL (ref 30.0–36.0)
MCV: 90.5 fL (ref 80.0–100.0)
Platelets: 228 10*3/uL (ref 150–400)
RBC: 3.46 MIL/uL — ABNORMAL LOW (ref 4.22–5.81)
RDW: 16.3 % — ABNORMAL HIGH (ref 11.5–15.5)
WBC: 9.8 10*3/uL (ref 4.0–10.5)
nRBC: 0 % (ref 0.0–0.2)

## 2018-08-07 LAB — GLUCOSE, CAPILLARY
Glucose-Capillary: 83 mg/dL (ref 70–99)
Glucose-Capillary: 71 mg/dL (ref 70–99)
Glucose-Capillary: 159 mg/dL — ABNORMAL HIGH (ref 70–99)

## 2018-08-07 LAB — HEPATITIS B SURFACE ANTIGEN
Hepatitis B Surface Ag: NEGATIVE
Hepatitis B Surface Ag: NEGATIVE

## 2018-08-07 MED ORDER — INSULIN ASPART 100 UNIT/ML ~~LOC~~ SOLN
0.0000 [IU] | Freq: Every day | SUBCUTANEOUS | Status: DC
  Administered 2018-08-09: 2 [IU] via SUBCUTANEOUS

## 2018-08-07 MED ORDER — ACETAMINOPHEN 325 MG PO TABS
325.0000 mg | ORAL_TABLET | ORAL | Status: DC | PRN
  Administered 2018-08-09 – 2018-08-15 (×4): 650 mg via ORAL
  Filled 2018-08-07 (×4): qty 2

## 2018-08-07 MED ORDER — METOPROLOL SUCCINATE ER 50 MG PO TB24
50.0000 mg | ORAL_TABLET | Freq: Every day | ORAL | Status: DC
  Administered 2018-08-07 – 2018-08-13 (×7): 50 mg via ORAL
  Filled 2018-08-07 (×7): qty 1

## 2018-08-07 MED ORDER — TRAZODONE HCL 50 MG PO TABS: 25.0000 mg | ORAL_TABLET | Freq: Every evening | ORAL | Status: DC | PRN

## 2018-08-07 MED ORDER — LORATADINE 10 MG PO TABS
10.0000 mg | ORAL_TABLET | Freq: Every evening | ORAL | Status: DC
  Administered 2018-08-07 – 2018-08-15 (×9): 10 mg via ORAL
  Filled 2018-08-07 (×9): qty 1

## 2018-08-07 MED ORDER — PROCHLORPERAZINE EDISYLATE 10 MG/2ML IJ SOLN
5.0000 mg | Freq: Four times a day (QID) | INTRAMUSCULAR | Status: DC | PRN
  Filled 2018-08-07: qty 2

## 2018-08-07 MED ORDER — PROCHLORPERAZINE MALEATE 5 MG PO TABS
5.0000 mg | ORAL_TABLET | Freq: Four times a day (QID) | ORAL | Status: DC | PRN
  Filled 2018-08-07: qty 2

## 2018-08-07 MED ORDER — BRIMONIDINE TARTRATE 0.2 % OP SOLN
1.0000 [drp] | Freq: Two times a day (BID) | OPHTHALMIC | Status: DC
  Administered 2018-08-07 – 2018-08-15 (×15): 1 [drp] via OPHTHALMIC
  Filled 2018-08-07: qty 5

## 2018-08-07 MED ORDER — CALCIUM ACETATE (PHOS BINDER) 667 MG PO CAPS
2001.0000 mg | ORAL_CAPSULE | Freq: Three times a day (TID) | ORAL | Status: DC
  Administered 2018-08-07 – 2018-08-15 (×25): 2001 mg via ORAL
  Filled 2018-08-07 (×27): qty 3

## 2018-08-07 MED ORDER — DIPHENHYDRAMINE HCL 12.5 MG/5ML PO ELIX
12.5000 mg | ORAL_SOLUTION | Freq: Four times a day (QID) | ORAL | Status: DC | PRN
  Administered 2018-08-10: 25 mg via ORAL
  Filled 2018-08-07: qty 10

## 2018-08-07 MED ORDER — GABAPENTIN 300 MG PO CAPS
300.0000 mg | ORAL_CAPSULE | Freq: Two times a day (BID) | ORAL | Status: DC
  Administered 2018-08-07: 300 mg via ORAL
  Filled 2018-08-07: qty 1

## 2018-08-07 MED ORDER — PROCHLORPERAZINE 25 MG RE SUPP
12.5000 mg | Freq: Four times a day (QID) | RECTAL | Status: DC | PRN
  Filled 2018-08-07: qty 1

## 2018-08-07 MED ORDER — DARBEPOETIN ALFA 60 MCG/0.3ML IJ SOSY: 60.0000 ug | PREFILLED_SYRINGE | INTRAMUSCULAR | Status: DC

## 2018-08-07 MED ORDER — GABAPENTIN 300 MG PO CAPS: 300.0000 mg | ORAL_CAPSULE | Freq: Two times a day (BID) | ORAL | 0 refills | Status: DC

## 2018-08-07 MED ORDER — BISACODYL 10 MG RE SUPP: 10.0000 mg | Freq: Every day | RECTAL | Status: DC | PRN

## 2018-08-07 MED ORDER — GUAIFENESIN-DM 100-10 MG/5ML PO SYRP
5.0000 mL | ORAL_SOLUTION | Freq: Four times a day (QID) | ORAL | Status: DC | PRN
  Filled 2018-08-07: qty 10

## 2018-08-07 MED ORDER — METOCLOPRAMIDE HCL 5 MG PO TABS
5.0000 mg | ORAL_TABLET | Freq: Three times a day (TID) | ORAL | Status: DC
  Administered 2018-08-08 – 2018-08-15 (×24): 5 mg via ORAL
  Filled 2018-08-07 (×24): qty 1

## 2018-08-07 MED ORDER — TIMOLOL MALEATE 0.5 % OP SOLN
1.0000 [drp] | Freq: Two times a day (BID) | OPHTHALMIC | Status: DC
  Administered 2018-08-07 – 2018-08-15 (×16): 1 [drp] via OPHTHALMIC
  Filled 2018-08-07: qty 5

## 2018-08-07 MED ORDER — LATANOPROST 0.005 % OP SOLN
1.0000 [drp] | Freq: Every day | OPHTHALMIC | Status: DC
  Administered 2018-08-07 – 2018-08-14 (×5): 1 [drp] via OPHTHALMIC
  Filled 2018-08-07 (×2): qty 2.5

## 2018-08-07 MED ORDER — OXYCODONE HCL 5 MG PO TABS
ORAL_TABLET | ORAL | Status: AC
  Administered 2018-08-07: 15 mg via ORAL
  Filled 2018-08-07: qty 3

## 2018-08-07 MED ORDER — INSULIN ASPART 100 UNIT/ML ~~LOC~~ SOLN
0.0000 [IU] | Freq: Three times a day (TID) | SUBCUTANEOUS | Status: DC
  Administered 2018-08-07: 2 [IU] via SUBCUTANEOUS
  Administered 2018-08-08: 1 [IU] via SUBCUTANEOUS
  Administered 2018-08-08 (×2): 2 [IU] via SUBCUTANEOUS
  Administered 2018-08-09: 3 [IU] via SUBCUTANEOUS
  Administered 2018-08-09 – 2018-08-15 (×5): 1 [IU] via SUBCUTANEOUS
  Administered 2018-08-15: 3 [IU] via SUBCUTANEOUS

## 2018-08-07 MED ORDER — SEVELAMER CARBONATE 800 MG PO TABS
800.0000 mg | ORAL_TABLET | Freq: Three times a day (TID) | ORAL | Status: DC
  Administered 2018-08-07 – 2018-08-15 (×25): 800 mg via ORAL
  Filled 2018-08-07 (×25): qty 1

## 2018-08-07 MED ORDER — POLYETHYLENE GLYCOL 3350 17 G PO PACK
17.0000 g | PACK | Freq: Every day | ORAL | Status: DC | PRN
  Administered 2018-08-10: 17 g via ORAL
  Filled 2018-08-07: qty 1

## 2018-08-07 MED ORDER — PANTOPRAZOLE SODIUM 40 MG PO TBEC
40.0000 mg | DELAYED_RELEASE_TABLET | Freq: Every day | ORAL | Status: DC
  Administered 2018-08-08 – 2018-08-15 (×8): 40 mg via ORAL
  Filled 2018-08-07 (×8): qty 1

## 2018-08-07 MED ORDER — ONDANSETRON HCL 4 MG/2ML IJ SOLN: 4.0000 mg | Freq: Four times a day (QID) | INTRAMUSCULAR | Status: DC | PRN

## 2018-08-07 MED ORDER — DOCUSATE SODIUM 100 MG PO CAPS
100.0000 mg | ORAL_CAPSULE | Freq: Two times a day (BID) | ORAL | Status: DC
  Administered 2018-08-07 – 2018-08-15 (×16): 100 mg via ORAL
  Filled 2018-08-07 (×16): qty 1

## 2018-08-07 MED ORDER — TRAMADOL HCL 50 MG PO TABS
50.0000 mg | ORAL_TABLET | Freq: Four times a day (QID) | ORAL | Status: DC | PRN
  Administered 2018-08-08 – 2018-08-13 (×3): 50 mg via ORAL
  Filled 2018-08-07 (×3): qty 1

## 2018-08-07 MED ORDER — OXYCODONE HCL 5 MG PO TABS
10.0000 mg | ORAL_TABLET | ORAL | Status: DC | PRN
  Administered 2018-08-08 – 2018-08-12 (×5): 10 mg via ORAL
  Filled 2018-08-07: qty 2
  Filled 2018-08-07: qty 3
  Filled 2018-08-07 (×5): qty 2

## 2018-08-07 MED ORDER — DARBEPOETIN ALFA 60 MCG/0.3ML IJ SOSY
PREFILLED_SYRINGE | INTRAMUSCULAR | Status: AC
  Administered 2018-08-07: 60 ug via INTRAVENOUS
  Filled 2018-08-07: qty 0.3

## 2018-08-07 MED ORDER — ONDANSETRON HCL 4 MG PO TABS: 4.0000 mg | ORAL_TABLET | Freq: Four times a day (QID) | ORAL | Status: DC | PRN

## 2018-08-07 MED ORDER — ALBUTEROL SULFATE (2.5 MG/3ML) 0.083% IN NEBU: 3.0000 mL | INHALATION_SOLUTION | RESPIRATORY_TRACT | Status: DC | PRN

## 2018-08-07 MED ORDER — FLUTICASONE PROPIONATE 50 MCG/ACT NA SUSP
1.0000 | Freq: Every day | NASAL | Status: DC
  Administered 2018-08-08 – 2018-08-15 (×7): 1 via NASAL
  Filled 2018-08-07: qty 16

## 2018-08-07 MED ORDER — ALUMINUM HYDROXIDE GEL 320 MG/5ML PO SUSP
10.0000 mL | ORAL | Status: DC | PRN
  Filled 2018-08-07: qty 30

## 2018-08-07 MED ORDER — MUPIROCIN 2 % EX OINT
1.0000 "application " | TOPICAL_OINTMENT | Freq: Two times a day (BID) | CUTANEOUS | Status: AC
  Administered 2018-08-07 – 2018-08-11 (×8): 1 via NASAL
  Filled 2018-08-07 (×3): qty 22

## 2018-08-07 MED ORDER — CHLORHEXIDINE GLUCONATE CLOTH 2 % EX PADS
6.0000 | MEDICATED_PAD | Freq: Every day | CUTANEOUS | Status: AC
  Administered 2018-08-08 – 2018-08-10 (×3): 6 via TOPICAL

## 2018-08-07 MED ORDER — GABAPENTIN 300 MG PO CAPS
300.0000 mg | ORAL_CAPSULE | Freq: Every day | ORAL | Status: DC
  Administered 2018-08-08 – 2018-08-13 (×6): 300 mg via ORAL
  Filled 2018-08-07 (×6): qty 1

## 2018-08-07 MED ORDER — HEPARIN SODIUM (PORCINE) 5000 UNIT/ML IJ SOLN
5000.0000 [IU] | Freq: Three times a day (TID) | INTRAMUSCULAR | Status: DC
  Administered 2018-08-07 – 2018-08-15 (×21): 5000 [IU] via SUBCUTANEOUS
  Filled 2018-08-07 (×21): qty 1

## 2018-08-07 NOTE — Procedures (Signed)
Patient was seen on dialysis and the procedure was supervised.  BFR 400  Via AVFBP is  120/89.   Patient appears to be tolerating treatment well  Louis Meckel 08/07/2018

## 2018-08-07 NOTE — Progress Notes (Signed)
PT Cancellation Note  Patient Details Name: Howard Bell MRN: [id number] DOB: 03-Aug-1965   Cancelled Treatment:    Reason Eval/Treat Not Completed: Patient at procedure or test/unavailable(of unit for HD)   Koula Venier Eli Hose 08/07/2018, 11:27 AM  Governor Rooks, PTA Acute Rehabilitation Services Pager [phone] Office [phone]

## 2018-08-07 NOTE — Discharge Summary (Addendum)
Discharge Diagnoses:  Active Problems:   S/P BKA (below knee amputation), right [facility] [place] [facility])   Surgeries: Procedure(s): RIGHT ABOVE KNEE AMPUTATION on 08/04/2018    Consultants: Treatment Team:  Corliss Parish, MD  Discharged Condition: Improved  Hospital Course: Howard Bell is an 53 y.o. male who was admitted 08/04/2018 with a chief complaint of dehiscence right transtibial amputation, with a final diagnosis of Gangrene Right Below Knee Amputation.  Patient was brought to the operating room on 08/04/2018 and underwent Procedure(s): RIGHT ABOVE KNEE AMPUTATION.    Patient was given perioperative antibiotics:  Anti-infectives (From admission, onward)   Start     Dose/Rate Route Frequency Ordered Stop   08/04/18 0600  ceFAZolin (ANCEF) IVPB 2g/100 mL premix     2 g 200 mL/hr over 30 Minutes Intravenous On call to O.R. 08/04/18 0556 08/04/18 0900    .  Patient was given sequential compression devices, early ambulation, and aspirin for DVT prophylaxis.  Recent vital signs:  Patient Vitals for the past 24 hrs:  BP Temp Temp src Pulse Resp SpO2 Weight  08/07/18 1100 (!) 145/73 - - 65 - - -  08/07/18 1030 127/75 - - 63 - - -  08/07/18 1000 134/82 - - 92 - - -  08/07/18 0930 111/73 - - 87 - - -  08/07/18 0900 120/89 - - 87 - - -  08/07/18 0830 121/67 - - (!) 58 - - -  08/07/18 0800 (!) 154/80 - - 84 - - -  08/07/18 0730 (!) 148/80 - - 80 - - -  08/07/18 0718 (!) 164/88 - - 79 - - -  08/07/18 0715 (!) 155/82 98.8 F (37.1 C) Oral 79 18 98 % 94.7 kg  08/07/18 0512 (!) 172/81 98.4 F (36.9 C) Oral 79 18 (!) 87 % -  08/06/18 2043 (!) 160/73 98.4 F (36.9 C) Oral 89 19 90 % -  08/06/18 1558 (!) 189/92 99.5 F (37.5 C) Oral 92 20 98 % -  .  Recent laboratory studies: No results found.  Discharge Medications:   Allergies as of 08/07/2018   No Known Allergies     Medication List    STOP taking these medications   doxycycline 100 MG tablet Commonly known as:   VIBRA-TABS   loratadine 10 MG tablet Commonly known as:  CLARITIN   Melatonin 3 MG Caps   Oxycodone HCl 10 MG Tabs   silver sulfADIAZINE 1 % cream Commonly known as:  SILVADENE     TAKE these medications   Admelog SoloStar 100 UNIT/ML KwikPen Generic drug:  insulin lispro Inject 1-9 Units into the skin 3 (three) times daily. If bs is 121-150=1 unit, 151-200=2 units, 201-250=3 units, 251-300=5 units, 301-350=7 units, 351-400=9 units   albuterol 108 (90 Base) MCG/ACT inhaler Commonly known as:  VENTOLIN HFA Inhale 1 puff into the lungs every 4 (four) hours as needed for wheezing or shortness of breath.   brimonidine 0.2 % ophthalmic solution Commonly known as:  ALPHAGAN Place 1 drop into both eyes 2 (two) times daily.   calcium acetate 667 MG capsule Commonly known as:  PHOSLO Take 2,001 mg by mouth 3 (three) times daily with meals.   docusate sodium 100 MG capsule Commonly known as:  COLACE Take 100 mg by mouth 2 (two) times daily.   fluticasone 50 MCG/ACT nasal spray Commonly known as:  FLONASE Place 1 spray into both nostrils daily.   gabapentin 300 MG capsule Commonly known as:  NEURONTIN Take 1 capsule (300  mg total) by mouth 2 (two) times daily.   latanoprost 0.005 % ophthalmic solution Commonly known as:  XALATAN Place 1 drop into both eyes at bedtime.   metoCLOPramide 5 MG tablet Commonly known as:  Reglan Take 1 tablet (5 mg total) by mouth 3 (three) times daily before meals.   metoprolol succinate 50 MG 24 hr tablet Commonly known as:  TOPROL-XL Take 1 tablet (50 mg total) by mouth at bedtime. Take with or immediately following a meal.   omeprazole 20 MG capsule Commonly known as:  PRILOSEC Take 20 mg by mouth at bedtime.   ondansetron 4 MG tablet Commonly known as:  ZOFRAN Take 1 tablet (4 mg total) by mouth every 8 (eight) hours as needed for nausea or vomiting.   sevelamer carbonate 800 MG tablet Commonly known as:  RENVELA TAKE 1 TABLET BY  MOUTH DAILY WITH MEALS FOR HYPOCALCEMIA What changed:  See the new instructions.   timolol 0.5 % ophthalmic solution Commonly known as:  BETIMOL Place 1 drop into both eyes 2 (two) times daily.       Diagnostic Studies: No results found.  Patient benefited maximally from their hospital stay and there were no complications.     Disposition: Discharge disposition: 02-Transferred to [facility]      Discharge Instructions    Call MD / Call 911   Complete by:  As directed    If you experience chest pain or shortness of breath, CALL 911 and be transported to the hospital emergency room.  If you develope a fever above 101 F, pus (white drainage) or increased drainage or redness at the wound, or calf pain, call your surgeon's office.   Constipation Prevention   Complete by:  As directed    Drink plenty of fluids.  Prune juice may be helpful.  You may use a stool softener, such as Colace (over the counter) 100 mg twice a day.  Use MiraLax (over the counter) for constipation as needed.   Diet - low sodium heart healthy   Complete by:  As directed    Increase activity slowly as tolerated   Complete by:  As directed      Follow-up Information    Newt Minion, MD In 1 week.   Specialty:  Orthopedic Surgery Contact information: [place] [address] [phone]            Signed: Newt Minion 08/07/2018, 11:23 AM

## 2018-08-07 NOTE — Progress Notes (Signed)
Patient ID: Howard Bell, male   DOB: 06-03-1965, 53 y.o.   MRN: [id number] Postoperative day 3 right above-the-knee amputation.  There is no drainage in the wound VAC canister.  Occupational Therapy and physical therapy have recommended inpatient rehab.  Inpatient rehab consultation has been placed.  Patient is complaining of pain.  Orders written for Neurontin 2 times a day.

## 2018-08-07 NOTE — Consult Note (Signed)
   [facility] CM Inpatient Consult   08/07/2018  [place] Yearby 28-Mar-1965 [id number]    Patient evaluated for potential needs of [place] [facility] services as benefit from his Medicare/ NextGen plan, with 27% high risk for unplannedreadmission and hospitalizations.   Per chart review and history and physical on 08/04/18, shows that patient is a 53 year old gentleman who underwent a right transtibial amputation on 05/14/2018; has been in skilled nursing and had not followed up on a regular basis following surgery, and developed new ischemic ulceration over the posterior aspect of his left ankle and superficial abscess of the dorsal aspect of the left foot as well as dehiscence of the transtibial amputation site with abscess formation.   Patient was admitted for dehiscence of right transtibial amputation, with Gangrene Right Below Knee Amputation, status post right above the knee amputation.   Patient is being referred to Inpatient rehab [CIR[facility] at [facility] perPT/ OT recommendations. Inpatient Rehab admissions coordinator note states that patient's wife will be able to provide 24/7 assistance and also has a 44 y/o daughter who is home from school and can provide assist if needed.  Patient's primarycareprovider isDr. [place] with [facility].  Will followfor progress anddisposition.Ifthere are any disposition changes,and needs forappropriatecommunity follow-up, please referto [facility] care management.  Of note, [facility] Care Management services does not replace or interfere with any services that are arranged by transition of care case management or social work.   For questions and additional information, please call:  Yameli Delamater A. Chima Astorino, BSN, RN-BC [facility] Liaison Cell: [phone]

## 2018-08-07 NOTE — H&P (Addendum)
Physical Medicine and Rehabilitation Admission H&P     CC: R-AKA due to dehiscence of wound.      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 He developed dehiscence of wound as well as superficial abscess left heel and ulceration left ankle. He was admitted on 08/04/18 for revision to R-AKA by Dr. Sharol Given. Post op wound VAC to stay in place for a week. HD ongoing MWF and anemia of chronic disease stable on ESA.Gabapentin added today to help with pain management.  Therapy evaluations completed and CIR recommended due to deficits in mobility and ADLs.      Review of Systems  Constitutional: Negative for chills and fever.  HENT: Negative for hearing loss and tinnitus.   Eyes: Negative for blurred vision and double vision.  Respiratory: Negative for cough and hemoptysis.   Cardiovascular: Negative for chest pain and palpitations.  Gastrointestinal: Negative for abdominal pain and heartburn.  Genitourinary: Negative for dysuria and urgency.  Musculoskeletal: Negative for myalgias and neck pain.  Neurological: Negative for dizziness and headaches.            Past Medical History:  Diagnosis Date  . Arthritis    . Congestive heart failure (CHF) ([place])    . Depression    . Diabetes mellitus without complication (HCC)      insulin dependent  . Diabetic gastroparesis ([place]) 11/06/2017  . ESRD (end stage renal disease) on dialysis ([place])    . H/O seasonal allergies    . Hypertension    . Pneumonia    . Sleep apnea             Past Surgical History:  Procedure Laterality Date  . AMPUTATION Right 05/14/2018    Procedure: AMPUTATION BELOW KNEE;  Surgeon: Newt Minion, MD;  Location: [facility];  Service: Orthopedics;  Laterality: Right;  . AMPUTATION Right 08/04/2018    Procedure: RIGHT ABOVE KNEE AMPUTATION;  Surgeon: Newt Minion, MD;  Location: [facility];  Service: Orthopedics;  Laterality: Right;  . ESOPHAGOGASTRODUODENOSCOPY (EGD) WITH PROPOFOL N/A 12/05/2017    Procedure: ESOPHAGOGASTRODUODENOSCOPY (EGD) WITH PROPOFOL;  Surgeon: Doran Stabler, MD;  Location: [facility];  Service: Gastroenterology;  Laterality: N/A;  . HERNIA REPAIR      . IR AV DIALY SHUNT INTRO NEEDLE/INTRACATH INITIAL W/PTA/IMG LEFT   03/30/2017           Family History  Problem Relation Age of Onset  . Diabetes Mother    . Hypertension Father        Social History:  Married.  reports that he has never smoked. He has never used smokeless tobacco. He reports that he does not drink alcohol or use drugs.      Allergies: No Known Allergies            Medications Prior to Admission  Medication Sig Dispense Refill  . brimonidine (ALPHAGAN) 0.2 % ophthalmic solution Place 1 drop into both eyes 2 (two) times daily.      . calcium acetate (PHOSLO) 667 MG capsule Take 2,001 mg by mouth 3 (three) times daily with meals.       . docusate sodium (COLACE) 100 MG capsule Take 100 mg by mouth 2 (two) times daily.      Marland Kitchen doxycycline (VIBRA-TABS) 100 MG tablet Take 1 tablet (100  mg total) by mouth 2 (two) times daily. 28 tablet 0  . fluticasone (FLONASE) 50 MCG/ACT nasal spray Place 1 spray into both nostrils daily.      . insulin lispro (ADMELOG SOLOSTAR) 100 UNIT/ML KwikPen Inject 1-9 Units into the skin 3 (three) times daily. If bs is 121-150=1 unit, 151-200=2 units, 201-250=3 units, 251-300=5 units, 301-350=7 units, 351-400=9 units      . latanoprost (XALATAN) 0.005 % ophthalmic solution Place 1 drop into both eyes at bedtime.      Marland Kitchen loratadine (CLARITIN) 10 MG tablet Take 10 mg by mouth every evening.      . Melatonin 3 MG CAPS Take 3 mg by mouth at bedtime.      . metoCLOPramide (REGLAN) 5 MG tablet Take 1 tablet (5 mg total) by mouth 3 (three) times daily before meals. 90 tablet 0  . metoprolol succinate (TOPROL-XL) 50 MG 24 hr tablet Take 1 tablet (50 mg total) by  mouth at bedtime. Take with or immediately following a meal.      . omeprazole (PRILOSEC) 20 MG capsule Take 20 mg by mouth at bedtime.      Marland Kitchen oxyCODONE 10 MG TABS Take 1-1.5 tablets (10-15 mg total) by mouth every 4 (four) hours as needed for severe pain (pain score 7-10). (Patient taking differently: Take 10 mg by mouth every 4 (four) hours as needed (pain). ) 15 tablet 0  . sevelamer carbonate (RENVELA) 800 MG tablet TAKE 1 TABLET BY MOUTH DAILY WITH MEALS FOR HYPOCALCEMIA (Patient taking differently: Take 800 mg by mouth 3 (three) times daily with meals. ) 270 tablet 0  . silver sulfADIAZINE (SILVADENE) 1 % cream Apply 1 application topically daily. Apply to left foot and ankle      . timolol (BETIMOL) 0.5 % ophthalmic solution Place 1 drop into both eyes 2 (two) times daily. 10 mL 1  . albuterol (PROVENTIL HFA;VENTOLIN HFA) 108 (90 Base) MCG/ACT inhaler Inhale 1 puff into the lungs every 4 (four) hours as needed for wheezing or shortness of breath.       . ondansetron (ZOFRAN) 4 MG tablet Take 1 tablet (4 mg total) by mouth every 8 (eight) hours as needed for nausea or vomiting. 12 tablet 0      Drug Regimen Review  Drug regimen was reviewed and remains appropriate with no significant issues identified   Home: Home Living Family/patient expects to be discharged to:: Private residence Living Arrangements: Spouse/significant other Available Help at Discharge: Family, Available PRN/intermittently Type of Home: Apartment Home Access: Stairs to enter Technical brewer of Steps: 14 Home Layout: One level Bathroom Shower/Tub: Chiropodist: Standard Home Equipment: Environmental consultant - 2 wheels   Functional History: Prior Function Level of Independence: Needs assistance Gait / Transfers Assistance Needed: can get in/out of w/c by himself; ambulates short distances with RW; able to propel himself when in w/c ADL's / Homemaking Assistance Needed: requires setup assist for ADLs    Functional Status:  Mobility: Bed Mobility Overal bed mobility: Needs Assistance Bed Mobility: Supine to Sit Supine to sit: Min guard Sit to supine: Min guard General bed mobility comments: OOB upon entry  Transfers Overall transfer level: Needs assistance Equipment used: Rolling walker (2 wheeled) Transfers: Sit to/from Stand Sit to Stand: Mod assist Stand pivot transfers: Min assist General transfer comment: mod assist to ascend from recliner with cueing for hand placement and safety  Ambulation/Gait General Gait Details: pt able to take a few hops on L LE with  RW during pivot to chair   ADL: ADL Overall ADL's : Needs assistance/impaired Grooming: Set up, Sitting Upper Body Bathing: Set up, Sitting Lower Body Bathing: Moderate assistance, Sit to/from stand Lower Body Bathing Details (indicate cue type and reason): decreased functional reach to L LE, mod assist sit<>stand  Upper Body Dressing : Set up, Sitting Lower Body Dressing: Moderate assistance, Sit to/from stand Lower Body Dressing Details (indicate cue type and reason): decreased functional reach to L LE, mod assist sit<>stand and reliant on B UE support in standing  Toilet Transfer Details (indicate cue type and reason): pt declined, sit to stand from recliner with mod assist  Toileting- Clothing Manipulation and Hygiene: Moderate assistance, Sit to/from stand Functional mobility during ADLs: Moderate assistance, Rolling walker, Cueing for safety, Cueing for sequencing General ADL Comments: pt limited by pain, weakness and impaired balance    Cognition: Cognition Overall Cognitive Status: Impaired/Different from baseline Orientation Level: Oriented X4 Cognition Arousal/Alertness: Lethargic Behavior During Therapy: Flat affect Overall Cognitive Status: Impaired/Different from baseline Area of Impairment: Memory, Following commands, Safety/judgement, Awareness, Problem solving Memory: Decreased short-term memory,  Decreased recall of precautions Following Commands: Follows one step commands inconsistently, Follows one step commands with increased time Safety/Judgement: Decreased awareness of deficits, Decreased awareness of safety Awareness: Intellectual Problem Solving: Slow processing, Decreased initiation, Difficulty sequencing, Requires verbal cues     Blood pressure 111/73, pulse 87, temperature 98.8 F (37.1 C), temperature source Oral, resp. rate 18, height 6' (1.829 m), weight 94.7 kg, SpO2 98 %. Physical Exam  Nursing note and vitals reviewed. Constitutional: He is oriented to person, place, and time. He appears well-developed and well-nourished. No distress.  obese  HENT:  Head: Normocephalic and atraumatic.  Eyes: Pupils are equal, round, and reactive to light. EOM are normal.  Neck: Normal range of motion. No thyromegaly present.  Cardiovascular: Normal rate and regular rhythm. Exam reveals no friction rub.  No murmur heard. Respiratory: Effort normal and breath sounds normal. No respiratory distress. He has no wheezes.  GI: Soft. He exhibits no distension. There is no abdominal tenderness.  Musculoskeletal:     Comments: R-AKA with wound VAC in place. Tender to touch and with ROM. Dry dressing on left shin as well as ankle/foot. Necrotic ulcer right 5th MCP.   Neurological: He is alert and oriented to person, place, and time. No cranial nerve deficit. He exhibits normal muscle tone. Coordination normal.  BUE 5/5. RLE: can lift against gravity. LLE: 3/5 prox to distal with submaximal effort. Decreased sensation to LT in distal LLE.   Skin: Skin is warm and dry.  Psychiatric: Judgment and thought content normal.  Flat and disengaged      Lab Results Last 48 Hours        Results for orders placed or performed during the hospital encounter of 08/04/18 (from the past 48 hour(s))  Glucose, capillary     Status: Abnormal    Collection Time: 08/05/18 12:51 PM  Result Value Ref Range     Glucose-Capillary 227 (H) 70 - 99 mg/dL  Glucose, capillary     Status: Abnormal    Collection Time: 08/05/18  5:42 PM  Result Value Ref Range    Glucose-Capillary 184 (H) 70 - 99 mg/dL  Glucose, capillary     Status: Abnormal    Collection Time: 08/05/18 10:40 PM  Result Value Ref Range    Glucose-Capillary 224 (H) 70 - 99 mg/dL  Glucose, capillary     Status: Abnormal  Collection Time: 08/06/18  7:36 AM  Result Value Ref Range    Glucose-Capillary 166 (H) 70 - 99 mg/dL  Glucose, capillary     Status: Abnormal    Collection Time: 08/06/18 12:06 PM  Result Value Ref Range    Glucose-Capillary 154 (H) 70 - 99 mg/dL  Glucose, capillary     Status: Abnormal    Collection Time: 08/06/18  5:17 PM  Result Value Ref Range    Glucose-Capillary 155 (H) 70 - 99 mg/dL  Glucose, capillary     Status: Abnormal    Collection Time: 08/06/18  9:30 PM  Result Value Ref Range    Glucose-Capillary 113 (H) 70 - 99 mg/dL  Glucose, capillary     Status: None    Collection Time: 08/07/18  6:44 AM  Result Value Ref Range    Glucose-Capillary 83 70 - 99 mg/dL  Renal function panel     Status: Abnormal    Collection Time: 08/07/18  7:27 AM  Result Value Ref Range    Sodium 128 (L) 135 - 145 mmol/L    Potassium 5.2 (H) 3.5 - 5.1 mmol/L    Chloride 89 (L) 98 - 111 mmol/L    CO2 23 22 - 32 mmol/L    Glucose, Bld 99 70 - 99 mg/dL    BUN 78 (H) 6 - 20 mg/dL    Creatinine, Ser 12.62 (H) 0.61 - 1.24 mg/dL    Calcium 8.6 (L) 8.9 - 10.3 mg/dL    Phosphorus 5.3 (H) 2.5 - 4.6 mg/dL    Albumin 2.3 (L) 3.5 - 5.0 g/dL    GFR calc non Af Amer 4 (L) >60 mL/min    GFR calc Af Amer 5 (L) >60 mL/min    Anion gap 16 (H) 5 - 15      Comment: Performed at [facility], [address], [place], [place] [address]  CBC     Status: Abnormal    Collection Time: 08/07/18  7:28 AM  Result Value Ref Range    WBC 9.8 4.0 - 10.5 K/uL    RBC 3.46 (L) 4.22 - 5.81 MIL/uL    Hemoglobin 10.1 (L) 13.0 - 17.0 g/dL    HCT  31.3 (L) 39.0 - 52.0 %    MCV 90.5 80.0 - 100.0 fL    MCH 29.2 26.0 - 34.0 pg    MCHC 32.3 30.0 - 36.0 g/dL    RDW 16.3 (H) 11.5 - 15.5 %    Platelets 228 150 - 400 K/uL    nRBC 0.0 0.0 - 0.2 %      Comment: Performed at [facility], [place] [address] [place], [place] [address]      Imaging Results (Last 48 hours)  No results found.           Medical Problem List and Plan: 1.  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  Antithrombotics: -DVT/anticoagulation:  Pharmaceutical: Heparin             -antiplatelet therapy: N/A 3. Pain Management: Gabapentin added today--will decrease to renal dose. Continue oxycodone prn.  4. Mood: LCSW to follow for evaluation and support.              -antipsychotic agents: N/A 5. Neuropsych: This patient is capable of making decisions on his own behalf. 6. Skin/Wound Care:  Continue VAC till 5/22. Pressure relief measures for LLE and local care to other wounds.  7. Fluids/Electrolytes/Nutrition: Strict I/O. Monitor weights daily.  8. T2DM: Continue to monitor BS ac/hs and use SSI for elevated BS.  9. ESRD: HD MWF. Schedule at end of the day to allow participation with therapies during the day and to assist with activity tolerance.  10. Diabetic gastroparesis: On Reglan tid ac. 11.  Anemia of chronic disease: Continue Aranesp weekly for supplement.   12. Glaucoma: Managed with Timoptic, Alphagan and Xalatan.      Post Admission Physician Evaluation: 1. Functional deficits secondary  to PAD, right AKA. 2. Patient is admitted to receive collaborative, interdisciplinary care between the physiatrist, rehab nursing staff, and therapy team. 3. Patient's level of medical complexity and substantial therapy needs in context of that medical necessity cannot be provided at a lesser intensity of care such as a SNF. 4. Patient has experienced substantial functional loss  from his/her baseline which was documented above under the "Functional History" and "Functional Status" headings.  Judging by the patient's diagnosis, physical exam, and functional history, the patient has potential for functional progress which will result in measurable gains while on inpatient rehab.  These gains will be of substantial and practical use upon discharge  in facilitating mobility and self-care at the household level. 5. Physiatrist will provide 24 hour management of medical needs as well as oversight of the therapy plan/treatment and provide guidance as appropriate regarding the interaction of the two. 6. The Preadmission Screening has been reviewed and patient status is unchanged unless otherwise stated above. 7. 24 hour rehab nursing will assist with bladder management, bowel management, safety, skin/wound care, disease management, medication administration, pain management and patient education  and help integrate therapy concepts, techniques,education, etc. 8. PT will assess and treat for/with: Lower extremity strength, range of motion, stamina, balance, functional mobility, safety, adaptive techniques and equipment, pre-prosthetic education.   Goals are: supervision to mod I  9. OT will assess and treat for/with: ADL's, functional mobility, safety, upper extremity strength, adaptive techniques and equipment, pain mgt, ego support.   Goals are: supervision to mod I. Therapy may not yet proceed with showering this patient. 10. SLP will assess and treat for/with: n/a.  Goals are: n/a. 11. Case Management and Social Worker will assess and treat for psychological issues and discharge planning. 12. Team conference will be held weekly to assess progress toward goals and to determine barriers to discharge. 13. Patient will receive at least 3 hours of therapy per day at least 5 days per week. 14. ELOS: 12-14 days       15. Prognosis:  excellent   I have personally performed a face to face  diagnostic evaluation of this patient and formulated the key components of the plan.  Additionally, I have personally reviewed laboratory data, imaging studies, as well as relevant notes and concur with the physician assistant's documentation above.  Meredith Staggers, MD, [place], PA-C 08/07/2018

## 2018-08-07 NOTE — Progress Notes (Signed)
Inpatient Rehab Admissions:  Inpatient Rehab Consult received.  I met with patient at the bedside for rehabilitation assessment and to discuss goals and expectations of an inpatient rehab admission.  I was also able to speak with his wife re: 24/7 assist and discharge environment.  Per wife, she and landlord have worked out level entry apartment to be available in the next 1-2 weeks.  She is able to provide 24/7 assist and also has a 51 y/o daughter who is home from school and can provide assist if needed.  I have approval from Dr. Sharol Given for patient to admit to CIR today. I will alert pt/family, RN, and CM.   Signed: Shann Medal, PT, DPT Admissions Coordinator [phone] 08/07/18  11:56 AM

## 2018-08-07 NOTE — PMR Pre-admission (Signed)
PMR Admission Coordinator Pre-Admission Assessment  Patient: Howard Bell is an 53 y.o., male MRN: [id number] DOB: April 18, 1965 Height: 6' (182.9 cm) Weight: 94.7 kg  Insurance Information HMO:     PPO:      PCP:      IPA:      80/20:      OTHER:  PRIMARY: Medicare A and B      Policy#: [id number]      Subscriber: patient Pre-Cert#: verified via passport onesource online      Eff. Date: Part A 09/19/2004, Part B 08/20/2005     Deduct: $1408      Out of Pocket Max: n/a      Life Max: n/a CIR: 100%      SNF: 36 days at $176/day Outpatient: 100%     Co-Pay:  Home Health: 100%      Co-Pay:  DME: 80%     Co-Pay: 20% Providers: patient choice  SECONDARY:       Policy#:       Subscriber:  CM Name:       Phone#:      Fax#:  Pre-Cert#:       Employer:  Benefits:  Phone #:      Name:  Eff. Date:      Deduct:       Out of Pocket Max:       Life Max:  CIR:       SNF:  Outpatient:      Co-Pay:  Home Health:       Co-Pay:  DME:      Co-Pay:   Medicaid Application Date:       Case Manager:  Disability Application Date:       Case Worker:   The "Data Collection Information Summary" for patients in Inpatient Rehabilitation Facilities with attached "Privacy Act [place] Records" was provided and verbally reviewed with: Family  Emergency Contact Information Contact Information    Name Relation Home Work Mobile   Sarin,[name] Spouse   [phone]      Current Medical History  Patient Admitting Diagnosis: R AKA  History of Present Illness: Pt is a 53 y/o male with PMH of ESRD (HD MWF), DM type 2, HTN, prior CVA and OSA.  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  Per wife, pt had fallen multiple times at [facility]  F/U with Dr. Sharol Given on 5/12, staples still present, pt not wearing shrinker, new dehiscence of R residual limb, also noted for new ischemic ulceration to posterior aspect of the L ankle and superficial abscess of dorsal lateral  aspect of L foot.  Return to [facility] for R BKA to AKA revision on 08/04/2018 with Dr. Sharol Given with wound vac application for 1 week.  Hospital course pain management.  Therapy evaluations completed and recommendations for CIR made.      Patient's medical record from [facility] has been reviewed by the rehabilitation admission coordinator and physician.  Past Medical History  Past Medical History:  Diagnosis Date  . Arthritis   . Congestive heart failure (CHF) ([place])   . Depression   . 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 Diabetic gastroparesis ([place]) 11/06/2017  . ESRD (end stage renal disease) on dialysis ([place])   . H/O seasonal allergies   . Hypertension   . Pneumonia   . Sleep apnea     Family History  family history includes Diabetes in his mother; Hypertension in his father.  Prior Rehab/Hospitalizations Has the patient had prior rehab or hospitalizations prior to admission? Yes, inpatient in Feb 2020, SNF from then until readmission 08/04/18  Has the patient had major surgery during 100 days prior to admission? Yes,  R BKA 04/2018, R AKA 07/2018  Current Medications  Current Facility-Administered Medications:  .  acetaminophen (TYLENOL) tablet 325-650 mg, 325-650 mg, Oral, Q6H PRN, Rayburn, Shawn Montgomery, PA-C .  albuterol (PROVENTIL) (2.5 MG/3ML) 0.083% nebulizer solution 3 mL, 3 mL, Inhalation, Q4H PRN, Rayburn, Shawn Montgomery, PA-C .  brimonidine (ALPHAGAN) 0.2 % ophthalmic solution 1 drop, 1 drop, Both Eyes, BID, Rayburn, Shawn Montgomery, PA-C, 1 drop at 08/06/18 2306 .  calcium acetate (PHOSLO) capsule 2,001 mg, 2,001 mg, Oral, TID WC, Rayburn, Shawn Montgomery, PA-C, 2,001 mg at 08/06/18 1810 .  Chlorhexidine Gluconate Cloth 2 % PADS 6 each, 6 each, Topical, Q0600, Newt Minion, MD .  Darbepoetin Alfa (ARANESP) injection 60 mcg, 60 mcg, Intravenous, Q Mon-HD, Corliss Parish, MD, 60 mcg at 08/07/18 1016 .  docusate  sodium (COLACE) capsule 100 mg, 100 mg, Oral, BID, Rayburn, Shawn Montgomery, PA-C, 100 mg at 08/06/18 2305 .  fluticasone (FLONASE) 50 MCG/ACT nasal spray 1 spray, 1 spray, Each Nare, Daily, Rayburn, Shawn Montgomery, PA-C, 1 spray at 08/06/18 1021 .  gabapentin (NEURONTIN) capsule 300 mg, 300 mg, Oral, BID, Newt Minion, MD .  HYDROmorphone (DILAUDID) injection 0.5-1 mg, 0.5-1 mg, Intravenous, Q4H PRN, Rayburn, Shawn Montgomery, PA-C .  insulin aspart (novoLOG) injection 0-5 Units, 0-5 Units, Subcutaneous, QHS, Rayburn, Neta Mends, PA-C, 2 Units at 08/05/18 2319 .  insulin aspart (novoLOG) injection 0-9 Units, 0-9 Units, Subcutaneous, TID WC, Rayburn, Neta Mends, PA-C, 2 Units at 08/06/18 1810 .  lactated ringers infusion, , Intravenous, Continuous, Rayburn, Neta Mends, PA-C, Stopped at 08/04/18 1835 .  latanoprost (XALATAN) 0.005 % ophthalmic solution 1 drop, 1 drop, Both Eyes, QHS, Rayburn, Shawn Montgomery, PA-C, 1 drop at 08/06/18 2307 .  loratadine (CLARITIN) tablet 10 mg, 10 mg, Oral, QPM, Rayburn, Shawn Montgomery, PA-C, 10 mg at 08/06/18 1809 .  metoCLOPramide (REGLAN) tablet 5-10 mg, 5-10 mg, Oral, Q8H PRN **OR** metoCLOPramide (REGLAN) injection 5-10 mg, 5-10 mg, Intravenous, Q8H PRN, Rayburn, Shawn Montgomery, PA-C .  metoCLOPramide (REGLAN) tablet 5 mg, 5 mg, Oral, TID AC, Rayburn, Shawn Montgomery, PA-C, 5 mg at 08/06/18 1319 .  metoprolol succinate (TOPROL-XL) 24 hr tablet 50 mg, 50 mg, Oral, QHS, Rayburn, Shawn Montgomery, PA-C, 50 mg at 08/06/18 2305 .  mupirocin ointment (BACTROBAN) 2 % 1 application, 1 application, Nasal, BID, Newt Minion, MD, 1 application at 16/10/96 2305 .  ondansetron (ZOFRAN) tablet 4 mg, 4 mg, Oral, Q6H PRN **OR** ondansetron (ZOFRAN) injection 4 mg, 4 mg, Intravenous, Q6H PRN, Rayburn, Neta Mends, PA-C .  oxyCODONE (Oxy IR/ROXICODONE) immediate release tablet 10-15 mg, 10-15 mg, Oral, Q4H PRN, Rayburn, Neta Mends, PA-C, 15  mg at 08/06/18 2006 .  oxyCODONE (Oxy IR/ROXICODONE) immediate release tablet 5-10 mg, 5-10 mg, Oral, Q4H PRN, Rayburn, Neta Mends, PA-C, 10 mg at 08/05/18 2224 .  pantoprazole (PROTONIX) EC tablet 40 mg, 40 mg, Oral, Daily, Rayburn, Shawn Montgomery, PA-C, 40 mg at 08/06/18 0914 .  sevelamer carbonate (RENVELA) tablet 800 mg, 800 mg, Oral, TID WC, Rayburn, Shawn Montgomery, PA-C, 800 mg at 08/06/18 1809 .  timolol (TIMOPTIC) 0.5 % ophthalmic solution 1 drop, 1 drop, Both Eyes, BID, Rayburn, Shawn Montgomery, PA-C, 1 drop at  08/06/18 2305  Patients Current Diet:  Diet Order            Diet Carb Modified Fluid consistency: Thin; Room service appropriate? Yes  Diet effective now              Precautions / Restrictions Precautions Precautions: Fall Precaution Comments: wound VAC Restrictions Weight Bearing Restrictions: No   Has the patient had 2 or more falls or a fall with injury in the past year? Yes  Prior Activity Level Community (5-7x/wk): out 3x/week for HD, also went on walks, etc for exercise.  Still driving some  Prior Functional Level Self Care: Did the patient need help bathing, dressing, using the toilet or eating? Independent  Indoor Mobility: Did the patient need assistance with walking from room to room (with or without device)? Independent  Stairs: Did the patient need assistance with internal or external stairs (with or without device)? Independent  Functional Cognition: Did the patient need help planning regular tasks such as shopping or remembering to take medications? Independent  Home Assistive Devices / Equipment Home Assistive Devices/Equipment: CBG Meter, Oxygen, Wheelchair (transport), tub bench Home Equipment: Walker - 2 wheels  Prior Device Use: Indicate devices/aids used by the patient prior to current illness, exacerbation or injury? None of the above  Current Functional Level Cognition  Overall Cognitive Status: Impaired/Different from  baseline Orientation Level: Oriented X4 Following Commands: Follows one step commands inconsistently, Follows one step commands with increased time Safety/Judgement: Decreased awareness of deficits, Decreased awareness of safety    Extremity Assessment (includes Sensation/Coordination)  Upper Extremity Assessment: Overall WFL for tasks assessed  Lower Extremity Assessment: Defer to PT evaluation RLE Deficits / Details: wound VAC in place; pt able to perform hip flexion against gravity x1  RLE: Unable to fully assess due to pain    ADLs  Overall ADL's : Needs assistance/impaired Grooming: Set up, Sitting Upper Body Bathing: Set up, Sitting Lower Body Bathing: Moderate assistance, Sit to/from stand Lower Body Bathing Details (indicate cue type and reason): decreased functional reach to L LE, mod assist sit<>stand  Upper Body Dressing : Set up, Sitting Lower Body Dressing: Moderate assistance, Sit to/from stand Lower Body Dressing Details (indicate cue type and reason): decreased functional reach to L LE, mod assist sit<>stand and reliant on B UE support in standing  Toilet Transfer Details (indicate cue type and reason): pt declined, sit to stand from recliner with mod assist  Toileting- Clothing Manipulation and Hygiene: Moderate assistance, Sit to/from stand Functional mobility during ADLs: Moderate assistance, Rolling walker, Cueing for safety, Cueing for sequencing General ADL Comments: pt limited by pain, weakness and impaired balance     Mobility  Overal bed mobility: Needs Assistance Bed Mobility: Supine to Sit Supine to sit: Min guard Sit to supine: Min guard General bed mobility comments: OOB upon entry     Transfers  Overall transfer level: Needs assistance Equipment used: Rolling walker (2 wheeled) Transfers: Sit to/from Stand Sit to Stand: Mod assist Stand pivot transfers: Min assist General transfer comment: mod assist to ascend from recliner with cueing for hand  placement and safety     Ambulation / Gait / Stairs / Wheelchair Mobility  Ambulation/Gait General Gait Details: pt able to take a few hops on L LE with RW during pivot to chair    Posture / Balance Balance Overall balance assessment: Needs assistance Sitting-balance support: No upper extremity supported Sitting balance-Leahy Scale: Fair Standing balance support: Bilateral upper extremity supported, During functional activity  Standing balance-Leahy Scale: Poor Standing balance comment: relaint on B UE support    Special needs/care consideration BiPAP/CPAP has a CPAP, SNF says it stopped working  CPM no Continuous Drip IV no Dialysis yes        Days MWF Life Vest  no Oxygen no Special Bed no Trach Size no Wound Vac (area) yes      Location R AKA Skin incision to R AKA, wounds to L foot (dorsal aspect), and L ankle Bowel mgmt: continent Bladder mgmt: anuric Diabetic mgmt: yes Behavioral consideration no Chemo/radiation no   Previous Home Environment (from acute therapy documentation) Living Arrangements: Spouse/significant other Available Help at Discharge: Family, Available PRN/intermittently Type of Home: Apartment Home Layout: One level Home Access: Stairs to enter Technical brewer of Steps: 14 Bathroom Shower/Tub: Chiropodist: Standard Home Care Services: No  Discharge Living Setting Plans for Discharge Living Setting: Patient's home, Apartment Type of Home at Discharge: Apartment Discharge Home Layout: Other (Comment)(currently 2nd floor, wife confirmed will have 1st floor in the next 2 weeks) Discharge Home Access: Level entry Discharge Bathroom Shower/Tub: Tub/shower unit Discharge Bathroom Toilet: Standard Discharge Bathroom Accessibility: Yes How Accessible: Accessible via walker Does the patient have any problems obtaining your medications?: No  Social/Family/Support Systems Patient Roles: Spouse, Parent Anticipated Caregiver:  spouse, Kenney Houseman Anticipated Caregiver's Contact Information: [phone] Ability/Limitations of Caregiver: not currently working 2/2 COVID, also has a 31 y/o daughter who is out of school currently Caregiver Availability: 24/7 Discharge Plan Discussed with Primary Caregiver: Yes Is Caregiver In Agreement with Plan?: Yes Does Caregiver/Family have Issues with Lodging/Transportation while Pt is in Rehab?: No  Goals/Additional Needs Patient/Family Goal for Rehab: PT/OT supervision to mod I Expected length of stay: 12-14 days Cultural Considerations: n/a Dietary Needs: carb modified, thin Equipment Needs: tbd Special Service Needs: HD MWF Pt/Family Agrees to Admission and willing to participate: Yes Program Orientation Provided & Reviewed with Pt/Caregiver Including Roles  & Responsibilities: Yes   Possible need for SNF placement upon discharge: no  Patient Condition: I have reviewed medical records from [facility], spoken with CSW, and patient and spouse. I met with patient at the bedside and discussed with wife via phone for inpatient rehabilitation assessment.  Patient will benefit from ongoing PT and OT, can actively participate in 3 hours of therapy a day 5 days of the week, and can make measurable gains during the admission.  Patient will also benefit from the coordinated team approach during an Inpatient Acute Rehabilitation admission.  The patient will receive intensive therapy as well as Rehabilitation physician, nursing, social worker, and care management interventions.  Due to safety, skin/wound care, disease management, medication administration, pain management and patient education the patient requires 24 hour a day rehabilitation nursing.  The patient is currently min/mod with mobility and basic ADLs.  Discharge setting and therapy post discharge at home with home health is anticipated.  Patient has agreed to participate in the Acute Inpatient Rehabilitation Program and will  admit today.  Preadmission Screen Completed By:  Michel Santee, PT, DPT 08/07/2018 11:08 AM ______________________________________________________________________   Discussed status with Dr. Naaman Plummer on 08/07/18  at 11:33 AM  and received approval for admission today.  Admission Coordinator:  Michel Santee, PT, DPT time 11:33 AM Sudie Grumbling 08/07/18    Assessment/Plan: Diagnosis: Right AKA 1. Does the need for close, 24 hr/day Medical supervision in concert with the patient's rehab needs make it unreasonable for this patient to be served in a  less intensive setting? Yes 2. Co-Morbidities requiring supervision/potential complications: ESRD, DM2, HTN, OSA, PVD 3. Due to bladder management, bowel management, safety, skin/wound care, disease management, medication administration, pain management and patient education, does the patient require 24 hr/day rehab nursing? Yes 4. Does the patient require coordinated care of a physician, rehab nurse, PT (1-2 hrs/day, 5 days/week) and OT (1-2 hrs/day, 5 days/week) to address physical and functional deficits in the context of the above medical diagnosis(es)? Yes Addressing deficits in the following areas: balance, endurance, locomotion, strength, transferring, bowel/bladder control, bathing, dressing, feeding, grooming, toileting and psychosocial support 5. Can the patient actively participate in an intensive therapy program of at least 3 hrs of therapy 5 days a week? Yes 6. The potential for patient to make measurable gains while on inpatient rehab is excellent 7. Anticipated functional outcomes upon discharge from inpatients are: modified independent and supervision PT, modified independent and supervision OT, n/a SLP 8. Estimated rehab length of stay to reach the above functional goals is: 12-14 days 9. Anticipated D/C setting: Home 10. Anticipated post D/C treatments: [place] therapy 11. Overall Rehab/Functional Prognosis: excellent  MD Signature: Meredith Staggers, MD, [facility] 08/07/2018

## 2018-08-07 NOTE — Progress Notes (Signed)
Subjective:  Progressing well, possibly to inpatient rehab this admit ?  Some inc in phantom pain- Dr. Sharol Given inc neurontin- seen on HD  Objective Vital signs in last 24 hours: Vitals:   08/07/18 0718 08/07/18 0730 08/07/18 0800 08/07/18 0830  BP: (!) 164/88 (!) 148/80 (!) 154/80 121/67  Pulse: 79 80 84 (!) 58  Resp:      Temp:      TempSrc:      SpO2:      Weight:      Height:       Weight change:   Intake/Output Summary (Last 24 hours) at 08/07/2018 0857 Last data filed at 08/06/2018 1800 Gross per 24 hour  Intake 222 ml  Output 0 ml  Net 222 ml    Dialysis Orders:  [place] 4.25h BFR 450/2x EDW 95.5kg 2K/2.25Ca UFP 2 Hep bolus 9000 U Parsabiv 10mg  TIW Mircera 150 mcg IV q 2 weeks (last 5/4)  Assessment/Plan: 1. 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 Sharol Given.  Pain control and rehab  2. ESRD -  Usually HD MWF. HD on schedule-  2K bath. Will resume some tight heparin  3. Hypertension/volume  - BP elevated likely with volume, fine after HD. Will need lower EDW at discharge 4. Anemia  - Hgb 10.1 - gave darbe 5/18.  5. Metabolic bone disease -  Continue Ca acetate +Renvela as binder. Parsabiv not available in hospital. Follow labs.  6. DM Type 2 -insulin per primary       [place]: Basic Metabolic Panel: Recent Labs  Lab 08/04/18 0625 08/07/18 0727  NA 130* 128*  K 4.2 5.2*  CL  --  89*  CO2  --  23  GLUCOSE 132* 99  BUN  --  78*  CREATININE  --  12.62*  CALCIUM  --  8.6*  PHOS  --  5.3*   Liver Function Tests: Recent Labs  Lab 08/07/18 0727  ALBUMIN 2.3*   No results for input(s): LIPASE, AMYLASE in the last 168 hours. No results for input(s): AMMONIA in the last 168 hours. CBC: Recent Labs  Lab 08/04/18 0625 08/07/18 0728  WBC  --  9.8  HGB 10.9* 10.1*  HCT 32.0* 31.3*  MCV  --  90.5  PLT  --  228   Cardiac Enzymes: No results for input(s): CKTOTAL, CKMB, CKMBINDEX, TROPONINI in the last 168 hours. CBG: Recent  Labs  Lab 08/06/18 0736 08/06/18 1206 08/06/18 1717 08/06/18 2130 08/07/18 0644  GLUCAP 166* 154* 155* 113* 83    Iron Studies: No results for input(s): IRON, TIBC, TRANSFERRIN, FERRITIN in the last 72 hours. Studies/Results: No results found. Medications: Infusions: . lactated ringers Stopped (08/04/18 1835)    Scheduled Medications: . brimonidine  1 drop Both Eyes BID  . calcium acetate  2,001 mg Oral TID WC  . Chlorhexidine Gluconate Cloth  6 each Topical Q0600  . darbepoetin (ARANESP) injection - DIALYSIS  60 mcg Intravenous Q Mon-HD  . docusate sodium  100 mg Oral BID  . fluticasone  1 spray Each Nare Daily  . gabapentin  300 mg Oral BID  . insulin aspart  0-5 Units Subcutaneous QHS  . insulin aspart  0-9 Units Subcutaneous TID WC  . latanoprost  1 drop Both Eyes QHS  . loratadine  10 mg Oral QPM  . metoCLOPramide  5 mg Oral TID AC  . metoprolol succinate  50 mg Oral QHS  . mupirocin ointment  1 application Nasal BID  . pantoprazole  40 mg Oral Daily  . sevelamer carbonate  800 mg Oral TID WC  . timolol  1 drop Both Eyes BID    have reviewed scheduled and prn medications.  Physical Exam: General: NAD Heart: RRR Lungs: mostly clear Abdomen: obese, soft, non tender Extremities: min edema Dialysis Access: left AVF     08/07/2018,8:57 AM  LOS: 3 days

## 2018-08-07 NOTE — Progress Notes (Signed)
Pt arrived /admitted from unit 6 [place] via bed to [facility] 5N17.  Pt oriented to room, call bell and room phone. Pt arrived with wound  VAC at 113mmHg,  to R AKA.  Pt denied pain at this time, noted he received oxycodone 15mg  po at 1529 from unit [facility] prior to arrival. Any belongings were placed in pt's room closet by transferring NTs, not seen by admitting RN. Note pt has cell phone with charger in bed with him. Pt tired and did not want to be bothered at time of arrival, minimal assessment done at this time. Left foot wound had just been changed on unit [facility] at approx 1500 (see documentation by other unit), pt did not want removed at this time, placed [facility] consult to have wound(s) assessed and advise on care, no current orders for wound care nor did pt have  orders on unit [facility] for left foot wounds per receiving report.

## 2018-08-07 NOTE — Progress Notes (Signed)
Michel Santee, PT  Rehab Admission Coordinator  Physical Medicine and Rehabilitation  PMR Pre-admission  Cosign Needed  Date of Service:  08/07/2018 11:08 AM       Related encounter: Admission (Current) from 08/04/2018 in [place] Needed         Show:Clear all [x] Manual[x] Template[] Copied  Added by: [x] Meredith Staggers, MD[x] Michel Santee, PT  [] Hover for details PMR Admission Coordinator Pre-Admission Assessment  Patient: Howard Bell is an 53 y.o., male MRN: [id number] DOB: 09/14/1965 Height: 6' (182.9 cm) Weight: 94.7 kg  Insurance Information HMO:     PPO:      PCP:      IPA:      80/20:      OTHER:  PRIMARY: Medicare A and B      Policy#: [id number]      Subscriber: patient Pre-Cert#: verified via passport onesource online      Eff. Date: Part A 09/19/2004, Part B 08/20/2005     Deduct: $1408      Out of Pocket Max: n/a      Life Max: n/a CIR: 100%      SNF: 36 days at $176/day Outpatient: 100%     Co-Pay:  Home Health: 100%      Co-Pay:  DME: 80%     Co-Pay: 20% Providers: patient choice  SECONDARY:       Policy#:       Subscriber:  CM Name:       Phone#:      Fax#:  Pre-Cert#:       Employer:  Benefits:  Phone #:      Name:  Eff. Date:      Deduct:       Out of Pocket Max:       Life Max:  CIR:       SNF:  Outpatient:      Co-Pay:  Home Health:       Co-Pay:  DME:      Co-Pay:   Medicaid Application Date:       Case Manager:  Disability Application Date:       Case Worker:   The "Data Collection Information Summary" for patients in Inpatient Rehabilitation Facilities with attached "Privacy Act [place] Records" was provided and verbally reviewed with: Family  Emergency Contact Information         Contact Information    Name Relation Home Work Mobile   Mccalip,[name] Spouse   [phone]      Current Medical History  Patient Admitting Diagnosis: R AKA   History of Present Illness: Pt is a 53 y/o male with PMH of ESRD (HD MWF), DM type 2, HTN, prior CVA and OSA.  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  Per wife, pt had fallen multiple times at [facility].  F/U with Dr. Sharol Given on 5/12, staples still present, pt not wearing shrinker, new dehiscence of R residual limb, also noted for new ischemic ulceration to posterior aspect of the L ankle and superficial abscess of dorsal lateral aspect of L foot.  Return to [facility] for R BKA to AKA revision on 08/04/2018 with Dr. Sharol Given with wound vac application for 1 week.  Hospital course pain management.  Therapy evaluations completed and recommendations for CIR made.    Patient's medical record from [facility] has been reviewed by the rehabilitation  admission coordinator and physician.  Past Medical History      Past Medical History:  Diagnosis Date  . Arthritis   . Congestive heart failure (CHF) ([place])   . Depression   . 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 Diabetic gastroparesis ([place]) 11/06/2017  . ESRD (end stage renal disease) on dialysis ([place])   . H/O seasonal allergies   . Hypertension   . Pneumonia   . Sleep apnea     Family History   family history includes Diabetes in his mother; Hypertension in his father.  Prior Rehab/Hospitalizations Has the patient had prior rehab or hospitalizations prior to admission? Yes, inpatient in Feb 2020, SNF from then until readmission 08/04/18  Has the patient had major surgery during 100 days prior to admission? Yes,  R BKA 04/2018, R AKA 07/2018  Current Medications  Current Facility-Administered Medications:  .  acetaminophen (TYLENOL) tablet 325-650 mg, 325-650 mg, Oral, Q6H PRN, Rayburn, Shawn Montgomery, PA-C .  albuterol (PROVENTIL) (2.5 MG/3ML) 0.083% nebulizer solution 3 mL, 3 mL, Inhalation, Q4H PRN, Rayburn, Shawn Montgomery, PA-C .  brimonidine  (ALPHAGAN) 0.2 % ophthalmic solution 1 drop, 1 drop, Both Eyes, BID, Rayburn, Shawn Montgomery, PA-C, 1 drop at 08/06/18 2306 .  calcium acetate (PHOSLO) capsule 2,001 mg, 2,001 mg, Oral, TID WC, Rayburn, Shawn Montgomery, PA-C, 2,001 mg at 08/06/18 1810 .  Chlorhexidine Gluconate Cloth 2 % PADS 6 each, 6 each, Topical, Q0600, Newt Minion, MD .  Darbepoetin Alfa (ARANESP) injection 60 mcg, 60 mcg, Intravenous, Q Mon-HD, Corliss Parish, MD, 60 mcg at 08/07/18 1016 .  docusate sodium (COLACE) capsule 100 mg, 100 mg, Oral, BID, Rayburn, Shawn Montgomery, PA-C, 100 mg at 08/06/18 2305 .  fluticasone (FLONASE) 50 MCG/ACT nasal spray 1 spray, 1 spray, Each Nare, Daily, Rayburn, Shawn Montgomery, PA-C, 1 spray at 08/06/18 1021 .  gabapentin (NEURONTIN) capsule 300 mg, 300 mg, Oral, BID, Newt Minion, MD .  HYDROmorphone (DILAUDID) injection 0.5-1 mg, 0.5-1 mg, Intravenous, Q4H PRN, Rayburn, Shawn Montgomery, PA-C .  insulin aspart (novoLOG) injection 0-5 Units, 0-5 Units, Subcutaneous, QHS, Rayburn, Neta Mends, PA-C, 2 Units at 08/05/18 2319 .  insulin aspart (novoLOG) injection 0-9 Units, 0-9 Units, Subcutaneous, TID WC, Rayburn, Neta Mends, PA-C, 2 Units at 08/06/18 1810 .  lactated ringers infusion, , Intravenous, Continuous, Rayburn, Neta Mends, PA-C, Stopped at 08/04/18 1835 .  latanoprost (XALATAN) 0.005 % ophthalmic solution 1 drop, 1 drop, Both Eyes, QHS, Rayburn, Shawn Montgomery, PA-C, 1 drop at 08/06/18 2307 .  loratadine (CLARITIN) tablet 10 mg, 10 mg, Oral, QPM, Rayburn, Shawn Montgomery, PA-C, 10 mg at 08/06/18 1809 .  metoCLOPramide (REGLAN) tablet 5-10 mg, 5-10 mg, Oral, Q8H PRN **OR** metoCLOPramide (REGLAN) injection 5-10 mg, 5-10 mg, Intravenous, Q8H PRN, Rayburn, Shawn Montgomery, PA-C .  metoCLOPramide (REGLAN) tablet 5 mg, 5 mg, Oral, TID AC, Rayburn, Shawn Montgomery, PA-C, 5 mg at 08/06/18 1319 .  metoprolol succinate (TOPROL-XL) 24 hr tablet 50 mg, 50 mg,  Oral, QHS, Rayburn, Shawn Montgomery, PA-C, 50 mg at 08/06/18 2305 .  mupirocin ointment (BACTROBAN) 2 % 1 application, 1 application, Nasal, BID, Newt Minion, MD, 1 application at 36/14/43 2305 .  ondansetron (ZOFRAN) tablet 4 mg, 4 mg, Oral, Q6H PRN **OR** ondansetron (ZOFRAN) injection 4 mg, 4 mg, Intravenous, Q6H PRN, Rayburn, Neta Mends, PA-C .  oxyCODONE (Oxy IR/ROXICODONE) immediate release tablet 10-15 mg, 10-15 mg, Oral, Q4H PRN, Rayburn, Neta Mends, PA-C, 15 mg at 08/06/18  2006 .  oxyCODONE (Oxy IR/ROXICODONE) immediate release tablet 5-10 mg, 5-10 mg, Oral, Q4H PRN, Rayburn, Neta Mends, PA-C, 10 mg at 08/05/18 2224 .  pantoprazole (PROTONIX) EC tablet 40 mg, 40 mg, Oral, Daily, Rayburn, Shawn Montgomery, PA-C, 40 mg at 08/06/18 0914 .  sevelamer carbonate (RENVELA) tablet 800 mg, 800 mg, Oral, TID WC, Rayburn, Shawn Montgomery, PA-C, 800 mg at 08/06/18 1809 .  timolol (TIMOPTIC) 0.5 % ophthalmic solution 1 drop, 1 drop, Both Eyes, BID, Rayburn, Shawn Montgomery, PA-C, 1 drop at 08/06/18 2305  Patients Current Diet:     Diet Order                  Diet Carb Modified Fluid consistency: Thin; Room service appropriate? Yes  Diet effective now               Precautions / Restrictions Precautions Precautions: Fall Precaution Comments: wound VAC Restrictions Weight Bearing Restrictions: No   Has the patient had 2 or more falls or a fall with injury in the past year? Yes  Prior Activity Level Community (5-7x/wk): out 3x/week for HD, also went on walks, etc for exercise.  Still driving some  Prior Functional Level Self Care: Did the patient need help bathing, dressing, using the toilet or eating? Independent  Indoor Mobility: Did the patient need assistance with walking from room to room (with or without device)? Independent  Stairs: Did the patient need assistance with internal or external stairs (with or without device)? Independent   Functional Cognition: Did the patient need help planning regular tasks such as shopping or remembering to take medications? Independent  Home Assistive Devices / Equipment Home Assistive Devices/Equipment: CBG Meter, Oxygen, Wheelchair (transport), tub bench Home Equipment: Walker - 2 wheels  Prior Device Use: Indicate devices/aids used by the patient prior to current illness, exacerbation or injury? None of the above  Current Functional Level Cognition  Overall Cognitive Status: Impaired/Different from baseline Orientation Level: Oriented X4 Following Commands: Follows one step commands inconsistently, Follows one step commands with increased time Safety/Judgement: Decreased awareness of deficits, Decreased awareness of safety    Extremity Assessment (includes Sensation/Coordination)  Upper Extremity Assessment: Overall WFL for tasks assessed  Lower Extremity Assessment: Defer to PT evaluation RLE Deficits / Details: wound VAC in place; pt able to perform hip flexion against gravity x1  RLE: Unable to fully assess due to pain    ADLs  Overall ADL's : Needs assistance/impaired Grooming: Set up, Sitting Upper Body Bathing: Set up, Sitting Lower Body Bathing: Moderate assistance, Sit to/from stand Lower Body Bathing Details (indicate cue type and reason): decreased functional reach to L LE, mod assist sit<>stand  Upper Body Dressing : Set up, Sitting Lower Body Dressing: Moderate assistance, Sit to/from stand Lower Body Dressing Details (indicate cue type and reason): decreased functional reach to L LE, mod assist sit<>stand and reliant on B UE support in standing  Toilet Transfer Details (indicate cue type and reason): pt declined, sit to stand from recliner with mod assist  Toileting- Clothing Manipulation and Hygiene: Moderate assistance, Sit to/from stand Functional mobility during ADLs: Moderate assistance, Rolling walker, Cueing for safety, Cueing for sequencing General  ADL Comments: pt limited by pain, weakness and impaired balance     Mobility  Overal bed mobility: Needs Assistance Bed Mobility: Supine to Sit Supine to sit: Min guard Sit to supine: Min guard General bed mobility comments: OOB upon entry     Transfers  Overall transfer level: Needs assistance Equipment  used: Rolling walker (2 wheeled) Transfers: Sit to/from Stand Sit to Stand: Mod assist Stand pivot transfers: Min assist General transfer comment: mod assist to ascend from recliner with cueing for hand placement and safety     Ambulation / Gait / Stairs / Wheelchair Mobility  Ambulation/Gait General Gait Details: pt able to take a few hops on L LE with RW during pivot to chair    Posture / Balance Balance Overall balance assessment: Needs assistance Sitting-balance support: No upper extremity supported Sitting balance-Leahy Scale: Fair Standing balance support: Bilateral upper extremity supported, During functional activity Standing balance-Leahy Scale: Poor Standing balance comment: relaint on B UE support    Special needs/care consideration BiPAP/CPAP has a CPAP, SNF says it stopped working  CPM no Continuous Drip IV no Dialysis yes        Days MWF Life Vest  no Oxygen no Special Bed no Trach Size no Wound Vac (area) yes      Location R AKA Skin incision to R AKA, wounds to L foot (dorsal aspect), and L ankle Bowel mgmt: continent Bladder mgmt: anuric Diabetic mgmt: yes Behavioral consideration no Chemo/radiation no   Previous Home Environment (from acute therapy documentation) Living Arrangements: Spouse/significant other Available Help at Discharge: Family, Available PRN/intermittently Type of Home: Apartment Home Layout: One level Home Access: Stairs to enter Technical brewer of Steps: 14 Bathroom Shower/Tub: Chiropodist: Standard Home Care Services: No  Discharge Living Setting Plans for Discharge Living Setting:  Patient's home, Apartment Type of Home at Discharge: Apartment Discharge Home Layout: Other (Comment)(currently 2nd floor, wife confirmed will have 1st floor in the next 2 weeks) Discharge Home Access: Level entry Discharge Bathroom Shower/Tub: Tub/shower unit Discharge Bathroom Toilet: Standard Discharge Bathroom Accessibility: Yes How Accessible: Accessible via walker Does the patient have any problems obtaining your medications?: No  Social/Family/Support Systems Patient Roles: Spouse, Parent Anticipated Caregiver: spouse, Kenney Houseman Anticipated Caregiver's Contact Information: [phone] Ability/Limitations of Caregiver: not currently working 2/2 COVID, also has a 87 y/o daughter who is out of school currently Caregiver Availability: 24/7 Discharge Plan Discussed with Primary Caregiver: Yes Is Caregiver In Agreement with Plan?: Yes Does Caregiver/Family have Issues with Lodging/Transportation while Pt is in Rehab?: No  Goals/Additional Needs Patient/Family Goal for Rehab: PT/OT supervision to mod I Expected length of stay: 12-14 days Cultural Considerations: n/a Dietary Needs: carb modified, thin Equipment Needs: tbd Special Service Needs: HD MWF Pt/Family Agrees to Admission and willing to participate: Yes Program Orientation Provided & Reviewed with Pt/Caregiver Including Roles  & Responsibilities: Yes   Possible need for SNF placement upon discharge: no  Patient Condition: I have reviewed medical records from [facility], spoken with CSW, and patient and spouse. I met with patient at the bedside and discussed with wife via phone for inpatient rehabilitation assessment.  Patient will benefit from ongoing PT and OT, can actively participate in 3 hours of therapy a day 5 days of the week, and can make measurable gains during the admission.  Patient will also benefit from the coordinated team approach during an Inpatient Acute Rehabilitation admission.  The patient will  receive intensive therapy as well as Rehabilitation physician, nursing, social worker, and care management interventions.  Due to safety, skin/wound care, disease management, medication administration, pain management and patient education the patient requires 24 hour a day rehabilitation nursing.  The patient is currently min/mod with mobility and basic ADLs.  Discharge setting and therapy post discharge at home with home health is anticipated.  Patient has agreed to participate in the Acute Inpatient Rehabilitation Program and will admit today.  Preadmission Screen Completed By:  Michel Santee, PT, DPT 08/07/2018 11:08 AM ______________________________________________________________________   Discussed status with Dr. Naaman Plummer on 08/07/18  at 11:33 AM  and received approval for admission today.  Admission Coordinator:  Michel Santee, PT, DPT time 11:33 AM Sudie Grumbling 08/07/18    Assessment/Plan: Diagnosis: Right AKA 1. Does the need for close, 24 hr/day Medical supervision in concert with the patient's rehab needs make it unreasonable for this patient to be served in a less intensive setting? Yes 2. Co-Morbidities requiring supervision/potential complications: ESRD, DM2, HTN, OSA, PVD 3. Due to bladder management, bowel management, safety, skin/wound care, disease management, medication administration, pain management and patient education, does the patient require 24 hr/day rehab nursing? Yes 4. Does the patient require coordinated care of a physician, rehab nurse, PT (1-2 hrs/day, 5 days/week) and OT (1-2 hrs/day, 5 days/week) to address physical and functional deficits in the context of the above medical diagnosis(es)? Yes Addressing deficits in the following areas: balance, endurance, locomotion, strength, transferring, bowel/bladder control, bathing, dressing, feeding, grooming, toileting and psychosocial support 5. Can the patient actively participate in an intensive therapy program of at  least 3 hrs of therapy 5 days a week? Yes 6. The potential for patient to make measurable gains while on inpatient rehab is excellent 7. Anticipated functional outcomes upon discharge from inpatients are: modified independent and supervision PT, modified independent and supervision OT, n/a SLP 8. Estimated rehab length of stay to reach the above functional goals is: 12-14 days 9. Anticipated D/C setting: Home 10. Anticipated post D/C treatments: [place] therapy 11. Overall Rehab/Functional Prognosis: excellent  MD Signature: Meredith Staggers, MD, [place] [facility] 08/07/2018         Revision History

## 2018-08-07 NOTE — H&P (Signed)
Physical Medicine and Rehabilitation Admission H&P    CC: R-AKA due to dehiscence of wound.    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 He developed dehiscence of wound as well as superficial abscess left heel and ulceration left ankle. He was admitted on 08/04/18 for revision to R-AKA by Dr. Sharol Given. Post op wound VAC to stay in place for a week. HD ongoing MWF and anemia of chronic disease stable on ESA.Gabapentin added today to help with pain management.  Therapy evaluations completed and CIR recommended due to deficits in mobility and ADLs.    Review of Systems  Constitutional: Negative for chills and fever.  HENT: Negative for hearing loss and tinnitus.   Eyes: Negative for blurred vision and double vision.  Respiratory: Negative for cough and hemoptysis.   Cardiovascular: Negative for chest pain and palpitations.  Gastrointestinal: Negative for abdominal pain and heartburn.  Genitourinary: Negative for dysuria and urgency.  Musculoskeletal: Negative for myalgias and neck pain.  Neurological: Negative for dizziness and headaches.      Past Medical History:  Diagnosis Date  . Arthritis   . Congestive heart failure (CHF) ([place])   . Depression   . 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 Diabetic gastroparesis ([place]) 11/06/2017  . ESRD (end stage renal disease) on dialysis ([place])   . H/O seasonal allergies   . Hypertension   . Pneumonia   . Sleep apnea     Past Surgical History:  Procedure Laterality Date  . AMPUTATION Right 05/14/2018   Procedure: AMPUTATION BELOW KNEE;  Surgeon: Newt Minion, MD;  Location: [facility];  Service: Orthopedics;  Laterality: Right;  . AMPUTATION Right 08/04/2018   Procedure: RIGHT ABOVE KNEE AMPUTATION;  Surgeon: Newt Minion, MD;  Location: [facility];  Service: Orthopedics;   Laterality: Right;  . ESOPHAGOGASTRODUODENOSCOPY (EGD) WITH PROPOFOL N/A 12/05/2017   Procedure: ESOPHAGOGASTRODUODENOSCOPY (EGD) WITH PROPOFOL;  Surgeon: Doran Stabler, MD;  Location: [facility];  Service: Gastroenterology;  Laterality: N/A;  . HERNIA REPAIR    . IR AV DIALY SHUNT INTRO NEEDLE/INTRACATH INITIAL W/PTA/IMG LEFT  03/30/2017    Family History  Problem Relation Age of Onset  . Diabetes Mother   . Hypertension Father     Social History:  Married.  reports that he has never smoked. He has never used smokeless tobacco. He reports that he does not drink alcohol or use drugs.    Allergies: No Known Allergies    Medications Prior to Admission  Medication Sig Dispense Refill  . brimonidine (ALPHAGAN) 0.2 % ophthalmic solution Place 1 drop into both eyes 2 (two) times daily.    . calcium acetate (PHOSLO) 667 MG capsule Take 2,001 mg by mouth 3 (three) times daily with meals.     . docusate sodium (COLACE) 100 MG capsule Take 100 mg by mouth 2 (two) times daily.    Marland Kitchen doxycycline (VIBRA-TABS) 100 MG tablet Take 1 tablet (100 mg total) by mouth 2 (two) times daily. 28 tablet 0  . fluticasone (FLONASE) 50 MCG/ACT nasal spray Place 1 spray into both nostrils daily.    . insulin lispro (ADMELOG SOLOSTAR) 100 UNIT/ML KwikPen Inject 1-9 Units into the skin 3 (three) times daily. If bs is 121-150=1 unit, 151-200=2 units, 201-250=3 units, 251-300=5 units, 301-350=7 units, 351-400=9 units    .  latanoprost (XALATAN) 0.005 % ophthalmic solution Place 1 drop into both eyes at bedtime.    Marland Kitchen loratadine (CLARITIN) 10 MG tablet Take 10 mg by mouth every evening.    . Melatonin 3 MG CAPS Take 3 mg by mouth at bedtime.    . metoCLOPramide (REGLAN) 5 MG tablet Take 1 tablet (5 mg total) by mouth 3 (three) times daily before meals. 90 tablet 0  . metoprolol succinate (TOPROL-XL) 50 MG 24 hr tablet Take 1 tablet (50 mg total) by mouth at bedtime. Take with or immediately following a meal.    .  omeprazole (PRILOSEC) 20 MG capsule Take 20 mg by mouth at bedtime.    Marland Kitchen oxyCODONE 10 MG TABS Take 1-1.5 tablets (10-15 mg total) by mouth every 4 (four) hours as needed for severe pain (pain score 7-10). (Patient taking differently: Take 10 mg by mouth every 4 (four) hours as needed (pain). ) 15 tablet 0  . sevelamer carbonate (RENVELA) 800 MG tablet TAKE 1 TABLET BY MOUTH DAILY WITH MEALS FOR HYPOCALCEMIA (Patient taking differently: Take 800 mg by mouth 3 (three) times daily with meals. ) 270 tablet 0  . silver sulfADIAZINE (SILVADENE) 1 % cream Apply 1 application topically daily. Apply to left foot and ankle    . timolol (BETIMOL) 0.5 % ophthalmic solution Place 1 drop into both eyes 2 (two) times daily. 10 mL 1  . albuterol (PROVENTIL HFA;VENTOLIN HFA) 108 (90 Base) MCG/ACT inhaler Inhale 1 puff into the lungs every 4 (four) hours as needed for wheezing or shortness of breath.     . ondansetron (ZOFRAN) 4 MG tablet Take 1 tablet (4 mg total) by mouth every 8 (eight) hours as needed for nausea or vomiting. 12 tablet 0    Drug Regimen Review  Drug regimen was reviewed and remains appropriate with no significant issues identified  Home: Home Living Family/patient expects to be discharged to:: Private residence Living Arrangements: Spouse/significant other Available Help at Discharge: Family, Available PRN/intermittently Type of Home: Apartment Home Access: Stairs to enter Technical brewer of Steps: 14 Home Layout: One level Bathroom Shower/Tub: Chiropodist: Standard Home Equipment: Environmental consultant - 2 wheels   Functional History: Prior Function Level of Independence: Needs assistance Gait / Transfers Assistance Needed: can get in/out of w/c by himself; ambulates short distances with RW; able to propel himself when in w/c ADL's / Homemaking Assistance Needed: requires setup assist for ADLs  Functional Status:  Mobility: Bed Mobility Overal bed mobility: Needs  Assistance Bed Mobility: Supine to Sit Supine to sit: Min guard Sit to supine: Min guard General bed mobility comments: OOB upon entry  Transfers Overall transfer level: Needs assistance Equipment used: Rolling walker (2 wheeled) Transfers: Sit to/from Stand Sit to Stand: Mod assist Stand pivot transfers: Min assist General transfer comment: mod assist to ascend from recliner with cueing for hand placement and safety  Ambulation/Gait General Gait Details: pt able to take a few hops on L LE with RW during pivot to chair    ADL: ADL Overall ADL's : Needs assistance/impaired Grooming: Set up, Sitting Upper Body Bathing: Set up, Sitting Lower Body Bathing: Moderate assistance, Sit to/from stand Lower Body Bathing Details (indicate cue type and reason): decreased functional reach to L LE, mod assist sit<>stand  Upper Body Dressing : Set up, Sitting Lower Body Dressing: Moderate assistance, Sit to/from stand Lower Body Dressing Details (indicate cue type and reason): decreased functional reach to L LE, mod assist sit<>stand and reliant on  B UE support in standing  Toilet Transfer Details (indicate cue type and reason): pt declined, sit to stand from recliner with mod assist  Toileting- Clothing Manipulation and Hygiene: Moderate assistance, Sit to/from stand Functional mobility during ADLs: Moderate assistance, Rolling walker, Cueing for safety, Cueing for sequencing General ADL Comments: pt limited by pain, weakness and impaired balance   Cognition: Cognition Overall Cognitive Status: Impaired/Different from baseline Orientation Level: Oriented X4 Cognition Arousal/Alertness: Lethargic Behavior During Therapy: Flat affect Overall Cognitive Status: Impaired/Different from baseline Area of Impairment: Memory, Following commands, Safety/judgement, Awareness, Problem solving Memory: Decreased short-term memory, Decreased recall of precautions Following Commands: Follows one step  commands inconsistently, Follows one step commands with increased time Safety/Judgement: Decreased awareness of deficits, Decreased awareness of safety Awareness: Intellectual Problem Solving: Slow processing, Decreased initiation, Difficulty sequencing, Requires verbal cues   Blood pressure 111/73, pulse 87, temperature 98.8 F (37.1 C), temperature source Oral, resp. rate 18, height 6' (1.829 m), weight 94.7 kg, SpO2 98 %. Physical Exam  Nursing note and vitals reviewed. Constitutional: He is oriented to person, place, and time. He appears well-developed and well-nourished. No distress.  obese  HENT:  Head: Normocephalic and atraumatic.  Eyes: Pupils are equal, round, and reactive to light. EOM are normal.  Neck: Normal range of motion. No thyromegaly present.  Cardiovascular: Normal rate and regular rhythm. Exam reveals no friction rub.  No murmur heard. Respiratory: Effort normal and breath sounds normal. No respiratory distress. He has no wheezes.  GI: Soft. He exhibits no distension. There is no abdominal tenderness.  Musculoskeletal:     Comments: R-AKA with wound VAC in place. Tender to touch and with ROM. Dry dressing on left shin as well as ankle/foot. Necrotic ulcer right 5th MCP.   Neurological: He is alert and oriented to person, place, and time. No cranial nerve deficit. He exhibits normal muscle tone. Coordination normal.  BUE 5/5. RLE: can lift against gravity. LLE: 3/5 prox to distal with submaximal effort. Decreased sensation to LT in distal LLE.   Skin: Skin is warm and dry.  Psychiatric: Judgment and thought content normal.  Flat and disengaged    Results for orders placed or performed during the hospital encounter of 08/04/18 (from the past 48 hour(s))  Glucose, capillary     Status: Abnormal   Collection Time: 08/05/18 12:51 PM  Result Value Ref Range   Glucose-Capillary 227 (H) 70 - 99 mg/dL  Glucose, capillary     Status: Abnormal   Collection Time: 08/05/18   5:42 PM  Result Value Ref Range   Glucose-Capillary 184 (H) 70 - 99 mg/dL  Glucose, capillary     Status: Abnormal   Collection Time: 08/05/18 10:40 PM  Result Value Ref Range   Glucose-Capillary 224 (H) 70 - 99 mg/dL  Glucose, capillary     Status: Abnormal   Collection Time: 08/06/18  7:36 AM  Result Value Ref Range   Glucose-Capillary 166 (H) 70 - 99 mg/dL  Glucose, capillary     Status: Abnormal   Collection Time: 08/06/18 12:06 PM  Result Value Ref Range   Glucose-Capillary 154 (H) 70 - 99 mg/dL  Glucose, capillary     Status: Abnormal   Collection Time: 08/06/18  5:17 PM  Result Value Ref Range   Glucose-Capillary 155 (H) 70 - 99 mg/dL  Glucose, capillary     Status: Abnormal   Collection Time: 08/06/18  9:30 PM  Result Value Ref Range   Glucose-Capillary 113 (H) 70 -  99 mg/dL  Glucose, capillary     Status: None   Collection Time: 08/07/18  6:44 AM  Result Value Ref Range   Glucose-Capillary 83 70 - 99 mg/dL  Renal function panel     Status: Abnormal   Collection Time: 08/07/18  7:27 AM  Result Value Ref Range   Sodium 128 (L) 135 - 145 mmol/L   Potassium 5.2 (H) 3.5 - 5.1 mmol/L   Chloride 89 (L) 98 - 111 mmol/L   CO2 23 22 - 32 mmol/L   Glucose, Bld 99 70 - 99 mg/dL   BUN 78 (H) 6 - 20 mg/dL   Creatinine, Ser 12.62 (H) 0.61 - 1.24 mg/dL   Calcium 8.6 (L) 8.9 - 10.3 mg/dL   Phosphorus 5.3 (H) 2.5 - 4.6 mg/dL   Albumin 2.3 (L) 3.5 - 5.0 g/dL   GFR calc non Af Amer 4 (L) >60 mL/min   GFR calc Af Amer 5 (L) >60 mL/min   Anion gap 16 (H) 5 - 15    Comment: Performed at [facility], [address], [place], [place] [address]  CBC     Status: Abnormal   Collection Time: 08/07/18  7:28 AM  Result Value Ref Range   WBC 9.8 4.0 - 10.5 K/uL   RBC 3.46 (L) 4.22 - 5.81 MIL/uL   Hemoglobin 10.1 (L) 13.0 - 17.0 g/dL   HCT 31.3 (L) 39.0 - 52.0 %   MCV 90.5 80.0 - 100.0 fL   MCH 29.2 26.0 - 34.0 pg   MCHC 32.3 30.0 - 36.0 g/dL   RDW 16.3 (H) 11.5 - 15.5 %    Platelets 228 150 - 400 K/uL   nRBC 0.0 0.0 - 0.2 %    Comment: Performed at [facility], [place] [address], [place], [place] [address]   No results found.     Medical Problem List and Plan: 1.  Functional and mobility deficits secondary to right BKA/PAD  -admit to inpatient rehab 2.  Antithrombotics: -DVT/anticoagulation:  Pharmaceutical: Heparin  -antiplatelet therapy: N/A 3. Pain Management: Gabapentin added today--will decrease to renal dose. Continue oxycodone prn.  4. Mood: LCSW to follow for evaluation and support.   -antipsychotic agents: N/A 5. Neuropsych: This patient is capable of making decisions on his own behalf. 6. Skin/Wound Care: Continue VAC till 5/22. Pressure relief measures for LLE and local care to other wounds.  7. Fluids/Electrolytes/Nutrition: Strict I/O. Monitor weights daily.  8. T2DM: Continue to monitor BS ac/hs and use SSI for elevated BS.  9. ESRD: HD MWF. Schedule at end of the day to allow participation with therapies during the day and to assist with activity tolerance.  10. Diabetic gastroparesis: On Reglan tid ac. 11.  Anemia of chronic disease: Continue Aranesp weekly for supplement.   12. Glaucoma: Managed with Timoptic, Alphagan and Xalatan.       Bary Leriche, PA-C 08/07/2018

## 2018-08-08 ENCOUNTER — Inpatient Hospital Stay (HOSPITAL_COMMUNITY): Payer: Medicare Other

## 2018-08-08 ENCOUNTER — Encounter (HOSPITAL_COMMUNITY): Payer: Self-pay | Admitting: *Deleted

## 2018-08-08 ENCOUNTER — Ambulatory Visit: Payer: Medicare Other | Admitting: Orthopedic Surgery

## 2018-08-08 DIAGNOSIS — Z9981 Dependence on supplemental oxygen: Secondary | ICD-10-CM

## 2018-08-08 DIAGNOSIS — K3184 Gastroparesis: Secondary | ICD-10-CM

## 2018-08-08 DIAGNOSIS — D638 Anemia in other chronic diseases classified elsewhere: Secondary | ICD-10-CM

## 2018-08-08 DIAGNOSIS — E119 Type 2 diabetes mellitus without complications: Secondary | ICD-10-CM

## 2018-08-08 DIAGNOSIS — E1143 Type 2 diabetes mellitus with diabetic autonomic (poly)neuropathy: Secondary | ICD-10-CM

## 2018-08-08 DIAGNOSIS — S78112A Complete traumatic amputation at level between left hip and knee, initial encounter: Secondary | ICD-10-CM

## 2018-08-08 LAB — GLUCOSE, CAPILLARY
Glucose-Capillary: 181 mg/dL — ABNORMAL HIGH (ref 70–99)
Glucose-Capillary: 175 mg/dL — ABNORMAL HIGH (ref 70–99)
Glucose-Capillary: 174 mg/dL — ABNORMAL HIGH (ref 70–99)
Glucose-Capillary: 133 mg/dL — ABNORMAL HIGH (ref 70–99)
Glucose-Capillary: 151 mg/dL — ABNORMAL HIGH (ref 70–99)

## 2018-08-08 MED ORDER — DOXYCYCLINE HYCLATE 100 MG PO TABS
100.0000 mg | ORAL_TABLET | Freq: Two times a day (BID) | ORAL | Status: DC
  Administered 2018-08-08 – 2018-08-15 (×13): 100 mg via ORAL
  Filled 2018-08-08 (×14): qty 1

## 2018-08-08 NOTE — Progress Notes (Signed)
Wound examined--note multiple ruptured blisters with dark adherent necrotic tissue on achilles tendon. Mild erythema noted on dorsal surface. Will order bid dressing changes-awaiting Aquacell. Pictures taken--will forward to Dr. Sharol Given for input.   Medial aspect  Lateral aspect  Forefoot.

## 2018-08-08 NOTE — IPOC Note (Signed)
Individualized overall Plan of Care [facility]) Patient Details Name: Howard Bell MRN: [id number] DOB: 07/23/1965  Admitting Diagnosis: Left AKA  Hospital Problems: Active Problems:   Unilateral AKA, left (HCC)   Anemia of chronic disease   Diabetes mellitus type 2 in nonobese (HCC)   Supplemental oxygen dependent     Functional Problem List: Nursing Pain, Safety, Skin Integrity, Endurance  PT Balance, Safety, Endurance, Motor, Pain, Skin Integrity  OT Balance, Skin Integrity, Endurance, Motor, Safety, Pain  SLP    TR         Basic ADL's: OT Grooming, Bathing, Dressing, Toileting     Advanced  ADL's: OT Simple Meal Preparation     Transfers: PT Bed Mobility, Bed to Chair, Furniture, Teacher, early years/pre, Metallurgist: PT Emergency planning/management officer, Ambulation     Additional Impairments: OT None  SLP        TR      Anticipated Outcomes Item Anticipated Outcome  Self Feeding Independent  Swallowing      Basic self-care  SBA  Toileting  SBA   Bathroom Transfers Mod I  Bowel/Bladder  anuric , mod I assist with bowels   Transfers  S pivot transfers & sit to stand  Locomotion  mod I w/c level, min A limited ambulation  Communication     Cognition     Pain  <3 on 0-10 pain scale   Safety/Judgment  min assist with appropriate assistive equipment    Therapy Plan: PT Intensity: Minimum of 1-2 x/day ,45 to 90 minutes PT Frequency: 5 out of 7 days PT Duration Estimated Length of Stay: 14-18 days OT Intensity: Minimum of 1-2 x/day, 45 to 90 minutes OT Frequency: 5 out of 7 days OT Duration/Estimated Length of Stay: 12-14 days      Team Interventions: Nursing Interventions Bowel Management, Pain Management, Skin Care/Wound Management, Psychosocial Support, Disease Management/Prevention, Medication Management, Discharge Planning  PT interventions Ambulation/gait training, Balance/vestibular training, DME/adaptive equipment instruction, Neuromuscular  re-education, Therapeutic Activities, Therapeutic Exercise, Patient/family education, Functional mobility training, Wheelchair propulsion/positioning, UE/LE Strength taining/ROM  OT Interventions Balance/vestibular training, Neuromuscular re-education, Self Care/advanced ADL retraining, Therapeutic Exercise, Wheelchair propulsion/positioning, DME/adaptive equipment instruction, Pain management, Skin care/wound managment, UE/LE Strength taining/ROM, Patient/family education, UE/LE Coordination activities, Discharge planning, Functional mobility training, Therapeutic Activities  SLP Interventions    TR Interventions    SW/CM Interventions Discharge Planning, Psychosocial Support, Patient/Family Education   Barriers to Discharge MD  Medical stability, Wound care, Lack of/limited family support, Hemodialysis, Weight bearing restrictions and New oxygen  Nursing Inaccessible home environment, Decreased caregiver support, Wound Care, Weight bearing restrictions, Hemodialysis    PT Inaccessible home environment, Wound Care, Hemodialysis, Home environment access/layout currently on second level apartment, states working to get on ground floor  OT Inaccessible home environment Pt currently lives in 2nd floor apartment although reports that it's already in the works to move to a 1st floor apartment in approximately 2 weeks.   SLP      SW       Team Discharge Planning: Destination: PT-Home ,OT- Home , SLP-  Projected Follow-up: PT-Home health PT, 24 hour supervision/assistance, OT-  Home health OT, SLP-  Projected Equipment Needs: PT-Rolling walker with 5" wheels, Sliding board, Wheelchair cushion (measurements), Wheelchair (measurements), 3 in 1 bedside comode(drop arm?), OT- 3 in 1 bedside comode, Tub/shower bench, To be determined, SLP-  Equipment Details: PT-has w/c in room, reports it is from prior facility ? if needs to go back there,  OT-  Patient/family involved in discharge planning: PT- Patient,   OT-Patient, SLP-   MD ELOS: 12-15 days. 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  He developed dehiscence of wound as well as superficial abscess left heel and ulceration left ankle. He was admitted on 08/04/18 for revision to R-AKA by Dr. Sharol Given. Post op wound VAC to stay in place for a week. HD ongoing MWF and anemia of chronic disease stable on ESA.Gabapentin added to help with pain management.  Patient with resulting functional deficits with mobility, transfers, endurance, self-care.  We will set goals for Mod I at wheelchair level for most tasks with PT/OT.  Due to the current state of emergency, patients may not be receiving their 3-hours of Medicare-mandated therapy.  See Team Conference Notes for weekly updates to the plan of care

## 2018-08-08 NOTE — Progress Notes (Signed)
Inpatient Rehabilitation  Patient information reviewed and entered into eRehab system by [facility]. Loni Beckwith., CCC/SLP, PPS Coordinator.  Information including medical coding, functional ability and quality indicators will be reviewed and updated through discharge.    During the Covid 19 public health emergency, the inpatient rehabilitation unit of the [facility] will use acute care beds on another unit to provide each patient with a private room. This effort is to assure proper infection control and to optimize care management during this public health emergency.

## 2018-08-08 NOTE — Evaluation (Addendum)
Physical Therapy Assessment and Plan  Patient Details  Name: Howard Bell MRN: [id number] Date of Birth: 04/29/1965  PT Diagnosis: Abnormality of gait, Difficulty walking, Muscle weakness and Pain in R residual limb Rehab Potential: Good ELOS: 14-18 days   Today's Date: 08/08/2018 PT Individual Time: 1859-0931 Time Calculation: 58 min      Problem List:  Patient Active Problem List   Diagnosis Date Noted  . Anemia of chronic disease   . Diabetes mellitus type 2 in nonobese (HCC)   . Supplemental oxygen dependent   . Unilateral AKA, left ([place]) 08/07/2018  . S/P BKA (below knee amputation), right ([place]) 08/04/2018  . Severe protein-calorie malnutrition ([place]) 08/01/2018  . Dehiscence of amputation stump ([place]) 08/01/2018  . Cutaneous abscess of left foot 08/01/2018  . Cellulitis of right lower extremity   . Influenza A 05/12/2018  . Pneumonia 05/12/2018  . Gangrene of right foot ([place])   . Critical limb ischemia with history of revascularization of same extremity 05/09/2018  . LUQ pain   . 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 ([place]) 11/16/2017  . CKD stage 5 due to type 2 diabetes mellitus ([place]) 11/16/2017  . Glaucoma due to type 2 diabetes mellitus ([place]) 11/16/2017  . Chronic generalized pain 11/16/2017  . Recurrent pneumonia 11/06/2017  . Diabetic gastroparesis ([place]) 11/06/2017  . Generalized abdominal pain 09/13/2017  . Acute pain of right shoulder 07/26/2017  . Acute bursitis of right shoulder 07/26/2017  . Left arm swelling 03/28/2017  . ESRD on dialysis ([place]) 03/28/2017  . Chest pain 11/26/2016  . Essential hypertension 11/26/2016  . Anemia due to end stage renal disease ([place]) 08/04/2016  . Acute respiratory failure with hypoxemia ([place]) 08/04/2016  . Type 2 diabetes mellitus with foot ulcer ([place]) 08/04/2016  . Acute CHF (congestive  heart failure) ([place]) 08/04/2016  . Elevated troponin     Past Medical History:  Past Medical History:  Diagnosis Date  . Arthritis   . Congestive heart failure (CHF) ([place])   . Depression   . 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 Diabetic gastroparesis ([place]) 11/06/2017  . ESRD (end stage renal disease) on dialysis ([place])   . H/O seasonal allergies   . Hypertension   . Pneumonia   . Sleep apnea    Past Surgical History:  Past Surgical History:  Procedure Laterality Date  . AMPUTATION Right 05/14/2018   Procedure: AMPUTATION BELOW KNEE;  Surgeon: Newt Minion, MD;  Location: [facility];  Service: Orthopedics;  Laterality: Right;  . AMPUTATION Right 08/04/2018   Procedure: RIGHT ABOVE KNEE AMPUTATION;  Surgeon: Newt Minion, MD;  Location: [facility];  Service: Orthopedics;  Laterality: Right;  . ESOPHAGOGASTRODUODENOSCOPY (EGD) WITH PROPOFOL N/A 12/05/2017   Procedure: ESOPHAGOGASTRODUODENOSCOPY (EGD) WITH PROPOFOL;  Surgeon: Doran Stabler, MD;  Location: [facility];  Service: Gastroenterology;  Laterality: N/A;  . HERNIA REPAIR    . IR AV DIALY SHUNT INTRO NEEDLE/INTRACATH INITIAL W/PTA/IMG LEFT  03/30/2017    Assessment & Plan Clinical Impression: Howard Bell is a 53 year old male with history of CHF, HTN, T2DM, ESRD,PAD, Right diabetic foot ulcer with infection requiring R-BKA 05/14/18 and was discharged to SNF. He developed dehiscence of wound as well as superficial abscess left heel and ulceration left ankle. He was admitted on 08/04/18 for revision to R-AKA by Dr. Sharol Given.  Post op wound VAC to stay in place for a week. HD ongoing MWF and anemia of chronic disease stable on ESA.Gabapentin added today to help with pain management. Therapy evaluations completed and CIR recommended due to deficits in mobility and ADLs.  Patient transferred to CIR on 08/07/2018 .   Patient currently requires mod with mobility secondary to muscle weakness and pain in  residual limb.  Prior to hospitalization, patient was modified independent  with mobility and lived with Spouse in a Apartment(2nd floor apt. States he has plans to move to a 1st floor apt.) home.  Home access is 14Stairs to enter.  Patient will benefit from skilled PT intervention to maximize safe functional mobility, minimize fall risk and decrease caregiver burden for planned discharge home with 24 hour supervision.  Anticipate patient will benefit from follow up [place] at discharge.  PT - End of Session Activity Tolerance: Improving;Decreased this session;Tolerates 30+ min activity with multiple rests Endurance Deficit: Yes Endurance Deficit Description: weakness in standing, pain limiting endurance and pt with O2 sats 67% on RA prior to placing back on 2L O2  PT Assessment Rehab Potential (ACUTE/IP ONLY): Good PT Barriers to Discharge: [place] home environment;Wound Care;Hemodialysis;Home environment access/layout PT Barriers to Discharge Comments: currently on second level apartment, states working to get on ground floor PT Patient demonstrates impairments in the following area(s): Balance;Safety;Endurance;Motor;Pain;Skin Integrity PT Transfers Functional Problem(s): Bed Mobility;Bed to Chair;Furniture;Car PT Locomotion Functional Problem(s): Wheelchair Mobility;Ambulation PT Plan PT Intensity: Minimum of 1-2 x/day ,45 to 90 minutes PT Frequency: 5 out of 7 days PT Duration Estimated Length of Stay: 14-18 days PT Treatment/Interventions: Ambulation/gait training;Balance/vestibular training;DME/adaptive equipment instruction;Neuromuscular re-education;Therapeutic Activities;Therapeutic Exercise;Patient/family education;Functional mobility training;Wheelchair propulsion/positioning;UE/LE Strength taining/ROM PT Transfers Anticipated Outcome(s): S pivot transfers & sit to stand PT Locomotion Anticipated Outcome(s): mod I w/c level, min A limited ambulation PT Recommendation Follow Up  Recommendations: Home health PT;24 hour supervision/assistance Patient destination: Home Equipment Recommended: Rolling walker with 5" wheels;Sliding board;Wheelchair cushion (measurements);Wheelchair (measurements);3 in 1 bedside comode(drop arm?) Equipment Details: has w/c in room, reports it is from prior facility ? if needs to go back there  Skilled Therapeutic Intervention Patient in supine with elevated HOB.  Performed supine to sit with mod A for scooting hips on bed pain and heavy rail use to lift trunk.  Patient seated EOB for balance assessment, noted able to take resistance, but difficulty reaching down to L foot and down to wound vac on floor on R side with min A needed for safety.  Patient attempted squat pivot to w/c to L, but limited by pain and weakness, sit to stand from elevated bed to RW mod A and increased time and pivot with RW to w/c mod A.  In w/c adjusted leg rest and pt propelled in hallway with increased time initially about 40' then check SpO2 as on 2L O2 in room.  On RA was 67% so applied O2 at 2 LPM and SpO2 up to 94% after 1 minute cues for PLB.  Patient propelled w/c another 32' prior to rest, then another 40' back to room with increased time.  Sit to stand in room to RW with mod A and pt hopped 1 step prior to stopping due to pain and imbalance and sat with uncontrolled descent in w/c.  C/o pain R leg, but no noted drainage from wound vac.  Assisted to sitting in w/c near call bell, obtained alarm belt and handoff to OT end of session.   PT Evaluation Precautions/Restrictions Precautions  Precautions: Fall Precaution Comments: wound VAC surgical incision. Right AKA. 2L oxygen Restrictions Weight Bearing Restrictions: Yes RLE Weight Bearing: Non weight bearing   Pain Pain Assessment Pain Scale: 0-10 Pain Score: 10/10 R LE RN made aware and delivered medication during session Home Living/Prior Functioning Home Living Available Help at Discharge: Family;Available 24  hours/day(wife) Type of Home: Apartment(2nd floor apt. States he has plans to move to a 1st floor apt.) Home Access: Stairs to enter Technical brewer of Steps: 14 Entrance Stairs-Rails: Right Home Layout: One level Bathroom Shower/Tub: Chiropodist: Standard  Lives With: Spouse Prior Function Level of Independence: Independent with basic ADLs;Independent with transfers;Independent with gait  Able to Take Stairs?: Yes Driving: No Comments: was at SNF since March 2 after first amputation; dialysis MWF Cognition Overall Cognitive Status: Within Functional Limits for tasks assessed Arousal/Alertness: Awake/alert Orientation Level: Oriented X4 Sensation Sensation Light Touch: Impaired by gross assessment Hot/Cold: Not tested Proprioception: Not tested Stereognosis: Not tested Additional Comments: decreased on L foot Coordination Gross Motor Movements are Fluid and Coordinated: Not tested Fine Motor Movements are Fluid and Coordinated: Not tested Motor  Motor Motor: Within Functional Limits Motor - Skilled Clinical Observations: generalized weakness  Mobility Bed Mobility Bed Mobility: Supine to Sit Supine to Sit: Moderate Assistance - Patient 50-74% Transfers Transfers: Sit to Stand;Stand to Sit;Stand Pivot Transfers Sit to Stand: Moderate Assistance - Patient 50-74% Stand to Sit: Moderate Assistance - Patient 50-74% Stand Pivot Transfers: Moderate Assistance - Patient 50 - 74% Stand Pivot Transfer Details: Verbal cues for precautions/safety;Verbal cues for safe use of DME/AE;Verbal cues for technique Stand Pivot Transfer Details (indicate cue type and reason): W/ RW cues for technique, safety, assist for balance, walker management Transfer (Assistive device): Rolling walker Locomotion  Gait Ambulation: Yes Gait Assistance: Moderate Assistance - Patient 50-74% Gait Distance (Feet): 1 Feet Assistive device: Rolling walker Gait Assistance Details:  Verbal cues for safe use of DME/AE;Verbal cues for precautions/safety;Verbal cues for technique Gait Assistance Details: assistance to stabilize while hopping forward with L foot x 1 prior to pain and LOB and assist to sit in w/c Stairs / Additional Locomotion Stairs: No Wheelchair Mobility Wheelchair Mobility: Yes Wheelchair Assistance: Chartered loss adjuster: Both upper extremities Wheelchair Parts Management: Supervision/cueing Distance: 50' x 3  Trunk/Postural Assessment  Cervical Assessment Cervical Assessment: Exceptions to WFL(forward head) Thoracic Assessment Thoracic Assessment: Exceptions to WFL(rounded shoulders) Lumbar Assessment Lumbar Assessment: Exceptions to WFL(posterior pelvic tilt) Postural Control Postural Control: Deficits on evaluation(delayed righting reactions)  Balance Balance Balance Assessed: Yes Dynamic Sitting Balance Dynamic Sitting - Balance Support: Feet supported Dynamic Sitting - Level of Assistance: 4: Min assist Dynamic Sitting Balance - Compensations: guarding assist when reaching out of BOS to R Static Standing Balance Static Standing - Balance Support: Bilateral upper extremity supported Static Standing - Level of Assistance: 4: Min assist Static Standing - Comment/# of Minutes: stood about 10 seconds with UE support and min A for balance Dynamic Standing Balance Dynamic Standing - Balance Support: Bilateral upper extremity supported Dynamic Standing - Level of Assistance: 3: Mod assist Dynamic Standing - Comments: one hop forward with RW and mod A for balance Extremity Assessment      RLE Assessment RLE Assessment: Exceptions to [facility] Passive Range of Motion (PROM) Comments: limited by pain in residual limb (AKA) Active Range of Motion (AROM) Comments: limited, but lifts enough from hip to place pillow under General Strength Comments: antigravity strength on R hip, otherwise NT LLE Assessment LLE Assessment:  Exceptions to [facility] Active Range of Motion (AROM) Comments: limited ankle DF due to weakness General Strength Comments: ankle DF strength 3-/5, knee extension 4/5, hip flexion 3+/5    Refer to Care Plan for Long Term Goals  Recommendations for other services: None   Discharge Criteria: Patient will be discharged from PT if patient refuses treatment 3 consecutive times without medical reason, if treatment goals not met, if there is a change in medical status, if patient makes no progress towards goals or if patient is discharged from hospital.  The above assessment, treatment plan, treatment alternatives and goals were discussed and mutually agreed upon: by patient  Jamison Oka, PT 08/08/2018, 5:14 PM

## 2018-08-08 NOTE — Consult Note (Signed)
[facility] Nurse wound consult note Patient receiving care in [facility] 5N17.  I spoke with the Charge Nurse, [place], by telephone.  I explained that I have not heard back from Dr. Hulda Humphrey and the Secure Chat message from this morning asking for a photo of the wounds on his foot.  Chelsie agreed to assess the area and provide the following information.  Reason for Consult: Left foot wound Wound type: unknown at this time. Pressure Injury POA: Yes/No/NA  unknown Measurement: to be provided by the bedside RN in the flowsheet Wound bed: At the lateral side of the left lateral malleolus, there is an area that is wet and pink around the edges, flaking and peeling in the adjacent tissues, and has an open central area about the size of a pencil eraser that is white.  There is a mild foul odor and heavy serosanginous drainage.  Toward the posterior heel there is a "mushy" brown area that appears consistent with a popped blister.  The patient states these areas have been there for about a month. Dressing procedure/placement/frequency: Cleanse the left lateral ankle and posterior heel wounds with saline. Pat dry. Cover with AquaCel Ag+ Kellie Simmering [phone]), secure with kerlex, change daily. Chelsie is also going to ask one of the PAs to photograph the wounds and place them in the EMR.  MDs if you are concerned about possible osteomyelitis, please consult an orthopedic surgeon.  Monitor the wound area(s) for worsening of condition such as: Signs/symptoms of infection,  Increase in size,  Development of or worsening of odor, Development of pain, or increased pain at the affected locations.  Notify the medical team if any of these develop.  Thank you for the consult.  Discussed plan of care with the charge nurse.  [facility] nurse will not follow at this time.  Please re-consult the [facility] team if needed.  Val Riles, RN, MSN, CWOCN, CNS-BC, pager [phone]

## 2018-08-08 NOTE — Evaluation (Signed)
Occupational Therapy Assessment and Plan  Patient Details  Name: Howard Bell MRN: [id number] Date of Birth: 18-Mar-1966  OT Diagnosis: muscle weakness (generalized) Rehab Potential: Rehab Potential (ACUTE ONLY): Good ELOS: 12-14 days   Today's Date: 08/08/2018 OT Individual Time: 0930-1030 OT Individual Time Calculation (min): 60 min     Problem List:  Patient Active Problem List   Diagnosis Date Noted  . Anemia of chronic disease   . Diabetes mellitus type 2 in nonobese (HCC)   . Supplemental oxygen dependent   . Unilateral AKA, left ([place]) 08/07/2018  . S/P BKA (below knee amputation), right ([place]) 08/04/2018  . Severe protein-calorie malnutrition ([place]) 08/01/2018  . Dehiscence of amputation stump ([place]) 08/01/2018  . Cutaneous abscess of left foot 08/01/2018  . Cellulitis of right lower extremity   . Influenza A 05/12/2018  . Pneumonia 05/12/2018  . Gangrene of right foot ([place])   . Critical limb ischemia with history of revascularization of same extremity 05/09/2018  . LUQ pain   . 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 ([place]) 11/16/2017  . CKD stage 5 due to type 2 diabetes mellitus ([place]) 11/16/2017  . Glaucoma due to type 2 diabetes mellitus ([place]) 11/16/2017  . Chronic generalized pain 11/16/2017  . Recurrent pneumonia 11/06/2017  . Diabetic gastroparesis ([place]) 11/06/2017  . Generalized abdominal pain 09/13/2017  . Acute pain of right shoulder 07/26/2017  . Acute bursitis of right shoulder 07/26/2017  . Left arm swelling 03/28/2017  . ESRD on dialysis ([place]) 03/28/2017  . Chest pain 11/26/2016  . Essential hypertension 11/26/2016  . Anemia due to end stage renal disease ([place]) 08/04/2016  . Acute respiratory failure with hypoxemia ([place]) 08/04/2016  . Type 2 diabetes mellitus with foot ulcer ([place]) 08/04/2016  . Acute CHF (congestive  heart failure) ([place]) 08/04/2016  . Elevated troponin     Past Medical History:  Past Medical History:  Diagnosis Date  . Arthritis   . Congestive heart failure (CHF) ([place])   . Depression   . 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 Diabetic gastroparesis ([place]) 11/06/2017  . ESRD (end stage renal disease) on dialysis ([place])   . H/O seasonal allergies   . Hypertension   . Pneumonia   . Sleep apnea    Past Surgical History:  Past Surgical History:  Procedure Laterality Date  . AMPUTATION Right 05/14/2018   Procedure: AMPUTATION BELOW KNEE;  Surgeon: Newt Minion, MD;  Location: [facility];  Service: Orthopedics;  Laterality: Right;  . AMPUTATION Right 08/04/2018   Procedure: RIGHT ABOVE KNEE AMPUTATION;  Surgeon: Newt Minion, MD;  Location: [facility];  Service: Orthopedics;  Laterality: Right;  . ESOPHAGOGASTRODUODENOSCOPY (EGD) WITH PROPOFOL N/A 12/05/2017   Procedure: ESOPHAGOGASTRODUODENOSCOPY (EGD) WITH PROPOFOL;  Surgeon: Doran Stabler, MD;  Location: [facility];  Service: Gastroenterology;  Laterality: N/A;  . HERNIA REPAIR    . IR AV DIALY SHUNT INTRO NEEDLE/INTRACATH INITIAL W/PTA/IMG LEFT  03/30/2017    Assessment & Plan Clinical Impression: Patient is a 53 y.o. year old male with recent admission to the hospital on 08/04/18. History of Right diabetic foot ulcer with infection requiring R-BKA 05/14/18 and was discharged to SNF. He developed dehiscence of wound as well as superficial abscess left heel and ulceration left ankle. He was admitted on 08/04/18 for revision to R-AKA.  Patient transferred to  CIR on 08/07/2018 .    Patient currently requires max with basic self-care skills secondary to muscle weakness and absence of RLE.  Prior to hospitalization, patient could complete basic ADL and IADL with independent .  Patient will benefit from skilled intervention to increase independence with basic self-care skills prior to discharge home with care  partner.  Anticipate patient will require intermittent supervision and follow up home health.  OT - End of Session Activity Tolerance: Tolerates 10 - 20 min activity with multiple rests Endurance Deficit: Yes Endurance Deficit Description: Patient tolerates seated activities without oxygen with no SOB. PT did report that patient's oxygen stats decreased during PT evaluation. OT Assessment Rehab Potential (ACUTE ONLY): Good OT Barriers to Discharge: Inaccessible home environment OT Barriers to Discharge Comments: Pt currently lives in 2nd floor apartment although reports that it's already in the works to move to a 1st floor apartment in approximately 2 weeks.  OT Patient demonstrates impairments in the following area(s): Warehouse manager;Endurance;Motor;Safety;Pain OT Basic ADL's Functional Problem(s): Grooming;Bathing;Dressing;Toileting OT Advanced ADL's Functional Problem(s): Simple Meal Preparation OT Transfers Functional Problem(s): Toilet;Tub/Shower OT Additional Impairment(s): None OT Plan OT Intensity: Minimum of 1-2 x/day, 45 to 90 minutes OT Frequency: 5 out of 7 days OT Duration/Estimated Length of Stay: 12-14 days OT Treatment/Interventions: Balance/vestibular training;Neuromuscular re-education;Self Care/advanced ADL retraining;Therapeutic Exercise;Wheelchair propulsion/positioning;DME/adaptive equipment instruction;Pain management;Skin care/wound managment;UE/LE Strength taining/ROM;Patient/family education;UE/LE Coordination activities;Discharge planning;Functional mobility training;Therapeutic Activities OT Self Feeding Anticipated Outcome(s): Independent OT Basic Self-Care Anticipated Outcome(s): SBA OT Toileting Anticipated Outcome(s): SBA OT Bathroom Transfers Anticipated Outcome(s): Mod I OT Recommendation Patient destination: Home Follow Up Recommendations: Home health OT Equipment Recommended: 3 in 1 bedside comode;Tub/shower bench;To be determined   Skilled  Therapeutic Intervention Patient currently presents with a flat effect and is quite in nature. Does warm up and hold a conversation eventually. Patient initially experiencing intermittent phantom pain in right LE residual limb and hesitant to participate in OT evaluation although did with encouragement and education. ADL completed at sink as patient is not cleared for shower at this time. Skin integrity: Patient has a necrotic ulcer on his right 5th MCP joint and abrasions/wounds noted on left heel and top of foot. Patient reports that he was receiving wound care through an outpatient clinic prior to his amputation. Patient was able to complete UB bathing and dressing with minimal assistance at sink although required more assistance with lower body bathing and dressing. Patient demonstrates poor judgement when discussing washing at the sink, stating that he was clean as he washed last on Thursday. Pt will more than likely require education on proper skin care and how to monitor due to his diabetes. With use of sink, patient was able to complete a sit to stand with moderate assistance although unable to let go to complete any peri area hygeine or to pull pants over hips. During session, patient only had two instances of phantom pain. Decreased activity tolerance was noted during session although no SOB without oxygen. Patient did require VC to remain on task and stay within time frame of session. Patient was left in wheelchair with seat belt alarm with call light within reach.   OT Evaluation Precautions/Restrictions  Precautions Precautions: None Precaution Comments: wound VAC surgical incision. Right AKA. 2L oxygen Restrictions Weight Bearing Restrictions: Yes RLE Weight Bearing: Non weight bearing Pain Pain Assessment Pain Scale: 0-10 Pain Score: 8  Pain Type: Surgical pain Pain Location: Leg(aka) Pain Orientation: Right Pain Descriptors / Indicators: Sharp;Stabbing Pain Onset:  On-going Patients Stated  Pain Goal: 8 Pain Intervention(s): Repositioned Home Living/Prior Functioning Home Living Family/patient expects to be discharged to:: Private residence Living Arrangements: Spouse/significant other Available Help at Discharge: Family, Available 24 hours/day(wife) Type of Home: Apartment(2nd floor apt. 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  Prior Function Level of Independence: Independent with basic ADLs, Independent with transfers, Independent with gait  Able to Take Stairs?: Yes Driving: No Vocation: On disability Comments: was at [facility] since March 2 after first amputation; dialysis MWF ADL ADL Eating: Independent Where Assessed-Eating: Chair Grooming: Minimal assistance Where Assessed-Grooming: Sitting at sink Upper Body Bathing: Setup Where Assessed-Upper Body Bathing: Sitting at sink Lower Body Bathing: Dependent Where Assessed-Lower Body Bathing: Standing at sink, Sitting at sink Upper Body Dressing: Minimal assistance Where Assessed-Upper Body Dressing: Sitting at sink Lower Body Dressing: Dependent Where Assessed-Lower Body Dressing: Sitting at sink, Standing at sink Toileting: Maximal cueing Where Assessed-Toileting: Glass blower/designer: Moderate assistance Toilet Transfer Method: Stand pivot Toilet Transfer Equipment: Grab bars Vision Baseline Vision/History: Wears glasses Wears Glasses: At all times Patient Visual Report: No change from baseline Cognition Overall Cognitive Status: Within Functional Limits for tasks assessed Arousal/Alertness: Awake/alert Orientation Level:  Person;Place;Situation Person: Oriented Place: Oriented Situation: Oriented Year: 2020 Month: May Day of Week: Correct Immediate Memory Recall: Sock;Blue;Bed Memory Recall: Sock;Bed;Blue Memory Recall Sock: With Cue Memory Recall Blue: With Cue Memory Recall Bed: With Cue Sensation Sensation Light Touch: Appears Intact Hot/Cold: Appears Intact Proprioception: Appears Intact Stereognosis: Appears Intact Motor  Motor Motor: Within Functional Limits Mobility  Transfers Sit to Stand: Moderate Assistance - Patient 50-74%  Trunk/Postural Assessment     Balance Balance Balance Assessed: Yes Dynamic Sitting Balance Dynamic Sitting - Balance Support: Feet supported Dynamic Sitting - Level of Assistance: 5: Stand by assistance Extremity/Trunk Assessment RUE Assessment RUE Assessment: Within Functional Limits LUE Assessment LUE Assessment: Within Functional Limits     Refer to Care Plan for Long Term Goals  Recommendations for other services: None    Discharge Criteria: Patient will be discharged from OT if patient refuses treatment 3 consecutive times without medical reason, if treatment goals not met, if there is a change in medical status, if patient makes no progress towards goals or if patient is discharged from hospital.  The above assessment, treatment plan, treatment alternatives and goals were discussed and mutually agreed upon: by patient   Ailene Ravel, OTR/L,CBIS  [phone]  08/08/2018, 12:16 PM

## 2018-08-08 NOTE — Consult Note (Signed)
[facility] Nurse wound consult note Patient receiving care in [facility] 5N17.  At the current time, the FYIS box contains the following statement:  "POSSIBLE NOVEL CORONAVIRUS (2019-NCOV)". Reason for Consult: left heel and ankle wounds Wound type: unknown at this time I have placed a request via Secure Chat to Dr. Hulda Humphrey, for photos to be placed in the EMR.  The [place] nurses are working remotely today and photos facilitate accuracy in providing wound treatment guidance.  Thank you for the consult. Val Riles, RN, MSN, CWOCN, CNS-BC, pager [phone]

## 2018-08-08 NOTE — Progress Notes (Addendum)
[facility] PHYSICAL MEDICINE & REHABILITATION PROGRESS NOTE  Subjective/Complaints: Patient seen sitting up in bed this morning.  States he slept well overnight.  He seems resistant to engage in conversation.  ROS: Denies CP, shortness of breath, nausea, vomiting, diarrhea.  Objective: Vital Signs: Blood pressure (!) 150/39, pulse 93, temperature 98 F (36.7 C), temperature source Oral, resp. rate 18, height 6' (1.829 m), weight 92.1 kg, SpO2 94 %. No results found. Recent Labs    08/07/18 0728  WBC 9.8  HGB 10.1*  HCT 31.3*  PLT 228   Recent Labs    08/07/18 0727  NA 128*  K 5.2*  CL 89*  CO2 23  GLUCOSE 99  BUN 78*  CREATININE 12.62*  CALCIUM 8.6*    Physical Exam: BP (!) 150/39 (BP Location: Right Arm)   Pulse 93   Temp 98 F (36.7 C) (Oral)   Resp 18   Ht 6' (1.829 m)   Wt 92.1 kg   SpO2 94%   BMI 27.54 kg/m  Constitutional: No distress . Vital signs reviewed. HENT: Normocephalic.  Atraumatic. Eyes: EOMI. No discharge. Cardiovascular: No JVD. Respiratory: Normal effort.  + [place]. GI: Non-distended. Musculoskeletal: Right AKA with tenderness Neurological: He is alertand oriented  Motor: Bilateral extremities: 5/5 proximal distal Right lower extremity: Hip flexion: 4/5 (pain inhibition) Left lower extremity: 4/5 proximal to distal Skin: + VAC.  Left lower extremity with necrotic changes.  Please see media tab.  Psychiatric:Flat and disengaged  Assessment/Plan: 1. Functional deficits secondary to right AKA which require 3+ hours per day of interdisciplinary therapy in a comprehensive inpatient rehab setting.  Physiatrist is providing close team supervision and 24 hour management of active medical problems listed below.  Physiatrist and rehab team continue to assess barriers to discharge/monitor patient progress toward functional and medical goals  Care Tool:  Bathing              Bathing assist       Upper Body Dressing/Undressing Upper  body dressing   What is the patient wearing?: Pull over shirt    Upper body assist Assist Level: Moderate Assistance - Patient 50 - 74%    Lower Body Dressing/Undressing Lower body dressing      What is the patient wearing?: Pants     Lower body assist Assist for lower body dressing: Maximal Assistance - Patient 25 - 49%     Toileting Toileting    Toileting assist Assist for toileting: Maximal Assistance - Patient 25 - 49%     Transfers Chair/bed transfer  Transfers assist     Chair/bed transfer assist level: 2 Helpers     Locomotion Ambulation   Ambulation assist              Walk 10 feet activity   Assist           Walk 50 feet activity   Assist           Walk 150 feet activity   Assist           Walk 10 feet on uneven surface  activity   Assist           Wheelchair     Assist               Wheelchair 50 feet with 2 turns activity    Assist            Wheelchair 150 feet activity     Assist  Medical Problem List and Plan: 1.Functional and mobility deficitssecondary to right BKA/PAD with revision to right AKA  Begin CIR evaluations  Reviewed- BKA with wound dehiscence resulting in AKA, labs reviewed  Wean supplemental oxygen as tolerated 2. Antithrombotics: -DVT/anticoagulation:Pharmaceutical:Heparin -antiplatelet therapy: N/A 3. Pain Management:  Continue gabapentin   Continue oxycodone prn. 4. Mood:LCSW to follow for evaluation and support. -antipsychotic agents: N/A 5. Neuropsych: This patientiscapable of making decisions on hisown behalf. 6. Skin/Wound Care:Continue VAC till 5/22.   Dry dressing to left lower extremity 7. Fluids/Electrolytes/Nutrition:Strict I/O.  8. T2DM: Continue to monitor BS ac/hs and use SSI for elevated BS.   Monitor with increased mobility 9. ESRD: HD MWF. Schedule atend of the day to allow participation  with therapies during the day and to assistwith activity tolerance.   Recs per nephro 10. Diabetic gastroparesis: On Reglan tid ac. 11. Anemia of chronic disease: Continue Aranesp weekly for supplement.   Hemoglobin 10.1 on 5/18  Continue to monitor 12. Glaucoma: Managed with Timoptic, Alphagan and Xalatan.   LOS: 1 days A FACE TO FACE EVALUATION WAS PERFORMED  Howard Bell Lorie Phenix 08/08/2018, 10:25 AM

## 2018-08-08 NOTE — Progress Notes (Addendum)
  [facility] Progress Note   Subjective:  Seen while working with PT. No CP or dyspnea.  Objective Vitals:   08/07/18 1630 08/07/18 1929 08/07/18 2042 08/08/18 0513  BP: 129/75 (!) 96/57  (!) 150/39  Pulse: 93 89  93  Resp: 16 15  18   Temp: 98.4 F (36.9 C) 98.5 F (36.9 C)  98 F (36.7 C)  TempSrc: Oral Oral  Oral  SpO2: 93% 94%  94%  Weight:   91.7 kg 92.1 kg  Height:   6' (1.829 m)    Physical Exam General: Well appearing man, NAD Heart: RRR; 2/6 systolic murmur Lungs: CTAB Extremities: R AKA bandaged; no LLE edema Dialysis Access: LUE AVF + bruit  Additional Objective Labs: Basic Metabolic Panel: Recent Labs  Lab 08/04/18 0625 08/07/18 0727  NA 130* 128*  K 4.2 5.2*  CL  --  89*  CO2  --  23  GLUCOSE 132* 99  BUN  --  78*  CREATININE  --  12.62*  CALCIUM  --  8.6*  PHOS  --  5.3*   Liver Function Tests: Recent Labs  Lab 08/07/18 0727  ALBUMIN 2.3*   CBC: Recent Labs  Lab 08/04/18 0625 08/07/18 0728  WBC  --  9.8  HGB 10.9* 10.1*  HCT 32.0* 31.3*  MCV  --  90.5  PLT  --  228   CBG: Recent Labs  Lab 08/06/18 2130 08/07/18 0644 08/07/18 1218 08/07/18 2050 08/08/18 0641  GLUCAP 113* 83 71 159* 175*   Medications:  . brimonidine  1 drop Both Eyes BID  . calcium acetate  2,001 mg Oral TID WC  . Chlorhexidine Gluconate Cloth  6 each Topical Q0600  . [START ON 08/14/2018] darbepoetin (ARANESP) injection - DIALYSIS  60 mcg Intravenous Q Mon-HD  . docusate sodium  100 mg Oral BID  . fluticasone  1 spray Each Nare Daily  . gabapentin  300 mg Oral QHS  . heparin  5,000 Units Subcutaneous Q8H  . insulin aspart  0-5 Units Subcutaneous QHS  . insulin aspart  0-9 Units Subcutaneous TID WC  . latanoprost  1 drop Both Eyes QHS  . loratadine  10 mg Oral QPM  . metoCLOPramide  5 mg Oral TID AC  . metoprolol succinate  50 mg Oral QHS  . mupirocin ointment  1 application Nasal BID  . pantoprazole  40 mg Oral Daily  . sevelamer  carbonate  800 mg Oral TID WC  . timolol  1 drop Both Eyes BID    Dialysis Orders: [facility] 4.25h BFR 450/2x EDW 95.5kg 2K/2.25Ca UFP 2 Hep bolus 9000 U Parsabiv 10mg  TIW Mircera 150 mcg IV q 2 weeks (last 5/4)  Assessment/Plan: 1. 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 Sharol Given.  Now in CIR. 2. ESRD: Continue HD per MWF schedule - next 5/20. Will resume some tight heparin  3. Hypertension/volume: BP variable, will need EDW lowered on discharge.  4. Anemia: Hgb 10.1. Continue Aranesp weekly. 5. Metabolic bone disease: Ca/Phos ok.Continue Ca acetate + Renvela as binder. Parsabiv not available in hospital. Follow labs.  6. T2DM: Insulin per primary.  Veneta Penton, PA-C 08/08/2018, 10:01 AM  [facility] Pager: [phone]  Pt seen, examined and agree w A/P as above.  [facility] 08/08/2018, 1:02 PM

## 2018-08-08 NOTE — Progress Notes (Signed)
Occupational Therapy Session Note  Patient Details  Name: Howard Bell MRN: [id number] Date of Birth: June 28, 1965  Today's Date: 08/08/2018 OT Individual Time: 1400-1500 OT Individual Time Calculation (min): 60 min    Short Term Goals: Week 1:  OT Short Term Goal 1 (Week 1): Patient will complete LB bathing and dressing with moderate assistance with AE if needed.  OT Short Term Goal 2 (Week 1): Patient will complete toileting and toilet transfers with Min assist and appropriate DME as needed.  OT Short Term Goal 3 (Week 1): Patient will complete all UB bathing and dressing with Supervision while seated on EOB or at sink.   Skilled Therapeutic Interventions/Progress Updates:  Completed toilet transfer at start of session with VC for education on technique utilizing grab bar and BSC over the toilet to elevate toilet seat. Patient reports that he has an elevated toilet at home although no grab bar. Recommended that a grab bar and BSC may be needed at home when discharged.  Patient then completed seated BUE strengthening utilizing 7# hand weight and blue theraband. 10-15 repetitions completed for shoulder ranges. Rest breaks taken as needed. Pt had removed oxygen for session and once session ended oxygen stats were checked. Patient at 86% on room air. Nasal cannula was placed at 2L. Pt was unable to increase stats higher than 88%. Increased to 2.5L and patient was able to achieve 91-93%. Pt left in room with seat belt alarm on and call light within reach in wheelchair.   Therapy Documentation Precautions:  Precautions Precautions: Fall Precaution Comments: wound VAC surgical incision. Right AKA. 2L oxygen Restrictions Weight Bearing Restrictions: Yes RLE Weight Bearing: Non weight bearing Pain: Pain Assessment Pain Scale: 0-10 Pain Score: 0-No pain Vision Baseline Vision/History: Wears glasses Wears Glasses: At all times Patient Visual Report: No change from  baseline   Therapy/Group: Individual Therapy   Ailene Ravel, OTR/L,CBIS  [phone]  08/08/2018, 3:48 PM

## 2018-08-08 NOTE — Progress Notes (Signed)
Renal Navigator notified OP HD clinic/SW of plan for hospital discharge today with admission to CIR. Renal Navigator will follow patient for discharge from CIR and notify OP HD clinic of when they can expect patient to return to clinic for HD treatment in order to provide continuity of care.  Alphonzo Cruise Renal Navigator [phone]

## 2018-08-09 ENCOUNTER — Inpatient Hospital Stay (HOSPITAL_COMMUNITY): Payer: Medicare Other | Admitting: Occupational Therapy

## 2018-08-09 ENCOUNTER — Inpatient Hospital Stay (HOSPITAL_COMMUNITY): Payer: Medicare Other

## 2018-08-09 DIAGNOSIS — E1151 Type 2 diabetes mellitus with diabetic peripheral angiopathy without gangrene: Secondary | ICD-10-CM

## 2018-08-09 DIAGNOSIS — IMO0002 Reserved for concepts with insufficient information to code with codable children: Secondary | ICD-10-CM

## 2018-08-09 DIAGNOSIS — R0989 Other specified symptoms and signs involving the circulatory and respiratory systems: Secondary | ICD-10-CM

## 2018-08-09 DIAGNOSIS — E1165 Type 2 diabetes mellitus with hyperglycemia: Secondary | ICD-10-CM

## 2018-08-09 LAB — GLUCOSE, CAPILLARY
Glucose-Capillary: 143 mg/dL — ABNORMAL HIGH (ref 70–99)
Glucose-Capillary: 250 mg/dL — ABNORMAL HIGH (ref 70–99)
Glucose-Capillary: 104 mg/dL — ABNORMAL HIGH (ref 70–99)
Glucose-Capillary: 234 mg/dL — ABNORMAL HIGH (ref 70–99)

## 2018-08-09 LAB — RENAL FUNCTION PANEL
Albumin: 2.4 g/dL — ABNORMAL LOW (ref 3.5–5.0)
Anion gap: 14 (ref 5–15)
BUN: 68 mg/dL — ABNORMAL HIGH (ref 6–20)
CO2: 24 mmol/L (ref 22–32)
Calcium: 8.6 mg/dL — ABNORMAL LOW (ref 8.9–10.3)
Chloride: 92 mmol/L — ABNORMAL LOW (ref 98–111)
Creatinine, Ser: 11.82 mg/dL — ABNORMAL HIGH (ref 0.61–1.24)
GFR calc Af Amer: 5 mL/min — ABNORMAL LOW (ref >60)
GFR calc non Af Amer: 4 mL/min — ABNORMAL LOW (ref >60)
Glucose, Bld: 211 mg/dL — ABNORMAL HIGH (ref 70–99)
Phosphorus: 5.4 mg/dL — ABNORMAL HIGH (ref 2.5–4.6)
Potassium: 5 mmol/L (ref 3.5–5.1)
Sodium: 130 mmol/L — ABNORMAL LOW (ref 135–145)

## 2018-08-09 LAB — CBC
HCT: 29 % — ABNORMAL LOW (ref 39.0–52.0)
Hemoglobin: 9.1 g/dL — ABNORMAL LOW (ref 13.0–17.0)
MCH: 29.4 pg (ref 26.0–34.0)
MCHC: 31.4 g/dL (ref 30.0–36.0)
MCV: 93.5 fL (ref 80.0–100.0)
Platelets: 218 10*3/uL (ref 150–400)
RBC: 3.1 MIL/uL — ABNORMAL LOW (ref 4.22–5.81)
RDW: 16.7 % — ABNORMAL HIGH (ref 11.5–15.5)
WBC: 9.2 10*3/uL (ref 4.0–10.5)
nRBC: 0 % (ref 0.0–0.2)

## 2018-08-09 MED ORDER — GLIPIZIDE 5 MG PO TABS
2.5000 mg | ORAL_TABLET | Freq: Every day | ORAL | Status: DC
  Administered 2018-08-09 – 2018-08-14 (×6): 2.5 mg via ORAL
  Filled 2018-08-09 (×6): qty 1

## 2018-08-09 MED ORDER — HEPARIN SODIUM (PORCINE) 1000 UNIT/ML DIALYSIS: 20.0000 [IU]/kg | INTRAMUSCULAR | Status: DC | PRN

## 2018-08-09 NOTE — Care Management (Signed)
Inpatient [facility] Individual Statement of Services  Patient Name:  Howard Bell  Date:  08/09/2018  Welcome to the [facility].  Our goal is to provide you with an individualized program based on your diagnosis and situation, designed to meet your specific needs.  With this comprehensive rehabilitation program, you will be expected to participate in at least 3 hours of rehabilitation therapies Monday-Friday, with modified therapy programming on the weekends.  Your rehabilitation program will include the following services:  Physical Therapy (PT), Occupational Therapy (OT), Speech Therapy (ST), Therapeutic Recreaction (TR), Neuropsychology, Case Management (Social Worker), Rehabilitation Medicine, Nutrition Services and Pharmacy Services  Weekly team conferences will be held on Wednesdays to discuss your progress.  Your Social Worker will talk with you frequently to get your input and to update you on team discussions.  Team conferences with you and your family in attendance may also be held.  Expected length of stay: 14-18 days   Overall anticipated outcome: supervision to minimal assistance  Depending on your progress and recovery, your program may change. Your Social Worker will coordinate services and will keep you informed of any changes. Your Social Worker's name and contact numbers are listed  below.  The following services may also be recommended but are not provided by the [facility] will be made to provide these services after discharge if needed.  Arrangements include referral to agencies that provide these services.  Your insurance has been verified to be:  Medicare Your primary doctor is:  [facility]  Pertinent information will be shared with your doctor and your insurance company.  Social Worker:  [place], [place] or ([phone]   Information discussed with and copy given to patient by: Lennart Pall, 08/09/2018, 11:14 AM

## 2018-08-09 NOTE — Progress Notes (Signed)
Received message from Dr. Sharol Given per secure chat--will change dressings to silvadene with dry dressings as per recommendations.

## 2018-08-09 NOTE — Progress Notes (Addendum)
  [facility] Progress Note   Subjective:  Seen on HD, 3L UF ordered and tolerating without problems. No CP/dypsnea today.  Objective Vitals:   08/09/18 1316 08/09/18 1345 08/09/18 1350 08/09/18 1355  BP: 112/64 112/63 112/63 119/61  Pulse: 81 81 81 80  Resp: 16 16  18   Temp: 98.6 F (37 C) 98.4 F (36.9 C)    TempSrc: Oral Oral    SpO2: 95% 92%    Weight:  97.4 kg    Height:       Physical Exam General: Well appearing man, NAD Heart: RRR; 2/6 systolic murmur Lungs: CTAB Extremities: R AKA bandaged; no LLE edema Dialysis Access: LUE AVF + bruit  Additional Objective Labs: Basic Metabolic Panel: Recent Labs  Lab 08/04/18 0625 08/07/18 0727  NA 130* 128*  K 4.2 5.2*  CL  --  89*  CO2  --  23  GLUCOSE 132* 99  BUN  --  78*  CREATININE  --  12.62*  CALCIUM  --  8.6*  PHOS  --  5.3*   Liver Function Tests: Recent Labs  Lab 08/07/18 0727  ALBUMIN 2.3*   CBC: Recent Labs  Lab 08/04/18 0625 08/07/18 0728  WBC  --  9.8  HGB 10.9* 10.1*  HCT 32.0* 31.3*  MCV  --  90.5  PLT  --  228   CBG: Recent Labs  Lab 08/08/18 0641 08/08/18 1137 08/08/18 1641 08/08/18 2149 08/09/18 0636  GLUCAP 175* 174* 133* 151* 143*   Medications:  . brimonidine  1 drop Both Eyes BID  . calcium acetate  2,001 mg Oral TID WC  . Chlorhexidine Gluconate Cloth  6 each Topical Q0600  . [START ON 08/14/2018] darbepoetin (ARANESP) injection - DIALYSIS  60 mcg Intravenous Q Mon-HD  . docusate sodium  100 mg Oral BID  . doxycycline  100 mg Oral Q12H  . fluticasone  1 spray Each Nare Daily  . gabapentin  300 mg Oral QHS  . glipiZIDE  2.5 mg Oral QAC breakfast  . heparin  5,000 Units Subcutaneous Q8H  . insulin aspart  0-5 Units Subcutaneous QHS  . insulin aspart  0-9 Units Subcutaneous TID WC  . latanoprost  1 drop Both Eyes QHS  . loratadine  10 mg Oral QPM  . metoCLOPramide  5 mg Oral TID AC  . metoprolol succinate  50 mg Oral QHS  . mupirocin ointment  1  application Nasal BID  . pantoprazole  40 mg Oral Daily  . sevelamer carbonate  800 mg Oral TID WC  . timolol  1 drop Both Eyes BID    Dialysis Orders: [facility] 4.25h BFR 450/2x EDW 95.5kg 2K/2.25Ca UFP 2 Hep bolus 9000 U Parsabiv 10mg  TIW Mircera 150 mcg IV q 2 weeks (last 5/4)  Assessment/Plan: 1. 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 Sharol Given. Now in CIR. 2. ESRD: Continue HD per MWF schedule - HD today. Will resume some tight heparin  3. Hypertension/volume: BP variable, will need EDW lowered on discharge.  4. Anemia: Hgb 10.1. Continue Aranesp weekly. 5. Metabolic bone disease: Ca/Phos ok.Continue Ca acetate + Renvela as binder. Parsabiv not available in hospital. Follow labs.  6. T2DM: Insulin per primary.  Veneta Penton, PA-C 08/09/2018, 2:07 PM  [facility] Pager: [phone]  Pt seen, examined and agree w A/P as above.  Kelly Splinter  MD 08/09/2018, 3:42 PM

## 2018-08-09 NOTE — Progress Notes (Signed)
[place] PHYSICAL MEDICINE & REHABILITATION PROGRESS NOTE  Subjective/Complaints: Patient seen sitting up in his chair this morning eating breakfast, working with therapies.  He states he slept well overnight.  He states that a good first day of therapies yesterday.  ROS: Denies CP, shortness of breath, nausea, vomiting, diarrhea.  Objective: Vital Signs: Blood pressure (!) 97/46, pulse 80, temperature 98 F (36.7 C), temperature source Oral, resp. rate 16, height 6' (1.829 m), weight 92.2 kg, SpO2 98 %. No results found. Recent Labs    08/07/18 0728  WBC 9.8  HGB 10.1*  HCT 31.3*  PLT 228   Recent Labs    08/07/18 0727  NA 128*  K 5.2*  CL 89*  CO2 23  GLUCOSE 99  BUN 78*  CREATININE 12.62*  CALCIUM 8.6*    Physical Exam: BP (!) 97/46 (BP Location: Right Arm)   Pulse 80   Temp 98 F (36.7 C) (Oral)   Resp 16   Ht 6' (1.829 m)   Wt 92.2 kg   SpO2 98%   BMI 27.57 kg/m  Constitutional: No distress . Vital signs reviewed. HENT: Normocephalic.  Atraumatic. Eyes: EOMI.  No discharge. Cardiovascular: No JVD. Respiratory: Normal effort.  + [place]. GI: Non-distended. Musculoskeletal: Right AKA with tenderness, stable Neurological: He is alert Motor: Bilateral extremities: 5/5 proximal distal Right lower extremity: Hip flexion: 4/5 (pain inhibition), stable Left lower extremity: 4/5 proximal to distal, stable Skin: + VAC.  Left lower extremity with necrotic changes.  Please see media tab.  Psychiatric:Flat and disengaged.  Confused. Assessment/Plan: 1. Functional deficits secondary to right AKA which require 3+ hours per day of interdisciplinary therapy in a comprehensive inpatient rehab setting.  Physiatrist is providing close team supervision and 24 hour management of active medical problems listed below.  Physiatrist and rehab team continue to assess barriers to discharge/monitor patient progress toward functional and medical goals  Care Tool:  Bathing     Body parts bathed by patient: Right arm, Left arm, Chest, Abdomen, Face   Body parts bathed by helper: Front perineal area, Buttocks, Left upper leg, Left lower leg Body parts n/a: Right lower leg   Bathing assist Assist Level: Moderate Assistance - Patient 50 - 74%     Upper Body Dressing/Undressing Upper body dressing   What is the patient wearing?: Pull over shirt    Upper body assist Assist Level: Minimal Assistance - Patient > 75%    Lower Body Dressing/Undressing Lower body dressing      What is the patient wearing?: Pants     Lower body assist Assist for lower body dressing: Maximal Assistance - Patient 25 - 49%     Toileting Toileting    Toileting assist Assist for toileting: Maximal Assistance - Patient 25 - 49%     Transfers Chair/bed transfer  Transfers assist     Chair/bed transfer assist level: Moderate Assistance - Patient 50 - 74%     Locomotion Ambulation   Ambulation assist      Assist level: Moderate Assistance - Patient 50 - 74% Assistive device: Walker-rolling Max distance: 1 hop   Walk 10 feet activity   Assist  Walk 10 feet activity did not occur: Safety/medical concerns        Walk 50 feet activity   Assist Walk 50 feet with 2 turns activity did not occur: Safety/medical concerns         Walk 150 feet activity   Assist Walk 150 feet activity did not occur:  Safety/medical concerns         Walk 10 feet on uneven surface  activity   Assist Walk 10 feet on uneven surfaces activity did not occur: Safety/medical concerns         Wheelchair     Assist Will patient use wheelchair at discharge?: Yes Type of Wheelchair: Manual    Wheelchair assist level: Supervision/Verbal cueing Max wheelchair distance: 44'    Wheelchair 50 feet with 2 turns activity    Assist        Assist Level: Supervision/Verbal cueing   Wheelchair 150 feet activity     Assist     Assist Level:  Supervision/Verbal cueing      Medical Problem List and Plan: 1.Functional and mobility deficitssecondary to right BKA/PAD with revision to right AKA  Continue CIR  Wean supplemental oxygen as tolerated  Team conference today to discuss current and goals and coordination of care, home and environmental barriers, and discharge planning with nursing, case manager, and therapies.  2. Antithrombotics: -DVT/anticoagulation:Pharmaceutical:Heparin -antiplatelet therapy: N/A 3. Pain Management:  Continue gabapentin   Continue oxycodone prn. 4. Mood:LCSW to follow for evaluation and support. -antipsychotic agents: N/A 5. Neuropsych: This patientiscapable of making decisions on hisown behalf. 6. Skin/Wound Care:Continue VAC till 5/22.   Discussed with Ortho and PA- Silvadene dressing.  Severe PVD with limited blood flow per vascular. 7. Fluids/Electrolytes/Nutrition:Strict I/O.  8. T2DM: Hemoglobin A1c 10.1 on 5/18.  Continue to monitor BS ac/hs and use SSI for elevated BS.   Glucotrol 2.5 started on 5/20  Monitor with increased mobility 9. ESRD: HD MWF. Schedule atend of the day to allow participation with therapies during the day and to assistwith activity tolerance.   Recs per nephro 10. Diabetic gastroparesis: On Reglan tid ac. 11. Anemia of chronic disease: Continue Aranesp weekly for supplement.   Hemoglobin 10.1 on 5/18  Continue to monitor 12. Glaucoma: Managed with Timoptic, Alphagan and Xalatan. 13.  Labile blood pressure  Labile on 5/20  LOS: 2 days A FACE TO FACE EVALUATION WAS PERFORMED  Ankit Lorie Phenix 08/09/2018, 12:28 PM

## 2018-08-09 NOTE — Progress Notes (Signed)
Social Work Social Work Assessment and Plan  Patient Details  Name: Howard Bell MRN: [id number] Date of Birth: [birth year]  Today's Date: 08/09/2018  Problem List:  Patient Active Problem List   Diagnosis Date Noted  . Anemia of chronic disease   . Diabetes mellitus type 2 in nonobese (HCC)   . Supplemental oxygen dependent   . Unilateral AKA, left ([place]) 08/07/2018  . S/P BKA (below knee amputation), right ([place]) 08/04/2018  . Severe protein-calorie malnutrition ([place]) 08/01/2018  . Dehiscence of amputation stump ([place]) 08/01/2018  . Cutaneous abscess of left foot 08/01/2018  . Cellulitis of right lower extremity   . Influenza A 05/12/2018  . Pneumonia 05/12/2018  . Gangrene of right foot ([place])   . Critical limb ischemia with history of revascularization of same extremity 05/09/2018  . LUQ pain   . 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 ([place]) 11/16/2017  . CKD stage 5 due to type 2 diabetes mellitus ([place]) 11/16/2017  . Glaucoma due to type 2 diabetes mellitus ([place]) 11/16/2017  . Chronic generalized pain 11/16/2017  . Recurrent pneumonia 11/06/2017  . Diabetic gastroparesis ([place]) 11/06/2017  . Generalized abdominal pain 09/13/2017  . Acute pain of right shoulder 07/26/2017  . Acute bursitis of right shoulder 07/26/2017  . Left arm swelling 03/28/2017  . ESRD on dialysis ([place]) 03/28/2017  . Chest pain 11/26/2016  . Essential hypertension 11/26/2016  . Anemia due to end stage renal disease ([place]) 08/04/2016  . Acute respiratory failure with hypoxemia ([place]) 08/04/2016  . Type 2 diabetes mellitus with foot ulcer ([place]) 08/04/2016  . Acute CHF (congestive heart failure) ([place]) 08/04/2016  . Elevated troponin    Past Medical History:  Past Medical History:  Diagnosis Date  . Arthritis   . Congestive heart failure (CHF) ([place])   . Depression   .  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 Diabetic gastroparesis ([place]) 11/06/2017  . ESRD (end stage renal disease) on dialysis ([place])   . H/O seasonal allergies   . Hypertension   . Pneumonia   . Sleep apnea    Past Surgical History:  Past Surgical History:  Procedure Laterality Date  . AMPUTATION Right 05/14/2018   Procedure: AMPUTATION BELOW KNEE;  Surgeon: Newt Minion, MD;  Location: [place];  Service: Orthopedics;  Laterality: Right;  . AMPUTATION Right 08/04/2018   Procedure: RIGHT ABOVE KNEE AMPUTATION;  Surgeon: Newt Minion, MD;  Location: [facility];  Service: Orthopedics;  Laterality: Right;  . ESOPHAGOGASTRODUODENOSCOPY (EGD) WITH PROPOFOL N/A 12/05/2017   Procedure: ESOPHAGOGASTRODUODENOSCOPY (EGD) WITH PROPOFOL;  Surgeon: Doran Stabler, MD;  Location: [facility];  Service: Gastroenterology;  Laterality: N/A;  . HERNIA REPAIR    . IR AV DIALY SHUNT INTRO NEEDLE/INTRACATH INITIAL W/PTA/IMG LEFT  03/30/2017   Social History:  reports that he has never smoked. He has never used smokeless tobacco. He reports that he does not drink alcohol or use drugs.  Family / Support Systems Marital Status: Married How Long?: 5 yrs Patient Roles: Spouse, Parent Spouse/Significant Other: wife, Kalum Minner @ [phone] Children: 31 yo step-daughter, Chemical engineer Anticipated Caregiver: spouse, Kenney Houseman Ability/Limitations of Caregiver: not currently working 2/2 COVID, also has a 77 y/o daughter who is out of school currently Caregiver Availability: 24/7 Family Dynamics: Pt reports wife is very supportive and able to assist.  She was very  hopeful for pt's rehab and confirms she will provide any needed assistance.  Social History Preferred language: English Religion: Seventh Day Adventist Cultural Background: NA Read: Yes Write: Yes Employment Status: Disabled Date Retired/Disabled/Unemployed: ~15 yrs Public relations account executive Issues:  None Guardian/Conservator: None - per MD, pt is capable of making decisions on his own behalf.   Abuse/Neglect Abuse/Neglect Assessment Can Be Completed: Yes Physical Abuse: Denies Verbal Abuse: Denies Sexual Abuse: Denies Exploitation of patient/patient's resources: Denies Self-Neglect: Denies  Emotional Status Pt's affect, behavior and adjustment status: Pt sitting up in w/c and able to provide basic information for assessment.  Appears fatigued and affect rather flat.  Pt denies any significant emotional distress, however, may benefit from neuropsychology consult while here. Recent Psychosocial Issues: Ongoing medical issues for many years and recent SNF placement. Psychiatric History: None Substance Abuse History: None  Patient / Family Perceptions, Expectations & Goals Pt/Family understanding of illness & functional limitations: Pt and wife with good understanding of his medical issues, current functional limitations/ need for CIR. Premorbid pt/family roles/activities: Pt was at [facility] since Feb following BKA at time of re-hospitalization.   Anticipated changes in roles/activities/participation: Wife to provide primary caregiver support. Pt/family expectations/goals: "I just want to get home as soon as I can."  [employer]: Other (Comment)(@ [facility] SNF PTA) Premorbid Home Care/DME Agencies: None Transportation available at discharge: yes, wife and [facility] can assist with needs. Resource referrals recommended: Neuropsychology, Support group (specify)  Discharge Planning Living Arrangements: Spouse/significant other, Children Support Systems: Spouse/significant other, Children Type of Residence: Private residence Insurance Resources: [employer] Financial Resources: SSD Financial Screen Referred: No Living Expenses: Rent Money Management: Spouse Does the patient have any problems obtaining your medications?: No Home Management: mostly  wife Patient/Family Preliminary Plans: Pt to d/c home with wife to provide 24/7 assistance. Social Work Anticipated Follow Up Needs: [place] Additional Notes/Comments: Wife reports she is securing a 1st floor apt and should be ready for them by d/c from CIR. Expected length of stay: 14-18 days  Clinical Impression Pleasant gentleman here following AKA.  Recent BKA in Feb 2020 and has been in SNF since that.  Plan now for pt to return home with wife and 57 yo daughter.  Pt denies any emotional distress, however, flat affect and appears very fatigued.  Will monitor for possible neuropsychology consult.  Wife able to provide 24/7 support.    [place], [place] 08/09/2018, 11:12 AM

## 2018-08-09 NOTE — Progress Notes (Signed)
Occupational Therapy Session Note  Patient Details  Name: Howard Bell MRN: [id number] Date of Birth: Nov 28, 1965  Today's Date: 08/09/2018 OT Individual Time: 7564-3329 OT Individual Time Calculation (min): 45 min  and Today's Date: 08/09/2018 OT Missed Time: 15 Minutes Missed Time Reason: Patient unwilling/refused to participate without medical reason(breakfast)   Short Term Goals: Week 1:  OT Short Term Goal 1 (Week 1): Patient will complete LB bathing and dressing with moderate assistance with AE if needed.  OT Short Term Goal 2 (Week 1): Patient will complete toileting and toilet transfers with Min assist and appropriate DME as needed.  OT Short Term Goal 3 (Week 1): Patient will complete all UB bathing and dressing with Supervision while seated on EOB or at sink.   Skilled Therapeutic Interventions/Progress Updates:    Treatment session with focus on functional transfers and care for residual limb.  Pt received in bed asleep, requiring increased time to fully arouse.  Pt resistant to participation in therapy session and refused bathing/dressing.  Pt agreeable to get OOB to engage in therapeutic activities.  Completed bed mobility with CGA and mod assist squat pivot to Rt to w/c.  Max cues for sequencing due to decreased weight shift and problem solving during mobility.  Breakfast tray arrived, to which pt refused further therapy.  Engaged in discussion regarding purpose of therapy and therapy goals.  Pt continued to refuse, therefore pt remained upright in w/c with seat belt on and all needs in reach.  Therapy Documentation Precautions:  Precautions Precautions: Fall Precaution Comments: wound VAC surgical incision. Right AKA. 2L oxygen Restrictions Weight Bearing Restrictions: Yes RLE Weight Bearing: Non weight bearing General: General OT Amount of Missed Time: 15 Minutes Pain: Pain Assessment Pain Score: 2    Therapy/Group: Individual Therapy  Simonne Come 08/09/2018,  12:25 PM

## 2018-08-09 NOTE — Progress Notes (Signed)
Physical Therapy Session Note  Patient Details  Name: Howard Bell MRN: [id number] Date of Birth: Dec 24, 1965  Today's Date: 08/09/2018 PT Individual Time: 0930-1030 PT Individual Time Calculation (min): 60 min   Short Term Goals: Week 1:  PT Short Term Goal 1 (Week 1): Patient will perform squat and stand pivot transfers with min A. PT Short Term Goal 2 (Week 1): Patient will demonstrate dynamic sitting balance with S.  PT Short Term Goal 3 (Week 1): Patient will ambulate 82' with LRAD and mod A PT Short Term Goal 4 (Week 1): Patient will perform w/c mobility S x continuous 150'   Skilled Therapeutic Interventions/Progress Updates:    Patient in w/c in room.  Applied portable O2  2 LPM and pt propelled w/c x 50' with cues.  Sit to stand x 2 at wall rail in hallway with mod A and max cues for technique.  Standing about 10-20 seconds and able to perform hip extension noted below on second stand.  Patient in w/c pushed to therapy gym.  Performed slide board transfer to mat to L with min A and cues increased time.  Patient on mat to supine with S (unsafely,) and performed LE therex as noted below.  Patient supine to sit min a and transfer to w/c to L with slide board and min A.  Propelled w/c to room.  Performed w/c push ups as noted below and left in w/c with call ball in reach and seat belt alarm activated.   Therapy Documentation Precautions:  Precautions Precautions: Fall Precaution Comments: wound VAC surgical incision. Right AKA. 2L oxygen Restrictions Weight Bearing Restrictions: Yes RLE Weight Bearing: Non weight bearing Pain: Pain Assessment Pain Score: 8  Pain Type: Surgical pain Pain Location: Leg Pain Orientation: Right Pain Descriptors / Indicators: Guarding;Sore Pain Onset: On-going Pain Intervention(s): Ambulation/increased activity;Distraction Exercises: Total Joint Exercises Bridges: Strengthening;Left;20 reps(10 reps L only, then 10 w/ R leg on bolster to  push) Amputee Exercises Towel Squeeze: Strengthening;Both;10 reps;Supine Hip ABduction/ADduction: Strengthening;Right;20 reps;Sidelying Chair Push Up: Strengthening;5 reps;Seated Other Exercises Other Exercises: standing hip extension x 10    Therapy/Group: Individual Therapy  Reginia Naas  Magda Kiel, PT 08/09/2018, 12:31 PM

## 2018-08-09 NOTE — Progress Notes (Signed)
Occupational Therapy Session Note  Patient Details  Name: Howard Bell MRN: [id number] Date of Birth: [birth year]  Today's Date: 08/09/2018 OT Individual Time: 1120-1205 OT Individual Time Calculation (min): 45 min    Short Term Goals: Week 1:  OT Short Term Goal 1 (Week 1): Patient will complete LB bathing and dressing with moderate assistance with AE if needed.  OT Short Term Goal 2 (Week 1): Patient will complete toileting and toilet transfers with Min assist and appropriate DME as needed.  OT Short Term Goal 3 (Week 1): Patient will complete all UB bathing and dressing with Supervision while seated on EOB or at sink.   Skilled Therapeutic Interventions/Progress Updates:    Pt received in w/c on 2L O2 and agreeable to exercise. Pt worked on w/c pushups with cuing for hand placement and body position. His L foot kept slipping forward on floor and he was unable to get full wt on it so this therapist place my foot to block his. Pt worked on pushing up with limited hip lift for quick repetitions and then 3 sec isometric holds. Pt has a L 4th finger contracture.  His processing seemed slow as he was answering questions and had difficulty remembering names of places.  He was feeling tired so worked with slide board to bed with total A for placement and then cga to min a to scoot on board with cues for head/hip positioning. Pt layed down and adjusted in bed with all needs met.  Bed alarm set.    Therapy Documentation Precautions:  Precautions Precautions: Fall Precaution Comments: wound VAC surgical incision. Right AKA. 2L oxygen Restrictions Weight Bearing Restrictions: Yes RLE Weight Bearing: Non weight bearing    Pain: Pain Assessment Pain Score: 8  Pain Type: Surgical pain Pain Location: Leg Pain Orientation: Right Pain Descriptors / Indicators: Guarding;Sore Pain Onset: On-going Pain Intervention(s): Ambulation/increased activity;Distraction ADL: ADL Eating:  Independent Where Assessed-Eating: Chair Grooming: Minimal assistance Where Assessed-Grooming: Sitting at sink Upper Body Bathing: Setup Where Assessed-Upper Body Bathing: Sitting at sink Lower Body Bathing: Dependent Where Assessed-Lower Body Bathing: Standing at sink, Sitting at sink Upper Body Dressing: Minimal assistance Where Assessed-Upper Body Dressing: Sitting at sink Lower Body Dressing: Dependent Where Assessed-Lower Body Dressing: Sitting at sink, Standing at sink Toileting: Maximal cueing Where Assessed-Toileting: Glass blower/designer: Moderate assistance Toilet Transfer Method: Stand pivot Toilet Transfer Equipment: Grab bars   Therapy/Group: Individual Therapy  [place] 08/09/2018, 12:34 PM

## 2018-08-10 ENCOUNTER — Inpatient Hospital Stay (HOSPITAL_COMMUNITY): Payer: Medicare Other

## 2018-08-10 DIAGNOSIS — R410 Disorientation, unspecified: Secondary | ICD-10-CM

## 2018-08-10 LAB — GLUCOSE, CAPILLARY
Glucose-Capillary: 136 mg/dL — ABNORMAL HIGH (ref 70–99)
Glucose-Capillary: 127 mg/dL — ABNORMAL HIGH (ref 70–99)
Glucose-Capillary: 150 mg/dL — ABNORMAL HIGH (ref 70–99)
Glucose-Capillary: 129 mg/dL — ABNORMAL HIGH (ref 70–99)

## 2018-08-10 MED ORDER — CHLORHEXIDINE GLUCONATE CLOTH 2 % EX PADS
6.0000 | MEDICATED_PAD | Freq: Every day | CUTANEOUS | Status: DC
  Administered 2018-08-10 – 2018-08-13 (×2): 6 via TOPICAL

## 2018-08-10 MED ORDER — PRO-STAT SUGAR FREE PO LIQD
30.0000 mL | Freq: Two times a day (BID) | ORAL | Status: DC
  Administered 2018-08-10 – 2018-08-15 (×10): 30 mL via ORAL
  Filled 2018-08-10 (×11): qty 30

## 2018-08-10 NOTE — Patient Care Conference (Signed)
Inpatient RehabilitationTeam Conference and Plan of Care Update Date: 08/09/2018   Time: 1:30 PM    Patient Name: Howard Bell      Medical Record Number: [id number]  Date of Birth: 1965-12-24 Sex: Male         Room/Bed: 5N17C/5N17C-01 Payor Info: Payor: MEDICARE / Plan: MEDICARE PART A AND B / Product Type: *No Product type* /    Admitting Diagnosis: NT Team Uni. Rt AKA; [id number]   Admit Date/Time:  08/07/2018  5:02 PM Admission Comments: No comment available   Primary Diagnosis:  <principal problem not specified> Principal Problem: <principal problem not specified>  Patient Active Problem List   Diagnosis Date Noted  . Labile blood pressure   . Uncontrolled diabetes mellitus type 2 with peripheral artery disease ([place])   . Anemia of chronic disease   . Diabetes mellitus type 2 in nonobese ([facility])   . Supplemental oxygen dependent   . Unilateral AKA, left ([place]) 08/07/2018  . S/P BKA (below knee amputation), right ([place]) 08/04/2018  . Severe protein-calorie malnutrition ([place]) 08/01/2018  . Dehiscence of amputation stump ([place]) 08/01/2018  . Cutaneous abscess of left foot 08/01/2018  . Cellulitis of right lower extremity   . Influenza A 05/12/2018  . Pneumonia 05/12/2018  . Gangrene of right foot ([place])   . Critical limb ischemia with history of revascularization of same extremity 05/09/2018  . LUQ pain   . 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 ([place]) 11/16/2017  . CKD stage 5 due to type 2 diabetes mellitus ([place]) 11/16/2017  . Glaucoma due to type 2 diabetes mellitus ([place]) 11/16/2017  . Chronic generalized pain 11/16/2017  . Recurrent pneumonia 11/06/2017  . Diabetic gastroparesis ([place]) 11/06/2017  . Generalized abdominal pain 09/13/2017  . Acute pain of right shoulder 07/26/2017  . Acute bursitis of right shoulder 07/26/2017  . Left arm  swelling 03/28/2017  . ESRD on dialysis ([place]) 03/28/2017  . Chest pain 11/26/2016  . Essential hypertension 11/26/2016  . Anemia due to end stage renal disease ([place]) 08/04/2016  . Acute respiratory failure with hypoxemia ([place]) 08/04/2016  . Type 2 diabetes mellitus with foot ulcer ([place]) 08/04/2016  . Acute CHF (congestive heart failure) ([place]) 08/04/2016  . Elevated troponin     Expected Discharge Date: Expected Discharge Date: 08/23/18  Team Members Present: Physician leading conference: Dr. Delice Lesch Social Worker Present: Lennart Pall, LCSW Nurse Present: Other (comment)(Miranda Surles, RN) PT Present: Roderic Ovens, PT OT Present: Other (comment)(Sandra Rosana Hoes, OT) [place] Coordinator present : Ileana Ladd, PT     Current Status/Progress Goal Weekly Team Focus  Medical   Functional and mobility deficitssecondary to right BKA/PAD with revision to right AKA  Improve mobility, wounds, DM, BP  See above   Bowel/Bladder   (HD pt) anuric; LBM: 05/18 (per pt)  normal bowel pattern   assist with tolieting needs prn    Swallow/Nutrition/ Hydration             ADL's   mod A stand pivot, CGA- min A slide board, mod A LB ADLs and toileting  S overall   ADL training, functional mobility, pt education, strengthening, endurance   Mobility   mod A stand pivot w/RW, S w/c mobility 50', SpO2 67% on RA, 94% w/2L O2, 1 step w/ RW mod A unsafe  S w/c level overall, min A car transfers, gait in controlled  environ only 20' min a  transfers, balance, initiate gait, safety, activity tolerance due to pain   Communication             Safety/Cognition/ Behavioral Observations            Pain   c/o on surgical incision; prn tramadol q6hr and oxy q4hr  pain level <4/10  assess pain QS and prn   Skin   wound vac on L AKA; diabetic ulcer on R leg, treated with aquacell and kerlex  continue treating curtain skin issues per orders; remain free of new skin infection/breakdown  assess skin QS and prn      Rehab Goals Patient on target to meet rehab goals: Yes *See Care Plan and progress notes for long and short-term goals.     Barriers to Discharge  Current Status/Progress Possible Resolutions Date Resolved   Physician    Medical stability;Wound Care;Hemodialysis;Weight bearing restrictions     See above  Therapies, optimize DM/BP meds, recs per Nephro      Nursing  Inaccessible home environment;Decreased caregiver support;Wound Care;Weight bearing restrictions;Hemodialysis               PT  Inaccessible home environment;Wound Care;Hemodialysis;Home environment access/layout  currently on second level apartment, states working to get on ground floor              OT Inaccessible home environment  Pt currently lives in 2nd floor apartment although reports that it's already in the works to move to a 1st floor apartment in approximately 2 weeks.              SLP                SW                Discharge Planning/Teaching Needs:  Plan for home with wife and 51 y.o daughter to provide 24/7 assistance.  Will plan teaching closer to d/c date.   Team Discussion:  Plan for wound VAC until 5/22;  Left LE also a concern and being monitored closely.  Adjusting BP and DM meds.  MD would like to wean off his O2.  CGA - mod assist overall with therapies.  Goals for supervision w/c level and min gait for very short distances.  Concern with cognition.  Slow to process information.  MD ordering ST eval.  Revisions to Treatment Plan:  ST eval added.    Continued Need for Acute Rehabilitation Level of Care: The patient requires daily medical management by a physician with specialized training in physical medicine and rehabilitation for the following conditions: Daily direction of a multidisciplinary physical rehabilitation program to ensure safe treatment while eliciting the highest outcome that is of practical value to the patient.: Yes Daily medical management of patient stability for increased  activity during participation in an intensive rehabilitation regime.: Yes Daily analysis of laboratory values and/or radiology reports with any subsequent need for medication adjustment of medical intervention for : Post surgical problems;Diabetes problems;Wound care problems;Blood pressure problems;Renal problems   I attest that I was present, lead the team conference, and concur with the assessment and plan of the team.   Lennart Pall 08/10/2018, 12:28 PM    Team conference was held via web/ teleconference due to [place] - 19

## 2018-08-10 NOTE — Progress Notes (Signed)
Physical Therapy Session Note  Patient Details  Name: Howard Bell MRN: [id number] Date of Birth: May 09, 1965  Today's Date: 08/10/2018 PT Individual Time: 0930-1030 PT Individual Time Calculation (min): 60 min   Short Term Goals: Week 1:  PT Short Term Goal 1 (Week 1): Patient will perform squat and stand pivot transfers with min A. PT Short Term Goal 2 (Week 1): Patient will demonstrate dynamic sitting balance with S.  PT Short Term Goal 3 (Week 1): Patient will ambulate 67' with LRAD and mod A PT Short Term Goal 4 (Week 1): Patient will perform w/c mobility S x continuous 150'   Skilled Therapeutic Interventions/Progress Updates:    Patient in supine and asleep.  Awakened easily, but still lethargic.  Supine to sit with min A.  Slide board transfer to w/c with A to place board and CGA for safety with transfer.  Patient jerky so checked BP as noted below in vitals.  Patient propelled w/c x 150' with S increased time.  Patient transfer to mat via slide board with CGA.  Sit to supine with S.  Performed supine therex R hip abduction, flexion, L single leg bridge and hip adductor sets all x 10.  Supine to sit min A noted on 2L O2 pt with SpO2 81% in supine.  Improved >90% in <30 sec.  Patient sit to stand to RW mod A and standing hip extension on R.  Stand pivot to w/c with RW and mod A.  Patient propelled in w/c to room and left in w/c with seat belt alarm and call bell in reach.   Therapy Documentation Precautions:  Precautions Precautions: Fall Precaution Comments: wound VAC surgical incision. Right AKA. 2L oxygen Restrictions Weight Bearing Restrictions: Yes RLE Weight Bearing: Non weight bearing Vital Signs: Therapy Vitals Pulse Rate: 87 BP: (!) 95/59 Patient Position (if appropriate): Sitting Oxygen Therapy SpO2: 96 % O2 Device: Nasal Cannula O2 Flow Rate (L/min): 2 L/min Patient Activity (if Appropriate): In chair Pulse Oximetry Type: Intermittent Pain: Pain  Assessment Pain Score: 5  Pain Type: Surgical pain;Neuropathic pain Pain Location: Leg Pain Orientation: Right Pain Descriptors / Indicators: Pins and needles;Sharp Pain Onset: On-going Pain Intervention(s): Repositioned    Therapy/Group: Individual Therapy  Reginia Naas  Magda Kiel, PT 08/10/2018, 9:54 AM

## 2018-08-10 NOTE — Progress Notes (Signed)
Occupational Therapy Session Note  Patient Details  Name: Howard Bell MRN: [id number] Date of Birth: 23-Apr-1965  Today's Date: 08/10/2018 OT Individual Time: 1205-1240 OT Individual Time Calculation (min): 35 min    Short Term Goals: Week 1:  OT Short Term Goal 1 (Week 1): Patient will complete LB bathing and dressing with moderate assistance with AE if needed.  OT Short Term Goal 2 (Week 1): Patient will complete toileting and toilet transfers with Min assist and appropriate DME as needed.  OT Short Term Goal 3 (Week 1): Patient will complete all UB bathing and dressing with Supervision while seated on EOB or at sink.   Skilled Therapeutic Interventions/Progress Updates:    Pt in wheelchair with head down sleeping upon therapy arrival. Patient appears to be fatigued and tired although agreed to work with therapy briefly. Patient did request to lay down although therapist encouraged him to remain in chair until he eats his lunch then transfer to bed to take a nap prior to afternoon session. Completed BUE strengthening seated in hallway using blue theraband. Set-up required with VC for form and technique. Completed 12 repetitions for shoulder ranges. Rest breaks taken as needed. Oxygen stats checked in middle of session and patient was 100% on 2L. Pt left in chair with case management. Seat belt alarm on and call light within reach.   Therapy Documentation Precautions:  Precautions Precautions: Fall Precaution Comments: wound VAC surgical incision. Right AKA. 2L oxygen Restrictions Weight Bearing Restrictions: Yes RLE Weight Bearing: Non weight bearing Pain: Pain Assessment Pain Scale: 0-10 Pain Score: 0-No pain    Therapy/Group: Individual Therapy Ailene Ravel, OTR/L,CBIS  [phone]  08/10/2018, 1:14 PM

## 2018-08-10 NOTE — Progress Notes (Signed)
[facility] PHYSICAL MEDICINE & REHABILITATION PROGRESS NOTE  Subjective/Complaints: Patient seen sitting up in bed this morning.  He states he slept fairly overnight.  ROS: Denies CP, shortness of breath, nausea, vomiting, diarrhea.  Objective: Vital Signs: Blood pressure 131/67, pulse 91, temperature 98.5 F (36.9 C), temperature source Oral, resp. rate 18, height 6' (1.829 m), weight 95.1 kg, SpO2 97 %. No results found. Recent Labs    08/09/18 1400  WBC 9.2  HGB 9.1*  HCT 29.0*  PLT 218   Recent Labs    08/09/18 1400  NA 130*  K 5.0  CL 92*  CO2 24  GLUCOSE 211*  BUN 68*  CREATININE 11.82*  CALCIUM 8.6*    Physical Exam: BP 131/67 (BP Location: Right Arm)   Pulse 91   Temp 98.5 F (36.9 C) (Oral)   Resp 18   Ht 6' (1.829 m)   Wt 95.1 kg   SpO2 97%   BMI 28.44 kg/m  Constitutional: No distress . Vital signs reviewed. HENT: Normocephalic.  Atraumatic. Eyes: EOMI.  No discharge. Cardiovascular: No JVD. Respiratory: Normal effort.  + [place]. GI: Non-distended. Musculoskeletal: Right AKA with tenderness, unchanged Neurological: He is alert Motor: Bilateral extremities: 5/5 proximal distal Right lower extremity: Hip flexion: 4/5 (pain inhibition), unchanged Left lower extremity: 4/5 proximal to distal, unchanged Skin: + VAC.  Left lower extremity with necrotic changes.  Please see media tab.  Psychiatric:Flat and disengaged.  Confused.  Assessment/Plan: 1. Functional deficits secondary to right AKA which require 3+ hours per day of interdisciplinary therapy in a comprehensive inpatient rehab setting.  Physiatrist is providing close team supervision and 24 hour management of active medical problems listed below.  Physiatrist and rehab team continue to assess barriers to discharge/monitor patient progress toward functional and medical goals  Care Tool:  Bathing    Body parts bathed by patient: Right arm, Left arm, Chest, Abdomen, Face   Body parts bathed  by helper: Front perineal area, Buttocks, Left upper leg, Left lower leg Body parts n/a: Right lower leg   Bathing assist Assist Level: Moderate Assistance - Patient 50 - 74%     Upper Body Dressing/Undressing Upper body dressing   What is the patient wearing?: Pull over shirt    Upper body assist Assist Level: Minimal Assistance - Patient > 75%    Lower Body Dressing/Undressing Lower body dressing      What is the patient wearing?: Pants     Lower body assist Assist for lower body dressing: Maximal Assistance - Patient 25 - 49%     Toileting Toileting    Toileting assist Assist for toileting: Maximal Assistance - Patient 25 - 49%     Transfers Chair/bed transfer  Transfers assist     Chair/bed transfer assist level: Moderate Assistance - Patient 50 - 74%     Locomotion Ambulation   Ambulation assist      Assist level: Moderate Assistance - Patient 50 - 74% Assistive device: Walker-rolling Max distance: 1 hop   Walk 10 feet activity   Assist  Walk 10 feet activity did not occur: Safety/medical concerns        Walk 50 feet activity   Assist Walk 50 feet with 2 turns activity did not occur: Safety/medical concerns         Walk 150 feet activity   Assist Walk 150 feet activity did not occur: Safety/medical concerns         Walk 10 feet on uneven surface  activity  Assist Walk 10 feet on uneven surfaces activity did not occur: Safety/medical concerns         Wheelchair     Assist Will patient use wheelchair at discharge?: Yes Type of Wheelchair: Manual    Wheelchair assist level: Supervision/Verbal cueing Max wheelchair distance: 150'    Wheelchair 50 feet with 2 turns activity    Assist        Assist Level: Supervision/Verbal cueing   Wheelchair 150 feet activity     Assist     Assist Level: Supervision/Verbal cueing      Medical Problem List and Plan: 1.Functional and mobility  deficitssecondary to 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 Antithrombotics: -DVT/anticoagulation:Pharmaceutical:Heparin -antiplatelet therapy: N/A 3. Pain Management:  Continue gabapentin   Continue oxycodone prn. 4. Mood:LCSW to follow for evaluation and support. -antipsychotic agents: N/A 5. Neuropsych: This patientis fullycapable of making decisions on hisown behalf.  SLP cog eval ordered after discussion with team 6. Skin/Wound Care:Continue VAC till 5/22.   Discussed with Ortho and PA- Silvadene dressing.  Severe PVD with limited blood flow per vascular. 7. Fluids/Electrolytes/Nutrition:Strict I/O.  8. T2DM: Hemoglobin A1c 10.1 on 5/18.  Continue to monitor BS ac/hs and use SSI for elevated BS.   Glucotrol 2.5 started on 5/20  Very labile on 5/21  Monitor with increased mobility 9. ESRD: HD MWF. Schedule atend of the day to allow participation with therapies during the day and to assistwith activity tolerance.   Recs per nephro 10. Diabetic gastroparesis: On Reglan tid ac. 11. Anemia of chronic disease: Continue Aranesp weekly for supplement.   Hemoglobin 9.1 on 5/20  Continue to monitor 12. Glaucoma: Managed with Timoptic, Alphagan and Xalatan. 13.  Labile blood pressure  Extremely labile on 5/21, monitor for trend  LOS: 3 days A FACE TO FACE EVALUATION WAS PERFORMED  Bresha Hosack Lorie Phenix 08/10/2018, 2:27 PM

## 2018-08-10 NOTE — Progress Notes (Signed)
Social Work Patient ID: Howard Bell, male   DOB: [birth year], 53 y.o.   MRN: [id number]   Have reviewed team conference with pt and wife.  Both aware and agreeable with targeted d/c date of 6/3.  Of note, this would be a HD day and they are aware that we would do this prior to him leaving.  Both aware of supervision w/c level - min assist.  Will continue to follow.  Bruno Leach, LCSW

## 2018-08-10 NOTE — Progress Notes (Signed)
[facility] Progress Note   Subjective:  Seen in room, no c/o  Objective Vitals:   08/09/18 1841 08/09/18 1939 08/10/18 0315 08/10/18 0951  BP: 103/72 125/76 (!) 160/90 (!) 95/59  Pulse: 64 98 98 87  Resp: 16 18 15    Temp: 98.3 F (36.8 C) 98.4 F (36.9 C) 98 F (36.7 C)   TempSrc: Oral Oral Oral   SpO2:  95% 98% 96%  Weight:   95.1 kg   Height:       Physical Exam General: Well appearing man, NAD Heart: RRR; 2/6 systolic murmur Lungs: CTAB Extremities: R AKA bandaged; no LLE edema Dialysis Access: LUE AVF + bruit   SW MWF  4h 19min   450/2x  95.5kg   2/2.25 bath  P2  Hep 9000   L AVF Parsabiv 10mg  TIW Mircera 150 mcg IV q 2 weeks (last 5/4)  Assessment/Plan: 1. 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 Sharol Given. Now on CIR. 2. ESRD: Continue HD per MWF schedule. HD Friday 3. Hypertension/volume: BP variable, will need EDW slightly lowered on DC.   4. Anemia: Hgb 10.1. Continue Aranesp weekly. 5. Metabolic bone disease: Ca/Phos ok.Continue Ca acetate + Renvela as binder. Parsabiv not available in hospital. Follow labs.  6. T2DM: Insulin per primary.   Kelly Splinter  MD 08/10/2018, 10:40 AM  Additional Objective Labs: Basic Metabolic Panel: Recent Labs  Lab 08/04/18 0625 08/07/18 0727 08/09/18 1400  NA 130* 128* 130*  K 4.2 5.2* 5.0  CL  --  89* 92*  CO2  --  23 24  GLUCOSE 132* 99 211*  BUN  --  78* 68*  CREATININE  --  12.62* 11.82*  CALCIUM  --  8.6* 8.6*  PHOS  --  5.3* 5.4*   Liver Function Tests: Recent Labs  Lab 08/07/18 0727 08/09/18 1400  ALBUMIN 2.3* 2.4*   CBC: Recent Labs  Lab 08/04/18 0625 08/07/18 0728 08/09/18 1400  WBC  --  9.8 9.2  HGB 10.9* 10.1* 9.1*  HCT 32.0* 31.3* 29.0*  MCV  --  90.5 93.5  PLT  --  228 218   CBG: Recent Labs  Lab 08/09/18 0636 08/09/18 1144 08/09/18 1839 08/09/18 2108 08/10/18 0616  GLUCAP 143* 250* 104* 234* 136*   Medications:  . brimonidine  1 drop  Both Eyes BID  . calcium acetate  2,001 mg Oral TID WC  . Chlorhexidine Gluconate Cloth  6 each Topical Q0600  . [START ON 08/14/2018] darbepoetin (ARANESP) injection - DIALYSIS  60 mcg Intravenous Q Mon-HD  . docusate sodium  100 mg Oral BID  . doxycycline  100 mg Oral Q12H  . feeding supplement (PRO-STAT SUGAR FREE 64)  30 mL Oral BID  . fluticasone  1 spray Each Nare Daily  . gabapentin  300 mg Oral QHS  . glipiZIDE  2.5 mg Oral QAC breakfast  . heparin  5,000 Units Subcutaneous Q8H  . insulin aspart  0-5 Units Subcutaneous QHS  . insulin aspart  0-9 Units Subcutaneous TID WC  . latanoprost  1 drop Both Eyes QHS  . loratadine  10 mg Oral QPM  . metoCLOPramide  5 mg Oral TID AC  . metoprolol succinate  50 mg Oral QHS  . mupirocin ointment  1 application Nasal BID  . pantoprazole  40 mg Oral Daily  . sevelamer carbonate  800 mg Oral TID WC  . timolol  1 drop Both Eyes BID

## 2018-08-10 NOTE — Progress Notes (Signed)
Occupational Therapy Session Note  Patient Details  Name: Howard Bell MRN: [id number] Date of Birth: [birth year]  Today's Date: 08/10/2018 OT Individual Time: 3491-7915 OT Individual Time Calculation (min): 75 min    Short Term Goals: Week 1:  OT Short Term Goal 1 (Week 1): Patient will complete LB bathing and dressing with moderate assistance with AE if needed.  OT Short Term Goal 2 (Week 1): Patient will complete toileting and toilet transfers with Min assist and appropriate DME as needed.  OT Short Term Goal 3 (Week 1): Patient will complete all UB bathing and dressing with Supervision while seated on EOB or at sink.   Skilled Therapeutic Interventions/Progress Updates:    Patient sleeping in bed upon therapy arrival. Pt easily aroused with verbal and physical cueing. Pt agreeable to participate in OT treatment session. Noted that left foot bandage is leaking and nursing changed and re-dressed prior to patient transferring to wheelchair. Bed mobility was supervision with use of railings. Slideboard transfer completed with Min assist for therapist to stabilize board. Pt transferred towards left side. Complete tub/shower transfer with shower chair. Patient was able to complete stand pivot transfer with RW with Mod assist. VC for form and technique. To transfer back to wheelchair, patient was unable to complete stand pivot with RW due to height of shower chair. Slide board was used to return to chair with Mod assist for safety. Once session was complete, patient requested to transfer back to bed. Declined use of slideboard and preferred to perform a lateral scoot from chair to bed with Min assist to block left leg from sliding forward. Patient was left in bed with bed alarm set and call light within reach.   Therapy Documentation Precautions:  Precautions Precautions: Fall Precaution Comments: wound VAC surgical incision. Right AKA. 2L oxygen Restrictions Weight Bearing Restrictions:  Yes RLE Weight Bearing: Non weight bearing Pain: Pain Assessment Pain Scale: 0-10 Pain Score: 0-No pain   Therapy/Group: Individual Therapy Ailene Ravel, OTR/L,CBIS  [phone]  08/10/2018, 4:31 PM

## 2018-08-10 NOTE — Discharge Instructions (Signed)
Inpatient Rehab Discharge Instructions  [place] Discharge date and time:    Activities/Precautions/ Functional Status: Activity: activity as tolerated Diet: diabetic diet and renal diet--1200 cc fluid/day Wound Care: Keep    Functional status:  ___ No restrictions     ___ Walk up steps independently ___ 24/7 supervision/assistance   ___ Walk up steps with assistance ___ Intermittent supervision/assistance  ___ Bathe/dress independently ___ Walk with walker     ___ Bathe/dress with assistance ___ Walk Independently    ___ Shower independently ___ Walk with assistance    ___ Shower with assistance ___ No alcohol     ___ Return to work/school ________  Special Instructions:    My questions have been answered and I understand these instructions. I will adhere to these goals and the provided educational materials after my discharge from the hospital.  Patient/Caregiver Signature _______________________________ Date __________  Clinician Signature _______________________________________ Date __________  Please bring this form and your medication list with you to all your follow-up doctor's appointments.

## 2018-08-11 ENCOUNTER — Inpatient Hospital Stay (HOSPITAL_COMMUNITY): Payer: Medicare Other

## 2018-08-11 ENCOUNTER — Inpatient Hospital Stay (HOSPITAL_COMMUNITY): Payer: Medicare Other | Admitting: Occupational Therapy

## 2018-08-11 LAB — RENAL FUNCTION PANEL
Albumin: 2.4 g/dL — ABNORMAL LOW (ref 3.5–5.0)
Anion gap: 16 — ABNORMAL HIGH (ref 5–15)
BUN: 69 mg/dL — ABNORMAL HIGH (ref 6–20)
CO2: 25 mmol/L (ref 22–32)
Calcium: 9.3 mg/dL (ref 8.9–10.3)
Chloride: 92 mmol/L — ABNORMAL LOW (ref 98–111)
Creatinine, Ser: 11.06 mg/dL — ABNORMAL HIGH (ref 0.61–1.24)
GFR calc Af Amer: 6 mL/min — ABNORMAL LOW (ref >60)
GFR calc non Af Amer: 5 mL/min — ABNORMAL LOW (ref >60)
Glucose, Bld: 100 mg/dL — ABNORMAL HIGH (ref 70–99)
Phosphorus: 4.2 mg/dL (ref 2.5–4.6)
Potassium: 4.6 mmol/L (ref 3.5–5.1)
Sodium: 133 mmol/L — ABNORMAL LOW (ref 135–145)

## 2018-08-11 LAB — GLUCOSE, CAPILLARY
Glucose-Capillary: 99 mg/dL (ref 70–99)
Glucose-Capillary: 87 mg/dL (ref 70–99)
Glucose-Capillary: 76 mg/dL (ref 70–99)
Glucose-Capillary: 178 mg/dL — ABNORMAL HIGH (ref 70–99)

## 2018-08-11 LAB — CBC
HCT: 29.3 % — ABNORMAL LOW (ref 39.0–52.0)
Hemoglobin: 9.1 g/dL — ABNORMAL LOW (ref 13.0–17.0)
MCH: 29.2 pg (ref 26.0–34.0)
MCHC: 31.1 g/dL (ref 30.0–36.0)
MCV: 93.9 fL (ref 80.0–100.0)
Platelets: 226 10*3/uL (ref 150–400)
RBC: 3.12 MIL/uL — ABNORMAL LOW (ref 4.22–5.81)
RDW: 16.5 % — ABNORMAL HIGH (ref 11.5–15.5)
WBC: 9.9 10*3/uL (ref 4.0–10.5)
nRBC: 0 % (ref 0.0–0.2)

## 2018-08-11 LAB — IRON AND TIBC
Iron: 28 ug/dL — ABNORMAL LOW (ref 45–182)
Saturation Ratios: 17 % — ABNORMAL LOW (ref 17.9–39.5)
TIBC: 161 ug/dL — ABNORMAL LOW (ref 250–450)
UIBC: 133 ug/dL

## 2018-08-11 LAB — FERRITIN: Ferritin: 1306 ng/mL — ABNORMAL HIGH (ref 24–336)

## 2018-08-11 MED ORDER — HEPARIN SODIUM (PORCINE) 1000 UNIT/ML DIALYSIS
2500.0000 [IU] | INTRAMUSCULAR | Status: DC | PRN
  Filled 2018-08-11: qty 3

## 2018-08-11 MED ORDER — DARBEPOETIN ALFA 150 MCG/0.3ML IJ SOSY
150.0000 ug | PREFILLED_SYRINGE | INTRAMUSCULAR | Status: DC
  Administered 2018-08-15: 150 ug via INTRAVENOUS
  Filled 2018-08-11: qty 0.3

## 2018-08-11 MED ORDER — SORBITOL 70 % SOLN
45.0000 mL | Freq: Once | Status: AC
  Administered 2018-08-11: 45 mL via ORAL
  Filled 2018-08-11 (×2): qty 60

## 2018-08-11 MED ORDER — POLYETHYLENE GLYCOL 3350 17 G PO PACK: 17.0000 g | PACK | Freq: Every day | ORAL | Status: DC

## 2018-08-11 NOTE — Progress Notes (Signed)
Occupational Therapy Session Note  Patient Details  Name: Howard Bell MRN: [id number] Date of Birth: 1966-02-02  Today's Date: 08/11/2018 OT Individual Time: 1130-1200 OT Individual Time Calculation (min): 30 min    Short Term Goals: Week 1:  OT Short Term Goal 1 (Week 1): Patient will complete LB bathing and dressing with moderate assistance with AE if needed.  OT Short Term Goal 2 (Week 1): Patient will complete toileting and toilet transfers with Min assist and appropriate DME as needed.  OT Short Term Goal 3 (Week 1): Patient will complete all UB bathing and dressing with Supervision while seated on EOB or at sink.   Skilled Therapeutic Interventions/Progress Updates:    Pt in bed sleeping upon therapy arrival. Able to arouse patient with verbal and physical cues. Lights on for alertness. Patient reports that he didn't sleep very well last night. He states he experienced phantom limb pain. Patient was agreeable to complete bed level BUE strengthening. Pt utilized blue theraband to complete shoulder and elbow strengthening. 1 set; 12 repetitions each. Rest breaks provided as needed. Verbal and psychical cues for form and technique. Patient was left in bed, ice chip cup in hand, with bed alarm set and call light within reach.   Therapy Documentation Precautions:  Precautions Precautions: Fall Precaution Comments: wound VAC surgical incision. Right AKA. 2L oxygen Restrictions Weight Bearing Restrictions: Yes RLE Weight Bearing: Non weight bearing Pain: Pain Assessment Pain Scale: 0-10 Pain Score: 0-No pain   Therapy/Group: Individual Therapy   Ailene Ravel, OTR/L,CBIS  [phone]  08/11/2018, 12:19 PM

## 2018-08-11 NOTE — Progress Notes (Addendum)
[facility] Progress Note   Subjective:  Seen on HD - just being initiated - no c/o  Objective Vitals:   08/10/18 1851 08/10/18 1855 08/10/18 1950 08/11/18 0452  BP:   (!) 123/58 118/66  Pulse: (!) 101 97 92 84  Resp:   18 18  Temp:   98.5 F (36.9 C) 98.8 F (37.1 C)  TempSrc:   Oral Oral  SpO2: (!) 85% 95% 95% 95%  Weight:    100.2 kg  Height:       Physical Exam General: Well appearing man, NAD disengaged Heart: RRR; 2/6 systolic murmur Lungs: CTAB Abd: obese somewhat distended nontender Extremities: R AKA bandaged; no sig LLE edema but necrotic changes - sock/dressing on foot not examined Dialysis Access: LUE AVF + bruit   SW MWF  4h 47min   450/2x  95.5kg   2/2.25 bath  P2  Hep 9000   L AVF Parsabiv 10mg  TIW Mircera 150 mcg IV q 2 weeks (last 5/4)  Assessment/Plan: 1. 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 Sharol Given. Now on CIR. 2. ESRD: Continue HD per MWF schedule. HD today 3. Hypertension/volume: BP variable, will need EDW slightly lowered on DC.  net UF 3 L 5/20  4. Anemia: Hgb 10.1> 9.1  Continue Aranesp weekly.- had been on 150 mircera - 60 aranesp ordered - this is a lot less than previously - ^ to 150 next week - check Fe studies today  5. Metabolic bone disease: Ca/Phos ok.Continue Ca acetate + Renvela as binder. Parsabiv not available in hospital. Follow labs.  6. T2DM: Insulin per primary.  Alric Seton, PA 08/10/18, 1:38 PM  Kelly Splinter  MD 08/10/2018, 10:40 AM  Additional Objective Labs: Basic Metabolic Panel: Recent Labs  Lab 08/07/18 0727 08/09/18 1400  NA 128* 130*  K 5.2* 5.0  CL 89* 92*  CO2 23 24  GLUCOSE 99 211*  BUN 78* 68*  CREATININE 12.62* 11.82*  CALCIUM 8.6* 8.6*  PHOS 5.3* 5.4*   Liver Function Tests: Recent Labs  Lab 08/07/18 0727 08/09/18 1400  ALBUMIN 2.3* 2.4*   CBC: Recent Labs  Lab 08/07/18 0728 08/09/18 1400  WBC 9.8 9.2  HGB 10.1* 9.1*  HCT 31.3* 29.0*  MCV 90.5  93.5  PLT 228 218   CBG: Recent Labs  Lab 08/10/18 0616 08/10/18 1138 08/10/18 1716 08/10/18 2102 08/11/18 0622  GLUCAP 136* 127* 150* 129* 99   Medications:  . brimonidine  1 drop Both Eyes BID  . calcium acetate  2,001 mg Oral TID WC  . Chlorhexidine Gluconate Cloth  6 each Topical Q0600  . Chlorhexidine Gluconate Cloth  6 each Topical Q0600  . [START ON 08/14/2018] darbepoetin (ARANESP) injection - DIALYSIS  60 mcg Intravenous Q Mon-HD  . docusate sodium  100 mg Oral BID  . doxycycline  100 mg Oral Q12H  . feeding supplement (PRO-STAT SUGAR FREE 64)  30 mL Oral BID  . fluticasone  1 spray Each Nare Daily  . gabapentin  300 mg Oral QHS  . glipiZIDE  2.5 mg Oral QAC breakfast  . heparin  5,000 Units Subcutaneous Q8H  . insulin aspart  0-5 Units Subcutaneous QHS  . insulin aspart  0-9 Units Subcutaneous TID WC  . latanoprost  1 drop Both Eyes QHS  . loratadine  10 mg Oral QPM  . metoCLOPramide  5 mg Oral TID AC  . metoprolol succinate  50 mg Oral QHS  . pantoprazole  40 mg Oral Daily  . sevelamer carbonate  800 mg Oral TID WC  . timolol  1 drop Both Eyes BID

## 2018-08-11 NOTE — Progress Notes (Signed)
Physical Therapy Session Note  Patient Details  Name: Howard Bell MRN: [id number] Date of Birth: October 06, 1965  Today's Date: 08/11/2018 PT Individual Time: 0930-1045 PT Individual Time Calculation (min): 75 min   Short Term Goals: Week 1:  PT Short Term Goal 1 (Week 1): Patient will perform squat and stand pivot transfers with min A. PT Short Term Goal 2 (Week 1): Patient will demonstrate dynamic sitting balance with S.  PT Short Term Goal 3 (Week 1): Patient will ambulate 28' with LRAD and mod A PT Short Term Goal 4 (Week 1): Patient will perform w/c mobility S x continuous 150'   Skilled Therapeutic Interventions/Progress Updates:    Patient in supine and lethargic, aroused, but still easily falls back to sleep.  RN informed pt requesting pain medication.  Supine to sit with S with elevated HOB and increased time.  Patient scooting on slide board with min A to w/c.  Assisted to bathroom and to obtain supplies for pt to brush teeth with S.  Patient propelled w/c in hallway x 100' then around cones with max cues for technique to avoid each cone.  SBT to mat in hallway min A mod cues and A for w/c set up. Sit to supine S and performed bridging with bolster under R leg, supine hip extension, sidelying hip abduction and hip extension and measured SpO2 on 2L O2 [place] in sidelying 79% so up to sitting min A.  SpO2 back >90% with cues for PLB.  Patient transferred to R to w/c CGA.  Sit to stand to RW with mod A.  Stood with cues for posture about 15 sec.  Patient propelled w/c to room and slide board back to bed due to fatigue with min A.  Sit to supine S.  Left with call bell in reach and bed alarm activated back on in room O2.   Therapy Documentation Precautions:  Precautions Precautions: Fall Precaution Comments: wound VAC surgical incision. Right AKA. 2L oxygen Restrictions Weight Bearing Restrictions: Yes RLE Weight Bearing: Non weight bearing Pain: Pain Assessment Pain Scale: 0-10 Pain  Score: 7  Pain Type: Surgical pain Pain Location: Leg Pain Orientation: Right Pain Descriptors / Indicators: Aching Pain Intervention(s): RN made aware    Therapy/Group: Individual Therapy  Reginia Naas  [place], PT 08/11/2018, 5:07 PM

## 2018-08-11 NOTE — Progress Notes (Addendum)
[place] PHYSICAL MEDICINE & REHABILITATION PROGRESS NOTE  Subjective/Complaints: Patient seen sitting up in the morning.  States he slept well overnight.  He did not have supplemental oxygen on, but states he just took it off to eat.  Discussed with social worker yesterday cognitive evaluation.  Discussed with SLP.  ROS: Denies CP, shortness of breath, nausea, vomiting, diarrhea.  Objective: Vital Signs: Blood pressure 118/66, pulse 84, temperature 98.8 F (37.1 C), temperature source Oral, resp. rate 18, height 6' (1.829 m), weight 100.2 kg, SpO2 95 %. No results found. Recent Labs    08/09/18 1400  WBC 9.2  HGB 9.1*  HCT 29.0*  PLT 218   Recent Labs    08/09/18 1400  NA 130*  K 5.0  CL 92*  CO2 24  GLUCOSE 211*  BUN 68*  CREATININE 11.82*  CALCIUM 8.6*    Physical Exam: BP 118/66 (BP Location: Right Arm)   Pulse 84   Temp 98.8 F (37.1 C) (Oral)   Resp 18   Ht 6' (1.829 m)   Wt 100.2 kg   SpO2 95%   BMI 29.96 kg/m  Constitutional: No distress . Vital signs reviewed. HENT: Cephalic.  Atraumatic. Eyes: EOMI.  No discharge. Cardiovascular: No JVD. Respiratory: Normal effort.  GI: Non-distended. Musculoskeletal: Right AKA with tenderness, stable Neurological: He is alert Motor: Bilateral extremities: 5/5 proximal distal Right lower extremity: Hip flexion: 4/5 (pain inhibition), stable Left lower extremity: 4/5 proximal to distal, stable Skin: + VAC.  Left lower extremity with necrotic changes.  Please see media tab.  Psychiatric:Flat and disengaged.   Assessment/Plan: 1. Functional deficits secondary to right AKA which require 3+ hours per day of interdisciplinary therapy in a comprehensive inpatient rehab setting.  Physiatrist is providing close team supervision and 24 hour management of active medical problems listed below.  Physiatrist and rehab team continue to assess barriers to discharge/monitor patient progress toward functional and medical  goals  Care Tool:  Bathing    Body parts bathed by patient: Right arm, Left arm, Chest, Abdomen, Face   Body parts bathed by helper: Front perineal area, Buttocks, Left upper leg, Left lower leg Body parts n/a: Right lower leg   Bathing assist Assist Level: Moderate Assistance - Patient 50 - 74%     Upper Body Dressing/Undressing Upper body dressing   What is the patient wearing?: Pull over shirt    Upper body assist Assist Level: Minimal Assistance - Patient > 75%    Lower Body Dressing/Undressing Lower body dressing      What is the patient wearing?: Pants     Lower body assist Assist for lower body dressing: Maximal Assistance - Patient 25 - 49%     Toileting Toileting    Toileting assist Assist for toileting: Maximal Assistance - Patient 25 - 49%     Transfers Chair/bed transfer  Transfers assist     Chair/bed transfer assist level: Minimal Assistance - Patient > 75%(slideboard)     Locomotion Ambulation   Ambulation assist      Assist level: Moderate Assistance - Patient 50 - 74% Assistive device: Walker-rolling Max distance: 1 hop   Walk 10 feet activity   Assist  Walk 10 feet activity did not occur: Safety/medical concerns        Walk 50 feet activity   Assist Walk 50 feet with 2 turns activity did not occur: Safety/medical concerns         Walk 150 feet activity   Assist Walk 150  feet activity did not occur: Safety/medical concerns         Walk 10 feet on uneven surface  activity   Assist Walk 10 feet on uneven surfaces activity did not occur: Safety/medical concerns         Wheelchair     Assist Will patient use wheelchair at discharge?: Yes Type of Wheelchair: Manual    Wheelchair assist level: Supervision/Verbal cueing Max wheelchair distance: 150'    Wheelchair 50 feet with 2 turns activity    Assist        Assist Level: Supervision/Verbal cueing   Wheelchair 150 feet activity      Assist     Assist Level: Supervision/Verbal cueing      Medical Problem List and Plan: 1.Functional and mobility deficitssecondary to right BKA/PAD with revision to right AKA  Continue CIR  Wean supplemental oxygen as tolerated,?  Continues to require 2. Antithrombotics: -DVT/anticoagulation:Pharmaceutical:Heparin -antiplatelet therapy: N/A 3. Pain Management:  Continue gabapentin   Continue oxycodone prn. 4. Mood:LCSW to follow for evaluation and support. -antipsychotic agents: N/A 5. Neuropsych: This patientis fullycapable of making decisions on hisown behalf.  SLP cog eval ordered 6. Skin/Wound Care:DC VAC today.  Discussed with nursing.  Discussed with Ortho and PA- Silvadene dressing.  Severe PVD with limited blood flow per vascular. 7. Fluids/Electrolytes/Nutrition:Strict I/O.  8. T2DM: Hemoglobin A1c 10.1 on 5/18.  Continue to monitor BS ac/hs and use SSI for elevated BS.   Glucotrol 2.5 started on 5/20  Improving on 5/22  Monitor with increased mobility 9. ESRD: HD MWF. Schedule atend of the day to allow participation with therapies during the day and to assistwith activity tolerance.   Recs per nephro 10. Diabetic gastroparesis: On Reglan tid ac. 11. Anemia of chronic disease: Continue Aranesp weekly for supplement.   Hemoglobin 9.1 on 5/20  Continue to monitor 12. Glaucoma: Managed with Timoptic, Alphagan and Xalatan. 13.  Labile blood pressure  Relatively controlled on 5/22  LOS: 4 days A FACE TO FACE EVALUATION WAS PERFORMED  Kristy Catoe Lorie Phenix 08/11/2018, 12:35 PM

## 2018-08-11 NOTE — Progress Notes (Signed)
Occupational Therapy Session Note  Patient Details  Name: Howard Bell MRN: [id number] Date of Birth: 1966/02/12  Today's Date: 08/11/2018 OT Individual Time: 5436-0677 OT Individual Time Calculation (min): 40 min (missed 20 mins due to fatigue)   Short Term Goals: Week 1:  OT Short Term Goal 1 (Week 1): Patient will complete LB bathing and dressing with moderate assistance with AE if needed.  OT Short Term Goal 2 (Week 1): Patient will complete toileting and toilet transfers with Min assist and appropriate DME as needed.  OT Short Term Goal 3 (Week 1): Patient will complete all UB bathing and dressing with Supervision while seated on EOB or at sink.   Skilled Therapeutic Interventions/Progress Updates:    Treatment session limited due to fatigue and difficulty maintaining arousal.  Pt asleep upon arrival, requiring increased time to fully arouse.  Pt declining bathing/dressing this session but agreeable to BUE strengthening at bed level as he reports he did not sleep well last night.  Engaged in [place] with 4# and 6# weights with focus on bicep and tricep strengthening as needed for functional mobility and transfers.  Pt required frequent rest breaks due to decreased ability to maintain arousal, eyes closing frequently.  Also noted pt to have increased UE jerkiness with movements.  Pt remained semi-reclined in bed with lights on for arousal, but pt still nodding off.  Missed 20 mins due to fatigue.  Therapy Documentation Precautions:  Precautions Precautions: Fall Precaution Comments: wound VAC surgical incision. Right AKA. 2L oxygen Restrictions Weight Bearing Restrictions: Yes RLE Weight Bearing: Non weight bearing General: General OT Amount of Missed Time: 20 Minutes Pain: Pain Assessment Pain Scale: 0-10 Pain Score: 0-No pain Pain Type: Surgical pain Pain Location: Leg Pain Orientation: Right Pain Descriptors / Indicators: Aching;Discomfort Pain Frequency:  Intermittent Pain Onset: Gradual Patients Stated Pain Goal: 2 Pain Intervention(s): Medication (See eMAR)   Therapy/Group: Individual Therapy  Simonne Come 08/11/2018, 12:24 PM

## 2018-08-12 ENCOUNTER — Inpatient Hospital Stay (HOSPITAL_COMMUNITY): Payer: Medicare Other | Admitting: Physical Therapy

## 2018-08-12 ENCOUNTER — Inpatient Hospital Stay (HOSPITAL_COMMUNITY): Payer: Medicare Other | Admitting: Occupational Therapy

## 2018-08-12 LAB — GLUCOSE, CAPILLARY
Glucose-Capillary: 112 mg/dL — ABNORMAL HIGH (ref 70–99)
Glucose-Capillary: 93 mg/dL (ref 70–99)
Glucose-Capillary: 87 mg/dL (ref 70–99)
Glucose-Capillary: 123 mg/dL — ABNORMAL HIGH (ref 70–99)

## 2018-08-12 MED ORDER — SENNOSIDES-DOCUSATE SODIUM 8.6-50 MG PO TABS
2.0000 | ORAL_TABLET | Freq: Two times a day (BID) | ORAL | Status: DC
  Administered 2018-08-12 (×2): 2 via ORAL
  Filled 2018-08-12 (×2): qty 2

## 2018-08-12 MED ORDER — SODIUM CHLORIDE 0.9 % IV SOLN
125.0000 mg | INTRAVENOUS | Status: DC
  Administered 2018-08-15: 125 mg via INTRAVENOUS
  Filled 2018-08-12 (×2): qty 10

## 2018-08-12 NOTE — Progress Notes (Signed)
SLP Cancellation Note  Patient Details Name: Howard Bell MRN: [id number] DOB: 10/05/1965   Cancelled treatment:        Pt with PT when SLE was attempted; will attempt at later date as able.                                                                                                Elvina Sidle, M.S., [place] 08/12/2018, 11:19 AM

## 2018-08-12 NOTE — Progress Notes (Signed)
[place] PHYSICAL MEDICINE & REHABILITATION PROGRESS NOTE  Subjective/Complaints: Patient in bed this morning denies any issues other than constipation.  Has tried some laxatives but they have not worked.  No abdominal pain.  ROS: Denies CP, shortness of breath, nausea, vomiting, diarrhea.  Objective: Vital Signs: Blood pressure 114/67, pulse 92, temperature 98.3 F (36.8 C), temperature source Oral, resp. rate 19, height 6' (1.829 m), weight 94.6 kg, SpO2 97 %. No results found. Recent Labs    08/09/18 1400 08/11/18 1330  WBC 9.2 9.9  HGB 9.1* 9.1*  HCT 29.0* 29.3*  PLT 218 226   Recent Labs    08/09/18 1400 08/11/18 1330  NA 130* 133*  K 5.0 4.6  CL 92* 92*  CO2 24 25  GLUCOSE 211* 100*  BUN 68* 69*  CREATININE 11.82* 11.06*  CALCIUM 8.6* 9.3    Physical Exam: BP 114/67 (BP Location: Right Arm)   Pulse 92   Temp 98.3 F (36.8 C) (Oral)   Resp 19   Ht 6' (1.829 m)   Wt 94.6 kg   SpO2 97%   BMI 28.29 kg/m  Constitutional: No distress . Vital signs reviewed. HENT: Cephalic.  Atraumatic. Eyes: EOMI.  No discharge. Cardiovascular: No JVD. Respiratory: Normal effort.  GI: Non-distended.  No tenderness to palpation, decreased bowel sounds Musculoskeletal: Right AKA with tenderness, stable Neurological: He is alert Motor: Bilateral extremities: 5/5 proximal distal Right lower extremity: Hip flexion: 4/5 (pain inhibition), stable Left lower extremity: 4/5 proximal to distal, stable Skin: + VAC.  Left lower extremity with necrotic changes.  Please see media tab.  Psychiatric:Flat and disengaged.   Assessment/Plan: 1. Functional deficits secondary to right AKA which require 3+ hours per day of interdisciplinary therapy in a comprehensive inpatient rehab setting.  Physiatrist is providing close team supervision and 24 hour management of active medical problems listed below.  Physiatrist and rehab team continue to assess barriers to discharge/monitor patient  progress toward functional and medical goals  Care Tool:  Bathing    Body parts bathed by patient: Right arm, Left arm, Chest, Abdomen, Face   Body parts bathed by helper: Front perineal area, Buttocks, Left upper leg, Left lower leg Body parts n/a: Right lower leg   Bathing assist Assist Level: Moderate Assistance - Patient 50 - 74%     Upper Body Dressing/Undressing Upper body dressing   What is the patient wearing?: Pull over shirt    Upper body assist Assist Level: Minimal Assistance - Patient > 75%    Lower Body Dressing/Undressing Lower body dressing      What is the patient wearing?: Pants     Lower body assist Assist for lower body dressing: Maximal Assistance - Patient 25 - 49%     Toileting Toileting    Toileting assist Assist for toileting: Maximal Assistance - Patient 25 - 49%     Transfers Chair/bed transfer  Transfers assist     Chair/bed transfer assist level: Minimal Assistance - Patient > 75%(SBT)     Locomotion Ambulation   Ambulation assist      Assist level: Moderate Assistance - Patient 50 - 74% Assistive device: Walker-rolling Max distance: 1 hop   Walk 10 feet activity   Assist  Walk 10 feet activity did not occur: Safety/medical concerns        Walk 50 feet activity   Assist Walk 50 feet with 2 turns activity did not occur: Safety/medical concerns         Walk 150  feet activity   Assist Walk 150 feet activity did not occur: Safety/medical concerns         Walk 10 feet on uneven surface  activity   Assist Walk 10 feet on uneven surfaces activity did not occur: Safety/medical concerns         Wheelchair     Assist Will patient use wheelchair at discharge?: Yes Type of Wheelchair: Manual    Wheelchair assist level: Supervision/Verbal cueing Max wheelchair distance: 100'    Wheelchair 50 feet with 2 turns activity    Assist        Assist Level: Supervision/Verbal cueing    Wheelchair 150 feet activity     Assist     Assist Level: Supervision/Verbal cueing      Medical Problem List and Plan: 1.Functional and mobility deficitssecondary to 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  Continues to require 2. Antithrombotics: -DVT/anticoagulation:Pharmaceutical:Heparin -antiplatelet therapy: N/A 3. Pain Management:  Continue gabapentin   Continue oxycodone prn. 4. Mood:LCSW to follow for evaluation and support. -antipsychotic agents: N/A 5. Neuropsych: This patientis fullycapable of making decisions on hisown behalf.  SLP cog eval ordered 6. Skin/Wound Care:DC VAC today.  Discussed with nursing.  Discussed with Ortho and PA- Silvadene dressing.  Severe PVD with limited blood flow per vascular. 7. Fluids/Electrolytes/Nutrition:Strict I/O.  8. T2DM: Hemoglobin A1c 10.1 on 5/18.  Continue to monitor BS ac/hs and use SSI for elevated BS.   Glucotrol 2.5 started on 5/20  Improving on 5/22  Monitor with increased mobility 9. ESRD: HD MWF. Schedule atend of the day to allow participation with therapies during the day and to assistwith activity tolerance.   Recs per nephro 10. Diabetic gastroparesis: On Reglan tid ac. 11. Anemia of chronic disease: Continue Aranesp weekly for supplement.   Hemoglobin 9.1 on 5/20  Continue to monitor 12. Glaucoma: Managed with Timoptic, Alphagan and Xalatan. 13.  Labile blood pressure  Relatively controlled on 5/22 14.  Constipation immobility with opioids, on MiraLAX daily will DC and change to senna LOS: 5 days A FACE TO FACE EVALUATION WAS PERFORMED  Charlett Blake 08/12/2018, 7:48 AM

## 2018-08-12 NOTE — Progress Notes (Addendum)
[facility] Progress Note   Subjective:  Had stool yesterday. Denies problems with HD  Objective Vitals:   08/11/18 1630 08/11/18 1700 08/11/18 1953 08/12/18 0409  BP: (!) 145/78 (!) 116/98 130/85 114/67  Pulse: (!) 112 86 99 92  Resp:  18 18 19   Temp:  (!) 97.5 F (36.4 C) (!) 100.8 F (38.2 C) 98.3 F (36.8 C)  TempSrc:  Oral Oral Oral  SpO2:  95% 94% 97%  Weight:  98.5 kg  94.6 kg  Height:       Physical Exam General: Well appearing man, NAD disengaged Heart: RRR; 2/6 systolic murmur Lungs: CTAB Abd: obese somewhat distended nontender Extremities: R AKA bandaged; no sig LLE edema but necrotic changes - sock/dressing on foot not examined Dialysis Access: LUE AVF + bruit   SW MWF  4h 52min   450/2x  95.5kg   2/2.25 bath  P2  Hep 9000   L AVF Parsabiv 10mg  TIW Mircera 150 mcg IV q 2 weeks (last 5/4)  Assessment/Plan: 1. 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 Sharol Given. Now on CIR. 2. ESRD: Continue HD per MWF schedule. Next HD Monday 3. Hypertension/volume: BP variable, will need EDW slightly lowered on DC.  net UF 3 L 5/20 net UF 2 L 5/22 > 98.5- doesn't make sense - above pre HD wt - am weight much lower - BP ok - titrate to accurate new EDW 4. Anemia: Hgb 10.1> 9.1  Continue Aranesp weekly.- had been on 150 mircera - 60 aranesp ordered - this is a lot less than previously - ^ to 150 next week - check Fe studies !7% sat ferritin 1306 - will give short course IV Fe  5. Metabolic bone disease: Ca/Phos ok.Continue Ca acetate + Renvela as binder. Parsabiv not available in hospital. Follow labs.  6. T2DM: Insulin per primary.  Alric Seton, PA 08/12/18, 9:44 AM  Pt seen, examined and agree w A/P as above.  Kelly Splinter  MD 08/12/2018, 11:40 AM    Additional Objective Labs: Basic Metabolic Panel: Recent Labs  Lab 08/07/18 0727 08/09/18 1400 08/11/18 1330  NA 128* 130* 133*  K 5.2* 5.0 4.6  CL 89* 92* 92*  CO2 23 24 25    GLUCOSE 99 211* 100*  BUN 78* 68* 69*  CREATININE 12.62* 11.82* 11.06*  CALCIUM 8.6* 8.6* 9.3  PHOS 5.3* 5.4* 4.2   Liver Function Tests: Recent Labs  Lab 08/07/18 0727 08/09/18 1400 08/11/18 1330  ALBUMIN 2.3* 2.4* 2.4*   CBC: Recent Labs  Lab 08/07/18 0728 08/09/18 1400 08/11/18 1330  WBC 9.8 9.2 9.9  HGB 10.1* 9.1* 9.1*  HCT 31.3* 29.0* 29.3*  MCV 90.5 93.5 93.9  PLT 228 218 226   CBG: Recent Labs  Lab 08/11/18 0622 08/11/18 1208 08/11/18 1818 08/11/18 2108 08/12/18 0605  GLUCAP 99 87 76 178* 112*   Medications:  . brimonidine  1 drop Both Eyes BID  . calcium acetate  2,001 mg Oral TID WC  . Chlorhexidine Gluconate Cloth  6 each Topical Q0600  . [START ON 08/14/2018] darbepoetin (ARANESP) injection - DIALYSIS  150 mcg Intravenous Q Mon-HD  . docusate sodium  100 mg Oral BID  . doxycycline  100 mg Oral Q12H  . feeding supplement (PRO-STAT SUGAR FREE 64)  30 mL Oral BID  . fluticasone  1 spray Each Nare Daily  . gabapentin  300 mg Oral QHS  . glipiZIDE  2.5 mg Oral QAC breakfast  .  heparin  5,000 Units Subcutaneous Q8H  . insulin aspart  0-5 Units Subcutaneous QHS  . insulin aspart  0-9 Units Subcutaneous TID WC  . latanoprost  1 drop Both Eyes QHS  . loratadine  10 mg Oral QPM  . metoCLOPramide  5 mg Oral TID AC  . metoprolol succinate  50 mg Oral QHS  . pantoprazole  40 mg Oral Daily  . senna-docusate  2 tablet Oral BID  . sevelamer carbonate  800 mg Oral TID WC  . timolol  1 drop Both Eyes BID

## 2018-08-12 NOTE — Progress Notes (Signed)
Occupational Therapy Session Note  Patient Details  Name: Howard Bell MRN: [id number] Date of Birth: [birth year]  Today's Date: 08/12/2018 OT Individual Time: 4158-3094 OT Individual Time Calculation (min): 58 min    Short Term Goals: Week 1:  OT Short Term Goal 1 (Week 1): Patient will complete LB bathing and dressing with moderate assistance with AE if needed.  OT Short Term Goal 2 (Week 1): Patient will complete toileting and toilet transfers with Min assist and appropriate DME as needed.  OT Short Term Goal 3 (Week 1): Patient will complete all UB bathing and dressing with Supervision while seated on EOB or at sink.   Skilled Therapeutic Interventions/Progress Updates:    Patient in bed, flat affect, pleasant and cooperative.  Set up needed for breakfast tray.  Bathing completed at bed level with set up and cues for thoroughness.  UB dressing with min A.  LB dressing with mod A.  Dependent to pull up pants in stance at side of bed with walker.  Supine to/from SSP with min A.  Sit to stand with RW max A - able to maintain for 30 seconds with CGA for clothing management.  Practiced stance another attempt for conditioning and completed side step with min A.  Patient remained in bed at close of session with bed alarm set and call bell in reach.    Therapy Documentation Precautions:  Precautions Precautions: Fall Precaution Comments: wound VAC surgical incision. Right AKA. 2L oxygen Restrictions Weight Bearing Restrictions: Yes RLE Weight Bearing: Non weight bearing General:   Vital Signs: Therapy Vitals Pulse Rate: 85 Patient Position (if appropriate): Sitting Oxygen Therapy SpO2: 94 % O2 Device: Nasal Cannula O2 Flow Rate (L/min): 2 L/min Patient Activity (if Appropriate): In chair Pain: Pain Assessment Pain Scale: 0-10 Pain Score: 8  Pain Type: Surgical pain;Neuropathic pain Pain Location: Leg Pain Orientation: Right Pain Descriptors / Indicators: Aching Pain  Frequency: Intermittent Pain Onset: With Activity Patients Stated Pain Goal: 2 Pain Intervention(s): Repositioned;RN made aware   Other Treatments:     Therapy/Group: Individual Therapy  Carlos Levering 08/12/2018, 12:18 PM

## 2018-08-12 NOTE — Progress Notes (Signed)
Physical Therapy Session Note  Patient Details  Name: Howard Bell MRN: [id number] Date of Birth: 10/28/1965  Today's Date: 08/12/2018  PT Individual Time: 1100-1200  AND 1410-1510 PT Individual Time Calculation (min): 60 min 60 min   Short Term Goals: Week 1:  PT Short Term Goal 1 (Week 1): Patient will perform squat and stand pivot transfers with min A. PT Short Term Goal 2 (Week 1): Patient will demonstrate dynamic sitting balance with S.  PT Short Term Goal 3 (Week 1): Patient will ambulate 95' with LRAD and mod A PT Short Term Goal 4 (Week 1): Patient will perform w/c mobility S x continuous 150'   Skilled Therapeutic Interventions/Progress Updates:  Session 1  Pt received supine in bed, asleep. Pt aroused easily, and agreeable to PT.  PT assessed Vital signs supine in bed. HR 83, SpO2 84-84%, with no change using pursed lip breathing. PT applied supplemental O2 at 2L/min and increased to 94%. Supine>sit transfer with mod assist and moderate cues for use of bed features. Pt requesting assist from PT to pull up into sitting with UE.  Squat pivot transfer to [facility] with mod assist, pt declining use of SB; moderate cues for UE placement and.  WC mobility x 117f with supervision assist from PT. Min cues for improved turning technique and use of momentum with each push to reduce overall energy use.  Nustep BUE and LLE endurance training x 5 min + 3 mintues with prolonged rest break between bouts. SpO2 measured intermittently 88-94% with mild reports of SOB on exertion. SB transfer to and from nustep with min assist and moderate cues for sequencing, UE placement and improved LE activation and posture. Additional WC mobility to weave through 6 cones x 2 with supervision assist and min-mod cues for awareness of obstacles and improved turning technique to reduce turning radius.  Pt returned to room and performed SB transfer to bed with min assist and moderate cues for technique and safety.  Sit>supine completed with supervision assist, and left supine in bed with call bell in reach and all needs met.     session 2  Pt received supine in bed and agreeable to PT. Supine>sit transfer with supervision assist and heavy use of bed rails. Sitting balance EOB x 3 minutes for NT to assess vital signs. SB transfer to [facility] WC mobility x 1069fwith supervision assist from PT with min cues for doorway management.  SB transfer to mat table with CGA and min cues for UE placement and improved pelvic rotation on board to improve safety.   Bed level therex.  Supine SLR x 10, hip abduction x 10 . Bridge 2 x 5 with support under R thigh, sidelying hip extension 2 x 8. Cue for full ROM, proper speed and eccentrics and decreased compensations through trunk.  Sit<>stnad x 2 from mat table with min-mod assist and min cues for set up. Standing tolerance x 45 sec with min assist to improve upright posture and cues for breathing. Stand pivot transfer [facility] for improved use of UE to allow pivot of LLE.  SpO2 monitored throughout treatment intermittient 96-100% on 2L/min supplemental O2  [facility] returned to [facility] and [facility] and all needs met.             Therapy Documentation Precautions:  Precautions Precautions: Fall Precaution Comments: wound VAC surgical incision. Right AKA. 2L oxygen Restrictions Weight Bearing Restrictions: Yes RLE  Weight Bearing: Non weight bearing    Vital Signs: Therapy Vitals Pulse Rate: 83 Patient Position (if appropriate): Lying Oxygen Therapy SpO2: (!) 83 % O2 Device: Room Air Patient Activity (if Appropriate): In bed Pain: Pain Assessment Pain Scale: 0-10 Pain Score: 7  Pain Type: Surgical pain;Neuropathic pain Pain Location: Leg Pain Orientation: Right Pain Descriptors / Indicators: Aching Pain Frequency: Intermittent Pain Onset: With Activity Patients Stated Pain Goal: 2 Pain  Intervention(s): Medication (See eMAR);Repositioned   Therapy/Group: Individual Therapy  Lorie Phenix 08/12/2018, 12:07 PM

## 2018-08-13 LAB — GLUCOSE, CAPILLARY
Glucose-Capillary: 115 mg/dL — ABNORMAL HIGH (ref 70–99)
Glucose-Capillary: 109 mg/dL — ABNORMAL HIGH (ref 70–99)
Glucose-Capillary: 73 mg/dL (ref 70–99)
Glucose-Capillary: 78 mg/dL (ref 70–99)

## 2018-08-13 MED ORDER — OXYCODONE HCL 5 MG PO TABS
5.0000 mg | ORAL_TABLET | ORAL | Status: DC | PRN
  Administered 2018-08-13 – 2018-08-14 (×2): 10 mg via ORAL
  Filled 2018-08-13 (×2): qty 2

## 2018-08-13 MED ORDER — CHLORHEXIDINE GLUCONATE CLOTH 2 % EX PADS: 6.0000 | MEDICATED_PAD | Freq: Every day | CUTANEOUS | Status: DC

## 2018-08-13 MED ORDER — SENNOSIDES-DOCUSATE SODIUM 8.6-50 MG PO TABS
3.0000 | ORAL_TABLET | Freq: Two times a day (BID) | ORAL | Status: DC
  Administered 2018-08-13 – 2018-08-15 (×5): 3 via ORAL
  Filled 2018-08-13 (×5): qty 3

## 2018-08-13 NOTE — Progress Notes (Signed)
[place] PHYSICAL MEDICINE & REHABILITATION PROGRESS NOTE  Subjective/Complaints: Patient states he is constipated but after discussion with RN and LPN he is having bowel movements daily however they are quite hard  Intermittently confused usually after he receives his pain medication, per nursing he wanted to get his jeans on because somebody was waiting for him this morning  ROS: Denies CP, shortness of breath, nausea, vomiting, diarrhea.  Objective: Vital Signs: Blood pressure (!) 125/99, pulse 84, temperature 98.1 F (36.7 C), temperature source Oral, resp. rate 18, height 6' (1.829 m), weight 96.5 kg, SpO2 93 %. No results found. Recent Labs    08/11/18 1330  WBC 9.9  HGB 9.1*  HCT 29.3*  PLT 226   Recent Labs    08/11/18 1330  NA 133*  K 4.6  CL 92*  CO2 25  GLUCOSE 100*  BUN 69*  CREATININE 11.06*  CALCIUM 9.3    Physical Exam: BP (!) 125/99 (BP Location: Right Arm)   Pulse 84   Temp 98.1 F (36.7 C) (Oral)   Resp 18   Ht 6' (1.829 m)   Wt 96.5 kg   SpO2 93%   BMI 28.85 kg/m  Constitutional: No distress . Vital signs reviewed. HENT: Cephalic.  Atraumatic. Eyes: EOMI.  No discharge. Cardiovascular: No JVD. Respiratory: Normal effort.  GI: Non-distended.  No tenderness to palpation, decreased bowel sounds Musculoskeletal: Right AKA with tenderness, stable Neurological: He is alert Motor: Bilateral extremities: 5/5 proximal distal Right lower extremity: Hip flexion: 4/5 (pain inhibition), stable Left lower extremity: 4/5 proximal to distal, stable Skin: + VAC.  Left lower extremity with necrotic changes.  Please see media tab.  Psychiatric:Flat and disengaged.,  Oriented to person place but not time  Assessment/Plan: 1. Functional deficits secondary to right AKA which require 3+ hours per day of interdisciplinary therapy in a comprehensive inpatient rehab setting.  Physiatrist is providing close team supervision and 24 hour management of active  medical problems listed below.  Physiatrist and rehab team continue to assess barriers to discharge/monitor patient progress toward functional and medical goals  Care Tool:  Bathing    Body parts bathed by patient: Right arm, Left arm, Chest, Abdomen, Face, Front perineal area, Buttocks, Right upper leg, Left upper leg   Body parts bathed by helper: Front perineal area, Buttocks, Left upper leg, Left lower leg Body parts n/a: Right lower leg   Bathing assist Assist Level: Set up assist     Upper Body Dressing/Undressing Upper body dressing   What is the patient wearing?: Pull over shirt    Upper body assist Assist Level: Minimal Assistance - Patient > 75%    Lower Body Dressing/Undressing Lower body dressing      What is the patient wearing?: Pants, Underwear/pull up     Lower body assist Assist for lower body dressing: Moderate Assistance - Patient 50 - 74%     Toileting Toileting    Toileting assist Assist for toileting: Maximal Assistance - Patient 25 - 49%     Transfers Chair/bed transfer  Transfers assist     Chair/bed transfer assist level: Minimal Assistance - Patient > 75%     Locomotion Ambulation   Ambulation assist      Assist level: Moderate Assistance - Patient 50 - 74% Assistive device: Walker-rolling Max distance: 1 hop   Walk 10 feet activity   Assist  Walk 10 feet activity did not occur: Safety/medical concerns        Walk 50 feet  activity   Assist Walk 50 feet with 2 turns activity did not occur: Safety/medical concerns         Walk 150 feet activity   Assist Walk 150 feet activity did not occur: Safety/medical concerns         Walk 10 feet on uneven surface  activity   Assist Walk 10 feet on uneven surfaces activity did not occur: Safety/medical concerns         Wheelchair     Assist Will patient use wheelchair at discharge?: Yes Type of Wheelchair: Manual    Wheelchair assist level:  Supervision/Verbal cueing Max wheelchair distance: 150    Wheelchair 50 feet with 2 turns activity    Assist        Assist Level: Supervision/Verbal cueing   Wheelchair 150 feet activity     Assist     Assist Level: Supervision/Verbal cueing      Medical Problem List and Plan: 1.Functional and mobility deficitssecondary to 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  Continues to require 2. Antithrombotics: -DVT/anticoagulation:Pharmaceutical:Heparin -antiplatelet therapy: N/A 3. Pain Management:  Continue gabapentin   Continue oxycodone prn. Intermittent confusion likely related to medications may be able to reduce 4. Mood:LCSW to follow for evaluation and support. -antipsychotic agents: N/A 5. Neuropsych: This patientis fullycapable of making decisions on hisown behalf.  SLP cog eval ordered 6. Skin/Wound Care:DC VAC today.  Discussed with nursing.  Discussed with Ortho and PA- Silvadene dressing.  Severe PVD with limited blood flow per vascular. 7. Fluids/Electrolytes/Nutrition:Strict I/O.  8. T2DM: Hemoglobin A1c 10.1 on 5/18.  Continue to monitor BS ac/hs and use SSI for elevated BS.   Glucotrol 2.5 started on 5/20  Improving on 5/22  Monitor with increased mobility 9. ESRD: HD MWF. Schedule atend of the day to allow participation with therapies during the day and to assistwith activity tolerance.   Recs per nephro 10. Diabetic gastroparesis: On Reglan tid ac. 11. Anemia of chronic disease: Continue Aranesp weekly for supplement.   Hemoglobin 9.1 on 5/20  Continue to monitor 12. Glaucoma: Managed with Timoptic, Alphagan and Xalatan. 13.  Labile blood pressure  Relatively controlled on 5/24 Blood pressure (!) 125/99, pulse 84, temperature 98.1 F (36.7 C), temperature source Oral, resp. rate 18, height 6' (1.829 m), weight 96.5 kg, SpO2 93 %.  14.   Constipation immobility with opioids, increase senna LOS: 6 days A FACE TO FACE EVALUATION WAS PERFORMED  Charlett Blake 08/13/2018, 7:46 AM

## 2018-08-13 NOTE — Progress Notes (Addendum)
[facility] Progress Note   Subjective: c/o breakfast - ate about half  Objective Vitals:   08/12/18 1424 08/12/18 2002 08/13/18 0500 08/13/18 0550  BP: 127/69 116/67  (!) 125/99  Pulse: 82 85  84  Resp: (!) 22 20  18   Temp: 98.1 F (36.7 C) 97.9 F (36.6 C)  98.1 F (36.7 C)  TempSrc: Oral Oral  Oral  SpO2: 95% 98%  93%  Weight:   96.5 kg   Height:       Physical Exam General: obese male NAD flat affect - breathing easily on room air Heart: RRR; 2/6 systolic murmur Lungs: CTAB Abd: obese somewhat distended nontender Extremities: R AKA bandaged; no sig LLE edema but necrotic changes - sock/dressing on foot not examined Dialysis Access: LUE AVF + bruit   SW MWF  4h 79min   450/2x  95.5kg   2/2.25 bath  P2  Hep 9000   L AVF Parsabiv 10mg  TIW Mircera 150 mcg IV q 2 weeks (last 5/4)  Assessment/Plan: 1. 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 Sharol Given. Now on CIR. 2. ESRD: Continue HD per MWF schedule. Next HD Monday 3. Hypertension/volume: BP ok will need EDW slightly lowered on DC.  net UF 3 L 5/20 net UF 2 L 5/22 > 98.5- doesn't make sense - above pre HD wt - will need to try to get accurate wt prior to d/c 4. Anemia: Hgb 10.1> 9.1  Continue Aranesp weekly.- had been on 150 mircera - 60 aranesp ordered - this is a lot less than previously - ^ to 150 next week - check Fe studies !7% sat ferritin 1306 - will give short course IV Fe  5. Metabolic bone disease: Ca/Phos ok.Continue Ca acetate + Renvela as binder. Parsabiv not available in hospital. Follow labs.  6. T2DM: Insulin per primary.  Alric Seton, PA 08/13/18, 8:44 AM  Pt seen, examined and agree w A/P as above.  Kelly Splinter  MD 08/13/2018, 1:02 PM    Additional Objective Labs: Basic Metabolic Panel: Recent Labs  Lab 08/07/18 0727 08/09/18 1400 08/11/18 1330  NA 128* 130* 133*  K 5.2* 5.0 4.6  CL 89* 92* 92*  CO2 23 24 25   GLUCOSE 99 211* 100*  BUN 78* 68* 69*   CREATININE 12.62* 11.82* 11.06*  CALCIUM 8.6* 8.6* 9.3  PHOS 5.3* 5.4* 4.2   Liver Function Tests: Recent Labs  Lab 08/07/18 0727 08/09/18 1400 08/11/18 1330  ALBUMIN 2.3* 2.4* 2.4*   CBC: Recent Labs  Lab 08/07/18 0728 08/09/18 1400 08/11/18 1330  WBC 9.8 9.2 9.9  HGB 10.1* 9.1* 9.1*  HCT 31.3* 29.0* 29.3*  MCV 90.5 93.5 93.9  PLT 228 218 226   CBG: Recent Labs  Lab 08/12/18 0605 08/12/18 1209 08/12/18 1652 08/12/18 2111 08/13/18 0614  GLUCAP 112* 93 87 123* 115*   Medications: . [START ON 08/14/2018] ferric gluconate (FERRLECIT/NULECIT) IV     . brimonidine  1 drop Both Eyes BID  . calcium acetate  2,001 mg Oral TID WC  . Chlorhexidine Gluconate Cloth  6 each Topical Q0600  . [START ON 08/14/2018] darbepoetin (ARANESP) injection - DIALYSIS  150 mcg Intravenous Q Mon-HD  . docusate sodium  100 mg Oral BID  . doxycycline  100 mg Oral Q12H  . feeding supplement (PRO-STAT SUGAR FREE 64)  30 mL Oral BID  . fluticasone  1 spray Each Nare Daily  . gabapentin  300 mg Oral QHS  .  glipiZIDE  2.5 mg Oral QAC breakfast  . heparin  5,000 Units Subcutaneous Q8H  . insulin aspart  0-5 Units Subcutaneous QHS  . insulin aspart  0-9 Units Subcutaneous TID WC  . latanoprost  1 drop Both Eyes QHS  . loratadine  10 mg Oral QPM  . metoCLOPramide  5 mg Oral TID AC  . metoprolol succinate  50 mg Oral QHS  . pantoprazole  40 mg Oral Daily  . senna-docusate  3 tablet Oral BID  . sevelamer carbonate  800 mg Oral TID WC  . timolol  1 drop Both Eyes BID

## 2018-08-14 ENCOUNTER — Inpatient Hospital Stay (HOSPITAL_COMMUNITY): Payer: Medicare Other | Admitting: Physical Therapy

## 2018-08-14 ENCOUNTER — Inpatient Hospital Stay (HOSPITAL_COMMUNITY): Payer: Medicare Other | Admitting: Occupational Therapy

## 2018-08-14 ENCOUNTER — Inpatient Hospital Stay (HOSPITAL_COMMUNITY): Payer: Medicare Other

## 2018-08-14 DIAGNOSIS — E162 Hypoglycemia, unspecified: Secondary | ICD-10-CM

## 2018-08-14 DIAGNOSIS — K5903 Drug induced constipation: Secondary | ICD-10-CM

## 2018-08-14 LAB — GLUCOSE, CAPILLARY
Glucose-Capillary: 54 mg/dL — ABNORMAL LOW (ref 70–99)
Glucose-Capillary: 77 mg/dL (ref 70–99)
Glucose-Capillary: 49 mg/dL — ABNORMAL LOW (ref 70–99)
Glucose-Capillary: 48 mg/dL — ABNORMAL LOW (ref 70–99)
Glucose-Capillary: 49 mg/dL — ABNORMAL LOW (ref 70–99)
Glucose-Capillary: 81 mg/dL (ref 70–99)
Glucose-Capillary: 56 mg/dL — ABNORMAL LOW (ref 70–99)
Glucose-Capillary: 56 mg/dL — ABNORMAL LOW (ref 70–99)
Glucose-Capillary: 42 mg/dL — CL (ref 70–99)
Glucose-Capillary: 33 mg/dL — CL (ref 70–99)
Glucose-Capillary: 95 mg/dL (ref 70–99)
Glucose-Capillary: 76 mg/dL (ref 70–99)
Glucose-Capillary: 33 mg/dL — CL (ref 70–99)
Glucose-Capillary: 41 mg/dL — CL (ref 70–99)
Glucose-Capillary: 41 mg/dL — CL (ref 70–99)
Glucose-Capillary: 41 mg/dL — CL (ref 70–99)
Glucose-Capillary: 48 mg/dL — ABNORMAL LOW (ref 70–99)
Glucose-Capillary: 108 mg/dL — ABNORMAL HIGH (ref 70–99)

## 2018-08-14 LAB — GLUCOSE, RANDOM: Glucose, Bld: 56 mg/dL — ABNORMAL LOW (ref 70–99)

## 2018-08-14 MED ORDER — HEPARIN SODIUM (PORCINE) 1000 UNIT/ML DIALYSIS: 20.0000 [IU]/kg | INTRAMUSCULAR | Status: DC | PRN

## 2018-08-14 MED ORDER — DEXTROSE 50 % IV SOLN
50.0000 mL | Freq: Once | INTRAVENOUS | Status: AC
  Administered 2018-08-14: 50 mL via INTRAVENOUS

## 2018-08-14 MED ORDER — DEXTROSE 50 % IV SOLN
INTRAVENOUS | Status: AC
  Administered 2018-08-14: 50 mL via INTRAVENOUS
  Filled 2018-08-14: qty 50

## 2018-08-14 MED ORDER — PENTAFLUOROPROP-TETRAFLUOROETH EX AERO: 1.0000 "application " | INHALATION_SPRAY | CUTANEOUS | Status: DC | PRN

## 2018-08-14 MED ORDER — OXYCODONE HCL 5 MG PO TABS
5.0000 mg | ORAL_TABLET | Freq: Four times a day (QID) | ORAL | Status: DC | PRN
  Administered 2018-08-14 – 2018-08-15 (×3): 5 mg via ORAL
  Filled 2018-08-14 (×3): qty 1

## 2018-08-14 MED ORDER — ALTEPLASE 2 MG IJ SOLR: 2.0000 mg | Freq: Once | INTRAMUSCULAR | Status: DC | PRN

## 2018-08-14 MED ORDER — SODIUM CHLORIDE 0.9 % IV SOLN: 100.0000 mL | INTRAVENOUS | Status: DC | PRN

## 2018-08-14 MED ORDER — GLUCOSE 40 % PO GEL
ORAL | Status: AC
  Administered 2018-08-14: 37.5 g via ORAL
  Filled 2018-08-14: qty 1

## 2018-08-14 MED ORDER — GLUCOSE 40 % PO GEL
1.0000 | Freq: Once | ORAL | Status: AC
  Administered 2018-08-14: 37.5 g via ORAL

## 2018-08-14 MED ORDER — DEXTROSE 50 % IV SOLN
25.0000 mL | Freq: Once | INTRAVENOUS | Status: AC
  Administered 2018-08-14: 25 mL via INTRAVENOUS

## 2018-08-14 MED ORDER — HEPARIN SODIUM (PORCINE) 1000 UNIT/ML DIALYSIS: 1000.0000 [IU] | INTRAMUSCULAR | Status: DC | PRN

## 2018-08-14 MED ORDER — LIDOCAINE-PRILOCAINE 2.5-2.5 % EX CREA: 1.0000 "application " | TOPICAL_CREAM | CUTANEOUS | Status: DC | PRN

## 2018-08-14 MED ORDER — LIDOCAINE HCL (PF) 1 % IJ SOLN: 5.0000 mL | INTRAMUSCULAR | Status: DC | PRN

## 2018-08-14 MED ORDER — GLUCOSE 40 % PO GEL
ORAL | Status: AC
  Administered 2018-08-14: 21:00:00
  Filled 2018-08-14: qty 1

## 2018-08-14 NOTE — Progress Notes (Signed)
[place] PHYSICAL MEDICINE & REHABILITATION PROGRESS NOTE  Subjective/Complaints:  Patient seen sitting up in his chair this AM.  He states he did not sleep well overnight, but cannot identify a reason.  Discussed dressing changes with nursing.   ROS: Denies CP, shortness of breath, nausea, vomiting, diarrhea.  Objective: Vital Signs: Blood pressure 137/76, pulse 90, temperature 98.7 F (37.1 C), temperature source Oral, resp. rate 16, height 6' (1.829 m), weight 97 kg, SpO2 92 %. No results found. Recent Labs    08/11/18 1330  WBC 9.9  HGB 9.1*  HCT 29.3*  PLT 226   Recent Labs    08/11/18 1330  NA 133*  K 4.6  CL 92*  CO2 25  GLUCOSE 100*  BUN 69*  CREATININE 11.06*  CALCIUM 9.3    Physical Exam: BP 137/76 (BP Location: Right Arm)   Pulse 90   Temp 98.7 F (37.1 C) (Oral)   Resp 16   Ht 6' (1.829 m)   Wt 97 kg   SpO2 92%   BMI 29.00 kg/m  Constitutional: No distress . Vital signs reviewed. HENT: Normocephalic. Atraumatic.  Eyes: EOMI. No discharge. Cardiovascular: No JVD. Respiratory: Normal effort.  GI: Non-distended.  Musculoskeletal: Right AKA with tenderness, improving Neurological: He is alert Motor:  Right lower extremity: Hip flexion: 4/5 (pain inhibition), unchanged Left lower extremity: 4/5 proximal to distal, unchaned Skin: Left lower extremity with necrotic changes.  Please see media tab.  RLE: with sutures C/D/I.  Psychiatric:Flat and disengaged  Assessment/Plan: 1. Functional deficits secondary to right AKA which require 3+ hours per day of interdisciplinary therapy in a comprehensive inpatient rehab setting.  Physiatrist is providing close team supervision and 24 hour management of active medical problems listed below.  Physiatrist and rehab team continue to assess barriers to discharge/monitor patient progress toward functional and medical goals  Care Tool:  Bathing    Body parts bathed by patient: Right arm, Left arm, Chest,  Abdomen, Face, Front perineal area, Buttocks, Right upper leg, Left upper leg   Body parts bathed by helper: Front perineal area, Buttocks, Left upper leg, Left lower leg Body parts n/a: Right lower leg   Bathing assist Assist Level: Set up assist     Upper Body Dressing/Undressing Upper body dressing   What is the patient wearing?: Pull over shirt    Upper body assist Assist Level: Minimal Assistance - Patient > 75%    Lower Body Dressing/Undressing Lower body dressing      What is the patient wearing?: Pants, Underwear/pull up     Lower body assist Assist for lower body dressing: Moderate Assistance - Patient 50 - 74%     Toileting Toileting    Toileting assist Assist for toileting: Maximal Assistance - Patient 25 - 49%     Transfers Chair/bed transfer  Transfers assist     Chair/bed transfer assist level: Moderate Assistance - Patient 50 - 74%     Locomotion Ambulation   Ambulation assist      Assist level: Moderate Assistance - Patient 50 - 74% Assistive device: Walker-rolling Max distance: 1 hop   Walk 10 feet activity   Assist  Walk 10 feet activity did not occur: Safety/medical concerns        Walk 50 feet activity   Assist Walk 50 feet with 2 turns activity did not occur: Safety/medical concerns         Walk 150 feet activity   Assist Walk 150 feet activity did not occur:  Safety/medical concerns         Walk 10 feet on uneven surface  activity   Assist Walk 10 feet on uneven surfaces activity did not occur: Safety/medical concerns         Wheelchair     Assist Will patient use wheelchair at discharge?: Yes Type of Wheelchair: Manual    Wheelchair assist level: Supervision/Verbal cueing Max wheelchair distance: 150    Wheelchair 50 feet with 2 turns activity    Assist        Assist Level: Supervision/Verbal cueing   Wheelchair 150 feet activity     Assist     Assist Level: Supervision/Verbal  cueing      Medical Problem List and Plan: 1.Functional and mobility deficitssecondary to 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 Antithrombotics: -DVT/anticoagulation:Pharmaceutical:Heparin -antiplatelet therapy: N/A 3. Pain Management:  Continue gabapentin   Continue oxycodone prn. Intermittent confusion likely related to medications may be able to reduce 4. Mood:LCSW to follow for evaluation and support. -antipsychotic agents: N/A 5. Neuropsych: This patientis not fullycapable of making decisions on hisown behalf. 6. Skin/Wound Care:  Discussed with Ortho and PA- Silvadene dressing.  Severe PVD with limited blood flow per vascular. 7. Fluids/Electrolytes/Nutrition:Strict I/O.  8. T2DM: Hemoglobin A1c 10.1 on 5/18.  Continue to monitor BS ac/hs and use SSI for elevated BS.   Glucotrol 2.5 started on 5/20, d/ced on 5/25  Hypoglycemia on 5/25 due to decrease PO intake, will need to monitor CBGs frequently and supplement glucose as necessary. Discussed with nursing.  Monitor with increased mobility 9. ESRD: HD MWF. Schedule atend of the day to allow participation with therapies during the day and to assistwith activity tolerance.   Recs per nephro 10. Diabetic gastroparesis: On Reglan tid ac. 11. Anemia of chronic disease: Continue Aranesp weekly for supplement.   Hemoglobin 9.1 on 5/22  Continue to monitor 12. Glaucoma: Managed with Timoptic, Alphagan and Xalatan. 13.  Labile blood pressure  Relatively controlled on 5/25 Blood pressure 137/76, pulse 90, temperature 98.7 F (37.1 C), temperature source Oral, resp. rate 16, height 6' (1.829 m), weight 97 kg, SpO2 92 %. 14.  Constipation immobility with opioids, increased senna   LOS: 7 days A FACE TO FACE EVALUATION WAS PERFORMED  Glendy Barsanti Lorie Phenix 08/14/2018, 12:36 PM

## 2018-08-14 NOTE — Significant Event (Signed)
Rapid Response Event Note  Overview: Persistent hypoglycemia  Initial Focused Assessment: Upon arrival, Howard Bell is arousable and answers questions. He falls back asleep without stimulation.  CBGs have been consistently in the 56s. Contacted Reesa Chew PA and discussed POC.   Interventions: -D50 -Encourage protein snacks  Plan of Care (if not transferred): -Recheck CBG in 30 mins -May initiate Dextrose infusion if hypoglycemia continues  Event Summary: Stayed in room Call ended [phone]  Madelynn Done

## 2018-08-14 NOTE — Progress Notes (Addendum)
[facility] Progress Note   Subjective: about to  eat breakfast. PT/OT report some twitchiness since last week - Pt said he did not have prior to admission.  Objective Vitals:   08/13/18 0550 08/13/18 1417 08/13/18 2049 08/14/18 0311  BP: (!) 125/99 128/68 (!) 118/51 137/76  Pulse: 84 84 86 90  Resp: 18 18 17 16   Temp: 98.1 F (36.7 C) 98.1 F (36.7 C) 97.7 F (36.5 C) 98.7 F (37.1 C)  TempSrc: Oral Oral Oral Oral  SpO2: 93% 90% 91% 92%  Weight:    97 kg  Height:       Physical Exam General: obese male NAD flat affect -on O2 - sats variable per nursing sitting up in [facility] Heart: RRR; 2/6 systolic murmur Lungs: bilateral crackles - poor expansion Abd: obese somewhat distended nontender Extremities: R AKA healing well - re bandaged by nursing; tr LLE edema but necrotic changes - sock/dressing on foot not examined Dialysis Access: LUE AVF + bruit   SW MWF  4h 80min   450/2x  95.5kg   2/2.25 bath  P2  Hep 9000   L AVF Parsabiv 10mg  TIW Mircera 150 mcg IV q 2 weeks (last 5/4)  Assessment/Plan: 1. 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 Sharol Given. Now on CIR. 2. ESRD: Continue HD per MWF schedule. Next HDtoday- labs pre HD 3. Hypertension/volume: BP ok will need EDW slightly lowered on DC.  net UF 3 L 5/20 net UF 2 L 5/22 > 98.5- doesn't make sense - above pre HD wt - will need to try to get accurate wt prior to d/c- crackles and lowish sats today - try for more aggressive UF as BP allows 4. Anemia: Hgb 10.1> 9.1  Continue Aranesp weekly.- had been on 150 mircera - 60 aranesp ordered - this is a lot less than previously - ^ to 150 next week - check Fe studies !7% sat ferritin 1306 - will give short course IV Fe  5. Metabolic bone disease: Ca/Phos ok.Continue Ca acetate + Renvela as binder. Parsabiv not available in hospital. Follow labs.  6. T2DM: Insulin per primary. 7. Twitchiness - could be reglan EPS vs gabapentin myoclonus  Alric Seton,  PA 08/14/18, 9:03 AM  Pt seen, examined and agree w assess/plan as above with additions as indicated.  [facility] 08/14/2018, 12:43 PM     Additional Objective Labs: Basic Metabolic Panel: Recent Labs  Lab 08/09/18 1400 08/11/18 1330  NA 130* 133*  K 5.0 4.6  CL 92* 92*  CO2 24 25  GLUCOSE 211* 100*  BUN 68* 69*  CREATININE 11.82* 11.06*  CALCIUM 8.6* 9.3  PHOS 5.4* 4.2   Liver Function Tests: Recent Labs  Lab 08/09/18 1400 08/11/18 1330  ALBUMIN 2.4* 2.4*   CBC: Recent Labs  Lab 08/09/18 1400 08/11/18 1330  WBC 9.2 9.9  HGB 9.1* 9.1*  HCT 29.0* 29.3*  MCV 93.5 93.9  PLT 218 226   CBG: Recent Labs  Lab 08/13/18 1145 08/13/18 1719 08/13/18 2051 08/14/18 0648 08/14/18 0715  GLUCAP 109* 73 78 54* 77   Medications: . ferric gluconate (FERRLECIT/NULECIT) IV     . brimonidine  1 drop Both Eyes BID  . calcium acetate  2,001 mg Oral TID WC  . Chlorhexidine Gluconate Cloth  6 each Topical Q0600  . Chlorhexidine Gluconate Cloth  6 each Topical Q0600  . darbepoetin (ARANESP) injection - DIALYSIS  150 mcg Intravenous Q  Mon-HD  . docusate sodium  100 mg Oral BID  . doxycycline  100 mg Oral Q12H  . feeding supplement (PRO-STAT SUGAR FREE 64)  30 mL Oral BID  . fluticasone  1 spray Each Nare Daily  . gabapentin  300 mg Oral QHS  . glipiZIDE  2.5 mg Oral QAC breakfast  . heparin  5,000 Units Subcutaneous Q8H  . insulin aspart  0-5 Units Subcutaneous QHS  . insulin aspart  0-9 Units Subcutaneous TID WC  . latanoprost  1 drop Both Eyes QHS  . loratadine  10 mg Oral QPM  . metoCLOPramide  5 mg Oral TID AC  . metoprolol succinate  50 mg Oral QHS  . pantoprazole  40 mg Oral Daily  . senna-docusate  3 tablet Oral BID  . sevelamer carbonate  800 mg Oral TID WC  . timolol  1 drop Both Eyes BID

## 2018-08-14 NOTE — Progress Notes (Addendum)
Hypoglycemic Event:   CBG: 49 at 1218  Treatment: 8 oz juice/soda and 1 tube glucose gel + peanut butter/graham crackers  Symptoms: fatigue  Follow-up CBG: Time:1248    CBG Result:49   Treatment: 1 tube of glucose gel + ate lunch   Follow-up CBG: Time: 5929 CBG Result: 81  Possible Reasons for Event: Unknown  Comments/MD notified:Pam Love, PA-C

## 2018-08-14 NOTE — Significant Event (Signed)
Hypoglycemic Event  CBG: 40  Treatment: 8 oz juice/soda and 1 tube glucose gel   Symptoms: Shaky and fatigued  Follow-up CBG: Time:2125 CBG Result:41  Possible Reasons for Event: Medication regimen:   Comments/MD notified: Reesa Chew, PA was notified of pts condition and insist pt needs hemodialysis to filter built up glycemic medications.  Order to administer D50 ampule and recheck CBG in 30 Minutes.     Howard Bell

## 2018-08-14 NOTE — Progress Notes (Addendum)
Hypoglycemic Event  CBG: 56 at 1700  Treatment: 25 mL D50 IV + Nepro 237 mL  Symptoms: fatigue  Follow-up CBG: Time: 1805 CBG Result:42  Treatment: Ate dinner   Follow-up CBG: Time:  1835 CBG Result: 33  P. Love, PA-C notified again. Verbal order placed for 1 ampule of D50. PA emphasized importance and need for patient to be dialysised d/t lingering antidiabetic medications. Chief Strategy Officer of this note notified HD RN of this who will speak to Nephrologist.   Treatment: 50 mL D50   Possible Reasons for Event: Unknown  Comments/MD notified:Pam Love, PA-C   [place]

## 2018-08-14 NOTE — Progress Notes (Signed)
Occupational Therapy Session Note  Patient Details  Name: Howard Bell MRN: [id number] Date of Birth: 10/29/1965  Today's Date: 08/14/2018 OT Individual Time: 0830-0920 OT Individual Time Calculation (min): 50 min  and Today's Date: 08/14/2018 OT Missed Time: 10 Minutes Missed Time Reason: (RN care)   Short Term Goals: Week 1:  OT Short Term Goal 1 (Week 1): Patient will complete LB bathing and dressing with moderate assistance with AE if needed.  OT Short Term Goal 2 (Week 1): Patient will complete toileting and toilet transfers with Min assist and appropriate DME as needed.  OT Short Term Goal 3 (Week 1): Patient will complete all UB bathing and dressing with Supervision while seated on EOB or at sink.   Skilled Therapeutic Interventions/Progress Updates:    Treatment session with focus on functional transfers and sequencing during mobility.  Pt received supine in bed with RN finishing changing residual limb dressing.  RN alerted therapist that blood glucose had been low this AM and that he should eat when breakfast tray arrives. Therapist wrapped residual limb in figure 8 pattern while educating pt on proper technique and fit.  Pt declined bathing/dressing at this time but agreeable to getting OOB for breakfast.  Max cues and education for weight shifting and proper placement of slide board prior to transfer.  Mod assist slide board transfer to Rt with multimodal cues for sequencing and weight shift.  MD arrived and assessed residual limb, therefore therapist rewrapped limb again during session.  Pt not wearing O2 during session and reports that he does not always wear it at home.  O2 sats assessed 83% on room air, therefore reapplied 2L O2 via nasal cannula with improvements, but continuing to fluctuate from mid 80s to low 90s.  Educated on deep breathing for improved sats.  Breakfast arrived.  Pt spilled coffee due to shakiness in BUE.  Neprology PA arrived - informed her of twitching in  BUE.  Pt left upright in w/c to engage in eating breakfast and allow PA to assess for HD.  Seat belt alarm on and all needs in reach.  Therapy Documentation Precautions:  Precautions Precautions: Fall Precaution Comments: wound VAC surgical incision. Right AKA. 2L oxygen Restrictions Weight Bearing Restrictions: Yes RLE Weight Bearing: Non weight bearing General: General OT Amount of Missed Time: 10 Minutes Pain: Pain Assessment Pain Scale: 0-10 Pain Score: 0-No pain   Therapy/Group: Individual Therapy  Simonne Come 08/14/2018, 10:16 AM

## 2018-08-14 NOTE — Progress Notes (Signed)
Patient has been hypoglycemic today likely due to glucotrol (addeded 5/20) Glucotrol was discontinued today--will likely need to continue with SSI for diabetes management at discharge. Repeat 1/2 amp dextrose and offer nephro prior to going to HD.

## 2018-08-14 NOTE — Plan of Care (Signed)
  Problem: Consults Goal: RH LIMB LOSS PATIENT EDUCATION Description Description: See Patient Education module for eduction specifics. Outcome: Progressing Goal: Skin Care Protocol Initiated - if Braden Score 18 or less Description If consults are not indicated, leave blank or document N/A Outcome: Progressing Goal: Diabetes Guidelines if Diabetic/Glucose > 140 Description If diabetic or lab glucose is > 140 mg/dl - Initiate Diabetes/Hyperglycemia Guidelines & Document Interventions  Outcome: Progressing Goal: Skin Care Protocol Initiated - if Braden Score 18 or less Description If consults are not indicated, leave blank or document N/A Outcome: Progressing   Problem: RH BOWEL ELIMINATION Goal: RH STG MANAGE BOWEL WITH ASSISTANCE Description STG Manage Bowel with min Assistance.  Outcome: Progressing Flowsheets (Taken 08/14/2018 1532) STG: Pt will manage bowels with assistance: 3-Moderate assistance Goal: RH STG MANAGE BOWEL W/MEDICATION W/ASSISTANCE Description STG Manage Bowel with Medication with min Assistance.  Outcome: Progressing Flowsheets (Taken 08/14/2018 1532) STG: Pt will manage bowels with medication with assistance: 3-Moderate assistance   Problem: RH BLADDER ELIMINATION Goal: RH STG MANAGE BLADDER WITH ASSISTANCE Description STG Manage Bladder With min Assistance  Outcome: Progressing Flowsheets (Taken 08/14/2018 1532) STG: Pt will manage bladder with assistance: 3-Moderate assistance Goal: RH STG MANAGE BLADDER WITH MEDICATION WITH ASSISTANCE Description STG Manage Bladder With Medication With min Assistance.  Outcome: Progressing Flowsheets (Taken 08/14/2018 1532) STG: Pt will manage bladder with medication with assistance: 3-Moderate assistance   Problem: RH SKIN INTEGRITY Goal: RH STG SKIN FREE OF INFECTION/BREAKDOWN Description Skin to remain free from breakdown while on rehab with min assist.  Outcome: Progressing Goal: RH STG MAINTAIN SKIN  INTEGRITY WITH ASSISTANCE Description STG Maintain Skin Integrity With min Assistance.  Outcome: Progressing Flowsheets (Taken 08/14/2018 1532) STG: Maintain skin integrity with assistance: 3-Moderate assistance Goal: RH STG ABLE TO PERFORM INCISION/WOUND CARE W/ASSISTANCE Description STG Able To Perform Incision/Wound Care With min Assistance.  Outcome: Progressing Flowsheets (Taken 08/14/2018 1532) STG: Pt will be able to perform incision/wound care with assistance: 3-Moderate assistance   Problem: RH SAFETY Goal: RH STG ADHERE TO SAFETY PRECAUTIONS W/ASSISTANCE/DEVICE Description STG Adhere to Safety Precautions With min Assistance and appropriate assistive Device.  Outcome: Progressing Flowsheets (Taken 08/14/2018 1532) STG:Pt will adhere to safety precautions with assistance/device: 3-Moderate assistance Goal: RH STG DECREASED RISK OF FALL WITH ASSISTANCE Description STG Decreased Risk of Fall With min Assistance.  Outcome: Progressing Flowsheets (Taken 08/14/2018 1532) XTG:GYIRSWNIO risk of fall  with assistance/device: 3-Moderate assistance   Problem: RH PAIN MANAGEMENT Goal: RH STG PAIN MANAGED AT OR BELOW PT'S PAIN GOAL Description <3 on a 0-10 pain scale.  Outcome: Progressing   Problem: RH KNOWLEDGE DEFICIT LIMB LOSS Goal: RH STG INCREASE KNOWLEDGE OF SELF CARE AFTER LIMB LOSS Description Patient will be able to demonstrate knowledge of how to take care of residual limb, manage medications, and follow up care with the MD at discharge.  Outcome: Progressing

## 2018-08-14 NOTE — Progress Notes (Signed)
Occupational Therapy Session Note  Patient Details  Name: Howard Bell MRN: [id number] Date of Birth: 01-Aug-1965  Today's Date: 08/14/2018 OT Individual Time: 1045-1130 OT Individual Time Calculation (min): 45 min    Short Term Goals: Week 1:  OT Short Term Goal 1 (Week 1): Patient will complete LB bathing and dressing with moderate assistance with AE if needed.  OT Short Term Goal 2 (Week 1): Patient will complete toileting and toilet transfers with Min assist and appropriate DME as needed.  OT Short Term Goal 3 (Week 1): Patient will complete all UB bathing and dressing with Supervision while seated on EOB or at sink.   Skilled Therapeutic Interventions/Progress Updates:    Pt seated in W/C upon therapy arrival and agreed to completed UB bathing and dressing at the sink as he refused to wash up earlier this morning. Patient completed UB bathing and dressing with supervision and VC to remain on task. He brushed his teeth with set up seated as well. Patient declined completing LB bathing stating that he would do it later.  Completed seated BUE strengthening using 7# hand weight to focus on shoulder and elbow strengthening needed to increase functional performance during ADL tasks. Pt was provided with VC for form and technique and rest breaks were taken as needed. Pt left in wheelchair with seat belt alarm on and call light within reach.    Therapy Documentation Precautions:  Precautions Precautions: Fall Precaution Comments: wound VAC surgical incision. Right AKA. 2L oxygen Restrictions Weight Bearing Restrictions: Yes RLE Weight Bearing: Non weight bearing Pain: Pain Assessment Pain Scale: 0-10 Pain Score: 10-Worst pain ever Pain Type: Surgical pain Pain Location: Leg Pain Orientation: Right Pain Descriptors / Indicators: Aching;Constant Pain Onset: On-going Pain Intervention(s): Emotional support;Distraction   Therapy/Group: Individual Therapy  Ailene Ravel,  OTR/L,CBIS  [phone]  08/14/2018, 12:48 PM

## 2018-08-14 NOTE — Significant Event (Signed)
Hypoglycemic Event  CBG: 54  Treatment: 8 oz of ginger- ale Symptoms: None reported  Follow-up CBG: Time:0715 CBG Result:77  Possible Reasons for Event: Unknown  Comments/MD notified:NA    Howard Bell  Red[place] Pulling

## 2018-08-14 NOTE — Progress Notes (Signed)
Physical Therapy Session Note  Patient Details  Name: Howard Bell MRN: [id number] Date of Birth: 1965/11/09  Today's Date: 08/14/2018 PT Individual Time: 0930-1023 PT Individual Time Calculation (min): 53 min   Short Term Goals: Week 1:  PT Short Term Goal 1 (Week 1): Patient will perform squat and stand pivot transfers with min A. PT Short Term Goal 2 (Week 1): Patient will demonstrate dynamic sitting balance with S.  PT Short Term Goal 3 (Week 1): Patient will ambulate 74' with LRAD and mod A PT Short Term Goal 4 (Week 1): Patient will perform w/c mobility S x continuous 150'   Skilled Therapeutic Interventions/Progress Updates:    pt in w/c, agreeable to therapy. Pt performs w/c mobility 150' with supervision with 2 rest breaks due to UE fatigue.  Pt performs w/c mobility in home environment with obstacle negotiation and changing directions all with increased time and supervision.  Sliding board transfers to Lt with CGA, min A for set up. Sliding board transfers to Rt with min/mod A, cues for technique and safety.  Seated abdominal/core strengthening with trunk diagonals with cues for technique, frequent rests due to fatigue.  Seated balance with dowel ball tap with pt improving with repetition, able to improve endurance from 20 to 35 hits before requiring rest. Pt spO2 92% on 2LO2 throughout session with cues for pursed lip breathing. Pt left in w/c with alarm set, needs at hand.  Therapy Documentation Precautions:  Precautions Precautions: Fall Precaution Comments: wound VAC surgical incision. Right AKA. 2L oxygen Restrictions Weight Bearing Restrictions: Yes RLE Weight Bearing: Non weight bearing Pain: Pain Assessment Pain Scale: 0-10 Pain Score: 0-No pain    Therapy/Group: Individual Therapy  Jeanee Fabre 08/14/2018, 10:24 AM

## 2018-08-15 ENCOUNTER — Inpatient Hospital Stay (HOSPITAL_COMMUNITY): Payer: Medicare Other | Admitting: Occupational Therapy

## 2018-08-15 ENCOUNTER — Inpatient Hospital Stay (HOSPITAL_COMMUNITY): Payer: Medicare Other

## 2018-08-15 ENCOUNTER — Inpatient Hospital Stay (HOSPITAL_COMMUNITY)
Admission: AD | Admit: 2018-08-15 | Discharge: 2018-08-17 | DRG: 871 | Disposition: A | Discharge status: Rehabilitation Facility | Payer: Medicare Other | Source: Ambulatory Visit | Attending: Internal Medicine | Admitting: Internal Medicine

## 2018-08-15 ENCOUNTER — Inpatient Hospital Stay (HOSPITAL_COMMUNITY): Payer: Medicare Other | Admitting: Speech Pathology

## 2018-08-15 ENCOUNTER — Telehealth: Payer: Self-pay

## 2018-08-15 DIAGNOSIS — E162 Hypoglycemia, unspecified: Secondary | ICD-10-CM | POA: Diagnosis present

## 2018-08-15 DIAGNOSIS — E1139 Type 2 diabetes mellitus with other diabetic ophthalmic complication: Secondary | ICD-10-CM | POA: Diagnosis present

## 2018-08-15 DIAGNOSIS — I96 Gangrene, not elsewhere classified: Secondary | ICD-10-CM | POA: Diagnosis not present

## 2018-08-15 DIAGNOSIS — R509 Fever, unspecified: Secondary | ICD-10-CM | POA: Diagnosis not present

## 2018-08-15 DIAGNOSIS — E1143 Type 2 diabetes mellitus with diabetic autonomic (poly)neuropathy: Secondary | ICD-10-CM | POA: Diagnosis not present

## 2018-08-15 DIAGNOSIS — E119 Type 2 diabetes mellitus without complications: Secondary | ICD-10-CM | POA: Diagnosis not present

## 2018-08-15 DIAGNOSIS — E1152 Type 2 diabetes mellitus with diabetic peripheral angiopathy with gangrene: Secondary | ICD-10-CM | POA: Diagnosis present

## 2018-08-15 DIAGNOSIS — J302 Other seasonal allergic rhinitis: Secondary | ICD-10-CM | POA: Diagnosis present

## 2018-08-15 DIAGNOSIS — Z7951 Long term (current) use of inhaled steroids: Secondary | ICD-10-CM

## 2018-08-15 DIAGNOSIS — S78111A Complete traumatic amputation at level between right hip and knee, initial encounter: Secondary | ICD-10-CM | POA: Diagnosis not present

## 2018-08-15 DIAGNOSIS — K5903 Drug induced constipation: Secondary | ICD-10-CM | POA: Diagnosis not present

## 2018-08-15 DIAGNOSIS — K3184 Gastroparesis: Secondary | ICD-10-CM | POA: Diagnosis present

## 2018-08-15 DIAGNOSIS — Z89511 Acquired absence of right leg below knee: Secondary | ICD-10-CM

## 2018-08-15 DIAGNOSIS — I70202 Unspecified atherosclerosis of native arteries of extremities, left leg: Secondary | ICD-10-CM | POA: Diagnosis not present

## 2018-08-15 DIAGNOSIS — G894 Chronic pain syndrome: Secondary | ICD-10-CM | POA: Diagnosis present

## 2018-08-15 DIAGNOSIS — E11649 Type 2 diabetes mellitus with hypoglycemia without coma: Secondary | ICD-10-CM | POA: Diagnosis present

## 2018-08-15 DIAGNOSIS — Z794 Long term (current) use of insulin: Secondary | ICD-10-CM

## 2018-08-15 DIAGNOSIS — G4733 Obstructive sleep apnea (adult) (pediatric): Secondary | ICD-10-CM | POA: Diagnosis present

## 2018-08-15 DIAGNOSIS — Z89611 Acquired absence of right leg above knee: Secondary | ICD-10-CM

## 2018-08-15 DIAGNOSIS — Z79899 Other long term (current) drug therapy: Secondary | ICD-10-CM

## 2018-08-15 DIAGNOSIS — G9341 Metabolic encephalopathy: Secondary | ICD-10-CM | POA: Diagnosis present

## 2018-08-15 DIAGNOSIS — E43 Unspecified severe protein-calorie malnutrition: Secondary | ICD-10-CM | POA: Diagnosis present

## 2018-08-15 DIAGNOSIS — Z6827 Body mass index (BMI) 27.0-27.9, adult: Secondary | ICD-10-CM

## 2018-08-15 DIAGNOSIS — H42 Glaucoma in diseases classified elsewhere: Secondary | ICD-10-CM | POA: Diagnosis present

## 2018-08-15 DIAGNOSIS — S78112A Complete traumatic amputation at level between left hip and knee, initial encounter: Secondary | ICD-10-CM | POA: Diagnosis present

## 2018-08-15 DIAGNOSIS — D631 Anemia in chronic kidney disease: Secondary | ICD-10-CM | POA: Diagnosis present

## 2018-08-15 DIAGNOSIS — N186 End stage renal disease: Secondary | ICD-10-CM

## 2018-08-15 DIAGNOSIS — E8889 Other specified metabolic disorders: Secondary | ICD-10-CM | POA: Diagnosis present

## 2018-08-15 DIAGNOSIS — K219 Gastro-esophageal reflux disease without esophagitis: Secondary | ICD-10-CM | POA: Diagnosis present

## 2018-08-15 DIAGNOSIS — E1165 Type 2 diabetes mellitus with hyperglycemia: Secondary | ICD-10-CM | POA: Diagnosis present

## 2018-08-15 DIAGNOSIS — I132 Hypertensive heart and chronic kidney disease with heart failure and with stage 5 chronic kidney disease, or end stage renal disease: Secondary | ICD-10-CM | POA: Diagnosis present

## 2018-08-15 DIAGNOSIS — E1122 Type 2 diabetes mellitus with diabetic chronic kidney disease: Secondary | ICD-10-CM | POA: Diagnosis present

## 2018-08-15 DIAGNOSIS — N2581 Secondary hyperparathyroidism of renal origin: Secondary | ICD-10-CM | POA: Diagnosis not present

## 2018-08-15 DIAGNOSIS — Z992 Dependence on renal dialysis: Secondary | ICD-10-CM | POA: Diagnosis not present

## 2018-08-15 DIAGNOSIS — R0989 Other specified symptoms and signs involving the circulatory and respiratory systems: Secondary | ICD-10-CM | POA: Diagnosis not present

## 2018-08-15 DIAGNOSIS — D638 Anemia in other chronic diseases classified elsewhere: Secondary | ICD-10-CM | POA: Diagnosis not present

## 2018-08-15 DIAGNOSIS — M19072 Primary osteoarthritis, left ankle and foot: Secondary | ICD-10-CM | POA: Diagnosis not present

## 2018-08-15 DIAGNOSIS — R52 Pain, unspecified: Secondary | ICD-10-CM | POA: Diagnosis not present

## 2018-08-15 DIAGNOSIS — I120 Hypertensive chronic kidney disease with stage 5 chronic kidney disease or end stage renal disease: Secondary | ICD-10-CM | POA: Diagnosis not present

## 2018-08-15 DIAGNOSIS — R4182 Altered mental status, unspecified: Secondary | ICD-10-CM | POA: Diagnosis not present

## 2018-08-15 DIAGNOSIS — G8929 Other chronic pain: Secondary | ICD-10-CM | POA: Diagnosis present

## 2018-08-15 DIAGNOSIS — A419 Sepsis, unspecified organism: Secondary | ICD-10-CM

## 2018-08-15 DIAGNOSIS — E1151 Type 2 diabetes mellitus with diabetic peripheral angiopathy without gangrene: Secondary | ICD-10-CM | POA: Diagnosis not present

## 2018-08-15 DIAGNOSIS — E11621 Type 2 diabetes mellitus with foot ulcer: Secondary | ICD-10-CM | POA: Diagnosis not present

## 2018-08-15 DIAGNOSIS — I509 Heart failure, unspecified: Secondary | ICD-10-CM | POA: Diagnosis present

## 2018-08-15 DIAGNOSIS — J9611 Chronic respiratory failure with hypoxia: Secondary | ICD-10-CM | POA: Diagnosis present

## 2018-08-15 DIAGNOSIS — L97509 Non-pressure chronic ulcer of other part of unspecified foot with unspecified severity: Secondary | ICD-10-CM | POA: Diagnosis present

## 2018-08-15 DIAGNOSIS — I10 Essential (primary) hypertension: Secondary | ICD-10-CM | POA: Diagnosis not present

## 2018-08-15 DIAGNOSIS — Z9981 Dependence on supplemental oxygen: Secondary | ICD-10-CM | POA: Diagnosis not present

## 2018-08-15 DIAGNOSIS — J189 Pneumonia, unspecified organism: Secondary | ICD-10-CM | POA: Diagnosis not present

## 2018-08-15 LAB — RENAL FUNCTION PANEL
Albumin: 2.2 g/dL — ABNORMAL LOW (ref 3.5–5.0)
Anion gap: 16 — ABNORMAL HIGH (ref 5–15)
BUN: 103 mg/dL — ABNORMAL HIGH (ref 6–20)
CO2: 24 mmol/L (ref 22–32)
Calcium: 9.3 mg/dL (ref 8.9–10.3)
Chloride: 89 mmol/L — ABNORMAL LOW (ref 98–111)
Creatinine, Ser: 13.65 mg/dL — ABNORMAL HIGH (ref 0.61–1.24)
GFR calc Af Amer: 4 mL/min — ABNORMAL LOW (ref >60)
GFR calc non Af Amer: 4 mL/min — ABNORMAL LOW (ref >60)
Glucose, Bld: 136 mg/dL — ABNORMAL HIGH (ref 70–99)
Phosphorus: 3.8 mg/dL (ref 2.5–4.6)
Potassium: 4.5 mmol/L (ref 3.5–5.1)
Sodium: 129 mmol/L — ABNORMAL LOW (ref 135–145)

## 2018-08-15 LAB — COMPREHENSIVE METABOLIC PANEL
ALT: 8 U/L (ref 0–44)
AST: 18 U/L (ref 15–41)
Albumin: 2.3 g/dL — ABNORMAL LOW (ref 3.5–5.0)
Alkaline Phosphatase: 118 U/L (ref 38–126)
Anion gap: 12 (ref 5–15)
BUN: 50 mg/dL — ABNORMAL HIGH (ref 6–20)
CO2: 26 mmol/L (ref 22–32)
Calcium: 8.5 mg/dL — ABNORMAL LOW (ref 8.9–10.3)
Chloride: 93 mmol/L — ABNORMAL LOW (ref 98–111)
Creatinine, Ser: 8.4 mg/dL — ABNORMAL HIGH (ref 0.61–1.24)
GFR calc Af Amer: 8 mL/min — ABNORMAL LOW (ref >60)
GFR calc non Af Amer: 7 mL/min — ABNORMAL LOW (ref >60)
Glucose, Bld: 163 mg/dL — ABNORMAL HIGH (ref 70–99)
Potassium: 4.3 mmol/L (ref 3.5–5.1)
Sodium: 131 mmol/L — ABNORMAL LOW (ref 135–145)
Total Bilirubin: 0.4 mg/dL (ref 0.3–1.2)
Total Protein: 7.5 g/dL (ref 6.5–8.1)

## 2018-08-15 LAB — CBC WITH DIFFERENTIAL/PLATELET
Abs Immature Granulocytes: 0.05 10*3/uL (ref 0.00–0.07)
Basophils Absolute: 0.1 10*3/uL (ref 0.0–0.1)
Basophils Relative: 1 %
Eosinophils Absolute: 0.2 10*3/uL (ref 0.0–0.5)
Eosinophils Relative: 2 %
HCT: 27.2 % — ABNORMAL LOW (ref 39.0–52.0)
Hemoglobin: 8.5 g/dL — ABNORMAL LOW (ref 13.0–17.0)
Immature Granulocytes: 1 %
Lymphocytes Relative: 14 %
Lymphs Abs: 1.4 10*3/uL (ref 0.7–4.0)
MCH: 28.4 pg (ref 26.0–34.0)
MCHC: 31.3 g/dL (ref 30.0–36.0)
MCV: 91 fL (ref 80.0–100.0)
Monocytes Absolute: 1.4 10*3/uL — ABNORMAL HIGH (ref 0.1–1.0)
Monocytes Relative: 14 %
Neutro Abs: 7 10*3/uL (ref 1.7–7.7)
Neutrophils Relative %: 68 %
Platelets: 194 10*3/uL (ref 150–400)
RBC: 2.99 MIL/uL — ABNORMAL LOW (ref 4.22–5.81)
RDW: 15.8 % — ABNORMAL HIGH (ref 11.5–15.5)
WBC: 9.9 10*3/uL (ref 4.0–10.5)
nRBC: 0 % (ref 0.0–0.2)

## 2018-08-15 LAB — CBC
HCT: 26.5 % — ABNORMAL LOW (ref 39.0–52.0)
Hemoglobin: 8.4 g/dL — ABNORMAL LOW (ref 13.0–17.0)
MCH: 28.9 pg (ref 26.0–34.0)
MCHC: 31.7 g/dL (ref 30.0–36.0)
MCV: 91.1 fL (ref 80.0–100.0)
Platelets: 214 10*3/uL (ref 150–400)
RBC: 2.91 MIL/uL — ABNORMAL LOW (ref 4.22–5.81)
RDW: 15.8 % — ABNORMAL HIGH (ref 11.5–15.5)
WBC: 8.4 10*3/uL (ref 4.0–10.5)
nRBC: 0 % (ref 0.0–0.2)

## 2018-08-15 LAB — PROTIME-INR
INR: 1.2 (ref 0.8–1.2)
Prothrombin Time: 14.8 seconds (ref 11.4–15.2)

## 2018-08-15 LAB — GLUCOSE, CAPILLARY
Glucose-Capillary: 109 mg/dL — ABNORMAL HIGH (ref 70–99)
Glucose-Capillary: 128 mg/dL — ABNORMAL HIGH (ref 70–99)
Glucose-Capillary: 131 mg/dL — ABNORMAL HIGH (ref 70–99)
Glucose-Capillary: 143 mg/dL — ABNORMAL HIGH (ref 70–99)
Glucose-Capillary: 215 mg/dL — ABNORMAL HIGH (ref 70–99)
Glucose-Capillary: 40 mg/dL — CL (ref 70–99)
Glucose-Capillary: 59 mg/dL — ABNORMAL LOW (ref 70–99)
Glucose-Capillary: 70 mg/dL (ref 70–99)
Glucose-Capillary: 82 mg/dL (ref 70–99)
Glucose-Capillary: 82 mg/dL (ref 70–99)

## 2018-08-15 LAB — PROCALCITONIN: Procalcitonin: 3.02 ng/mL

## 2018-08-15 LAB — PREALBUMIN: Prealbumin: 14.6 mg/dL — ABNORMAL LOW (ref 18–38)

## 2018-08-15 LAB — LACTIC ACID, PLASMA
Lactic Acid, Venous: 1.5 mmol/L (ref 0.5–1.9)
Lactic Acid, Venous: 1 mmol/L (ref 0.5–1.9)

## 2018-08-15 LAB — VITAMIN B12: Vitamin B-12: 431 pg/mL (ref 180–914)

## 2018-08-15 LAB — T4, FREE: Free T4: 1.06 ng/dL (ref 0.82–1.77)

## 2018-08-15 LAB — TSH: TSH: 7.538 u[IU]/mL — ABNORMAL HIGH (ref 0.350–4.500)

## 2018-08-15 LAB — AMMONIA: Ammonia: 34 umol/L (ref 9–35)

## 2018-08-15 LAB — HEMOGLOBIN A1C
Hgb A1c MFr Bld: 7.8 % — ABNORMAL HIGH (ref 4.8–5.6)
Mean Plasma Glucose: 177.16 mg/dL

## 2018-08-15 LAB — FIBRINOGEN: Fibrinogen: >800 mg/dL — ABNORMAL HIGH (ref 210–475)

## 2018-08-15 LAB — APTT: aPTT: 45 seconds — ABNORMAL HIGH (ref 24–36)

## 2018-08-15 LAB — C-REACTIVE PROTEIN: CRP: 20.5 mg/dL — ABNORMAL HIGH (ref <1.0)

## 2018-08-15 LAB — FERRITIN: Ferritin: 1085 ng/mL — ABNORMAL HIGH (ref 24–336)

## 2018-08-15 LAB — SEDIMENTATION RATE: Sed Rate: >140 mm/hr — ABNORMAL HIGH (ref 0–16)

## 2018-08-15 MED ORDER — SILVER SULFADIAZINE 1 % EX CREA
TOPICAL_CREAM | Freq: Every day | CUTANEOUS | Status: DC
  Administered 2018-08-15: 13:00:00 via TOPICAL
  Filled 2018-08-15: qty 85

## 2018-08-15 MED ORDER — VANCOMYCIN HCL IN DEXTROSE 1-5 GM/200ML-% IV SOLN
1000.0000 mg | INTRAVENOUS | Status: DC
  Administered 2018-08-16: 15:00:00 1000 mg via INTRAVENOUS
  Filled 2018-08-15: qty 200

## 2018-08-15 MED ORDER — ONDANSETRON HCL 4 MG/2ML IJ SOLN: 4.0000 mg | Freq: Four times a day (QID) | INTRAMUSCULAR | Status: DC | PRN

## 2018-08-15 MED ORDER — VANCOMYCIN HCL 10 G IV SOLR
2000.0000 mg | Freq: Once | INTRAVENOUS | Status: DC
  Administered 2018-08-15: 2000 mg via INTRAVENOUS
  Filled 2018-08-15: qty 2000

## 2018-08-15 MED ORDER — PANTOPRAZOLE SODIUM 40 MG PO TBEC
40.0000 mg | DELAYED_RELEASE_TABLET | Freq: Every day | ORAL | Status: DC
  Administered 2018-08-16 – 2018-08-17 (×3): 40 mg via ORAL
  Filled 2018-08-15 (×3): qty 1

## 2018-08-15 MED ORDER — DEXTROSE 50 % IV SOLN: 1.0000 | Freq: Once | INTRAVENOUS | Status: DC

## 2018-08-15 MED ORDER — ACETAMINOPHEN 650 MG RE SUPP: 650.0000 mg | Freq: Four times a day (QID) | RECTAL | Status: DC | PRN

## 2018-08-15 MED ORDER — METOCLOPRAMIDE HCL 5 MG PO TABS
5.0000 mg | ORAL_TABLET | Freq: Three times a day (TID) | ORAL | Status: DC
  Administered 2018-08-16 – 2018-08-17 (×5): 5 mg via ORAL
  Filled 2018-08-15 (×5): qty 1

## 2018-08-15 MED ORDER — BISACODYL 10 MG RE SUPP: 10.0000 mg | Freq: Every day | RECTAL | Status: DC | PRN

## 2018-08-15 MED ORDER — ALBUTEROL SULFATE (2.5 MG/3ML) 0.083% IN NEBU: 3.0000 mL | INHALATION_SOLUTION | RESPIRATORY_TRACT | Status: DC | PRN

## 2018-08-15 MED ORDER — SODIUM CHLORIDE 0.9% FLUSH
3.0000 mL | Freq: Two times a day (BID) | INTRAVENOUS | Status: DC
  Administered 2018-08-16 – 2018-08-17 (×4): 3 mL via INTRAVENOUS

## 2018-08-15 MED ORDER — SODIUM CHLORIDE 0.9 % IV SOLN
2.0000 g | Freq: Once | INTRAVENOUS | Status: AC
  Administered 2018-08-15: 2 g via INTRAVENOUS
  Filled 2018-08-15: qty 2

## 2018-08-15 MED ORDER — SEVELAMER CARBONATE 800 MG PO TABS
800.0000 mg | ORAL_TABLET | Freq: Three times a day (TID) | ORAL | Status: DC
  Administered 2018-08-16 – 2018-08-17 (×4): 800 mg via ORAL
  Filled 2018-08-15 (×4): qty 1

## 2018-08-15 MED ORDER — HEPARIN SODIUM (PORCINE) 5000 UNIT/ML IJ SOLN
5000.0000 [IU] | Freq: Three times a day (TID) | INTRAMUSCULAR | Status: DC
  Administered 2018-08-16 – 2018-08-17 (×3): 5000 [IU] via SUBCUTANEOUS
  Filled 2018-08-15 (×3): qty 1

## 2018-08-15 MED ORDER — LATANOPROST 0.005 % OP SOLN
1.0000 [drp] | Freq: Every day | OPHTHALMIC | Status: DC
  Administered 2018-08-16 (×2): 1 [drp] via OPHTHALMIC
  Filled 2018-08-15: qty 2.5

## 2018-08-15 MED ORDER — ONDANSETRON HCL 4 MG PO TABS: 4.0000 mg | ORAL_TABLET | Freq: Four times a day (QID) | ORAL | Status: DC | PRN

## 2018-08-15 MED ORDER — BRIMONIDINE TARTRATE 0.2 % OP SOLN
1.0000 [drp] | Freq: Two times a day (BID) | OPHTHALMIC | Status: DC
  Administered 2018-08-16 – 2018-08-17 (×4): 1 [drp] via OPHTHALMIC
  Filled 2018-08-15: qty 5

## 2018-08-15 MED ORDER — METRONIDAZOLE IN NACL 5-0.79 MG/ML-% IV SOLN
500.0000 mg | Freq: Three times a day (TID) | INTRAVENOUS | Status: DC
  Administered 2018-08-16: 500 mg via INTRAVENOUS
  Filled 2018-08-15: qty 100

## 2018-08-15 MED ORDER — SODIUM CHLORIDE 0.9 % IV SOLN: 2.0000 g | INTRAVENOUS | Status: DC

## 2018-08-15 MED ORDER — DEXTROSE 50 % IV SOLN
INTRAVENOUS | Status: AC
  Administered 2018-08-15: 01:00:00 50 mL
  Filled 2018-08-15: qty 50

## 2018-08-15 MED ORDER — OXYCODONE HCL 5 MG PO TABS
ORAL_TABLET | ORAL | Status: AC
  Filled 2018-08-15: qty 1

## 2018-08-15 MED ORDER — INSULIN ASPART 100 UNIT/ML ~~LOC~~ SOLN
0.0000 [IU] | SUBCUTANEOUS | Status: DC
  Administered 2018-08-16 – 2018-08-17 (×2): 1 [IU] via SUBCUTANEOUS
  Administered 2018-08-17 (×2): 3 [IU] via SUBCUTANEOUS

## 2018-08-15 MED ORDER — CALCIUM ACETATE (PHOS BINDER) 667 MG PO CAPS
2001.0000 mg | ORAL_CAPSULE | Freq: Three times a day (TID) | ORAL | Status: DC
  Administered 2018-08-16 – 2018-08-17 (×5): 2001 mg via ORAL
  Filled 2018-08-15 (×5): qty 3

## 2018-08-15 MED ORDER — DARBEPOETIN ALFA 150 MCG/0.3ML IJ SOSY
PREFILLED_SYRINGE | INTRAMUSCULAR | Status: AC
  Filled 2018-08-15: qty 0.3

## 2018-08-15 MED ORDER — DOCUSATE SODIUM 100 MG PO CAPS
100.0000 mg | ORAL_CAPSULE | Freq: Two times a day (BID) | ORAL | Status: DC
  Administered 2018-08-16 – 2018-08-17 (×4): 100 mg via ORAL
  Filled 2018-08-15 (×4): qty 1

## 2018-08-15 MED ORDER — ACETAMINOPHEN 325 MG PO TABS
650.0000 mg | ORAL_TABLET | Freq: Four times a day (QID) | ORAL | Status: DC | PRN
  Administered 2018-08-16: 650 mg via ORAL
  Filled 2018-08-15: qty 2

## 2018-08-15 MED ORDER — FLUTICASONE PROPIONATE 50 MCG/ACT NA SUSP
1.0000 | Freq: Every day | NASAL | Status: DC
  Administered 2018-08-16 – 2018-08-17 (×2): 1 via NASAL
  Filled 2018-08-15: qty 16

## 2018-08-15 MED ORDER — SODIUM CHLORIDE 0.9 % IV SOLN
2.0000 g | INTRAVENOUS | Status: DC
  Administered 2018-08-16: 2 g via INTRAVENOUS
  Filled 2018-08-15: qty 2

## 2018-08-15 MED ORDER — GLUCOSE 4 G PO CHEW
CHEWABLE_TABLET | ORAL | Status: AC
  Filled 2018-08-15: qty 1

## 2018-08-15 MED ORDER — METOPROLOL SUCCINATE ER 50 MG PO TB24
50.0000 mg | ORAL_TABLET | Freq: Every day | ORAL | Status: DC
  Administered 2018-08-16 (×2): 50 mg via ORAL
  Filled 2018-08-15 (×2): qty 1

## 2018-08-15 MED ORDER — TIMOLOL MALEATE 0.5 % OP SOLN
1.0000 [drp] | Freq: Two times a day (BID) | OPHTHALMIC | Status: DC
  Administered 2018-08-16 – 2018-08-17 (×4): 1 [drp] via OPHTHALMIC
  Filled 2018-08-15: qty 5

## 2018-08-15 MED ORDER — VANCOMYCIN HCL IN DEXTROSE 1-5 GM/200ML-% IV SOLN: 1000.0000 mg | INTRAVENOUS | Status: DC

## 2018-08-15 MED ORDER — POLYETHYLENE GLYCOL 3350 17 G PO PACK
17.0000 g | PACK | Freq: Every day | ORAL | Status: DC | PRN
  Administered 2018-08-16: 17 g via ORAL
  Filled 2018-08-15: qty 1

## 2018-08-15 NOTE — Progress Notes (Signed)
Occupational Therapy Weekly Progress Note  Patient Details  Name: Howard Bell MRN: [id number] Date of Birth: Aug 28, 1965  Beginning of progress report period: Aug 08, 2018 End of progress report period: Aug 15, 2018  Patient has met 2 of 3 short term goals.  Pt is making slow progress towards goals due to fluctuations in arousal, cognition, and recent episodes of hypoglycemia.  Pt currently demonstrates ability to transfer to Lt with min assist and mod assist when transferring to Rt.  Pt has demonstrated ability to complete LB bathing and dressing at bed level with mod assist and UB bathing and dressing seated in w/c at Supervision level.  Pt's wife reports that he tends to benefit from set schedule and has been known to develop increased confusion and hypoglycemic events when hospitalized in past.  Plan to implement a more established schedule to increase participation.  Patient continues to demonstrate the following deficits: muscle weakness, decreased oxygen support, decreased awareness, decreased problem solving and decreased safety awareness and decreased sitting balance, decreased standing balance and decreased postural control and therefore will continue to benefit from skilled OT intervention to enhance overall performance with BADL and Reduce care partner burden.  Patient progressing toward long term goals..  Continue plan of care.  OT Short Term Goals Week 1:  OT Short Term Goal 1 (Week 1): Patient will complete LB bathing and dressing with moderate assistance with AE if needed.  OT Short Term Goal 1 - Progress (Week 1): Met OT Short Term Goal 2 (Week 1): Patient will complete toileting and toilet transfers with Min assist and appropriate DME as needed.  OT Short Term Goal 2 - Progress (Week 1): Progressing toward goal OT Short Term Goal 3 (Week 1): Patient will complete all UB bathing and dressing with Supervision while seated on EOB or at sink.  OT Short Term Goal 3 - Progress  (Week 1): Met Week 2:  OT Short Term Goal 1 (Week 2): Patient will complete toileting with Min assist  OT Short Term Goal 2 (Week 2): Patient will complete toilet transfers with Min assist and appropriate DME as needed    Therapy/Group: Individual Therapy  Simonne Come 08/15/2018, 2:35 PM

## 2018-08-15 NOTE — Telephone Encounter (Signed)
[facility] called asking if Dr. Sharol Given can come by and see recent surgery patient They are concerned he may be becoming septic he's in [id number]

## 2018-08-15 NOTE — Progress Notes (Signed)
Patient had unstable CBG values on 5/25 and was given D50. Pt's blood sugars stabilized and patient was taken to dialysis in the early morning hours. Pt returned to unit early in the morning. Pt has been sleepy and lethargic throughout the day. Pt has had some increased confusion. Pt has wound to left foot and drainage was noted with a foul odor. Dressing changed. Reesa Chew PA consulted to look at wound. Temperature is 101.3. Pt given Tylenol. Labs ordered and acute MD consulted. Dr. Posey Pronto assessed patient at bedside and decision was made to transfer patient to acute for management of sepsis.

## 2018-08-15 NOTE — Progress Notes (Signed)
Patient transferred to 5MW. Patient's wife updated.

## 2018-08-15 NOTE — Progress Notes (Signed)
Pharmacy Antibiotic Note  Howard Bell is a 53 y.o. 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 Pt has been on doxycycline since 5/19 but has now been spiking fevers. Pharmacy has been consulted for vancomycin and cefepime dosing. Of note, pt has ESRD on HD MWF, last HD late on 5/25, planning for HD again tomorrow per nephrology.  Plan: -Cefepime 2g IV x1 tonight then qHD MWF -Vancomycin 2000mg  IV x1 then 1000mg  qHD MWF -Monitor HD schedule, LOT, cultures   Height: 6' (182.9 cm) Weight: 216 lb 4.3 oz (98.1 kg) IBW/kg (Calculated) : 77.6  Temp (24hrs), Avg:98.5 F (36.9 C), Min:97.5 F (36.4 C), Max:101.1 F (38.4 C)  Recent Labs  Lab 08/09/18 1400 08/11/18 1330 08/14/18 2356  WBC 9.2 9.9 8.4  CREATININE 11.82* 11.06* 13.65*    Estimated Creatinine Clearance: 7.8 mL/min (A) (by C-G formula based on SCr of 13.65 mg/dL (H)).    No Known Allergies  Antimicrobials this admission: 5/19 Doxycycline >> Vancomycin 5/26 >> Cefepime 5/26 >>   Microbiology results: 5/15 MRSA PCR: negative  Thank you for allowing pharmacy to be a part of this patient's care.   Arrie Senate, PharmD, BCPS Clinical Pharmacist [phone] Please check AMION for all [place] numbers 08/15/2018

## 2018-08-15 NOTE — Progress Notes (Signed)
[place] PHYSICAL MEDICINE & REHABILITATION PROGRESS NOTE  Subjective/Complaints:  Patient seen sitting up in bed this morning.  He states he slept well overnight.  Discussed with nursing hypoglycemia yesterday and overnight.  Discussed with PA as well -significant discussion with nursing overnight as well.  ROS: Denies CP, shortness of breath, nausea, vomiting, diarrhea.  Objective: Vital Signs: Blood pressure 137/74, pulse (!) 110, temperature 98 F (36.7 C), resp. rate 17, height 6' (1.829 m), weight 98.1 kg, SpO2 91 %. No results found. Recent Labs    08/14/18 2356  WBC 8.4  HGB 8.4*  HCT 26.5*  PLT 214   Recent Labs    08/14/18 2149 08/14/18 2356  NA  --  129*  K  --  4.5  CL  --  89*  CO2  --  24  GLUCOSE 56* 136*  BUN  --  103*  CREATININE  --  13.65*  CALCIUM  --  9.3    Physical Exam: BP 137/74 (BP Location: Right Arm)   Pulse (!) 110   Temp 98 F (36.7 C)   Resp 17   Ht 6' (1.829 m)   Wt 98.1 kg   SpO2 91%   BMI 29.33 kg/m  Constitutional: No distress . Vital signs reviewed. HENT: Normocephalic.  Atraumatic.  Eyes: EOMI.  No discharge. Cardiovascular: No JVD. Respiratory: Normal effort.  + [place]. GI: Non-distended.  Musculoskeletal: Right AKA with tenderness, unchanged Neurological: He is alert Motor:  Right lower extremity: Hip flexion: 4/5 (pain inhibition), unchanged Left lower extremity: 4/5 proximal to distal, unchanged Skin: Left lower extremity with necrotic changes.  Please see media tab.  RLE: with sutures C/D/I.  Psychiatric:Flat and disengaged  Assessment/Plan: 1. Functional deficits secondary to right AKA which require 3+ hours per day of interdisciplinary therapy in a comprehensive inpatient rehab setting.  Physiatrist is providing close team supervision and 24 hour management of active medical problems listed below.  Physiatrist and rehab team continue to assess barriers to discharge/monitor patient progress toward functional  and medical goals  Care Tool:  Bathing    Body parts bathed by patient: Right arm, Left arm, Chest, Abdomen, Face   Body parts bathed by helper: Front perineal area, Buttocks, Left upper leg, Left lower leg Body parts n/a: Front perineal area, Buttocks, Right upper leg, Right lower leg, Left upper leg, Left lower leg   Bathing assist Assist Level: Set up assist     Upper Body Dressing/Undressing Upper body dressing   What is the patient wearing?: Pull over shirt    Upper body assist Assist Level: Set up assist    Lower Body Dressing/Undressing Lower body dressing      What is the patient wearing?: Pants, Underwear/pull up     Lower body assist Assist for lower body dressing: Moderate Assistance - Patient 50 - 74%     Toileting Toileting    Toileting assist Assist for toileting: Maximal Assistance - Patient 25 - 49%     Transfers Chair/bed transfer  Transfers assist     Chair/bed transfer assist level: Moderate Assistance - Patient 50 - 74%     Locomotion Ambulation   Ambulation assist      Assist level: Moderate Assistance - Patient 50 - 74% Assistive device: Walker-rolling Max distance: 1 hop   Walk 10 feet activity   Assist  Walk 10 feet activity did not occur: Safety/medical concerns        Walk 50 feet activity   Assist Walk 50  feet with 2 turns activity did not occur: Safety/medical concerns         Walk 150 feet activity   Assist Walk 150 feet activity did not occur: Safety/medical concerns         Walk 10 feet on uneven surface  activity   Assist Walk 10 feet on uneven surfaces activity did not occur: Safety/medical concerns         Wheelchair     Assist Will patient use wheelchair at discharge?: Yes Type of Wheelchair: Manual    Wheelchair assist level: Supervision/Verbal cueing Max wheelchair distance: 150    Wheelchair 50 feet with 2 turns activity    Assist        Assist Level:  Supervision/Verbal cueing   Wheelchair 150 feet activity     Assist     Assist Level: Supervision/Verbal cueing      Medical Problem List and Plan: 1.Functional and mobility deficitssecondary to 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 Antithrombotics: -DVT/anticoagulation:Pharmaceutical:Heparin -antiplatelet therapy: N/A 3. Pain Management:  Continue gabapentin   Continue oxycodone prn. Intermittent confusion likely related to medications may be able to reduce 4. Mood:LCSW to follow for evaluation and support. -antipsychotic agents: N/A 5. Neuropsych: This patientis not fullycapable of making decisions on hisown behalf. 6. Skin/Wound Care:  Discussed with Ortho and PA- Silvadene dressing.  Severe PVD with limited blood flow per vascular. 7. Fluids/Electrolytes/Nutrition:Strict I/O.  8. T2DM: Hemoglobin A1c 10.1 on 5/18.  Continue to monitor BS ac/hs and use SSI for elevated BS.   Glucotrol 2.5 started on 5/20, d/ced on 5/25  Persistent hypoglycemia on 5/26  Monitor with increased mobility 9. ESRD: HD MWF. Schedule atend of the day to allow participation with therapies during the day and to assistwith activity tolerance.   Recs per nephro 10. Diabetic gastroparesis: On Reglan tid ac. 11. Anemia of chronic disease: Continue Aranesp weekly for supplement.   Hemoglobin 8.4 on 5/25  Continue to monitor 12. Glaucoma: Managed with Timoptic, Alphagan and Xalatan. 13.  Labile blood pressure  Labile on 5/26 with tachycardia-likely related to CBGs  Will consider ECG tomorrow if persistent Blood pressure 137/74, pulse (!) 110, temperature 98 F (36.7 C), resp. rate 17, height 6' (1.829 m), weight 98.1 kg, SpO2 91 %. 14.  Constipation immobility with opioids, increased senna   LOS: 8 days A FACE TO FACE EVALUATION WAS PERFORMED  Sharmane Dame Lorie Phenix 08/15/2018, 9:52 AM

## 2018-08-15 NOTE — Progress Notes (Addendum)
Brief Note:  Please see PA note, discharge summary, as well as progress note from today.  Patient noted to be septic and transferred to acute floor.

## 2018-08-15 NOTE — Progress Notes (Signed)
Occupational Therapy Note  Patient Details  Name: Howard Bell MRN: [id number] Date of Birth: 13-Jul-1965  Returned to attempt to engage pt in therapy session due to missed minutes this AM, however pt asleep upon arrival.  Pt easily aroused but stated "I'm sleeping" and closed his eyes and turned away from therapist.  Engaged in discussion with SLP with plan to implement a more set schedule to increase participation in therapy sessions.  Of note, eventful evening with late HD and hypoglycemic events.  Simonne Come 08/15/2018, 2:57 PM

## 2018-08-15 NOTE — Progress Notes (Signed)
Occupational Therapy Session Note  Patient Details  Name: Howard Bell MRN: [id number] Date of Birth: 05-22-1965  Today's Date: 08/15/2018 OT Individual Time: 1950-9326 OT Individual Time Calculation (min): 16 min  and Today's Date: 08/15/2018 OT Missed Time: 54 Minutes Missed Time Reason: Patient fatigue;Patient unwilling/refused to participate without medical reason   Short Term Goals: Week 1:  OT Short Term Goal 1 (Week 1): Patient will complete LB bathing and dressing with moderate assistance with AE if needed.  OT Short Term Goal 2 (Week 1): Patient will complete toileting and toilet transfers with Min assist and appropriate DME as needed.  OT Short Term Goal 3 (Week 1): Patient will complete all UB bathing and dressing with Supervision while seated on EOB or at sink.   Skilled Therapeutic Interventions/Progress Updates:    Attempted to engage pt in treatment session however pt stating it was "too much" at this time due to arriving back from HD around 0600.  Spoke with RN about overnight events. Engaged in discussion with pt regarding nutrition intake as pt reporting decreased appetite and not wishing to order breakfast.  Discussed hypoglycemic events from yesterday and need to continue with intake for blood sugar control as well as to promote healing.  Pt again refusing therapy at this time.  Will attempt to return in PM.  Therapy Documentation Precautions:  Precautions Precautions: Fall Precaution Comments: wound VAC surgical incision. Right AKA. 2L oxygen Restrictions Weight Bearing Restrictions: Yes RLE Weight Bearing: Non weight bearing General: General OT Amount of Missed Time: 44 Minutes Vital Signs: Therapy Vitals Temp: 98 F (36.7 C) Temp Source: Oral Pulse Rate: (!) 110 Resp: 17 BP: 137/74 Patient Position (if appropriate): Lying Oxygen Therapy SpO2: 91 % O2 Device: Room Air Pain: Pain Assessment Pain Scale: 0-10 Pain Score: 8    Therapy/Group:  Individual Therapy  Simonne Come 08/15/2018, 8:56 AM

## 2018-08-15 NOTE — Significant Event (Signed)
Howard Bell was notified of patients CBG's since 1st ampule of Dextrose was administered as ordered at approximately 2300. CBG was 108 at 2323 and 70 at 0005, and continued to dropped into 59 at 0039. Provider notified and order 2nd ampule of D50 to be administered before pt leaves for hemodialysis. Which was administered at 0051. Rapid Response nurse was contacted as well to assess pt d/t concern of consistent hypoglycemia.  Patient is now in hemodialysis.

## 2018-08-15 NOTE — Progress Notes (Addendum)
Physical Therapy Session Note  Patient Details  Name: Howard Bell MRN: [id number] Date of Birth: May 19, 1965  Today's Date: 08/15/2018 PT Individual Time:  -      Short Term Goals: Week 1:  PT Short Term Goal 1 (Week 1): Patient will perform squat and stand pivot transfers with min A. PT Short Term Goal 2 (Week 1): Patient will demonstrate dynamic sitting balance with S.  PT Short Term Goal 3 (Week 1): Patient will ambulate 71' with LRAD and mod A PT Short Term Goal 4 (Week 1): Patient will perform w/c mobility S x continuous 150'   Skilled Therapeutic Interventions/Progress Updates:    Patient in supine and lethargic.  Aware of late HD session.  Noted pants half down and AKA dressing unravelled.  Doffed pants for redressing R AKA with 4x4's and 2 Ace wraps.   Participated in therex as noted below.  Patient performed sidelying hip extension with assist for positioning.  Patient supine to sit with mod A.  Sit to stand to RW elevated bed mod A to pull up pants, then SBT to w/c min A max cues for safety and increased time.  Performed w/c mobility 150' on unit with S and increased time.  Assisted SBT back to bed min A and sit to supine min a for positioning.  Left with call bell in reach and bed alarm activated.    Therapy Documentation Precautions:  Precautions Precautions: Fall Precaution Comments: wound VAC surgical incision. Right AKA. 2L oxygen Restrictions Weight Bearing Restrictions: Yes RLE Weight Bearing: Non weight bearing Pain: Pain Assessment Pain Scale: 0-10 Pain Score: 7  Pain Type: Surgical pain Pain Location: Leg Pain Orientation: Right Pain Descriptors / Indicators: Aching;Sore Pain Onset: On-going Pain Intervention(s): Repositioned Exercises: Amputee Exercises Towel Squeeze: Strengthening;Both;10 reps;Supine Hip ABduction/ADduction: Strengthening;Both;10 reps;Supine Knee Extension: AAROM;Left;10 reps;Supine(2 sets w/ 3# weight) Straight Leg Raises:  Strengthening;Right;Left;10 reps;Supine Other Treatments:      Therapy/Group: Individual Therapy  Reginia Naas  [place], PT 08/15/2018, 11:08 AM

## 2018-08-15 NOTE — Discharge Summary (Addendum)
Physician Discharge Summary  Patient ID: Howard Bell MRN: [id number] DOB/AGE: 12-11-1965 53 y.o.  Admit date: 08/07/2018 Discharge date: 08/15/2018  Discharge Diagnoses:  Principal Problem:   Sepsis [facility]) Active Problems:   Unilateral AKA, left ([place])   Anemia of chronic disease   Diabetes mellitus type 2 in nonobese (HCC)   Supplemental oxygen dependent   Labile blood pressure   Uncontrolled diabetes mellitus type 2 with peripheral artery disease (HCC)   Confusion, postoperative   Hypoglycemia   Drug induced constipation   Gangrene of left foot ([place])   Discharged Condition:  Guarded.   Significant Diagnostic Studies: Dg Chest 2 View  Result Date: 08/15/2018 CLINICAL DATA:  Fever. Left foot infection. History of diabetes, end-stage renal disease and pneumonia. EXAM: CHEST - 2 VIEW COMPARISON:  Radiographs 05/12/2018 and 05/02/2018. CT 05/02/2018 and 11/09/2017. FINDINGS: The heart size and mediastinal contours are stable. The patient has known chronic adenopathy in the right paratracheal and subcarinal regions which appears stable. Vascular stents are noted in the left subclavian and axillary vessels. Patchy right perihilar and lower lobe airspace opacities appear largely chronic, not significantly changed over the recent prior studies. The left lung is clear. There is no pleural effusion or pneumothorax. No acute osseous findings. IMPRESSION: No significant change in the appearance of the chest compared with prior studies obtained over the last 8 months. The right basilar airspace opacity appears chronic and may reflect postinflammatory scarring or chronic atypical inflammation. Grossly stable mediastinal adenopathy. Electronically Signed   By: Richardean Sale M.D.   On: 08/15/2018 16:13   Ct Head Wo Contrast  Result Date: 08/15/2018 CLINICAL DATA:  Altered mental status EXAM: CT HEAD WITHOUT CONTRAST TECHNIQUE: Contiguous axial images were obtained from the base of the skull  through the vertex without intravenous contrast. COMPARISON:  Head CT 11/06/2017 FINDINGS: Brain: No acute intracranial hemorrhage. Unchanged appearance of right parietal, left occipital and right cerebellar infarcts. No midline shift or other mass effect. Bilateral basal ganglia mineralization. Vascular: No abnormal hyperdensity of the major intracranial arteries or dural venous sinuses. No intracranial atherosclerosis. Skull: The visualized skull base, calvarium and extracranial soft tissues are normal. Sinuses/Orbits: No fluid levels or advanced mucosal thickening of the visualized paranasal sinuses. No mastoid or middle ear effusion. The orbits are normal. IMPRESSION: 1. No acute abnormality. 2. Unchanged appearance of left occipital, right parietal and right cerebellar infarcts. Electronically Signed   By: Ulyses Jarred M.D.   On: 08/15/2018 21:48   Dg Foot Complete Left  Result Date: 08/15/2018 CLINICAL DATA:  Fever. Left foot infection. History of diabetes, end-stage renal disease and pneumonia. EXAM: LEFT FOOT - COMPLETE 3+ VIEW COMPARISON:  None. FINDINGS: There is no evidence of acute fracture or dislocation. There are possible old healed fractures of the 4th and 5th metatarsal necks. There are minimal degenerative changes at the 1st metatarsophalangeal joint. No erosive changes or bone destruction identified. There are prominent vascular calcifications consistent with diabetes. The soft tissues are diffusely prominent without focal swelling or foreign body. IMPRESSION: No acute osseous findings or evidence of osteomyelitis. Prominent vascular calcifications. Electronically Signed   By: Richardean Sale M.D.   On: 08/15/2018 16:15    Labs:  Basic Metabolic Panel: Recent Labs  Lab 08/09/18 1400 08/11/18 1330 08/14/18 2149 08/14/18 2356  NA 130* 133*  --  129*  K 5.0 4.6  --  4.5  CL 92* 92*  --  89*  CO2 24 25  --  24  GLUCOSE 211*  100* 56* 136*  BUN 68* 69*  --  103*  CREATININE 11.82*  11.06*  --  13.65*  CALCIUM 8.6* 9.3  --  9.3  PHOS 5.4* 4.2  --  3.8    CBC: Recent Labs  Lab 08/11/18 1330 08/14/18 2356 08/15/18 1525  WBC 9.9 8.4 9.9  NEUTROABS  --   --  7.0  HGB 9.1* 8.4* 8.5*  HCT 29.3* 26.5* 27.2*  MCV 93.9 91.1 91.0  PLT 226 214 194    CBG: Recent Labs  Lab 08/15/18 0408 08/15/18 0745 08/15/18 1126 08/15/18 1721 08/15/18 2045  GLUCAP 82 128* 215* 143* 131*    Brief HPI:   Howard Bell is a 53 year old male with history of CHF, HTN, T2DM, ESRD, PAD, right foot ulcer 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  He developed dehiscence of wound with superficial abscess left heel and ulceration of left ankle.  He was admitted on 05/15//15 for revision to right AKA by Dr. Sharol Given.  Postop wound VAC to stay in place for a week.  He has had issues with pain control therefore gabapentin added to help with neuropathic pain as well as anemia of chronic disease requiring ESA.  Therapy evaluations completed and CIR was recommended due to deficits in mobility and ADLs.   Hospital Course: Howard Bell was admitted to rehab 08/07/2018 for inpatient therapies to consist of PT and OT at least three hours five days a week. Past admission physiatrist, therapy team and rehab RN have worked together to provide customized collaborative inpatient rehab.  Subcu heparin was used for DVT prophylaxis.  He was maintained on gabapentin as well as oxycodone and tramadol as needed for pain control.  Hemodialysis has been ongoing on Monday Wednesday Friday at the end of the day.  Anemia of chronic disease has been monitored and he continues on Aranesp weekly for supplement. Wound VAC was removed on 5/22 and right AKA site is clean dry and intact with sutures and staples in place.   As respiratory status improved he was in process of being weaned off oxygen.  Left foot wound was noted to be moist with mild erythema and Dr. Sharol Given was consulted for input.  He  recommended Silvadene with daily dressing changes.  Doxycycline was also resumed for wound prophylaxis.  Diabetes is monitored with AC at bedtime CBG checks and Glucotrol was added on for better blood sugar control on 5/20.  He has had issues with intermittent confusion in the past couple of days.  On 05/25, he developed persistent hypoglycemia and Glucotrol was discontinued.  Blood sugar had improved after hemodialysis on 5/267.   He was noted to have increasing confusion and nursing reported development of order from left shin foot wounds.  He spiked a temp of 101 later that day and Dr. Sharol Given was consulted for input.  He felt that patient with evidence of dried gangrene over Achilles as well as dorsal aspect and would likely require above-knee amputation for treatment however patient was not interested in any surgical intervention. Blood cultures ordered for work up and he was started on Vanc/Cefepime empirically. Left foot xray was negative for osteomyelitis and CXR was negative for acute changes.  [facility] hospitalist was consulted for input due to concerns of developing sepsis.  Dr. Berle Mull recommended transfer to acute hospital for closer monitoring.  He was discharged to acute services on 5/26.     Current Medications: . brimonidine  1 drop Both Eyes  BID  . calcium acetate  2,001 mg Oral TID WC  . Chlorhexidine Gluconate Cloth  6 each Topical Q0600  . darbepoetin (ARANESP) injection - DIALYSIS  150 mcg Intravenous Q Mon-HD  . dextrose  1 ampule Intravenous Once  . docusate sodium  100 mg Oral BID  . doxycycline  100 mg Oral Q12H  . feeding supplement (PRO-STAT SUGAR FREE 64)  30 mL Oral BID  . fluticasone  1 spray Each Nare Daily  . heparin  5,000 Units Subcutaneous Q8H  . insulin aspart  0-5 Units Subcutaneous QHS  . insulin aspart  0-9 Units Subcutaneous TID WC  . latanoprost  1 drop Both Eyes QHS  . loratadine  10 mg Oral QPM  . metoCLOPramide  5 mg Oral TID AC  . metoprolol succinate   50 mg Oral QHS  . pantoprazole  40 mg Oral Daily  . senna-docusate  3 tablet Oral BID  . sevelamer carbonate  800 mg Oral TID WC  . silver sulfADIAZINE   Topical Daily  . timolol  1 drop Both Eyes BID    Diet: Renal/Carb Modified 1200 cc FR.  Special Instructions:  1. Cleanse left shin/foot with soap and water. Pat dry and apply layer of silvadene. Needs dressing if out of bed.  2.  Prevalon boot left foot for pressure relief.   Discharge disposition: 02-Transferred to [facility]   Signed: Bary Leriche 08/15/2018, 10:15 PM Patient seen and examined by me on day of discharge. Delice Lesch, MD, ABPMR

## 2018-08-15 NOTE — Progress Notes (Signed)
Patient has had confusion and sleepy today. Did come back from HD early am but now febrile. Question sepsis due to left foot ulcer which has now developed order. BC/UC/CXR/CBC ordered. Will also check Xray of left foot/shin to rule out osteo. Dr. Sharol Given consulted to evaluate wound. R-AKA incision C/D/I with mild incisional irritation. Will also contact [place] to assist with management due to concerns of sepsis. Did have Proteus bacteremia earlier this year and MRSA + PCR.

## 2018-08-15 NOTE — Progress Notes (Addendum)
[facility] Progress Note   Subjective:  Seen in room - on phone with his wife asking her to pick him up. Confirmed with RN and spoke to his wife - he has been confused this morning. Did have some hypoglycemia overnight. Wife reports that this happens sometimes when he has been hospitalized in past. RN will inform rounding MD to eval.  Objective Vitals:   08/15/18 0430 08/15/18 0500 08/15/18 0540 08/15/18 0640  BP: 124/66 110/63 (!) 116/49 137/74  Pulse: (!) 111 (!) 114 (!) 114 (!) 110  Resp:   18 17  Temp:   97.7 F (36.5 C) 98 F (36.7 C)  TempSrc:   Oral   SpO2:   100% 91%  Weight:    98.1 kg  Height:       Physical Exam General: Well appearing man, confused this morning - able to redirect. NAD Heart: RRR; 2/6 murmur Lungs: CTAB Abdomen: soft, non-tender Extremities: R AKA bandaged. Trace LLE edema Dialysis Access: LUE AVF + thrill  Additional Objective Labs: Basic Metabolic Panel: Recent Labs  Lab 08/09/18 1400 08/11/18 1330 08/14/18 2149 08/14/18 2356  NA 130* 133*  --  129*  K 5.0 4.6  --  4.5  CL 92* 92*  --  89*  CO2 24 25  --  24  GLUCOSE 211* 100* 56* 136*  BUN 68* 69*  --  103*  CREATININE 11.82* 11.06*  --  13.65*  CALCIUM 8.6* 9.3  --  9.3  PHOS 5.4* 4.2  --  3.8   Liver Function Tests: Recent Labs  Lab 08/09/18 1400 08/11/18 1330 08/14/18 2356  ALBUMIN 2.4* 2.4* 2.2*   CBC: Recent Labs  Lab 08/09/18 1400 08/11/18 1330 08/14/18 2356  WBC 9.2 9.9 8.4  HGB 9.1* 9.1* 8.4*  HCT 29.0* 29.3* 26.5*  MCV 93.5 93.9 91.1  PLT 218 226 214   CBG: Recent Labs  Lab 08/15/18 0006 08/15/18 0039 08/15/18 0224 08/15/18 0408 08/15/18 0745  GLUCAP 70 59* 82 82 128*   Medications: . ferric gluconate (FERRLECIT/NULECIT) IV Stopped (08/15/18 0330)   . brimonidine  1 drop Both Eyes BID  . calcium acetate  2,001 mg Oral TID WC  . Chlorhexidine Gluconate Cloth  6 each Topical Q0600  . Chlorhexidine Gluconate Cloth  6 each Topical  Q0600  . darbepoetin (ARANESP) injection - DIALYSIS  150 mcg Intravenous Q Mon-HD  . dextrose  1 ampule Intravenous Once  . docusate sodium  100 mg Oral BID  . doxycycline  100 mg Oral Q12H  . feeding supplement (PRO-STAT SUGAR FREE 64)  30 mL Oral BID  . fluticasone  1 spray Each Nare Daily  . gabapentin  300 mg Oral QHS  . glucose      . heparin  5,000 Units Subcutaneous Q8H  . insulin aspart  0-5 Units Subcutaneous QHS  . insulin aspart  0-9 Units Subcutaneous TID WC  . latanoprost  1 drop Both Eyes QHS  . loratadine  10 mg Oral QPM  . metoCLOPramide  5 mg Oral TID AC  . metoprolol succinate  50 mg Oral QHS  . pantoprazole  40 mg Oral Daily  . senna-docusate  3 tablet Oral BID  . sevelamer carbonate  800 mg Oral TID WC  . timolol  1 drop Both Eyes BID    Dialysis Orders: SW MWF 4h 30min   450/2x  95.5kg   2/2.25 bath  P2  Hep 9000   L AVF Parsabiv 10mg  TIW  Mircera 150 mcg IV q 2 weeks (last 5/4)  Assessment/Plan: 1. 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 Sharol Given. Finishing doxycycline BID. In CIR. 2. ESRD: Continue HD per MWF schedule. HD tomorrow. 3. Hypertension/volume: BP good, will need to lower EDW on discharge. 4. Anemia: Hgb 8.4 (trending down). Continue Aranesp weekly - dose increased for this week and started course of IV iron. 5. Metabolic bone disease: Ca/Phos ok.Continue Ca acetate + Renvela as binder. Parsabiv not available in hospital. Follow labs.  6. T2DM:Insulin per primary. Hypoglycemia overnight 5/25 - glucotrol stopped. 7. Twitchiness: Could be reglan EPS vs gabapentin myoclonus, follow. 8.  AMS (5/26): Hypoglycemia v. meds v. delirium - follow closely.  Veneta Penton, PA-C 08/15/2018, 10:24 AM  [facility] Pager: [phone]  Pt seen, examined and agree w A/P as above.  Kelly Splinter  MD 08/15/2018, 3:25 PM

## 2018-08-15 NOTE — Progress Notes (Signed)
Temp of 101.1 oral. PA made aware.

## 2018-08-15 NOTE — H&P (Addendum)
[facility] History and Physical   Patient: Howard Bell TGP:[id number]   PCP: Horald Pollen, MD DOB: [birth year]   DOA: 08/15/2018   DOS: 08/15/2018   DOS: the patient was seen and examined on 08/15/2018  Patient coming from: The patient is coming from [place].  Chief Complaint: Fever  HPI: Howard Bell is a 53 y.o. male with Past medical history of ESRD on HD MWF, type II DM on insulin, HTN, diabetic gastroparesis, left foot gangrene, right AKA, OSA. The patient initially presented to the hospital on 08/04/2018 with dehiscence of right transtibial amputation patient underwent revision of right AKA and debridement of left heel ulceration.  Wound VAC was placed.  Patient was discharged on oral doxycycline. Gabapentin was added on 08/07/2018 for pain control. Glucotrol was added for diabetes control. Pain control was achieved with tramadol. In CIR patient was persistently hypoglycemic for last 48 hours.  Glucotrol was discontinued on 08/13/2018 already. Patient started becoming more confused on 08/15/2018.  Later in the afternoon he had a fever with tachycardia persistent throughout the day. At the time of my evaluation patient reports he continues to have pain in his leg.  No nausea no vomiting no chest pain no abdominal pain.  Medical regimen patient will go to sleep and will not answer questions.  Denies any headache or dizziness or any other focal deficit. With persistent fever and worsening mentation patient was referred for consultation, after evaluation of the patient it was felt that the patient would benefit from inpatient admission for further treatment of his encephalopathy as well as sepsis.  At his baseline ambulates with support And is independent for most of his ADL; manages his medication on his own.  Review of Systems: as mentioned in the history of present illness.  All other systems reviewed and are negative.  Past Medical History:  Diagnosis Date  .  Arthritis   . Congestive heart failure (CHF) ([place])   . Depression   . 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 Diabetic gastroparesis ([place]) 11/06/2017  . ESRD (end stage renal disease) on dialysis ([place])   . H/O seasonal allergies   . Hypertension   . Pneumonia   . Sleep apnea    Past Surgical History:  Procedure Laterality Date  . AMPUTATION Right 05/14/2018   Procedure: AMPUTATION BELOW KNEE;  Surgeon: Newt Minion, MD;  Location: [facility];  Service: Orthopedics;  Laterality: Right;  . AMPUTATION Right 08/04/2018   Procedure: RIGHT ABOVE KNEE AMPUTATION;  Surgeon: Newt Minion, MD;  Location: [facility];  Service: Orthopedics;  Laterality: Right;  . ESOPHAGOGASTRODUODENOSCOPY (EGD) WITH PROPOFOL N/A 12/05/2017   Procedure: ESOPHAGOGASTRODUODENOSCOPY (EGD) WITH PROPOFOL;  Surgeon: Doran Stabler, MD;  [facility];  Service: Gastroenterology;  Laterality: N/A;  . HERNIA REPAIR    . IR AV DIALY SHUNT INTRO NEEDLE/INTRACATH INITIAL W/PTA/IMG LEFT  03/30/2017   Social History:  reports that he has never smoked. He has never used smokeless tobacco. He reports that he does not drink alcohol or use drugs.  No Known Allergies  Family History  Problem Relation Age of Onset  . Diabetes Mother   . Hypertension Father      Prior to Admission medications   Medication Sig Start Date End Date Taking? Authorizing Provider  albuterol (PROVENTIL HFA;VENTOLIN HFA) 108 (90 Base) MCG/ACT inhaler Inhale 1 puff into the lungs every 4 (four) hours as needed for wheezing or shortness of breath.  [provider]  brimonidine (ALPHAGAN) 0.2 % ophthalmic solution Place 1 drop into both eyes 2 (two) times daily.    [provider]  calcium acetate (PHOSLO) 667 MG capsule Take 2,001 mg by mouth 3 (three) times daily with meals.     [provider]  docusate sodium (COLACE) 100 MG capsule Take 100 mg by mouth 2 (two) times daily.    [provider]  fluticasone (FLONASE) 50 MCG/ACT nasal spray Place 1 spray into both nostrils daily.    [provider]  gabapentin (NEURONTIN) 300 MG capsule Take 1 capsule (300 mg total) by mouth 2 (two) times daily. 08/07/18   Newt Minion, MD  insulin lispro (ADMELOG SOLOSTAR) 100 UNIT/ML KwikPen Inject 1-9 Units into the skin 3 (three) times daily. If bs is 121-150=1 unit, 151-200=2 units, 201-250=3 units, 251-300=5 units, 301-350=7 units, 351-400=9 units    [provider]  latanoprost (XALATAN) 0.005 % ophthalmic solution Place 1 drop into both eyes at bedtime.    [provider]  metoCLOPramide (REGLAN) 5 MG tablet Take 1 tablet (5 mg total) by mouth 3 (three) times daily before meals. 09/23/17   Doran Stabler, MD  metoprolol succinate (TOPROL-XL) 50 MG 24 hr tablet Take 1 tablet (50 mg total) by mouth at bedtime. Take with or immediately following a meal. 05/18/18   Charlynne Cousins, MD  omeprazole (PRILOSEC) 20 MG capsule Take 20 mg by mouth at bedtime.    [provider]  ondansetron (ZOFRAN) 4 MG tablet Take 1 tablet (4 mg total) by mouth every 8 (eight) hours as needed for nausea or vomiting. 05/02/18   Virgel Manifold, MD  sevelamer carbonate (RENVELA) 800 MG tablet TAKE 1 TABLET BY MOUTH DAILY WITH MEALS FOR HYPOCALCEMIA Patient taking differently: Take 800 mg by mouth 3 (three) times daily with meals.  12/13/17   Horald Pollen, MD  timolol (BETIMOL) 0.5 % ophthalmic solution Place 1 drop into both eyes 2 (two) times daily. 05/07/17   Ivar Drape D, PA    Physical Exam: There were no vitals filed for this visit.  General: Alert, Awake and Oriented to Time, Place and Person. Appear in moderate distress, affect appropriate, will go to sleep mid conversation Eyes: PERRL, Conjunctiva normal ENT: Oral Mucosa clear moist. Neck: no JVD, no Abnormal Mass Or lumps Cardiovascular: S1 and S2 Present, no Murmur, Peripheral Pulses Present  Respiratory: normal respiratory effort, Bilateral Air entry equal and Decreased, no use of accessory muscle, Clear to Auscultation, no Crackles, no wheezes Abdomen: Bowel Sound present, Soft and no tenderness, no hernia Skin: Left leg redness with warmth and tenderness on the ankle region, no Rash, no induration Extremities: no Pedal edema,  Neurologic: Grossly no focal neuro deficit. Bilaterally Equal motor strength Asterixis present.       Labs on Admission:  CBC: Recent Labs  Lab 08/09/18 1400 08/11/18 1330 08/14/18 2356 08/15/18 1525  WBC 9.2 9.9 8.4 9.9  NEUTROABS  --   --   --  7.0  HGB 9.1* 9.1* 8.4* 8.5*  HCT 29.0* 29.3* 26.5* 27.2*  MCV 93.5 93.9 91.1 91.0  PLT 218 226 214 631   Basic Metabolic Panel: Recent Labs  Lab 08/09/18 1400 08/11/18 1330 08/14/18 2149 08/14/18 2356 08/15/18 1608  NA 130* 133*  --  129* 131*  K 5.0 4.6  --  4.5 4.3  CL 92* 92*  --  89* 93*  CO2 24 25  --  24  26  GLUCOSE 211* 100* 56* 136* 163*  BUN 68* 69*  --  103* 50*  CREATININE 11.82* 11.06*  --  13.65* 8.40*  CALCIUM 8.6* 9.3  --  9.3 8.5*  PHOS 5.4* 4.2  --  3.8  --    GFR: Estimated Creatinine Clearance: 12.6 mL/min (A) (by C-G formula based on SCr of 8.4 mg/dL (H)). Liver Function Tests: Recent Labs  Lab 08/09/18 1400 08/11/18 1330 08/14/18 2356 08/15/18 1608  AST  --   --   --  18  ALT  --   --   --  8  ALKPHOS  --   --   --  118  BILITOT  --   --   --  0.4  PROT  --   --   --  7.5  ALBUMIN 2.4* 2.4* 2.2* 2.3*   No results for input(s): LIPASE, AMYLASE in the last 168 hours. No results for input(s): AMMONIA in the last 168 hours. Coagulation Profile: Recent Labs  Lab 08/15/18 1608  INR 1.2   Cardiac Enzymes: No results for input(s): CKTOTAL, CKMB, CKMBINDEX, TROPONINI in the last 168 hours. BNP (last 3 results) No results for input(s): PROBNP in the last 8760 hours. HbA1C: No results for input(s): HGBA1C in the last 72 hours. CBG: Recent Labs  Lab  08/15/18 0224 08/15/18 0408 08/15/18 0745 08/15/18 1126 08/15/18 1721  GLUCAP 82 82 128* 215* 143*   Lipid Profile: No results for input(s): CHOL, HDL, LDLCALC, TRIG, CHOLHDL, LDLDIRECT in the last 72 hours. Thyroid Function Tests: No results for input(s): TSH, T4TOTAL, FREET4, T3FREE, THYROIDAB in the last 72 hours. Anemia Panel: Recent Labs    08/15/18 1608  FERRITIN 1,085*   Urine analysis:    Component Value Date/Time   COLORURINE RED (A) 11/19/2016 0020   APPEARANCEUR TURBID (A) 11/19/2016 0020   LABSPEC  11/19/2016 0020    TEST NOT REPORTED DUE TO COLOR INTERFERENCE OF URINE PIGMENT   PHURINE  11/19/2016 0020    TEST NOT REPORTED DUE TO COLOR INTERFERENCE OF URINE PIGMENT   GLUCOSEU (A) 11/19/2016 0020    TEST NOT REPORTED DUE TO COLOR INTERFERENCE OF URINE PIGMENT   HGBUR (A) 11/19/2016 0020    TEST NOT REPORTED DUE TO COLOR INTERFERENCE OF URINE PIGMENT   BILIRUBINUR (A) 11/19/2016 0020    TEST NOT REPORTED DUE TO COLOR INTERFERENCE OF URINE PIGMENT   KETONESUR (A) 11/19/2016 0020    TEST NOT REPORTED DUE TO COLOR INTERFERENCE OF URINE PIGMENT   PROTEINUR (A) 11/19/2016 0020    TEST NOT REPORTED DUE TO COLOR INTERFERENCE OF URINE PIGMENT   NITRITE (A) 11/19/2016 0020    TEST NOT REPORTED DUE TO COLOR INTERFERENCE OF URINE PIGMENT   LEUKOCYTESUR (A) 11/19/2016 0020    TEST NOT REPORTED DUE TO COLOR INTERFERENCE OF URINE PIGMENT    Radiological Exams on Admission: Dg Chest 2 View  Result Date: 08/15/2018 CLINICAL DATA:  Fever. Left foot infection. History of diabetes, end-stage renal disease and pneumonia. EXAM: CHEST - 2 VIEW COMPARISON:  Radiographs 05/12/2018 and 05/02/2018. CT 05/02/2018 and 11/09/2017. FINDINGS: The heart size and mediastinal contours are stable. The patient has known chronic adenopathy in the right paratracheal and subcarinal regions which appears stable. Vascular stents are noted in the left subclavian and axillary vessels. Patchy right  perihilar and lower lobe airspace opacities appear largely chronic, not significantly changed over the recent prior studies. The left lung is clear. There is no pleural effusion or  pneumothorax. No acute osseous findings. IMPRESSION: No significant change in the appearance of the chest compared with prior studies obtained over the last 8 months. The right basilar airspace opacity appears chronic and may reflect postinflammatory scarring or chronic atypical inflammation. Grossly stable mediastinal adenopathy. Electronically Signed   By: Richardean Sale M.D.   On: 08/15/2018 16:13   Dg Foot Complete Left  Result Date: 08/15/2018 CLINICAL DATA:  Fever. Left foot infection. History of diabetes, end-stage renal disease and pneumonia. EXAM: LEFT FOOT - COMPLETE 3+ VIEW COMPARISON:  None. FINDINGS: There is no evidence of acute fracture or dislocation. There are possible old healed fractures of the 4th and 5th metatarsal necks. There are minimal degenerative changes at the 1st metatarsophalangeal joint. No erosive changes or bone destruction identified. There are prominent vascular calcifications consistent with diabetes. The soft tissues are diffusely prominent without focal swelling or foreign body. IMPRESSION: No acute osseous findings or evidence of osteomyelitis. Prominent vascular calcifications. Electronically Signed   By: Richardean Sale M.D.   On: 08/15/2018 16:15   Assessment/Plan 1. Sepsis ([place]) Present on admission. Secondary to chronic left heel gangrene. Severe PVD.  Although normal ABI With fever and tachycardia meets the criteria. Lactic acid normal procalcitonin level elevated. CRP 20.5. ESR more than 140. Ferritin level elevated. Concern is inadequately treated left heel gangrene. Blood culture performed. We will treat with IV cefepime, IV Flagyl, IV vancomycin for now.  Appreciate orthopedic assistance from Dr. Sharol Given who feels that the patient does not require any acute surgical  intervention but eventually is a candidate for left AKA.  2.  Acute metabolic encephalopathy. Patient has positive asterixis.  While he is oriented x3 he has poor attention and is unable to process information. Suspect this is in the setting of polypharmacy for pain control as well as hypoglycemia. We will also send metabolic work-up including ammonia level, B12, TSH free T4.  His serum creatinine consistently has remained about 10 despite him being on HD. BUN trending from [phone]. Concern for poor clearance with HD. Nephrology will be consulted will follow up. HD planned for tomorrow.  3. Chronic respiratory failure. Patient is on 2 L of oxygen. Reports that he has baseline on 2 L of oxygen. Although I am unable to see any documentation regarding this. Monitor for now. Chest x-ray performed already shows no acute evidence of abnormality. Incentive spirometry. SARS-CoV-2 is performed on 08/04/2018 negative.  And since then the patient has remained hospitalized therefore this test has not been repeated.  4.  Type 2 diabetes mellitus. Uncontrolled with hyper and hypoglycemia. With renal complication. On insulin at home. We will continue with insulin sliding scale. I need every 4 CBG despite patient being allowed diet for now due to his hypoglycemia Patient was started on Glucotrol. We will probably discontinue that medication going forward. Check hemoglobin A1c.  5. ESRD on HD. Dialysis MWF. Nephrology consulted.  6.  Anemia of chronic kidney disease. H&H stable. Monitor.  7.  Glaucoma. Continue home eyedrops.  8.  Diabetic gastroparesis. GERD. Continue PPI. Continue Reglan.  9.  Physical deconditioning. PT OT consulted. IP rehab consulted as well.  10.  Severe protein calorie malnutrition. Reported in the past. Check pro albumin. Dietary consultation.  11.  Chronic generalized pain. Patient also reports pain at the right BKA area. Currently given his  encephalopathy as well as instructions on holding off on any pain medication.   Nutrition: Renal diet DVT Prophylaxis: subcutaneous Heparin  Advance goals of care  discussion: Full code  Consults: Orthopedics Dr. Sharol Given following, CIR  Family Communication: no family was present at bedside, at the time of interview.  Disposition: Admitted as inpatient, telemetry unit. Likely to be discharged CIR, in 3 days.  Severity of Illness: The appropriate patient status for this patient is INPATIENT. Inpatient status is judged to be reasonable and necessary in order to provide the required intensity of service to ensure the patient's safety. The patient's presenting symptoms, physical exam findings, and initial radiographic and laboratory data in the context of their chronic comorbidities is felt to place them at high risk for further clinical deterioration. Furthermore, it is not anticipated that the patient will be medically stable for discharge from the hospital within 2 midnights of admission. The following factors support the patient status of inpatient.   " The patient's presenting symptoms include confusion and altered mental status as well as fever of 101. " The worrisome physical exam findings include tachycardia, tachypnea, asterixis. " The initial radiographic and laboratory data are worrisome because of elevated BUN, hypoglycemia. " The chronic co-morbidities include ESRD on HD, right leg gangrene, left AKA, chronic pain syndrome.   * I certify that at the point of admission it is my clinical judgment that the patient will require inpatient hospital care spanning beyond 2 midnights from the point of admission due to high intensity of service, high risk for further deterioration and high frequency of surveillance required.*    Author: Berle Mull, MD [facility] 08/15/2018  To reach On-call, see care teams to locate the attending and reach out to them via [email or web address]. If 7PM-7AM,  please contact night-coverage If you still have difficulty reaching the attending provider, please page the [facility] (Director on Call) for [facility] on amion for assistance.

## 2018-08-15 NOTE — Progress Notes (Signed)
Patient ID: Howard Bell, male   DOB: 04/10/1965, 53 y.o.   MRN: [id number] Patient is seen in follow-up for bilateral lower extremities with gangrenous changes to the left ankle and foot and a right above-the-knee amputation.  Examination of the right above-the-knee amputation the incision is well approximated there is no cellulitis he does have tenderness with manipulation but there is no signs of infection no gangrenous changes.  Examination of the left foot he has dry black gangrenous changes over the Achilles as well over the dorsal lateral aspect of the left foot.  There is no purulent drainage no ascending cellulitis.  Patient is not interested in any type of surgical intervention on the left lower extremity.  His only option would be a above-the-knee amputation on the left as well.  I will follow-up as needed.  No urgency for surgery on the left foot and ankle at this time.

## 2018-08-15 NOTE — Evaluation (Signed)
Speech Language Pathology Assessment and Plan  Patient Details  Name: Howard Bell MRN: [id number] Date of Birth: 06-22-1965  SLP Diagnosis: Cognitive Impairments  Rehab Potential: Fair ELOS: 08/23/18    Today's Date: 08/15/2018 SLP Individual Time: [id number] SLP Individual Time Calculation (min): 30 min and Today's Date: 08/15/2018 SLP Missed Time: 30 Minutes Missed Time Reason: Patient fatigue   Problem List:  Patient Active Problem List   Diagnosis Date Noted  . Hypoglycemia   . Drug induced constipation   . Confusion, postoperative   . Labile blood pressure   . Uncontrolled diabetes mellitus type 2 with peripheral artery disease ([place])   . Anemia of chronic disease   . Diabetes mellitus type 2 in nonobese (HCC)   . Supplemental oxygen dependent   . Unilateral AKA, left ([place]) 08/07/2018  . S/P BKA (below knee amputation), right ([place]) 08/04/2018  . Severe protein-calorie malnutrition ([place]) 08/01/2018  . Dehiscence of amputation stump ([place]) 08/01/2018  . Cutaneous abscess of left foot 08/01/2018  . Cellulitis of right lower extremity   . Influenza A 05/12/2018  . Pneumonia 05/12/2018  . Gangrene of right foot ([place])   . Critical limb ischemia with history of revascularization of same extremity 05/09/2018  . LUQ pain   . 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 ([place]) 11/16/2017  . CKD stage 5 due to type 2 diabetes mellitus ([place]) 11/16/2017  . Glaucoma due to type 2 diabetes mellitus ([place]) 11/16/2017  . Chronic generalized pain 11/16/2017  . Recurrent pneumonia 11/06/2017  . Diabetic gastroparesis ([place]) 11/06/2017  . Generalized abdominal pain 09/13/2017  . Acute pain of right shoulder 07/26/2017  . Acute bursitis of right shoulder 07/26/2017  . Left arm swelling 03/28/2017  . ESRD on dialysis ([place]) 03/28/2017  . Chest pain 11/26/2016  .  Essential hypertension 11/26/2016  . Anemia due to end stage renal disease ([place]) 08/04/2016  . Acute respiratory failure with hypoxemia ([place]) 08/04/2016  . Type 2 diabetes mellitus with foot ulcer ([place]) 08/04/2016  . Acute CHF (congestive heart failure) ([place]) 08/04/2016  . Elevated troponin    Past Medical History:  Past Medical History:  Diagnosis Date  . Arthritis   . Congestive heart failure (CHF) ([place])   . Depression   . 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 Diabetic gastroparesis ([place]) 11/06/2017  . ESRD (end stage renal disease) on dialysis ([place])   . H/O seasonal allergies   . Hypertension   . Pneumonia   . Sleep apnea    Past Surgical History:  Past Surgical History:  Procedure Laterality Date  . AMPUTATION Right 05/14/2018   Procedure: AMPUTATION BELOW KNEE;  Surgeon: Newt Minion, MD;  Location: [facility];  Service: Orthopedics;  Laterality: Right;  . AMPUTATION Right 08/04/2018   Procedure: RIGHT ABOVE KNEE AMPUTATION;  Surgeon: Newt Minion, MD;  Location: [facility];  Service: Orthopedics;  Laterality: Right;  . ESOPHAGOGASTRODUODENOSCOPY (EGD) WITH PROPOFOL N/A 12/05/2017   Procedure: ESOPHAGOGASTRODUODENOSCOPY (EGD) WITH PROPOFOL;  Surgeon: Doran Stabler, MD;  [facility];  Service: Gastroenterology;  Laterality: N/A;  . HERNIA REPAIR    . IR AV DIALY SHUNT INTRO NEEDLE/INTRACATH INITIAL W/PTA/IMG LEFT  03/30/2017    Assessment / Plan / Recommendation Clinical Impression Patient is a 53 year old male with history of CHF, HTN, T2DM, ESRD,PAD, Right diabetic foot  ulcer with infection requiring R-BKA 05/14/18 and was discharged to SNF. He developed dehiscence of wound as well as superficial abscess left heel and ulceration left ankle. He was admitted on 08/04/18 for revision to R-AKA by Dr. Sharol Given. Post op wound VAC to stay in place for a week. HD ongoing MWF and anemia of chronic disease stable on ESA.Gabapentin added today to help  with pain management. Therapy evaluations completed and CIR recommended due to deficits in mobility and ADLs and patient admitted 08/07/18. OT/PT noted cognitive deficits, therefore, a cognitive-linguistic evaluation was requested.  Patient demonstrates moderate-severe cognitive-linguistic deficits in sustained attention, functional problem solving, intellectual awareness, and short-term recall which impacts his safety with functional and familiar tasks. Patient also demonstrates delayed processing with higher-level word-finding deficits noted at the conversation level. However, suspect function impacted by fatigue throughout evaluation. Patient would benefit from skilled SLP intervention to maximize his cognitive-linguistic function prior to discharge.    Skilled Therapeutic Interventions          Administered a cognitive-linguistic evaluation, please see above for details. Educated patient in regards to current cognitive-linguistic impairments and goals of skilled SLP intervention, he verbalized understanding.   SLP Assessment  Patient will need skilled [place] [facility] during CIR admission    Recommendations  Oral Care Recommendations: Oral care BID Recommendations for Other Services: Neuropsych consult Patient destination: Home Follow up Recommendations: 24 hour supervision/assistance;Home Health SLP Equipment Recommended: None recommended by SLP    SLP Frequency 3 to 5 out of 7 days   SLP Duration  SLP Intensity  SLP Treatment/Interventions 08/23/18  Minumum of 1-2 x/day, 30 to 90 minutes  Cognitive remediation/compensation;Cueing hierarchy;Functional tasks;Patient/family education;Therapeutic Activities;Environmental controls;Internal/external aids;Speech/Language facilitation    Pain Pain Assessment Pain Scale: 0-10 Pain Score: 0-No pain Pain Type: Surgical pain Pain Location: Leg Pain Orientation: Right Pain Descriptors / Indicators: Aching;Sore Pain Onset:  On-going Pain Intervention(s): Repositioned   Short Term Goals: Week 1: SLP Short Term Goal 1 (Week 1): STGs=LTGs  Refer to Care Plan for Long Term Goals  Recommendations for other services: Neuropsych  Discharge Criteria: Patient will be discharged from SLP if patient refuses treatment 3 consecutive times without medical reason, if treatment goals not met, if there is a change in medical status, if patient makes no progress towards goals or if patient is discharged from hospital.  The above assessment, treatment plan, treatment alternatives and goals were discussed and mutually agreed upon: by patient  Howard Bell 08/15/2018, 2:06 PM

## 2018-08-15 NOTE — Telephone Encounter (Signed)
Dr. Duda aware.  

## 2018-08-16 ENCOUNTER — Inpatient Hospital Stay (HOSPITAL_COMMUNITY): Payer: Medicare Other

## 2018-08-16 ENCOUNTER — Encounter (HOSPITAL_COMMUNITY): Payer: Self-pay | Admitting: *Deleted

## 2018-08-16 ENCOUNTER — Other Ambulatory Visit: Payer: Self-pay

## 2018-08-16 ENCOUNTER — Inpatient Hospital Stay (HOSPITAL_COMMUNITY): Payer: Medicare Other | Admitting: Occupational Therapy

## 2018-08-16 ENCOUNTER — Inpatient Hospital Stay (HOSPITAL_COMMUNITY): Payer: Medicare Other | Admitting: Speech Pathology

## 2018-08-16 DIAGNOSIS — G8929 Other chronic pain: Secondary | ICD-10-CM

## 2018-08-16 DIAGNOSIS — E1143 Type 2 diabetes mellitus with diabetic autonomic (poly)neuropathy: Secondary | ICD-10-CM

## 2018-08-16 DIAGNOSIS — D631 Anemia in chronic kidney disease: Secondary | ICD-10-CM

## 2018-08-16 DIAGNOSIS — Z992 Dependence on renal dialysis: Secondary | ICD-10-CM

## 2018-08-16 DIAGNOSIS — Z794 Long term (current) use of insulin: Secondary | ICD-10-CM

## 2018-08-16 DIAGNOSIS — N186 End stage renal disease: Secondary | ICD-10-CM

## 2018-08-16 DIAGNOSIS — G9341 Metabolic encephalopathy: Secondary | ICD-10-CM

## 2018-08-16 DIAGNOSIS — E11621 Type 2 diabetes mellitus with foot ulcer: Secondary | ICD-10-CM

## 2018-08-16 DIAGNOSIS — K3184 Gastroparesis: Secondary | ICD-10-CM

## 2018-08-16 DIAGNOSIS — I10 Essential (primary) hypertension: Secondary | ICD-10-CM

## 2018-08-16 DIAGNOSIS — E43 Unspecified severe protein-calorie malnutrition: Secondary | ICD-10-CM

## 2018-08-16 DIAGNOSIS — I96 Gangrene, not elsewhere classified: Secondary | ICD-10-CM

## 2018-08-16 DIAGNOSIS — L97509 Non-pressure chronic ulcer of other part of unspecified foot with unspecified severity: Secondary | ICD-10-CM

## 2018-08-16 DIAGNOSIS — Z89511 Acquired absence of right leg below knee: Secondary | ICD-10-CM

## 2018-08-16 DIAGNOSIS — R52 Pain, unspecified: Secondary | ICD-10-CM

## 2018-08-16 LAB — COMPREHENSIVE METABOLIC PANEL
ALT: 8 U/L (ref 0–44)
AST: 17 U/L (ref 15–41)
Albumin: 2.2 g/dL — ABNORMAL LOW (ref 3.5–5.0)
Alkaline Phosphatase: 112 U/L (ref 38–126)
Anion gap: 16 — ABNORMAL HIGH (ref 5–15)
BUN: 64 mg/dL — ABNORMAL HIGH (ref 6–20)
CO2: 25 mmol/L (ref 22–32)
Calcium: 9.2 mg/dL (ref 8.9–10.3)
Chloride: 92 mmol/L — ABNORMAL LOW (ref 98–111)
Creatinine, Ser: 9.47 mg/dL — ABNORMAL HIGH (ref 0.61–1.24)
GFR calc Af Amer: 7 mL/min — ABNORMAL LOW (ref >60)
GFR calc non Af Amer: 6 mL/min — ABNORMAL LOW (ref >60)
Glucose, Bld: 108 mg/dL — ABNORMAL HIGH (ref 70–99)
Potassium: 4.4 mmol/L (ref 3.5–5.1)
Sodium: 133 mmol/L — ABNORMAL LOW (ref 135–145)
Total Bilirubin: 0.6 mg/dL (ref 0.3–1.2)
Total Protein: 7.7 g/dL (ref 6.5–8.1)

## 2018-08-16 LAB — CBC
HCT: 27.7 % — ABNORMAL LOW (ref 39.0–52.0)
Hemoglobin: 8.7 g/dL — ABNORMAL LOW (ref 13.0–17.0)
MCH: 28.5 pg (ref 26.0–34.0)
MCHC: 31.4 g/dL (ref 30.0–36.0)
MCV: 90.8 fL (ref 80.0–100.0)
Platelets: 209 10*3/uL (ref 150–400)
RBC: 3.05 MIL/uL — ABNORMAL LOW (ref 4.22–5.81)
RDW: 15.9 % — ABNORMAL HIGH (ref 11.5–15.5)
WBC: 10.2 10*3/uL (ref 4.0–10.5)
nRBC: 0 % (ref 0.0–0.2)

## 2018-08-16 LAB — GLUCOSE, CAPILLARY
Glucose-Capillary: 141 mg/dL — ABNORMAL HIGH (ref 70–99)
Glucose-Capillary: 187 mg/dL — ABNORMAL HIGH (ref 70–99)
Glucose-Capillary: 66 mg/dL — ABNORMAL LOW (ref 70–99)
Glucose-Capillary: 68 mg/dL — ABNORMAL LOW (ref 70–99)
Glucose-Capillary: 75 mg/dL (ref 70–99)
Glucose-Capillary: 91 mg/dL (ref 70–99)
Glucose-Capillary: 93 mg/dL (ref 70–99)
Glucose-Capillary: 93 mg/dL (ref 70–99)

## 2018-08-16 LAB — HIV ANTIBODY (ROUTINE TESTING W REFLEX): HIV Screen 4th Generation wRfx: NONREACTIVE

## 2018-08-16 MED ORDER — VANCOMYCIN HCL IN DEXTROSE 1-5 GM/200ML-% IV SOLN
INTRAVENOUS | Status: AC
  Administered 2018-08-16: 1000 mg via INTRAVENOUS
  Filled 2018-08-16: qty 200

## 2018-08-16 MED ORDER — DARBEPOETIN ALFA 150 MCG/0.3ML IJ SOSY: 150.0000 ug | PREFILLED_SYRINGE | INTRAMUSCULAR | Status: DC

## 2018-08-16 MED ORDER — METRONIDAZOLE IN NACL 5-0.79 MG/ML-% IV SOLN
500.0000 mg | Freq: Three times a day (TID) | INTRAVENOUS | Status: DC
  Administered 2018-08-16 – 2018-08-17 (×5): 500 mg via INTRAVENOUS
  Filled 2018-08-16 (×4): qty 100

## 2018-08-16 MED ORDER — OXYCODONE HCL 5 MG PO TABS
5.0000 mg | ORAL_TABLET | ORAL | Status: DC | PRN
  Administered 2018-08-16 (×2): 5 mg via ORAL
  Filled 2018-08-16 (×2): qty 1

## 2018-08-16 NOTE — Progress Notes (Signed)
PROGRESS NOTE    Howard Bell  JEH:[id number] DOB: [birth year] DOA: 08/15/2018 PCP: Horald Pollen, MD   Brief Narrative:  History of ESRD on hemodialysis Monday Wednesday Friday, type 2 diabetes mellitus on insulin, essential hypertension, diabetic gastroparesis, left-sided foot gangrene status, right-sided AKA, OSA initially came to the hospital 5/15 with dehiscence of the right transtibial amputation underwent revision of the right sided AKA and debridement of the left heel.  Wound VAC was placed and he was discharged to rehab on oral doxycycline.  Gabapentin was added for better pain control along with tramadol.  But in CIR he became persistently hypoglycemic therefore his Glucotrol was discontinued but due to some confusion and medical team was requested to admit the patient back to the hospital.   Assessment & Plan: More fluids Principal Problem:   Sepsis ([place]) Active Problems:   Anemia due to end stage renal disease ([name])   Type 2 diabetes mellitus with foot ulcer ([place])   Essential hypertension   ESRD on dialysis ([place])   Diabetic gastroparesis ([place])   Glaucoma due to type 2 diabetes mellitus ([place])   Chronic generalized pain   Severe protein-calorie malnutrition ([place])   S/P BKA (below knee amputation), right ([facility])   Unilateral AKA, left ([place])   Hypoglycemia   Gangrene of left foot ([place])   Acute metabolic encephalopathy  Sepsis secondary to acute on chronic left-sided heel gangrene History of severe peripheral vascular disease - Follow-up culture data.  Continue IV vancomycin, Flagyl and cefepime. - Eventually patient will require left-sided AKA but not urgent per orthopedic.  Acute metabolic encephalopathy - Currently patient is awake and alert to name and place but does not know today's date. -Routine work-up including ammonia level, B12 and TSH are within normal limits. -CT the head is negative -Closely monitor BUN level  ESRD on hemodialysis Monday  Wednesday Friday - Nephrology team is following and will dialyze the patient as needed  Chronic hypoxic respiratory failure on 2 L nasal cannula -Continue supplemental oxygen.  Uncontrolled diabetes mellitus type 2, insulin-dependent Diabetic gastroparesis -Continue insulin sliding scale and Accu-Chek. -Follow-up A1c -Continue home gabapentin and promotility agents  Generalized weakness and deconditioning - Currently patient is in inpatient rehabilitation.  Severe protein calorie malnutrition - Supportive care, encourage oral diet.   DVT prophylaxis: Subcutaneous heparin Code Status: Full code Family Communication: None at bedside Disposition Plan: Maintain in hospital stay until his mentation has improved.  In the meantime he needs to let his medications washout and receive inpatient dialysis.  Consultants:   Nephrology  Procedures:   None  Antimicrobials:   Vancomycin  Flagyl  Cefepime   Subjective: Patient is awake this morning but only alert to his name and place.  He is unable to tell me today's date.  Continues to tell me he wishes to go home.  No other complaints.  Review of Systems Otherwise negative except as per HPI, including: General: Denies fever, chills, night sweats or unintended weight loss. Resp: Denies cough, wheezing, shortness of breath. Cardiac: Denies chest pain, palpitations, orthopnea, paroxysmal nocturnal dyspnea. GI: Denies abdominal pain, nausea, vomiting, diarrhea or constipation GU: Denies dysuria, frequency, hesitancy or incontinence MS: Denies muscle aches, joint pain or swelling Neuro: Denies headache, neurologic deficits (focal weakness, numbness, tingling), abnormal gait Psych: Denies anxiety, depression, SI/HI/AVH Skin: Denies new rashes or lesions ID: Denies sick contacts, exotic exposures, travel  Objective: Vitals:   08/16/18 0021 08/16/18 0428 08/16/18 0501 08/16/18 1001  BP:  112/72  113/63  Pulse:  (!) 105  83   Resp:  14  20  Temp:  99 F (37.2 C)  98.6 F (37 C)  TempSrc:  Oral  Oral  SpO2:  95%  96%  Weight: 94.1 kg     Height:   6' (1.829 m)     Intake/Output Summary (Last 24 hours) at 08/16/2018 1204 Last data filed at 08/16/2018 0900 Gross per 24 hour  Intake 220 ml  Output -  Net 220 ml   Filed Weights   08/16/18 0021  Weight: 94.1 kg    Examination:  General exam: Appears calm and comfortable  Respiratory system: Clear to auscultation. Respiratory effort normal. Cardiovascular system: S1 & S2 heard, RRR. No JVD, murmurs, rubs, gallops or clicks. No pedal edema. Gastrointestinal system: Abdomen is nondistended, soft and nontender. No organomegaly or masses felt. Normal bowel sounds heard. Central nervous system: Alert and oriented. No focal neurological deficits. Extremities: Symmetric 4 x 5 power. Skin: LLE warmth and tenderness noted episodes ankle region. Psychiatry: Judgement and insight appear Poor. Awake alert but oriented to only name and place.     Data Reviewed:   CBC: Recent Labs  Lab 08/09/18 1400 08/11/18 1330 08/14/18 2356 08/15/18 1525 08/16/18 0416  WBC 9.2 9.9 8.4 9.9 10.2  NEUTROABS  --   --   --  7.0  --   HGB 9.1* 9.1* 8.4* 8.5* 8.7*  HCT 29.0* 29.3* 26.5* 27.2* 27.7*  MCV 93.5 93.9 91.1 91.0 90.8  PLT 218 226 214 194 518   Basic Metabolic Panel: Recent Labs  Lab 08/09/18 1400 08/11/18 1330 08/14/18 2149 08/14/18 2356 08/15/18 1608 08/16/18 0416  NA 130* 133*  --  129* 131* 133*  K 5.0 4.6  --  4.5 4.3 4.4  CL 92* 92*  --  89* 93* 92*  CO2 24 25  --  24 26 25   GLUCOSE 211* 100* 56* 136* 163* 108*  BUN 68* 69*  --  103* 50* 64*  CREATININE 11.82* 11.06*  --  13.65* 8.40* 9.47*  CALCIUM 8.6* 9.3  --  9.3 8.5* 9.2  PHOS 5.4* 4.2  --  3.8  --   --    GFR: Estimated Creatinine Clearance: 11 mL/min (A) (by C-G formula based on SCr of 9.47 mg/dL (H)). Liver Function Tests: Recent Labs  Lab 08/09/18 1400 08/11/18 1330 08/14/18  2356 08/15/18 1608 08/16/18 0416  AST  --   --   --  18 17  ALT  --   --   --  8 8  ALKPHOS  --   --   --  118 112  BILITOT  --   --   --  0.4 0.6  PROT  --   --   --  7.5 7.7  ALBUMIN 2.4* 2.4* 2.2* 2.3* 2.2*   No results for input(s): LIPASE, AMYLASE in the last 168 hours. Recent Labs  Lab 08/15/18 2101  AMMONIA 34   Coagulation Profile: Recent Labs  Lab 08/15/18 1608  INR 1.2   Cardiac Enzymes: No results for input(s): CKTOTAL, CKMB, CKMBINDEX, TROPONINI in the last 168 hours. BNP (last 3 results) No results for input(s): PROBNP in the last 8760 hours. HbA1C: Recent Labs    08/15/18 2101  HGBA1C 7.8*   CBG: Recent Labs  Lab 08/15/18 2045 08/15/18 2354 08/16/18 0358 08/16/18 0757 08/16/18 1114  GLUCAP 131* 109* 93 93 91   Lipid Profile: No results for input(s): CHOL, HDL, LDLCALC, TRIG, CHOLHDL, LDLDIRECT  in the last 72 hours. Thyroid Function Tests: Recent Labs    08/15/18 2101  TSH 7.538*  FREET4 1.06   Anemia Panel: Recent Labs    08/15/18 1608 08/15/18 2101  VITAMINB12  --  431  FERRITIN 1,085*  --    Sepsis Labs: Recent Labs  Lab 08/15/18 1608 08/15/18 2101  PROCALCITON 3.02  --   LATICACIDVEN 1.5 1.0    Recent Results (from the past 240 hour(s))  Culture, blood (routine x 2)     Status: None (Preliminary result)   Collection Time: 08/15/18  3:20 PM  Result Value Ref Range Status   Specimen Description BLOOD LEFT ANTECUBITAL  Final   Special Requests   Final    BOTTLES DRAWN AEROBIC ONLY Blood Culture adequate volume   Culture   Final    NO GROWTH < 24 HOURS Performed at [facility], [place] [address] [place], [place] [address]    Report Status PENDING  Incomplete  Culture, blood (routine x 2)     Status: None (Preliminary result)   Collection Time: 08/15/18  3:26 PM  Result Value Ref Range Status   Specimen Description BLOOD LEFT ANTECUBITAL  Final   Special Requests   Final    BOTTLES DRAWN AEROBIC ONLY Blood Culture  adequate volume   Culture   Final    NO GROWTH < 24 HOURS Performed at [facility], [place] [address] [place], [place] [address]    Report Status PENDING  Incomplete         Radiology Studies: Dg Chest 2 View  Result Date: 08/15/2018 CLINICAL DATA:  Fever. Left foot infection. History of diabetes, end-stage renal disease and pneumonia. EXAM: CHEST - 2 VIEW COMPARISON:  Radiographs 05/12/2018 and 05/02/2018. CT 05/02/2018 and 11/09/2017. FINDINGS: The heart size and mediastinal contours are stable. The patient has known chronic adenopathy in the right paratracheal and subcarinal regions which appears stable. Vascular stents are noted in the left subclavian and axillary vessels. Patchy right perihilar and lower lobe airspace opacities appear largely chronic, not significantly changed over the recent prior studies. The left lung is clear. There is no pleural effusion or pneumothorax. No acute osseous findings. IMPRESSION: No significant change in the appearance of the chest compared with prior studies obtained over the last 8 months. The right basilar airspace opacity appears chronic and may reflect postinflammatory scarring or chronic atypical inflammation. Grossly stable mediastinal adenopathy. Electronically Signed   By: Richardean Sale M.D.   On: 08/15/2018 16:13   Ct Head Wo Contrast  Result Date: 08/15/2018 CLINICAL DATA:  Altered mental status EXAM: CT HEAD WITHOUT CONTRAST TECHNIQUE: Contiguous axial images were obtained from the base of the skull through the vertex without intravenous contrast. COMPARISON:  Head CT 11/06/2017 FINDINGS: Brain: No acute intracranial hemorrhage. Unchanged appearance of right parietal, left occipital and right cerebellar infarcts. No midline shift or other mass effect. Bilateral basal ganglia mineralization. Vascular: No abnormal hyperdensity of the major intracranial arteries or dural venous sinuses. No intracranial atherosclerosis. Skull: The visualized  skull base, calvarium and extracranial soft tissues are normal. Sinuses/Orbits: No fluid levels or advanced mucosal thickening of the visualized paranasal sinuses. No mastoid or middle ear effusion. The orbits are normal. IMPRESSION: 1. No acute abnormality. 2. Unchanged appearance of left occipital, right parietal and right cerebellar infarcts. Electronically Signed   By: Ulyses Jarred M.D.   On: 08/15/2018 21:48   Dg Foot Complete Left  Result Date: 08/15/2018 CLINICAL DATA:  Fever. Left foot infection. History of diabetes, end-stage renal disease and pneumonia. EXAM: LEFT FOOT - COMPLETE 3+ VIEW COMPARISON:  None. FINDINGS: There is no evidence of acute fracture or dislocation. There are possible old healed fractures of the 4th and 5th metatarsal necks. There are minimal degenerative changes at the 1st metatarsophalangeal joint. No erosive changes or bone destruction identified. There are prominent vascular calcifications consistent with diabetes. The soft tissues are diffusely prominent without focal swelling or foreign body. IMPRESSION: No acute osseous findings or evidence of osteomyelitis. Prominent vascular calcifications. Electronically Signed   By: Richardean Sale M.D.   On: 08/15/2018 16:15        Scheduled Meds: . brimonidine  1 drop Both Eyes BID  . calcium acetate  2,001 mg Oral TID WC  . [START ON 08/21/2018] darbepoetin (ARANESP) injection - DIALYSIS  150 mcg Intravenous Q Mon-HD  . docusate sodium  100 mg Oral BID  . fluticasone  1 spray Each Nare Daily  . heparin  5,000 Units Subcutaneous Q8H  . insulin aspart  0-9 Units Subcutaneous Q4H  . latanoprost  1 drop Both Eyes QHS  . metoCLOPramide  5 mg Oral TID AC  . metoprolol succinate  50 mg Oral QHS  . pantoprazole  40 mg Oral Daily  . sevelamer carbonate  800 mg Oral TID WC  . sodium chloride flush  3 mL Intravenous Q12H  . timolol  1 drop Both Eyes BID   Continuous Infusions: . ceFEPime (MAXIPIME) IV    . metronidazole  500 mg (08/16/18 1019)  . vancomycin       LOS: 1 day   Time spent= 35 mins     Arsenio Loader, MD [facility]  If 7PM-7AM, please contact night-coverage [email or web address] 08/16/2018, 12:04 PM

## 2018-08-16 NOTE — Progress Notes (Signed)
Social Work  Discharge Note  The overall goal for the admission was met for:   Discharge location: NO - pt transferred to acute due to medical issues  Length of Stay: No  Discharge activity level: No  Home/community participation: No  Services provided included: MD, RD, PT, OT, SLP, RN, Pharmacy and [place]: Medicare  Follow-up services arranged: NA  Comments (or additional information): At the time of transfer, CIR and targeted d/c goals of minimal assistance overall and SHORT distance ambulation.  Targeted d/c date had been set for 6/3.  Wife is able to provide this level of care and is currently not working due to [employer] closing.    Patient/Family verbalized understanding of follow-up arrangements: NA  Individual responsible for coordination of the follow-up plan: NA  Confirmed correct DME delivered:  NA    Howard Bell

## 2018-08-16 NOTE — Progress Notes (Signed)
OT Cancellation Note  Patient Details Name: Howard Bell MRN: [id number] DOB: [birth year]   Cancelled Treatment:    Reason Eval/Treat Not Completed: Fatigue/lethargy limiting ability to participate(Pt unable to fully arouse and become alert for session.)   OT to follow up next available treatment time.  Ebony Hail Harold Hedge) Marsa Aris OTR/L Acute Rehabilitation Services Pager: [phone] Office: [place] 08/16/2018, 8:45 AM

## 2018-08-16 NOTE — Progress Notes (Addendum)
[facility] Progress Note   Subjective:  Transferred back from CIR to acute floor d/t AMS and fever - concern that L leg is infected now. Dr. Sharol Given consulted and recommended L AKA as well - extensive dry gangrene noted over L achilles region. BCx pending, resumed Vanc/Cefepime.   Objective Vitals:   08/16/18 0021 08/16/18 0428 08/16/18 0501 08/16/18 1001  BP:  112/72  113/63  Pulse:  (!) 105  83  Resp:  14  20  Temp:  99 F (37.2 C)  98.6 F (37 C)  TempSrc:  Oral  Oral  SpO2:  95%  96%  Weight: 94.1 kg     Height:   6' (1.829 m)    Physical Exam General: Slightly diaphoretic, slow responses but less confused. Heart: RRR; 2/6 murmur Lungs: CTAB Abdomen: soft Extremities: R AKA bandaged; LLE without edema; scattered wounds - large wound bandaged/not examined (see photos under "media") Dialysis Access: LUE AVF + thrill  Additional Objective Labs: Basic Metabolic Panel: Recent Labs  Lab 08/09/18 1400 08/11/18 1330  08/14/18 2356 08/15/18 1608 08/16/18 0416  NA 130* 133*  --  129* 131* 133*  K 5.0 4.6  --  4.5 4.3 4.4  CL 92* 92*  --  89* 93* 92*  CO2 24 25  --  24 26 25   GLUCOSE 211* 100*   < > 136* 163* 108*  BUN 68* 69*  --  103* 50* 64*  CREATININE 11.82* 11.06*  --  13.65* 8.40* 9.47*  CALCIUM 8.6* 9.3  --  9.3 8.5* 9.2  PHOS 5.4* 4.2  --  3.8  --   --    < > = values in this interval not displayed.   Liver Function Tests: Recent Labs  Lab 08/14/18 2356 08/15/18 1608 08/16/18 0416  AST  --  18 17  ALT  --  8 8  ALKPHOS  --  118 112  BILITOT  --  0.4 0.6  PROT  --  7.5 7.7  ALBUMIN 2.2* 2.3* 2.2*   CBC: Recent Labs  Lab 08/09/18 1400 08/11/18 1330 08/14/18 2356 08/15/18 1525 08/16/18 0416  WBC 9.2 9.9 8.4 9.9 10.2  NEUTROABS  --   --   --  7.0  --   HGB 9.1* 9.1* 8.4* 8.5* 8.7*  HCT 29.0* 29.3* 26.5* 27.2* 27.7*  MCV 93.5 93.9 91.1 91.0 90.8  PLT 218 226 214 194 209   Blood Culture    Component Value Date/Time   SDES BLOOD  LEFT ANTECUBITAL 08/15/2018 1526   SPECREQUEST  08/15/2018 1526    BOTTLES DRAWN AEROBIC ONLY Blood Culture adequate volume   CULT  08/15/2018 1526    NO GROWTH < 24 HOURS Performed at [facility] [address], [place], [place] [address]    REPTSTATUS PENDING 08/15/2018 1526   CBG: Recent Labs  Lab 08/15/18 1721 08/15/18 2045 08/15/18 2354 08/16/18 0358 08/16/18 0757  GLUCAP 143* 131* 109* 93 93   Iron Studies:  Recent Labs    08/15/18 1608  FERRITIN 1,085*   Studies/Results: Dg Chest 2 View  Result Date: 08/15/2018 CLINICAL DATA:  Fever. Left foot infection. History of diabetes, end-stage renal disease and pneumonia. EXAM: CHEST - 2 VIEW COMPARISON:  Radiographs 05/12/2018 and 05/02/2018. CT 05/02/2018 and 11/09/2017. FINDINGS: The heart size and mediastinal contours are stable. The patient has known chronic adenopathy in the right paratracheal and subcarinal regions which appears stable. Vascular stents are noted in the left subclavian and  axillary vessels. Patchy right perihilar and lower lobe airspace opacities appear largely chronic, not significantly changed over the recent prior studies. The left lung is clear. There is no pleural effusion or pneumothorax. No acute osseous findings. IMPRESSION: No significant change in the appearance of the chest compared with prior studies obtained over the last 8 months. The right basilar airspace opacity appears chronic and may reflect postinflammatory scarring or chronic atypical inflammation. Grossly stable mediastinal adenopathy. Electronically Signed   By: Richardean Sale M.D.   On: 08/15/2018 16:13   Ct Head Wo Contrast  Result Date: 08/15/2018 CLINICAL DATA:  Altered mental status EXAM: CT HEAD WITHOUT CONTRAST TECHNIQUE: Contiguous axial images were obtained from the base of the skull through the vertex without intravenous contrast. COMPARISON:  Head CT 11/06/2017 FINDINGS: Brain: No acute intracranial hemorrhage. Unchanged  appearance of right parietal, left occipital and right cerebellar infarcts. No midline shift or other mass effect. Bilateral basal ganglia mineralization. Vascular: No abnormal hyperdensity of the major intracranial arteries or dural venous sinuses. No intracranial atherosclerosis. Skull: The visualized skull base, calvarium and extracranial soft tissues are normal. Sinuses/Orbits: No fluid levels or advanced mucosal thickening of the visualized paranasal sinuses. No mastoid or middle ear effusion. The orbits are normal. IMPRESSION: 1. No acute abnormality. 2. Unchanged appearance of left occipital, right parietal and right cerebellar infarcts. Electronically Signed   By: Ulyses Jarred M.D.   On: 08/15/2018 21:48   Dg Foot Complete Left  Result Date: 08/15/2018 CLINICAL DATA:  Fever. Left foot infection. History of diabetes, end-stage renal disease and pneumonia. EXAM: LEFT FOOT - COMPLETE 3+ VIEW COMPARISON:  None. FINDINGS: There is no evidence of acute fracture or dislocation. There are possible old healed fractures of the 4th and 5th metatarsal necks. There are minimal degenerative changes at the 1st metatarsophalangeal joint. No erosive changes or bone destruction identified. There are prominent vascular calcifications consistent with diabetes. The soft tissues are diffusely prominent without focal swelling or foreign body. IMPRESSION: No acute osseous findings or evidence of osteomyelitis. Prominent vascular calcifications. Electronically Signed   By: Richardean Sale M.D.   On: 08/15/2018 16:15   Medications: . ceFEPime (MAXIPIME) IV    . metronidazole 500 mg (08/16/18 1019)  . vancomycin     . brimonidine  1 drop Both Eyes BID  . calcium acetate  2,001 mg Oral TID WC  . docusate sodium  100 mg Oral BID  . fluticasone  1 spray Each Nare Daily  . heparin  5,000 Units Subcutaneous Q8H  . insulin aspart  0-9 Units Subcutaneous Q4H  . latanoprost  1 drop Both Eyes QHS  . metoCLOPramide  5 mg Oral  TID AC  . metoprolol succinate  50 mg Oral QHS  . pantoprazole  40 mg Oral Daily  . sevelamer carbonate  800 mg Oral TID WC  . sodium chloride flush  3 mL Intravenous Q12H  . timolol  1 drop Both Eyes BID    Dialysis Orders: SW MWF 4h 71min 450/2x 95.5kg 2/2.25 bath P2 Hep 9000 L AVF - Parsabiv 10mg  TIW - Mircera 150 mcg IV q 2 weeks (last 5/4)  Assessment/Plan: 1. 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 Sharol Given.  2. LLE gangrene (new): Likely infected; BCx drawn 5/26 (pending) - restarted Vanc/Cefepime. Dr. Sharol Given consulted and has recommended L AKA as well. 3. ESRD: Continue HD per MWF schedule - HD later today. 4. Hypertension/volume: BP good, will need to lower EDW on discharge. 5.  Anemia: Hgb 8.7. Continue Aranesp 132mcg q Monday. IV iron held. 6. Metabolic bone disease: Ca/Phos ok.Continue Ca acetate + Renvela as binder. Parsabiv not available in hospital. Follow labs.  7. T2DM:Insulin per primary. Hypoglycemia overnight 5/25 - glucotrol stopped. 8. Twitchiness: Could be reglan EPS vs gabapentin myoclonus, follow. 9.  AMS (5/26): Hypoglycemia v. meds v. delirium; now likely due to #2.  Veneta Penton, PA-C 08/16/2018, 10:54 AM  [facility] Pager: [phone]  Pt seen, examined and agree w A/P as above.  Kelly Splinter  MD 08/16/2018, 3:39 PM

## 2018-08-16 NOTE — Progress Notes (Signed)
PT Cancellation Note  Patient Details Name: Howard Bell MRN: [id number] DOB: 07-22-1965   Cancelled Treatment:    Reason Eval/Treat Not Completed: Patient at procedure or test/unavailable   Reinaldo Berber, PT, DPT Acute Rehabilitation Services Pager: [phone] Office: [place] 08/16/2018, 1:05 PM

## 2018-08-16 NOTE — Progress Notes (Signed)
Pt currently at HD. OT cancellation note at this time.   Ebony Hail Harold Hedge) Marsa Aris OTR/L Acute Rehabilitation Services Pager: [phone] Office: [place], OT

## 2018-08-17 ENCOUNTER — Inpatient Hospital Stay: Payer: Medicare Other | Admitting: Orthopedic Surgery

## 2018-08-17 ENCOUNTER — Inpatient Hospital Stay (HOSPITAL_REHABILITATION)
Admission: RE | Admit: 2018-08-17 | Discharge: 2018-08-30 | Disposition: A | Discharge status: Home / Self Care | Payer: Medicare Other | Source: Ambulatory Visit | Attending: Physical Medicine & Rehabilitation | Admitting: Physical Medicine & Rehabilitation

## 2018-08-17 ENCOUNTER — Encounter (HOSPITAL_COMMUNITY): Payer: Self-pay | Admitting: *Deleted

## 2018-08-17 ENCOUNTER — Other Ambulatory Visit: Payer: Self-pay

## 2018-08-17 DIAGNOSIS — Z89611 Acquired absence of right leg above knee: Secondary | ICD-10-CM

## 2018-08-17 DIAGNOSIS — E1165 Type 2 diabetes mellitus with hyperglycemia: Secondary | ICD-10-CM | POA: Diagnosis present

## 2018-08-17 DIAGNOSIS — G546 Phantom limb syndrome with pain: Secondary | ICD-10-CM | POA: Diagnosis present

## 2018-08-17 DIAGNOSIS — E669 Obesity, unspecified: Secondary | ICD-10-CM | POA: Diagnosis present

## 2018-08-17 DIAGNOSIS — S78111A Complete traumatic amputation at level between right hip and knee, initial encounter: Secondary | ICD-10-CM | POA: Diagnosis present

## 2018-08-17 DIAGNOSIS — Z79899 Other long term (current) drug therapy: Secondary | ICD-10-CM

## 2018-08-17 DIAGNOSIS — E1152 Type 2 diabetes mellitus with diabetic peripheral angiopathy with gangrene: Secondary | ICD-10-CM | POA: Diagnosis present

## 2018-08-17 DIAGNOSIS — E1142 Type 2 diabetes mellitus with diabetic polyneuropathy: Secondary | ICD-10-CM | POA: Diagnosis present

## 2018-08-17 DIAGNOSIS — I70202 Unspecified atherosclerosis of native arteries of extremities, left leg: Secondary | ICD-10-CM

## 2018-08-17 DIAGNOSIS — I739 Peripheral vascular disease, unspecified: Secondary | ICD-10-CM

## 2018-08-17 DIAGNOSIS — E1122 Type 2 diabetes mellitus with diabetic chronic kidney disease: Secondary | ICD-10-CM | POA: Diagnosis present

## 2018-08-17 DIAGNOSIS — Z79890 Hormone replacement therapy (postmenopausal): Secondary | ICD-10-CM

## 2018-08-17 DIAGNOSIS — R6510 Systemic inflammatory response syndrome (SIRS) of non-infectious origin without acute organ dysfunction: Secondary | ICD-10-CM

## 2018-08-17 DIAGNOSIS — R41841 Cognitive communication deficit: Secondary | ICD-10-CM | POA: Diagnosis present

## 2018-08-17 DIAGNOSIS — R0902 Hypoxemia: Secondary | ICD-10-CM | POA: Diagnosis present

## 2018-08-17 DIAGNOSIS — Z79891 Long term (current) use of opiate analgesic: Secondary | ICD-10-CM

## 2018-08-17 DIAGNOSIS — Z992 Dependence on renal dialysis: Secondary | ICD-10-CM

## 2018-08-17 DIAGNOSIS — I96 Gangrene, not elsewhere classified: Secondary | ICD-10-CM

## 2018-08-17 DIAGNOSIS — Z6829 Body mass index (BMI) 29.0-29.9, adult: Secondary | ICD-10-CM

## 2018-08-17 DIAGNOSIS — G4733 Obstructive sleep apnea (adult) (pediatric): Secondary | ICD-10-CM | POA: Diagnosis present

## 2018-08-17 DIAGNOSIS — E8889 Other specified metabolic disorders: Secondary | ICD-10-CM | POA: Diagnosis present

## 2018-08-17 DIAGNOSIS — Z794 Long term (current) use of insulin: Secondary | ICD-10-CM

## 2018-08-17 DIAGNOSIS — M25512 Pain in left shoulder: Secondary | ICD-10-CM | POA: Diagnosis not present

## 2018-08-17 DIAGNOSIS — N186 End stage renal disease: Secondary | ICD-10-CM | POA: Diagnosis present

## 2018-08-17 DIAGNOSIS — Z8249 Family history of ischemic heart disease and other diseases of the circulatory system: Secondary | ICD-10-CM

## 2018-08-17 DIAGNOSIS — E1143 Type 2 diabetes mellitus with diabetic autonomic (poly)neuropathy: Secondary | ICD-10-CM | POA: Diagnosis present

## 2018-08-17 DIAGNOSIS — Z833 Family history of diabetes mellitus: Secondary | ICD-10-CM

## 2018-08-17 DIAGNOSIS — G8918 Other acute postprocedural pain: Secondary | ICD-10-CM

## 2018-08-17 DIAGNOSIS — K5900 Constipation, unspecified: Secondary | ICD-10-CM | POA: Diagnosis present

## 2018-08-17 DIAGNOSIS — E11649 Type 2 diabetes mellitus with hypoglycemia without coma: Secondary | ICD-10-CM | POA: Diagnosis present

## 2018-08-17 DIAGNOSIS — I509 Heart failure, unspecified: Secondary | ICD-10-CM | POA: Diagnosis present

## 2018-08-17 DIAGNOSIS — H409 Unspecified glaucoma: Secondary | ICD-10-CM | POA: Diagnosis present

## 2018-08-17 DIAGNOSIS — D631 Anemia in chronic kidney disease: Secondary | ICD-10-CM | POA: Diagnosis present

## 2018-08-17 DIAGNOSIS — Z4781 Encounter for orthopedic aftercare following surgical amputation: Secondary | ICD-10-CM

## 2018-08-17 DIAGNOSIS — K3184 Gastroparesis: Secondary | ICD-10-CM | POA: Diagnosis present

## 2018-08-17 DIAGNOSIS — R7309 Other abnormal glucose: Secondary | ICD-10-CM

## 2018-08-17 DIAGNOSIS — I132 Hypertensive heart and chronic kidney disease with heart failure and with stage 5 chronic kidney disease, or end stage renal disease: Secondary | ICD-10-CM | POA: Diagnosis present

## 2018-08-17 LAB — GLUCOSE, CAPILLARY
Glucose-Capillary: 178 mg/dL — ABNORMAL HIGH (ref 70–99)
Glucose-Capillary: 148 mg/dL — ABNORMAL HIGH (ref 70–99)
Glucose-Capillary: 191 mg/dL — ABNORMAL HIGH (ref 70–99)
Glucose-Capillary: 202 mg/dL — ABNORMAL HIGH (ref 70–99)
Glucose-Capillary: 215 mg/dL — ABNORMAL HIGH (ref 70–99)
Glucose-Capillary: 269 mg/dL — ABNORMAL HIGH (ref 70–99)

## 2018-08-17 LAB — BASIC METABOLIC PANEL
Anion gap: 12 (ref 5–15)
BUN: 33 mg/dL — ABNORMAL HIGH (ref 6–20)
CO2: 24 mmol/L (ref 22–32)
Calcium: 8.8 mg/dL — ABNORMAL LOW (ref 8.9–10.3)
Chloride: 94 mmol/L — ABNORMAL LOW (ref 98–111)
Creatinine, Ser: 6.36 mg/dL — ABNORMAL HIGH (ref 0.61–1.24)
GFR calc Af Amer: 11 mL/min — ABNORMAL LOW (ref >60)
GFR calc non Af Amer: 9 mL/min — ABNORMAL LOW (ref >60)
Glucose, Bld: 168 mg/dL — ABNORMAL HIGH (ref 70–99)
Potassium: 4 mmol/L (ref 3.5–5.1)
Sodium: 130 mmol/L — ABNORMAL LOW (ref 135–145)

## 2018-08-17 LAB — MAGNESIUM: Magnesium: 2 mg/dL (ref 1.7–2.4)

## 2018-08-17 LAB — CBC
HCT: 26.9 % — ABNORMAL LOW (ref 39.0–52.0)
Hemoglobin: 8.5 g/dL — ABNORMAL LOW (ref 13.0–17.0)
MCH: 28.8 pg (ref 26.0–34.0)
MCHC: 31.6 g/dL (ref 30.0–36.0)
MCV: 91.2 fL (ref 80.0–100.0)
Platelets: 220 10*3/uL (ref 150–400)
RBC: 2.95 MIL/uL — ABNORMAL LOW (ref 4.22–5.81)
RDW: 16 % — ABNORMAL HIGH (ref 11.5–15.5)
WBC: 10.7 10*3/uL — ABNORMAL HIGH (ref 4.0–10.5)
nRBC: 0.2 % (ref 0.0–0.2)

## 2018-08-17 LAB — HEMOGLOBIN A1C
Hgb A1c MFr Bld: 7.5 % — ABNORMAL HIGH (ref 4.8–5.6)
Mean Plasma Glucose: 168.55 mg/dL

## 2018-08-17 MED ORDER — CALCIUM ACETATE (PHOS BINDER) 667 MG PO CAPS
2001.0000 mg | ORAL_CAPSULE | Freq: Three times a day (TID) | ORAL | Status: DC
  Administered 2018-08-18 (×2): 2001 mg via ORAL
  Filled 2018-08-17 (×4): qty 3

## 2018-08-17 MED ORDER — METOPROLOL SUCCINATE ER 50 MG PO TB24
50.0000 mg | ORAL_TABLET | Freq: Every day | ORAL | Status: DC
  Administered 2018-08-17 – 2018-08-29 (×13): 50 mg via ORAL
  Filled 2018-08-17 (×12): qty 1

## 2018-08-17 MED ORDER — GUAIFENESIN-DM 100-10 MG/5ML PO SYRP
5.0000 mL | ORAL_SOLUTION | Freq: Four times a day (QID) | ORAL | Status: DC | PRN
  Administered 2018-08-26: 10 mL via ORAL
  Filled 2018-08-17: qty 10

## 2018-08-17 MED ORDER — VANCOMYCIN HCL IN DEXTROSE 1-5 GM/200ML-% IV SOLN
1000.0000 mg | INTRAVENOUS | Status: DC
  Administered 2018-08-18: 1000 mg via INTRAVENOUS
  Filled 2018-08-17 (×3): qty 200

## 2018-08-17 MED ORDER — LATANOPROST 0.005 % OP SOLN
1.0000 [drp] | Freq: Every day | OPHTHALMIC | Status: DC
  Administered 2018-08-17 – 2018-08-29 (×13): 1 [drp] via OPHTHALMIC
  Filled 2018-08-17: qty 2.5

## 2018-08-17 MED ORDER — PROCHLORPERAZINE EDISYLATE 10 MG/2ML IJ SOLN: 5.0000 mg | Freq: Four times a day (QID) | INTRAMUSCULAR | Status: DC | PRN

## 2018-08-17 MED ORDER — ACETAMINOPHEN 325 MG PO TABS
325.0000 mg | ORAL_TABLET | ORAL | Status: DC | PRN
  Administered 2018-08-19 – 2018-08-24 (×2): 650 mg via ORAL
  Filled 2018-08-17 (×3): qty 2

## 2018-08-17 MED ORDER — DIPHENHYDRAMINE HCL 12.5 MG/5ML PO ELIX
12.5000 mg | ORAL_SOLUTION | Freq: Four times a day (QID) | ORAL | Status: DC | PRN
  Administered 2018-08-23: 25 mg via ORAL
  Filled 2018-08-17 (×2): qty 10

## 2018-08-17 MED ORDER — SEVELAMER CARBONATE 800 MG PO TABS
800.0000 mg | ORAL_TABLET | Freq: Three times a day (TID) | ORAL | Status: DC
  Administered 2018-08-18 – 2018-08-30 (×35): 800 mg via ORAL
  Filled 2018-08-17 (×36): qty 1

## 2018-08-17 MED ORDER — METRONIDAZOLE IN NACL 5-0.79 MG/ML-% IV SOLN
500.0000 mg | Freq: Three times a day (TID) | INTRAVENOUS | Status: DC
  Administered 2018-08-17 – 2018-08-22 (×12): 500 mg via INTRAVENOUS
  Filled 2018-08-17 (×15): qty 100

## 2018-08-17 MED ORDER — TIMOLOL MALEATE 0.5 % OP SOLN
1.0000 [drp] | Freq: Two times a day (BID) | OPHTHALMIC | Status: DC
  Administered 2018-08-17 – 2018-08-30 (×26): 1 [drp] via OPHTHALMIC
  Filled 2018-08-17: qty 5

## 2018-08-17 MED ORDER — HEPARIN SODIUM (PORCINE) 1000 UNIT/ML DIALYSIS
20.0000 [IU]/kg | INTRAMUSCULAR | Status: DC | PRN
  Filled 2018-08-17: qty 2

## 2018-08-17 MED ORDER — ONDANSETRON HCL 4 MG/2ML IJ SOLN: 4.0000 mg | Freq: Four times a day (QID) | INTRAMUSCULAR | Status: DC | PRN

## 2018-08-17 MED ORDER — DARBEPOETIN ALFA 150 MCG/0.3ML IJ SOSY
150.0000 ug | PREFILLED_SYRINGE | INTRAMUSCULAR | Status: DC
  Filled 2018-08-17: qty 0.3

## 2018-08-17 MED ORDER — ONDANSETRON HCL 4 MG PO TABS: 4.0000 mg | ORAL_TABLET | Freq: Four times a day (QID) | ORAL | Status: DC | PRN

## 2018-08-17 MED ORDER — ALBUTEROL SULFATE (2.5 MG/3ML) 0.083% IN NEBU
3.0000 mL | INHALATION_SOLUTION | RESPIRATORY_TRACT | Status: DC | PRN
  Administered 2018-08-29: 3 mL via RESPIRATORY_TRACT
  Filled 2018-08-17: qty 3

## 2018-08-17 MED ORDER — ALUM & MAG HYDROXIDE-SIMETH 200-200-20 MG/5ML PO SUSP
30.0000 mL | ORAL | Status: DC | PRN
  Filled 2018-08-17: qty 30

## 2018-08-17 MED ORDER — PROCHLORPERAZINE 25 MG RE SUPP: 12.5000 mg | Freq: Four times a day (QID) | RECTAL | Status: DC | PRN

## 2018-08-17 MED ORDER — POLYETHYLENE GLYCOL 3350 17 G PO PACK: 17.0000 g | PACK | Freq: Every day | ORAL | Status: DC | PRN

## 2018-08-17 MED ORDER — PANTOPRAZOLE SODIUM 40 MG PO TBEC
40.0000 mg | DELAYED_RELEASE_TABLET | Freq: Every day | ORAL | Status: DC
  Administered 2018-08-18 – 2018-08-29 (×12): 40 mg via ORAL
  Filled 2018-08-17 (×12): qty 1

## 2018-08-17 MED ORDER — CHLORHEXIDINE GLUCONATE CLOTH 2 % EX PADS
6.0000 | MEDICATED_PAD | Freq: Every day | CUTANEOUS | Status: DC
  Administered 2018-08-17: 6 via TOPICAL

## 2018-08-17 MED ORDER — FLUTICASONE PROPIONATE 50 MCG/ACT NA SUSP
1.0000 | Freq: Every day | NASAL | Status: DC
  Administered 2018-08-18 – 2018-08-30 (×12): 1 via NASAL
  Filled 2018-08-17 (×2): qty 16

## 2018-08-17 MED ORDER — SILVER SULFADIAZINE 1 % EX CREA
TOPICAL_CREAM | Freq: Every day | CUTANEOUS | Status: DC
  Administered 2018-08-18 – 2018-08-30 (×13): via TOPICAL
  Filled 2018-08-17: qty 85

## 2018-08-17 MED ORDER — SODIUM CHLORIDE 0.9 % IV SOLN
2.0000 g | INTRAVENOUS | Status: DC
  Administered 2018-08-18: 2 g via INTRAVENOUS
  Filled 2018-08-17 (×2): qty 2

## 2018-08-17 MED ORDER — MILK AND MOLASSES ENEMA
1.0000 | Freq: Every day | RECTAL | Status: DC | PRN
  Filled 2018-08-17: qty 240

## 2018-08-17 MED ORDER — OXYCODONE HCL 5 MG PO TABS
5.0000 mg | ORAL_TABLET | Freq: Four times a day (QID) | ORAL | Status: DC | PRN
  Administered 2018-08-17 – 2018-08-29 (×21): 5 mg via ORAL
  Filled 2018-08-17 (×25): qty 1

## 2018-08-17 MED ORDER — METOCLOPRAMIDE HCL 5 MG PO TABS
5.0000 mg | ORAL_TABLET | Freq: Two times a day (BID) | ORAL | Status: DC
  Administered 2018-08-18 – 2018-08-30 (×24): 5 mg via ORAL
  Filled 2018-08-17 (×25): qty 1

## 2018-08-17 MED ORDER — BRIMONIDINE TARTRATE 0.2 % OP SOLN
1.0000 [drp] | Freq: Two times a day (BID) | OPHTHALMIC | Status: DC
  Administered 2018-08-17 – 2018-08-30 (×26): 1 [drp] via OPHTHALMIC
  Filled 2018-08-17: qty 5

## 2018-08-17 MED ORDER — SODIUM CHLORIDE 0.9% FLUSH
3.0000 mL | Freq: Two times a day (BID) | INTRAVENOUS | Status: DC
  Administered 2018-08-17 – 2018-08-30 (×22): 3 mL via INTRAVENOUS

## 2018-08-17 MED ORDER — HEPARIN SODIUM (PORCINE) 5000 UNIT/ML IJ SOLN
5000.0000 [IU] | Freq: Three times a day (TID) | INTRAMUSCULAR | Status: DC
  Administered 2018-08-17 – 2018-08-29 (×31): 5000 [IU] via SUBCUTANEOUS
  Filled 2018-08-17 (×29): qty 1

## 2018-08-17 MED ORDER — PROCHLORPERAZINE MALEATE 5 MG PO TABS: 5.0000 mg | ORAL_TABLET | Freq: Four times a day (QID) | ORAL | Status: DC | PRN

## 2018-08-17 MED ORDER — BISACODYL 10 MG RE SUPP: 10.0000 mg | Freq: Every day | RECTAL | Status: DC | PRN

## 2018-08-17 MED ORDER — DOCUSATE SODIUM 100 MG PO CAPS
100.0000 mg | ORAL_CAPSULE | Freq: Two times a day (BID) | ORAL | Status: DC
  Administered 2018-08-17 – 2018-08-29 (×25): 100 mg via ORAL
  Filled 2018-08-17 (×25): qty 1

## 2018-08-17 NOTE — Progress Notes (Signed)
Michel Santee, PT  Rehab Admission Coordinator  Physical Medicine and Rehabilitation  PMR Pre-admission  Signed  Date of Service:  08/07/2018 11:08 AM       Related encounter: Admission (Discharged) from 08/04/2018 in [facility]      Signed         Show:Clear all [x]Manual[x]Template[]Copied  Added by: [x]Swartz, Celesta Gentile, MD[x]Warren, Earnest Conroy, PT  []Hover for details PMR Admission Coordinator Pre-Admission Assessment  Patient: Howard Bell is an 53 y.o., male MRN: [id number] DOB: [birth year] Height: 6' (182.9 cm) Weight: 94.7 kg  Insurance Information HMO:     PPO:      PCP:      IPA:      80/20:      OTHER:  PRIMARY: Medicare A and B      Policy#: [id number]      Subscriber: patient Pre-Cert#: verified via passport onesource online      Eff. Date: Part A 09/19/2004, Part B 08/20/2005     Deduct: $1408      Out of Pocket Max: n/a      Life Max: n/a CIR: 100%      SNF: 36 days at $176/day Outpatient: 100%     Co-Pay:  Home Health: 100%      Co-Pay:  DME: 80%     Co-Pay: 20% Providers: patient choice  SECONDARY:       Policy#:       Subscriber:  CM Name:       Phone#:      Fax#:  Pre-Cert#:       Employer:  Benefits:  Phone #:      Name:  Eff. Date:      Deduct:       Out of Pocket Max:       Life Max:  CIR:       SNF:  Outpatient:      Co-Pay:  Home Health:       Co-Pay:  DME:      Co-Pay:   Medicaid Application Date:       Case Manager:  Disability Application Date:       Case Worker:   The "Data Collection Information Summary" for patients in Inpatient Rehabilitation Facilities with attached "Privacy Act [place] Records" was provided and verbally reviewed with: Family  Emergency Contact Information         Contact Information    Name Relation Home Work Mobile   Ahlers,[name] Spouse   [phone]      Current Medical History  Patient Admitting Diagnosis: R AKA  History of  Present Illness: Pt is a 53 y/o male with PMH of ESRD (HD MWF), DM type 2, HTN, prior CVA and OSA.  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  Per wife, pt had fallen multiple times at [facility].  F/U with Dr. Sharol Given on 5/12, staples still present, pt not wearing shrinker, new dehiscence of R residual limb, also noted for new ischemic ulceration to posterior aspect of the L ankle and superficial abscess of dorsal lateral aspect of L foot.  Return to [facility] for R BKA to AKA revision on 08/04/2018 with Dr. Sharol Given with wound vac application for 1 week.  Hospital course pain management.  Therapy evaluations completed and recommendations for CIR made.    Pt discharged back to acute care on 5/26 due to ongoing fevers, lethargy, hypoglycemia, and  AMS.  Workup for sepsis due to L gangrene ankle.  Dr. Sharol Given consulted and recommended no abx and no urgent surgical intervention for now.  Pt dialyzed with resolving symptoms.  Pt cleared to readmit to CIR on 5/28.    Patient's medical record from [facility] has been reviewed by the rehabilitation admission coordinator and physician.  Past Medical History      Past Medical History:  Diagnosis Date  . Arthritis   . Congestive heart failure (CHF) ([place])   . Depression   . 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 Diabetic gastroparesis ([place]) 11/06/2017  . ESRD (end stage renal disease) on dialysis ([place])   . H/O seasonal allergies   . Hypertension   . Pneumonia   . Sleep apnea     Family History   family history includes Diabetes in his mother; Hypertension in his father.  Prior Rehab/Hospitalizations Has the patient had prior rehab or hospitalizations prior to admission? Yes, inpatient in Feb 2020, SNF from then until readmission 08/04/18  Has the patient had major surgery during 100 days prior to admission? Yes,  R BKA 04/2018, R AKA 07/2018  Current Medications   Current Facility-Administered Medications:  .  acetaminophen (TYLENOL) tablet 325-650 mg, 325-650 mg, Oral, Q6H PRN, Rayburn, Shawn Montgomery, PA-C .  albuterol (PROVENTIL) (2.5 MG/3ML) 0.083% nebulizer solution 3 mL, 3 mL, Inhalation, Q4H PRN, Rayburn, Shawn Montgomery, PA-C .  brimonidine (ALPHAGAN) 0.2 % ophthalmic solution 1 drop, 1 drop, Both Eyes, BID, Rayburn, Shawn Montgomery, PA-C, 1 drop at 08/06/18 2306 .  calcium acetate (PHOSLO) capsule 2,001 mg, 2,001 mg, Oral, TID WC, Rayburn, Shawn Montgomery, PA-C, 2,001 mg at 08/06/18 1810 .  Chlorhexidine Gluconate Cloth 2 % PADS 6 each, 6 each, Topical, Q0600, Newt Minion, MD .  Darbepoetin Alfa (ARANESP) injection 60 mcg, 60 mcg, Intravenous, Q Mon-HD, Corliss Parish, MD, 60 mcg at 08/07/18 1016 .  docusate sodium (COLACE) capsule 100 mg, 100 mg, Oral, BID, Rayburn, Shawn Montgomery, PA-C, 100 mg at 08/06/18 2305 .  fluticasone (FLONASE) 50 MCG/ACT nasal spray 1 spray, 1 spray, Each Nare, Daily, Rayburn, Shawn Montgomery, PA-C, 1 spray at 08/06/18 1021 .  gabapentin (NEURONTIN) capsule 300 mg, 300 mg, Oral, BID, Newt Minion, MD .  HYDROmorphone (DILAUDID) injection 0.5-1 mg, 0.5-1 mg, Intravenous, Q4H PRN, Rayburn, Shawn Montgomery, PA-C .  insulin aspart (novoLOG) injection 0-5 Units, 0-5 Units, Subcutaneous, QHS, Rayburn, Neta Mends, PA-C, 2 Units at 08/05/18 2319 .  insulin aspart (novoLOG) injection 0-9 Units, 0-9 Units, Subcutaneous, TID WC, Rayburn, Neta Mends, PA-C, 2 Units at 08/06/18 1810 .  lactated ringers infusion, , Intravenous, Continuous, Rayburn, Neta Mends, PA-C, Stopped at 08/04/18 1835 .  latanoprost (XALATAN) 0.005 % ophthalmic solution 1 drop, 1 drop, Both Eyes, QHS, Rayburn, Shawn Montgomery, PA-C, 1 drop at 08/06/18 2307 .  loratadine (CLARITIN) tablet 10 mg, 10 mg, Oral, QPM, Rayburn, Shawn Montgomery, PA-C, 10 mg at 08/06/18 1809 .  metoCLOPramide (REGLAN) tablet 5-10 mg, 5-10 mg, Oral, Q8H  PRN **OR** metoCLOPramide (REGLAN) injection 5-10 mg, 5-10 mg, Intravenous, Q8H PRN, Rayburn, Shawn Montgomery, PA-C .  metoCLOPramide (REGLAN) tablet 5 mg, 5 mg, Oral, TID AC, Rayburn, Shawn Montgomery, PA-C, 5 mg at 08/06/18 1319 .  metoprolol succinate (TOPROL-XL) 24 hr tablet 50 mg, 50 mg, Oral, QHS, Rayburn, Shawn Montgomery, PA-C, 50 mg at 08/06/18 2305 .  mupirocin ointment (BACTROBAN) 2 % 1 application, 1 application, Nasal, BID, Newt Minion,  MD, 1 application at 78/29/56 2305 .  ondansetron (ZOFRAN) tablet 4 mg, 4 mg, Oral, Q6H PRN **OR** ondansetron (ZOFRAN) injection 4 mg, 4 mg, Intravenous, Q6H PRN, Rayburn, Neta Mends, PA-C .  oxyCODONE (Oxy IR/ROXICODONE) immediate release tablet 10-15 mg, 10-15 mg, Oral, Q4H PRN, Rayburn, Neta Mends, PA-C, 15 mg at 08/06/18 2006 .  oxyCODONE (Oxy IR/ROXICODONE) immediate release tablet 5-10 mg, 5-10 mg, Oral, Q4H PRN, Rayburn, Neta Mends, PA-C, 10 mg at 08/05/18 2224 .  pantoprazole (PROTONIX) EC tablet 40 mg, 40 mg, Oral, Daily, Rayburn, Shawn Montgomery, PA-C, 40 mg at 08/06/18 0914 .  sevelamer carbonate (RENVELA) tablet 800 mg, 800 mg, Oral, TID WC, Rayburn, Shawn Montgomery, PA-C, 800 mg at 08/06/18 1809 .  timolol (TIMOPTIC) 0.5 % ophthalmic solution 1 drop, 1 drop, Both Eyes, BID, Rayburn, Shawn Montgomery, PA-C, 1 drop at 08/06/18 2305  Patients Current Diet:     Diet Order                  Diet Carb Modified Fluid consistency: Thin; Room service appropriate? Yes  Diet effective now               Precautions / Restrictions Precautions Precautions: Fall Precaution Comments: wound VAC Restrictions Weight Bearing Restrictions: No   Has the patient had 2 or more falls or a fall with injury in the past year? Yes  Prior Activity Level Community (5-7x/wk): out 3x/week for HD, also went on walks, etc for exercise.  Still driving some  Prior Functional Level Self Care: Did the patient need help  bathing, dressing, using the toilet or eating? Independent  Indoor Mobility: Did the patient need assistance with walking from room to room (with or without device)? Independent  Stairs: Did the patient need assistance with internal or external stairs (with or without device)? Independent  Functional Cognition: Did the patient need help planning regular tasks such as shopping or remembering to take medications? Independent  Home Assistive Devices / Equipment Home Assistive Devices/Equipment: CBG Meter, Oxygen, Wheelchair (transport), tub bench Home Equipment: Walker - 2 wheels  Prior Device Use: Indicate devices/aids used by the patient prior to current illness, exacerbation or injury? None of the above  Current Functional Level Cognition  Overall Cognitive Status: Impaired/Different from baseline Orientation Level: Oriented X4 Following Commands: Follows one step commands inconsistently, Follows one step commands with increased time Safety/Judgement: Decreased awareness of deficits, Decreased awareness of safety    Extremity Assessment (includes Sensation/Coordination)  Upper Extremity Assessment: Overall WFL for tasks assessed  Lower Extremity Assessment: Defer to PT evaluation RLE Deficits / Details: wound VAC in place; pt able to perform hip flexion against gravity x1  RLE: Unable to fully assess due to pain    ADLs  Overall ADL's : Needs assistance/impaired Grooming: Set up, Sitting Upper Body Bathing: Set up, Sitting Lower Body Bathing: Moderate assistance, Sit to/from stand Lower Body Bathing Details (indicate cue type and reason): decreased functional reach to L LE, mod assist sit<>stand  Upper Body Dressing : Set up, Sitting Lower Body Dressing: Moderate assistance, Sit to/from stand Lower Body Dressing Details (indicate cue type and reason): decreased functional reach to L LE, mod assist sit<>stand and reliant on B UE support in standing  Toilet Transfer  Details (indicate cue type and reason): pt declined, sit to stand from recliner with mod assist  Toileting- Clothing Manipulation and Hygiene: Moderate assistance, Sit to/from stand Functional mobility during ADLs: Moderate assistance, Rolling walker, Cueing for safety, Cueing for sequencing  General ADL Comments: pt limited by pain, weakness and impaired balance     Mobility  Overal bed mobility: Needs Assistance Bed Mobility: Supine to Sit Supine to sit: Min guard Sit to supine: Min guard General bed mobility comments: OOB upon entry     Transfers  Overall transfer level: Needs assistance Equipment used: Rolling walker (2 wheeled) Transfers: Sit to/from Stand Sit to Stand: Mod assist Stand pivot transfers: Min assist General transfer comment: mod assist to ascend from recliner with cueing for hand placement and safety     Ambulation / Gait / Stairs / Wheelchair Mobility  Ambulation/Gait General Gait Details: pt able to take a few hops on L LE with RW during pivot to chair    Posture / Balance Balance Overall balance assessment: Needs assistance Sitting-balance support: No upper extremity supported Sitting balance-Leahy Scale: Fair Standing balance support: Bilateral upper extremity supported, During functional activity Standing balance-Leahy Scale: Poor Standing balance comment: relaint on B UE support    Special needs/care consideration BiPAP/CPAP has a CPAP, SNF says it stopped working  CPM no Continuous Drip IV no Dialysis yes        Days MWF Life Vest  no Oxygen no Special Bed no Trach Size no Wound Vac (area) yes      Location R AKA Skin incision to R AKA, wounds to L foot (dorsal aspect), and L ankle Bowel mgmt: continent Bladder mgmt: anuric Diabetic mgmt: yes Behavioral consideration no Chemo/radiation no   Previous Home Environment (from acute therapy documentation) Living Arrangements: Spouse/significant other Available Help at Discharge: Family,  Available PRN/intermittently Type of Home: Apartment Home Layout: One level Home Access: Stairs to enter Technical brewer of Steps: 14 Bathroom Shower/Tub: Chiropodist: Standard Home Care Services: No  Discharge Living Setting Plans for Discharge Living Setting: Patient's home, Apartment Type of Home at Discharge: Apartment Discharge Home Layout: Other (Comment)(currently 2nd floor, wife confirmed will have 1st floor in the next 2 weeks) Discharge Home Access: Level entry Discharge Bathroom Shower/Tub: Tub/shower unit Discharge Bathroom Toilet: Standard Discharge Bathroom Accessibility: Yes How Accessible: Accessible via walker Does the patient have any problems obtaining your medications?: No  Social/Family/Support Systems Patient Roles: Spouse, Parent Anticipated Caregiver: spouse, Kenney Houseman Anticipated Caregiver's Contact Information: [phone] Ability/Limitations of Caregiver: not currently working 2/2 COVID, also has a 51 y/o daughter who is out of school currently Caregiver Availability: 24/7 Discharge Plan Discussed with Primary Caregiver: Yes Is Caregiver In Agreement with Plan?: Yes Does Caregiver/Family have Issues with Lodging/Transportation while Pt is in Rehab?: No  Goals/Additional Needs Patient/Family Goal for Rehab: PT/OT supervision to mod I Expected length of stay: 12-14 days Cultural Considerations: n/a Dietary Needs: carb modified, thin Equipment Needs: tbd Special Service Needs: HD MWF Pt/Family Agrees to Admission and willing to participate: Yes Program Orientation Provided & Reviewed with Pt/Caregiver Including Roles  & Responsibilities: Yes   Possible need for SNF placement upon discharge: no  Patient Condition: I have reviewed medical records from [facility], spoken with CSW, and patient and spouse. I met with patient at the bedside and discussed with wife via phone for inpatient rehabilitation assessment.   Patient will benefit from ongoing PT and OT, can actively participate in 3 hours of therapy a day 5 days of the week, and can make measurable gains during the admission.  Patient will also benefit from the coordinated team approach during an Inpatient Acute Rehabilitation admission.  The patient will receive intensive therapy as well as Rehabilitation physician,  nursing, Education officer, museum, and care management interventions.  Due to safety, skin/wound care, disease management, medication administration, pain management and patient education the patient requires 24 hour a day rehabilitation nursing.  The patient is currently min/mod with mobility and basic ADLs.  Discharge setting and therapy post discharge at home with home health is anticipated.  Patient has agreed to participate in the Acute Inpatient Rehabilitation Program and will admit today.  Preadmission Screen Completed By:  Michel Santee, PT, DPT 08/07/2018 11:08 AM ______________________________________________________________________   Discussed status with Dr. Naaman Plummer on 08/07/18  at 11:33 AM  and received approval for admission today.  Admission Coordinator:  Michel Santee, PT, DPT time 11:33 AM Sudie Grumbling 08/07/18    Assessment/Plan: Diagnosis: Right AKA 1. Does the need for close, 24 hr/day Medical supervision in concert with the patient's rehab needs make it unreasonable for this patient to be served in a less intensive setting? Yes 2. Co-Morbidities requiring supervision/potential complications: ESRD, DM2, HTN, OSA, PVD 3. Due to bladder management, bowel management, safety, skin/wound care, disease management, medication administration, pain management and patient education, does the patient require 24 hr/day rehab nursing? Yes 4. Does the patient require coordinated care of a physician, rehab nurse, PT (1-2 hrs/day, 5 days/week) and OT (1-2 hrs/day, 5 days/week) to address physical and functional deficits in the context of the above  medical diagnosis(es)? Yes Addressing deficits in the following areas: balance, endurance, locomotion, strength, transferring, bowel/bladder control, bathing, dressing, feeding, grooming, toileting and psychosocial support 5. Can the patient actively participate in an intensive therapy program of at least 3 hrs of therapy 5 days a week? Yes 6. The potential for patient to make measurable gains while on inpatient rehab is excellent 7. Anticipated functional outcomes upon discharge from inpatients are: modified independent and supervision PT, modified independent and supervision OT, n/a SLP 8. Estimated rehab length of stay to reach the above functional goals is: 12-14 days 9. Anticipated D/C setting: Home 10. Anticipated post D/C treatments: [place] therapy 11. Overall Rehab/Functional Prognosis: excellent  MD Signature: Meredith Staggers, MD, [facility] 08/07/2018         Cosigned by: Meredith Staggers, MD at 08/07/2018 5:53 PM  Revision History     Shann Medal, PT, DPT Admissions Coordinator [phone] 08/17/18  11:01 AM

## 2018-08-17 NOTE — Progress Notes (Signed)
Occupational Therapy Evaluation Patient Details Name: Howard Bell MRN: [id number] DOB: Nov 14, 1965 Today's Date: 08/17/2018    History of Present Illness Pt is a 53 y/o male s/p R transfemoral amputation. 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   Clinical Impression   PTA, pt was at CIR and completed LB bathing and dressing at bed level with modA and required minA-modA when transferring. Pt currently requires modA for LB ADL and minA for UB ALD and modA for functional mobility at RW level. Pt scored 1/10 on Medi-Cog (Mini-Cog + Med Transfer) this session. Pt demonstrates cognitive limitations and physical limitation listed below (see OT problem list) that limit his independence and safety with ADL and functional mobility. Due to decline in current level of function, pt would benefit from continued OT services to address established goals to facilitate safe D/C to venue listed below. At this time, recommend CIR follow-up. Per nsg, pt to d/c to CIR later this date. Will continue to follow acutely if pt remains in acute care.     Follow Up Recommendations  CIR    Equipment Recommendations  3 in 1 bedside commode    Recommendations for Other Services       Precautions / Restrictions Precautions Precautions: Fall Precaution Comments: . Right AKA. 2L oxygen Restrictions Weight Bearing Restrictions: Yes RLE Weight Bearing: Non weight bearing      Mobility Bed Mobility Overal bed mobility: Needs Assistance Bed Mobility: Supine to Sit     Supine to sit: Min assist     General bed mobility comments: minA 1 hand support for pt to progress upright  Transfers Overall transfer level: Needs assistance Equipment used: Rolling walker (2 wheeled) Transfers: Sit to/from Stand Sit to Stand: Mod assist Stand pivot transfers: Mod assist       General transfer comment: mod assist to ascend from [place] for hand  placement and safety     Balance Overall balance assessment: Needs assistance Sitting-balance support: No upper extremity supported Sitting balance-Leahy Scale: Fair     Standing balance support: Bilateral upper extremity supported;During functional activity Standing balance-Leahy Scale: Poor Standing balance comment: relaint on B UE support                           ADL either performed or assessed with clinical judgement   ADL Overall ADL's : Needs assistance/impaired     Grooming: Set up;Sitting   Upper Body Bathing: Set up;Sitting   Lower Body Bathing: Moderate assistance;Sit to/from stand Lower Body Bathing Details (indicate cue type and reason): decreased functional reach to L LE, mod assist sit<>stand  Upper Body Dressing : Set up;Sitting   Lower Body Dressing: Moderate assistance;Sit to/from stand Lower Body Dressing Details (indicate cue type and reason): decreased functional reach to L LE, mod assist sit<>stand and reliant on B UE support in standing  Toilet Transfer: Moderate assistance;RW;Stand-pivot Toilet Transfer Details (indicate cue type and reason): pt required modA to powerup from EOB;and modA for tability with stand pivot         Functional mobility during ADLs: Moderate assistance;Rolling walker;Cueing for safety;Cueing for sequencing General ADL Comments: pt reported no dizziness with movement     Vision Baseline Vision/History: Wears glasses Wears Glasses: At all times Patient Visual Report: Eye fatigue/eye pain/headache;Other (comment)(difficutly with convergence and divergence) Vision Assessment?: Yes Eye Alignment: Within Functional Limits Ocular Range of Motion: Restricted on the right  Alignment/Gaze Preference: Within Defined Limits Tracking/Visual Pursuits: Decreased smoothness of horizontal tracking;Decreased smoothness of vertical tracking;Requires cues, head turns, or add eye shifts to track;Unable to hold eye position out of  midline;Decreased smoothness of eye movement to RIGHT superior field Saccades: Additional eye shifts occurred during testing;Decreased speed of saccadic movement Convergence: Impaired (comment)(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) Visual Fields: No apparent deficits Additional Comments: pt reports increased difficulty with adjusting to items close to him and looking back at the TV;pt reports this is different from baseline even with glasses     Perception     Praxis      Pertinent Vitals/Pain Pain Assessment: Faces Faces Pain Scale: Hurts a little bit Pain Location: R residual limb Pain Descriptors / Indicators: Sore Pain Intervention(s): Limited activity within patient's tolerance;Monitored during session     Hand Dominance Right   Extremity/Trunk Assessment Upper Extremity Assessment Upper Extremity Assessment: Generalized weakness;LUE deficits/detail RUE Deficits / Details: to be further assessed  LUE Deficits / Details: unable to fully extend all digits;to be further assessed   Lower Extremity Assessment Lower Extremity Assessment: Defer to PT evaluation   Cervical / Trunk Assessment Cervical / Trunk Assessment: Normal   Communication Communication Communication: No difficulties   Cognition Arousal/Alertness: Lethargic Behavior During Therapy: Flat affect Overall Cognitive Status: Impaired/Different from baseline Area of Impairment: Memory;Following commands;Safety/judgement;Awareness;Problem solving                     Memory: Decreased short-term memory;Decreased recall of precautions Following Commands: Follows one step commands inconsistently;Follows one step commands with increased time Safety/Judgement: Decreased awareness of deficits;Decreased awareness of safety Awareness: Intellectual Problem Solving: Slow processing;Decreased initiation;Difficulty sequencing;Requires verbal cues General Comments: pt scored 1/10 on MEDI-COG screening: able to  recall 1 work from version 1 three word recognition:drew the clock with misaligned numbers (11 where 9 is and 7 in position of six and 4 in position of 3, leaving large space between 11 and 12;all numbers were accounted for;hands pointed at 10 and 2;,medication transfer screen;pt unable to correctly place pills in correct spot;required extended time;unable to fill in correct pill boxes with OT cues during review of assessment;pt repeatedly asked what he was supposed to be doing   General Comments  upon arrival pt was on RA SpO2 84%-87%;provided 2lnc, SpO2 99%    Exercises     Shoulder Instructions      Home Living Family/patient expects to be discharged to:: Inpatient rehab Living Arrangements: Spouse/significant other;Children Available Help at Discharge: Family;Available 24 hours/day(wife) Type of Home: Apartment(2nd floor apt. States he has plans to move to a 1st floor apt.) Home Access: Stairs to enter Technical brewer of Steps: 14 Entrance Stairs-Rails: Right Home Layout: One level     Bathroom Shower/Tub: Teacher, early years/pre: Standard     Home Equipment: Environmental consultant - 2 wheels      Lives With: Spouse    Prior Functioning/Environment Level of Independence: Needs assistance  Gait / Transfers Assistance Needed: can get in/out of w/c by himself; ambulates short distances with RW; able to propel himself when in w/c ADL's / Homemaking Assistance Needed: requires setup assist for ADLs   Comments:  dialysis MWF        OT Problem List: Decreased activity tolerance;Impaired balance (sitting and/or standing);Decreased coordination;Decreased cognition;Decreased safety awareness;Decreased knowledge of precautions;Decreased knowledge of use of DME or AE;Pain      OT Treatment/Interventions: Self-care/ADL training;DME and/or AE instruction;Therapeutic activities;Patient/family education;Balance training;Cognitive remediation/compensation;Therapeutic exercise  OT  Goals(Current goals can be found in the care plan section) Acute Rehab OT Goals Patient Stated Goal: "to go home with my wife" OT Goal Formulation: With patient Time For Goal Achievement: 08/19/18 Potential to Achieve Goals: Good ADL Goals Pt Will Perform Lower Body Dressing: with adaptive equipment;with modified independence Pt Will Transfer to Toilet: with supervision;stand pivot transfer;bedside commode Additional ADL Goal #1: Pt will complete medication management with modified independence.  OT Frequency: Min 2X/week   Barriers to D/C:            Co-evaluation              AM-PAC OT "6 Clicks" Daily Activity     Outcome Measure Help from another person eating meals?: None Help from another person taking care of personal grooming?: A Little Help from another person toileting, which includes using toliet, bedpan, or urinal?: A Little Help from another person bathing (including washing, rinsing, drying)?: A Lot Help from another person to put on and taking off regular upper body clothing?: None Help from another person to put on and taking off regular lower body clothing?: A Lot 6 Click Score: 18   End of Session Equipment Utilized During Treatment: Gait belt;Rolling walker;Oxygen(2lnc) Nurse Communication: Mobility status  Activity Tolerance: Patient tolerated treatment well Patient left: in chair;with call bell/phone within reach;with chair alarm set;Other (comment)(with MD present)  OT Visit Diagnosis: Other abnormalities of gait and mobility (R26.89);Pain Pain - Right/Left: Right Pain - part of body: (AKA)                Time: 7867-6720 OT Time Calculation (min): 47 min Charges:  OT General Charges $OT Visit: 1 Visit OT Evaluation $OT Eval Moderate Complexity: 1 Mod OT Treatments $Self Care/Home Management : 8-22 mins $Cognitive Funtion inital: Initial 15 mins  Dorinda Hill OTR/L Acute Rehabilitation Services Office: [phone]   Wyn Forster 08/17/2018, 10:52 AM

## 2018-08-17 NOTE — Progress Notes (Signed)
Physical Therapy Evaluation Patient Details Name: Howard Bell MRN: [id number] DOB: 24-Dec-1965 Today's Date: 08/17/2018   History of Present Illness  Pt is a 53 y/o male s/p R transfemoral amputation. 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    Clinical Impression  Pt admitted with above diagnosis. Pt currently with functional limitations due to the deficits listed below (see PT Problem List). PTA pt was ambulatory with RW for short distances. Today, just returned to bed from chair all day by RN, requesting bed level eval. Able to stand pivot today with OT, focused on therex , education of role of post acute rehab.  Pt will benefit from skilled PT to increase their independence and safety with mobility to allow discharge to the venue listed below.       Follow Up Recommendations CIR    Equipment Recommendations  Rolling walker with 5" wheels;Wheelchair (measurements PT);Wheelchair cushion (measurements PT)    Recommendations for Other Services       Precautions / Restrictions Precautions Precautions: Fall Precaution Comments: . Right AKA. 2L oxygen Restrictions Weight Bearing Restrictions: Yes RLE Weight Bearing: Non weight bearing      Mobility  Bed Mobility Overal bed mobility: Needs Assistance Bed Mobility: Supine to Sit     Supine to sit: Min assist     General bed mobility comments: minA 1 hand support for pt to progress upright  Transfers Overall transfer level: Needs assistance Equipment used: Rolling walker (2 wheeled) Transfers: Sit to/from Stand Sit to Stand: Mod assist Stand pivot transfers: Mod assist       General transfer comment: mod assist to ascend from [place] for hand placement and safety   Ambulation/Gait                Stairs            Wheelchair Mobility    Modified Rankin (Stroke Patients Only)       Balance Overall balance assessment: Needs  assistance Sitting-balance support: No upper extremity supported Sitting balance-Leahy Scale: Fair                                       Pertinent Vitals/Pain Faces Pain Scale: Hurts a little bit Pain Location: R residual limb Pain Descriptors / Indicators: Sore    Home Living Family/patient expects to be discharged to:: Inpatient rehab Living Arrangements: Spouse/significant other;Children Available Help at Discharge: Family;Available 24 hours/day(wife) Type of Home: Apartment(2nd floor apt. States he has plans to move to a 1st floor apt.) Home Access: Stairs to enter Entrance Stairs-Rails: Right Entrance Stairs-Number of Steps: 14 Home Layout: One level Home Equipment: Walker - 2 wheels      Prior Function Level of Independence: Needs assistance   Gait / Transfers Assistance Needed: can get in/out of w/c by himself; ambulates short distances with RW; able to propel himself when in w/c  ADL's / Homemaking Assistance Needed: requires setup assist for ADLs  Comments: was at SNF since March 2 after first amputation; dialysis MWF     Hand Dominance   Dominant Hand: Right    Extremity/Trunk Assessment   Upper Extremity Assessment RUE Deficits / Details: to be further assessed  LUE Deficits / Details: unable to fully extend all digits;to be further assessed    Lower Extremity Assessment Lower Extremity Assessment: (s/p R AKA) RLE: Unable to  fully assess due to pain    Cervical / Trunk Assessment Cervical / Trunk Assessment: Normal  Communication   Communication: No difficulties  Cognition Arousal/Alertness: Lethargic Behavior During Therapy: Flat affect Overall Cognitive Status: Impaired/Different from baseline Area of Impairment: Memory;Following commands;Safety/judgement;Awareness;Problem solving                     Memory: Decreased short-term memory;Decreased recall of precautions Following Commands: Follows one step commands  inconsistently;Follows one step commands with increased time Safety/Judgement: Decreased awareness of deficits;Decreased awareness of safety Awareness: Intellectual Problem Solving: Slow processing;Decreased initiation;Difficulty sequencing;Requires verbal cues        General Comments      Exercises Amputee Exercises Quad Sets: 20 reps Hip Extension: 15 reps Hip ABduction/ADduction: 15 reps Hip Flexion/Marching: 20 reps Straight Leg Raises: 15 reps   Assessment/Plan    PT Assessment Patient needs continued PT services  PT Problem List Decreased strength;Decreased balance;Decreased mobility;Decreased range of motion;Decreased activity tolerance;Decreased coordination;Decreased cognition;Decreased knowledge of use of DME;Decreased knowledge of precautions;Decreased safety awareness;Pain       PT Treatment Interventions DME instruction;Gait training;Stair training;Functional mobility training;Therapeutic exercise;Balance training;Therapeutic activities;Neuromuscular re-education;Cognitive remediation;Patient/family education    PT Goals (Current goals can be found in the Care Plan section)  Acute Rehab PT Goals Patient Stated Goal: to go to rehab PT Goal Formulation: With patient Time For Goal Achievement: 08/18/18    Frequency Min 5X/week   Barriers to discharge        Co-evaluation               AM-PAC PT "6 Clicks" Mobility  Outcome Measure Help needed turning from your back to your side while in a flat bed without using bedrails?: A Little Help needed moving from lying on your back to sitting on the side of a flat bed without using bedrails?: A Little Help needed moving to and from a bed to a chair (including a wheelchair)?: A Lot Help needed standing up from a chair using your arms (e.g., wheelchair or bedside chair)?: A Lot Help needed to walk in hospital room?: A Lot Help needed climbing 3-5 steps with a railing? : Total 6 Click Score: 13    End of  Session Equipment Utilized During Treatment: Gait belt Activity Tolerance: Patient limited by lethargy;Patient limited by pain Patient left: in chair;with call bell/phone within reach;with chair alarm set Nurse Communication: Mobility status PT Visit Diagnosis: Other abnormalities of gait and mobility (R26.89)    Time: 1540-1605 PT Time Calculation (min) (ACUTE ONLY): 25 min   Charges:   PT Evaluation $PT Eval Low Complexity: 1 Low PT Treatments $Therapeutic Exercise: 8-22 mins      Reinaldo Berber, PT, DPT Acute Rehabilitation Services Pager: [phone] Office: [phone]    Reinaldo Berber 08/17/2018, 4:11 PM

## 2018-08-17 NOTE — Progress Notes (Signed)
Inpatient Rehab Admissions Coordinator:    Pt to readmit to CIR today.  Shann Medal, PT, DPT Admissions Coordinator [phone] 08/17/18  10:52 AM

## 2018-08-17 NOTE — Progress Notes (Signed)
Pharmacy Antibiotic Note  Howard Bell is a 53 y.o. 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 The patient was transferred to [facility] 5/28. Pharmacy consulted to resume Vancomycin and Cefepime dosing.   The patient has been on Vancomycin + Cefepime since 5/26 and is ESRD-MWF.  The patient last received a dose of Vancomycin after HD on 5/27, next dose due with HD on 5/29.  Plan: - Continue Vancomycin 1g/HD-MWF - Continue Cefepime 2g on MWF @ 2000 - Will continue to follow HD schedule/duration, culture results, LOT, and antibiotic de-escalation plans      Temp (24hrs), Avg:98.6 F (37 C), Min:97.6 F (36.4 C), Max:99 F (37.2 C)  Recent Labs  Lab 08/11/18 1330 08/14/18 2356 08/15/18 1525 08/15/18 1608 08/15/18 2101 08/16/18 0416 08/17/18 0416  WBC 9.9 8.4 9.9  --   --  10.2 10.7*  CREATININE 11.06* 13.65*  --  8.40*  --  9.47* 6.36*  LATICACIDVEN  --   --   --  1.5 1.0  --   --     Estimated Creatinine Clearance: 15.1 mL/min (A) (by C-G formula based on SCr of 6.36 mg/dL (H)).    No Known Allergies  Antimicrobials this admission: Doxy 5/19 >>5/26 (at CIR) Vanco 5/26 >> Cefepime 5/26 >> Flagyl IV 5/27>>  Microbiology results: 5/15: COVID neg  Thank you for allowing pharmacy to be a part of this patient's care.  Alycia Rossetti, PharmD, BCPS Clinical Pharmacist Clinical phone for 08/17/2018: [phone] 08/17/2018 5:50 PM   **Pharmacist phone directory can now be found on [email or web address] ([email or web address]).  Listed under [place].

## 2018-08-17 NOTE — Progress Notes (Signed)
Patient ID: Howard Bell, male   DOB: 01/14/1966, 53 y.o.   MRN: [id number] Patient arrived via hospital bed from [id number] with patient belongings. This is an interrupted stay so patient was reoriented to rehab. Patient had meal try and insulin coverage on 77M. Patient reoriented to nurse call system, fall prevention plan, safety plan, and schedule. Patient resting in bed with bed alarm on, call bed at side, and no complaints of pain.

## 2018-08-17 NOTE — Progress Notes (Addendum)
[facility] Progress Note   Subjective: Seen in room. Not confused today. Denies CP/dyspnea. Transferring back to CIR today.    Objective Vitals:   08/16/18 1649 08/16/18 1939 08/17/18 0407 08/17/18 0847  BP: (!) 108/50 (!) 122/39 (!) 122/46 (!) 126/38  Pulse: 89 93 79 82  Resp: 18 18 18 18   Temp: 98.4 F (36.9 C) 99 F (37.2 C) 97.6 F (36.4 C) 99 F (37.2 C)  TempSrc: Oral Oral Oral Oral  SpO2: 100% 99% 95% 92%  Weight:   91.2 kg   Height:       Physical Exam General: Chronically ill appearing man, NAD. Aware/alert. Heart: RRR; 2/6 murmur Lungs: CTAB Abdomen: soft Extremities: R AKA bandaged; LLE without edema; scattered wounds - large wound bandaged/not examined (see photos under "media") Dialysis Access: LUE AVF + thrill  Additional Objective Labs: Basic Metabolic Panel: Recent Labs  Lab 08/11/18 1330  08/14/18 2356 08/15/18 1608 08/16/18 0416 08/17/18 0416  NA 133*  --  129* 131* 133* 130*  K 4.6  --  4.5 4.3 4.4 4.0  CL 92*  --  89* 93* 92* 94*  CO2 25  --  24 26 25 24   GLUCOSE 100*   < > 136* 163* 108* 168*  BUN 69*  --  103* 50* 64* 33*  CREATININE 11.06*  --  13.65* 8.40* 9.47* 6.36*  CALCIUM 9.3  --  9.3 8.5* 9.2 8.8*  PHOS 4.2  --  3.8  --   --   --    < > = values in this interval not displayed.   Liver Function Tests: Recent Labs  Lab 08/14/18 2356 08/15/18 1608 08/16/18 0416  AST  --  18 17  ALT  --  8 8  ALKPHOS  --  118 112  BILITOT  --  0.4 0.6  PROT  --  7.5 7.7  ALBUMIN 2.2* 2.3* 2.2*   CBC: Recent Labs  Lab 08/11/18 1330 08/14/18 2356 08/15/18 1525 08/16/18 0416 08/17/18 0416  WBC 9.9 8.4 9.9 10.2 10.7*  NEUTROABS  --   --  7.0  --   --   HGB 9.1* 8.4* 8.5* 8.7* 8.5*  HCT 29.3* 26.5* 27.2* 27.7* 26.9*  MCV 93.9 91.1 91.0 90.8 91.2  PLT 226 214 194 209 220   Blood Culture    Component Value Date/Time   SDES BLOOD LEFT ANTECUBITAL 08/15/2018 1526   SPECREQUEST  08/15/2018 1526    BOTTLES DRAWN  AEROBIC ONLY Blood Culture adequate volume   CULT  08/15/2018 1526    NO GROWTH < 24 HOURS Performed at [facility] [address], [place], [place] [address]    REPTSTATUS PENDING 08/15/2018 1526   Iron Studies:  Recent Labs    08/15/18 1608  FERRITIN 1,085*   Studies/Results: Dg Chest 2 View  Result Date: 08/15/2018 CLINICAL DATA:  Fever. Left foot infection. History of diabetes, end-stage renal disease and pneumonia. EXAM: CHEST - 2 VIEW COMPARISON:  Radiographs 05/12/2018 and 05/02/2018. CT 05/02/2018 and 11/09/2017. FINDINGS: The heart size and mediastinal contours are stable. The patient has known chronic adenopathy in the right paratracheal and subcarinal regions which appears stable. Vascular stents are noted in the left subclavian and axillary vessels. Patchy right perihilar and lower lobe airspace opacities appear largely chronic, not significantly changed over the recent prior studies. The left lung is clear. There is no pleural effusion or pneumothorax. No acute osseous findings. IMPRESSION: No significant change in the appearance  of the chest compared with prior studies obtained over the last 8 months. The right basilar airspace opacity appears chronic and may reflect postinflammatory scarring or chronic atypical inflammation. Grossly stable mediastinal adenopathy. Electronically Signed   By: Richardean Sale M.D.   On: 08/15/2018 16:13   Ct Head Wo Contrast  Result Date: 08/15/2018 CLINICAL DATA:  Altered mental status EXAM: CT HEAD WITHOUT CONTRAST TECHNIQUE: Contiguous axial images were obtained from the base of the skull through the vertex without intravenous contrast. COMPARISON:  Head CT 11/06/2017 FINDINGS: Brain: No acute intracranial hemorrhage. Unchanged appearance of right parietal, left occipital and right cerebellar infarcts. No midline shift or other mass effect. Bilateral basal ganglia mineralization. Vascular: No abnormal hyperdensity of the major intracranial  arteries or dural venous sinuses. No intracranial atherosclerosis. Skull: The visualized skull base, calvarium and extracranial soft tissues are normal. Sinuses/Orbits: No fluid levels or advanced mucosal thickening of the visualized paranasal sinuses. No mastoid or middle ear effusion. The orbits are normal. IMPRESSION: 1. No acute abnormality. 2. Unchanged appearance of left occipital, right parietal and right cerebellar infarcts. Electronically Signed   By: Ulyses Jarred M.D.   On: 08/15/2018 21:48   Dg Foot Complete Left  Result Date: 08/15/2018 CLINICAL DATA:  Fever. Left foot infection. History of diabetes, end-stage renal disease and pneumonia. EXAM: LEFT FOOT - COMPLETE 3+ VIEW COMPARISON:  None. FINDINGS: There is no evidence of acute fracture or dislocation. There are possible old healed fractures of the 4th and 5th metatarsal necks. There are minimal degenerative changes at the 1st metatarsophalangeal joint. No erosive changes or bone destruction identified. There are prominent vascular calcifications consistent with diabetes. The soft tissues are diffusely prominent without focal swelling or foreign body. IMPRESSION: No acute osseous findings or evidence of osteomyelitis. Prominent vascular calcifications. Electronically Signed   By: Richardean Sale M.D.   On: 08/15/2018 16:15   Medications: . ceFEPime (MAXIPIME) IV 2 g (08/16/18 2000)  . metronidazole 500 mg (08/17/18 0845)  . vancomycin Stopped (08/16/18 1650)   . brimonidine  1 drop Both Eyes BID  . calcium acetate  2,001 mg Oral TID WC  . [START ON 08/21/2018] darbepoetin (ARANESP) injection - DIALYSIS  150 mcg Intravenous Q Mon-HD  . docusate sodium  100 mg Oral BID  . fluticasone  1 spray Each Nare Daily  . heparin  5,000 Units Subcutaneous Q8H  . insulin aspart  0-9 Units Subcutaneous Q4H  . latanoprost  1 drop Both Eyes QHS  . metoCLOPramide  5 mg Oral TID AC  . metoprolol succinate  50 mg Oral QHS  . pantoprazole  40 mg Oral  Daily  . sevelamer carbonate  800 mg Oral TID WC  . sodium chloride flush  3 mL Intravenous Q12H  . timolol  1 drop Both Eyes BID    Dialysis Orders: SW MWF 4h 72min 450/2x 95.5kg 2/2.25 bath P2 Hep 9000 L AVF - Parsabiv 10mg  TIW - Mircera 150 mcg IV q 2 weeks (last 5/4)  Assessment/Plan: 1. 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 Sharol Given. 2. LLE gangrene (new): Concern that was infected; BCx drawn 5/26 negative. Started on Vanc/Cefepime. Dr. Sharol Given consulted and has recommended L AKA in the future - no urgent need, not felt to be infected. 3. ESRD: Continue HD per MWF schedule- next 5/29. 4. Hypertension/volume: BPstable -  will need to lower EDW on discharge. 5. Anemia: Hgb8.5.Continue Aranesp121mcg q Monday. IV iron held. 6. Metabolic bone disease: Ca/Phos ok.Continue Ca  acetate + Renvela as binder. Parsabiv not available in hospital. Follow labs.  7. T2DM:Insulin per primary. Hypoglycemia overnight 5/25 - glucotrol stopped. 8. Twitchiness: Could be reglan EPS vs gabapentin myoclonus, follow. 9.  AMS (5/26 - 5/27): Hypoglycemia v. meds v. delirium v. L heel gangrene - improved.  Veneta Penton, PA-C 08/17/2018, 11:26 AM  [facility] Pager: [phone]  Pt seen, examined and agree w A/P as above.  Kelly Splinter  MD 08/17/2018, 12:54 PM

## 2018-08-17 NOTE — Progress Notes (Signed)
53 year old male with history of CHF hypertension type 2 diabetes end-stage renal disease and peripheral artery disease status post right BKA who was admitted with a wound dehiscence as well as left foot abscess with ulceration.  He underwent right AKA 515.  Admitted CIR 08/07/2018.  Patient had problems with hypoglycemia he had increasing lethargy, blood cultures x2 drawn, patient felt to have gangrene and would require left AKA in the future but no need for antibiotics at this time.  Remains afebrile.  Patient ready to resume his rehab stay.   General: No acute distress Mood and affect are appropriate Heart: Regular rate and rhythm no rubs murmurs or extra sounds Lungs: Clear to auscultation, breathing unlabored, no rales or wheezes Abdomen: Positive bowel sounds, soft nontender to palpation, nondistended Extremities: No clubbing, cyanosis, or edema Skin: Dry gangrene over left Achilles area    Neurologic: Cranial nerves II through XII intact, motor strength is 5/5 in bilateral deltoid, bicep, tricep,, right grip, left grip is 4- due to contractures in fourth and fifth digits Ruthell Rummage is reduced in the left fourth and fifth digits also reduced left foot.  She has contracture at left ankle.  Limited left toe movements. Cerebellar exam normal finger to nose to finger Musculoskeletal: Full range of motion in all 4 extremities. No joint swelling   Assessment 1 right AKA ready to resume rehabilitation in a.m.  2.  End-stage renal disease on hemodialysis managed by nephrology  3.  Hypertension we will monitor with routine vitals, continue metoprolol  4.  Type 2 diabetes with gastroparesis

## 2018-08-17 NOTE — Progress Notes (Signed)
Related encounter: Admission (Current) from 08/15/2018 in [facility] [place], [place], [place]  Rehab Admission Coordinator  Physical Medicine and Rehabilitation  PMR Pre-admission   Signed   Date of Service:  08/07/2018 11:08 AM          Related encounter: Admission (Discharged) from 08/04/2018 in [place]            Signed          Show:Clear all [x] ?Manual[x] ?Template[] ?Copied  Added by: [x] ?Meredith Staggers, MD[x] ?Michel Santee, PT  [] ?Hover for details PMR Admission Coordinator Pre-Admission Assessment  Patient:Howard Bell an 53 y.o.,male AYT:[id number] DOB:1965-12-12 Height:6' (182.9 cm) Weight:94.7 kg  Insurance Information HMO: PPO: PCP: IPA: 80/20: OTHER:  PRIMARY:Medicare A and BPolicy#: [id number]: patient Pre-Cert#:verified via passport onesource online Eff. Date:Part A 09/19/2004, Part B 06/01/2007Deduct: $1408Out of Pocket Max: n/aLife Max: n/a CIR:100%SNF: 36 days at $176/day Outpatient:100%Co-Pay:  Home Health:100%Co-Pay:  DME:80%Co-Pay: 20% Providers:patient choice  SECONDARY: Policy#: Subscriber:  CM Name: Phone#: Fax#:  Pre-Cert#: Employer:  Benefits: Phone #: Name:  Eff. Date: Deduct: Out of Pocket Max: Life Max:  CIR: SNF:  Outpatient: Co-Pay:  Home Health: Co-Pay:  DME: Co-Pay:   Medicaid Application Date: Case Manager:  Disability Application Date: Case Worker:   The "Data Collection Information Summary" for patients in Inpatient Rehabilitation Facilities with attached "Privacy Act [place] Records" was provided and verbally reviewed with:Family  Emergency Contact Information         Contact Information   Name  Relation Home Work Mobile    Howard Bell,Howard Bell Spouse   [phone]       Current Medical History Patient Admitting Diagnosis:R AKA  History of Present Illness:Pt is a 53 y/o male with PMH of ESRD (HD MWF), DM type 2, HTN, prior CVA and OSA. 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 Per wife, pt had fallen multiple times at [facility]. F/U with Dr. Sharol Given on 5/12, staples still present, pt not wearing shrinker, new dehiscence of R residual limb, also noted for new ischemic ulceration to posterior aspect of the L ankle and superficial abscess of dorsal lateral aspect of L foot. Return to [facility] for R BKA to AKA revision on 08/04/2018 with Dr. Sharol Given with wound vac application for 1 week. Hospital course pain management. Therapy evaluations completed and recommendations for CIR made.   Pt discharged back to acute care on 5/26 due to ongoing fevers, lethargy, hypoglycemia, and AMS.  Workup for sepsis due to L gangrene ankle.  Dr. Sharol Given consulted and recommended no abx and no urgent surgical intervention for now.  Pt dialyzed with resolving symptoms.  Pt cleared to readmit to CIR on 5/28.    [facility]  Past Medical History     Past Medical History:  Diagnosis Date  . Arthritis   . Congestive heart failure (CHF) ([place])   . Depression   . 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 Diabetic gastroparesis ([place]) 11/06/2017  . ESRD (end stage renal disease) on dialysis ([place])   . H/O seasonal allergies   . Hypertension   . Pneumonia   . Sleep apnea     Family History family history  includes Diabetes in his mother; Hypertension in his father.  Prior Rehab/Hospitalizations Has the patient had prior rehab or hospitalizations prior to admission?Yes, inpatient in Feb 2020, SNF from then  until readmission 08/04/18  Has the patient had major surgery during 100 days prior to admission?Yes, R BKA 04/2018, R AKA 07/2018  Current Medications  Current Facility-Administered Medications:  . acetaminophen (TYLENOL) tablet 325-650 mg, 325-650 mg, Oral, Q6H PRN, Rayburn, Shawn Montgomery, PA-C . albuterol (PROVENTIL) (2.5 MG/3ML) 0.083% nebulizer solution 3 mL, 3 mL, Inhalation, Q4H PRN, Rayburn, Shawn Montgomery, PA-C . brimonidine (ALPHAGAN) 0.2 % ophthalmic solution 1 drop, 1 drop, Both Eyes, BID, Rayburn, Shawn Montgomery, PA-C, 1 drop at 08/06/18 2306 . calcium acetate (PHOSLO) capsule 2,001 mg, 2,001 mg, Oral, TID WC, Rayburn, Shawn Montgomery, PA-C, 2,001 mg at 08/06/18 1810 . Chlorhexidine Gluconate Cloth 2 % PADS 6 each, 6 each, Topical, Q0600, Newt Minion, MD . Darbepoetin Alfa (ARANESP) injection 60 mcg, 60 mcg, Intravenous, Q Mon-HD, Corliss Parish, MD, 60 mcg at 08/07/18 1016 . docusate sodium (COLACE) capsule 100 mg, 100 mg, Oral, BID, Rayburn, Shawn Montgomery, PA-C, 100 mg at 08/06/18 2305 . fluticasone (FLONASE) 50 MCG/ACT nasal spray 1 spray, 1 spray, Each Nare, Daily, Rayburn, Shawn Montgomery, PA-C, 1 spray at 08/06/18 1021 . gabapentin (NEURONTIN) capsule 300 mg, 300 mg, Oral, BID, Newt Minion, MD . HYDROmorphone (DILAUDID) injection 0.5-1 mg, 0.5-1 mg, Intravenous, Q4H PRN, Rayburn, Shawn Montgomery, PA-C . insulin aspart (novoLOG) injection 0-5 Units, 0-5 Units, Subcutaneous, QHS, Rayburn, Neta Mends, PA-C, 2 Units at 08/05/18 2319 . insulin aspart (novoLOG) injection 0-9 Units, 0-9 Units, Subcutaneous, TID WC, Rayburn, Neta Mends, PA-C, 2 Units at 08/06/18 1810 . lactated ringers infusion, , Intravenous, Continuous, Rayburn, Neta Mends, PA-C, Stopped at 08/04/18 1835 . latanoprost (XALATAN) 0.005 % ophthalmic solution 1 drop, 1 drop, Both Eyes, QHS, Rayburn, Shawn Montgomery, PA-C, 1 drop at 08/06/18 2307 . loratadine  (CLARITIN) tablet 10 mg, 10 mg, Oral, QPM, Rayburn, Shawn Montgomery, PA-C, 10 mg at 08/06/18 1809 . metoCLOPramide (REGLAN) tablet 5-10 mg, 5-10 mg, Oral, Q8H PRN **OR** metoCLOPramide (REGLAN) injection 5-10 mg, 5-10 mg, Intravenous, Q8H PRN, Rayburn, Shawn Montgomery, PA-C . metoCLOPramide (REGLAN) tablet 5 mg, 5 mg, Oral, TID AC, Rayburn, Shawn Montgomery, PA-C, 5 mg at 08/06/18 1319 . metoprolol succinate (TOPROL-XL) 24 hr tablet 50 mg, 50 mg, Oral, QHS, Rayburn, Shawn Montgomery, PA-C, 50 mg at 08/06/18 2305 . mupirocin ointment (BACTROBAN) 2 % 1 application, 1 application, Nasal, BID, Newt Minion, MD, 1 application at 18/56/31 2305 . ondansetron (ZOFRAN) tablet 4 mg, 4 mg, Oral, Q6H PRN **OR** ondansetron (ZOFRAN) injection 4 mg, 4 mg, Intravenous, Q6H PRN, Rayburn, Neta Mends, PA-C . oxyCODONE (Oxy IR/ROXICODONE) immediate release tablet 10-15 mg, 10-15 mg, Oral, Q4H PRN, Rayburn, Neta Mends, PA-C, 15 mg at 08/06/18 2006 . oxyCODONE (Oxy IR/ROXICODONE) immediate release tablet 5-10 mg, 5-10 mg, Oral, Q4H PRN, Rayburn, Neta Mends, PA-C, 10 mg at 08/05/18 2224 . pantoprazole (PROTONIX) EC tablet 40 mg, 40 mg, Oral, Daily, Rayburn, Shawn Montgomery, PA-C, 40 mg at 08/06/18 0914 . sevelamer carbonate (RENVELA) tablet 800 mg, 800 mg, Oral, TID WC, Rayburn, Shawn Montgomery, PA-C, 800 mg at 08/06/18 1809 . timolol (TIMOPTIC) 0.5 % ophthalmic solution 1 drop, 1 drop, Both Eyes, BID, Rayburn, Shawn Montgomery, PA-C, 1 drop at 08/06/18 2305  Patients Current Diet:     Diet Order                Diet Carb Modified  Fluid consistency: Thin; Room service appropriate? YesDiet effective now                  Precautions / Restrictions Precautions Precautions: Fall Precaution Comments: wound VAC Restrictions Weight Bearing Restrictions: No  Has the patient had 2 or more falls or a fall with injury in the past year?Yes  Prior Activity  Level Community (5-7x/wk): out 3x/week for HD, also went on walks, etc for exercise. Still driving some  Prior Functional Level Self Care: Did the patient need help bathing, dressing, using the toilet or eating?Independent  Indoor Mobility: Did the patient need assistance with walking from room to room (with or without device)?Independent  Stairs: Did the patient need assistance with internal or external stairs (with or without device)?Independent  Functional Cognition: Did the patient need help planning regular tasks such as shopping or remembering to take medications?Independent  Home Assistive Devices / Equipment Home Assistive Devices/Equipment: CBG Meter, Oxygen, Wheelchair(transport), tub bench Home Equipment: Walker - 2 wheels  Prior Device Use: Indicate devices/aids used by the patient prior to current illness, exacerbation or injury?None of the above  Current Functional Level Cognition  Overall Cognitive Status: Impaired/Different from baseline Orientation Level: Oriented X4 Following Commands: Follows one step commands inconsistently, Follows one step commands with increased time Safety/Judgement: Decreased awareness of deficits, Decreased awareness of safety   Extremity Assessment (includes Sensation/Coordination)  Upper Extremity Assessment: Overall WFL for tasks assessed Lower Extremity Assessment: Defer to PT evaluation RLE Deficits / Details: wound VAC in place; pt able to perform hip flexion against gravity x1  RLE: Unable to fully assess due to pain   ADLs  Overall ADL's : Needs assistance/impaired Grooming: Set up, Sitting Upper Body Bathing: Set up, Sitting Lower Body Bathing: Moderate assistance, Sit to/from stand Lower Body Bathing Details (indicate cue type and reason): decreased functional reach to L LE, mod assist sit<>stand  Upper Body Dressing : Set up, Sitting Lower Body Dressing: Moderate assistance, Sit to/from stand Lower Body  Dressing Details (indicate cue type and reason): decreased functional reach to L LE, mod assist sit<>stand and reliant on B UE support in standing  Toilet Transfer Details (indicate cue type and reason): pt declined, sit to stand from recliner with mod assist  Toileting- Clothing Manipulation and Hygiene: Moderate assistance, Sit to/from stand Functional mobility during ADLs: Moderate assistance, Rolling walker, Cueing for safety, Cueing for sequencing General ADL Comments: pt limited by pain, weakness and impaired balance   Mobility  Overal bed mobility: Needs Assistance Bed Mobility: Supine to Sit Supine to sit: Min guard Sit to supine: Min guard General bed mobility comments: OOB upon entry   Transfers  Overall transfer level: Needs assistance Equipment used: Rolling walker (2 wheeled) Transfers: Sit to/from Stand Sit to Stand: Mod assist Stand pivot transfers: Min assist General transfer comment: mod assist to ascend from recliner with cueing for hand placement and safety   Ambulation / Gait / Stairs / Wheelchair Mobility  Ambulation/Gait General Gait Details: pt able to take a few hops on L LE with RW during pivot to chair   Posture / Balance Balance Overall balance assessment: Needs assistance Sitting-balance support: No upper extremity supported Sitting balance-Leahy Scale: Fair Standing balance support: Bilateral upper extremity supported, During functional activity Standing balance-Leahy Scale: Poor Standing balance comment: relaint on B UE support   Special needs/care consideration BiPAP/CPAPhas a CPAP, SNF says it stopped working CPMno Continuous Drip IVno DialysisyesDays MWF Life Vestno Oxygenno Special Bedno Trach Sizeno Wound Vac (  area)yesLocation R AKA Skinincision to R AKA, wounds to L foot (dorsal aspect), and L ankle Bowel mgmt:continent Bladder mgmt:anuric Diabetic mgmt:yes Behavioral considerationno  Chemo/radiationno   Previous Home Environment(from acute therapy documentation) Living Arrangements: Spouse/significant other Available Help at Discharge: Family, Available PRN/intermittently Type of Home: Apartment Home Layout: One level Home Access: Stairs to enter Technical brewer of Steps: 14 Bathroom Shower/Tub: Chiropodist: Standard Home Care Services: No  Discharge Living Setting Plans for Discharge Living Setting: Patient's home, Apartment Type of Home at Discharge: Apartment Discharge Home Layout: Other (Comment)(currently 2nd floor,wife confirmedwill have 1st floor in the next 2 weeks) Discharge Home Access: Level entry Discharge Bathroom Shower/Tub: Tub/shower unit Discharge Bathroom Toilet: Standard Discharge Bathroom Accessibility: Yes How Accessible: Accessible via walker Does the patient have any problems obtaining your medications?: No  Social/Family/Support Systems Patient Roles: Spouse, Parent Anticipated Caregiver: spouse, Howard Bell Anticipated Caregiver's Contact Information: [phone] Ability/Limitations of Caregiver: not currently working 2/2 COVID, also has a 63 y/o daughter who is out of school currently Caregiver Availability: 24/7 Discharge Plan Discussed with Primary Caregiver: Yes Is Caregiver In Agreement with Plan?: Yes Does Caregiver/Family have Issues with Lodging/Transportation while Pt is in Rehab?: No  Goals/Additional Needs Patient/Family Goal for Rehab: PT/OT supervision to mod I Expected length of stay: 12-14 days Cultural Considerations: n/a Dietary Needs: carb modified, thin Equipment Needs: tbd Special Service Needs: HD MWF Pt/Family Agrees to Admission and willing to participate: Yes Program Orientation Provided &Reviewed with Pt/Caregiver Including Roles &Responsibilities: Yes   Possible need for SNF placement upon discharge:no  Patient Condition:[facility], spoken withCSW, andpatient and spouse. Imet with patient at the bedside and discussedwith wifevia phonefor inpatient rehabilitation assessment. Patient will benefit from ongoing PT and OT, can actively participate in 3 hours of therapy a day 5 days of the week, and can make measurable gains during the admission. Patient will also benefit from the coordinated team approach during an Inpatient Acute Rehabilitation admission. The patient will receive intensive therapy as well as Rehabilitation physician, nursing, social worker, and care management interventions. Due to safety, skin/wound care, disease management, medication administration, pain management and patient educationthe patient requires 24 hour a day rehabilitation nursing. The patient is currently min/modwith mobility and basic ADLs. Discharge setting and therapy post discharge at home with home healthis anticipated. Patient has agreed to participate in the Acute Inpatient Rehabilitation Program and will admit today.  Preadmission Screen Completed AT:FT[facility]UK[facility] Howard Bell,PT, DPT5/18/202011:08 AM ______________________________________________________________________  Discussed status with Dr.Swartzon 05/18/20at 11:33 AMand received approval for admission today.  Admission Coordinator:[place] Sigmon Lannie Fields, PT,DPTtime 11:33 AM/Date05/18/20  Assessment/Plan: Diagnosis:Right AKA 1. Does the need for close, 24 hr/day Medical supervision in concert with the patient's rehab needs make it unreasonable for this patient to be served in a less intensive setting?Yes 2. Co-Morbidities requiring supervision/potential complications:ESRD, DM2, HTN, OSA, PVD 3. Due tobladder management, bowel management, safety, skin/wound care, disease management, medication administration, pain management and patient education, does the patient require 24 hr/day rehab nursing?Yes 4. Does the patient require  coordinated care of a physician, rehab nurse,PT (1-2hrs/day, 5days/week) and OT (1-2hrs/day, 5days/week)to address physical and functional deficits in the context of the above medical diagnosis(es)?Yes Addressing deficits in the following areas:balance, endurance, locomotion, strength, transferring, bowel/bladder control, bathing, dressing, feeding, grooming, toileting and psychosocial support 5. Can the patient actively participate in an intensive therapy program of at least 3 hrs of therapy 5 days a week?Yes 6. The potential for patient to  make measurable gains while on inpatient rehab isexcellent 7. Anticipated functional outcomes upon discharge from inpatients DFP:BHEBBWNJ independent and supervisionPT, modified independent and supervisionOT, n/aSLP 8. Estimated rehab length of stay to reach the above functional goals is:12-14 days 9. Anticipated D/C setting:Home 10. Anticipated post D/C treatments:HH therapy 11. Overall Rehab/Functional Prognosis:excellent  MD Signature: Meredith Staggers, MD, [place] [facility] 08/07/2018        Cosigned by: Meredith Staggers, MD at 08/07/2018 5:53 PM    Revision History     Shann Medal, PT, DPT Admissions Coordinator [phone] 08/17/18  11:01 AM

## 2018-08-17 NOTE — Consult Note (Signed)
   [facility] CM Inpatient Consult   08/17/2018  Howard Bell 05/27/1965 [id number]    Patient was evaluated for readmission within 30 days andto check if potential [place] care management servicesneeded with his Medicare/ NextGen benefits. His unplanned readmission and hospitalization risk score is 30% (extreme high).  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 Per MD narrative on 08/16/18 shows as: History of ESRD on hemodialysis Monday Wednesday Friday, type 2 diabetes mellitus on insulin, essential hypertension, diabetic gastroparesis, left-sided foot gangrene status, right-sided AKA, OSA initially came to the hospital 5/15 with dehiscence of the right transtibial amputation underwent revision of the right sided AKA and debridement of the left heel.  Wound VAC was placed and he was discharged to rehab(CIR) on oral doxycycline. Gabapentin was added for better pain control along with tramadol.But in CIR he became persistently hypoglycemic therefore his Glucotrol was discontinued but due to some confusion of patient, medical team was requested to admit the patient back to the hospital.  Per chart review and PT/ OT notes, patient's current discharge disposition is to return to [place].   Primary Care Provider isDr. McEl[place] with Primary care at [facility].  Will follow progress and disposition needs. If patient's discharge disposition changes, and if there are any community follow-up appropriate, please place a referral to [facility] care management.   For questions and additional information, please call:  Ornella Coderre A. Doneshia Hill, BSN, RN-BC [facility] Liaison Cell: [phone]

## 2018-08-17 NOTE — Discharge Summary (Signed)
Physician Discharge Summary  Howard Bell KGY:[id number] DOB: October 12, 1965 DOA: 08/15/2018  PCP: Horald Pollen, MD  Admit date: 08/15/2018 Discharge date: 08/17/2018  Admitted From: CIR Disposition:  CIR  Recommendations for Outpatient Follow-up:  1. Follow up with PCP in 1-2 weeks 2. Please obtain BMP/CBC in one week your next doctors visit.  3. Follow-up outpatient with Dr. Sharol Given for left lower extremity issues.  Recommend against antibiotics at this time   Discharge Condition: Stable CODE STATUS: Full code Diet recommendation: Diabetic  Brief/Interim Summary: History of ESRD on hemodialysis Monday Wednesday Friday, type 2 diabetes mellitus on insulin, essential hypertension, diabetic gastroparesis, left-sided foot gangrene status, right-sided AKA, OSA initially came to the hospital 5/15 with dehiscence of the right transtibial amputation underwent revision of the right sided AKA and debridement of the left heel.  Wound VAC was placed and he was discharged to rehab on oral doxycycline.  Gabapentin was added for better pain control along with tramadol.  But in CIR he became persistently hypoglycemic therefore his Glucotrol was discontinued but due to some confusion and medical team was requested to admit the patient back to the hospital.  Over the course of couple of days his mentation improved especially after dialysis and return back to normal status.  His antibiotics were discontinued.  Case was discussed with Dr. Sharol Given who recommends against antibiotics and discharging him.  We will follow-up outpatient and determine if surgery becomes necessary in the future.  Otherwise routine work-up at this time was negative.  Stable for discharge to CIR.   Discharge Diagnoses:  Principal Problem:   Sepsis ([place]) Active Problems:   Anemia due to end stage renal disease (HCC)   Type 2 diabetes mellitus with foot ulcer ([place])   Essential hypertension   ESRD on dialysis ([place])   Diabetic  gastroparesis ([place])   Glaucoma due to type 2 diabetes mellitus (HCC)   Chronic generalized pain   Severe protein-calorie malnutrition (HCC)   S/P BKA (below knee amputation), right (HCC)   Unilateral AKA, left (HCC)   Hypoglycemia   Gangrene of left foot ([place])   Acute metabolic encephalopathy  Sepsis secondary to acute on chronic left-sided heel gangrene History of severe peripheral vascular disease -  no further antibiotics per orthopedic; Dr. Sharol Given.  Hold outpatient. - Eventually patient will require left-sided AKA but not urgent per orthopedic.  Acute metabolic encephalopathy, resolving dialysis - Currently awake alert oriented.  Mentation is back to baseline. -Routine work-up including ammonia level, B12 and TSH are within normal limits. -CT the head is negative -BUN levels improved  ESRD on hemodialysis Monday Wednesday Friday - Nephrology team is following   Chronic hypoxic respiratory failure on 2 L nasal cannula -Continue supplemental oxygen.  Uncontrolled diabetes mellitus type 2, insulin-dependent Diabetic gastroparesis -Continue insulin sliding scale and Accu-Chek. - A1c 7.8 -Continue home gabapentin and promotility agents  Generalized weakness and deconditioning - Currently patient is in inpatient rehabilitation.  Severe protein calorie malnutrition - Supportive care, encourage oral diet.   Consultations:  Orthopedic  Nephrology  Subjective: Feels better today.  Sitting up in the chair.  His mentation is much clear  Discharge Exam: Vitals:   08/17/18 0407 08/17/18 0847  BP: (!) 122/46 (!) 126/38  Pulse: 79 82  Resp: 18 18  Temp: 97.6 F (36.4 C) 99 F (37.2 C)  SpO2: 95% 92%   Vitals:   08/16/18 1649 08/16/18 1939 08/17/18 0407 08/17/18 0847  BP: (!) 108/50 (!) 122/39 (!) 122/46 (!) 126/38  Pulse: 89 93 79 82  Resp: 18 18 18 18   Temp: 98.4 F (36.9 C) 99 F (37.2 C) 97.6 F (36.4 C) 99 F (37.2 C)  TempSrc: Oral Oral Oral Oral   SpO2: 100% 99% 95% 92%  Weight:   91.2 kg   Height:        General: Pt is alert, awake, not in acute distress Cardiovascular: RRR, S1/S2 +, no rubs, no gallops Respiratory: CTA bilaterally, no wheezing, no rhonchi Abdominal: Soft, NT, ND, bowel sounds + Extremities: no edema, no cyanosis  Discharge Instructions   Allergies as of 08/17/2018   No Known Allergies     Medication List    STOP taking these medications   doxycycline 100 MG capsule Commonly known as:  MONODOX   gabapentin 300 MG capsule Commonly known as:  NEURONTIN     TAKE these medications   Admelog SoloStar 100 UNIT/ML KwikPen Generic drug:  insulin lispro Inject 1-9 Units into the skin 3 (three) times daily. If bs is 121-150=1 unit, 151-200=2 units, 201-250=3 units, 251-300=5 units, 301-350=7 units, 351-400=9 units   albuterol 108 (90 Base) MCG/ACT inhaler Commonly known as:  VENTOLIN HFA Inhale 1 puff into the lungs every 4 (four) hours as needed for wheezing or shortness of breath.   brimonidine 0.2 % ophthalmic solution Commonly known as:  ALPHAGAN Place 1 drop into both eyes 2 (two) times daily.   calcium acetate 667 MG capsule Commonly known as:  PHOSLO Take 2,001 mg by mouth 3 (three) times daily with meals.   docusate sodium 100 MG capsule Commonly known as:  COLACE Take 100 mg by mouth 2 (two) times daily.   fluticasone 50 MCG/ACT nasal spray Commonly known as:  FLONASE Place 1 spray into both nostrils daily.   latanoprost 0.005 % ophthalmic solution Commonly known as:  XALATAN Place 1 drop into both eyes at bedtime.   loratadine 10 MG tablet Commonly known as:  CLARITIN Take 10 mg by mouth daily.   Melatonin 3 MG Tabs Take 3 tablets by mouth at bedtime.   metoCLOPramide 5 MG tablet Commonly known as:  Reglan Take 1 tablet (5 mg total) by mouth 3 (three) times daily before meals. What changed:    when to take this  reasons to take this   metoprolol succinate 50 MG 24 hr  tablet Commonly known as:  TOPROL-XL Take 1 tablet (50 mg total) by mouth at bedtime. Take with or immediately following a meal.   omeprazole 20 MG capsule Commonly known as:  PRILOSEC Take 20 mg by mouth at bedtime.   ondansetron 4 MG tablet Commonly known as:  ZOFRAN Take 1 tablet (4 mg total) by mouth every 8 (eight) hours as needed for nausea or vomiting.   Oxycodone HCl 10 MG Tabs Take 10 mg by mouth every 4 (four) hours as needed (pain).   sevelamer carbonate 800 MG tablet Commonly known as:  RENVELA TAKE 1 TABLET BY MOUTH DAILY WITH MEALS FOR HYPOCALCEMIA What changed:  See the new instructions.   timolol 0.5 % ophthalmic solution Commonly known as:  BETIMOL Place 1 drop into both eyes 2 (two) times daily.       No Known Allergies  You were cared for by a hospitalist during your hospital stay. If you have any questions about your discharge medications or the care you received while you were in the hospital after you are discharged, you can call the unit and asked to speak with the hospitalist on  call if the hospitalist that took care of you is not available. Once you are discharged, your primary care physician will handle any further medical issues. Please note that no refills for any discharge medications will be authorized once you are discharged, as it is imperative that you return to your primary care physician (or establish a relationship with a primary care physician if you do not have one) for your aftercare needs so that they can reassess your need for medications and monitor your lab values.   Procedures/Studies: Dg Chest 2 View  Result Date: 08/15/2018 CLINICAL DATA:  Fever. Left foot infection. History of diabetes, end-stage renal disease and pneumonia. EXAM: CHEST - 2 VIEW COMPARISON:  Radiographs 05/12/2018 and 05/02/2018. CT 05/02/2018 and 11/09/2017. FINDINGS: The heart size and mediastinal contours are stable. The patient has known chronic adenopathy in the  right paratracheal and subcarinal regions which appears stable. Vascular stents are noted in the left subclavian and axillary vessels. Patchy right perihilar and lower lobe airspace opacities appear largely chronic, not significantly changed over the recent prior studies. The left lung is clear. There is no pleural effusion or pneumothorax. No acute osseous findings. IMPRESSION: No significant change in the appearance of the chest compared with prior studies obtained over the last 8 months. The right basilar airspace opacity appears chronic and may reflect postinflammatory scarring or chronic atypical inflammation. Grossly stable mediastinal adenopathy. Electronically Signed   By: Richardean Sale M.D.   On: 08/15/2018 16:13   Ct Head Wo Contrast  Result Date: 08/15/2018 CLINICAL DATA:  Altered mental status EXAM: CT HEAD WITHOUT CONTRAST TECHNIQUE: Contiguous axial images were obtained from the base of the skull through the vertex without intravenous contrast. COMPARISON:  Head CT 11/06/2017 FINDINGS: Brain: No acute intracranial hemorrhage. Unchanged appearance of right parietal, left occipital and right cerebellar infarcts. No midline shift or other mass effect. Bilateral basal ganglia mineralization. Vascular: No abnormal hyperdensity of the major intracranial arteries or dural venous sinuses. No intracranial atherosclerosis. Skull: The visualized skull base, calvarium and extracranial soft tissues are normal. Sinuses/Orbits: No fluid levels or advanced mucosal thickening of the visualized paranasal sinuses. No mastoid or middle ear effusion. The orbits are normal. IMPRESSION: 1. No acute abnormality. 2. Unchanged appearance of left occipital, right parietal and right cerebellar infarcts. Electronically Signed   By: Ulyses Jarred M.D.   On: 08/15/2018 21:48   Dg Foot Complete Left  Result Date: 08/15/2018 CLINICAL DATA:  Fever. Left foot infection. History of diabetes, end-stage renal disease and  pneumonia. EXAM: LEFT FOOT - COMPLETE 3+ VIEW COMPARISON:  None. FINDINGS: There is no evidence of acute fracture or dislocation. There are possible old healed fractures of the 4th and 5th metatarsal necks. There are minimal degenerative changes at the 1st metatarsophalangeal joint. No erosive changes or bone destruction identified. There are prominent vascular calcifications consistent with diabetes. The soft tissues are diffusely prominent without focal swelling or foreign body. IMPRESSION: No acute osseous findings or evidence of osteomyelitis. Prominent vascular calcifications. Electronically Signed   By: Richardean Sale M.D.   On: 08/15/2018 16:15      The results of significant diagnostics from this hospitalization (including imaging, microbiology, ancillary and laboratory) are listed below for reference.     Microbiology: Recent Results (from the past 240 hour(s))  Culture, blood (routine x 2)     Status: None (Preliminary result)   Collection Time: 08/15/18  3:20 PM  Result Value Ref Range Status   Specimen Description BLOOD LEFT  ANTECUBITAL  Final   Special Requests   Final    BOTTLES DRAWN AEROBIC ONLY Blood Culture adequate volume   Culture   Final    NO GROWTH < 24 HOURS Performed at [facility], [address] [place], [place] [address]    Report Status PENDING  Incomplete  Culture, blood (routine x 2)     Status: None (Preliminary result)   Collection Time: 08/15/18  3:26 PM  Result Value Ref Range Status   Specimen Description BLOOD LEFT ANTECUBITAL  Final   Special Requests   Final    BOTTLES DRAWN AEROBIC ONLY Blood Culture adequate volume   Culture   Final    NO GROWTH < 24 HOURS Performed at [facility], [place] [address] [place],  [address]    Report Status PENDING  Incomplete     Labs: BNP (last 3 results) Recent Labs    11/06/17 0841 05/02/18 1930  BNP 1,323.3* 527.7*   Basic Metabolic Panel: Recent Labs  Lab 08/11/18 1330  08/14/18 2149 08/14/18 2356 08/15/18 1608 08/16/18 0416 08/17/18 0416  NA 133*  --  129* 131* 133* 130*  K 4.6  --  4.5 4.3 4.4 4.0  CL 92*  --  89* 93* 92* 94*  CO2 25  --  24 26 25 24   GLUCOSE 100* 56* 136* 163* 108* 168*  BUN 69*  --  103* 50* 64* 33*  CREATININE 11.06*  --  13.65* 8.40* 9.47* 6.36*  CALCIUM 9.3  --  9.3 8.5* 9.2 8.8*  MG  --   --   --   --   --  2.0  PHOS 4.2  --  3.8  --   --   --    Liver Function Tests: Recent Labs  Lab 08/11/18 1330 08/14/18 2356 08/15/18 1608 08/16/18 0416  AST  --   --  18 17  ALT  --   --  8 8  ALKPHOS  --   --  118 112  BILITOT  --   --  0.4 0.6  PROT  --   --  7.5 7.7  ALBUMIN 2.4* 2.2* 2.3* 2.2*   No results for input(s): LIPASE, AMYLASE in the last 168 hours. Recent Labs  Lab 08/15/18 2101  AMMONIA 34   CBC: Recent Labs  Lab 08/11/18 1330 08/14/18 2356 08/15/18 1525 08/16/18 0416 08/17/18 0416  WBC 9.9 8.4 9.9 10.2 10.7*  NEUTROABS  --   --  7.0  --   --   HGB 9.1* 8.4* 8.5* 8.7* 8.5*  HCT 29.3* 26.5* 27.2* 27.7* 26.9*  MCV 93.9 91.1 91.0 90.8 91.2  PLT 226 214 194 209 220   Cardiac Enzymes: No results for input(s): CKTOTAL, CKMB, CKMBINDEX, TROPONINI in the last 168 hours. BNP: Invalid input(s): POCBNP CBG: Recent Labs  Lab 08/16/18 2332 08/17/18 0334 08/17/18 0653 08/17/18 0842 08/17/18 1122  GLUCAP 187* 178* 148* 191* 202*   D-Dimer No results for input(s): DDIMER in the last 72 hours. Hgb A1c Recent Labs    08/15/18 2101 08/17/18 0416  HGBA1C 7.8* 7.5*   Lipid Profile No results for input(s): CHOL, HDL, LDLCALC, TRIG, CHOLHDL, LDLDIRECT in the last 72 hours. Thyroid function studies Recent Labs    08/15/18 2101  TSH 7.538*   Anemia work up Recent Labs    08/15/18 1608 08/15/18 2101  VITAMINB12  --  431  FERRITIN 1,085*  --    Urinalysis    Component  Value Date/Time   COLORURINE RED (A) 11/19/2016 0020   APPEARANCEUR TURBID (A) 11/19/2016 0020   LABSPEC  11/19/2016 0020     TEST NOT REPORTED DUE TO COLOR INTERFERENCE OF URINE PIGMENT   PHURINE  11/19/2016 0020    TEST NOT REPORTED DUE TO COLOR INTERFERENCE OF URINE PIGMENT   GLUCOSEU (A) 11/19/2016 0020    TEST NOT REPORTED DUE TO COLOR INTERFERENCE OF URINE PIGMENT   HGBUR (A) 11/19/2016 0020    TEST NOT REPORTED DUE TO COLOR INTERFERENCE OF URINE PIGMENT   BILIRUBINUR (A) 11/19/2016 0020    TEST NOT REPORTED DUE TO COLOR INTERFERENCE OF URINE PIGMENT   KETONESUR (A) 11/19/2016 0020    TEST NOT REPORTED DUE TO COLOR INTERFERENCE OF URINE PIGMENT   PROTEINUR (A) 11/19/2016 0020    TEST NOT REPORTED DUE TO COLOR INTERFERENCE OF URINE PIGMENT   NITRITE (A) 11/19/2016 0020    TEST NOT REPORTED DUE TO COLOR INTERFERENCE OF URINE PIGMENT   LEUKOCYTESUR (A) 11/19/2016 0020    TEST NOT REPORTED DUE TO COLOR INTERFERENCE OF URINE PIGMENT   Sepsis Labs Invalid input(s): PROCALCITONIN,  WBC,  LACTICIDVEN Microbiology Recent Results (from the past 240 hour(s))  Culture, blood (routine x 2)     Status: None (Preliminary result)   Collection Time: 08/15/18  3:20 PM  Result Value Ref Range Status   Specimen Description BLOOD LEFT ANTECUBITAL  Final   Special Requests   Final    BOTTLES DRAWN AEROBIC ONLY Blood Culture adequate volume   Culture   Final    NO GROWTH < 24 HOURS Performed at [facility], [address], [place], [place] [address]    Report Status PENDING  Incomplete  Culture, blood (routine x 2)     Status: None (Preliminary result)   Collection Time: 08/15/18  3:26 PM  Result Value Ref Range Status   Specimen Description BLOOD LEFT ANTECUBITAL  Final   Special Requests   Final    BOTTLES DRAWN AEROBIC ONLY Blood Culture adequate volume   Culture   Final    NO GROWTH < 24 HOURS Performed at [facility], [place] [address], [place], [place] [address]    Report Status PENDING  Incomplete     Time coordinating discharge:  I have spent 35 minutes face to face with the patient and  on the ward discussing the patients care, assessment, plan and disposition with other care givers. >50% of the time was devoted counseling the patient about the risks and benefits of treatment/Discharge disposition and coordinating care.   SIGNED:   Damita Lack, MD  [facility] 08/17/2018, 1:24 PM   If 7PM-7AM, please contact night-coverage [email or web address]

## 2018-08-18 ENCOUNTER — Inpatient Hospital Stay (HOSPITAL_COMMUNITY): Payer: Medicare Other | Admitting: Occupational Therapy

## 2018-08-18 ENCOUNTER — Inpatient Hospital Stay (HOSPITAL_COMMUNITY): Payer: Medicare Other | Admitting: Speech Pathology

## 2018-08-18 ENCOUNTER — Inpatient Hospital Stay (HOSPITAL_COMMUNITY): Payer: Medicare Other

## 2018-08-18 DIAGNOSIS — N186 End stage renal disease: Secondary | ICD-10-CM

## 2018-08-18 DIAGNOSIS — Z992 Dependence on renal dialysis: Secondary | ICD-10-CM

## 2018-08-18 DIAGNOSIS — K5903 Drug induced constipation: Secondary | ICD-10-CM

## 2018-08-18 DIAGNOSIS — R0989 Other specified symptoms and signs involving the circulatory and respiratory systems: Secondary | ICD-10-CM

## 2018-08-18 DIAGNOSIS — K3184 Gastroparesis: Secondary | ICD-10-CM

## 2018-08-18 DIAGNOSIS — E1142 Type 2 diabetes mellitus with diabetic polyneuropathy: Secondary | ICD-10-CM

## 2018-08-18 DIAGNOSIS — E1165 Type 2 diabetes mellitus with hyperglycemia: Secondary | ICD-10-CM

## 2018-08-18 DIAGNOSIS — R6510 Systemic inflammatory response syndrome (SIRS) of non-infectious origin without acute organ dysfunction: Secondary | ICD-10-CM

## 2018-08-18 DIAGNOSIS — I739 Peripheral vascular disease, unspecified: Secondary | ICD-10-CM

## 2018-08-18 DIAGNOSIS — E1143 Type 2 diabetes mellitus with diabetic autonomic (poly)neuropathy: Secondary | ICD-10-CM

## 2018-08-18 LAB — CBC WITH DIFFERENTIAL/PLATELET
Abs Immature Granulocytes: 0.09 10*3/uL — ABNORMAL HIGH (ref 0.00–0.07)
Basophils Absolute: 0.1 10*3/uL (ref 0.0–0.1)
Basophils Relative: 1 %
Eosinophils Absolute: 0.3 10*3/uL (ref 0.0–0.5)
Eosinophils Relative: 3 %
HCT: 28.3 % — ABNORMAL LOW (ref 39.0–52.0)
Hemoglobin: 8.9 g/dL — ABNORMAL LOW (ref 13.0–17.0)
Immature Granulocytes: 1 %
Lymphocytes Relative: 19 %
Lymphs Abs: 1.8 10*3/uL (ref 0.7–4.0)
MCH: 28.5 pg (ref 26.0–34.0)
MCHC: 31.4 g/dL (ref 30.0–36.0)
MCV: 90.7 fL (ref 80.0–100.0)
Monocytes Absolute: 1.2 10*3/uL — ABNORMAL HIGH (ref 0.1–1.0)
Monocytes Relative: 13 %
Neutro Abs: 5.9 10*3/uL (ref 1.7–7.7)
Neutrophils Relative %: 63 %
Platelets: 229 10*3/uL (ref 150–400)
RBC: 3.12 MIL/uL — ABNORMAL LOW (ref 4.22–5.81)
RDW: 16 % — ABNORMAL HIGH (ref 11.5–15.5)
WBC: 9.4 10*3/uL (ref 4.0–10.5)
nRBC: 0 % (ref 0.0–0.2)

## 2018-08-18 LAB — COMPREHENSIVE METABOLIC PANEL
ALT: 8 U/L (ref 0–44)
AST: 14 U/L — ABNORMAL LOW (ref 15–41)
Albumin: 2.3 g/dL — ABNORMAL LOW (ref 3.5–5.0)
Alkaline Phosphatase: 101 U/L (ref 38–126)
Anion gap: 14 (ref 5–15)
BUN: 52 mg/dL — ABNORMAL HIGH (ref 6–20)
CO2: 22 mmol/L (ref 22–32)
Calcium: 9.3 mg/dL (ref 8.9–10.3)
Chloride: 93 mmol/L — ABNORMAL LOW (ref 98–111)
Creatinine, Ser: 8.91 mg/dL — ABNORMAL HIGH (ref 0.61–1.24)
GFR calc Af Amer: 7 mL/min — ABNORMAL LOW (ref >60)
GFR calc non Af Amer: 6 mL/min — ABNORMAL LOW (ref >60)
Glucose, Bld: 131 mg/dL — ABNORMAL HIGH (ref 70–99)
Potassium: 4.1 mmol/L (ref 3.5–5.1)
Sodium: 129 mmol/L — ABNORMAL LOW (ref 135–145)
Total Bilirubin: 0.6 mg/dL (ref 0.3–1.2)
Total Protein: 7.9 g/dL (ref 6.5–8.1)

## 2018-08-18 LAB — CBC
HCT: 28.5 % — ABNORMAL LOW (ref 39.0–52.0)
Hemoglobin: 9 g/dL — ABNORMAL LOW (ref 13.0–17.0)
MCH: 28.8 pg (ref 26.0–34.0)
MCHC: 31.6 g/dL (ref 30.0–36.0)
MCV: 91.1 fL (ref 80.0–100.0)
Platelets: 243 10*3/uL (ref 150–400)
RBC: 3.13 MIL/uL — ABNORMAL LOW (ref 4.22–5.81)
RDW: 16 % — ABNORMAL HIGH (ref 11.5–15.5)
WBC: 9.9 10*3/uL (ref 4.0–10.5)
nRBC: 0 % (ref 0.0–0.2)

## 2018-08-18 LAB — RENAL FUNCTION PANEL
Albumin: 2.2 g/dL — ABNORMAL LOW (ref 3.5–5.0)
Anion gap: 12 (ref 5–15)
BUN: 55 mg/dL — ABNORMAL HIGH (ref 6–20)
CO2: 25 mmol/L (ref 22–32)
Calcium: 9.3 mg/dL (ref 8.9–10.3)
Chloride: 91 mmol/L — ABNORMAL LOW (ref 98–111)
Creatinine, Ser: 9.19 mg/dL — ABNORMAL HIGH (ref 0.61–1.24)
GFR calc Af Amer: 7 mL/min — ABNORMAL LOW (ref >60)
GFR calc non Af Amer: 6 mL/min — ABNORMAL LOW (ref >60)
Glucose, Bld: 156 mg/dL — ABNORMAL HIGH (ref 70–99)
Phosphorus: 2.9 mg/dL (ref 2.5–4.6)
Potassium: 4.4 mmol/L (ref 3.5–5.1)
Sodium: 128 mmol/L — ABNORMAL LOW (ref 135–145)

## 2018-08-18 LAB — GLUCOSE, CAPILLARY
Glucose-Capillary: 125 mg/dL — ABNORMAL HIGH (ref 70–99)
Glucose-Capillary: 140 mg/dL — ABNORMAL HIGH (ref 70–99)
Glucose-Capillary: 157 mg/dL — ABNORMAL HIGH (ref 70–99)

## 2018-08-18 MED ORDER — CALCIUM ACETATE (PHOS BINDER) 667 MG PO CAPS
1334.0000 mg | ORAL_CAPSULE | Freq: Three times a day (TID) | ORAL | Status: DC
  Administered 2018-08-18 – 2018-08-30 (×33): 1334 mg via ORAL
  Filled 2018-08-18 (×38): qty 2

## 2018-08-18 NOTE — H&P (Signed)
Patient is an interrupted stay, please see previous H&P from 5/18.

## 2018-08-18 NOTE — Progress Notes (Signed)
Physical Therapy Session Note  Patient Details  Name: Howard Bell MRN: [id number] Date of Birth: 09/11/1965  Today's Date: 08/18/2018 PT Individual Time: 0830-0930 PT Individual Time Calculation (min): 60 min   Short Term Goals: Week 1:  PT Short Term Goal 1 (Week 1): Patient to demonstrate dynamic sitting balance with S.  PT Short Term Goal 2 (Week 1): Patient to perform side board transfer with S including set up. PT Short Term Goal 3 (Week 1): Patient to propel w/c mod I on level indoor surfaces. PT Short Term Goal 4 (Week 1): Patient to perform sit to stand with CGA in prep for car transfer to SUV  Skilled Therapeutic Interventions/Progress Updates:  Patient in supine and agreeable to PT.  Performed supine to sit with S.  Seated balance EOB to eat peaches and drink coffee upon insistance of PT with S.  Patient S back to supine for ace wrapping R LE.  Mod A to don shorts in supine.  Patient supine to sit S.  Slide board transfer to w/c CGA after board placed.  Patient propelled w/c x 200' with S and increased time with two rest breaks.  Patient performed sit to stand x 2 to RW able to stand 1.5 minutes and min to CGA for balance in standing until reaching back to sit with L hand and mod A for dynamic balance.  Patient left in w/c with call bell in reach and seat belt alarm activated.  Discussed findings of re-assessment and plan with pt.   Patient returning to [facility] 08/17/2018 after returning to acute for concern for sepsis.  See below for re-evaluation and updated plan.    Physical Therapy Re-evaluation and Plan  Patient Details  Name: Howard Bell MRN: [id number] Date of Birth: March 16, 1966  PT Diagnosis: Abnormality of gait, Muscle weakness and Pain in R leg Rehab Potential: Good ELOS: 7-10 days   Patient Active Problem List   Diagnosis Date Noted  . SIRS (systemic inflammatory response syndrome) (HCC)   . Poorly controlled type 2 diabetes mellitus with peripheral  neuropathy ([place])   . Peripheral arterial disease ([place])   . Unilateral AKA, right ([place]) 08/17/2018  . Sepsis ([place]) 08/15/2018  . Acute metabolic encephalopathy 94/09/6806  . Gangrene of left foot ([place])   . Hypoglycemia   . Drug induced constipation   . Confusion, postoperative   . Labile blood pressure   . Uncontrolled diabetes mellitus type 2 with peripheral artery disease ([place])   . Anemia of chronic disease   . Diabetes mellitus type 2 in nonobese (HCC)   . Supplemental oxygen dependent   . Unilateral AKA, left ([place]) 08/07/2018  . S/P BKA (below knee amputation), right ([place]) 08/04/2018  . Severe protein-calorie malnutrition ([place]) 08/01/2018  . Dehiscence of amputation stump ([place]) 08/01/2018  . Cutaneous abscess of left foot 08/01/2018  . Cellulitis of right lower extremity   . Influenza A 05/12/2018  . Pneumonia 05/12/2018  . Gangrene of right foot ([place])   . Critical limb ischemia with history of revascularization of same extremity 05/09/2018  . LUQ pain   . 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 ([place]) 11/16/2017  . CKD stage 5 due to type 2 diabetes mellitus ([place]) 11/16/2017  . Glaucoma due to type 2 diabetes mellitus ([place]) 11/16/2017  . Chronic generalized pain 11/16/2017  . Recurrent pneumonia 11/06/2017  . Diabetic gastroparesis (  [place]) 11/06/2017  . Generalized abdominal pain 09/13/2017  . Acute pain of right shoulder 07/26/2017  . Acute bursitis of right shoulder 07/26/2017  . Left arm swelling 03/28/2017  . ESRD on dialysis ([place]) 03/28/2017  . Chest pain 11/26/2016  . Essential hypertension 11/26/2016  . Anemia due to end stage renal disease ([place]) 08/04/2016  . Acute respiratory failure with hypoxemia ([place]) 08/04/2016  . Type 2 diabetes mellitus with foot ulcer ([place]) 08/04/2016  . Acute CHF (congestive heart failure) ([place]) 08/04/2016  .  Elevated troponin    Assessment & Plan Clinical Impression: Patient is a12 y.o.year old [place] recent admission to the hospital on 08/04/18. History ofRight diabetic foot ulcer with infection requiring R-BKA 05/14/18 and was discharged to SNF. He developed dehiscence of wound as well as superficial abscess left heel and ulceration left ankle. He was admitted on 08/04/18 for revision to R-AKA. Patient transferred to CIR on 08/07/2018.  Patient transferred back to CIR on 08/17/2018 .   Patient currently requires min with mobility secondary to muscle weakness and pain and decreased attention, decreased problem solving and decreased memory.  Prior to hospitalization, patient was independent  with mobility and lived with Spouse in a [place] home.  Home access is  Level entry(wife just moved to ground level apartment).  Patient will benefit from skilled PT intervention to maximize safe functional mobility, minimize fall risk and decrease caregiver burden for planned discharge home with 24 hour supervision.  Anticipate patient will benefit from follow up [place] at discharge.  PT - End of Session Activity Tolerance: Improving;Decreased this session;Tolerates 30+ min activity with multiple rests Endurance Deficit: Yes Endurance Deficit Description: fatigues with w/c mobility stopping 2 x to rest arms pushing 200' PT Assessment Rehab Potential (ACUTE/IP ONLY): Good PT Barriers to Discharge: Medical stability;Wound Care PT Barriers to Discharge Comments: L LE pending needing amputation PT Patient demonstrates impairments in the following area(s): Balance;Endurance;Pain;Nutrition;Skin Integrity;Motor PT Transfers Functional Problem(s): Bed Mobility;Bed to Chair;Car PT Locomotion Functional Problem(s): Wheelchair Mobility PT Plan PT Intensity: Minimum of 1-2 x/day ,45 to 90 minutes PT Frequency: 5 out of 7 days PT Duration Estimated Length of Stay: 7-10 days PT Treatment/Interventions:  Balance/vestibular training;Functional mobility training;Patient/family education;Therapeutic Exercise;Therapeutic Activities;Skin care/wound management;UE/LE Strength taining/ROM;Wheelchair propulsion/positioning;DME/adaptive equipment instruction PT Transfers Anticipated Outcome(s): S slide board transfers PT Locomotion Anticipated Outcome(s): mod I w/c level locomotion PT Recommendation Follow Up Recommendations: Home health PT;24 hour supervision/assistance Patient destination: Home Equipment Recommended: Wheelchair (measurements);Wheelchair cushion (measurements);Sliding board  PT Re-Evaluation Precautions/Restrictions Precautions Precautions: Fall Restrictions Weight Bearing Restrictions: Yes RLE Weight Bearing: Non weight bearing  Pain Pain Assessment Pain Score: 2  Pain Type: Phantom pain Pain Location: Leg Pain Orientation: Right Pain Descriptors / Indicators: Aching Pain Onset: On-going Pain Intervention(s): Repositioned Home Living/Prior Functioning Home Living Available Help at Discharge: Family;Available 24 hours/day(wife) Type of Home: Apartment Home Access: Level entry(wife just moved to ground level apartment) Home Layout: One level  Lives With: Spouse Prior Function Level of Independence: Independent with basic ADLs;Independent with transfers;Independent with gait  Able to Take Stairs?: Yes Driving: No Vocation: On disability Comments: was at [facility] since March 2 after first amputation; dialysis MWF Vision/Perception     Cognition Overall Cognitive Status: Impaired/Different from baseline Arousal/Alertness: Awake/alert Orientation Level: Oriented X4 Sustained Attention: Impaired Sustained Attention Impairment: Verbal basic;Functional basic Memory: Impaired Memory Impairment: Storage deficit;Decreased short term memory Awareness: Impaired Awareness Impairment: Emergent impairment Problem Solving: Impaired Problem Solving Impairment: Functional  basic;Verbal basic Safety/Judgment: Impaired Sensation Sensation Light Touch:  Impaired by gross assessment Hot/Cold: Not tested Proprioception: Not tested Stereognosis: Not tested Additional Comments: decreased on L foot Coordination Gross Motor Movements are Fluid and Coordinated: Not tested Fine Motor Movements are Fluid and Coordinated: Not tested Motor  Motor Motor: Within Functional Limits Motor - Skilled Clinical Observations: generalized weakness  Mobility Bed Mobility Bed Mobility: Supine to Sit Supine to Sit: Supervision/Verbal cueing Transfers Transfers: Sit to Stand;Stand to Sit;Lateral/Scoot Transfers Sit to Stand: Minimal Assistance - Patient > 75% Stand to Sit: Minimal Assistance - Patient > 75% Lateral/Scoot Transfers: Contact Guard/Touching assist(after board placement) Transfer (Assistive device): Rolling walker(slide board) Locomotion  Gait Ambulation: No Gait Gait: No Stairs / Additional Locomotion Stairs: No Architect: Yes Wheelchair Assistance: Chartered loss adjuster: Both upper extremities Wheelchair Parts Management: Supervision/cueing  Trunk/Postural Assessment  Cervical Assessment Cervical Assessment: Exceptions to WFL(forward head) Thoracic Assessment Thoracic Assessment: Exceptions to WFL(rounded shoulders) Lumbar Assessment Lumbar Assessment: Exceptions to WFL(PPT) Postural Control Postural Control: Deficits on evaluation Righting Reactions: delayed  Balance Balance Balance Assessed: Yes Dynamic Sitting Balance Dynamic Sitting - Balance Support: Feet supported Dynamic Sitting - Level of Assistance: 4: Min assist Dynamic Sitting Balance - Compensations: guarding assist while leaning to place board Dynamic Sitting - Balance Activities: Lateral lean/weight shifting Static Standing Balance Static Standing - Balance Support: Bilateral upper extremity supported Static Standing -  Level of Assistance: 4: Min assist Static Standing - Comment/# of Minutes: standing up to 1.5 minutes with bilat UE support and min to CGA Dynamic Standing Balance Dynamic Standing - Balance Support: Bilateral upper extremity supported Dynamic Standing - Level of Assistance: 3: Mod assist Dynamic Standing - Balance Activities: Forward lean/weight shifting Dynamic Standing - Comments: leaning reaching for armrest Extremity Assessment      RLE Assessment RLE Assessment: Exceptions to [facility] Active Range of Motion (AROM) Comments: WFL but slow and mild pain General Strength Comments: grossly 4-/5 LLE Assessment LLE Assessment: Exceptions to [facility] Active Range of Motion (AROM) Comments: limited ankle DF due to weakness General Strength Comments: ankle DF strength 3-/5, knee extension 4/5, hip flexion 4-/5    Refer to Care Plan for Long Term Goals  Recommendations for other services: None   Discharge Criteria: Patient will be discharged from PT if patient refuses treatment 3 consecutive times without medical reason, if treatment goals not met, if there is a change in medical status, if patient makes no progress towards goals or if patient is discharged from hospital.  The above assessment, treatment plan, treatment alternatives and goals were discussed and mutually agreed upon: by patient     Therapy/Group: Individual Therapy  [place], PT 08/18/2018, 4:07 PM

## 2018-08-18 NOTE — Progress Notes (Signed)
[place] PHYSICAL MEDICINE & REHABILITATION PROGRESS NOTE  Subjective/Complaints: Patient seen sitting up at the edge of his bed this morning, working with therapies.  He states he slept well overnight.  He remains confused and slowed.  ROS: Denies CP, shortness of breath, nausea, vomiting, diarrhea.  Objective: Vital Signs: Blood pressure (!) 135/52, pulse 80, temperature 98.4 F (36.9 C), temperature source Oral, resp. rate 20, height 6' (1.829 m), weight 91.2 kg, SpO2 96 %. No results found. Recent Labs    08/17/18 0416 08/18/18 0435  WBC 10.7* 9.4  HGB 8.5* 8.9*  HCT 26.9* 28.3*  PLT 220 229   Recent Labs    08/17/18 0416 08/18/18 0435  NA 130* 129*  K 4.0 4.1  CL 94* 93*  CO2 24 22  GLUCOSE 168* 131*  BUN 33* 52*  CREATININE 6.36* 8.91*  CALCIUM 8.8* 9.3    Physical Exam: BP (!) 135/52 (BP Location: Right Arm)   Pulse 80   Temp 98.4 F (36.9 C) (Oral)   Resp 20   Ht 6' (1.829 m)   Wt 91.2 kg   SpO2 96%   BMI 27.27 kg/m  Constitutional: No distress . Vital signs reviewed. HENT: Normocephalic.  Atraumatic. Eyes: EOMI. No discharge. Cardiovascular: No JVD. Respiratory: Normal effort. GI: Non-distended. Musc: Right AKA with edema and tenderness Neurological: Alert Motor: Bilateral upper extremities: 5/5 proximal distal, except for left digit contractures Right lower extremity: Hip flexion 4+/5 Left lower extremity: 4/5 proximal to distal Skin:Left lower extremity with necrotic changes.  Please see media tab.  RLE: with dressing C/D/I.  Psychiatric:Flat and disengaged  Assessment/Plan: 1. 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  Physiatrist is providing close team supervision and 24 hour management of active medical problems listed below.  Physiatrist and rehab team continue to assess barriers to  discharge/monitor patient progress toward functional and medical goals  Care Tool:  Bathing              Bathing assist       Upper Body Dressing/Undressing Upper body dressing        Upper body assist      Lower Body Dressing/Undressing Lower body dressing            Lower body assist       Toileting Toileting    Toileting assist       Transfers Chair/bed transfer  Transfers assist           Locomotion Ambulation   Ambulation assist              Walk 10 feet activity   Assist           Walk 50 feet activity   Assist           Walk 150 feet activity   Assist           Walk 10 feet on uneven surface  activity   Assist           Wheelchair     Assist               Wheelchair 50 feet with 2 turns activity    Assist            Wheelchair 150 feet activity     Assist            Medical  Problem List and Plan: 1.Functional and mobility deficitssecondary to right BKA/PADwith revision to right AKA, complicated by SIRS             Resume CIR  Notes reviewed-treated for sepsis, no changes in wound care per Ortho, labs reviewed 2. Antithrombotics: -DVT/anticoagulation:Pharmaceutical:Heparin -antiplatelet therapy: N/A 3. Pain Management:             Continue gabapentin              Continue oxycodone prn.  4. Mood:LCSW to follow for evaluation and support. -antipsychotic agents: N/A 5. Neuropsych: This patientis not fullycapable of making decisions on hisown behalf.  DCed gabapentin and Ultram 6. Skin/Wound Care:             Discussed with Ortho and PA previously- Silvadene dressing.  Severe PVD with limited blood flow per vascular.  Monitor right lower extremity  Monitor left lower extremity dry gangrene 7. Fluids/Electrolytes/Nutrition:Strict I/O.  8. T2DM: Hemoglobin A1c 10.1 on 5/18.  Continue to monitor BS ac/hs and use SSI for elevated  BS.              Monitor with increased mobility 9. ESRD: HD MWF. Schedule atend of the day to allow participation with therapies during the day and to assistwith activity tolerance.              Recs per nephro 10. Diabetic gastroparesis: On Reglan tid ac.  Will consider decreasing if necessary 11. Anemia of chronic disease: Continue Aranesp weekly for supplement.              Hemoglobin 8.9 on 5/29             Continue to monitor 12. Glaucoma: Managed with Timoptic, Alphagan and Xalatan. 13.  Labile blood pressure             Monitor with increased mobility 14.  Constipation immobility with opioids  Continue bowel meds 15.  SIRS  Continue IV Vanc, cefepime, Flagyl  Blood cultures no growth to date.  LOS: 1 days A FACE TO FACE EVALUATION WAS PERFORMED  Ankit Lorie Phenix 08/18/2018, 11:50 AM

## 2018-08-18 NOTE — Progress Notes (Signed)
Occupational Therapy Session Note INTERRUPTED STAY   Patient Details  Name: Howard Bell MRN: [id number] Date of Birth: 08/30/1965  Today's Date: 08/18/2018 OT Individual Time: 0940-1040 OT Individual Time Calculation (min): 60 min    Short Term Goals: Week 2:  OT Short Term Goal 1 (Week 2): Patient will complete toileting with Min assist  OT Short Term Goal 2 (Week 2): Patient will complete toilet transfers with Min assist and appropriate DME as needed  Skilled Therapeutic Interventions/Progress Updates:     Pt was transferred off unit less than 2 days ago and is now readmitted. A full evaluation was not performed.  See initial evaluation note below.  A reassessment completed today.  Pt received in wc in gown and declined bathing and donning clothing as he will be going to dialysis today and would prefer doing this tomorrow.  He was anxious to shave and had an Copy. Pt positioned at sink to complete shaving, washing face and oral care.  Changed into a clean hospital gown.  Pt agreeable to working on slide board transfers to toilet. He had on boxer shorts to protect his bottom on the board.  A wide drop arm commode placed over toilet with a piece of dycem to keep board from sliding.  Pt needed A to place board but then he slid onto toilet with min A.  He stated he did not need to toilet so transferred back to w/c with min A. Discussed with pt that we would continue to have S goals for OT but would focus on slide board transfers as he will likely need to do those only in the future.  Pt very alert and social today asking a lot of questions. Pt resting in w/c with belt alarm on and all needs met.   OT EVALUATION updated 08/18/18 Occupational Therapy Assessment and Plan  Patient Details  Name: Howard Bell MRN: [id number] Date of Birth: 1965-07-17  OT Diagnosis: muscle weakness (generalized) Rehab Potential: Rehab Potential (ACUTE ONLY): Good ELOS: 7-10  days         Problem List:      Patient Active Problem List   Diagnosis Date Noted  . Anemia of chronic disease   . Diabetes mellitus type 2 in nonobese (HCC)   . Supplemental oxygen dependent   . Unilateral AKA, left ([place]) 08/07/2018  . S/P BKA (below knee amputation), right ([place]) 08/04/2018  . Severe protein-calorie malnutrition ([place]) 08/01/2018  . Dehiscence of amputation stump ([place]) 08/01/2018  . Cutaneous abscess of left foot 08/01/2018  . Cellulitis of right lower extremity   . Influenza A 05/12/2018  . Pneumonia 05/12/2018  . Gangrene of right foot ([place])   . Critical limb ischemia with history of revascularization of same extremity 05/09/2018  . LUQ pain   . 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 ([place]) 11/16/2017  . CKD stage 5 due to type 2 diabetes mellitus ([place]) 11/16/2017  . Glaucoma due to type 2 diabetes mellitus ([place]) 11/16/2017  . Chronic generalized pain 11/16/2017  . Recurrent pneumonia 11/06/2017  . Diabetic gastroparesis ([place]) 11/06/2017  . Generalized abdominal pain 09/13/2017  . Acute pain of right shoulder 07/26/2017  . Acute bursitis of right shoulder 07/26/2017  . Left arm swelling 03/28/2017  . ESRD on dialysis ([place]) 03/28/2017  . Chest pain 11/26/2016  . Essential hypertension 11/26/2016  . Anemia due to  end stage renal disease ([place]) 08/04/2016  . Acute respiratory failure with hypoxemia ([place]) 08/04/2016  . Type 2 diabetes mellitus with foot ulcer ([place]) 08/04/2016  . Acute CHF (congestive heart failure) ([place]) 08/04/2016  . Elevated troponin     Past Medical History:      Past Medical History:  Diagnosis Date  . Arthritis   . Congestive heart failure (CHF) ([place])   . Depression   . 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 Diabetic gastroparesis ([place]) 11/06/2017  . ESRD (end stage  renal disease) on dialysis ([place])   . H/O seasonal allergies   . Hypertension   . Pneumonia   . Sleep apnea    Past Surgical History:       Past Surgical History:  Procedure Laterality Date  . AMPUTATION Right 05/14/2018   Procedure: AMPUTATION BELOW KNEE;  Surgeon: Newt Minion, MD;  Location: [facility];  Service: Orthopedics;  Laterality: Right;  . AMPUTATION Right 08/04/2018   Procedure: RIGHT ABOVE KNEE AMPUTATION;  Surgeon: Newt Minion, MD;  Location: [facility];  Service: Orthopedics;  Laterality: Right;  . ESOPHAGOGASTRODUODENOSCOPY (EGD) WITH PROPOFOL N/A 12/05/2017   Procedure: ESOPHAGOGASTRODUODENOSCOPY (EGD) WITH PROPOFOL;  Surgeon: Doran Stabler, MD;  [facility];  Service: Gastroenterology;  Laterality: N/A;  . HERNIA REPAIR    . IR AV DIALY SHUNT INTRO NEEDLE/INTRACATH INITIAL W/PTA/IMG LEFT  03/30/2017    Assessment & Plan Clinical Impression: Patient is a 53 y.o. year old male with recent admission to the hospital on 08/04/18. History of Right diabetic foot ulcer with infection requiring R-BKA 05/14/18 and was discharged to SNF. He developed dehiscence of wound as well as superficial abscess left heel and ulceration left ankle. He was admitted on 08/04/18 for revision to R-AKA.  Patient transferred to CIR on 08/07/2018 .    Patient currently requires max with basic self-care skills secondary to muscle weakness and absence of RLE.  Prior to hospitalization, patient could complete basic ADL and IADL with independent .  Patient will benefit from skilled intervention to increase independence with basic self-care skills prior to discharge home with care partner.  Anticipate patient will require intermittent supervision and follow up home health.  OT - End of Session Activity Tolerance: Tolerates 10 - 20 min activity with multiple rests Endurance Deficit: Yes Endurance Deficit Description: Patient tolerates seated activities without oxygen with no SOB. PT  did report that patient's oxygen stats decreased during PT evaluation. OT Assessment Rehab Potential (ACUTE ONLY): Good OT Barriers to Discharge: Inaccessible home environment OT Barriers to Discharge Comments: Pt currently lives in 2nd floor apartment although reports that it's already in the works to move to a 1st floor apartment in approximately 2 weeks.  OT Patient demonstrates impairments in the following area(s): Warehouse manager;Endurance;Motor;Safety;Pain OT Basic ADL's Functional Problem(s): Grooming;Bathing;Dressing;Toileting OT Transfers Functional Problem(s): Toilet;Tub/Shower OT Additional Impairment(s): None OT Plan OT Intensity: Minimum of 1-2 x/day, 45 to 90 minutes OT Frequency: 5 out of 7 days OT Duration/Estimated Length of Stay: 7-10 days OT Treatment/Interventions: Balance/vestibular training;Neuromuscular re-education;Self Care/advanced ADL retraining;Therapeutic Exercise;Wheelchair propulsion/positioning;DME/adaptive equipment instruction;Pain management;Skin care/wound managment;UE/LE Strength taining/ROM;Patient/family education;UE/LE Coordination activities;Discharge planning;Functional mobility training;Therapeutic Activities OT Self Feeding Anticipated Outcome(s): Independent OT Basic Self-Care Anticipated Outcome(s): SBA OT Toileting Anticipated Outcome(s): SBA OT Bathroom Transfers Anticipated Outcome(s): S OT Recommendation Patient destination: Home Follow Up Recommendations: Home health OT Equipment Recommended: 3 in 1 bedside comode;Tub/shower bench;To be determined     OT Evaluation -  updated on 08/18/18   Precautions/Restrictions  Precautions Precautions: None Precaution Comments: wound VAC surgical incision. Right AKA.  Restrictions Weight Bearing Restrictions: Yes RLE Weight Bearing: Non weight bearing Home Living/Prior Functioning Home Living Family/patient expects to be discharged to:: Private residence Living Arrangements:  Spouse/significant other Available Help at Discharge: Family, Available 24 hours/day(wife) Type of Home: Apartment(2nd floor apt. 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  Prior Function Level of Independence: Independent with basic ADLs, Independent with transfers, Independent with gait  Able to Take Stairs?: Yes Driving: No Vocation: On disability Comments: was at [facility] since March 2 after first amputation; dialysis MWF ADL ADL Eating: Independent Where Assessed-Eating: Chair Grooming: Minimal assistance Where Assessed-Grooming: Sitting at sink Upper Body Bathing: Setup Where Assessed-Upper Body Bathing: Sitting at sink Lower Body Bathing: Dependent Where Assessed-Lower Body Bathing: Standing at sink, Sitting at sink Upper Body Dressing: Minimal assistance Where Assessed-Upper Body Dressing: Sitting at sink Lower Body Dressing: Dependent Where Assessed-Lower Body Dressing: Sitting at sink, Standing at sink Toileting: Maximal cueing Where Assessed-Toileting: Glass blower/designer: Field seismologist Method: Nurse, adult: Grab bars Vision Baseline Vision/History: Wears glasses Wears Glasses: At all times Patient Visual Report: No change from baseline Cognition Overall Cognitive Status: Within Functional Limits for tasks assessed Arousal/Alertness: Awake/alert Orientation Level: Person;Place;Situation Person: Oriented Place: Oriented Situation: Oriented Year: 2020 Month: May Day of Week: Correct Immediate Memory Recall: Sock;Blue;Bed Memory Recall: Sock;Bed;Blue Memory Recall Sock: Without  Cue Memory Recall Blue: Without Cue Memory Recall Bed: Without Cue Sensation Sensation Light Touch: Appears Intact Hot/Cold: Appears Intact Proprioception: Appears Intact Stereognosis: Appears Intact Motor  Motor Motor: Within Functional Limits Mobility  Transfers Sit to Stand: Moderate Assistance - Patient 50-74%  Balance Balance Balance Assessed: Yes Dynamic Sitting Balance Dynamic Sitting - Balance Support: Feet supported Dynamic Sitting - Level of Assistance: 5: Stand by assistance Extremity/Trunk Assessment RUE Assessment RUE Assessment: Within Functional Limits LUE Assessment LUE Assessment: Within Functional Limits     Refer to Care Plan for Long Term Goals  Recommendations for other services: None    Discharge Criteria: Patient will be discharged from OT if patient refuses treatment 3 consecutive times without medical reason, if treatment goals not met, if there is a change in medical status, if patient makes no progress towards goals or if patient is discharged from hospital.  The above assessment, treatment plan, treatment alternatives and goals were discussed and mutually agreed upon: by patient     Therapy Documentation Precautions:  Precautions Precautions: Fall Restrictions Weight Bearing Restrictions: Yes RLE Weight Bearing: Non weight bearing    Pain: Pain Assessment Pain Scale: 0-10 Pain Score: 2  Pain Type: Phantom pain Pain Location: Leg Pain Orientation: Right Pain Descriptors / Indicators: Aching Pain Frequency: Intermittent Pain Onset: On-going Pain Intervention(s): Repositioned      Therapy/Group: Individual Therapy  [place] 08/18/2018, 12:17 PM

## 2018-08-18 NOTE — Evaluation (Signed)
Speech Language Pathology Assessment and Plan  Patient Details  Name: Howard Bell MRN: [id number] Date of Birth: March 29, 1965  SLP Diagnosis: Cognitive Impairments  Rehab Potential: Excellent ELOS: 7-10 days     Today's Date: 08/18/2018 SLP Individual Time: 1100-1155 SLP Individual Time Calculation (min): 55 min   Problem List:  Patient Active Problem List   Diagnosis Date Noted  . SIRS (systemic inflammatory response syndrome) (HCC)   . Poorly controlled type 2 diabetes mellitus with peripheral neuropathy ([place])   . Peripheral arterial disease ([place])   . Unilateral AKA, right ([place]) 08/17/2018  . Sepsis ([place]) 08/15/2018  . Acute metabolic encephalopathy 03/50/0938  . Gangrene of left foot ([place])   . Hypoglycemia   . Drug induced constipation   . Confusion, postoperative   . Labile blood pressure   . Uncontrolled diabetes mellitus type 2 with peripheral artery disease ([place])   . Anemia of chronic disease   . Diabetes mellitus type 2 in nonobese (HCC)   . Supplemental oxygen dependent   . Unilateral AKA, left ([place]) 08/07/2018  . S/P BKA (below knee amputation), right ([place]) 08/04/2018  . Severe protein-calorie malnutrition ([place]) 08/01/2018  . Dehiscence of amputation stump ([place]) 08/01/2018  . Cutaneous abscess of left foot 08/01/2018  . Cellulitis of right lower extremity   . Influenza A 05/12/2018  . Pneumonia 05/12/2018  . Gangrene of right foot ([place])   . Critical limb ischemia with history of revascularization of same extremity 05/09/2018  . LUQ pain   . 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 ([place]) 11/16/2017  . CKD stage 5 due to type 2 diabetes mellitus ([place]) 11/16/2017  . Glaucoma due to type 2 diabetes mellitus ([place]) 11/16/2017  . Chronic generalized pain 11/16/2017  . Recurrent pneumonia 11/06/2017  . Diabetic gastroparesis ([place])  11/06/2017  . Generalized abdominal pain 09/13/2017  . Acute pain of right shoulder 07/26/2017  . Acute bursitis of right shoulder 07/26/2017  . Left arm swelling 03/28/2017  . ESRD on dialysis ([place]) 03/28/2017  . Chest pain 11/26/2016  . Essential hypertension 11/26/2016  . Anemia due to end stage renal disease ([place]) 08/04/2016  . Acute respiratory failure with hypoxemia ([place]) 08/04/2016  . Type 2 diabetes mellitus with foot ulcer ([place]) 08/04/2016  . Acute CHF (congestive heart failure) ([place]) 08/04/2016  . Elevated troponin    Past Medical History:  Past Medical History:  Diagnosis Date  . Arthritis   . Congestive heart failure (CHF) ([place])   . Depression   . 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 Diabetic gastroparesis ([place]) 11/06/2017  . ESRD (end stage renal disease) on dialysis ([place])   . H/O seasonal allergies   . Hypertension   . Pneumonia   . Sleep apnea    Past Surgical History:  Past Surgical History:  Procedure Laterality Date  . AMPUTATION Right 05/14/2018   Procedure: AMPUTATION BELOW KNEE;  Surgeon: Newt Minion, MD;  Location: [facility];  Service: Orthopedics;  Laterality: Right;  . AMPUTATION Right 08/04/2018   Procedure: RIGHT ABOVE KNEE AMPUTATION;  Surgeon: Newt Minion, MD;  Location: [facility];  Service: Orthopedics;  Laterality: Right;  . ESOPHAGOGASTRODUODENOSCOPY (EGD) WITH PROPOFOL N/A 12/05/2017   Procedure: ESOPHAGOGASTRODUODENOSCOPY (EGD) WITH PROPOFOL;  Surgeon: Doran Stabler, MD;  [facility];  Service: Gastroenterology;  Laterality: N/A;  . HERNIA  REPAIR    . IR AV DIALY SHUNT INTRO NEEDLE/INTRACATH INITIAL W/PTA/IMG LEFT  03/30/2017    Assessment / Plan / Recommendation Clinical Impression Patient is an interrupted stay due to transfer to acute for 2 days. Please see initial H&P dated 08/07/18.  Patient was re-assessed and the MoCA-BASIC was administered. Patient scored 15/30 points with a score of 26 or above  considered normal. Overall, patient is now more alert and his cognitive functioning has improved. However, he continues to demonstrate overall moderate cognitive impairments impacting sustained attention, functional problem solving and recall which impacts his safety with functional and familiar tasks. Patient would benefit from skilled SLP intervention to maximize his cognitive functioning and overall functional independence prior to discharge.    Skilled Therapeutic Interventions          Administered a standardized cognitive-linguistic evaluation, please see above for details. SLP facilitated session by providing Mod A verbal cues for functional problem solving during functional tasks like attempting to charge his cell phone and calling family members. Patient left upright in wheelchair with alarm on and all needs within reach. Continue with current plan of care.    SLP Assessment  Patient will need skilled [place]ology Services during CIR admission    Recommendations  Oral Care Recommendations: Oral care BID Recommendations for Other Services: Neuropsych consult Patient destination: Home Follow up Recommendations: 24 hour supervision/assistance;Home Health SLP Equipment Recommended: None recommended by SLP    SLP Frequency 3 to 5 out of 7 days   SLP Duration  SLP Intensity  SLP Treatment/Interventions 7-10 days   Minumum of 1-2 x/day, 30 to 90 minutes  Cognitive remediation/compensation;Cueing hierarchy;Functional tasks;Patient/family education;Therapeutic Activities;Environmental controls;Internal/external aids;Speech/Language facilitation    Pain Pain Assessment Pain Score: 2  Pain Type: Phantom pain Pain Location: Leg Pain Orientation: Right Pain Descriptors / Indicators: Aching Pain Onset: On-going Pain Intervention(s): Repositioned  Prior Functioning Type of Home: Apartment  Lives With: Spouse Available Help at Discharge: Family;Available 24  hours/day(wife) Vocation: On disability  Short Term Goals: Week 1: SLP Short Term Goal 1 (Week 1): STGs=LTGs  Refer to Care Plan for Long Term Goals  Recommendations for other services: Neuropsych  Discharge Criteria: Patient will be discharged from SLP if patient refuses treatment 3 consecutive times without medical reason, if treatment goals not met, if there is a change in medical status, if patient makes no progress towards goals or if patient is discharged from hospital.  The above assessment, treatment plan, treatment alternatives and goals were discussed and mutually agreed upon: by patient  PAYNE, COURTNEY 08/18/2018, 12:54 PM

## 2018-08-18 NOTE — Progress Notes (Addendum)
[facility] Progress Note   Subjective:  Seen in room. Having Zoom call with wife. No complaints. For HD today  Objective Vitals:   08/17/18 1852 08/17/18 2058 08/18/18 0606 08/18/18 1355  BP:  (!) 144/53 (!) 135/52 (!) 174/57  Pulse:  89 80 90  Resp: 18 20 20 16   Temp:  99.1 F (37.3 C) 98.4 F (36.9 C) 98.2 F (36.8 C)  TempSrc:  Oral Oral Oral  SpO2:  (!) 88% 96% 98%  Weight: 91.2 kg     Height: 6' (1.829 m)      Physical Exam General: Chronically ill appearing man, NAD. Aware/alert. Heart: RRR; 2/6 murmur Lungs: CTAB Abdomen: soft Extremities: R AKA bandaged; LLE without edema; scattered wounds - large wound bandaged/not examined (see photos under "media") Dialysis Access: LUE AVF + thrill  Additional Objective Labs: Basic Metabolic Panel: Recent Labs  Lab 08/14/18 2356  08/17/18 0416 08/18/18 0435 08/18/18 1133  NA 129*   < > 130* 129* 128*  K 4.5   < > 4.0 4.1 4.4  CL 89*   < > 94* 93* 91*  CO2 24   < > 24 22 25   GLUCOSE 136*   < > 168* 131* 156*  BUN 103*   < > 33* 52* 55*  CREATININE 13.65*   < > 6.36* 8.91* 9.19*  CALCIUM 9.3   < > 8.8* 9.3 9.3  PHOS 3.8  --   --   --  2.9   < > = values in this interval not displayed.   Liver Function Tests: Recent Labs  Lab 08/15/18 1608 08/16/18 0416 08/18/18 0435 08/18/18 1133  AST 18 17 14*  --   ALT 8 8 8   --   ALKPHOS 118 112 101  --   BILITOT 0.4 0.6 0.6  --   PROT 7.5 7.7 7.9  --   ALBUMIN 2.3* 2.2* 2.3* 2.2*   CBC: Recent Labs  Lab 08/15/18 1525 08/16/18 0416 08/17/18 0416 08/18/18 0435 08/18/18 1133  WBC 9.9 10.2 10.7* 9.4 9.9  NEUTROABS 7.0  --   --  5.9  --   HGB 8.5* 8.7* 8.5* 8.9* 9.0*  HCT 27.2* 27.7* 26.9* 28.3* 28.5*  MCV 91.0 90.8 91.2 90.7 91.1  PLT 194 209 220 229 243   Blood Culture    Component Value Date/Time   SDES BLOOD LEFT ANTECUBITAL 08/15/2018 1526   SPECREQUEST  08/15/2018 1526    BOTTLES DRAWN AEROBIC ONLY Blood Culture adequate volume   CULT   08/15/2018 1526    NO GROWTH 3 DAYS Performed at [facility] [address] [place], [place] [address]    REPTSTATUS PENDING 08/15/2018 1526   Iron Studies:  Recent Labs    08/15/18 1608  FERRITIN 1,085*   Studies/Results: No results found. Medications: . ceFEPime (MAXIPIME) IV    . metronidazole 500 mg (08/18/18 0550)  . vancomycin     . brimonidine  1 drop Both Eyes BID  . calcium acetate  2,001 mg Oral TID WC  . [START ON 08/21/2018] darbepoetin (ARANESP) injection - DIALYSIS  150 mcg Intravenous Q Mon-HD  . docusate sodium  100 mg Oral BID  . fluticasone  1 spray Each Nare Daily  . heparin  5,000 Units Subcutaneous Q8H  . latanoprost  1 drop Both Eyes QHS  . metoCLOPramide  5 mg Oral BID AC  . metoprolol succinate  50 mg Oral QHS  . pantoprazole  40 mg Oral Daily  .  sevelamer carbonate  800 mg Oral TID WC  . silver sulfADIAZINE   Topical Daily  . sodium chloride flush  3 mL Intravenous Q12H  . timolol  1 drop Both Eyes BID    Dialysis Orders: SW MWF 4h 43min 450/2x 95.5kg 2/2.25 bath P2 Hep 9000 L AVF - Parsabiv 10mg  TIW - Mircera 150 mcg IV q 2 weeks (last 5/4)  Assessment/Plan: 1. 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 Sharol Given. 2. LLE gangrene (new): Concern that was infected; BCx drawn 5/26 negative. Started on Vanc/Cefepime. Dr. Sharol Given consulted and has recommended L AKA in the future - no urgent need, not felt to be infected. 3. ESRD: Continue HD per MWF schedule- HD today.  4. Hypertension/volume: BPup today -  Follow post HD. Will need to lower EDW on discharge. 5. Anemia: Hgb9.0.Continue Aranesp126mcg q Monday. IV iron held. 6. Metabolic bone disease: Ca. Phos trending down. Reduce Ca acetate binder. Continue Ca acetate + Renvela as binder. Parsabiv not available in hospital. Follow labs.  7. T2DM:Insulin per primary. Hypoglycemia overnight 5/25 - glucotrol stopped. 8. Twitchiness: Could be reglan EPS vs gabapentin myoclonus,  follow. 9.  AMS (5/26 - 5/27): Hypoglycemia v. meds v. delirium v. L heel gangrene - improved.  Lynnda Child PA-C [facility] Pager [phone] 08/18/2018,2:30 PM  Pt seen, examined and agree w A/P as above.  Kelly Splinter  MD 08/18/2018, 4:38 PM

## 2018-08-19 ENCOUNTER — Inpatient Hospital Stay (HOSPITAL_COMMUNITY): Payer: Medicare Other | Admitting: Physical Therapy

## 2018-08-19 ENCOUNTER — Inpatient Hospital Stay (HOSPITAL_COMMUNITY): Payer: Medicare Other

## 2018-08-19 ENCOUNTER — Inpatient Hospital Stay (HOSPITAL_COMMUNITY): Payer: Medicare Other | Admitting: Speech Pathology

## 2018-08-19 DIAGNOSIS — D638 Anemia in other chronic diseases classified elsewhere: Secondary | ICD-10-CM

## 2018-08-19 DIAGNOSIS — I96 Gangrene, not elsewhere classified: Secondary | ICD-10-CM

## 2018-08-19 LAB — GLUCOSE, CAPILLARY
Glucose-Capillary: 219 mg/dL — ABNORMAL HIGH (ref 70–99)
Glucose-Capillary: 138 mg/dL — ABNORMAL HIGH (ref 70–99)

## 2018-08-19 MED ORDER — METHOCARBAMOL 500 MG PO TABS
250.0000 mg | ORAL_TABLET | Freq: Two times a day (BID) | ORAL | Status: DC | PRN
  Administered 2018-08-19 – 2018-08-30 (×14): 250 mg via ORAL
  Filled 2018-08-19 (×16): qty 1

## 2018-08-19 NOTE — Progress Notes (Signed)
[facility] Progress Note   Subjective: Seen in room, working with OT. No CP/dyspnea or other c/o today.   Objective Vitals:   08/18/18 2018 08/18/18 2043 08/18/18 2045 08/19/18 0511  BP: (!) 109/48 (!) 134/34 (!) 155/54 (!) 136/44  Pulse: 96 96 96 87  Resp:  16  16  Temp:  99.3 F (37.4 C)  99 F (37.2 C)  TempSrc:  Oral  Oral  SpO2:  91% 94% 92%  Weight:      Height:       Physical Exam General:Chronically ill appearing man, NAD. Aware/alert. Heart:RRR; 2/6 murmur Lungs:CTAB Abdomen:soft Extremities:R AKA bandaged; LLE without edema; scattered wounds - large wound bandaged/not examined (see photos under "media") Dialysis Access:LUE AVF + thrill  Additional Objective Labs: Basic Metabolic Panel: Recent Labs  Lab 08/14/18 2356  08/17/18 0416 08/18/18 0435 08/18/18 1133  NA 129*   < > 130* 129* 128*  K 4.5   < > 4.0 4.1 4.4  CL 89*   < > 94* 93* 91*  CO2 24   < > 24 22 25   GLUCOSE 136*   < > 168* 131* 156*  BUN 103*   < > 33* 52* 55*  CREATININE 13.65*   < > 6.36* 8.91* 9.19*  CALCIUM 9.3   < > 8.8* 9.3 9.3  PHOS 3.8  --   --   --  2.9   < > = values in this interval not displayed.   Liver Function Tests: Recent Labs  Lab 08/15/18 1608 08/16/18 0416 08/18/18 0435 08/18/18 1133  AST 18 17 14*  --   ALT 8 8 8   --   ALKPHOS 118 112 101  --   BILITOT 0.4 0.6 0.6  --   PROT 7.5 7.7 7.9  --   ALBUMIN 2.3* 2.2* 2.3* 2.2*   CBC: Recent Labs  Lab 08/15/18 1525 08/16/18 0416 08/17/18 0416 08/18/18 0435 08/18/18 1133  WBC 9.9 10.2 10.7* 9.4 9.9  NEUTROABS 7.0  --   --  5.9  --   HGB 8.5* 8.7* 8.5* 8.9* 9.0*  HCT 27.2* 27.7* 26.9* 28.3* 28.5*  MCV 91.0 90.8 91.2 90.7 91.1  PLT 194 209 220 229 243   CBG: Recent Labs  Lab 08/17/18 2235 08/18/18 0610 08/18/18 1149 08/18/18 2123 08/19/18 0623  GLUCAP 269* 125* 140* 157* 219*   Medications: . ceFEPime (MAXIPIME) IV 2 g (08/18/18 2301)  . metronidazole 500 mg (08/19/18 0547)   . vancomycin 1,000 mg (08/18/18 1844)   . brimonidine  1 drop Both Eyes BID  . calcium acetate  1,334 mg Oral TID WC  . [START ON 08/21/2018] darbepoetin (ARANESP) injection - DIALYSIS  150 mcg Intravenous Q Mon-HD  . docusate sodium  100 mg Oral BID  . fluticasone  1 spray Each Nare Daily  . heparin  5,000 Units Subcutaneous Q8H  . latanoprost  1 drop Both Eyes QHS  . metoCLOPramide  5 mg Oral BID AC  . metoprolol succinate  50 mg Oral QHS  . pantoprazole  40 mg Oral Daily  . sevelamer carbonate  800 mg Oral TID WC  . silver sulfADIAZINE   Topical Daily  . sodium chloride flush  3 mL Intravenous Q12H  . timolol  1 drop Both Eyes BID    Dialysis Orders: SW MWF 4h 33min 450/2x 95.5kg 2/2.25 bath P2 Hep 9000 L AVF -Parsabiv 10mg  TIW -Mircera 150 mcg IV q 2 weeks (last 5/4)  Assessment/Plan: 1. 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 Sharol Given. 2. LLE gangrene (new): Concern that was infected; BCx drawn 5/26 negative. Started on Vanc/Cefepime. Dr. Sharol Given consulted and has recommended L AKA in the future - no urgent need, not felt to be infected. 3. ESRD: Continue HD per MWF schedule- next 6/1. 4. Hypertension/volume: BP controlled, will need to lower EDW on discharge. 5. Anemia: Hgb9.0.Continue Aranesp152mcg q Monday. IV iron held. 6. Metabolic bone disease: Corr Ca slightly high and Phos low - Reduced Ca acetate binder. Continue Renvela also as binder. Parsabiv not available in hospital. Follow labs.  7. T2DM:Insulin per primary. Hypoglycemia overnight 5/25 - glucotrol stopped. 8. Twitchiness: Could be reglan EPS vs gabapentin myoclonus, follow. 9.  AMS (5/26 - 5/27): Hypoglycemia v. meds v. delirium v. L heel gangrene - improved for most part.  Veneta Penton, PA-C 08/19/2018, 11:21 AM  [facility] Pager: [phone]

## 2018-08-19 NOTE — Progress Notes (Signed)
Occupational Therapy Session Note  Patient Details  Name: Howard Bell MRN: [id number] Date of Birth: 07-11-1965  Today's Date: 08/19/2018 OT Individual Time: 1415-1530 OT Individual Time Calculation (min): 75 min    Short Term Goals: Week 2:  OT Short Term Goal 1 (Week 2): Patient will complete toileting with Min assist  OT Short Term Goal 2 (Week 2): Patient will complete toilet transfers with Min assist and appropriate DME as needed  Skilled Therapeutic Interventions/Progress Updates:    Pt received supine with c/o high pain in R residual limb however stated he had already received pain medication and an ice pack was already in place. Pt agreeable to session and transitioned to EOB. From EOB pt was assisted in eating lunch. Pt consumed the meat only from his hamburger and a juice while w/c was set up and slideboard obtained. Pt almost immediately began vomiting upon completion of his lunch. RN made aware, pt reporting no further nausea following incident and stated it likely happened "because the meat was cold". Pt assisted in changing gown and cleaning face. Pt completed slideboard transfer into w/c with CGA and set up of board. Pt propelled w/c 125 ft with (S). Pt transferred onto therapy mat with CGA using slideboard. Pt completed dynamic sitting balance throwing/catching activity with cueing for positioning. Pt then completed core stabilization activity with overhead and lateral reaching holding a 4 # dumbbell bimanually. Pt returned to w/c and propelled back to room. Pt left sitting up in w/c d/t bed not yet changed, chair alarm belt fastened.   Therapy Documentation Precautions:  Precautions Precautions: Fall Restrictions Weight Bearing Restrictions: Yes RLE Weight Bearing: Weight bearing as tolerated   Therapy/Group: Individual Therapy  Curtis Sites 08/19/2018, 4:45 PM

## 2018-08-19 NOTE — Progress Notes (Signed)
Physical Therapy Session Note  Patient Details  Name: Howard Bell MRN: [id number] Date of Birth: [birth year]  Today's Date: 08/19/2018 PT Individual Time: 0915-1010 PT Individual Time Calculation (min): 55 min   Short Term Goals: Week 1:  PT Short Term Goal 1 (Week 1): Patient to demonstrate dynamic sitting balance with S.  PT Short Term Goal 2 (Week 1): Patient to perform side board transfer with S including set up. PT Short Term Goal 3 (Week 1): Patient to propel w/c mod I on level indoor surfaces. PT Short Term Goal 4 (Week 1): Patient to perform sit to stand with CGA in prep for car transfer to SUV  Skilled Therapeutic Interventions/Progress Updates:    pt in bed, agreeable to therapy.  Pt performs sliding board transfer to w/c with CGA.  Squat pivot transfer training blocked practice with CGA to Lt, mod A to Rt. W/c mobility throughout unit with supervision, bilat UEs.  Pt performs sit <> stand and standing tolerance up to 90 seconds with RW and min A from elevated surface.  While standing pt performs Rt hip ext, hip abduction.  UE therex with 6# wt for bicep, tricep and shoulder strengthening. Pt left in w/c with needs at hand, alarm set.  Therapy Documentation Precautions:  Precautions Precautions: Fall Restrictions Weight Bearing Restrictions: Yes RLE Weight Bearing: Non weight bearing Pain: Pt c/o back pain during session, RN made aware and meds given during session   Therapy/Group: Individual Therapy  Howard Bell 08/19/2018, 11:49 AM

## 2018-08-19 NOTE — Progress Notes (Signed)
[facility] PHYSICAL MEDICINE & REHABILITATION PROGRESS NOTE  Subjective/Complaints: Patient seen sitting up in the edge of his bed this morning.  He states he slept well overnight.  Later informed by nursing regarding patient's complaint of pain.  Discussed dressing changes with nursing as well.  ROS: Denies CP, shortness of breath, nausea, vomiting, diarrhea.  Objective: Vital Signs: Blood pressure (!) 143/62, pulse 81, temperature 98.9 F (37.2 C), temperature source Oral, resp. rate 16, height 6' (1.829 m), weight 92.6 kg, SpO2 92 %. No results found. Recent Labs    08/18/18 0435 08/18/18 1133  WBC 9.4 9.9  HGB 8.9* 9.0*  HCT 28.3* 28.5*  PLT 229 243   Recent Labs    08/18/18 0435 08/18/18 1133  NA 129* 128*  K 4.1 4.4  CL 93* 91*  CO2 22 25  GLUCOSE 131* 156*  BUN 52* 55*  CREATININE 8.91* 9.19*  CALCIUM 9.3 9.3    Physical Exam: BP (!) 143/62 (BP Location: Right Arm)   Pulse 81   Temp 98.9 F (37.2 C) (Oral)   Resp 16   Ht 6' (1.829 m)   Wt 92.6 kg   SpO2 92%   BMI 27.69 kg/m  Constitutional: No distress . Vital signs reviewed. HENT: Normocephalic.  Atraumatic. Eyes: EOMI.  No discharge. Cardiovascular: No JVD. Respiratory: Normal effort. GI: Non-distended. Musc: Right AKA with edema and tenderness Neurological: Alert Motor: Bilateral upper extremities: 5/5 proximal distal, except for left digit contractures Right lower extremity: Hip flexion 4+/5, stable Left lower extremity: 4/5 proximal to distal, stable Skin:Left lower extremity with necrotic changes.  Please see media tab.  RLE: with dressing C/D/I.  Psychiatric:Flat and disengaged  Assessment/Plan: 1. 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  Physiatrist is providing close team supervision and 24 hour management of active medical problems listed  below.  Physiatrist and rehab team continue to assess barriers to discharge/monitor patient progress toward functional and medical goals  Care Tool:  Bathing  Bathing activity did not occur: Refused           Bathing assist       Upper Body Dressing/Undressing Upper body dressing   What is the patient wearing?: Hospital gown only    Upper body assist      Lower Body Dressing/Undressing Lower body dressing    Lower body dressing activity did not occur: Refused       Lower body assist       Toileting Toileting Toileting Activity did not occur (Clothing management and hygiene only): (pt did not need to toilet this session)  Toileting assist       Transfers Chair/bed transfer  Transfers assist     Chair/bed transfer assist level: Minimal Assistance - Patient > 75% Chair/bed transfer assistive device: Sliding board   Locomotion Ambulation   Ambulation assist   Ambulation activity did not occur: Safety/medical concerns          Walk 10 feet activity   Assist  Walk 10 feet activity did not occur: Safety/medical concerns        Walk 50 feet activity   Assist Walk 50 feet with 2 turns activity did not occur: Safety/medical concerns         Walk 150 feet activity   Assist Walk 150 feet activity did not occur: Safety/medical concerns  Walk 10 feet on uneven surface  activity   Assist Walk 10 feet on uneven surfaces activity did not occur: Safety/medical concerns         Wheelchair     Assist Will patient use wheelchair at discharge?: Yes Type of Wheelchair: Manual    Wheelchair assist level: Supervision/Verbal cueing Max wheelchair distance: 200'    Wheelchair 50 feet with 2 turns activity    Assist        Assist Level: Supervision/Verbal cueing   Wheelchair 150 feet activity     Assist     Assist Level: Supervision/Verbal cueing      Medical Problem List and Plan: 1.Functional and  mobility deficitssecondary to 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 Antithrombotics: -DVT/anticoagulation:Pharmaceutical:Heparin -antiplatelet therapy: N/A 3. Pain Management:             Continue gabapentin              Continue oxycodone prn.   Robaxin twice daily as needed started on 5/30 4. Mood:LCSW to follow for evaluation and support. -antipsychotic agents: N/A 5. Neuropsych: This patientis not fullycapable of making decisions on hisown behalf.  DCed gabapentin and Ultram 6. Skin/Wound Care:             Discussed with Ortho and PA previously- Silvadene dressing.  Severe PVD with limited blood flow per vascular.  Discussed with nursing  Monitor right lower extremity  Monitor left lower extremity dry gangrene 7. Fluids/Electrolytes/Nutrition:Strict I/O.  8. T2DM: Hemoglobin A1c 10.1 on 5/18.  Continue to monitor BS ac/hs and use SSI for elevated BS.   Trending up, no CBGs for this morning             Monitor with increased mobility 9. ESRD: HD MWF. Schedule atend of the day to allow participation with therapies during the day and to assistwith activity tolerance.              Recs per nephro 10. Diabetic gastroparesis: On Reglan tid ac.  Will consider decreasing if necessary 11. Anemia of chronic disease: Continue Aranesp weekly for supplement.              Hemoglobin 9.0 on 5/29             Continue to monitor 12. Glaucoma: Managed with Timoptic, Alphagan and Xalatan. 13.  Labile blood pressure  Labile on 5/30  Monitor with increased mobility 14.  Constipation immobility with opioids  Continue bowel meds 15.  SIRS  Continue IV Vanco, cefepime, Flagyl  Blood cultures no growth to date on 5/30.  LOS: 2 days A FACE TO FACE EVALUATION WAS PERFORMED  Ankit Lorie Phenix 08/19/2018, 2:31 PM

## 2018-08-19 NOTE — Progress Notes (Signed)
Howard Bell Progress Note   Subjective: Seen in room, working with OT. No CP/dyspnea or other c/o today.   Objective Vitals:   08/18/18 2043 08/18/18 2045 08/19/18 0511 08/19/18 1336  BP: (!) 134/34 (!) 155/54 (!) 136/44 (!) 143/62  Pulse: 96 96 87 81  Resp: 16  16   Temp: 99.3 F (37.4 C)  99 F (37.2 C) 98.9 F (37.2 C)  TempSrc: Oral  Oral Oral  SpO2: 91% 94% 92% 92%  Weight:      Height:       Physical Exam General:Chronically ill appearing man, NAD. Aware/alert. Heart:RRR; 2/6 murmur Lungs:CTAB Abdomen:soft Extremities:R AKA bandaged; LLE without edema; scattered wounds - large wound bandaged/not examined (see photos under "media") Dialysis Access:LUE AVF + thrill  Additional Objective Labs: Basic Metabolic Panel: Recent Labs  Lab 08/14/18 2356  08/17/18 0416 08/18/18 0435 08/18/18 1133  NA 129*   < > 130* 129* 128*  K 4.5   < > 4.0 4.1 4.4  CL 89*   < > 94* 93* 91*  CO2 24   < > 24 22 25   GLUCOSE 136*   < > 168* 131* 156*  BUN 103*   < > 33* 52* 55*  CREATININE 13.65*   < > 6.36* 8.91* 9.19*  CALCIUM 9.3   < > 8.8* 9.3 9.3  PHOS 3.8  --   --   --  2.9   < > = values in this interval not displayed.   Liver Function Tests: Recent Labs  Lab 08/15/18 1608 08/16/18 0416 08/18/18 0435 08/18/18 1133  AST 18 17 14*  --   ALT 8 8 8   --   ALKPHOS 118 112 101  --   BILITOT 0.4 0.6 0.6  --   PROT 7.5 7.7 7.9  --   ALBUMIN 2.3* 2.2* 2.3* 2.2*   CBC: Recent Labs  Lab 08/15/18 1525 08/16/18 0416 08/17/18 0416 08/18/18 0435 08/18/18 1133  WBC 9.9 10.2 10.7* 9.4 9.9  NEUTROABS 7.0  --   --  5.9  --   HGB 8.5* 8.7* 8.5* 8.9* 9.0*  HCT 27.2* 27.7* 26.9* 28.3* 28.5*  MCV 91.0 90.8 91.2 90.7 91.1  PLT 194 209 220 229 243   CBG: Recent Labs  Lab 08/17/18 2235 08/18/18 0610 08/18/18 1149 08/18/18 2123 08/19/18 0623  GLUCAP 269* 125* 140* 157* 219*   Medications: . ceFEPime (MAXIPIME) IV 2 g (08/18/18 2301)  . metronidazole  500 mg (08/19/18 1317)  . vancomycin 1,000 mg (08/18/18 1844)   . brimonidine  1 drop Both Eyes BID  . calcium acetate  1,334 mg Oral TID WC  . [START ON 08/21/2018] darbepoetin (ARANESP) injection - DIALYSIS  150 mcg Intravenous Q Mon-HD  . docusate sodium  100 mg Oral BID  . fluticasone  1 spray Each Nare Daily  . heparin  5,000 Units Subcutaneous Q8H  . latanoprost  1 drop Both Eyes QHS  . metoCLOPramide  5 mg Oral BID AC  . metoprolol succinate  50 mg Oral QHS  . pantoprazole  40 mg Oral Daily  . sevelamer carbonate  800 mg Oral TID WC  . silver sulfADIAZINE   Topical Daily  . sodium chloride flush  3 mL Intravenous Q12H  . timolol  1 drop Both Eyes BID    Dialysis Orders: SW MWF 4h 76min 450/2x 95.5kg 2/2.25 bath P2 Hep 9000 L AVF -Parsabiv 10mg  TIW -Mircera 150 mcg IV q 2 weeks (last 5/4)  Assessment/Plan:  1. 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 Sharol Given. 2. LLE gangrene (new): Concern that was infected; BCx drawn 5/26 negative. Started on Vanc/Cefepime. Dr. Sharol Given consulted and has recommended L AKA in the future - no urgent need, not felt to be infected. 3. ESRD: Continue HD per MWF schedule- next 6/1. 4. Hypertension/volume: BP controlled, will need to lower EDW on discharge. 5. Anemia: Hgb9.0.Continue Aranesp128mcg q Monday. IV iron held. 6. Metabolic bone disease: Corr Ca slightly high and Phos low - Reduced Ca acetate binder. Continue Renvela also as binder. Parsabiv not available in hospital. Follow labs.  7. T2DM:Insulin per primary. Hypoglycemia overnight 5/25 - glucotrol stopped. 8. Twitchiness: Could be reglan EPS vs gabapentin myoclonus, follow. 9.  AMS (5/26 - 5/27): Hypoglycemia v. meds v. delirium v. L heel gangrene - improved for most part.  Veneta Penton, PA-C 08/19/2018, 5:04 PM  [facility] Pager: [phone]  Pt seen, examined and agree w A/P as above.  Kelly Splinter  MD 08/19/2018, 5:04 PM

## 2018-08-19 NOTE — Plan of Care (Signed)
  Problem: RH SKIN INTEGRITY Goal: RH STG SKIN FREE OF INFECTION/BREAKDOWN Outcome: Not Progressing; gangrene LLE;   Problem: RH PAIN MANAGEMENT Goal: RH STG PAIN MANAGED AT OR BELOW PT'S PAIN GOAL Outcome: Not Progressing; c/o pain 10/10 rt aka

## 2018-08-19 NOTE — Progress Notes (Signed)
Speech Language Pathology Daily Session Note  Patient Details  Name: Howard Bell MRN: [id number] Date of Birth: [birth year]  Today's Date: 08/19/2018 SLP Individual Time: 0211-1552 SLP Individual Time Calculation (min): 60 min  Short Term Goals: Week 1: SLP Short Term Goal 1 (Week 1): STGs=LTGs  Skilled Therapeutic Interventions: Skilled treatment session focused on cognitive goals. SLP facilitated session by providing extra time and Mod A verbal and visual cues for functional problem solving during a basic money management task. Patient also required overall supervision level verbal cues for organization and problem solving with a basic medication management task. Throughout session, patient was distracted by pain but insisted on participating in treatment session requiring overall Min-Mod A verbal cues for sustained attention. Patient was transferred back to bed at end of session to maximize comfort. Patient left upright in bed with RN present. Continue with current plan of care.      Pain Pain Assessment Pain Scale: 0-10 Pain Score: 10-Worst pain ever Pain Type: Surgical pain Pain Location: Leg Pain Orientation: Right Pain Descriptors / Indicators: Aching Pain Frequency: Intermittent Pain Onset: On-going Pain Intervention(s): Medication (See eMAR)  Therapy/Group: Individual Therapy  Chantea Surace 08/19/2018, 12:12 PM

## 2018-08-20 DIAGNOSIS — G8918 Other acute postprocedural pain: Secondary | ICD-10-CM

## 2018-08-20 LAB — GLUCOSE, CAPILLARY
Glucose-Capillary: 128 mg/dL — ABNORMAL HIGH (ref 70–99)
Glucose-Capillary: 165 mg/dL — ABNORMAL HIGH (ref 70–99)
Glucose-Capillary: 203 mg/dL — ABNORMAL HIGH (ref 70–99)
Glucose-Capillary: 174 mg/dL — ABNORMAL HIGH (ref 70–99)

## 2018-08-20 LAB — CULTURE, BLOOD (ROUTINE X 2)
Culture: NO GROWTH
Culture: NO GROWTH
Special Requests: ADEQUATE
Special Requests: ADEQUATE

## 2018-08-20 MED ORDER — CHLORHEXIDINE GLUCONATE CLOTH 2 % EX PADS
6.0000 | MEDICATED_PAD | Freq: Every day | CUTANEOUS | Status: DC
  Administered 2018-08-27: 6 via TOPICAL

## 2018-08-20 NOTE — Plan of Care (Signed)
  Problem: RH BOWEL ELIMINATION Goal: RH STG MANAGE BOWEL WITH ASSISTANCE Description STG Manage Bowel with Assistance. Outcome: Progressing   Problem: RH SKIN INTEGRITY Goal: RH STG SKIN FREE OF INFECTION/BREAKDOWN Outcome: Progressing

## 2018-08-20 NOTE — Progress Notes (Signed)
Pharmacy Antibiotic Note  Howard Bell is a 53 y.o. 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 The patient was transferred to [facility] 5/28. Pharmacy consulted to resume Vancomycin and Cefepime dosing.   The patient has been on Vancomycin + Cefepime since 5/26 and is ESRD-MWF.  The patient last received a dose of Vancomycin after HD on 5/27, next dose due with HD on 5/29.  Plan: - Continue Vancomycin 1g/HD-MWF - Continue Cefepime 2g on MWF @ 2000 - Will continue to follow HD schedule/duration, culture results, LOT, and antibiotic de-escalation plans   Height: 6' (182.9 cm) Weight: 204 lb 2.3 oz (92.6 kg) IBW/kg (Calculated) : 77.6  Temp (24hrs), Avg:98.5 F (36.9 C), Min:97.7 F (36.5 C), Max:98.9 F (37.2 C)  Recent Labs  Lab 08/15/18 1525 08/15/18 1608 08/15/18 2101 08/16/18 0416 08/17/18 0416 08/18/18 0435 08/18/18 1133  WBC 9.9  --   --  10.2 10.7* 9.4 9.9  CREATININE  --  8.40*  --  9.47* 6.36* 8.91* 9.19*  LATICACIDVEN  --  1.5 1.0  --   --   --   --     Estimated Creatinine Clearance: 10.4 mL/min (A) (by C-G formula based on SCr of 9.19 mg/dL (H)).    No Known Allergies  Antimicrobials this admission: Doxy 5/19 >>5/26 (at CIR) Vanco 5/26 >> Cefepime 5/26 >> Flagyl IV 5/27>>  Microbiology results: 5/15: COVID neg  Thank you for allowing pharmacy to be a part of this patient's care.  Minda Ditto PharmD Clinical Pharmacist Clinical phone for 08/20/2018: [phone] 08/20/2018 1:18 PM   **Pharmacist phone directory can now be found on [email or web address] ([email or web address]).  Listed under [place].

## 2018-08-20 NOTE — Progress Notes (Addendum)
[facility] Bell Howard Bell Progress Note   Subjective:  Seen in room - no new complaints. No CP/dyspnea.  Objective Vitals:   08/19/18 0511 08/19/18 1336 08/19/18 2101 08/20/18 0600  BP: (!) 136/44 (!) 143/62 (!) 144/63 (!) 143/70  Pulse: 87 81 90 90  Resp: 16  18 18   Temp: 99 F (37.2 C) 98.9 F (37.2 C) 97.7 F (36.5 C) 98.8 F (37.1 C)  TempSrc: Oral Oral Axillary Oral  SpO2: 92% 92% 91% 90%  Weight:      Height:       Physical Exam General:Chronically ill appearing man, NAD. Aware/alert. Heart:RRR; 2/6 murmur Lungs:CTAB Abdomen:soft Extremities:R AKA bandaged; LLE without edema; scattered wounds - large wound bandaged/not examined (see photos under "media") Dialysis Access:LUE AVF + thrill  Additional Objective Labs: Basic Metabolic Panel: Recent Labs  Lab 08/14/18 2356  08/17/18 0416 08/18/18 0435 08/18/18 1133  NA 129*   < > 130* 129* 128*  K 4.5   < > 4.0 4.1 4.4  CL 89*   < > 94* 93* 91*  CO2 24   < > 24 22 25   GLUCOSE 136*   < > 168* 131* 156*  BUN 103*   < > 33* 52* 55*  CREATININE 13.65*   < > 6.36* 8.91* 9.19*  CALCIUM 9.3   < > 8.8* 9.3 9.3  PHOS 3.8  --   --   --  2.9   < > = values in this interval not displayed.   Liver Function Tests: Recent Labs  Lab 08/15/18 1608 08/16/18 0416 08/18/18 0435 08/18/18 1133  AST 18 17 14*  --   ALT 8 8 8   --   ALKPHOS 118 112 101  --   BILITOT 0.4 0.6 0.6  --   PROT 7.5 7.7 7.9  --   ALBUMIN 2.3* 2.2* 2.3* 2.2*   CBC: Recent Labs  Lab 08/15/18 1525 08/16/18 0416 08/17/18 0416 08/18/18 0435 08/18/18 1133  WBC 9.9 10.2 10.7* 9.4 9.9  NEUTROABS 7.0  --   --  5.9  --   HGB 8.5* 8.7* 8.5* 8.9* 9.0*  HCT 27.2* 27.7* 26.9* 28.3* 28.5*  MCV 91.0 90.8 91.2 90.7 91.1  PLT 194 209 220 229 243   CBG: Recent Labs  Lab 08/18/18 2123 08/19/18 0623 08/19/18 2151 08/20/18 0658 08/20/18 1130  GLUCAP 157* 219* 138* 128* 165*   Medications: . ceFEPime (MAXIPIME) IV 2 g (08/18/18 2301)  .  metronidazole 500 mg (08/20/18 0530)  . vancomycin 1,000 mg (08/18/18 1844)   . brimonidine  1 drop Both Eyes BID  . calcium acetate  1,334 mg Oral TID WC  . [START ON 08/21/2018] darbepoetin (ARANESP) injection - DIALYSIS  150 mcg Intravenous Q Mon-HD  . docusate sodium  100 mg Oral BID  . fluticasone  1 spray Each Nare Daily  . heparin  5,000 Units Subcutaneous Q8H  . latanoprost  1 drop Both Eyes QHS  . metoCLOPramide  5 mg Oral BID AC  . metoprolol succinate  50 mg Oral QHS  . pantoprazole  40 mg Oral Daily  . sevelamer carbonate  800 mg Oral TID WC  . silver sulfADIAZINE   Topical Daily  . sodium chloride flush  3 mL Intravenous Q12H  . timolol  1 drop Both Eyes BID    Dialysis Orders: SW MWF 4h 92min 450/2x 95.5kg 2/2.25 bath P2 Hep 9000 L AVF -Parsabiv 10mg  TIW -Mircera 150 mcg IV q 2 weeks (last 5/4)  Assessment/Plan:  1. 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 Sharol Given. 2. LLE gangrene (new): Concern that was infected; BCx drawn 5/26 negative. Started on Vanc/Cefepime. Dr. Sharol Given consulted and has recommended L AKA in the future - no urgent need, not felt to be infected. 3. ESRD: Continue HD per MWF schedule-next 6/1. 4. Hypertension/volume: BP controlled, will need to lower EDW on discharge. 5. Anemia: Hgb9.0.Continue Aranesp12mcg q Monday. IV iron held. 6. Metabolic bone disease: Corr Ca slightly high and Phos low - Reduced Ca acetate binder.Continue Renvela also as binder. Parsabiv not available in hospital. Follow labs.  7. T2DM:Insulin per primary. Hypoglycemia overnight 5/25 - glucotrol stopped. 8. Twitchiness: Improved with holding gabapentin. 9. AMS (5/26 - 5/27): Hypoglycemia v. meds v. delirium v. L heel gangrene - improved for most part.  Veneta Penton, PA-C 08/20/2018, 12:10 PM  [facility] Pager: [phone]  Pt seen, examined and agree w A/P as above.  Kelly Splinter  MD 08/20/2018, 12:58 PM

## 2018-08-20 NOTE — IPOC Note (Signed)
Patient in interrupted stay.  Please see previous IPOC.

## 2018-08-20 NOTE — Progress Notes (Signed)
[facility] PHYSICAL MEDICINE & REHABILITATION PROGRESS NOTE  Subjective/Complaints: Patient seen sitting up at the edge of his bed and then transferring to his chair this morning.  He states he slept well overnight.  Per nursing patient with pain overnight, however patient states it was relieved with medications.  Discussed CBGs with nursing.  ROS: Denies CP, shortness of breath, nausea, vomiting, diarrhea.  Objective: Vital Signs: Blood pressure (!) 143/70, pulse 90, temperature 98.8 F (37.1 C), temperature source Oral, resp. rate 18, height 6' (1.829 m), weight 92.6 kg, SpO2 90 %. No results found. Recent Labs    08/18/18 0435 08/18/18 1133  WBC 9.4 9.9  HGB 8.9* 9.0*  HCT 28.3* 28.5*  PLT 229 243   Recent Labs    08/18/18 0435 08/18/18 1133  NA 129* 128*  K 4.1 4.4  CL 93* 91*  CO2 22 25  GLUCOSE 131* 156*  BUN 52* 55*  CREATININE 8.91* 9.19*  CALCIUM 9.3 9.3    Physical Exam: BP (!) 143/70 (BP Location: Right Wrist)   Pulse 90   Temp 98.8 F (37.1 C) (Oral)   Resp 18   Ht 6' (1.829 m)   Wt 92.6 kg   SpO2 90%   BMI 27.69 kg/m  Constitutional: No distress . Vital signs reviewed. HENT: Normocephalic.  Atraumatic. Eyes: EOMI.  No discharge. Cardiovascular: No JVD. Respiratory: Normal effort. GI: Non-distended. Musc: Right AKA with edema and tenderness Neurological: Alert Motor: Bilateral upper extremities: 5/5 proximal distal, except for left digit contractures Right lower extremity: Hip flexion 4+/5, unchanged Left lower extremity: 4/5 proximal to distal, unchanged Skin:Left lower extremity with necrotic changes.  Please see media tab.  RLE: with dressing C/D/I.  Psychiatric:Flat  Assessment/Plan: 1. 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  Physiatrist is providing close team supervision and 24 hour  management of active medical problems listed below.  Physiatrist and rehab team continue to assess barriers to discharge/monitor patient progress toward functional and medical goals  Care Tool:  Bathing  Bathing activity did not occur: Refused           Bathing assist       Upper Body Dressing/Undressing Upper body dressing   What is the patient wearing?: Hospital gown only    Upper body assist      Lower Body Dressing/Undressing Lower body dressing    Lower body dressing activity did not occur: Refused       Lower body assist       Toileting Toileting Toileting Activity did not occur (Clothing management and hygiene only): (pt did not need to toilet this session)  Toileting assist Assist for toileting: Maximal Assistance - Patient 25 - 49%     Transfers Chair/bed transfer  Transfers assist     Chair/bed transfer assist level: Minimal Assistance - Patient > 75% Chair/bed transfer assistive device: Programmer, multimedia   Ambulation assist   Ambulation activity did not occur: Safety/medical concerns          Walk 10 feet activity   Assist  Walk 10 feet activity did not occur: Safety/medical concerns        Walk 50 feet activity   Assist Walk 50 feet with 2 turns activity did not occur: Safety/medical concerns         Walk 150 feet activity   Assist Walk 150 feet  activity did not occur: Safety/medical concerns         Walk 10 feet on uneven surface  activity   Assist Walk 10 feet on uneven surfaces activity did not occur: Safety/medical concerns         Wheelchair     Assist Will patient use wheelchair at discharge?: Yes Type of Wheelchair: Manual    Wheelchair assist level: Supervision/Verbal cueing Max wheelchair distance: 200'    Wheelchair 50 feet with 2 turns activity    Assist        Assist Level: Supervision/Verbal cueing   Wheelchair 150 feet activity     Assist     Assist  Level: Supervision/Verbal cueing      Medical Problem List and Plan: 1.Functional and mobility deficitssecondary to 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 Antithrombotics: -DVT/anticoagulation:Pharmaceutical:Heparin -antiplatelet therapy: N/A 3. Pain Management:             Continue gabapentin              Continue oxycodone prn.   Robaxin twice daily as needed started on 5/30  Improving 4. Mood:LCSW to follow for evaluation and support. -antipsychotic agents: N/A 5. Neuropsych: This patientis not fullycapable of making decisions on hisown behalf.  DCed gabapentin and Ultram 6. Skin/Wound Care:             Discussed with Ortho and PA previously- Silvadene dressing.  Severe PVD with limited blood flow per vascular.  Discussed with nursing  Monitor right lower extremity  Monitor left lower extremity dry gangrene 7. Fluids/Electrolytes/Nutrition:Strict I/O.  8. T2DM: Hemoglobin A1c 10.1 on 5/18.  Continue to monitor BS ac/hs and use SSI for elevated BS.   Labile on 5/31             Monitor with increased mobility 9. ESRD: HD MWF. Schedule atend of the day to allow participation with therapies during the day and to assistwith activity tolerance.              Recs per nephro 10. Diabetic gastroparesis: On Reglan tid ac.  Will consider decreasing if necessary 11. Anemia of chronic disease: Continue Aranesp weekly for supplement.              Hemoglobin 9.0 on 5/29             Continue to monitor 12. Glaucoma: Managed with Timoptic, Alphagan and Xalatan. 13.  Labile blood pressure  Elevated on 5/31  Monitor with increased mobility 14.  Constipation immobility with opioids  Continue bowel meds 15.  SIRS  Continue IV Vanc, cefepime, Flagyl  Blood cultures no growth to date on 5/30.  LOS: 3 days A FACE TO FACE EVALUATION WAS PERFORMED  Ankit Lorie Phenix 08/20/2018, 1:52 PM

## 2018-08-20 NOTE — Plan of Care (Signed)
  Problem: RH SKIN INTEGRITY Goal: RH STG SKIN FREE OF INFECTION/BREAKDOWN Outcome: Not Progressing; necrotic left lower ext

## 2018-08-21 ENCOUNTER — Inpatient Hospital Stay (HOSPITAL_COMMUNITY): Payer: Medicare Other | Admitting: Occupational Therapy

## 2018-08-21 ENCOUNTER — Inpatient Hospital Stay (HOSPITAL_COMMUNITY): Payer: Medicare Other

## 2018-08-21 ENCOUNTER — Inpatient Hospital Stay (HOSPITAL_COMMUNITY): Payer: Medicare Other | Admitting: Speech Pathology

## 2018-08-21 DIAGNOSIS — R7309 Other abnormal glucose: Secondary | ICD-10-CM

## 2018-08-21 DIAGNOSIS — E1129 Type 2 diabetes mellitus with other diabetic kidney complication: Secondary | ICD-10-CM | POA: Diagnosis not present

## 2018-08-21 DIAGNOSIS — N186 End stage renal disease: Secondary | ICD-10-CM | POA: Diagnosis not present

## 2018-08-21 DIAGNOSIS — Z992 Dependence on renal dialysis: Secondary | ICD-10-CM | POA: Diagnosis not present

## 2018-08-21 LAB — GLUCOSE, CAPILLARY
Glucose-Capillary: 147 mg/dL — ABNORMAL HIGH (ref 70–99)
Glucose-Capillary: 131 mg/dL — ABNORMAL HIGH (ref 70–99)
Glucose-Capillary: 144 mg/dL — ABNORMAL HIGH (ref 70–99)

## 2018-08-21 NOTE — Progress Notes (Signed)
Speech Language Pathology Daily Session Note  Patient Details  Name: Howard Bell MRN: [id number] Date of Birth: 01/05/1966  Today's Date: 08/21/2018 SLP Individual Time: 2429-9806 SLP Individual Time Calculation (min): 55 min  Short Term Goals: Week 1: SLP Short Term Goal 1 (Week 1): STGs=LTGs  Skilled Therapeutic Interventions: Skilled treatment session focused on cognitive goals. SLP facilitated session by providing Min verbal cues for recall of his current medications and their functions. SLP also gave the patient a calendar so that he could record his blood sugar before every meal (like he did at home). Throughout session, patient with intermittent confusion and required Min verbal cues for sustained attention to tasks. Patient left upright in wheelchair with all needs within reach and alarm on. Continue with current plan of care.      Pain Pain Assessment Pain Score: 0-No pain  Therapy/Group: Individual Therapy  Howard Bell 08/21/2018, 12:34 PM

## 2018-08-21 NOTE — Progress Notes (Signed)
Occupational Therapy Session Note  Patient Details  Name: Howard Bell MRN: [id number] Date of Birth: 09-10-1965  Today's Date: 08/21/2018 OT Individual Time: 1050-1200 OT Individual Time Calculation (min): 70 min    Short Term Goals: Week 1:    Week 2:  OT Short Term Goal 1 (Week 2): Patient will complete toileting with Min assist  OT Short Term Goal 2 (Week 2): Patient will complete toilet transfers with Min assist and appropriate DME as needed  Skilled Therapeutic Interventions/Progress Updates:    Pt received sitting in w/c. He stated that he washed up earlier this am.  He declined getting dressed as he has dialysis today. He did change gowns.  Pt's processing was slower today, but he did participate well.  He was agreeable to exercises from the w/c.   Pt worked on 4 sets of 12-15 reps: 3 lb bicep curls Level 2 orange band for horizontal and overhead arm extensions, tricep extensions, and single arm rows AROM of shoulders with arm circles 10 forward and 10 back with arms to side and then in front.   1 set of lateral leans with alternating pushes using arms on arm rests.    Pt was sleepy today and often nodded off.  Belt alarm on and all needs met.     Therapy Documentation Precautions:  Precautions Precautions: Fall Restrictions Weight Bearing Restrictions: Yes RLE Weight Bearing: Non weight bearing     Pain: Pain Assessment Pain Score: 0-No pain  Therapy/Group: Individual Therapy  SAGUIER,JULIA 08/21/2018, 11:47 AM

## 2018-08-21 NOTE — Progress Notes (Signed)
[facility] PHYSICAL MEDICINE & REHABILITATION PROGRESS NOTE  Subjective/Complaints: Patient seen laying in bed this morning.  He states he slept well overnight.  He has questions regarding timing of HD today.  ROS: Denies CP, shortness of breath, nausea, vomiting, diarrhea.  Objective: Vital Signs: Blood pressure (!) 151/67, pulse 77, temperature 99.2 F (37.3 C), temperature source Oral, resp. rate 18, height 6' (1.829 m), weight 97.3 kg, SpO2 94 %. No results found. Recent Labs    08/18/18 1133  WBC 9.9  HGB 9.0*  HCT 28.5*  PLT 243   Recent Labs    08/18/18 1133  NA 128*  K 4.4  CL 91*  CO2 25  GLUCOSE 156*  BUN 55*  CREATININE 9.19*  CALCIUM 9.3    Physical Exam: BP (!) 151/67 (BP Location: Right Wrist)   Pulse 77   Temp 99.2 F (37.3 C) (Oral)   Resp 18   Ht 6' (1.829 m)   Wt 97.3 kg   SpO2 94%   BMI 29.09 kg/m  Constitutional: No distress . Vital signs reviewed. HENT: Normocephalic.  Atraumatic. Eyes: EOMI.  No discharge. Cardiovascular: No JVD. Respiratory: Normal effort. GI: Non-distended. Musc: Right AKA with edema and tenderness Neurological: Alert Motor: Bilateral upper extremities: 5/5 proximal distal, except for left digit contractures Right lower extremity: Hip flexion 4+/5, stable Left lower extremity: 4/5 proximal to distal, stable Skin:Left lower extremity with necrotic changes.  Please see media tab.  RLE: with dressing C/D/I.  Psychiatric:Flat  Assessment/Plan: 1. 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  Physiatrist is providing close team supervision and 24 hour management of active medical problems listed below.  Physiatrist and rehab team continue to assess barriers to discharge/monitor patient progress toward functional and medical goals  Care Tool:  Bathing  Bathing activity  did not occur: Refused           Bathing assist       Upper Body Dressing/Undressing Upper body dressing   What is the patient wearing?: Hospital gown only    Upper body assist      Lower Body Dressing/Undressing Lower body dressing    Lower body dressing activity did not occur: Refused       Lower body assist       Toileting Toileting Toileting Activity did not occur (Clothing management and hygiene only): (pt did not need to toilet this session)  Toileting assist Assist for toileting: Maximal Assistance - Patient 25 - 49%     Transfers Chair/bed transfer  Transfers assist     Chair/bed transfer assist level: Minimal Assistance - Patient > 75% Chair/bed transfer assistive device: Programmer, multimedia   Ambulation assist   Ambulation activity did not occur: Safety/medical concerns          Walk 10 feet activity   Assist  Walk 10 feet activity did not occur: Safety/medical concerns        Walk 50 feet activity   Assist Walk 50 feet with 2 turns activity did not occur: Safety/medical concerns         Walk 150 feet activity   Assist Walk 150 feet activity did not occur: Safety/medical concerns         Walk 10 feet on uneven surface  activity   Assist Walk 10 feet on uneven surfaces activity did not occur: Safety/medical concerns  Wheelchair     Assist Will patient use wheelchair at discharge?: Yes Type of Wheelchair: Manual    Wheelchair assist level: Supervision/Verbal cueing Max wheelchair distance: 200'    Wheelchair 50 feet with 2 turns activity    Assist        Assist Level: Supervision/Verbal cueing   Wheelchair 150 feet activity     Assist     Assist Level: Supervision/Verbal cueing      Medical Problem List and Plan: 1.Functional and mobility deficitssecondary to 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  Antithrombotics: -DVT/anticoagulation:Pharmaceutical:Heparin -antiplatelet therapy: N/A 3. Pain Management:             Continue gabapentin              Continue oxycodone prn.   Robaxin twice daily as needed started on 5/30  Improving 4. Mood:LCSW to follow for evaluation and support. -antipsychotic agents: N/A 5. Neuropsych: This patientis not fullycapable of making decisions on hisown behalf.  DCed gabapentin and Ultram 6. Skin/Wound Care:             Discussed with Ortho and PA previously- Silvadene dressing.  Severe PVD with limited blood flow per vascular.  Discussed with nursing  Monitor right lower extremity  Monitor left lower extremity dry gangrene 7. Fluids/Electrolytes/Nutrition:Strict I/O.  8. T2DM: Hemoglobin A1c 10.1 on 5/18.  Continue to monitor BS ac/hs and use SSI for elevated BS.   Labile on 6/1             Monitor with increased mobility 9. ESRD: HD MWF. Schedule atend of the day to allow participation with therapies during the day and to assistwith activity tolerance.              Recs per nephro 10. Diabetic gastroparesis: On Reglan tid ac.  Will consider decreasing if necessary 11. Anemia of chronic disease: Continue Aranesp weekly for supplement.              Hemoglobin 9.0 on 5/29             Continue to monitor 12. Glaucoma: Managed with Timoptic, Alphagan and Xalatan. 13.  Labile blood pressure  Labile on 6/1  Monitor with increased mobility 14.  Constipation immobility with opioids  Continue bowel meds 15.  SIRS  Continue IV Vanc, cefepime, Flagyl  Blood cultures no growth.  LOS: 4 days A FACE TO FACE EVALUATION WAS PERFORMED  Yashar Inclan Lorie Phenix 08/21/2018, 11:18 AM

## 2018-08-21 NOTE — Progress Notes (Signed)
Physical Therapy Session Note  Patient Details  Name: Howard Bell MRN: [id number] Date of Birth: 1966-02-02  Today's Date: 08/21/2018 PT Individual Time: 0830-0930 PT Individual Time Calculation (min): 60 min   Short Term Goals: Week 1:  PT Short Term Goal 1 (Week 1): Patient to demonstrate dynamic sitting balance with S.  PT Short Term Goal 2 (Week 1): Patient to perform side board transfer with S including set up. PT Short Term Goal 3 (Week 1): Patient to propel w/c mod I on level indoor surfaces. PT Short Term Goal 4 (Week 1): Patient to perform sit to stand with CGA in prep for car transfer to SUV  Skilled Therapeutic Interventions/Progress Updates:    Patient in supine min A to sit due to nausea upon entry.  Improved sitting and sat EOB no UE support to eat breakfast.  Discussed d/c plans and pt hopeful for d/c soon. Sit to supine with S and performed LE therex including R hip flexion, hip abduction and L single leg bridge.  Re-wrapped R with ace bandage due to coming off.  Supine to sit S.  Patient transferd to w/c via slide board with CGA and A for w/c set up for propelled x 150'.  Sit to stand to RW and pivot to mat min A unsafe technique sitting prior to close enough to mat.  Seated for ball toss for sitting balance work and upper body strength.  Seated for 2 x 20 reps bicep curls with 6-7# weights.  Patient sit to stand to walker and pivot on L foot to w/c min A cues for safety stand to sit.  Propelled to room and handoff to SLP.   Therapy Documentation Precautions:  Precautions Precautions: Fall Restrictions Weight Bearing Restrictions: Yes RLE Weight Bearing: Non weight bearing Pain: Pain Assessment Pain Score: 0-No pain    Therapy/Group: Individual Therapy  Reginia Naas  Magda Kiel, PT 08/21/2018, 8:39 AM

## 2018-08-21 NOTE — Progress Notes (Signed)
Was  Informed by asisgned NT(Jessie) that after assisting patient from [facility] and with peri-care,she noted that his stool was large and appears to have had some bloody streaks, no additional blood or bleeding observed from rectum while completing peri-care. Patient states that he' was staining "because he was constipated, encourage patient to try and take prescribe medications for constipation as ordered -prn, states he understand,

## 2018-08-21 NOTE — Progress Notes (Signed)
[facility] Progress Note   Subjective:  Seen in room - no new complaints.  Objective Vitals:   08/20/18 0600 08/20/18 1520 08/20/18 2115 08/21/18 0625  BP: (!) 143/70 (!) 163/64 (!) 150/64 (!) 151/67  Pulse: 90 87 89 77  Resp: 18  18   Temp: 98.8 F (37.1 C) 98.5 F (36.9 C) 98.4 F (36.9 C) 99.2 F (37.3 C)  TempSrc: Oral Oral Oral Oral  SpO2: 90% 96% 98% 94%  Weight:    97.3 kg  Height:       Physical Exam General:Chronically ill appearing man, NAD. Aware/alert. Sitting up in [facility] Heart:RRR; 2/6 murmur Lungs:CTAB Abdomen:soft Extremities:R AKA bandaged; LLE 1+ pitting  edema; scattered wounds - large wound bandaged/not examined (see photos under "media") Dialysis Access:LUE AVF + thrill  Additional Objective Labs: Basic Metabolic Panel: Recent Labs  Lab 08/14/18 2356  08/17/18 0416 08/18/18 0435 08/18/18 1133  NA 129*   < > 130* 129* 128*  K 4.5   < > 4.0 4.1 4.4  CL 89*   < > 94* 93* 91*  CO2 24   < > 24 22 25   GLUCOSE 136*   < > 168* 131* 156*  BUN 103*   < > 33* 52* 55*  CREATININE 13.65*   < > 6.36* 8.91* 9.19*  CALCIUM 9.3   < > 8.8* 9.3 9.3  PHOS 3.8  --   --   --  2.9   < > = values in this interval not displayed.   Liver Function Tests: Recent Labs  Lab 08/15/18 1608 08/16/18 0416 08/18/18 0435 08/18/18 1133  AST 18 17 14*  --   ALT 8 8 8   --   ALKPHOS 118 112 101  --   BILITOT 0.4 0.6 0.6  --   PROT 7.5 7.7 7.9  --   ALBUMIN 2.3* 2.2* 2.3* 2.2*   CBC: Recent Labs  Lab 08/15/18 1525 08/16/18 0416 08/17/18 0416 08/18/18 0435 08/18/18 1133  WBC 9.9 10.2 10.7* 9.4 9.9  NEUTROABS 7.0  --   --  5.9  --   HGB 8.5* 8.7* 8.5* 8.9* 9.0*  HCT 27.2* 27.7* 26.9* 28.3* 28.5*  MCV 91.0 90.8 91.2 90.7 91.1  PLT 194 209 220 229 243   CBG: Recent Labs  Lab 08/20/18 0658 08/20/18 1130 08/20/18 1629 08/20/18 2141 08/21/18 0656  GLUCAP 128* 165* 203* 174* 147*   Medications: . ceFEPime (MAXIPIME) IV 2 g (08/18/18  2301)  . metronidazole Stopped (08/21/18 0851)  . vancomycin 1,000 mg (08/18/18 1844)   . brimonidine  1 drop Both Eyes BID  . calcium acetate  1,334 mg Oral TID WC  . Chlorhexidine Gluconate Cloth  6 each Topical Q0600  . darbepoetin (ARANESP) injection - DIALYSIS  150 mcg Intravenous Q Mon-HD  . docusate sodium  100 mg Oral BID  . fluticasone  1 spray Each Nare Daily  . heparin  5,000 Units Subcutaneous Q8H  . latanoprost  1 drop Both Eyes QHS  . metoCLOPramide  5 mg Oral BID AC  . metoprolol succinate  50 mg Oral QHS  . pantoprazole  40 mg Oral Daily  . sevelamer carbonate  800 mg Oral TID WC  . silver sulfADIAZINE   Topical Daily  . sodium chloride flush  3 mL Intravenous Q12H  . timolol  1 drop Both Eyes BID    Dialysis Orders: SW MWF 4h 89min 450/2x 95.5kg 2/2.25 bath P2 Hep 9000 L AVF -Parsabiv 10mg  TIW -  Mircera 150 mcg IV q 2 weeks (last 5/4)  Assessment/Plan: 1. 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 Sharol Given. 2. LLE gangrene (new): Concern that was infected; BCx drawn 5/26 negative. Started on Vanc/Cefepime. Dr. Sharol Given consulted and has recommended L AKA in the future - no urgent need, not felt to be infected. 3. ESRD: Continue HD per MWF schedule-next 6/1. 4. Hypertension/volume: BP controlled, will need to lower EDW on discharge. Increase goal on HD today 2.5 - 3 L today 5. Anemia: Hgb9.0.Continue Aranesp124mcg q Monday. tsat 22% 5/22/ferritin 1300s - likely inflammatory- had 2 doses ferrlicit 6. Metabolic bone disease: Corr Ca slightly high and Phos low - Reduced Ca acetate binder.to 2 ac 5/30 -if Ca still low today - will reduce further Continue Renvela also as binder. Parsabiv not available in hospital. Follow labs.  7. T2DM:Insulin per primary. Hypoglycemia overnight 5/25 - glucotrol stopped. 8. Twitchiness: Improved with holding gabapentin. 9. AMS (5/26 - 5/27): Hypoglycemia v. meds v. delirium v. L heel gangrene - improved for most part. Seems  more engagin  than when I saw him a week ago.  Iran Ouch, PA-C 08/21/2018, 9:57 AM  [facility] Pager: [phone]

## 2018-08-21 NOTE — Progress Notes (Signed)
Notified Hemodialysis department because patient verbalized concern as to" when he would be dialyzed", writer spoke with RN Lesly Rubenstein) and it was stated no specific time could be given, but to hold all blood pressures medications. Reported given also at this time to RN, patient was informed with support provided.

## 2018-08-22 ENCOUNTER — Inpatient Hospital Stay (HOSPITAL_COMMUNITY): Payer: Medicare Other

## 2018-08-22 ENCOUNTER — Inpatient Hospital Stay (HOSPITAL_COMMUNITY): Payer: Medicare Other | Admitting: Speech Pathology

## 2018-08-22 LAB — GLUCOSE, CAPILLARY
Glucose-Capillary: 108 mg/dL — ABNORMAL HIGH (ref 70–99)
Glucose-Capillary: 95 mg/dL (ref 70–99)
Glucose-Capillary: 124 mg/dL — ABNORMAL HIGH (ref 70–99)
Glucose-Capillary: 91 mg/dL (ref 70–99)
Glucose-Capillary: 113 mg/dL — ABNORMAL HIGH (ref 70–99)

## 2018-08-22 MED ORDER — CHLORHEXIDINE GLUCONATE CLOTH 2 % EX PADS: 6.0000 | MEDICATED_PAD | Freq: Every day | CUTANEOUS | Status: DC

## 2018-08-22 MED ORDER — DARBEPOETIN ALFA 150 MCG/0.3ML IJ SOSY
150.0000 ug | PREFILLED_SYRINGE | INTRAMUSCULAR | Status: DC
  Filled 2018-08-22: qty 0.3

## 2018-08-22 NOTE — Progress Notes (Signed)
Pt refused to do hemodialysis.

## 2018-08-22 NOTE — Progress Notes (Signed)
[facility] Progress Note   Subjective:  Seen in room - no new complaints.Working with PT . Explained HD postponed until today.  Objective Vitals:   08/21/18 1454 08/21/18 2022 08/22/18 0546 08/22/18 0643  BP: (!) 150/71 (!) 150/69 (!) 154/57   Pulse: 78 78 78   Resp: 19 18 17    Temp: 98.6 F (37 C) 97.8 F (36.6 C) 98.7 F (37.1 C)   TempSrc: Oral Oral Oral   SpO2: 96% 98% (!) 85%   Weight:    97.5 kg  Height:       Physical Exam General:Chronically ill appearing man, NAD. Aware/alert. Sitting up in [facility] Heart:RRR; 2/6 murmur Lungs:CTAB Abdomen:soft Extremities:R AKA incision clean - generallized puffiness;  LLE 1+ pitting  edema; scattered wounds - large wound bandaged/not examined (see photos under "media") Dialysis Access:LUE AVF + thrill  Additional Objective Labs: Basic Metabolic Panel: Recent Labs  Lab 08/17/18 0416 08/18/18 0435 08/18/18 1133  NA 130* 129* 128*  K 4.0 4.1 4.4  CL 94* 93* 91*  CO2 24 22 25   GLUCOSE 168* 131* 156*  BUN 33* 52* 55*  CREATININE 6.36* 8.91* 9.19*  CALCIUM 8.8* 9.3 9.3  PHOS  --   --  2.9   Liver Function Tests: Recent Labs  Lab 08/15/18 1608 08/16/18 0416 08/18/18 0435 08/18/18 1133  AST 18 17 14*  --   ALT 8 8 8   --   ALKPHOS 118 112 101  --   BILITOT 0.4 0.6 0.6  --   PROT 7.5 7.7 7.9  --   ALBUMIN 2.3* 2.2* 2.3* 2.2*   CBC: Recent Labs  Lab 08/15/18 1525 08/16/18 0416 08/17/18 0416 08/18/18 0435 08/18/18 1133  WBC 9.9 10.2 10.7* 9.4 9.9  NEUTROABS 7.0  --   --  5.9  --   HGB 8.5* 8.7* 8.5* 8.9* 9.0*  HCT 27.2* 27.7* 26.9* 28.3* 28.5*  MCV 91.0 90.8 91.2 90.7 91.1  PLT 194 209 220 229 243   CBG: Recent Labs  Lab 08/21/18 0656 08/21/18 1141 08/21/18 1652 08/21/18 2151 08/22/18 0613  GLUCAP 147* 131* 144* 108* 95   Medications: . ceFEPime (MAXIPIME) IV 2 g (08/18/18 2301)  . metronidazole 500 mg (08/22/18 2878)  . vancomycin 1,000 mg (08/18/18 1844)   . brimonidine  1  drop Both Eyes BID  . calcium acetate  1,334 mg Oral TID WC  . Chlorhexidine Gluconate Cloth  6 each Topical Q0600  . darbepoetin (ARANESP) injection - DIALYSIS  150 mcg Intravenous Q Mon-HD  . docusate sodium  100 mg Oral BID  . fluticasone  1 spray Each Nare Daily  . heparin  5,000 Units Subcutaneous Q8H  . latanoprost  1 drop Both Eyes QHS  . metoCLOPramide  5 mg Oral BID AC  . metoprolol succinate  50 mg Oral QHS  . pantoprazole  40 mg Oral Daily  . sevelamer carbonate  800 mg Oral TID WC  . silver sulfADIAZINE   Topical Daily  . sodium chloride flush  3 mL Intravenous Q12H  . timolol  1 drop Both Eyes BID    Dialysis Orders: SW MWF 4h 66min 450/2x 95.5kg 2/2.25 bath P2 Hep 9000 L AVF -Parsabiv 10mg  TIW -Mircera 150 mcg IV q 2 weeks (last 5/4)  Assessment/Plan: 1. 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 Sharol Given. -  2. LLE gangrene (new): Concern that was infected; BCx drawn 5/26 negative. Started on Vanc/Cefepime. Dr. Sharol Given consulted and has recommended L AKA  in the future - no urgent need, not felt to be infected. 3. ESRD:  MWFHD bumped to today due to high dialysis patient census; plan HD this pm and again tomorrow back on schedule 4. Hypertension/volume: BP controlled, will need to lower EDW on discharge. Continue to serially lower volume & goal today to 3.5 - 4 L as tolerated 5. Anemia: Hgb9.0 5/29 .Continue Aranesp162mcg q Monday. tsat 22% 5/22/ferritin 1300s - likely inflammatory- had 2 doses ferrlicit - check CBC pre HD today 6. Metabolic bone disease: Corr Ca slightly high and Phos low - Reduced Ca acetate binder.to 2 ac 5/30 -if Ca still low today - will reduce further Continue Renvela also as binder. Parsabiv not available in hospital. Follow labs.  7. T2DM:Insulin per primary. Hypoglycemia overnight 5/25 - glucotrol stopped. 8. Twitchiness: Improved with holding gabapentin. 9. AMS (5/26 - 5/27): Hypoglycemia v. meds v. delirium v. L heel gangrene -  resolved; more engaging for the past several days   Iran Ouch, PA-C 08/22/2018, 8:39 AM  [facility] Pager: [phone]

## 2018-08-22 NOTE — Progress Notes (Signed)
Orthopedic Tech Progress Note Patient Details:  Howard Bell [birth year] [id number] Called in order to [facility] for AKA STUMP SHRINKER X2 Patient ID: Howard Bell, male   DOB: 11/30/1965, 53 y.o.   MRN: [id number]   Howard Bell 08/22/2018, 10:58 AM

## 2018-08-22 NOTE — Progress Notes (Signed)
[facility] PHYSICAL MEDICINE & REHABILITATION PROGRESS NOTE  Subjective/Complaints: Patient seen sitting up in his chair this morning.  He states he slept well overnight.  He states he wants to go home.  ROS: Denies CP, shortness of breath, nausea, vomiting, diarrhea.  Objective: Vital Signs: Blood pressure (!) 154/57, pulse 80, temperature 98.7 F (37.1 C), temperature source Oral, resp. rate 17, height 6' (1.829 m), weight 97.5 kg, SpO2 92 %. No results found. No results for input(s): WBC, HGB, HCT, PLT in the last 72 hours. No results for input(s): NA, K, CL, CO2, GLUCOSE, BUN, CREATININE, CALCIUM in the last 72 hours.  Physical Exam: BP (!) 154/57 (BP Location: Right Arm)   Pulse 80   Temp 98.7 F (37.1 C) (Oral)   Resp 17   Ht 6' (1.829 m)   Wt 97.5 kg   SpO2 92%   BMI 29.15 kg/m  Constitutional: No distress . Vital signs reviewed. HENT: Normocephalic.  Atraumatic. Eyes: EOMI.  No discharge. Cardiovascular: No JVD. Respiratory: Normal effort. GI: Non-distended. Musc: Right AKA with edema and tenderness Neurological: Alert Motor: Bilateral upper extremities: 5/5 proximal distal, except for left digit contractures Right lower extremity: Hip flexion 4+/5, unchanged Left lower extremity: 4/5 proximal to distal, unchanged Skin:Left lower extremity with necrotic changes.  Please see media tab.  RLE: with sutures C/D/I.  Psychiatric:Flat  Assessment/Plan: 1. 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  Physiatrist is providing close team supervision and 24 hour management of active medical problems listed below.  Physiatrist and rehab team continue to assess barriers to discharge/monitor patient progress toward functional and medical goals  Care Tool:  Bathing  Bathing activity did not occur: Refused           Bathing  assist       Upper Body Dressing/Undressing Upper body dressing   What is the patient wearing?: Hospital gown only    Upper body assist      Lower Body Dressing/Undressing Lower body dressing    Lower body dressing activity did not occur: Refused       Lower body assist       Toileting Toileting Toileting Activity did not occur (Clothing management and hygiene only): (pt did not need to toilet this session)  Toileting assist Assist for toileting: Maximal Assistance - Patient 25 - 49%     Transfers Chair/bed transfer  Transfers assist     Chair/bed transfer assist level: Minimal Assistance - Patient > 75% Chair/bed transfer assistive device: Programmer, multimedia   Ambulation assist   Ambulation activity did not occur: Safety/medical concerns          Walk 10 feet activity   Assist  Walk 10 feet activity did not occur: Safety/medical concerns        Walk 50 feet activity   Assist Walk 50 feet with 2 turns activity did not occur: Safety/medical concerns         Walk 150 feet activity   Assist Walk 150 feet activity did not occur: Safety/medical concerns         Walk 10 feet on uneven surface  activity   Assist Walk 10 feet on uneven surfaces activity did not occur: Safety/medical concerns         Wheelchair     Assist Will patient use wheelchair at discharge?: Yes Type of Wheelchair:  Manual    Wheelchair assist level: Supervision/Verbal cueing Max wheelchair distance: 150'    Wheelchair 50 feet with 2 turns activity    Assist        Assist Level: Supervision/Verbal cueing   Wheelchair 150 feet activity     Assist     Assist Level: Supervision/Verbal cueing      Medical Problem List and Plan: 1.Functional and mobility deficitssecondary to right BKA/PADwith revision to right AKA, complicated by SIRS             Continue CIR  Stump shrinker ordered 2.  Antithrombotics: -DVT/anticoagulation:Pharmaceutical:Heparin -antiplatelet therapy: N/A 3. Pain Management:             Continue gabapentin              Continue oxycodone prn.   Robaxin twice daily as needed started on 5/30  Relatively controlled on 6/2 4. Mood:LCSW to follow for evaluation and support. -antipsychotic agents: N/A 5. Neuropsych: This patientis not fullycapable of making decisions on hisown behalf.  DCed gabapentin and Ultram 6. Skin/Wound Care:             Discussed with Ortho and PA previously- Silvadene dressing.  Severe PVD with limited blood flow per vascular.  Discussed with nursing  Monitor right lower extremity  Monitor left lower extremity dry gangrene 7. Fluids/Electrolytes/Nutrition:Strict I/O.  8. T2DM: Hemoglobin A1c 10.1 on 5/18.  Continue to monitor BS ac/hs and use SSI for elevated BS.   Labile on 8/2             Monitor with increased mobility 9. ESRD: HD MWF. Schedule atend of the day to allow participation with therapies during the day and to assistwith activity tolerance.              Recs per nephro 10. Diabetic gastroparesis: On Reglan tid ac.  Will consider decreasing if necessary 11. Anemia of chronic disease: Continue Aranesp weekly for supplement.              Hemoglobin 9.0 on 5/29             Continue to monitor 12. Glaucoma: Managed with Timoptic, Alphagan and Xalatan. 13.  Labile blood pressure  Elevated on 6/2  Monitor with increased mobility 14.  Constipation immobility with opioids  Continue bowel meds 15.  SIRS  Continue IV Vanc, cefepime, Flagyl. Will discuss with PA.   Blood cultures no growth.  LOS: 5 days A FACE TO FACE EVALUATION WAS PERFORMED  Shalimar Mcclain Lorie Phenix 08/22/2018, 10:53 AM

## 2018-08-22 NOTE — Plan of Care (Signed)
  Problem: RH BOWEL ELIMINATION Goal: RH STG MANAGE BOWEL WITH ASSISTANCE Description STG Manage Bowel with Assistance. Outcome: Progressing Flowsheets (Taken 08/22/2018 1247) STG: Pt will manage bowels with assistance: 4-Minimum assistance Goal: RH STG MANAGE BOWEL W/MEDICATION W/ASSISTANCE Description STG Manage Bowel with Medication with Assistance. Outcome: Progressing Flowsheets (Taken 08/22/2018 1247) STG: Pt will manage bowels with medication with assistance: 4-Minimal assistance   Problem: RH SKIN INTEGRITY Goal: RH STG SKIN FREE OF INFECTION/BREAKDOWN Outcome: Progressing Goal: RH STG MAINTAIN SKIN INTEGRITY WITH ASSISTANCE Description STG Maintain Skin Integrity With Assistance. Outcome: Progressing Flowsheets (Taken 08/22/2018 1247) STG: Maintain skin integrity with assistance: 4-Minimal assistance Goal: RH STG ABLE TO PERFORM INCISION/WOUND CARE W/ASSISTANCE Description STG Able To Perform Incision/Wound Care With Assistance. Outcome: Progressing Flowsheets (Taken 08/22/2018 1247) STG: Pt will be able to perform incision/wound care with assistance: 4-Minimal assistance   Problem: RH SAFETY Goal: RH STG ADHERE TO SAFETY PRECAUTIONS W/ASSISTANCE/DEVICE Description STG Adhere to Safety Precautions With Assistance/Device. Outcome: Progressing Flowsheets (Taken 08/22/2018 1247) STG:Pt will adhere to safety precautions with assistance/device: 4-Minimal assistance Goal: RH STG DECREASED RISK OF FALL WITH ASSISTANCE Description STG Decreased Risk of Fall With Assistance. Outcome: Progressing Flowsheets (Taken 08/22/2018 1247) VZC:[id number] risk of fall  with assistance/device: 4-Minimal assistance   Problem: RH PAIN MANAGEMENT Goal: RH STG PAIN MANAGED AT OR BELOW PT'S PAIN GOAL Outcome: Progressing   Problem: RH KNOWLEDGE DEFICIT LIMB LOSS Goal: RH STG INCREASE KNOWLEDGE OF SELF CARE AFTER LIMB LOSS Outcome: Progressing   Problem: Education: Goal: Knowledge of General  Education information will improve Description Including pain rating scale, medication(s)/side effects and non-pharmacologic comfort measures Outcome: Progressing   Problem: Health Behavior/Discharge Planning: Goal: Ability to manage health-related needs will improve Outcome: Progressing   Problem: Clinical Measurements: Goal: Ability to maintain clinical measurements within normal limits will improve Outcome: Progressing Goal: Will remain free from infection Outcome: Progressing

## 2018-08-22 NOTE — Progress Notes (Signed)
Physical Therapy Session Note  Patient Details  Name: Howard Bell MRN: [id number] Date of Birth: 05-30-1965  Today's Date: 08/22/2018 PT Individual Time: 4098-1191 PT Individual Time Calculation (min): 70 min   Short Term Goals: Week 1:  PT Short Term Goal 1 (Week 1): Patient to demonstrate dynamic sitting balance with S.  PT Short Term Goal 2 (Week 1): Patient to perform side board transfer with S including set up. PT Short Term Goal 3 (Week 1): Patient to propel w/c mod I on level indoor surfaces. PT Short Term Goal 4 (Week 1): Patient to perform sit to stand with CGA in prep for car transfer to SUV  Skilled Therapeutic Interventions/Progress Updates:    Patient seated in w/c reports did not have HD yesterday but supposed to go today.  Patient seated for assist to wrap R LE.  Propelled w/c x 80' c/o L hand pain at 1st MCP joint.  Patient Sit to stand and pivot with RW min A with assist for walker safety to Nu Step.  Seated for Nu Step Bil UE and L LE then L UE and L LE due to c/o R shoulder pain x 8 min @ L3.  Patient stand pivot to w/c min A with cues and A for walker safety.    Patient set up w/c for SBT with min A for leg rest and mod cues.  Able to lean, but was unable to place board so A to place board and SBT with S.  Patient sit to supine with S.  Performed R leg hip extension isometric in supine max cues for breathing and noted SpO2 drop to 70's, back up to mid 80's with consistent cues for breathing.  Sidelying hip abduction and hip extension 2 x 10 reps, side to sit min A and pt sitting with SpO2 82% increased to 92% with cues for deep breathing.  SBT to w/c min A and cues for correct board placement, technique, but close S for actual transfer.  In w/c propelled about 30' and c/o L hand pain so assist to room in w/c and left with call bell in reach, belt alarm activated and RN in room.   Therapy Documentation Precautions:  Precautions Precautions: Fall Restrictions Weight  Bearing Restrictions: Yes RLE Weight Bearing: Non weight bearing Vital Signs: Therapy Vitals Temp: 98.7 F (37.1 C) Temp Source: Oral Pulse Rate: 80 Oxygen Therapy SpO2: 92 % O2 Device: Room Air Patient Activity (if Appropriate): Other (Comment)(sitting on Nu Step) Pulse Oximetry Type: Intermittent Pain: Pain Assessment Pain Score: 2  Pain Type: Acute pain Pain Location: Shoulder Pain Orientation: Right;Anterior Pain Descriptors / Indicators: Aching;Discomfort Pain Onset: With Activity Pain Intervention(s): Repositioned;Massage;Rest    Therapy/Group: Individual Therapy  Reginia Naas  Magda Kiel, PT 08/22/2018, 9:06 AM

## 2018-08-22 NOTE — Progress Notes (Addendum)
Transporter here to get patient  For Dialysis refused. Dialysis RN informed by transporter

## 2018-08-22 NOTE — Progress Notes (Signed)
Occupational Therapy Session Note  Patient Details  Name: Howard Bell MRN: [id number] Date of Birth: 09-05-1965  Today's Date: 08/22/2018 OT Individual Time: 5681-2751 OT Individual Time Calculation (min): 57 min    Short Term Goals: Week 2:  OT Short Term Goal 1 (Week 2): Patient will complete toileting with Min assist  OT Short Term Goal 2 (Week 2): Patient will complete toilet transfers with Min assist and appropriate DME as needed  Skilled Therapeutic Interventions/Progress Updates:    Session focused on ADL transfers and BUE strengthening. Pt initially received sitting in w/c with biotech present with R residual limb sleeve. Pt transferred to bed, squat pivot transfer with min A. Sleeve was donned and pt with no c/o pain. Pt transitioned back to EOB with CGA, use of bed rails. Pt completed squat pivot transfer toward R side and nearly missed w/c. Mod A required to correct and pt demonstrating good safety awareness in correction. Pt then propelled w/c 100 ft with (S) and more than reasonable time. In therapy gym pt completed tub transfer with TTB, min A, edu provided re purpose and safety awareness. Pt returned to w/c and completed 2x sit > stand with min A to power up, min-mod A standing balance support. Moderate cueing provided for upright posture, R residual limb positioning, and body mechanics. Pt then completed BUE strengthening circuit with 5 # dumbbell and cueing/demo provided for form. Pt returned to room and left sitting up with fresh cup of coffee, chair alarm set.   Therapy Documentation Precautions:  Precautions Precautions: Fall Restrictions Weight Bearing Restrictions: Yes RLE Weight Bearing: Non weight bearing   Therapy/Group: Individual Therapy  Curtis Sites 08/22/2018, 2:28 PM

## 2018-08-22 NOTE — Progress Notes (Signed)
Speech Language Pathology Daily Session Note  Patient Details  Name: Howard Bell MRN: [id number] Date of Birth: 28-Feb-1966  Today's Date: 08/22/2018 SLP Individual Time: 1030-1130 SLP Individual Time Calculation (min): 60 min  Short Term Goals: Week 1: SLP Short Term Goal 1 (Week 1): STGs=LTGs  Skilled Therapeutic Interventions: Skilled treatment session focused on cognitive goals. SLP facilitated session by providing Mod A verbal cues for recall and problem solving during a mildly complex, novel task. SLP also facilitated session by providing total A for patient to record blood sugars on calendar in his room (did at home). Patient performed a complex medication management task with overall Mod A verbal cues for problem solving. Patient left upright in wheelchair with alarm on and all needs within reach. Continue with current plan of care.      Pain Pain Assessment Pain Score: 2  Pain Type: Acute pain Pain Location: Shoulder Pain Orientation: Right;Anterior Pain Descriptors / Indicators: Aching;Discomfort Pain Onset: With Activity Pain Intervention(s): Repositioned;Massage;Rest  Therapy/Group: Individual Therapy  Ekaterina Denise 08/22/2018, 12:50 PM

## 2018-08-23 ENCOUNTER — Inpatient Hospital Stay (HOSPITAL_COMMUNITY): Payer: Medicare Other

## 2018-08-23 ENCOUNTER — Inpatient Hospital Stay (HOSPITAL_COMMUNITY): Payer: Medicare Other | Admitting: Physical Therapy

## 2018-08-23 ENCOUNTER — Inpatient Hospital Stay (HOSPITAL_COMMUNITY): Payer: Medicare Other | Admitting: Speech Pathology

## 2018-08-23 DIAGNOSIS — R0902 Hypoxemia: Secondary | ICD-10-CM

## 2018-08-23 LAB — RENAL FUNCTION PANEL
Albumin: 2.2 g/dL — ABNORMAL LOW (ref 3.5–5.0)
Anion gap: 15 (ref 5–15)
BUN: 98 mg/dL — ABNORMAL HIGH (ref 6–20)
CO2: 21 mmol/L — ABNORMAL LOW (ref 22–32)
Calcium: 9.4 mg/dL (ref 8.9–10.3)
Chloride: 91 mmol/L — ABNORMAL LOW (ref 98–111)
Creatinine, Ser: 14.58 mg/dL — ABNORMAL HIGH (ref 0.61–1.24)
GFR calc Af Amer: 4 mL/min — ABNORMAL LOW (ref >60)
GFR calc non Af Amer: 3 mL/min — ABNORMAL LOW (ref >60)
Glucose, Bld: 81 mg/dL (ref 70–99)
Phosphorus: 3.6 mg/dL (ref 2.5–4.6)
Potassium: 5.5 mmol/L — ABNORMAL HIGH (ref 3.5–5.1)
Sodium: 127 mmol/L — ABNORMAL LOW (ref 135–145)

## 2018-08-23 LAB — GLUCOSE, CAPILLARY
Glucose-Capillary: 75 mg/dL (ref 70–99)
Glucose-Capillary: 54 mg/dL — ABNORMAL LOW (ref 70–99)
Glucose-Capillary: 72 mg/dL (ref 70–99)
Glucose-Capillary: 113 mg/dL — ABNORMAL HIGH (ref 70–99)

## 2018-08-23 LAB — CBC
HCT: 26 % — ABNORMAL LOW (ref 39.0–52.0)
Hemoglobin: 8.3 g/dL — ABNORMAL LOW (ref 13.0–17.0)
MCH: 28.6 pg (ref 26.0–34.0)
MCHC: 31.9 g/dL (ref 30.0–36.0)
MCV: 89.7 fL (ref 80.0–100.0)
Platelets: 267 10*3/uL (ref 150–400)
RBC: 2.9 MIL/uL — ABNORMAL LOW (ref 4.22–5.81)
RDW: 16.5 % — ABNORMAL HIGH (ref 11.5–15.5)
WBC: 8.4 10*3/uL (ref 4.0–10.5)
nRBC: 0 % (ref 0.0–0.2)

## 2018-08-23 MED ORDER — HEPARIN SODIUM (PORCINE) 1000 UNIT/ML DIALYSIS
20.0000 [IU]/kg | INTRAMUSCULAR | Status: DC | PRN
  Administered 2018-08-23: 16:00:00 2000 [IU] via INTRAVENOUS_CENTRAL
  Filled 2018-08-23: qty 2

## 2018-08-23 MED ORDER — SODIUM CHLORIDE 0.9 % IV SOLN: 100.0000 mL | INTRAVENOUS | Status: DC | PRN

## 2018-08-23 MED ORDER — HEPARIN SODIUM (PORCINE) 1000 UNIT/ML DIALYSIS: 1000.0000 [IU] | INTRAMUSCULAR | Status: DC | PRN

## 2018-08-23 MED ORDER — DARBEPOETIN ALFA 150 MCG/0.3ML IJ SOSY
PREFILLED_SYRINGE | INTRAMUSCULAR | Status: AC
  Administered 2018-08-23: 150 ug via INTRAVENOUS
  Filled 2018-08-23: qty 0.3

## 2018-08-23 MED ORDER — DARBEPOETIN ALFA 150 MCG/0.3ML IJ SOSY
150.0000 ug | PREFILLED_SYRINGE | INTRAMUSCULAR | Status: DC
  Administered 2018-08-23 – 2018-08-30 (×2): 150 ug via INTRAVENOUS
  Filled 2018-08-23: qty 0.3

## 2018-08-23 MED ORDER — LIDOCAINE HCL (PF) 1 % IJ SOLN: 5.0000 mL | INTRAMUSCULAR | Status: DC | PRN

## 2018-08-23 MED ORDER — PENTAFLUOROPROP-TETRAFLUOROETH EX AERO: 1.0000 "application " | INHALATION_SPRAY | CUTANEOUS | Status: DC | PRN

## 2018-08-23 MED ORDER — HEPARIN SODIUM (PORCINE) 1000 UNIT/ML IJ SOLN
INTRAMUSCULAR | Status: AC
  Administered 2018-08-23: 2000 [IU] via INTRAVENOUS_CENTRAL
  Filled 2018-08-23: qty 2

## 2018-08-23 MED ORDER — LIDOCAINE-PRILOCAINE 2.5-2.5 % EX CREA: 1.0000 "application " | TOPICAL_CREAM | CUTANEOUS | Status: DC | PRN

## 2018-08-23 MED ORDER — ALTEPLASE 2 MG IJ SOLR: 2.0000 mg | Freq: Once | INTRAMUSCULAR | Status: DC | PRN

## 2018-08-23 NOTE — Progress Notes (Signed)
Speech Language Pathology Daily Session Note  Patient Details  Name: Fallon Howerter MRN: [id number] Date of Birth: May 08, 1965  Today's Date: 08/23/2018 SLP Missed Time: 34 Minutes Missed Time Reason: X-ray;Other (Comment)(Then HD)  Pt missed 60 minutes of skilled ST d/t off unit for chest x-ray and then HD.   Lindy Garczynski 08/23/2018, 12:39 PM

## 2018-08-23 NOTE — Progress Notes (Signed)
[place] PHYSICAL MEDICINE & REHABILITATION PROGRESS NOTE  Subjective/Complaints: Patient seen laying in bed this AM.  He states he slept well overnight.    ROS: Denies CP, shortness of breath, nausea, vomiting, diarrhea.  Objective: Vital Signs: Blood pressure (!) 141/84, pulse 81, temperature 98.3 F (36.8 C), temperature source Oral, resp. rate 18, height 6' (1.829 m), weight 98.5 kg, SpO2 98 %. Dg Chest 2 View  Result Date: 08/23/2018 CLINICAL DATA:  Hypoxia EXAM: CHEST - 2 VIEW COMPARISON:  08/15/2018 FINDINGS: Cardiac shadow is mildly prominent. Lungs are well aerated bilaterally with mild bibasilar chronic scarring stable from the previous exam. Vascular stents on the left are again seen and stable. No new bony abnormality is noted. IMPRESSION: Bibasilar scarring.  No acute abnormality noted. Electronically Signed   By: Inez Catalina M.D.   On: 08/23/2018 12:18   Recent Labs    08/23/18 1243  WBC 8.4  HGB 8.3*  HCT 26.0*  PLT 267   Recent Labs    08/23/18 1243  NA 127*  K 5.5*  CL 91*  CO2 21*  GLUCOSE 81  BUN 98*  CREATININE 14.58*  CALCIUM 9.4    Physical Exam: BP (!) 141/84   Pulse 81   Temp 98.3 F (36.8 C) (Oral)   Resp 18   Ht 6' (1.829 m)   Wt 98.5 kg   SpO2 98%   BMI 29.45 kg/m  Constitutional: No distress . Vital signs reviewed. HENT: Normocephalic, atraumatic.  Eyes: EOMI. No discharge. Cardiovascular: No JVD. Respiratory: Normal effort. GI: Non-distended. Musc: Right AKA with edema and tenderness Neurological: Alert Motor: Bilateral upper extremities: 5/5 proximal distal, except for left digit contractures Right lower extremity: Hip flexion 4+/5, stable Left lower extremity: 4/5 proximal to distal, stable Skin:Left lower extremity with necrotic changes, stable.  Please see media tab.  RLE: with dressing C/D/I.  Psychiatric:Flat  Assessment/Plan: 1. 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  Physiatrist is providing close team supervision and 24 hour management of active medical problems listed below.  Physiatrist and rehab team continue to assess barriers to discharge/monitor patient progress toward functional and medical goals  Care Tool:  Bathing  Bathing activity did not occur: Refused Body parts bathed by patient: Right arm, Left arm, Chest, Abdomen, Face, Front perineal area, Right upper leg, Left upper leg, Left lower leg   Body parts bathed by helper: Buttocks Body parts n/a: Right lower leg   Bathing assist Assist Level: Moderate Assistance - Patient 50 - 74%     Upper Body Dressing/Undressing Upper body dressing   What is the patient wearing?: Pull over shirt    Upper body assist Assist Level: Supervision/Verbal cueing    Lower Body Dressing/Undressing Lower body dressing    Lower body dressing activity did not occur: Refused What is the patient wearing?: Pants, Underwear/pull up     Lower body assist Assist for lower body dressing: Maximal Assistance - Patient 25 - 49%     Toileting Toileting Toileting Activity did not occur (Clothing management and hygiene only): (pt did not need to toilet this session)  Toileting assist Assist for toileting: Maximal Assistance - Patient 25 - 49%     Transfers Chair/bed transfer  Transfers assist     Chair/bed transfer assist level: Minimal Assistance - Patient > 75% Chair/bed transfer assistive device: Walker, Sliding board  Locomotion Ambulation   Ambulation assist   Ambulation activity did not occur: Safety/medical concerns          Walk 10 feet activity   Assist  Walk 10 feet activity did not occur: Safety/medical concerns        Walk 50 feet activity   Assist Walk 50 feet with 2 turns activity did not occur: Safety/medical concerns         Walk 150 feet  activity   Assist Walk 150 feet activity did not occur: Safety/medical concerns         Walk 10 feet on uneven surface  activity   Assist Walk 10 feet on uneven surfaces activity did not occur: Safety/medical concerns         Wheelchair     Assist Will patient use wheelchair at discharge?: Yes Type of Wheelchair: Manual    Wheelchair assist level: Supervision/Verbal cueing Max wheelchair distance: 41'    Wheelchair 50 feet with 2 turns activity    Assist        Assist Level: Supervision/Verbal cueing   Wheelchair 150 feet activity     Assist     Assist Level: Supervision/Verbal cueing      Medical Problem List and Plan: 1.Functional and mobility deficitssecondary to right BKA/PADwith revision to right AKA, complicated by SIRS             Continue CIR  Stump shrinker ordered, not yet received  Team conference today to discuss current and goals and coordination of care, home and environmental barriers, and discharge planning with nursing, case manager, and therapies.  2. Antithrombotics: -DVT/anticoagulation:Pharmaceutical:Heparin -antiplatelet therapy: N/A 3. Pain Management:             Continue gabapentin              Continue oxycodone prn.   Robaxin twice daily as needed started on 5/30  Relatively controlled on 6/3 4. Mood:LCSW to follow for evaluation and support. -antipsychotic agents: N/A 5. Neuropsych: This patientis not fullycapable of making decisions on hisown behalf.  DCed gabapentin and Ultram 6. Skin/Wound Care:             Discussed with Ortho and PA previously- Silvadene dressing.  Severe PVD with limited blood flow per vascular.  Discussed with nursing  Monitor right lower extremity  Monitor left lower extremity dry gangrene 7. Fluids/Electrolytes/Nutrition:Strict I/O.  8. T2DM: Hemoglobin A1c 10.1 on 5/18.  Continue to monitor BS ac/hs and use SSI for elevated BS.   Labile on  6/3             Monitor with increased mobility 9. ESRD: HD MWF. Schedule atend of the day to allow participation with therapies during the day and to assistwith activity tolerance.              Recs per nephro 10. Diabetic gastroparesis: On Reglan tid ac.  Will consider decreasing if necessary 11. Anemia of chronic disease: Continue Aranesp weekly for supplement.              Hemoglobin 9.0 on 5/29             Continue to monitor 12. Glaucoma: Managed with Timoptic, Alphagan and Xalatan. 13.  Labile blood pressure  Significantly elevated on 6/3  Monitor with increased mobility 14.  Constipation immobility with opioids  Continue bowel meds 15.  SIRS: Resolved  D/ced IV Vanc, cefepime, Flagyl.   Blood cultures no growth. 16.  Supplemental oxygen dependent  Wean  as tolerated  LOS: 6 days A FACE TO FACE EVALUATION WAS PERFORMED  Badr Piedra Lorie Phenix 08/23/2018, 2:52 PM

## 2018-08-23 NOTE — Significant Event (Signed)
Hypoglycemic Event  CBG: 54  Treatment: 4 oz juice/soda  Symptoms: None  Follow-up CBG: Time:1913 CBG Result:72  Possible Reasons for Event: Unknown  Comments/MD notified:Patient given 4oz Ginger Ale and dinner tray arrived, also given graham crackers and ice cream.     Crue Otero G

## 2018-08-23 NOTE — Progress Notes (Signed)
Physical Therapy Session Note  Patient Details  Name: Howard Bell MRN: [id number] Date of Birth: 1966-03-07  Today's Date: 08/23/2018 PT Individual Time: 5852-7782 PT Individual Time Calculation (min): 57 min   Short Term Goals: Week 1:  PT Short Term Goal 1 (Week 1): Patient to demonstrate dynamic sitting balance with S.  PT Short Term Goal 2 (Week 1): Patient to perform side board transfer with S including set up. PT Short Term Goal 3 (Week 1): Patient to propel w/c mod I on level indoor surfaces. PT Short Term Goal 4 (Week 1): Patient to perform sit to stand with CGA in prep for car transfer to SUV  Skilled Therapeutic Interventions/Progress Updates:  Pt in bed on arrival to room with nursing changing dressing on left foot/ankle. Agreeable to PT after jokinly saying "I'm not doing any therapy today"/  Assisted RN with completion of dressing application by holding pt's leg up. Supervision for supine to sitting edge of bed. Total assist to place slide board with cues to pt on how to position it and to check positioning by looking between legs. Mod assist for transfer with board from lower bed to higher wheelchair surface with cues on weight shifting and use of UE's. Pt propelled wheelchair around bed to hallway and down to NuStep. Scoot pivot without board wheelchair to higher surface of NuStep with min/mod assist. Cues on technique. NuStep with UE's/left LE at level 5.0 for 8 Minutes with >/=30 steps per minute for strengthening and acitvity tolearnce. Multiple rest breaks needed due to fatigue. Mod assist to transfer from NuStep to wheelchair via scoot/squat pivot without slide board with cues on weight shifting. Once back in wheelchair had pt perform 180 degree turn and then maneuver wheelchair through series of cones along ~25 foot pathway, weaving though them with minimal space between them at a supervision level. Occasional cues for obstacle avoidance on sides of pathway. Pt then  propelled himself back to his room, ~50 more feet at supervision level.   Pt left in room in wheelchair with belt alarm on and all needs in reach.   Therapy Documentation Precautions:  Precautions Precautions: Fall Restrictions Weight Bearing Restrictions: Yes RLE Weight Bearing: Non weight bearing    Therapy/Group: Individual Therapy  Willow Ora, PTA 08/23/2018, 6:44 AM

## 2018-08-23 NOTE — Progress Notes (Signed)
[facility] Progress Note   Subjective:  Seen in room - no new complaints .Missed Monday HD due to staffing and declined Tuesday HD - said they came at 1 am today. Plan HD today after PT/OT.    Objective Vitals:   08/22/18 1408 08/22/18 1943 08/22/18 2221 08/23/18 0533  BP: (!) 159/84 (!) 150/73 (!) 143/55 (!) 143/50  Pulse: 89 86 75 72  Resp: 20 18  18   Temp:  98.4 F (36.9 C)  98.7 F (37.1 C)  TempSrc:  Oral  Oral  SpO2: 92% (!) 76%  (!) 78%  Weight:    98.9 kg  Height:       Physical Exam General:Chronically ill appearing man, NAD. Aware/alert. Supine in bed breathing easily Heart:RRR; 2/6 murmur Lungs:CTAB Abdomen:soft Extremities:R AKA sock on  LLE 1+ pitting  edema; scattered wounds - large wound bandaged/not examined (see photos under "media")- less edema today b/c he has been in bed all night Dialysis Access:LUE AVF + thrill  Additional Objective Labs: Basic Metabolic Panel: Recent Labs  Lab 08/17/18 0416 08/18/18 0435 08/18/18 1133  NA 130* 129* 128*  K 4.0 4.1 4.4  CL 94* 93* 91*  CO2 24 22 25   GLUCOSE 168* 131* 156*  BUN 33* 52* 55*  CREATININE 6.36* 8.91* 9.19*  CALCIUM 8.8* 9.3 9.3  PHOS  --   --  2.9   Liver Function Tests: Recent Labs  Lab 08/18/18 0435 08/18/18 1133  AST 14*  --   ALT 8  --   ALKPHOS 101  --   BILITOT 0.6  --   PROT 7.9  --   ALBUMIN 2.3* 2.2*   CBC: Recent Labs  Lab 08/17/18 0416 08/18/18 0435 08/18/18 1133  WBC 10.7* 9.4 9.9  NEUTROABS  --  5.9  --   HGB 8.5* 8.9* 9.0*  HCT 26.9* 28.3* 28.5*  MCV 91.2 90.7 91.1  PLT 220 229 243   CBG: Recent Labs  Lab 08/22/18 0613 08/22/18 1236 08/22/18 1751 08/22/18 2128 08/23/18 0645  GLUCAP 95 124* 91 113* 75   Medications:  . brimonidine  1 drop Both Eyes BID  . calcium acetate  1,334 mg Oral TID WC  . Chlorhexidine Gluconate Cloth  6 each Topical Q0600  . darbepoetin (ARANESP) injection - DIALYSIS  150 mcg Intravenous Q Mon-HD  .  darbepoetin (ARANESP) injection - DIALYSIS  150 mcg Intravenous Q Tue-HD  . docusate sodium  100 mg Oral BID  . fluticasone  1 spray Each Nare Daily  . heparin  5,000 Units Subcutaneous Q8H  . latanoprost  1 drop Both Eyes QHS  . metoCLOPramide  5 mg Oral BID AC  . metoprolol succinate  50 mg Oral QHS  . pantoprazole  40 mg Oral Daily  . sevelamer carbonate  800 mg Oral TID WC  . silver sulfADIAZINE   Topical Daily  . sodium chloride flush  3 mL Intravenous Q12H  . timolol  1 drop Both Eyes BID    Dialysis Orders: SW MWF 4h 14min 450/2x 95.5kg 2/2.25 bath P2 Hep 9000 L AVF -Parsabiv 10mg  TIW -Mircera 150 mcg IV q 2 weeks (last 5/4)  Assessment/Plan: 1. 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 Sharol Given. -  2. LLE gangrene (new): Concern that was infected; BCx drawn 5/26 negative. Started on Vanc/Cefepime. Dr. Sharol Given consulted and has recommended L AKA in the future - no urgent need, not felt to be infected. 3. ESRD:  MWFHD bumped to today  due to high dialysis patient census; plan HD this pm and again tomorrow back on schedule 4. Hypertension/volume: BP controlled, will need to lower EDW on discharge. Continue to serially lower volume goal  3.5 - 4 L as tolerated 5. Anemia: Hgb9.0 5/29 .Continue Aranesp165mcg q Monday. tsat 22% 5/22/ferritin 1300s - likely inflammatory- had 2 doses ferrlicit - check CBC pre HD today 6. Metabolic bone disease: Corr Ca slightly high and Phos low - Reduced Ca acetate binder.to 2 ac 5/30 -if Ca still low today - will reduce further Continue Renvela also as binder. Parsabiv not available in hospital. Follow labs.  7. T2DM:Insulin per primary. Hypoglycemia overnight 5/25 - glucotrol stopped. 8. Twitchiness: Improved with holding gabapentin. 9. AMS (5/26 - 5/27): Hypoglycemia v. meds v. delirium v. L heel gangrene - resolved; more engaging for the past several days   Iran Ouch, PA-C 08/23/2018, 8:13 AM  [facility] Pager:  [phone]

## 2018-08-23 NOTE — Procedures (Signed)
Patient seen on Hemodialysis. BP (!) 143/50 (BP Location: Right Wrist)   Pulse 72   Temp 98.7 F (37.1 C) (Oral)   Resp 18   Ht 6' (1.829 m)   Wt 98.9 kg   SpO2 (!) 78%   BMI 29.57 kg/m   QB 450, UF goal 4L Tolerating treatment without complaints at this time.   Elmarie Shiley MD [facility]. Office # [phone] Pager # [phone] 12:18 PM

## 2018-08-23 NOTE — Progress Notes (Addendum)
Patient has had hypoxia with activity and reports that he was oxygen dependent at home. Will order CXR to rule out acute process. Does have history of OSA. Records reviewed and note that he has has required oxygen 2-3 L  prn during hospitalization.  Chart notes from primary care indicates that patient was on 2 L at home. Will resume.

## 2018-08-23 NOTE — Progress Notes (Signed)
Occupational Therapy Session Note  Patient Details  Name: Howard Bell MRN: [id number] Date of Birth: Mar 29, 1965  Today's Date: 08/23/2018 OT Individual Time: 2336-1224 OT Individual Time Calculation (min): 70 min    Short Term Goals: Week 2:  OT Short Term Goal 1 (Week 2): Patient will complete toileting with Min assist  OT Short Term Goal 2 (Week 2): Patient will complete toilet transfers with Min assist and appropriate DME as needed  Skilled Therapeutic Interventions/Progress Updates:    Pt received in w/c with no c/o pain. Session focused on ADLs at sink. Pt washed UB with set up assist and donned shirt. Mod A to wash LB, especially posteriorly peri in standing. Pt requiring max A to stand at sink this session from low w/c, cueing for technique provided. Pt required max A to don pants and underwear sit <> stand d/t pt being unable to laterally lean sufficiently. Discussed use of AE/AD with pt to increase independence with ADLs at home. Pt asymptomatic of any hypoxia throughout session, however upon assessing, Spo2 at 85% following sit > stand. Pt given extended rest break and SpO2 now at 77%. RN alerted and 2 L [place] administered. Pt given breathing cues and SpO2 rose to 90% following several minutes. Pt asymptomatic to any desaturation. Pt was left sitting up in his w/c with all needs met, chair alarm set.   Therapy Documentation Precautions:  Precautions Precautions: Fall Restrictions Weight Bearing Restrictions: Yes RLE Weight Bearing: Non weight bearing   Therapy/Group: Individual Therapy  Curtis Sites 08/23/2018, 7:15 AM

## 2018-08-24 ENCOUNTER — Inpatient Hospital Stay (HOSPITAL_COMMUNITY): Payer: Medicare Other

## 2018-08-24 ENCOUNTER — Inpatient Hospital Stay (HOSPITAL_COMMUNITY): Payer: Medicare Other | Admitting: Physical Therapy

## 2018-08-24 ENCOUNTER — Inpatient Hospital Stay (HOSPITAL_COMMUNITY): Payer: Medicare Other | Admitting: Speech Pathology

## 2018-08-24 LAB — GLUCOSE, CAPILLARY
Glucose-Capillary: 132 mg/dL — ABNORMAL HIGH (ref 70–99)
Glucose-Capillary: 210 mg/dL — ABNORMAL HIGH (ref 70–99)
Glucose-Capillary: 154 mg/dL — ABNORMAL HIGH (ref 70–99)
Glucose-Capillary: 152 mg/dL — ABNORMAL HIGH (ref 70–99)

## 2018-08-24 MED ORDER — ACETAMINOPHEN 325 MG PO TABS: 325.0000 mg | ORAL_TABLET | ORAL | Status: DC | PRN

## 2018-08-24 MED ORDER — INSULIN ASPART 100 UNIT/ML ~~LOC~~ SOLN: 0.0000 [IU] | Freq: Three times a day (TID) | SUBCUTANEOUS | Status: DC

## 2018-08-24 MED ORDER — INSULIN ASPART 100 UNIT/ML ~~LOC~~ SOLN
3.0000 [IU] | Freq: Once | SUBCUTANEOUS | Status: AC
  Administered 2018-08-24: 3 [IU] via SUBCUTANEOUS

## 2018-08-24 MED ORDER — SORBITOL 70 % SOLN: 30.0000 mL | Freq: Every day | Status: DC | PRN

## 2018-08-24 MED ORDER — CHLORHEXIDINE GLUCONATE CLOTH 2 % EX PADS: 6.0000 | MEDICATED_PAD | Freq: Every day | CUTANEOUS | Status: DC

## 2018-08-24 MED ORDER — INSULIN ASPART 100 UNIT/ML ~~LOC~~ SOLN
0.0000 [IU] | Freq: Three times a day (TID) | SUBCUTANEOUS | Status: DC
  Administered 2018-08-24 – 2018-08-29 (×8): 2 [IU] via SUBCUTANEOUS

## 2018-08-24 NOTE — Progress Notes (Signed)
Speech Language Pathology Daily Session Note  Patient Details  Name: Howard Bell MRN: [id number] Date of Birth: 1966-01-24  Today's Date: 08/24/2018 SLP Individual Time: 1100-1155 SLP Individual Time Calculation (min): 55 min  Short Term Goals: Week 1: SLP Short Term Goal 1 (Week 1): STGs=LTGs  Skilled Therapeutic Interventions: Skilled treatment session focused on cognitive goals. SLP facilitated session by providing Max A verbal cues for use of compensatory strategies to recall procedures to a previously learned task. Max A verbal cues were also needed for complex problem solving throughout task. Function was exacerbated by verbosity and poor topic maintenance. Patient left upright in wheelchair with all needs within reach and alarm on. Continue with current plan of care.       Pain No/Denies Pain   Therapy/Group: Individual Therapy  Terrilee Dudzik 08/24/2018, 3:12 PM

## 2018-08-24 NOTE — Patient Care Conference (Signed)
Inpatient RehabilitationTeam Conference and Plan of Care Update Date: 08/23/2018   Time: 1:35 PM    Patient Name: Howard Bell      Medical Record Number: [id number]  Date of Birth: 1965-12-25 Sex: Male         Room/Bed: HD06C/HD06C Payor Info: Payor: MEDICARE / Plan: MEDICARE PART A AND B / Product Type: *No Product type* /    Admitting Diagnosis: NT Team Inter stay 5-26 - 5-28 Uni. Rt. AKA, PVD; 17-18days from 5-18 admit date  Admit Date/Time:  08/17/2018  5:25 PM Admission Comments: No comment available   Primary Diagnosis:  <principal problem not specified> Principal Problem: <principal problem not specified>  Patient Active Problem List   Diagnosis Date Noted  . Oxygen desaturation   . ESRD (end stage renal disease) ([place])   . Labile blood glucose   . Postoperative pain   . Dry gangrene ([place])   . SIRS (systemic inflammatory response syndrome) (H[facility])   . Poorly controlled type 2 diabetes mellitus with peripheral neuropathy ([place])   . Peripheral arterial disease ([place])   . Unilateral AKA, right ([place]) 08/17/2018  . Sepsis ([place]) 08/15/2018  . Acute metabolic encephalopathy 08/65/7846  . Gangrene of left foot ([place])   . Hypoglycemia   . Drug induced constipation   . Confusion, postoperative   . Labile blood pressure   . Uncontrolled diabetes mellitus type 2 with peripheral artery disease ([place])   . Anemia of chronic disease   . Diabetes mellitus type 2 in nonobese (HCC)   . Supplemental oxygen dependent   . Unilateral AKA, left ([place]) 08/07/2018  . S/P BKA (below knee amputation), right ([place]) 08/04/2018  . Severe protein-calorie malnutrition ([place]) 08/01/2018  . Dehiscence of amputation stump ([place]) 08/01/2018  . Cutaneous abscess of left foot 08/01/2018  . Cellulitis of right lower extremity   . Influenza A 05/12/2018  . Pneumonia 05/12/2018  . Gangrene of right foot ([place])   . Critical limb ischemia with history of revascularization of same extremity 05/09/2018  . LUQ pain    . 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 ([place]) 11/16/2017  . CKD stage 5 due to type 2 diabetes mellitus ([place]) 11/16/2017  . Glaucoma due to type 2 diabetes mellitus ([place]) 11/16/2017  . Chronic generalized pain 11/16/2017  . Recurrent pneumonia 11/06/2017  . Diabetic gastroparesis ([place]) 11/06/2017  . Generalized abdominal pain 09/13/2017  . Acute pain of right shoulder 07/26/2017  . Acute bursitis of right shoulder 07/26/2017  . Left arm swelling 03/28/2017  . ESRD on dialysis ([place]) 03/28/2017  . Chest pain 11/26/2016  . Essential hypertension 11/26/2016  . Anemia due to end stage renal disease ([place]) 08/04/2016  . Acute respiratory failure with hypoxemia ([place]) 08/04/2016  . Type 2 diabetes mellitus with foot ulcer ([place]) 08/04/2016  . Acute CHF (congestive heart failure) ([place]) 08/04/2016  . Elevated troponin     Expected Discharge Date: Expected Discharge Date: 08/30/18  Team Members Present: Physician leading conference: Dr. Delice Lesch Social Worker Present: Lennart Pall, LCSW Nurse Present: Leonette Nutting, RN PT Present: Roderic Ovens, PT OT Present: Other (comment)(Sandra Rosana Hoes, OT) SLP Present: Stormy Fabian, SLP PPS Coordinator present : Ileana Ladd, PT     Current Status/Progress Goal Weekly Team Focus  Medical   Functional and mobility deficits secondary to right BKA/PAD with revision to right AKA, complicated by SIRS  Improve  mobility, wounds, DM, BP  See above   Bowel/Bladder   HD-anuric/continent of bowel  remain continent  assess continence and need for prn medication   Swallow/Nutrition/ Hydration             ADL's   Max A LB ADLs, CGA slideboard, mod stand pivots, desaturations on RA  (S) overall   ADL retraining, ADL transfers, d/c planning, R residual limb positioning/desensitization, BUE strengthening, w/c propulsion     Mobility   CGA to S slide board with cues for set up, min A stand pivot with RW, S 150' w/c mobility, still with desaturations in therapy on RA  mod I bed mobility, S transfers (stand pivot and slide board), mod I w/c mobility, CGA car transfers, no gait  set up for transfers, sitting balance, general strengthening, amputee exercises   Communication             Safety/Cognition/ Behavioral Observations  Min A   Mod A   attention, working memory, [employer] and problem solving    Pain   Oxycodone q6  Pain < 3  Assess effectiveness of prn medicine    Skin   LLE DM Ulcer, Healing incision to R AKA site   No new breakdown by d/c and continued healing   Q shift dsg changes and assessment     Rehab Goals Patient on target to meet rehab goals: Yes *See Care Plan and progress notes for long and short-term goals.     Barriers to Discharge  Current Status/Progress Possible Resolutions Date Resolved   Physician    Medical stability;Wound Care;Hemodialysis;Weight bearing restrictions;Other (comments);Lack of/limited family support;Decreased caregiver support  LLE gangrene  See above  Therapies, optimize DM/BP meds, wound care      Nursing                  PT                    OT                  SLP                SW                Discharge Planning/Teaching Needs:  Plan for home with wife and 55 y.o daughter to provide 24/7 assistance.  Will plan teaching closer to d/c date.   Team Discussion:  Wounds still significant;  IV abx have ended.  Silvadene dsgs.  Min assist tfs;  Supervision sl board and supervision w/c level goals.  Max LB ADLs and ay need to plan  for these to done at bed level.  Need to f/u with [employer] about shrinker.  Reviewed DME and f/u needs.  Revisions to Treatment Plan:  NA    Continued Need for Acute Rehabilitation Level of Care: The patient requires daily medical management by a physician with specialized training in physical medicine and rehabilitation for the  following conditions: Daily direction of a multidisciplinary physical rehabilitation program to ensure safe treatment while eliciting the highest outcome that is of practical value to the patient.: Yes Daily medical management of patient stability for increased activity during participation in an intensive rehabilitation regime.: Yes Daily analysis of laboratory values and/or radiology reports with any subsequent need for medication adjustment of medical intervention for : Post surgical problems;Diabetes problems;Wound care problems;Blood pressure problems;Renal problems   I attest that I was present, lead the team conference, and concur  with the assessment and plan of the team.   Lennart Pall 08/25/2018, 2:37 PM    Team conference was held via web/ teleconference due to [place] - 19

## 2018-08-24 NOTE — Progress Notes (Signed)
[facility] Progress Note   Subjective:  Seen in room - denies problems with HD yesterday Objective Vitals:   08/23/18 2008 08/24/18 0442 08/24/18 0443 08/24/18 0445  BP: 126/78  133/65   Pulse: 88  80   Resp: 19  18   Temp: 99.4 F (37.4 C)  98.9 F (37.2 C)   TempSrc: Oral  Oral   SpO2: 95%   92%  Weight:  95.2 kg    Height:       Physical Exam General:NAD. Aware/alert. Sitting on side of bed breathing easily Heart:RRR; 2/6 murmur Lungs:CTAB Abdomen:soft Extremities:R AKA with sock - no edema LLE tr 1+   edema; scattered wounds - large wound bandaged/not examined (see photos under "media")-  Dialysis Access:LUE AVF + thrill  Additional Objective Labs: Basic Metabolic Panel: Recent Labs  Lab 08/18/18 0435 08/18/18 1133 08/23/18 1243  NA 129* 128* 127*  K 4.1 4.4 5.5*  CL 93* 91* 91*  CO2 22 25 21*  GLUCOSE 131* 156* 81  BUN 52* 55* 98*  CREATININE 8.91* 9.19* 14.58*  CALCIUM 9.3 9.3 9.4  PHOS  --  2.9 3.6   Liver Function Tests: Recent Labs  Lab 08/18/18 0435 08/18/18 1133 08/23/18 1243  AST 14*  --   --   ALT 8  --   --   ALKPHOS 101  --   --   BILITOT 0.6  --   --   PROT 7.9  --   --   ALBUMIN 2.3* 2.2* 2.2*   CBC: Recent Labs  Lab 08/18/18 0435 08/18/18 1133 08/23/18 1243  WBC 9.4 9.9 8.4  NEUTROABS 5.9  --   --   HGB 8.9* 9.0* 8.3*  HCT 28.3* 28.5* 26.0*  MCV 90.7 91.1 89.7  PLT 229 243 267   CBG: Recent Labs  Lab 08/23/18 0645 08/23/18 1811 08/23/18 1913 08/23/18 2030 08/24/18 0612  GLUCAP 75 54* 72 113* 132*   Medications:  . brimonidine  1 drop Both Eyes BID  . calcium acetate  1,334 mg Oral TID WC  . Chlorhexidine Gluconate Cloth  6 each Topical Q0600  . darbepoetin (ARANESP) injection - DIALYSIS  150 mcg Intravenous Q Wed-HD  . docusate sodium  100 mg Oral BID  . fluticasone  1 spray Each Nare Daily  . heparin  5,000 Units Subcutaneous Q8H  . latanoprost  1 drop Both Eyes QHS  . metoCLOPramide   5 mg Oral BID AC  . metoprolol succinate  50 mg Oral QHS  . pantoprazole  40 mg Oral Daily  . sevelamer carbonate  800 mg Oral TID WC  . silver sulfADIAZINE   Topical Daily  . sodium chloride flush  3 mL Intravenous Q12H  . timolol  1 drop Both Eyes BID    Dialysis Orders: SW MWF 4h 32min 450/2x 95.5kg 2/2.25 bath P2 Hep 9000 L AVF -Parsabiv 10mg  TIW -Mircera 150 mcg IV q 2 weeks (last 5/4)  Assessment/Plan: 1. 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 Sharol Given. -  2. LLE gangrene (new): Concern that was infected; BCx drawn 5/26 negative. . Dr. Sharol Given consulted and has recommended L AKA in the future - no urgent need, not felt to be infected. 3. ESRD:  MWF- next HD Friday 4. Hypertension/volume: BP controlled, will need to lower EDW on discharge. Continue to serially lower volume - net UF 3.9 Wed with post wt 94.4.  Had CXR pre HD yesterday due to SOB which SOB - no  acute findings - lower edw for d/c - needs more volume off 5. Anemia: Hgb9.0 5/29 > 8.3 6/3 - .Continue Aranesp170mcg q Monday. tsat 22% 5/22/ferritin 1300s - likely inflammatory- had 2 doses ferrlicit  Increase next to 200 if hgb remains down 6. Metabolic bone disease: Corr Ca slightly high and Phos low - Reduced Ca acetate binder.to 2 ac 5/30 - Continue Renvela also as binder. Resume Parsabiv after d/c .  7. T2DM:Insulin per primary. Hypoglycemia overnight 5/25 - glucotrol stopped. 8. Twitchiness: Improved with holding gabapentin. 9. AMS (5/26 - 5/27): Hypoglycemia v. meds v. delirium v. L heel gangrene - resolved; more engaging for the past several days  10. Disp d/c from CIR planned for 6/10  [facility], PA-C 08/24/2018, 9:53 AM  [facility] Pager: [phone]

## 2018-08-24 NOTE — Progress Notes (Signed)
[facility] PHYSICAL MEDICINE & REHABILITATION PROGRESS NOTE  Subjective/Complaints: Patient seen sitting up in bed this morning.  He states he slept well overnight.  His oxygen is hanging off his ear.  He wants to know his discharge date.  He has difficulty understanding his therapy scheduled for today.  ROS: Denies CP, shortness of breath, nausea, vomiting, diarrhea.  Objective: Vital Signs: Blood pressure (!) 170/47, pulse 84, temperature 98.6 F (37 C), temperature source Oral, resp. rate 16, height 6' (1.829 m), weight 95.2 kg, SpO2 100 %. Dg Chest 2 View  Result Date: 08/23/2018 CLINICAL DATA:  Hypoxia EXAM: CHEST - 2 VIEW COMPARISON:  08/15/2018 FINDINGS: Cardiac shadow is mildly prominent. Lungs are well aerated bilaterally with mild bibasilar chronic scarring stable from the previous exam. Vascular stents on the left are again seen and stable. No new bony abnormality is noted. IMPRESSION: Bibasilar scarring.  No acute abnormality noted. Electronically Signed   By: Inez Catalina M.D.   On: 08/23/2018 12:18   Recent Labs    08/23/18 1243  WBC 8.4  HGB 8.3*  HCT 26.0*  PLT 267   Recent Labs    08/23/18 1243  NA 127*  K 5.5*  CL 91*  CO2 21*  GLUCOSE 81  BUN 98*  CREATININE 14.58*  CALCIUM 9.4    Physical Exam: BP (!) 170/47 (BP Location: Right Wrist)   Pulse 84   Temp 98.6 F (37 C) (Oral)   Resp 16   Ht 6' (1.829 m)   Wt 95.2 kg   SpO2 100%   BMI 28.46 kg/m  Constitutional: No distress . Vital signs reviewed. HENT: Normocephalic.  Atraumatic. Eyes: EOMI.  No discharge. Cardiovascular: No JVD. Respiratory: Normal effort.  + [place]. GI: Non-distended. Musc: Right AKA with edema and tenderness Neurological: Alert Motor: Bilateral upper extremities: 5/5 proximal distal, except for left digit contractures Right lower extremity: Hip flexion 4+/5, unchanged Left lower extremity: 4/5 proximal to distal, unchanged Skin:Left lower extremity with necrotic changes,  stable.  Please see media tab.  RLE: with dressing C/D/I.  Psychiatric:Flat  Assessment/Plan: 1. 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  Physiatrist is providing close team supervision and 24 hour management of active medical problems listed below.  Physiatrist and rehab team continue to assess barriers to discharge/monitor patient progress toward functional and medical goals  Care Tool:  Bathing  Bathing activity did not occur: Refused Body parts bathed by patient: Right arm, Left arm, Chest, Abdomen, Face, Front perineal area, Right upper leg, Left upper leg, Left lower leg   Body parts bathed by helper: Buttocks Body parts n/a: Right lower leg   Bathing assist Assist Level: Moderate Assistance - Patient 50 - 74%     Upper Body Dressing/Undressing Upper body dressing   What is the patient wearing?: Pull over shirt    Upper body assist Assist Level: Supervision/Verbal cueing    Lower Body Dressing/Undressing Lower body dressing    Lower body dressing activity did not occur: Refused What is the patient wearing?: Pants, Underwear/pull up     Lower body assist Assist for lower body dressing: Maximal Assistance - Patient 25 - 49%     Toileting Toileting Toileting Activity did not occur (Clothing management and hygiene only): (pt did not need to toilet this session)  Toileting assist Assist for toileting: Maximal Assistance - Patient 25 -  49%     Transfers Chair/bed transfer  Transfers assist     Chair/bed transfer assist level: Minimal Assistance - Patient > 75% Chair/bed transfer assistive device: Walker, Sliding board   Locomotion Ambulation   Ambulation assist   Ambulation activity did not occur: Safety/medical concerns          Walk 10 feet activity   Assist  Walk 10 feet activity did not occur:  Safety/medical concerns        Walk 50 feet activity   Assist Walk 50 feet with 2 turns activity did not occur: Safety/medical concerns         Walk 150 feet activity   Assist Walk 150 feet activity did not occur: Safety/medical concerns         Walk 10 feet on uneven surface  activity   Assist Walk 10 feet on uneven surfaces activity did not occur: Safety/medical concerns         Wheelchair     Assist Will patient use wheelchair at discharge?: Yes Type of Wheelchair: Manual    Wheelchair assist level: Supervision/Verbal cueing Max wheelchair distance: 64'    Wheelchair 50 feet with 2 turns activity    Assist        Assist Level: Supervision/Verbal cueing   Wheelchair 150 feet activity     Assist     Assist Level: Supervision/Verbal cueing      Medical Problem List and Plan: 1.Functional and mobility deficitssecondary to right BKA/PADwith revision to right AKA, complicated by SIRS             Continue CIR  Stump shrinker ordered 2. Antithrombotics: -DVT/anticoagulation:Pharmaceutical:Heparin -antiplatelet therapy: N/A 3. Pain Management:             Continue gabapentin              Continue oxycodone prn.   Robaxin twice daily as needed started on 5/30  Relatively controlled on 6/4 4. Mood:LCSW to follow for evaluation and support. -antipsychotic agents: N/A 5. Neuropsych: This patientis not fullycapable of making decisions on hisown behalf.  DCed gabapentin and Ultram 6. Skin/Wound Care:             Discussed with Ortho and PA previously- Silvadene dressing.  Severe PVD with limited blood flow per vascular.  Discussed with nursing  Monitor right lower extremity  Monitor left lower extremity dry gangrene 7. Fluids/Electrolytes/Nutrition:Strict I/O.  8. T2DM: Hemoglobin A1c 10.1 on 5/18.  Continue to monitor BS ac/hs and use SSI for elevated BS.   Labile and trending up on 6/4              Monitor with increased mobility 9. ESRD: HD MWF. Schedule atend of the day to allow participation with therapies during the day and to assistwith activity tolerance.              Recs per nephro 10. Diabetic gastroparesis: On Reglan tid ac.  Will consider decreasing if necessary 11. Anemia of chronic disease: Continue Aranesp weekly for supplement.              Hemoglobin 8.3 on 6/3             Continue to monitor 12. Glaucoma: Managed with Timoptic, Alphagan and Xalatan. 13.  Labile blood pressure  Labile on 6/4  Monitor with increased mobility 14.  Constipation immobility with opioids  Continue bowel meds 15.  SIRS: Resolved  D/ced IV Vanc, cefepime, Flagyl.   Blood cultures no growth. 16.  Supplemental oxygen dependent  Chest x-ray reviewed- unremarkable for acute infectious process.  Wean as tolerated  LOS: 7 days A FACE TO FACE EVALUATION WAS PERFORMED  Yaneth Fairbairn Lorie Phenix 08/24/2018, 3:49 PM

## 2018-08-24 NOTE — Progress Notes (Signed)
Physical Therapy Session Note  Patient Details  Name: Howard Bell MRN: [id number] Date of Birth: 12/14/1965  Today's Date: 08/24/2018 PT Individual Time: 1006-1059 PT Individual Time Calculation (min): 53 min   Short Term Goals: Week 1:  PT Short Term Goal 1 (Week 1): Patient to demonstrate dynamic sitting balance with S.  PT Short Term Goal 2 (Week 1): Patient to perform side board transfer with S including set up. PT Short Term Goal 3 (Week 1): Patient to propel w/c mod I on level indoor surfaces. PT Short Term Goal 4 (Week 1): Patient to perform sit to stand with CGA in prep for car transfer to SUV  Skilled Therapeutic Interventions/Progress Updates:   Patient received sitting EOB and agreeable to therapy session with encouragement. Assessed vitals: BP 170/66 (MAP 92) HR 77bpm, SpO2 78% on room air with pt noted to have taken off nasal cannula - therapist reapplied the 2L of O2 and educated pt on importance of keeping the nasal cannula on - SpO2 increased to 92%-95%. RN present for medication administration and notified of SpO2 levels. Pt noted to have increased verbosity with poor topic maintenance requiring max cuing for attention and redirection throughout session. Pt donned LB clothing with max assist for threading L LE and performed sit>stand elevated EOB>RW with heavy mod assist for lifting and balance while therapist completed LB clothing total assist. Performed R lateral scoot transfer (pt deferred using transfer board) EOB>w/c with mod assist for scooting and trunk control. Performed B UE w/c propulsion for ~117ft with supervision and cuing for obstacle navigation on the L. Sit<>stand x 2 w/c<>RW with heavy mod assist for lifting/lowering and standing balance with pt able to tolerate static standing for 45 seconds prior to returning to sit - demonstrated heavy reliance on B UE support for standing. SpO2 decreased to 85% after standing despite O2 via nasal cannula but with seated rest  break increased to 95%. Pt reports B LE pain with increased L LE pain in standing therefore limited standing times for pain management. Transported back to room in w/c and left sitting in w/c with needs in reach, seat belt alarm on, and SLP present to work with patient.  Therapy Documentation Precautions:  Precautions Precautions: Fall Restrictions Weight Bearing Restrictions: Yes RLE Weight Bearing: Non weight bearing  Pain:   Reports BLE pain with increased LLE pain during standing - therapy performed to tolerance and additional details above.   Therapy/Group: Individual Therapy  Tawana Scale, PT, DPT 08/24/2018, 7:59 AM

## 2018-08-24 NOTE — Progress Notes (Signed)
Occupational Therapy Session Note  Patient Details  Name: Howard Bell MRN: [id number] Date of Birth: 08/25/1965  Today's Date: 08/24/2018 OT Individual Time: 1330-1445 OT Individual Time Calculation (min): 75 min    Short Term Goals: Week 2:  OT Short Term Goal 1 (Week 2): Patient will complete toileting with Min assist  OT Short Term Goal 2 (Week 2): Patient will complete toilet transfers with Min assist and appropriate DME as needed  Skilled Therapeutic Interventions/Progress Updates:    Pt agreeable to participate in OT session focusing on BUE strengthening and endurance in order to increase functional performance during ADL tasks and functional transfers. Patient completed strengthening seated with 6# hand weights for 10-15 repetitions for shoulder and elbow ranges. Rest breaks taken when needed. Pt was able to self propel wheelchair from room to table next to nurse's station and back to room at end of session with increased time. Pt transferred from wheelchair to bed by completing a sqt pivot transfer towards his left side at Contact guard assist (per patient's request. He declined use of slideboard). Pt was left in bed with bed alarm set and call light within reach.   Therapy Documentation Precautions:  Precautions Precautions: Fall Restrictions Weight Bearing Restrictions: Yes RLE Weight Bearing: Non weight bearing Pain: Pain Assessment Pain Scale: 0-10 Pain Score: 4  Pain Location: Leg Pain Orientation: Right Pain Descriptors / Indicators: Aching;Discomfort Pain Onset: On-going Pain Intervention(s): Medication (See eMAR)   Therapy/Group: Individual Therapy  Ailene Ravel, OTR/L,CBIS  [phone]  08/24/2018, 3:11 PM

## 2018-08-25 ENCOUNTER — Inpatient Hospital Stay (HOSPITAL_COMMUNITY): Payer: Medicare Other | Admitting: Speech Pathology

## 2018-08-25 ENCOUNTER — Inpatient Hospital Stay (HOSPITAL_COMMUNITY): Payer: Medicare Other | Admitting: Occupational Therapy

## 2018-08-25 ENCOUNTER — Inpatient Hospital Stay (HOSPITAL_COMMUNITY): Payer: Medicare Other | Admitting: Physical Therapy

## 2018-08-25 LAB — CBC
HCT: 33.1 % — ABNORMAL LOW (ref 39.0–52.0)
Hemoglobin: 10.7 g/dL — ABNORMAL LOW (ref 13.0–17.0)
MCH: 28.8 pg (ref 26.0–34.0)
MCHC: 32.3 g/dL (ref 30.0–36.0)
MCV: 89.2 fL (ref 80.0–100.0)
Platelets: 219 10*3/uL (ref 150–400)
RBC: 3.71 MIL/uL — ABNORMAL LOW (ref 4.22–5.81)
RDW: 16.2 % — ABNORMAL HIGH (ref 11.5–15.5)
WBC: 6.3 10*3/uL (ref 4.0–10.5)
nRBC: 0 % (ref 0.0–0.2)

## 2018-08-25 LAB — BASIC METABOLIC PANEL
Anion gap: 14 (ref 5–15)
BUN: 55 mg/dL — ABNORMAL HIGH (ref 6–20)
CO2: 23 mmol/L (ref 22–32)
Calcium: 8.7 mg/dL — ABNORMAL LOW (ref 8.9–10.3)
Chloride: 89 mmol/L — ABNORMAL LOW (ref 98–111)
Creatinine, Ser: 10.67 mg/dL — ABNORMAL HIGH (ref 0.61–1.24)
GFR calc Af Amer: 6 mL/min — ABNORMAL LOW (ref >60)
GFR calc non Af Amer: 5 mL/min — ABNORMAL LOW (ref >60)
Glucose, Bld: 132 mg/dL — ABNORMAL HIGH (ref 70–99)
Potassium: 5.3 mmol/L — ABNORMAL HIGH (ref 3.5–5.1)
Sodium: 126 mmol/L — ABNORMAL LOW (ref 135–145)

## 2018-08-25 LAB — GLUCOSE, CAPILLARY
Glucose-Capillary: 113 mg/dL — ABNORMAL HIGH (ref 70–99)
Glucose-Capillary: 164 mg/dL — ABNORMAL HIGH (ref 70–99)
Glucose-Capillary: 88 mg/dL (ref 70–99)
Glucose-Capillary: 132 mg/dL — ABNORMAL HIGH (ref 70–99)

## 2018-08-25 MED ORDER — HEPARIN SODIUM (PORCINE) 1000 UNIT/ML DIALYSIS: 6000.0000 [IU] | Freq: Once | INTRAMUSCULAR | Status: DC

## 2018-08-25 MED ORDER — SODIUM CHLORIDE 0.9 % IV SOLN: 100.0000 mL | INTRAVENOUS | Status: DC | PRN

## 2018-08-25 MED ORDER — ALTEPLASE 2 MG IJ SOLR: 2.0000 mg | Freq: Once | INTRAMUSCULAR | Status: DC | PRN

## 2018-08-25 MED ORDER — HEPARIN SODIUM (PORCINE) 1000 UNIT/ML DIALYSIS: 1000.0000 [IU] | INTRAMUSCULAR | Status: DC | PRN

## 2018-08-25 MED ORDER — LIDOCAINE HCL (PF) 1 % IJ SOLN: 5.0000 mL | INTRAMUSCULAR | Status: DC | PRN

## 2018-08-25 MED ORDER — LIDOCAINE-PRILOCAINE 2.5-2.5 % EX CREA: 1.0000 "application " | TOPICAL_CREAM | CUTANEOUS | Status: DC | PRN

## 2018-08-25 MED ORDER — PENTAFLUOROPROP-TETRAFLUOROETH EX AERO: 1.0000 "application " | INHALATION_SPRAY | CUTANEOUS | Status: DC | PRN

## 2018-08-25 NOTE — Progress Notes (Signed)
Occupational Therapy Weekly Progress Note  Patient Details  Name: Howard Bell MRN: [id number] Date of Birth: [birth year]  Beginning of progress report period: Aug 18, 2018 End of progress report period: August 25, 2018  Today's Date: 08/25/2018 OT Individual Time: 1015-1100 OT Individual Time Calculation (min): 45 min    Patient has met 1 of 2 short term goals.  Pt has not met his STG of toileting with min A as he still has great difficulty with his lateral leans for clothing management and with sit to stand (Max A). He can now transfer on and off a drop arm BSC with a slide board or squat pivot with CGA.   Patient continues to demonstrate the following deficits: muscle weakness, decreased cardiorespiratoy endurance, decreased awareness, decreased problem solving, decreased memory and delayed processing and decreased standing balance and decreased balance strategies and therefore will continue to benefit from skilled OT intervention to enhance overall performance with BADL and Reduce care partner burden.  Patient not progressing toward long term goals.  See goal revision..  Plan of care revisions: LTGs downgraded for bathing to min A, LB dressing and toileting to Mod A.  the tub transfer goal has been discontinued at pt is not able to shower at this time and he states that his w/c will not fit in bathroom door. Pt will not be safe to ambulate with his spouse. .  OT Short Term Goals Week 1:    Week 2:  OT Short Term Goal 1 (Week 2): Patient will complete toileting with Min assist  OT Short Term Goal 1 - Progress (Week 2): Not progressing OT Short Term Goal 2 (Week 2): Patient will complete toilet transfers with Min assist and appropriate DME as needed OT Short Term Goal 2 - Progress (Week 2): Met Week 3:  OT Short Term Goal 1 (Week 3): STGs = LTGs.  LTGs revised 08/25/18.  Skilled Therapeutic Interventions/Progress Updates:    Pt received in w/c on 2L of O2.  He strongly declined to bathe or  work on LB dressing. He did agree to change shirts.     Pt stated a w/c will not fit in his BR door at home so he will need to use a BSC. Place wide drop arm BSC next to w/c and pt wanted to use a squat pivot vs the board. He was able to transfer with CGA.  Prior to transferring pt very concerned that he would "fall through the hole", reassured pt the hole was much smaller than his bottom.    Pt declined working on lateral leans and just wanted to do arm exercises.    Pt worked on Presenter, broadcasting to gym with good pace.  He worked on a variety of UE exercises with mod verbal cues.  He did 5 chair pushups but stated it hurt his R leg too much as the "blood was flowing down".    Pt propelled back to room. Speech therapist arrived for his next session.  Pt will need to have family ed with his spouse prior to discharge home.    Therapy Documentation Precautions:  Precautions Precautions: Fall Restrictions Weight Bearing Restrictions: Yes RLE Weight Bearing: Non weight bearing      Pain: Pain Assessment Pain Score: 3  Pain Type: Surgical pain;Phantom pain Pain Location: Leg Pain Orientation: Right Pain Descriptors / Indicators: Aching;Discomfort Pain Onset: On-going Pain Intervention(s): Rest      Therapy/Group: Individual Therapy  SAGUIER,JULIA 08/25/2018, 12:26 PM

## 2018-08-25 NOTE — Progress Notes (Signed)
Physical Therapy Session Note  Patient Details  Name: Howard Bell MRN: [id number] Date of Birth: 11-May-1965  Today's Date: 08/25/2018 PT Individual Time: 5621-3086 PT Individual Time Calculation (min): 70 min   Short Term Goals: Week 1:  PT Short Term Goal 1 (Week 1): Patient to demonstrate dynamic sitting balance with S.  PT Short Term Goal 2 (Week 1): Patient to perform side board transfer with S including set up. PT Short Term Goal 3 (Week 1): Patient to propel w/c mod I on level indoor surfaces. PT Short Term Goal 4 (Week 1): Patient to perform sit to stand with CGA in prep for car transfer to SUV  Skilled Therapeutic Interventions/Progress Updates:    Pt received sitting on EOB & agreeable to tx. Pt denies pain at rest and does not have nasal cannula donned, SpO2 = 82% on room air. Educated pt on need for supplemental oxygen & donned nasal cannula at 2L/min and SpO2 increased to 88-94% with extra time & cuing to inhale through nose.  Pt reports feeling depressed & not wanting to be here with therapist providing encouragement. Pt transfers bed>w/c at equal height with slide board (dependent assist for slide board placement) with supervision for slide board transfer, SpO2 = 86% after on 2L/min with cuing for pursed lip breathing & SpO2 increased to 88-91%. Increased supplemental oxygen to 3L/min & SpO2 increased to 96-97%.  Pt's SpO2 remained WNL when intermittently checked throughout session & pt on 3L/min. Pt propels w/c room<>mat table by windows with BUE & supervision and significantly extra time. Pt demonstrates decreased ability to laterally lean in w/c but transfers w/c<>mat table with CGA/supervision with cuing 1x for technique. From EOM pt transfers sit<>stand with min assist with therapist providing cuing for hand placement and pt only able to stand <20 seconds x 3 trials 2/2 fatigue and min assist for standing balance. Pt transferred to prone on mat table with min assist and only  able to tolerate prone ~2 minutes for R hip flexor stretch. From L sidelying pt performed RLE hip extension with manual facilitation to maintain neutral body alignment and multimodal cuing for technique with pt with minimal hip extension observed. At end of session pt left sitting in w/c with chair alarm donned, SpO2 on 3L/min, and all needs in reach.   Therapy Documentation Precautions:  Precautions Precautions: Fall Restrictions Weight Bearing Restrictions: Yes RLE Weight Bearing: Non weight bearing      Therapy/Group: Individual Therapy  Waunita Schooner 08/25/2018, 9:18 AM

## 2018-08-25 NOTE — Progress Notes (Signed)
Subjective:  Sitting in wc in room no cos  for hd today   Objective Vital signs in last 24 hours: Vitals:   08/24/18 1517 08/24/18 1934 08/24/18 2115 08/25/18 0516  BP: (!) 170/47 95/79 (!) 148/65 (!) 158/74  Pulse: 84 84 78 74  Resp: 16 16  18   Temp: 98.6 F (37 C) 98 F (36.7 C)  98.9 F (37.2 C)  TempSrc: Oral Oral  Oral  SpO2: 100% (!) 89%  90%  Weight:      Height:       Weight change:   Physical Exam General:alert NAD obese male  Heart:RRR; 2/6 murmur Lungs:CTAB Abdomen:soft, Nt, ND Extremities:R AKA with sock - no edema LLE tr  edema; scattered wounds - large wound bandaged/not examined (see photos under "media")-  Dialysis Access:LUE AVF + bruit Dialysis Orders: SW MWF 4h 67min 450/2x 95.5kg 2/2.25 bath P2 Hep 9000 L AVF -Parsabiv 10mg  TIW -Mircera 150 mcg IV q 2 weeks (last 5/4)  Problem/Plan: 1. 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 Sharol Given. - Now in REHAB  2. LLE gangrene (new): Concern that was infected; BCx drawn 5/26 negative. . Dr. Sharol Given consulted and has recommended L AKA in the future - no urgent need, not felt to be infected. 3. ESRD:  MWF- next HD  today 4. Hypertension/volume: BP controlled, will need to lower EDW on discharge. Continue to serially lower volume - net UF 3.9 Wed with post wt 94.4.  Had CXR pre HD 6/03  due to SOB which SOB - no acute findings - needs more volume off 5. Anemia: Hgb9.0 5/29 > 8.3 6/3 - .Continue Aranesp16mcg q Monday. tsat 22% 5/22/ferritin 1300s - likely inflammatory- had 2 doses ferrlicit  Increase next to 200 if hgb remains down 6. Metabolic bone disease: Corr Ca slightly high and Phos low - Reduced Ca acetate binder.to 2 ac 5/30 - Continue Renvela also as binder. Resume Parsabiv after d/c .  7. T2DM:Insulin per primary. Hypoglycemia overnight 5/25 - glucotrol stopped. 8. Twitchiness: Improved with holding gabapentin. 9. AMS (5/26 - 5/27): Hypoglycemia v. meds v. delirium v. L heel gangrene -  resolved;  10. Disp d/c from CIR planned for 6/10  Ernest Haber, PA-C [facility] Beeper [phone] 08/25/2018,9:49 AM  LOS: 8 days   Labs: Basic Metabolic Panel: Recent Labs  Lab 08/18/18 1133 08/23/18 1243 08/25/18 0508  NA 128* 127* 126*  K 4.4 5.5* 5.3*  CL 91* 91* 89*  CO2 25 21* 23  GLUCOSE 156* 81 132*  BUN 55* 98* 55*  CREATININE 9.19* 14.58* 10.67*  CALCIUM 9.3 9.4 8.7*  PHOS 2.9 3.6  --    Liver Function Tests: Recent Labs  Lab 08/18/18 1133 08/23/18 1243  ALBUMIN 2.2* 2.2*   No results for input(s): LIPASE, AMYLASE in the last 168 hours. No results for input(s): AMMONIA in the last 168 hours. CBC: Recent Labs  Lab 08/18/18 1133 08/23/18 1243  WBC 9.9 8.4  HGB 9.0* 8.3*  HCT 28.5* 26.0*  MCV 91.1 89.7  PLT 243 267   Cardiac Enzymes: No results for input(s): CKTOTAL, CKMB, CKMBINDEX, TROPONINI in the last 168 hours. CBG: Recent Labs  Lab 08/24/18 0612 08/24/18 1135 08/24/18 1647 08/24/18 2140 08/25/18 0645  GLUCAP 132* 210* 154* 152* 113*    Studies/Results: Dg Chest 2 View  Result Date: 08/23/2018 CLINICAL DATA:  Hypoxia EXAM: CHEST - 2 VIEW COMPARISON:  08/15/2018 FINDINGS: Cardiac shadow is mildly prominent. Lungs are well aerated bilaterally  with mild bibasilar chronic scarring stable from the previous exam. Vascular stents on the left are again seen and stable. No new bony abnormality is noted. IMPRESSION: Bibasilar scarring.  No acute abnormality noted. Electronically Signed   By: Inez Catalina M.D.   On: 08/23/2018 12:18   Medications:  . brimonidine  1 drop Both Eyes BID  . calcium acetate  1,334 mg Oral TID WC  . Chlorhexidine Gluconate Cloth  6 each Topical Q0600  . darbepoetin (ARANESP) injection - DIALYSIS  150 mcg Intravenous Q Wed-HD  . docusate sodium  100 mg Oral BID  . fluticasone  1 spray Each Nare Daily  . heparin  5,000 Units Subcutaneous Q8H  . insulin aspart  0-7 Units Subcutaneous TID WC  . latanoprost   1 drop Both Eyes QHS  . metoCLOPramide  5 mg Oral BID AC  . metoprolol succinate  50 mg Oral QHS  . pantoprazole  40 mg Oral Daily  . sevelamer carbonate  800 mg Oral TID WC  . silver sulfADIAZINE   Topical Daily  . sodium chloride flush  3 mL Intravenous Q12H  . timolol  1 drop Both Eyes BID

## 2018-08-25 NOTE — Progress Notes (Signed)
  Pt:  Howard Bell MRN:  [id number]   Diagnosis codes:  Z47.81;  N18.6; I13.52  Height:   6'          Weight:    203 lbs        Patient suffers from right AKA, ESRD and CHF  which impairs their ability to perform daily activities like dressing, bathing and mobility in the home.  A walker  will not resolve issue with performing activities of daily living.  A wheelchair will allow patient to safely perform daily activities.  Patient is not able to propel themselves in the home using a standard weight wheelchair due to general weakness.  Patient can self propel in the lightweight wheelchair.   Reesa Chew, PA-C

## 2018-08-25 NOTE — Procedures (Signed)
Patient seen on Hemodialysis. BP (!) 158/74 (BP Location: Right Arm)   Pulse 74   Temp 98.9 F (37.2 C) (Oral)   Resp 18   Ht 6' (1.829 m)   Wt 95.2 kg   SpO2 90%   BMI 28.46 kg/m   QB 400, UF goal 3.5L Tolerating treatment without complaints at this time.   Elmarie Shiley MD [facility]. Office # [phone] Pager # [phone] 2:09 PM

## 2018-08-25 NOTE — Plan of Care (Signed)
  Problem: RH BOWEL ELIMINATION Goal: RH STG MANAGE BOWEL WITH ASSISTANCE Description STG Manage Bowel with mod Assistance.  Outcome: Progressing Flowsheets (Taken 08/25/2018 1808) STG: Pt will manage bowels with assistance: 4-Minimum assistance Goal: RH STG MANAGE BOWEL W/MEDICATION W/ASSISTANCE Description STG Manage Bowel with Medication with mod I Assistance.   Outcome: Progressing Flowsheets (Taken 08/25/2018 1808) STG: Pt will manage bowels with medication with assistance: 3-Moderate assistance   Problem: RH SKIN INTEGRITY Goal: RH STG SKIN FREE OF INFECTION/BREAKDOWN Description Patient will not develop new skin breakdown entire stay on rehab with min assist  Outcome: Progressing Goal: RH STG MAINTAIN SKIN INTEGRITY WITH ASSISTANCE Description STG Maintain Skin Integrity With  Mod Assistance.  Outcome: Progressing Goal: RH STG ABLE TO PERFORM INCISION/WOUND CARE W/ASSISTANCE Description STG Able To Perform Incision/Wound Care With mod Assistance.  Outcome: Progressing Flowsheets (Taken 08/25/2018 1808) STG: Pt will be able to perform incision/wound care with assistance: 2-Maximum assistance   Problem: RH SAFETY Goal: RH STG ADHERE TO SAFETY PRECAUTIONS W/ASSISTANCE/DEVICE Description STG Adhere to Safety Precautions With  Min Assistance/Device.   Outcome: Progressing Flowsheets (Taken 08/25/2018 1808) STG:Pt will adhere to safety precautions with assistance/device: 4-Minimal assistance Goal: RH STG DECREASED RISK OF FALL WITH ASSISTANCE Description STG Decreased Risk of Fall With min Assistance.   Outcome: Progressing Flowsheets (Taken 08/25/2018 1808) JJH:ERDEYCXKG risk of fall  with assistance/device: 3-Moderate assistance   Problem: RH PAIN MANAGEMENT Goal: RH STG PAIN MANAGED AT OR BELOW PT'S PAIN GOAL Description Pain less than 2  Outcome: Progressing   Problem: RH KNOWLEDGE DEFICIT LIMB LOSS Goal: RH STG INCREASE KNOWLEDGE OF SELF CARE AFTER LIMB  LOSS Description Increase self care after limb loss  with mod assistance  Outcome: Progressing

## 2018-08-25 NOTE — Progress Notes (Signed)
[facility] PHYSICAL MEDICINE & REHABILITATION PROGRESS NOTE  Subjective/Complaints: Patient seen sitting up at the edge of his bed this morning.  He states he slept well overnight.  Good sitting balance noted.  Discussed strength with nursing.  ROS: Denies CP, shortness of breath, nausea, vomiting, diarrhea.  Objective: Vital Signs: Blood pressure (!) 158/74, pulse 74, temperature 98.9 F (37.2 C), temperature source Oral, resp. rate 18, height 6' (1.829 m), weight 95.2 kg, SpO2 90 %. Dg Chest 2 View  Result Date: 08/23/2018 CLINICAL DATA:  Hypoxia EXAM: CHEST - 2 VIEW COMPARISON:  08/15/2018 FINDINGS: Cardiac shadow is mildly prominent. Lungs are well aerated bilaterally with mild bibasilar chronic scarring stable from the previous exam. Vascular stents on the left are again seen and stable. No new bony abnormality is noted. IMPRESSION: Bibasilar scarring.  No acute abnormality noted. Electronically Signed   By: Inez Catalina M.D.   On: 08/23/2018 12:18   Recent Labs    08/23/18 1243  WBC 8.4  HGB 8.3*  HCT 26.0*  PLT 267   Recent Labs    08/23/18 1243 08/25/18 0508  NA 127* 126*  K 5.5* 5.3*  CL 91* 89*  CO2 21* 23  GLUCOSE 81 132*  BUN 98* 55*  CREATININE 14.58* 10.67*  CALCIUM 9.4 8.7*    Physical Exam: BP (!) 158/74 (BP Location: Right Arm)   Pulse 74   Temp 98.9 F (37.2 C) (Oral)   Resp 18   Ht 6' (1.829 m)   Wt 95.2 kg   SpO2 90%   BMI 28.46 kg/m  Constitutional: No distress . Vital signs reviewed. HENT: Normocephalic.  Atraumatic. Eyes: EOMI.  No discharge. Cardiovascular: No JVD. Respiratory: Normal effort.  + [place]. GI: Non-distended. Musc: Right AKA with edema and tenderness, improving Neurological: Alert Motor: Bilateral upper extremities: 5/5 proximal distal, except for left digit contractures Right lower extremity: Hip flexion 4+/5, stable Left lower extremity: 4/5 proximal to distal, stable Skin:Left lower extremity with necrotic changes, stable.   Please see media tab.  RLE: with dressing C/D/I.  Psychiatric:Flat  Assessment/Plan: 1. 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  Physiatrist is providing close team supervision and 24 hour management of active medical problems listed below.  Physiatrist and rehab team continue to assess barriers to discharge/monitor patient progress toward functional and medical goals  Care Tool:  Bathing  Bathing activity did not occur: Refused Body parts bathed by patient: Right arm, Left arm, Chest, Abdomen, Face, Front perineal area, Right upper leg, Left upper leg, Left lower leg   Body parts bathed by helper: Buttocks Body parts n/a: Right lower leg   Bathing assist Assist Level: Moderate Assistance - Patient 50 - 74%     Upper Body Dressing/Undressing Upper body dressing   What is the patient wearing?: Pull over shirt    Upper body assist Assist Level: Supervision/Verbal cueing    Lower Body Dressing/Undressing Lower body dressing    Lower body dressing activity did not occur: Refused What is the patient wearing?: Pants, Underwear/pull up     Lower body assist Assist for lower body dressing: Maximal Assistance - Patient 25 - 49%     Toileting Toileting Toileting Activity did not occur (Clothing management and hygiene only): (pt did not need to toilet this session)  Toileting assist Assist for toileting: Maximal Assistance - Patient 25 - 49%  Transfers Chair/bed transfer  Transfers assist     Chair/bed transfer assist level: Contact Guard/Touching assist Chair/bed transfer assistive device: Sliding board   Locomotion Ambulation   Ambulation assist   Ambulation activity did not occur: Safety/medical concerns          Walk 10 feet activity   Assist  Walk 10 feet activity did not occur: Safety/medical  concerns        Walk 50 feet activity   Assist Walk 50 feet with 2 turns activity did not occur: Safety/medical concerns         Walk 150 feet activity   Assist Walk 150 feet activity did not occur: Safety/medical concerns         Walk 10 feet on uneven surface  activity   Assist Walk 10 feet on uneven surfaces activity did not occur: Safety/medical concerns         Wheelchair     Assist Will patient use wheelchair at discharge?: Yes Type of Wheelchair: Manual    Wheelchair assist level: Supervision/Verbal cueing Max wheelchair distance: 150 ft     Wheelchair 50 feet with 2 turns activity    Assist        Assist Level: Supervision/Verbal cueing   Wheelchair 150 feet activity     Assist     Assist Level: Supervision/Verbal cueing      Medical Problem List and Plan: 1.Functional and mobility deficitssecondary to right BKA/PADwith revision to right AKA, complicated by SIRS             Continue CIR  Stump shrinker ordered, pending 2. Antithrombotics: -DVT/anticoagulation:Pharmaceutical:Heparin -antiplatelet therapy: N/A 3. Pain Management:             Continue gabapentin              Continue oxycodone prn.   Robaxin twice daily as needed started on 5/30  Relatively controlled on 6/5 4. Mood:LCSW to follow for evaluation and support. -antipsychotic agents: N/A 5. Neuropsych: This patientis not fullycapable of making decisions on hisown behalf.  DCed gabapentin and Ultram 6. Skin/Wound Care:             Discussed with Ortho and PA previously- Silvadene dressing.  Severe PVD with limited blood flow per vascular.  Discussed with nursing  Monitor right lower extremity  Monitor left lower extremity dry gangrene 7. Fluids/Electrolytes/Nutrition:Strict I/O.  8. T2DM: Hemoglobin A1c 10.1 on 5/18.  Continue to monitor BS ac/hs and use SSI for elevated BS.   Labile on 6/5  Will avoid aggressive  measures as much as possible due to hypersensitivity and hypoglycemia with medications             Monitor with increased mobility 9. ESRD: HD MWF. Schedule atend of the day to allow participation with therapies during the day and to assistwith activity tolerance.              Labs reviewed from this a.m.-recs per nephro 10. Diabetic gastroparesis: On Reglan tid ac.  Will consider decreasing if necessary 11. Anemia of chronic disease: Continue Aranesp weekly for supplement.              Hemoglobin 8.3 on 6/3             Continue to monitor 12. Glaucoma: Managed with Timoptic, Alphagan and Xalatan. 13.  Labile blood pressure  Remains labile on 6/5  Monitor with increased mobility 14.  Constipation immobility with opioids  Continue bowel meds 15.  SIRS: Resolved  D/ced IV Vanc, cefepime, Flagyl.   Blood cultures no growth. 16.  Supplemental oxygen dependent  Chest x-ray reviewed- unremarkable for acute infectious process.  Wean as tolerated, will require discharge  LOS: 8 days A FACE TO FACE EVALUATION WAS PERFORMED  Renato Spellman Lorie Phenix 08/25/2018, 10:12 AM

## 2018-08-25 NOTE — Progress Notes (Signed)
Speech Language Pathology Weekly Progress and Session Note  Patient Details  Name: Howard Bell MRN: [id number] Date of Birth: [birth year]  Beginning of progress report period: Aug 18, 2018 End of progress report period: August 25, 2018  Today's Date: 08/25/2018 SLP Individual Time: 1105-1200 SLP Individual Time Calculation (min): 55 min  Short Term Goals: Week 1: SLP Short Term Goal 1 (Week 1): STGs=LTGs SLP Short Term Goal 1 - Progress (Week 1): Not met    New Short Term Goals: Week 2: SLP Short Term Goal 1 (Week 2): STGs=LTGs  Weekly Progress Updates: Patient has made inconsistent gains and has not any STGs this reporting period. Currently, patient requires overall Mod A multimodal cues to complete basic and familiar tasks safely in regards to problem solving, recall with use of strategies and attention. Patient and family education is ongoing. Patient would benefit from continued skilled SLP intervention to maximize his cognitive functioning and overall functional independence prior to discharge.    Intensity: Minumum of 1-2 x/day, 30 to 90 minutes Frequency: 3 to 5 out of 7 days Duration/Length of Stay: 08/30/18 Treatment/Interventions: Cognitive remediation/compensation;Cueing hierarchy;Functional tasks;Patient/family education;Therapeutic Activities;Environmental controls;Internal/external aids;Speech/Language facilitation   Daily Session  Skilled Therapeutic Interventions: Skilled treatment session focused on cognitive goals. SLP facilitated session by providing Min A verbal cues for problem solving and organization during a calendar making task that focused on writing down appointments,etc appropriately. However, increased cueing needed for problem solving during a 6-step picture sequencing task due to impaired attention. Overall, patient demonstrated increased attention and problem solving this session. Patient left upright in the wheelchair with alarm on and all needs within  reach. Continue with current plan of care.     Pain No/Denies Pain   Therapy/Group: Individual Therapy  Ratasha Fabre 08/25/2018, 6:30 AM

## 2018-08-26 ENCOUNTER — Inpatient Hospital Stay (HOSPITAL_COMMUNITY): Payer: Medicare Other | Admitting: Speech Pathology

## 2018-08-26 ENCOUNTER — Inpatient Hospital Stay (HOSPITAL_COMMUNITY): Payer: Medicare Other | Admitting: Occupational Therapy

## 2018-08-26 LAB — GLUCOSE, CAPILLARY
Glucose-Capillary: 166 mg/dL — ABNORMAL HIGH (ref 70–99)
Glucose-Capillary: 189 mg/dL — ABNORMAL HIGH (ref 70–99)
Glucose-Capillary: 179 mg/dL — ABNORMAL HIGH (ref 70–99)
Glucose-Capillary: 185 mg/dL — ABNORMAL HIGH (ref 70–99)

## 2018-08-26 NOTE — Progress Notes (Signed)
Occupational Therapy Session Note  Patient Details  Name: Howard Bell MRN: [id number] Date of Birth: 1965/09/17  Today's Date: 08/26/2018 OT Individual Time: 4801-6553 OT Individual Time Calculation (min): 47 min    Short Term Goals: Week 3:  OT Short Term Goal 1 (Week 3): STGs = LTGs.  LTGs revised 08/25/18.  Skilled Therapeutic Interventions/Progress Updates:    Patient seated in w/c, agreeable to therapy session and requests to return to bed at close of session due to fatigue.  Mild confusion noted t/o session but able to follow all directions with increased explanation.  W/c pushups and weight shifts completed w/c level.  Sit pivot transfer to bed with min A.  Unsupported sitting activities with CS.  Sit to stand min a from elevated bed surface using RW x2 - able to maintain standing for approx 1 minute.  SSP to supine with min A.  Supine trunk/abdominal muscle and LE mobility activities completed with min A.  Patient remained in bed at close of session in reclined position with bed alarm set and call bell in reach.    Therapy Documentation Precautions:  Precautions Precautions: Fall Restrictions Weight Bearing Restrictions: Yes RLE Weight Bearing: Non weight bearing General:   Vital Signs: Therapy Vitals Temp: 98 F (36.7 C) Temp Source: Oral Pulse Rate: 82 Resp: 16 BP: (!) 152/69 Patient Position (if appropriate): Sitting Oxygen Therapy SpO2: 91 % O2 Device: Nasal Cannula O2 Flow Rate (L/min): 2 L/min Pain: Pain Assessment Pain Scale: 0-10 Pain Score: 0-No pain   Other Treatments:     Therapy/Group: Individual Therapy  Carlos Levering 08/26/2018, 3:37 PM

## 2018-08-26 NOTE — Progress Notes (Signed)
Howard Bell is a 53 y.o. male 1965/09/06 [id number]  Subjective: Patient denies pain, shortness of breath or new problems.  Reports sleep is fine.  Reserved and disengaged overall with conversation  Objective: Vital signs in last 24 hours: Temp:  [98 F (36.7 C)-98.7 F (37.1 C)] 98 F (36.7 C) (06/06 1320) Pulse Rate:  [62-88] 82 (06/06 1320) Resp:  [16-19] 16 (06/06 1320) BP: (109-152)/(48-99) 152/69 (06/06 1320) SpO2:  [91 %-99 %] 91 % (06/06 1320) Weight:  [94.9 kg] 94.9 kg (06/05 1819) Weight change:  Last BM Date: 08/26/18  Intake/Output from previous day: 06/05 0701 - 06/06 0700 In: 180 [P.O.:180] Out: 3500   Physical Exam General: No apparent distress   in bedside chair, texting on phone Lungs: Normal effort. Lungs clear to auscultation, no crackles or wheezes. Cardiovascular: Regular rate and rhythm, no edema Wounds:  Clean, dry, intact. No signs of infection.  Lab Results: BMET    Component Value Date/Time   NA 126 (L) 08/25/2018 0508   NA 138 09/20/2017 1054   K 5.3 (H) 08/25/2018 0508   CL 89 (L) 08/25/2018 0508   CO2 23 08/25/2018 0508   GLUCOSE 132 (H) 08/25/2018 0508   BUN 55 (H) 08/25/2018 0508   BUN 41 (H) 09/20/2017 1054   CREATININE 10.67 (H) 08/25/2018 0508   CALCIUM 8.7 (L) 08/25/2018 0508   GFRNONAA 5 (L) 08/25/2018 0508   GFRAA 6 (L) 08/25/2018 0508   CBC    Component Value Date/Time   WBC 6.3 08/25/2018 1238   RBC 3.71 (L) 08/25/2018 1238   HGB 10.7 (L) 08/25/2018 1238   HGB 14.2 09/20/2017 1054   HCT 33.1 (L) 08/25/2018 1238   HCT 42.3 09/20/2017 1054   PLT 219 08/25/2018 1238   PLT 162 09/20/2017 1054   MCV 89.2 08/25/2018 1238   MCV 95 09/20/2017 1054   MCH 28.8 08/25/2018 1238   MCHC 32.3 08/25/2018 1238   RDW 16.2 (H) 08/25/2018 1238   RDW 15.9 (H) 09/20/2017 1054   LYMPHSABS 1.8 08/18/2018 0435   LYMPHSABS 1.5 09/20/2017 1054   MONOABS 1.2 (H) 08/18/2018 0435   EOSABS 0.3 08/18/2018 0435   EOSABS 0.1 09/20/2017  1054   BASOSABS 0.1 08/18/2018 0435   BASOSABS 0.0 09/20/2017 1054   CBG's (last 3):   Recent Labs    08/25/18 2117 08/26/18 0617 08/26/18 1159  GLUCAP 132* 166* 189*   LFT's Lab Results  Component Value Date   ALT 8 08/18/2018   AST 14 (L) 08/18/2018   ALKPHOS 101 08/18/2018   BILITOT 0.6 08/18/2018    Studies/Results: No results found.  Medications:  I have reviewed the patient's current medications. Scheduled Medications: . brimonidine  1 drop Both Eyes BID  . calcium acetate  1,334 mg Oral TID WC  . Chlorhexidine Gluconate Cloth  6 each Topical Q0600  . darbepoetin (ARANESP) injection - DIALYSIS  150 mcg Intravenous Q Wed-HD  . docusate sodium  100 mg Oral BID  . fluticasone  1 spray Each Nare Daily  . heparin  5,000 Units Subcutaneous Q8H  . heparin  6,000 Units Dialysis Once in dialysis  . insulin aspart  0-7 Units Subcutaneous TID WC  . latanoprost  1 drop Both Eyes QHS  . metoCLOPramide  5 mg Oral BID AC  . metoprolol succinate  50 mg Oral QHS  . pantoprazole  40 mg Oral Daily  . sevelamer carbonate  800 mg Oral TID WC  . silver sulfADIAZINE   Topical  Daily  . sodium chloride flush  3 mL Intravenous Q12H  . timolol  1 drop Both Eyes BID   PRN Medications: sodium chloride, sodium chloride, acetaminophen, albuterol, alteplase, alum & mag hydroxide-simeth, bisacodyl, diphenhydrAMINE, guaiFENesin-dextromethorphan, heparin, lidocaine (PF), lidocaine-prilocaine, methocarbamol, ondansetron **OR** ondansetron (ZOFRAN) IV, oxyCODONE, pentafluoroprop-tetrafluoroeth, polyethylene glycol, prochlorperazine **OR** prochlorperazine **OR** prochlorperazine, sorbitol  Assessment/Plan: Active Problems:   Unilateral AKA, right (HCC)   SIRS (systemic inflammatory response syndrome) (HCC)   Poorly controlled type 2 diabetes mellitus with peripheral neuropathy (HCC)   Peripheral arterial disease (HCC)   Dry gangrene (HCC)   Postoperative pain   Labile blood glucose   ESRD  (end stage renal disease) (HCC)   Oxygen desaturation   1.  Functional debility from right AKA requiring multidisciplinary therapy with intensive inpatient rehab as ongoing.  Continue medical and supportive care as ordered. 2.  End-stage renal disease with hemodialysis MWF.  Appreciate renal comanagement. 3.  Type 2 diabetes with chronic nephropathy.  Uncontrolled with A1c 10.1 prior to admission.  Labile CBGs complicating tight management.  Continue treatment as ongoing with carb modified diet as tolerated. 4.  Glaucoma.  Continue multidrug therapy as ongoing 5.  Dry gangrene  Length of stay, days: 9  Valerie A. Asa Lente, MD 08/26/2018, 2:32 PM

## 2018-08-26 NOTE — Progress Notes (Addendum)
Speech Language Pathology Daily Session Note  Patient Details  Name: Howard Bell MRN: [id number] Date of Birth: Jul 07, 1965  Today's Date: 08/26/2018 SLP Individual Time: 0910-0950 SLP Individual Time Calculation (min): 40 min  Short Term Goals: Week 2: SLP Short Term Goal 1 (Week 2): STGs=LTGs  Skilled Therapeutic Interventions: Patient received skilled SLP services targeting cognitive goals. Patient responded to functional problem solving scenarios for the home with 100% accuracy and min verbal cues. Patient participated in math calculations task for adding and subtracting the cost of items at the grocery store with 75% accuracy following max verbal cues. At the end of therapy session patient was upright in wheelchair, chair alarm on, and all needs within reach.  Pain Pain Assessment Pain Scale: 0-10 Pain Score: 0-No pain  Therapy/Group: Individual Therapy  Cristy Folks 08/26/2018, 9:58 AM

## 2018-08-26 NOTE — Progress Notes (Signed)
Subjective:  Tolerated hd yest  ,no cos  Siting in [facility]   Objective Vital signs in last 24 hours: Vitals:   08/25/18 1800 08/25/18 1819 08/25/18 2023 08/26/18 0309  BP: (!) 142/65 (!) 151/48 (!) 139/51 (!) 149/75  Pulse: 63 86 85 88  Resp: 18 18 16 18   Temp:  98.1 F (36.7 C) 98.7 F (37.1 C) 98.3 F (36.8 C)  TempSrc:  Oral Oral Oral  SpO2: 99% 98% 97% 94%  Weight:  94.9 kg    Height:       Weight change:   Physical Exam General:alert NAD obese male  Heart:RRR; 2/6 murmur Lungs:CTAB Abdomen:soft, Nt, ND Extremities:R AKAwith sock - no edema/LLE tr edema; scattered wounds - large wound bandaged/not examined (see photos under "media")-  Dialysis Access:LUE AVF + bruit  OP Dialysis Orders: SW MWF 4h 110min 450/2x 95.5kg 2/2.25 bath P2 Hep 9000 L AVF -Parsabiv 10mg  TIW -Mircera 150 mcg IV q 2 weeks (last 5/4)  Problem/Plan: 1. 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 Sharol Given. - Now in REHAB  2. LLE gangrene (new): Concern that was infected; BCx drawn 5/26 negative. . Dr. Sharol Given consulted and has recommended L AKA in the future - no urgent need, not felt to be infected. 3. ESRD: MWF- next HD  today 4. Hypertension/volume: BP controlled, will need to lower EDW on discharge. Continue to serially lower volume- net UF 3.5  6/05  with post wt 94.5. Had CXR pre HD 6/03  due to SOB which SOB - no acute findings - lower edw  @ dc  5. Anemia: Hgb9.0 5/29> 8.3 6/3>10.7 yest. -.Continue Aranesp135mcg q Monday. tsat 22% 5/22/ferritin 1300s - likely inflammatory- had 2 doses ferrlicitIncreasenext [id number] if hgb remains down 6. Metabolic bone disease: Corr Ca slightly high and Phos 3.6 (6/03 ) - Reduced Ca acetate binder.to 2 ac 5/30 - Continue Renvela also as binder. ResumeParsabivafter d/c.  7. T2DM:Insulin per primary. Hypoglycemia overnight 5/25 - glucotrol stopped. 8. Twitchiness: resolved  with holding gabapentin. 9. AMS (5/26 - 5/27): Hypoglycemia v.  meds v. delirium v. L heel gangrene - resolved;  10.   Disp d/cfrom CIR planned for 6/10   Ernest Haber, PA-C [facility] Beeper [phone] 08/26/2018,9:40 AM  LOS: 9 days   Labs: Basic Metabolic Panel: Recent Labs  Lab 08/23/18 1243 08/25/18 0508  NA 127* 126*  K 5.5* 5.3*  CL 91* 89*  CO2 21* 23  GLUCOSE 81 132*  BUN 98* 55*  CREATININE 14.58* 10.67*  CALCIUM 9.4 8.7*  PHOS 3.6  --    Liver Function Tests: Recent Labs  Lab 08/23/18 1243  ALBUMIN 2.2*   No results for input(s): LIPASE, AMYLASE in the last 168 hours. No results for input(s): AMMONIA in the last 168 hours. CBC: Recent Labs  Lab 08/23/18 1243 08/25/18 1238  WBC 8.4 6.3  HGB 8.3* 10.7*  HCT 26.0* 33.1*  MCV 89.7 89.2  PLT 267 219   Cardiac Enzymes: No results for input(s): CKTOTAL, CKMB, CKMBINDEX, TROPONINI in the last 168 hours. CBG: Recent Labs  Lab 08/25/18 0645 08/25/18 1150 08/25/18 1905 08/25/18 2117 08/26/18 0617  GLUCAP 113* 164* 88 132* 166*    Studies/Results: No results found. Medications: . sodium chloride    . sodium chloride     . brimonidine  1 drop Both Eyes BID  . calcium acetate  1,334 mg Oral TID WC  . Chlorhexidine Gluconate Cloth  6 each Topical Q0600  . darbepoetin (  ARANESP) injection - DIALYSIS  150 mcg Intravenous Q Wed-HD  . docusate sodium  100 mg Oral BID  . fluticasone  1 spray Each Nare Daily  . heparin  5,000 Units Subcutaneous Q8H  . heparin  6,000 Units Dialysis Once in dialysis  . insulin aspart  0-7 Units Subcutaneous TID WC  . latanoprost  1 drop Both Eyes QHS  . metoCLOPramide  5 mg Oral BID AC  . metoprolol succinate  50 mg Oral QHS  . pantoprazole  40 mg Oral Daily  . sevelamer carbonate  800 mg Oral TID WC  . silver sulfADIAZINE   Topical Daily  . sodium chloride flush  3 mL Intravenous Q12H  . timolol  1 drop Both Eyes BID

## 2018-08-27 ENCOUNTER — Inpatient Hospital Stay (HOSPITAL_COMMUNITY): Payer: Medicare Other

## 2018-08-27 LAB — GLUCOSE, CAPILLARY
Glucose-Capillary: 125 mg/dL — ABNORMAL HIGH (ref 70–99)
Glucose-Capillary: 152 mg/dL — ABNORMAL HIGH (ref 70–99)
Glucose-Capillary: 147 mg/dL — ABNORMAL HIGH (ref 70–99)
Glucose-Capillary: 169 mg/dL — ABNORMAL HIGH (ref 70–99)

## 2018-08-27 NOTE — Progress Notes (Signed)
Occupational Therapy Session Note  Patient Details  Name: Howard Bell MRN: [id number] Date of Birth: 1965/06/12  Today's Date: 08/27/2018 OT Individual Time: 1400-1513 OT Individual Time Calculation (min): 73 min    Short Term Goals: Week 1:     Skilled Therapeutic Interventions/Progress Updates:    1:1. Pain 8/10 reported. RN provides pain medication. Pt completes lateral scoot transfer with slide board with MIN A to place SB and CGA for balance. Pt completes 3x10 modified sit ups and russian twists with instructional cues for technique. Attempted lateral leans in prep for clothing management, however pain in residual limb impacting practice in B directions. Pt commpeltes UB therex with 2# dumbells with demo cues for global strengthening of BUEs requires for BADLs and functional transfers. Exited session with pt squat pivot back to bed with MOD A and VC for hand placement. Exit alarm on and all needs in reach  Therapy Documentation Precautions:  Precautions Precautions: Fall Restrictions Weight Bearing Restrictions: Yes RLE Weight Bearing: Non weight bearing General:   Vital Signs:  Pain:   ADL:   Vision   Perception    Praxis   Exercises:   Other Treatments:     Therapy/Group: Individual Therapy  Tonny Branch 08/27/2018, 12:54 PM

## 2018-08-27 NOTE — Plan of Care (Signed)
  Problem: RH SKIN INTEGRITY Goal: RH STG SKIN FREE OF INFECTION/BREAKDOWN Description Patient will not develop new skin breakdown entire stay on rehab with min assist  Outcome: Progressing; silvadene cream to LLE ; dressing done to RLE healing well; clean , dry and intact   Problem: RH KNOWLEDGE DEFICIT LIMB LOSS Goal: RH STG INCREASE KNOWLEDGE OF SELF CARE AFTER LIMB LOSS Description Increase self care after limb loss  with mod assistance  Outcome: Progressing

## 2018-08-27 NOTE — Progress Notes (Signed)
Howard Bell is a 53 y.o. male 08-05-1965 [id number]  Subjective: Reports pain in L shoulder causing poor sleep - ?fluid in shoulder. Otherwise, no concerns or new problems  Objective: Vital signs in last 24 hours: Temp:  [98 F (36.7 C)-99.5 F (37.5 C)] 98.7 F (37.1 C) (06/07 0512) Pulse Rate:  [79-90] 79 (06/07 0513) Resp:  [16-18] 16 (06/07 0512) BP: (151-156)/(69-78) 151/78 (06/07 0512) SpO2:  [87 %-94 %] 92 % (06/07 0513) Weight change:  Last BM Date: 08/26/18  Intake/Output from previous day: 06/06 0701 - 06/07 0700 In: 360 [P.O.:360] Out: -   Physical Exam General: No apparent distress   in bedside WC Lungs: Normal effort. Lungs clear to auscultation, no crackles or wheezes. Cardiovascular: Regular rate and rhythm, no edema Wounds: Dressing on stump is Clean, dry, intact. No signs of infection.  Lab Results: BMET    Component Value Date/Time   NA 126 (L) 08/25/2018 0508   NA 138 09/20/2017 1054   K 5.3 (H) 08/25/2018 0508   CL 89 (L) 08/25/2018 0508   CO2 23 08/25/2018 0508   GLUCOSE 132 (H) 08/25/2018 0508   BUN 55 (H) 08/25/2018 0508   BUN 41 (H) 09/20/2017 1054   CREATININE 10.67 (H) 08/25/2018 0508   CALCIUM 8.7 (L) 08/25/2018 0508   GFRNONAA 5 (L) 08/25/2018 0508   GFRAA 6 (L) 08/25/2018 0508   CBC    Component Value Date/Time   WBC 6.3 08/25/2018 1238   RBC 3.71 (L) 08/25/2018 1238   HGB 10.7 (L) 08/25/2018 1238   HGB 14.2 09/20/2017 1054   HCT 33.1 (L) 08/25/2018 1238   HCT 42.3 09/20/2017 1054   PLT 219 08/25/2018 1238   PLT 162 09/20/2017 1054   MCV 89.2 08/25/2018 1238   MCV 95 09/20/2017 1054   MCH 28.8 08/25/2018 1238   MCHC 32.3 08/25/2018 1238   RDW 16.2 (H) 08/25/2018 1238   RDW 15.9 (H) 09/20/2017 1054   LYMPHSABS 1.8 08/18/2018 0435   LYMPHSABS 1.5 09/20/2017 1054   MONOABS 1.2 (H) 08/18/2018 0435   EOSABS 0.3 08/18/2018 0435   EOSABS 0.1 09/20/2017 1054   BASOSABS 0.1 08/18/2018 0435   BASOSABS 0.0 09/20/2017 1054    CBG's (last 3):   Recent Labs    08/26/18 1651 08/26/18 2132 08/27/18 0632  GLUCAP 179* 185* 125*   LFT's Lab Results  Component Value Date   ALT 8 08/18/2018   AST 14 (L) 08/18/2018   ALKPHOS 101 08/18/2018   BILITOT 0.6 08/18/2018    Studies/Results: No results found.  Medications:  I have reviewed the patient's current medications. Scheduled Medications: . brimonidine  1 drop Both Eyes BID  . calcium acetate  1,334 mg Oral TID WC  . Chlorhexidine Gluconate Cloth  6 each Topical Q0600  . darbepoetin (ARANESP) injection - DIALYSIS  150 mcg Intravenous Q Wed-HD  . docusate sodium  100 mg Oral BID  . fluticasone  1 spray Each Nare Daily  . heparin  5,000 Units Subcutaneous Q8H  . heparin  6,000 Units Dialysis Once in dialysis  . insulin aspart  0-7 Units Subcutaneous TID WC  . latanoprost  1 drop Both Eyes QHS  . metoCLOPramide  5 mg Oral BID AC  . metoprolol succinate  50 mg Oral QHS  . pantoprazole  40 mg Oral Daily  . sevelamer carbonate  800 mg Oral TID WC  . silver sulfADIAZINE   Topical Daily  . sodium chloride flush  3 mL Intravenous Q12H  .  timolol  1 drop Both Eyes BID   PRN Medications: sodium chloride, sodium chloride, acetaminophen, albuterol, alteplase, alum & mag hydroxide-simeth, bisacodyl, diphenhydrAMINE, guaiFENesin-dextromethorphan, heparin, lidocaine (PF), lidocaine-prilocaine, methocarbamol, ondansetron **OR** ondansetron (ZOFRAN) IV, oxyCODONE, pentafluoroprop-tetrafluoroeth, polyethylene glycol, prochlorperazine **OR** prochlorperazine **OR** prochlorperazine, sorbitol  Assessment/Plan: Active Problems:   Unilateral AKA, right (HCC)   SIRS (systemic inflammatory response syndrome) (HCC)   Poorly controlled type 2 diabetes mellitus with peripheral neuropathy (HCC)   Peripheral arterial disease (HCC)   Dry gangrene (HCC)   Postoperative pain   Labile blood glucose   ESRD (end stage renal disease) (HCC)   Oxygen desaturation   1.   Functional debility from right AKA requiring multidisciplinary therapy with intensive inpatient rehab as ongoing.  Continue medical and supportive care as ordered. 2.  End-stage renal disease with hemodialysis MWF.  Appreciate renal comanagement. 3.  Type 2 diabetes with chronic nephropathy.  Uncontrolled with A1c 10.1 prior to admission.  Labile CBGs complicating tight management.  Continue treatment as ongoing with carb modified diet as tolerated. 4.  Glaucoma.  Continue multidrug therapy as ongoing 5.  Dry gangrene LLE - monitor as ongoing for anticipated intervention by ortho in future  Length of stay, days: 10  Carisa Backhaus A. Asa Lente, MD 08/27/2018, 9:33 AM

## 2018-08-27 NOTE — Progress Notes (Signed)
Physical Therapy Session Note  Patient Details  Name: [name] MRN: [id number] Date of Birth: 1965-07-31  Today's Date: 08/27/2018 PT Individual Time: 1102-1159 PT Individual Time Calculation (min): 57 min   Short Term Goals: Week 1:  PT Short Term Goal 1 (Week 1): Patient to demonstrate dynamic sitting balance with S.  PT Short Term Goal 2 (Week 1): Patient to perform side board transfer with S including set up. PT Short Term Goal 3 (Week 1): Patient to propel w/c mod I on level indoor surfaces. PT Short Term Goal 4 (Week 1): Patient to perform sit to stand with CGA in prep for car transfer to SUV  Skilled Therapeutic Interventions/Progress Updates:    Pt seated in w/c upon PT arrival, agreeable to therapy tx and reports pain 7/10 in R LE. Pt on 2L O2/min throughout session. Pt seated in w/c donned pants, with assist to loop L LE through, pt performed sit<>stand with RW and min assist while therapist assisted to pull pants over hips. Pt reports he thought he had a shoe in the room however this therapist could not locate it. Therapist donned sock on L LE. Pt propelled w/c throughout unit x 50 ft using B UEs and with supervision. Pt performed squat pivot transfer from w/c<>mat this session with min assist. Pt transferred to supine with supervision and performed LE therex this session for strengthening, 2 x 10 each: supine straight leg raises, sidelying hip abduction, and sidelying hip extension, cues for techniques. Pt performed UE strengthening exercises, 2 x 10 each: supine chest press 7#, shoulder flexion 3#, and bicep curls 7#. Transferred back to sitting with supervision and transferred to w/c min assist. Pt propelled w/c back to room x 100 ft with supervision. Pt performed x 2 sit<>stands with RW and min assist in order to adjust R sock and pants. Pt left with needs in reach and chair alarm set.   Therapy Documentation Precautions:  Precautions Precautions: Fall Restrictions Weight  Bearing Restrictions: Yes RLE Weight Bearing: Non weight bearing   Therapy/Group: Individual Therapy  Netta Corrigan, PT, DPT 08/27/2018, 10:17 AM

## 2018-08-27 NOTE — Progress Notes (Signed)
Patient ID: Howard Bell, male   DOB: [birth year], 53 y.o.   MRN: [id number] [facility] Progress Note   Assessment/ Plan:   1. Gangrene R BKA stump: Status post right above-knee amputation on 5/15 per Dr. Sharol Given currently ongoing inpatient rehabilitation.  Also noted to have left LE gangrene with anticipated L AKA in the future as recommended by Dr. Sharol Given per his evaluation-not infected and the need is not felt to be urgent at this point. 2. ESRD: Hemodialysis on a Monday/Wednesday/Friday schedule with his next dialysis tomorrow 3. Hypertension/volume: Blood pressures elevated with evidence of volume excess, continue efforts at ultrafiltration/lowering dry weight with hemodialysis. 4. Anemia: Hemoglobin levels trending upwards, low iron saturation but elevated ferritin in the setting of recent surgery/gangrene prohibitive to intravenous iron bolus. 5. Metabolic bone disease: Slightly elevated corrected calcium level prompting decrease of calcium acetate dose, phosphorus level at goal.  Resume Parsabiv upon discharge.  6. T2DM:Insulin per primary. Hypoglycemia overnight 5/25 - glucotrol stopped. 7. Disposition: Plans in place for discharge on 08/30/2018 from CIR  Subjective:   He reports that he slept poorly overnight because he could not find a comfortable position   Objective:   BP (!) 151/78 (BP Location: Right Arm)   Pulse 79   Temp 98.7 F (37.1 C) (Oral)   Resp 16   Ht 6' (1.829 m)   Wt 94.9 kg   SpO2 92%   BMI 28.37 kg/m   Physical Exam: Gen: Sitting up in wheelchair, eating breakfast CVS: Pulse regular rhythm, normal rate, S1 and S2 normal Resp: Fine rales right base otherwise clear to auscultation, no wheeze Abd: Soft, obese, nontender Ext: Left brachiocephalic fistula pulsatile with intact/clean dressings.  Status post right above-knee amputation with trace left leg edema and dressing over wound.  Labs: BMET Recent Labs  Lab 08/23/18 1243 08/25/18 0508   NA 127* 126*  K 5.5* 5.3*  CL 91* 89*  CO2 21* 23  GLUCOSE 81 132*  BUN 98* 55*  CREATININE 14.58* 10.67*  CALCIUM 9.4 8.7*  PHOS 3.6  --    CBC Recent Labs  Lab 08/23/18 1243 08/25/18 1238  WBC 8.4 6.3  HGB 8.3* 10.7*  HCT 26.0* 33.1*  MCV 89.7 89.2  PLT 267 219   Medications:    . brimonidine  1 drop Both Eyes BID  . calcium acetate  1,334 mg Oral TID WC  . Chlorhexidine Gluconate Cloth  6 each Topical Q0600  . darbepoetin (ARANESP) injection - DIALYSIS  150 mcg Intravenous Q Wed-HD  . docusate sodium  100 mg Oral BID  . fluticasone  1 spray Each Nare Daily  . heparin  5,000 Units Subcutaneous Q8H  . heparin  6,000 Units Dialysis Once in dialysis  . insulin aspart  0-7 Units Subcutaneous TID WC  . latanoprost  1 drop Both Eyes QHS  . metoCLOPramide  5 mg Oral BID AC  . metoprolol succinate  50 mg Oral QHS  . pantoprazole  40 mg Oral Daily  . sevelamer carbonate  800 mg Oral TID WC  . silver sulfADIAZINE   Topical Daily  . sodium chloride flush  3 mL Intravenous Q12H  . timolol  1 drop Both Eyes BID   Elmarie Shiley, MD 08/27/2018, 8:49 AM

## 2018-08-28 ENCOUNTER — Inpatient Hospital Stay (HOSPITAL_COMMUNITY): Payer: Medicare Other | Admitting: Occupational Therapy

## 2018-08-28 ENCOUNTER — Inpatient Hospital Stay (HOSPITAL_COMMUNITY): Payer: Medicare Other | Admitting: Physical Therapy

## 2018-08-28 ENCOUNTER — Inpatient Hospital Stay (HOSPITAL_COMMUNITY): Payer: Medicare Other | Admitting: Speech Pathology

## 2018-08-28 LAB — CBC
HCT: 25.7 % — ABNORMAL LOW (ref 39.0–52.0)
Hemoglobin: 8.2 g/dL — ABNORMAL LOW (ref 13.0–17.0)
MCH: 29.4 pg (ref 26.0–34.0)
MCHC: 31.9 g/dL (ref 30.0–36.0)
MCV: 92.1 fL (ref 80.0–100.0)
Platelets: 251 10*3/uL (ref 150–400)
RBC: 2.79 MIL/uL — ABNORMAL LOW (ref 4.22–5.81)
RDW: 17.2 % — ABNORMAL HIGH (ref 11.5–15.5)
WBC: 9.5 10*3/uL (ref 4.0–10.5)
nRBC: 0 % (ref 0.0–0.2)

## 2018-08-28 LAB — RENAL FUNCTION PANEL
Albumin: 2.3 g/dL — ABNORMAL LOW (ref 3.5–5.0)
Anion gap: 14 (ref 5–15)
BUN: 68 mg/dL — ABNORMAL HIGH (ref 6–20)
CO2: 24 mmol/L (ref 22–32)
Calcium: 9.5 mg/dL (ref 8.9–10.3)
Chloride: 91 mmol/L — ABNORMAL LOW (ref 98–111)
Creatinine, Ser: 11.59 mg/dL — ABNORMAL HIGH (ref 0.61–1.24)
GFR calc Af Amer: 5 mL/min — ABNORMAL LOW (ref >60)
GFR calc non Af Amer: 4 mL/min — ABNORMAL LOW (ref >60)
Glucose, Bld: 119 mg/dL — ABNORMAL HIGH (ref 70–99)
Phosphorus: 3.1 mg/dL (ref 2.5–4.6)
Potassium: 5.2 mmol/L — ABNORMAL HIGH (ref 3.5–5.1)
Sodium: 129 mmol/L — ABNORMAL LOW (ref 135–145)

## 2018-08-28 LAB — GLUCOSE, CAPILLARY
Glucose-Capillary: 139 mg/dL — ABNORMAL HIGH (ref 70–99)
Glucose-Capillary: 144 mg/dL — ABNORMAL HIGH (ref 70–99)
Glucose-Capillary: 83 mg/dL (ref 70–99)
Glucose-Capillary: 188 mg/dL — ABNORMAL HIGH (ref 70–99)

## 2018-08-28 MED ORDER — HEPARIN SODIUM (PORCINE) 1000 UNIT/ML DIALYSIS: 40.0000 [IU]/kg | INTRAMUSCULAR | Status: DC | PRN

## 2018-08-28 NOTE — Progress Notes (Cosign Needed)
[facility] Progress Note   Dialysis Orders: SW MWF 4h 28min 450/2x 95.5kg 2/2.25 bath P2 Hep 9000 L AVF -Parsabiv 10mg  TIW -Mircera 150 mcg IV q 2 weeks (last 5/4)  Problem/Plan: 1. 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 Sharol Given. -Now in REHAB 2. LLE gangrene (new): Concern that was infected; BCx drawn 5/26 negative. . Dr. Sharol Given consulted and has recommended L AKA in the future - no urgent need, not felt to be infected. 3. ESRD: MWF- next HDtoday - check labs pre HD 4. Hypertension/volume: BP controlled, will need to lower EDW on discharge. Continue to serially lower volume- net UF 3.5  6/05  with post wt 94.5. Had CXR pre HD6/03due to SOB which SOB - no acute findings - lower edw  @ dc -volume seems up by exam today  - sig LE edema on left and rales on exam 5. Anemia: Hgb9.0 5/29> 8.3 6/3>10.7  -.Continue Aranesp162mcg q Monday. tsat 22% 5/22/ferritin 1300s - likely inflammatory- had 2 doses ferrlicit 6. Metabolic bone disease: Ca/P ok Reduced Ca acetate binder.to 2 ac 5/30 - Continue Renvela also as binder. ResumeParsabivafter d/c.  7. T2DM:Insulin per primary. Hypoglycemia overnight 5/25 - glucotrol stopped. 8. Twitchiness: resolved  with holding gabapentin. 9. AMS (5/26 - 5/27): Hypoglycemia v. meds v. delirium v. L heel gangrene - resolved;  10.   Disp d/cfrom CIR planned for 6/10   Burgess [facility] Beeper [phone] 08/28/2018,11:09 AM  LOS: 11 days   Subjective:   No c/o slept better last night  Objective Vitals:   08/27/18 1305 08/27/18 2011 08/28/18 0257 08/28/18 0450  BP: (!) 161/84 (!) 144/70 (!) 157/62 (!) 157/77  Pulse: 78 87 79 81  Resp: 18 16  18   Temp:  99.1 F (37.3 C)  98.7 F (37.1 C)  TempSrc:  Oral  Oral  SpO2: 97% 95%  (!) 54%  Weight:      Height:       Physical Exam General: NAD sitting in WC sleeping Heart: RRR Lungs: crackles bases Abdomen: obese slight  destended NT Extremities: right AKA  And LLE 2 + LE edema Dialysis Access:  Left upper AVF + bruit   Additional Objective Labs: Basic Metabolic Panel: Recent Labs  Lab 08/23/18 1243 08/25/18 0508  NA 127* 126*  K 5.5* 5.3*  CL 91* 89*  CO2 21* 23  GLUCOSE 81 132*  BUN 98* 55*  CREATININE 14.58* 10.67*  CALCIUM 9.4 8.7*  PHOS 3.6  --    Liver Function Tests: Recent Labs  Lab 08/23/18 1243  ALBUMIN 2.2*   No results for input(s): LIPASE, AMYLASE in the last 168 hours. CBC: Recent Labs  Lab 08/23/18 1243 08/25/18 1238  WBC 8.4 6.3  HGB 8.3* 10.7*  HCT 26.0* 33.1*  MCV 89.7 89.2  PLT 267 219   Blood Culture    Component Value Date/Time   SDES BLOOD LEFT ANTECUBITAL 08/15/2018 1526   SPECREQUEST  08/15/2018 1526    BOTTLES DRAWN AEROBIC ONLY Blood Culture adequate volume   CULT  08/15/2018 1526    NO GROWTH 5 DAYS Performed at [facility], [place] [address], [place], [place] [address]    REPTSTATUS 08/20/2018 FINAL 08/15/2018 1526    Cardiac Enzymes: No results for input(s): CKTOTAL, CKMB, CKMBINDEX, TROPONINI in the last 168 hours. CBG: Recent Labs  Lab 08/27/18 0632 08/27/18 1212 08/27/18 1716 08/27/18 2127 08/28/18 0613  GLUCAP 125* 152* 147* 169* 139*  Iron Studies: No results for input(s): IRON, TIBC, TRANSFERRIN, FERRITIN in the last 72 hours. Lab Results  Component Value Date   INR 1.2 08/15/2018   Studies/Results: No results found. Medications: . sodium chloride    . sodium chloride     . brimonidine  1 drop Both Eyes BID  . calcium acetate  1,334 mg Oral TID WC  . Chlorhexidine Gluconate Cloth  6 each Topical Q0600  . darbepoetin (ARANESP) injection - DIALYSIS  150 mcg Intravenous Q Wed-HD  . docusate sodium  100 mg Oral BID  . fluticasone  1 spray Each Nare Daily  . heparin  5,000 Units Subcutaneous Q8H  . heparin  6,000 Units Dialysis Once in dialysis  . insulin aspart  0-7 Units Subcutaneous TID WC  . latanoprost  1 drop  Both Eyes QHS  . metoCLOPramide  5 mg Oral BID AC  . metoprolol succinate  50 mg Oral QHS  . pantoprazole  40 mg Oral Daily  . sevelamer carbonate  800 mg Oral TID WC  . silver sulfADIAZINE   Topical Daily  . sodium chloride flush  3 mL Intravenous Q12H  . timolol  1 drop Both Eyes BID

## 2018-08-28 NOTE — Progress Notes (Signed)
Speech Language Pathology Daily Session Note  Patient Details  Name: Howard Bell MRN: [id number] Date of Birth: 30-Sep-1965  Today's Date: 08/28/2018 SLP Individual Time: 0830-0930 SLP Individual Time Calculation (min): 60 min  Short Term Goals: Week 2: SLP Short Term Goal 1 (Week 2): STGs=LTGs  Skilled Therapeutic Interventions: Skilled treatment session focused on cognitive goals. Upon arrival, patient was lying in supine but appeared to have been sitting EOB based on his angle. Patient reported he was in that position trying to breath. SLP noticed that patient's nasal cannula had been removed and placed on the wall with the supplemental oxygen turned off. O2 taken: 54%. Nasal cannula was placed on patient with 2L of O2. Patient's O2 saturations slowly came up to the mid 80's. Eventually patient required 2.5L of O2 to reach the low 90's. RN aware of situation. Patient unable to perform stand pivot to wheelchair due to feeling weak but was able to transfer via a squat pivot. Patient was disoriented to time and required Mod verbal cues for use of visual aids. Patient was oriented to place but reported the [place], [place]" with Mod verbal cues needed to self-correct. Patient lethargic with intermittent language of confusion throughout session that required Mod verbal cues for alertness and attention. Patient left upright in wheelchair with nasal cannula in place, alarm on and all needs within reach. Continue with current plan of care.      Pain Pain Assessment Pain Score: 0-No pain  Therapy/Group: Individual Therapy  Eevie Lapp 08/28/2018, 12:29 PM

## 2018-08-28 NOTE — Progress Notes (Signed)
[facility] Progress Note   Dialysis Orders: SW MWF 4h 66min 450/2x 95.5kg 2/2.25 bath P2 Hep 9000 L AVF -Parsabiv 10mg  TIW -Mircera 150 mcg IV q 2 weeks (last 5/4)  Problem/Plan: 1. 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 Sharol Given. -Now in REHAB 2. LLE gangrene (new): Concern that was infected; BCx drawn 5/26 negative. . Dr. Sharol Given consulted and has recommended L AKA in the future - no urgent need, not felt to be infected. 3. ESRD: MWF- next HDtoday - check labs pre HD 4. Hypertension/volume: BP controlled, will need to lower EDW on discharge. Continue to serially lower volume- net UF 3.5  6/05  with post wt 94.5. Had CXR pre HD6/03due to SOB which SOB - no acute findings - lower edw  @ dc -volume seems up by exam today  - sig LE edema on left and rales on exam 5. Anemia: Hgb9.0 5/29> 8.3 6/3>10.7  -.Continue Aranesp118mcg q Monday. tsat 22% 5/22/ferritin 1300s - likely inflammatory- had 2 doses ferrlicit 6. Metabolic bone disease: Ca/P ok Reduced Ca acetate binder.to 2 ac 5/30 - Continue Renvela also as binder. ResumeParsabivafter d/c.  7. T2DM:Insulin per primary. Hypoglycemia overnight 5/25 - glucotrol stopped. 8. Twitchiness: resolved  with holding gabapentin. 9. AMS (5/26 - 5/27): Hypoglycemia v. meds v. delirium v. L heel gangrene - resolved;  10.   Disp d/cfrom CIR planned for 6/10   [facility] Beeper [phone] 08/28/2018,3:54 PM  LOS: 11 days   Pt seen, examined and agree w A/P as above.  Kelly Splinter  MD 08/28/2018, 3:54 PM    Subjective:   No c/o slept better last night  Objective Vitals:   08/28/18 1347 08/28/18 1356 08/28/18 1400 08/28/18 1430  BP: (!) 204/85 (!) 178/92 (!) 198/83 (!) 182/96  Pulse: 78 77 77 82  Resp: 18     Temp: 98.5 F (36.9 C)     TempSrc: Oral     SpO2: 94%     Weight: 98 kg     Height:       Physical Exam General: NAD sitting in WC  sleeping Heart: RRR Lungs: crackles bases Abdomen: obese slight destended NT Extremities: right AKA  And LLE 2 + LE edema Dialysis Access:  Left upper AVF + bruit   Additional Objective Labs: Basic Metabolic Panel: Recent Labs  Lab 08/23/18 1243 08/25/18 0508 08/28/18 1420  NA 127* 126* 129*  K 5.5* 5.3* 5.2*  CL 91* 89* 91*  CO2 21* 23 24  GLUCOSE 81 132* 119*  BUN 98* 55* 68*  CREATININE 14.58* 10.67* 11.59*  CALCIUM 9.4 8.7* 9.5  PHOS 3.6  --  3.1   Liver Function Tests: Recent Labs  Lab 08/23/18 1243 08/28/18 1420  ALBUMIN 2.2* 2.3*   No results for input(s): LIPASE, AMYLASE in the last 168 hours. CBC: Recent Labs  Lab 08/23/18 1243 08/25/18 1238 08/28/18 1420  WBC 8.4 6.3 9.5  HGB 8.3* 10.7* 8.2*  HCT 26.0* 33.1* 25.7*  MCV 89.7 89.2 92.1  PLT 267 219 251   Blood Culture    Component Value Date/Time   SDES BLOOD LEFT ANTECUBITAL 08/15/2018 1526   SPECREQUEST  08/15/2018 1526    BOTTLES DRAWN AEROBIC ONLY Blood Culture adequate volume   CULT  08/15/2018 1526    NO GROWTH 5 DAYS Performed at [facility], [place] [address], [place], [place] [address]    REPTSTATUS 08/20/2018 FINAL 08/15/2018 1526  Cardiac Enzymes: No results for input(s): CKTOTAL, CKMB, CKMBINDEX, TROPONINI in the last 168 hours. CBG: Recent Labs  Lab 08/27/18 1212 08/27/18 1716 08/27/18 2127 08/28/18 0613 08/28/18 1151  GLUCAP 152* 147* 169* 139* 144*   Iron Studies: No results for input(s): IRON, TIBC, TRANSFERRIN, FERRITIN in the last 72 hours. Lab Results  Component Value Date   INR 1.2 08/15/2018   Studies/Results: No results found. Medications: . sodium chloride    . sodium chloride     . brimonidine  1 drop Both Eyes BID  . calcium acetate  1,334 mg Oral TID WC  . Chlorhexidine Gluconate Cloth  6 each Topical Q0600  . darbepoetin (ARANESP) injection - DIALYSIS  150 mcg Intravenous Q Wed-HD  . docusate sodium  100 mg Oral BID  . fluticasone  1 spray  Each Nare Daily  . heparin  5,000 Units Subcutaneous Q8H  . heparin  6,000 Units Dialysis Once in dialysis  . insulin aspart  0-7 Units Subcutaneous TID WC  . latanoprost  1 drop Both Eyes QHS  . metoCLOPramide  5 mg Oral BID AC  . metoprolol succinate  50 mg Oral QHS  . pantoprazole  40 mg Oral Daily  . sevelamer carbonate  800 mg Oral TID WC  . silver sulfADIAZINE   Topical Daily  . sodium chloride flush  3 mL Intravenous Q12H  . timolol  1 drop Both Eyes BID

## 2018-08-28 NOTE — Progress Notes (Signed)
[facility] PHYSICAL MEDICINE & REHABILITATION PROGRESS NOTE  Subjective/Complaints: Patient seen sitting up at the edge of his bed this morning.  Good sitting balance noted.  He states he did not sleep well because he was restless.  He states he wants to go home.  ROS: Denies CP, shortness of breath, nausea, vomiting, diarrhea.  Objective: Vital Signs: Blood pressure (!) 157/77, pulse 81, temperature 98.7 F (37.1 C), temperature source Oral, resp. rate 18, height 6' (1.829 m), weight 94.9 kg, SpO2 (!) 54 %. No results found. Recent Labs    08/25/18 1238  WBC 6.3  HGB 10.7*  HCT 33.1*  PLT 219   No results for input(s): NA, K, CL, CO2, GLUCOSE, BUN, CREATININE, CALCIUM in the last 72 hours.  Physical Exam: BP (!) 157/77 (BP Location: Right Wrist)   Pulse 81   Temp 98.7 F (37.1 C) (Oral)   Resp 18   Ht 6' (1.829 m)   Wt 94.9 kg   SpO2 (!) 54%   BMI 28.37 kg/m  Constitutional: No distress . Vital signs reviewed. HENT: Normocephalic.  Atraumatic. Eyes: EOMI.  No discharge. Cardiovascular: No JVD. Respiratory: Normal effort.  GI: Non-distended. Musc: Right AKA with edema and tenderness, unchanged Neurological: Alert Motor: Bilateral upper extremities: 5/5 proximal distal, except for left digit contractures Right lower extremity: Hip flexion 4+/5, unchanged Left lower extremity: 4/5 proximal to distal, unchanged Skin:Left lower extremity with necrotic changes, stable.  Please see media tab.  RLE: with dressing C/D/I.  Psychiatric:Flat  Assessment/Plan: 1. 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  Physiatrist is providing close team supervision and 24 hour management of active medical problems listed below.  Physiatrist and rehab team continue to assess barriers to discharge/monitor patient progress toward functional and  medical goals  Care Tool:  Bathing  Bathing activity did not occur: Refused Body parts bathed by patient: Right arm, Left arm, Chest, Abdomen, Face, Front perineal area, Right upper leg, Left upper leg, Left lower leg   Body parts bathed by helper: Buttocks Body parts n/a: Right lower leg   Bathing assist Assist Level: Moderate Assistance - Patient 50 - 74%     Upper Body Dressing/Undressing Upper body dressing   What is the patient wearing?: Pull over shirt    Upper body assist Assist Level: Supervision/Verbal cueing    Lower Body Dressing/Undressing Lower body dressing    Lower body dressing activity did not occur: Refused What is the patient wearing?: Pants, Underwear/pull up     Lower body assist Assist for lower body dressing: Maximal Assistance - Patient 25 - 49%     Toileting Toileting Toileting Activity did not occur (Clothing management and hygiene only): (pt did not need to toilet this session)  Toileting assist Assist for toileting: Maximal Assistance - Patient 25 - 49%     Transfers Chair/bed transfer  Transfers assist     Chair/bed transfer assist level: Minimal Assistance - Patient > 75% Chair/bed transfer assistive device: Armrests   Locomotion Ambulation   Ambulation assist   Ambulation activity did not occur: Safety/medical concerns          Walk 10 feet activity   Assist  Walk 10 feet activity did not occur: Safety/medical concerns        Walk 50 feet activity   Assist Walk 50 feet with 2 turns activity did not occur:  Safety/medical concerns         Walk 150 feet activity   Assist Walk 150 feet activity did not occur: Safety/medical concerns         Walk 10 feet on uneven surface  activity   Assist Walk 10 feet on uneven surfaces activity did not occur: Safety/medical concerns         Wheelchair     Assist Will patient use wheelchair at discharge?: Yes Type of Wheelchair: Manual    Wheelchair  assist level: Supervision/Verbal cueing Max wheelchair distance: 50 ft    Wheelchair 50 feet with 2 turns activity    Assist        Assist Level: Supervision/Verbal cueing   Wheelchair 150 feet activity     Assist     Assist Level: Supervision/Verbal cueing      Medical Problem List and Plan: 1.Functional and mobility deficitssecondary to right BKA/PADwith revision to right AKA, complicated by SIRS             Continue CIR  Stump shrinker ordered  Weekend notes reviewed-stable 2. Antithrombotics: -DVT/anticoagulation:Pharmaceutical:Heparin -antiplatelet therapy: N/A 3. Pain Management:             Continue gabapentin              Continue oxycodone prn.   Robaxin twice daily as needed started on 5/30  Relatively controlled on 6/8 4. Mood:LCSW to follow for evaluation and support. -antipsychotic agents: N/A 5. Neuropsych: This patientis not fullycapable of making decisions on hisown behalf.  DCed gabapentin and Ultram 6. Skin/Wound Care:             Discussed with Ortho and PA previously- Silvadene dressing.  Severe PVD with limited blood flow per vascular.  Discussed with nursing  Monitor right lower extremity  Monitor left lower extremity dry gangrene 7. Fluids/Electrolytes/Nutrition:Strict I/O.  8. T2DM: Hemoglobin A1c 10.1 on 5/18.  Continue to monitor BS ac/hs and use SSI for elevated BS.   Slightly labile on 6/8  Will avoid aggressive measures as much as possible due to hypersensitivity and hypoglycemia with medications             Monitor with increased mobility 9. ESRD: HD MWF. Schedule atend of the day to allow participation with therapies during the day and to assistwith activity tolerance.              Recs per nephro 10. Diabetic gastroparesis: On Reglan tid ac.  Will consider decreasing if necessary 11. Anemia of chronic disease: Continue Aranesp weekly for supplement.              Hemoglobin 10.7 on  6/5             Continue to monitor 12. Glaucoma: Managed with Timoptic, Alphagan and Xalatan. 13.  Labile blood pressure  Labile, but generally elevated on 6/8  Monitor with increased mobility 14.  Constipation immobility with opioids  Continue bowel meds 15.  SIRS: Resolved  D/ced IV Vanc, cefepime, Flagyl.   Blood cultures no growth. 16.  Supplemental oxygen dependent  Chest x-ray reviewed- unremarkable for acute infectious process.  Wean as tolerated, will require discharge  LOS: 11 days A FACE TO FACE EVALUATION WAS PERFORMED  Renella Steig Lorie Phenix 08/28/2018, 10:20 AM

## 2018-08-28 NOTE — Progress Notes (Signed)
Physical Therapy Session Note  Patient Details  Name: Howard Bell MRN: [id number] Date of Birth: October 19, 1965  Today's Date: 08/28/2018 PT Individual Time: 7915-0569 PT Individual Time Calculation (min): 54 min   Short Term Goals: Week 1:  PT Short Term Goal 1 (Week 1): Patient to demonstrate dynamic sitting balance with S.  PT Short Term Goal 2 (Week 1): Patient to perform side board transfer with S including set up. PT Short Term Goal 3 (Week 1): Patient to propel w/c mod I on level indoor surfaces. PT Short Term Goal 4 (Week 1): Patient to perform sit to stand with CGA in prep for car transfer to SUV  Skilled Therapeutic Interventions/Progress Updates:    pt in w/c and agreeable to therapy.  W/c propulsion 50' x 2, 100' with spO2 initially 85% on 3LO2, improves to 91% with cues for deep breathing. Pt performs squat pivot transfer to mat with CGA.  LE therex for Rt LE with SLR and hip abd/add, bialt LE marching and add squeeze with spO2 remaining 91%.  UE therex with 4# dowel all 2 x 10 chest press, shoulder flex, bicep curl, circles CW/CCW, horiz abd/add.  Pt performs sit <> stand and standing tolerance 3 x 45 seconds with RW, cues for posture and breathing.  Pt with increasing alertness as spO2 remains above 90% for most of session. Pt left in w/c with alarm set, needs at hand.  Therapy Documentation Precautions:  Precautions Precautions: Fall Restrictions Weight Bearing Restrictions: Yes RLE Weight Bearing: Non weight bearing Vital Signs: spO2 91% on 3LO2 Pain:  no c/o pain   Therapy/Group: Individual Therapy  Jaycie Kregel 08/28/2018, 10:24 AM

## 2018-08-28 NOTE — Progress Notes (Signed)
Occupational Therapy Session Note  Patient Details  Name: Howard Bell MRN: [id number] Date of Birth: Oct 06, 1965  Today's Date: 08/28/2018 OT Individual Time: 1100-1154 OT Individual Time Calculation (min): 54 min    Short Term Goals: Week 1:    Week 2:  OT Short Term Goal 1 (Week 2): Patient will complete toileting with Min assist  OT Short Term Goal 1 - Progress (Week 2): Not progressing OT Short Term Goal 2 (Week 2): Patient will complete toilet transfers with Min assist and appropriate DME as needed OT Short Term Goal 2 - Progress (Week 2): Met Week 3:  OT Short Term Goal 1 (Week 3): STGs = LTGs.  LTGs revised 08/25/18.      Skilled Therapeutic Interventions/Progress Updates:    pt seen this session to focus on [facility] transfers and toileting skills.  He stated that he washed up early this morning and put clean clothes on from bed level.  Pt described rolling back and forth in bed to pull pants up.   Pt initially declined toilet transfer training but with encouragement agreed to it.  He can now use a slide board to [facility] with close S and A to set up board under thigh.  He worked on lateral leans to pull pants down completely with min A to get over thighs and pulled pants over hips with mod A. Pt can lean more now with less R limb pain but has difficulty leaning and pulling pants up at the same time.  He is able to reach back with his R hand to self cleanse after a bowel movement.   Pt was very tired today stating he did not sleep well last night.  He was able to do 2 sets of w/c push ups and overhead arm reaches, but he would close his eyes and drop his head between sets.  Pt set up with tray as it was time for lunch and belt alarm on.    Therapy Documentation Precautions:  Precautions Precautions: Fall Restrictions Weight Bearing Restrictions: Yes RLE Weight Bearing: Non weight bearing    Vital Signs: Therapy Vitals Temp: 98.7 F (37.1 C) Temp Source: Oral Pulse Rate: 81 Resp:  18 BP: (!) 157/77 Patient Position (if appropriate): Sitting Oxygen Therapy SpO2: (!) 54 % O2 Device: Room Air Pain:     Therapy/Group: Individual Therapy  Dalbert Stillings 08/28/2018, 8:28 AM

## 2018-08-29 ENCOUNTER — Ambulatory Visit (HOSPITAL_COMMUNITY): Payer: Medicare Other

## 2018-08-29 ENCOUNTER — Inpatient Hospital Stay (HOSPITAL_COMMUNITY): Payer: Medicare Other | Admitting: Physical Therapy

## 2018-08-29 ENCOUNTER — Encounter (HOSPITAL_COMMUNITY): Payer: Medicare Other | Admitting: Speech Pathology

## 2018-08-29 ENCOUNTER — Encounter (HOSPITAL_COMMUNITY): Payer: Medicare Other | Admitting: Occupational Therapy

## 2018-08-29 LAB — GLUCOSE, CAPILLARY
Glucose-Capillary: 118 mg/dL — ABNORMAL HIGH (ref 70–99)
Glucose-Capillary: 197 mg/dL — ABNORMAL HIGH (ref 70–99)
Glucose-Capillary: 162 mg/dL — ABNORMAL HIGH (ref 70–99)
Glucose-Capillary: 156 mg/dL — ABNORMAL HIGH (ref 70–99)

## 2018-08-29 MED ORDER — CHLORHEXIDINE GLUCONATE CLOTH 2 % EX PADS
6.0000 | MEDICATED_PAD | Freq: Every day | CUTANEOUS | Status: DC
  Administered 2018-08-30: 6 via TOPICAL

## 2018-08-29 NOTE — Progress Notes (Signed)
Pt's O2 saturation is 88% on 2 L of oxygen [place]. Pt is currently sating at 90% on 3.5 L of O2 [place]. Pts breathing is non labored, even, and regular. Pt is not showing any signs of respiratory distress.

## 2018-08-29 NOTE — Discharge Summary (Signed)
Physician Discharge Summary  Patient ID: Howard Bell MRN: [id number] DOB/AGE: 1965/09/15 53 y.o.  Admit date: 08/17/2018 Discharge date: 08/30/2018  Discharge Diagnoses:  Principal Problem:   Unilateral AKA, right ([place]) Active Problems:   SIRS (systemic inflammatory response syndrome) (HCC)   Poorly controlled type 2 diabetes mellitus with peripheral neuropathy (HCC)   Peripheral arterial disease (HCC)   Dry gangrene (HCC)--left foot/left shin   Postoperative pain   Labile blood glucose   ESRD (end stage renal disease) ([place])   Hypoxia   Discharged Condition: stable   Significant Diagnostic Studies: Dg Chest 2 View  Result Date: 08/23/2018 CLINICAL DATA:  Hypoxia EXAM: CHEST - 2 VIEW COMPARISON:  08/15/2018 FINDINGS: Cardiac shadow is mildly prominent. Lungs are well aerated bilaterally with mild bibasilar chronic scarring stable from the previous exam. Vascular stents on the left are again seen and stable. No new bony abnormality is noted. IMPRESSION: Bibasilar scarring.  No acute abnormality noted. Electronically Signed   By: Inez Catalina M.D.   On: 08/23/2018 12:18   Dg Chest 2 View  Result Date: 08/15/2018 CLINICAL DATA:  Fever. Left foot infection. History of diabetes, end-stage renal disease and pneumonia. EXAM: CHEST - 2 VIEW COMPARISON:  Radiographs 05/12/2018 and 05/02/2018. CT 05/02/2018 and 11/09/2017. FINDINGS: The heart size and mediastinal contours are stable. The patient has known chronic adenopathy in the right paratracheal and subcarinal regions which appears stable. Vascular stents are noted in the left subclavian and axillary vessels. Patchy right perihilar and lower lobe airspace opacities appear largely chronic, not significantly changed over the recent prior studies. The left lung is clear. There is no pleural effusion or pneumothorax. No acute osseous findings. IMPRESSION: No significant change in the appearance of the chest compared with prior studies obtained  over the last 8 months. The right basilar airspace opacity appears chronic and may reflect postinflammatory scarring or chronic atypical inflammation. Grossly stable mediastinal adenopathy. Electronically Signed   By: Richardean Sale M.D.   On: 08/15/2018 16:13   Ct Head Wo Contrast  Result Date: 08/15/2018 CLINICAL DATA:  Altered mental status EXAM: CT HEAD WITHOUT CONTRAST TECHNIQUE: Contiguous axial images were obtained from the base of the skull through the vertex without intravenous contrast. COMPARISON:  Head CT 11/06/2017 FINDINGS: Brain: No acute intracranial hemorrhage. Unchanged appearance of right parietal, left occipital and right cerebellar infarcts. No midline shift or other mass effect. Bilateral basal ganglia mineralization. Vascular: No abnormal hyperdensity of the major intracranial arteries or dural venous sinuses. No intracranial atherosclerosis. Skull: The visualized skull base, calvarium and extracranial soft tissues are normal. Sinuses/Orbits: No fluid levels or advanced mucosal thickening of the visualized paranasal sinuses. No mastoid or middle ear effusion. The orbits are normal. IMPRESSION: 1. No acute abnormality. 2. Unchanged appearance of left occipital, right parietal and right cerebellar infarcts. Electronically Signed   By: Ulyses Jarred M.D.   On: 08/15/2018 21:48   Dg Foot Complete Left  Result Date: 08/15/2018 CLINICAL DATA:  Fever. Left foot infection. History of diabetes, end-stage renal disease and pneumonia. EXAM: LEFT FOOT - COMPLETE 3+ VIEW COMPARISON:  None. FINDINGS: There is no evidence of acute fracture or dislocation. There are possible old healed fractures of the 4th and 5th metatarsal necks. There are minimal degenerative changes at the 1st metatarsophalangeal joint. No erosive changes or bone destruction identified. There are prominent vascular calcifications consistent with diabetes. The soft tissues are diffusely prominent without focal swelling or foreign  body. IMPRESSION: No acute osseous findings or evidence  of osteomyelitis. Prominent vascular calcifications. Electronically Signed   By: Richardean Sale M.D.   On: 08/15/2018 16:15    Labs:  Basic Metabolic Panel: Recent Labs  Lab 08/25/18 0508 08/28/18 1420 08/30/18 0719  NA 126* 129* 132*  K 5.3* 5.2* 4.5  CL 89* 91* 94*  CO2 23 24 26   GLUCOSE 132* 119* 160*  BUN 55* 68* 53*  CREATININE 10.67* 11.59* 10.31*  CALCIUM 8.7* 9.5 9.3  PHOS  --  3.1 3.9    CBC: Recent Labs  Lab 08/25/18 1238 08/28/18 1420 08/30/18 0653  WBC 6.3 9.5 7.4  HGB 10.7* 8.2* 8.0*  HCT 33.1* 25.7* 26.4*  MCV 89.2 92.1 93.3  PLT 219 251 217    CBG: Recent Labs  Lab 08/29/18 1152 08/29/18 1652 08/29/18 2112 08/30/18 0547 08/30/18 1358  GLUCAP 197* 162* 156* 181* 138*    Brief HPI:   Howard Bell is a 53 year old male with history of HTN, T2DM, ESRD, PAD 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  He and underwent revision to right AKA by Dr. Sharol Given.  Postop had issues with pain control therefore gabapentin added for neuropathic pain.  Therapy evaluations revealed deficits in mobility and ADLs therefore CIR was recommended for follow-up therapy   Hospital Course: Howard Bell was admitted to rehab 08/17/2018 for inpatient therapies to consist of PT and OT at least three hours five days a week. Past admission physiatrist, therapy team and rehab RN have worked together to provide customized collaborative inpatient rehab.  Hemodialysis was ongoing MWF at end of the day to help improve tolerance of therapy.  Pain was controlled with as needed use of oxycodone and tramadol.  He was weaned off oxygen as respiratory status improved.  Wound VAC was removed on 5/22 and incision was noted to be healing well without signs of infection.  Left foot was noted to have multiple ulcers with dry gangrene  as well as mild erythema.  Dr. Sharol Given was consulted for input and patient was placed on doxycycline and Silvadene added to wounds with daily dressing changes.  His blood sugars were monitored on a CHS basis and Glucotrol was added to help improve blood sugar control.  He he developed significant hypoglycemia on 5/25 therefore Glucotrol was discontinued.  He was also noted to have increasing confusion and developed a fever of 101 as well as order from left shin/foot wounds on 5/26.  He was pancultured and vancomycin and cefepime were added empirically.  Tried hospitalist was consulted for input and recommended close monitoring on acute unit due to concerns of sepsis.    His rehab stay was interrupted from  5/26 to 5/28 due to transfer to acute. Dr. Sharol Given evaluated patient and did not feel that LLE was source of fever. He deffervesced with resolution of lethargy and was transferred back on 05/28/0 to resume his CIR program. CXR and blood cultures were negative for infection. He was maintained on broad spectrum antibiotics for 5 days till cultures finalized. He has been afebrile off antibiotics and leucocytosis has resolved.  Anemia of chronic disease has improved on aranesp with Hgb up to 10.7 at discharge. He did have recurrent hypoxia with activity and requires 2-3 litera oxygen per [place] with activity and 4 L at nights. He reported having history of OSA and was using CPAP therefore was advised to resume this with  oxygen bleed in. His blood sugars have been managed with use of Novolog SSI.  Blood pressures remain labile and being managed by nephrology. Right AKA incision is C/D/I and gangrenous ulcers on left foot /shin remain dry. Erythema/edema LLE has resolved with local care.  Pain has been controlled with prn use of oxycodone and low dose robaxin.  His mentation and endurance levels have improved. He has progressed to supervision -min assist level and will continue to receive follow up [place], [place] and [place] by  [facility] after discharge.    Rehab course: During patient's stay in rehab weekly team conferences were held to monitor patient's progress, set goals and discuss barriers to discharge. At admission, patient required mod assist with mobility and max assist with ADL tasks.  Speech therapy was added 5/26 due to moderate cognitive linguistic deficits which were impacting STM and safety with basic tasks. He  has had improvement in activity tolerance, balance, postural control as well as ability to compensate for deficits. He requires min assist for bathing, mod assist with LB dressing and toileting and supervision for transfers to [facility]. He requires CGA for lateral scoot transfers with use of SB. He is able to propel his wheelchair with supervision. He requires min assist to complete functional and familiar tasks, basic problem solving and for recall. Family education was completed regarding all aspects of care.    Disposition: Home  Diet: Renal/Carb Modified.   Special Instructions: 1. Check blood sugars before meals and use sliding scale novolog. 2. Twice a day cleanse left leg ulcers with soap and water. Pat dry and apply a layer of silvadene the cover with dry dressing. 3. Continue to use oxygen during the day and use oxygen bled in thorough CPAP at nights.     Discharge Instructions    Ambulatory referral to Physical Medicine Rehab   Complete by: As directed    1-2 weeks transitional care appt     Allergies as of 08/30/2018   No Known Allergies     Medication List    STOP taking these medications   loratadine 10 MG tablet Commonly known as: CLARITIN   Melatonin 3 MG Tabs   ondansetron 4 MG tablet Commonly known as: ZOFRAN     TAKE these medications   acetaminophen 325 MG tablet Commonly known as: TYLENOL Take 1-2 tablets (325-650 mg total) by mouth every 4 (four) hours as needed for mild pain.   Admelog SoloStar 100 UNIT/ML KwikPen Generic drug: insulin  lispro Inject 1-9 Units into the skin 3 (three) times daily. If bs is 121-150=1 unit, 151-200=2 units, 201-250=3 units, 251-300=5 units, 301-350=7 units, 351-400=9 units Notes to patient: Need to check blood sugars before meals and use above sliding scale   albuterol 108 (90 Base) MCG/ACT inhaler Commonly known as: VENTOLIN HFA Inhale 1 puff into the lungs every 4 (four) hours as needed for wheezing or shortness of breath.   brimonidine 0.2 % ophthalmic solution Commonly known as: ALPHAGAN Place 1 drop into both eyes 2 (two) times daily.   calcium acetate 667 MG capsule Commonly known as: PHOSLO Take 2 capsules (1,334 mg total) by mouth 3 (three) times daily with meals. What changed: how much to take   docusate sodium 100 MG capsule Commonly known as: COLACE Take 1 capsule (100 mg total) by mouth 2 (two) times daily. Notes to patient: For constipation   fluticasone 50 MCG/ACT nasal spray Commonly known as: FLONASE Place 1 spray into both nostrils daily.  latanoprost 0.005 % ophthalmic solution Commonly known as: XALATAN Place 1 drop into both eyes at bedtime.   methocarbamol 500 MG tablet Commonly known as: ROBAXIN Take 0.5 tablets (250 mg total) by mouth 2 (two) times daily as needed for muscle spasms.   metoCLOPramide 5 MG tablet Commonly known as: Reglan Take 1 tablet (5 mg total) by mouth 3 (three) times daily before meals. What changed:   when to take this  reasons to take this   metoprolol succinate 50 MG 24 hr tablet Commonly known as: TOPROL-XL Take 1 tablet (50 mg total) by mouth at bedtime. Take with a meal. What changed: additional instructions   omeprazole 20 MG capsule Commonly known as: PRILOSEC Take 1 capsule (20 mg total) by mouth at bedtime.   oxyCODONE 5 MG immediate release tablet--Rx # 30 pills.  Commonly known as: Oxy IR/ROXICODONE Take 1 tablet (5 mg total) by mouth every 6 (six) hours as needed for severe pain. What changed:    medication strength  how much to take  when to take this  reasons to take this   sevelamer carbonate 800 MG tablet Commonly known as: RENVELA Take 1 tablet (800 mg total) by mouth 3 (three) times daily with meals. What changed: See the new instructions.   silver sulfADIAZINE 1 % cream Commonly known as: SILVADENE Apply topically daily. Notes to patient: Cleanse left leg then apply a layer of silvadene twice a day.    timolol 0.5 % ophthalmic solution Commonly known as: BETIMOL Place 1 drop into both eyes 2 (two) times daily.      Follow-up Information    Horald Pollen, MD Follow up on 09/14/2018.   Specialty: Internal Medicine Why: @ 11:00 am ([facility] for hospital follow up appt) Contact information: [place] [address] [phone]        Newt Minion, MD. Call.   Specialty: Orthopedic Surgery Why: for post op appointment Contact information: [place] [address] [phone]        Jamse Arn, MD Follow up.   Specialty: Physical Medicine and Rehabilitation Why: Office will call you with follow up appointment Contact information: [address] [place] [address] [phone]           Signed: Bary Leriche 08/31/2018, 10:40 AM

## 2018-08-29 NOTE — Progress Notes (Signed)
Occupational Therapy Discharge Summary  Patient Details  Name: Howard Bell MRN: [id number] Date of Birth: 11/23/1965     Patient has met 5 of 5 long term goals due to improved activity tolerance, improved balance, ability to compensate for deficits, improved attention and improved awareness.  Patient to discharge at overall Mod Assist level with LB dressing and toileting, min bathing, supervision with BSC transfers.  Patient's care partner is independent to provide the necessary physical and cognitive assistance at discharge.    Reasons goals not met: n/a  Recommendation:  Patient will benefit from ongoing skilled OT services in home health setting to continue to advance functional skills in the area of BADL.  Equipment: wide drop arm BSC  Reasons for discharge: treatment goals met  Patient/family agrees with progress made and goals achieved: Yes  OT Discharge Precautions/Restrictions  Precautions Precautions: Fall Restrictions Weight Bearing Restrictions: Yes RLE Weight Bearing: Non weight bearing  ADL ADL Eating: Independent Grooming: Setup Upper Body Bathing: Setup Where Assessed-Upper Body Bathing: Other (Comment)(BSC) Lower Body Bathing: Minimal assistance Where Assessed-Lower Body Bathing: Other (Comment)(BSC) Upper Body Dressing: Setup Lower Body Dressing: Moderate assistance Where Assessed-Lower Body Dressing: Other (Comment)(BSC) Toileting: Moderate assistance Where Assessed-Toileting: Bedside Commode Toilet Transfer: Close supervision Toilet Transfer Method: Theatre manager: Extra wide drop arm bedside commode Vision Baseline Vision/History: Wears glasses Wears Glasses: At all times Vision Assessment?: No apparent visual deficits Perception  Perception: Within Functional Limits Praxis Praxis: Intact Cognition Overall Cognitive Status: Impaired/Different from baseline Arousal/Alertness: Awake/alert Orientation Level:  Oriented X4 Attention: Sustained Sustained Attention: Impaired Sustained Attention Impairment: Functional complex Memory: Impaired Memory Impairment: Decreased short term memory Awareness: Impaired Awareness Impairment: Anticipatory impairment Problem Solving: Impaired Problem Solving Impairment: Functional complex Safety/Judgment: Impaired Sensation Sensation Light Touch: Impaired by gross assessment Hot/Cold: Not tested Proprioception: Not tested Stereognosis: Not tested Additional Comments: decreased on L foot Coordination Gross Motor Movements are Fluid and Coordinated: No Fine Motor Movements are Fluid and Coordinated: No Coordination and Movement Description: decreased due to insensate foot on L LE and in hands along with L hand contracture Motor  Motor Motor: Within Functional Limits Motor - Discharge Observations: generalized weakness Mobility  Bed Mobility Bed Mobility: Rolling Right;Rolling Left;Right Sidelying to Sit Rolling Right: Independent with assistive device Rolling Left: Independent with assistive device Right Sidelying to Sit: Independent with assistive device Transfers Sit to Stand: Contact Guard/Touching assist Stand to Sit: Contact Guard/Touching assist  Trunk/Postural Assessment  Cervical Assessment Cervical Assessment: Exceptions to WFL(forward head) Thoracic Assessment Thoracic Assessment: Exceptions to WFL(rounded shoulders) Lumbar Assessment Lumbar Assessment: Exceptions to WFL(PPT) Postural Control Postural Control: Within Functional Limits  Balance Balance Balance Assessed: Yes Dynamic Sitting Balance Dynamic Sitting - Balance Support: Feet supported Dynamic Sitting - Level of Assistance: 6: Modified independent (Device/Increase time) Dynamic Sitting - Balance Activities: Reaching across midline;Reaching for objects Static Standing Balance Static Standing - Balance Support: Bilateral upper extremity supported Static Standing - Level  of Assistance: 5: Stand by assistance Dynamic Standing Balance Dynamic Standing - Balance Support: Bilateral upper extremity supported Dynamic Standing - Level of Assistance: 4: Min assist Dynamic Standing - Balance Activities: Forward lean/weight shifting Extremity/Trunk Assessment RUE Assessment RUE Assessment: Within Functional Limits LUE Assessment LUE Assessment: Within Functional Limits   Howard Bell 08/29/2018, 1:42 PM

## 2018-08-29 NOTE — Progress Notes (Addendum)
[facility] Progress Note   Dialysis Orders: SW MWF 4h 4min 450/2x 95.5kg 2/2.25 bath P2 Hep 9000 L AVF -Parsabiv 10mg  TIW -Mircera 150 mcg IV q 2 weeks (last 5/4)  Problem/Plan: 1. 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 Sharol Given. -Now in REHAB 2. LLE gangrene (new): Concern that was infected; BCx drawn 5/26 negative. . Dr. Sharol Given consulted and has recommended L AKA in the future - no urgent need, not felt to be infected. 3. ESRD: MWF- next HDWed using 2 K 2 Ca bath Na improving with volume removal  4. Hypertension/volume: BP controlled, will need to lower EDW on discharge. Continue to serially lower volume- net UF 3.5  6/05  with post wt 94.5. Had CXR pre HD6/03due to SOB which SOB - no acute findings - lower edw  @ dc -net UF Monday - 4 L post wt 93.6 - continue to titrate volume down - sats marginal 5. Anemia: Hgb9.0 5/29> 8.3 6/3>10.7 6/5 > 8.2 6/8 -.Continue Aranesp182mcg q Monday. tsat 22% 5/22/ferritin 1300s - likely inflammatory- had 2 doses ferrlicit 6. Metabolic bone disease: Ca/P ok Reduced Ca acetate binder.to 2 ac 5/30 - Continue Renvela also as binder. ResumeParsabivafter d/c will help with elevated correcged Ca  7. T2DM:Insulin per primary. Hypoglycemia overnight 5/25 - glucotrol stopped. 8. Twitchiness: resolved  with holding gabapentin. 9. AMS (5/26 - 5/27): Hypoglycemia v. meds v. delirium v. L heel gangrene - resolved;  10.   Disp d/cfrom CIR planned for 6/10   Myriam Jacobson, [place] [phone] 08/29/2018,9:30 AM  LOS: 12 days   Pt seen, examined and agree w A/P as above.  Kelly Splinter  MD 08/29/2018, 2:21 PM    Subjective:   Slept through dialysis Monday - problems with phantom pain last night.  Wfie coming in today for PT education.  Objective Vitals:   08/28/18 2300 08/29/18 0554 08/29/18 0601 08/29/18 0645  BP:   (!) 149/55   Pulse:   79   Resp:   18   Temp:   98.7 F  (37.1 C)   TempSrc:   Oral   SpO2: 92%  90% 92%  Weight:  95.3 kg    Height:       Physical Exam General: NAD sitting in bed - some facial edema Heart: RRR Lungs: few crackles bases Abdomen: obese slight destended NT Extremities: right AKA  And LLE 1 + LE edema Dialysis Access:  Left upper AVF + bruit   Additional Objective Labs: Basic Metabolic Panel: Recent Labs  Lab 08/23/18 1243 08/25/18 0508 08/28/18 1420  NA 127* 126* 129*  K 5.5* 5.3* 5.2*  CL 91* 89* 91*  CO2 21* 23 24  GLUCOSE 81 132* 119*  BUN 98* 55* 68*  CREATININE 14.58* 10.67* 11.59*  CALCIUM 9.4 8.7* 9.5  PHOS 3.6  --  3.1   Liver Function Tests: Recent Labs  Lab 08/23/18 1243 08/28/18 1420  ALBUMIN 2.2* 2.3*   No results for input(s): LIPASE, AMYLASE in the last 168 hours. CBC: Recent Labs  Lab 08/23/18 1243 08/25/18 1238 08/28/18 1420  WBC 8.4 6.3 9.5  HGB 8.3* 10.7* 8.2*  HCT 26.0* 33.1* 25.7*  MCV 89.7 89.2 92.1  PLT 267 219 251   Blood Culture    Component Value Date/Time   SDES BLOOD LEFT ANTECUBITAL 08/15/2018 1526   SPECREQUEST  08/15/2018 1526    BOTTLES DRAWN AEROBIC ONLY Blood Culture adequate volume   CULT  08/15/2018 1526  NO GROWTH 5 DAYS Performed at [facility], [place] [address] [place], [place] [address]    REPTSTATUS 08/20/2018 FINAL 08/15/2018 1526    Cardiac Enzymes: No results for input(s): CKTOTAL, CKMB, CKMBINDEX, TROPONINI in the last 168 hours. CBG: Recent Labs  Lab 08/28/18 0613 08/28/18 1151 08/28/18 1820 08/28/18 2149 08/29/18 0555  GLUCAP 139* 144* 83 188* 118*   Iron Studies: No results for input(s): IRON, TIBC, TRANSFERRIN, FERRITIN in the last 72 hours. Lab Results  Component Value Date   INR 1.2 08/15/2018   Studies/Results: No results found. Medications:  . brimonidine  1 drop Both Eyes BID  . calcium acetate  1,334 mg Oral TID WC  . Chlorhexidine Gluconate Cloth  6 each Topical Q0600  . darbepoetin (ARANESP) injection -  DIALYSIS  150 mcg Intravenous Q Wed-HD  . docusate sodium  100 mg Oral BID  . fluticasone  1 spray Each Nare Daily  . heparin  5,000 Units Subcutaneous Q8H  . insulin aspart  0-7 Units Subcutaneous TID WC  . latanoprost  1 drop Both Eyes QHS  . metoCLOPramide  5 mg Oral BID AC  . metoprolol succinate  50 mg Oral QHS  . pantoprazole  40 mg Oral Daily  . sevelamer carbonate  800 mg Oral TID WC  . silver sulfADIAZINE   Topical Daily  . sodium chloride flush  3 mL Intravenous Q12H  . timolol  1 drop Both Eyes BID

## 2018-08-29 NOTE — Progress Notes (Signed)
Occupational Therapy Session Note  Patient Details  Name: Howard Bell MRN: [id number] Date of Birth: Dec 20, 1965  Today's Date: 08/29/2018 OT Individual Time: 1105-1200 OT Individual Time Calculation (min): 55 min    Short Term Goals: Week 1:    Week 2:  OT Short Term Goal 1 (Week 2): Patient will complete toileting with Min assist  OT Short Term Goal 1 - Progress (Week 2): Not progressing OT Short Term Goal 2 (Week 2): Patient will complete toilet transfers with Min assist and appropriate DME as needed OT Short Term Goal 2 - Progress (Week 2): Met Week 3:  OT Short Term Goal 1 (Week 3): STGs = LTGs.  LTGs revised 08/25/18.      Skilled Therapeutic Interventions/Progress Updates:    Pt seen this session for family education with his spouse.  Pt declined bathing this session, reporting that he completed it early this am with nurse tech. He was agreeable to transferring to [facility] to demonstrate to his spouse how he can bathe sitting on BSC.   Reviewed board placement with his spouse.  Once placed, pt could transfer with S only.  On BSC, used lateral leans to doff pants with min A.  Pt could then take a washcloth to wash front and back of bottom. He can also reach with toileting.    Pt then worked on lateral leans to pull pants up with mod A.  He used board with S to transfer back to w/c.  Discussed showering at home.  Wife has a tub bench but they are unsure if w/c with fit into bathroom.  Recommended she has HHOT to assess transfer to ensure pt can get into bathroom with w/c and then transfer safely.  She will also need to ask [facility] nurse if pt needs to have L foot covered for shower.   Recommended board be used to tub bench with towel on top of board.    Pt worked on [employer] using orange level 2 theraband, AROM and w./c pushups. Provided wife with printed HEP.  Pt resting in wc with wife in the room with all needs met.  Therapy Documentation Precautions:  Precautions Precautions:  Fall Restrictions Weight Bearing Restrictions: Yes RLE Weight Bearing: Non weight bearing  Pain: Pain Assessment Pain Score: 3  Pain Type: Neuropathic pain Pain Location: Leg Pain Orientation: Right Pain Descriptors / Indicators: Sharp;Discomfort Pain Onset: On-going Pain Intervention(s): Repositioned    Therapy/Group: Individual Therapy  [place] 08/29/2018, 1:30 PM

## 2018-08-29 NOTE — Progress Notes (Signed)
Speech Language Pathology Discharge Summary  Patient Details  Name: Howard Bell MRN: [id number] Date of Birth: [birth year]  Today's Date: 08/29/2018 SLP Individual Time: 1000-1055 SLP Individual Time Calculation (min): 55 min   Skilled Therapeutic Interventions: Skilled treatment session focused on completion of education with the patient and his wife. SLP provided education in regards to patient's current cognitive impairments and strategies to utilize at home to maximize short-term recall, attention and overall safety and problem solving at home during functional tasks. Also educated on the importance of 24 hour supervision to maximize safety and safe decision-making. She verbalized understanding of all information and handouts were also given. Patient will discharge home tomorrow with supervision from family. Patient left in wheelchair with wife present.   Patient has met 3 of 3 long term goals.  Patient to discharge at [facility] level.   Reasons goals not met: N/A   Clinical Impression/Discharge Summary: Patient has made functional gains and has met 3 of 3 LTGs this admission. Currently, patient requires overall Min A verbal and visual cues to complete functional and familiar tasks safely in regards to sustained attention, problem solving with basic tasks and recall with use of strategies. Patient and family education is complete and patient will discharge home with 24 hour supervision from family. Patient would benefit from f/u SLP services to maximize his cognitive functioning and overall functional independence in order to reduce caregiver burden.   Care Partner:  Caregiver Able to Provide Assistance: Yes  Type of Caregiver Assistance: Physical;Cognitive  Recommendation:  24 hour supervision/assistance;Home Health SLP  Rationale for SLP Follow Up: Reduce caregiver burden;Maximize cognitive function and independence   Equipment: N/A   Reasons for discharge: Discharged from  hospital;Treatment goals met   Patient/Family Agrees with Progress Made and Goals Achieved: Yes    Hibba Schram 08/29/2018, 12:35 PM

## 2018-08-29 NOTE — Progress Notes (Signed)
Physical Therapy Discharge Summary  Patient Details  Name: Howard Bell MRN: [id number] Date of Birth: 10-27-1965  Today's Date: 08/29/2018  Patient has met 5 of 8 long term goals due to improved activity tolerance, improved balance, increased strength and ability to compensate for deficits.  Patient to discharge at a wheelchair level Supervision.   Patient's care partner is independent to provide the necessary physical assistance at discharge.  Reasons goals not met: Patient with waxing and waning cognition during rehab stay due to medications, dialysis and medical issues with ulcer on L foot.  He had difficulty with standing balance and sit to stand due to L foot issues still needing min A for these tasks.  He has R shoulder pain and L hand contracture limiting independence with longer distances in w/c.  Currently CGA for car transfer with slide board and wife will have [place] for the next week and can use slide board, when she gets truck may need to use stand pivot which she practiced today and pt needs min A with RW.  Recommendation:  Patient will benefit from ongoing skilled PT services in home health setting to continue to advance safe functional mobility, address ongoing impairments in awareness, strength, balance, safety and minimize fall risk.  Equipment: wheelchair, RW, slde board  Reasons for discharge: discharge from hospital  Patient/family agrees with progress made and goals achieved: Yes  PT Discharge Precautions/Restrictions Precautions Precautions: Fall Restrictions Weight Bearing Restrictions: Yes RLE Weight Bearing: Non weight bearing Vital Signs  Pain Pain Assessment Pain Score: 3  Pain Type: Neuropathic pain Pain Location: Leg Pain Orientation: Right Pain Descriptors / Indicators: Sharp;Discomfort Pain Onset: On-going Pain Intervention(s): Repositioned Vision/Perception  Perception Perception: Within Functional Limits Praxis Praxis: Intact   Cognition Overall Cognitive Status: Impaired/Different from baseline Arousal/Alertness: Awake/alert Orientation Level: Oriented X4 Attention: Sustained Sustained Attention: Impaired Sustained Attention Impairment: Functional complex Memory: Impaired Memory Impairment: Decreased short term memory Awareness: Impaired Awareness Impairment: Anticipatory impairment Problem Solving: Impaired Problem Solving Impairment: Functional complex Safety/Judgment: Impaired Sensation Sensation Light Touch: Impaired by gross assessment Hot/Cold: Not tested Proprioception: Not tested Stereognosis: Not tested Additional Comments: decreased on L foot Coordination Gross Motor Movements are Fluid and Coordinated: No Fine Motor Movements are Fluid and Coordinated: No Coordination and Movement Description: decreased due to insensate foot on L LE and in hands along with L hand contracture Motor  Motor Motor: Within Functional Limits Motor - Discharge Observations: generalized weakness  Mobility Bed Mobility Bed Mobility: Rolling Right;Rolling Left;Right Sidelying to Sit Rolling Right: Independent with assistive device Rolling Left: Independent with assistive device Right Sidelying to Sit: Independent with assistive device Transfers Transfers: Sit to Stand;Stand to Sit;Lateral/Scoot Transfers Sit to Stand: Contact Guard/Touching assist Stand to Sit: Contact Guard/Touching assist Stand Pivot Transfers: Minimal Assistance - Patient > 75% Stand Pivot Transfer Details: Verbal cues for technique;Verbal cues for precautions/safety;Verbal cues for safe use of DME/AE Lateral/Scoot Transfers: Supervision/Verbal cueing Transfer (Assistive device): Other (Comment)(slide board) Locomotion  Gait Ambulation: No Architect: Yes Wheelchair Assistance: Chartered loss adjuster: Both upper extremities Wheelchair Parts Management:  Supervision/cueing Distance: 150'  Trunk/Postural Assessment  Cervical Assessment Cervical Assessment: Exceptions to WFL(forward head) Thoracic Assessment Thoracic Assessment: Exceptions to WFL(rounded shoulders) Lumbar Assessment Lumbar Assessment: Exceptions to WFL(PPT) Postural Control Postural Control: Within Functional Limits  Balance Balance Balance Assessed: Yes Dynamic Sitting Balance Dynamic Sitting - Balance Support: Feet supported Dynamic Sitting - Level of Assistance: 6: Modified independent (Device/Increase time) Dynamic Sitting - Balance Activities: Reaching  across midline;Reaching for objects Static Standing Balance Static Standing - Balance Support: Bilateral upper extremity supported Static Standing - Level of Assistance: 5: Stand by assistance Dynamic Standing Balance Dynamic Standing - Balance Support: Bilateral upper extremity supported Dynamic Standing - Level of Assistance: 4: Min assist Dynamic Standing - Balance Activities: Forward lean/weight shifting Extremity Assessment      RLE Assessment Active Range of Motion (AROM) Comments: WFL General Strength Comments: grossly 4/5 LLE Assessment Active Range of Motion (AROM) Comments: limited ankle DF due to weakness General Strength Comments: strength grossly 4/5    Reginia Naas  Magda Kiel, PT 08/29/2018, 12:45 PM

## 2018-08-29 NOTE — Progress Notes (Signed)
[place] PHYSICAL MEDICINE & REHABILITATION PROGRESS NOTE  Subjective/Complaints: Patient seen sitting up in the edge of his bed this morning.  Good sitting balance noted.  He states he slept well overnight.  Discussed supplemental oxygen with nursing.  He was seen by nephro yesterday, notes reviewed.  ROS: Denies CP, shortness of breath, nausea, vomiting, diarrhea.  Objective: Vital Signs: Blood pressure (!) 149/55, pulse 79, temperature 98.7 F (37.1 C), temperature source Oral, resp. rate 18, height 6' (1.829 m), weight 95.3 kg, SpO2 92 %. No results found. Recent Labs    08/28/18 1420  WBC 9.5  HGB 8.2*  HCT 25.7*  PLT 251   Recent Labs    08/28/18 1420  NA 129*  K 5.2*  CL 91*  CO2 24  GLUCOSE 119*  BUN 68*  CREATININE 11.59*  CALCIUM 9.5    Physical Exam: BP (!) 149/55 (BP Location: Right Arm)   Pulse 79   Temp 98.7 F (37.1 C) (Oral)   Resp 18   Ht 6' (1.829 m)   Wt 95.3 kg   SpO2 92%   BMI 28.49 kg/m  Constitutional: No distress . Vital signs reviewed. HENT: Normocephalic.  Atraumatic. Eyes: EOMI.  No discharge. Cardiovascular: No JVD. Respiratory: Normal effort.  GI: Non-distended. Musc: Right AKA with edema and tenderness, stable Neurological: Alert Motor: Bilateral upper extremities: 5/5 proximal distal, except for left digit contractures Right lower extremity: Hip flexion 4+/5, stable Left lower extremity: 4/5 proximal to distal, stable Skin:Left lower extremity with necrotic changes, stable.  Please see media tab.  RLE: with dressing C/D/I.  Psychiatric:Flat  Assessment/Plan: 1. 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  Physiatrist is providing close team supervision and 24 hour management of active medical problems listed below.  Physiatrist and rehab team continue to assess barriers to  discharge/monitor patient progress toward functional and medical goals  Care Tool:  Bathing  Bathing activity did not occur: Refused Body parts bathed by patient: Right arm, Left arm, Chest, Abdomen, Face, Front perineal area, Right upper leg, Left upper leg, Left lower leg   Body parts bathed by helper: Buttocks Body parts n/a: Right lower leg   Bathing assist Assist Level: Moderate Assistance - Patient 50 - 74%     Upper Body Dressing/Undressing Upper body dressing   What is the patient wearing?: Pull over shirt    Upper body assist Assist Level: Supervision/Verbal cueing    Lower Body Dressing/Undressing Lower body dressing    Lower body dressing activity did not occur: Refused What is the patient wearing?: Pants, Underwear/pull up     Lower body assist Assist for lower body dressing: Maximal Assistance - Patient 25 - 49%     Toileting Toileting Toileting Activity did not occur (Clothing management and hygiene only): (pt did not need to toilet this session)  Toileting assist Assist for toileting: Moderate Assistance - Patient 50 - 74%(A with clothing, pt can cleanse (simulated as pt did not need to toilet))     Transfers Chair/bed transfer  Transfers assist     Chair/bed transfer assist level: Supervision/Verbal cueing Chair/bed transfer assistive device: Sliding board   Locomotion Ambulation   Ambulation assist   Ambulation activity did not occur: Safety/medical concerns          Walk 10 feet activity   Assist  Walk 10 feet activity did not occur:  Safety/medical concerns        Walk 50 feet activity   Assist Walk 50 feet with 2 turns activity did not occur: Safety/medical concerns         Walk 150 feet activity   Assist Walk 150 feet activity did not occur: Safety/medical concerns         Walk 10 feet on uneven surface  activity   Assist Walk 10 feet on uneven surfaces activity did not occur: Safety/medical concerns          Wheelchair     Assist Will patient use wheelchair at discharge?: Yes Type of Wheelchair: Manual    Wheelchair assist level: Supervision/Verbal cueing Max wheelchair distance: 100    Wheelchair 50 feet with 2 turns activity    Assist        Assist Level: Supervision/Verbal cueing   Wheelchair 150 feet activity     Assist     Assist Level: Supervision/Verbal cueing      Medical Problem List and Plan: 1.Functional and mobility deficitssecondary to 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 Antithrombotics: -DVT/anticoagulation:Pharmaceutical:Heparin -antiplatelet therapy: N/A 3. Pain Management:             Continue gabapentin              Continue oxycodone prn.   Robaxin twice daily as needed started on 5/30  Relatively controlled on 6/9 4. Mood:LCSW to follow for evaluation and support. -antipsychotic agents: N/A 5. Neuropsych: This patientis not fullycapable of making decisions on hisown behalf.  DCed gabapentin and Ultram 6. Skin/Wound Care:             Discussed with Ortho and PA previously- Silvadene dressing.  Severe PVD with limited blood flow per vascular.  Discussed with nursing  Monitor right lower extremity  Monitor left lower extremity dry gangrene 7. Fluids/Electrolytes/Nutrition:Strict I/O.  8. T2DM: Hemoglobin A1c 10.1 on 5/18.  Continue to monitor BS ac/hs and use SSI for elevated BS.   Labile on 6/9  Will avoid aggressive measures as much as possible due to hypersensitivity and hypoglycemia with medications             Monitor with increased mobility 9. ESRD: HD MWF. Schedule atend of the day to allow participation with therapies during the day and to assistwith activity tolerance.              Recs per nephro 10.  Diabetic gastroparesis: On Reglan tid ac.  Will consider decreasing if necessary 11. Anemia of chronic disease: Continue Aranesp weekly for supplement.              Hemoglobin 8.2 on 6/8             Continue to monitor 12. Glaucoma: Managed with Timoptic, Alphagan and Xalatan. 13.  Labile blood pressure  Extremely labile on 6/9, recs per nephro  Monitor with increased mobility 14.  Constipation immobility with opioids  Continue bowel meds 15.  SIRS: Resolved  D/ced IV Vanc, cefepime, Flagyl.   Blood cultures no growth. 16.  Supplemental oxygen dependent  Chest x-ray reviewed- unremarkable for acute infectious process.  Wean as tolerated, will require discharge  LOS: 12 days A FACE TO FACE EVALUATION WAS PERFORMED  Jayde Daffin Lorie Phenix 08/29/2018, 10:40 AM

## 2018-08-29 NOTE — Progress Notes (Signed)
Physical Therapy Session Note  Patient Details  Name: Howard Bell MRN: [id number] Date of Birth: Mar 30, 1965  Today's Date: 08/29/2018 PT Individual Time: 6244-6950 PT Individual Time Calculation (min): 32 min   Short Term Goals: Week 1:  PT Short Term Goal 1 (Week 1): Patient to demonstrate dynamic sitting balance with S.  PT Short Term Goal 2 (Week 1): Patient to perform side board transfer with S including set up. PT Short Term Goal 3 (Week 1): Patient to propel w/c mod I on level indoor surfaces. PT Short Term Goal 4 (Week 1): Patient to perform sit to stand with CGA in prep for car transfer to SUV  Skilled Therapeutic Interventions/Progress Updates:    Pt received asleep, sitting in w/c and agreeable to therapy session with pt joking frequently throughout session stating "no" when asked to perform tasks but then agreeable to participate. Pt received on 3L of O2 via nasal cannula with SpO2 97%.  Performed B UE w/c propulsion ~17ft with supervision. Reassessed O2 with pt on room air to be 92%. Assist for setup for stand pivot transfer w/c>EOM. Sit<>stand w/c/EOM<>RW with mod assist for lifting/balance throughout session. L stand pivot w/c>EOM using RW with min assist for balance and cuing for sequencing. SpO2 desaturated to 77% on room air and therapist reapplied 3L of O2 and it took 2 minutes for it to recover to 90%. Performed R stand pivot transfer EOM>w/c using RW with mod assist for balance and pivoting hips. SpO2 desaturated to 84% despite being on 3L of O2 via nasal cannula then increased back to 93% after seated rest break. Transported back to room in w/c and left sitting in w/c with RN and PA present to continue working with patient.  Therapy Documentation Precautions:  Precautions Precautions: Fall Restrictions Weight Bearing Restrictions: Yes RLE Weight Bearing: Non weight bearing  Pain: Reports some L LE pain upon coming to standing but no additional details provided -  limited time in weightbearing for pain management.   Therapy/Group: Individual Therapy  Tawana Scale, PT, DPT 08/29/2018, 1:17 PM

## 2018-08-29 NOTE — Progress Notes (Signed)
Physical Therapy Session Note  Patient Details  Name: Howard Bell MRN: [id number] Date of Birth: [birth year]  Today's Date: 08/29/2018 PT Individual Time: 0905-1000 PT Individual Time Calculation (min): 55 min   Short Term Goals: Week 2:  PT Short Term Goal 1 (Week 1): Patient to demonstrate dynamic sitting balance with S.  PT Short Term Goal 2 (Week 1): Patient to perform side board transfer with S including set up. PT Short Term Goal 3 (Week 1): Patient to propel w/c mod I on level indoor surfaces. PT Short Term Goal 4 (Week 1): Patient to perform sit to stand with CGA in prep for car transfer to SUV  Skilled Therapeutic Interventions/Progress Updates:    Patient in supine and educated on shrinker positioning and to use ace wraps for shaping due to poor compression of shrinker.  Wrapped leg in supine.  Patient able to roll in bed with rails mod I, to prone with increased time and CGA but unable to tolerate due to difficulty breathing.  Returned to side and side to sit with rail mod I.  Seated EOB reaching and leaning with foot on floor mod independent.  Patient transfer to w/c via slide board with S after board placement.  Patient propelled w/c in hallway x about 150' wife joining in for caregiver education.  Educated using handouts from [employer] about limb loss and process for prosthetic.  Also issued handouts on slide board transfer, limb wrapping and demonstrated as well as for HEP.  Patient transfer to mat to simulate car transfer with wife assist via SBT with S.  Patient and wife performed stand pivot with RW and min A.  Educated wife need for assist with this as pt more off balance to L than to R as she will have her truck back from shop in about a week and he will need to use walker for stand pivot to this vehicle.  Currently she has a Occupational hygienist and he can use slide board more safely at this time.  Patient propelled w/c to room and discussed concerns with wife for possibly needing power  chair down the road.  SW arrived and reports he can switch later if needed if needs another amputation.  Noted his personal w/c not in the room and discussed needing to call prior unit to see if they have it. Left with SLP for continued family education with wife present.  Therapy Documentation Precautions:  Precautions Precautions: Fall Restrictions Weight Bearing Restrictions: Yes RLE Weight Bearing: Non weight bearing Pain: Pain Assessment Pain Score: 3  Pain Type: Neuropathic pain Pain Location: Leg Pain Orientation: Right Pain Descriptors / Indicators: Sharp;Discomfort Pain Onset: On-going Pain Intervention(s): Repositioned    Therapy/Group: Individual Therapy  Reginia Naas  Magda Kiel, PT 08/29/2018, 12:45 PM

## 2018-08-30 LAB — CBC
HCT: 26.4 % — ABNORMAL LOW (ref 39.0–52.0)
Hemoglobin: 8 g/dL — ABNORMAL LOW (ref 13.0–17.0)
MCH: 28.3 pg (ref 26.0–34.0)
MCHC: 30.3 g/dL (ref 30.0–36.0)
MCV: 93.3 fL (ref 80.0–100.0)
Platelets: 217 10*3/uL (ref 150–400)
RBC: 2.83 MIL/uL — ABNORMAL LOW (ref 4.22–5.81)
RDW: 17.3 % — ABNORMAL HIGH (ref 11.5–15.5)
WBC: 7.4 10*3/uL (ref 4.0–10.5)
nRBC: 0 % (ref 0.0–0.2)

## 2018-08-30 LAB — RENAL FUNCTION PANEL
Albumin: 2.3 g/dL — ABNORMAL LOW (ref 3.5–5.0)
Anion gap: 12 (ref 5–15)
BUN: 53 mg/dL — ABNORMAL HIGH (ref 6–20)
CO2: 26 mmol/L (ref 22–32)
Calcium: 9.3 mg/dL (ref 8.9–10.3)
Chloride: 94 mmol/L — ABNORMAL LOW (ref 98–111)
Creatinine, Ser: 10.31 mg/dL — ABNORMAL HIGH (ref 0.61–1.24)
GFR calc Af Amer: 6 mL/min — ABNORMAL LOW (ref >60)
GFR calc non Af Amer: 5 mL/min — ABNORMAL LOW (ref >60)
Glucose, Bld: 160 mg/dL — ABNORMAL HIGH (ref 70–99)
Phosphorus: 3.9 mg/dL (ref 2.5–4.6)
Potassium: 4.5 mmol/L (ref 3.5–5.1)
Sodium: 132 mmol/L — ABNORMAL LOW (ref 135–145)

## 2018-08-30 LAB — GLUCOSE, CAPILLARY
Glucose-Capillary: 181 mg/dL — ABNORMAL HIGH (ref 70–99)
Glucose-Capillary: 138 mg/dL — ABNORMAL HIGH (ref 70–99)

## 2018-08-30 MED ORDER — SODIUM CHLORIDE 0.9 % IV SOLN: 100.0000 mL | INTRAVENOUS | Status: DC | PRN

## 2018-08-30 MED ORDER — PENTAFLUOROPROP-TETRAFLUOROETH EX AERO: 1.0000 "application " | INHALATION_SPRAY | CUTANEOUS | Status: DC | PRN

## 2018-08-30 MED ORDER — ALTEPLASE 2 MG IJ SOLR: 2.0000 mg | Freq: Once | INTRAMUSCULAR | Status: DC | PRN

## 2018-08-30 MED ORDER — OMEPRAZOLE 20 MG PO CPDR: 20.0000 mg | DELAYED_RELEASE_CAPSULE | Freq: Every day | ORAL | 0 refills | Status: DC

## 2018-08-30 MED ORDER — CALCIUM ACETATE (PHOS BINDER) 667 MG PO CAPS: 1334.0000 mg | ORAL_CAPSULE | Freq: Three times a day (TID) | ORAL | 1 refills | Status: DC

## 2018-08-30 MED ORDER — LIDOCAINE-PRILOCAINE 2.5-2.5 % EX CREA
1.0000 "application " | TOPICAL_CREAM | CUTANEOUS | Status: DC | PRN
  Filled 2018-08-30: qty 5

## 2018-08-30 MED ORDER — DOCUSATE SODIUM 100 MG PO CAPS: 100.0000 mg | ORAL_CAPSULE | Freq: Two times a day (BID) | ORAL | 1 refills | Status: DC

## 2018-08-30 MED ORDER — HEPARIN SODIUM (PORCINE) 1000 UNIT/ML DIALYSIS
1000.0000 [IU] | INTRAMUSCULAR | Status: DC | PRN
  Filled 2018-08-30: qty 1

## 2018-08-30 MED ORDER — LIDOCAINE HCL (PF) 1 % IJ SOLN: 5.0000 mL | INTRAMUSCULAR | Status: DC | PRN

## 2018-08-30 MED ORDER — METHOCARBAMOL 500 MG PO TABS: 250.0000 mg | ORAL_TABLET | Freq: Two times a day (BID) | ORAL | 0 refills | Status: DC | PRN

## 2018-08-30 MED ORDER — SILVER SULFADIAZINE 1 % EX CREA: TOPICAL_CREAM | Freq: Every day | CUTANEOUS | 0 refills | Status: DC

## 2018-08-30 MED ORDER — HEPARIN SODIUM (PORCINE) 1000 UNIT/ML DIALYSIS
5000.0000 [IU] | Freq: Once | INTRAMUSCULAR | Status: DC
  Filled 2018-08-30: qty 5

## 2018-08-30 MED ORDER — OXYCODONE HCL 5 MG PO TABS: 5.0000 mg | ORAL_TABLET | Freq: Four times a day (QID) | ORAL | 0 refills | Status: DC | PRN

## 2018-08-30 MED ORDER — DARBEPOETIN ALFA 150 MCG/0.3ML IJ SOSY
PREFILLED_SYRINGE | INTRAMUSCULAR | Status: AC
  Administered 2018-08-30: 150 ug via INTRAVENOUS
  Filled 2018-08-30: qty 0.3

## 2018-08-30 MED ORDER — METOCLOPRAMIDE HCL 5 MG PO TABS: 5.0000 mg | ORAL_TABLET | Freq: Three times a day (TID) | ORAL | 0 refills | Status: AC

## 2018-08-30 MED ORDER — SEVELAMER CARBONATE 800 MG PO TABS: 800.0000 mg | ORAL_TABLET | Freq: Three times a day (TID) | ORAL | 0 refills | Status: DC

## 2018-08-30 MED ORDER — METOPROLOL SUCCINATE ER 50 MG PO TB24: 50.0000 mg | ORAL_TABLET | Freq: Every day | ORAL | 0 refills | Status: DC

## 2018-08-30 NOTE — Progress Notes (Signed)
Pt returned to unit via bed from HD. Call bell placed in reach. Will cont to monitor. Pt to DC home today as well.  Erie Noe, RN

## 2018-08-30 NOTE — Progress Notes (Signed)
Discharge Note  The overall goal for the admission was met for:   Discharge location: Yes - pt to d/c home with wife who can provide 24/7 assistance.  Length of Stay: Yes - 23 days  Discharge activity level: Yes - min assist overall  Home/community participation: Yes  Services provided included: MD, RD, PT, OT, SLP, RN, Pharmacy and [place]: Medicare  Follow-up services arranged: Home Health: RN, PT, OT, ST via [place], DME: 18x18 lightweight w/c, cushion, wide drop arm commode and oxygen via [place] and Patient/Family has no preference for HH/DME agencies  Comments (or additional information):      Contact info:  Pt's wife, Mackey Varricchio @ [phone] (pt with cognitive impairment - best to speak with wife)  Patient/Family verbalized understanding of follow-up arrangements: Yes  Individual responsible for coordination of the follow-up plan: pt/ spouse  Confirmed correct DME delivered: Lennart Pall 08/30/2018    Jashae Wiggs

## 2018-08-30 NOTE — Progress Notes (Signed)
Equipment and DC instructions completed. O2 set up on 2L/[place]. Pt wife to meet at main entrance. Pt drsg to LLE changed and educated on drsg changes and medications. Pt has no concerns or questions noted. Will transport to main lobby for DC.  Erie Noe, RN

## 2018-08-30 NOTE — Progress Notes (Signed)
Wounds examined last evening --R-AKA incision C/D/I with min edema. LLE heel and shin with dry gangrene and flaky skin. Erythema and edema has resolved.   R-AKA    Lateral aspect left leg   Medial aspect left leg

## 2018-08-30 NOTE — Progress Notes (Signed)
   SATURATION QUALIFICATIONS: (This note is used to comply with regulatory documentation for home oxygen)  Patient Saturations on Room Air at Rest = 93 %  Patient Saturations on Room Air while Ambulating = 77%  Patient Saturations on 3 Liters of oxygen while Ambulating = 90%  Please briefly explain why patient needs home oxygen:  Severe debility, CHF, ESRD, peripheral neuropathy and recent AKA.   Reesa Chew, PA-C

## 2018-08-30 NOTE — Plan of Care (Signed)
  Problem: RH BOWEL ELIMINATION Goal: RH STG MANAGE BOWEL WITH ASSISTANCE Description STG Manage Bowel with mod Assistance.  Outcome: Completed/Met Goal: RH STG MANAGE BOWEL W/MEDICATION W/ASSISTANCE Description STG Manage Bowel with Medication with mod I Assistance.   Outcome: Completed/Met   Problem: RH SKIN INTEGRITY Goal: RH STG SKIN FREE OF INFECTION/BREAKDOWN Description Patient will not develop new skin breakdown entire stay on rehab with min assist  Outcome: Completed/Met Goal: RH STG MAINTAIN SKIN INTEGRITY WITH ASSISTANCE Description STG Maintain Skin Integrity With  Mod Assistance.  Outcome: Completed/Met Goal: RH STG ABLE TO PERFORM INCISION/WOUND CARE W/ASSISTANCE Description STG Able To Perform Incision/Wound Care With mod Assistance.  Outcome: Completed/Met   Problem: RH SAFETY Goal: RH STG ADHERE TO SAFETY PRECAUTIONS W/ASSISTANCE/DEVICE Description STG Adhere to Safety Precautions With  Min Assistance/Device.   Outcome: Completed/Met Goal: RH STG DECREASED RISK OF FALL WITH ASSISTANCE Description STG Decreased Risk of Fall With min Assistance.   Outcome: Completed/Met   Problem: RH PAIN MANAGEMENT Goal: RH STG PAIN MANAGED AT OR BELOW PT'S PAIN GOAL Description Pain less than 2  Outcome: Completed/Met   Problem: RH KNOWLEDGE DEFICIT LIMB LOSS Goal: RH STG INCREASE KNOWLEDGE OF SELF CARE AFTER LIMB LOSS Description Increase self care after limb loss  with mod assistance  Outcome: Completed/Met

## 2018-08-30 NOTE — Discharge Instructions (Signed)
°  Inpatient Rehab Discharge Instructions  Howard Bell Discharge date and time: 08/30/18   Activities/Precautions/ Functional Status: Activity: no lifting, driving, or strenuous exercise  till cleared by MD.  Diet: diabetic diet and renal diet--limit fluids to 1200 cc/day Wound Care: To left leg ulcers --Twice a day cleanse with antibacterial soap and water. Pat dry. Apply a layer of silvadene.                         Right amputation site: Cleanse with soap and water. Pat dry and apply dry dressing. Change daily.   Functional status:  ___ No restrictions     ___ Walk up steps independently _X__ 24/7 supervision/assistance   ___ Walk up steps with assistance ___ Intermittent supervision/assistance  ___ Bathe/dress independently ___ Walk with walker     _X__ Bathe/dress with assistance ___ Walk Independently    ___ Shower independently ___ Walk with assistance    ___ Shower with assistance _X__ No alcohol     ___ Return to work/school ________   Special Instructions: 1. Monitor blood sugars before meals and use sliding scale insulin.  2. Use 2 liters oxygen with activity and 3 liters at bedtime. (Use CPAP at bedtime with 3 liters oxygen bleed in) 3. Contact Dr. Sharol Given if you develop any problems with your incision/wound--redness, swelling, increase in pain, drainage or if you develop fever or chills.    Home Health:   PT    OT     ST    RN                      Agency:  [facility]          Phone: [phone]   Medical Equipment/Items Ordered:  Wheelchair, cushion, wide commode and oxygen                                                      Agency/Supplier:  [facility] @ [phone]  GENERAL COMMUNITY RESOURCES FOR PATIENT/FAMILY:  Support Groups:  Amputee Support Group (see flyer)     My questions have been answered and I understand these instructions. I will adhere to these goals and the provided educational materials after my discharge from the  hospital.  Patient/Caregiver Signature _______________________________ Date __________  Clinician Signature _______________________________________ Date __________  Please bring this form and your medication list with you to all your follow-up doctor's appointments.     COMMUNITY REFERRALS UPON DISCHARGE:

## 2018-08-30 NOTE — Progress Notes (Addendum)
[facility] Progress Note   Dialysis Orders: SW MWF 4h 35min 450/2x 95.5kg 2/2.25 bath P2 Hep 9000 L AVF -Parsabiv 10mg  TIW -Mircera 150 mcg IV q 2 weeks (last 5/4)  Problem/Plan: 1. 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 Sharol Given. -for d/c today -  2. LLE gangrene (new): Concern that was infected; BCx drawn 5/26 negative. . Dr. Sharol Given consulted and has recommended L AKA in the future - no urgent need, not felt to be infected. 3. ESRD: MWF-tolerating HDWed using 2 K 2 Ca bath Na improving with volume removal K 4.5 - will inform home HD unit of changes 4. Hypertension/volume: BP controlled, will need to lower EDW on discharge. Continue to serially lower volume- net UF 3.5  6/05  with post wt 94.5. Had CXR pre HD6/03due to SOB which SOB - no acute findings - lower edw  @ dc -net UF Monday - 4 L post wt 93.6 - continue to titrate volume down -re-eval for new EDW after HD 5. Anemia: Hgb9.0 5/29> 8.3 6/3>10.7 6/5 > 8.2 > 8  6/8 -.Continue Aranesp144mcg q Monday. tsat 22% 5/22/ferritin 1300s - likely inflammatory- had 2 doses ferrlicit- increase to max ESA after d/c  6. Metabolic bone disease: Ca/P ok Reduced Ca acetate binder.to 2 ac 5/30 - Continue Renvela also as binder. ResumeParsabivafter d/c will help with elevated correcged Ca  7. T2DM:Insulin per primary. Hypoglycemia overnight 5/25 - glucotrol stopped. 8. Twitchiness: resolved  with holding gabapentin. 9. AMS (5/26 - 5/27): Hypoglycemia v. meds v. delirium v. L heel gangrene - resolved;  10.   Disp d/cfrom CIR planned today   Myriam Jacobson, PA-C [place] [phone] 08/30/2018,8:54 AM  LOS: 13 days   Pt seen, examined and agree w A/P as above.  Kelly Splinter  MD 08/30/2018, 1:01 PM    Subjective:  No c/o. Wife came yesterday.  Ready for d/c  Objective Vitals:   08/29/18 0645 08/29/18 1357 08/29/18 2022 08/30/18 0411  BP:  (!) 172/86 (!) 137/58 (!) 172/64   Pulse:  80 87 84  Resp:  19 20 19   Temp:  98.8 F (37.1 C) 98 F (36.7 C)   TempSrc:  Oral    SpO2: 92% 96% 96% 90%  Weight:      Height:       Physical Exam goal 5 L General: NAD supine on HD Heart: RRR Lungs: clear anteriorly Abdomen: obese slight destended NT Extremities: right AKA  And LLE 1 + LE edema Dialysis Access:  Left upper AVF Qb 450   Additional Objective Labs: Basic Metabolic Panel: Recent Labs  Lab 08/23/18 1243 08/25/18 0508 08/28/18 1420 08/30/18 0719  NA 127* 126* 129* 132*  K 5.5* 5.3* 5.2* 4.5  CL 91* 89* 91* 94*  CO2 21* 23 24 26   GLUCOSE 81 132* 119* 160*  BUN 98* 55* 68* 53*  CREATININE 14.58* 10.67* 11.59* 10.31*  CALCIUM 9.4 8.7* 9.5 9.3  PHOS 3.6  --  3.1 3.9   Liver Function Tests: Recent Labs  Lab 08/23/18 1243 08/28/18 1420 08/30/18 0719  ALBUMIN 2.2* 2.3* 2.3*   No results for input(s): LIPASE, AMYLASE in the last 168 hours. CBC: Recent Labs  Lab 08/23/18 1243 08/25/18 1238 08/28/18 1420 08/30/18 0653  WBC 8.4 6.3 9.5 7.4  HGB 8.3* 10.7* 8.2* 8.0*  HCT 26.0* 33.1* 25.7* 26.4*  MCV 89.7 89.2 92.1 93.3  PLT 267 219 251 217   Blood Culture  Component Value Date/Time   SDES BLOOD LEFT ANTECUBITAL 08/15/2018 1526   SPECREQUEST  08/15/2018 1526    BOTTLES DRAWN AEROBIC ONLY Blood Culture adequate volume   CULT  08/15/2018 1526    NO GROWTH 5 DAYS Performed at [facility], [place] [address] [place], [place] [address]    REPTSTATUS 08/20/2018 FINAL 08/15/2018 1526    Cardiac Enzymes: No results for input(s): CKTOTAL, CKMB, CKMBINDEX, TROPONINI in the last 168 hours. CBG: Recent Labs  Lab 08/29/18 0555 08/29/18 1152 08/29/18 1652 08/29/18 2112 08/30/18 0547  GLUCAP 118* 197* 162* 156* 181*   Iron Studies: No results for input(s): IRON, TIBC, TRANSFERRIN, FERRITIN in the last 72 hours. Lab Results  Component Value Date   INR 1.2 08/15/2018   Studies/Results: No results found. Medications:  .  brimonidine  1 drop Both Eyes BID  . calcium acetate  1,334 mg Oral TID WC  . Chlorhexidine Gluconate Cloth  6 each Topical Q0600  . darbepoetin (ARANESP) injection - DIALYSIS  150 mcg Intravenous Q Wed-HD  . docusate sodium  100 mg Oral BID  . fluticasone  1 spray Each Nare Daily  . insulin aspart  0-7 Units Subcutaneous TID WC  . latanoprost  1 drop Both Eyes QHS  . metoCLOPramide  5 mg Oral BID AC  . metoprolol succinate  50 mg Oral QHS  . pantoprazole  40 mg Oral Daily  . sevelamer carbonate  800 mg Oral TID WC  . silver sulfADIAZINE   Topical Daily  . sodium chloride flush  3 mL Intravenous Q12H  . timolol  1 drop Both Eyes BID

## 2018-08-30 NOTE — Progress Notes (Signed)
Pt requires a wide drop arm commode for home use.  Pt with right AKA and poorly healing left LE and his mobility is completely w/c level only.  Pt requires sliding board for all transfers.  The wide drop arm commode allows safe placement of the sliding board to allow safe transfers for toileting.   Reesa Chew, PA-C

## 2018-09-01 ENCOUNTER — Telehealth: Payer: Self-pay

## 2018-09-01 DIAGNOSIS — D631 Anemia in chronic kidney disease: Secondary | ICD-10-CM | POA: Diagnosis not present

## 2018-09-01 DIAGNOSIS — N2581 Secondary hyperparathyroidism of renal origin: Secondary | ICD-10-CM | POA: Diagnosis not present

## 2018-09-01 DIAGNOSIS — N186 End stage renal disease: Secondary | ICD-10-CM | POA: Diagnosis not present

## 2018-09-01 NOTE — Telephone Encounter (Signed)
Patient was called to schedule appt and spoke to wife to get him in for 09/05/2018 at 1245pm. Patient states he has dialysis on Mon, Wed and Fri .

## 2018-09-01 NOTE — Telephone Encounter (Signed)
Called to preform transitional care call, no answer on either phone number, left message to return call

## 2018-09-02 DIAGNOSIS — Z992 Dependence on renal dialysis: Secondary | ICD-10-CM | POA: Diagnosis not present

## 2018-09-02 DIAGNOSIS — I872 Venous insufficiency (chronic) (peripheral): Secondary | ICD-10-CM | POA: Diagnosis not present

## 2018-09-02 DIAGNOSIS — Z9981 Dependence on supplemental oxygen: Secondary | ICD-10-CM | POA: Diagnosis not present

## 2018-09-02 DIAGNOSIS — N186 End stage renal disease: Secondary | ICD-10-CM | POA: Diagnosis not present

## 2018-09-02 DIAGNOSIS — Z89611 Acquired absence of right leg above knee: Secondary | ICD-10-CM | POA: Diagnosis not present

## 2018-09-02 DIAGNOSIS — Z794 Long term (current) use of insulin: Secondary | ICD-10-CM | POA: Diagnosis not present

## 2018-09-02 DIAGNOSIS — E1151 Type 2 diabetes mellitus with diabetic peripheral angiopathy without gangrene: Secondary | ICD-10-CM | POA: Diagnosis not present

## 2018-09-02 DIAGNOSIS — T8743 Infection of amputation stump, right lower extremity: Secondary | ICD-10-CM | POA: Diagnosis not present

## 2018-09-02 DIAGNOSIS — E1122 Type 2 diabetes mellitus with diabetic chronic kidney disease: Secondary | ICD-10-CM | POA: Diagnosis not present

## 2018-09-02 DIAGNOSIS — I509 Heart failure, unspecified: Secondary | ICD-10-CM | POA: Diagnosis not present

## 2018-09-02 DIAGNOSIS — L97528 Non-pressure chronic ulcer of other part of left foot with other specified severity: Secondary | ICD-10-CM | POA: Diagnosis not present

## 2018-09-02 DIAGNOSIS — L97828 Non-pressure chronic ulcer of other part of left lower leg with other specified severity: Secondary | ICD-10-CM | POA: Diagnosis not present

## 2018-09-02 IMAGING — CR DG CHEST 2V
2 series · 2 of 2 positions shown · non-contrast
Comparison: 11/26/2016 chest radiograph

CLINICAL DATA: 51 y/o  M; shortness of breath.

EXAM:
CHEST  2 VIEW

[w chest pa]
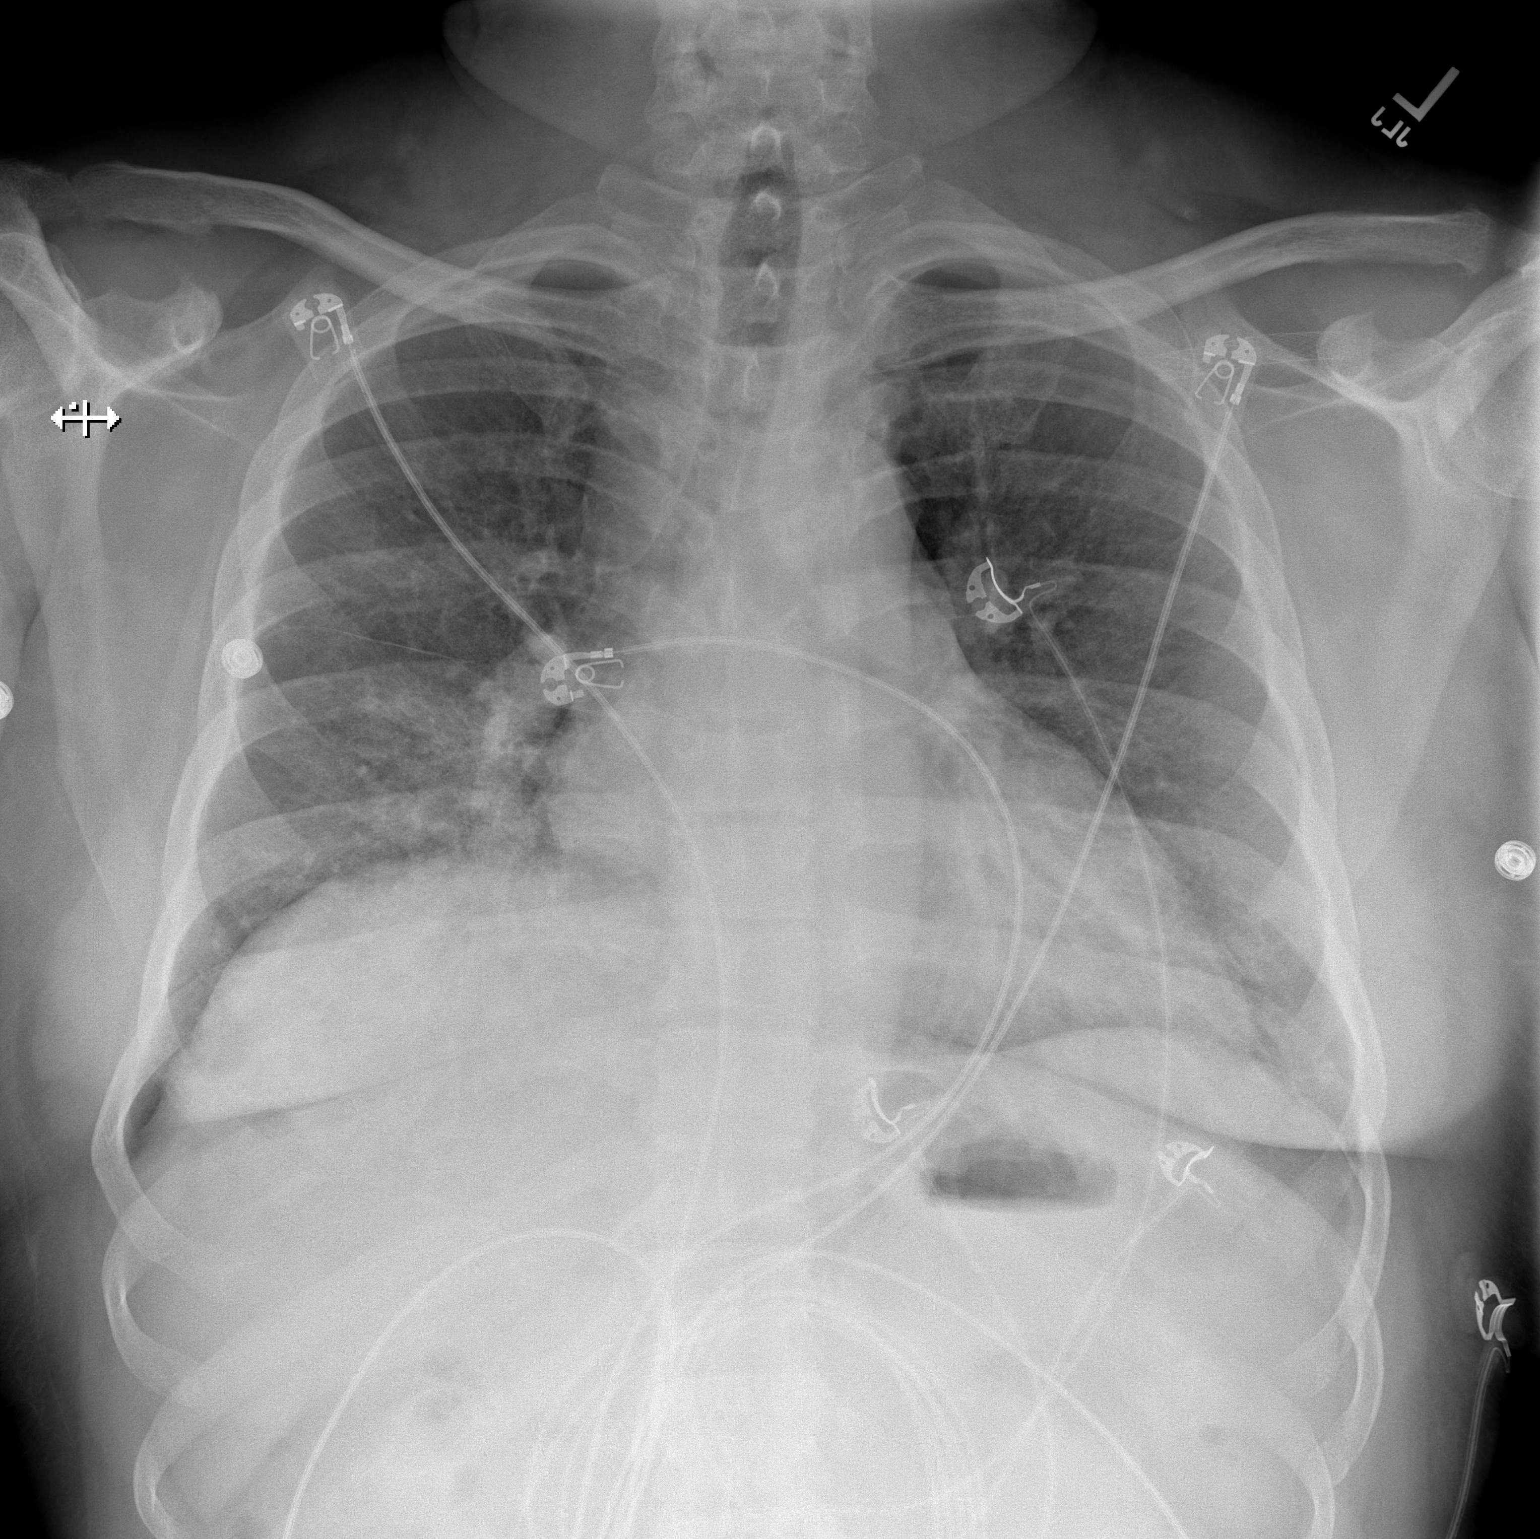

[w chest lat]
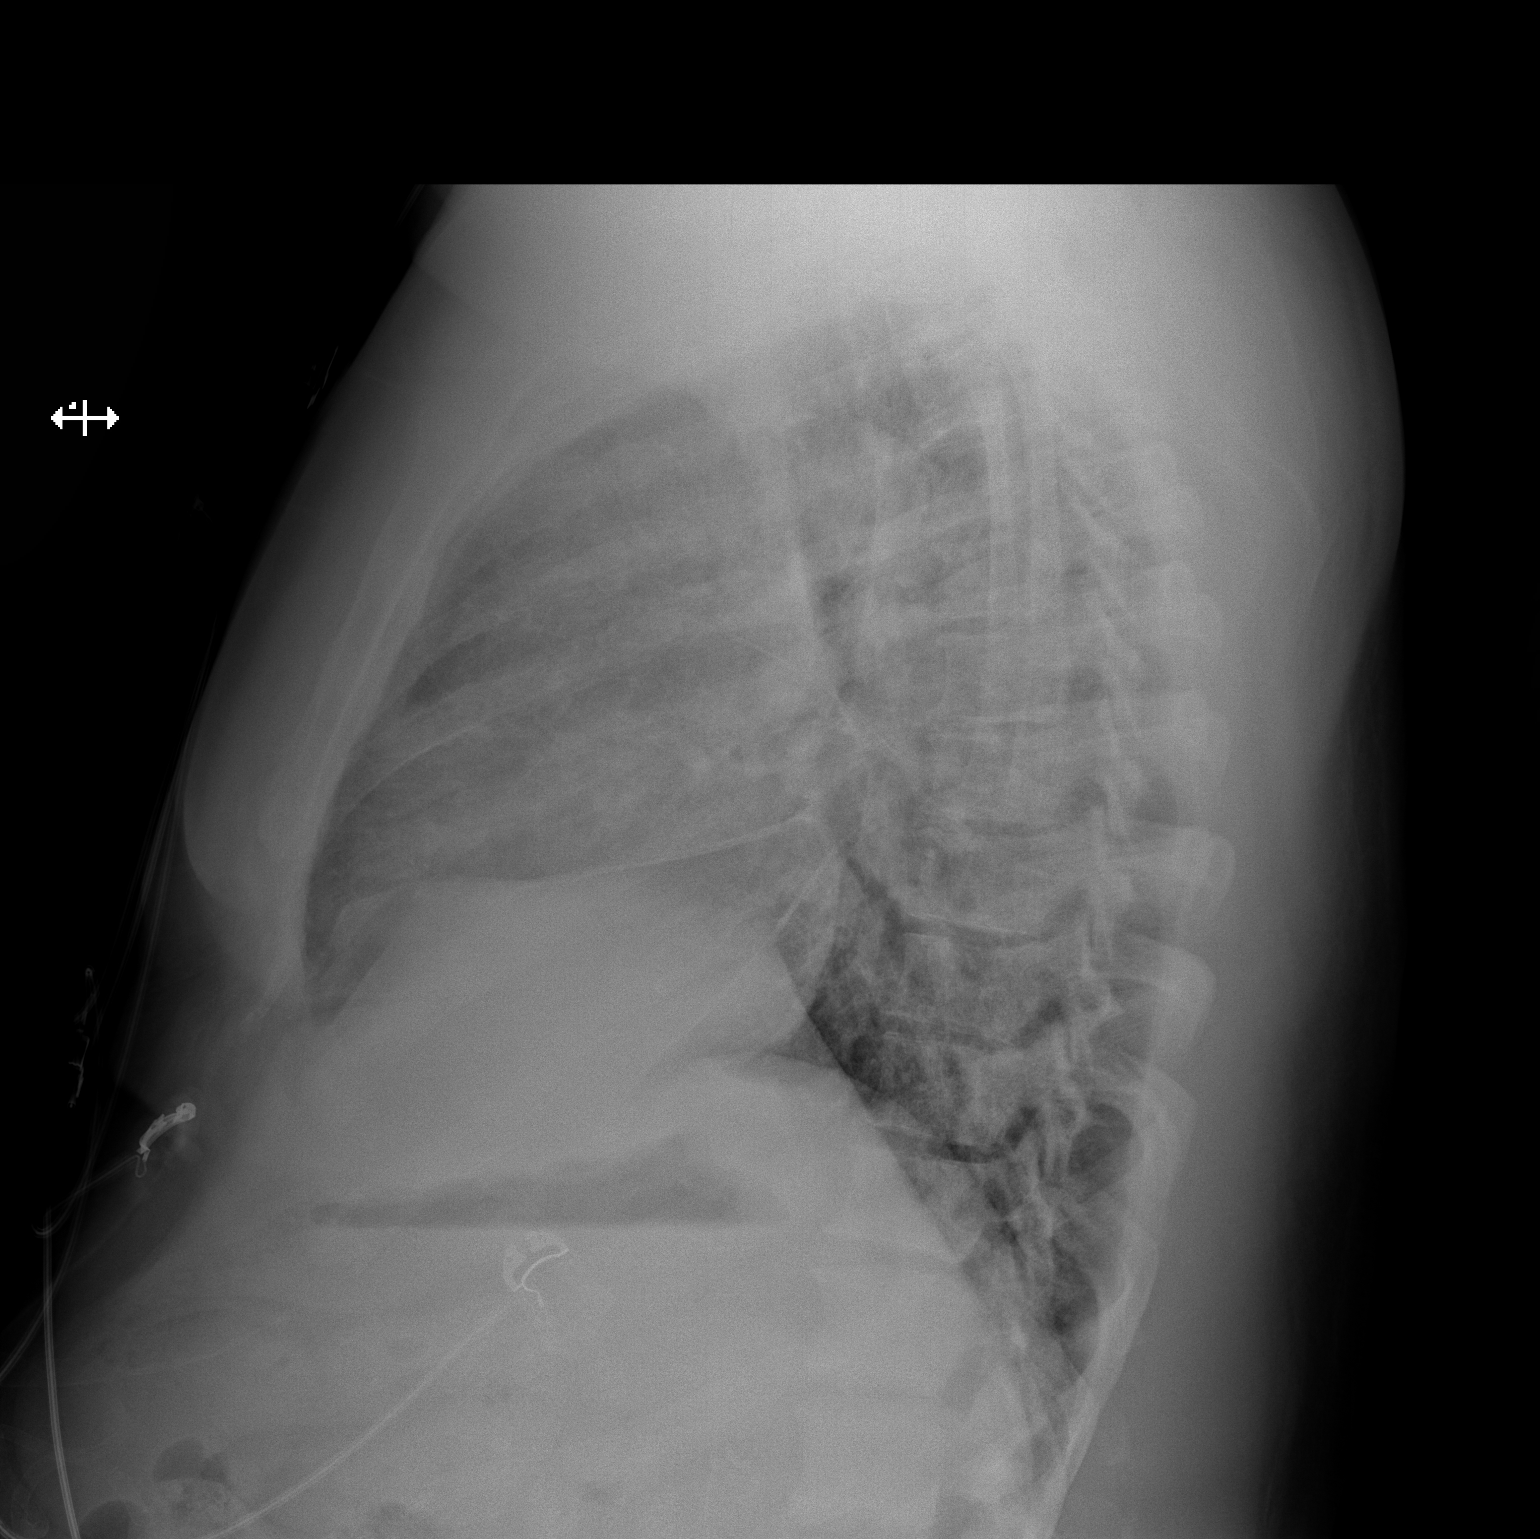

[2 of 2 positions shown; findings below may reference images not displayed]

FINDINGS: Stable cardiac silhouette given projection and technique. Reticular
opacities of the lungs. No focal consolidation. No pleural effusion
or pneumothorax identified. Bones are unremarkable.
IMPRESSION: Reticular opacities of lungs probably representing interstitial
pulmonary edema.

By: Akariel Med C M.D.

## 2018-09-04 ENCOUNTER — Telehealth: Payer: Self-pay | Admitting: Emergency Medicine

## 2018-09-04 ENCOUNTER — Inpatient Hospital Stay (HOSPITAL_COMMUNITY): Payer: Medicare Other

## 2018-09-04 ENCOUNTER — Telehealth: Payer: Self-pay

## 2018-09-04 DIAGNOSIS — D631 Anemia in chronic kidney disease: Secondary | ICD-10-CM | POA: Diagnosis not present

## 2018-09-04 DIAGNOSIS — N2581 Secondary hyperparathyroidism of renal origin: Secondary | ICD-10-CM | POA: Diagnosis not present

## 2018-09-04 DIAGNOSIS — N186 End stage renal disease: Secondary | ICD-10-CM | POA: Diagnosis not present

## 2018-09-04 NOTE — Telephone Encounter (Signed)
Howard Bell with [facility] would like verbal orders for 2 x week for 8 weeks for wound care for Left lower leg and Left Foot.  CB# is [phone].  Please advise.  Thank you.

## 2018-09-04 NOTE — Telephone Encounter (Signed)
Orders were called in with verbal okay per Dr Sharol Given.

## 2018-09-04 NOTE — Telephone Encounter (Signed)
Copied from [place] [id number]. Topic: General - Other >> Sep 04, 2018 11:27 AM Rainey Pines A wrote: [place] from [facility] [phone] would like a callback from provider or nurse to confirm if patient is on oxygen.

## 2018-09-05 ENCOUNTER — Encounter: Payer: Self-pay | Admitting: Orthopedic Surgery

## 2018-09-05 ENCOUNTER — Ambulatory Visit (INDEPENDENT_AMBULATORY_CARE_PROVIDER_SITE_OTHER): Payer: Medicare Other | Admitting: Orthopedic Surgery

## 2018-09-05 ENCOUNTER — Ambulatory Visit (HOSPITAL_BASED_OUTPATIENT_CLINIC_OR_DEPARTMENT_OTHER)
Admission: RE | Admit: 2018-09-05 | Discharge: 2018-09-05 | Disposition: A | Discharge status: Home / Self Care | Payer: Medicare Other | Source: Ambulatory Visit | Attending: Cardiovascular Disease | Admitting: Cardiovascular Disease

## 2018-09-05 ENCOUNTER — Telehealth: Payer: Self-pay | Admitting: Emergency Medicine

## 2018-09-05 ENCOUNTER — Other Ambulatory Visit: Payer: Self-pay

## 2018-09-05 VITALS — Ht 72.0 in | Wt 205.2 lb

## 2018-09-05 DIAGNOSIS — I509 Heart failure, unspecified: Secondary | ICD-10-CM | POA: Insufficient documentation

## 2018-09-05 DIAGNOSIS — L02612 Cutaneous abscess of left foot: Secondary | ICD-10-CM | POA: Diagnosis not present

## 2018-09-05 DIAGNOSIS — N186 End stage renal disease: Secondary | ICD-10-CM | POA: Diagnosis not present

## 2018-09-05 DIAGNOSIS — I110 Hypertensive heart disease with heart failure: Secondary | ICD-10-CM | POA: Insufficient documentation

## 2018-09-05 DIAGNOSIS — I519 Heart disease, unspecified: Secondary | ICD-10-CM

## 2018-09-05 DIAGNOSIS — Z89611 Acquired absence of right leg above knee: Secondary | ICD-10-CM | POA: Diagnosis not present

## 2018-09-05 DIAGNOSIS — Z992 Dependence on renal dialysis: Secondary | ICD-10-CM

## 2018-09-05 DIAGNOSIS — I080 Rheumatic disorders of both mitral and aortic valves: Secondary | ICD-10-CM | POA: Insufficient documentation

## 2018-09-05 DIAGNOSIS — E119 Type 2 diabetes mellitus without complications: Secondary | ICD-10-CM | POA: Insufficient documentation

## 2018-09-05 DIAGNOSIS — I70262 Atherosclerosis of native arteries of extremities with gangrene, left leg: Secondary | ICD-10-CM

## 2018-09-05 DIAGNOSIS — R079 Chest pain, unspecified: Secondary | ICD-10-CM | POA: Insufficient documentation

## 2018-09-05 DIAGNOSIS — S78111A Complete traumatic amputation at level between right hip and knee, initial encounter: Secondary | ICD-10-CM

## 2018-09-05 NOTE — Progress Notes (Signed)
  Echocardiogram 2D Echocardiogram has been performed.  Howard Bell 09/05/2018, 12:08 PM

## 2018-09-05 NOTE — Telephone Encounter (Signed)
Copied from [place] [id number]. Topic: General - Inquiry >> Sep 04, 2018  4:04 PM Jeri Cos wrote: Reason for CRM: Ariel called needing a copy of the office note or a prescription for oxygen for the pt.  Call back number is [phone]. Fax number is [phone].

## 2018-09-05 NOTE — Telephone Encounter (Signed)
Please send message to clinical pool and find out who ordered this oxygen for this patient.  I know patient was recently discharged from the hospital.  Thanks.

## 2018-09-05 NOTE — Progress Notes (Signed)
Office Visit Note   Patient: Howard Bell           Date of Birth: 10-May-1965           MRN: [id number] Visit Date: 09/05/2018              Requested by: Horald Pollen, MD [place],  [place] [address] PCP: Horald Pollen, MD  Chief Complaint  Patient presents with  . Right Leg - Routine Post Op      HPI: Patient is a 53 year old gentleman who presents with 2 separate issues #1 he is status post a right above-knee amputation #2 progressive gangrenous changes of the left leg.  Patient is currently at home with family to provide care for him he was at [facility] for [facility] months.  Patient does dialysis Monday Wednesday Friday.  Assessment & Plan: Visit Diagnoses:  1. Cutaneous abscess of left foot   2. ESRD on dialysis ([place])   3. Unilateral AKA, right ([place])   4. Atherosclerosis of native arteries of extremities with gangrene, left leg ([place])     Plan: We will plan for a left above-the-knee amputation with return to home plan for surgery on Friday with dialysis on Saturday.  Anticipate 5 days of hospital stay.  Patient's family will need a [place] lift and they were provided a prescription for a motorized wheelchair with anticipated discharge to home after surgery.  Follow-Up Instructions: Return in about 2 weeks (around 09/19/2018).   Ortho Exam  Patient is alert, oriented, no adenopathy, well-dressed, normal affect, normal respiratory effort. Examination patient has a well-healing right above-the-knee amputation.  Sutures are harvested today.  Examination the left foot patient has progressive dry gangrenous changes of the left lower extremity.  Patient has complete full-thickness dry gangrenous changes involving the distal third of the calf the heel and the Achilles as well as the foot.  Imaging: No results found.   Labs: Lab Results  Component Value Date   HGBA1C 7.5 (H) 08/17/2018   HGBA1C 7.8 (H) 08/15/2018   HGBA1C 8.3 (H) 05/12/2018   ESRSEDRATE >140 (H) 08/15/2018   CRP 20.5 (H) 08/15/2018   REPTSTATUS 08/20/2018 FINAL 08/15/2018   CULT  08/15/2018    NO GROWTH 5 DAYS Performed at [facility], [place] [address], [place], [place] [address]    LABORGA PROTEUS MIRABILIS 05/12/2018     Lab Results  Component Value Date   ALBUMIN 2.3 (L) 08/30/2018   ALBUMIN 2.3 (L) 08/28/2018   ALBUMIN 2.2 (L) 08/23/2018   PREALBUMIN 14.6 (L) 08/15/2018    Body mass index is 27.84 kg/m.  Orders:  No orders of the defined types were placed in this encounter.  No orders of the defined types were placed in this encounter.    Procedures: No procedures performed  Clinical Data: No additional findings.  ROS:  All other systems negative, except as noted in the HPI. Review of Systems  Objective: Vital Signs: Ht 6' (1.829 m)   Wt 205 lb 4 oz (93.1 kg)   BMI 27.84 kg/m   Specialty Comments:  No specialty comments available.  PMFS History: Patient Active Problem List   Diagnosis Date Noted  . Atherosclerosis of native arteries of extremities with gangrene, left leg ([place]) 09/05/2018  . Hypoxia   . ESRD (end stage renal disease) ([place])   . Labile blood glucose   . Postoperative pain   . Dry gangrene (HCC)--left foot/left shin   . SIRS (systemic inflammatory  response syndrome) ([place])   . Poorly controlled type 2 diabetes mellitus with peripheral neuropathy ([place])   . Peripheral arterial disease ([place])   . Unilateral AKA, right ([place]) 08/17/2018  . Sepsis ([place]) 08/15/2018  . Acute metabolic encephalopathy 69/67/8938  . Gangrene of left foot ([place])   . Hypoglycemia   . Drug induced constipation   . Confusion, postoperative   . Labile blood pressure   . Uncontrolled diabetes mellitus type 2 with peripheral artery disease ([place])   . Anemia of chronic disease   . Diabetes mellitus type 2 in nonobese (HCC)   . Supplemental oxygen dependent   . Unilateral AKA, left ([place]) 08/07/2018  . S/P BKA (below knee amputation),  right ([place]) 08/04/2018  . Severe protein-calorie malnutrition ([place]) 08/01/2018  . Dehiscence of amputation stump ([place]) 08/01/2018  . Cutaneous abscess of left foot 08/01/2018  . Cellulitis of right lower extremity   . Influenza A 05/12/2018  . Pneumonia 05/12/2018  . Gangrene of right foot ([place])   . Critical limb ischemia with history of revascularization of same extremity 05/09/2018  . LUQ pain   . 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 ([place]) 11/16/2017  . CKD stage 5 due to type 2 diabetes mellitus ([place]) 11/16/2017  . Glaucoma due to type 2 diabetes mellitus ([place]) 11/16/2017  . Chronic generalized pain 11/16/2017  . Recurrent pneumonia 11/06/2017  . Diabetic gastroparesis ([place]) 11/06/2017  . Generalized abdominal pain 09/13/2017  . Acute pain of right shoulder 07/26/2017  . Acute bursitis of right shoulder 07/26/2017  . Left arm swelling 03/28/2017  . ESRD on dialysis ([place]) 03/28/2017  . Chest pain 11/26/2016  . Essential hypertension 11/26/2016  . Anemia due to end stage renal disease ([place]) 08/04/2016  . Acute respiratory failure with hypoxemia ([place]) 08/04/2016  . Type 2 diabetes mellitus with foot ulcer ([place]) 08/04/2016  . Acute CHF (congestive heart failure) ([place]) 08/04/2016  . Elevated troponin    Past Medical History:  Diagnosis Date  . Arthritis   . Congestive heart failure (CHF) ([place])   . Depression   . 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 Diabetic gastroparesis ([place]) 11/06/2017  . ESRD (end stage renal disease) on dialysis ([place])   . H/O seasonal allergies   . Hypertension   . Pneumonia   . Sleep apnea     Family History  Problem Relation Age of Onset  . Diabetes Mother   . Hypertension Father     Past Surgical History:  Procedure Laterality Date  . AMPUTATION Right 05/14/2018   Procedure:  AMPUTATION BELOW KNEE;  Surgeon: Newt Minion, MD;  Location: [facility];  Service: Orthopedics;  Laterality: Right;  . AMPUTATION Right 08/04/2018   Procedure: RIGHT ABOVE KNEE AMPUTATION;  Surgeon: Newt Minion, MD;  Location: [facility];  Service: Orthopedics;  Laterality: Right;  . ESOPHAGOGASTRODUODENOSCOPY (EGD) WITH PROPOFOL N/A 12/05/2017   Procedure: ESOPHAGOGASTRODUODENOSCOPY (EGD) WITH PROPOFOL;  Surgeon: Doran Stabler, MD;  Location: [facility];  Service: Gastroenterology;  Laterality: N/A;  . HERNIA REPAIR    . IR AV DIALY SHUNT INTRO NEEDLE/INTRACATH INITIAL W/PTA/IMG LEFT  03/30/2017   Social History   Occupational History  . Not on file  Tobacco Use  . Smoking status: Never Smoker  . Smokeless tobacco: Never Used  Substance and Sexual Activity  . Alcohol use:  No  . Drug use: No  . Sexual activity: Not on file

## 2018-09-05 NOTE — Telephone Encounter (Signed)
ariel w/ [facility] calling to ask for the proof the pt is on O2.  They need the proof today so they can get the oxygen out to the pt today  cb # [phone]   Fax number: [phone]

## 2018-09-06 DIAGNOSIS — D631 Anemia in chronic kidney disease: Secondary | ICD-10-CM | POA: Diagnosis not present

## 2018-09-06 DIAGNOSIS — N2581 Secondary hyperparathyroidism of renal origin: Secondary | ICD-10-CM | POA: Diagnosis not present

## 2018-09-06 DIAGNOSIS — N186 End stage renal disease: Secondary | ICD-10-CM | POA: Diagnosis not present

## 2018-09-06 NOTE — Progress Notes (Signed)
Voicemail message left for Howard Bell to schedule Covid 19 screen prior to procedure 6/19.

## 2018-09-06 NOTE — Telephone Encounter (Signed)
Ariel called again to check on the office notes or Rx for Oxygen and if the Pt is on Oxygen/ please advise

## 2018-09-07 ENCOUNTER — Encounter (HOSPITAL_COMMUNITY): Payer: Self-pay | Admitting: *Deleted

## 2018-09-07 ENCOUNTER — Other Ambulatory Visit: Payer: Self-pay

## 2018-09-07 DIAGNOSIS — E1151 Type 2 diabetes mellitus with diabetic peripheral angiopathy without gangrene: Secondary | ICD-10-CM | POA: Diagnosis not present

## 2018-09-07 DIAGNOSIS — L97528 Non-pressure chronic ulcer of other part of left foot with other specified severity: Secondary | ICD-10-CM | POA: Diagnosis not present

## 2018-09-07 DIAGNOSIS — E1122 Type 2 diabetes mellitus with diabetic chronic kidney disease: Secondary | ICD-10-CM | POA: Diagnosis not present

## 2018-09-07 DIAGNOSIS — T8743 Infection of amputation stump, right lower extremity: Secondary | ICD-10-CM | POA: Diagnosis not present

## 2018-09-07 DIAGNOSIS — I872 Venous insufficiency (chronic) (peripheral): Secondary | ICD-10-CM | POA: Diagnosis not present

## 2018-09-07 DIAGNOSIS — L97828 Non-pressure chronic ulcer of other part of left lower leg with other specified severity: Secondary | ICD-10-CM | POA: Diagnosis not present

## 2018-09-07 NOTE — Progress Notes (Signed)
Spoke with pt and then his wife, Howard Bell for pre-op call. Pt has hx of CHF, Dr. Gwenlyn Found is his cardiologist. Last office visit was 05/09/18. Pt denies any recent chest pain or sob. Pt is a type 2 diabetic. Last A1C was 7.5 on 08/17/18. [name] states pt's fasting blood sugar is usually around 160. Instructed her to have pt check his blood sugar in the AM when he gets up and every 2 hours until he leaves for the hospital. If blood sugar is >220 take 1/2 of usual correction dose of Admelog insulin. If blood sugar is 70 or below, treat with 1/2 cup of clear juice (apple or cranberry) and recheck blood sugar 15 minutes after drinking juice. If blood sugar continues to be 70 or below, call the Short Stay department and ask to speak to a nurse. [name] voiced understanding.  ERAS diet with G2 ordered by Dr. Sharol Given. Howard Bell will come over now and pick up the G2. Instructed pt and wife that he is not to eat food after midnight tonight, but may have clear liquids until 8:30 AM. Also instructed them that pt is to drink the G2 by 8:30 AM. They voiced understanding.   Pt will need Covid testing when he arrives.   Coronavirus Screening  Have you experienced the following symptoms:  Cough NO Fever (>100.66F) NO  Runny nose NO Sore throat NO Difficulty breathing/shortness of breath NO  Have you or a family member traveled in the last 14 days and where? NO    Patient and wife reminded that hospital visitation restrictions are in effect and the importance of the restrictions.

## 2018-09-07 NOTE — Telephone Encounter (Signed)
Spoke with Ariel and informed her that this pt will be [facility] on 09/14/2018 with provider so they would have to give [place] a call after the 25th for report, she verbalized understanding.

## 2018-09-07 NOTE — Progress Notes (Signed)
Spoke to Mr Howard Bell this morning. He is unable to come for Covid screening today. Will be screened DOS and plans to arrive at [facility]

## 2018-09-08 ENCOUNTER — Other Ambulatory Visit: Payer: Self-pay

## 2018-09-08 ENCOUNTER — Inpatient Hospital Stay (HOSPITAL_COMMUNITY): Payer: Medicare Other | Admitting: Anesthesiology

## 2018-09-08 ENCOUNTER — Inpatient Hospital Stay (HOSPITAL_COMMUNITY): Payer: Medicare Other

## 2018-09-08 ENCOUNTER — Inpatient Hospital Stay (HOSPITAL_COMMUNITY)
Admission: RE | Admit: 2018-09-08 | Discharge: 2018-09-16 | DRG: 239 | Disposition: A | Discharge status: Rehabilitation Facility | Payer: Medicare Other | Attending: Orthopedic Surgery | Admitting: Orthopedic Surgery

## 2018-09-08 ENCOUNTER — Encounter (HOSPITAL_COMMUNITY): Payer: Self-pay

## 2018-09-08 ENCOUNTER — Encounter (HOSPITAL_COMMUNITY)
Admission: RE | Disposition: A | Discharge status: Rehabilitation Facility | Payer: Self-pay | Source: Home / Self Care | Attending: Orthopedic Surgery

## 2018-09-08 DIAGNOSIS — I5023 Acute on chronic systolic (congestive) heart failure: Secondary | ICD-10-CM | POA: Diagnosis not present

## 2018-09-08 DIAGNOSIS — G9341 Metabolic encephalopathy: Secondary | ICD-10-CM | POA: Diagnosis not present

## 2018-09-08 DIAGNOSIS — K219 Gastro-esophageal reflux disease without esophagitis: Secondary | ICD-10-CM | POA: Diagnosis present

## 2018-09-08 DIAGNOSIS — E8889 Other specified metabolic disorders: Secondary | ICD-10-CM | POA: Diagnosis present

## 2018-09-08 DIAGNOSIS — L97529 Non-pressure chronic ulcer of other part of left foot with unspecified severity: Secondary | ICD-10-CM | POA: Diagnosis not present

## 2018-09-08 DIAGNOSIS — Z20828 Contact with and (suspected) exposure to other viral communicable diseases: Secondary | ICD-10-CM | POA: Diagnosis present

## 2018-09-08 DIAGNOSIS — I248 Other forms of acute ischemic heart disease: Secondary | ICD-10-CM | POA: Diagnosis not present

## 2018-09-08 DIAGNOSIS — I739 Peripheral vascular disease, unspecified: Secondary | ICD-10-CM | POA: Diagnosis not present

## 2018-09-08 DIAGNOSIS — R0689 Other abnormalities of breathing: Secondary | ICD-10-CM | POA: Diagnosis not present

## 2018-09-08 DIAGNOSIS — J9622 Acute and chronic respiratory failure with hypercapnia: Secondary | ICD-10-CM | POA: Diagnosis not present

## 2018-09-08 DIAGNOSIS — D638 Anemia in other chronic diseases classified elsewhere: Secondary | ICD-10-CM | POA: Diagnosis present

## 2018-09-08 DIAGNOSIS — G4730 Sleep apnea, unspecified: Secondary | ICD-10-CM | POA: Diagnosis present

## 2018-09-08 DIAGNOSIS — E1169 Type 2 diabetes mellitus with other specified complication: Secondary | ICD-10-CM | POA: Diagnosis not present

## 2018-09-08 DIAGNOSIS — K3184 Gastroparesis: Secondary | ICD-10-CM | POA: Diagnosis present

## 2018-09-08 DIAGNOSIS — E1122 Type 2 diabetes mellitus with diabetic chronic kidney disease: Secondary | ICD-10-CM | POA: Diagnosis not present

## 2018-09-08 DIAGNOSIS — N186 End stage renal disease: Secondary | ICD-10-CM | POA: Diagnosis present

## 2018-09-08 DIAGNOSIS — Z4781 Encounter for orthopedic aftercare following surgical amputation: Secondary | ICD-10-CM | POA: Diagnosis not present

## 2018-09-08 DIAGNOSIS — J9621 Acute and chronic respiratory failure with hypoxia: Secondary | ICD-10-CM | POA: Diagnosis not present

## 2018-09-08 DIAGNOSIS — Z89612 Acquired absence of left leg above knee: Secondary | ICD-10-CM

## 2018-09-08 DIAGNOSIS — Z978 Presence of other specified devices: Secondary | ICD-10-CM | POA: Diagnosis not present

## 2018-09-08 DIAGNOSIS — Z992 Dependence on renal dialysis: Secondary | ICD-10-CM | POA: Diagnosis not present

## 2018-09-08 DIAGNOSIS — Z89611 Acquired absence of right leg above knee: Secondary | ICD-10-CM

## 2018-09-08 DIAGNOSIS — I132 Hypertensive heart and chronic kidney disease with heart failure and with stage 5 chronic kidney disease, or end stage renal disease: Secondary | ICD-10-CM | POA: Diagnosis present

## 2018-09-08 DIAGNOSIS — Z79899 Other long term (current) drug therapy: Secondary | ICD-10-CM

## 2018-09-08 DIAGNOSIS — G4733 Obstructive sleep apnea (adult) (pediatric): Secondary | ICD-10-CM | POA: Diagnosis present

## 2018-09-08 DIAGNOSIS — E1143 Type 2 diabetes mellitus with diabetic autonomic (poly)neuropathy: Secondary | ICD-10-CM | POA: Diagnosis present

## 2018-09-08 DIAGNOSIS — I959 Hypotension, unspecified: Secondary | ICD-10-CM | POA: Diagnosis not present

## 2018-09-08 DIAGNOSIS — Z833 Family history of diabetes mellitus: Secondary | ICD-10-CM

## 2018-09-08 DIAGNOSIS — I120 Hypertensive chronic kidney disease with stage 5 chronic kidney disease or end stage renal disease: Secondary | ICD-10-CM | POA: Diagnosis not present

## 2018-09-08 DIAGNOSIS — I96 Gangrene, not elsewhere classified: Secondary | ICD-10-CM

## 2018-09-08 DIAGNOSIS — N2581 Secondary hyperparathyroidism of renal origin: Secondary | ICD-10-CM | POA: Diagnosis not present

## 2018-09-08 DIAGNOSIS — N185 Chronic kidney disease, stage 5: Secondary | ICD-10-CM | POA: Diagnosis not present

## 2018-09-08 DIAGNOSIS — D62 Acute posthemorrhagic anemia: Secondary | ICD-10-CM | POA: Diagnosis not present

## 2018-09-08 DIAGNOSIS — R918 Other nonspecific abnormal finding of lung field: Secondary | ICD-10-CM | POA: Diagnosis not present

## 2018-09-08 DIAGNOSIS — E1152 Type 2 diabetes mellitus with diabetic peripheral angiopathy with gangrene: Secondary | ICD-10-CM | POA: Diagnosis not present

## 2018-09-08 DIAGNOSIS — J9602 Acute respiratory failure with hypercapnia: Secondary | ICD-10-CM

## 2018-09-08 DIAGNOSIS — J302 Other seasonal allergic rhinitis: Secondary | ICD-10-CM | POA: Diagnosis present

## 2018-09-08 DIAGNOSIS — J9811 Atelectasis: Secondary | ICD-10-CM | POA: Diagnosis not present

## 2018-09-08 DIAGNOSIS — I70262 Atherosclerosis of native arteries of extremities with gangrene, left leg: Secondary | ICD-10-CM | POA: Diagnosis not present

## 2018-09-08 DIAGNOSIS — L8990 Pressure ulcer of unspecified site, unspecified stage: Secondary | ICD-10-CM | POA: Insufficient documentation

## 2018-09-08 DIAGNOSIS — Z794 Long term (current) use of insulin: Secondary | ICD-10-CM

## 2018-09-08 DIAGNOSIS — E669 Obesity, unspecified: Secondary | ICD-10-CM | POA: Diagnosis not present

## 2018-09-08 DIAGNOSIS — Z8249 Family history of ischemic heart disease and other diseases of the circulatory system: Secondary | ICD-10-CM

## 2018-09-08 DIAGNOSIS — F329 Major depressive disorder, single episode, unspecified: Secondary | ICD-10-CM | POA: Diagnosis present

## 2018-09-08 DIAGNOSIS — I10 Essential (primary) hypertension: Secondary | ICD-10-CM | POA: Diagnosis not present

## 2018-09-08 DIAGNOSIS — S78112A Complete traumatic amputation at level between left hip and knee, initial encounter: Secondary | ICD-10-CM | POA: Diagnosis not present

## 2018-09-08 DIAGNOSIS — D631 Anemia in chronic kidney disease: Secondary | ICD-10-CM | POA: Diagnosis not present

## 2018-09-08 DIAGNOSIS — Z22322 Carrier or suspected carrier of Methicillin resistant Staphylococcus aureus: Secondary | ICD-10-CM

## 2018-09-08 DIAGNOSIS — Z9981 Dependence on supplemental oxygen: Secondary | ICD-10-CM

## 2018-09-08 HISTORY — PX: APPLICATION OF WOUND VAC: SHX5189

## 2018-09-08 HISTORY — PX: AMPUTATION: SHX166

## 2018-09-08 LAB — CBC WITH DIFFERENTIAL/PLATELET
Abs Immature Granulocytes: 0.08 10*3/uL — ABNORMAL HIGH (ref 0.00–0.07)
Basophils Absolute: 0 10*3/uL (ref 0.0–0.1)
Basophils Relative: 1 %
Eosinophils Absolute: 0.2 10*3/uL (ref 0.0–0.5)
Eosinophils Relative: 2 %
HCT: 24.8 % — ABNORMAL LOW (ref 39.0–52.0)
Hemoglobin: 7.6 g/dL — ABNORMAL LOW (ref 13.0–17.0)
Immature Granulocytes: 1 %
Lymphocytes Relative: 13 %
Lymphs Abs: 1.1 10*3/uL (ref 0.7–4.0)
MCH: 30.3 pg (ref 26.0–34.0)
MCHC: 30.6 g/dL (ref 30.0–36.0)
MCV: 98.8 fL (ref 80.0–100.0)
Monocytes Absolute: 0.6 10*3/uL (ref 0.1–1.0)
Monocytes Relative: 7 %
Neutro Abs: 6.6 10*3/uL (ref 1.7–7.7)
Neutrophils Relative %: 76 %
Platelets: 201 10*3/uL (ref 150–400)
RBC: 2.51 MIL/uL — ABNORMAL LOW (ref 4.22–5.81)
RDW: 19.5 % — ABNORMAL HIGH (ref 11.5–15.5)
WBC: 8.6 10*3/uL (ref 4.0–10.5)
nRBC: 0 % (ref 0.0–0.2)

## 2018-09-08 LAB — SARS CORONAVIRUS 2 BY RT PCR (HOSPITAL ORDER, PERFORMED IN ~~LOC~~ HOSPITAL LAB): SARS Coronavirus 2: NEGATIVE

## 2018-09-08 LAB — POCT I-STAT 7, (LYTES, BLD GAS, ICA,H+H)
Acid-base deficit: 4 mmol/L — ABNORMAL HIGH (ref 0.0–2.0)
Bicarbonate: 25.5 mmol/L (ref 20.0–28.0)
Calcium, Ion: 1.15 mmol/L (ref 1.15–1.40)
HCT: 28 % — ABNORMAL LOW (ref 39.0–52.0)
Hemoglobin: 9.5 g/dL — ABNORMAL LOW (ref 13.0–17.0)
O2 Saturation: 100 %
Patient temperature: 36
Potassium: 4.1 mmol/L (ref 3.5–5.1)
Sodium: 137 mmol/L (ref 135–145)
TCO2: 28 mmol/L (ref 22–32)
pCO2 arterial: 68.6 mmHg (ref 32.0–48.0)
pH, Arterial: 7.173 — CL (ref 7.350–7.450)
pO2, Arterial: 308 mmHg — ABNORMAL HIGH (ref 83.0–108.0)

## 2018-09-08 LAB — TROPONIN I
Troponin I: 0.04 ng/mL (ref <0.03)
Troponin I: 0.06 ng/mL (ref <0.03)

## 2018-09-08 LAB — POCT I-STAT 4, (NA,K, GLUC, HGB,HCT)
Glucose, Bld: 182 mg/dL — ABNORMAL HIGH (ref 70–99)
HCT: 32 % — ABNORMAL LOW (ref 39.0–52.0)
Hemoglobin: 10.9 g/dL — ABNORMAL LOW (ref 13.0–17.0)
Potassium: 4.2 mmol/L (ref 3.5–5.1)
Sodium: 137 mmol/L (ref 135–145)

## 2018-09-08 LAB — ABO/RH: ABO/RH(D): A POS

## 2018-09-08 LAB — BASIC METABOLIC PANEL
Anion gap: 15 (ref 5–15)
BUN: 52 mg/dL — ABNORMAL HIGH (ref 6–20)
CO2: 23 mmol/L (ref 22–32)
Calcium: 7.9 mg/dL — ABNORMAL LOW (ref 8.9–10.3)
Chloride: 100 mmol/L (ref 98–111)
Creatinine, Ser: 10.12 mg/dL — ABNORMAL HIGH (ref 0.61–1.24)
GFR calc Af Amer: 6 mL/min — ABNORMAL LOW (ref >60)
GFR calc non Af Amer: 5 mL/min — ABNORMAL LOW (ref >60)
Glucose, Bld: 205 mg/dL — ABNORMAL HIGH (ref 70–99)
Potassium: 3.7 mmol/L (ref 3.5–5.1)
Sodium: 138 mmol/L (ref 135–145)

## 2018-09-08 LAB — MRSA PCR SCREENING: MRSA by PCR: POSITIVE — AB

## 2018-09-08 LAB — MAGNESIUM: Magnesium: 2.1 mg/dL (ref 1.7–2.4)

## 2018-09-08 LAB — TYPE AND SCREEN
ABO/RH(D): A POS
Antibody Screen: NEGATIVE

## 2018-09-08 LAB — GLUCOSE, CAPILLARY
Glucose-Capillary: 107 mg/dL — ABNORMAL HIGH (ref 70–99)
Glucose-Capillary: 160 mg/dL — ABNORMAL HIGH (ref 70–99)
Glucose-Capillary: 180 mg/dL — ABNORMAL HIGH (ref 70–99)
Glucose-Capillary: 194 mg/dL — ABNORMAL HIGH (ref 70–99)
Glucose-Capillary: 89 mg/dL (ref 70–99)

## 2018-09-08 LAB — PHOSPHORUS: Phosphorus: 5.4 mg/dL — ABNORMAL HIGH (ref 2.5–4.6)

## 2018-09-08 SURGERY — AMPUTATION, ABOVE KNEE
Anesthesia: General | Site: Leg Upper | Laterality: Left

## 2018-09-08 MED ORDER — ENSURE PRE-SURGERY PO LIQD
296.0000 mL | Freq: Once | ORAL | Status: DC
  Filled 2018-09-08: qty 296

## 2018-09-08 MED ORDER — CHLORHEXIDINE GLUCONATE 0.12% ORAL RINSE (MEDLINE KIT)
15.0000 mL | Freq: Two times a day (BID) | OROMUCOSAL | Status: DC
  Administered 2018-09-08 – 2018-09-16 (×10): 15 mL via OROMUCOSAL

## 2018-09-08 MED ORDER — VASOPRESSIN 20 UNIT/ML IV SOLN
0.0100 [IU]/min | INTRAVENOUS | Status: DC
  Filled 2018-09-08: qty 2

## 2018-09-08 MED ORDER — HYDRALAZINE HCL 20 MG/ML IJ SOLN
10.0000 mg | INTRAMUSCULAR | Status: DC | PRN
  Administered 2018-09-08 – 2018-09-09 (×2): 10 mg via INTRAVENOUS
  Filled 2018-09-08 (×2): qty 1

## 2018-09-08 MED ORDER — LIDOCAINE 2% (20 MG/ML) 5 ML SYRINGE
INTRAMUSCULAR | Status: DC | PRN
  Administered 2018-09-08: 60 mg via INTRAVENOUS

## 2018-09-08 MED ORDER — SODIUM CHLORIDE 0.9 % IV SOLN
INTRAVENOUS | Status: DC | PRN
  Administered 2018-09-08: 50 ug/min via INTRAVENOUS

## 2018-09-08 MED ORDER — PROPOFOL 500 MG/50ML IV EMUL
INTRAVENOUS | Status: DC | PRN
  Administered 2018-09-08: 50 ug/kg/min via INTRAVENOUS

## 2018-09-08 MED ORDER — EPHEDRINE 5 MG/ML INJ
INTRAVENOUS | Status: AC
  Filled 2018-09-08: qty 10

## 2018-09-08 MED ORDER — NOREPINEPHRINE 4 MG/250ML-% IV SOLN
0.0000 ug/min | INTRAVENOUS | Status: DC
  Filled 2018-09-08: qty 250

## 2018-09-08 MED ORDER — BISACODYL 10 MG RE SUPP: 10.0000 mg | Freq: Every day | RECTAL | Status: DC | PRN

## 2018-09-08 MED ORDER — SODIUM CHLORIDE 0.9 % IV SOLN
62.5000 mg | Freq: Once | INTRAVENOUS | Status: AC
  Administered 2018-09-09: 62.5 mg via INTRAVENOUS
  Filled 2018-09-08 (×2): qty 5

## 2018-09-08 MED ORDER — ESMOLOL HCL 100 MG/10ML IV SOLN
INTRAVENOUS | Status: DC | PRN
  Administered 2018-09-08 (×2): 20 ug via INTRAVENOUS

## 2018-09-08 MED ORDER — LIDOCAINE 2% (20 MG/ML) 5 ML SYRINGE
INTRAMUSCULAR | Status: AC
  Filled 2018-09-08: qty 10

## 2018-09-08 MED ORDER — ONDANSETRON HCL 4 MG PO TABS: 4.0000 mg | ORAL_TABLET | Freq: Four times a day (QID) | ORAL | Status: DC | PRN

## 2018-09-08 MED ORDER — FENTANYL CITRATE (PF) 100 MCG/2ML IJ SOLN
25.0000 ug | INTRAMUSCULAR | Status: DC | PRN
  Administered 2018-09-08: 50 ug via INTRAVENOUS
  Administered 2018-09-08: 25 ug via INTRAVENOUS
  Administered 2018-09-09 (×2): 50 ug via INTRAVENOUS
  Administered 2018-09-09: 04:00:00 75 ug via INTRAVENOUS
  Administered 2018-09-09 (×2): 50 ug via INTRAVENOUS
  Administered 2018-09-09 (×2): 75 ug via INTRAVENOUS
  Filled 2018-09-08 (×8): qty 2

## 2018-09-08 MED ORDER — FENTANYL CITRATE (PF) 100 MCG/2ML IJ SOLN
INTRAMUSCULAR | Status: AC
  Filled 2018-09-08: qty 2

## 2018-09-08 MED ORDER — 0.9 % SODIUM CHLORIDE (POUR BTL) OPTIME
TOPICAL | Status: DC | PRN
  Administered 2018-09-08: 1000 mL

## 2018-09-08 MED ORDER — SODIUM CHLORIDE 0.9 % IV SOLN
62.5000 mg | INTRAVENOUS | Status: DC
  Administered 2018-09-13: 62.5 mg via INTRAVENOUS
  Filled 2018-09-08 (×2): qty 5

## 2018-09-08 MED ORDER — FENTANYL CITRATE (PF) 100 MCG/2ML IJ SOLN
INTRAMUSCULAR | Status: DC | PRN
  Administered 2018-09-08: 25 ug via INTRAVENOUS
  Administered 2018-09-08: 50 ug via INTRAVENOUS
  Administered 2018-09-08: 25 ug via INTRAVENOUS

## 2018-09-08 MED ORDER — SODIUM CHLORIDE 0.9 % IV SOLN: INTRAVENOUS | Status: DC | PRN

## 2018-09-08 MED ORDER — ORAL CARE MOUTH RINSE
15.0000 mL | OROMUCOSAL | Status: DC
  Administered 2018-09-08 – 2018-09-09 (×10): 15 mL via OROMUCOSAL

## 2018-09-08 MED ORDER — NALOXONE HCL 0.4 MG/ML IJ SOLN
INTRAMUSCULAR | Status: DC | PRN
  Administered 2018-09-08 (×2): 0.4 mg via INTRAVENOUS

## 2018-09-08 MED ORDER — SODIUM CHLORIDE 0.9 % IV SOLN
INTRAVENOUS | Status: DC | PRN
  Administered 2018-09-08 (×2): via INTRAVENOUS

## 2018-09-08 MED ORDER — ENOXAPARIN SODIUM 30 MG/0.3ML ~~LOC~~ SOLN: 30.0000 mg | SUBCUTANEOUS | Status: DC

## 2018-09-08 MED ORDER — DOCUSATE SODIUM 100 MG PO CAPS
100.0000 mg | ORAL_CAPSULE | Freq: Two times a day (BID) | ORAL | Status: DC
  Administered 2018-09-09 – 2018-09-16 (×14): 100 mg via ORAL
  Filled 2018-09-08 (×15): qty 1

## 2018-09-08 MED ORDER — MIDAZOLAM HCL 2 MG/2ML IJ SOLN
INTRAMUSCULAR | Status: AC
  Filled 2018-09-08: qty 2

## 2018-09-08 MED ORDER — FENTANYL CITRATE (PF) 100 MCG/2ML IJ SOLN
25.0000 ug | INTRAMUSCULAR | Status: DC | PRN
  Administered 2018-09-08: 25 ug via INTRAVENOUS

## 2018-09-08 MED ORDER — FENTANYL CITRATE (PF) 250 MCG/5ML IJ SOLN
INTRAMUSCULAR | Status: AC
  Filled 2018-09-08: qty 5

## 2018-09-08 MED ORDER — CHLORHEXIDINE GLUCONATE 4 % EX LIQD: 60.0000 mL | Freq: Once | CUTANEOUS | Status: DC

## 2018-09-08 MED ORDER — PROPOFOL 1000 MG/100ML IV EMUL
0.0000 ug/kg/min | INTRAVENOUS | Status: DC
  Administered 2018-09-08: 30 ug/kg/min via INTRAVENOUS
  Administered 2018-09-08: 35 ug/kg/min via INTRAVENOUS
  Administered 2018-09-09: 30 ug/kg/min via INTRAVENOUS
  Filled 2018-09-08 (×4): qty 100

## 2018-09-08 MED ORDER — ACETAMINOPHEN 500 MG PO TABS
1000.0000 mg | ORAL_TABLET | Freq: Once | ORAL | Status: AC
  Administered 2018-09-08: 1000 mg via ORAL
  Filled 2018-09-08: qty 2

## 2018-09-08 MED ORDER — INSULIN ASPART 100 UNIT/ML ~~LOC~~ SOLN
0.0000 [IU] | SUBCUTANEOUS | Status: DC
  Administered 2018-09-08: 2 [IU] via SUBCUTANEOUS
  Administered 2018-09-09: 1 [IU] via SUBCUTANEOUS

## 2018-09-08 MED ORDER — CEFAZOLIN SODIUM-DEXTROSE 2-4 GM/100ML-% IV SOLN
2.0000 g | INTRAVENOUS | Status: AC
  Administered 2018-09-08: 2 g via INTRAVENOUS
  Filled 2018-09-08: qty 100

## 2018-09-08 MED ORDER — PHENYLEPHRINE HCL (PRESSORS) 10 MG/ML IV SOLN
INTRAVENOUS | Status: DC | PRN
  Administered 2018-09-08: 120 ug via INTRAVENOUS
  Administered 2018-09-08: 80 ug via INTRAVENOUS

## 2018-09-08 MED ORDER — PROPOFOL 10 MG/ML IV BOLUS
INTRAVENOUS | Status: DC | PRN
  Administered 2018-09-08: 130 mg via INTRAVENOUS

## 2018-09-08 MED ORDER — ONDANSETRON HCL 4 MG/2ML IJ SOLN: 4.0000 mg | Freq: Four times a day (QID) | INTRAMUSCULAR | Status: DC | PRN

## 2018-09-08 MED ORDER — CHLORHEXIDINE GLUCONATE CLOTH 2 % EX PADS
6.0000 | MEDICATED_PAD | Freq: Every day | CUTANEOUS | Status: DC
  Administered 2018-09-09 – 2018-09-12 (×5): 6 via TOPICAL

## 2018-09-08 MED ORDER — IPRATROPIUM-ALBUTEROL 0.5-2.5 (3) MG/3ML IN SOLN: 3.0000 mL | RESPIRATORY_TRACT | Status: DC | PRN

## 2018-09-08 MED ORDER — PANTOPRAZOLE SODIUM 40 MG IV SOLR
40.0000 mg | Freq: Every day | INTRAVENOUS | Status: DC
  Administered 2018-09-08 – 2018-09-10 (×3): 40 mg via INTRAVENOUS
  Filled 2018-09-08 (×3): qty 40

## 2018-09-08 MED ORDER — ALBUTEROL SULFATE (2.5 MG/3ML) 0.083% IN NEBU: 2.5000 mg | INHALATION_SOLUTION | RESPIRATORY_TRACT | Status: DC | PRN

## 2018-09-08 MED ORDER — SODIUM CHLORIDE 0.9 % IV SOLN
INTRAVENOUS | Status: DC
  Administered 2018-09-08: 10:00:00 via INTRAVENOUS

## 2018-09-08 MED ORDER — POVIDONE-IODINE 10 % EX SWAB
2.0000 "application " | Freq: Once | CUTANEOUS | Status: AC
  Administered 2018-09-08: 2 via TOPICAL

## 2018-09-08 SURGICAL SUPPLY — 43 items
BLADE SAW RECIP 87.9 MT (BLADE) ×2 IMPLANT
BLADE SURG 21 STRL SS (BLADE) ×2 IMPLANT
BNDG COHESIVE 6X5 TAN STRL LF (GAUZE/BANDAGES/DRESSINGS) ×2 IMPLANT
CANISTER WOUND CARE 500ML ATS (WOUND CARE) ×2 IMPLANT
COVER SURGICAL LIGHT HANDLE (MISCELLANEOUS) ×2 IMPLANT
COVER WAND RF STERILE (DRAPES) IMPLANT
CUFF TOURNIQUET SINGLE 34IN LL (TOURNIQUET CUFF) IMPLANT
DRAPE INCISE IOBAN 66X45 STRL (DRAPES) ×4 IMPLANT
DRAPE U-SHAPE 47X51 STRL (DRAPES) ×2 IMPLANT
DRESSING PREVENA PLUS CUSTOM (GAUZE/BANDAGES/DRESSINGS) ×1 IMPLANT
DRSG PREVENA PLUS CUSTOM (GAUZE/BANDAGES/DRESSINGS) ×2
DURAPREP 26ML APPLICATOR (WOUND CARE) ×2 IMPLANT
ELECT REM PT RETURN 9FT ADLT (ELECTROSURGICAL) ×2
ELECTRODE REM PT RTRN 9FT ADLT (ELECTROSURGICAL) ×1 IMPLANT
GLOVE BIOGEL PI IND STRL 7.5 (GLOVE) ×1 IMPLANT
GLOVE BIOGEL PI IND STRL 9 (GLOVE) ×1 IMPLANT
GLOVE BIOGEL PI INDICATOR 7.5 (GLOVE) ×1
GLOVE BIOGEL PI INDICATOR 9 (GLOVE) ×1
GLOVE SURG ORTHO 9.0 STRL STRW (GLOVE) ×2 IMPLANT
GLOVE SURG SS PI 6.5 STRL IVOR (GLOVE) ×2 IMPLANT
GOWN STRL REUS W/ TWL LRG LVL3 (GOWN DISPOSABLE) ×1 IMPLANT
GOWN STRL REUS W/ TWL XL LVL3 (GOWN DISPOSABLE) ×2 IMPLANT
GOWN STRL REUS W/TWL LRG LVL3 (GOWN DISPOSABLE) ×1
GOWN STRL REUS W/TWL XL LVL3 (GOWN DISPOSABLE) ×2
KIT BASIN OR (CUSTOM PROCEDURE TRAY) ×2 IMPLANT
KIT TURNOVER KIT B (KITS) ×2 IMPLANT
MANIFOLD NEPTUNE II (INSTRUMENTS) ×2 IMPLANT
NS IRRIG 1000ML POUR BTL (IV SOLUTION) ×2 IMPLANT
PACK ORTHO EXTREMITY (CUSTOM PROCEDURE TRAY) ×2 IMPLANT
PAD ARMBOARD 7.5X6 YLW CONV (MISCELLANEOUS) ×2 IMPLANT
PREVENA RESTOR ARTHOFORM 46X30 (CANNISTER) ×4 IMPLANT
STAPLER VISISTAT 35W (STAPLE) IMPLANT
STOCKINETTE IMPERVIOUS LG (DRAPES) IMPLANT
SUT ETHILON 2 0 PSLX (SUTURE) ×4 IMPLANT
SUT SILK 2 0 (SUTURE) ×1
SUT SILK 2-0 18XBRD TIE 12 (SUTURE) ×1 IMPLANT
SUT VIC AB 0 CT1 27 (SUTURE) ×1
SUT VIC AB 0 CT1 27XBRD ANBCTR (SUTURE) ×1 IMPLANT
SUT VIC AB 1 CTX 36 (SUTURE) ×2
SUT VIC AB 1 CTX36XBRD ANBCTR (SUTURE) ×2 IMPLANT
TOWEL GREEN STERILE FF (TOWEL DISPOSABLE) ×2 IMPLANT
TUBE CONNECTING 20X1/4 (TUBING) ×2 IMPLANT
YANKAUER SUCT BULB TIP NO VENT (SUCTIONS) ×2 IMPLANT

## 2018-09-08 NOTE — Consult Note (Signed)
[facility] Renal Consultation Note    Indication for Consultation:  Management of ESRD/hemodialysis; anemia, hypertension/volume and secondary hyperparathyroidism  HPI: Howard Bell is a 53 y.o. male with ESRD on MWF HD at [place] with hx DM, HTN, OSA PAD s/p recent right AKA [id number]  and today left AKA.  During last admission he was discharged on home O2.  He was d/c home 6/10 after a near month long hospitalization and rehab stay.  His last HD was 6/17. He nearly attained his EDW even with cutting his time short by one hour. He presented to day for left AKA due to progressive gangrenous changes and per H & P was stable prior to surgery. Towards the end of the case he became hypoxic and hypotensive requiring brief pressors and intubation. BP now in usual range. CXR shows no excessive fluid. Hgb down 7.6.  Awaiting BMP results.  Istat K was 4.1  Trop 0.04 ABG pH 7.17 pO2 308 p CO2 68.6 He is currently sedated, intubated on mechanical ventilation.   Past Medical History:  Diagnosis Date  . Arthritis   . Congestive heart failure (CHF) ([place])   . Depression   . 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 Diabetic gastroparesis ([place]) 11/06/2017  . ESRD (end stage renal disease) on dialysis [facility])    M,W.F dialysis  . H/O seasonal allergies   . Hypertension   . Pneumonia   . Sleep apnea    not using it now, on continuous O2 2 L during the day, 3 L during the night   Past Surgical History:  Procedure Laterality Date  . AMPUTATION Right 05/14/2018   Procedure: AMPUTATION BELOW KNEE;  Surgeon: Newt Minion, MD;  Location: [place];  Service: Orthopedics;  Laterality: Right;  . AMPUTATION Right 08/04/2018   Procedure: RIGHT ABOVE KNEE AMPUTATION;  Surgeon: Newt Minion, MD;  Location: [place];  Service: Orthopedics;  Laterality: Right;  . ESOPHAGOGASTRODUODENOSCOPY (EGD) WITH PROPOFOL N/A 12/05/2017   Procedure: ESOPHAGOGASTRODUODENOSCOPY (EGD) WITH  PROPOFOL;  Surgeon: Doran Stabler, MD;  [facility];  Service: Gastroenterology;  Laterality: N/A;  . HERNIA REPAIR    . IR AV DIALY SHUNT INTRO NEEDLE/INTRACATH INITIAL W/PTA/IMG LEFT  03/30/2017   Family History  Problem Relation Age of Onset  . Diabetes Mother   . Hypertension Father    Social History:  reports that he has never smoked. He has never used smokeless tobacco. He reports that he does not drink alcohol or use drugs. No Known Allergies Prior to Admission medications   Medication Sig Start Date End Date Taking? Authorizing Provider  acetaminophen (TYLENOL) 325 MG tablet Take 1-2 tablets (325-650 mg total) by mouth every 4 (four) hours as needed for mild pain. 08/24/18  Yes Love, Ivan Anchors, PA-C  albuterol (PROVENTIL HFA;VENTOLIN HFA) 108 (90 Base) MCG/ACT inhaler Inhale 1 puff into the lungs every 4 (four) hours as needed for wheezing or shortness of breath.    Yes [provider]  brimonidine (ALPHAGAN) 0.2 % ophthalmic solution Place 1 drop into both eyes 2 (two) times daily.   Yes [provider]  calcium acetate (PHOSLO) 667 MG capsule Take 2 capsules (1,334 mg total) by mouth 3 (three) times daily with meals. 08/30/18  Yes Love, Ivan Anchors, PA-C  docusate sodium (COLACE) 100 MG capsule Take 1 capsule (100 mg total) by mouth 2 (two) times daily. 08/30/18  Yes Love, Ivan Anchors, PA-C  fluticasone [facility])  50 MCG/ACT nasal spray Place 1 spray into both nostrils daily.   Yes [provider]  insulin lispro (ADMELOG SOLOSTAR) 100 UNIT/ML KwikPen Inject 1-9 Units into the skin 3 (three) times daily. If bs is 121-150=1 unit, 151-200=2 units, 201-250=3 units, 251-300=5 units, 301-350=7 units, 351-400=9 units   Yes [provider]  latanoprost (XALATAN) 0.005 % ophthalmic solution Place 1 drop into both eyes at bedtime.   Yes [provider]  methocarbamol (ROBAXIN) 500 MG tablet Take 0.5 tablets (250 mg total) by mouth 2 (two) times  daily as needed for muscle spasms. Patient taking differently: Take 500 mg by mouth 2 (two) times daily as needed for muscle spasms.  08/30/18  Yes Love, Ivan Anchors, PA-C  metoCLOPramide (REGLAN) 5 MG tablet Take 1 tablet (5 mg total) by mouth 3 (three) times daily before meals. 08/30/18  Yes Love, Ivan Anchors, PA-C  metoprolol succinate (TOPROL-XL) 50 MG 24 hr tablet Take 1 tablet (50 mg total) by mouth at bedtime. Take with a meal. 08/30/18  Yes Love, Ivan Anchors, PA-C  omeprazole (PRILOSEC) 20 MG capsule Take 1 capsule (20 mg total) by mouth at bedtime. 08/30/18  Yes Love, Ivan Anchors, PA-C  oxyCODONE (OXY IR/ROXICODONE) 5 MG immediate release tablet Take 1 tablet (5 mg total) by mouth every 6 (six) hours as needed for severe pain. 08/30/18  Yes Love, Ivan Anchors, PA-C  sevelamer carbonate (RENVELA) 800 MG tablet Take 1 tablet (800 mg total) by mouth 3 (three) times daily with meals. 08/30/18  Yes Love, Ivan Anchors, PA-C  silver sulfADIAZINE (SILVADENE) 1 % cream Apply topically daily. 08/30/18  Yes Love, Ivan Anchors, PA-C  timolol (BETIMOL) 0.5 % ophthalmic solution Place 1 drop into both eyes 2 (two) times daily. 05/07/17  Yes Ivar Drape D, PA   Current Facility-Administered Medications  Medication Dose Route Frequency Provider Last Rate Last Dose  . 0.9 %  sodium chloride infusion   Intravenous PRN Newt Minion, MD 10 mL/hr at 09/08/18 1400    . albuterol (PROVENTIL) (2.5 MG/3ML) 0.083% nebulizer solution 2.5 mg  2.5 mg Nebulization Q4H PRN Jennelle Human B, NP      . bisacodyl (DULCOLAX) suppository 10 mg  10 mg Rectal Daily PRN Jennelle Human B, NP      . chlorhexidine gluconate (MEDLINE KIT) (PERIDEX) 0.12 % solution 15 mL  15 mL Mouth Rinse BID Jennelle Human B, NP      . docusate sodium (COLACE) capsule 100 mg  100 mg Oral BID Rayburn, Neta Mends, PA-C      . fentaNYL (SUBLIMAZE) injection 25 mcg  25 mcg Intravenous Q15 min PRN Jennelle Human B, NP   25 mcg at 09/08/18 1420  . fentaNYL (SUBLIMAZE)  injection 25-100 mcg  25-100 mcg Intravenous Q30 min PRN Jennelle Human B, NP      . hydrALAZINE (APRESOLINE) injection 10 mg  10 mg Intravenous Q4H PRN Jennelle Human B, NP   10 mg at 09/08/18 1517  . insulin aspart (novoLOG) injection 0-9 Units  0-9 Units Subcutaneous Q4H Jennelle Human B, NP   2 Units at 09/08/18 1519  . MEDLINE mouth rinse  15 mL Mouth Rinse 10 times per day Jennelle Human B, NP   15 mL at 09/08/18 1400  . ondansetron (ZOFRAN) tablet 4 mg  4 mg Oral Q6H PRN Rayburn, Neta Mends, PA-C       Or  . ondansetron (ZOFRAN) injection 4 mg  4 mg Intravenous Q6H PRN Rayburn, Neta Mends,  PA-C      . pantoprazole (PROTONIX) injection 40 mg  40 mg Intravenous Daily Jennelle Human B, NP   40 mg at 09/08/18 1459  . propofol (DIPRIVAN) 1000 MG/100ML infusion  0-50 mcg/kg/min Intravenous Continuous Jennelle Human B, NP 16.76 mL/hr at 09/08/18 1345 30 mcg/kg/min at 09/08/18 1345   Labs: Basic Metabolic Panel: Recent Labs  Lab 09/08/18 0934 09/08/18 1301  NA 137 137  K 4.2 4.1  GLUCOSE 182*  --    CBC: Recent Labs  Lab 09/08/18 0934 09/08/18 1301 09/08/18 1440  WBC  --   --  8.6  NEUTROABS  --   --  6.6  HGB 10.9* 9.5* 7.6*  HCT 32.0* 28.0* 24.8*  MCV  --   --  98.8  PLT  --   --  201   CBG: Recent Labs  Lab 09/08/18 0827 09/08/18 1047 09/08/18 1441  GLUCAP 180* 160* 194*    ROS: As per HPI information obtained from prior notes in Epic   Physical Exam: Vitals:   09/08/18 0824 09/08/18 1332 09/08/18 1336  BP: 125/64  107/65  Pulse: 78  75  Resp: 20  16  Temp: 98.5 F (36.9 C)    SpO2: 92% 100% 100%     General: WDWN on vent Head: NCAT sclera not icteric  Lungs: CTA bilaterally without wheezes, rales, or rhonchi. anteriorly Heart: RRR with S1 S2.  Abdomen: soft NT + BS Lower extremities: left AKA with wound vac - right AKA with staples slight crusting mild edema Neuro: sedated Dialysis Access: left AVGG + bruit and thrill  Dialysis Orders: AF  MWF 4.25 hours 450/800 EDW  93 2 K 2.25 Ca profile heparin 9000 Left AVGG Parsabiv 10  hgb 8 at d/c 6/10 d/c on home O2  iPTH 640 6/17 had been off Parsabiv - during recent hospitalization Mircera 200 given 6/16 - tsat after last hosp d/c 37%   Assessment/Plan: 1. S/o left AKA 6/19 and recent (5/15) AKA hold heparin for now 2. Acute hypoxic respiratory failure/ OSA ^ pCO2 on home and nocturnal O2 requiring vent post surgery - CXR showed need for retraction of ETT from prox right mainstem bronchus with bibasilar atx or hazy infiltrate similar to 6/3 - RT just make correction 3. ESRD - MWF - last HD Wednesday istat K 4.1 - will add on BMP to labs drawn earlier. As long as K ok - no acute need for HD today - plan for Saturday unless labs suggest otherwise.-will need a lower heparin dose at d/c due to amputation- holding heparin for now 4. Hypertension/volume  --post hypotension - back to baseline now. Given 1.5 L IVF - volume on exam ok no acute need for HD today. 5.  Anemia  - hgb down post op 7.6 was 8.6 6/17 at d/c not due for ESA - received 200 Mircera 6/16 - add weekly dose of Fe - likely some ABLA - transfuse prn -  6.  Metabolic bone disease -  Holding parsabiv while here due to unavailability - resume binders when eating - not on VDRA presumably due to high corr Ca 7.  Nutrition - chronic low alb - TF if prolonged intubation 8.  DM - per primary  Myriam Jacobson, PA-C [place] [phone] 09/08/2018, 3:24 PM

## 2018-09-08 NOTE — Anesthesia Procedure Notes (Signed)
Procedure Name: Intubation Date/Time: 09/08/2018 12:39 PM Performed by: Neldon Newport, CRNA Pre-anesthesia Checklist: Patient identified, Emergency Drugs available, Suction available and Patient being monitored Patient Re-evaluated:Patient Re-evaluated prior to induction Oxygen Delivery Method: Circle System Utilized Preoxygenation: Pre-oxygenation with 100% oxygen Induction Type: IV induction Ventilation: Mask ventilation without difficulty Laryngoscope Size: Mac and 4 Grade View: Grade I Tube type: Oral Number of attempts: 1 Airway Equipment and Method: Stylet and Oral airway Placement Confirmation: ETT inserted through vocal cords under direct vision,  positive ETCO2 and breath sounds checked- equal and bilateral Secured at: 23 cm Tube secured with: Tape Dental Injury: Teeth and Oropharynx as per pre-operative assessment

## 2018-09-08 NOTE — Progress Notes (Signed)
[facility] Progress Note Patient Name: Howard Bell DOB: 27-Aug-1965 MRN: [id number]   Date of Service  09/08/2018  HPI/Events of Note  Elevated Troponin at 2:40 PM. Request to cycle Troponins.   eICU Interventions  Will order: 1. Continue to cycle Troponin.     Intervention Category Major Interventions: Other:  Lysle Dingwall 09/08/2018, 9:47 PM

## 2018-09-08 NOTE — Anesthesia Preprocedure Evaluation (Signed)
Anesthesia Evaluation  Patient identified by MRN, date of birth, ID band Patient awake    Reviewed: Allergy & Precautions, NPO status , Patient's Chart, lab work & pertinent test results  Airway Mallampati: I  TM Distance: >3 FB Neck ROM: Full    Dental no notable dental hx. (+) Teeth Intact, Dental Advisory Given   Pulmonary sleep apnea (on 2L 02 day, 3L at night) ,    Pulmonary exam normal breath sounds clear to auscultation       Cardiovascular hypertension, Normal cardiovascular exam Rhythm:Regular Rate:Normal  TTE 2020 EF 60-65%, valves normal   Neuro/Psych PSYCHIATRIC DISORDERS Depression    GI/Hepatic   Endo/Other  diabetes, Type 2, Insulin Dependent  Renal/GU ESRF and DialysisRenal disease (dialysis MWF via left arm AVF)     Musculoskeletal  (+) Arthritis ,   Abdominal   Peds  Hematology  (+) Blood dyscrasia (Hgb 10.9), anemia ,   Anesthesia Other Findings   Reproductive/Obstetrics                             Anesthesia Physical Anesthesia Plan  ASA: III  Anesthesia Plan: General   Post-op Pain Management:    Induction: Intravenous  PONV Risk Score and Plan: 2 and Ondansetron and Midazolam  Airway Management Planned: LMA  Additional Equipment:   Intra-op Plan:   Post-operative Plan: Extubation in OR  Informed Consent: I have reviewed the patients History and Physical, chart, labs and discussed the procedure including the risks, benefits and alternatives for the proposed anesthesia with the patient or authorized representative who has indicated his/her understanding and acceptance.     Dental advisory given  Plan Discussed with: CRNA  Anesthesia Plan Comments:         Anesthesia Quick Evaluation

## 2018-09-08 NOTE — Consult Note (Signed)
NAME:  Howard Bell, MRN:  [id number], DOB:  February 11, 1966, LOS: 0 ADMISSION DATE:  09/08/2018, CONSULTATION DATE:  09/08/2018 REFERRING MD:  Dr. Sharol Given, CHIEF COMPLAINT:  Respiratory insufficiency    Brief History   53 year old male admitted for elective left AKA for failed medical management of LLE gangrene.  Surgery was without complication under MAC anesthesia but at the end of OR case, became hypoxic and hypotensive requiring brief vasopressor support and intubation to be transferred to ICU and remain on mechanical ventilation.   History of present illness   HPI obtained from medical chart review as patient is sedated and intubated on mechanical ventilation.   53 year old male with history of OSA and chronic hypoxic respiratory failure on 2L O2, and 3L at night, ESRD - HD dependent MWF, PAD s/p R BKA 05/14/2018, HTN, DMT2, anemia, diabetic gastroparesis,  And depression admitted 6/19 to Dr. Sharol Given for elective left AKA after failed medical management of gangrene to left lower leg and foot.    Reportedly last had dialysis on 09/06/2018.  Plans were for patient to have dialysis next on Saturday 6/20.  Patient was taken to the OR and initially anesthesia was under LMA.  Surgery was without complication with estimated blood loss of 200 ml with wound vac applied.  Anesthesia was reversed however patient did not wake up, then became reportedly hypoxic then hypotensive requiring intermittent vasopressor support and endotracheal intubation.  Patient to be transferred to ICU on mechanical ventilation for further support and medical care.   Past Medical History  OSA/ chronic respiratory failure on baseline 2L O2 and 3L at night, ESRD MWF dialysis dependent, PAD s/p R BKA 05/14/2018, HTN, DMT2, anemia, diabetic gastroparesis, depression  Significant Hospital Events   6/19 Admitted/ OR  Consults:   Procedures:  6/19 L AKA  6/19 ETT >> 6/19 right radial aline >>  Significant Diagnostic Tests:  6/16  TTE >> EF 60-65%, LV impaired diastolic relaxation.  No wall abnormalities.  Moderately elevated RVSP 52.3.    Micro Data:  6/19 SARS coronavirus2 >> negative  Antimicrobials:  6/19 ancef   Interim history/subjective:  Arrived on propofol 50 mcg/kg.  No pressors.    Objective   Blood pressure 107/65, pulse 75, temperature 98.5 F (36.9 C), resp. rate 16, SpO2 100 %.    Vent Mode: PRVC FiO2 (%):  [60 %-100 %] 60 % Set Rate:  [16 bmp] 16 bmp Vt Set:  [550 mL] 550 mL PEEP:  [5 cmH20] 5 cmH20 Plateau Pressure:  [24 cmH20] 24 cmH20   Intake/Output Summary (Last 24 hours) at 09/08/2018 1424 Last data filed at 09/08/2018 1348 Gross per 24 hour  Intake 1450 ml  Output 200 ml  Net 1250 ml   There were no vitals filed for this visit.  Examination: General:  Well nourished adult male in NAD on MV HEENT: MM pink/moist, ETT at 23 cm, pupils 4/round/ sluggish, anicteric  Neuro: propofol held, will wake up and f/c, MAE, coughs frequently CV: rr- SR in 70's, no murmur PULM: even/non-labored, lungs bilaterally clear bilaterally, placed on PSV 15/5 and patient had insufficient tidal volumes around 200 GI: soft, non-tender, bs active  Extremities: warm/dry, LUE AVF +b/t, L AKA with wound vac in place, previous R AKA with staples intact, approximated, no drainage/ healing site  Skin: no rashes   Resolved Hospital Problem list    Assessment & Plan:   Acute respiratory insufficiency post procedure Chronic hypoxic respiratory failure/ OSA on 2L O2/  3L at night - never smoker P:  Full MV support, PRVC 8 cc/kg, rate 18- ABG reviewed and ok Wean FiO2 for goal sats 92-99% CXR now and trend  VAP bundle Albuterol PRN Daily SBT, currently failed SBT when propofol held PAD protocol with propofol and prn fentanyl for RASS goal 0/-1   LLE/ foot gangrene s/p Left AKA Healing R AKA for prior gangrene P:  Per Dr. Sharol Given Ongoing wound vac Defer abx to ortho Defer VTE ppx till ok w/ ortho   Hypotension- resolved currently and tolerating propofol Hx of HTN, PAD/ PVD - had essentially normal TTE 6/16 with normal EF 60-65% - less likely related to infection, had normal WBC/ has been afebrile P:  Tele monitoring Continue aline for now, if stable ongoing can d/c Aline later Minimal EBL but will check CBC now Check troponin/ EKG Send empiric BC  ESRD on MWF - last iHD Wednesday 6/17 - AVF LUE P:  Renal panel/ mag now Consult Nephrology   DM P:  CBG q 4 SSI sensitive   Anemia P:  Check CBC T&S   Best practice:  Diet: NPO Pain/Anxiety/Delirium protocol (if indicated): propofol / prn fentanyl VAP protocol (if indicated): yes DVT prophylaxis: per ortho  GI prophylaxis: PPI Glucose control: SSI sensitive Mobility: BR Code Status: full  Family Communication: wife updated by ortho Disposition: ICU  Labs   CBC: Recent Labs  Lab 09/08/18 0934 09/08/18 1301  HGB 10.9* 9.5*  HCT 32.0* 28.0*    Basic Metabolic Panel: Recent Labs  Lab 09/08/18 0934 09/08/18 1301  NA 137 137  K 4.2 4.1  GLUCOSE 182*  --    GFR: Estimated Creatinine Clearance: 9.3 mL/min (A) (by C-G formula based on SCr of 10.31 mg/dL (H)). No results for input(s): PROCALCITON, WBC, LATICACIDVEN in the last 168 hours.  Liver Function Tests: No results for input(s): AST, ALT, ALKPHOS, BILITOT, PROT, ALBUMIN in the last 168 hours. No results for input(s): LIPASE, AMYLASE in the last 168 hours. No results for input(s): AMMONIA in the last 168 hours.  ABG    Component Value Date/Time   PHART 7.173 (LL) 09/08/2018 1301   PCO2ART 68.6 (HH) 09/08/2018 1301   PO2ART 308.0 (H) 09/08/2018 1301   HCO3 25.5 09/08/2018 1301   TCO2 28 09/08/2018 1301   ACIDBASEDEF 4.0 (H) 09/08/2018 1301   O2SAT 100.0 09/08/2018 1301     Coagulation Profile: No results for input(s): INR, PROTIME in the last 168 hours.  Cardiac Enzymes: No results for input(s): CKTOTAL, CKMB, CKMBINDEX, TROPONINI in the  last 168 hours.  HbA1C: Hgb A1c MFr Bld  Date/Time Value Ref Range Status  08/17/2018 04:16 AM 7.5 (H) 4.8 - 5.6 % Final    Comment:    (NOTE) Pre diabetes:          5.7%-6.4% Diabetes:              >6.4% Glycemic control for   <7.0% adults with diabetes   08/15/2018 09:01 PM 7.8 (H) 4.8 - 5.6 % Final    Comment:    (NOTE) Pre diabetes:          5.7%-6.4% Diabetes:              >6.4% Glycemic control for   <7.0% adults with diabetes     CBG: Recent Labs  Lab 09/08/18 0827 09/08/18 1047  GLUCAP 180* 160*    Review of Systems:   Unable to assess as patient is sedated/ intubated  Past  Medical History  He,  has a past medical history of Arthritis, Congestive heart failure (CHF) ([place]), Depression, Diabetes mellitus without complication ([place]), Diabetic gastroparesis ([place]) (11/06/2017), ESRD (end stage renal disease) on dialysis [facility]), H/O seasonal allergies, Hypertension, Pneumonia, and Sleep apnea.   Surgical History    Past Surgical History:  Procedure Laterality Date  . AMPUTATION Right 05/14/2018   Procedure: AMPUTATION BELOW KNEE;  Surgeon: Newt Minion, MD;  Location: [place];  Service: Orthopedics;  Laterality: Right;  . AMPUTATION Right 08/04/2018   Procedure: RIGHT ABOVE KNEE AMPUTATION;  Surgeon: Newt Minion, MD;  Location: [place];  Service: Orthopedics;  Laterality: Right;  . ESOPHAGOGASTRODUODENOSCOPY (EGD) WITH PROPOFOL N/A 12/05/2017   Procedure: ESOPHAGOGASTRODUODENOSCOPY (EGD) WITH PROPOFOL;  Surgeon: Doran Stabler, MD;  [facility];  Service: Gastroenterology;  Laterality: N/A;  . HERNIA REPAIR    . IR AV DIALY SHUNT INTRO NEEDLE/INTRACATH INITIAL W/PTA/IMG LEFT  03/30/2017     Social History   reports that he has never smoked. He has never used smokeless tobacco. He reports that he does not drink alcohol or use drugs.   Family History   His family history includes Diabetes in his mother; Hypertension in his father.   Allergies No Known  Allergies   Home Medications  Prior to Admission medications   Medication Sig Start Date End Date Taking? Authorizing Provider  acetaminophen (TYLENOL) 325 MG tablet Take 1-2 tablets (325-650 mg total) by mouth every 4 (four) hours as needed for mild pain. 08/24/18  Yes Love, Ivan Anchors, PA-C  albuterol (PROVENTIL HFA;VENTOLIN HFA) 108 (90 Base) MCG/ACT inhaler Inhale 1 puff into the lungs every 4 (four) hours as needed for wheezing or shortness of breath.    Yes [provider]  brimonidine (ALPHAGAN) 0.2 % ophthalmic solution Place 1 drop into both eyes 2 (two) times daily.   Yes [provider]  calcium acetate (PHOSLO) 667 MG capsule Take 2 capsules (1,334 mg total) by mouth 3 (three) times daily with meals. 08/30/18  Yes Love, Ivan Anchors, PA-C  docusate sodium (COLACE) 100 MG capsule Take 1 capsule (100 mg total) by mouth 2 (two) times daily. 08/30/18  Yes Love, Ivan Anchors, PA-C  fluticasone (FLONASE) 50 MCG/ACT nasal spray Place 1 spray into both nostrils daily.   Yes [provider]  insulin lispro (ADMELOG SOLOSTAR) 100 UNIT/ML KwikPen Inject 1-9 Units into the skin 3 (three) times daily. If bs is 121-150=1 unit, 151-200=2 units, 201-250=3 units, 251-300=5 units, 301-350=7 units, 351-400=9 units   Yes [provider]  latanoprost (XALATAN) 0.005 % ophthalmic solution Place 1 drop into both eyes at bedtime.   Yes [provider]  methocarbamol (ROBAXIN) 500 MG tablet Take 0.5 tablets (250 mg total) by mouth 2 (two) times daily as needed for muscle spasms. Patient taking differently: Take 500 mg by mouth 2 (two) times daily as needed for muscle spasms.  08/30/18  Yes Love, Ivan Anchors, PA-C  metoCLOPramide (REGLAN) 5 MG tablet Take 1 tablet (5 mg total) by mouth 3 (three) times daily before meals. 08/30/18  Yes Love, Ivan Anchors, PA-C  metoprolol succinate (TOPROL-XL) 50 MG 24 hr tablet Take 1 tablet (50 mg total) by mouth at bedtime. Take with a meal. 08/30/18  Yes  Love, Ivan Anchors, PA-C  omeprazole (PRILOSEC) 20 MG capsule Take 1 capsule (20 mg total) by mouth at bedtime. 08/30/18  Yes Love, Ivan Anchors, PA-C  oxyCODONE (OXY IR/ROXICODONE) 5 MG immediate release tablet Take  1 tablet (5 mg total) by mouth every 6 (six) hours as needed for severe pain. 08/30/18  Yes Love, Ivan Anchors, PA-C  sevelamer carbonate (RENVELA) 800 MG tablet Take 1 tablet (800 mg total) by mouth 3 (three) times daily with meals. 08/30/18  Yes Love, Ivan Anchors, PA-C  silver sulfADIAZINE (SILVADENE) 1 % cream Apply topically daily. 08/30/18  Yes Love, Ivan Anchors, PA-C  timolol (BETIMOL) 0.5 % ophthalmic solution Place 1 drop into both eyes 2 (two) times daily. 05/07/17  Yes Joretta Bachelor, PA     Critical care time: 40 mins    Kennieth Rad, MSN, AGACNP-BC [facility] Pgr: [phone] or if no answer [phone] 09/08/2018, 2:33 PM

## 2018-09-08 NOTE — Progress Notes (Signed)
Sharol Given MD called to notify this RN that the patient's ETT needed to be pulled back 2.5 cm and that the OGT was barely in the stomach. Will have RRT pull back ETT and this RN will advance OGT per orders.

## 2018-09-08 NOTE — Progress Notes (Signed)
RT NOTE: RT retracted ETT to 23 at the teeth per MD order. ETT was originally 25 at the teeth/26 at the lip. Vitals are stable. RT will continue to monitor.

## 2018-09-08 NOTE — Anesthesia Procedure Notes (Signed)
Procedure Name: LMA Insertion Date/Time: 09/08/2018 11:49 AM Performed by: Neldon Newport, CRNA Pre-anesthesia Checklist: Timeout performed, Patient being monitored, Suction available, Emergency Drugs available and Patient identified Patient Re-evaluated:Patient Re-evaluated prior to induction Oxygen Delivery Method: Circle system utilized Preoxygenation: Pre-oxygenation with 100% oxygen Induction Type: IV induction Ventilation: Mask ventilation without difficulty LMA: LMA inserted LMA Size: 5.0 Number of attempts: 2 Placement Confirmation: breath sounds checked- equal and bilateral,  positive ETCO2 and ETT inserted through vocal cords under direct vision Tube secured with: Tape Dental Injury: Teeth and Oropharynx as per pre-operative assessment

## 2018-09-08 NOTE — H&P (Signed)
Howard Bell is an 53 y.o. male.   Chief Complaint: Gangrene left foot and ankle HPI: The patient is a 53 year old gentleman who is status post a right above-the-knee amputation and has developed progressive gangrenous changes of the left lower extremity which have failed to improve.  He presents today for left above-the-knee amputation.  Past Medical History:  Diagnosis Date  . Arthritis   . Congestive heart failure (CHF) ([place])   . Depression   . 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 Diabetic gastroparesis ([place]) 11/06/2017  . ESRD (end stage renal disease) on dialysis [facility])    M,W.F dialysis  . H/O seasonal allergies   . Hypertension   . Pneumonia   . Sleep apnea    not using it now, on continuous O2 2 L during the day, 3 L during the night    Past Surgical History:  Procedure Laterality Date  . AMPUTATION Right 05/14/2018   Procedure: AMPUTATION BELOW KNEE;  Surgeon: Newt Minion, MD;  Location: [place];  Service: Orthopedics;  Laterality: Right;  . AMPUTATION Right 08/04/2018   Procedure: RIGHT ABOVE KNEE AMPUTATION;  Surgeon: Newt Minion, MD;  Location: [place];  Service: Orthopedics;  Laterality: Right;  . ESOPHAGOGASTRODUODENOSCOPY (EGD) WITH PROPOFOL N/A 12/05/2017   Procedure: ESOPHAGOGASTRODUODENOSCOPY (EGD) WITH PROPOFOL;  Surgeon: Doran Stabler, MD;  Location: [facility];  Service: Gastroenterology;  Laterality: N/A;  . HERNIA REPAIR    . IR AV DIALY SHUNT INTRO NEEDLE/INTRACATH INITIAL W/PTA/IMG LEFT  03/30/2017    Family History  Problem Relation Age of Onset  . Diabetes Mother   . Hypertension Father    Social History:  reports that he has never smoked. He has never used smokeless tobacco. He reports that he does not drink alcohol or use drugs.  Allergies: No Known Allergies  No medications prior to admission.    No results found for this or any previous visit (from the past 48 hour(s)). No results found.  Review of  Systems  All other systems reviewed and are negative.   There were no vitals taken for this visit. Physical Exam  Constitutional: He appears well-developed and well-nourished. No distress.  HENT:  Head: Normocephalic and atraumatic.  Neck: No tracheal deviation present. No thyromegaly present.  Cardiovascular: Normal rate.  Respiratory: Effort normal. No stridor. No respiratory distress.  GI: Soft. He exhibits no distension.  Musculoskeletal:     Comments: Examination patient has a well-healing right above-the-knee amputation.  Sutures are harvested today.  Examination the left foot patient has progressive dry gangrenous changes of the left lower extremity.  Patient has complete full-thickness dry gangrenous changes involving the distal third of the calf the heel and the Achilles as well as the foot.  Skin: Skin is warm.     Assessment/Plan Gangrene left foot and ankle-plan left above-the-knee amputation- the procedure and possible benefits and risks including the risk of bleeding, infection, neurovascular injury, possible need for further surgery was discussed at length with the patient and his questions answered to his satisfaction.  The patient wishes to proceed with surgery at this time.  [name], PA-C 09/08/2018, 7:42 AM [facility] [phone]

## 2018-09-08 NOTE — Progress Notes (Signed)
Initial Nutrition Assessment  RD working remotely.  DOCUMENTATION CODES:   Not applicable (pt needs post-op weight to determine adjusted BMI, pre-op adjusted BMI is obese)  INTERVENTION:   - Please obtain new weight as pt is now s/p left AKA  Tube feeding recommendations: - Vital High Protein @ 25 ml/hr (600 ml/day) - Pro-stat 60 ml TID  Tube feeding regimen provides 1200 kcal, 143 grams of protein, and 502 ml of H2O.  Tube feeding regimen and current propofol provides 1644 total kcal (>100% of needs).  NUTRITION DIAGNOSIS:   Inadequate oral intake related to inability to eat as evidenced by NPO status.  GOAL:   Provide needs based on ASPEN/SCCM guidelines  MONITOR:   Weight trends, Vent status, Labs, Skin, I & O's  REASON FOR ASSESSMENT:   Ventilator    ASSESSMENT:   53 year old male who presented on 6/19 for left AKA due to gangrene of left foot an ankle. 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  6/19 - s/p left AKA and placement of wound VAC  Per CCM note, at the end of OR case, pt "became hypoxic and hypotensive requiring brief vasopressor support and intubation to be transferred to ICU and remain on mechanical ventilation."  Per CCM note, aim for extubation on 6/20. Nephrology consult pending as pt with ESRD on HD at baseline.  RD will leave TF recommendations.  Need new weight now that pt is s/p left AKA. Once new weight obtained, will calculate adjusted BMI and recalculate nutrition needs as appropriate.  Patient is currently intubated on ventilator support MV: 8.4 L/min Temp (24hrs), Avg:98.5 F (36.9 C), Min:98.5 F (36.9 C), Max:98.5 F (36.9 C) BP: 181/64 MAP: 95  Propofol: 16.8 ml/hr (provides 444 kcal daily from lipid)  Medications reviewed and include: Colace, Protonix  Labs reviewed: hemoglobin 9.5 CBG's: 160-180 x 24 hours  NUTRITION - FOCUSED PHYSICAL EXAM:  Unable to complete at this time. RD working  remotely.  Diet Order:   Diet Order            Diet NPO time specified  Diet effective now              EDUCATION NEEDS:   No education needs have been identified at this time  Skin:  Skin Assessment: Skin Integrity Issues: Wound Vac: left leg s/p left AKA Incisions: right leg s/p right AKA  Last BM:  no documented BM  Height:   Ht Readings from Last 1 Encounters:  09/05/18 6' (1.829 m)    Weight:   Wt Readings from Last 1 Encounters:  09/05/18 93.1 kg    Ideal Body Weight:  68 kg (adjusted for bilateral AKAs)  BMI:  Using weight and height from 6/16 (prior to left AKA) and adjusted for right AKA: 30.25 kg/m^2   Estimated Nutritional Needs:   Kcal:  335-456  Protein:  136-150 grams  Fluid:  >/= 1.0 L    Gaynell Face, MS, RD, LDN Inpatient Clinical Dietitian Pager: [phone] Weekend/After Hours: [phone]

## 2018-09-08 NOTE — Transfer of Care (Signed)
Immediate Anesthesia Transfer of Care Note  Patient: Howard Bell  Procedure(s) Performed: LEFT ABOVE KNEE AMPUTATION (Left Leg Upper) Application Of Wound Vac (Left Leg Upper)  Patient Location: SICU  Anesthesia Type:General  Level of Consciousness: Patient remains intubated per anesthesia plan  Airway & Oxygen Therapy: Patient remains intubated per anesthesia plan and Patient placed on Ventilator (see vital sign flow sheet for setting)  Post-op Assessment: Report given to RN, Post -op Vital signs reviewed and stable and Patient moving all extremities X 4  Post vital signs: Reviewed and stable  Last Vitals:  Vitals Value Taken Time  BP 107/65 09/08/18 1336  Temp    Pulse 75 09/08/18 1336  Resp 16 09/08/18 1336  SpO2 100 % 09/08/18 1336    Last Pain:  Vitals:   09/08/18 0856  PainSc: 0-No pain      Patients Stated Pain Goal: 2 (15/86/82 5749)  Complications: No apparent anesthesia complications

## 2018-09-08 NOTE — Op Note (Signed)
09/08/2018  12:44 PM  PATIENT:  Howard Bell    PRE-OPERATIVE DIAGNOSIS:  Gangrene Left Leg  POST-OPERATIVE DIAGNOSIS:  Same  PROCEDURE:  LEFT ABOVE KNEE AMPUTATION, Application Of Wound Vac  SURGEON:  Newt Minion, MD  PHYSICIAN ASSISTANT:None ANESTHESIA:   General  PREOPERATIVE INDICATIONS:  [name] is a  53 y.o. male with a diagnosis of Gangrene Left Leg who failed conservative measures and elected for surgical management.    The risks benefits and alternatives were discussed with the patient preoperatively including but not limited to the risks of infection, bleeding, nerve injury, cardiopulmonary complications, the need for revision surgery, among others, and the patient was willing to proceed.  OPERATIVE IMPLANTS: Praveena customizable and restore VAC  @ENCIMAGES @  OPERATIVE FINDINGS: Calcified vessels at the level of amputation  OPERATIVE PROCEDURE: Patient was brought the operating room and underwent a general anesthetic.  After adequate levels anesthesia were obtained patient's left lower extremity was prepped using DuraPrep draped into a sterile field a timeout was called.  A fishmouth incision was made just proximal to the patella.  This was carried down to the medial vascular bundle this was clamped and suture ligated with 2-0 silk.  The remainder the amputation was completed a reciprocating saw was used to amputate the femur.  Electrocautery was used for further hemostasis the wound was irrigated with normal saline.  The deep fascia layers were closed using #2 Vicryl the skin and superficial fascia layers were closed using 2-0 nylon.  A Praveena restore and customizable VAC were applied this had a good suction fit patient was extubated taken to PACU in stable condition.   DISCHARGE PLANNING:  Antibiotic duration: Antibiotics for 24 hours  Weightbearing: Transfer training only  Pain medication: Opioid pathway  Dressing care/ Wound VAC: Wound VAC on the  left for 1 week  Ambulatory devices: Not applicable transfers only with a walker  Discharge to: Anticipate discharge to home with home health physical therapy Home health nursing.  Follow-up: In the office 1 week post operative.

## 2018-09-08 NOTE — Progress Notes (Signed)
CRITICAL VALUE ALERT  Critical Value:  Troponin 0.04  Date & Time Notied:  09/08/2018 1608  Provider Notified: Kennieth Rad, NP  Orders Received/Actions taken: another troponin draw in six hours

## 2018-09-09 ENCOUNTER — Inpatient Hospital Stay (HOSPITAL_COMMUNITY): Payer: Medicare Other

## 2018-09-09 LAB — CBC
HCT: 24.1 % — ABNORMAL LOW (ref 39.0–52.0)
Hemoglobin: 7.3 g/dL — ABNORMAL LOW (ref 13.0–17.0)
MCH: 29.3 pg (ref 26.0–34.0)
MCHC: 30.3 g/dL (ref 30.0–36.0)
MCV: 96.8 fL (ref 80.0–100.0)
Platelets: 209 10*3/uL (ref 150–400)
RBC: 2.49 MIL/uL — ABNORMAL LOW (ref 4.22–5.81)
RDW: 19.8 % — ABNORMAL HIGH (ref 11.5–15.5)
WBC: 7.2 10*3/uL (ref 4.0–10.5)
nRBC: 0.3 % — ABNORMAL HIGH (ref 0.0–0.2)

## 2018-09-09 LAB — COMPREHENSIVE METABOLIC PANEL
ALT: 9 U/L (ref 0–44)
AST: 15 U/L (ref 15–41)
Albumin: 2.2 g/dL — ABNORMAL LOW (ref 3.5–5.0)
Alkaline Phosphatase: 96 U/L (ref 38–126)
Anion gap: 15 (ref 5–15)
BUN: 57 mg/dL — ABNORMAL HIGH (ref 6–20)
CO2: 22 mmol/L (ref 22–32)
Calcium: 8.3 mg/dL — ABNORMAL LOW (ref 8.9–10.3)
Chloride: 99 mmol/L (ref 98–111)
Creatinine, Ser: 11.15 mg/dL — ABNORMAL HIGH (ref 0.61–1.24)
GFR calc Af Amer: 5 mL/min — ABNORMAL LOW (ref >60)
GFR calc non Af Amer: 5 mL/min — ABNORMAL LOW (ref >60)
Glucose, Bld: 100 mg/dL — ABNORMAL HIGH (ref 70–99)
Potassium: 4 mmol/L (ref 3.5–5.1)
Sodium: 136 mmol/L (ref 135–145)
Total Bilirubin: 1.2 mg/dL (ref 0.3–1.2)
Total Protein: 7 g/dL (ref 6.5–8.1)

## 2018-09-09 LAB — HEPATITIS B SURFACE ANTIGEN: Hepatitis B Surface Ag: NEGATIVE

## 2018-09-09 LAB — GLUCOSE, CAPILLARY
Glucose-Capillary: 95 mg/dL (ref 70–99)
Glucose-Capillary: 99 mg/dL (ref 70–99)
Glucose-Capillary: 75 mg/dL (ref 70–99)
Glucose-Capillary: 115 mg/dL — ABNORMAL HIGH (ref 70–99)
Glucose-Capillary: 95 mg/dL (ref 70–99)
Glucose-Capillary: 95 mg/dL (ref 70–99)
Glucose-Capillary: 130 mg/dL — ABNORMAL HIGH (ref 70–99)

## 2018-09-09 LAB — TROPONIN I: Troponin I: 0.06 ng/mL (ref <0.03)

## 2018-09-09 LAB — TRIGLYCERIDES: Triglycerides: 172 mg/dL — ABNORMAL HIGH (ref <150)

## 2018-09-09 MED ORDER — LIDOCAINE-PRILOCAINE 2.5-2.5 % EX CREA
1.0000 "application " | TOPICAL_CREAM | CUTANEOUS | Status: DC | PRN
  Filled 2018-09-09: qty 5

## 2018-09-09 MED ORDER — PENTAFLUOROPROP-TETRAFLUOROETH EX AERO: 1.0000 "application " | INHALATION_SPRAY | CUTANEOUS | Status: DC | PRN

## 2018-09-09 MED ORDER — ATROPINE SULFATE 0.4 MG/ML IJ SOLN
INTRAMUSCULAR | Status: DC | PRN
  Administered 2018-09-08: .4 mg via INTRAVENOUS

## 2018-09-09 MED ORDER — EPINEPHRINE PF 1 MG/ML IJ SOLN
INTRAMUSCULAR | Status: DC | PRN
  Administered 2018-09-08 (×2): .01 mg via INTRAVENOUS
  Administered 2018-09-08: .1 mg via INTRAVENOUS
  Administered 2018-09-08: .01 mg via INTRAVENOUS

## 2018-09-09 MED ORDER — HEPARIN SODIUM (PORCINE) 1000 UNIT/ML DIALYSIS
1000.0000 [IU] | INTRAMUSCULAR | Status: DC | PRN
  Filled 2018-09-09: qty 1

## 2018-09-09 MED ORDER — CHLORHEXIDINE GLUCONATE CLOTH 2 % EX PADS
6.0000 | MEDICATED_PAD | Freq: Every day | CUTANEOUS | Status: DC
  Administered 2018-09-09 – 2018-09-12 (×3): 6 via TOPICAL

## 2018-09-09 MED ORDER — DEXMEDETOMIDINE HCL IN NACL 200 MCG/50ML IV SOLN
0.4000 ug/kg/h | INTRAVENOUS | Status: DC
  Administered 2018-09-09 (×2): 0.6 ug/kg/h via INTRAVENOUS
  Administered 2018-09-10 (×2): 0.4 ug/kg/h via INTRAVENOUS
  Filled 2018-09-09 (×6): qty 50

## 2018-09-09 MED ORDER — LIDOCAINE HCL (PF) 1 % IJ SOLN
5.0000 mL | INTRAMUSCULAR | Status: DC | PRN
  Filled 2018-09-09: qty 5

## 2018-09-09 MED ORDER — SODIUM CHLORIDE 0.9 % IV SOLN: 100.0000 mL | INTRAVENOUS | Status: DC | PRN

## 2018-09-09 MED ORDER — HEPARIN SODIUM (PORCINE) 5000 UNIT/ML IJ SOLN
5000.0000 [IU] | Freq: Three times a day (TID) | INTRAMUSCULAR | Status: DC
  Administered 2018-09-09 – 2018-09-16 (×19): 5000 [IU] via SUBCUTANEOUS
  Filled 2018-09-09 (×19): qty 1

## 2018-09-09 MED ORDER — ALTEPLASE 2 MG IJ SOLR
2.0000 mg | Freq: Once | INTRAMUSCULAR | Status: DC | PRN
  Filled 2018-09-09: qty 2

## 2018-09-09 MED ORDER — EPINEPHRINE 1 MG/10ML IJ SOSY: PREFILLED_SYRINGE | INTRAMUSCULAR | Status: DC | PRN

## 2018-09-09 NOTE — Consult Note (Addendum)
NAME:  Howard Bell, MRN:  [id number], DOB:  06-15-1965, LOS: 1 ADMISSION DATE:  09/08/2018, CONSULTATION DATE:  09/08/2018 REFERRING MD:  Dr. Sharol Given, CHIEF COMPLAINT:  Respiratory insufficiency    Brief History   53 year old male with history of OSA and chronic hypoxic respiratory failure on 2L O2, and 3L at night, ESRD - HD dependent MWF, PAD s/p R BKA 05/14/2018, HTN, DMT2, anemia, diabetic gastroparesis,  And depression admitted 6/19 to Dr. Sharol Given for elective left AKA after failed medical management of gangrene to left lower leg and foot.    Reportedly last had dialysis on 09/06/2018.  Plans were for patient to have dialysis next on Saturday 6/20.  Patient was taken to the OR and initially anesthesia was under LMA.  Surgery was without complication with estimated blood loss of 200 ml with wound vac applied.  Anesthesia was reversed however patient did not wake up, then became reportedly hypoxic then hypotensive requiring intermittent vasopressor support and endotracheal intubation.  Patient to be transferred to ICU on mechanical ventilation for further support and medical care.   Past Medical History  OSA/ chronic respiratory failure on baseline 2L O2 and 3L at night, ESRD MWF dialysis dependent, PAD s/p R BKA 05/14/2018, HTN, DMT2, anemia, diabetic gastroparesis, depression  Significant Hospital Events   6/19 Admitted/ OR - Arrived on propofol 50 mcg/kg.  No pressors.   Consults:   Procedures:  6/19 L AKA  6/19 ETT >> 6/19 right radial aline >>  Significant Diagnostic Tests:  6/16 TTE >> EF 60-65%, LV impaired diastolic relaxation.  No wall abnormalities.  Moderately elevated RVSP 52.3.    Micro Data:  6/19 SARS coronavirus2 >> negative  Antimicrobials:  6/19 ancef   Interim history/subjective:     09/09/2018 = agitated and hypertensive with diprivan but after changing to precedex calmer and meets sBT criteria. BP better on precedex    Objective   Blood pressure (!) 118/54,  pulse 69, temperature 98.3 F (36.8 C), temperature source Oral, resp. rate 15, weight 89.4 kg, SpO2 96 %.    Vent Mode: PSV;CPAP FiO2 (%):  [40 %-100 %] 40 % Set Rate:  [16 bmp] 16 bmp Vt Set:  [550 mL] 550 mL PEEP:  [5 cmH20] 5 cmH20 Pressure Support:  [5 cmH20] 5 cmH20 Plateau Pressure:  [23 cmH20-24 cmH20] 23 cmH20   Intake/Output Summary (Last 24 hours) at 09/09/2018 0948 Last data filed at 09/09/2018 0700 Gross per 24 hour  Intake 1910.18 ml  Output 450 ml  Net 1460.18 ml   Filed Weights   09/08/18 1500 09/09/18 0500  Weight: 90.8 kg 89.4 kg     General Appearance:  Looks criticall ill OBESE - _ Head:  Normocephalic, without obvious abnormality, atraumatic Eyes:  PERRL - yes, conjunctiva/corneas - muddy     Ears:  Normal external ear canals, both ears Nose:  G tube - no Throat:  ETT TUBE - yes , OG tube - yes Neck:  Supple,  No enlargement/tenderness/nodules Lungs: Clear to auscultation bilaterally, Ventilator   Synchrony - yes on SBt Heart:  S1 and S2 normal, no murmur, CVP - no.  Pressors - no, Abdomen:  Soft, no masses, no organomegaly Genitalia / Rectal:  Not done Extremities:  Extremities- Bilateral AKA Skin:  ntact in exposed areas . Sacral area - not examined Neurologic:  Sedation - precedex -> RASS --1 . Moves all 4s - yes but bilateral AKA. CAM-ICU - neg . Orientation - followed commands  LABS    PULMONARY Recent Labs  Lab 09/08/18 1301  PHART 7.173*  PCO2ART 68.6*  PO2ART 308.0*  HCO3 25.5  TCO2 28  O2SAT 100.0    CBC Recent Labs  Lab 09/08/18 1301 09/08/18 1440 09/09/18 0330  HGB 9.5* 7.6* 7.3*  HCT 28.0* 24.8* 24.1*  WBC  --  8.6 7.2  PLT  --  201 209    COAGULATION No results for input(s): INR in the last 168 hours.  CARDIAC   Recent Labs  Lab 09/08/18 1440 09/08/18 2147 09/09/18 0330  TROPONINI 0.04* 0.06* 0.06*   No results for input(s): PROBNP in the last 168 hours.   CHEMISTRY Recent Labs  Lab 09/08/18 0934  09/08/18 1301 09/08/18 1440 09/09/18 0330  NA 137 137 138 136  K 4.2 4.1 3.7 4.0  CL  --   --  100 99  CO2  --   --  23 22  GLUCOSE 182*  --  205* 100*  BUN  --   --  52* 57*  CREATININE  --   --  10.12* 11.15*  CALCIUM  --   --  7.9* 8.3*  MG  --   --  2.1  --   PHOS  --   --  5.4*  --    Estimated Creatinine Clearance: 8.6 mL/min (A) (by C-G formula based on SCr of 11.15 mg/dL (H)).   LIVER Recent Labs  Lab 09/09/18 0330  AST 15  ALT 9  ALKPHOS 96  BILITOT 1.2  PROT 7.0  ALBUMIN 2.2*     INFECTIOUS No results for input(s): LATICACIDVEN, PROCALCITON in the last 168 hours.   ENDOCRINE CBG (last 3)  Recent Labs    09/09/18 0208 09/09/18 0325 09/09/18 0832  GLUCAP 95 99 75         IMAGING x48h  - image(s) personally visualized  -   highlighted in bold Dg Chest Port 1 View  Result Date: 09/08/2018 CLINICAL DATA:  Endotracheal tube placement. Acute respiratory failure. EXAM: PORTABLE CHEST 1 VIEW COMPARISON:  08/23/2018 and 08/15/2018 and 05/12/2018 FINDINGS: Endotracheal tube is in the proximal right mainstem bronchus and needs to be retracted approximately 2.5 cm. NG tube tip is in the fundus of the stomach with the side hole at the GE junction. There is bibasilar atelectasis or hazy infiltrate which appears minimally progressed since the prior study of 08/23/2018. This is probably accentuated by the shallow inspiration. Vascular stent in the left subclavian vein. No acute bone abnormality. IMPRESSION: ETT in the right mainstem bronchus. Bibasilar infiltrates/atelectasis, unchanged. Critical Value/emergent results were called by telephone at the time of interpretation on 09/08/2018 at 3:43 pm to Georges Lynch, RN , who verbally acknowledged these results. Electronically Signed   By: Lorriane Shire M.D.   On: 09/08/2018 15:46      Resolved Hospital Problem list    Assessment & Plan:   ASSESSMENT / PLAN:  PULMONARY A:   Acute respiratory insufficiency  post procedure with resp acidosois Chronic hypoxic respiratory failure/ OSA on 2L O2/ 3L at night - never smoker   09/09/2018 -> meets extubation criteria  P:   Extubate on precedex to bipap and bipap qhs Check abg 09/10/18 Needs determination for home bipap - with day time abg prior to dc -   NEUROLOGIC A:   FOllowing commands on precedex P:   Monitor Gently wean off precedex post extubation    VASCULAR A:   Hx of HTN, PAD/ PVD  - 6/19 -  Hypotension peri and post op- resolved currently and tolerating propofol: less likely related to infection, had normal WBC/ has been afebrile   6/20  - hypertensive and improved with precedex  P:  Monitor sQ heparin dVt priph start 09/09/2018  CARDIAC STRUCTURAL A: - had essentially normal TTE 6/16 with normal EF 60-65%  6/20 - trop profile c/w stress  P: mnitoir  CARDIAC ELECTRICAL A: sinus   P: monitor  INFECTIOUS A:   Preop abx prior to amputation  6/20 - no evidence of sepsis  P:   Anti-infectives (From admission, onward)   Start     Dose/Rate Route Frequency Ordered Stop   09/08/18 0845  ceFAZolin (ANCEF) IVPB 2g/100 mL premix     2 g 200 mL/hr over 30 Minutes Intravenous On call to O.R. 09/08/18 0831 09/08/18 1151       RENAL A:   ESRD on MWF - last iHD Wednesday 6/17 - AVF LUE  P:  Per renal  ELECTROLYTES A:  Nil acute P: renal   GASTROINTESTINAL A:   Hx of ? gastroparesis  P:   ppi Npo post extubation reglan when able with qtc monitoring  HEMATOLOGIC A:  Anemia of chronic disease and renal   P:  - iron oral - PRBC for hgb </= 6.9gm%    - exceptions are   -  if ACS susepcted/confirmed then transfuse for hgb </= 8.0gm%,  or    -  active bleeding with hemodynamic instability, then transfuse regardless of hemoglobin value   At at all times try to transfuse 1 unit prbc as possible with exception of active hemorrhage     ENDOCRINE A:   Dm  P:   ssi  MSK/DERM  LLE/  foot gangrene s/p Left AKA Healing R AKA for prior gangrene  P:  Per Dr. Sharol Given Ongoing wound vac Defer abx to ortho Defer VTE ppx till ok w/ ortho     Best practice:  Diet: NPO Pain/Anxiety/Delirium protocol (if indicated): propofol / prn fentanyl VAP protocol (if indicated): yes DVT prophylaxis: per ortho  GI prophylaxis: PPI Glucose control: SSI sensitive Mobility: BR Code Status: full  Family Communication: wife updated by ortho 09/08/2018   Disposition: ICU. Given to triad Dr Karleen Hampshire for 09/10/18     ATTESTATION & SIGNATURE   The patient Howard Bell is critically ill with multiple organ systems failure and requires high complexity decision making for assessment and support, frequent evaluation and titration of therapies, application of advanced monitoring technologies and extensive interpretation of multiple databases.   Critical Care Time devoted to patient care services described in this note is  30  Minutes. This time reflects time of care of this signee Dr Brand Males. This critical care time does not reflect procedure time, or teaching time or supervisory time of PA/NP/Med student/Med Resident etc but could involve care discussion time     Dr. Brand Males, M.D., [facility].C.P Pulmonary and Critical Care Medicine Staff Physician [facility] Pager: [phone], If no answer or between  15:00h - 7:00h: call [phone]  09/09/2018 10:31 AM

## 2018-09-09 NOTE — Progress Notes (Signed)
Patient ID: Howard Bell, male   DOB: 14-Dec-1965, 53 y.o.   MRN: [id number] Patient is postoperative day 1 left above-the-knee amputation.  The wound VAC is functioning well stable right above-the-knee amputation.  Patient's vital signs are stable he opens his eyes follows commands.  And anticipate weaning off the ventilator.

## 2018-09-09 NOTE — Progress Notes (Signed)
RT took pt off of BIPAP (servo NIV/PC) at this time. Pt placed on 4 LPM. Pt order is for QHS. RT will place pt back on after last oral meds for the night. RT will continue to monitor.

## 2018-09-09 NOTE — Progress Notes (Signed)
[facility] Progress Note   Assessment/ Plan:   Dialysis Orders: AF MWF 4.25 hours 450/800 EDW  93 2 K 2.25 Ca profile heparin 9000 Left AVGG Parsabiv 10  hgb 8 at d/c 6/10 d/c on home O2  iPTH 640 6/17 had been off Parsabiv - during recent hospitalization Mircera 200 given 6/16 - tsat after last hosp d/c 37%   Assessment/Plan: 1. S/o left AKA 6/19 and recent (5/15) AKA hold heparin for now on HD 2. Acute hypoxic respiratory failure/ OSA ^ pCO2 on home and nocturnal O2 requiring vent post surgery.  Intubated overnight, just extubated, weaning off precedex gtt.  May need home BiPaP per notes.    3. ESRD - MWF - last HD Wednesday istat K 4.1 - will add on BMP to labs drawn earlier. K 3.7 yesterday, HD today 6/20. will need a lower heparin dose at d/c due to amputation- holding heparin for now 4. Hypertension/volume  --post hypotension - back to baseline now. UF as tolerated, adjust EDW.   5.  Anemia  - hgb down post op 7.6 was 8.6 6/17 at d/c not due for ESA - received 200 Mircera 6/16 - add weekly dose of Fe.  Hgb 7.3 this AM 6.  Metabolic bone disease -  Holding parsabiv while here due to unavailability - resume binders when eating - not on VDRA presumably due to high corr Ca 7.  Nutrition - chronic low alb. 8.  DM - per primary  Subjective:    Extubated this AM.  On precedex gtt.  For HD today.     Objective:   BP 115/64   Pulse 68   Temp 98.3 F (36.8 C) (Oral)   Resp 13   Wt 89.4 kg   SpO2 94%   BMI 26.73 kg/m   Physical Exam: General: WDWN on [place] O2 Head: NCAT sclera not icteric  Lungs: normal WOB, coarse rhonchi bilaterally Heart: RRR with S1 S2.  Abdomen: soft NT + BS Lower extremities: left AKA with wound vac - right AKA with staples slight crusting mild edema Neuro: sedated Dialysis Access: left AVGG + bruit and thrill Labs: BMET Recent Labs  Lab 09/08/18 0934 09/08/18 1301 09/08/18 1440 09/09/18 0330  NA 137 137 138 136  K 4.2 4.1 3.7 4.0   CL  --   --  100 99  CO2  --   --  23 22  GLUCOSE 182*  --  205* 100*  BUN  --   --  52* 57*  CREATININE  --   --  10.12* 11.15*  CALCIUM  --   --  7.9* 8.3*  PHOS  --   --  5.4*  --    CBC Recent Labs  Lab 09/08/18 0934 09/08/18 1301 09/08/18 1440 09/09/18 0330  WBC  --   --  8.6 7.2  NEUTROABS  --   --  6.6  --   HGB 10.9* 9.5* 7.6* 7.3*  HCT 32.0* 28.0* 24.8* 24.1*  MCV  --   --  98.8 96.8  PLT  --   --  201 209    @[email or web address] Medications:    . chlorhexidine gluconate (MEDLINE KIT)  15 mL Mouth Rinse BID  . Chlorhexidine Gluconate Cloth  6 each Topical Q0600  . Chlorhexidine Gluconate Cloth  6 each Topical Daily  . docusate sodium  100 mg Oral BID  . heparin injection (subcutaneous)  5,000 Units Subcutaneous Q8H  . insulin aspart  0-9 Units Subcutaneous Q4H  .   mouth rinse  15 mL Mouth Rinse 10 times per day  . pantoprazole (PROTONIX) IV  40 mg Intravenous Daily     Madelon Lips MD [facility] pgr [phone] 09/09/2018, 10:55 AM

## 2018-09-09 NOTE — Procedures (Signed)
Extubation Procedure Note  Patient Details:   Name: Howard Bell DOB: Mar 31, 1965 MRN: [id number]   Airway Documentation:    Vent end date: 09/09/18 Vent end time: 0944   Evaluation  O2 sats: stable throughout Complications: No apparent complications Patient did tolerate procedure well. Bilateral Breath Sounds: Clear, Diminished   Pt extubated per MD order. Pt had + cuff leak. Placed on 4L [place] tolerating well, no distress noted. Pt able to voice & strong cough.  Ciro Backer 09/09/2018, 9:47 AM

## 2018-09-09 NOTE — Consult Note (Addendum)
[place] Nurse wound consult note Reason for Consult:DFU Wound type:Infectious Patient had an amputation by Dr. Meridee Score. No role for WOC nursing at this time.  We did not see.  [place] nursing team will not follow, but will remain available to this patient, the nursing and medical teams.  Please re-consult if needed. Thanks, Maudie Flakes, MSN, RN, [place], Arther Abbott  Pager# [phone]

## 2018-09-09 NOTE — Progress Notes (Signed)
Pt placed on Bipap at this time per MD order. Pt tolerating well, RT will monitor

## 2018-09-09 NOTE — Anesthesia Postprocedure Evaluation (Addendum)
Anesthesia Post Note  Patient: Zeph Riebel  Procedure(s) Performed: LEFT ABOVE KNEE AMPUTATION (Left Leg Upper) Application Of Wound Vac (Left Leg Upper)     Patient location during evaluation: ICU Anesthesia Type: General Level of consciousness: sedated Pain management: pain level controlled Vital Signs Assessment: post-procedure vital signs reviewed and stable Respiratory status: patient on ventilator - see flowsheet for VS and patient remains intubated per anesthesia plan Cardiovascular status: blood pressure returned to baseline Postop Assessment: no apparent nausea or vomiting Anesthetic complications: yes Anesthetic complication details: respiratory eventComments: See intraoperative notes    Last Vitals:  Vitals:   09/09/18 1045 09/09/18 1116  BP:  (!) 118/57  Pulse: 68 66  Resp: 13 (!) 22  Temp:    SpO2: 94% 99%    Last Pain:  Vitals:   09/09/18 0833  TempSrc: Oral  PainSc:                  Alia Parsley L Treena Cosman

## 2018-09-10 DIAGNOSIS — Z89611 Acquired absence of right leg above knee: Secondary | ICD-10-CM

## 2018-09-10 DIAGNOSIS — Z978 Presence of other specified devices: Secondary | ICD-10-CM

## 2018-09-10 DIAGNOSIS — Z89612 Acquired absence of left leg above knee: Secondary | ICD-10-CM

## 2018-09-10 LAB — BASIC METABOLIC PANEL
Anion gap: 13 (ref 5–15)
BUN: 35 mg/dL — ABNORMAL HIGH (ref 6–20)
CO2: 25 mmol/L (ref 22–32)
Calcium: 8.2 mg/dL — ABNORMAL LOW (ref 8.9–10.3)
Chloride: 98 mmol/L (ref 98–111)
Creatinine, Ser: 7.84 mg/dL — ABNORMAL HIGH (ref 0.61–1.24)
GFR calc Af Amer: 8 mL/min — ABNORMAL LOW (ref >60)
GFR calc non Af Amer: 7 mL/min — ABNORMAL LOW (ref >60)
Glucose, Bld: 157 mg/dL — ABNORMAL HIGH (ref 70–99)
Potassium: 4.3 mmol/L (ref 3.5–5.1)
Sodium: 136 mmol/L (ref 135–145)

## 2018-09-10 LAB — CBC WITH DIFFERENTIAL/PLATELET
Abs Immature Granulocytes: 0.03 10*3/uL (ref 0.00–0.07)
Basophils Absolute: 0.1 10*3/uL (ref 0.0–0.1)
Basophils Relative: 1 %
Eosinophils Absolute: 0.4 10*3/uL (ref 0.0–0.5)
Eosinophils Relative: 7 %
HCT: 24.7 % — ABNORMAL LOW (ref 39.0–52.0)
Hemoglobin: 7.4 g/dL — ABNORMAL LOW (ref 13.0–17.0)
Immature Granulocytes: 1 %
Lymphocytes Relative: 15 %
Lymphs Abs: 0.9 10*3/uL (ref 0.7–4.0)
MCH: 29.1 pg (ref 26.0–34.0)
MCHC: 30 g/dL (ref 30.0–36.0)
MCV: 97.2 fL (ref 80.0–100.0)
Monocytes Absolute: 0.7 10*3/uL (ref 0.1–1.0)
Monocytes Relative: 11 %
Neutro Abs: 3.9 10*3/uL (ref 1.7–7.7)
Neutrophils Relative %: 65 %
Platelets: 219 10*3/uL (ref 150–400)
RBC: 2.54 MIL/uL — ABNORMAL LOW (ref 4.22–5.81)
RDW: 20.1 % — ABNORMAL HIGH (ref 11.5–15.5)
WBC: 6 10*3/uL (ref 4.0–10.5)
nRBC: 0 % (ref 0.0–0.2)

## 2018-09-10 LAB — GLUCOSE, CAPILLARY
Glucose-Capillary: 108 mg/dL — ABNORMAL HIGH (ref 70–99)
Glucose-Capillary: 128 mg/dL — ABNORMAL HIGH (ref 70–99)
Glucose-Capillary: 108 mg/dL — ABNORMAL HIGH (ref 70–99)
Glucose-Capillary: 130 mg/dL — ABNORMAL HIGH (ref 70–99)
Glucose-Capillary: 177 mg/dL — ABNORMAL HIGH (ref 70–99)
Glucose-Capillary: 144 mg/dL — ABNORMAL HIGH (ref 70–99)

## 2018-09-10 LAB — PROTIME-INR
INR: 1.3 — ABNORMAL HIGH (ref 0.8–1.2)
Prothrombin Time: 15.7 seconds — ABNORMAL HIGH (ref 11.4–15.2)

## 2018-09-10 LAB — HEPATIC FUNCTION PANEL
ALT: 7 U/L (ref 0–44)
AST: 16 U/L (ref 15–41)
Albumin: 2.2 g/dL — ABNORMAL LOW (ref 3.5–5.0)
Alkaline Phosphatase: 102 U/L (ref 38–126)
Bilirubin, Direct: 0.1 mg/dL (ref 0.0–0.2)
Indirect Bilirubin: 1 mg/dL — ABNORMAL HIGH (ref 0.3–0.9)
Total Bilirubin: 1.1 mg/dL (ref 0.3–1.2)
Total Protein: 7.1 g/dL (ref 6.5–8.1)

## 2018-09-10 LAB — TROPONIN I: Troponin I: 0.05 ng/mL

## 2018-09-10 LAB — LACTIC ACID, PLASMA: Lactic Acid, Venous: 1 mmol/L (ref 0.5–1.9)

## 2018-09-10 MED ORDER — FLUTICASONE PROPIONATE 50 MCG/ACT NA SUSP
1.0000 | Freq: Every day | NASAL | Status: DC
  Administered 2018-09-10 – 2018-09-14 (×3): 1 via NASAL
  Filled 2018-09-10: qty 16

## 2018-09-10 MED ORDER — PANTOPRAZOLE SODIUM 40 MG PO TBEC
40.0000 mg | DELAYED_RELEASE_TABLET | Freq: Every day | ORAL | Status: DC
  Administered 2018-09-11 – 2018-09-16 (×6): 40 mg via ORAL
  Filled 2018-09-10 (×6): qty 1

## 2018-09-10 MED ORDER — CHLORHEXIDINE GLUCONATE 0.12 % MT SOLN
15.0000 mL | Freq: Two times a day (BID) | OROMUCOSAL | Status: DC
  Administered 2018-09-11 – 2018-09-16 (×6): 15 mL via OROMUCOSAL
  Filled 2018-09-10 (×8): qty 15

## 2018-09-10 MED ORDER — BRIMONIDINE TARTRATE 0.2 % OP SOLN
1.0000 [drp] | Freq: Two times a day (BID) | OPHTHALMIC | Status: DC
  Administered 2018-09-10 – 2018-09-16 (×11): 1 [drp] via OPHTHALMIC
  Filled 2018-09-10: qty 5

## 2018-09-10 MED ORDER — METOCLOPRAMIDE HCL 10 MG PO TABS
5.0000 mg | ORAL_TABLET | Freq: Three times a day (TID) | ORAL | Status: DC
  Administered 2018-09-10 – 2018-09-16 (×13): 5 mg via ORAL
  Filled 2018-09-10 (×14): qty 1

## 2018-09-10 MED ORDER — HYDROMORPHONE HCL 1 MG/ML IJ SOLN
1.0000 mg | INTRAMUSCULAR | Status: DC | PRN
  Administered 2018-09-10 – 2018-09-15 (×19): 1 mg via INTRAVENOUS
  Filled 2018-09-10 (×20): qty 1

## 2018-09-10 MED ORDER — INSULIN ASPART 100 UNIT/ML ~~LOC~~ SOLN
0.0000 [IU] | Freq: Three times a day (TID) | SUBCUTANEOUS | Status: DC
  Administered 2018-09-10: 16:00:00 1 [IU] via SUBCUTANEOUS
  Administered 2018-09-11 – 2018-09-12 (×4): 2 [IU] via SUBCUTANEOUS
  Administered 2018-09-13: 3 [IU] via SUBCUTANEOUS
  Administered 2018-09-14 – 2018-09-15 (×2): 1 [IU] via SUBCUTANEOUS
  Administered 2018-09-15 – 2018-09-16 (×2): 3 [IU] via SUBCUTANEOUS
  Administered 2018-09-16: 1 [IU] via SUBCUTANEOUS

## 2018-09-10 MED ORDER — INSULIN ASPART 100 UNIT/ML ~~LOC~~ SOLN: 0.0000 [IU] | Freq: Every day | SUBCUTANEOUS | Status: DC

## 2018-09-10 MED ORDER — ORAL CARE MOUTH RINSE
15.0000 mL | Freq: Two times a day (BID) | OROMUCOSAL | Status: DC
  Administered 2018-09-10 – 2018-09-16 (×2): 15 mL via OROMUCOSAL

## 2018-09-10 MED ORDER — TIMOLOL HEMIHYDRATE 0.5 % OP SOLN: 1.0000 [drp] | Freq: Two times a day (BID) | OPHTHALMIC | Status: DC

## 2018-09-10 MED ORDER — METOPROLOL SUCCINATE ER 50 MG PO TB24
50.0000 mg | ORAL_TABLET | Freq: Every day | ORAL | Status: DC
  Administered 2018-09-10 – 2018-09-16 (×6): 50 mg via ORAL
  Filled 2018-09-10 (×6): qty 1

## 2018-09-10 MED ORDER — TIMOLOL MALEATE 0.5 % OP SOLN
1.0000 [drp] | Freq: Two times a day (BID) | OPHTHALMIC | Status: DC
  Administered 2018-09-10 – 2018-09-16 (×11): 1 [drp] via OPHTHALMIC
  Filled 2018-09-10: qty 5

## 2018-09-10 MED ORDER — CALCIUM ACETATE (PHOS BINDER) 667 MG PO CAPS
1334.0000 mg | ORAL_CAPSULE | Freq: Three times a day (TID) | ORAL | Status: DC
  Administered 2018-09-10 – 2018-09-16 (×14): 1334 mg via ORAL
  Filled 2018-09-10 (×16): qty 2

## 2018-09-10 NOTE — Social Work (Signed)
CSW acknowledging consult for SNF placement. Will follow for therapy recommendations.   Ercell Razon, MSW, LCSWA [facility] [phone]   

## 2018-09-10 NOTE — Progress Notes (Signed)
[facility] Consult progress note                                                                              Patient Demographics  Howard Bell, is a 53 y.o. male, DOB - January 29, 1966, TAV:[id number]  Admit date - 09/08/2018   Admitting Physician Newt Minion, MD  Outpatient Primary MD for the patient is Sagardia, Ines Bloomer, MD  Outpatient specialists:   LOS - 2  days   Medical records reviewed and are as summarized below:    No chief complaint on file.      Brief summary   Patient is a 53 year old male with history of OSA and chronic hypoxic respiratory failure on 2L O2, and 3L at night, ESRD - HD dependent MWF, PAD s/p R BKA 05/14/2018, HTN, DMT2, anemia, diabetic gastroparesis and depression was admitted 6/19 to Dr. Sharol Given for elective left AKA after failed medical management of gangrene to left lower leg and foot.   Patientreportedly last had dialysis on 09/06/2018.  Plans were for patient to have dialysis next on Saturday 6/20.  Patient was taken to the OR, surgery was without complication, estimated blood loss 200 cc with wound VAC applied. Anesthesia was reversed however patient did not wake up, then became reportedly hypoxic, then hypotensive requiring intermittent vasopressor support and endotracheal intubation.  Patient was transferred to ICU under PCCM care.  Patient was extubated on 6/20, off the vasopressors and Precedex drip.  CCM requested [facility] to assume care as consultants.   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 -Patient reports that he does have BiPAP nightly at home which is not working.  Will need case management  consult regarding BiPAP machine. -Wean O2 as tolerated, BiPAP at night.  Acute metabolic encephalopathy -Resolved, postop, off the Precedex, following commands  Hypertension -Postop patient had hypotension, needed vasopressors -2D echo 6/16 had shown normal EF 60 to 65%. -Mild troponin elevation likely due to demand ischemia, acute respiratory failure, hypotension -BP now elevated 183/90, restart Toprol-XL 50 mg daily  History of PAD/PVD -Management as #1  ESRD on hemodialysis, MWF -Nephrology consulted, following closely  History of gastroparesis -Will restart Reglan  GERD -Continue PPI  Diabetes mellitus type 2, with renal complications, ESRD -Change to sliding scale insulin sensitive, q. before meals and at bedtime -Hemoglobin A1c 7.5 in 07/2018    Code Status: Full code DVT Prophylaxis: Heparin Family Communication: Discussed in detail with the patient, all imaging results, lab results explained to the patient    Disposition Plan: Transfer to progressive care from ICU.  Time Spent in minutes 35 minutes  Procedures:  Hemodialysis Intubation, extubation LEFT ABOVE KNEE AMPUTATION, Application Of Wound Vac   Consultants:   Primary orthopedics CCM  Antimicrobials:   Anti-infectives (From admission, onward)   Start  Dose/Rate Route Frequency Ordered Stop   09/08/18 0845  ceFAZolin (ANCEF) IVPB 2g/100 mL premix     2 g 200 mL/hr over 30 Minutes Intravenous On call to O.R. 09/08/18 0831 09/08/18 1151          Medications  Scheduled Meds: . chlorhexidine gluconate (MEDLINE KIT)  15 mL Mouth Rinse BID  . Chlorhexidine Gluconate Cloth  6 each Topical Q0600  . Chlorhexidine Gluconate Cloth  6 each Topical Daily  . docusate sodium  100 mg Oral BID  . heparin injection (subcutaneous)  5,000 Units Subcutaneous Q8H  . insulin aspart  0-5 Units Subcutaneous QHS  . insulin aspart  0-9 Units Subcutaneous TID WC  . pantoprazole (PROTONIX) IV  40 mg  Intravenous Daily   Continuous Infusions: . sodium chloride 10 mL/hr at 09/10/18 0700  . sodium chloride    . sodium chloride    . [START ON 09/13/2018] ferric gluconate (FERRLECIT/NULECIT) IV     PRN Meds:.sodium chloride, sodium chloride, sodium chloride, albuterol, alteplase, bisacodyl, heparin, hydrALAZINE, HYDROmorphone (DILAUDID) injection, lidocaine (PF), lidocaine-prilocaine, ondansetron **OR** ondansetron (ZOFRAN) IV, pentafluoroprop-tetrafluoroeth      Subjective:   Howard Bell was seen and examined today.  No complaints per the patient, no chest pain.  Weaning O2, on 4 L at the time of my examination.  Pain in the left lower extremity, 7/10, tolerable with pain medications.  No nausea or vomiting, fevers or chills.  Patient denies dizziness, chest pain, shortness of breath, abdominal pain . No acute events overnight.    Objective:   Vitals:   09/10/18 0700 09/10/18 0758 09/10/18 0800 09/10/18 0814  BP: 140/62 (!) 166/78    Pulse: 78 81 82   Resp:      Temp:    99.2 F (37.3 C)  TempSrc:    Oral  SpO2: 96% 96% 96%   Weight:      Height:        Intake/Output Summary (Last 24 hours) at 09/10/2018 0821 Last data filed at 09/10/2018 0700 Gross per 24 hour  Intake 583.82 ml  Output 1030 ml  Net -446.18 ml     Wt Readings from Last 3 Encounters:  09/09/18 89.4 kg  09/05/18 93.1 kg  08/30/18 93.1 kg     Exam  General: Alert and oriented x 3, NAD  Eyes:   HEENT:  Atraumatic, normocephalic  Cardiovascular: S1 S2 auscultated, Regular rate and rhythm.  Respiratory: Clear to auscultation bilaterally, no wheezing, rales or rhonchi  Gastrointestinal: Soft, nontender, nondistended, + bowel sounds  Ext: Bilateral AKA, wound VAC on the left   Neuro no new deficits  Musculoskeletal: No digital cyanosis, clubbing  Skin: No rashes  Psych: Normal affect and demeanor, alert and oriented x3    Data Reviewed:  I have personally reviewed following labs  and imaging studies  Micro Results Recent Results (from the past 240 hour(s))  SARS Coronavirus 2 (CEPHEID - Performed in [place] hospital lab), Hosp Order     Status: None   Collection Time: 09/08/18  8:15 AM   Specimen: Nasopharyngeal Swab  Result Value Ref Range Status   SARS Coronavirus 2 NEGATIVE NEGATIVE Final    Comment: (NOTE) If result is NEGATIVE SARS-CoV-2 target nucleic acids are NOT DETECTED. The SARS-CoV-2 RNA is generally detectable in upper and lower  respiratory specimens during the acute phase of infection. The lowest  concentration of SARS-CoV-2 viral copies this assay can detect is 250  copies / mL. A negative result does  not preclude SARS-CoV-2 infection  and should not be used as the sole basis for treatment or other  patient management decisions.  A negative result may occur with  improper specimen collection / handling, submission of specimen other  than nasopharyngeal swab, presence of viral mutation(s) within the  areas targeted by this assay, and inadequate number of viral copies  (<250 copies / mL). A negative result must be combined with clinical  observations, patient history, and epidemiological information. If result is POSITIVE SARS-CoV-2 target nucleic acids are DETECTED. The SARS-CoV-2 RNA is generally detectable in upper and lower  respiratory specimens dur ing the acute phase of infection.  Positive  results are indicative of active infection with SARS-CoV-2.  Clinical  correlation with patient history and other diagnostic information is  necessary to determine patient infection status.  Positive results do  not rule out bacterial infection or co-infection with other viruses. If result is PRESUMPTIVE POSTIVE SARS-CoV-2 nucleic acids MAY BE PRESENT.   A presumptive positive result was obtained on the submitted specimen  and confirmed on repeat testing.  While 2019 novel coronavirus  (SARS-CoV-2) nucleic acids may be present in the submitted  sample  additional confirmatory testing may be necessary for epidemiological  and / or clinical management purposes  to differentiate between  SARS-CoV-2 and other Sarbecovirus currently known to infect humans.  If clinically indicated additional testing with an alternate test  methodology [phone]) is advised. The SARS-CoV-2 RNA is generally  detectable in upper and lower respiratory sp ecimens during the acute  phase of infection. The expected result is Negative. Fact Sheet for Patients:  StrictlyIdeas.no Fact Sheet for Healthcare Providers: BankingDealers.co.za This test is not yet approved or cleared by the [place] FDA and has been authorized for detection and/or diagnosis of SARS-CoV-2 by FDA under an Emergency Use Authorization (EUA).  This EUA will remain in effect (meaning this test can be used) for the duration of the COVID-19 declaration under Section 564(b)(1) of the Act, 21 U.S.C. section 360bbb-3(b)(1), unless the authorization is terminated or revoked sooner. Performed at [facility], [place] [address] [place], [place] [address]   MRSA PCR Screening     Status: Abnormal   Collection Time: 09/08/18  5:01 PM   Specimen: Nasopharyngeal  Result Value Ref Range Status   MRSA by PCR POSITIVE (A) NEGATIVE Final    Comment:        The GeneXpert MRSA Assay (FDA approved for NASAL specimens only), is one component of a comprehensive MRSA colonization surveillance program. It is not intended to diagnose MRSA infection nor to guide or monitor treatment for MRSA infections. RESULT CALLED TO, READ BACK BY AND VERIFIED WITHGeradine Girt RN 09/08/18 1932 JDW Performed at [facility], [address], [place], [place] [address]     Radiology Reports Dg Chest 2 View  Result Date: 08/23/2018 CLINICAL DATA:  Hypoxia EXAM: CHEST - 2 VIEW COMPARISON:  08/15/2018 FINDINGS: Cardiac shadow is mildly prominent. Lungs are well  aerated bilaterally with mild bibasilar chronic scarring stable from the previous exam. Vascular stents on the left are again seen and stable. No new bony abnormality is noted. IMPRESSION: Bibasilar scarring.  No acute abnormality noted. Electronically Signed   By: Inez Catalina M.D.   On: 08/23/2018 12:18   Dg Chest 2 View  Result Date: 08/15/2018 CLINICAL DATA:  Fever. Left foot infection. History of diabetes, end-stage renal disease and pneumonia. EXAM: CHEST - 2 VIEW COMPARISON:  Radiographs  05/12/2018 and 05/02/2018. CT 05/02/2018 and 11/09/2017. FINDINGS: The heart size and mediastinal contours are stable. The patient has known chronic adenopathy in the right paratracheal and subcarinal regions which appears stable. Vascular stents are noted in the left subclavian and axillary vessels. Patchy right perihilar and lower lobe airspace opacities appear largely chronic, not significantly changed over the recent prior studies. The left lung is clear. There is no pleural effusion or pneumothorax. No acute osseous findings. IMPRESSION: No significant change in the appearance of the chest compared with prior studies obtained over the last 8 months. The right basilar airspace opacity appears chronic and may reflect postinflammatory scarring or chronic atypical inflammation. Grossly stable mediastinal adenopathy. Electronically Signed   By: Richardean Sale M.D.   On: 08/15/2018 16:13   Ct Head Wo Contrast  Result Date: 08/15/2018 CLINICAL DATA:  Altered mental status EXAM: CT HEAD WITHOUT CONTRAST TECHNIQUE: Contiguous axial images were obtained from the base of the skull through the vertex without intravenous contrast. COMPARISON:  Head CT 11/06/2017 FINDINGS: Brain: No acute intracranial hemorrhage. Unchanged appearance of right parietal, left occipital and right cerebellar infarcts. No midline shift or other mass effect. Bilateral basal ganglia mineralization. Vascular: No abnormal hyperdensity of the major  intracranial arteries or dural venous sinuses. No intracranial atherosclerosis. Skull: The visualized skull base, calvarium and extracranial soft tissues are normal. Sinuses/Orbits: No fluid levels or advanced mucosal thickening of the visualized paranasal sinuses. No mastoid or middle ear effusion. The orbits are normal. IMPRESSION: 1. No acute abnormality. 2. Unchanged appearance of left occipital, right parietal and right cerebellar infarcts. Electronically Signed   By: Ulyses Jarred M.D.   On: 08/15/2018 21:48   Portable Chest Xray  Result Date: 09/09/2018 CLINICAL DATA:  Intubation. EXAM: PORTABLE CHEST 1 VIEW COMPARISON:  09/08/2018 FINDINGS: Endotracheal tube terminates 4.1 cm from the carina. Enteric catheter within the expected location of the stomach. Cardiomediastinal silhouette is normal. Mediastinal contours appear intact. There is no evidence of pneumothorax. Bilateral basilar peribronchial infiltrates have slightly improved. Osseous structures are without acute abnormality. Soft tissues are grossly normal. IMPRESSION: 1. Support apparatus as described. 2. Slightly improved bilateral basilar peribronchial infiltrates. Electronically Signed   By: Fidela Salisbury M.D.   On: 09/09/2018 14:31   Dg Chest Port 1 View  Result Date: 09/08/2018 CLINICAL DATA:  Endotracheal tube placement. Acute respiratory failure. EXAM: PORTABLE CHEST 1 VIEW COMPARISON:  08/23/2018 and 08/15/2018 and 05/12/2018 FINDINGS: Endotracheal tube is in the proximal right mainstem bronchus and needs to be retracted approximately 2.5 cm. NG tube tip is in the fundus of the stomach with the side hole at the GE junction. There is bibasilar atelectasis or hazy infiltrate which appears minimally progressed since the prior study of 08/23/2018. This is probably accentuated by the shallow inspiration. Vascular stent in the left subclavian vein. No acute bone abnormality. IMPRESSION: ETT in the right mainstem bronchus. Bibasilar  infiltrates/atelectasis, unchanged. Critical Value/emergent results were called by telephone at the time of interpretation on 09/08/2018 at 3:43 pm to Georges Lynch, RN , who verbally acknowledged these results. Electronically Signed   By: Lorriane Shire M.D.   On: 09/08/2018 15:46   Dg Foot Complete Left  Result Date: 08/15/2018 CLINICAL DATA:  Fever. Left foot infection. History of diabetes, end-stage renal disease and pneumonia. EXAM: LEFT FOOT - COMPLETE 3+ VIEW COMPARISON:  None. FINDINGS: There is no evidence of acute fracture or dislocation. There are possible old healed fractures of the 4th and 5th metatarsal necks. There  are minimal degenerative changes at the 1st metatarsophalangeal joint. No erosive changes or bone destruction identified. There are prominent vascular calcifications consistent with diabetes. The soft tissues are diffusely prominent without focal swelling or foreign body. IMPRESSION: No acute osseous findings or evidence of osteomyelitis. Prominent vascular calcifications. Electronically Signed   By: Richardean Sale M.D.   On: 08/15/2018 16:15    Lab Data:  CBC: Recent Labs  Lab 09/08/18 0934 09/08/18 1301 09/08/18 1440 09/09/18 0330 09/10/18 0506  WBC  --   --  8.6 7.2 6.0  NEUTROABS  --   --  6.6  --  3.9  HGB 10.9* 9.5* 7.6* 7.3* 7.4*  HCT 32.0* 28.0* 24.8* 24.1* 24.7*  MCV  --   --  98.8 96.8 97.2  PLT  --   --  201 209 696   Basic Metabolic Panel: Recent Labs  Lab 09/08/18 0934 09/08/18 1301 09/08/18 1440 09/09/18 0330 09/09/18 2316  NA 137 137 138 136 136  K 4.2 4.1 3.7 4.0 4.3  CL  --   --  100 99 98  CO2  --   --  23 22 25   GLUCOSE 182*  --  205* 100* 157*  BUN  --   --  52* 57* 35*  CREATININE  --   --  10.12* 11.15* 7.84*  CALCIUM  --   --  7.9* 8.3* 8.2*  MG  --   --  2.1  --   --   PHOS  --   --  5.4*  --   --    GFR: Estimated Creatinine Clearance: 12.2 mL/min (A) (by C-G formula based on SCr of 7.84 mg/dL (H)). Liver Function  Tests: Recent Labs  Lab 09/09/18 0330 09/10/18 0506  AST 15 16  ALT 9 7  ALKPHOS 96 102  BILITOT 1.2 1.1  PROT 7.0 7.1  ALBUMIN 2.2* 2.2*   No results for input(s): LIPASE, AMYLASE in the last 168 hours. No results for input(s): AMMONIA in the last 168 hours. Coagulation Profile: Recent Labs  Lab 09/10/18 0506  INR 1.3*   Cardiac Enzymes: Recent Labs  Lab 09/08/18 1440 09/08/18 2147 09/09/18 0330 09/10/18 0506  TROPONINI 0.04* 0.06* 0.06* 0.05*   BNP (last 3 results) No results for input(s): PROBNP in the last 8760 hours. HbA1C: No results for input(s): HGBA1C in the last 72 hours. CBG: Recent Labs  Lab 09/09/18 1222 09/09/18 1618 09/09/18 1923 09/09/18 2311 09/10/18 0345  GLUCAP 115* 95 95 130* 108*   Lipid Profile: Recent Labs    09/09/18 0330  TRIG 172*   Thyroid Function Tests: No results for input(s): TSH, T4TOTAL, FREET4, T3FREE, THYROIDAB in the last 72 hours. Anemia Panel: No results for input(s): VITAMINB12, FOLATE, FERRITIN, TIBC, IRON, RETICCTPCT in the last 72 hours. Urine analysis:    Component Value Date/Time   COLORURINE RED (A) 11/19/2016 0020   APPEARANCEUR TURBID (A) 11/19/2016 0020   LABSPEC  11/19/2016 0020    TEST NOT REPORTED DUE TO COLOR INTERFERENCE OF URINE PIGMENT   PHURINE  11/19/2016 0020    TEST NOT REPORTED DUE TO COLOR INTERFERENCE OF URINE PIGMENT   GLUCOSEU (A) 11/19/2016 0020    TEST NOT REPORTED DUE TO COLOR INTERFERENCE OF URINE PIGMENT   HGBUR (A) 11/19/2016 0020    TEST NOT REPORTED DUE TO COLOR INTERFERENCE OF URINE PIGMENT   BILIRUBINUR (A) 11/19/2016 0020    TEST NOT REPORTED DUE TO COLOR INTERFERENCE OF URINE PIGMENT   KETONESUR (  A) 11/19/2016 0020    TEST NOT REPORTED DUE TO COLOR INTERFERENCE OF URINE PIGMENT   PROTEINUR (A) 11/19/2016 0020    TEST NOT REPORTED DUE TO COLOR INTERFERENCE OF URINE PIGMENT   NITRITE (A) 11/19/2016 0020    TEST NOT REPORTED DUE TO COLOR INTERFERENCE OF URINE PIGMENT    LEUKOCYTESUR (A) 11/19/2016 0020    TEST NOT REPORTED DUE TO COLOR INTERFERENCE OF URINE PIGMENT     Ripudeep Rai M.D. [facility] 09/10/2018, 8:21 AM  Pager: [phone] Between 7am to 7pm - call Pager - [phone]  After 7pm go to [email or web address] - password [email or web address]  Call night coverage person covering after 7pm

## 2018-09-10 NOTE — Progress Notes (Signed)
Subjective: 2 Days Post-Op Procedure(s) (LRB): LEFT ABOVE KNEE AMPUTATION (Left) Application Of Wound Vac (Left) Patient reports pain as mild.  Patient has been extubated and is stable.    Objective: Vital signs in last 24 hours: Temp:  [96.5 F (35.8 C)-99.2 F (37.3 C)] 99.2 F (37.3 C) (06/21 0814) Pulse Rate:  [60-84] 82 (06/21 0800) Resp:  [9-25] 25 (06/20 2321) BP: (85-166)/(42-93) 166/78 (06/21 0758) SpO2:  [92 %-100 %] 96 % (06/21 0800) Arterial Line BP: (89-178)/(43-83) 177/60 (06/21 0800) FiO2 (%):  [40 %] 40 % (06/21 0000) Weight:  [89.4 kg] 89.4 kg (06/20 1215)  Intake/Output from previous day: 06/20 0701 - 06/21 0700 In: 606.2 [I.V.:501.2; IV Piggyback:105] Out: 1030  Intake/Output this shift: No intake/output data recorded.  Recent Labs    09/08/18 0934 09/08/18 1301 09/08/18 1440 09/09/18 0330 09/10/18 0506  HGB 10.9* 9.5* 7.6* 7.3* 7.4*   Recent Labs    09/09/18 0330 09/10/18 0506  WBC 7.2 6.0  RBC 2.49* 2.54*  HCT 24.1* 24.7*  PLT 209 219   Recent Labs    09/09/18 0330 09/09/18 2316  NA 136 136  K 4.0 4.3  CL 99 98  CO2 22 25  BUN 57* 35*  CREATININE 11.15* 7.84*  GLUCOSE 100* 157*  CALCIUM 8.3* 8.2*   Recent Labs    09/10/18 0506  INR 1.3*    Neurologically intact  Wound vac in place and functioning properly- 25cc blood in canister    Assessment/Plan: 2 Days Post-Op Procedure(s) (LRB): LEFT ABOVE KNEE AMPUTATION (Left) Application Of Wound Vac (Left) Up with therapy  NWB LLE Continue wound vac       Howard Bell 09/10/2018, 9:02 AM

## 2018-09-10 NOTE — Progress Notes (Signed)
[facility] Progress Note   Assessment/ Plan:   Dialysis Orders: AF MWF 4.25 hours 450/800 EDW  93 2 K 2.25 Ca profile heparin 9000 Left AVGG Parsabiv 10  hgb 8 at d/c 6/10 d/c on home O2  iPTH 640 6/17 had been off Parsabiv - during recent hospitalization Mircera 200 given 6/16 - tsat after last hosp d/c 37%   Assessment/Plan: 1. S/o left AKA 6/19 and recent (5/15) AKA hold heparin for now on HD 2. Acute hypoxic respiratory failure/ OSA ^ pCO2 on home and nocturnal O2 requiring vent post surgery.  Required a day of post-op intubation, extubated yesterday 6/20  May need home BiPaP per notes.    3. ESRD - MWF -Dialyzed off schedule yesterday  6/20. Plan to get back on schedule tomorrow 6/22 4. Hypertension/volume  --post hypotension - back to baseline now. UF as tolerated, adjust EDW.   5.  Anemia  - received 200 Mircera 6/16; Hgb 7.3, follow and add back ESA as appropriate 6.  Metabolic bone disease -  Holding parsabiv while here due to unavailability; resuming binders now as eating (phoslo) 7.  Nutrition - chronic low alb. 8.  DM - per primary  Subjective:    S/p HD yesterday with 1L removed.  + pain this AM, about to get dilaudid.    Objective:   BP (!) 166/78   Pulse 82   Temp 99.2 F (37.3 C) (Oral)   Resp (!) 25   Ht 6' (1.829 m)   Wt 89.4 kg   SpO2 96%   BMI 26.73 kg/m   Physical Exam: General: sitting in bed, on [place] O2 Head: NCAT sclera not icteric  Lungs: normal WOB, coarse rhonchi bilaterally, improved Heart: RRR with S1 S2.  Abdomen: soft NT + BS Lower extremities: left AKA with wound vac - right AKA with staples  Neuro: AAO, thought today was Saturday so redirected Dialysis Access: left AVGG + bruit and thrill Labs: BMET Recent Labs  Lab 09/08/18 0934 09/08/18 1301 09/08/18 1440 09/09/18 0330 09/09/18 2316  NA 137 137 138 136 136  K 4.2 4.1 3.7 4.0 4.3  CL  --   --  100 99 98  CO2  --   --  _0 GLUCOSE 182*  --  205* 100* 157*   BUN  --   --  52* 57* 35*  CREATININE  --   --  10.12* 11.15* 7.84*  CALCIUM  --   --  7.9* 8.3* 8.2*  PHOS  --   --  5.4*  --   --    CBC Recent Labs  Lab 09/08/18 1301 09/08/18 1440 09/09/18 0330 09/10/18 0506  WBC  --  8.6 7.2 6.0  NEUTROABS  --  6.6  --  3.9  HGB 9.5* 7.6* 7.3* 7.4*  HCT 28.0* 24.8* 24.1* 24.7*  MCV  --  98.8 96.8 97.2  PLT  --  201 209 219    _1 @ Medications:    . chlorhexidine gluconate (MEDLINE KIT)  15 mL Mouth Rinse BID  . Chlorhexidine Gluconate Cloth  6 each Topical Q0600  . Chlorhexidine Gluconate Cloth  6 each Topical Daily  . docusate sodium  100 mg Oral BID  . heparin injection (subcutaneous)  5,000 Units Subcutaneous Q8H  . insulin aspart  0-5 Units Subcutaneous QHS  . insulin aspart  0-9 Units Subcutaneous TID WC  . pantoprazole (PROTONIX) IV  40 mg Intravenous Daily     Madelon Lips  MD [place] pgr [phone] 09/10/2018, 8:42 AM

## 2018-09-10 NOTE — Consult Note (Signed)
[facility] Nurse wound consult note Reason for Consult: Chronic, nonhealing wound on patient's right hand, 5th digit. Wound type: Full thickness Pressure Injury POA: NA Measurement: 0.8cm round x 0.2cm Wound bed: pink, dry with callus surrounding Drainage (amount, consistency, odor) none Periwound: callus as noted above, dry Dressing procedure/placement/frequency: I will provide Nursing with orders for once daily wound care using a topical antimicrobial dressing, xeroform, to the site. Patient to follow up with primary MD following discharge for this area.  [name] nursing team will not follow, but will remain available to this patient, the nursing and medical teams.  Please re-consult if needed. Thanks, Maudie Flakes, MSN, RN, [place], Arther Abbott  Pager# [phone]

## 2018-09-11 ENCOUNTER — Encounter (HOSPITAL_COMMUNITY): Payer: Self-pay | Admitting: Orthopedic Surgery

## 2018-09-11 LAB — BASIC METABOLIC PANEL
Anion gap: 12 (ref 5–15)
BUN: 48 mg/dL — ABNORMAL HIGH (ref 6–20)
CO2: 25 mmol/L (ref 22–32)
Calcium: 8.2 mg/dL — ABNORMAL LOW (ref 8.9–10.3)
Chloride: 96 mmol/L — ABNORMAL LOW (ref 98–111)
Creatinine, Ser: 10.67 mg/dL — ABNORMAL HIGH (ref 0.61–1.24)
GFR calc Af Amer: 6 mL/min — ABNORMAL LOW (ref >60)
GFR calc non Af Amer: 5 mL/min — ABNORMAL LOW (ref >60)
Glucose, Bld: 181 mg/dL — ABNORMAL HIGH (ref 70–99)
Potassium: 3.9 mmol/L (ref 3.5–5.1)
Sodium: 133 mmol/L — ABNORMAL LOW (ref 135–145)

## 2018-09-11 LAB — CBC WITH DIFFERENTIAL/PLATELET
Abs Immature Granulocytes: 0.02 10*3/uL (ref 0.00–0.07)
Basophils Absolute: 0.1 10*3/uL (ref 0.0–0.1)
Basophils Relative: 1 %
Eosinophils Absolute: 0.4 10*3/uL (ref 0.0–0.5)
Eosinophils Relative: 6 %
HCT: 24.4 % — ABNORMAL LOW (ref 39.0–52.0)
Hemoglobin: 7.4 g/dL — ABNORMAL LOW (ref 13.0–17.0)
Immature Granulocytes: 0 %
Lymphocytes Relative: 22 %
Lymphs Abs: 1.3 10*3/uL (ref 0.7–4.0)
MCH: 30.1 pg (ref 26.0–34.0)
MCHC: 30.3 g/dL (ref 30.0–36.0)
MCV: 99.2 fL (ref 80.0–100.0)
Monocytes Absolute: 0.8 10*3/uL (ref 0.1–1.0)
Monocytes Relative: 14 %
Neutro Abs: 3.3 10*3/uL (ref 1.7–7.7)
Neutrophils Relative %: 57 %
Platelets: 204 10*3/uL (ref 150–400)
RBC: 2.46 MIL/uL — ABNORMAL LOW (ref 4.22–5.81)
RDW: 20.3 % — ABNORMAL HIGH (ref 11.5–15.5)
WBC: 5.9 10*3/uL (ref 4.0–10.5)
nRBC: 0 % (ref 0.0–0.2)

## 2018-09-11 LAB — POCT I-STAT 7, (LYTES, BLD GAS, ICA,H+H)
Bicarbonate: 25.2 mmol/L (ref 20.0–28.0)
Calcium, Ion: 1.06 mmol/L — ABNORMAL LOW (ref 1.15–1.40)
HCT: 24 % — ABNORMAL LOW (ref 39.0–52.0)
Hemoglobin: 8.2 g/dL — ABNORMAL LOW (ref 13.0–17.0)
O2 Saturation: 100 %
Potassium: 3.5 mmol/L (ref 3.5–5.1)
Sodium: 138 mmol/L (ref 135–145)
TCO2: 26 mmol/L (ref 22–32)
pCO2 arterial: 43.3 mmHg (ref 32.0–48.0)
pH, Arterial: 7.373 (ref 7.350–7.450)
pO2, Arterial: 274 mmHg — ABNORMAL HIGH (ref 83.0–108.0)

## 2018-09-11 LAB — HEMOGLOBIN A1C
Hgb A1c MFr Bld: 6.4 % — ABNORMAL HIGH (ref 4.8–5.6)
Mean Plasma Glucose: 137 mg/dL

## 2018-09-11 LAB — GLUCOSE, CAPILLARY
Glucose-Capillary: 158 mg/dL — ABNORMAL HIGH (ref 70–99)
Glucose-Capillary: 114 mg/dL — ABNORMAL HIGH (ref 70–99)
Glucose-Capillary: 122 mg/dL — ABNORMAL HIGH (ref 70–99)
Glucose-Capillary: 133 mg/dL — ABNORMAL HIGH (ref 70–99)

## 2018-09-11 MED ORDER — PRO-STAT SUGAR FREE PO LIQD
30.0000 mL | Freq: Two times a day (BID) | ORAL | Status: DC
  Administered 2018-09-11 – 2018-09-15 (×5): 30 mL via ORAL
  Filled 2018-09-11 (×8): qty 30

## 2018-09-11 MED ORDER — RENA-VITE PO TABS
1.0000 | ORAL_TABLET | Freq: Every day | ORAL | Status: DC
  Administered 2018-09-11 – 2018-09-15 (×5): 1 via ORAL
  Filled 2018-09-11 (×6): qty 1

## 2018-09-11 MED ORDER — AMLODIPINE BESYLATE 5 MG PO TABS
5.0000 mg | ORAL_TABLET | Freq: Every day | ORAL | Status: DC
  Administered 2018-09-12: 5 mg via ORAL
  Filled 2018-09-11: qty 1

## 2018-09-11 MED ORDER — MUPIROCIN 2 % EX OINT
TOPICAL_OINTMENT | Freq: Two times a day (BID) | CUTANEOUS | Status: DC
  Administered 2018-09-11 – 2018-09-13 (×5): via NASAL
  Administered 2018-09-14: 1 via NASAL
  Administered 2018-09-14 – 2018-09-16 (×4): via NASAL
  Filled 2018-09-11 (×3): qty 22

## 2018-09-11 MED ORDER — NEPRO/CARBSTEADY PO LIQD
237.0000 mL | ORAL | Status: DC
  Administered 2018-09-13 – 2018-09-16 (×4): 237 mL via ORAL

## 2018-09-11 MED ORDER — HYDROMORPHONE HCL 1 MG/ML IJ SOLN
INTRAMUSCULAR | Status: AC
  Filled 2018-09-11: qty 1

## 2018-09-11 NOTE — Progress Notes (Signed)
Visit made to patients room to place patient on BIPAP for night rest.  Howard Bell states he wants to stay up and watch TV for a while and is not ready for BIPAP at this time.

## 2018-09-11 NOTE — Progress Notes (Addendum)
[facility] Progress Note   Subjective:    No new c/o's.  Doesn't have much to say.    Objective:   BP (!) 156/56 (BP Location: Right Arm)   Pulse 79   Temp 98.4 F (36.9 C) (Oral)   Resp 16   Ht 6' (1.829 m)   Wt 89.8 kg   SpO2 97%   BMI 26.85 kg/m   Physical Exam: General: sitting in bed, on [place] O2 Head: NCAT sclera not icteric  Lungs: normal WOB, coarse rhonchi bilaterally, improved Heart: RRR with S1 S2.  Abdomen: soft NT + BS Lower extremities: left AKA with wound vac - right AKA with staples  Neuro: AAO,  Dialysis Access: left AVGG + bruit and thrill  Dialysis: AF MWF   4h 36mn  4 50/800  93kg  2/2.25 bath  Hep 9000  L AVG - parsabiv 10, iPTH 640 6/17  - Hb 8 at dc 6/10 on home O2 - Mircera 200 given 6/16 - tsat after last hosp d/c 37%  - had been off Parsabiv during recent hospitalization    Assessment/ Plan:     Assessment/Plan: 1. 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 Then went to CIR, then came back for AMS. Then went back to CIR. Then underwent L AKA 09/08/18 for multiple wounds not responding to Rx.  So now is bilat AKA.  2. Acute hypoxic respiratory failure: required vent post surgery.  Extubated on 6/20, doing well now.  3. ESRD - MWF -Dialyzed off schedule yesterday  6/20. Plan HD today on sched 4. Hypertension/volume  - BP's ok, down 3kg from prior edw.  Adjust prn.  5.  Anemia  - received 200 Mircera 6/16; Hgb 7.3, follow and add back ESA as appropriate 6.  Metabolic bone disease -  Holding parsabiv while here due to unavailability; resuming binders now as eating (phoslo) 7.  Nutrition - chronic low alb. 8.  DM - per primary    RKelly Splinter MD 09/11/2018, 2:12 PM    BMET Recent Labs  Lab 09/08/18 0934 09/08/18 1301 09/08/18 1336 09/08/18 1440 09/09/18 0330 09/09/18 2316 09/11/18 0422  NA 137 137 138 138 136 136 133*  K 4.2 4.1 3.5 3.7 4.0  4.3 3.9  CL  --   --   --  100 99 98 96*  CO2  --   --   --  _0 GLUCOSE 182*  --   --  205* 100* 157* 181*  BUN  --   --   --  52* 57* 35* 48*  CREATININE  --   --   --  10.12* 11.15* 7.84* 10.67*  CALCIUM  --   --   --  7.9* 8.3* 8.2* 8.2*  PHOS  --   --   --  5.4*  --   --   --    CBC Recent Labs  Lab 09/08/18 1440 09/09/18 0330 09/10/18 0506 09/11/18 0422  WBC 8.6 7.2 6.0 5.9  NEUTROABS 6.6  --  3.9 3.3  HGB 7.6* 7.3* 7.4* 7.4*  HCT 24.8* 24.1* 24.7* 24.4*  MCV 98.8 96.8 97.2 99.2  PLT 201 209 219 204    _1 @ Medications:    . amLODipine  5 mg Oral Daily  . brimonidine  1 drop Both Eyes BID  . calcium acetate  1,334 mg Oral TID WC  .  chlorhexidine  15 mL Mouth Rinse BID  . chlorhexidine gluconate (MEDLINE KIT)  15 mL Mouth Rinse BID  . Chlorhexidine Gluconate Cloth  6 each Topical Q0600  . Chlorhexidine Gluconate Cloth  6 each Topical Daily  . docusate sodium  100 mg Oral BID  . feeding supplement (NEPRO CARB STEADY)  237 mL Oral Q24H  . feeding supplement (PRO-STAT SUGAR FREE 64)  30 mL Oral BID  . fluticasone  1 spray Each Nare Daily  . heparin injection (subcutaneous)  5,000 Units Subcutaneous Q8H  . insulin aspart  0-5 Units Subcutaneous QHS  . insulin aspart  0-9 Units Subcutaneous TID WC  . mouth rinse  15 mL Mouth Rinse q12n4p  . metoCLOPramide  5 mg Oral TID AC  . metoprolol succinate  50 mg Oral Daily  . multivitamin  1 tablet Oral QHS  . mupirocin ointment   Nasal BID  . pantoprazole  40 mg Oral Q0600  . timolol  1 drop Both Eyes BID

## 2018-09-11 NOTE — Progress Notes (Signed)
[facility] progress note                                                                              Patient Demographics  Howard Bell, is a 53 y.o. male, DOB - 22-Nov-1965, OIZ:[id number]  Admit date - 09/08/2018   Admitting Physician Newt Minion, MD  Outpatient Primary MD for the patient is Sagardia, Ines Bloomer, MD  Outpatient specialists:   LOS - 3  days   Medical records reviewed and are as summarized below:    No chief complaint on file.      Brief summary   Patient is a 53 year old male with history of OSA and chronic hypoxic respiratory failure on 2L O2, and 3L at night, ESRD - HD dependent MWF, PAD s/p R BKA 05/14/2018, HTN, DMT2, anemia, diabetic gastroparesis and depression was admitted 6/19 to Dr. Sharol Given for elective left AKA after failed medical management of gangrene to left lower leg and foot.   Patientreportedly last had dialysis on 09/06/2018.  Plans were for patient to have dialysis next on Saturday 6/20.  Patient was taken to the OR, surgery was without complication, estimated blood loss 200 cc with wound VAC applied. Anesthesia was reversed however patient did not wake up, then became reportedly hypoxic, then hypotensive requiring intermittent vasopressor support and endotracheal intubation.  Patient was transferred to ICU under PCCM care.  Patient was extubated on 6/20, off the vasopressors and Precedex drip.  CCM requested [facility] to assume care as consultants.   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 -Patient reports that he does have BiPAP nightly at home which is not working.  Will need case management  consult regarding BiPAP machine. -Doing well, O2 sats 98% on 3 L, at baseline.  Overnight on BiPAP  Acute metabolic encephalopathy -Resolved, alert and oriented x3, off the Precedex drip.     Hypertension -Postop patient had hypotension, needed vasopressors -2D echo 6/16 had shown normal EF 60 to 65%. -Mild troponin elevation likely due to demand ischemia, acute respiratory failure, hypotension -BP now elevated, started on Toprol-XL on 6/21, add Norvasc.  History of PAD/PVD -Management as #1  ESRD on hemodialysis, MWF -Nephrology consulted, following closely  History of gastroparesis -Continue Reglan  GERD -Continue PPI  Diabetes mellitus type 2, with renal complications, ESRD -Change to sliding scale insulin sensitive, q. before meals and at bedtime -Hemoglobin A1c 7.5 in 07/2018    Code Status: Full code DVT Prophylaxis: Heparin Family Communication: Discussed in detail with the patient, all imaging results, lab results explained to the patient    Disposition Plan: Transfer to the progressive floor for BiPAP at night, back to skilled nursing facility when cleared by orthopedics  Time Spent in minutes 25 minutes  Procedures:  Hemodialysis Intubation, extubation LEFT ABOVE KNEE AMPUTATION, Application  Of Wound Vac   Consultants:   Primary orthopedics CCM  Antimicrobials:   Anti-infectives (From admission, onward)   Start     Dose/Rate Route Frequency Ordered Stop   09/08/18 0845  ceFAZolin (ANCEF) IVPB 2g/100 mL premix     2 g 200 mL/hr over 30 Minutes Intravenous On call to O.R. 09/08/18 0831 09/08/18 1151         Medications  Scheduled Meds:  brimonidine  1 drop Both Eyes BID   calcium acetate  1,334 mg Oral TID WC   chlorhexidine  15 mL Mouth Rinse BID   chlorhexidine gluconate (MEDLINE KIT)  15 mL Mouth Rinse BID   Chlorhexidine Gluconate Cloth  6 each Topical Q0600   Chlorhexidine Gluconate Cloth  6 each Topical Daily   docusate  sodium  100 mg Oral BID   feeding supplement (NEPRO CARB STEADY)  237 mL Oral Q24H   feeding supplement (PRO-STAT SUGAR FREE 64)  30 mL Oral BID   fluticasone  1 spray Each Nare Daily   heparin injection (subcutaneous)  5,000 Units Subcutaneous Q8H   insulin aspart  0-5 Units Subcutaneous QHS   insulin aspart  0-9 Units Subcutaneous TID WC   mouth rinse  15 mL Mouth Rinse q12n4p   metoCLOPramide  5 mg Oral TID AC   metoprolol succinate  50 mg Oral Daily   multivitamin  1 tablet Oral QHS   mupirocin ointment   Nasal BID   pantoprazole  40 mg Oral Q0600   timolol  1 drop Both Eyes BID   Continuous Infusions:  sodium chloride Stopped (09/10/18 1325)   sodium chloride     sodium chloride     [START ON 09/13/2018] ferric gluconate (FERRLECIT/NULECIT) IV     PRN Meds:.sodium chloride, sodium chloride, sodium chloride, albuterol, alteplase, bisacodyl, heparin, hydrALAZINE, HYDROmorphone (DILAUDID) injection, lidocaine (PF), lidocaine-prilocaine, ondansetron **OR** ondansetron (ZOFRAN) IV, pentafluoroprop-tetrafluoroeth      Subjective:   Howard Bell was seen and examined today.  Overnight needed BiPAP otherwise no complaints.  Weaned to 3 L, no chest pain or shortness of breath.  No nausea or vomiting, fevers or chills.  Patient denies dizziness, chest pain, shortness of breath, abdominal pain . No acute events overnight.    Objective:   Vitals:   09/11/18 0700 09/11/18 0820 09/11/18 0839 09/11/18 0900  BP:   (!) 159/78 137/70  Pulse: 81  82 83  Resp:      Temp:  98 F (36.7 C)    TempSrc:  Oral    SpO2: 98%   96%  Weight:      Height:        Intake/Output Summary (Last 24 hours) at 09/11/2018 1110 Last data filed at 09/10/2018 2100 Gross per 24 hour  Intake 133.18 ml  Output --  Net 133.18 ml     Wt Readings from Last 3 Encounters:  09/09/18 89.4 kg  09/05/18 93.1 kg  08/30/18 93.1 kg    Physical Exam  General: Alert and oriented x 3,  NAD  Eyes:   HEENT:  Atraumatic, normocephalic  Cardiovascular: S1 S2 clear, no murmurs, RRR  Respiratory: CTAB, no wheezing, rales or rhonchi  Gastrointestinal: Obese, soft, nontender, nondistended, NBS  Ext: Bilateral AKA  Neuro: no new deficits  Musculoskeletal: No cyanosis, clubbing  Skin: No rashes  Psych: Normal affect and demeanor, alert and oriented x3    Data Reviewed:  I have personally reviewed following labs and imaging studies  Micro Results Recent Results (  from the past 240 hour(s))  SARS Coronavirus 2 (CEPHEID - Performed in [place] hospital lab), Hosp Order     Status: None   Collection Time: 09/08/18  8:15 AM   Specimen: Nasopharyngeal Swab  Result Value Ref Range Status   SARS Coronavirus 2 NEGATIVE NEGATIVE Final    Comment: (NOTE) If result is NEGATIVE SARS-CoV-2 target nucleic acids are NOT DETECTED. The SARS-CoV-2 RNA is generally detectable in upper and lower  respiratory specimens during the acute phase of infection. The lowest  concentration of SARS-CoV-2 viral copies this assay can detect is 250  copies / mL. A negative result does not preclude SARS-CoV-2 infection  and should not be used as the sole basis for treatment or other  patient management decisions.  A negative result may occur with  improper specimen collection / handling, submission of specimen other  than nasopharyngeal swab, presence of viral mutation(s) within the  areas targeted by this assay, and inadequate number of viral copies  (<250 copies / mL). A negative result must be combined with clinical  observations, patient history, and epidemiological information. If result is POSITIVE SARS-CoV-2 target nucleic acids are DETECTED. The SARS-CoV-2 RNA is generally detectable in upper and lower  respiratory specimens dur ing the acute phase of infection.  Positive  results are indicative of active infection with SARS-CoV-2.  Clinical  correlation with patient history and  other diagnostic information is  necessary to determine patient infection status.  Positive results do  not rule out bacterial infection or co-infection with other viruses. If result is PRESUMPTIVE POSTIVE SARS-CoV-2 nucleic acids MAY BE PRESENT.   A presumptive positive result was obtained on the submitted specimen  and confirmed on repeat testing.  While 2019 novel coronavirus  (SARS-CoV-2) nucleic acids may be present in the submitted sample  additional confirmatory testing may be necessary for epidemiological  and / or clinical management purposes  to differentiate between  SARS-CoV-2 and other Sarbecovirus currently known to infect humans.  If clinically indicated additional testing with an alternate test  methodology [phone]) is advised. The SARS-CoV-2 RNA is generally  detectable in upper and lower respiratory sp ecimens during the acute  phase of infection. The expected result is Negative. Fact Sheet for Patients:  StrictlyIdeas.no Fact Sheet for Healthcare Providers: BankingDealers.co.za This test is not yet approved or cleared by the [place] FDA and has been authorized for detection and/or diagnosis of SARS-CoV-2 by FDA under an Emergency Use Authorization (EUA).  This EUA will remain in effect (meaning this test can be used) for the duration of the COVID-19 declaration under Section 564(b)(1) of the Act, 21 U.S.C. section 360bbb-3(b)(1), unless the authorization is terminated or revoked sooner. Performed at [facility], [place] [address], [place], [place] [address]   MRSA PCR Screening     Status: Abnormal   Collection Time: 09/08/18  5:01 PM   Specimen: Nasopharyngeal  Result Value Ref Range Status   MRSA by PCR POSITIVE (A) NEGATIVE Final    Comment:        The GeneXpert MRSA Assay (FDA approved for NASAL specimens only), is one component of a comprehensive MRSA colonization surveillance program. It is  not intended to diagnose MRSA infection nor to guide or monitor treatment for MRSA infections. RESULT CALLED TO, READ BACK BY AND VERIFIED WITHGeradine Girt RN 09/08/18 1932 JDW Performed at [facility], [address] [place], [place] [address]     Radiology Reports Dg Chest 2  View  Result Date: 08/23/2018 CLINICAL DATA:  Hypoxia EXAM: CHEST - 2 VIEW COMPARISON:  08/15/2018 FINDINGS: Cardiac shadow is mildly prominent. Lungs are well aerated bilaterally with mild bibasilar chronic scarring stable from the previous exam. Vascular stents on the left are again seen and stable. No new bony abnormality is noted. IMPRESSION: Bibasilar scarring.  No acute abnormality noted. Electronically Signed   By: Inez Catalina M.D.   On: 08/23/2018 12:18   Dg Chest 2 View  Result Date: 08/15/2018 CLINICAL DATA:  Fever. Left foot infection. History of diabetes, end-stage renal disease and pneumonia. EXAM: CHEST - 2 VIEW COMPARISON:  Radiographs 05/12/2018 and 05/02/2018. CT 05/02/2018 and 11/09/2017. FINDINGS: The heart size and mediastinal contours are stable. The patient has known chronic adenopathy in the right paratracheal and subcarinal regions which appears stable. Vascular stents are noted in the left subclavian and axillary vessels. Patchy right perihilar and lower lobe airspace opacities appear largely chronic, not significantly changed over the recent prior studies. The left lung is clear. There is no pleural effusion or pneumothorax. No acute osseous findings. IMPRESSION: No significant change in the appearance of the chest compared with prior studies obtained over the last 8 months. The right basilar airspace opacity appears chronic and may reflect postinflammatory scarring or chronic atypical inflammation. Grossly stable mediastinal adenopathy. Electronically Signed   By: Richardean Sale M.D.   On: 08/15/2018 16:13   Ct Head Wo Contrast  Result Date: 08/15/2018 CLINICAL DATA:  Altered mental status  EXAM: CT HEAD WITHOUT CONTRAST TECHNIQUE: Contiguous axial images were obtained from the base of the skull through the vertex without intravenous contrast. COMPARISON:  Head CT 11/06/2017 FINDINGS: Brain: No acute intracranial hemorrhage. Unchanged appearance of right parietal, left occipital and right cerebellar infarcts. No midline shift or other mass effect. Bilateral basal ganglia mineralization. Vascular: No abnormal hyperdensity of the major intracranial arteries or dural venous sinuses. No intracranial atherosclerosis. Skull: The visualized skull base, calvarium and extracranial soft tissues are normal. Sinuses/Orbits: No fluid levels or advanced mucosal thickening of the visualized paranasal sinuses. No mastoid or middle ear effusion. The orbits are normal. IMPRESSION: 1. No acute abnormality. 2. Unchanged appearance of left occipital, right parietal and right cerebellar infarcts. Electronically Signed   By: Ulyses Jarred M.D.   On: 08/15/2018 21:48   Portable Chest Xray  Result Date: 09/09/2018 CLINICAL DATA:  Intubation. EXAM: PORTABLE CHEST 1 VIEW COMPARISON:  09/08/2018 FINDINGS: Endotracheal tube terminates 4.1 cm from the carina. Enteric catheter within the expected location of the stomach. Cardiomediastinal silhouette is normal. Mediastinal contours appear intact. There is no evidence of pneumothorax. Bilateral basilar peribronchial infiltrates have slightly improved. Osseous structures are without acute abnormality. Soft tissues are grossly normal. IMPRESSION: 1. Support apparatus as described. 2. Slightly improved bilateral basilar peribronchial infiltrates. Electronically Signed   By: Fidela Salisbury M.D.   On: 09/09/2018 14:31   Dg Chest Port 1 View  Result Date: 09/08/2018 CLINICAL DATA:  Endotracheal tube placement. Acute respiratory failure. EXAM: PORTABLE CHEST 1 VIEW COMPARISON:  08/23/2018 and 08/15/2018 and 05/12/2018 FINDINGS: Endotracheal tube is in the proximal right mainstem  bronchus and needs to be retracted approximately 2.5 cm. NG tube tip is in the fundus of the stomach with the side hole at the GE junction. There is bibasilar atelectasis or hazy infiltrate which appears minimally progressed since the prior study of 08/23/2018. This is probably accentuated by the shallow inspiration. Vascular stent in the left subclavian vein. No acute bone abnormality. IMPRESSION:  ETT in the right mainstem bronchus. Bibasilar infiltrates/atelectasis, unchanged. Critical Value/emergent results were called by telephone at the time of interpretation on 09/08/2018 at 3:43 pm to Georges Lynch, RN , who verbally acknowledged these results. Electronically Signed   By: Lorriane Shire M.D.   On: 09/08/2018 15:46   Dg Foot Complete Left  Result Date: 08/15/2018 CLINICAL DATA:  Fever. Left foot infection. History of diabetes, end-stage renal disease and pneumonia. EXAM: LEFT FOOT - COMPLETE 3+ VIEW COMPARISON:  None. FINDINGS: There is no evidence of acute fracture or dislocation. There are possible old healed fractures of the 4th and 5th metatarsal necks. There are minimal degenerative changes at the 1st metatarsophalangeal joint. No erosive changes or bone destruction identified. There are prominent vascular calcifications consistent with diabetes. The soft tissues are diffusely prominent without focal swelling or foreign body. IMPRESSION: No acute osseous findings or evidence of osteomyelitis. Prominent vascular calcifications. Electronically Signed   By: Richardean Sale M.D.   On: 08/15/2018 16:15    Lab Data:  CBC: Recent Labs  Lab 09/08/18 1336 09/08/18 1440 09/09/18 0330 09/10/18 0506 09/11/18 0422  WBC  --  8.6 7.2 6.0 5.9  NEUTROABS  --  6.6  --  3.9 3.3  HGB 8.2* 7.6* 7.3* 7.4* 7.4*  HCT 24.0* 24.8* 24.1* 24.7* 24.4*  MCV  --  98.8 96.8 97.2 99.2  PLT  --  201 209 219 939   Basic Metabolic Panel: Recent Labs  Lab 09/08/18 0934  09/08/18 1336 09/08/18 1440  09/09/18 0330 09/09/18 2316 09/11/18 0422  NA 137   < > 138 138 136 136 133*  K 4.2   < > 3.5 3.7 4.0 4.3 3.9  CL  --   --   --  100 99 98 96*  CO2  --   --   --  23 22 25 25   GLUCOSE 182*  --   --  205* 100* 157* 181*  BUN  --   --   --  52* 57* 35* 48*  CREATININE  --   --   --  10.12* 11.15* 7.84* 10.67*  CALCIUM  --   --   --  7.9* 8.3* 8.2* 8.2*  MG  --   --   --  2.1  --   --   --   PHOS  --   --   --  5.4*  --   --   --    < > = values in this interval not displayed.   GFR: Estimated Creatinine Clearance: 9 mL/min (A) (by C-G formula based on SCr of 10.67 mg/dL (H)). Liver Function Tests: Recent Labs  Lab 09/09/18 0330 09/10/18 0506  AST 15 16  ALT 9 7  ALKPHOS 96 102  BILITOT 1.2 1.1  PROT 7.0 7.1  ALBUMIN 2.2* 2.2*   No results for input(s): LIPASE, AMYLASE in the last 168 hours. No results for input(s): AMMONIA in the last 168 hours. Coagulation Profile: Recent Labs  Lab 09/10/18 0506  INR 1.3*   Cardiac Enzymes: Recent Labs  Lab 09/08/18 1440 09/08/18 2147 09/09/18 0330 09/10/18 0506  TROPONINI 0.04* 0.06* 0.06* 0.05*   BNP (last 3 results) No results for input(s): PROBNP in the last 8760 hours. HbA1C: Recent Labs    09/10/18 1145  HGBA1C 6.4*   CBG: Recent Labs  Lab 09/10/18 1211 09/10/18 1623 09/10/18 1950 09/10/18 2121 09/11/18 0657  GLUCAP 108* 130* 177* 144* 158*   Lipid Profile: Recent Labs  09/09/18 0330  TRIG 172*   Thyroid Function Tests: No results for input(s): TSH, T4TOTAL, FREET4, T3FREE, THYROIDAB in the last 72 hours. Anemia Panel: No results for input(s): VITAMINB12, FOLATE, FERRITIN, TIBC, IRON, RETICCTPCT in the last 72 hours. Urine analysis:    Component Value Date/Time   COLORURINE RED (A) 11/19/2016 0020   APPEARANCEUR TURBID (A) 11/19/2016 0020   LABSPEC  11/19/2016 0020    TEST NOT REPORTED DUE TO COLOR INTERFERENCE OF URINE PIGMENT   PHURINE  11/19/2016 0020    TEST NOT REPORTED DUE TO COLOR  INTERFERENCE OF URINE PIGMENT   GLUCOSEU (A) 11/19/2016 0020    TEST NOT REPORTED DUE TO COLOR INTERFERENCE OF URINE PIGMENT   HGBUR (A) 11/19/2016 0020    TEST NOT REPORTED DUE TO COLOR INTERFERENCE OF URINE PIGMENT   BILIRUBINUR (A) 11/19/2016 0020    TEST NOT REPORTED DUE TO COLOR INTERFERENCE OF URINE PIGMENT   KETONESUR (A) 11/19/2016 0020    TEST NOT REPORTED DUE TO COLOR INTERFERENCE OF URINE PIGMENT   PROTEINUR (A) 11/19/2016 0020    TEST NOT REPORTED DUE TO COLOR INTERFERENCE OF URINE PIGMENT   NITRITE (A) 11/19/2016 0020    TEST NOT REPORTED DUE TO COLOR INTERFERENCE OF URINE PIGMENT   LEUKOCYTESUR (A) 11/19/2016 0020    TEST NOT REPORTED DUE TO COLOR INTERFERENCE OF URINE PIGMENT     Tru Leopard M.D. [facility] 09/11/2018, 11:10 AM  Pager: [phone] Between 7am to 7pm - call Pager - [phone]  After 7pm go to [email or web address] - password [email or web address]  Call night coverage person covering after 7pm

## 2018-09-11 NOTE — Progress Notes (Signed)
Renal Navigator notified OP HD clinic/SW of patient's admission and negative COVID 19 rapid test result to provide continuity of care and safety.  Alphonzo Cruise, [place] [facility] Navigator [phone]

## 2018-09-11 NOTE — Progress Notes (Signed)
Patient ID: Howard Bell, male   DOB: May 14, 1965, 53 y.o.   MRN: [id number] Patient is status post left above-the-knee amputation postoperative day 3.  50 cc out of the wound VAC canister no additional drainage.  Anticipate transfer back to the floor soon and discharge to skilled nursing.

## 2018-09-11 NOTE — NC FL2 (Signed)
[place] LEVEL OF CARE SCREENING TOOL     IDENTIFICATION  Patient Name: Howard Bell Birthdate: 06/07/1965 Sex: male Admission Date (Current Location): 09/08/2018  [facility] and [place] Number:  Herbalist and Address:         Provider Number: [id number]  Attending Physician Name and Address:  Newt Minion, MD  Relative Name and Phone Number:  Wess Baney, spouse,    [phone]    Current Level of Care: Hospital Recommended Level of Care: [place] Prior Approval Number:    Date Approved/Denied:   PASRR Number:    Discharge Plan: SNF    Current Diagnoses: Patient Active Problem List   Diagnosis Date Noted  . Status post bilateral above knee amputation ([place]) 09/08/2018  . Acute respiratory failure with hypercapnia ([place])   . Atherosclerosis of native arteries of extremities with gangrene, left leg ([place]) 09/05/2018  . Hypoxia   . ESRD (end stage renal disease) ([place])   . Labile blood glucose   . Postoperative pain   . Dry gangrene (HCC)--left foot/left shin   . SIRS (systemic inflammatory response syndrome) (HCC)   . Poorly controlled type 2 diabetes mellitus with peripheral neuropathy ([place])   . Peripheral arterial disease ([place])   . Unilateral AKA, right ([place]) 08/17/2018  . Sepsis ([place]) 08/15/2018  . Acute metabolic encephalopathy 01/74/9449  . Gangrene of left foot ([place])   . Hypoglycemia   . Drug induced constipation   . Confusion, postoperative   . Labile blood pressure   . Uncontrolled diabetes mellitus type 2 with peripheral artery disease ([place])   . Anemia of chronic disease   . Diabetes mellitus type 2 in nonobese (HCC)   . Supplemental oxygen dependent   . Unilateral AKA, left ([place]) 08/07/2018  . S/P BKA (below knee amputation), right ([place]) 08/04/2018  . Severe protein-calorie malnutrition ([place]) 08/01/2018  . Dehiscence of amputation stump ([place]) 08/01/2018  . Cutaneous abscess of left foot 08/01/2018   . Cellulitis of right lower extremity   . Influenza A 05/12/2018  . Pneumonia 05/12/2018  . Gangrene of right foot ([place])   . Critical limb ischemia with history of revascularization of same extremity 05/09/2018  . LUQ pain   . 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 ([place]) 11/16/2017  . CKD stage 5 due to type 2 diabetes mellitus ([place]) 11/16/2017  . Glaucoma due to type 2 diabetes mellitus ([place]) 11/16/2017  . Chronic generalized pain 11/16/2017  . Recurrent pneumonia 11/06/2017  . Diabetic gastroparesis ([place]) 11/06/2017  . Generalized abdominal pain 09/13/2017  . Acute pain of right shoulder 07/26/2017  . Acute bursitis of right shoulder 07/26/2017  . Left arm swelling 03/28/2017  . ESRD on dialysis ([place]) 03/28/2017  . Chest pain 11/26/2016  . Essential hypertension 11/26/2016  . Anemia due to end stage renal disease ([place]) 08/04/2016  . Acute respiratory failure with hypoxemia ([place]) 08/04/2016  . Type 2 diabetes mellitus with foot ulcer ([place]) 08/04/2016  . Acute CHF (congestive heart failure) ([place]) 08/04/2016  . Elevated troponin     Orientation RESPIRATION BLADDER Height & Weight     Self, Time, Situation, Place  Normal Continent Weight: 197 lb 15.6 oz (89.8 kg) Height:  6' (182.9 cm)  BEHAVIORAL SYMPTOMS/MOOD NEUROLOGICAL BOWEL NUTRITION STATUS      Continent Diet(Diet renal/carb modified with fluid restriction : 1200 mL  Fluid)  AMBULATORY STATUS COMMUNICATION OF NEEDS Skin   Extensive Assist Verbally Surgical wounds, Wound Vac(wound vac on left thigh, closed incision right leg no dressing,open wound diabetic ulcer on right hand)                       Personal Care Assistance Level of Assistance  Bathing, Dressing, Feeding Bathing Assistance: Maximum assistance Feeding assistance: Independent Dressing Assistance: Maximum assistance      Functional Limitations Info  Sight, Hearing, Speech Sight Info: Adequate Hearing Info: Adequate Speech Info: Adequate    SPECIAL CARE FACTORS FREQUENCY  PT (By licensed PT), OT (By licensed OT)     PT Frequency: 5x OT Frequency: 5x            Contractures Contractures Info: Not present    Additional Factors Info  Code Status, Allergies, Insulin Sliding Scale Code Status Info: Full Code Allergies Info: NO known allergies   Insulin Sliding Scale Info: insulin aspart (novoLOG) injection 0-5 Units, 3 times daily with meals       Current Medications (09/11/2018):  This is the current hospital active medication list Current Facility-Administered Medications  Medication Dose Route Frequency Provider Last Rate Last Dose  . 0.9 %  sodium chloride infusion   Intravenous PRN Rai, Vernelle Emerald, MD   Stopped at 09/10/18 1325  . 0.9 %  sodium chloride infusion  100 mL Intravenous PRN Rai, Ripudeep K, MD      . 0.9 %  sodium chloride infusion  100 mL Intravenous PRN Rai, Ripudeep K, MD      . albuterol (PROVENTIL) (2.5 MG/3ML) 0.083% nebulizer solution 2.5 mg  2.5 mg Nebulization Q4H PRN Rai, Ripudeep K, MD      . alteplase (CATHFLO ACTIVASE) injection 2 mg  2 mg Intracatheter Once PRN Rai, Ripudeep K, MD      . amLODipine (NORVASC) tablet 5 mg  5 mg Oral Daily Rai, Ripudeep K, MD      . bisacodyl (DULCOLAX) suppository 10 mg  10 mg Rectal Daily PRN Rai, Ripudeep K, MD      . brimonidine (ALPHAGAN) 0.2 % ophthalmic solution 1 drop  1 drop Both Eyes BID Rai, Ripudeep K, MD   1 drop at 09/11/18 0842  . calcium acetate (PHOSLO) capsule 1,334 mg  1,334 mg Oral TID WC Madelon Lips, MD   1,334 mg at 09/11/18 0840  . chlorhexidine (PERIDEX) 0.12 % solution 15 mL  15 mL Mouth Rinse BID Rai, Ripudeep K, MD      . chlorhexidine gluconate (MEDLINE KIT) (PERIDEX) 0.12 % solution 15 mL  15 mL Mouth Rinse BID Rai, Ripudeep K, MD   15 mL at 09/11/18 0840  . Chlorhexidine Gluconate Cloth 2 % PADS 6  each  6 each Topical Q0600 Rai, Vernelle Emerald, MD   6 each at 09/11/18 [phone]  . Chlorhexidine Gluconate Cloth 2 % PADS 6 each  6 each Topical Daily Rai, Ripudeep K, MD   6 each at 09/10/18 1047  . docusate sodium (COLACE) capsule 100 mg  100 mg Oral BID Rai, Ripudeep K, MD   100 mg at 09/11/18 0840  . feeding supplement (NEPRO CARB STEADY) liquid 237 mL  237 mL Oral Q24H Newt Minion, MD      . feeding supplement (PRO-STAT SUGAR FREE 64) liquid 30 mL  30 mL Oral BID Newt Minion, MD      . Derrill Memo ON 09/13/2018] ferric gluconate (NULECIT) 62.5 mg in  sodium chloride 0.9 % 100 mL IVPB  62.5 mg Intravenous Q Wed-HD Rai, Ripudeep K, MD      . fluticasone (FLONASE) 50 MCG/ACT nasal spray 1 spray  1 spray Each Nare Daily Rai, Ripudeep K, MD   1 spray at 09/11/18 0842  . heparin injection 1,000 Units  1,000 Units Dialysis PRN Rai, Ripudeep K, MD      . heparin injection 5,000 Units  5,000 Units Subcutaneous Q8H Rai, Ripudeep K, MD   5,000 Units at 09/10/18 1322  . hydrALAZINE (APRESOLINE) injection 10 mg  10 mg Intravenous Q4H PRN Rai, Ripudeep K, MD   10 mg at 09/09/18 2919  . HYDROmorphone (DILAUDID) 1 MG/ML injection           . HYDROmorphone (DILAUDID) injection 1 mg  1 mg Intravenous Q2H PRN Rai, Ripudeep K, MD   1 mg at 09/11/18 1458  . insulin aspart (novoLOG) injection 0-5 Units  0-5 Units Subcutaneous QHS Rai, Ripudeep K, MD      . insulin aspart (novoLOG) injection 0-9 Units  0-9 Units Subcutaneous TID WC Rai, Ripudeep K, MD   2 Units at 09/11/18 0839  . lidocaine (PF) (XYLOCAINE) 1 % injection 5 mL  5 mL Intradermal PRN Rai, Ripudeep K, MD      . lidocaine-prilocaine (EMLA) cream 1 application  1 application Topical PRN Rai, Ripudeep K, MD      . MEDLINE mouth rinse  15 mL Mouth Rinse q12n4p Rai, Ripudeep K, MD   15 mL at 09/10/18 1947  . metoCLOPramide (REGLAN) tablet 5 mg  5 mg Oral TID AC Rai, Ripudeep K, MD   5 mg at 09/11/18 0841  . metoprolol succinate (TOPROL-XL) 24 hr tablet 50 mg  50 mg  Oral Daily Rai, Ripudeep K, MD   50 mg at 09/11/18 0839  . multivitamin (RENA-VIT) tablet 1 tablet  1 tablet Oral QHS Newt Minion, MD      . mupirocin ointment (BACTROBAN) 2 %   Nasal BID Newt Minion, MD      . ondansetron [facility]) tablet 4 mg  4 mg Oral Q6H PRN Rai, Ripudeep K, MD       Or  . ondansetron (ZOFRAN) injection 4 mg  4 mg Intravenous Q6H PRN Rai, Ripudeep K, MD      . pantoprazole (PROTONIX) EC tablet 40 mg  40 mg Oral Q0600 Rai, Ripudeep K, MD   40 mg at 09/11/18 1660  . pentafluoroprop-tetrafluoroeth (GEBAUERS) aerosol 1 application  1 application Topical PRN Rai, Ripudeep K, MD      . timolol (TIMOPTIC) 0.5 % ophthalmic solution 1 drop  1 drop Both Eyes BID Newt Minion, MD   1 drop at 09/11/18 6004     Discharge Medications: Please see discharge summary for a list of discharge medications.  Relevant Imaging Results:  Relevant Lab Results:   Additional Information [id number]  Gerrianne Scale Ezzard Ditmer, LCSW

## 2018-09-11 NOTE — Progress Notes (Signed)
Nutrition Follow-up  DOCUMENTATION CODES:   Not applicable  INTERVENTION:    30 ml Prostat BID, each supplement provides 100 kcals and 15 grams protein.   Nepro Shake po once daily, each supplement provides 425 kcal and 19 grams protein  Renal MVI daily  NUTRITION DIAGNOSIS:   Increased nutrient needs related to post-op healing as evidenced by estimated needs.  Ongoing  GOAL:   Patient will meet greater than or equal to 90% of their needs  Met PO  MONITOR:   Weight trends, Vent status, Labs, Skin, I & O's  REASON FOR ASSESSMENT:   Ventilator    ASSESSMENT:   53 year old male who presented on 6/19 for left AKA due to gangrene of left foot an ankle. 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   6/19 - s/p left AKA and placement of wound VAC 6/20- extubated   RD working remotely.  Last HD 6/20- 1030 ml net UF documented   Attempted to speak with pt via phone, no answer. Plan for HD today. Meal completions charted as 100% for pt's last meal. Will provide pt with Prostat to maximize protein and trial Nepro.   EDW: 93.2 kg (prior to L BKA?, Nephrology to adjust) Current weight: 89.4 kg   No edema observed per nursing assessment.   I/O: +1,174 ml since admit UOP: none recorded VAC: 25 ml x 24 hrs   Medications: phoslo, colace, SS novolog, 5 mg reglan TID Labs: Na 133 (L) CBG 108-177  Diet Order:   Diet Order            Diet renal/carb modified with fluid restriction Diet-HS Snack? Nothing; Fluid restriction: 1200 mL Fluid; Room service appropriate? Yes; Fluid consistency: Thin  Diet effective now              EDUCATION NEEDS:   No education needs have been identified at this time  Skin:  Skin Assessment: Skin Integrity Issues: Skin Integrity Issues:: Wound VAC, Incisions, Other (Comment) Wound Vac: L AKA Incisions: R AKA Other: Non-healing wound R hand (5th digit)  Last BM:  PTA  Height:   Ht Readings from Last  1 Encounters:  09/09/18 6' (1.829 m)    Weight:   Wt Readings from Last 1 Encounters:  09/09/18 89.4 kg    Ideal Body Weight:  68 kg(adjusted for bilateral AKAs)  BMI:  Body mass index is 26.73 kg/m.  Estimated Nutritional Needs:   Kcal:  2050-2250 kcal  Protein:  100-120 grams  Fluid:  >/= 2 L/day   Mariana Single RD, LDN Clinical Nutrition Pager # - [phone]

## 2018-09-11 NOTE — H&P (Signed)
CM acknowledges home BIPAP consult of "not working".  CM contacted pts wife and was informed that the CPAP (not BIPAP) was never returned to them when pt discharged from [place] to home with her on 09/06/18.  CM instructed wife to contact [place] immediately so she can arrange to pick up equipment. Per wife the equipment was working fine when she took it to [facility] the day of pts admit.

## 2018-09-12 ENCOUNTER — Encounter: Payer: Medicare Other | Attending: Registered Nurse | Admitting: Registered Nurse

## 2018-09-12 LAB — BASIC METABOLIC PANEL
Anion gap: 10 (ref 5–15)
BUN: 30 mg/dL — ABNORMAL HIGH (ref 6–20)
CO2: 27 mmol/L (ref 22–32)
Calcium: 8.5 mg/dL — ABNORMAL LOW (ref 8.9–10.3)
Chloride: 96 mmol/L — ABNORMAL LOW (ref 98–111)
Creatinine, Ser: 7.28 mg/dL — ABNORMAL HIGH (ref 0.61–1.24)
GFR calc Af Amer: 9 mL/min — ABNORMAL LOW (ref >60)
GFR calc non Af Amer: 8 mL/min — ABNORMAL LOW (ref >60)
Glucose, Bld: 121 mg/dL — ABNORMAL HIGH (ref 70–99)
Potassium: 3.8 mmol/L (ref 3.5–5.1)
Sodium: 133 mmol/L — ABNORMAL LOW (ref 135–145)

## 2018-09-12 LAB — CBC WITH DIFFERENTIAL/PLATELET
Abs Immature Granulocytes: 0.03 10*3/uL (ref 0.00–0.07)
Basophils Absolute: 0 10*3/uL (ref 0.0–0.1)
Basophils Relative: 1 %
Eosinophils Absolute: 0.4 10*3/uL (ref 0.0–0.5)
Eosinophils Relative: 5 %
HCT: 25.9 % — ABNORMAL LOW (ref 39.0–52.0)
Hemoglobin: 8 g/dL — ABNORMAL LOW (ref 13.0–17.0)
Immature Granulocytes: 0 %
Lymphocytes Relative: 20 %
Lymphs Abs: 1.4 10*3/uL (ref 0.7–4.0)
MCH: 30.8 pg (ref 26.0–34.0)
MCHC: 30.9 g/dL (ref 30.0–36.0)
MCV: 99.6 fL (ref 80.0–100.0)
Monocytes Absolute: 0.8 10*3/uL (ref 0.1–1.0)
Monocytes Relative: 12 %
Neutro Abs: 4.3 10*3/uL (ref 1.7–7.7)
Neutrophils Relative %: 62 %
Platelets: 213 10*3/uL (ref 150–400)
RBC: 2.6 MIL/uL — ABNORMAL LOW (ref 4.22–5.81)
RDW: 19.9 % — ABNORMAL HIGH (ref 11.5–15.5)
WBC: 6.8 10*3/uL (ref 4.0–10.5)
nRBC: 0 % (ref 0.0–0.2)

## 2018-09-12 LAB — GLUCOSE, CAPILLARY
Glucose-Capillary: 97 mg/dL (ref 70–99)
Glucose-Capillary: 157 mg/dL — ABNORMAL HIGH (ref 70–99)
Glucose-Capillary: 174 mg/dL — ABNORMAL HIGH (ref 70–99)
Glucose-Capillary: 157 mg/dL — ABNORMAL HIGH (ref 70–99)

## 2018-09-12 MED ORDER — MENTHOL 3 MG MT LOZG
1.0000 | LOZENGE | OROMUCOSAL | Status: DC | PRN
  Filled 2018-09-12: qty 9

## 2018-09-12 MED ORDER — CHLORHEXIDINE GLUCONATE CLOTH 2 % EX PADS
6.0000 | MEDICATED_PAD | Freq: Every day | CUTANEOUS | Status: DC
  Administered 2018-09-13 – 2018-09-14 (×2): 6 via TOPICAL

## 2018-09-12 MED ORDER — AMLODIPINE BESYLATE 10 MG PO TABS
10.0000 mg | ORAL_TABLET | Freq: Every day | ORAL | Status: DC
  Administered 2018-09-13 – 2018-09-16 (×3): 10 mg via ORAL
  Filled 2018-09-12 (×3): qty 1

## 2018-09-12 MED ORDER — AMLODIPINE BESYLATE 5 MG PO TABS
5.0000 mg | ORAL_TABLET | Freq: Once | ORAL | Status: AC
  Administered 2018-09-12: 5 mg via ORAL
  Filled 2018-09-12: qty 1

## 2018-09-12 NOTE — Progress Notes (Signed)
[facility] Progress Note   Subjective:    No new c/o's.  Doesn't have much to say.    Objective:   BP (!) 156/56 (BP Location: Right Arm)   Pulse 79   Temp 98.4 F (36.9 C) (Oral)   Resp 16   Ht 6' (1.829 m)   Wt 89.8 kg   SpO2 97%   BMI 26.85 kg/m   Physical Exam: General: sitting in bed, on [place] O2 Head: NCAT sclera not icteric  Lungs: normal WOB, clear bilat Heart: RRR with S1 S2.  Abdomen: soft NT + BS Lower extremities: left AKA with wound vac - right AKA with staples  Neuro: AAO,  Dialysis Access: left AVGG + bruit and thrill  Dialysis: AF MWF   4h 46mn  4 50/800  93kg  2/2.25 bath  Hep 9000  L AVG - parsabiv 10, iPTH 640 6/17  - Hb 8 at dc 6/10 on home O2 - Mircera 200 given 6/16 - tsat after last hosp d/c 37%  - had been off Parsabiv during recent hospitalization    Assessment/ Plan:     Assessment/Plan: 1. 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 Then went to CIR, then came back for AMS. Then went back to CIR. Then underwent L AKA 09/08/18 for multiple wounds not responding to Rx.  So now is bilat AKA.  2. Acute hypoxic respiratory failure: required vent post surgery.  Extubated on 6/20, doing well now.  3. ESRD - MWF HD. HD tomorrow 4. Hypertension/volume  - BP's good, 7kg under prior edw which is approp for bilat AKA.  5.  Anemia  - received 200 Mircera 6/16; Hgb 7.3, follow and add back ESA as appropriate 6.  Metabolic bone disease -  Holding parsabiv while here due to unavailability; resuming binders now as eating (phoslo) 7.  Nutrition - chronic low alb. 8.  DM - per primary    RKelly Splinter MD 09/12/2018, 2:16 PM    BMET Recent Labs  Lab 09/08/18 0934 09/08/18 1301 09/08/18 1336 09/08/18 1440 09/09/18 0330 09/09/18 2316 09/11/18 0422 09/12/18 0301  NA 137 137 138 138 136 136 133* 133*  K 4.2 4.1 3.5 3.7 4.0 4.3 3.9 3.8  CL  --   --   --  100  99 98 96* 96*  CO2  --   --   --  _0 GLUCOSE 182*  --   --  205* 100* 157* 181* 121*  BUN  --   --   --  52* 57* 35* 48* 30*  CREATININE  --   --   --  10.12* 11.15* 7.84* 10.67* 7.28*  CALCIUM  --   --   --  7.9* 8.3* 8.2* 8.2* 8.5*  PHOS  --   --   --  5.4*  --   --   --   --    CBC Recent Labs  Lab 09/08/18 1440 09/09/18 0330 09/10/18 0506 09/11/18 0422 09/12/18 0301  WBC 8.6 7.2 6.0 5.9 6.8  NEUTROABS 6.6  --  3.9 3.3 4.3  HGB 7.6* 7.3* 7.4* 7.4* 8.0*  HCT 24.8* 24.1* 24.7* 24.4* 25.9*  MCV 98.8 96.8 97.2 99.2 99.6  PLT 201 209 219 204 213    _1 @ Medications:    . [START ON 09/13/2018] amLODipine  10 mg Oral Daily  . brimonidine  1  drop Both Eyes BID  . calcium acetate  1,334 mg Oral TID WC  . chlorhexidine  15 mL Mouth Rinse BID  . chlorhexidine gluconate (MEDLINE KIT)  15 mL Mouth Rinse BID  . Chlorhexidine Gluconate Cloth  6 each Topical Q0600  . Chlorhexidine Gluconate Cloth  6 each Topical Daily  . docusate sodium  100 mg Oral BID  . feeding supplement (NEPRO CARB STEADY)  237 mL Oral Q24H  . feeding supplement (PRO-STAT SUGAR FREE 64)  30 mL Oral BID  . fluticasone  1 spray Each Nare Daily  . heparin injection (subcutaneous)  5,000 Units Subcutaneous Q8H  . insulin aspart  0-5 Units Subcutaneous QHS  . insulin aspart  0-9 Units Subcutaneous TID WC  . mouth rinse  15 mL Mouth Rinse q12n4p  . metoCLOPramide  5 mg Oral TID AC  . metoprolol succinate  50 mg Oral Daily  . multivitamin  1 tablet Oral QHS  . mupirocin ointment   Nasal BID  . pantoprazole  40 mg Oral Q0600  . timolol  1 drop Both Eyes BID

## 2018-09-12 NOTE — Progress Notes (Signed)
Patient ID: Howard Bell, male   DOB: [birth year], 53 y.o.   MRN: [id number] Patient is alert and comfortable this morning.  Total of 50 cc in the wound VAC canister.  Anticipate discharging to skilled nursing.  Postop day 4.

## 2018-09-12 NOTE — Consult Note (Signed)
   [facility] CM Inpatient Consult   09/12/2018  Howard Bell [birth year] [id number]    Patient screened for potential [facility] care management services needed under his Medicare/ [employer] plan, with noted readmission within 30 days, 6 hospitalizations and 1 ED visit in the past 6 months; and has42% extreme high risk score for unplanned readmission.  Per chartreview andMDnotedated 6/20/20show as follows: 53 year old male with history of OSA and chronic hypoxic respiratory failure on 2L O2, and 3L at night, ESRD - HD dependent MWF, PAD s/p R BKA 05/14/2018, HTN, DMT2, anemia, diabetic gastroparesis, and depression- admitted 6/19 to Dr. Sharol Given for elective left AKA after failed medical management of gangrene to left lower leg and foot.   Reportedly last had dialysis on 09/06/2018, with next dialysis on Saturday 6/20. Surgery was without complication with estimated blood loss of 200 ml with wound vac applied.  Anesthesia was reversed however patient did not wake up, then became reportedly hypoxic then hypotensive requiring intermittent vasopressor support and endotracheal intubation. (requiring ICU stay)  [facility], with [facility] at [facility], listed to provide transition of care.  Review of medical records indicate of likely disposition to skilled nursing facility for rehab (skilled nursing facility).   Will follow for progress anddisposition.Ifthere are any disposition changes,and needs for appropriate community follow-up,please referto [facility] care management.  Of note, [facility] Care Management services does not replace or interfere with any services that are arranged by transition of care case management or social work.   For questions and additional information, please call:  Anneth Brunell A. Shavon Ashmore, BSN, RN-BC [facility] Liaison Cell: [phone]

## 2018-09-12 NOTE — Plan of Care (Signed)
  Problem: Clinical Measurements: Goal: Ability to maintain clinical measurements within normal limits will improve Outcome: Progressing Goal: Diagnostic test results will improve Outcome: Progressing Goal: Respiratory complications will improve Outcome: Progressing Goal: Cardiovascular complication will be avoided Outcome: Progressing   Problem: Activity: Goal: Risk for activity intolerance will decrease Outcome: Progressing   Problem: Nutrition: Goal: Adequate nutrition will be maintained Outcome: Progressing   Problem: Coping: Goal: Level of anxiety will decrease Outcome: Progressing   Problem: Pain Managment: Goal: General experience of comfort will improve Outcome: Progressing   Problem: Safety: Goal: Ability to remain free from injury will improve Outcome: Progressing   Problem: Skin Integrity: Goal: Risk for impaired skin integrity will decrease Outcome: Progressing

## 2018-09-12 NOTE — Progress Notes (Signed)
[facility] Consult progress note                                                                              Patient Demographics  Howard Bell, is a 53 y.o. male, DOB - May 20, 1965, PCH:[id number]  Admit date - 09/08/2018   Admitting Physician Newt Minion, MD  Outpatient Primary MD for the patient is Sagardia, Ines Bloomer, MD  Outpatient specialists:   LOS - 4  days   Medical records reviewed and are as summarized below:    No chief complaint on file.      Brief summary   Patient is a 53 year old male with history of OSA and chronic hypoxic respiratory failure on 2L O2, and 3L at night, ESRD - HD dependent MWF, PAD s/p R BKA 05/14/2018, HTN, DMT2, anemia, diabetic gastroparesis and depression was admitted 6/19 to Dr. Sharol Given for elective left AKA after failed medical management of gangrene to left lower leg and foot.   Patientreportedly last had dialysis on 09/06/2018.  Plans were for patient to have dialysis next on Saturday 6/20.  Patient was taken to the OR, surgery was without complication, estimated blood loss 200 cc with wound VAC applied. Anesthesia was reversed however patient did not wake up, then became reportedly hypoxic, then hypotensive requiring intermittent vasopressor support and endotracheal intubation.  Patient was transferred to ICU under PCCM care.  Patient was extubated on 6/20, off the vasopressors and Precedex drip.  CCM requested [facility] to assume care as consultants.   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 -Patient reports that he does have BiPAP nightly at home which is not working.  Case management consulted  for the new machine -No complaints, O2 sats 100% on 3 L, at baseline  Acute metabolic encephalopathy -Resolved, alert and oriented x3, off the Precedex drip.     Hypertension -Postop patient had hypotension, needed vasopressors -2D echo 6/16 had shown normal EF 60 to 65%. -Mild troponin elevation likely due to demand ischemia, acute respiratory failure, hypotension -BP still elevated, started Toprol and Norvasc increased to 10 mg daily  History of PAD/PVD -Management as #1  ESRD on hemodialysis, MWF -Nephrology consulted, receiving HD per schedule  History of gastroparesis -Continue Reglan  GERD -Continue PPI  Diabetes mellitus type 2, with renal complications, ESRD -Change to sliding scale insulin sensitive, q. before meals and at bedtime -Hemoglobin A1c 7.5 in 07/2018    Code Status: Full code DVT Prophylaxis: Heparin Family Communication: Discussed in detail with the patient, all imaging results, lab results explained to the patient    Disposition Plan: Transfer to progressive floor for BiPAP at night, likely disposition to skilled nursing facility for rehab  Time Spent in minutes 25 minutes  Procedures:  Hemodialysis Intubation, extubation LEFT ABOVE KNEE AMPUTATION, Application Of  Wound Vac   Consultants:   Primary orthopedics CCM  Antimicrobials:   Anti-infectives (From admission, onward)   Start     Dose/Rate Route Frequency Ordered Stop   09/08/18 0845  ceFAZolin (ANCEF) IVPB 2g/100 mL premix     2 g 200 mL/hr over 30 Minutes Intravenous On call to O.R. 09/08/18 0831 09/08/18 1151         Medications  Scheduled Meds: . amLODipine  5 mg Oral Daily  . brimonidine  1 drop Both Eyes BID  . calcium acetate  1,334 mg Oral TID WC  . chlorhexidine  15 mL Mouth Rinse BID  . chlorhexidine gluconate (MEDLINE KIT)  15 mL Mouth Rinse BID  . Chlorhexidine Gluconate Cloth  6 each Topical Q0600  . Chlorhexidine Gluconate Cloth  6 each Topical Daily  .  docusate sodium  100 mg Oral BID  . feeding supplement (NEPRO CARB STEADY)  237 mL Oral Q24H  . feeding supplement (PRO-STAT SUGAR FREE 64)  30 mL Oral BID  . fluticasone  1 spray Each Nare Daily  . heparin injection (subcutaneous)  5,000 Units Subcutaneous Q8H  . insulin aspart  0-5 Units Subcutaneous QHS  . insulin aspart  0-9 Units Subcutaneous TID WC  . mouth rinse  15 mL Mouth Rinse q12n4p  . metoCLOPramide  5 mg Oral TID AC  . metoprolol succinate  50 mg Oral Daily  . multivitamin  1 tablet Oral QHS  . mupirocin ointment   Nasal BID  . pantoprazole  40 mg Oral Q0600  . timolol  1 drop Both Eyes BID   Continuous Infusions: . sodium chloride Stopped (09/10/18 1325)  . sodium chloride    . sodium chloride    . [START ON 09/13/2018] ferric gluconate (FERRLECIT/NULECIT) IV     PRN Meds:.sodium chloride, sodium chloride, sodium chloride, albuterol, alteplase, bisacodyl, heparin, hydrALAZINE, HYDROmorphone (DILAUDID) injection, lidocaine (PF), lidocaine-prilocaine, ondansetron **OR** ondansetron (ZOFRAN) IV, pentafluoroprop-tetrafluoroeth      Subjective:   Howard Bell was seen and examined today.  No complaints, no chest pain, O2 at baseline 3 L.  No nausea vomiting fevers or chills.  No abdominal pain or diarrhea.  Objective:   Vitals:   09/12/18 0800 09/12/18 0900 09/12/18 1043 09/12/18 1135  BP:  (!) 146/80 (!) 150/77   Pulse: 86 86 84   Resp:      Temp:    98.7 F (37.1 C)  TempSrc:    Oral  SpO2: 98% 97%    Weight:      Height:        Intake/Output Summary (Last 24 hours) at 09/12/2018 1213 Last data filed at 09/11/2018 2000 Gross per 24 hour  Intake 220 ml  Output 3000 ml  Net -2780 ml     Wt Readings from Last 3 Encounters:  09/11/18 86.5 kg  09/05/18 93.1 kg  08/30/18 93.1 kg   Physical Exam  General: Alert and oriented x 3, NAD  Eyes:   HEENT:    Cardiovascular: S1 S2 clear, RRR. No pedal edema b/l  Respiratory: CTAB, no wheezing,  rales or rhonchi  Gastrointestinal: Obese, soft, nontender, nondistended, NBS  Ext: Bilateral AKA  Neuro: no new deficits  Musculoskeletal: No cyanosis, clubbing  Skin: No rashes  Psych: Normal affect and demeanor, alert and oriented x3    Data Reviewed:  I have personally reviewed following labs and imaging studies  Micro Results Recent Results (from the past 240 hour(s))  SARS Coronavirus 2 (CEPHEID - Performed  in [place] lab), Hosp Order     Status: None   Collection Time: 09/08/18  8:15 AM   Specimen: Nasopharyngeal Swab  Result Value Ref Range Status   SARS Coronavirus 2 NEGATIVE NEGATIVE Final    Comment: (NOTE) If result is NEGATIVE SARS-CoV-2 target nucleic acids are NOT DETECTED. The SARS-CoV-2 RNA is generally detectable in upper and lower  respiratory specimens during the acute phase of infection. The lowest  concentration of SARS-CoV-2 viral copies this assay can detect is 250  copies / mL. A negative result does not preclude SARS-CoV-2 infection  and should not be used as the sole basis for treatment or other  patient management decisions.  A negative result may occur with  improper specimen collection / handling, submission of specimen other  than nasopharyngeal swab, presence of viral mutation(s) within the  areas targeted by this assay, and inadequate number of viral copies  (<250 copies / mL). A negative result must be combined with clinical  observations, patient history, and epidemiological information. If result is POSITIVE SARS-CoV-2 target nucleic acids are DETECTED. The SARS-CoV-2 RNA is generally detectable in upper and lower  respiratory specimens dur ing the acute phase of infection.  Positive  results are indicative of active infection with SARS-CoV-2.  Clinical  correlation with patient history and other diagnostic information is  necessary to determine patient infection status.  Positive results do  not rule out bacterial  infection or co-infection with other viruses. If result is PRESUMPTIVE POSTIVE SARS-CoV-2 nucleic acids MAY BE PRESENT.   A presumptive positive result was obtained on the submitted specimen  and confirmed on repeat testing.  While 2019 novel coronavirus  (SARS-CoV-2) nucleic acids may be present in the submitted sample  additional confirmatory testing may be necessary for epidemiological  and / or clinical management purposes  to differentiate between  SARS-CoV-2 and other Sarbecovirus currently known to infect humans.  If clinically indicated additional testing with an alternate test  methodology [phone]) is advised. The SARS-CoV-2 RNA is generally  detectable in upper and lower respiratory sp ecimens during the acute  phase of infection. The expected result is Negative. Fact Sheet for Patients:  StrictlyIdeas.no Fact Sheet for Healthcare Providers: BankingDealers.co.za This test is not yet approved or cleared by the [place] FDA and has been authorized for detection and/or diagnosis of SARS-CoV-2 by FDA under an Emergency Use Authorization (EUA).  This EUA will remain in effect (meaning this test can be used) for the duration of the COVID-19 declaration under Section 564(b)(1) of the Act, 21 U.S.C. section 360bbb-3(b)(1), unless the authorization is terminated or revoked sooner. Performed at [facility], [place] [address], [place], [place] [address]   MRSA PCR Screening     Status: Abnormal   Collection Time: 09/08/18  5:01 PM   Specimen: Nasopharyngeal  Result Value Ref Range Status   MRSA by PCR POSITIVE (A) NEGATIVE Final    Comment:        The GeneXpert MRSA Assay (FDA approved for NASAL specimens only), is one component of a comprehensive MRSA colonization surveillance program. It is not intended to diagnose MRSA infection nor to guide or monitor treatment for MRSA infections. RESULT CALLED TO, READ BACK BY AND  VERIFIED WITHGeradine Girt RN 09/08/18 1932 JDW Performed at [facility], [address], [place],  [address]     Radiology Reports Dg Chest 2 View  Result Date: 08/23/2018 CLINICAL DATA:  Hypoxia EXAM: CHEST -  2 VIEW COMPARISON:  08/15/2018 FINDINGS: Cardiac shadow is mildly prominent. Lungs are well aerated bilaterally with mild bibasilar chronic scarring stable from the previous exam. Vascular stents on the left are again seen and stable. No new bony abnormality is noted. IMPRESSION: Bibasilar scarring.  No acute abnormality noted. Electronically Signed   By: Inez Catalina M.D.   On: 08/23/2018 12:18   Dg Chest 2 View  Result Date: 08/15/2018 CLINICAL DATA:  Fever. Left foot infection. History of diabetes, end-stage renal disease and pneumonia. EXAM: CHEST - 2 VIEW COMPARISON:  Radiographs 05/12/2018 and 05/02/2018. CT 05/02/2018 and 11/09/2017. FINDINGS: The heart size and mediastinal contours are stable. The patient has known chronic adenopathy in the right paratracheal and subcarinal regions which appears stable. Vascular stents are noted in the left subclavian and axillary vessels. Patchy right perihilar and lower lobe airspace opacities appear largely chronic, not significantly changed over the recent prior studies. The left lung is clear. There is no pleural effusion or pneumothorax. No acute osseous findings. IMPRESSION: No significant change in the appearance of the chest compared with prior studies obtained over the last 8 months. The right basilar airspace opacity appears chronic and may reflect postinflammatory scarring or chronic atypical inflammation. Grossly stable mediastinal adenopathy. Electronically Signed   By: Richardean Sale M.D.   On: 08/15/2018 16:13   Ct Head Wo Contrast  Result Date: 08/15/2018 CLINICAL DATA:  Altered mental status EXAM: CT HEAD WITHOUT CONTRAST TECHNIQUE: Contiguous axial images were obtained from the base of the skull through the vertex without  intravenous contrast. COMPARISON:  Head CT 11/06/2017 FINDINGS: Brain: No acute intracranial hemorrhage. Unchanged appearance of right parietal, left occipital and right cerebellar infarcts. No midline shift or other mass effect. Bilateral basal ganglia mineralization. Vascular: No abnormal hyperdensity of the major intracranial arteries or dural venous sinuses. No intracranial atherosclerosis. Skull: The visualized skull base, calvarium and extracranial soft tissues are normal. Sinuses/Orbits: No fluid levels or advanced mucosal thickening of the visualized paranasal sinuses. No mastoid or middle ear effusion. The orbits are normal. IMPRESSION: 1. No acute abnormality. 2. Unchanged appearance of left occipital, right parietal and right cerebellar infarcts. Electronically Signed   By: Ulyses Jarred M.D.   On: 08/15/2018 21:48   Portable Chest Xray  Result Date: 09/09/2018 CLINICAL DATA:  Intubation. EXAM: PORTABLE CHEST 1 VIEW COMPARISON:  09/08/2018 FINDINGS: Endotracheal tube terminates 4.1 cm from the carina. Enteric catheter within the expected location of the stomach. Cardiomediastinal silhouette is normal. Mediastinal contours appear intact. There is no evidence of pneumothorax. Bilateral basilar peribronchial infiltrates have slightly improved. Osseous structures are without acute abnormality. Soft tissues are grossly normal. IMPRESSION: 1. Support apparatus as described. 2. Slightly improved bilateral basilar peribronchial infiltrates. Electronically Signed   By: Fidela Salisbury M.D.   On: 09/09/2018 14:31   Dg Chest Port 1 View  Result Date: 09/08/2018 CLINICAL DATA:  Endotracheal tube placement. Acute respiratory failure. EXAM: PORTABLE CHEST 1 VIEW COMPARISON:  08/23/2018 and 08/15/2018 and 05/12/2018 FINDINGS: Endotracheal tube is in the proximal right mainstem bronchus and needs to be retracted approximately 2.5 cm. NG tube tip is in the fundus of the stomach with the side hole at the GE  junction. There is bibasilar atelectasis or hazy infiltrate which appears minimally progressed since the prior study of 08/23/2018. This is probably accentuated by the shallow inspiration. Vascular stent in the left subclavian vein. No acute bone abnormality. IMPRESSION: ETT in the right mainstem bronchus. Bibasilar infiltrates/atelectasis, unchanged. Critical Value/emergent results  were called by telephone at the time of interpretation on 09/08/2018 at 3:43 pm to Georges Lynch, RN , who verbally acknowledged these results. Electronically Signed   By: Lorriane Shire M.D.   On: 09/08/2018 15:46   Dg Foot Complete Left  Result Date: 08/15/2018 CLINICAL DATA:  Fever. Left foot infection. History of diabetes, end-stage renal disease and pneumonia. EXAM: LEFT FOOT - COMPLETE 3+ VIEW COMPARISON:  None. FINDINGS: There is no evidence of acute fracture or dislocation. There are possible old healed fractures of the 4th and 5th metatarsal necks. There are minimal degenerative changes at the 1st metatarsophalangeal joint. No erosive changes or bone destruction identified. There are prominent vascular calcifications consistent with diabetes. The soft tissues are diffusely prominent without focal swelling or foreign body. IMPRESSION: No acute osseous findings or evidence of osteomyelitis. Prominent vascular calcifications. Electronically Signed   By: Richardean Sale M.D.   On: 08/15/2018 16:15    Lab Data:  CBC: Recent Labs  Lab 09/08/18 1440 09/09/18 0330 09/10/18 0506 09/11/18 0422 09/12/18 0301  WBC 8.6 7.2 6.0 5.9 6.8  NEUTROABS 6.6  --  3.9 3.3 4.3  HGB 7.6* 7.3* 7.4* 7.4* 8.0*  HCT 24.8* 24.1* 24.7* 24.4* 25.9*  MCV 98.8 96.8 97.2 99.2 99.6  PLT 201 209 219 204 979   Basic Metabolic Panel: Recent Labs  Lab 09/08/18 1440 09/09/18 0330 09/09/18 2316 09/11/18 0422 09/12/18 0301  NA 138 136 136 133* 133*  K 3.7 4.0 4.3 3.9 3.8  CL 100 99 98 96* 96*  CO2 23 22 25 25 27   GLUCOSE 205* 100*  157* 181* 121*  BUN 52* 57* 35* 48* 30*  CREATININE 10.12* 11.15* 7.84* 10.67* 7.28*  CALCIUM 7.9* 8.3* 8.2* 8.2* 8.5*  MG 2.1  --   --   --   --   PHOS 5.4*  --   --   --   --    GFR: Estimated Creatinine Clearance: 13.2 mL/min (A) (by C-G formula based on SCr of 7.28 mg/dL (H)). Liver Function Tests: Recent Labs  Lab 09/09/18 0330 09/10/18 0506  AST 15 16  ALT 9 7  ALKPHOS 96 102  BILITOT 1.2 1.1  PROT 7.0 7.1  ALBUMIN 2.2* 2.2*   No results for input(s): LIPASE, AMYLASE in the last 168 hours. No results for input(s): AMMONIA in the last 168 hours. Coagulation Profile: Recent Labs  Lab 09/10/18 0506  INR 1.3*   Cardiac Enzymes: Recent Labs  Lab 09/08/18 1440 09/08/18 2147 09/09/18 0330 09/10/18 0506  TROPONINI 0.04* 0.06* 0.06* 0.05*   BNP (last 3 results) No results for input(s): PROBNP in the last 8760 hours. HbA1C: Recent Labs    09/10/18 1145  HGBA1C 6.4*   CBG: Recent Labs  Lab 09/11/18 0657 09/11/18 1117 09/11/18 1758 09/11/18 2137 09/12/18 0640  GLUCAP 158* 114* 122* 133* 97   Lipid Profile: No results for input(s): CHOL, HDL, LDLCALC, TRIG, CHOLHDL, LDLDIRECT in the last 72 hours. Thyroid Function Tests: No results for input(s): TSH, T4TOTAL, FREET4, T3FREE, THYROIDAB in the last 72 hours. Anemia Panel: No results for input(s): VITAMINB12, FOLATE, FERRITIN, TIBC, IRON, RETICCTPCT in the last 72 hours. Urine analysis:    Component Value Date/Time   COLORURINE RED (A) 11/19/2016 0020   APPEARANCEUR TURBID (A) 11/19/2016 0020   LABSPEC  11/19/2016 0020    TEST NOT REPORTED DUE TO COLOR INTERFERENCE OF URINE PIGMENT   PHURINE  11/19/2016 0020    TEST NOT REPORTED DUE TO COLOR  INTERFERENCE OF URINE PIGMENT   GLUCOSEU (A) 11/19/2016 0020    TEST NOT REPORTED DUE TO COLOR INTERFERENCE OF URINE PIGMENT   HGBUR (A) 11/19/2016 0020    TEST NOT REPORTED DUE TO COLOR INTERFERENCE OF URINE PIGMENT   BILIRUBINUR (A) 11/19/2016 0020    TEST NOT  REPORTED DUE TO COLOR INTERFERENCE OF URINE PIGMENT   KETONESUR (A) 11/19/2016 0020    TEST NOT REPORTED DUE TO COLOR INTERFERENCE OF URINE PIGMENT   PROTEINUR (A) 11/19/2016 0020    TEST NOT REPORTED DUE TO COLOR INTERFERENCE OF URINE PIGMENT   NITRITE (A) 11/19/2016 0020    TEST NOT REPORTED DUE TO COLOR INTERFERENCE OF URINE PIGMENT   LEUKOCYTESUR (A) 11/19/2016 0020    TEST NOT REPORTED DUE TO COLOR INTERFERENCE OF URINE PIGMENT     Nazar Kuan M.D. [facility] 09/12/2018, 12:13 PM  Pager: [phone] Between 7am to 7pm - call Pager - [phone]  After 7pm go to [email or web address] - password [email or web address]  Call night coverage person covering after 7pm

## 2018-09-13 DIAGNOSIS — I739 Peripheral vascular disease, unspecified: Secondary | ICD-10-CM

## 2018-09-13 DIAGNOSIS — Z992 Dependence on renal dialysis: Secondary | ICD-10-CM

## 2018-09-13 DIAGNOSIS — I10 Essential (primary) hypertension: Secondary | ICD-10-CM

## 2018-09-13 DIAGNOSIS — Z794 Long term (current) use of insulin: Secondary | ICD-10-CM

## 2018-09-13 DIAGNOSIS — N186 End stage renal disease: Secondary | ICD-10-CM

## 2018-09-13 DIAGNOSIS — E1122 Type 2 diabetes mellitus with diabetic chronic kidney disease: Secondary | ICD-10-CM

## 2018-09-13 LAB — BASIC METABOLIC PANEL
Anion gap: 13 (ref 5–15)
BUN: 46 mg/dL — ABNORMAL HIGH (ref 6–20)
CO2: 25 mmol/L (ref 22–32)
Calcium: 8.5 mg/dL — ABNORMAL LOW (ref 8.9–10.3)
Chloride: 92 mmol/L — ABNORMAL LOW (ref 98–111)
Creatinine, Ser: 9.22 mg/dL — ABNORMAL HIGH (ref 0.61–1.24)
GFR calc Af Amer: 7 mL/min — ABNORMAL LOW (ref >60)
GFR calc non Af Amer: 6 mL/min — ABNORMAL LOW (ref >60)
Glucose, Bld: 169 mg/dL — ABNORMAL HIGH (ref 70–99)
Potassium: 4.3 mmol/L (ref 3.5–5.1)
Sodium: 130 mmol/L — ABNORMAL LOW (ref 135–145)

## 2018-09-13 LAB — CBC WITH DIFFERENTIAL/PLATELET
Abs Immature Granulocytes: 0.03 10*3/uL (ref 0.00–0.07)
Basophils Absolute: 0 10*3/uL (ref 0.0–0.1)
Basophils Relative: 1 %
Eosinophils Absolute: 0.4 10*3/uL (ref 0.0–0.5)
Eosinophils Relative: 6 %
HCT: 24.3 % — ABNORMAL LOW (ref 39.0–52.0)
Hemoglobin: 7.4 g/dL — ABNORMAL LOW (ref 13.0–17.0)
Immature Granulocytes: 1 %
Lymphocytes Relative: 25 %
Lymphs Abs: 1.6 10*3/uL (ref 0.7–4.0)
MCH: 29.6 pg (ref 26.0–34.0)
MCHC: 30.5 g/dL (ref 30.0–36.0)
MCV: 97.2 fL (ref 80.0–100.0)
Monocytes Absolute: 0.9 10*3/uL (ref 0.1–1.0)
Monocytes Relative: 15 %
Neutro Abs: 3.2 10*3/uL (ref 1.7–7.7)
Neutrophils Relative %: 52 %
Platelets: 201 10*3/uL (ref 150–400)
RBC: 2.5 MIL/uL — ABNORMAL LOW (ref 4.22–5.81)
RDW: 18.6 % — ABNORMAL HIGH (ref 11.5–15.5)
WBC: 6.2 10*3/uL (ref 4.0–10.5)
nRBC: 0 % (ref 0.0–0.2)

## 2018-09-13 LAB — GLUCOSE, CAPILLARY
Glucose-Capillary: 116 mg/dL — ABNORMAL HIGH (ref 70–99)
Glucose-Capillary: 118 mg/dL — ABNORMAL HIGH (ref 70–99)
Glucose-Capillary: 239 mg/dL — ABNORMAL HIGH (ref 70–99)
Glucose-Capillary: 190 mg/dL — ABNORMAL HIGH (ref 70–99)

## 2018-09-13 MED ORDER — HEPARIN SODIUM (PORCINE) 1000 UNIT/ML DIALYSIS
2000.0000 [IU] | INTRAMUSCULAR | Status: DC | PRN
  Filled 2018-09-13: qty 2

## 2018-09-13 NOTE — Progress Notes (Signed)
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 Sharol Given service on 6/19 for elective left AKA after failing medical management.  He was taken to the OR, however.  He was unable to wake up and was hypoxic requiring intermittent vasopressor support and maintenance of the ETT.  He was transferred to the ICU, and then successfully extubated on 6/20, off the vasopressors and pressors drip and TR[facility] assume care as consultants.  Subjective: Complains of left lower leg pain, stable in the last few days.  Denies any chest pain, no shortness of breath, no abdominal pain, nausea or vomiting  Assessment & Plan: Active Problems:   Status post bilateral above knee amputation (HCC)   Acute respiratory failure with hypercapnia (HCC)   Principal Problem Bilateral AKA -With most recent left AKA on 6/19, today postop day 5 -Patient insistent that he wants to go home on discharge -Management per primary  Active Problems Acute on chronic hypoxic and hypercapnic respiratory failure, baseline 2 L oxygen during the daytime and 3 L at night, OSA, obesity hypoventilation syndrome -Currently respiratory status is back to baseline on 2 L nasal cannula. -Reports that BiPAP has been misplaced since he left rehab, family advised to call the rehab  Acute metabolic encephalopathy -Postop, resolved, alert and oriented x3, off the Precedex drip  HTN -Normal 2D echo on 6/16 with EF of 60-65% -had mild troponin elevation due to demand ischemia -Still hypertensive this morning but scheduled to get dialysis today.  We will monitor blood pressure  post dialysis -Continue amlodipine as well as metoprolol  History of PAD/PVD -Stable, post bilateral AKA  ESRD on hemodialysis, MWF -Dialysis scheduled today  History of gastroparesis -continue Reglan    Scheduled Meds: . amLODipine  10 mg Oral Daily  . brimonidine  1 drop Both Eyes BID  . calcium acetate  1,334 mg Oral TID WC  . chlorhexidine  15 mL Mouth Rinse BID  . chlorhexidine gluconate (MEDLINE KIT)  15 mL Mouth Rinse BID  . Chlorhexidine Gluconate Cloth  6 each Topical Q0600  . docusate sodium  100 mg Oral BID  . feeding supplement (NEPRO CARB STEADY)  237 mL Oral Q24H  . feeding supplement (PRO-STAT SUGAR FREE 64)  30 mL Oral BID  . fluticasone  1 spray Each Nare Daily  . heparin injection (subcutaneous)  5,000 Units Subcutaneous Q8H  . insulin aspart  0-5 Units Subcutaneous QHS  . insulin aspart  0-9 Units Subcutaneous TID WC  . mouth rinse  15 mL Mouth Rinse q12n4p  . metoCLOPramide  5 mg Oral TID AC  . metoprolol succinate  50 mg Oral Daily  . multivitamin  1 tablet Oral QHS  . mupirocin ointment   Nasal BID  . pantoprazole  40 mg Oral Q0600  . timolol  1 drop Both Eyes BID   Continuous Infusions: . sodium chloride Stopped (09/10/18 1325)  . sodium chloride    . sodium chloride    . ferric gluconate (FERRLECIT/NULECIT) IV     PRN Meds:.sodium chloride, sodium chloride, sodium chloride, albuterol, alteplase, bisacodyl, heparin, [START ON 09/14/2018] heparin, hydrALAZINE, HYDROmorphone (DILAUDID) injection, lidocaine (PF), lidocaine-prilocaine, menthol-cetylpyridinium,  ondansetron **OR** ondansetron (ZOFRAN) IV, pentafluoroprop-tetrafluoroeth  DVT prophylaxis: heparin Code Status: Full code Family Communication: d/w patient  Disposition Plan: home when ready per ortho   Procedures:   2D echo:  IMPRESSIONS    1. The left ventricle has normal systolic function with an ejection fraction of 60-65%. The cavity size was normal. There is mildly increased  left ventricular wall thickness. Left ventricular diastolic Doppler parameters are consistent with impaired  relaxation. There is right ventricular volume and pressure overload. No evidence of left ventricular regional wall motion abnormalities.  2. The right ventricle has normal systolic function. The cavity was normal. There is no increase in right ventricular wall thickness. Right ventricular systolic pressure is moderately elevated with an estimated pressure of 52.3 mmHg.  3. There is moderate mitral annular calcification present.  4. The aortic valve is tricuspid. Mild thickening of the aortic valve. Moderate calcification of the aortic valve.    Left AKA, wound vac  Antimicrobials:  None    Objective: Vitals:   09/13/18 0830 09/13/18 0900 09/13/18 0930 09/13/18 1000  BP: (!) 177/92 (!) 186/84 (!) 197/82 (!) 182/85  Pulse: 78 81 92 93  Resp: 16 16    Temp:      TempSrc:      SpO2:      Weight:      Height:        Intake/Output Summary (Last 24 hours) at 09/13/2018 1034 Last data filed at 09/13/2018 0800 Gross per 24 hour  Intake 222 ml  Output 50 ml  Net 172 ml   Filed Weights   09/11/18 1255 09/11/18 1615 09/13/18 0815  Weight: 89.8 kg 86.5 kg 89.9 kg    Examination:  Constitutional: NAD Eyes:  lids and conjunctivae normal ENMT: Mucous membranes are moist. Respiratory: clear to auscultation bilaterally, no wheezing, no crackles. Normal respiratory effort.  Cardiovascular: Regular rate and rhythm, no murmurs / rubs / gallops.  Abdomen: no tenderness. Bowel sounds positive.  Musculoskeletal: no clubbing / cyanosis. Bilateral AKA  Skin: no rashes Neurologic: CN 2-12 grossly intact. Strength 5/5 in all 4.  Psychiatric: Normal judgment and insight. Alert and oriented x 3. Normal mood.    Data Reviewed: I have independently reviewed following labs and imaging studies   CBC: Recent Labs  Lab 09/08/18 1440 09/09/18 0330 09/10/18 0506 09/11/18 0422 09/12/18  0301 09/13/18 0257  WBC 8.6 7.2 6.0 5.9 6.8 6.2  NEUTROABS 6.6  --  3.9 3.3 4.3 3.2  HGB 7.6* 7.3* 7.4* 7.4* 8.0* 7.4*  HCT 24.8* 24.1* 24.7* 24.4* 25.9* 24.3*  MCV 98.8 96.8 97.2 99.2 99.6 97.2  PLT 201 209 219 204 213 440   Basic Metabolic Panel: Recent Labs  Lab 09/08/18 1440 09/09/18 0330 09/09/18 2316 09/11/18 0422 09/12/18 0301 09/13/18 0257  NA 138 136 136 133* 133* 130*  K 3.7 4.0 4.3 3.9 3.8 4.3  CL 100 99 98 96* 96* 92*  CO2 23 22 25 25 27 25   GLUCOSE 205* 100* 157* 181* 121* 169*  BUN 52* 57* 35* 48* 30* 46*  CREATININE 10.12* 11.15* 7.84* 10.67* 7.28* 9.22*  CALCIUM 7.9* 8.3* 8.2* 8.2* 8.5* 8.5*  MG 2.1  --   --   --   --   --   PHOS 5.4*  --   --   --   --   --    GFR: Estimated Creatinine Clearance: 10.4 mL/min (A) (by C-G formula based on SCr of 9.22 mg/dL (H)). Liver Function Tests:  Recent Labs  Lab 09/09/18 0330 09/10/18 0506  AST 15 16  ALT 9 7  ALKPHOS 96 102  BILITOT 1.2 1.1  PROT 7.0 7.1  ALBUMIN 2.2* 2.2*   No results for input(s): LIPASE, AMYLASE in the last 168 hours. No results for input(s): AMMONIA in the last 168 hours. Coagulation Profile: Recent Labs  Lab 09/10/18 0506  INR 1.3*   Cardiac Enzymes: Recent Labs  Lab 09/08/18 1440 09/08/18 2147 09/09/18 0330 09/10/18 0506  TROPONINI 0.04* 0.06* 0.06* 0.05*   BNP (last 3 results) No results for input(s): PROBNP in the last 8760 hours. HbA1C: Recent Labs    09/10/18 1145  HGBA1C 6.4*   CBG: Recent Labs  Lab 09/12/18 0640 09/12/18 1132 09/12/18 1654 09/12/18 2103 09/13/18 0656  GLUCAP 97 157* 174* 157* 116*   Lipid Profile: No results for input(s): CHOL, HDL, LDLCALC, TRIG, CHOLHDL, LDLDIRECT in the last 72 hours. Thyroid Function Tests: No results for input(s): TSH, T4TOTAL, FREET4, T3FREE, THYROIDAB in the last 72 hours. Anemia Panel: No results for input(s): VITAMINB12, FOLATE, FERRITIN, TIBC, IRON, RETICCTPCT in the last 72 hours. Urine analysis:     Component Value Date/Time   COLORURINE RED (A) 11/19/2016 0020   APPEARANCEUR TURBID (A) 11/19/2016 0020   LABSPEC  11/19/2016 0020    TEST NOT REPORTED DUE TO COLOR INTERFERENCE OF URINE PIGMENT   PHURINE  11/19/2016 0020    TEST NOT REPORTED DUE TO COLOR INTERFERENCE OF URINE PIGMENT   GLUCOSEU (A) 11/19/2016 0020    TEST NOT REPORTED DUE TO COLOR INTERFERENCE OF URINE PIGMENT   HGBUR (A) 11/19/2016 0020    TEST NOT REPORTED DUE TO COLOR INTERFERENCE OF URINE PIGMENT   BILIRUBINUR (A) 11/19/2016 0020    TEST NOT REPORTED DUE TO COLOR INTERFERENCE OF URINE PIGMENT   KETONESUR (A) 11/19/2016 0020    TEST NOT REPORTED DUE TO COLOR INTERFERENCE OF URINE PIGMENT   PROTEINUR (A) 11/19/2016 0020    TEST NOT REPORTED DUE TO COLOR INTERFERENCE OF URINE PIGMENT   NITRITE (A) 11/19/2016 0020    TEST NOT REPORTED DUE TO COLOR INTERFERENCE OF URINE PIGMENT   LEUKOCYTESUR (A) 11/19/2016 0020    TEST NOT REPORTED DUE TO COLOR INTERFERENCE OF URINE PIGMENT   Sepsis Labs: Invalid input(s): PROCALCITONIN, LACTICIDVEN  Recent Results (from the past 240 hour(s))  SARS Coronavirus 2 (CEPHEID - Performed in [place] hospital lab), Hosp Order     Status: None   Collection Time: 09/08/18  8:15 AM   Specimen: Nasopharyngeal Swab  Result Value Ref Range Status   SARS Coronavirus 2 NEGATIVE NEGATIVE Final    Comment: (NOTE) If result is NEGATIVE SARS-CoV-2 target nucleic acids are NOT DETECTED. The SARS-CoV-2 RNA is generally detectable in upper and lower  respiratory specimens during the acute phase of infection. The lowest  concentration of SARS-CoV-2 viral copies this assay can detect is 250  copies / mL. A negative result does not preclude SARS-CoV-2 infection  and should not be used as the sole basis for treatment or other  patient management decisions.  A negative result may occur with  improper specimen collection / handling, submission of specimen other  than nasopharyngeal swab, presence  of viral mutation(s) within the  areas targeted by this assay, and inadequate number of viral copies  (<250 copies / mL). A negative result must be combined with clinical  observations, patient history, and epidemiological information. If result is POSITIVE SARS-CoV-2 target nucleic acids are DETECTED. The SARS-CoV-2  RNA is generally detectable in upper and lower  respiratory specimens dur ing the acute phase of infection.  Positive  results are indicative of active infection with SARS-CoV-2.  Clinical  correlation with patient history and other diagnostic information is  necessary to determine patient infection status.  Positive results do  not rule out bacterial infection or co-infection with other viruses. If result is PRESUMPTIVE POSTIVE SARS-CoV-2 nucleic acids MAY BE PRESENT.   A presumptive positive result was obtained on the submitted specimen  and confirmed on repeat testing.  While 2019 novel coronavirus  (SARS-CoV-2) nucleic acids may be present in the submitted sample  additional confirmatory testing may be necessary for epidemiological  and / or clinical management purposes  to differentiate between  SARS-CoV-2 and other Sarbecovirus currently known to infect humans.  If clinically indicated additional testing with an alternate test  methodology [phone]) is advised. The SARS-CoV-2 RNA is generally  detectable in upper and lower respiratory sp ecimens during the acute  phase of infection. The expected result is Negative. Fact Sheet for Patients:  StrictlyIdeas.no Fact Sheet for Healthcare Providers: BankingDealers.co.za This test is not yet approved or cleared by the [place] FDA and has been authorized for detection and/or diagnosis of SARS-CoV-2 by FDA under an Emergency Use Authorization (EUA).  This EUA will remain in effect (meaning this test can be used) for the duration of the COVID-19 declaration under Section  564(b)(1) of the Act, 21 U.S.C. section 360bbb-3(b)(1), unless the authorization is terminated or revoked sooner. Performed at [facility], [place] [address] [place], [place] [address]   MRSA PCR Screening     Status: Abnormal   Collection Time: 09/08/18  5:01 PM   Specimen: Nasopharyngeal  Result Value Ref Range Status   MRSA by PCR POSITIVE (A) NEGATIVE Final    Comment:        The GeneXpert MRSA Assay (FDA approved for NASAL specimens only), is one component of a comprehensive MRSA colonization surveillance program. It is not intended to diagnose MRSA infection nor to guide or monitor treatment for MRSA infections. RESULT CALLED TO, READ BACK BY AND VERIFIED WITHGeradine Girt RN 09/08/18 1932 JDW Performed at [facility], [address], [place], [place] [address]       Radiology Studies: No results found.   Marzetta Board, MD, PhD [facility]  Contact via  [email or web address]  [place] P: [phone]  F: [phone]

## 2018-09-13 NOTE — Plan of Care (Signed)
  Problem: Activity: Goal: Risk for activity intolerance will decrease Outcome: Not Progressing  Assist of two with activity. Independence in ADLs encouraged.  Problem: Pain Managment: Goal: General experience of comfort will improve Outcome: Not Progressing  C/O pain. No relief with first dose of medication. Will monitor and administer medications as ordered.

## 2018-09-13 NOTE — Progress Notes (Signed)
1710: Patient arrived to room 5W17. No signs of distress. Belongings at bedside. Does not want to check to ensure all items are present at this time. Personal items policy explained. States he has wallet, does not want to lock wallet in security. Bed locked in lowest position. Side rails up X2. Dressing to IV site changed.  Bruising noted to abdomen, right thumb knuckle pink with scab.

## 2018-09-13 NOTE — Progress Notes (Signed)
[facility] Progress Note   Subjective:    No new c/o's.  Doesn't have much to say.    Objective:   BP (!) 156/56 (BP Location: Right Arm)   Pulse 79   Temp 98.4 F (36.9 C) (Oral)   Resp 16   Ht 6' (1.829 m)   Wt 89.8 kg   SpO2 97%   BMI 26.85 kg/m   Physical Exam: General: sitting in bed, on [place] O2 Head: NCAT sclera not icteric  Lungs: normal WOB, clear bilat Heart: RRR with S1 S2.  Abdomen: soft NT + BS Lower extremities: left AKA with wound vac - right AKA with staples  Neuro: AAO,  Dialysis Access: left AVGG + bruit and thrill  Dialysis: AF MWF   4h 72mn  450/800  93kg  2/2.25 bath  Hep 9000  L AVG - parsabiv 10, iPTH 640 6/17  - Hb 8 at dc 6/10 on home O2 - Mircera 200 given 6/16 - tsat after last hosp d/c 37%  - had been off Parsabiv during recent hospitalization    Assessment/ Plan:     Assessment/Plan: 1. 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 Then went to CIR, then came back for AMS. Then went back to CIR. Then underwent L AKA 09/08/18 for multiple wounds not responding to Rx.  So now is bilat AKA.  2. ESRD - MWF HD. HD today 3. Hypertension/volume  - BP's good, 6- 7kg under prior edw which is approp for bilat AKA.  4.  Anemia  - received 200 Mircera 6/16; Hgb 7.3, follow and add back ESA as appropriate 5.  Metabolic bone disease -  Holding parsabiv while here due to unavailability; resuming binders now as eating (phoslo) 6.  Nutrition - chronic low alb. 7.  DM - per primary    RKelly Splinter MD 09/13/2018, 2:14 PM    BMET Recent Labs  Lab 09/08/18 0934  09/08/18 1336 09/08/18 1440 09/09/18 0330 09/09/18 2316 09/11/18 0422 09/12/18 0301 09/13/18 0257  NA 137   < > 138 138 136 136 133* 133* 130*  K 4.2   < > 3.5 3.7 4.0 4.3 3.9 3.8 4.3  CL  --   --   --  100 99 98 96* 96* 92*  CO2  --   --   --  _0 GLUCOSE 182*  --   --  205* 100*  157* 181* 121* 169*  BUN  --   --   --  52* 57* 35* 48* 30* 46*  CREATININE  --   --   --  10.12* 11.15* 7.84* 10.67* 7.28* 9.22*  CALCIUM  --   --   --  7.9* 8.3* 8.2* 8.2* 8.5* 8.5*  PHOS  --   --   --  5.4*  --   --   --   --   --    < > = values in this interval not displayed.   CBC Recent Labs  Lab 09/10/18 0506 09/11/18 0422 09/12/18 0301 09/13/18 0257  WBC 6.0 5.9 6.8 6.2  NEUTROABS 3.9 3.3 4.3 3.2  HGB 7.4* 7.4* 8.0* 7.4*  HCT 24.7* 24.4* 25.9* 24.3*  MCV 97.2 99.2 99.6 97.2  PLT 219 204 213 201    _1 @ Medications:    . amLODipine  10 mg Oral Daily  . brimonidine  1 drop  Both Eyes BID  . calcium acetate  1,334 mg Oral TID WC  . chlorhexidine  15 mL Mouth Rinse BID  . chlorhexidine gluconate (MEDLINE KIT)  15 mL Mouth Rinse BID  . Chlorhexidine Gluconate Cloth  6 each Topical Q0600  . docusate sodium  100 mg Oral BID  . feeding supplement (NEPRO CARB STEADY)  237 mL Oral Q24H  . feeding supplement (PRO-STAT SUGAR FREE 64)  30 mL Oral BID  . fluticasone  1 spray Each Nare Daily  . heparin injection (subcutaneous)  5,000 Units Subcutaneous Q8H  . insulin aspart  0-5 Units Subcutaneous QHS  . insulin aspart  0-9 Units Subcutaneous TID WC  . mouth rinse  15 mL Mouth Rinse q12n4p  . metoCLOPramide  5 mg Oral TID AC  . metoprolol succinate  50 mg Oral Daily  . multivitamin  1 tablet Oral QHS  . mupirocin ointment   Nasal BID  . pantoprazole  40 mg Oral Q0600  . timolol  1 drop Both Eyes BID

## 2018-09-13 NOTE — Progress Notes (Signed)
Patient ID: Howard Bell, male   DOB: May 02, 1965, 53 y.o.   MRN: [id number] Patient without complaints this morning vital signs are stable wound VAC is dry.  Patient feels like he can transfer independently with a little therapy.  We will put in for inpatient rehab evaluation for therapy prior to discharge.

## 2018-09-13 NOTE — Progress Notes (Signed)
Inpatient Rehab Admissions Coordinator:   Inpatient Rehab Consult received.  Pt well known to this [facility] from previous admissions to CIR. Note therapy evals still pending. Will follow to determine most appropriate venue of care for f/u.   Shann Medal, PT, DPT Admissions Coordinator [phone] 09/13/18  3:56 PM

## 2018-09-14 ENCOUNTER — Inpatient Hospital Stay: Payer: Medicare Other | Admitting: Emergency Medicine

## 2018-09-14 LAB — BASIC METABOLIC PANEL
Anion gap: 11 (ref 5–15)
BUN: 37 mg/dL — ABNORMAL HIGH (ref 6–20)
CO2: 26 mmol/L (ref 22–32)
Calcium: 8.5 mg/dL — ABNORMAL LOW (ref 8.9–10.3)
Chloride: 96 mmol/L — ABNORMAL LOW (ref 98–111)
Creatinine, Ser: 6.87 mg/dL — ABNORMAL HIGH (ref 0.61–1.24)
GFR calc Af Amer: 10 mL/min — ABNORMAL LOW (ref >60)
GFR calc non Af Amer: 8 mL/min — ABNORMAL LOW (ref >60)
Glucose, Bld: 216 mg/dL — ABNORMAL HIGH (ref 70–99)
Potassium: 3.7 mmol/L (ref 3.5–5.1)
Sodium: 133 mmol/L — ABNORMAL LOW (ref 135–145)

## 2018-09-14 LAB — CBC
HCT: 23.9 % — ABNORMAL LOW (ref 39.0–52.0)
Hemoglobin: 7.3 g/dL — ABNORMAL LOW (ref 13.0–17.0)
MCH: 29.9 pg (ref 26.0–34.0)
MCHC: 30.5 g/dL (ref 30.0–36.0)
MCV: 98 fL (ref 80.0–100.0)
Platelets: 201 10*3/uL (ref 150–400)
RBC: 2.44 MIL/uL — ABNORMAL LOW (ref 4.22–5.81)
RDW: 18.4 % — ABNORMAL HIGH (ref 11.5–15.5)
WBC: 5.7 10*3/uL (ref 4.0–10.5)
nRBC: 0 % (ref 0.0–0.2)

## 2018-09-14 LAB — GLUCOSE, CAPILLARY
Glucose-Capillary: 115 mg/dL — ABNORMAL HIGH (ref 70–99)
Glucose-Capillary: 150 mg/dL — ABNORMAL HIGH (ref 70–99)
Glucose-Capillary: 158 mg/dL — ABNORMAL HIGH (ref 70–99)

## 2018-09-14 MED ORDER — HEPARIN SODIUM (PORCINE) 1000 UNIT/ML DIALYSIS: 1000.0000 [IU] | INTRAMUSCULAR | Status: DC | PRN

## 2018-09-14 MED ORDER — SODIUM CHLORIDE 0.9 % IV SOLN: 100.0000 mL | INTRAVENOUS | Status: DC | PRN

## 2018-09-14 MED ORDER — DARBEPOETIN ALFA 200 MCG/0.4ML IJ SOSY
200.0000 ug | PREFILLED_SYRINGE | INTRAMUSCULAR | Status: DC
  Administered 2018-09-15: 200 ug via INTRAVENOUS

## 2018-09-14 MED ORDER — DARBEPOETIN ALFA 100 MCG/0.5ML IJ SOSY: 100.0000 ug | PREFILLED_SYRINGE | INTRAMUSCULAR | Status: DC

## 2018-09-14 MED ORDER — HEPARIN SODIUM (PORCINE) 1000 UNIT/ML DIALYSIS: 20.0000 [IU]/kg | INTRAMUSCULAR | Status: DC | PRN

## 2018-09-14 NOTE — Progress Notes (Signed)
Occupational Therapy Evaluation Patient Details Name: Howard Bell MRN: [id number] DOB: [birth year] Today's Date: 09/14/2018    History of Present Illness Pt is a 53 y/o male s/p R transfemoral amputation. 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   Clinical Impression   PTA, pt was living at home with his wife, and reports requiring assistance from his wife for ADL/IADL and pt used w/c for functional mobility and was transferring to and from w/c indpendently. Pt currently requires maxA-totalA+2 with use of bed pad for lateral scoot transfer from EOB to recliner. Pt requires setupA for grooming and eating. Educated pt about the grieving process associated with losing a limb, pt agreeable to referral for counselor or Doristine Bosworth. Due to decline in current level of function, pt would benefit from acute OT to address established goals to facilitate safe D/C to venue listed below. At this time, recommend CIR follow-up. Will continue to follow acutely.     Follow Up Recommendations  CIR    Equipment Recommendations  3 in 1 bedside commode;Other (comment)(requires droparm;sliding board)    Recommendations for Other Services Other (comment)(Counselor or Pastor)     Precautions / Restrictions Precautions Precautions: Fall Restrictions Weight Bearing Restrictions: Yes RLE Weight Bearing: Non weight bearing LLE Weight Bearing: Non weight bearing      Mobility Bed Mobility Overal bed mobility: Needs Assistance Bed Mobility: Supine to Sit     Supine to sit: Max assist;+2 for physical assistance     General bed mobility comments: required use of bed pad and HOB elevated to come to EOB  Transfers Overall transfer level: Needs assistance   Transfers: Lateral/Scoot Transfers          Lateral/Scoot Transfers: Max assist;Total assist;+2 physical assistance General transfer comment: Max/Total A +2 for lateral transfer to recliner  towards R side - attempted to have patient push up from B UE with limited success - use of bed pad to assist    Balance Overall balance assessment: Needs assistance Sitting-balance support: Bilateral upper extremity supported Sitting balance-Leahy Scale: Fair                                     ADL either performed or assessed with clinical judgement   ADL Overall ADL's : Needs assistance/impaired Eating/Feeding: Set up;Sitting   Grooming: Set up;Sitting   Upper Body Bathing: Min guard;Sitting   Lower Body Bathing: Min guard;Sitting/lateral leans   Upper Body Dressing : Min guard;Sitting   Lower Body Dressing: Maximal assistance;Sitting/lateral leans   Toilet Transfer: Maximal assistance;+2 for physical assistance;+2 for safety/equipment;Transfer board Toilet Transfer Details (indicate cue type and reason): simulated transfer from EOB to recliner  Toileting- Clothing Manipulation and Hygiene: Maximal assistance;+2 for physical assistance;+2 for safety/equipment;Sitting/lateral lean       Functional mobility during ADLs: Maximal assistance;+2 for physical assistance;+2 for safety/equipment;Wheelchair       Vision Baseline Vision/History: Wears glasses Wears Glasses: At all times Vision Assessment?: No apparent visual deficits     Perception     Praxis      Pertinent Vitals/Pain Pain Assessment: Faces Faces Pain Scale: Hurts whole lot Pain Location: L residual limb with mobility Pain Descriptors / Indicators: Aching;Grimacing;Guarding Pain Intervention(s): Limited activity within patient's tolerance;Monitored during session;Repositioned     Hand Dominance Right   Extremity/Trunk Assessment Upper Extremity Assessment Upper Extremity Assessment: Generalized weakness   Lower  Extremity Assessment Lower Extremity Assessment: Defer to PT evaluation;RLE deficits/detail;LLE deficits/detail RLE Deficits / Details: AKA LLE Deficits / Details: AKA    Cervical / Trunk Assessment Cervical / Trunk Assessment: Normal   Communication Communication Communication: No difficulties   Cognition Arousal/Alertness: Awake/alert Behavior During Therapy: WFL for tasks assessed/performed Overall Cognitive Status: Impaired/Different from baseline Area of Impairment: Following commands;Safety/judgement;Problem solving                       Following Commands: Follows one step commands consistently;Follows one step commands with increased time Safety/Judgement: Decreased awareness of safety   Problem Solving: Slow processing;Decreased initiation;Difficulty sequencing;Requires verbal cues;Requires tactile cues     General Comments  wound vac for LLE;VSS throughout, HR low to mid 110s    Exercises     Shoulder Instructions      Home Living Family/patient expects to be discharged to:: Private residence Living Arrangements: Spouse/significant other;Children Available Help at Discharge: Family;Available 24 hours/day Type of Home: Apartment Home Access: Level entry(ground level)     Home Layout: One level     Bathroom Shower/Tub: Teacher, early years/pre: Standard     Home Equipment: Environmental consultant - 2 wheels;Wheelchair - [employer];Shower seat(sliding board)      Lives With: Spouse    Prior Functioning/Environment Level of Independence: Needs assistance  Gait / Transfers Assistance Needed: can get in/out of w/c by himself; ambulates short distances with RW; able to propel himself when in w/c ADL's / Homemaking Assistance Needed: requires setup assist for ADLs   Comments: recent CIR discharge on 08/30/2018 - was at home receiving [facility] therapies        OT Problem List: Decreased strength;Decreased range of motion;Decreased activity tolerance;Impaired balance (sitting and/or standing);Decreased safety awareness;Decreased knowledge of use of DME or AE;Decreased knowledge of precautions;Pain      OT  Treatment/Interventions: Self-care/ADL training;Therapeutic exercise;Energy conservation;DME and/or AE instruction;Modalities;Patient/family education;Balance training    OT Goals(Current goals can be found in the care plan section) Acute Rehab OT Goals Patient Stated Goal: I want to go home OT Goal Formulation: With patient Time For Goal Achievement: 09/28/18 Potential to Achieve Goals: Good ADL Goals Pt Will Perform Grooming: with modified independence;sitting Pt Will Perform Upper Body Dressing: with modified independence Pt Will Perform Lower Body Dressing: with modified independence;sitting/lateral leans Pt Will Transfer to Toilet: with modified independence;with transfer board Pt Will Perform Toileting - Clothing Manipulation and hygiene: with modified independence;sitting/lateral leans  OT Frequency: Min 3X/week   Barriers to D/C:            Co-evaluation PT/OT/SLP Co-Evaluation/Treatment: Yes Reason for Co-Treatment: Complexity of the patient's impairments (multi-system involvement);For patient/therapist safety;To address functional/ADL transfers PT goals addressed during session: Mobility/safety with mobility;Balance OT goals addressed during session: ADL's and self-care      AM-PAC OT "6 Clicks" Daily Activity     Outcome Measure Help from another person eating meals?: None Help from another person taking care of personal grooming?: A Little Help from another person toileting, which includes using toliet, bedpan, or urinal?: A Lot Help from another person bathing (including washing, rinsing, drying)?: A Lot Help from another person to put on and taking off regular upper body clothing?: A Little Help from another person to put on and taking off regular lower body clothing?: A Lot 6 Click Score: 16   End of Session Equipment Utilized During Treatment: Gait belt;Rolling walker;Oxygen Nurse Communication: Mobility status  Activity Tolerance: Patient tolerated treatment  well  Patient left: in chair;with call bell/phone within reach  OT Visit Diagnosis: Other abnormalities of gait and mobility (R26.89);Pain;Other (comment)(Bilateral AKA) Pain - Right/Left: Left Pain - part of body: Leg                Time: 6440-3474 OT Time Calculation (min): 21 min Charges:  OT General Charges $OT Visit: 1 Visit OT Evaluation $OT Eval Moderate Complexity: [place] OTR/L Acute Rehabilitation Services Office: [place] 09/14/2018, 10:35 AM

## 2018-09-14 NOTE — Progress Notes (Signed)
PROGRESS NOTE  Howard Bell JGO:[id number] DOB: 18-Apr-1965 DOA: 09/08/2018 PCP: Horald Pollen, MD   LOS: 6 days   Brief Narrative / Interim history: 53 year old male with history of OSA, chronic hypoxic respiratory failure on 2 L at home, end-stage renal disease on HD MWF, PAD status post right BKA in February 2020, hypertension, type 2 diabetes mellitus, anemia, diabetic gastroparesis, depression, was admitted on Dr. Sharol Given service on 6/19 for elective left AKA after failing medical management.  He was taken to the OR, however.  He was unable to wake up and was hypoxic requiring intermittent vasopressor support and maintenance of the ETT.  He was transferred to the ICU, and then successfully extubated on 6/20, off the vasopressors and pressors drip and TRH assume care as consultants.  Subjective: Overall feels well, no chest pain, no abdominal pain, no nausea or vomiting.  Eating breakfast today  Assessment & Plan: Active Problems:   Status post bilateral above knee amputation (HCC)   Acute respiratory failure with hypercapnia (HCC)   Principal Problem Bilateral AKA -With most recent left AKA on 6/19, today postop day 5 -Patient insistent that he wants to go home on discharge but agreeable to CIR if they offer a bed -Management per primary  Active Problems Acute on chronic hypoxic and hypercapnic respiratory failure, baseline 2 L oxygen during the daytime and 3 L at night, OSA, obesity hypoventilation syndrome -Currently respiratory status is back to baseline on 2 L nasal cannula. -Reports that BiPAP has been misplaced since he left rehab, family advised by the case manager to call the rehab  Acute metabolic encephalopathy -Postop, resolved, alert and oriented x3, off the Precedex drip  HTN -Normal 2D echo on 6/16 with EF of 60-65% -had mild troponin elevation due to demand ischemia -Still hypertensive this morning but scheduled to get dialysis today.  We will monitor  blood pressure post dialysis -Continue amlodipine, metoprolol, blood pressure stable today  History of PAD/PVD -Stable, post bilateral AKA  ESRD on hemodialysis, MWF -Underwent dialysis yesterday  History of gastroparesis -continue Reglan    Scheduled Meds: . amLODipine  10 mg Oral Daily  . brimonidine  1 drop Both Eyes BID  . calcium acetate  1,334 mg Oral TID WC  . chlorhexidine  15 mL Mouth Rinse BID  . chlorhexidine gluconate (MEDLINE KIT)  15 mL Mouth Rinse BID  . Chlorhexidine Gluconate Cloth  6 each Topical Q0600  . [START ON 09/15/2018] darbepoetin (ARANESP) injection - DIALYSIS  200 mcg Intravenous Q Fri-HD  . docusate sodium  100 mg Oral BID  . feeding supplement (NEPRO CARB STEADY)  237 mL Oral Q24H  . feeding supplement (PRO-STAT SUGAR FREE 64)  30 mL Oral BID  . fluticasone  1 spray Each Nare Daily  . heparin injection (subcutaneous)  5,000 Units Subcutaneous Q8H  . insulin aspart  0-5 Units Subcutaneous QHS  . insulin aspart  0-9 Units Subcutaneous TID WC  . mouth rinse  15 mL Mouth Rinse q12n4p  . metoCLOPramide  5 mg Oral TID AC  . metoprolol succinate  50 mg Oral Daily  . multivitamin  1 tablet Oral QHS  . mupirocin ointment   Nasal BID  . pantoprazole  40 mg Oral Q0600  . timolol  1 drop Both Eyes BID   Continuous Infusions: . sodium chloride Stopped (09/10/18 1325)  . sodium chloride    . sodium chloride    . [START ON 09/15/2018] sodium chloride    . [START ON  09/15/2018] sodium chloride    . ferric gluconate (FERRLECIT/NULECIT) IV 105 mL/hr at 09/13/18 1300   PRN Meds:.sodium chloride, sodium chloride, sodium chloride, [START ON 09/15/2018] sodium chloride, [START ON 09/15/2018] sodium chloride, albuterol, alteplase, bisacodyl, [START ON 09/15/2018] heparin, [START ON 09/15/2018] heparin, hydrALAZINE, HYDROmorphone (DILAUDID) injection, lidocaine (PF), lidocaine-prilocaine, menthol-cetylpyridinium, ondansetron **OR** ondansetron (ZOFRAN) IV,  pentafluoroprop-tetrafluoroeth  DVT prophylaxis: heparin Code Status: Full code Family Communication: d/w patient  Disposition Plan: home when ready per ortho   Procedures:   2D echo:  IMPRESSIONS    1. The left ventricle has normal systolic function with an ejection fraction of 60-65%. The cavity size was normal. There is mildly increased left ventricular wall thickness. Left ventricular diastolic Doppler parameters are consistent with impaired  relaxation. There is right ventricular volume and pressure overload. No evidence of left ventricular regional wall motion abnormalities.  2. The right ventricle has normal systolic function. The cavity was normal. There is no increase in right ventricular wall thickness. Right ventricular systolic pressure is moderately elevated with an estimated pressure of 52.3 mmHg.  3. There is moderate mitral annular calcification present.  4. The aortic valve is tricuspid. Mild thickening of the aortic valve. Moderate calcification of the aortic valve.    Left AKA, wound vac  Antimicrobials:  None    Objective: Vitals:   09/14/18 0042 09/14/18 0213 09/14/18 0505 09/14/18 0828  BP: (!) 143/71  139/70 (!) 161/83  Pulse: 86 82 83 86  Resp: _0 Temp: 99.7 F (37.6 C)  98.7 F (37.1 C) 98.7 F (37.1 C)  TempSrc: Oral  Oral Oral  SpO2: 92% 96% 91% 98%  Weight:      Height:        Intake/Output Summary (Last 24 hours) at 09/14/2018 1059 Last data filed at 09/13/2018 2232 Gross per 24 hour  Intake 731.95 ml  Output 2000 ml  Net -1268.05 ml   Filed Weights   09/11/18 1615 09/13/18 0815 09/13/18 1120  Weight: 86.5 kg 89.9 kg 87.6 kg    Examination:  Constitutional: NAD Respiratory: CTA Cardiovascular: Regular rate and rhythm   Data Reviewed: I have independently reviewed following labs and imaging studies   CBC: Recent Labs  Lab 09/08/18 1440  09/10/18 0506 09/11/18 0422 09/12/18 0301 09/13/18 0257 09/14/18 0319   WBC 8.6   < > 6.0 5.9 6.8 6.2 5.7  NEUTROABS 6.6  --  3.9 3.3 4.3 3.2  --   HGB 7.6*   < > 7.4* 7.4* 8.0* 7.4* 7.3*  HCT 24.8*   < > 24.7* 24.4* 25.9* 24.3* 23.9*  MCV 98.8   < > 97.2 99.2 99.6 97.2 98.0  PLT 201   < > 219 204 213 201 201   < > = values in this interval not displayed.   Basic Metabolic Panel: Recent Labs  Lab 09/08/18 1440  09/09/18 2316 09/11/18 0422 09/12/18 0301 09/13/18 0257 09/14/18 0319  NA 138   < > 136 133* 133* 130* 133*  K 3.7   < > 4.3 3.9 3.8 4.3 3.7  CL 100   < > 98 96* 96* 92* 96*  CO2 23   < > _1 GLUCOSE 205*   < > 157* 181* 121* 169* 216*  BUN 52*   < > 35* 48* 30* 46* 37*  CREATININE 10.12*   < > 7.84* 10.67* 7.28* 9.22* 6.87*  CALCIUM 7.9*   < > 8.2* 8.2* 8.5* 8.5* 8.5*  MG 2.1  --   --   --   --   --   --   PHOS 5.4*  --   --   --   --   --   --    < > = values in this interval not displayed.   GFR: Estimated Creatinine Clearance: 14 mL/min (A) (by C-G formula based on SCr of 6.87 mg/dL (H)). Liver Function Tests: Recent Labs  Lab 09/09/18 0330 09/10/18 0506  AST 15 16  ALT 9 7  ALKPHOS 96 102  BILITOT 1.2 1.1  PROT 7.0 7.1  ALBUMIN 2.2* 2.2*   No results for input(s): LIPASE, AMYLASE in the last 168 hours. No results for input(s): AMMONIA in the last 168 hours. Coagulation Profile: Recent Labs  Lab 09/10/18 0506  INR 1.3*   Cardiac Enzymes: Recent Labs  Lab 09/08/18 1440 09/08/18 2147 09/09/18 0330 09/10/18 0506  TROPONINI 0.04* 0.06* 0.06* 0.05*   BNP (last 3 results) No results for input(s): PROBNP in the last 8760 hours. HbA1C: No results for input(s): HGBA1C in the last 72 hours. CBG: Recent Labs  Lab 09/13/18 0656 09/13/18 1254 09/13/18 1624 09/13/18 2116 09/14/18 0749  GLUCAP 116* 118* 239* 190* 115*   Lipid Profile: No results for input(s): CHOL, HDL, LDLCALC, TRIG, CHOLHDL, LDLDIRECT in the last 72 hours. Thyroid Function Tests: No results for input(s): TSH, T4TOTAL, FREET4, T3FREE,  THYROIDAB in the last 72 hours. Anemia Panel: No results for input(s): VITAMINB12, FOLATE, FERRITIN, TIBC, IRON, RETICCTPCT in the last 72 hours. Urine analysis:    Component Value Date/Time   COLORURINE RED (A) 11/19/2016 0020   APPEARANCEUR TURBID (A) 11/19/2016 0020   LABSPEC  11/19/2016 0020    TEST NOT REPORTED DUE TO COLOR INTERFERENCE OF URINE PIGMENT   PHURINE  11/19/2016 0020    TEST NOT REPORTED DUE TO COLOR INTERFERENCE OF URINE PIGMENT   GLUCOSEU (A) 11/19/2016 0020    TEST NOT REPORTED DUE TO COLOR INTERFERENCE OF URINE PIGMENT   HGBUR (A) 11/19/2016 0020    TEST NOT REPORTED DUE TO COLOR INTERFERENCE OF URINE PIGMENT   BILIRUBINUR (A) 11/19/2016 0020    TEST NOT REPORTED DUE TO COLOR INTERFERENCE OF URINE PIGMENT   KETONESUR (A) 11/19/2016 0020    TEST NOT REPORTED DUE TO COLOR INTERFERENCE OF URINE PIGMENT   PROTEINUR (A) 11/19/2016 0020    TEST NOT REPORTED DUE TO COLOR INTERFERENCE OF URINE PIGMENT   NITRITE (A) 11/19/2016 0020    TEST NOT REPORTED DUE TO COLOR INTERFERENCE OF URINE PIGMENT   LEUKOCYTESUR (A) 11/19/2016 0020    TEST NOT REPORTED DUE TO COLOR INTERFERENCE OF URINE PIGMENT   Sepsis Labs: Invalid input(s): PROCALCITONIN, LACTICIDVEN  Recent Results (from the past 240 hour(s))  SARS Coronavirus 2 (CEPHEID - Performed in [place] hospital lab), Hosp Order     Status: None   Collection Time: 09/08/18  8:15 AM   Specimen: Nasopharyngeal Swab  Result Value Ref Range Status   SARS Coronavirus 2 NEGATIVE NEGATIVE Final    Comment: (NOTE) If result is NEGATIVE SARS-CoV-2 target nucleic acids are NOT DETECTED. The SARS-CoV-2 RNA is generally detectable in upper and lower  respiratory specimens during the acute phase of infection. The lowest  concentration of SARS-CoV-2 viral copies this assay can detect is 250  copies / mL. A negative result does not preclude SARS-CoV-2 infection  and should not be used as the sole basis for treatment or other   patient management  decisions.  A negative result may occur with  improper specimen collection / handling, submission of specimen other  than nasopharyngeal swab, presence of viral mutation(s) within the  areas targeted by this assay, and inadequate number of viral copies  (<250 copies / mL). A negative result must be combined with clinical  observations, patient history, and epidemiological information. If result is POSITIVE SARS-CoV-2 target nucleic acids are DETECTED. The SARS-CoV-2 RNA is generally detectable in upper and lower  respiratory specimens dur ing the acute phase of infection.  Positive  results are indicative of active infection with SARS-CoV-2.  Clinical  correlation with patient history and other diagnostic information is  necessary to determine patient infection status.  Positive results do  not rule out bacterial infection or co-infection with other viruses. If result is PRESUMPTIVE POSTIVE SARS-CoV-2 nucleic acids MAY BE PRESENT.   A presumptive positive result was obtained on the submitted specimen  and confirmed on repeat testing.  While 2019 novel coronavirus  (SARS-CoV-2) nucleic acids may be present in the submitted sample  additional confirmatory testing may be necessary for epidemiological  and / or clinical management purposes  to differentiate between  SARS-CoV-2 and other Sarbecovirus currently known to infect humans.  If clinically indicated additional testing with an alternate test  methodology [phone]) is advised. The SARS-CoV-2 RNA is generally  detectable in upper and lower respiratory sp ecimens during the acute  phase of infection. The expected result is Negative. Fact Sheet for Patients:  StrictlyIdeas.no Fact Sheet for Healthcare Providers: BankingDealers.co.za This test is not yet approved or cleared by the [place] FDA and has been authorized for detection and/or diagnosis of SARS-CoV-2 by  FDA under an Emergency Use Authorization (EUA).  This EUA will remain in effect (meaning this test can be used) for the duration of the COVID-19 declaration under Section 564(b)(1) of the Act, 21 U.S.C. section 360bbb-3(b)(1), unless the authorization is terminated or revoked sooner. Performed at [facility], [place] [address], [place], [place] [address]   MRSA PCR Screening     Status: Abnormal   Collection Time: 09/08/18  5:01 PM   Specimen: Nasopharyngeal  Result Value Ref Range Status   MRSA by PCR POSITIVE (A) NEGATIVE Final    Comment:        The GeneXpert MRSA Assay (FDA approved for NASAL specimens only), is one component of a comprehensive MRSA colonization surveillance program. It is not intended to diagnose MRSA infection nor to guide or monitor treatment for MRSA infections. RESULT CALLED TO, READ BACK BY AND VERIFIED WITHGeradine Girt RN 09/08/18 1932 JDW Performed at [facility], [address] [place], [place] [address]       Radiology Studies: No results found.   Marzetta Board, MD, PhD [facility]  Contact via  [email or web address]  Luke P: [phone]  F: [phone]

## 2018-09-14 NOTE — Evaluation (Signed)
Physical Therapy Evaluation Patient Details Name: Howard Bell MRN: [id number] DOB: Mar 05, 1966 Today's Date: 09/14/2018   History of Present Illness  Pt is a 53 y/o male s/p R transfemoral amputation. 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    Clinical Impression  Patient with the above listed diagnosis. Patient also with recent R AKA - reports he has all required DME at home with manual w/c in room. Patient today educated on bed mobility and lateral scoot transfers to recliner. Patient requiring significant physical assist for bed level mobility and transfer - generally Max/Total A +2 with use of bed pad to rpevent shear forces. Will recommend CIR level therapies at discharge to progress safe functional mobility prior to return home.      Follow Up Recommendations CIR    Equipment Recommendations  None recommended by PT    Recommendations for Other Services Rehab consult     Precautions / Restrictions Precautions Precautions: Fall Restrictions Weight Bearing Restrictions: Yes RLE Weight Bearing: Non weight bearing LLE Weight Bearing: Non weight bearing      Mobility  Bed Mobility Overal bed mobility: Needs Assistance Bed Mobility: Supine to Sit     Supine to sit: Max assist;+2 for physical assistance     General bed mobility comments: required use of bed pad and HOB elevated to come to EOB  Transfers Overall transfer level: Needs assistance   Transfers: Lateral/Scoot Transfers          Lateral/Scoot Transfers: Max assist;Total assist;+2 physical assistance General transfer comment: Max/Total A +2 for lateral transfer to recliner towards R side - attempted to have patient push up from B UE with limited success - use of bed pad to assist  Ambulation/Gait                Stairs            Wheelchair Mobility    Modified Rankin (Stroke Patients Only)       Balance Overall balance  assessment: Needs assistance Sitting-balance support: Bilateral upper extremity supported Sitting balance-Leahy Scale: Fair                                       Pertinent Vitals/Pain Pain Assessment: Faces Faces Pain Scale: Hurts whole lot Pain Location: L residual limb with mobility Pain Descriptors / Indicators: Aching;Grimacing;Guarding Pain Intervention(s): Limited activity within patient's tolerance;Monitored during session;Repositioned    Home Living Family/patient expects to be discharged to:: Private residence Living Arrangements: Spouse/significant other;Children Available Help at Discharge: Family;Available 24 hours/day Type of Home: Apartment Home Access: Level entry(ground level)     Home Layout: One level Home Equipment: Walker - 2 wheels;Wheelchair - [employer];Shower seat(sliding board)      Prior Function Level of Independence: Needs assistance         Comments: recent CIR discharge on 08/30/2018 - was at home receiving HH therapies     Hand Dominance   Dominant Hand: Right    Extremity/Trunk Assessment   Upper Extremity Assessment Upper Extremity Assessment: Defer to OT evaluation    Lower Extremity Assessment Lower Extremity Assessment: RLE deficits/detail;LLE deficits/detail RLE Deficits / Details: AKA LLE Deficits / Details: AKA    Cervical / Trunk Assessment Cervical / Trunk Assessment: Normal  Communication   Communication: No difficulties  Cognition Arousal/Alertness: Awake/alert Behavior During Therapy: WFL for tasks assessed/performed Overall  Cognitive Status: Impaired/Different from baseline Area of Impairment: Following commands;Safety/judgement;Problem solving                       Following Commands: Follows one step commands consistently;Follows one step commands with increased time Safety/Judgement: Decreased awareness of safety   Problem Solving: Slow processing;Decreased  initiation;Difficulty sequencing;Requires verbal cues;Requires tactile cues        General Comments General comments (skin integrity, edema, etc.): wound vac at L LE    Exercises     Assessment/Plan    PT Assessment Patient needs continued PT services  PT Problem List Decreased strength;Decreased range of motion;Decreased activity tolerance;Decreased balance;Decreased mobility;Decreased knowledge of use of DME;Decreased safety awareness       PT Treatment Interventions DME instruction;Functional mobility training;Therapeutic activities;Therapeutic exercise;Balance training;Patient/family education;Wheelchair mobility training    PT Goals (Current goals can be found in the Care Plan section)  Acute Rehab PT Goals Patient Stated Goal: I want to go home PT Goal Formulation: With patient Time For Goal Achievement: 09/28/18 Potential to Achieve Goals: Good    Frequency Min 3X/week   Barriers to discharge        Co-evaluation PT/OT/SLP Co-Evaluation/Treatment: Yes Reason for Co-Treatment: Complexity of the patient's impairments (multi-system involvement);For patient/therapist safety;To address functional/ADL transfers PT goals addressed during session: Mobility/safety with mobility;Balance         AM-PAC PT "6 Clicks" Mobility  Outcome Measure Help needed turning from your back to your side while in a flat bed without using bedrails?: A Lot Help needed moving from lying on your back to sitting on the side of a flat bed without using bedrails?: A Lot Help needed moving to and from a bed to a chair (including a wheelchair)?: Total Help needed standing up from a chair using your arms (e.g., wheelchair or bedside chair)?: Total Help needed to walk in hospital room?: Total Help needed climbing 3-5 steps with a railing? : Total 6 Click Score: 8    End of Session Equipment Utilized During Treatment: Oxygen Activity Tolerance: Patient tolerated treatment well Patient left: in  chair;with call bell/phone within reach Nurse Communication: Mobility status PT Visit Diagnosis: Muscle weakness (generalized) (M62.81);Pain;Other abnormalities of gait and mobility (R26.89) Pain - Right/Left: Left Pain - part of body: Leg    Time: 7412-8786 PT Time Calculation (min) (ACUTE ONLY): 21 min   Charges:   PT Evaluation $PT Eval Moderate Complexity: 1 Mod          Lanney Gins, PT, DPT Supplemental Physical Therapist 09/14/18 10:21 AM Pager: [phone] Office: [phone]

## 2018-09-14 NOTE — Progress Notes (Signed)
Patient ID: Howard Bell, male   DOB: 1965/12/10, 53 y.o.   MRN: [id number] Patient is sitting up in bed without complaints.  There is 50 cc in the wound VAC canister.  Patient being reevaluated for possible admission to inpatient rehab.  If this is not possible patient states he would prefer to be discharged to home.

## 2018-09-14 NOTE — Care Management Important Message (Signed)
Important Message  Patient Details  Name: Howard Bell MRN: [id number] Date of Birth: 31-Jan-1966   Medicare Important Message Given:  Yes     Sharnita Bogucki 09/14/2018, 4:21 PM

## 2018-09-14 NOTE — Progress Notes (Addendum)
Howard Bell Progress Note   Subjective:    No new c/o's.  Seen in room, working with PT.    Objective:   BP (!) 156/56 (BP Location: Right Arm)   Pulse 79   Temp 98.4 F (36.9 C) (Oral)   Resp 16   Ht 6' (1.829 m)   Wt 89.8 kg   SpO2 97%   BMI 26.85 kg/m   Physical Exam: General: sitting in bed, on [place] O2 Head: NCAT sclera not icteric  Lungs: normal WOB, clear bilat Heart: RRR with S1 S2.  Abdomen: soft NT + BS Lower extremities: left AKA with wound vac - right AKA with staples  Neuro: AAO,  Dialysis Access: left AVGG + bruit and thrill  Dialysis: [facility] MWF   4h 20mn  450/800  93kg  2/2.25 bath  Hep 9000  L AVG - parsabiv 10, iPTH 640 6/17  - Hb 8 at dc 6/10 on home O2 - Mircera 200 given 6/16 - tsat after last hosp d/c 37%  - had been off Parsabiv during recent hospitalization    Assessment/ Plan:     Assessment/Plan: 1. 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 Then went to CIR, then came back for AMS. Then went back to CIR. Then underwent L AKA 09/08/18 for multiple wounds not responding to Rx.  So now is bilat AKA.  2. ESRD - MWF HD. Next HD 6/26. Use 4K bath  3. Hypertension/volume  - BP's good, 6- 7kg under prior edw which is approp for bilat AKA.  4. Anemia  - received 200 Mircera 6/16; Hgb 7.3, Resume esa with HD Friday.  5. Metabolic bone disease -  Holding parsabiv while here due to unavailability; resuming binders now as eating (phoslo) 6.  Nutrition - chronic low alb. 7.  DM - per primary  OLynnda ChildPA-C CIrondalePager [phone]/25/2020,9:40 AM  Pt seen, examined and agree w A/P as above.  Rob Mieka Leaton  MD 09/14/2018, 3:49 PM          BMET Recent Labs  Lab 09/08/18 1440 09/09/18 0330 09/09/18 2316 09/11/18 0422 09/12/18 0301 09/13/18 0257 09/14/18 0319  NA 138 136 136 133* 133* 130* 133*  K 3.7 4.0 4.3 3.9 3.8 4.3 3.7   CL 100 99 98 96* 96* 92* 96*  CO2 23 22 25 25 27 25 26   GLUCOSE 205* 100* 157* 181* 121* 169* 216*  BUN 52* 57* 35* 48* 30* 46* 37*  CREATININE 10.12* 11.15* 7.84* 10.67* 7.28* 9.22* 6.87*  CALCIUM 7.9* 8.3* 8.2* 8.2* 8.5* 8.5* 8.5*  PHOS 5.4*  --   --   --   --   --   --    CBC Recent Labs  Lab 09/10/18 0506 09/11/18 0422 09/12/18 0301 09/13/18 0257 09/14/18 0319  WBC 6.0 5.9 6.8 6.2 5.7  NEUTROABS 3.9 3.3 4.3 3.2  --   HGB 7.4* 7.4* 8.0* 7.4* 7.3*  HCT 24.7* 24.4* 25.9* 24.3* 23.9*  MCV 97.2 99.2 99.6 97.2 98.0  PLT 219 204 213 201 201    @IMGRELPRIORS @ Medications:    . amLODipine  10 mg Oral Daily  . brimonidine  1 drop Both Eyes BID  . calcium acetate  1,334 mg Oral TID WC  . chlorhexidine  15 mL Mouth Rinse BID  . chlorhexidine gluconate (MEDLINE KIT)  15 mL Mouth Rinse BID  .  Chlorhexidine Gluconate Cloth  6 each Topical Q0600  . docusate sodium  100 mg Oral BID  . feeding supplement (NEPRO CARB STEADY)  237 mL Oral Q24H  . feeding supplement (PRO-STAT SUGAR FREE 64)  30 mL Oral BID  . fluticasone  1 spray Each Nare Daily  . heparin injection (subcutaneous)  5,000 Units Subcutaneous Q8H  . insulin aspart  0-5 Units Subcutaneous QHS  . insulin aspart  0-9 Units Subcutaneous TID WC  . mouth rinse  15 mL Mouth Rinse q12n4p  . metoCLOPramide  5 mg Oral TID AC  . metoprolol succinate  50 mg Oral Daily  . multivitamin  1 tablet Oral QHS  . mupirocin ointment   Nasal BID  . pantoprazole  40 mg Oral Q0600  . timolol  1 drop Both Eyes BID

## 2018-09-14 NOTE — Progress Notes (Signed)
Nutrition Follow-up  RD working remotely.  DOCUMENTATION CODES:   Not applicable  INTERVENTION:   -Continue 30 ml Prostat BID, each supplement provides 100 kcals and 15 grams protein -Continue Nepro Shake po BID, each supplement provides 425 kcal and 19 grams protein -Continue renal MVI daily  NUTRITION DIAGNOSIS:   Increased nutrient needs related to post-op healing as evidenced by estimated needs.  Ongoing  GOAL:   Patient will meet greater than or equal to 90% of their needs  Progressing   MONITOR:   Weight trends, Vent status, Labs, Skin, I & O's  REASON FOR ASSESSMENT:   Ventilator    ASSESSMENT:   53 year old male who presented on 6/19 for left AKA due to gangrene of left foot an ankle. 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  6/19 - s/p left AKA and placement of wound VAC 6/20- extubated   Reviewed I/O's: -1.3 L x 24 hours and -2.7 L since admission  Per nephrology notes, dry weight 93 kg. Last HD 09/13/18.   Pt with appetite. Noted meal completion 100%. Pt is also compliant with Nepro and Prostat supplements.   CIR admissions coordinator following for possible discharge to CIR.   Labs reviewed: Na: 133, CBGS: 118-190 (inpatient orders for glycemic control are 0-5 units insulin aspart q HS and 0-9 units insulin aspart TID with meals).   Diet Order:   Diet Order            Diet renal/carb modified with fluid restriction Diet-HS Snack? Nothing; Fluid restriction: 1200 mL Fluid; Room service appropriate? No; Fluid consistency: Thin  Diet effective now              EDUCATION NEEDS:   No education needs have been identified at this time  Skin:  Skin Assessment: Skin Integrity Issues: Skin Integrity Issues:: Wound VAC, Incisions, Other (Comment) Wound Vac: L AKA Incisions: R AKA Other: Non-healing wound R hand (5th digit)  Last BM:  09/07/18  Height:   Ht Readings from Last 1 Encounters:  09/09/18 6' (1.829 m)     Weight:   Wt Readings from Last 1 Encounters:  09/13/18 87.6 kg    Ideal Body Weight:  68 kg(adjusted for bilateral AKAs)  BMI:  Body mass index is 26.19 kg/m.  Estimated Nutritional Needs:   Kcal:  2050-2250 kcal  Protein:  100-120 grams  Fluid:  >/= 2 L/day    Yameli Delamater A. Jimmye Norman, RD, LDN, [place] Registered Dietitian II Certified Diabetes Care and Education Specialist Pager: [phone] After hours Pager: [phone]

## 2018-09-14 NOTE — Telephone Encounter (Signed)
Dr Mitchel Honour, I called the place that is requesting the portable oxygen unit and told them patient was seen today by Cruzita Lederer, [place]. Looking that their office notes they may of been the ones ordering for this oxygen unit. They was given this Dr Cruzita Lederer name so they can request this information from his office. That Dr is through Internal Medicine and look like he was seen by them today

## 2018-09-14 NOTE — Progress Notes (Signed)
Inpatient Rehab Admissions:  Inpatient Rehab Consult received.  I met with patient at the bedside for rehabilitation assessment and to discuss goals and expectations of an inpatient rehab admission.  He is open to CIR stay again. I spoke to his wife, Kenney Houseman, over the phone and she confirms that she would like CIR as well, and can provide support.  Will plan for admission pending pain control.   Signed: Shann Medal, PT, DPT Admissions Coordinator [phone] 09/14/18  3:08 PM

## 2018-09-14 NOTE — Telephone Encounter (Signed)
Thank you :)

## 2018-09-14 NOTE — Progress Notes (Addendum)
Pt sitting up in chair, PT got him up. Pt reports pain in leg 6/10, declines pain medication at this time. Pt refused CHG bath, educated. Will try again later this shift.  Addendum - Pt later consented to CHG bath.

## 2018-09-15 LAB — CBC
HCT: 24.6 % — ABNORMAL LOW (ref 39.0–52.0)
Hemoglobin: 7.6 g/dL — ABNORMAL LOW (ref 13.0–17.0)
MCH: 29.6 pg (ref 26.0–34.0)
MCHC: 30.9 g/dL (ref 30.0–36.0)
MCV: 95.7 fL (ref 80.0–100.0)
Platelets: 203 10*3/uL (ref 150–400)
RBC: 2.57 MIL/uL — ABNORMAL LOW (ref 4.22–5.81)
RDW: 17.9 % — ABNORMAL HIGH (ref 11.5–15.5)
WBC: 6.1 10*3/uL (ref 4.0–10.5)
nRBC: 0 % (ref 0.0–0.2)

## 2018-09-15 LAB — BASIC METABOLIC PANEL
Anion gap: 13 (ref 5–15)
BUN: 58 mg/dL — ABNORMAL HIGH (ref 6–20)
CO2: 25 mmol/L (ref 22–32)
Calcium: 9.1 mg/dL (ref 8.9–10.3)
Chloride: 92 mmol/L — ABNORMAL LOW (ref 98–111)
Creatinine, Ser: 10.27 mg/dL — ABNORMAL HIGH (ref 0.61–1.24)
GFR calc Af Amer: 6 mL/min — ABNORMAL LOW (ref >60)
GFR calc non Af Amer: 5 mL/min — ABNORMAL LOW (ref >60)
Glucose, Bld: 157 mg/dL — ABNORMAL HIGH (ref 70–99)
Potassium: 4.2 mmol/L (ref 3.5–5.1)
Sodium: 130 mmol/L — ABNORMAL LOW (ref 135–145)

## 2018-09-15 LAB — GLUCOSE, CAPILLARY
Glucose-Capillary: 139 mg/dL — ABNORMAL HIGH (ref 70–99)
Glucose-Capillary: 171 mg/dL — ABNORMAL HIGH (ref 70–99)
Glucose-Capillary: 225 mg/dL — ABNORMAL HIGH (ref 70–99)

## 2018-09-15 MED ORDER — HYDROMORPHONE HCL 1 MG/ML IJ SOLN: 1.0000 mg | INTRAMUSCULAR | Status: DC | PRN

## 2018-09-15 MED ORDER — DARBEPOETIN ALFA 200 MCG/0.4ML IJ SOSY
PREFILLED_SYRINGE | INTRAMUSCULAR | Status: AC
  Filled 2018-09-15: qty 0.4

## 2018-09-15 MED ORDER — SODIUM CHLORIDE 0.9 % IV SOLN: 62.5000 mg | INTRAVENOUS | Status: DC

## 2018-09-15 MED ORDER — OXYCODONE HCL 5 MG PO TABS
5.0000 mg | ORAL_TABLET | ORAL | Status: DC | PRN
  Administered 2018-09-15 – 2018-09-16 (×3): 10 mg via ORAL
  Filled 2018-09-15 (×3): qty 2

## 2018-09-15 NOTE — H&P (Signed)
Physical Medicine and Rehabilitation Admission H&P    CC: Functional deficits due to L-AKA/history of R-AKA  HPI: Howard Bell is a 53 year old male with history of T2DM with neuropathy and retinopathy, ESRD- HD ion MWF, OSA with chronic hypoxic respiratory failure--oxygen dependent, PVD with gangrenous changes BLE s/p R-AKA with recent CIR stay. He was admitted on 09/08/18 for L-AKA due to progressive gangrenous changes. Post op with acute on chronic respiratory failure and hypotension requiring pressors. He was extubated to BIPAP on 6/20 and mentation slowly improving.  He has been weaned off BIPAP and acute on chronic anemia being monitored. Therapy evaluations completed revealing decline in functional status and CIR recommended for follow up therapy.    Review of Systems  Constitutional: Negative for chills and fever.  HENT: Negative for hearing loss and tinnitus.   Eyes: Positive for blurred vision.  Respiratory: Negative for cough and shortness of breath.   Cardiovascular: Negative for chest pain and palpitations.  Gastrointestinal: Negative for constipation and heartburn.  Musculoskeletal: Negative for myalgias.  Skin: Negative for rash.  Neurological: Positive for weakness. Negative for dizziness and headaches.  Psychiatric/Behavioral: Positive for memory loss.     Past Medical History:  Diagnosis Date   Arthritis    Congestive heart failure (CHF) ([place])    Depression    Diabetes mellitus without complication ([place])    insulin dependent   Diabetic gastroparesis ([place]) 11/06/2017   ESRD (end stage renal disease) on dialysis [facility])    M,W.F dialysis   H/O seasonal allergies    Hypertension    Pneumonia    Sleep apnea    not using it now, on continuous O2 2 L during the day, 3 L during the night    Past Surgical History:  Procedure Laterality Date   AMPUTATION Right 05/14/2018   Procedure: AMPUTATION BELOW KNEE;  Surgeon: Newt Minion, MD;  Location: [place];  Service: Orthopedics;  Laterality: Right;   AMPUTATION Right 08/04/2018   Procedure: RIGHT ABOVE KNEE AMPUTATION;  Surgeon: Newt Minion, MD;  Location: [place];  Service: Orthopedics;  Laterality: Right;   AMPUTATION Left 09/08/2018   Procedure: LEFT ABOVE KNEE AMPUTATION;  Surgeon: Newt Minion, MD;  Location: [place];  Service: Orthopedics;  Laterality: Left;   APPLICATION OF WOUND VAC Left 09/08/2018   Procedure: Application Of Wound Vac;  Surgeon: Newt Minion, MD;  Location: [place];  Service: Orthopedics;  Laterality: Left;   ESOPHAGOGASTRODUODENOSCOPY (EGD) WITH PROPOFOL N/A 12/05/2017   Procedure: ESOPHAGOGASTRODUODENOSCOPY (EGD) WITH PROPOFOL;  Surgeon: Doran Stabler, MD;  Location: [facility];  Service: Gastroenterology;  Laterality: N/A;   HERNIA REPAIR     IR AV DIALY SHUNT INTRO NEEDLE/INTRACATH INITIAL W/PTA/IMG LEFT  03/30/2017    Family History  Problem Relation Age of Onset   Diabetes Mother    Hypertension Father     Social History:  Married.  Per reports that he has never smoked. He has never used smokeless tobacco. He reports that he does not drink alcohol or use drugs.   Allergies: No Known Allergies    Medications Prior to Admission  Medication Sig Dispense Refill   acetaminophen (TYLENOL) 325 MG tablet Take 1-2 tablets (325-650 mg total) by mouth every 4 (four) hours as needed for mild pain.     albuterol (PROVENTIL HFA;VENTOLIN HFA) 108 (90 Base) MCG/ACT inhaler Inhale 1 puff into the lungs every 4 (four) hours as needed for wheezing or shortness of  breath.      brimonidine (ALPHAGAN) 0.2 % ophthalmic solution Place 1 drop into both eyes 2 (two) times daily.     calcium acetate (PHOSLO) 667 MG capsule Take 2 capsules (1,334 mg total) by mouth 3 (three) times daily with meals. 180 capsule 1   docusate sodium (COLACE) 100 MG capsule Take 1 capsule (100 mg total) by mouth 2 (two) times daily. 60 capsule 1   fluticasone (FLONASE) 50 MCG/ACT  nasal spray Place 1 spray into both nostrils daily.     insulin lispro (ADMELOG SOLOSTAR) 100 UNIT/ML KwikPen Inject 1-9 Units into the skin 3 (three) times daily. If bs is 121-150=1 unit, 151-200=2 units, 201-250=3 units, 251-300=5 units, 301-350=7 units, 351-400=9 units     latanoprost (XALATAN) 0.005 % ophthalmic solution Place 1 drop into both eyes at bedtime.     methocarbamol (ROBAXIN) 500 MG tablet Take 0.5 tablets (250 mg total) by mouth 2 (two) times daily as needed for muscle spasms. (Patient taking differently: Take 500 mg by mouth 2 (two) times daily as needed for muscle spasms. ) 30 tablet 0   metoCLOPramide (REGLAN) 5 MG tablet Take 1 tablet (5 mg total) by mouth 3 (three) times daily before meals. 90 tablet 0   metoprolol succinate (TOPROL-XL) 50 MG 24 hr tablet Take 1 tablet (50 mg total) by mouth at bedtime. Take with a meal. 30 tablet 0   omeprazole (PRILOSEC) 20 MG capsule Take 1 capsule (20 mg total) by mouth at bedtime. 30 capsule 0   oxyCODONE (OXY IR/ROXICODONE) 5 MG immediate release tablet Take 1 tablet (5 mg total) by mouth every 6 (six) hours as needed for severe pain. 30 tablet 0   sevelamer carbonate (RENVELA) 800 MG tablet Take 1 tablet (800 mg total) by mouth 3 (three) times daily with meals. 90 tablet 0   silver sulfADIAZINE (SILVADENE) 1 % cream Apply topically daily. 50 g 0   timolol (BETIMOL) 0.5 % ophthalmic solution Place 1 drop into both eyes 2 (two) times daily. 10 mL 1    Drug Regimen Review  Drug regimen was reviewed and remains appropriate with no significant issues identified  Home: Home Living Family/patient expects to be discharged to:: Private residence Living Arrangements: Spouse/significant other, Children Available Help at Discharge: Family, Available 24 hours/day Type of Home: Apartment Home Access: Level entry(ground level) Home Layout: One level Bathroom Shower/Tub: Chiropodist: Standard Home Equipment: Environmental consultant  - 2 wheels, Wheelchair - manual, Bedside commode, Shower seat(sliding board)  Lives With: Spouse   Functional History: Prior Function Level of Independence: Needs assistance Gait / Transfers Assistance Needed: can get in/out of w/c by himself; ambulates short distances with RW; able to propel himself when in w/c ADL's / Homemaking Assistance Needed: requires setup assist for ADLs Comments: recent CIR discharge on 08/30/2018 - was at home receiving [facility] therapies  Functional Status:  Mobility: Bed Mobility Overal bed mobility: Needs Assistance Bed Mobility: Supine to Sit Supine to sit: Max assist, +2 for physical assistance General bed mobility comments: required use of bed pad and HOB elevated to come to EOB Transfers Overall transfer level: Needs assistance Transfers: Lateral/Scoot Transfers  Lateral/Scoot Transfers: Max assist, Total assist, +2 physical assistance General transfer comment: Max/Total A +2 for lateral transfer to recliner towards R side - attempted to have patient push up from B UE with limited success - use of bed pad to assist      ADL: ADL Overall ADL's : Needs assistance/impaired Eating/Feeding: Set  up, Sitting Grooming: Set up, Sitting Upper Body Bathing: Min guard, Sitting Lower Body Bathing: Min guard, Sitting/lateral leans Upper Body Dressing : Min guard, Sitting Lower Body Dressing: Maximal assistance, Sitting/lateral leans Toilet Transfer: Maximal assistance, +2 for physical assistance, +2 for safety/equipment, Transfer board Toilet Transfer Details (indicate cue type and reason): simulated transfer from EOB to recliner  Toileting- Clothing Manipulation and Hygiene: Maximal assistance, +2 for physical assistance, +2 for safety/equipment, Sitting/lateral lean Functional mobility during ADLs: Maximal assistance, +2 for physical assistance, +2 for safety/equipment, Wheelchair  Cognition: Cognition Overall Cognitive Status: Impaired/Different from  baseline Orientation Level: Oriented X4 Cognition Arousal/Alertness: Awake/alert Behavior During Therapy: WFL for tasks assessed/performed Overall Cognitive Status: Impaired/Different from baseline Area of Impairment: Following commands, Safety/judgement, Problem solving Following Commands: Follows one step commands consistently, Follows one step commands with increased time Safety/Judgement: Decreased awareness of safety Problem Solving: Slow processing, Decreased initiation, Difficulty sequencing, Requires verbal cues, Requires tactile cues   Blood pressure (!) 151/54, pulse 93, temperature 98.9 F (37.2 C), temperature source Oral, resp. rate 13, height 6' (1.829 m), weight 88.3 kg, SpO2 100 %. Physical Exam  Nursing note and vitals reviewed. Constitutional: He is oriented to person, place, and time. He appears well-developed and well-nourished.  obese  Eyes: Pupils are equal, round, and reactive to light. EOM are normal.  Neck: Normal range of motion.  Cardiovascular: Normal rate and regular rhythm.  No murmur heard. Respiratory: Effort normal. No respiratory distress. He has no wheezes.  GI: Soft. He exhibits no distension. There is no abdominal tenderness.  Musculoskeletal:     Comments: Wound VAC on L-AKA. Right AKA still has sutures and staples in place.   Neurological: He is alert and oriented to person, place, and time.  UE 5/5. RLE can lift against gravity 3/5, LLE 2/5.   Skin:  Vac L AKA, Right AKA with staples, scab, area dry.  Psychiatric:  Pt cooperative, sl irritable    Results for orders placed or performed during the hospital encounter of 09/08/18 (from the past 48 hour(s))  Glucose, capillary     Status: Abnormal   Collection Time: 09/13/18  4:24 PM  Result Value Ref Range   Glucose-Capillary 239 (H) 70 - 99 mg/dL  Glucose, capillary     Status: Abnormal   Collection Time: 09/13/18  9:16 PM  Result Value Ref Range   Glucose-Capillary 190 (H) 70 - 99 mg/dL   Basic metabolic panel     Status: Abnormal   Collection Time: 09/14/18  3:19 AM  Result Value Ref Range   Sodium 133 (L) 135 - 145 mmol/L   Potassium 3.7 3.5 - 5.1 mmol/L   Chloride 96 (L) 98 - 111 mmol/L   CO2 26 22 - 32 mmol/L   Glucose, Bld 216 (H) 70 - 99 mg/dL   BUN 37 (H) 6 - 20 mg/dL   Creatinine, Ser 6.87 (H) 0.61 - 1.24 mg/dL   Calcium 8.5 (L) 8.9 - 10.3 mg/dL   GFR calc non Af Amer 8 (L) >60 mL/min   GFR calc Af Amer 10 (L) >60 mL/min   Anion gap 11 5 - 15    Comment: Performed at [facility], [address], [place] [address]  CBC     Status: Abnormal   Collection Time: 09/14/18  3:19 AM  Result Value Ref Range   WBC 5.7 4.0 - 10.5 K/uL   RBC 2.44 (L) 4.22 - 5.81 MIL/uL   Hemoglobin 7.3 (L) 13.0 -  17.0 g/dL   HCT 23.9 (L) 39.0 - 52.0 %   MCV 98.0 80.0 - 100.0 fL   MCH 29.9 26.0 - 34.0 pg   MCHC 30.5 30.0 - 36.0 g/dL   RDW 18.4 (H) 11.5 - 15.5 %   Platelets 201 150 - 400 K/uL   nRBC 0.0 0.0 - 0.2 %    Comment: Performed at [facility] [address] [place], [place] [address]  Glucose, capillary     Status: Abnormal   Collection Time: 09/14/18  7:49 AM  Result Value Ref Range   Glucose-Capillary 115 (H) 70 - 99 mg/dL  Glucose, capillary     Status: Abnormal   Collection Time: 09/14/18  4:49 PM  Result Value Ref Range   Glucose-Capillary 150 (H) 70 - 99 mg/dL  Glucose, capillary     Status: Abnormal   Collection Time: 09/14/18  9:28 PM  Result Value Ref Range   Glucose-Capillary 158 (H) 70 - 99 mg/dL  CBC     Status: Abnormal   Collection Time: 09/15/18  7:21 AM  Result Value Ref Range   WBC 6.1 4.0 - 10.5 K/uL   RBC 2.57 (L) 4.22 - 5.81 MIL/uL   Hemoglobin 7.6 (L) 13.0 - 17.0 g/dL   HCT 24.6 (L) 39.0 - 52.0 %   MCV 95.7 80.0 - 100.0 fL   MCH 29.6 26.0 - 34.0 pg   MCHC 30.9 30.0 - 36.0 g/dL   RDW 17.9 (H) 11.5 - 15.5 %   Platelets 203 150 - 400 K/uL   nRBC 0.0 0.0 - 0.2 %    Comment: Performed at [facility], [place]. [address], [place], [place] [address]  Basic metabolic panel     Status: Abnormal   Collection Time: 09/15/18  7:21 AM  Result Value Ref Range   Sodium 130 (L) 135 - 145 mmol/L   Potassium 4.2 3.5 - 5.1 mmol/L   Chloride 92 (L) 98 - 111 mmol/L   CO2 25 22 - 32 mmol/L   Glucose, Bld 157 (H) 70 - 99 mg/dL   BUN 58 (H) 6 - 20 mg/dL   Creatinine, Ser 10.27 (H) 0.61 - 1.24 mg/dL   Calcium 9.1 8.9 - 10.3 mg/dL   GFR calc non Af Amer 5 (L) >60 mL/min   GFR calc Af Amer 6 (L) >60 mL/min   Anion gap 13 5 - 15    Comment: Performed at [facility] [address], [place], [place] [address]  Glucose, capillary     Status: Abnormal   Collection Time: 09/15/18  7:44 AM  Result Value Ref Range   Glucose-Capillary 139 (H) 70 - 99 mg/dL   No results found.     Medical Problem List and Plan: 1.  Functional deficits secondary to PAD, new left AKA (09/08/2018), recent right AKA (08/04/2018)  -admit to inpatient rehab 2.  Antithrombotics: -DVT/anticoagulation:  Pharmaceutical: Heparin  -antiplatelet therapy: N/A 3. Pain Management: Oxycodone prn 4. Mood: LCSW to follow for evaluation and support.   -antipsychotic agents: N/A 5. Neuropsych: This patient is not fully capable of making decisions on his own behalf. 6. Skin/Wound Care: Continue wound VAC LLE  -staples R AKA probably ready to come out soon 7. Fluids/Electrolytes/Nutrition: Strict I/O. Daily weights. Labs with HD. 8. OSA/Chronic respiratory failure: Oxygen dependent--2 L during the day and 3 liters at nights. 9. HTN: Monitor BP TID--On Norvasc and metoprolol daily 10. T2DM: Brittle diabetic. Continue SSI as  at home. Reglan tid ac for gastroparesis.  11. Glaucoma with poor vision: On Timoptic at bedtime and Alphagan bid 12. Acute on chronic anemia: On Aranesp weekly.        Bary Leriche, PA-C 09/15/2018

## 2018-09-15 NOTE — Progress Notes (Signed)
To dialysis via transport at approx 1000

## 2018-09-15 NOTE — Progress Notes (Signed)
PROGRESS NOTE  Wlliam Bell SEG:[id number] DOB: [birth year] DOA: 09/08/2018 PCP: Horald Pollen, MD   LOS: 7 days   Brief Narrative / Interim history: 54 year old male with history of OSA, chronic hypoxic respiratory failure on 2 L at home, end-stage renal disease on HD MWF, PAD status post right BKA in February 2020, hypertension, type 2 diabetes mellitus, anemia, diabetic gastroparesis, depression, was admitted on Dr. Sharol Given service on 6/19 for elective left AKA after failing medical management.  He was taken to the OR, however.  He was unable to wake up and was hypoxic requiring intermittent vasopressor support and maintenance of the ETT.  He was transferred to the ICU, and then successfully extubated on 6/20, off the vasopressors and pressors drip and TR[facility] assume care as consultants.  Subjective: No complaints this morning. Denies dyspnea, no chest pain  Assessment & Plan: Active Problems:   Status post bilateral above knee amputation ([facility])   Acute respiratory failure with hypercapnia (HCC)   Principal Problem Bilateral AKA -With most recent left AKA on 6/19, today postop day 5 -Patient insistent that he wants to go home on discharge but agreeable to CIR if they offer a bed -Management per primary, OK from medical standpoint to CIR discharge  Active Problems Acute on chronic hypoxic and hypercapnic respiratory failure, baseline 2 L oxygen during the daytime and 3 L at night, OSA, obesity hypoventilation syndrome -Currently respiratory status is back to baseline on 2 L nasal cannula. -Reports that BiPAP has been misplaced since he left rehab, family advised by the case manager to call the rehab  Acute metabolic encephalopathy -Postop, resolved, alert and oriented x3, off the Precedex drip  HTN -Normal 2D echo on 6/16 with EF of 60-65% -had mild troponin elevation due to demand ischemia -Still hypertensive this morning but scheduled to get dialysis today.  We will monitor  blood pressure post dialysis -Continue amlodipine, metoprolol, BP stable  History of PAD/PVD -Stable, post bilateral AKA  ESRD on hemodialysis, MWF -Underwent dialysis yesterday  History of gastroparesis -continue Reglan    Scheduled Meds: . amLODipine  10 mg Oral Daily  . brimonidine  1 drop Both Eyes BID  . calcium acetate  1,334 mg Oral TID WC  . chlorhexidine  15 mL Mouth Rinse BID  . chlorhexidine gluconate (MEDLINE KIT)  15 mL Mouth Rinse BID  . Chlorhexidine Gluconate Cloth  6 each Topical Q0600  . darbepoetin (ARANESP) injection - DIALYSIS  200 mcg Intravenous Q Fri-HD  . docusate sodium  100 mg Oral BID  . feeding supplement (NEPRO CARB STEADY)  237 mL Oral Q24H  . feeding supplement (PRO-STAT SUGAR FREE 64)  30 mL Oral BID  . fluticasone  1 spray Each Nare Daily  . heparin injection (subcutaneous)  5,000 Units Subcutaneous Q8H  . insulin aspart  0-5 Units Subcutaneous QHS  . insulin aspart  0-9 Units Subcutaneous TID WC  . mouth rinse  15 mL Mouth Rinse q12n4p  . metoCLOPramide  5 mg Oral TID AC  . metoprolol succinate  50 mg Oral Daily  . multivitamin  1 tablet Oral QHS  . mupirocin ointment   Nasal BID  . pantoprazole  40 mg Oral Q0600  . timolol  1 drop Both Eyes BID   Continuous Infusions: . sodium chloride Stopped (09/10/18 1325)  . sodium chloride    . sodium chloride    . sodium chloride    . sodium chloride    . ferric gluconate (FERRLECIT/NULECIT) IV 105  mL/hr at 09/13/18 1300   PRN Meds:.sodium chloride, sodium chloride, sodium chloride, sodium chloride, sodium chloride, albuterol, alteplase, bisacodyl, heparin, heparin, hydrALAZINE, HYDROmorphone (DILAUDID) injection, lidocaine (PF), lidocaine-prilocaine, menthol-cetylpyridinium, ondansetron **OR** ondansetron (ZOFRAN) IV, pentafluoroprop-tetrafluoroeth  DVT prophylaxis: heparin Code Status: Full code Family Communication: d/w patient  Disposition Plan: CIR when bed available  Procedures:    2D echo:  IMPRESSIONS    1. The left ventricle has normal systolic function with an ejection fraction of 60-65%. The cavity size was normal. There is mildly increased left ventricular wall thickness. Left ventricular diastolic Doppler parameters are consistent with impaired  relaxation. There is right ventricular volume and pressure overload. No evidence of left ventricular regional wall motion abnormalities.  2. The right ventricle has normal systolic function. The cavity was normal. There is no increase in right ventricular wall thickness. Right ventricular systolic pressure is moderately elevated with an estimated pressure of 52.3 mmHg.  3. There is moderate mitral annular calcification present.  4. The aortic valve is tricuspid. Mild thickening of the aortic valve. Moderate calcification of the aortic valve.    Left AKA, wound vac  Antimicrobials:  None    Objective: Vitals:   09/14/18 1218 09/14/18 2125 09/15/18 0632 09/15/18 0634  BP: (!) 159/76 117/60 96/85 (!) 143/49  Pulse: 88 82 84 85  Resp: _0 Temp: 99 F (37.2 C) 98.6 F (37 C) 98.2 F (36.8 C)   TempSrc: Oral Oral Oral   SpO2: 98% 99% 96%   Weight:      Height:        Intake/Output Summary (Last 24 hours) at 09/15/2018 0741 Last data filed at 09/14/2018 0944 Gross per 24 hour  Intake 240 ml  Output -  Net 240 ml   Filed Weights   09/11/18 1615 09/13/18 0815 09/13/18 1120  Weight: 86.5 kg 89.9 kg 87.6 kg    Examination:  Constitutional: NAD Respiratory: CTA Cardiovascular: RRR, no murmurs   Data Reviewed: I have independently reviewed following labs and imaging studies   CBC: Recent Labs  Lab 09/08/18 1440  09/10/18 0506 09/11/18 0422 09/12/18 0301 09/13/18 0257 09/14/18 0319 09/15/18 0721  WBC 8.6   < > 6.0 5.9 6.8 6.2 5.7 6.1  NEUTROABS 6.6  --  3.9 3.3 4.3 3.2  --   --   HGB 7.6*   < > 7.4* 7.4* 8.0* 7.4* 7.3* 7.6*  HCT 24.8*   < > 24.7* 24.4* 25.9* 24.3* 23.9* 24.6*  MCV  98.8   < > 97.2 99.2 99.6 97.2 98.0 95.7  PLT 201   < > 219 204 213 201 201 203   < > = values in this interval not displayed.   Basic Metabolic Panel: Recent Labs  Lab 09/08/18 1440  09/09/18 2316 09/11/18 0422 09/12/18 0301 09/13/18 0257 09/14/18 0319  NA 138   < > 136 133* 133* 130* 133*  K 3.7   < > 4.3 3.9 3.8 4.3 3.7  CL 100   < > 98 96* 96* 92* 96*  CO2 23   < > _1 GLUCOSE 205*   < > 157* 181* 121* 169* 216*  BUN 52*   < > 35* 48* 30* 46* 37*  CREATININE 10.12*   < > 7.84* 10.67* 7.28* 9.22* 6.87*  CALCIUM 7.9*   < > 8.2* 8.2* 8.5* 8.5* 8.5*  MG 2.1  --   --   --   --   --   --  PHOS 5.4*  --   --   --   --   --   --    < > = values in this interval not displayed.   GFR: Estimated Creatinine Clearance: 14 mL/min (A) (by C-G formula based on SCr of 6.87 mg/dL (H)). Liver Function Tests: Recent Labs  Lab 09/09/18 0330 09/10/18 0506  AST 15 16  ALT 9 7  ALKPHOS 96 102  BILITOT 1.2 1.1  PROT 7.0 7.1  ALBUMIN 2.2* 2.2*   No results for input(s): LIPASE, AMYLASE in the last 168 hours. No results for input(s): AMMONIA in the last 168 hours. Coagulation Profile: Recent Labs  Lab 09/10/18 0506  INR 1.3*   Cardiac Enzymes: Recent Labs  Lab 09/08/18 1440 09/08/18 2147 09/09/18 0330 09/10/18 0506  TROPONINI 0.04* 0.06* 0.06* 0.05*   BNP (last 3 results) No results for input(s): PROBNP in the last 8760 hours. HbA1C: No results for input(s): HGBA1C in the last 72 hours. CBG: Recent Labs  Lab 09/13/18 1624 09/13/18 2116 09/14/18 0749 09/14/18 1649 09/14/18 2128  GLUCAP 239* 190* 115* 150* 158*   Lipid Profile: No results for input(s): CHOL, HDL, LDLCALC, TRIG, CHOLHDL, LDLDIRECT in the last 72 hours. Thyroid Function Tests: No results for input(s): TSH, T4TOTAL, FREET4, T3FREE, THYROIDAB in the last 72 hours. Anemia Panel: No results for input(s): VITAMINB12, FOLATE, FERRITIN, TIBC, IRON, RETICCTPCT in the last 72 hours. Urine  analysis:    Component Value Date/Time   COLORURINE RED (A) 11/19/2016 0020   APPEARANCEUR TURBID (A) 11/19/2016 0020   LABSPEC  11/19/2016 0020    TEST NOT REPORTED DUE TO COLOR INTERFERENCE OF URINE PIGMENT   PHURINE  11/19/2016 0020    TEST NOT REPORTED DUE TO COLOR INTERFERENCE OF URINE PIGMENT   GLUCOSEU (A) 11/19/2016 0020    TEST NOT REPORTED DUE TO COLOR INTERFERENCE OF URINE PIGMENT   HGBUR (A) 11/19/2016 0020    TEST NOT REPORTED DUE TO COLOR INTERFERENCE OF URINE PIGMENT   BILIRUBINUR (A) 11/19/2016 0020    TEST NOT REPORTED DUE TO COLOR INTERFERENCE OF URINE PIGMENT   KETONESUR (A) 11/19/2016 0020    TEST NOT REPORTED DUE TO COLOR INTERFERENCE OF URINE PIGMENT   PROTEINUR (A) 11/19/2016 0020    TEST NOT REPORTED DUE TO COLOR INTERFERENCE OF URINE PIGMENT   NITRITE (A) 11/19/2016 0020    TEST NOT REPORTED DUE TO COLOR INTERFERENCE OF URINE PIGMENT   LEUKOCYTESUR (A) 11/19/2016 0020    TEST NOT REPORTED DUE TO COLOR INTERFERENCE OF URINE PIGMENT   Sepsis Labs: Invalid input(s): PROCALCITONIN, LACTICIDVEN  Recent Results (from the past 240 hour(s))  SARS Coronavirus 2 (CEPHEID - Performed in [place] hospital lab), Hosp Order     Status: None   Collection Time: 09/08/18  8:15 AM   Specimen: Nasopharyngeal Swab  Result Value Ref Range Status   SARS Coronavirus 2 NEGATIVE NEGATIVE Final    Comment: (NOTE) If result is NEGATIVE SARS-CoV-2 target nucleic acids are NOT DETECTED. The SARS-CoV-2 RNA is generally detectable in upper and lower  respiratory specimens during the acute phase of infection. The lowest  concentration of SARS-CoV-2 viral copies this assay can detect is 250  copies / mL. A negative result does not preclude SARS-CoV-2 infection  and should not be used as the sole basis for treatment or other  patient management decisions.  A negative result may occur with  improper specimen collection / handling, submission of specimen other  than nasopharyngeal  swab, presence of viral mutation(s) within the  areas targeted by this assay, and inadequate number of viral copies  (<250 copies / mL). A negative result must be combined with clinical  observations, patient history, and epidemiological information. If result is POSITIVE SARS-CoV-2 target nucleic acids are DETECTED. The SARS-CoV-2 RNA is generally detectable in upper and lower  respiratory specimens dur ing the acute phase of infection.  Positive  results are indicative of active infection with SARS-CoV-2.  Clinical  correlation with patient history and other diagnostic information is  necessary to determine patient infection status.  Positive results do  not rule out bacterial infection or co-infection with other viruses. If result is PRESUMPTIVE POSTIVE SARS-CoV-2 nucleic acids MAY BE PRESENT.   A presumptive positive result was obtained on the submitted specimen  and confirmed on repeat testing.  While 2019 novel coronavirus  (SARS-CoV-2) nucleic acids may be present in the submitted sample  additional confirmatory testing may be necessary for epidemiological  and / or clinical management purposes  to differentiate between  SARS-CoV-2 and other Sarbecovirus currently known to infect humans.  If clinically indicated additional testing with an alternate test  methodology [phone]) is advised. The SARS-CoV-2 RNA is generally  detectable in upper and lower respiratory sp ecimens during the acute  phase of infection. The expected result is Negative. Fact Sheet for Patients:  StrictlyIdeas.no Fact Sheet for Healthcare Providers: BankingDealers.co.za This test is not yet approved or cleared by the [place] FDA and has been authorized for detection and/or diagnosis of SARS-CoV-2 by FDA under an Emergency Use Authorization (EUA).  This EUA will remain in effect (meaning this test can be used) for the duration of the COVID-19 declaration  under Section 564(b)(1) of the Act, 21 U.S.C. section 360bbb-3(b)(1), unless the authorization is terminated or revoked sooner. Performed at [facility], [place] [address], [place], [place] [address]   MRSA PCR Screening     Status: Abnormal   Collection Time: 09/08/18  5:01 PM   Specimen: Nasopharyngeal  Result Value Ref Range Status   MRSA by PCR POSITIVE (A) NEGATIVE Final    Comment:        The GeneXpert MRSA Assay (FDA approved for NASAL specimens only), is one component of a comprehensive MRSA colonization surveillance program. It is not intended to diagnose MRSA infection nor to guide or monitor treatment for MRSA infections. RESULT CALLED TO, READ BACK BY AND VERIFIED WITHGeradine Girt RN 09/08/18 1932 JDW Performed at [facility], [address], [place], [place] [address]       Radiology Studies: No results found.   Marzetta Board, MD, PhD [facility]  Contact via  [email or web address]  [place] P: [phone]  F: [phone]

## 2018-09-15 NOTE — Progress Notes (Addendum)
Howard Bell Progress Note   Dialysis Orders: AF MWF   4h 47mn  450/800  93kg  2/2.25 bath  Hep 9000  L AVG - parsabiv 10, iPTH 640 6/17  - Hb 8 at dc 6/10 on home O2 - Mircera 200 given 6/16 - tsat after last hosp d/c 37% - had been offParsabiv during recent hospitalization    Assessment/ Plan:     Assessment/Plan: 1. 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 Then went to CIR, then came back for AMS. Then went back to CIR. Then underwent L AKA 09/08/18 for multiple wounds not responding to Rx.  Now is bilat AKA.  2. ESRD- MWF HD. Next HD today  Use 3 K bath for 4.2 K 3. Hypertension/volume- BP's good, 6- 7kg under prior edw which is approp for bilat AKA. Net UF 2 L 6/24  4. Anemia-hgb 7.6 trending up - some post op loss received 200 Mircera 6/16;  Resume esa with HD Friday. tsat 37% 6/17 - add weekly Fe  5. Metabolic bone disease- Holding parsabiv while here due to unavailability; resuming binders now as eating (phoslo) - Ca a little high without parsabiv - 6. Nutrition- chronic low alb./ prostat ordered 7. DM- per primary 8. Disp - not clear about rehab plans but CIR being pursued which he would like   Howard Jacobson PA-C CRipley6/26/2020,10:19 AM  LOS: 7 days   Pt seen, examined and agree w A/P as above.  Howard Splinter MD 09/15/2018, 11:15 AM     Subjective:   Didn't sleep well last night. A lot of discomfort   Objective Vitals:   09/14/18 2125 09/15/18 0632 09/15/18 0634 09/15/18 0900  BP: 117/60 96/85 (!) 143/49 (!) 136/54  Pulse: 82 84 85 86  Resp: _0 Temp: 98.6 F (37 C) 98.2 F (36.8 C)  99.1 F (37.3 C)  TempSrc: Oral Oral  Oral  SpO2: 99% 96%  94%  Weight:      Height:       Physical Exam General:NAD wearing O2 Heart: RRR Lungs: grossly clear  Abdomen:obese soft NT Extremities: left AKA with VAC  mild edema right AKA staples intact no sig edema Dialysis Access:  Left AVGG + bruit   Additional Objective Labs: Basic Metabolic Panel: Recent Labs  Lab 09/08/18 1440  09/13/18 0257 09/14/18 0319 09/15/18 0721  NA 138   < > 130* 133* 130*  K 3.7   < > 4.3 3.7 4.2  CL 100   < > 92* 96* 92*  CO2 23   < > _1 GLUCOSE 205*   < > 169* 216* 157*  BUN 52*   < > 46* 37* 58*  CREATININE 10.12*   < > 9.22* 6.87* 10.27*  CALCIUM 7.9*   < > 8.5* 8.5* 9.1  PHOS 5.4*  --   --   --   --    < > = values in this interval not displayed.   Liver Function Tests: Recent Labs  Lab 09/09/18 0330 09/10/18 0506  AST 15 16  ALT 9 7  ALKPHOS 96 102  BILITOT 1.2 1.1  PROT 7.0 7.1  ALBUMIN 2.2* 2.2*   No results for input(s): LIPASE, AMYLASE in the last 168 hours. CBC: Recent Labs  Lab 09/11/18 0422 09/12/18 0301 09/13/18 0619506/25/20 0319 09/15/18 00932  WBC 5.9 6.8 6.2 5.7 6.1  NEUTROABS 3.3 4.3 3.2  --   --   HGB 7.4* 8.0* 7.4* 7.3* 7.6*  HCT 24.4* 25.9* 24.3* 23.9* 24.6*  MCV 99.2 99.6 97.2 98.0 95.7  PLT 204 213 201 201 203   Blood Culture    Component Value Date/Time   SDES BLOOD LEFT ANTECUBITAL 08/15/2018 1526   SPECREQUEST  08/15/2018 1526    BOTTLES DRAWN AEROBIC ONLY Blood Culture adequate volume   CULT  08/15/2018 1526    NO GROWTH 5 DAYS Performed at [facility] [address], [place], [place] [address]    REPTSTATUS 08/20/2018 FINAL 08/15/2018 1526    Cardiac Enzymes: Recent Labs  Lab 09/08/18 1440 09/08/18 2147 09/09/18 0330 09/10/18 0506  TROPONINI 0.04* 0.06* 0.06* 0.05*   CBG: Recent Labs  Lab 09/13/18 2116 09/14/18 0749 09/14/18 1649 09/14/18 2128 09/15/18 0744  GLUCAP 190* 115* 150* 158* 139*   Iron Studies: No results for input(s): IRON, TIBC, TRANSFERRIN, FERRITIN in the last 72 hours. Lab Results  Component Value Date   INR 1.3 (H) 09/10/2018   INR 1.2 08/15/2018   Studies/Results: No results found. Medications: .  sodium chloride Stopped (09/10/18 1325)  . sodium chloride    . sodium chloride    . sodium chloride    . sodium chloride    . ferric gluconate (FERRLECIT/NULECIT) IV 105 mL/hr at 09/13/18 1300   . amLODipine  10 mg Oral Daily  . brimonidine  1 drop Both Eyes BID  . calcium acetate  1,334 mg Oral TID WC  . chlorhexidine  15 mL Mouth Rinse BID  . chlorhexidine gluconate (MEDLINE KIT)  15 mL Mouth Rinse BID  . Chlorhexidine Gluconate Cloth  6 each Topical Q0600  . darbepoetin (ARANESP) injection - DIALYSIS  200 mcg Intravenous Q Fri-HD  . docusate sodium  100 mg Oral BID  . feeding supplement (NEPRO CARB STEADY)  237 mL Oral Q24H  . feeding supplement (PRO-STAT SUGAR FREE 64)  30 mL Oral BID  . fluticasone  1 spray Each Nare Daily  . heparin injection (subcutaneous)  5,000 Units Subcutaneous Q8H  . insulin aspart  0-5 Units Subcutaneous QHS  . insulin aspart  0-9 Units Subcutaneous TID WC  . mouth rinse  15 mL Mouth Rinse q12n4p  . metoCLOPramide  5 mg Oral TID AC  . metoprolol succinate  50 mg Oral Daily  . multivitamin  1 tablet Oral QHS  . mupirocin ointment   Nasal BID  . pantoprazole  40 mg Oral Q0600  . timolol  1 drop Both Eyes BID

## 2018-09-15 NOTE — PMR Pre-admission (Signed)
PMR Admission Coordinator Pre-Admission Assessment  Patient: Howard Bell is an 53 y.o., male MRN: [id number] DOB: 01/06/1966 Height: 6' (182.9 cm) Weight: 91.5 kg  Insurance Information HMO:     PPO:      PCP:      IPA:      80/20:      OTHER:  PRIMARY: Medicare A and B      Policy#: [id number]      Subscriber: patient CM Name:       Phone#:      Fax#:  Pre-Cert#: verified via passport onesource      Employer:  Benefits:  Phone #:      Name:  Eff. Date: Part A 09/19/04, Part B 08/20/05     Deduct: $1408      Out of Pocket Max: n/a      Life Max: n/a CIR: 100%      SNF: 22 days remaining at $176/day Outpatient: 80%     Co-Pay: 20% Home Health: 100%      Co-Pay:  DME: 80%     Co-Pay: 20% Providers: n/a SECONDARY:       Policy#:       Subscriber:  CM Name:       Phone#:      Fax#:  Pre-Cert#:       Employer:  Benefits:  Phone #:      Name:  Eff. Date:      Deduct:       Out of Pocket Max:       Life Max:  CIR:       SNF:  Outpatient:      Co-Pay:  Home Health:       Co-Pay:  DME:      Co-Pay:   Medicaid Application Date:       Case Manager:  Disability Application Date:       Case Worker:   The "Data Collection Information Summary" for patients in Inpatient Rehabilitation Facilities with attached "Privacy Act Statement-Health Care Records" was provided and verbally reviewed with: Family  Emergency Contact Information Contact Information    Name Relation Home Work Mobile   [name],[name] Spouse   [phone]      Current Medical History  Patient Admitting Diagnosis: L AKA  History of Present Illness: Pt is a 53 y/o male with PMH of ESRD (HD MWF), DM type 2, HTN, prior CVA and OSA. 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 Per wife, pt had fallen multiple times at SNF. F/U with Dr. Duda on 5/12, staples still present, pt not wearing shrinker, new dehiscence of R residual limb, also noted for new ischemic ulceration to  posterior aspect of the L ankle and superficial abscess of dorsal lateral aspect of L foot. Return to [place] for R BKA to AKA revision on 08/04/2018 with Dr. Duda with wound vac application for 1 week. Pt was admitted to CIR on 08/07/2018 and returned to acute on 5/26 for AMS related to septic gangrene infection to distal portion of L calf.  Dr. Duda consulted and felt no surgical intervention necessary at that time.  Pt cognition returned to baseline and he returned to CIR on 5/28.  He was discharged from CIR on 6/10 at supervision/min guard level with wife able to provide needed assist.  He returned to Dr. 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  He had difficulty weaning from vent following surgery and was admitted to cardiac ICU for weaning managed by critical care.  He was extubated on 09/09/2018.  Hospital course pain management.  Pt evaluated by therapy and recommendations were made for CIR.      Patient's medical record from [facility] has been reviewed by the rehabilitation admission coordinator and physician.  Past Medical History  Past Medical History:  Diagnosis Date  . Arthritis   . Congestive heart failure (CHF) (HCC)   . Depression   . 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 Diabetic gastroparesis (HCC) 11/06/2017  . ESRD (end stage renal disease) on dialysis (HCC)    M,W.F dialysis  . H/O seasonal allergies   . Hypertension   . Pneumonia   . Sleep apnea    not using it now, on continuous O2 2 L during the day, 3 L during the night    Family History   family history includes Diabetes in his mother; Hypertension in his father.  Prior Rehab/Hospitalizations Has the patient had prior rehab or hospitalizations prior to admission? Yes  Has the patient had major surgery during 100 days prior to admission? Yes   Current Medications  Current Facility-Administered  Medications:  .  0.9 %  sodium chloride infusion, , Intravenous, PRN, Rai, Ripudeep K, MD, Stopped at 09/10/18 1325 .  0.9 %  sodium chloride infusion, 100 mL, Intravenous, PRN, Rai, Ripudeep K, MD .  0.9 %  sodium chloride infusion, 100 mL, Intravenous, PRN, Rai, Ripudeep K, MD .  0.9 %  sodium chloride infusion, 100 mL, Intravenous, PRN, Ejigiri, Ogechi Grace, PA-C .  0.9 %  sodium chloride infusion, 100 mL, Intravenous, PRN, Ejigiri, Ogechi Grace, PA-C .  albuterol (PROVENTIL) (2.5 MG/3ML) 0.083% nebulizer solution 2.5 mg, 2.5 mg, Nebulization, Q4H PRN, Rai, Ripudeep K, MD .  alteplase (CATHFLO ACTIVASE) injection 2 mg, 2 mg, Intracatheter, Once PRN, Rai, Ripudeep K, MD .  amLODipine (NORVASC) tablet 10 mg, 10 mg, Oral, Daily, Rai, Ripudeep K, MD, Stopped at 09/15/18 0809 .  bisacodyl (DULCOLAX) suppository 10 mg, 10 mg, Rectal, Daily PRN, Rai, Ripudeep K, MD .  brimonidine (ALPHAGAN) 0.2 % ophthalmic solution 1 drop, 1 drop, Both Eyes, BID, Rai, Ripudeep K, MD, 1 drop at 09/14/18 2141 .  calcium acetate (PHOSLO) capsule 1,334 mg, 1,334 mg, Oral, TID WC, Rai, Ripudeep K, MD, Stopped at 09/15/18 1251 .  chlorhexidine (PERIDEX) 0.12 % solution 15 mL, 15 mL, Mouth Rinse, BID, Rai, Ripudeep K, MD, 15 mL at 09/14/18 2143 .  chlorhexidine gluconate (MEDLINE KIT) (PERIDEX) 0.12 % solution 15 mL, 15 mL, Mouth Rinse, BID, Rai, Ripudeep K, MD, 15 mL at 09/14/18 0950 .  Chlorhexidine Gluconate Cloth 2 % PADS 6 each, 6 each, Topical, Q0600, Schertz, Robert, MD, 6 each at 09/14/18 0521 .  Darbepoetin Alfa (ARANESP) injection 200 mcg, 200 mcg, Intravenous, Q Fri-HD, Ejigiri, Ogechi Grace, PA-C .  docusate sodium (COLACE) capsule 100 mg, 100 mg, Oral, BID, Rai, Ripudeep K, MD, 100 mg at 09/15/18 0828 .  feeding supplement (NEPRO CARB STEADY) liquid 237 mL, 237 mL, Oral, Q24H, Rai, Ripudeep K, MD, 237 mL at 09/14/18 1312 .  feeding supplement (PRO-STAT SUGAR FREE 64) liquid 30 mL, 30 mL, Oral, BID, Rai,  Ripudeep K, MD, 30 mL at 09/15/18 0828 .  ferric gluconate (NULECIT) 62.5 mg in sodium chloride 0.9 % 100 mL IVPB, 62.5   mg, Intravenous, Q Wed-HD, Rai, Ripudeep K, MD, Last Rate: 105 mL/hr at 09/13/18 1300 .  [START ON 09/20/2018] ferric gluconate (NULECIT) 62.5 mg in sodium chloride 0.9 % 100 mL IVPB, 62.5 mg, Intravenous, Q Wed-HD, Bergman, Martha, PA-C .  fluticasone (FLONASE) 50 MCG/ACT nasal spray 1 spray, 1 spray, Each Nare, Daily, Rai, Ripudeep K, MD, 1 spray at 09/14/18 0950 .  heparin injection 1,000 Units, 1,000 Units, Dialysis, PRN, Ejigiri, Ogechi Grace, PA-C .  heparin injection 1,800 Units, 20 Units/kg, Dialysis, PRN, Ejigiri, Ogechi Grace, PA-C .  heparin injection 5,000 Units, 5,000 Units, Subcutaneous, Q8H, Rai, Ripudeep K, MD, 5,000 Units at 09/15/18 0545 .  hydrALAZINE (APRESOLINE) injection 10 mg, 10 mg, Intravenous, Q4H PRN, Rai, Ripudeep K, MD, 10 mg at 09/09/18 0619 .  HYDROmorphone (DILAUDID) injection 1 mg, 1 mg, Intravenous, Q3H PRN, Rayburn, Shawn Montgomery, PA-C .  insulin aspart (novoLOG) injection 0-5 Units, 0-5 Units, Subcutaneous, QHS, Rai, Ripudeep K, MD .  insulin aspart (novoLOG) injection 0-9 Units, 0-9 Units, Subcutaneous, TID WC, Rai, Ripudeep K, MD, Stopped at 09/15/18 1251 .  lidocaine (PF) (XYLOCAINE) 1 % injection 5 mL, 5 mL, Intradermal, PRN, Rai, Ripudeep K, MD .  lidocaine-prilocaine (EMLA) cream 1 application, 1 application, Topical, PRN, Rai, Ripudeep K, MD .  MEDLINE mouth rinse, 15 mL, Mouth Rinse, q12n4p, Rai, Ripudeep K, MD, 15 mL at 09/10/18 1947 .  menthol-cetylpyridinium (CEPACOL) lozenge 3 mg, 1 lozenge, Oral, PRN, Duda, Marcus V, MD .  metoCLOPramide (REGLAN) tablet 5 mg, 5 mg, Oral, TID AC, Rai, Ripudeep K, MD, 5 mg at 09/15/18 0827 .  metoprolol succinate (TOPROL-XL) 24 hr tablet 50 mg, 50 mg, Oral, Daily, Rai, Ripudeep K, MD, Stopped at 09/15/18 0808 .  multivitamin (RENA-VIT) tablet 1 tablet, 1 tablet, Oral, QHS, Rai, Ripudeep K, MD, 1  tablet at 09/14/18 2143 .  mupirocin ointment (BACTROBAN) 2 %, , Nasal, BID, Rai, Ripudeep K, MD .  ondansetron (ZOFRAN) tablet 4 mg, 4 mg, Oral, Q6H PRN **OR** ondansetron (ZOFRAN) injection 4 mg, 4 mg, Intravenous, Q6H PRN, Rai, Ripudeep K, MD .  oxyCODONE (Oxy IR/ROXICODONE) immediate release tablet 5-10 mg, 5-10 mg, Oral, Q4H PRN, Rayburn, Shawn Montgomery, PA-C .  pantoprazole (PROTONIX) EC tablet 40 mg, 40 mg, Oral, Q0600, Rai, Ripudeep K, MD, 40 mg at 09/15/18 0546 .  pentafluoroprop-tetrafluoroeth (GEBAUERS) aerosol 1 application, 1 application, Topical, PRN, Rai, Ripudeep K, MD .  timolol (TIMOPTIC) 0.5 % ophthalmic solution 1 drop, 1 drop, Both Eyes, BID, Rai, Ripudeep K, MD, 1 drop at 09/14/18 2150  Patients Current Diet:  Diet Order            Diet renal/carb modified with fluid restriction Diet-HS Snack? Nothing; Fluid restriction: 1200 mL Fluid; Room service appropriate? No; Fluid consistency: Thin  Diet effective now              Precautions / Restrictions Precautions Precautions: Fall Restrictions Weight Bearing Restrictions: Yes RLE Weight Bearing: Non weight bearing LLE Weight Bearing: Non weight bearing   Has the patient had 2 or more falls or a fall with injury in the past year? Yes  Prior Activity Level Limited Community (1-2x/wk): has been a primary w/c user since his R AKA in May 2019, was on rehab/acute/rehab 5/18-6/10  Prior Functional Level Self Care: Did the patient need help bathing, dressing, using the toilet or eating? Needed some help  Indoor Mobility: Did the patient need assistance with walking from room to room (with or without device)? Needed   some help  Stairs: Did the patient need assistance with internal or external stairs (with or without device)? Needed some help  Functional Cognition: Did the patient need help planning regular tasks such as shopping or remembering to take medications? Needed some help  Home Assistive Devices /  Equipment Home Assistive Devices/Equipment: CBG Meter, Oxygen, Wheelchair Home Equipment: Environmental consultant - 2 wheels, Wheelchair - manual, Bedside commode, Shower seat(sliding board)  Prior Device Use: Indicate devices/aids used by the patient prior to current illness, exacerbation or injury? Manual wheelchair and slide board  Current Functional Level Cognition  Overall Cognitive Status: Impaired/Different from baseline Orientation Level: Oriented X4 Following Commands: Follows one step commands consistently, Follows one step commands with increased time Safety/Judgement: Decreased awareness of safety    Extremity Assessment (includes Sensation/Coordination)  Upper Extremity Assessment: Generalized weakness  Lower Extremity Assessment: Defer to PT evaluation, RLE deficits/detail, LLE deficits/detail RLE Deficits / Details: AKA LLE Deficits / Details: AKA    ADLs  Overall ADL's : Needs assistance/impaired Eating/Feeding: Set up, Sitting Grooming: Set up, Sitting Upper Body Bathing: Min guard, Sitting Lower Body Bathing: Min guard, Sitting/lateral leans Upper Body Dressing : Min guard, Sitting Lower Body Dressing: Maximal assistance, Sitting/lateral leans Toilet Transfer: Maximal assistance, +2 for physical assistance, +2 for safety/equipment, Transfer board Toilet Transfer Details (indicate cue type and reason): simulated transfer from EOB to recliner  Toileting- Clothing Manipulation and Hygiene: Maximal assistance, +2 for physical assistance, +2 for safety/equipment, Sitting/lateral lean Functional mobility during ADLs: Maximal assistance, +2 for physical assistance, +2 for safety/equipment, Wheelchair    Mobility  Overal bed mobility: Needs Assistance Bed Mobility: Supine to Sit Supine to sit: Max assist, +2 for physical assistance General bed mobility comments: required use of bed pad and HOB elevated to come to EOB    Transfers  Overall transfer level: Needs assistance Transfers:  Lateral/Scoot Transfers  Lateral/Scoot Transfers: Max assist, Total assist, +2 physical assistance General transfer comment: Max/Total A +2 for lateral transfer to recliner towards R side - attempted to have patient push up from B UE with limited success - use of bed pad to assist    Ambulation / Gait / Stairs / Wheelchair Mobility       Posture / Balance Balance Overall balance assessment: Needs assistance Sitting-balance support: Bilateral upper extremity supported Sitting balance-Leahy Scale: Fair    Special needs/care consideration BiPAP/CPAP no CPM no Continuous Drip IV no Dialysis yes        Days MWF Life Vest no Oxygen 2 L via nasal cannual Special Bed no Trach Size no Wound Vac (area) yes      Location L AKA Skin general ecchymosis, healing incision to R AKA, new L AKA                        Bowel mgmt: continent, last BM 09/15/2018 Bladder mgmt: anuric Diabetic mgmt: yes Behavioral consideration no Chemo/radiation no   Previous Home Environment (from acute therapy documentation) Living Arrangements: Spouse/significant other, Children  Lives With: Spouse Available Help at Discharge: Family, Available 24 hours/day Type of Home: Apartment Home Layout: One level Home Access: Level entry(ground level) Bathroom Shower/Tub: Chiropodist: Standard Home Care Services: Yes Type of Home Care Services: Home OT, Home PT  Discharge Living Setting Plans for Discharge Living Setting: Patient's home Type of Home at Discharge: Apartment Discharge Home Layout: One level Discharge Home Access: Level entry Discharge Bathroom Shower/Tub: Tub/shower unit Discharge Bathroom Toilet: Standard Discharge Bathroom  Accessibility: Yes How Accessible: Accessible via wheelchair Does the patient have any problems obtaining your medications?: No  Social/Family/Support Systems Patient Roles: Spouse, Parent Anticipated Caregiver: [name], spouse Anticipated Caregiver's  Contact Information: [phone] Ability/Limitations of Caregiver: min assist, has been providing assist since recent d/c from CIR Caregiver Availability: 24/7 Discharge Plan Discussed with Primary Caregiver: Yes Is Caregiver In Agreement with Plan?: Yes Does Caregiver/Family have Issues with Lodging/Transportation while Pt is in Rehab?: No  Goals/Additional Needs Patient/Family Goal for Rehab: PT/OT supervision w/c level, SLP supervision Expected length of stay: 14-16 days Dietary Needs: renal/carb modified, 1200 mL fluid restriction Equipment Needs: will likely need new w/c with adjusted rear axel 2/2 now a bilat AKA Pt/Family Agrees to Admission and willing to participate: Yes Program Orientation Provided & Reviewed with Pt/Caregiver Including Roles  & Responsibilities: Yes  Decrease burden of Care through IP rehab admission: n/a  Possible need for SNF placement upon discharge: not anticipated   Patient Condition: I have reviewed medical records from [facility], spoken with CM, and patient and spouse. I met with patient at the bedside and discussed with spouse via phone for inpatient rehabilitation assessment.  Patient will benefit from ongoing PT, OT and SLP, can actively participate in 3 hours of therapy a day 5 days of the week, and can make measurable gains during the admission.  Patient will also benefit from the coordinated team approach during an Inpatient Acute Rehabilitation admission.  The patient will receive intensive therapy as well as Rehabilitation physician, nursing, social worker, and care management interventions.  Due to safety, skin/wound care, disease management, medication administration, pain management and patient education the patient requires 24 hour a day rehabilitation nursing.  The patient is currently max +2 with mobility and basic ADLs.  Discharge setting and therapy post discharge at home with home health is anticipated.  Patient has agreed to  participate in the Acute Inpatient Rehabilitation Program and will admit Saturday, September 16, 2018.  Preadmission Screen Completed By:  Caitlin E Warren, PT, DPT 09/15/2018 1:32 PM ______________________________________________________________________   Discussed status with Dr. Swartz on 09/15/18  at 2:04 PM  and received approval for admission today.  Admission Coordinator:  Caitlin E Warren, PT, DPT time 2:05 PM /Date 09/15/18    Assessment/Plan: Diagnosis: left AKA 1. Does the need for close, 24 hr/day Medical supervision in concert with the patient's rehab needs make it unreasonable for this patient to be served in a less intensive setting? Yes 2. Co-Morbidities requiring supervision/potential complications: ESRD, HTN, prior CVA 3. Due to bladder management, bowel management, safety, skin/wound care, disease management, medication administration, pain management and patient education, does the patient require 24 hr/day rehab nursing? Yes 4. Does the patient require coordinated care of a physician, rehab nurse, PT (1-2 hrs/day, 5 days/week), OT (1-2 hrs/day, 5 days/week) and SLP (1-2 hrs/day, 5 days/week) to address physical and functional deficits in the context of the above medical diagnosis(es)? Yes Addressing deficits in the following areas: balance, endurance, locomotion, strength, transferring, bowel/bladder control, bathing, dressing, feeding, grooming, toileting, cognition and psychosocial support 5. Can the patient actively participate in an intensive therapy program of at least 3 hrs of therapy 5 days a week? Yes 6. The potential for patient to make measurable gains while on inpatient rehab is excellent 7. Anticipated functional outcomes upon discharge from inpatients are: supervision PT, supervision OT, supervision SLP 8. Estimated rehab length of stay to reach the above functional goals is: 14-16 days 9. Anticipated D/C setting: Home   10. Anticipated post D/C treatments: HH  therapy 11. Overall Rehab/Functional Prognosis: excellent  MD Signature: Zachary T. Swartz, MD, FAAPMR [facility] 09/16/2018  

## 2018-09-15 NOTE — Progress Notes (Signed)
   09/15/18 1651  Pressure Injury 09/15/18 Coccyx Medial Stage II -  Partial thickness loss of dermis presenting as a shallow open ulcer with a red, pink wound bed without slough. shallow skin tear-like  Date First Assessed/Time First Assessed: 09/15/18 1646   Location: Coccyx  Location Orientation: Medial  Staging: Stage II -  Partial thickness loss of dermis presenting as a shallow open ulcer with a red, pink wound bed without slough.  Wound Descrip...  Dressing Type Foam - Lift dressing to assess site every shift  Dressing Clean;Dry;Intact (new)  Site / Wound Assessment Pink  Peri-wound Assessment Intact  Drainage Amount Scant  Drainage Description Serosanguineous  Treatment Cleansed;Off loading   Pt called me into room, said he thought he might have a sore on his bottom. Pt has sacral dressing intact - this wound is at the cleft of buttocks, appears like a skin tear. Wound cleansed and pick foam dressing applied. Discussed need to shift pressure frequently to patient who verbalized understanding but may need reinforcement. Pillow placed under pt's left side.

## 2018-09-15 NOTE — Progress Notes (Signed)
Inpatient Rehab Admissions Coordinator:   Spoke with [place], [place] for orthopedics and pt is stable for d/c to CIR tomorrow (Saturday). Will plan for admission tomorrow, pending pt remains stable.  Dr. Naaman Plummer will approve admission in the morning, and pt's RN can call rehab charge nurse at [phone] by noon for report.    Shann Medal, PT, DPT Admissions Coordinator [phone] 09/15/18  1:25 PM

## 2018-09-15 NOTE — Progress Notes (Signed)
Pt back from dialysis. Pt reports 10/10 pain in leg, given po oxycodone. Will monitor

## 2018-09-15 NOTE — Progress Notes (Signed)
Subjective: 7 Days Post-Op Procedure(s) (LRB): LEFT ABOVE KNEE AMPUTATION (Left) Application Of Wound Vac (Left) Patient reports pain as mild and moderate.    Objective: Vital signs in last 24 hours: Temp:  [98.2 F (36.8 C)-99 F (37.2 C)] 98.2 F (36.8 C) (06/26 9150) Pulse Rate:  [82-88] 85 (06/26 0634) Resp:  [16-18] 16 (06/26 5697) BP: (96-159)/(49-85) 143/49 (06/26 0634) SpO2:  [96 %-99 %] 96 % (06/26 9480)  Intake/Output from previous day: 06/25 0701 - 06/26 0700 In: 480 [P.O.:480] Out: -  Intake/Output this shift: No intake/output data recorded.  Recent Labs    09/13/18 0257 09/14/18 0319 09/15/18 0721  HGB 7.4* 7.3* 7.6*   Recent Labs    09/14/18 0319 09/15/18 0721  WBC 5.7 6.1  RBC 2.44* 2.57*  HCT 23.9* 24.6*  PLT 201 203   Recent Labs    09/14/18 0319 09/15/18 0721  NA 133* 130*  K 3.7 4.2  CL 96* 92*  CO2 26 25  BUN 37* 58*  CREATININE 6.87* 10.27*  GLUCOSE 216* 157*  CALCIUM 8.5* 9.1   No results for input(s): LABPT, INR in the last 72 hours.  Left Above the knee amputation site with VAC dressing in place and functioning well. VAC canister with slightly more than 50 cc.    Assessment/Plan: 7 Days Post-Op Procedure(s) (LRB): LEFT ABOVE KNEE AMPUTATION (Left) Application Of Wound Vac (Left) Continue VAC therapy.  Hopefully to CIR later today.   Howard Hong, PA-C 09/15/2018, 8:55 AM  [place]

## 2018-09-16 ENCOUNTER — Encounter (HOSPITAL_COMMUNITY): Payer: Self-pay

## 2018-09-16 ENCOUNTER — Inpatient Hospital Stay (HOSPITAL_COMMUNITY)
Admission: AD | Admit: 2018-09-16 | Discharge: 2018-09-29 | DRG: 559 | Disposition: A | Discharge status: Home Health | Payer: Medicare Other | Source: Intra-hospital | Attending: Physical Medicine & Rehabilitation | Admitting: Physical Medicine & Rehabilitation

## 2018-09-16 ENCOUNTER — Other Ambulatory Visit: Payer: Self-pay

## 2018-09-16 DIAGNOSIS — L98499 Non-pressure chronic ulcer of skin of other sites with unspecified severity: Secondary | ICD-10-CM

## 2018-09-16 DIAGNOSIS — R7309 Other abnormal glucose: Secondary | ICD-10-CM | POA: Diagnosis not present

## 2018-09-16 DIAGNOSIS — E1169 Type 2 diabetes mellitus with other specified complication: Secondary | ICD-10-CM

## 2018-09-16 DIAGNOSIS — E669 Obesity, unspecified: Secondary | ICD-10-CM

## 2018-09-16 DIAGNOSIS — Z89612 Acquired absence of left leg above knee: Secondary | ICD-10-CM | POA: Diagnosis not present

## 2018-09-16 DIAGNOSIS — E8889 Other specified metabolic disorders: Secondary | ICD-10-CM | POA: Diagnosis present

## 2018-09-16 DIAGNOSIS — E1143 Type 2 diabetes mellitus with diabetic autonomic (poly)neuropathy: Secondary | ICD-10-CM | POA: Diagnosis not present

## 2018-09-16 DIAGNOSIS — N2581 Secondary hyperparathyroidism of renal origin: Secondary | ICD-10-CM | POA: Diagnosis not present

## 2018-09-16 DIAGNOSIS — E11319 Type 2 diabetes mellitus with unspecified diabetic retinopathy without macular edema: Secondary | ICD-10-CM | POA: Diagnosis present

## 2018-09-16 DIAGNOSIS — N185 Chronic kidney disease, stage 5: Secondary | ICD-10-CM | POA: Diagnosis present

## 2018-09-16 DIAGNOSIS — K3184 Gastroparesis: Secondary | ICD-10-CM | POA: Diagnosis present

## 2018-09-16 DIAGNOSIS — I10 Essential (primary) hypertension: Secondary | ICD-10-CM

## 2018-09-16 DIAGNOSIS — E1151 Type 2 diabetes mellitus with diabetic peripheral angiopathy without gangrene: Secondary | ICD-10-CM | POA: Diagnosis present

## 2018-09-16 DIAGNOSIS — I739 Peripheral vascular disease, unspecified: Secondary | ICD-10-CM

## 2018-09-16 DIAGNOSIS — I120 Hypertensive chronic kidney disease with stage 5 chronic kidney disease or end stage renal disease: Secondary | ICD-10-CM | POA: Diagnosis not present

## 2018-09-16 DIAGNOSIS — J9621 Acute and chronic respiratory failure with hypoxia: Secondary | ICD-10-CM | POA: Diagnosis present

## 2018-09-16 DIAGNOSIS — E1152 Type 2 diabetes mellitus with diabetic peripheral angiopathy with gangrene: Secondary | ICD-10-CM | POA: Diagnosis present

## 2018-09-16 DIAGNOSIS — D649 Anemia, unspecified: Secondary | ICD-10-CM | POA: Diagnosis not present

## 2018-09-16 DIAGNOSIS — E1142 Type 2 diabetes mellitus with diabetic polyneuropathy: Secondary | ICD-10-CM

## 2018-09-16 DIAGNOSIS — Z89611 Acquired absence of right leg above knee: Secondary | ICD-10-CM | POA: Diagnosis not present

## 2018-09-16 DIAGNOSIS — G546 Phantom limb syndrome with pain: Secondary | ICD-10-CM | POA: Diagnosis not present

## 2018-09-16 DIAGNOSIS — J302 Other seasonal allergic rhinitis: Secondary | ICD-10-CM | POA: Diagnosis present

## 2018-09-16 DIAGNOSIS — Z794 Long term (current) use of insulin: Secondary | ICD-10-CM | POA: Diagnosis not present

## 2018-09-16 DIAGNOSIS — Z992 Dependence on renal dialysis: Secondary | ICD-10-CM | POA: Diagnosis not present

## 2018-09-16 DIAGNOSIS — H409 Unspecified glaucoma: Secondary | ICD-10-CM | POA: Diagnosis present

## 2018-09-16 DIAGNOSIS — R0902 Hypoxemia: Secondary | ICD-10-CM | POA: Diagnosis present

## 2018-09-16 DIAGNOSIS — N186 End stage renal disease: Secondary | ICD-10-CM | POA: Diagnosis not present

## 2018-09-16 DIAGNOSIS — D631 Anemia in chronic kidney disease: Secondary | ICD-10-CM | POA: Diagnosis present

## 2018-09-16 DIAGNOSIS — E1122 Type 2 diabetes mellitus with diabetic chronic kidney disease: Secondary | ICD-10-CM | POA: Diagnosis present

## 2018-09-16 DIAGNOSIS — Z7951 Long term (current) use of inhaled steroids: Secondary | ICD-10-CM

## 2018-09-16 DIAGNOSIS — Z9981 Dependence on supplemental oxygen: Secondary | ICD-10-CM

## 2018-09-16 DIAGNOSIS — Z4781 Encounter for orthopedic aftercare following surgical amputation: Secondary | ICD-10-CM | POA: Diagnosis not present

## 2018-09-16 DIAGNOSIS — Z8249 Family history of ischemic heart disease and other diseases of the circulatory system: Secondary | ICD-10-CM | POA: Diagnosis not present

## 2018-09-16 DIAGNOSIS — Z833 Family history of diabetes mellitus: Secondary | ICD-10-CM

## 2018-09-16 DIAGNOSIS — S78112A Complete traumatic amputation at level between left hip and knee, initial encounter: Secondary | ICD-10-CM | POA: Diagnosis present

## 2018-09-16 DIAGNOSIS — G4733 Obstructive sleep apnea (adult) (pediatric): Secondary | ICD-10-CM | POA: Diagnosis not present

## 2018-09-16 DIAGNOSIS — L8990 Pressure ulcer of unspecified site, unspecified stage: Secondary | ICD-10-CM | POA: Insufficient documentation

## 2018-09-16 DIAGNOSIS — E1129 Type 2 diabetes mellitus with other diabetic kidney complication: Secondary | ICD-10-CM | POA: Diagnosis not present

## 2018-09-16 DIAGNOSIS — J9612 Chronic respiratory failure with hypercapnia: Secondary | ICD-10-CM

## 2018-09-16 DIAGNOSIS — M79605 Pain in left leg: Secondary | ICD-10-CM | POA: Diagnosis not present

## 2018-09-16 DIAGNOSIS — R0989 Other specified symptoms and signs involving the circulatory and respiratory systems: Secondary | ICD-10-CM | POA: Diagnosis not present

## 2018-09-16 DIAGNOSIS — G8918 Other acute postprocedural pain: Secondary | ICD-10-CM | POA: Diagnosis not present

## 2018-09-16 DIAGNOSIS — Z79899 Other long term (current) drug therapy: Secondary | ICD-10-CM

## 2018-09-16 LAB — GLUCOSE, CAPILLARY
Glucose-Capillary: 142 mg/dL — ABNORMAL HIGH (ref 70–99)
Glucose-Capillary: 226 mg/dL — ABNORMAL HIGH (ref 70–99)

## 2018-09-16 MED ORDER — SODIUM CHLORIDE 0.9 % IV SOLN
62.5000 mg | INTRAVENOUS | Status: DC
  Administered 2018-09-20 – 2018-09-27 (×2): 62.5 mg via INTRAVENOUS
  Filled 2018-09-16 (×3): qty 5

## 2018-09-16 MED ORDER — PRO-STAT SUGAR FREE PO LIQD
30.0000 mL | Freq: Two times a day (BID) | ORAL | Status: DC
  Administered 2018-09-19 – 2018-09-28 (×10): 30 mL via ORAL
  Filled 2018-09-16 (×23): qty 30

## 2018-09-16 MED ORDER — PROCHLORPERAZINE EDISYLATE 10 MG/2ML IJ SOLN: 5.0000 mg | Freq: Four times a day (QID) | INTRAMUSCULAR | Status: DC | PRN

## 2018-09-16 MED ORDER — TRAZODONE HCL 50 MG PO TABS
25.0000 mg | ORAL_TABLET | Freq: Every evening | ORAL | Status: DC | PRN
  Administered 2018-09-16: 50 mg via ORAL
  Filled 2018-09-16: qty 1

## 2018-09-16 MED ORDER — CHLORHEXIDINE GLUCONATE CLOTH 2 % EX PADS: 6.0000 | MEDICATED_PAD | Freq: Every day | CUTANEOUS | Status: DC

## 2018-09-16 MED ORDER — TIMOLOL MALEATE 0.5 % OP SOLN
1.0000 [drp] | Freq: Two times a day (BID) | OPHTHALMIC | Status: DC
  Administered 2018-09-16 – 2018-09-28 (×25): 1 [drp] via OPHTHALMIC
  Filled 2018-09-16: qty 5

## 2018-09-16 MED ORDER — PENTAFLUOROPROP-TETRAFLUOROETH EX AERO: 1.0000 "application " | INHALATION_SPRAY | CUTANEOUS | Status: DC | PRN

## 2018-09-16 MED ORDER — DOCUSATE SODIUM 100 MG PO CAPS
100.0000 mg | ORAL_CAPSULE | Freq: Two times a day (BID) | ORAL | Status: DC
  Administered 2018-09-16 – 2018-09-28 (×24): 100 mg via ORAL
  Filled 2018-09-16 (×25): qty 1

## 2018-09-16 MED ORDER — FLUTICASONE PROPIONATE 50 MCG/ACT NA SUSP
1.0000 | Freq: Every day | NASAL | Status: DC
  Administered 2018-09-17 – 2018-09-29 (×10): 1 via NASAL
  Filled 2018-09-16: qty 16

## 2018-09-16 MED ORDER — AMLODIPINE BESYLATE 10 MG PO TABS
10.0000 mg | ORAL_TABLET | Freq: Every day | ORAL | Status: DC
  Administered 2018-09-17 – 2018-09-29 (×12): 10 mg via ORAL
  Filled 2018-09-16 (×13): qty 1

## 2018-09-16 MED ORDER — CALCIUM ACETATE (PHOS BINDER) 667 MG PO CAPS
1334.0000 mg | ORAL_CAPSULE | Freq: Three times a day (TID) | ORAL | Status: DC
  Administered 2018-09-17 – 2018-09-19 (×7): 1334 mg via ORAL
  Filled 2018-09-16 (×11): qty 2

## 2018-09-16 MED ORDER — PROCHLORPERAZINE MALEATE 5 MG PO TABS: 5.0000 mg | ORAL_TABLET | Freq: Four times a day (QID) | ORAL | Status: DC | PRN

## 2018-09-16 MED ORDER — POLYETHYLENE GLYCOL 3350 17 G PO PACK: 17.0000 g | PACK | Freq: Every day | ORAL | Status: DC | PRN

## 2018-09-16 MED ORDER — HEPARIN SODIUM (PORCINE) 5000 UNIT/ML IJ SOLN: 5000.0000 [IU] | Freq: Three times a day (TID) | INTRAMUSCULAR | Status: DC

## 2018-09-16 MED ORDER — NEPRO/CARBSTEADY PO LIQD
237.0000 mL | ORAL | Status: DC
  Administered 2018-09-18 – 2018-09-28 (×8): 237 mL via ORAL
  Filled 2018-09-16: qty 237

## 2018-09-16 MED ORDER — ACETAMINOPHEN 325 MG PO TABS: 325.0000 mg | ORAL_TABLET | ORAL | Status: DC | PRN

## 2018-09-16 MED ORDER — PANTOPRAZOLE SODIUM 40 MG PO TBEC
40.0000 mg | DELAYED_RELEASE_TABLET | Freq: Every day | ORAL | Status: DC
  Administered 2018-09-17 – 2018-09-29 (×13): 40 mg via ORAL
  Filled 2018-09-16 (×13): qty 1

## 2018-09-16 MED ORDER — HEPARIN SODIUM (PORCINE) 1000 UNIT/ML DIALYSIS
1000.0000 [IU] | INTRAMUSCULAR | Status: DC | PRN
  Filled 2018-09-16: qty 1

## 2018-09-16 MED ORDER — DIPHENHYDRAMINE HCL 12.5 MG/5ML PO ELIX
12.5000 mg | ORAL_SOLUTION | Freq: Four times a day (QID) | ORAL | Status: DC | PRN
  Administered 2018-09-17: 25 mg via ORAL
  Filled 2018-09-16: qty 10

## 2018-09-16 MED ORDER — MUPIROCIN 2 % EX OINT
TOPICAL_OINTMENT | Freq: Two times a day (BID) | CUTANEOUS | Status: DC
  Administered 2018-09-17 – 2018-09-24 (×13): via NASAL
  Administered 2018-09-25 – 2018-09-27 (×4): 1 via NASAL
  Administered 2018-09-27 – 2018-09-28 (×3): via NASAL
  Filled 2018-09-16: qty 22

## 2018-09-16 MED ORDER — PROCHLORPERAZINE 25 MG RE SUPP: 12.5000 mg | Freq: Four times a day (QID) | RECTAL | Status: DC | PRN

## 2018-09-16 MED ORDER — ALBUTEROL SULFATE (2.5 MG/3ML) 0.083% IN NEBU: 2.5000 mg | INHALATION_SOLUTION | RESPIRATORY_TRACT | Status: DC | PRN

## 2018-09-16 MED ORDER — BISACODYL 10 MG RE SUPP: 10.0000 mg | Freq: Every day | RECTAL | Status: DC | PRN

## 2018-09-16 MED ORDER — OXYCODONE HCL 5 MG PO TABS
5.0000 mg | ORAL_TABLET | ORAL | Status: DC | PRN
  Administered 2018-09-16 – 2018-09-18 (×6): 10 mg via ORAL
  Filled 2018-09-16 (×9): qty 2

## 2018-09-16 MED ORDER — ONDANSETRON HCL 4 MG/2ML IJ SOLN: 4.0000 mg | Freq: Four times a day (QID) | INTRAMUSCULAR | Status: DC | PRN

## 2018-09-16 MED ORDER — METOPROLOL SUCCINATE ER 50 MG PO TB24
50.0000 mg | ORAL_TABLET | Freq: Every day | ORAL | Status: DC
  Administered 2018-09-17 – 2018-09-29 (×12): 50 mg via ORAL
  Filled 2018-09-16 (×13): qty 1

## 2018-09-16 MED ORDER — INSULIN ASPART 100 UNIT/ML ~~LOC~~ SOLN
0.0000 [IU] | Freq: Every day | SUBCUTANEOUS | Status: DC
  Administered 2018-09-16 – 2018-09-28 (×2): 2 [IU] via SUBCUTANEOUS

## 2018-09-16 MED ORDER — DARBEPOETIN ALFA 200 MCG/0.4ML IJ SOSY: 200.0000 ug | PREFILLED_SYRINGE | INTRAMUSCULAR | Status: DC

## 2018-09-16 MED ORDER — HEPARIN SODIUM (PORCINE) 1000 UNIT/ML DIALYSIS
20.0000 [IU]/kg | INTRAMUSCULAR | Status: DC | PRN
  Filled 2018-09-16: qty 2

## 2018-09-16 MED ORDER — METOCLOPRAMIDE HCL 5 MG PO TABS
5.0000 mg | ORAL_TABLET | Freq: Three times a day (TID) | ORAL | Status: DC
  Administered 2018-09-16 – 2018-09-29 (×37): 5 mg via ORAL
  Filled 2018-09-16 (×40): qty 1

## 2018-09-16 MED ORDER — ONDANSETRON HCL 4 MG PO TABS: 4.0000 mg | ORAL_TABLET | Freq: Four times a day (QID) | ORAL | Status: DC | PRN

## 2018-09-16 MED ORDER — INSULIN ASPART 100 UNIT/ML ~~LOC~~ SOLN
0.0000 [IU] | Freq: Three times a day (TID) | SUBCUTANEOUS | Status: DC
  Administered 2018-09-16: 3 [IU] via SUBCUTANEOUS
  Administered 2018-09-17 – 2018-09-18 (×2): 2 [IU] via SUBCUTANEOUS
  Administered 2018-09-19 (×2): 1 [IU] via SUBCUTANEOUS
  Administered 2018-09-19 – 2018-09-20 (×2): 2 [IU] via SUBCUTANEOUS
  Administered 2018-09-21 (×2): 1 [IU] via SUBCUTANEOUS
  Administered 2018-09-21: 3 [IU] via SUBCUTANEOUS
  Administered 2018-09-22 – 2018-09-24 (×7): 1 [IU] via SUBCUTANEOUS
  Administered 2018-09-25: 2 [IU] via SUBCUTANEOUS
  Administered 2018-09-25 – 2018-09-26 (×4): 1 [IU] via SUBCUTANEOUS
  Administered 2018-09-27 – 2018-09-28 (×2): 2 [IU] via SUBCUTANEOUS
  Administered 2018-09-28 – 2018-09-29 (×2): 1 [IU] via SUBCUTANEOUS

## 2018-09-16 MED ORDER — GUAIFENESIN-DM 100-10 MG/5ML PO SYRP: 5.0000 mL | ORAL_SOLUTION | Freq: Four times a day (QID) | ORAL | Status: DC | PRN

## 2018-09-16 MED ORDER — BRIMONIDINE TARTRATE 0.2 % OP SOLN
1.0000 [drp] | Freq: Two times a day (BID) | OPHTHALMIC | Status: DC
  Administered 2018-09-16 – 2018-09-28 (×25): 1 [drp] via OPHTHALMIC
  Filled 2018-09-16: qty 5

## 2018-09-16 MED ORDER — MILK AND MOLASSES ENEMA
1.0000 | Freq: Every day | RECTAL | Status: DC | PRN
  Filled 2018-09-16: qty 240

## 2018-09-16 MED ORDER — MENTHOL 3 MG MT LOZG: 1.0000 | LOZENGE | OROMUCOSAL | Status: DC | PRN

## 2018-09-16 MED ORDER — HEPARIN SODIUM (PORCINE) 5000 UNIT/ML IJ SOLN
5000.0000 [IU] | Freq: Three times a day (TID) | INTRAMUSCULAR | Status: DC
  Administered 2018-09-16 – 2018-09-29 (×34): 5000 [IU] via SUBCUTANEOUS
  Filled 2018-09-16 (×34): qty 1

## 2018-09-16 MED ORDER — LIDOCAINE-PRILOCAINE 2.5-2.5 % EX CREA
1.0000 "application " | TOPICAL_CREAM | CUTANEOUS | Status: DC | PRN
  Filled 2018-09-16: qty 5

## 2018-09-16 MED ORDER — SODIUM CHLORIDE 0.9 % IV SOLN: 62.5000 mg | INTRAVENOUS | Status: DC

## 2018-09-16 MED ORDER — RENA-VITE PO TABS
1.0000 | ORAL_TABLET | Freq: Every day | ORAL | Status: DC
  Administered 2018-09-16 – 2018-09-28 (×13): 1 via ORAL
  Filled 2018-09-16 (×13): qty 1

## 2018-09-16 MED ORDER — CHLORHEXIDINE GLUCONATE 0.12 % MT SOLN
15.0000 mL | Freq: Two times a day (BID) | OROMUCOSAL | Status: DC
  Administered 2018-09-16 – 2018-09-28 (×22): 15 mL via OROMUCOSAL
  Filled 2018-09-16 (×24): qty 15

## 2018-09-16 NOTE — Progress Notes (Addendum)
Nsg Discharge Note  Admit Date:  09/08/2018 Discharge date: 09/16/2018   Howard Bell to be D/C'd CIR per MD order.  AVS completed.  Copy for chart, and copy for patient signed, and dated. Patient/caregiver able to verbalize understanding.  Discharge Medication: Allergies as of 09/16/2018   No Known Allergies     Medication List    STOP taking these medications   acetaminophen 325 MG tablet Commonly known as: TYLENOL   oxyCODONE 5 MG immediate release tablet Commonly known as: Oxy IR/ROXICODONE     TAKE these medications   Admelog SoloStar 100 UNIT/ML KwikPen Generic drug: insulin lispro Inject 1-9 Units into the skin 3 (three) times daily. If bs is 121-150=1 unit, 151-200=2 units, 201-250=3 units, 251-300=5 units, 301-350=7 units, 351-400=9 units   albuterol 108 (90 Base) MCG/ACT inhaler Commonly known as: VENTOLIN HFA Inhale 1 puff into the lungs every 4 (four) hours as needed for wheezing or shortness of breath.   brimonidine 0.2 % ophthalmic solution Commonly known as: ALPHAGAN Place 1 drop into both eyes 2 (two) times daily.   calcium acetate 667 MG capsule Commonly known as: PHOSLO Take 2 capsules (1,334 mg total) by mouth 3 (three) times daily with meals.   docusate sodium 100 MG capsule Commonly known as: COLACE Take 1 capsule (100 mg total) by mouth 2 (two) times daily.   fluticasone 50 MCG/ACT nasal spray Commonly known as: FLONASE Place 1 spray into both nostrils daily.   latanoprost 0.005 % ophthalmic solution Commonly known as: XALATAN Place 1 drop into both eyes at bedtime.   methocarbamol 500 MG tablet Commonly known as: ROBAXIN Take 0.5 tablets (250 mg total) by mouth 2 (two) times daily as needed for muscle spasms. What changed: how much to take   metoCLOPramide 5 MG tablet Commonly known as: Reglan Take 1 tablet (5 mg total) by mouth 3 (three) times daily before meals.   metoprolol succinate 50 MG 24 hr tablet Commonly known as:  TOPROL-XL Take 1 tablet (50 mg total) by mouth at bedtime. Take with a meal.   omeprazole 20 MG capsule Commonly known as: PRILOSEC Take 1 capsule (20 mg total) by mouth at bedtime.   sevelamer carbonate 800 MG tablet Commonly known as: RENVELA Take 1 tablet (800 mg total) by mouth 3 (three) times daily with meals.   silver sulfADIAZINE 1 % cream Commonly known as: SILVADENE Apply topically daily.   timolol 0.5 % ophthalmic solution Commonly known as: BETIMOL Place 1 drop into both eyes 2 (two) times daily.       Discharge Assessment: Vitals:   09/16/18 0842 09/16/18 1154  BP: (!) 172/91 (!) 145/82  Pulse: 90 96  Resp: 14 14  Temp: 99.1 F (37.3 C) 98.7 F (37.1 C)  SpO2: 93% 90%   Skin clean, dry and intact without evidence of skin break down, no evidence of skin tears noted. IV catheter discontinued intact. Site without signs and symptoms of complications - no redness or edema noted at insertion site, patient denies c/o pain - only slight tenderness at site.  Dressing with slight pressure applied.  D/c Instructions-Education: Discharge instructions given to patient/family with verbalized understanding. D/c education completed with patient/family including follow up instructions, medication list, d/c activities limitations if indicated, with other d/c instructions as indicated by MD - patient able to verbalize understanding, all questions fully answered. Patient instructed to return to ED, call 911, or call MD for any changes in condition. Spouse called to let her know that  patient had been moved Patient taken to 4M01 by bed, report given to RN  Salley Slaughter, RN 09/16/2018 1:37 PM

## 2018-09-16 NOTE — Progress Notes (Signed)
[facility] Progress Note   Dialysis Orders: AF MWF   4h 51mn  450/800  93kg  2/2.25 bath  Hep 9000  L AVG - parsabiv 10, iPTH 640 6/17  - Hb 8 at dc 6/10 on home O2 - Mircera 200 given 6/16 - tsat after last hosp d/c 37% - had been offParsabiv during recent hospitalization    Assessment/ Plan:   Assessment/Plan: 1. 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 Then went to CIR, then came back for AMS. Then went back to CIR. Then underwent L AKA 09/08/18 for multiple wounds not responding to Rx.  Now is bilat AKA.  2. ESRD- MWF HD. Next HD Monday - no problems Friday. 3. Hypertension/volume- BP's good which is approp for bilat AKA. Net UF 2 L 6/24 2 L: 6/26 to post wt 88.3- had BP drop towards the end of treatment Friday low Na levels suggest he will need more volume off - need to change to profile 4 at d/c 4. Anemia-hgb 7.6 trending up - some post op loss received 200 Mircera 6/16;  Resumed 200 Aranesp q Friday.. tsat 37% 6/17 - added weekly Fe  5. Metabolic bone disease- Holding parsabiv while here due to unavailability; resuming binders now as eating (phoslo) - Ca a little high without parsabiv - 6. Nutrition- chronic low alb./ prostat ordered 7. DM- per primary 8. Disp -plan for transfer to CIR today   [name] PA-C CCanavanas6/27/2020,10:17 AM  LOS: 8 days    Subjective:   Still pain from AKA  Objective Vitals:   09/15/18 2347 09/16/18 0100 09/16/18 0703 09/16/18 0842  BP: 135/75  (!) 167/82 (!) 172/91  Pulse: 88 89 85 90  Resp: _0 Temp: 98.3 F (36.8 C)  98.4 F (36.9 C) 99.1 F (37.3 C)  TempSrc: Oral  Oral Oral  SpO2: 96% 96% (!) 87% 93%  Weight:      Height:       Physical Exam General:NAD off o2 at present  Heart: RRR Lungs: grossly clear  Abdomen:obese soft NT Extremities: left AKA with VAC min edema  right AKA staples intact no sig edema Dialysis Access:  Left AVGG + bruit   Additional Objective Labs: Basic Metabolic Panel: Recent Labs  Lab 09/13/18 0257 09/14/18 0319 09/15/18 0721  NA 130* 133* 130*  K 4.3 3.7 4.2  CL 92* 96* 92*  CO2 _1 GLUCOSE 169* 216* 157*  BUN 46* 37* 58*  CREATININE 9.22* 6.87* 10.27*  CALCIUM 8.5* 8.5* 9.1   Liver Function Tests: Recent Labs  Lab 09/10/18 0506  AST 16  ALT 7  ALKPHOS 102  BILITOT 1.1  PROT 7.1  ALBUMIN 2.2*   No results for input(s): LIPASE, AMYLASE in the last 168 hours. CBC: Recent Labs  Lab 09/11/18 0422 09/12/18 0301 09/13/18 0257 09/14/18 0319 09/15/18 0721  WBC 5.9 6.8 6.2 5.7 6.1  NEUTROABS 3.3 4.3 3.2  --   --   HGB 7.4* 8.0* 7.4* 7.3* 7.6*  HCT 24.4* 25.9* 24.3* 23.9* 24.6*  MCV 99.2 99.6 97.2 98.0 95.7  PLT 204 213 201 201 203   Blood Culture    Component Value Date/Time   SDES BLOOD LEFT ANTECUBITAL 08/15/2018 1526   SPECREQUEST  08/15/2018 1526    BOTTLES DRAWN AEROBIC ONLY Blood Culture adequate volume   CULT  08/15/2018 1526    NO GROWTH 5 DAYS Performed at [facility], [place] [address] [place], [place] [address]    REPTSTATUS 08/20/2018 FINAL 08/15/2018 1526    Cardiac Enzymes: Recent Labs  Lab 09/10/18 0506  TROPONINI 0.05*   CBG: Recent Labs  Lab 09/14/18 2128 09/15/18 0744 09/15/18 1628 09/15/18 2346 09/16/18 0807  GLUCAP 158* 139* 225* 171* 142*   Iron Studies: No results for input(s): IRON, TIBC, TRANSFERRIN, FERRITIN in the last 72 hours. Lab Results  Component Value Date   INR 1.3 (H) 09/10/2018   INR 1.2 08/15/2018   Studies/Results: No results found. Medications: . sodium chloride Stopped (09/10/18 1325)  . sodium chloride    . sodium chloride    . sodium chloride    . sodium chloride    . ferric gluconate (FERRLECIT/NULECIT) IV 105 mL/hr at 09/13/18 1300  . [START ON 09/20/2018] ferric gluconate (FERRLECIT/NULECIT) IV     . amLODipine  10 mg  Oral Daily  . brimonidine  1 drop Both Eyes BID  . calcium acetate  1,334 mg Oral TID WC  . chlorhexidine  15 mL Mouth Rinse BID  . chlorhexidine gluconate (MEDLINE KIT)  15 mL Mouth Rinse BID  . Chlorhexidine Gluconate Cloth  6 each Topical Q0600  . darbepoetin (ARANESP) injection - DIALYSIS  200 mcg Intravenous Q Fri-HD  . docusate sodium  100 mg Oral BID  . feeding supplement (NEPRO CARB STEADY)  237 mL Oral Q24H  . feeding supplement (PRO-STAT SUGAR FREE 64)  30 mL Oral BID  . fluticasone  1 spray Each Nare Daily  . heparin injection (subcutaneous)  5,000 Units Subcutaneous Q8H  . insulin aspart  0-5 Units Subcutaneous QHS  . insulin aspart  0-9 Units Subcutaneous TID WC  . mouth rinse  15 mL Mouth Rinse q12n4p  . metoCLOPramide  5 mg Oral TID AC  . metoprolol succinate  50 mg Oral Daily  . multivitamin  1 tablet Oral QHS  . mupirocin ointment   Nasal BID  . pantoprazole  40 mg Oral Q0600  . timolol  1 drop Both Eyes BID

## 2018-09-16 NOTE — H&P (Signed)
Physical Medicine and Rehabilitation Admission H&P     CC: Functional deficits due to L-AKA/history of R-AKA   HPI: Howard Bell is a 53 year old male with history of T2DM with neuropathy and retinopathy, ESRD- HD ion MWF, OSA with chronic hypoxic respiratory failure--oxygen dependent, PVD with gangrenous changes BLE s/p R-AKA with recent CIR stay. He was admitted on 09/08/18 for L-AKA due to progressive gangrenous changes. Post op with acute on chronic respiratory failure and hypotension requiring pressors. He was extubated to BIPAP on 6/20 and mentation slowly improving.  He has been weaned off BIPAP and acute on chronic anemia being monitored. Therapy evaluations completed revealing decline in functional status and CIR recommended for follow up therapy.      Review of Systems  Constitutional: Negative for chills and fever.  HENT: Negative for hearing loss and tinnitus.   Eyes: Positive for blurred vision.  Respiratory: Negative for cough and shortness of breath.   Cardiovascular: Negative for chest pain and palpitations.  Gastrointestinal: Negative for constipation and heartburn.  Musculoskeletal: Negative for myalgias.  Skin: Negative for rash.  Neurological: Positive for weakness. Negative for dizziness and headaches.  Psychiatric/Behavioral: Positive for memory loss.          Past Medical History:  Diagnosis Date  . Arthritis    . Congestive heart failure (CHF) ([place])    . Depression    . Diabetes mellitus without complication (HCC)      insulin dependent  . Diabetic gastroparesis ([place]) 11/06/2017  . ESRD (end stage renal disease) on dialysis [facility])      M,W.F dialysis  . H/O seasonal allergies    . Hypertension    . Pneumonia    . Sleep apnea      not using it now, on continuous O2 2 L during the day, 3 L during the night          Past Surgical History:  Procedure Laterality Date  . AMPUTATION Right 05/14/2018    Procedure: AMPUTATION BELOW KNEE;  Surgeon:  Newt Minion, MD;  Location: [place];  Service: Orthopedics;  Laterality: Right;  . AMPUTATION Right 08/04/2018    Procedure: RIGHT ABOVE KNEE AMPUTATION;  Surgeon: Newt Minion, MD;  Location: [place];  Service: Orthopedics;  Laterality: Right;  . AMPUTATION Left 09/08/2018    Procedure: LEFT ABOVE KNEE AMPUTATION;  Surgeon: Newt Minion, MD;  Location: [place];  Service: Orthopedics;  Laterality: Left;  . APPLICATION OF WOUND VAC Left 09/08/2018    Procedure: Application Of Wound Vac;  Surgeon: Newt Minion, MD;  Location: [place];  Service: Orthopedics;  Laterality: Left;  . ESOPHAGOGASTRODUODENOSCOPY (EGD) WITH PROPOFOL N/A 12/05/2017    Procedure: ESOPHAGOGASTRODUODENOSCOPY (EGD) WITH PROPOFOL;  Surgeon: Doran Stabler, MD;  Location: [facility];  Service: Gastroenterology;  Laterality: N/A;  . HERNIA REPAIR      . IR AV DIALY SHUNT INTRO NEEDLE/INTRACATH INITIAL W/PTA/IMG LEFT   03/30/2017          Family History  Problem Relation Age of Onset  . Diabetes Mother    . Hypertension Father       Social History:  Married.  Per reports that he has never smoked. He has never used smokeless tobacco. He reports that he does not drink alcohol or use drugs.    Allergies: No Known Allergies            Medications Prior to Admission  Medication Sig Dispense  Refill  . acetaminophen (TYLENOL) 325 MG tablet Take 1-2 tablets (325-650 mg total) by mouth every 4 (four) hours as needed for mild pain.      Marland Kitchen albuterol (PROVENTIL HFA;VENTOLIN HFA) 108 (90 Base) MCG/ACT inhaler Inhale 1 puff into the lungs every 4 (four) hours as needed for wheezing or shortness of breath.       . brimonidine (ALPHAGAN) 0.2 % ophthalmic solution Place 1 drop into both eyes 2 (two) times daily.      . calcium acetate (PHOSLO) 667 MG capsule Take 2 capsules (1,334 mg total) by mouth 3 (three) times daily with meals. 180 capsule 1  . docusate sodium (COLACE) 100 MG capsule Take 1 capsule (100 mg total) by mouth 2 (two)  times daily. 60 capsule 1  . fluticasone (FLONASE) 50 MCG/ACT nasal spray Place 1 spray into both nostrils daily.      . insulin lispro (ADMELOG SOLOSTAR) 100 UNIT/ML KwikPen Inject 1-9 Units into the skin 3 (three) times daily. If bs is 121-150=1 unit, 151-200=2 units, 201-250=3 units, 251-300=5 units, 301-350=7 units, 351-400=9 units      . latanoprost (XALATAN) 0.005 % ophthalmic solution Place 1 drop into both eyes at bedtime.      . methocarbamol (ROBAXIN) 500 MG tablet Take 0.5 tablets (250 mg total) by mouth 2 (two) times daily as needed for muscle spasms. (Patient taking differently: Take 500 mg by mouth 2 (two) times daily as needed for muscle spasms. ) 30 tablet 0  . metoCLOPramide (REGLAN) 5 MG tablet Take 1 tablet (5 mg total) by mouth 3 (three) times daily before meals. 90 tablet 0  . metoprolol succinate (TOPROL-XL) 50 MG 24 hr tablet Take 1 tablet (50 mg total) by mouth at bedtime. Take with a meal. 30 tablet 0  . omeprazole (PRILOSEC) 20 MG capsule Take 1 capsule (20 mg total) by mouth at bedtime. 30 capsule 0  . oxyCODONE (OXY IR/ROXICODONE) 5 MG immediate release tablet Take 1 tablet (5 mg total) by mouth every 6 (six) hours as needed for severe pain. 30 tablet 0  . sevelamer carbonate (RENVELA) 800 MG tablet Take 1 tablet (800 mg total) by mouth 3 (three) times daily with meals. 90 tablet 0  . silver sulfADIAZINE (SILVADENE) 1 % cream Apply topically daily. 50 g 0  . timolol (BETIMOL) 0.5 % ophthalmic solution Place 1 drop into both eyes 2 (two) times daily. 10 mL 1     Drug Regimen Review  Drug regimen was reviewed and remains appropriate with no significant issues identified   Home: Home Living Family/patient expects to be discharged to:: Private residence Living Arrangements: Spouse/significant other, Children Available Help at Discharge: Family, Available 24 hours/day Type of Home: Apartment Home Access: Level entry(ground level) Home Layout: One level Bathroom  Shower/Tub: Chiropodist: Standard Home Equipment: Environmental consultant - 2 wheels, Wheelchair - manual, Bedside commode, Shower seat(sliding board)  Lives With: Spouse   Functional History: Prior Function Level of Independence: Needs assistance Gait / Transfers Assistance Needed: can get in/out of w/c by himself; ambulates short distances with RW; able to propel himself when in w/c ADL's / Homemaking Assistance Needed: requires setup assist for ADLs Comments: recent CIR discharge on 08/30/2018 - was at home receiving [facility] therapies   Functional Status:  Mobility: Bed Mobility Overal bed mobility: Needs Assistance Bed Mobility: Supine to Sit Supine to sit: Max assist, +2 for physical assistance General bed mobility comments: required use of bed pad and HOB elevated to  come to EOB Transfers Overall transfer level: Needs assistance Transfers: Lateral/Scoot Transfers  Lateral/Scoot Transfers: Max assist, Total assist, +2 physical assistance General transfer comment: Max/Total A +2 for lateral transfer to recliner towards R side - attempted to have patient push up from B UE with limited success - use of bed pad to assist       ADL: ADL Overall ADL's : Needs assistance/impaired Eating/Feeding: Set up, Sitting Grooming: Set up, Sitting Upper Body Bathing: Min guard, Sitting Lower Body Bathing: Min guard, Sitting/lateral leans Upper Body Dressing : Min guard, Sitting Lower Body Dressing: Maximal assistance, Sitting/lateral leans Toilet Transfer: Maximal assistance, +2 for physical assistance, +2 for safety/equipment, Transfer board Toilet Transfer Details (indicate cue type and reason): simulated transfer from EOB to recliner  Toileting- Clothing Manipulation and Hygiene: Maximal assistance, +2 for physical assistance, +2 for safety/equipment, Sitting/lateral lean Functional mobility during ADLs: Maximal assistance, +2 for physical assistance, +2 for safety/equipment, Wheelchair    Cognition: Cognition Overall Cognitive Status: Impaired/Different from baseline Orientation Level: Oriented X4 Cognition Arousal/Alertness: Awake/alert Behavior During Therapy: WFL for tasks assessed/performed Overall Cognitive Status: Impaired/Different from baseline Area of Impairment: Following commands, Safety/judgement, Problem solving Following Commands: Follows one step commands consistently, Follows one step commands with increased time Safety/Judgement: Decreased awareness of safety Problem Solving: Slow processing, Decreased initiation, Difficulty sequencing, Requires verbal cues, Requires tactile cues     Blood pressure (!) 151/54, pulse 93, temperature 98.9 F (37.2 C), temperature source Oral, resp. rate 13, height 6' (1.829 m), weight 88.3 kg, SpO2 100 %. Physical Exam  Nursing note and vitals reviewed. Constitutional: He is oriented to person, place, and time. He appears well-developed and well-nourished.  obese  Eyes: Pupils are equal, round, and reactive to light. EOM are normal.  Neck: Normal range of motion.  Cardiovascular: Normal rate and regular rhythm.  No murmur heard. Respiratory: Effort normal. No respiratory distress. He has no wheezes.  GI: Soft. He exhibits no distension. There is no abdominal tenderness.  Musculoskeletal:     Comments: Wound VAC on L-AKA. Right AKA still has sutures and staples in place.   Neurological: He is alert and oriented to person, place, and time.  UE 5/5. RLE can lift against gravity 3/5, LLE 2/5.   Skin:  Vac L AKA, Right AKA with staples, scab, area dry.  Psychiatric:  Pt cooperative, sl irritable      Lab Results Last 48 Hours        Results for orders placed or performed during the hospital encounter of 09/08/18 (from the past 48 hour(s))  Glucose, capillary     Status: Abnormal    Collection Time: 09/13/18  4:24 PM  Result Value Ref Range    Glucose-Capillary 239 (H) 70 - 99 mg/dL  Glucose, capillary     Status:  Abnormal    Collection Time: 09/13/18  9:16 PM  Result Value Ref Range    Glucose-Capillary 190 (H) 70 - 99 mg/dL  Basic metabolic panel     Status: Abnormal    Collection Time: 09/14/18  3:19 AM  Result Value Ref Range    Sodium 133 (L) 135 - 145 mmol/L    Potassium 3.7 3.5 - 5.1 mmol/L    Chloride 96 (L) 98 - 111 mmol/L    CO2 26 22 - 32 mmol/L    Glucose, Bld 216 (H) 70 - 99 mg/dL    BUN 37 (H) 6 - 20 mg/dL    Creatinine, Ser 6.87 (H) 0.61 - 1.24  mg/dL    Calcium 8.5 (L) 8.9 - 10.3 mg/dL    GFR calc non Af Amer 8 (L) >60 mL/min    GFR calc Af Amer 10 (L) >60 mL/min    Anion gap 11 5 - 15      Comment: Performed at [facility] [address] [place], [place] [address]  CBC     Status: Abnormal    Collection Time: 09/14/18  3:19 AM  Result Value Ref Range    WBC 5.7 4.0 - 10.5 K/uL    RBC 2.44 (L) 4.22 - 5.81 MIL/uL    Hemoglobin 7.3 (L) 13.0 - 17.0 g/dL    HCT 23.9 (L) 39.0 - 52.0 %    MCV 98.0 80.0 - 100.0 fL    MCH 29.9 26.0 - 34.0 pg    MCHC 30.5 30.0 - 36.0 g/dL    RDW 18.4 (H) 11.5 - 15.5 %    Platelets 201 150 - 400 K/uL    nRBC 0.0 0.0 - 0.2 %      Comment: Performed at [facility], [place] [address], [place], [place] [address]  Glucose, capillary     Status: Abnormal    Collection Time: 09/14/18  7:49 AM  Result Value Ref Range    Glucose-Capillary 115 (H) 70 - 99 mg/dL  Glucose, capillary     Status: Abnormal    Collection Time: 09/14/18  4:49 PM  Result Value Ref Range    Glucose-Capillary 150 (H) 70 - 99 mg/dL  Glucose, capillary     Status: Abnormal    Collection Time: 09/14/18  9:28 PM  Result Value Ref Range    Glucose-Capillary 158 (H) 70 - 99 mg/dL  CBC     Status: Abnormal    Collection Time: 09/15/18  7:21 AM  Result Value Ref Range    WBC 6.1 4.0 - 10.5 K/uL    RBC 2.57 (L) 4.22 - 5.81 MIL/uL    Hemoglobin 7.6 (L) 13.0 - 17.0 g/dL    HCT 24.6 (L) 39.0 - 52.0 %    MCV 95.7 80.0 - 100.0 fL    MCH 29.6 26.0 - 34.0 pg    MCHC 30.9  30.0 - 36.0 g/dL    RDW 17.9 (H) 11.5 - 15.5 %    Platelets 203 150 - 400 K/uL    nRBC 0.0 0.0 - 0.2 %      Comment: Performed at [facility], [place]. [address], [place], [place] [address]  Basic metabolic panel     Status: Abnormal    Collection Time: 09/15/18  7:21 AM  Result Value Ref Range    Sodium 130 (L) 135 - 145 mmol/L    Potassium 4.2 3.5 - 5.1 mmol/L    Chloride 92 (L) 98 - 111 mmol/L    CO2 25 22 - 32 mmol/L    Glucose, Bld 157 (H) 70 - 99 mg/dL    BUN 58 (H) 6 - 20 mg/dL    Creatinine, Ser 10.27 (H) 0.61 - 1.24 mg/dL    Calcium 9.1 8.9 - 10.3 mg/dL    GFR calc non Af Amer 5 (L) >60 mL/min    GFR calc Af Amer 6 (L) >60 mL/min    Anion gap 13 5 - 15      Comment: Performed at [facility] [address], [place], [place] [address]  Glucose, capillary     Status: Abnormal    Collection Time:  09/15/18  7:44 AM  Result Value Ref Range    Glucose-Capillary 139 (H) 70 - 99 mg/dL     Imaging Results (Last 48 hours)  No results found.           Medical Problem List and Plan: 1.  Functional deficits secondary to PAD, new left AKA (09/08/2018), recent right AKA (08/04/2018)             -admit to inpatient rehab 2.  Antithrombotics: -DVT/anticoagulation:  Pharmaceutical: Heparin             -antiplatelet therapy: N/A 3. Pain Management: Oxycodone prn 4. Mood: LCSW to follow for evaluation and support.              -antipsychotic agents: N/A 5. Neuropsych: This patient is not fully capable of making decisions on his own behalf. 6. Skin/Wound Care: Continue wound VAC LLE             -staples R AKA probably ready to come out soon 7. Fluids/Electrolytes/Nutrition: Strict I/O. Daily weights. Labs with HD. 8. OSA/Chronic respiratory failure: Oxygen dependent--2 L during the day and 3 liters at nights. 9. HTN: Monitor BP TID--On Norvasc and metoprolol daily 10. T2DM: Brittle diabetic. Continue SSI as at home. Reglan tid ac for gastroparesis.  11. Glaucoma with poor  vision: On Timoptic at bedtime and Alphagan bid 12. Acute on chronic anemia: On Aranesp weekly.       Post Admission Physician Evaluation: 1. Functional deficits secondary  to left AKA. 2. Patient is admitted to receive collaborative, interdisciplinary care between the physiatrist, rehab nursing staff, and therapy team. 3. Patient's level of medical complexity and substantial therapy needs in context of that medical necessity cannot be provided at a lesser intensity of care such as a SNF. 4. Patient has experienced substantial functional loss from his/her baseline which was documented above under the "Functional History" and "Functional Status" headings.  Judging by the patient's diagnosis, physical exam, and functional history, the patient has potential for functional progress which will result in measurable gains while on inpatient rehab.  These gains will be of substantial and practical use upon discharge  in facilitating mobility and self-care at the household level. 5. Physiatrist will provide 24 hour management of medical needs as well as oversight of the therapy plan/treatment and provide guidance as appropriate regarding the interaction of the two. 6. The Preadmission Screening has been reviewed and patient status is unchanged unless otherwise stated above. 7. 24 hour rehab nursing will assist with bladder management, bowel management, safety, skin/wound care, disease management, medication administration, pain management and patient education  and help integrate therapy concepts, techniques,education, etc. 8. PT will assess and treat for/with: Lower extremity strength, range of motion, stamina, balance, functional mobility, safety, adaptive techniques and equipment, pain mgt, stump care.   Goals are: supervision w/c level. 9. OT will assess and treat for/with: Lower extremity strength, range of motion, stamina, balance, functional mobility, safety, adaptive techniques and equipment , pain mgt.    Goals are: supervision at w/c level. Therapy may not yet proceed with showering this patient. 10. SLP will assess and treat for/with:cognition.  Goals are: supervision to mod I. 11. Case Management and Social Worker will assess and treat for psychological issues and discharge planning. 12. Team conference will be held weekly to assess progress toward goals and to determine barriers to discharge. 13. Patient will receive at least 3 hours of therapy per day at least 5 days per week. 14. ELOS:  14-16 days       15. Prognosis:  excellent   I have personally performed a face to face diagnostic evaluation of this patient and formulated the key components of the plan.  Additionally, I have personally reviewed laboratory data, imaging studies, as well as relevant notes and concur with the physician assistant's documentation above.  Meredith Staggers, MD, [place], PA-C 09/15/2018

## 2018-09-16 NOTE — Progress Notes (Signed)
Patient ID: Howard Bell, male   DOB: [birth year], 53 y.o.   MRN: [id number] Plan for discharge to inpatient rehab today discharge orders are completed.

## 2018-09-16 NOTE — Progress Notes (Deleted)
[facility] Progress Note   Dialysis Orders: AF MWF   4h 51min  450/800  93kg  2/2.25 bath  Hep 9000  L AVG - parsabiv 10, iPTH 640 6/17  - Hb 8 at dc 6/10 on home O2 - Mircera 200 given 6/16 - tsat after last hosp d/c 37% - had been offParsabiv during recent hospitalization    Assessment/ Plan:   Assessment/Plan: 1. 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 Then went to CIR, then came back for AMS. Then went back to CIR. Then underwent L AKA 09/08/18 for multiple wounds not responding to Rx.  Now is bilat AKA.  2. ESRD- MWF HD. Next HD Monday - no problems Friday. 3. Hypertension/volume- BP's good which is approp for bilat AKA. Net UF 2 L 6/24 2 L: 6/26 to post wt 88.3- had BP drop towards the end of treatment Friday low Na levels suggest he will need more volume off - need to change to profile 4 at d/c 4. Anemia-hgb 7.6 trending up - some post op loss received 200 Mircera 6/16;  Resumed 200 Aranesp q Friday.. tsat 37% 6/17 - added weekly Fe  5. Metabolic bone disease- Holding parsabiv while here due to unavailability; resuming binders now as eating (phoslo) - Ca a little high without parsabiv - 6. Nutrition- chronic low alb./ prostat ordered 7. DM- per primary 8. Disp -plan for transfer to CIR today   Myriam Jacobson, PA-C [place] 09/16/2018,4:57 PM  LOS: 0 days   Pt seen, examined and agree w A/P as above.  Kelly Splinter  MD 09/16/2018, 5:00 PM     Subjective:   Still pain from AKA  Objective Vitals:   09/16/18 1526  Weight: 94.7 kg  Height: 6' (1.829 m)   Physical Exam General:NAD off o2 at present  Heart: RRR Lungs: grossly clear  Abdomen:obese soft NT Extremities: left AKA with VAC min edema right AKA staples intact no sig edema Dialysis Access:  Left AVGG + bruit   Additional Objective Labs: Basic Metabolic  Panel: Recent Labs  Lab 09/13/18 0257 09/14/18 0319 09/15/18 0721  NA 130* 133* 130*  K 4.3 3.7 4.2  CL 92* 96* 92*  CO2 25 26 25   GLUCOSE 169* 216* 157*  BUN 46* 37* 58*  CREATININE 9.22* 6.87* 10.27*  CALCIUM 8.5* 8.5* 9.1   Liver Function Tests: Recent Labs  Lab 09/10/18 0506  AST 16  ALT 7  ALKPHOS 102  BILITOT 1.1  PROT 7.1  ALBUMIN 2.2*   No results for input(s): LIPASE, AMYLASE in the last 168 hours. CBC: Recent Labs  Lab 09/11/18 0422 09/12/18 0301 09/13/18 0257 09/14/18 0319 09/15/18 0721  WBC 5.9 6.8 6.2 5.7 6.1  NEUTROABS 3.3 4.3 3.2  --   --   HGB 7.4* 8.0* 7.4* 7.3* 7.6*  HCT 24.4* 25.9* 24.3* 23.9* 24.6*  MCV 99.2 99.6 97.2 98.0 95.7  PLT 204 213 201 201 203   Blood Culture    Component Value Date/Time   SDES BLOOD LEFT ANTECUBITAL 08/15/2018 1526   SPECREQUEST  08/15/2018 1526    BOTTLES DRAWN AEROBIC ONLY Blood Culture adequate volume   CULT  08/15/2018 1526    NO GROWTH 5 DAYS Performed at [facility], [place] [address], [place],  [address]    REPTSTATUS 08/20/2018 FINAL 08/15/2018 1526    Cardiac Enzymes: Recent  Labs  Lab 09/10/18 0506  TROPONINI 0.05*   CBG: Recent Labs  Lab 09/15/18 0744 09/15/18 1628 09/15/18 2346 09/16/18 0807 09/16/18 1153  GLUCAP 139* 225* 171* 142* 226*   Iron Studies: No results for input(s): IRON, TIBC, TRANSFERRIN, FERRITIN in the last 72 hours. Lab Results  Component Value Date   INR 1.3 (H) 09/10/2018   INR 1.2 08/15/2018   Studies/Results: No results found. Medications: . [START ON 09/20/2018] ferric gluconate (FERRLECIT/NULECIT) IV     . amLODipine  10 mg Oral Daily  . brimonidine  1 drop Both Eyes BID  . calcium acetate  1,334 mg Oral TID WC  . chlorhexidine  15 mL Mouth Rinse BID  . [START ON 09/17/2018] Chlorhexidine Gluconate Cloth  6 each Topical Q0600  . [START ON 09/22/2018] darbepoetin (ARANESP) injection - DIALYSIS  200 mcg Intravenous Q Fri-HD  . docusate sodium   100 mg Oral BID  . [START ON 09/17/2018] feeding supplement (NEPRO CARB STEADY)  237 mL Oral Q24H  . feeding supplement (PRO-STAT SUGAR FREE 64)  30 mL Oral BID  . fluticasone  1 spray Each Nare Daily  . heparin  5,000 Units Subcutaneous Q8H  . insulin aspart  0-5 Units Subcutaneous QHS  . insulin aspart  0-9 Units Subcutaneous TID WC  . metoCLOPramide  5 mg Oral TID AC  . metoprolol succinate  50 mg Oral Daily  . multivitamin  1 tablet Oral QHS  . mupirocin ointment   Nasal BID  . [START ON 09/17/2018] pantoprazole  40 mg Oral Daily  . timolol  1 drop Both Eyes BID

## 2018-09-16 NOTE — Progress Notes (Signed)
Patient not ready for nighttime BiPAP and wants to go on around 1230

## 2018-09-16 NOTE — Plan of Care (Signed)
Discharge to CIR  Problem: Education: Goal: Knowledge of General Education information will improve Description: Including pain rating scale, medication(s)/side effects and non-pharmacologic comfort measures Outcome: Adequate for Discharge   Problem: Health Behavior/Discharge Planning: Goal: Ability to manage health-related needs will improve Outcome: Adequate for Discharge   Problem: Clinical Measurements: Goal: Ability to maintain clinical measurements within normal limits will improve Outcome: Adequate for Discharge Goal: Will remain free from infection Outcome: Adequate for Discharge Goal: Diagnostic test results will improve Outcome: Adequate for Discharge Goal: Respiratory complications will improve Outcome: Adequate for Discharge Goal: Cardiovascular complication will be avoided Outcome: Adequate for Discharge   Problem: Activity: Goal: Risk for activity intolerance will decrease Outcome: Adequate for Discharge   Problem: Nutrition: Goal: Adequate nutrition will be maintained Outcome: Adequate for Discharge   Problem: Coping: Goal: Level of anxiety will decrease Outcome: Adequate for Discharge   Problem: Elimination: Goal: Will not experience complications related to bowel motility Outcome: Adequate for Discharge   Problem: Pain Managment: Goal: General experience of comfort will improve Outcome: Adequate for Discharge   Problem: Safety: Goal: Ability to remain free from injury will improve Outcome: Adequate for Discharge   Problem: Skin Integrity: Goal: Risk for impaired skin integrity will decrease Outcome: Adequate for Discharge

## 2018-09-16 NOTE — Progress Notes (Signed)
Patient arrived from 7W, no s/s of distress. No complications noted at this time. Will continue plan of care. Audie Clear, LPN

## 2018-09-16 NOTE — Progress Notes (Signed)
PROGRESS NOTE  Howard Bell VHQ:[id number] DOB: 1966-01-08 DOA: 09/08/2018 PCP: Horald Pollen, MD   LOS: 8 days   Brief Narrative / Interim history: 53 year old male with history of OSA, chronic hypoxic respiratory failure on 2 L at home, end-stage renal disease on HD MWF, PAD status post right BKA in February 2020, hypertension, type 2 diabetes mellitus, anemia, diabetic gastroparesis, depression, was admitted on Dr. Sharol Given service on 6/19 for elective left AKA after failing medical management.  He was taken to the OR, however.  He was unable to wake up and was hypoxic requiring intermittent vasopressor support and maintenance of the ETT.  He was transferred to the ICU, and then successfully extubated on 6/20, off the vasopressors and pressors drip and TRH assume care as consultants.  Subjective: No complaints, no shortness of breath, no chest pain.  He is wondering when he will be transferred to rehab  Assessment & Plan: Active Problems:   Status post bilateral above knee amputation (HCC)   Acute respiratory failure with hypercapnia (HCC)   Pressure injury of skin   Principal Problem Bilateral AKA -With most recent left AKA on 6/19, today postop day 5 -Patient insistent that he wants to go home on discharge but agreeable to CIR if they offer a bed -Management per primary, OK from medical standpoint to CIR discharge  Active Problems Acute on chronic hypoxic and hypercapnic respiratory failure, baseline 2 L oxygen during the daytime and 3 L at night, OSA, obesity hypoventilation syndrome -Currently respiratory status is back to baseline on 2 L nasal cannula. -Reports that BiPAP has been misplaced since he left rehab, family advised by the case manager to call the rehab  Acute metabolic encephalopathy -Postop, resolved, alert and oriented x3, off the Precedex drip  HTN -Normal 2D echo on 6/16 with EF of 60-65% -had mild troponin elevation due to demand ischemia -Still  hypertensive this morning but scheduled to get dialysis today.  We will monitor blood pressure post dialysis -Continue amlodipine, metoprolol, BP stable  History of PAD/PVD -Stable, post bilateral AKA  ESRD on hemodialysis, MWF -Underwent dialysis yesterday  History of gastroparesis -continue Reglan    Scheduled Meds: . amLODipine  10 mg Oral Daily  . brimonidine  1 drop Both Eyes BID  . calcium acetate  1,334 mg Oral TID WC  . chlorhexidine  15 mL Mouth Rinse BID  . chlorhexidine gluconate (MEDLINE KIT)  15 mL Mouth Rinse BID  . Chlorhexidine Gluconate Cloth  6 each Topical Q0600  . darbepoetin (ARANESP) injection - DIALYSIS  200 mcg Intravenous Q Fri-HD  . docusate sodium  100 mg Oral BID  . feeding supplement (NEPRO CARB STEADY)  237 mL Oral Q24H  . feeding supplement (PRO-STAT SUGAR FREE 64)  30 mL Oral BID  . fluticasone  1 spray Each Nare Daily  . heparin injection (subcutaneous)  5,000 Units Subcutaneous Q8H  . insulin aspart  0-5 Units Subcutaneous QHS  . insulin aspart  0-9 Units Subcutaneous TID WC  . mouth rinse  15 mL Mouth Rinse q12n4p  . metoCLOPramide  5 mg Oral TID AC  . metoprolol succinate  50 mg Oral Daily  . multivitamin  1 tablet Oral QHS  . mupirocin ointment   Nasal BID  . pantoprazole  40 mg Oral Q0600  . timolol  1 drop Both Eyes BID   Continuous Infusions: . sodium chloride Stopped (09/10/18 1325)  . sodium chloride    . sodium chloride    .  sodium chloride    . sodium chloride    . ferric gluconate (FERRLECIT/NULECIT) IV 105 mL/hr at 09/13/18 1300  . [START ON 09/20/2018] ferric gluconate (FERRLECIT/NULECIT) IV     PRN Meds:.sodium chloride, sodium chloride, sodium chloride, sodium chloride, sodium chloride, albuterol, alteplase, bisacodyl, heparin, heparin, hydrALAZINE, HYDROmorphone (DILAUDID) injection, lidocaine (PF), lidocaine-prilocaine, menthol-cetylpyridinium, ondansetron **OR** ondansetron (ZOFRAN) IV, oxyCODONE,  pentafluoroprop-tetrafluoroeth  DVT prophylaxis: heparin Code Status: Full code Family Communication: d/w patient  Disposition Plan: CIR when bed available  Procedures:   2D echo:  IMPRESSIONS    1. The left ventricle has normal systolic function with an ejection fraction of 60-65%. The cavity size was normal. There is mildly increased left ventricular wall thickness. Left ventricular diastolic Doppler parameters are consistent with impaired  relaxation. There is right ventricular volume and pressure overload. No evidence of left ventricular regional wall motion abnormalities.  2. The right ventricle has normal systolic function. The cavity was normal. There is no increase in right ventricular wall thickness. Right ventricular systolic pressure is moderately elevated with an estimated pressure of 52.3 mmHg.  3. There is moderate mitral annular calcification present.  4. The aortic valve is tricuspid. Mild thickening of the aortic valve. Moderate calcification of the aortic valve.    Left AKA, wound vac  Antimicrobials:  None    Objective: Vitals:   09/15/18 2347 09/16/18 0100 09/16/18 0703 09/16/18 0842  BP: 135/75  (!) 167/82 (!) 172/91  Pulse: 88 89 85 90  Resp: _0 Temp: 98.3 F (36.8 C)  98.4 F (36.9 C) 99.1 F (37.3 C)  TempSrc: Oral  Oral Oral  SpO2: 96% 96% (!) 87% 93%  Weight:      Height:        Intake/Output Summary (Last 24 hours) at 09/16/2018 1105 Last data filed at 09/16/2018 7290 Gross per 24 hour  Intake 480 ml  Output 2000 ml  Net -1520 ml   Filed Weights   09/13/18 1120 09/15/18 1030 09/15/18 1336  Weight: 87.6 kg 91.5 kg 88.3 kg    Examination:  Constitutional: No distress Respiratory: No wheezing or crackles Cardiovascular: Regular rate and rhythm   Data Reviewed: I have independently reviewed following labs and imaging studies   CBC: Recent Labs  Lab 09/10/18 0506 09/11/18 0422 09/12/18 0301 09/13/18 0257 09/14/18  0319 09/15/18 0721  WBC 6.0 5.9 6.8 6.2 5.7 6.1  NEUTROABS 3.9 3.3 4.3 3.2  --   --   HGB 7.4* 7.4* 8.0* 7.4* 7.3* 7.6*  HCT 24.7* 24.4* 25.9* 24.3* 23.9* 24.6*  MCV 97.2 99.2 99.6 97.2 98.0 95.7  PLT 219 204 213 201 201 211   Basic Metabolic Panel: Recent Labs  Lab 09/11/18 0422 09/12/18 0301 09/13/18 0257 09/14/18 0319 09/15/18 0721  NA 133* 133* 130* 133* 130*  K 3.9 3.8 4.3 3.7 4.2  CL 96* 96* 92* 96* 92*  CO2 _1 GLUCOSE 181* 121* 169* 216* 157*  BUN 48* 30* 46* 37* 58*  CREATININE 10.67* 7.28* 9.22* 6.87* 10.27*  CALCIUM 8.2* 8.5* 8.5* 8.5* 9.1   GFR: Estimated Creatinine Clearance: 9.3 mL/min (A) (by C-G formula based on SCr of 10.27 mg/dL (H)). Liver Function Tests: Recent Labs  Lab 09/10/18 0506  AST 16  ALT 7  ALKPHOS 102  BILITOT 1.1  PROT 7.1  ALBUMIN 2.2*   No results for input(s): LIPASE, AMYLASE in the last 168 hours. No results for input(s): AMMONIA in the last 168  hours. Coagulation Profile: Recent Labs  Lab 09/10/18 0506  INR 1.3*   Cardiac Enzymes: Recent Labs  Lab 09/10/18 0506  TROPONINI 0.05*   BNP (last 3 results) No results for input(s): PROBNP in the last 8760 hours. HbA1C: No results for input(s): HGBA1C in the last 72 hours. CBG: Recent Labs  Lab 09/14/18 2128 09/15/18 0744 09/15/18 1628 09/15/18 2346 09/16/18 0807  GLUCAP 158* 139* 225* 171* 142*   Lipid Profile: No results for input(s): CHOL, HDL, LDLCALC, TRIG, CHOLHDL, LDLDIRECT in the last 72 hours. Thyroid Function Tests: No results for input(s): TSH, T4TOTAL, FREET4, T3FREE, THYROIDAB in the last 72 hours. Anemia Panel: No results for input(s): VITAMINB12, FOLATE, FERRITIN, TIBC, IRON, RETICCTPCT in the last 72 hours. Urine analysis:    Component Value Date/Time   COLORURINE RED (A) 11/19/2016 0020   APPEARANCEUR TURBID (A) 11/19/2016 0020   LABSPEC  11/19/2016 0020    TEST NOT REPORTED DUE TO COLOR INTERFERENCE OF URINE PIGMENT   PHURINE   11/19/2016 0020    TEST NOT REPORTED DUE TO COLOR INTERFERENCE OF URINE PIGMENT   GLUCOSEU (A) 11/19/2016 0020    TEST NOT REPORTED DUE TO COLOR INTERFERENCE OF URINE PIGMENT   HGBUR (A) 11/19/2016 0020    TEST NOT REPORTED DUE TO COLOR INTERFERENCE OF URINE PIGMENT   BILIRUBINUR (A) 11/19/2016 0020    TEST NOT REPORTED DUE TO COLOR INTERFERENCE OF URINE PIGMENT   KETONESUR (A) 11/19/2016 0020    TEST NOT REPORTED DUE TO COLOR INTERFERENCE OF URINE PIGMENT   PROTEINUR (A) 11/19/2016 0020    TEST NOT REPORTED DUE TO COLOR INTERFERENCE OF URINE PIGMENT   NITRITE (A) 11/19/2016 0020    TEST NOT REPORTED DUE TO COLOR INTERFERENCE OF URINE PIGMENT   LEUKOCYTESUR (A) 11/19/2016 0020    TEST NOT REPORTED DUE TO COLOR INTERFERENCE OF URINE PIGMENT   Sepsis Labs: Invalid input(s): PROCALCITONIN, LACTICIDVEN  Recent Results (from the past 240 hour(s))  SARS Coronavirus 2 (CEPHEID - Performed in [place] hospital lab), Hosp Order     Status: None   Collection Time: 09/08/18  8:15 AM   Specimen: Nasopharyngeal Swab  Result Value Ref Range Status   SARS Coronavirus 2 NEGATIVE NEGATIVE Final    Comment: (NOTE) If result is NEGATIVE SARS-CoV-2 target nucleic acids are NOT DETECTED. The SARS-CoV-2 RNA is generally detectable in upper and lower  respiratory specimens during the acute phase of infection. The lowest  concentration of SARS-CoV-2 viral copies this assay can detect is 250  copies / mL. A negative result does not preclude SARS-CoV-2 infection  and should not be used as the sole basis for treatment or other  patient management decisions.  A negative result may occur with  improper specimen collection / handling, submission of specimen other  than nasopharyngeal swab, presence of viral mutation(s) within the  areas targeted by this assay, and inadequate number of viral copies  (<250 copies / mL). A negative result must be combined with clinical  observations, patient history, and  epidemiological information. If result is POSITIVE SARS-CoV-2 target nucleic acids are DETECTED. The SARS-CoV-2 RNA is generally detectable in upper and lower  respiratory specimens dur ing the acute phase of infection.  Positive  results are indicative of active infection with SARS-CoV-2.  Clinical  correlation with patient history and other diagnostic information is  necessary to determine patient infection status.  Positive results do  not rule out bacterial infection or co-infection with other viruses. If result  is PRESUMPTIVE POSTIVE SARS-CoV-2 nucleic acids MAY BE PRESENT.   A presumptive positive result was obtained on the submitted specimen  and confirmed on repeat testing.  While 2019 novel coronavirus  (SARS-CoV-2) nucleic acids may be present in the submitted sample  additional confirmatory testing may be necessary for epidemiological  and / or clinical management purposes  to differentiate between  SARS-CoV-2 and other Sarbecovirus currently known to infect humans.  If clinically indicated additional testing with an alternate test  methodology [phone]) is advised. The SARS-CoV-2 RNA is generally  detectable in upper and lower respiratory sp ecimens during the acute  phase of infection. The expected result is Negative. Fact Sheet for Patients:  StrictlyIdeas.no Fact Sheet for Healthcare Providers: BankingDealers.co.za This test is not yet approved or cleared by the [place] FDA and has been authorized for detection and/or diagnosis of SARS-CoV-2 by FDA under an Emergency Use Authorization (EUA).  This EUA will remain in effect (meaning this test can be used) for the duration of the COVID-19 declaration under Section 564(b)(1) of the Act, 21 U.S.C. section 360bbb-3(b)(1), unless the authorization is terminated or revoked sooner. Performed at [facility], [place] [address] [place], [place] [address]   MRSA PCR  Screening     Status: Abnormal   Collection Time: 09/08/18  5:01 PM   Specimen: Nasopharyngeal  Result Value Ref Range Status   MRSA by PCR POSITIVE (A) NEGATIVE Final    Comment:        The GeneXpert MRSA Assay (FDA approved for NASAL specimens only), is one component of a comprehensive MRSA colonization surveillance program. It is not intended to diagnose MRSA infection nor to guide or monitor treatment for MRSA infections. RESULT CALLED TO, READ BACK BY AND VERIFIED WITHGeradine Girt RN 09/08/18 1932 JDW Performed at [facility], [address] [place], [place] [address]       Radiology Studies: No results found.   Marzetta Board, MD, PhD [facility]  Contact via  [email or web address]  [place] P: [phone]  F: [phone]

## 2018-09-16 NOTE — Progress Notes (Signed)
[facility] Progress Note   Dialysis Orders: AF MWF   4h 57min  450/800  93kg  2/2.25 bath  Hep 9000  L AVG - parsabiv 10, iPTH 640 6/17  - Hb 8 at dc 6/10 on home O2 - Mircera 200 given 6/16 - tsat after last hosp d/c 37% - had been offParsabiv during recent hospitalization    Assessment/ Plan:   Assessment/Plan: 1. 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 Then went to CIR, then came back for AMS. Then went back to CIR. Then underwent L AKA 09/08/18 for multiple wounds not responding to Rx.  Now is bilat AKA.  2. ESRD- MWF HD. Next HD Monday - no problems Friday. 3. Hypertension/volume- BP's good which is approp for bilat AKA. Net UF 2 L 6/24 2 L: 6/26 to post wt 88.3- had BP drop towards the end of treatment Friday low Na levels suggest he will need more volume off - need to change to profile 4 at d/c 4. Anemia-hgb 7.6 trending up - some post op loss received 200 Mircera 6/16;  Resumed 200 Aranesp q Friday.. tsat 37% 6/17 - added weekly Fe  5. Metabolic bone disease- Holding parsabiv while here due to unavailability; resuming binders now as eating (phoslo) - Ca a little high without parsabiv - 6. Nutrition- chronic low alb./ prostat ordered 7. DM- per primary 8. Disp -plan for transfer to CIR today   Myriam Jacobson, PA-C [place] 09/16/2018,5:01 PM  LOS: 0 days   Pt seen, examined and agree w A/P as above.  Kelly Splinter  MD 09/16/2018, 5:01 PM     Subjective:   Still pain from AKA  Objective Vitals:   09/16/18 1526  Weight: 94.7 kg  Height: 6' (1.829 m)   Physical Exam General:NAD off o2 at present  Heart: RRR Lungs: grossly clear  Abdomen:obese soft NT Extremities: left AKA with VAC min edema right AKA staples intact no sig edema Dialysis Access:  Left AVGG + bruit   Additional Objective Labs: Basic Metabolic  Panel: Recent Labs  Lab 09/13/18 0257 09/14/18 0319 09/15/18 0721  NA 130* 133* 130*  K 4.3 3.7 4.2  CL 92* 96* 92*  CO2 25 26 25   GLUCOSE 169* 216* 157*  BUN 46* 37* 58*  CREATININE 9.22* 6.87* 10.27*  CALCIUM 8.5* 8.5* 9.1   Liver Function Tests: Recent Labs  Lab 09/10/18 0506  AST 16  ALT 7  ALKPHOS 102  BILITOT 1.1  PROT 7.1  ALBUMIN 2.2*   No results for input(s): LIPASE, AMYLASE in the last 168 hours. CBC: Recent Labs  Lab 09/11/18 0422 09/12/18 0301 09/13/18 0257 09/14/18 0319 09/15/18 0721  WBC 5.9 6.8 6.2 5.7 6.1  NEUTROABS 3.3 4.3 3.2  --   --   HGB 7.4* 8.0* 7.4* 7.3* 7.6*  HCT 24.4* 25.9* 24.3* 23.9* 24.6*  MCV 99.2 99.6 97.2 98.0 95.7  PLT 204 213 201 201 203   Blood Culture    Component Value Date/Time   SDES BLOOD LEFT ANTECUBITAL 08/15/2018 1526   SPECREQUEST  08/15/2018 1526    BOTTLES DRAWN AEROBIC ONLY Blood Culture adequate volume   CULT  08/15/2018 1526    NO GROWTH 5 DAYS Performed at [facility], [place] [address], [place], [place] [id number]    REPTSTATUS 08/20/2018 FINAL 08/15/2018 1526    Cardiac Enzymes: Recent  Labs  Lab 09/10/18 0506  TROPONINI 0.05*   CBG: Recent Labs  Lab 09/15/18 0744 09/15/18 1628 09/15/18 2346 09/16/18 0807 09/16/18 1153  GLUCAP 139* 225* 171* 142* 226*   Iron Studies: No results for input(s): IRON, TIBC, TRANSFERRIN, FERRITIN in the last 72 hours. Lab Results  Component Value Date   INR 1.3 (H) 09/10/2018   INR 1.2 08/15/2018   Studies/Results: No results found. Medications: . [START ON 09/20/2018] ferric gluconate (FERRLECIT/NULECIT) IV     . amLODipine  10 mg Oral Daily  . brimonidine  1 drop Both Eyes BID  . calcium acetate  1,334 mg Oral TID WC  . chlorhexidine  15 mL Mouth Rinse BID  . [START ON 09/17/2018] Chlorhexidine Gluconate Cloth  6 each Topical Q0600  . [START ON 09/22/2018] darbepoetin (ARANESP) injection - DIALYSIS  200 mcg Intravenous Q Fri-HD  . docusate sodium   100 mg Oral BID  . [START ON 09/17/2018] feeding supplement (NEPRO CARB STEADY)  237 mL Oral Q24H  . feeding supplement (PRO-STAT SUGAR FREE 64)  30 mL Oral BID  . fluticasone  1 spray Each Nare Daily  . heparin  5,000 Units Subcutaneous Q8H  . insulin aspart  0-5 Units Subcutaneous QHS  . insulin aspart  0-9 Units Subcutaneous TID WC  . metoCLOPramide  5 mg Oral TID AC  . metoprolol succinate  50 mg Oral Daily  . multivitamin  1 tablet Oral QHS  . mupirocin ointment   Nasal BID  . [START ON 09/17/2018] pantoprazole  40 mg Oral Daily  . timolol  1 drop Both Eyes BID

## 2018-09-16 NOTE — Discharge Summary (Signed)
Discharge Diagnoses:  Active Problems:   Status post bilateral above knee amputation ([place])   Acute respiratory failure with hypercapnia (HCC)   Pressure injury of skin   Surgeries: Procedure(s): LEFT ABOVE KNEE AMPUTATION Application Of Wound Vac on 09/08/2018    Consultants: Treatment Team:  Jaynie Collins, MD Roney Jaffe, MD  Discharged Condition: Improved  Hospital Course: Howard Bell is an 53 y.o. male who was admitted 09/08/2018 with a chief complaint of gangrene left leg, with a final diagnosis of Gangrene Left Leg.  Patient was brought to the operating room on 09/08/2018 and underwent Procedure(s): LEFT ABOVE KNEE AMPUTATION Application Of Wound Vac.    Patient was given perioperative antibiotics:  Anti-infectives (From admission, onward)   Start     Dose/Rate Route Frequency Ordered Stop   09/08/18 0845  ceFAZolin (ANCEF) IVPB 2g/100 mL premix     2 g 200 mL/hr over 30 Minutes Intravenous On call to O.R. 09/08/18 0831 09/08/18 1151    .  Patient was given sequential compression devices, early ambulation, and aspirin for DVT prophylaxis.  Recent vital signs:  Patient Vitals for the past 24 hrs:  BP Temp Temp src Pulse Resp SpO2 Weight  09/16/18 0703 (!) 167/82 98.4 F (36.9 C) Oral 85 16 (!) 87 % -  09/16/18 0100 - - - 89 20 96 % -  09/15/18 2347 135/75 98.3 F (36.8 C) Oral 88 18 96 % -  09/15/18 1420 (!) 151/54 - - 93 - 100 % -  09/15/18 1336 (!) 130/47 98.9 F (37.2 C) Oral 89 13 100 % 88.3 kg  09/15/18 1330 (!) 98/52 - - 89 - - -  09/15/18 1300 108/85 - - 74 - - -  09/15/18 1230 (!) 141/57 - - 83 - - -  09/15/18 1200 (!) 176/32 - - 87 - - -  09/15/18 1130 (!) 143/46 - - 86 - - -  09/15/18 1100 (!) 162/70 - - 86 - - -  09/15/18 1036 (!) 172/78 - - 84 - - -  09/15/18 1030 (!) 180/70 98.5 F (36.9 C) Oral 84 18 97 % 91.5 kg  09/15/18 0900 (!) 136/54 99.1 F (37.3 C) Oral 86 16 94 % -  .  Recent laboratory studies: No  results found.  Discharge Medications:   Allergies as of 09/16/2018   No Known Allergies     Medication List    STOP taking these medications   acetaminophen 325 MG tablet Commonly known as: TYLENOL   oxyCODONE 5 MG immediate release tablet Commonly known as: Oxy IR/ROXICODONE     TAKE these medications   Admelog SoloStar 100 UNIT/ML KwikPen Generic drug: insulin lispro Inject 1-9 Units into the skin 3 (three) times daily. If bs is 121-150=1 unit, 151-200=2 units, 201-250=3 units, 251-300=5 units, 301-350=7 units, 351-400=9 units   albuterol 108 (90 Base) MCG/ACT inhaler Commonly known as: VENTOLIN HFA Inhale 1 puff into the lungs every 4 (four) hours as needed for wheezing or shortness of breath.   brimonidine 0.2 % ophthalmic solution Commonly known as: ALPHAGAN Place 1 drop into both eyes 2 (two) times daily.   calcium acetate 667 MG capsule Commonly known as: PHOSLO Take 2 capsules (1,334 mg total) by mouth 3 (three) times daily with meals.   docusate sodium 100 MG capsule Commonly known as: COLACE Take 1 capsule (100 mg total) by mouth 2 (two) times daily.   fluticasone 50 MCG/ACT nasal spray Commonly known as: Kathryn  1 spray into both nostrils daily.   latanoprost 0.005 % ophthalmic solution Commonly known as: XALATAN Place 1 drop into both eyes at bedtime.   methocarbamol 500 MG tablet Commonly known as: ROBAXIN Take 0.5 tablets (250 mg total) by mouth 2 (two) times daily as needed for muscle spasms. What changed: how much to take   metoCLOPramide 5 MG tablet Commonly known as: Reglan Take 1 tablet (5 mg total) by mouth 3 (three) times daily before meals.   metoprolol succinate 50 MG 24 hr tablet Commonly known as: TOPROL-XL Take 1 tablet (50 mg total) by mouth at bedtime. Take with a meal.   omeprazole 20 MG capsule Commonly known as: PRILOSEC Take 1 capsule (20 mg total) by mouth at bedtime.   sevelamer carbonate 800 MG tablet Commonly  known as: RENVELA Take 1 tablet (800 mg total) by mouth 3 (three) times daily with meals.   silver sulfADIAZINE 1 % cream Commonly known as: SILVADENE Apply topically daily.   timolol 0.5 % ophthalmic solution Commonly known as: BETIMOL Place 1 drop into both eyes 2 (two) times daily.       Diagnostic Studies: Dg Chest 2 View  Result Date: 08/23/2018 CLINICAL DATA:  Hypoxia EXAM: CHEST - 2 VIEW COMPARISON:  08/15/2018 FINDINGS: Cardiac shadow is mildly prominent. Lungs are well aerated bilaterally with mild bibasilar chronic scarring stable from the previous exam. Vascular stents on the left are again seen and stable. No new bony abnormality is noted. IMPRESSION: Bibasilar scarring.  No acute abnormality noted. Electronically Signed   By: Inez Catalina M.D.   On: 08/23/2018 12:18   Portable Chest Xray  Result Date: 09/09/2018 CLINICAL DATA:  Intubation. EXAM: PORTABLE CHEST 1 VIEW COMPARISON:  09/08/2018 FINDINGS: Endotracheal tube terminates 4.1 cm from the carina. Enteric catheter within the expected location of the stomach. Cardiomediastinal silhouette is normal. Mediastinal contours appear intact. There is no evidence of pneumothorax. Bilateral basilar peribronchial infiltrates have slightly improved. Osseous structures are without acute abnormality. Soft tissues are grossly normal. IMPRESSION: 1. Support apparatus as described. 2. Slightly improved bilateral basilar peribronchial infiltrates. Electronically Signed   By: Fidela Salisbury M.D.   On: 09/09/2018 14:31   Dg Chest Port 1 View  Result Date: 09/08/2018 CLINICAL DATA:  Endotracheal tube placement. Acute respiratory failure. EXAM: PORTABLE CHEST 1 VIEW COMPARISON:  08/23/2018 and 08/15/2018 and 05/12/2018 FINDINGS: Endotracheal tube is in the proximal right mainstem bronchus and needs to be retracted approximately 2.5 cm. NG tube tip is in the fundus of the stomach with the side hole at the GE junction. There is bibasilar  atelectasis or hazy infiltrate which appears minimally progressed since the prior study of 08/23/2018. This is probably accentuated by the shallow inspiration. Vascular stent in the left subclavian vein. No acute bone abnormality. IMPRESSION: ETT in the right mainstem bronchus. Bibasilar infiltrates/atelectasis, unchanged. Critical Value/emergent results were called by telephone at the time of interpretation on 09/08/2018 at 3:43 pm to Georges Lynch, RN , who verbally acknowledged these results. Electronically Signed   By: Lorriane Shire M.D.   On: 09/08/2018 15:46    Patient benefited maximally from their hospital stay and there were no complications.     Disposition: Discharge disposition: 02-Transferred to [facility]      Discharge Instructions    Call MD / Call 911   Complete by: As directed    If you experience chest pain or shortness of breath, CALL 911 and be transported to the hospital emergency  room.  If you develope a fever above 101 F, pus (white drainage) or increased drainage or redness at the wound, or calf pain, call your surgeon's office.   Constipation Prevention   Complete by: As directed    Drink plenty of fluids.  Prune juice may be helpful.  You may use a stool softener, such as Colace (over the counter) 100 mg twice a day.  Use MiraLax (over the counter) for constipation as needed.   Diet - low sodium heart healthy   Complete by: As directed    Increase activity slowly as tolerated   Complete by: As directed      Follow-up Information    Newt Minion, MD In 1 week.   Specialty: Orthopedic Surgery Contact information: [address] [place] [address] [phone]            Signed: Newt Minion 09/16/2018, 7:53 AM

## 2018-09-17 ENCOUNTER — Inpatient Hospital Stay (HOSPITAL_COMMUNITY): Payer: Medicare Other

## 2018-09-17 DIAGNOSIS — N186 End stage renal disease: Secondary | ICD-10-CM

## 2018-09-17 DIAGNOSIS — Z992 Dependence on renal dialysis: Secondary | ICD-10-CM

## 2018-09-17 LAB — COMPREHENSIVE METABOLIC PANEL
ALT: 6 U/L (ref 0–44)
AST: 13 U/L — ABNORMAL LOW (ref 15–41)
Albumin: 2.4 g/dL — ABNORMAL LOW (ref 3.5–5.0)
Alkaline Phosphatase: 99 U/L (ref 38–126)
Anion gap: 12 (ref 5–15)
BUN: 52 mg/dL — ABNORMAL HIGH (ref 6–20)
CO2: 28 mmol/L (ref 22–32)
Calcium: 9.2 mg/dL (ref 8.9–10.3)
Chloride: 93 mmol/L — ABNORMAL LOW (ref 98–111)
Creatinine, Ser: 10.34 mg/dL — ABNORMAL HIGH (ref 0.61–1.24)
GFR calc Af Amer: 6 mL/min — ABNORMAL LOW (ref >60)
GFR calc non Af Amer: 5 mL/min — ABNORMAL LOW (ref >60)
Glucose, Bld: 183 mg/dL — ABNORMAL HIGH (ref 70–99)
Potassium: 3.8 mmol/L (ref 3.5–5.1)
Sodium: 133 mmol/L — ABNORMAL LOW (ref 135–145)
Total Bilirubin: 0.5 mg/dL (ref 0.3–1.2)
Total Protein: 7.4 g/dL (ref 6.5–8.1)

## 2018-09-17 MED ORDER — CHLORHEXIDINE GLUCONATE CLOTH 2 % EX PADS: 6.0000 | MEDICATED_PAD | Freq: Every day | CUTANEOUS | Status: DC

## 2018-09-17 MED ORDER — INSULIN GLARGINE 100 UNIT/ML ~~LOC~~ SOLN
5.0000 [IU] | Freq: Every day | SUBCUTANEOUS | Status: DC
  Administered 2018-09-17 – 2018-09-18 (×2): 5 [IU] via SUBCUTANEOUS
  Filled 2018-09-17 (×2): qty 0.05

## 2018-09-17 MED ORDER — GABAPENTIN 100 MG PO CAPS
100.0000 mg | ORAL_CAPSULE | Freq: Every day | ORAL | Status: DC
  Administered 2018-09-17 – 2018-09-28 (×12): 100 mg via ORAL
  Filled 2018-09-17 (×12): qty 1

## 2018-09-17 NOTE — Evaluation (Signed)
Occupational Therapy Assessment and Plan  Patient Details  Name: Howard Bell MRN: [id number] Date of Birth: 1966-03-18  OT Diagnosis: acute pain, muscle weakness (generalized), swelling of limb and R and L AKA Rehab Potential: Rehab Potential (ACUTE ONLY): Fair ELOS: 2-3 weeks   Today's Date: 09/17/2018 OT Individual Time: 1045-1200 OT Individual Time Calculation (min): 75 min     Problem List:  Patient Active Problem List   Diagnosis Date Noted  . Pressure injury of skin 09/16/2018  . Status post bilateral above knee amputation ([place]) 09/08/2018  . Acute respiratory failure with hypercapnia ([place])   . Atherosclerosis of native arteries of extremities with gangrene, left leg ([place]) 09/05/2018  . Hypoxia   . ESRD (end stage renal disease) ([place])   . Labile blood glucose   . Postoperative pain   . Dry gangrene (HCC)--left foot/left shin   . SIRS (systemic inflammatory response syndrome) (HCC)   . Poorly controlled type 2 diabetes mellitus with peripheral neuropathy ([place])   . Peripheral arterial disease ([place])   . Unilateral AKA, right ([place]) 08/17/2018  . Sepsis ([place]) 08/15/2018  . Acute metabolic encephalopathy 78/24/2353  . Gangrene of left foot ([place])   . Hypoglycemia   . Drug induced constipation   . Confusion, postoperative   . Labile blood pressure   . Uncontrolled diabetes mellitus type 2 with peripheral artery disease ([place])   . Anemia of chronic disease   . Diabetes mellitus type 2 in nonobese (HCC)   . Supplemental oxygen dependent   . Unilateral AKA, left ([place]) 08/07/2018  . S/P BKA (below knee amputation), right ([place]) 08/04/2018  . Severe protein-calorie malnutrition ([place]) 08/01/2018  . Dehiscence of amputation stump ([place]) 08/01/2018  . Cutaneous abscess of left foot 08/01/2018  . Cellulitis of right lower extremity   . Influenza A 05/12/2018  . Pneumonia 05/12/2018  . Gangrene of right foot ([place])   . Critical limb ischemia with history of revascularization of  same extremity 05/09/2018  . LUQ pain   . 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 ([place]) 11/16/2017  . CKD stage 5 due to type 2 diabetes mellitus ([place]) 11/16/2017  . Glaucoma due to type 2 diabetes mellitus ([place]) 11/16/2017  . Chronic generalized pain 11/16/2017  . Recurrent pneumonia 11/06/2017  . Diabetic gastroparesis ([place]) 11/06/2017  . Generalized abdominal pain 09/13/2017  . Acute pain of right shoulder 07/26/2017  . Acute bursitis of right shoulder 07/26/2017  . Left arm swelling 03/28/2017  . ESRD on dialysis ([place]) 03/28/2017  . Chest pain 11/26/2016  . Essential hypertension 11/26/2016  . Anemia due to end stage renal disease ([place]) 08/04/2016  . Acute respiratory failure with hypoxemia ([place]) 08/04/2016  . Type 2 diabetes mellitus with foot ulcer ([place]) 08/04/2016  . Acute CHF (congestive heart failure) ([place]) 08/04/2016  . Elevated troponin     Past Medical History:  Past Medical History:  Diagnosis Date  . Arthritis   . Congestive heart failure (CHF) ([place])   . Depression   . 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 Diabetic gastroparesis ([place]) 11/06/2017  . ESRD (end stage renal disease) on dialysis [facility])    M,W.F dialysis  . H/O seasonal allergies   . Hypertension   . Pneumonia   . Sleep apnea    not using it now, on continuous O2 2 L during the  day, 3 L during the night   Past Surgical History:  Past Surgical History:  Procedure Laterality Date  . AMPUTATION Right 05/14/2018   Procedure: AMPUTATION BELOW KNEE;  Surgeon: Newt Minion, MD;  Location: [place];  Service: Orthopedics;  Laterality: Right;  . AMPUTATION Right 08/04/2018   Procedure: RIGHT ABOVE KNEE AMPUTATION;  Surgeon: Newt Minion, MD;  Location: [place];  Service: Orthopedics;  Laterality: Right;  . AMPUTATION Left 09/08/2018   Procedure:  LEFT ABOVE KNEE AMPUTATION;  Surgeon: Newt Minion, MD;  Location: [place];  Service: Orthopedics;  Laterality: Left;  . APPLICATION OF WOUND VAC Left 09/08/2018   Procedure: Application Of Wound Vac;  Surgeon: Newt Minion, MD;  Location: [place];  Service: Orthopedics;  Laterality: Left;  . ESOPHAGOGASTRODUODENOSCOPY (EGD) WITH PROPOFOL N/A 12/05/2017   Procedure: ESOPHAGOGASTRODUODENOSCOPY (EGD) WITH PROPOFOL;  Surgeon: Doran Stabler, MD;  Location: [facility];  Service: Gastroenterology;  Laterality: N/A;  . HERNIA REPAIR    . IR AV DIALY SHUNT INTRO NEEDLE/INTRACATH INITIAL W/PTA/IMG LEFT  03/30/2017    Assessment & Plan Clinical Impression: Howard Bell is a 53 year old male with history of T2DM with neuropathy and retinopathy, ESRD- HD ion MWF, OSA with chronic hypoxic respiratory failure--oxygen dependent, PVD with gangrenous changes BLE s/p R-AKA with recent CIR stay. He was admitted on 09/08/18 for L-AKA due to progressive gangrenous changes. Post op with acute on chronic respiratory failure and hypotension requiring pressors. He was extubated to BIPAP on 6/20 and mentation slowly improving. He has been weaned off BIPAP and acute on chronic anemia being monitored. Therapy evaluations completed revealing decline in functional status and CIR recommended for follow up therapy.  Patient transferred to CIR on 09/16/2018 .    Patient currently requires max with basic self-care skills secondary to muscle weakness, decreased cardiorespiratoy endurance, decreased awareness, decreased problem solving, decreased safety awareness and delayed processing and decreased sitting balance, decreased standing balance, decreased postural control and decreased balance strategies.  Prior to hospitalization, patient could complete ADLs with modified independent .  Patient will benefit from skilled intervention to decrease level of assist with basic self-care skills and increase level of independence with  iADL prior to discharge home with care partner.  Anticipate patient will require 24 hour supervision and follow up home health.  OT - End of Session Activity Tolerance: Tolerates 10 - 20 min activity with multiple rests Endurance Deficit: Yes Endurance Deficit Description: Generalized weakness OT Assessment Rehab Potential (ACUTE ONLY): Fair OT Patient demonstrates impairments in the following area(s): Balance;Skin Integrity;Cognition;Endurance;Motor;Pain;Perception;Safety;Sensory OT Basic ADL's Functional Problem(s): Bathing;Dressing;Toileting OT Transfers Functional Problem(s): Toilet OT Additional Impairment(s): None OT Plan OT Intensity: Minimum of 1-2 x/day, 45 to 90 minutes OT Frequency: 5 out of 7 days OT Duration/Estimated Length of Stay: 2-3 weeks OT Treatment/Interventions: Balance/vestibular training;Neuromuscular re-education;Self Care/advanced ADL retraining;Therapeutic Exercise;Wheelchair propulsion/positioning;DME/adaptive equipment instruction;Pain management;Skin care/wound managment;UE/LE Strength taining/ROM;Patient/family education;UE/LE Coordination activities;Discharge planning;Functional mobility training;Therapeutic Activities;Cognitive remediation/compensation;Disease mangement/prevention;Community reintegration;Psychosocial support OT Self Feeding Anticipated Outcome(s): no goal set OT Basic Self-Care Anticipated Outcome(s): (S) OT Toileting Anticipated Outcome(s): (S) OT Bathroom Transfers Anticipated Outcome(s): (S) OT Recommendation Recommendations for Other Services: Neuropsych consult;Speech consult Patient destination: Home Follow Up Recommendations: Home health OT Equipment Recommended: To be determined   Skilled Therapeutic Intervention Skilled OT evaluation completed. Pt edu on OT POC, ELOS, and rehab expectations. Briefly spoke with pt's wife via phone re past d/c experience and home adaptations. She stated she may be looking for another  place to live  2/2 bathroom not being accessible. Pt completed bathing seated in w/c at sink. Set up assist for UB and max A for LB. Pt unable to achieve enough lateral lean to don/doff pants or wash buttocks without assistance. Suggested transfer back to bed to complete LB tasks. Pt initially attempted a/p transfer but was unable to sequence. Slideboard positioned and pt required mod A. Pt required min-mod A for sitting balance with LOB posteriorly. Pt returned to w/c via slideboard and completed 150 ft of w/c propulsion with (S). Pt completed brief BUE strengthening circuit seated in w/c with 5 # dumbbells. Pt on 2L o2 throughout session with no desaturation, however he did require ample rest breaks. Pt returned to room and was left sitting up with all needs within reach. Wound vac plugged in to power source and chair alarm belt fastened.   OT Evaluation Precautions/Restrictions  Precautions Precautions: Fall Precaution Comments: wound VAC surgical incision. hx of Right AKA & L AKA. 2L oxygen Restrictions Weight Bearing Restrictions: Yes RLE Weight Bearing: Non weight bearing LLE Weight Bearing: Non weight bearing General Chart Reviewed: Yes Family/Caregiver Present: No   Pain Pain Assessment Pain Scale: 0-10 Pain Score: 5  Faces Pain Scale: Hurts little more Pain Type: Surgical pain Pain Intervention(s): Medication (See eMAR) Home Living/Prior [place] expects to be discharged to:: Private residence Living Arrangements: Alone Available Help at Discharge: Family, Available 24 hours/day Type of Home: Apartment Home Access: Level entry Home Layout: One level Bathroom Shower/Tub: Chiropodist: Standard Bathroom Accessibility: No Additional Comments: Pt was completing sink baths d/t bathroom not being accessible  Lives With: Spouse IADL History Occupation: Retired 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  Prior  Function Level of Independence: Requires assistive device for independence, Independent with transfers, Needs assistance with homemaking  Able to Take Stairs?: No Driving: No Vocation: On disability Comments: recent CIR discharge on 08/30/2018 - was at home receiving [facility] therapies   Vision Baseline Vision/History: Wears glasses Wears Glasses: At all times Patient Visual Report: Other (comment)(floaters) Vision Assessment?: No apparent visual deficits Perception  Perception: Within Functional Limits Praxis Praxis: Intact Cognition Overall Cognitive Status: Impaired/Different from baseline Arousal/Alertness: Awake/alert Orientation Level: Person;Place;Situation Person: Oriented Place: Oriented Situation: Oriented Year: 2020 Month: June Day of Week: Correct Memory: Impaired Memory Impairment: Decreased short term memory Decreased Short Term Memory: Verbal complex;Functional complex Immediate Memory Recall: Sock;Blue;Bed Memory Recall Sock: Not able to recall Memory Recall Blue: Without Cue Memory Recall Bed: Without Cue Attention: Sustained Sustained Attention: Impaired Sustained Attention Impairment: Functional complex Awareness: Impaired Awareness Impairment: Anticipatory impairment Safety/Judgment: Impaired Sensation Sensation Light Touch: Impaired by gross assessment Additional Comments: decreased sensation near incisions Coordination Gross Motor Movements are Fluid and Coordinated: No Fine Motor Movements are Fluid and Coordinated: No Coordination and Movement Description: coordination impaired secondary to pain and B LE amputations Finger Nose Finger Test: Grossly ataxic, unclear if d/t cognition Motor  Motor Motor: Within Functional Limits Motor - Skilled Clinical Observations: generalized weakness Mobility  Bed Mobility Bed Mobility: Rolling Right;Rolling Left;Supine to Sit;Sit to Supine Rolling Right: Minimal Assistance - Patient > 75% Rolling Left: Minimal  Assistance - Patient > 75% Supine to Sit: Moderate Assistance - Patient 50-74% Sit to Supine: Moderate Assistance - Patient 50-74%  Trunk/Postural Assessment  Cervical Assessment Cervical Assessment: Exceptions to WFL(forward head posture) Thoracic Assessment Thoracic Assessment: (rounded shoulders) Lumbar Assessment Lumbar Assessment: (posterior pelvic tilt in sitting) Postural Control Postural  Control: Deficits on evaluation Righting Reactions: delayed, posterior lean in sitting  Balance Balance Balance Assessed: Yes Static Sitting Balance Static Sitting - Balance Support: Bilateral upper extremity supported Static Sitting - Level of Assistance: 5: Stand by assistance Dynamic Sitting Balance Dynamic Sitting - Balance Support: Bilateral upper extremity supported Dynamic Sitting - Level of Assistance: 4: Min assist Dynamic Sitting Balance - Compensations: Pt requiring min A for assist for lateral leans Dynamic Sitting - Balance Activities: Reaching across midline;Reaching for objects Sitting balance - Comments: pt with posterior lean during transfers requiring assist for sitting balance, while sitting in bed heavy reliance on bedrails for UE support Extremity/Trunk Assessment RUE Assessment RUE Assessment: Within Functional Limits LUE Assessment LUE Assessment: Within Functional Limits     Refer to Care Plan for Long Term Goals  Recommendations for other services: Neuropsych   Discharge Criteria: Patient will be discharged from OT if patient refuses treatment 3 consecutive times without medical reason, if treatment goals not met, if there is a change in medical status, if patient makes no progress towards goals or if patient is discharged from hospital.  The above assessment, treatment plan, treatment alternatives and goals were discussed and mutually agreed upon: by patient  Curtis Sites 09/17/2018, 12:10 PM

## 2018-09-17 NOTE — Progress Notes (Signed)
Meredith Staggers, MD  Physician  Physical Medicine and Rehabilitation  PMR Pre-admission  Signed  Date of Service:  09/15/2018 1:32 PM      Related encounter: Admission (Discharged) from 09/08/2018 in [facility]      Signed         Show:Clear all [x] Manual[x] Template[x] Copied  Added by: [x] Meredith Staggers, MD[x] Michel Santee, PT  [] Hover for details PMR Admission Coordinator Pre-Admission Assessment  Patient: Howard Bell is an 53 y.o., male MRN: [id number] DOB: June 14, 1965 Height: 6' (182.9 cm) Weight: 91.5 kg  Insurance Information HMO:     PPO:      PCP:      IPA:      80/20:      OTHER:  PRIMARY: Medicare A and B      Policy#: [id number]      Subscriber: patient CM Name:       Phone#:      Fax#:  Pre-Cert#: verified via passport onesource      Employer:  Benefits:  Phone #:      Name:  Eff. Date: Part A 09/19/04, Part B 08/20/05     Deduct: $1408      Out of Pocket Max: n/a      Life Max: n/a CIR: 100%      SNF: 22 days remaining at $176/day Outpatient: 80%     Co-Pay: 20% Home Health: 100%      Co-Pay:  DME: 80%     Co-Pay: 20% Providers: n/a SECONDARY:       Policy#:       Subscriber:  CM Name:       Phone#:      Fax#:  Pre-Cert#:       Employer:  Benefits:  Phone #:      Name:  Eff. Date:      Deduct:       Out of Pocket Max:       Life Max:  CIR:       SNF:  Outpatient:      Co-Pay:  Home Health:       Co-Pay:  DME:      Co-Pay:   Medicaid Application Date:       Case Manager:  Disability Application Date:       Case Worker:   The "Data Collection Information Summary" for patients in Inpatient Rehabilitation Facilities with attached "Privacy Act [place] Records" was provided and verbally reviewed with: Family  Emergency Contact Information         Contact Information    Name Relation Home Work Mobile   Mcmains,[name] Spouse   [phone]      Current Medical History  Patient Admitting  Diagnosis: L AKA  History of Present Illness: Pt is a 53 y/o male with PMH of ESRD (HD MWF), DM type 2, HTN, prior CVA and OSA. 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 Per wife, pt had fallen multiple times at [facility]. F/U with Dr. Sharol Given on 5/12, staples still present, pt not wearing shrinker, new dehiscence of R residual limb, also noted for new ischemic ulceration to posterior aspect of the L ankle and superficial abscess of dorsal lateral aspect of L foot. Return to [facility] for R BKA to AKA revision on 08/04/2018 with Dr. Sharol Given with wound vac application for 1 week. Pt was admitted to CIR on 08/07/2018 and returned to acute on 5/26 for  AMS related to septic gangrene infection to distal portion of L calf.  Dr. Sharol Given consulted and felt no surgical intervention necessary at that time.  Pt cognition returned to baseline and he returned to CIR on 5/28.  He was discharged from CIR on 6/10 at supervision/min guard level with wife able to provide needed assist.  He returned to Dr. 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  He had difficulty weaning from vent following surgery and was admitted to cardiac ICU for weaning managed by critical care.  He was extubated on 09/09/2018.  Hospital course pain management.  Pt evaluated by therapy and recommendations were made for CIR.      Patient's medical record from [facility] has been reviewed by the rehabilitation admission coordinator and physician.  Past Medical History      Past Medical History:  Diagnosis Date  . Arthritis   . Congestive heart failure (CHF) ([place])   . Depression   . 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 Diabetic gastroparesis ([place]) 11/06/2017  . ESRD (end stage renal disease) on dialysis [facility])    M,W.F dialysis  . H/O seasonal allergies   . Hypertension   . Pneumonia   . Sleep  apnea    not using it now, on continuous O2 2 L during the day, 3 L during the night    Family History   family history includes Diabetes in his mother; Hypertension in his father.  Prior Rehab/Hospitalizations Has the patient had prior rehab or hospitalizations prior to admission? Yes  Has the patient had major surgery during 100 days prior to admission? Yes             Current Medications  Current Facility-Administered Medications:  .  0.9 %  sodium chloride infusion, , Intravenous, PRN, Rai, Ripudeep K, MD, Stopped at 09/10/18 1325 .  0.9 %  sodium chloride infusion, 100 mL, Intravenous, PRN, Rai, Ripudeep K, MD .  0.9 %  sodium chloride infusion, 100 mL, Intravenous, PRN, Rai, Ripudeep K, MD .  0.9 %  sodium chloride infusion, 100 mL, Intravenous, PRN, Ejigiri, Thomos Lemons, PA-C .  0.9 %  sodium chloride infusion, 100 mL, Intravenous, PRN, Ejigiri, Thomos Lemons, PA-C .  albuterol (PROVENTIL) (2.5 MG/3ML) 0.083% nebulizer solution 2.5 mg, 2.5 mg, Nebulization, Q4H PRN, Rai, Ripudeep K, MD .  alteplase (CATHFLO ACTIVASE) injection 2 mg, 2 mg, Intracatheter, Once PRN, Rai, Ripudeep K, MD .  amLODipine (NORVASC) tablet 10 mg, 10 mg, Oral, Daily, Rai, Ripudeep K, MD, Stopped at 09/15/18 0809 .  bisacodyl (DULCOLAX) suppository 10 mg, 10 mg, Rectal, Daily PRN, Rai, Ripudeep K, MD .  brimonidine (ALPHAGAN) 0.2 % ophthalmic solution 1 drop, 1 drop, Both Eyes, BID, Rai, Ripudeep K, MD, 1 drop at 09/14/18 2141 .  calcium acetate (PHOSLO) capsule 1,334 mg, 1,334 mg, Oral, TID WC, Rai, Ripudeep K, MD, Stopped at 09/15/18 1251 .  chlorhexidine (PERIDEX) 0.12 % solution 15 mL, 15 mL, Mouth Rinse, BID, Rai, Ripudeep K, MD, 15 mL at 09/14/18 2143 .  chlorhexidine gluconate (MEDLINE KIT) (PERIDEX) 0.12 % solution 15 mL, 15 mL, Mouth Rinse, BID, Rai, Ripudeep K, MD, 15 mL at 09/14/18 0950 .  Chlorhexidine Gluconate Cloth 2 % PADS 6 each, 6 each, Topical, Q0600, Roney Jaffe, MD, 6 each at  09/14/18 [phone] .  Darbepoetin Alfa (ARANESP) injection 200  mcg, 200 mcg, Intravenous, Q Fri-HD, Ejigiri, Ogechi Grace, PA-C .  docusate sodium (COLACE) capsule 100 mg, 100 mg, Oral, BID, Rai, Ripudeep K, MD, 100 mg at 09/15/18 0828 .  feeding supplement (NEPRO CARB STEADY) liquid 237 mL, 237 mL, Oral, Q24H, Rai, Ripudeep K, MD, 237 mL at 09/14/18 1312 .  feeding supplement (PRO-STAT SUGAR FREE 64) liquid 30 mL, 30 mL, Oral, BID, Rai, Ripudeep K, MD, 30 mL at 09/15/18 0828 .  ferric gluconate (NULECIT) 62.5 mg in sodium chloride 0.9 % 100 mL IVPB, 62.5 mg, Intravenous, Q Wed-HD, Rai, Ripudeep K, MD, Last Rate: 105 mL/hr at 09/13/18 1300 .  [START ON 09/20/2018] ferric gluconate (NULECIT) 62.5 mg in sodium chloride 0.9 % 100 mL IVPB, 62.5 mg, Intravenous, Q Wed-HD, Alric Seton, PA-C .  fluticasone (FLONASE) 50 MCG/ACT nasal spray 1 spray, 1 spray, Each Nare, Daily, Rai, Ripudeep K, MD, 1 spray at 09/14/18 0950 .  heparin injection 1,000 Units, 1,000 Units, Dialysis, PRN, Lynnda Child, PA-C .  heparin injection 1,800 Units, 20 Units/kg, Dialysis, PRN, Lynnda Child, PA-C .  heparin injection 5,000 Units, 5,000 Units, Subcutaneous, Q8H, Rai, Ripudeep K, MD, 5,000 Units at 09/15/18 0545 .  hydrALAZINE (APRESOLINE) injection 10 mg, 10 mg, Intravenous, Q4H PRN, Rai, Ripudeep K, MD, 10 mg at 09/09/18 6314 .  HYDROmorphone (DILAUDID) injection 1 mg, 1 mg, Intravenous, Q3H PRN, Rayburn, Shawn Montgomery, PA-C .  insulin aspart (novoLOG) injection 0-5 Units, 0-5 Units, Subcutaneous, QHS, Rai, Ripudeep K, MD .  insulin aspart (novoLOG) injection 0-9 Units, 0-9 Units, Subcutaneous, TID WC, Rai, Ripudeep K, MD, Stopped at 09/15/18 1251 .  lidocaine (PF) (XYLOCAINE) 1 % injection 5 mL, 5 mL, Intradermal, PRN, Rai, Ripudeep K, MD .  lidocaine-prilocaine (EMLA) cream 1 application, 1 application, Topical, PRN, Rai, Ripudeep K, MD .  MEDLINE mouth rinse, 15 mL, Mouth Rinse, q12n4p, Rai, Ripudeep K,  MD, 15 mL at 09/10/18 1947 .  menthol-cetylpyridinium (CEPACOL) lozenge 3 mg, 1 lozenge, Oral, PRN, Newt Minion, MD .  metoCLOPramide (REGLAN) tablet 5 mg, 5 mg, Oral, TID AC, Rai, Ripudeep K, MD, 5 mg at 09/15/18 0827 .  metoprolol succinate (TOPROL-XL) 24 hr tablet 50 mg, 50 mg, Oral, Daily, Rai, Ripudeep K, MD, Stopped at 09/15/18 9702 .  multivitamin (RENA-VIT) tablet 1 tablet, 1 tablet, Oral, QHS, Rai, Ripudeep K, MD, 1 tablet at 09/14/18 2143 .  mupirocin ointment (BACTROBAN) 2 %, , Nasal, BID, Rai, Ripudeep K, MD .  ondansetron (ZOFRAN) tablet 4 mg, 4 mg, Oral, Q6H PRN **OR** ondansetron (ZOFRAN) injection 4 mg, 4 mg, Intravenous, Q6H PRN, Rai, Ripudeep K, MD .  oxyCODONE (Oxy IR/ROXICODONE) immediate release tablet 5-10 mg, 5-10 mg, Oral, Q4H PRN, Rayburn, Shawn Montgomery, PA-C .  pantoprazole (PROTONIX) EC tablet 40 mg, 40 mg, Oral, Q0600, Rai, Ripudeep K, MD, 40 mg at 09/15/18 0546 .  pentafluoroprop-tetrafluoroeth (GEBAUERS) aerosol 1 application, 1 application, Topical, PRN, Rai, Ripudeep K, MD .  timolol (TIMOPTIC) 0.5 % ophthalmic solution 1 drop, 1 drop, Both Eyes, BID, Rai, Ripudeep K, MD, 1 drop at 09/14/18 2150  Patients Current Diet:     Diet Order                  Diet renal/carb modified with fluid restriction Diet-HS Snack? Nothing; Fluid restriction: 1200 mL Fluid; Room service appropriate? No; Fluid consistency: Thin  Diet effective now               Precautions / Restrictions  Precautions Precautions: Fall Restrictions Weight Bearing Restrictions: Yes RLE Weight Bearing: Non weight bearing LLE Weight Bearing: Non weight bearing   Has the patient had 2 or more falls or a fall with injury in the past year? Yes  Prior Activity Level Limited Community (1-2x/wk): has been a primary w/c user since his R AKA in May 2019, was on rehab/acute/rehab 5/18-6/10  Prior Functional Level Self Care: Did the patient need help bathing, dressing, using the  toilet or eating? Needed some help  Indoor Mobility: Did the patient need assistance with walking from room to room (with or without device)? Needed some help  Stairs: Did the patient need assistance with internal or external stairs (with or without device)? Needed some help  Functional Cognition: Did the patient need help planning regular tasks such as shopping or remembering to take medications? Needed some help  Home Assistive Devices / Equipment Home Assistive Devices/Equipment: CBG Meter, Oxygen, Wheelchair Home Equipment: Environmental consultant - 2 wheels, Wheelchair - manual, Bedside commode, Shower seat(sliding board)  Prior Device Use: Indicate devices/aids used by the patient prior to current illness, exacerbation or injury? Manual wheelchair and slide board  Current Functional Level Cognition  Overall Cognitive Status: Impaired/Different from baseline Orientation Level: Oriented X4 Following Commands: Follows one step commands consistently, Follows one step commands with increased time Safety/Judgement: Decreased awareness of safety    Extremity Assessment (includes Sensation/Coordination)  Upper Extremity Assessment: Generalized weakness  Lower Extremity Assessment: Defer to PT evaluation, RLE deficits/detail, LLE deficits/detail RLE Deficits / Details: AKA LLE Deficits / Details: AKA    ADLs  Overall ADL's : Needs assistance/impaired Eating/Feeding: Set up, Sitting Grooming: Set up, Sitting Upper Body Bathing: Min guard, Sitting Lower Body Bathing: Min guard, Sitting/lateral leans Upper Body Dressing : Min guard, Sitting Lower Body Dressing: Maximal assistance, Sitting/lateral leans Toilet Transfer: Maximal assistance, +2 for physical assistance, +2 for safety/equipment, Transfer board Toilet Transfer Details (indicate cue type and reason): simulated transfer from EOB to recliner  Toileting- Clothing Manipulation and Hygiene: Maximal assistance, +2 for physical  assistance, +2 for safety/equipment, Sitting/lateral lean Functional mobility during ADLs: Maximal assistance, +2 for physical assistance, +2 for safety/equipment, Wheelchair    Mobility  Overal bed mobility: Needs Assistance Bed Mobility: Supine to Sit Supine to sit: Max assist, +2 for physical assistance General bed mobility comments: required use of bed pad and HOB elevated to come to EOB    Transfers  Overall transfer level: Needs assistance Transfers: Lateral/Scoot Transfers  Lateral/Scoot Transfers: Max assist, Total assist, +2 physical assistance General transfer comment: Max/Total A +2 for lateral transfer to recliner towards R side - attempted to have patient push up from B UE with limited success - use of bed pad to assist    Ambulation / Gait / Stairs / Wheelchair Mobility       Posture / Balance Balance Overall balance assessment: Needs assistance Sitting-balance support: Bilateral upper extremity supported Sitting balance-Leahy Scale: Fair    Special needs/care consideration BiPAP/CPAP no CPM no Continuous Drip IV no Dialysis yes        Days MWF Life Vest no Oxygen 2 L via nasal cannual Special Bed no Trach Size no Wound Vac (area) yes      Location L AKA Skin general ecchymosis, healing incision to R AKA, new L AKA                        Bowel mgmt: continent, last BM 09/15/2018 Bladder mgmt: anuric Diabetic  mgmt: yes Behavioral consideration no Chemo/radiation no   Previous Home Environment (from acute therapy documentation) Living Arrangements: Spouse/significant other, Children  Lives With: Spouse Available Help at Discharge: Family, Available 24 hours/day Type of Home: Apartment Home Layout: One level Home Access: Level entry(ground level) Bathroom Shower/Tub: Chiropodist: Standard Home Care Services: Yes Type of Home Care Services: Home OT, Home PT  Discharge Living Setting Plans for Discharge Living Setting:  Patient's home Type of Home at Discharge: Apartment Discharge Home Layout: One level Discharge Home Access: Level entry Discharge Bathroom Shower/Tub: Tub/shower unit Discharge Bathroom Toilet: Standard Discharge Bathroom Accessibility: Yes How Accessible: Accessible via wheelchair Does the patient have any problems obtaining your medications?: No  Social/Family/Support Systems Patient Roles: Spouse, Parent Anticipated Caregiver: Kenney Houseman, spouse Anticipated Caregiver's Contact Information: [phone] Ability/Limitations of Caregiver: min assist, has been providing assist since recent d/c from CIR Caregiver Availability: 24/7 Discharge Plan Discussed with Primary Caregiver: Yes Is Caregiver In Agreement with Plan?: Yes Does Caregiver/Family have Issues with Lodging/Transportation while Pt is in Rehab?: No  Goals/Additional Needs Patient/Family Goal for Rehab: PT/OT supervision w/c level, SLP supervision Expected length of stay: 14-16 days Dietary Needs: renal/carb modified, 1200 mL fluid restriction Equipment Needs: will likely need new w/c with adjusted rear axel 2/2 now a bilat AKA Pt/Family Agrees to Admission and willing to participate: Yes Program Orientation Provided & Reviewed with Pt/Caregiver Including Roles  & Responsibilities: Yes  Decrease burden of Care through IP rehab admission: n/a  Possible need for SNF placement upon discharge: not anticipated   Patient Condition: I have reviewed medical records from [facility], spoken with CM, and patient and spouse. I met with patient at the bedside and discussed with spouse via phone for inpatient rehabilitation assessment.  Patient will benefit from ongoing PT, OT and SLP, can actively participate in 3 hours of therapy a day 5 days of the week, and can make measurable gains during the admission.  Patient will also benefit from the coordinated team approach during an Inpatient Acute Rehabilitation admission.  The  patient will receive intensive therapy as well as Rehabilitation physician, nursing, social worker, and care management interventions.  Due to safety, skin/wound care, disease management, medication administration, pain management and patient education the patient requires 24 hour a day rehabilitation nursing.  The patient is currently max +2 with mobility and basic ADLs.  Discharge setting and therapy post discharge at home with home health is anticipated.  Patient has agreed to participate in the Acute Inpatient Rehabilitation Program and will admit Saturday, September 16, 2018.  Preadmission Screen Completed By:  Michel Santee, PT, DPT 09/15/2018 1:32 PM ______________________________________________________________________   Discussed status with Dr. Naaman Plummer on 09/15/18  at 2:04 PM  and received approval for admission today.  Admission Coordinator:  Michel Santee, PT, DPT time 2:05 PM Sudie Grumbling 09/15/18    Assessment/Plan: Diagnosis: left AKA 1. Does the need for close, 24 hr/day Medical supervision in concert with the patient's rehab needs make it unreasonable for this patient to be served in a less intensive setting? Yes 2. Co-Morbidities requiring supervision/potential complications: ESRD, HTN, prior CVA 3. Due to bladder management, bowel management, safety, skin/wound care, disease management, medication administration, pain management and patient education, does the patient require 24 hr/day rehab nursing? Yes 4. Does the patient require coordinated care of a physician, rehab nurse, PT (1-2 hrs/day, 5 days/week), OT (1-2 hrs/day, 5 days/week) and SLP (1-2 hrs/day, 5 days/week) to address physical and functional deficits  in the context of the above medical diagnosis(es)? Yes Addressing deficits in the following areas: balance, endurance, locomotion, strength, transferring, bowel/bladder control, bathing, dressing, feeding, grooming, toileting, cognition and psychosocial support 5. Can the  patient actively participate in an intensive therapy program of at least 3 hrs of therapy 5 days a week? Yes 6. The potential for patient to make measurable gains while on inpatient rehab is excellent 7. Anticipated functional outcomes upon discharge from inpatients are: supervision PT, supervision OT, supervision SLP 8. Estimated rehab length of stay to reach the above functional goals is: 14-16 days 9. Anticipated D/C setting: Home 10. Anticipated post D/C treatments: [place] therapy 11. Overall Rehab/Functional Prognosis: excellent  MD Signature: Meredith Staggers, MD, [place] [facility] 09/16/2018         Revision History

## 2018-09-17 NOTE — Progress Notes (Signed)
[place] PHYSICAL MEDICINE & REHABILITATION PROGRESS NOTE   Subjective/Complaints: Having phantom pain left leg. Didn't sleep too well as a result  ROS: Limited due to cognitive/behavioral    Objective:   No results found. Recent Labs    09/15/18 0721  WBC 6.1  HGB 7.6*  HCT 24.6*  PLT 203   Recent Labs    09/15/18 0721  NA 130*  K 4.2  CL 92*  CO2 25  GLUCOSE 157*  BUN 58*  CREATININE 10.27*  CALCIUM 9.1    Intake/Output Summary (Last 24 hours) at 09/17/2018 0826 Last data filed at 09/16/2018 2300 Gross per 24 hour  Intake 112 ml  Output -  Net 112 ml     Physical Exam: Vital Signs Blood pressure 126/88, pulse 89, temperature 98.2 F (36.8 C), temperature source Oral, resp. rate 17, height 6' (1.829 m), weight 91.3 kg, SpO2 93 %. Constitutional: No distress . Vital signs reviewed. obese HEENT: EOMI, oral membranes moist Neck: supple Cardiovascular: RRR without murmur. No JVD    Respiratory: CTA Bilaterally without wheezes or rales. Normal effort    GI: BS +, non-tender, non-distended  Musculoskeletal:  Comments: Wound VAC on L-AKA. Right AKA with sutures and staples in place. Neurological: He is alertand oriented to person, place, and time. UE 5/5. RLE can lift against gravity 3/5, LLE 2/5--motor unchanged. Skin: Vac L AKA, Right AKA with staples, scab, remains dry. Psychiatric:  Pt cooperative, flat    Assessment/Plan: 1. Functional deficits secondary to left AKA which require 3+ hours per day of interdisciplinary therapy in a comprehensive inpatient rehab setting.  Physiatrist is providing close team supervision and 24 hour management of active medical problems listed below.  Physiatrist and rehab team continue to assess barriers to discharge/monitor patient progress toward functional and medical goals  Care Tool:  Bathing              Bathing assist       Upper Body Dressing/Undressing Upper body dressing         Upper body assist      Lower Body Dressing/Undressing Lower body dressing            Lower body assist       Toileting Toileting    Toileting assist Assist for toileting: Moderate Assistance - Patient 50 - 74%     Transfers Chair/bed transfer  Transfers assist  Chair/bed transfer activity did not occur: Safety/medical concerns        Locomotion Ambulation   Ambulation assist              Walk 10 feet activity   Assist           Walk 50 feet activity   Assist           Walk 150 feet activity   Assist           Walk 10 feet on uneven surface  activity   Assist           Wheelchair     Assist               Wheelchair 50 feet with 2 turns activity    Assist            Wheelchair 150 feet activity     Assist          Medical Problem List and Plan: 1.Functional deficitssecondary to PAD, new left AKA (09/08/2018), recent right AKA (08/04/2018) Patient is beginning CIR  therapies today including PT and OT and SLP 2. Antithrombotics: -DVT/anticoagulation:Pharmaceutical:Heparin -antiplatelet therapy: N/A 3. Pain Management:Oxycodone prn  -add gabapentin for phantom pain LLE, 100mg  qhs 4. Mood:LCSW to follow for evaluation and support. -antipsychotic agents: N/A 5. Neuropsych: This patientis not fullycapable of making decisions on hisown behalf. 6. Skin/Wound Care:Continue wound VACLLE -staples R AKA probably ready to come out soon 7. Fluids/Electrolytes/Nutrition:Strict I/O. Daily weights. Labs with HD. 8. OSA/Chronic respiratory failure: Oxygen dependent--2 L during the day and 3 liters at nights. 9. HTN: Monitor BP TID--On Norvasc and metoprolol daily 10. T2DM: Brittle diabetic. Continue SSI as at home.   -Reglan tid ac for gastroparesis.   -reportedly on insulin, but I see no record of him currently taking insulin even prior to this  admit.   -begin lantus 5u daily in AM, adjust regimen further as indicated 11. Glaucoma with poor vision: On Timoptic at bedtime and Alphagan bid 12. Acute on chronic anemia: On Aranesp weekly. 13. ESRD: HD MWF.  -HD after therapy day to allow participation in rehab activities  -nephrology following    LOS: 1 days A FACE TO [place] 09/17/2018, 8:26 AM

## 2018-09-17 NOTE — Evaluation (Signed)
Physical Therapy Assessment and Plan  Patient Details  Name: Howard Bell MRN: [id number] Date of Birth: 1965/04/14  PT Diagnosis: Abnormal posture, Cognitive deficits, Impaired sensation, Muscle weakness and Pain in joint Rehab Potential: Good ELOS: 10-14 days   Today's Date: 09/17/2018 PT Individual Time: 0802-0900 PT Individual Time Calculation (min): 58 min    Problem List:  Patient Active Problem List   Diagnosis Date Noted  . Pressure injury of skin 09/16/2018  . Status post bilateral above knee amputation ([place]) 09/08/2018  . Acute respiratory failure with hypercapnia ([place])   . Atherosclerosis of native arteries of extremities with gangrene, left leg ([place]) 09/05/2018  . Hypoxia   . ESRD (end stage renal disease) ([place])   . Labile blood glucose   . Postoperative pain   . Dry gangrene (HCC)--left foot/left shin   . SIRS (systemic inflammatory response syndrome) (HCC)   . Poorly controlled type 2 diabetes mellitus with peripheral neuropathy ([place])   . Peripheral arterial disease ([place])   . Unilateral AKA, right ([place]) 08/17/2018  . Sepsis ([place]) 08/15/2018  . Acute metabolic encephalopathy 28/31/5176  . Gangrene of left foot ([place])   . Hypoglycemia   . Drug induced constipation   . Confusion, postoperative   . Labile blood pressure   . Uncontrolled diabetes mellitus type 2 with peripheral artery disease ([place])   . Anemia of chronic disease   . Diabetes mellitus type 2 in nonobese (HCC)   . Supplemental oxygen dependent   . Unilateral AKA, left ([place]) 08/07/2018  . S/P BKA (below knee amputation), right ([place]) 08/04/2018  . Severe protein-calorie malnutrition ([place]) 08/01/2018  . Dehiscence of amputation stump ([place]) 08/01/2018  . Cutaneous abscess of left foot 08/01/2018  . Cellulitis of right lower extremity   . Influenza A 05/12/2018  . Pneumonia 05/12/2018  . Gangrene of right foot ([place])   . Critical limb ischemia with history of revascularization of same extremity  05/09/2018  . LUQ pain   . 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 ([place]) 11/16/2017  . CKD stage 5 due to type 2 diabetes mellitus ([place]) 11/16/2017  . Glaucoma due to type 2 diabetes mellitus ([place]) 11/16/2017  . Chronic generalized pain 11/16/2017  . Recurrent pneumonia 11/06/2017  . Diabetic gastroparesis ([place]) 11/06/2017  . Generalized abdominal pain 09/13/2017  . Acute pain of right shoulder 07/26/2017  . Acute bursitis of right shoulder 07/26/2017  . Left arm swelling 03/28/2017  . ESRD on dialysis ([place]) 03/28/2017  . Chest pain 11/26/2016  . Essential hypertension 11/26/2016  . Anemia due to end stage renal disease ([place]) 08/04/2016  . Acute respiratory failure with hypoxemia ([place]) 08/04/2016  . Type 2 diabetes mellitus with foot ulcer ([place]) 08/04/2016  . Acute CHF (congestive heart failure) ([place]) 08/04/2016  . Elevated troponin     Past Medical History:  Past Medical History:  Diagnosis Date  . Arthritis   . Congestive heart failure (CHF) ([place])   . Depression   . 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 Diabetic gastroparesis ([place]) 11/06/2017  . ESRD (end stage renal disease) on dialysis [facility])    M,W.F dialysis  . H/O seasonal allergies   . Hypertension   . Pneumonia   . Sleep apnea    not using it now, on continuous O2 2 L during the day, 3 L during the night  Past Surgical History:  Past Surgical History:  Procedure Laterality Date  . AMPUTATION Right 05/14/2018   Procedure: AMPUTATION BELOW KNEE;  Surgeon: Newt Minion, MD;  Location: [facility];  Service: Orthopedics;  Laterality: Right;  . AMPUTATION Right 08/04/2018   Procedure: RIGHT ABOVE KNEE AMPUTATION;  Surgeon: Newt Minion, MD;  Location: [facility];  Service: Orthopedics;  Laterality: Right;  . AMPUTATION Left 09/08/2018   Procedure: LEFT ABOVE KNEE  AMPUTATION;  Surgeon: Newt Minion, MD;  Location: [facility];  Service: Orthopedics;  Laterality: Left;  . APPLICATION OF WOUND VAC Left 09/08/2018   Procedure: Application Of Wound Vac;  Surgeon: Newt Minion, MD;  Location: [facility];  Service: Orthopedics;  Laterality: Left;  . ESOPHAGOGASTRODUODENOSCOPY (EGD) WITH PROPOFOL N/A 12/05/2017   Procedure: ESOPHAGOGASTRODUODENOSCOPY (EGD) WITH PROPOFOL;  Surgeon: Doran Stabler, MD;  Location: [facility];  Service: Gastroenterology;  Laterality: N/A;  . HERNIA REPAIR    . IR AV DIALY SHUNT INTRO NEEDLE/INTRACATH INITIAL W/PTA/IMG LEFT  03/30/2017    Assessment & Plan Clinical Impression: Patient is a 53 y.o. year old male with history of T2DM with neuropathy and retinopathy, ESRD- HD ion MWF, OSA with chronic hypoxic respiratory failure--oxygen dependent, PVD with gangrenous changes BLE s/p R-AKA with recent CIR stay. He was admitted on 09/08/18 for L-AKA due to progressive gangrenous changes. Post op with acute on chronic respiratory failure and hypotension requiring pressors. He was extubated to BIPAP on 6/20 and mentation slowly improving. He has been weaned off BIPAP and acute on chronic anemia being monitored. Therapy evaluations completed revealing decline in functional status and CIR recommended for follow up therapy. Patient transferred to CIR on 09/16/2018 .   Patient currently requires mod with mobility secondary to muscle weakness and muscle joint tightness, decreased coordination and decreased sitting balance.  Prior to hospitalization, patient was modified independent  with mobility and lived with Spouse in a [place] home.  Home access is  Level entry.  Patient will benefit from skilled PT intervention to maximize safe functional mobility, minimize fall risk and decrease caregiver burden for planned discharge home with 24 hour supervision.  Anticipate patient will benefit from follow up [place] at discharge.  PT - End of Session Activity  Tolerance: Tolerates 30+ min activity with multiple rests Endurance Deficit: Yes PT Assessment Rehab Potential (ACUTE/IP ONLY): Good PT Patient demonstrates impairments in the following area(s): Balance;Safety;Behavior;Sensory;Edema;Skin Integrity;Endurance;Motor;Nutrition;Pain;Perception PT Transfers Functional Problem(s): Bed Mobility;Bed to Chair;Car;Furniture PT Locomotion Functional Problem(s): Wheelchair Mobility PT Plan PT Intensity: Minimum of 1-2 x/day ,45 to 90 minutes PT Frequency: 5 out of 7 days PT Duration Estimated Length of Stay: 10-14 days PT Treatment/Interventions: Balance/vestibular training;Functional mobility training;Patient/family education;Therapeutic Exercise;Therapeutic Activities;Skin care/wound management;UE/LE Strength taining/ROM;Wheelchair propulsion/positioning;DME/adaptive equipment instruction PT Transfers Anticipated Outcome(s): supervision slideboard or A-P transfer PT Recommendation Follow Up Recommendations: Home health PT;24 hour supervision/assistance Patient destination: Home Equipment Recommended: To be determined Equipment Details: pt has w/c and slideboard  Skilled Therapeutic Intervention Evaluation completed (see details above and below) with education on PT POC and goals and individual treatment initiated with focus on functional mobility, transfers and limb loss education. Pt supine in bed upon PT arrival, agreeable to therapy tx and denies pain. Pt performed rolling this session with min assist in both directions in order to don briefs and shorts, min assist to pull over hips. Pt transferred to sitting with mod assist, then x 1 posterior LOB this session back to supine. Pt transferred back to sitting with  heavy reliance on bedrails and mod assist. Pt instructed in A-P transfer from bed>w/c and performed this transfer with max assist and cues for anterior weightshift/trunk flexion while scooting backwards. Pt performed w/c mobility throughout  session 2 x 150 ft using B UEs with supervision. Pt performed car transfer this session to sedan height car with mod assist and slideboard, cues for techniques and head hips relationship. Pt left seated in w/c at the sink with needs in reach and chair alarm set.     PT Evaluation Precautions/Restrictions Precautions Precautions: Fall Precaution Comments: wound VAC surgical incision. hx of Right AKA & L AKA. 2L oxygen Restrictions Weight Bearing Restrictions: Yes LLE Weight Bearing: Non weight bearing General   Vital SignsTherapy Vitals Temp: 98.2 F (36.8 C) Temp Source: Oral Pulse Rate: 89 Resp: 17 BP: 126/88 Patient Position (if appropriate): Lying Oxygen Therapy SpO2: 93 % O2 Device: Room Air Pain   pt reports pain 2/10 in residual limb Home Living/Prior Functioning Home Living Available Help at Discharge: Family;Available 24 hours/day Type of Home: Apartment Home Access: Level entry Home Layout: One level  Lives With: Spouse Prior Function Level of Independence: Requires assistive device for independence;Independent with transfers  Able to Take Stairs?: No Driving: No Vocation: On disability Comments: recent CIR discharge on 08/30/2018 - was at home receiving HH therapies Cognition Overall Cognitive Status: Impaired/Different from baseline Arousal/Alertness: Awake/alert Orientation Level: Oriented X4 Attention: Sustained Sustained Attention: Impaired Sustained Attention Impairment: Functional complex Memory: Impaired Memory Impairment: Decreased short term memory Awareness: Impaired Safety/Judgment: Impaired Sensation Sensation Light Touch: Impaired by gross assessment Additional Comments: decreased sensation near incisions Coordination Gross Motor Movements are Fluid and Coordinated: No Fine Motor Movements are Fluid and Coordinated: No Coordination and Movement Description: coordination impaired secondary to pain and B LE amputations Motor  Motor Motor:  Within Functional Limits Motor - Skilled Clinical Observations: generalized weakness  Mobility Bed Mobility Bed Mobility: Rolling Right;Rolling Left;Supine to Sit;Sit to Supine Rolling Right: Minimal Assistance - Patient > 75% Rolling Left: Minimal Assistance - Patient > 75% Supine to Sit: Moderate Assistance - Patient 50-74% Sit to Supine: Moderate Assistance - Patient 50-74% Transfers Transfers: Development worker, community: Maximal Assistance - Patient 25-49% Locomotion  Gait Ambulation: No Gait Gait: No Stairs / Additional Locomotion Stairs: No Architect: Yes Wheelchair Assistance: Chartered loss adjuster: Both upper extremities Wheelchair Parts Management: Supervision/cueing Distance: 150 ft  Trunk/Postural Assessment  Cervical Assessment Cervical Assessment: Exceptions to WFL(forward head posture) Thoracic Assessment Thoracic Assessment: Exceptions to WFL(rounded shoulders) Lumbar Assessment Lumbar Assessment: Exceptions to WFL(posterior pelvic tilt in sitting) Postural Control Postural Control: Deficits on evaluation Righting Reactions: delayed, posterior lean in sitting  Balance Balance Balance Assessed: Yes Dynamic Sitting Balance Dynamic Sitting - Level of Assistance: 4: Min assist;3: Mod assist Sitting balance - Comments: pt with posterior lean during transfers requiring assist for sitting balance, while sitting in bed heavy reliance on bedrails for UE support Extremity Assessment  RLE Assessment Passive Range of Motion (PROM) Comments: limited by pain in residual limb (AKA), tight hip flexors General Strength Comments: grossly 3-/5 at hip LLE Assessment LLE Assessment: Exceptions to [facility] Passive Range of Motion (PROM) Comments: limited by pain in residual limb (AKA), tight hip flexors General Strength Comments: grossly 3-/5 at the hip    Refer to Care Plan for Long Term  Goals  Recommendations for other services: Neuropsych  Discharge Criteria: Patient will be discharged from PT if patient refuses treatment 3 consecutive times without  medical reason, if treatment goals not met, if there is a change in medical status, if patient makes no progress towards goals or if patient is discharged from hospital.  The above assessment, treatment plan, treatment alternatives and goals were discussed and mutually agreed upon: by patient  Netta Corrigan, PT, DPT 09/17/2018, 8:44 AM

## 2018-09-17 NOTE — IPOC Note (Signed)
Individualized overall Plan of Care [facility]) Patient Details Name: Howard Bell MRN: [id number] DOB: September 27, 1965  Admitting Diagnosis: Left AKA  Hospital Problems: Active Problems:   Unilateral AKA, left (HCC)   S/P AKA (above knee amputation) unilateral, right (HCC)   S/P AKA (above knee amputation) bilateral (HCC)   Acute on chronic anemia   Diabetic peripheral neuropathy (HCC)     Functional Problem List: Nursing Medication Management, Pain, Skin Integrity  PT Balance, Safety, Behavior, Sensory, Edema, Skin Integrity, Endurance, Motor, Nutrition, Pain, Perception  OT Balance, Skin Integrity, Cognition, Endurance, Motor, Pain, Perception, Safety, Sensory  SLP    TR         Basic ADL's: OT Bathing, Dressing, Toileting     Advanced  ADL's: OT       Transfers: PT Bed Mobility, Bed to Chair, Car, Chief Operating Officer: PT Wheelchair Mobility     Additional Impairments: OT None  SLP        TR      Anticipated Outcomes Item Anticipated Outcome  Self Feeding no goal set  Swallowing      Basic self-care  (S)  Toileting  (S)   Bathroom Transfers (S)  Bowel/Bladder  mod i  Transfers  supervision slideboard or A-P transfer  Locomotion     Communication     Cognition  Mod I orientation, Supervision A basic problem solving, Min A sustained attention, Mod A recall and emergent  Pain  less than 3 out of 10  Safety/Judgment  mod I   Therapy Plan: PT Intensity: Minimum of 1-2 x/day ,45 to 90 minutes PT Frequency: 5 out of 7 days PT Duration Estimated Length of Stay: 10-14 days OT Intensity: Minimum of 1-2 x/day, 45 to 90 minutes OT Frequency: 5 out of 7 days OT Duration/Estimated Length of Stay: 2-3 weeks SLP Intensity: Minumum of 1-2 x/day, 30 to 90 minutes SLP Frequency: 3 to 5 out of 7 days SLP Duration/Estimated Length of Stay: 14-16 days    Team Interventions: Nursing Interventions Patient/Family Education, Medication Management,  Skin Care/Wound Management, Pain Management, Discharge Planning  PT interventions Balance/vestibular training, Functional mobility training, Patient/family education, Therapeutic Exercise, Therapeutic Activities, Skin care/wound management, UE/LE Strength taining/ROM, Wheelchair propulsion/positioning, DME/adaptive equipment instruction  OT Interventions Balance/vestibular training, Neuromuscular re-education, Self Care/advanced ADL retraining, Therapeutic Exercise, Wheelchair propulsion/positioning, DME/adaptive equipment instruction, Pain management, Skin care/wound managment, UE/LE Strength taining/ROM, Patient/family education, UE/LE Coordination activities, Discharge planning, Functional mobility training, Therapeutic Activities, Cognitive remediation/compensation, Disease mangement/prevention, Community reintegration, Psychosocial support  SLP Interventions Cognitive remediation/compensation, English as a second language teacher, Functional tasks, Patient/family education, Therapeutic Activities, Environmental controls, Internal/external aids  TR Interventions    SW/CM Interventions Discharge Planning, Psychosocial Support, Patient/Family Education   Barriers to Discharge MD  Medical stability, Wound care and Weight bearing restrictions  Nursing Wound Care, Weight bearing restrictions    PT      OT      SLP      SW       Team Discharge Planning: Destination: PT-Home ,OT- Home , SLP-Home Projected Follow-up: PT-Home health PT, 24 hour supervision/assistance, OT-  Home health OT, SLP-24 hour supervision/assistance, Home Health SLP Projected Equipment Needs: PT-To be determined, OT- To be determined, SLP-None recommended by SLP Equipment Details: PT-pt has w/c and slideboard, OT-  Patient/family involved in discharge planning: PT- Patient,  OT-Patient, SLP-Patient  MD ELOS: 5-9 days. Medical Rehab Prognosis:  Good Assessment: Howard Bell is a 53 year old male with history of T2DM  with neuropathy  and retinopathy, ESRD- HD ion MWF, OSA with chronic hypoxic respiratory failure--oxygen dependent, PVD with gangrenous changes BLE s/p R-AKA with recent CIR stay. He was admitted on 09/08/18 for L-AKA due to progressive gangrenous changes. Post op with acute on chronic respiratory failure and hypotension requiring pressors. He was extubated to BIPAP on 6/20 and mentation slowly improving. He has been weaned off BIPAP and acute on chronic anemia being monitored.  Patient with resulting functional deficits with mobility, transfers, self-care.  Will set goals for Supervision with PT/OT/SLP.  Due to the current state of emergency, patients may not be receiving their 3-hours of Medicare-mandated therapy.  See Team Conference Notes for weekly updates to the plan of care

## 2018-09-17 NOTE — Progress Notes (Signed)
[facility] Progress Note   Dialysis Orders: AF MWF   4h 65min  450/800  93kg  2/2.25 bath  Hep 9000  L AVG - parsabiv 10, iPTH 640 6/17  - Hb 8 at dc 6/10 on home O2 - Mircera 200 given 6/16 - tsat after last hosp d/c 37% - had been offParsabiv during recent hospitalization    Assessment/ Plan:   Assessment/Plan: 1. Debility: on CIR now 2. 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 Then went to CIR, then came back for AMS. Then went back to CIR. Then underwent L AKA 09/08/18 for multiple wounds not responding to Rx.  Now is bilat AKA.  3. ESRD- MWF HD. Next HD Monday 4. Hypertension/volume- down 2kg, should be lower after bilat AKA 5. Anemia-hgb 7.6 trending up - some post op loss received 200 Mircera 6/16;  Resumed 200 Aranesp q Friday.. tsat 37% 6/17 - added weekly Fe  6. Metabolic bone disease- Holding parsabiv while here due to unavailability; resuming binders now as eating (phoslo) - Ca a little high without parsabiv - 7. Nutrition- chronic low alb./ prostat ordered 8. DM- per primary    Kelly Splinter  MD 09/17/2018, 2:38 PM     Subjective:   Still pain from AKA  Objective Vitals:   09/16/18 1526 09/16/18 1939 09/17/18 0048 09/17/18 0611  BP:  (!) 156/71  126/88  Pulse:  86 84 89  Resp:  18 18 17   Temp:  99 F (37.2 C)  98.2 F (36.8 C)  TempSrc:  Oral  Oral  SpO2:  98% 98% 93%  Weight: 94.7 kg   91.3 kg  Height: 6' (1.829 m)      Physical Exam General:NAD off o2 at present  Heart: RRR Lungs: grossly clear  Abdomen:obese soft NT Extremities: left AKA with VAC min edema right AKA staples intact no sig edema Dialysis Access:  Left AVGG + bruit   Additional Objective Labs: Basic Metabolic Panel: Recent Labs  Lab 09/14/18 0319 09/15/18 0721 09/17/18 0759  NA 133* 130* 133*  K 3.7 4.2 3.8  CL 96* 92* 93*  CO2 26 25 28   GLUCOSE 216* 157* 183*  BUN  37* 58* 52*  CREATININE 6.87* 10.27* 10.34*  CALCIUM 8.5* 9.1 9.2   Liver Function Tests: Recent Labs  Lab 09/17/18 0759  AST 13*  ALT 6  ALKPHOS 99  BILITOT 0.5  PROT 7.4  ALBUMIN 2.4*   No results for input(s): LIPASE, AMYLASE in the last 168 hours. CBC: Recent Labs  Lab 09/11/18 0422 09/12/18 0301 09/13/18 0257 09/14/18 0319 09/15/18 0721  WBC 5.9 6.8 6.2 5.7 6.1  NEUTROABS 3.3 4.3 3.2  --   --   HGB 7.4* 8.0* 7.4* 7.3* 7.6*  HCT 24.4* 25.9* 24.3* 23.9* 24.6*  MCV 99.2 99.6 97.2 98.0 95.7  PLT 204 213 201 201 203   Blood Culture    Component Value Date/Time   SDES BLOOD LEFT ANTECUBITAL 08/15/2018 1526   SPECREQUEST  08/15/2018 1526    BOTTLES DRAWN AEROBIC ONLY Blood Culture adequate volume   CULT  08/15/2018 1526    NO GROWTH 5 DAYS Performed at [facility], [place] [address] [place], [place] [address]    REPTSTATUS 08/20/2018 FINAL 08/15/2018 1526    Cardiac Enzymes: No results for input(s): CKTOTAL, CKMB, CKMBINDEX, TROPONINI in the last 168 hours. CBG: Recent Labs  Lab  09/15/18 0744 09/15/18 1628 09/15/18 2346 09/16/18 0807 09/16/18 1153  GLUCAP 139* 225* 171* 142* 226*   Iron Studies: No results for input(s): IRON, TIBC, TRANSFERRIN, FERRITIN in the last 72 hours. Lab Results  Component Value Date   INR 1.3 (H) 09/10/2018   INR 1.2 08/15/2018   Studies/Results: No results found. Medications: . [START ON 09/20/2018] ferric gluconate (FERRLECIT/NULECIT) IV     . amLODipine  10 mg Oral Daily  . brimonidine  1 drop Both Eyes BID  . calcium acetate  1,334 mg Oral TID WC  . chlorhexidine  15 mL Mouth Rinse BID  . Chlorhexidine Gluconate Cloth  6 each Topical Q0600  . [START ON 09/22/2018] darbepoetin (ARANESP) injection - DIALYSIS  200 mcg Intravenous Q Fri-HD  . docusate sodium  100 mg Oral BID  . feeding supplement (NEPRO CARB STEADY)  237 mL Oral Q24H  . feeding supplement (PRO-STAT SUGAR FREE 64)  30 mL Oral BID  . fluticasone  1  spray Each Nare Daily  . gabapentin  100 mg Oral QHS  . heparin  5,000 Units Subcutaneous Q8H  . insulin aspart  0-5 Units Subcutaneous QHS  . insulin aspart  0-9 Units Subcutaneous TID WC  . insulin glargine  5 Units Subcutaneous Daily  . metoCLOPramide  5 mg Oral TID AC  . metoprolol succinate  50 mg Oral Daily  . multivitamin  1 tablet Oral QHS  . mupirocin ointment   Nasal BID  . pantoprazole  40 mg Oral Daily  . timolol  1 drop Both Eyes BID

## 2018-09-18 ENCOUNTER — Inpatient Hospital Stay (HOSPITAL_COMMUNITY): Payer: Medicare Other

## 2018-09-18 ENCOUNTER — Inpatient Hospital Stay (HOSPITAL_COMMUNITY): Payer: Medicare Other | Admitting: Occupational Therapy

## 2018-09-18 DIAGNOSIS — E1142 Type 2 diabetes mellitus with diabetic polyneuropathy: Secondary | ICD-10-CM

## 2018-09-18 DIAGNOSIS — D649 Anemia, unspecified: Secondary | ICD-10-CM

## 2018-09-18 DIAGNOSIS — Z89612 Acquired absence of left leg above knee: Secondary | ICD-10-CM

## 2018-09-18 DIAGNOSIS — E1143 Type 2 diabetes mellitus with diabetic autonomic (poly)neuropathy: Secondary | ICD-10-CM

## 2018-09-18 DIAGNOSIS — R7309 Other abnormal glucose: Secondary | ICD-10-CM

## 2018-09-18 DIAGNOSIS — Z89611 Acquired absence of right leg above knee: Secondary | ICD-10-CM

## 2018-09-18 DIAGNOSIS — K3184 Gastroparesis: Secondary | ICD-10-CM

## 2018-09-18 LAB — CBC
HCT: 23.7 % — ABNORMAL LOW (ref 39.0–52.0)
Hemoglobin: 7.1 g/dL — ABNORMAL LOW (ref 13.0–17.0)
MCH: 28.7 pg (ref 26.0–34.0)
MCHC: 30 g/dL (ref 30.0–36.0)
MCV: 96 fL (ref 80.0–100.0)
Platelets: 235 10*3/uL (ref 150–400)
RBC: 2.47 MIL/uL — ABNORMAL LOW (ref 4.22–5.81)
RDW: 17.5 % — ABNORMAL HIGH (ref 11.5–15.5)
WBC: 7 10*3/uL (ref 4.0–10.5)
nRBC: 0 % (ref 0.0–0.2)

## 2018-09-18 LAB — RENAL FUNCTION PANEL
Albumin: 2.2 g/dL — ABNORMAL LOW (ref 3.5–5.0)
Anion gap: 14 (ref 5–15)
BUN: 70 mg/dL — ABNORMAL HIGH (ref 6–20)
CO2: 24 mmol/L (ref 22–32)
Calcium: 9.2 mg/dL (ref 8.9–10.3)
Chloride: 92 mmol/L — ABNORMAL LOW (ref 98–111)
Creatinine, Ser: 13.05 mg/dL — ABNORMAL HIGH (ref 0.61–1.24)
GFR calc Af Amer: 5 mL/min — ABNORMAL LOW (ref >60)
GFR calc non Af Amer: 4 mL/min — ABNORMAL LOW (ref >60)
Glucose, Bld: 111 mg/dL — ABNORMAL HIGH (ref 70–99)
Phosphorus: 6.2 mg/dL — ABNORMAL HIGH (ref 2.5–4.6)
Potassium: 4.6 mmol/L (ref 3.5–5.1)
Sodium: 130 mmol/L — ABNORMAL LOW (ref 135–145)

## 2018-09-18 LAB — GLUCOSE, CAPILLARY
Glucose-Capillary: 202 mg/dL — ABNORMAL HIGH (ref 70–99)
Glucose-Capillary: 223 mg/dL — ABNORMAL HIGH (ref 70–99)
Glucose-Capillary: 180 mg/dL — ABNORMAL HIGH (ref 70–99)
Glucose-Capillary: 161 mg/dL — ABNORMAL HIGH (ref 70–99)
Glucose-Capillary: 120 mg/dL — ABNORMAL HIGH (ref 70–99)
Glucose-Capillary: 99 mg/dL (ref 70–99)
Glucose-Capillary: 115 mg/dL — ABNORMAL HIGH (ref 70–99)
Glucose-Capillary: 98 mg/dL (ref 70–99)
Glucose-Capillary: 152 mg/dL — ABNORMAL HIGH (ref 70–99)
Glucose-Capillary: 141 mg/dL — ABNORMAL HIGH (ref 70–99)
Glucose-Capillary: 117 mg/dL — ABNORMAL HIGH (ref 70–99)

## 2018-09-18 MED ORDER — LIDOCAINE 4 % EX CREA
TOPICAL_CREAM | Freq: Once | CUTANEOUS | Status: AC
  Administered 2018-09-18: 11:00:00 via TOPICAL
  Filled 2018-09-18: qty 5

## 2018-09-18 MED ORDER — HEPARIN SODIUM (PORCINE) 1000 UNIT/ML DIALYSIS: 4000.0000 [IU] | INTRAMUSCULAR | Status: DC | PRN

## 2018-09-18 NOTE — Evaluation (Signed)
Speech Language Pathology Assessment and Plan  Patient Details  Name: Howard Bell MRN: [id number] Date of Birth: [birth year]  SLP Diagnosis: Cognitive Impairments  Rehab Potential: Fair ELOS: 14-16 days    Today's Date: 09/18/2018 SLP Individual Time: 5726-2035 SLP Individual Time Calculation (min): 45 min   Problem List:  Patient Active Problem List   Diagnosis Date Noted  . S/P AKA (above knee amputation) unilateral, right ([place])   . S/P AKA (above knee amputation) bilateral ([place])   . Acute on chronic anemia   . Diabetic peripheral neuropathy ([place])   . Pressure injury of skin 09/16/2018  . Status post bilateral above knee amputation ([place]) 09/08/2018  . Acute respiratory failure with hypercapnia ([place])   . Atherosclerosis of native arteries of extremities with gangrene, left leg ([place]) 09/05/2018  . Hypoxia   . ESRD (end stage renal disease) ([place])   . Labile blood glucose   . Postoperative pain   . Dry gangrene (HCC)--left foot/left shin   . SIRS (systemic inflammatory response syndrome) (HCC)   . Poorly controlled type 2 diabetes mellitus with peripheral neuropathy ([place])   . Peripheral arterial disease ([place])   . Unilateral AKA, right ([place]) 08/17/2018  . Sepsis ([place]) 08/15/2018  . Acute metabolic encephalopathy 59/74/1638  . Gangrene of left foot ([place])   . Hypoglycemia   . Drug induced constipation   . Confusion, postoperative   . Labile blood pressure   . Uncontrolled diabetes mellitus type 2 with peripheral artery disease ([place])   . Anemia of chronic disease   . Diabetes mellitus type 2 in nonobese (HCC)   . Supplemental oxygen dependent   . Unilateral AKA, left ([place]) 08/07/2018  . S/P BKA (below knee amputation), right ([place]) 08/04/2018  . Severe protein-calorie malnutrition ([place]) 08/01/2018  . Dehiscence of amputation stump ([place]) 08/01/2018  . Cutaneous abscess of left foot 08/01/2018  . Cellulitis of right lower extremity   . Influenza A 05/12/2018  .  Pneumonia 05/12/2018  . Gangrene of right foot ([place])   . Critical limb ischemia with history of revascularization of same extremity 05/09/2018  . LUQ pain   . 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 ([place]) 11/16/2017  . CKD stage 5 due to type 2 diabetes mellitus ([place]) 11/16/2017  . Glaucoma due to type 2 diabetes mellitus ([place]) 11/16/2017  . Chronic generalized pain 11/16/2017  . Recurrent pneumonia 11/06/2017  . Diabetic gastroparesis ([place]) 11/06/2017  . Generalized abdominal pain 09/13/2017  . Acute pain of right shoulder 07/26/2017  . Acute bursitis of right shoulder 07/26/2017  . Left arm swelling 03/28/2017  . ESRD on dialysis ([place]) 03/28/2017  . Chest pain 11/26/2016  . Essential hypertension 11/26/2016  . Anemia due to end stage renal disease ([place]) 08/04/2016  . Acute respiratory failure with hypoxemia ([place]) 08/04/2016  . Type 2 diabetes mellitus with foot ulcer ([place]) 08/04/2016  . Acute CHF (congestive heart failure) ([place]) 08/04/2016  . Elevated troponin    Past Medical History:  Past Medical History:  Diagnosis Date  . Arthritis   . Congestive heart failure (CHF) ([place])   . Depression   . 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 Diabetic gastroparesis ([place]) 11/06/2017  . ESRD (end stage renal disease) on dialysis [facility])    M,W.F dialysis  . H/O seasonal allergies   . Hypertension   .  Pneumonia   . Sleep apnea    not using it now, on continuous O2 2 L during the day, 3 L during the night   Past Surgical History:  Past Surgical History:  Procedure Laterality Date  . AMPUTATION Right 05/14/2018   Procedure: AMPUTATION BELOW KNEE;  Surgeon: Newt Minion, MD;  Location: [facility];  Service: Orthopedics;  Laterality: Right;  . AMPUTATION Right 08/04/2018   Procedure: RIGHT ABOVE KNEE AMPUTATION;  Surgeon: Newt Minion, MD;  Location: [facility];  Service: Orthopedics;  Laterality: Right;  . AMPUTATION Left 09/08/2018   Procedure: LEFT ABOVE KNEE AMPUTATION;  Surgeon: Newt Minion, MD;  Location: [facility];  Service: Orthopedics;  Laterality: Left;  . APPLICATION OF WOUND VAC Left 09/08/2018   Procedure: Application Of Wound Vac;  Surgeon: Newt Minion, MD;  Location: [facility];  Service: Orthopedics;  Laterality: Left;  . ESOPHAGOGASTRODUODENOSCOPY (EGD) WITH PROPOFOL N/A 12/05/2017   Procedure: ESOPHAGOGASTRODUODENOSCOPY (EGD) WITH PROPOFOL;  Surgeon: Doran Stabler, MD;  Location: [facility];  Service: Gastroenterology;  Laterality: N/A;  . HERNIA REPAIR    . IR AV DIALY SHUNT INTRO NEEDLE/INTRACATH INITIAL W/PTA/IMG LEFT  03/30/2017    Assessment / Plan / Recommendation Clinical Impression Pt is a 53 y/o male with PMH of ESRD (HD MWF), DM type 2, HTN, prior CVA and OSA. Pt had a R BKA in February of 2020 and was receiving therapy at [facility]. Per wife, pt had fallen multiple times at [facility]. F/U with Dr. Sharol Given on 5/12, staples still present, pt not wearing shrinker, new dehiscence of R residual limb, also noted for new ischemic ulceration to posterior aspect of the L ankle and superficial abscess of dorsal lateral aspect of L foot. Return to [facility] for R BKA to AKA revision on 08/04/2018 with Dr. Sharol Given with wound vac application for 1 week. Pt was admitted to CIR on 08/07/2018 and returned to acute on 5/26 for AMS related to septic gangrene infection to distal portion of L calf. Dr. Sharol Given consulted and felt no surgical intervention necessary at that time. Pt cognition returned to baseline and he returned to CIR on 5/28. He was discharged from CIR on 6/10 at supervision/min guard level with wife able to provide needed assist. He returned to Dr. 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 He had difficulty weaning from vent following  surgery and was admitted to cardiac ICU for weaning managed by critical care. He was extubated on 09/09/2018. Hospital course pain management. Pt evaluated by therapy and recommendations were made for CIR.   Pt presents with severe-moderate cognitive linguistic impairment, deficits include basic problem solving, emergent awareness, immediate/short term recall and orientation, largely impacted by reduced sustained attention, fatigue and delayed processing. Pt expressed fatigue following back to back treatment sessions and required verbal cues to remain alert in 1-3 minute intervals, closing eyes majority of session. Pt demonstrated ability to answer complex yes/no questions in 7 out 10 opportunities , follow 1 step commands in 3 out 3 opportunities, follow 2 step commands in 1 out 3 opportunities and awareness of delayed processing "trouble thinking." Formal cognitive linguistic assessment, MOCA version 7.1, pt scored 12 out 30 (n=>26) with deficits in problem solving, emergent awareness, immediate/delayed recall, sustained attention, language (suggest due to reduced sustained attention) and orientation for day/date only. Per chart review pt demonstrated increased sustained  attention, recall and problem solving with PT/OT, therefore if attention improves during inpatient stay goals will be upgraded. Pt presents with no language or speech deficits, with ability to express wants/needs limited only by reduced sustained attention and fatigue. Pt would benefit from skilled ST services in order to maximize functional independence and reduce burden of care, requiring 24 hour supervision and continue ST services.   Skilled Therapeutic Interventions          Skilled ST services focused on cognitive skills. SLP administered  cognitive linguistic assessment, educated pt on results and created plan to address deficits. All questions were answered to satisfaction. Pt was left in room with call bell within reach and chair  alarm set. ST recommends to continue skilled ST services.  SLP Assessment  Patient will need skilled Speech Lanaguage Pathology Services during CIR admission    Recommendations  SLP Diet Recommendations: Thin Liquid Administration via: Cup;Straw Medication Administration: Whole meds with liquid Supervision: Patient able to self feed(only when alert) Compensations: Minimize environmental distractions Postural Changes and/or Swallow Maneuvers: Seated upright 90 degrees Oral Care Recommendations: Oral care BID Recommendations for Other Services: Neuropsych consult Patient destination: Home Follow up Recommendations: 24 hour supervision/assistance;Home Health SLP Equipment Recommended: None recommended by SLP    SLP Frequency 3 to 5 out of 7 days   SLP Duration  SLP Intensity  SLP Treatment/Interventions 14-16 days  Minumum of 1-2 x/day, 30 to 90 minutes  Cognitive remediation/compensation;Cueing hierarchy;Functional tasks;Patient/family education;Therapeutic Activities;Environmental controls;Internal/external aids    Pain Pain Assessment Pain Scale: 0-10 Pain Score: 0-No pain Pain Type: Surgical pain Pain Location: Leg Pain Orientation: Left Pain Descriptors / Indicators: Aching Pain Onset: On-going Pain Intervention(s): RN made aware  Prior Functioning Cognitive/Linguistic Baseline: Information not available Type of Home: Apartment  Lives With: Spouse Available Help at Discharge: Family;Available 24 hours/day Education: 4 year college Vocation: On disability  Short Term Goals: Week 1: SLP Short Term Goal 1 (Week 1): Pt will demonstrate sustained attention for 5 minute intervals during functional tasks with mod A verbal cues for redirection. SLP Short Term Goal 2 (Week 1): Pt will demonstrate functional problem solving for basic and familiar tasks with min A verbal cues. SLP Short Term Goal 3 (Week 1): Pt will self-monitor and self-correct functional errors with max A  verbal cues. SLP Short Term Goal 4 (Week 1): Pt will utilize external memory aids to recall new, daily information with mod A verbal cues. SLP Short Term Goal 5 (Week 1): Pt will demonstrate orientation x4 with supervision A verbal cues.  Refer to Care Plan for Long Term Goals  Recommendations for other services: None   Discharge Criteria: Patient will be discharged from SLP if patient refuses treatment 3 consecutive times without medical reason, if treatment goals not met, if there is a change in medical status, if patient makes no progress towards goals or if patient is discharged from hospital.  The above assessment, treatment plan, treatment alternatives and goals were discussed and mutually agreed upon: by patient  Khai Arrona  [facility] 09/18/2018, 3:26 PM

## 2018-09-18 NOTE — Progress Notes (Signed)
[facility] Progress Note   Subjective: Sleeping, lethargic when aroused. Oriented to person and place. No C/Os. HD today on schedule.   Objective Vitals:   09/17/18 0048 09/17/18 0611 09/17/18 1946 09/18/18 0630  BP:  126/88 105/60 122/63  Pulse: 84 89 95 88  Resp: 18 17 18 18   Temp:  98.2 F (36.8 C) 98.2 F (36.8 C) 98.1 F (36.7 C)  TempSrc:  Oral Oral Oral  SpO2: 98% 93% 94% 92%  Weight:  91.3 kg  92 kg  Height:       Physical Exam General: Chronically ill appearing male in NAD Heart: S1,S2 RRR Lungs: CTAB A/P Abdomen: active BS Extremities: L AKA with wound vac. R AKA suture line approximated without drainage. No stump edema Dialysis Access: L AVG + bruit  Dialysis Orders:  Additional Objective Labs: Basic Metabolic Panel: Recent Labs  Lab 09/14/18 0319 09/15/18 0721 09/17/18 0759  NA 133* 130* 133*  K 3.7 4.2 3.8  CL 96* 92* 93*  CO2 26 25 28   GLUCOSE 216* 157* 183*  BUN 37* 58* 52*  CREATININE 6.87* 10.27* 10.34*  CALCIUM 8.5* 9.1 9.2   Liver Function Tests: Recent Labs  Lab 09/17/18 0759  AST 13*  ALT 6  ALKPHOS 99  BILITOT 0.5  PROT 7.4  ALBUMIN 2.4*   No results for input(s): LIPASE, AMYLASE in the last 168 hours. CBC: Recent Labs  Lab 09/12/18 0301 09/13/18 0257 09/14/18 0319 09/15/18 0721  WBC 6.8 6.2 5.7 6.1  NEUTROABS 4.3 3.2  --   --   HGB 8.0* 7.4* 7.3* 7.6*  HCT 25.9* 24.3* 23.9* 24.6*  MCV 99.6 97.2 98.0 95.7  PLT 213 201 201 203   Blood Culture    Component Value Date/Time   SDES BLOOD LEFT ANTECUBITAL 08/15/2018 1526   SPECREQUEST  08/15/2018 1526    BOTTLES DRAWN AEROBIC ONLY Blood Culture adequate volume   CULT  08/15/2018 1526    NO GROWTH 5 DAYS Performed at [facility] [address] [place], [place] [address]    REPTSTATUS 08/20/2018 FINAL 08/15/2018 1526    Cardiac Enzymes: No results for input(s): CKTOTAL, CKMB, CKMBINDEX, TROPONINI in the last 168 hours. CBG: Recent Labs   Lab 09/17/18 1146 09/17/18 1712 09/17/18 2109 09/18/18 0637 09/18/18 1149  GLUCAP 120* 99 115* 98 152*   Iron Studies: No results for input(s): IRON, TIBC, TRANSFERRIN, FERRITIN in the last 72 hours. @lablastinr3 @ Studies/Results: No results found. Medications: . [START ON 09/20/2018] ferric gluconate (FERRLECIT/NULECIT) IV     . amLODipine  10 mg Oral Daily  . brimonidine  1 drop Both Eyes BID  . calcium acetate  1,334 mg Oral TID WC  . chlorhexidine  15 mL Mouth Rinse BID  . Chlorhexidine Gluconate Cloth  6 each Topical Q0600  . [START ON 09/22/2018] darbepoetin (ARANESP) injection - DIALYSIS  200 mcg Intravenous Q Fri-HD  . docusate sodium  100 mg Oral BID  . feeding supplement (NEPRO CARB STEADY)  237 mL Oral Q24H  . feeding supplement (PRO-STAT SUGAR FREE 64)  30 mL Oral BID  . fluticasone  1 spray Each Nare Daily  . gabapentin  100 mg Oral QHS  . heparin  5,000 Units Subcutaneous Q8H  . insulin aspart  0-5 Units Subcutaneous QHS  . insulin aspart  0-9 Units Subcutaneous TID WC  . metoCLOPramide  5 mg Oral TID AC  . metoprolol succinate  50 mg Oral Daily  . multivitamin  1  tablet Oral QHS  . mupirocin ointment   Nasal BID  . pantoprazole  40 mg Oral Daily  . timolol  1 drop Both Eyes BID   Dialysis Orders: AF MWF  4h 43min 450/800 93kg 2/2.25 bath Hep 9000 L AVG - parsabiv 10, iPTH 640 6/17  - Hb 8 at dc 6/10 on home O2 - Mircera 200 given 6/16 - tsat after last hosp d/c 37% - had been offParsabiv during recent hospitalization   Assessment/Plan: 1. Debility: on CIR now 2. 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 Then went to CIR, then came back for AMS. Then went back to CIR. Then underwent L AKA 09/08/18 for multiple wounds not responding to Rx. Now is bilat AKA.  3. ESRD- MWF HD.Next HD today 4. Hypertension/volume- down 2kg, should be lower after bilat AKA 5. Anemia-hgb 7.6  trending up - some post op loss received 200 Mircera 6/16; Resumed 200 Aranesp q Friday..tsat 37% 6/17 - added weekly Fe  6. Metabolic bone disease- Holding parsabiv while here due to unavailability; resuming binders now as eating (phoslo) - Ca a little high without parsabiv - 7. Nutrition- chronic low alb./ prostat ordered 8. DM- per primary  Rita H. Brown NP-C 09/18/2018, 12:46 PM  [employer] [phone]

## 2018-09-18 NOTE — Progress Notes (Signed)
Patient information reviewed and entered into eRehab System by Becky Tashi Band, PPS coordinator. Information including medical coding, function ability, and quality indicators will be reviewed and updated through discharge.   

## 2018-09-18 NOTE — Progress Notes (Signed)
Occupational Therapy Session Note  Patient Details  Name: Howard Bell MRN: [id number] Date of Birth: Jan 09, 1966  Today's Date: 09/18/2018 OT Individual Time: 1050-1150 OT Individual Time Calculation (min): 60 min  and Today's Date: 09/18/2018 OT Missed Time: 15 Minutes Missed Time Reason: Patient fatigue   Short Term Goals: Week 1:  OT Short Term Goal 1 (Week 1): Pt will don shorts with mod A using lateral leans OT Short Term Goal 2 (Week 1): Pt will complete transfer to [facility] with mod A OT Short Term Goal 3 (Week 1): Pt will require no more than CGA for EOB sitting balance  Skilled Therapeutic Interventions/Progress Updates:    Pt received in w/c sleeping. He was able to wake easily but frequently dozed off during the session even with active exercise.   Pt had cream on his R residual limb to prepare for suture removal.  To avoid removing cream and getting on therapy mat, worked from w/c on UE exercises using a weighted med ball and weighted dowel.  Nursing needed to remove staples before lunch so had pt use slide board to bed.  Pt is familiar with the board but needed A to place.  Pt completed transfer to his L onto bed with extra time, very close guarding and support to keep board stedy but no lifting A needed.  Once on bed, he was able to pivot around to supine with HOB elevated.   Attempted to work on UE from bed but pt kept closing his eyes and saying he was too sleepy.  Nursing removed staples and pt fell asleep during procedure.  Pt in bed with alarm set and all needs met.     Therapy Documentation Precautions:  Precautions Precautions: Fall Precaution Comments: wound VAC surgical incision. hx of Right AKA & L AKA. 2L oxygen Restrictions Weight Bearing Restrictions: Yes RLE Weight Bearing: Non weight bearing LLE Weight Bearing: Non weight bearing      Pain: Pain Assessment Pain Scale: 0-10 Pain Score: 8  Pain Type: Surgical pain Pain Location: Leg Pain  Orientation: Left Pain Descriptors / Indicators: Aching Pain Onset: On-going Pain Intervention(s): RN made aware      Therapy/Group: Individual Therapy  [place] 09/18/2018, 11:38 AM

## 2018-09-18 NOTE — Progress Notes (Signed)
[facility] PHYSICAL MEDICINE & REHABILITATION PROGRESS NOTE   Subjective/Complaints: Patient seen sitting up in bed this morning.  He states he slept well overnight.  Discussed with PA sutures/staples from previous AKA.  ROS: Denies CP, shortness of breath, nausea, vomiting, diarrhea.   Objective:   No results found. No results for input(s): WBC, HGB, HCT, PLT in the last 72 hours. Recent Labs    09/17/18 0759  NA 133*  K 3.8  CL 93*  CO2 28  GLUCOSE 183*  BUN 52*  CREATININE 10.34*  CALCIUM 9.2    Intake/Output Summary (Last 24 hours) at 09/18/2018 0912 Last data filed at 09/18/2018 0830 Gross per 24 hour  Intake 340 ml  Output -  Net 340 ml     Physical Exam: Vital Signs Blood pressure 122/63, pulse 88, temperature 98.1 F (36.7 C), temperature source Oral, resp. rate 18, height 6' (1.829 m), weight 92 kg, SpO2 92 %. Constitutional: No distress . Vital signs reviewed. HENT: Normocephalic.  Atraumatic. Eyes: EOMI. No discharge. Cardiovascular: No JVD. Respiratory: Normal effort. GI: Non-distended. Musc: Right AKA with improving edema and tenderness Left AKA with tenderness Neurological: He is alertand oriented  Motor: Bilateral UE 5/5 proximal to distal.  RLE 4+/5 hip flexion LLE: 2/5 hip flexion Skin: Vac L AKA Right AKA with staples/sutures Psychiatric: Flat  Assessment/Plan: 1. Functional deficits secondary to left AKA which require 3+ hours per day of interdisciplinary therapy in a comprehensive inpatient rehab setting.  Physiatrist is providing close team supervision and 24 hour management of active medical problems listed below.  Physiatrist and rehab team continue to assess barriers to discharge/monitor patient progress toward functional and medical goals  Care Tool:  Bathing    Body parts bathed by patient: Right arm, Left arm, Chest, Abdomen, Face, Front perineal area, Right upper leg, Left upper leg   Body parts bathed by helper:  Buttocks Body parts n/a: Right lower leg, Left lower leg   Bathing assist Assist Level: Moderate Assistance - Patient 50 - 74%     Upper Body Dressing/Undressing Upper body dressing   What is the patient wearing?: Pull over shirt    Upper body assist Assist Level: Set up assist    Lower Body Dressing/Undressing Lower body dressing      What is the patient wearing?: Pants, Underwear/pull up     Lower body assist Assist for lower body dressing: Maximal Assistance - Patient 25 - 49%     Toileting Toileting Toileting Activity did not occur (Clothing management and hygiene only): Safety/medical concerns  Toileting assist Assist for toileting: Moderate Assistance - Patient 50 - 74%     Transfers Chair/bed transfer  Transfers assist  Chair/bed transfer activity did not occur: Safety/medical concerns  Chair/bed transfer assist level: Maximal Assistance - Patient 25 - 49% Chair/bed transfer assistive device: Sliding board   Locomotion Ambulation   Ambulation assist   Ambulation activity did not occur: N/A          Walk 10 feet activity   Assist  Walk 10 feet activity did not occur: N/A        Walk 50 feet activity   Assist           Walk 150 feet activity   Assist Walk 150 feet activity did not occur: N/A         Walk 10 feet on uneven surface  activity   Assist Walk 10 feet on uneven surfaces activity did not occur: N/A  Wheelchair     Assist Will patient use wheelchair at discharge?: Yes Type of Wheelchair: Manual    Wheelchair assist level: Supervision/Verbal cueing Max wheelchair distance: 150'    Wheelchair 50 feet with 2 turns activity    Assist        Assist Level: Supervision/Verbal cueing   Wheelchair 150 feet activity     Assist     Assist Level: Supervision/Verbal cueing    Medical Problem List and Plan: 1.Functional deficitssecondary to PAD, new left AKA (09/08/2018), recent right  AKA (08/04/2018) Continue CIR  Notes reviewed- previous AKA, now with nonhealing left lower extremity wounds status post AKA 2. Antithrombotics: -DVT/anticoagulation:Pharmaceutical:Heparin -antiplatelet therapy: N/A 3. Pain Management:Oxycodone prn  Added gabapentin for phantom pain LLE, 100mg  qhs 4. Mood:LCSW to follow for evaluation and support. -antipsychotic agents: N/A 5. Neuropsych: This patientis not fullycapable of making decisions on hisown behalf. 6. Skin/Wound Care:  Plan to DC back tomorrow to left AKA due to drainage Will discuss with surgery regarding removal of sutures/staples to right AKA 7. Fluids/Electrolytes/Nutrition:Strict I/O. Daily weights. Labs with HD. 8. OSA/Chronic respiratory failure: Oxygen dependent--2 L during the day and 3 liters at nights. 9. HTN: Monitor BP TID--On Norvasc and metoprolol daily  Controlled on 6/29 10. T2DM with gastroparesis and neuropathy: Brittle diabetic. Continue SSI as at home.   Reglan tid ac for gastroparesis.   Lantus start over the weekend, but [place] on 6/30 due to insulin sensitivity and previous hypoglycemic episodes, will need to monitor closely.  Discussed with nursing increasing CBG checks. 11. Glaucoma with poor vision: On Timoptic at bedtime and Alphagan bid 12. Acute on chronic anemia: On Aranesp weekly.  Hemoglobin 7.6 on 6/26  Continue to monitor 13. ESRD: HD MWF.  -HD after therapy day to allow participation in rehab activities  -nephrology following, appreciate Recs    LOS: 2 days A FACE TO FACE EVALUATION WAS PERFORMED   Lorie Phenix 09/18/2018, 9:12 AM

## 2018-09-18 NOTE — Progress Notes (Addendum)
Physical Therapy Session Note  Patient Details  Name: Howard Bell MRN: [id number] Date of Birth: [birth year]  Today's Date: 09/18/2018 PT Individual Time: 0815-0910 PT Individual Time Calculation (min): 55 min   Short Term Goals: Week 1:  PT Short Term Goal 1 (Week 1): Patient to demonstrate dynamic sitting balance with S.  PT Short Term Goal 2 (Week 1): pt will perform bed<>chair transfers with slideboard or A-P with min assist PT Short Term Goal 3 (Week 1): Pt will perform car transfer with min assist  Skilled Therapeutic Interventions/Progress Updates:   Pt sitting up in bed, finishing breakfast.  He was able to state the date.  Affect is flat, but he eventually responded to questions about himself.  He denied pain. He had pulled off his [place] to eat, he stated.  O2 sats 95%, HR 94%.  replaced; pt on 2 L O2/min.  Using bed features, slide board transfer to L to w/c, min assist after board placed by PT.  Min cues for technique.  Pt resistant to suggestions by PT.  W/c propulsion to/from gym, supervision, x 120' x 2.  Pt resistant to suggestions from PT for improved efficiency.  Seated Therapeutic exercises performed with LEs to increase strength for functional mobility. 4 x 1 bil adductor squeezes, 7 x 1 bil glut sets.  Pt declined further exs due to LLE pain.  Therapeutic activity, seated in w/c, using 4# weighted bar to tap beach ball, x 3 minutes with breaks.  Pt with poor problem- solving for hitting ball to prevent it from going behind him.  Pt lethargic,  dozing in between activities.   At end of session, pt seated in w/c with needs at hand and seat belt alarm set.       Therapy Documentation Precautions:  Precautions Precautions: Fall Precaution Comments: wound VAC surgical incision. hx of Right AKA & L AKA. 2L oxygen Restrictions Weight Bearing Restrictions: Yes RLE Weight Bearing: Non weight bearing LLE Weight Bearing: Non weight bearing General: PT Amount of  Missed Time (min): 5 Minutes PT Missed Treatment Reason: Other (Comment);Patient unwilling to participate(eating breakfast)   Pain: denies, at rest      Therapy/Group: Individual Therapy  Demira Gwynne 09/18/2018, 10:33 AM

## 2018-09-18 NOTE — Progress Notes (Signed)
RT at bedside 0010. Patient states that he will place self on NIV once he is ready for bed. Patient does not want to go on machine at this time. Patient is unaware what time he wants to go to sleep., instructed to notify staff to let RT know if he has any issues or if assistance is needed. Patient verbalize understanding

## 2018-09-19 ENCOUNTER — Inpatient Hospital Stay (HOSPITAL_COMMUNITY): Payer: Medicare Other | Admitting: Speech Pathology

## 2018-09-19 ENCOUNTER — Inpatient Hospital Stay (HOSPITAL_COMMUNITY): Payer: Medicare Other

## 2018-09-19 LAB — GLUCOSE, CAPILLARY
Glucose-Capillary: 115 mg/dL — ABNORMAL HIGH (ref 70–99)
Glucose-Capillary: 97 mg/dL (ref 70–99)
Glucose-Capillary: 115 mg/dL — ABNORMAL HIGH (ref 70–99)
Glucose-Capillary: 135 mg/dL — ABNORMAL HIGH (ref 70–99)
Glucose-Capillary: 206 mg/dL — ABNORMAL HIGH (ref 70–99)
Glucose-Capillary: 177 mg/dL — ABNORMAL HIGH (ref 70–99)
Glucose-Capillary: 127 mg/dL — ABNORMAL HIGH (ref 70–99)
Glucose-Capillary: 115 mg/dL — ABNORMAL HIGH (ref 70–99)
Glucose-Capillary: 131 mg/dL — ABNORMAL HIGH (ref 70–99)
Glucose-Capillary: 140 mg/dL — ABNORMAL HIGH (ref 70–99)
Glucose-Capillary: 163 mg/dL — ABNORMAL HIGH (ref 70–99)
Glucose-Capillary: 143 mg/dL — ABNORMAL HIGH (ref 70–99)
Glucose-Capillary: 155 mg/dL — ABNORMAL HIGH (ref 70–99)
Glucose-Capillary: 132 mg/dL — ABNORMAL HIGH (ref 70–99)

## 2018-09-19 MED ORDER — TRAMADOL HCL 50 MG PO TABS: 50.0000 mg | ORAL_TABLET | Freq: Four times a day (QID) | ORAL | Status: DC | PRN

## 2018-09-19 MED ORDER — CHLORHEXIDINE GLUCONATE CLOTH 2 % EX PADS: 6.0000 | MEDICATED_PAD | Freq: Every day | CUTANEOUS | Status: DC

## 2018-09-19 MED ORDER — FERRIC CITRATE 1 GM 210 MG(FE) PO TABS
210.0000 mg | ORAL_TABLET | Freq: Three times a day (TID) | ORAL | Status: DC
  Administered 2018-09-19 – 2018-09-29 (×27): 210 mg via ORAL
  Filled 2018-09-19 (×31): qty 1

## 2018-09-19 MED ORDER — TRAMADOL HCL 50 MG PO TABS
50.0000 mg | ORAL_TABLET | Freq: Two times a day (BID) | ORAL | Status: DC | PRN
  Administered 2018-09-28: 50 mg via ORAL
  Filled 2018-09-19: qty 1

## 2018-09-19 MED ORDER — OXYCODONE HCL 5 MG PO TABS
5.0000 mg | ORAL_TABLET | ORAL | Status: DC | PRN
  Administered 2018-09-21: 10 mg via ORAL
  Administered 2018-09-21: 5 mg via ORAL
  Administered 2018-09-22 – 2018-09-28 (×7): 10 mg via ORAL
  Filled 2018-09-19 (×2): qty 2
  Filled 2018-09-19: qty 1
  Filled 2018-09-19 (×5): qty 2

## 2018-09-19 MED ORDER — DARBEPOETIN ALFA 200 MCG/0.4ML IJ SOSY: 200.0000 ug | PREFILLED_SYRINGE | INTRAMUSCULAR | Status: DC

## 2018-09-19 MED ORDER — SODIUM CHLORIDE 0.9% IV SOLUTION: Freq: Once | INTRAVENOUS | Status: DC

## 2018-09-19 MED ORDER — DARBEPOETIN ALFA 200 MCG/0.4ML IJ SOSY
200.0000 ug | PREFILLED_SYRINGE | INTRAMUSCULAR | Status: DC
  Administered 2018-09-22 – 2018-09-29 (×2): 200 ug via INTRAVENOUS
  Filled 2018-09-19 (×2): qty 0.4

## 2018-09-19 NOTE — Progress Notes (Signed)
Occupational Therapy Session Note  Patient Details  Name: Howard Bell MRN: [id number] Date of Birth: 10/10/1965  Today's Date: 09/19/2018 OT Individual Time: 1230-1330 OT Individual Time Calculation (min): 60 min    Short Term Goals: Week 1:  OT Short Term Goal 1 (Week 1): Pt will don shorts with mod A using lateral leans OT Short Term Goal 2 (Week 1): Pt will complete transfer to [facility] with mod A OT Short Term Goal 3 (Week 1): Pt will require no more than CGA for EOB sitting balance  Skilled Therapeutic Interventions/Progress Updates:    Pt sitting in w/c upon arrival eating lunch.  OT intervention with focus on discharge planning, activity tolerance, w/c mobility, equipment needs.  Pt c/o increased pain in LLE since removal of wound vac.  Pt stated he is unable to access bathroom at home but has been bathing seated EOB or in w/c.  Pt uses slide board for tranfsers to bed/w/c and drop arm BSC. Pt remained seated in w/c with belt alarm activated and all needs within reach.   Therapy Documentation Precautions:  Precautions Precautions: Fall Precaution Comments: wound VAC surgical incision. hx of Right AKA & L AKA. 2L oxygen Restrictions Weight Bearing Restrictions: Yes RLE Weight Bearing: Non weight bearing LLE Weight Bearing: Non weight bearing Pain:  Pt c/o increased pain in LLE since wound vac removed this morning; repositioned   Therapy/Group: Individual Therapy  Leroy Libman 09/19/2018, 2:36 PM

## 2018-09-19 NOTE — Progress Notes (Signed)
Physical Therapy Session Note  Patient Details  Name: Howard Bell MRN: [id number] Date of Birth: 18-Aug-1965  Today's Date: 09/19/2018 PT Individual Time: 0800-0900 PT Individual Time Calculation (min): 60 min   Short Term Goals: Week 1:  PT Short Term Goal 1 (Week 1): Patient to demonstrate dynamic sitting balance with S.  PT Short Term Goal 2 (Week 1): pt will perform bed<>chair transfers with slideboard or A-P with min assist PT Short Term Goal 3 (Week 1): Pt will perform car transfer with min assist  Skilled Therapeutic Interventions/Progress Updates:     Patient in bed upon PT arrival. Patient alert and agreeable to PT session. Patient with wound vac on L residual limb, MD rounded during session and stated wound vac should be removed today. Patient reported 7/10 B residual limb pain with phantom pain and surgical pain. PT educated on phantom pain and provided desensitization and relaxation techniques with mild improvement of pain. Also provided repositioning and distraction as pain interventions throughout session. PT confirmed with MD about wrapping R residual limb, then wrapped the R limb with 1 4" and 1 6" ACE wrap. Educated patient on wrapping technique and edema control/shaping of residual limb.  Therapeutic Activity: Bed Mobility: Patient performed supine to sit with mod A with bed slightly elevated due to pain and use of bed rails. Provided verbal cues for pushing up onto elbows to push through UEs to sit. Patient sat EOB to drink coffee with supervision for 3 minutes.  Transfers: Patient performed a slide board transfer with min A for physical support and max A for set up. Provided verbal cues for w/c positioning, board and hand placement, and head-hips relationship. Patient requires increased time and explanation to perform transfer.   Therapeutic Exercise: Patient performed the following exercises with verbal and tactile cues for proper technique. -B SLR x10 -B isometric  hip adductor squeeze with pillow x10 with 5 sec hold -B glut sets x10 with 5 second hold Educated on lying flat in supine as much as possible to maintain hip extension ROM. Patient reports he has not been able to lie in prone position for a long time.   Patient in w/c eating breakfast at end of session with breaks locked, seat belt alarm set, and all needs within reach.    Therapy Documentation Precautions:  Precautions Precautions: Fall Precaution Comments: wound VAC surgical incision. hx of Right AKA & L AKA. 2L oxygen Restrictions Weight Bearing Restrictions: Yes RLE Weight Bearing: Non weight bearing LLE Weight Bearing: Non weight bearing Vital Signs: Therapy Vitals Pulse Rate: 81 BP: 122/76 Patient Position (if appropriate): Lying Oxygen Therapy SpO2: 92 % O2 Device: Nasal Cannula O2 Flow Rate (L/min): 2 L/min Pain: Pain Assessment Pain Scale: 0-10 Pain Score: 7  Pain Type: Acute pain;Surgical pain;Phantom pain Pain Location: Leg Pain Orientation: Right;Left Pain Descriptors / Indicators: Aching;Discomfort;Sore Pain Frequency: Constant Pain Onset: On-going Pain Intervention(s): Repositioned;Distraction;Relaxation   Therapy/Group: Individual Therapy  Alixandrea Milleson L Caterina Racine PT, DPT  09/19/2018, 4:40 PM

## 2018-09-19 NOTE — Progress Notes (Signed)
[facility] Progress Note   Subjective: Up in wheel chair, more alert and talkative. Looks much better. No C/Os.    Objective Vitals:   09/18/18 2330 09/19/18 0000 09/19/18 0411 09/19/18 0809  BP: 98/66 129/65 (!) 95/51 122/76  Pulse: 92 92 (!) 114 81  Resp:   18   Temp:   99.5 F (37.5 C)   TempSrc:   Oral   SpO2:  96% 92%   Weight:  89.3 kg    Height:       Physical Exam General: Chronically ill appearing male in NAD Heart: S1,S2 RRR Lungs: CTAB A/P Abdomen: active BS Extremities: L AKA with ace wrap. R AKA suture line approximated without drainage. No stump edema Dialysis Access: L AVG + bruit    Additional Objective Labs: Basic Metabolic Panel: Recent Labs  Lab 09/15/18 0721 09/17/18 0759 09/18/18 2129  NA 130* 133* 130*  K 4.2 3.8 4.6  CL 92* 93* 92*  CO2 25 28 24   GLUCOSE 157* 183* 111*  BUN 58* 52* 70*  CREATININE 10.27* 10.34* 13.05*  CALCIUM 9.1 9.2 9.2  PHOS  --   --  6.2*   Liver Function Tests: Recent Labs  Lab 09/17/18 0759 09/18/18 2129  AST 13*  --   ALT 6  --   ALKPHOS 99  --   BILITOT 0.5  --   PROT 7.4  --   ALBUMIN 2.4* 2.2*   No results for input(s): LIPASE, AMYLASE in the last 168 hours. CBC: Recent Labs  Lab 09/13/18 0257 09/14/18 0319 09/15/18 0721 09/18/18 2129  WBC 6.2 5.7 6.1 7.0  NEUTROABS 3.2  --   --   --   HGB 7.4* 7.3* 7.6* 7.1*  HCT 24.3* 23.9* 24.6* 23.7*  MCV 97.2 98.0 95.7 96.0  PLT 201 201 203 235   Blood Culture    Component Value Date/Time   SDES BLOOD LEFT ANTECUBITAL 08/15/2018 1526   SPECREQUEST  08/15/2018 1526    BOTTLES DRAWN AEROBIC ONLY Blood Culture adequate volume   CULT  08/15/2018 1526    NO GROWTH 5 DAYS Performed at [facility] [address] [place], [place] [address]    REPTSTATUS 08/20/2018 FINAL 08/15/2018 1526    Cardiac Enzymes: No results for input(s): CKTOTAL, CKMB, CKMBINDEX, TROPONINI in the last 168 hours. CBG: Recent Labs  Lab  09/19/18 0358 09/19/18 0610 09/19/18 0804 09/19/18 1035 09/19/18 1158  GLUCAP 206* 177* 127* 115* 131*   Iron Studies: No results for input(s): IRON, TIBC, TRANSFERRIN, FERRITIN in the last 72 hours. @lablastinr3 @ Studies/Results: No results found. Medications: . [START ON 09/20/2018] ferric gluconate (FERRLECIT/NULECIT) IV     . amLODipine  10 mg Oral Daily  . brimonidine  1 drop Both Eyes BID  . calcium acetate  1,334 mg Oral TID WC  . chlorhexidine  15 mL Mouth Rinse BID  . Chlorhexidine Gluconate Cloth  6 each Topical Q0600  . [START ON 09/22/2018] darbepoetin (ARANESP) injection - DIALYSIS  200 mcg Intravenous Q Fri-HD  . docusate sodium  100 mg Oral BID  . feeding supplement (NEPRO CARB STEADY)  237 mL Oral Q24H  . feeding supplement (PRO-STAT SUGAR FREE 64)  30 mL Oral BID  . fluticasone  1 spray Each Nare Daily  . gabapentin  100 mg Oral QHS  . heparin  5,000 Units Subcutaneous Q8H  . insulin aspart  0-5 Units Subcutaneous QHS  . insulin aspart  0-9 Units Subcutaneous TID WC  .  metoCLOPramide  5 mg Oral TID AC  . metoprolol succinate  50 mg Oral Daily  . multivitamin  1 tablet Oral QHS  . mupirocin ointment   Nasal BID  . pantoprazole  40 mg Oral Daily  . timolol  1 drop Both Eyes BID     Dialysis Orders: [facility] MWF  4h 82min 450/800 93kg 2/2.25 bath Hep 9000 L AVG - parsabiv 10, iPTH 640 6/17  - Hb 8 at dc 6/10 on home O2 - Mircera 200 given 6/16 - tsat after last hosp d/c 37% - had been offParsabiv during recent hospitalization   Assessment/Plan: 1. Debility: on CIR now 2. 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 Then went to CIR, then came back for AMS. Then went back to CIR. Then underwent L AKA 09/08/18 for multiple wounds not responding to Rx. Now is bilat AKA.  3. ESRD- MWF HD.Next HD today 4. Hypertension/volume- HD yesterday on schedule. Pre wt 92.1 kg Net UF 2.5 Post wt  89.3 kg. Lower EDW on DC. No evidence of volume overload by exam. BP on soft side. Monitor.  5. Anemia-HGB down to 7.1. 1 unit of PRBCs on HD tomorrow. No ESA  In over 1 week. Give tomorrow.  6. Metabolic bone disease- Holding parsabiv while here due to unavailability; Corrected Ca 10.6 Stop Ca Acetate, start auryxia 210 mg PO TIW. Phos 6.2 7. Nutrition- chronic low alb./ prostat ordered 8. DM- per primary  Rita H. Brown NP-C 09/19/2018, 12:36 PM  [employer] [phone]

## 2018-09-19 NOTE — Progress Notes (Signed)
Occupational Therapy Session Note  Patient Details  Name: Howard Bell MRN: [id number] Date of Birth: 01-17-1966  Today's Date: 09/19/2018 OT Individual Time: 1600-1630 OT Individual Time Calculation (min): 30 min    Short Term Goals: Week 1:  OT Short Term Goal 1 (Week 1): Pt will don shorts with mod A using lateral leans OT Short Term Goal 2 (Week 1): Pt will complete transfer to [facility] with mod A OT Short Term Goal 3 (Week 1): Pt will require no more than CGA for EOB sitting balance  Skilled Therapeutic Interventions/Progress Updates:    Session focused on ADL transfers and dynamic sitting balance. Pt completed lateral scoot transfer to bed stating he "didn't need the slideboard" and OT allowed pt to attempt, however pt required mod A to correct transfer when he misjudged the gap between w/c and bed. Pt required max A to come back into sitting EOB. Pt completed BUE coordination task, throwing/catching ball, to challenge trunk stability/strength. Pt also completed core stabilization exercises, extending trunk and coming into upright sitting with functional reach component. Pt transferred back to w/c with slideboard, requiring mod A. Pt was left sitting up with chair alarm belt fastened.   Therapy Documentation Precautions:  Precautions Precautions: Fall Precaution Comments: wound VAC surgical incision. hx of Right AKA & L AKA. 2L oxygen Restrictions Weight Bearing Restrictions: Yes RLE Weight Bearing: Non weight bearing LLE Weight Bearing: Non weight bearing   Therapy/Group: Individual Therapy  Curtis Sites 09/19/2018, 7:28 AM

## 2018-09-19 NOTE — Progress Notes (Signed)
Occupational Therapy Note  Patient Details  Name: Howard Bell MRN: [id number] Date of Birth: [birth year]  Attempted to see pt to make up missed therapy time, but pt unavailable due to nursing care.  Will continue to follow per POC.  Simonne Come 09/19/2018, 3:07 PM

## 2018-09-19 NOTE — Progress Notes (Signed)
Pt's wife was called back as per her request,nobody answer the phone,a message was left on the answer machine.

## 2018-09-19 NOTE — Care Management (Signed)
Inpatient [facility] Individual Statement of Services  Patient Name:  Howard Bell  Date:  09/19/2018  Welcome to the [facility].  Our goal is to provide you with an individualized program based on your diagnosis and situation, designed to meet your specific needs.  With this comprehensive rehabilitation program, you will be expected to participate in at least 3 hours of rehabilitation therapies Monday-Friday, with modified therapy programming on the weekends.  Your rehabilitation program will include the following services:  Physical Therapy (PT), Occupational Therapy (OT), Speech Therapy (ST), 24 hour per day rehabilitation nursing, Therapeutic Recreaction (TR), Neuropsychology, Case Management (Social Worker), Rehabilitation Medicine, Nutrition Services and Pharmacy Services  Weekly team conferences will be held on Wednesdays to discuss your progress.  Your Social Worker will talk with you frequently to get your input and to update you on team discussions.  Team conferences with you and your family in attendance may also be held.  Expected length of stay: 14-18 days   Overall anticipated outcome: supervision @ wheelchair level  Depending on your progress and recovery, your program may change. Your Social Worker will coordinate services and will keep you informed of any changes. Your Social Worker's name and contact numbers are listed  below.  The following services may also be recommended but are not provided by the [facility] will be made to provide these services after discharge if needed.  Arrangements include referral to agencies that provide these services.  Your insurance has been verified to be:  Medicare Your primary doctor is:  [facility]  Pertinent information will be shared with your doctor and your insurance  company.  Social Worker:  [place], [place] or ([phone]   Information discussed with and copy given to patient by: Lennart Pall, 09/19/2018, 1:38 PM

## 2018-09-19 NOTE — Progress Notes (Signed)
Speech Language Pathology Daily Session Note  Patient Details  Name: Howard Bell MRN: [id number] Date of Birth: 22-Apr-1965  Today's Date: 09/19/2018 SLP Individual Time: 0730-0800 SLP Individual Time Calculation (min): 30 min  Short Term Goals: Week 1: SLP Short Term Goal 1 (Week 1): Pt will demonstrate sustained attention for 5 minute intervals during functional tasks with mod A verbal cues for redirection. SLP Short Term Goal 2 (Week 1): Pt will demonstrate functional problem solving for basic and familiar tasks with min A verbal cues. SLP Short Term Goal 3 (Week 1): Pt will self-monitor and self-correct functional errors with max A verbal cues. SLP Short Term Goal 4 (Week 1): Pt will utilize external memory aids to recall new, daily information with mod A verbal cues. SLP Short Term Goal 5 (Week 1): Pt will demonstrate orientation x4 with supervision A verbal cues.  Skilled Therapeutic Interventions:  Skilled treatment focused on cognition goals. SLP facilitated session by providing Max A cues for sustained attention to task, basic problem solving simple card game (WAR), awareness that card needed to be turned upright and for comparison of numbers on card. Pt left upright in bed, bed alarm on and all needs within reach.      Pain    Therapy/Group: Individual Therapy  Lachlan Pelto 09/19/2018, 12:22 PM

## 2018-09-19 NOTE — Progress Notes (Signed)
[facility] PHYSICAL MEDICINE & REHABILITATION PROGRESS NOTE   Subjective/Complaints: Patient seen laying in bed this morning working with therapies.  He states he slept well overnight.  He notes he was seen by vascular yesterday and sutures/staples removed, discussed with therapies as well.  The VAC to his left lower extremity remains in place, discussed with nursing.  ROS: Denies CP, shortness of breath, nausea, vomiting, diarrhea.   Objective:   No results found. Recent Labs    09/18/18 2129  WBC 7.0  HGB 7.1*  HCT 23.7*  PLT 235   Recent Labs    09/17/18 0759 09/18/18 2129  NA 133* 130*  K 3.8 4.6  CL 93* 92*  CO2 28 24  GLUCOSE 183* 111*  BUN 52* 70*  CREATININE 10.34* 13.05*  CALCIUM 9.2 9.2    Intake/Output Summary (Last 24 hours) at 09/19/2018 0920 Last data filed at 09/19/2018 0000 Gross per 24 hour  Intake 220 ml  Output 2500 ml  Net -2280 ml     Physical Exam: Vital Signs Blood pressure 122/76, pulse 81, temperature 99.5 F (37.5 C), temperature source Oral, resp. rate 18, height 6' (1.829 m), weight 89.3 kg, SpO2 92 %. Constitutional: No distress . Vital signs reviewed. HENT: Normocephalic.  Atraumatic. Eyes: EOMI.  No discharge. Cardiovascular: No JVD. Respiratory: Normal effort. GI: Non-distended. Musc: Right AKA with improving edema and tenderness Left AKA with tenderness, unchanged Neurological: He is alert and oriented  Motor: Bilateral UE 5/5 proximal to distal.  RLE 4+/5 hip flexion LLE: 3/5 hip flexion Skin: Vac Left AKA Right AKA with staples/sutures Psychiatric: Flat  Assessment/Plan: 1. Functional deficits secondary to left AKA which require 3+ hours per day of interdisciplinary therapy in a comprehensive inpatient rehab setting.  Physiatrist is providing close team supervision and 24 hour management of active medical problems listed below.  Physiatrist and rehab team continue to assess barriers to discharge/monitor patient  progress toward functional and medical goals  Care Tool:  Bathing    Body parts bathed by patient: Right arm, Left arm, Chest, Abdomen, Face, Front perineal area, Right upper leg, Left upper leg   Body parts bathed by helper: Buttocks Body parts n/a: Right lower leg, Left lower leg   Bathing assist Assist Level: Moderate Assistance - Patient 50 - 74%     Upper Body Dressing/Undressing Upper body dressing   What is the patient wearing?: Pull over shirt    Upper body assist Assist Level: Set up assist    Lower Body Dressing/Undressing Lower body dressing      What is the patient wearing?: Pants, Underwear/pull up     Lower body assist Assist for lower body dressing: Maximal Assistance - Patient 25 - 49%     Toileting Toileting Toileting Activity did not occur (Clothing management and hygiene only): Safety/medical concerns  Toileting assist Assist for toileting: Moderate Assistance - Patient 50 - 74%     Transfers Chair/bed transfer  Transfers assist  Chair/bed transfer activity did not occur: Safety/medical concerns  Chair/bed transfer assist level: Minimal Assistance - Patient > 75% Chair/bed transfer assistive device: Sliding board, Armrests   Locomotion Ambulation   Ambulation assist   Ambulation activity did not occur: N/A          Walk 10 feet activity   Assist  Walk 10 feet activity did not occur: N/A        Walk 50 feet activity   Assist  Walk 150 feet activity   Assist Walk 150 feet activity did not occur: N/A         Walk 10 feet on uneven surface  activity   Assist Walk 10 feet on uneven surfaces activity did not occur: N/A         Wheelchair     Assist Will patient use wheelchair at discharge?: Yes Type of Wheelchair: Manual    Wheelchair assist level: Supervision/Verbal cueing Max wheelchair distance: 120    Wheelchair 50 feet with 2 turns activity    Assist        Assist Level:  Supervision/Verbal cueing   Wheelchair 150 feet activity     Assist     Assist Level: Supervision/Verbal cueing    Medical Problem List and Plan: 1.Functional deficitssecondary to PAD, new left AKA (09/08/2018), recent right AKA (08/04/2018) Continue CIR 2. Antithrombotics: -DVT/anticoagulation:Pharmaceutical:Heparin -antiplatelet therapy: N/A 3. Pain Management:Oxycodone prn  Added gabapentin for phantom pain LLE, 100mg  qhs 4. Mood:LCSW to follow for evaluation and support. -antipsychotic agents: N/A 5. Neuropsych: This patientis not fullycapable of making decisions on hisown behalf. 6. Skin/Wound Care:  DC VAC to left AKA 7. Fluids/Electrolytes/Nutrition:Strict I/O. Daily weights. Labs with HD. 8. OSA/Chronic respiratory failure: Oxygen dependent--2 L during the day and 3 liters at nights. 9. HTN: Monitor BP TID--On Norvasc and metoprolol daily  Relatively controlled on 6/30 10. T2DM with gastroparesis and neuropathy: Brittle diabetic. Continue SSI as at home.   Reglan tid ac for gastroparesis.   Lantus start over the weekend, but [place] on 6/30 due to insulin sensitivity and previous hypoglycemic episodes requiring transfer to acute  CBGs trending down on 6/30  Cont q2 CBGs until stabalized, discussed with nursing. 11. Glaucoma with poor vision: On Timoptic at bedtime and Alphagan bid 12. Acute on chronic anemia: On Aranesp weekly.  Hemoglobin 7.1 on 6/29  Continue to monitor 13. ESRD: HD MWF.  -HD after therapy day to allow participation in rehab activities  -nephrology following, appreciate Recs    LOS: 3 days A FACE TO FACE EVALUATION WAS PERFORMED  Cherlynn Popiel Lorie Phenix 09/19/2018, 9:20 AM

## 2018-09-19 NOTE — Progress Notes (Signed)
RN try to call back pt's wife,nobody answer the phone.

## 2018-09-19 NOTE — Progress Notes (Signed)
Post Dialysis report received from [place], RN, patient reported stable throughout procedure in no acute distress Goal 3 liters removed 2.5 liters, assigned RN ( Daude) informed of report., .

## 2018-09-20 ENCOUNTER — Inpatient Hospital Stay (HOSPITAL_COMMUNITY): Payer: Medicare Other

## 2018-09-20 DIAGNOSIS — N186 End stage renal disease: Secondary | ICD-10-CM | POA: Diagnosis not present

## 2018-09-20 DIAGNOSIS — E1129 Type 2 diabetes mellitus with other diabetic kidney complication: Secondary | ICD-10-CM | POA: Diagnosis not present

## 2018-09-20 DIAGNOSIS — Z992 Dependence on renal dialysis: Secondary | ICD-10-CM | POA: Diagnosis not present

## 2018-09-20 LAB — PREPARE RBC (CROSSMATCH)

## 2018-09-20 LAB — RENAL FUNCTION PANEL
Albumin: 2.4 g/dL — ABNORMAL LOW (ref 3.5–5.0)
Anion gap: 13 (ref 5–15)
BUN: 60 mg/dL — ABNORMAL HIGH (ref 6–20)
CO2: 24 mmol/L (ref 22–32)
Calcium: 9.1 mg/dL (ref 8.9–10.3)
Chloride: 95 mmol/L — ABNORMAL LOW (ref 98–111)
Creatinine, Ser: 10.6 mg/dL — ABNORMAL HIGH (ref 0.61–1.24)
GFR calc Af Amer: 6 mL/min — ABNORMAL LOW (ref >60)
GFR calc non Af Amer: 5 mL/min — ABNORMAL LOW (ref >60)
Glucose, Bld: 101 mg/dL — ABNORMAL HIGH (ref 70–99)
Phosphorus: 4.9 mg/dL — ABNORMAL HIGH (ref 2.5–4.6)
Potassium: 4.5 mmol/L (ref 3.5–5.1)
Sodium: 132 mmol/L — ABNORMAL LOW (ref 135–145)

## 2018-09-20 LAB — GLUCOSE, CAPILLARY
Glucose-Capillary: 93 mg/dL (ref 70–99)
Glucose-Capillary: 94 mg/dL (ref 70–99)
Glucose-Capillary: 100 mg/dL — ABNORMAL HIGH (ref 70–99)
Glucose-Capillary: 138 mg/dL — ABNORMAL HIGH (ref 70–99)
Glucose-Capillary: 153 mg/dL — ABNORMAL HIGH (ref 70–99)
Glucose-Capillary: 125 mg/dL — ABNORMAL HIGH (ref 70–99)
Glucose-Capillary: 148 mg/dL — ABNORMAL HIGH (ref 70–99)

## 2018-09-20 LAB — CBC
HCT: 24 % — ABNORMAL LOW (ref 39.0–52.0)
Hemoglobin: 7.2 g/dL — ABNORMAL LOW (ref 13.0–17.0)
MCH: 29 pg (ref 26.0–34.0)
MCHC: 30 g/dL (ref 30.0–36.0)
MCV: 96.8 fL (ref 80.0–100.0)
Platelets: 246 10*3/uL (ref 150–400)
RBC: 2.48 MIL/uL — ABNORMAL LOW (ref 4.22–5.81)
RDW: 17.6 % — ABNORMAL HIGH (ref 11.5–15.5)
WBC: 6.2 10*3/uL (ref 4.0–10.5)
nRBC: 0 % (ref 0.0–0.2)

## 2018-09-20 MED ORDER — LIDOCAINE-PRILOCAINE 2.5-2.5 % EX CREA
1.0000 "application " | TOPICAL_CREAM | CUTANEOUS | Status: DC | PRN
  Filled 2018-09-20: qty 5

## 2018-09-20 MED ORDER — PENTAFLUOROPROP-TETRAFLUOROETH EX AERO: 1.0000 "application " | INHALATION_SPRAY | CUTANEOUS | Status: DC | PRN

## 2018-09-20 MED ORDER — SODIUM CHLORIDE 0.9 % IV SOLN: 100.0000 mL | INTRAVENOUS | Status: DC | PRN

## 2018-09-20 MED ORDER — LIDOCAINE HCL (PF) 1 % IJ SOLN: 5.0000 mL | INTRAMUSCULAR | Status: DC | PRN

## 2018-09-20 MED ORDER — HEPARIN SODIUM (PORCINE) 1000 UNIT/ML DIALYSIS
9000.0000 [IU] | Freq: Once | INTRAMUSCULAR | Status: DC
  Filled 2018-09-20: qty 9

## 2018-09-20 NOTE — Progress Notes (Signed)
Doctor on call stated to wait for Rbc transfusion until the morning.Will have nurse follow up in the morning.

## 2018-09-20 NOTE — Progress Notes (Signed)
 [facility] Progress Note   Subjective: Initially refused to come to HD but says he will come now. In bed, no c/os.    Objective Vitals:   09/20/18 0104 09/20/18 0500 09/20/18 0544 09/20/18 1348  BP:   140/64 134/75  Pulse: 81  83 79  Resp: 16  18 16   Temp:   97.9 F (36.6 C) 98.5 F (36.9 C)  TempSrc:    Oral  SpO2: 98%  91% 94%  Weight:  95.3 kg    Height:       Physical Exam General:Chronically ill appearing male in NAD Heart:S1,S2 RRR Lungs:CTAB A/P Abdomen:active BS Extremities:L AKA with ace wrap. R AKA suture line approximated without drainage. No stump edema Dialysis Access:L AVG + bruit  Additional Objective Labs: Basic Metabolic Panel: Recent Labs  Lab 09/15/18 0721 09/17/18 0759 09/18/18 2129  NA 130* 133* 130*  K 4.2 3.8 4.6  CL 92* 93* 92*  CO2 25 28 24   GLUCOSE 157* 183* 111*  BUN 58* 52* 70*  CREATININE 10.27* 10.34* 13.05*  CALCIUM 9.1 9.2 9.2  PHOS  --   --  6.2*   Liver Function Tests: Recent Labs  Lab 09/17/18 0759 09/18/18 2129  AST 13*  --   ALT 6  --   ALKPHOS 99  --   BILITOT 0.5  --   PROT 7.4  --   ALBUMIN 2.4* 2.2*   No results for input(s): LIPASE, AMYLASE in the last 168 hours. CBC: Recent Labs  Lab 09/14/18 0319 09/15/18 0721 09/18/18 2129  WBC 5.7 6.1 7.0  HGB 7.3* 7.6* 7.1*  HCT 23.9* 24.6* 23.7*  MCV 98.0 95.7 96.0  PLT 201 203 235   Blood Culture    Component Value Date/Time   SDES BLOOD LEFT ANTECUBITAL 08/15/2018 1526   SPECREQUEST  08/15/2018 1526    BOTTLES DRAWN AEROBIC ONLY Blood Culture adequate volume   CULT  08/15/2018 1526    NO GROWTH 5 DAYS Performed at [facility], [place] [address] [place], [place] [address]    REPTSTATUS 08/20/2018 FINAL 08/15/2018 1526    Cardiac Enzymes: No results for input(s): CKTOTAL, CKMB, CKMBINDEX, TROPONINI in the last 168 hours. CBG: Recent Labs  Lab 09/20/18 0203 09/20/18 0643 09/20/18 0814 09/20/18 1023 09/20/18 1208   GLUCAP 93 94 100* 138* 153*   Iron Studies: No results for input(s): IRON, TIBC, TRANSFERRIN, FERRITIN in the last 72 hours. @lablastinr3 @ Studies/Results: No results found. Medications: . ferric gluconate (FERRLECIT/NULECIT) IV     . sodium chloride   Intravenous Once  . amLODipine  10 mg Oral Daily  . brimonidine  1 drop Both Eyes BID  . chlorhexidine  15 mL Mouth Rinse BID  . Chlorhexidine Gluconate Cloth  6 each Topical Q0600  . [START ON 09/22/2018] darbepoetin (ARANESP) injection - DIALYSIS  200 mcg Intravenous Q Fri-HD  . docusate sodium  100 mg Oral BID  . feeding supplement (NEPRO CARB STEADY)  237 mL Oral Q24H  . feeding supplement (PRO-STAT SUGAR FREE 64)  30 mL Oral BID  . ferric citrate  210 mg Oral TID WC  . fluticasone  1 spray Each Nare Daily  . gabapentin  100 mg Oral QHS  . heparin  5,000 Units Subcutaneous Q8H  . insulin aspart  0-5 Units Subcutaneous QHS  . insulin aspart  0-9 Units Subcutaneous TID WC  . metoCLOPramide  5 mg Oral TID AC  . metoprolol succinate  50 mg Oral Daily  .  multivitamin  1 tablet Oral QHS  . mupirocin ointment   Nasal BID  . pantoprazole  40 mg Oral Daily  . timolol  1 drop Both Eyes BID     Dialysis Orders: AF MWF  4h 20min 450/800 93kg 2/2.25 bath Hep 9000 L AVG - parsabiv 10, iPTH 640 6/17  - Hb 8 at dc 6/10 on home O2 - Mircera 200 given 6/16 - tsat after last hosp d/c 37% - had been offParsabiv during recent hospitalization  Assessment/Plan: 1. Debility: on CIR now 2. 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 Then went to CIR, then came back for AMS. Then went back to CIR. Then underwent L AKA 09/08/18 for multiple wounds not responding to Rx. Now is bilat AKA.  3. ESRD- MWF HD.HD today on schedule.  4. Hypertension/volume- Lower EDW on DC. No evidence of volume overload by exam. BP on soft side. Monitor.  5. Anemia-HGB down to 7.1.  1 unit of PRBCs on HD tomorrow. No ESA  In over 1 week. Give tomorrow.  6. Metabolic bone disease- Holding parsabiv while here due to unavailability; Corrected Ca 10.6 Stop Ca Acetate, start auryxia 210 mg PO TIW. Phos 6.2 7. Nutrition- chronic low alb./ prostat ordered 8. DM- per primary   H.  NP-C 09/20/2018, 1:55 PM  [employer] [phone]

## 2018-09-20 NOTE — Progress Notes (Signed)
Social Work Assessment and Plan   Patient Details  Name: Howard Bell MRN: [id number] Date of Birth: [birth year]  Today's Date: 09/18/2018  Problem List:  Patient Active Problem List   Diagnosis Date Noted  . S/P AKA (above knee amputation) unilateral, right ([place])   . S/P AKA (above knee amputation) bilateral ([place])   . Acute on chronic anemia   . Diabetic peripheral neuropathy ([place])   . Pressure injury of skin 09/16/2018  . Status post bilateral above knee amputation ([place]) 09/08/2018  . Acute respiratory failure with hypercapnia ([place])   . Atherosclerosis of native arteries of extremities with gangrene, left leg ([place]) 09/05/2018  . Hypoxia   . ESRD (end stage renal disease) ([place])   . Labile blood glucose   . Postoperative pain   . Dry gangrene (HCC)--left foot/left shin   . SIRS (systemic inflammatory response syndrome) (HCC)   . Poorly controlled type 2 diabetes mellitus with peripheral neuropathy ([place])   . Peripheral arterial disease ([place])   . Unilateral AKA, right ([place]) 08/17/2018  . Sepsis ([place]) 08/15/2018  . Acute metabolic encephalopathy 78/29/5621  . Gangrene of left foot ([place])   . Hypoglycemia   . Drug induced constipation   . Confusion, postoperative   . Labile blood pressure   . Uncontrolled diabetes mellitus type 2 with peripheral artery disease ([place])   . Anemia of chronic disease   . Diabetes mellitus type 2 in nonobese (HCC)   . Supplemental oxygen dependent   . Unilateral AKA, left ([place]) 08/07/2018  . S/P BKA (below knee amputation), right ([place]) 08/04/2018  . Severe protein-calorie malnutrition ([place]) 08/01/2018  . Dehiscence of amputation stump ([place]) 08/01/2018  . Cutaneous abscess of left foot 08/01/2018  . Cellulitis of right lower extremity   . Influenza A 05/12/2018  . Pneumonia 05/12/2018  . Gangrene of right foot ([place])   . Critical limb ischemia with history of revascularization of same extremity 05/09/2018  . LUQ pain   . Benign hypertensive  heart and kidney disease with HF and CKD stage V ([place]) 11/16/2017  . 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 ([place]) 11/16/2017  . CKD stage 5 due to type 2 diabetes mellitus ([place]) 11/16/2017  . Glaucoma due to type 2 diabetes mellitus ([place]) 11/16/2017  . Chronic generalized pain 11/16/2017  . Recurrent pneumonia 11/06/2017  . Diabetic gastroparesis ([place]) 11/06/2017  . Generalized abdominal pain 09/13/2017  . Acute pain of right shoulder 07/26/2017  . Acute bursitis of right shoulder 07/26/2017  . Left arm swelling 03/28/2017  . ESRD on dialysis ([place]) 03/28/2017  . Chest pain 11/26/2016  . Essential hypertension 11/26/2016  . Anemia due to end stage renal disease ([place]) 08/04/2016  . Acute respiratory failure with hypoxemia ([place]) 08/04/2016  . Type 2 diabetes mellitus with foot ulcer ([place]) 08/04/2016  . Acute CHF (congestive heart failure) ([place]) 08/04/2016  . Elevated troponin    Past Medical History:  Past Medical History:  Diagnosis Date  . Arthritis   . Congestive heart failure (CHF) ([place])   . Depression   . 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 Diabetic gastroparesis ([place]) 11/06/2017  . ESRD (end stage renal disease) on dialysis [facility])    M,W.F dialysis  . H/O seasonal allergies   . Hypertension   . Pneumonia   . Sleep apnea    not using it now, on continuous O2 2 L during the day, 3 L during the night  Past Surgical History:  Past Surgical History:  Procedure Laterality Date  . AMPUTATION Right 05/14/2018   Procedure: AMPUTATION BELOW KNEE;  Surgeon: Newt Minion, MD;  Location: [place];  Service: Orthopedics;  Laterality: Right;  . AMPUTATION Right 08/04/2018   Procedure: RIGHT ABOVE KNEE AMPUTATION;  Surgeon: Newt Minion, MD;  Location: [place];  Service: Orthopedics;  Laterality: Right;  . AMPUTATION Left 09/08/2018   Procedure: LEFT ABOVE KNEE AMPUTATION;  Surgeon: Newt Minion, MD;  Location:  [place];  Service: Orthopedics;  Laterality: Left;  . APPLICATION OF WOUND VAC Left 09/08/2018   Procedure: Application Of Wound Vac;  Surgeon: Newt Minion, MD;  Location: [facility];  Service: Orthopedics;  Laterality: Left;  . ESOPHAGOGASTRODUODENOSCOPY (EGD) WITH PROPOFOL N/A 12/05/2017   Procedure: ESOPHAGOGASTRODUODENOSCOPY (EGD) WITH PROPOFOL;  Surgeon: Doran Stabler, MD;  Location: [facility];  Service: Gastroenterology;  Laterality: N/A;  . HERNIA REPAIR    . IR AV DIALY SHUNT INTRO NEEDLE/INTRACATH INITIAL W/PTA/IMG LEFT  03/30/2017   Social History:  reports that he has never smoked. He has never used smokeless tobacco. He reports that he does not drink alcohol or use drugs.  Family / Support Systems Marital Status: Married How Long?: 5 yrs Patient Roles: Spouse Spouse/Significant Other: wife, Howard Bell @ [phone] Children: 37 yo step-daughter, Chemical engineer Anticipated Caregiver: [name], spouse Ability/Limitations of Caregiver: min assist, has been providing assist since recent d/c from CIR Caregiver Availability: 24/7 Family Dynamics: Wife has been the primary caregiver for pt since his prior CIR d/c - supportive  Social History Preferred language: English Religion: Seventh Day Adventist Cultural Background: NA Read: Yes Write: Yes Employment Status: Disabled Date Retired/Disabled/Unemployed: ~15 yrs Public relations account executive Issues: None Guardian/Conservator: None - per MD, pt is not capable of making decisions on his own behalf.   Abuse/Neglect Abuse/Neglect Assessment Can Be Completed: Yes Physical Abuse: Denies Verbal Abuse: Denies Sexual Abuse: Denies Exploitation of patient/patient's resources: Denies Self-Neglect: Denies  Emotional Status Pt's affect, behavior and adjustment status: Pt sitting up in w/c and able to provide basic information for assessment.  Appears fatigued and affect rather flat as similar to prior CIR stay.  Little communication.   Pt  denies any significant emotional distress, however, may benefit from neuropsychology consult while here. Recent Psychosocial Issues: Ongoing medical issues for many years and recent SNF placement. Psychiatric History: None Substance Abuse History: None  Patient / Family Perceptions, Expectations & Goals Pt/Family understanding of illness & functional limitations: Pt and wife with good understanding of his ongoing medical issues, current functional limitations/ need for return to CIR. Premorbid pt/family roles/activities: Wife has been providing primary caregiver support since prior CIR d/c. Anticipated changes in roles/activities/participation: Wife to continue providing primary caregiver support. Pt/family expectations/goals: "I just want to get home as soon as I can."  [employer]: None([facility] following since prior CIR d/c) Premorbid Home Care/DME Agencies: Other (Comment)([facility]) Transportation available at discharge: yes, wife and [facility] can assist with needs. Resource referrals recommended: Neuropsychology, Support group (specify)  Discharge Planning Living Arrangements: Spouse/significant other Support Systems: Spouse/significant other Type of Residence: Private residence Insurance Resources: [employer] Financial Resources: SSD Financial Screen Referred: No Living Expenses: Rent Money Management: Spouse Does the patient have any problems obtaining your medications?: No Home Management: mostly wife Patient/Family Preliminary Plans: Pt to d/c home with wife to provide 24/7 assistance. Social Work Anticipated Follow Up Needs: [place] Additional Notes/Comments: Pt to return home with  wife to resume caregiver support role. Expected length of stay: 14-16 days  Clinical Impression Returning pt to CIR and familiar to this SW.  Affect remains very flat and disengaged overall.  He is eager to get home ASAP (as he was with prior stay.  Wife is  primary caregiver and will continue 24/7 care.  Will follow for any d/c planning and support needs.  Kenidi Elenbaas 09/18/2018, 9:26 AM

## 2018-09-20 NOTE — Progress Notes (Signed)
Physical Therapy Session Note  Patient Details  Name: Howard Bell MRN: [id number] Date of Birth: 1965-07-06  Today's Date: 09/20/2018 PT Individual Time: 3762-8315; 1761-6073 PT Individual Time Calculation (min): 60 min , 45 min  Short Term Goals: Week 1:  PT Short Term Goal 1 (Week 1): Patient to demonstrate dynamic sitting balance with S.  PT Short Term Goal 2 (Week 1): pt will perform bed<>chair transfers with slideboard or A-P with min assist PT Short Term Goal 3 (Week 1): Pt will perform car transfer with min assist  Skilled Therapeutic Interventions/Progress Updates:  tx 1:  Pt asleep in bed but easily awakened.  He denied pain.  PT re-wrapped R residual limb with 2 4" ACEs.  Although he felt that the wrap was sliding off the end, initially the pt refused to have it re-wrapped.  PT educated pt on shaping limb and avoiding edema for improved healing and he complied.  Attempted supine > sit in flat bed, no rails.  Pt stated " I don't want one of those hospital beds in my house."  He was unable to arise, even with max assist.  Using top rail and max assist in flat bed, pt able to come up to sitting with max assist.  Seated balance activity: 5 x 1 each:  R/L lateral leans, posterior/anterior leans, all with close supervision..    Slide board transfer slightly downhill to R, min assist, max cues.  Hand -over- hand assistance to return R armrest to locked position, and find brakes, which have extensions.  W/c propulsion x 100' x 2 with supervision.  Pt resistant to suggestions for efficiency. PT placed 4# weights on front of w/c frame for improved weight distribution , given bil AKAs.  Pt appeared to push w/c more efficiently, but he stated there was not difference.  PT will continue to assess.   Seated Therapeutic exercise performed with UEs, LEs to increase strength for functional mobility: 15 x 2 bil adductor squeezes against towel roll; 15 x 1 R/L hip extension against towel  roll; 10 x 1 each D1 diagonal PNF patterns with 5# weighted bar held by bil hands.   At end of session, pt sitting in w/c with needs at hand and seat belt alarm set.   tx 2:  Pt dozing in w/c.  Easily awakened to touch.  W/c propulsion x 70' including turns, superviison.  Simulated car transfer to 20" height seat (wife has a Camry) with min assist for board placement. Pt resistant to suggestions for head-hips relationship to make transfer easier.    Pt needed assistance for managing L armrest up/down for transfer.  He removed and replaced brake extensions with extra time and VCs.  Pt stated that he was tired.  O2 sats 93%, HR 78.  Using UBE at 5.o resistance,backwards, x 1 minute.  At 4.5 resistance, backwards x 1 minute.  Pt declined further ex; falling asleep as he removed UEs.Marland Kitchen  wc propulsion x 70', x 55' including turns, with supervision.  At end of session, pt resting in w/c with needs at hand and seat belt alarm set.     Therapy Documentation Precautions:  Precautions Precautions: Fall Precaution Comments: wound VAC surgical incision. hx of Right AKA & L AKA. 2L oxygen Restrictions Weight Bearing Restrictions: Yes RLE Weight Bearing: Non weight bearing LLE Weight Bearing: Non weight bearing  O2 Device: Nasal Cannula O2 Flow Rate (L/min): 2 L/min Pain: pt denies Pain Assessment Pain Scale: 0-10 Pain Score: 0-No pain  Therapy/Group: Individual Therapy  Isaid Salvia 09/20/2018, 10:19 AM

## 2018-09-20 NOTE — Progress Notes (Signed)
[facility] PHYSICAL MEDICINE & REHABILITATION PROGRESS NOTE   Subjective/Complaints: Patient seen sitting up at the edge of his bed this morning in breakfast, working with therapies.  He states he slept well overnight.  Discussed transfers with therapies.  Patient with questions regarding discharge.  ROS: Denies CP, shortness of breath, nausea, vomiting, diarrhea.   Objective:   No results found. Recent Labs    09/18/18 2129  WBC 7.0  HGB 7.1*  HCT 23.7*  PLT 235   Recent Labs    09/18/18 2129  NA 130*  K 4.6  CL 92*  CO2 24  GLUCOSE 111*  BUN 70*  CREATININE 13.05*  CALCIUM 9.2    Intake/Output Summary (Last 24 hours) at 09/20/2018 0846 Last data filed at 09/19/2018 1826 Gross per 24 hour  Intake 480 ml  Output -  Net 480 ml     Physical Exam: Vital Signs Blood pressure 140/64, pulse 83, temperature 97.9 F (36.6 C), resp. rate 18, height 6' (1.829 m), weight 95.3 kg, SpO2 91 %. Constitutional: No distress . Vital signs reviewed. HENT: Normocephalic.  Atraumatic. Eyes: EOMI.  No discharge. Cardiovascular: No JVD. Respiratory: Normal effort. GI: Non-distended. Musc: Right AKA with edema and tenderness Left AKA with edema tenderness, unchanged Neurological: He is alert and oriented  Motor: Bilateral UE 5/5 proximal to distal.  RLE 4+/5 hip flexion LLE: 3+/5 hip flexion Skin: Left AKA with sutures/staples C/D/I Right AKA healing Psychiatric: Flat  Assessment/Plan: 1. Functional deficits secondary to left AKA which require 3+ hours per day of interdisciplinary therapy in a comprehensive inpatient rehab setting.  Physiatrist is providing close team supervision and 24 hour management of active medical problems listed below.  Physiatrist and rehab team continue to assess barriers to discharge/monitor patient progress toward functional and medical goals  Care Tool:  Bathing    Body parts bathed by patient: Right arm, Left arm, Chest, Abdomen, Face,  Front perineal area, Right upper leg, Left upper leg   Body parts bathed by helper: Buttocks Body parts n/a: Right lower leg, Left lower leg   Bathing assist Assist Level: Moderate Assistance - Patient 50 - 74%     Upper Body Dressing/Undressing Upper body dressing   What is the patient wearing?: Pull over shirt    Upper body assist Assist Level: Set up assist    Lower Body Dressing/Undressing Lower body dressing      What is the patient wearing?: Pants, Underwear/pull up     Lower body assist Assist for lower body dressing: Maximal Assistance - Patient 25 - 49%     Toileting Toileting    Toileting assist Assist for toileting: 2 Helpers     Transfers Chair/bed transfer  Transfers assist  Chair/bed transfer activity did not occur: Safety/medical concerns  Chair/bed transfer assist level: Moderate Assistance - Patient 50 - 74% Chair/bed transfer assistive device: Sliding board   Locomotion Ambulation   Ambulation assist   Ambulation activity did not occur: N/A          Walk 10 feet activity   Assist  Walk 10 feet activity did not occur: N/A        Walk 50 feet activity   Assist           Walk 150 feet activity   Assist Walk 150 feet activity did not occur: N/A         Walk 10 feet on uneven surface  activity   Assist Walk 10 feet on uneven surfaces activity  did not occur: N/A         Wheelchair     Assist Will patient use wheelchair at discharge?: Yes Type of Wheelchair: Manual    Wheelchair assist level: Supervision/Verbal cueing Max wheelchair distance: 120    Wheelchair 50 feet with 2 turns activity    Assist        Assist Level: Supervision/Verbal cueing   Wheelchair 150 feet activity     Assist     Assist Level: Supervision/Verbal cueing    Medical Problem List and Plan: 1.Functional deficitssecondary to PAD, new left AKA (09/08/2018), recent right AKA (08/04/2018) Continue  CIR  Team conference today to discuss current and goals and coordination of care, home and environmental barriers, and discharge planning with nursing, case manager, and therapies.  2. Antithrombotics: -DVT/anticoagulation:Pharmaceutical:Heparin -antiplatelet therapy: N/A 3. Pain Management:Oxycodone prn  Added gabapentin for phantom pain LLE, 100mg  qhs 4. Mood:LCSW to follow for evaluation and support. -antipsychotic agents: N/A 5. Neuropsych: This patientis not fullycapable of making decisions on hisown behalf. 6. Skin/Wound Care:  VAC DC'd on 6/30 7. Fluids/Electrolytes/Nutrition:Strict I/O. Daily weights. Labs with HD. 8. OSA/Chronic respiratory failure: Oxygen dependent--2 L during the day and 3 liters at nights.  Continue supplemental oxygen 9. HTN: Monitor BP TID--On Norvasc and metoprolol daily  Relatively controlled on 7/1 10. T2DM with gastroparesis and neuropathy: Brittle diabetic. Continue SSI as at home.   Reglan tid ac for gastroparesis.   Lantus start over the weekend, but [place] on 6/30 due to insulin sensitivity and previous hypoglycemic episodes requiring transfer to acute  Relatively controlled on 7/1 11. Glaucoma with poor vision: On Timoptic at bedtime and Alphagan bid 12. Acute on chronic anemia: On Aranesp weekly.  Hemoglobin 7.1 on 6/29  Continue to monitor 13. ESRD: HD MWF.  -HD after therapy day to allow participation in rehab activities  -nephrology following, appreciate Recs    LOS: 4 days A FACE TO [place] 09/20/2018, 8:46 AM

## 2018-09-20 NOTE — Progress Notes (Signed)
Received a call from the blood bank on regard a unit of blood that was ready to transfuse,RN explained to them that the order was place yesterday for the pt. To received the blood on hemodialysis today.When Hemodialysis call to give report on the pt. Coming back to the unit,RN question if pt. Got the unit of blood and the Hemodialysis nurse said she did not see the order and that the pt. did not received the transfusion,she said she will notify the MD. About that.

## 2018-09-20 NOTE — Progress Notes (Signed)
Pt placed on full face BIPAP for the night.  Tolerating well at this time.  RT will continue to monitor.

## 2018-09-20 NOTE — Progress Notes (Signed)
Blood not given in HD, Dr Joelyn Oms notified of above, order received to give blood on the floor, notified Primary RN of order

## 2018-09-20 NOTE — Plan of Care (Signed)
  Problem: RH Cognition - SLP Goal: RH LTG Patient will demonstrate orientation with cues Description:  LTG:  Patient will demonstrate orientation to person/place/time/situation with cues (SLP)   09/20/2018 1414 by Stormy Fabian, [place] Note: With use of external memory aid 09/20/2018 1413 by Rutherford Nail, Mable Dara, [place] (Taken 09/20/2018 1413) LTG Patient will demonstrate orientation to:  Place  Time  Situation LTG: Patient will demonstrate orientation using cueing (SLP): Moderate Assistance - Patient 50 - 74%   Problem: RH Problem Solving Goal: LTG Patient will demonstrate problem solving for (SLP) Description: LTG:  Patient will demonstrate problem solving for basic/complex daily situations with cues  (SLP) Flowsheets (Taken 09/20/2018 1414) LTG Patient will demonstrate problem solving for: Moderate Assistance - Patient 50 - 74% Note: D/t prolonged lethargy   Problem: RH Memory Goal: LTG Patient will use memory compensatory aids to (SLP) Description: LTG:  Patient will use memory compensatory aids to recall biographical/new, daily complex information with cues (SLP) Flowsheets (Taken 09/20/2018 1414) LTG: Patient will use memory compensatory aids to (SLP): Moderate Assistance - Patient 50 - 74% Note: D/t prolonged lethargy   Problem: RH Attention Goal: LTG Patient will demonstrate this level of attention during functional activites (SLP) Description: LTG:  Patient will will demonstrate this level of attention during functional activites (SLP) Flowsheets (Taken 09/20/2018 1414) LTG: Patient will demonstrate this level of attention during cognitive/linguistic activities with assistance of (SLP): Moderate Assistance - Patient 50 - 74% Note: Downgraded d/t prolonged lethargy

## 2018-09-20 NOTE — Progress Notes (Signed)
Occupational Therapy Session Note  Patient Details  Name: Howard Bell MRN: [id number] Date of Birth: April 19, 1965  Today's Date: 09/20/2018 OT Individual Time: 0700-0810 OT Individual Time Calculation (min): 70 min    Short Term Goals: Week 1:  OT Short Term Goal 1 (Week 1): Pt will don shorts with mod A using lateral leans OT Short Term Goal 2 (Week 1): Pt will complete transfer to [facility] with mod A OT Short Term Goal 3 (Week 1): Pt will require no more than CGA for EOB sitting balance  Skilled Therapeutic Interventions/Progress Updates:    Pt asleep upon arrival.  Pt required max multimodal cues to arouse and engage.  Pt slow to respond to commands/requests this morning.  Pt requires extra time to initiate response to requests. Pt required max A with max verbal cues for supine>sit EOB with use of bed rails.  Static sitting balance at supervision level EOB. Slide board transfers min A with max verbal cues for sequencing and safety awareness. Pt requested to return to bed and remained in bed with all needs within reach and bed alarm activated. Pt lethargic throughout session.   Therapy Documentation Precautions:  Precautions Precautions: Fall Precaution Comments: wound VAC surgical incision. hx of Right AKA & L AKA. 2L oxygen Restrictions Weight Bearing Restrictions: Yes RLE Weight Bearing: Non weight bearing LLE Weight Bearing: Non weight bearing Pain:     Therapy/Group: Individual Therapy  Leroy Libman 09/20/2018, 8:19 AM

## 2018-09-20 NOTE — Progress Notes (Signed)
Patient states he is not ready to be placed on BIPAP.  RT instructed patient to have RN call when he is ready.  Patient showed understanding.

## 2018-09-21 ENCOUNTER — Inpatient Hospital Stay (HOSPITAL_COMMUNITY): Payer: Medicare Other

## 2018-09-21 ENCOUNTER — Inpatient Hospital Stay (HOSPITAL_COMMUNITY): Payer: Medicare Other | Admitting: Speech Pathology

## 2018-09-21 DIAGNOSIS — G4733 Obstructive sleep apnea (adult) (pediatric): Secondary | ICD-10-CM

## 2018-09-21 LAB — GLUCOSE, CAPILLARY
Glucose-Capillary: 129 mg/dL — ABNORMAL HIGH (ref 70–99)
Glucose-Capillary: 129 mg/dL — ABNORMAL HIGH (ref 70–99)
Glucose-Capillary: 129 mg/dL — ABNORMAL HIGH (ref 70–99)
Glucose-Capillary: 150 mg/dL — ABNORMAL HIGH (ref 70–99)
Glucose-Capillary: 151 mg/dL — ABNORMAL HIGH (ref 70–99)
Glucose-Capillary: 167 mg/dL — ABNORMAL HIGH (ref 70–99)
Glucose-Capillary: 183 mg/dL — ABNORMAL HIGH (ref 70–99)

## 2018-09-21 NOTE — Patient Care Conference (Signed)
Inpatient RehabilitationTeam Conference and Plan of Care Update Date: 09/20/2018   Time: 2:05 PM    Patient Name: Howard Bell      Medical Record Number: [id number]  Date of Birth: Aug 23, 1965 Sex: Male         Room/Bed: 4M01C/4M01C-01 Payor Info: Payor: MEDICARE / Plan: MEDICARE PART A AND B / Product Type: *No Product type* /    Admitting Diagnosis: 5. Gen Team B AKA, gangrene; 17-18days  Admit Date/Time:  09/16/2018  1:36 PM Admission Comments: No comment available   Primary Diagnosis:  <principal problem not specified> Principal Problem: <principal problem not specified>  Patient Active Problem List   Diagnosis Date Noted  . OSA (obstructive sleep apnea)   . S/P AKA (above knee amputation) unilateral, right ([place])   . S/P AKA (above knee amputation) bilateral ([place])   . Acute on chronic anemia   . Diabetic peripheral neuropathy ([place])   . Pressure injury of skin 09/16/2018  . Status post bilateral above knee amputation ([place]) 09/08/2018  . Acute respiratory failure with hypercapnia ([place])   . Atherosclerosis of native arteries of extremities with gangrene, left leg ([place]) 09/05/2018  . Hypoxia   . ESRD (end stage renal disease) ([place])   . Labile blood glucose   . Postoperative pain   . Dry gangrene (HCC)--left foot/left shin   . SIRS (systemic inflammatory response syndrome) (HCC)   . Poorly controlled type 2 diabetes mellitus with peripheral neuropathy ([place])   . Peripheral arterial disease ([place])   . Unilateral AKA, right ([place]) 08/17/2018  . Sepsis ([place]) 08/15/2018  . Acute metabolic encephalopathy 40/98/1191  . Gangrene of left foot ([place])   . Hypoglycemia   . Drug induced constipation   . Confusion, postoperative   . Labile blood pressure   . Uncontrolled diabetes mellitus type 2 with peripheral artery disease ([place])   . Anemia of chronic disease   . Diabetes mellitus type 2 in nonobese (HCC)   . Supplemental oxygen dependent   . Unilateral AKA, left ([place]) 08/07/2018   . S/P BKA (below knee amputation), right ([place]) 08/04/2018  . Severe protein-calorie malnutrition ([place]) 08/01/2018  . Dehiscence of amputation stump ([place]) 08/01/2018  . Cutaneous abscess of left foot 08/01/2018  . Cellulitis of right lower extremity   . Influenza A 05/12/2018  . Pneumonia 05/12/2018  . Gangrene of right foot ([place])   . Critical limb ischemia with history of revascularization of same extremity 05/09/2018  . LUQ pain   . 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 ([place]) 11/16/2017  . CKD stage 5 due to type 2 diabetes mellitus ([place]) 11/16/2017  . Glaucoma due to type 2 diabetes mellitus ([place]) 11/16/2017  . Chronic generalized pain 11/16/2017  . Recurrent pneumonia 11/06/2017  . Diabetic gastroparesis ([place]) 11/06/2017  . Generalized abdominal pain 09/13/2017  . Acute pain of right shoulder 07/26/2017  . Acute bursitis of right shoulder 07/26/2017  . Left arm swelling 03/28/2017  . ESRD on dialysis ([place]) 03/28/2017  . Chest pain 11/26/2016  . Essential hypertension 11/26/2016  . Anemia due to end stage renal disease ([place]) 08/04/2016  . Acute respiratory failure with hypoxemia ([place]) 08/04/2016  . Type 2 diabetes mellitus with foot ulcer ([place]) 08/04/2016  . Acute CHF (congestive heart failure) ([place]) 08/04/2016  . Elevated troponin     Expected Discharge Date: Expected Discharge Date: 09/29/18  Team  Members Present: Physician leading conference: Dr. Delice Lesch Social Worker Present: Lennart Pall, LCSW Nurse Present: Rayetta Humphrey, RN PT Present: Georjean Mode, PT OT Present: Roanna Epley, [place], OT SLP Present: Stormy Fabian, SLP PPS Coordinator present : Gunnar Fusi, SLP     Current Status/Progress Goal Weekly Team Focus  Medical   Functional deficits secondary to PAD, new left AKA (09/08/2018), recent right AKA (08/04/2018)   Improve mobility, safety, BP, CBGs, ESRD  See above   Bowel/Bladder   [place] is con of B/B.  To remain con of B/B      Swallow/Nutrition/ Hydration             ADL's   LB bathing/dressing-mod A; slide board transfers-mod A; impaired safety awareness  supervision overall  functional tranfsers, BADL training, safety awareness, activity tolerance, BUE strengthening, w/c mobility   Mobility   max assist supine> sit in flat bed 1 rail , min assist SBT basic and car, S w/c propulsion, fatigued- dozes in the middle of exercises  S transers , S w/c x 150'  activity tolerance,  mobility, locomotion, pt education   Communication             Safety/Cognition/ Behavioral Observations  Max A sustained attention, basic problem solving, awareness  Mod A - downgraded 09/20/18  sustained attention, memory, orientation   Pain   Patient has no c/o pain at this time.  To remain free of pain.      Skin   Patient has a skin lesion on the right hand. Wound care to full thickness skin lesion at right hand, 5th digit: Cleanse with soap and water, rinse and pat dry. Cover with size-appropriate piece of xeroform gauze Kellie Simmering # [id number]), top with dry gauze and secure with Conform bandaging/paper tape.  Change daily for two weeks.  To keep the bandage clean and dry and allow for healing.       Rehab Goals Patient on target to meet rehab goals: Yes *See Care Plan and progress notes for long and short-term goals.     Barriers to Discharge  Current Status/Progress Possible Resolutions Date Resolved   Physician    Medical stability;Wound Care;Weight bearing restrictions;Hemodialysis     See above  Therapies, optimize BP/DM meds      Nursing                  PT                    OT                  SLP                SW                Discharge Planning/Teaching Needs:  Plan for home with wife and 2 y.o daughter to provide 24/7 assistance.  Will plan teaching closer to d/c date.   Team Discussion:  MD  notes plan for transfusion today;  Monitoring labs;  Sitting balance is max assist with OT this morning.  Poor awareness of body in space.  Very lethargic.  Min - max with bed mobility.  Goals for supervision bed and w/c transfers.    Revisions to Treatment Plan:  NA    Continued Need for Acute Rehabilitation Level of Care: The patient requires daily medical management by a physician with specialized training in physical medicine and rehabilitation for the following conditions: Daily direction of  a multidisciplinary physical rehabilitation program to ensure safe treatment while eliciting the highest outcome that is of practical value to the patient.: Yes Daily medical management of patient stability for increased activity during participation in an intensive rehabilitation regime.: Yes Daily analysis of laboratory values and/or radiology reports with any subsequent need for medication adjustment of medical intervention for : Post surgical problems;Diabetes problems;Wound care problems;Blood pressure problems;Renal problems;Other   I attest that I was present, lead the team conference, and concur with the assessment and plan of the team.   Lennart Pall 09/21/2018, 2:12 PM   Team conference was held via web/ teleconference due to [employer] - 19

## 2018-09-21 NOTE — Progress Notes (Signed)
Social Work Patient ID: Howard Bell, male   DOB: 08-02-1965, 53 y.o.   MRN: [id number]   Have reviewed team conference with pt and left another VM for wife with targeted d/c date of 7/10 and supervision/ min assist w/c level goals overall.  Will continue to follow.  Mathias Bogacki, LCSW

## 2018-09-21 NOTE — Progress Notes (Signed)
Speech Language Pathology Daily Session Note  Patient Details  Name: Howard Bell MRN: [id number] Date of Birth: 02-11-1966  Today's Date: 09/21/2018 SLP Individual Time: 1130-1200 SLP Individual Time Calculation (min): 30 min  Short Term Goals: Week 1: SLP Short Term Goal 1 (Week 1): Pt will demonstrate sustained attention for 5 minute intervals during functional tasks with mod A verbal cues for redirection. SLP Short Term Goal 2 (Week 1): Pt will demonstrate functional problem solving for basic and familiar tasks with min A verbal cues. SLP Short Term Goal 3 (Week 1): Pt will self-monitor and self-correct functional errors with max A verbal cues. SLP Short Term Goal 4 (Week 1): Pt will utilize external memory aids to recall new, daily information with mod A verbal cues. SLP Short Term Goal 5 (Week 1): Pt will demonstrate orientation x4 with supervision A verbal cues.  Skilled Therapeutic Interventions:  Skilled treatment session focused on cognition goals. SLP facilitated session by providing Max A cues for recall of previous therapy sessions within the morning. Even when given schedule of therapy sessions, pt with no recall. Pt required Max multimodal cues to sustain attention/arousal for 5 minute increments. Pt appeared oriented to current place but then he made comments that differed and appeared to indicate that he didn't think he was in the hospital. Pt left in bed, bed alarm on and all needs within reach. Continue per current plan of care.      Pain    Therapy/Group: Individual Therapy  Eliabeth Shoff 09/21/2018, 2:38 PM

## 2018-09-21 NOTE — Progress Notes (Signed)
Occupational Therapy Note  Patient Details  Name: Howard Bell MRN: [id number] Date of Birth: [birth year]  Today's Date: 09/21/2018 OT Missed Time: 60 Minutes Missed Time Reason: Patient fatigue  Pt missed 30 mins skilled OT services.  Pt continues to exhibit increased lethargy with inability to keep eyes open to actively participate.   Leotis Shames [facility] 09/21/2018, 1:23 PM

## 2018-09-21 NOTE — Progress Notes (Signed)
Occupational Therapy Session Note  Patient Details  Name: Howard Bell MRN: [id number] Date of Birth: November 13, 1965  Today's Date: 09/21/2018 OT Individual Time: 1000-1055 OT Individual Time Calculation (min): 55 min    Short Term Goals: Week 1:  OT Short Term Goal 1 (Week 1): Pt will don shorts with mod A using lateral leans OT Short Term Goal 2 (Week 1): Pt will complete transfer to [facility] with mod A OT Short Term Goal 3 (Week 1): Pt will require no more than CGA for EOB sitting balance  Skilled Therapeutic Interventions/Progress Updates:    Pt asleep upon arrival requiring min verbal cues to arouse. Pt required max encouragement to participate in therapy.  Pt declined OOB therapy but agreeable to bediside/EOB activities. Pt requires mod A for supine>sit with max verbal cues for technique.  Petengaged in dynamic sitting activities including lateral leans while seated EOB.  Pt requires min A during lateral leans. Pt required max verbal cues to keep eyes open and frequently his eyes would close during an activity. Pt returned to supine and remained in bed with all needs within reach and bed alarm activated.    Therapy Documentation Precautions:  Precautions Precautions: Fall Precaution Comments: wound VAC surgical incision. hx of Right AKA & L AKA. 2L oxygen Restrictions Weight Bearing Restrictions: Yes RLE Weight Bearing: Non weight bearing LLE Weight Bearing: Non weight bearing  Pain:  Pt denies pain   Therapy/Group: Individual Therapy  Leroy Libman 09/21/2018, 12:07 PM

## 2018-09-21 NOTE — Progress Notes (Signed)
Put water in machine for patient and placed near bed.  Patient said he would turn machine on when he got ready to go to bed.  No distress noted, will continue to monitor.

## 2018-09-21 NOTE — Progress Notes (Signed)
Physical Therapy Session Note  Patient Details  Name: Howard Bell MRN: [id number] Date of Birth: [birth year]  Today's Date: 09/21/2018 PT Individual Time: 0800-0915 PT Individual Time Calculation (min): 75 min   Short Term Goals: Week 1:  PT Short Term Goal 1 (Week 1): Patient to demonstrate dynamic sitting balance with S.  PT Short Term Goal 2 (Week 1): pt will perform bed<>chair transfers with slideboard or A-P with min assist PT Short Term Goal 3 (Week 1): Pt will perform car transfer with min assist  Skilled Therapeutic Interventions/Progress Updates:     Patient in bed asleep upon PT arrival. Patient easily aroused to PT calling his name and agreeable to PT session. Patient was lethargic throughout session, requiring verbal and tactile stimulation to be aroused several times. PT provided the patient a cup of coffee during session, as patient stated he has a cup to wake him up in the morning. He spilled the coffee once it had cooled and required mod-max A to change his shorts during session.  Therapeutic Activity: Bed Mobility: Patient performed rolling L x2 with CGA, 1 trial with rail and 1 trial without the rail with bed flat. He went from supine to sit with mod A without rails with the bed flat. Provided verbal cues for rolling on L side, setting his L elbow and pushing up to his elbow then his hand. Transfers: Patient performed a slide board transfer x2 with min A for board placement and physical assist. Provided verbal cues for head hips relationship and board and hand placement for increased leverage to lift his bottom. Requires increased time to complete transfers.  Wheelchair Mobility:  Patient propelled wheelchair 75 feet, 50 feet and 100 feet with supervision. Provided verbal cues for use of breaks and  Therapeutic Exercise: Patient performed the following exercises with verbal and tactile cues for proper technique. -w/c push-ups 3 sets 5 reps each with education on using  for pressure relief throughout the day every 20-30 min or 2-3 times per hour. Required rest breaks between sets.  Patient in bed at end of session with breaks locked, bed alarm set, and all needs within reach.    Therapy Documentation Precautions:  Precautions Precautions: Fall Precaution Comments: wound VAC surgical incision. hx of Right AKA & L AKA. 2L oxygen Restrictions Weight Bearing Restrictions: Yes RLE Weight Bearing: Non weight bearing LLE Weight Bearing: Non weight bearing Pain: Patient denied pain throughout session.   Therapy/Group: Individual Therapy  Carleigh Buccieri L Howard Bell PT, DPT  09/21/2018, 12:58 PM

## 2018-09-21 NOTE — Progress Notes (Signed)
[facility] PHYSICAL MEDICINE & REHABILITATION PROGRESS NOTE   Subjective/Complaints: Patient seen sitting up in bed this morning.  He states he slept well overnight.  He has questions regarding discharge date.  Patient did not receive blood transfusion yesterday in HD.  ROS: Denies CP, shortness of breath, nausea, vomiting, diarrhea.  Objective:   No results found. Recent Labs    09/18/18 2129 09/20/18 1452  WBC 7.0 6.2  HGB 7.1* 7.2*  HCT 23.7* 24.0*  PLT 235 246   Recent Labs    09/18/18 2129 09/20/18 1452  NA 130* 132*  K 4.6 4.5  CL 92* 95*  CO2 24 24  GLUCOSE 111* 101*  BUN 70* 60*  CREATININE 13.05* 10.60*  CALCIUM 9.2 9.1    Intake/Output Summary (Last 24 hours) at 09/21/2018 0811 Last data filed at 09/20/2018 1750 Gross per 24 hour  Intake 120 ml  Output 1500 ml  Net -1380 ml     Physical Exam: Vital Signs Blood pressure (!) 146/76, pulse 82, temperature 97.8 F (36.6 C), temperature source Oral, resp. rate 18, height 6' (1.829 m), weight 94.7 kg, SpO2 94 %. Constitutional: No distress . Vital signs reviewed. HENT: Normocephalic.  Atraumatic. Eyes: EOMI.  No discharge. Cardiovascular: No JVD. Respiratory: Normal effort.  + [place]. GI: Non--distended. Musc: Right AKA with edema and tenderness Left AKA with edema tenderness, stable Neurological: He is alert and oriented  Motor: Bilateral UE 5/5 proximal to distal.  RLE 4+/5 hip flexion, unchanged LLE: 3+/5 hip flexion, unchanged Skin: Left AKA with dressing C/D/I Right AKA healing Psychiatric: Flat  Assessment/Plan: 1. Functional deficits secondary to left AKA which require 3+ hours per day of interdisciplinary therapy in a comprehensive inpatient rehab setting.  Physiatrist is providing close team supervision and 24 hour management of active medical problems listed below.  Physiatrist and rehab team continue to assess barriers to discharge/monitor patient progress toward functional and medical  goals  Care Tool:  Bathing    Body parts bathed by patient: Right arm, Left arm, Chest, Abdomen, Face, Front perineal area, Right upper leg, Left upper leg   Body parts bathed by helper: Buttocks Body parts n/a: Right lower leg, Left lower leg   Bathing assist Assist Level: Moderate Assistance - Patient 50 - 74%     Upper Body Dressing/Undressing Upper body dressing   What is the patient wearing?: Pull over shirt    Upper body assist Assist Level: Set up assist    Lower Body Dressing/Undressing Lower body dressing      What is the patient wearing?: Pants, Underwear/pull up     Lower body assist Assist for lower body dressing: Maximal Assistance - Patient 25 - 49%     Toileting Toileting    Toileting assist Assist for toileting: Moderate Assistance - Patient 50 - 74%     Transfers Chair/bed transfer  Transfers assist  Chair/bed transfer activity did not occur: Safety/medical concerns  Chair/bed transfer assist level: Minimal Assistance - Patient > 75% Chair/bed transfer assistive device: Sliding board   Locomotion Ambulation   Ambulation assist   Ambulation activity did not occur: N/A          Walk 10 feet activity   Assist  Walk 10 feet activity did not occur: N/A        Walk 50 feet activity   Assist           Walk 150 feet activity   Assist Walk 150 feet activity did not occur: N/A  Walk 10 feet on uneven surface  activity   Assist Walk 10 feet on uneven surfaces activity did not occur: N/A         Wheelchair     Assist Will patient use wheelchair at discharge?: Yes Type of Wheelchair: Manual    Wheelchair assist level: Supervision/Verbal cueing Max wheelchair distance: 100    Wheelchair 50 feet with 2 turns activity    Assist        Assist Level: Supervision/Verbal cueing   Wheelchair 150 feet activity     Assist     Assist Level: Supervision/Verbal cueing    Medical Problem List  and Plan: 1.Functional deficitssecondary to PAD, new left AKA (09/08/2018), recent right AKA (08/04/2018) Continue CIR 2. Antithrombotics: -DVT/anticoagulation:Pharmaceutical:Heparin -antiplatelet therapy: N/A 3. Pain Management:Oxycodone prn  Added gabapentin for phantom pain LLE, 100mg  qhs 4. Mood:LCSW to follow for evaluation and support. -antipsychotic agents: N/A 5. Neuropsych: This patientis not fullycapable of making decisions on hisown behalf. 6. Skin/Wound Care:  VAC DC'd on 6/30 7. Fluids/Electrolytes/Nutrition:Strict I/O. Daily weights. Labs with HD. 8. OSA/Chronic respiratory failure: Oxygen dependent--2 L during the day and 3 liters at nights.  Did not wear BiPaP overnight per report  Continue supplemental oxygen 9. HTN: Monitor BP TID--On Norvasc and metoprolol daily  Relatively controlled on 7/2 10. T2DM with gastroparesis and neuropathy: Brittle diabetic. Continue SSI as at home.   Reglan tid ac for gastroparesis.   CBGs changed back to AC/at bedtime  Relatively controlled on 7/2 11. Glaucoma with poor vision: On Timoptic at bedtime and Alphagan bid 12. Acute on chronic anemia: On Aranesp weekly.  Hemoglobin 7.2 on 7/1 and?  Symptomatic with lethargy and decreased activity tolerance noted by therapies.  Will transfuse today.  Monitor for signs and symptoms of reaction  Continue to monitor 13. ESRD: HD MWF.  -HD after therapy day to allow participation in rehab activities  -nephrology following, appreciate Recs    LOS: 5 days A FACE TO FACE EVALUATION WAS PERFORMED  Howard Bell Lorie Phenix 09/21/2018, 8:11 AM

## 2018-09-22 ENCOUNTER — Inpatient Hospital Stay (HOSPITAL_COMMUNITY): Payer: Medicare Other | Admitting: Speech Pathology

## 2018-09-22 ENCOUNTER — Inpatient Hospital Stay (HOSPITAL_COMMUNITY): Payer: Medicare Other

## 2018-09-22 LAB — RENAL FUNCTION PANEL
Albumin: 2.4 g/dL — ABNORMAL LOW (ref 3.5–5.0)
Anion gap: 14 (ref 5–15)
BUN: 61 mg/dL — ABNORMAL HIGH (ref 6–20)
CO2: 24 mmol/L (ref 22–32)
Calcium: 8.8 mg/dL — ABNORMAL LOW (ref 8.9–10.3)
Chloride: 93 mmol/L — ABNORMAL LOW (ref 98–111)
Creatinine, Ser: 10.09 mg/dL — ABNORMAL HIGH (ref 0.61–1.24)
GFR calc Af Amer: 6 mL/min — ABNORMAL LOW (ref >60)
GFR calc non Af Amer: 5 mL/min — ABNORMAL LOW (ref >60)
Glucose, Bld: 152 mg/dL — ABNORMAL HIGH (ref 70–99)
Phosphorus: 4.8 mg/dL — ABNORMAL HIGH (ref 2.5–4.6)
Potassium: 4.5 mmol/L (ref 3.5–5.1)
Sodium: 131 mmol/L — ABNORMAL LOW (ref 135–145)

## 2018-09-22 LAB — GLUCOSE, CAPILLARY
Glucose-Capillary: 127 mg/dL — ABNORMAL HIGH (ref 70–99)
Glucose-Capillary: 147 mg/dL — ABNORMAL HIGH (ref 70–99)
Glucose-Capillary: 139 mg/dL — ABNORMAL HIGH (ref 70–99)

## 2018-09-22 LAB — CBC
HCT: 23.1 % — ABNORMAL LOW (ref 39.0–52.0)
Hemoglobin: 7.1 g/dL — ABNORMAL LOW (ref 13.0–17.0)
MCH: 30 pg (ref 26.0–34.0)
MCHC: 30.7 g/dL (ref 30.0–36.0)
MCV: 97.5 fL (ref 80.0–100.0)
Platelets: 224 10*3/uL (ref 150–400)
RBC: 2.37 MIL/uL — ABNORMAL LOW (ref 4.22–5.81)
RDW: 17.9 % — ABNORMAL HIGH (ref 11.5–15.5)
WBC: 8 10*3/uL (ref 4.0–10.5)
nRBC: 0 % (ref 0.0–0.2)

## 2018-09-22 MED ORDER — PENTAFLUOROPROP-TETRAFLUOROETH EX AERO: 1.0000 "application " | INHALATION_SPRAY | CUTANEOUS | Status: DC | PRN

## 2018-09-22 MED ORDER — SODIUM CHLORIDE 0.9 % IV SOLN: 100.0000 mL | INTRAVENOUS | Status: DC | PRN

## 2018-09-22 MED ORDER — DARBEPOETIN ALFA 200 MCG/0.4ML IJ SOSY
PREFILLED_SYRINGE | INTRAMUSCULAR | Status: AC
  Filled 2018-09-22: qty 0.4

## 2018-09-22 MED ORDER — LIDOCAINE-PRILOCAINE 2.5-2.5 % EX CREA
1.0000 "application " | TOPICAL_CREAM | CUTANEOUS | Status: DC | PRN
  Filled 2018-09-22: qty 5

## 2018-09-22 MED ORDER — HEPARIN SODIUM (PORCINE) 1000 UNIT/ML DIALYSIS
100.0000 [IU]/kg | INTRAMUSCULAR | Status: DC | PRN
  Filled 2018-09-22: qty 10

## 2018-09-22 MED ORDER — LIDOCAINE HCL (PF) 1 % IJ SOLN: 5.0000 mL | INTRAMUSCULAR | Status: DC | PRN

## 2018-09-22 NOTE — Progress Notes (Signed)
Occupational Therapy Weekly Progress Note  Patient Details  Name: Howard Bell MRN: [id number] Date of Birth: 1965-08-31  Beginning of progress report period: September 17, 2018 End of progress report period: September 22, 2018  Patient has met 3 of 3 short term goals.  Pt continues to be lethargic and has difficulty keeping his eyes open and engaged during therapy sessions. Pt progress has been slow but he met all STG. Pt requires max verbal cues for problem solving with bed mobility.  Pt with delay in responding to requests/commands and initiating tasks.  Pt completes slide board transfers with min A and mod verbal cues for technique and safety. Pt requires mod A for lateral leans and max verbal cues for sequencing and technique.  Patient continues to demonstrate the following deficits: muscle weakness, decreased cardiorespiratoy endurance, decreased initiation, decreased attention, decreased awareness, decreased problem solving, decreased safety awareness and delayed processing and decreased sitting balance, decreased standing balance, decreased postural control and decreased balance strategies and therefore will continue to benefit from skilled OT intervention to enhance overall performance with BADL.  Patient progressing toward long term goals..  Continue plan of care.  OT Short Term Goals Week 1:  OT Short Term Goal 1 (Week 1): Pt will don shorts with mod A using lateral leans OT Short Term Goal 1 - Progress (Week 1): Met OT Short Term Goal 2 (Week 1): Pt will complete transfer to [facility] with mod A OT Short Term Goal 2 - Progress (Week 1): Met OT Short Term Goal 3 (Week 1): Pt will require no more than CGA for EOB sitting balance OT Short Term Goal 3 - Progress (Week 1): Met Week 2:  OT Short Term Goal 1 (Week 2): STG=LTG secondary to ELOS   Leroy Libman 09/22/2018, 6:56 AM

## 2018-09-22 NOTE — Progress Notes (Signed)
[facility] Progress Note   Dialysis Orders: AF MWF  4h 30min 450/800 93kg 2/2.25 bath Hep 9000 L AVG - parsabiv 10, iPTH 640 6/17  - Hb 8 at dc 6/10 on home O2 - Mircera 200 given 6/16 - tsat after last hosp d/c 37% - had been offParsabiv during recent hospitalization  Assessment/Plan: 1. Debility - on CIR 2. 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  L AKA on 09/08/2018 for multiple wounds not responding to Rx. 3. ESRD - on HD MWF.  HD today per regular schedule.  K 4.5. 4. Anemia of CKD- Hgb 7.2.  1 unit pRBC with HD today. Aranesp 216mcg qwk ordered for today as well.  Checking labs pre HD. 5. Secondary hyperparathyroidism - CCa 10.4, phos 4.9.  Stopped Ca Acetate d/t Jamas Lav, started Auryxia 1PO TID.Marland Kitchen  No parsabiv on formulary.   6. HTN/volume - Will need lower edw on d/c.  Faint crackles in b/l lung bases.  Continue to titrate down volume as tolerated.  BP in goal.  7. Nutrition - Renal diet with fluid restrictions, vit, protein supplements.  8. DM - per primary  Jen Mow, PA-C [facility]  Pager: [phone] 09/22/2018,9:08 AM  LOS: 6 days   Subjective:   Patient seen and examined in room.  Sitting in bedside chair.  Denies SOB, SP and edema.  No new complaints.   Objective Vitals:   09/21/18 0634 09/21/18 1416 09/21/18 1950 09/22/18 0313  BP: (!) 146/76 113/79 (!) 149/78 (!) 146/80  Pulse: 82 82 83 85  Resp: 18 18 18 15   Temp: 97.8 F (36.6 C) 98.7 F (37.1 C) 98 F (36.7 C) 98 F (36.7 C)  TempSrc: Oral Oral Oral Oral  SpO2: 94% 94% 94% 97%  Weight: 94.7 kg   91.2 kg  Height:       Physical Exam General:NAD, chronically ill appearing male, sitting in chair Heart:RRR, no mrg Lungs:+crackles in b/l bases Abdomen:soft, ntnd, +BS Extremities:  B/l AKA - no stump edema Dialysis Access: LU AVG +b/t   Filed Weights   09/20/18 1750 09/21/18 0634  09/22/18 0313  Weight: 93 kg 94.7 kg 91.2 kg    Intake/Output Summary (Last 24 hours) at 09/22/2018 0908 Last data filed at 09/21/2018 1822 Gross per 24 hour  Intake 531 ml  Output -  Net 531 ml    Additional Objective Labs: Basic Metabolic Panel: Recent Labs  Lab 09/17/18 0759 09/18/18 2129 09/20/18 1452  NA 133* 130* 132*  K 3.8 4.6 4.5  CL 93* 92* 95*  CO2 28 24 24   GLUCOSE 183* 111* 101*  BUN 52* 70* 60*  CREATININE 10.34* 13.05* 10.60*  CALCIUM 9.2 9.2 9.1  PHOS  --  6.2* 4.9*   Liver Function Tests: Recent Labs  Lab 09/17/18 0759 09/18/18 2129 09/20/18 1452  AST 13*  --   --   ALT 6  --   --   ALKPHOS 99  --   --   BILITOT 0.5  --   --   PROT 7.4  --   --   ALBUMIN 2.4* 2.2* 2.4*   CBC: Recent Labs  Lab 09/18/18 2129 09/20/18 1452  WBC 7.0 6.2  HGB 7.1* 7.2*  HCT 23.7* 24.0*  MCV 96.0 96.8  PLT 235 246   Blood Culture    Component Value Date/Time   SDES BLOOD LEFT ANTECUBITAL 08/15/2018 1526  SPECREQUEST  08/15/2018 1526    BOTTLES DRAWN AEROBIC ONLY Blood Culture adequate volume   CULT  08/15/2018 1526    NO GROWTH 5 DAYS Performed at [facility], [place] [address] [place], [place] [address]    REPTSTATUS 08/20/2018 FINAL 08/15/2018 1526   CBG: Recent Labs  Lab 09/21/18 0632 09/21/18 1132 09/21/18 1642 09/21/18 2102 09/22/18 0644  GLUCAP 129* 150* 129* 183* 127*  Studies/Results: No results found.  Medications: . ferric gluconate (FERRLECIT/NULECIT) IV 62.5 mg (09/20/18 1628)   . sodium chloride   Intravenous Once  . amLODipine  10 mg Oral Daily  . brimonidine  1 drop Both Eyes BID  . chlorhexidine  15 mL Mouth Rinse BID  . Chlorhexidine Gluconate Cloth  6 each Topical Q0600  . darbepoetin (ARANESP) injection - DIALYSIS  200 mcg Intravenous Q Fri-HD  . docusate sodium  100 mg Oral BID  . feeding supplement (NEPRO CARB STEADY)  237 mL Oral Q24H  . feeding supplement (PRO-STAT SUGAR FREE 64)  30 mL Oral BID  . ferric  citrate  210 mg Oral TID WC  . fluticasone  1 spray Each Nare Daily  . gabapentin  100 mg Oral QHS  . heparin  5,000 Units Subcutaneous Q8H  . insulin aspart  0-5 Units Subcutaneous QHS  . insulin aspart  0-9 Units Subcutaneous TID WC  . metoCLOPramide  5 mg Oral TID AC  . metoprolol succinate  50 mg Oral Daily  . multivitamin  1 tablet Oral QHS  . mupirocin ointment   Nasal BID  . pantoprazole  40 mg Oral Daily  . timolol  1 drop Both Eyes BID

## 2018-09-22 NOTE — Progress Notes (Addendum)
Physical Therapy Session Note  Patient Details  Name: Howard Bell MRN: [id number] Date of Birth: 07/22/1965  Today's Date: 09/22/2018 PT Individual Time: 1115-1230 PT Individual Time Calculation (min): 75 min   Short Term Goals: Week 1:  PT Short Term Goal 1 (Week 1): Patient to demonstrate dynamic sitting balance with S.  PT Short Term Goal 2 (Week 1): pt will perform bed<>chair transfers with slideboard or A-P with min assist PT Short Term Goal 3 (Week 1): Pt will perform car transfer with min assist  Skilled Therapeutic Interventions/Progress Updates:     Patient in bed upon PT arrival. Patient alert and agreeable to PT session. He remained more alert throughout session today and affect improved and joking with therapist throughout session. Remained on 2L O2 throughout session. PT wrapped B LEs with one 4" ACE wrap, able to wrap more securely with shorts off today.   Therapeutic Activity: Bed Mobility: Patient performed rolling R and L without rails x2 with CGA-min A. He performed supine to sit with mod A in a flat bed without rails. Provided verbal cues for rolling to push through UEs to sit up. Transfers: Patient performed a slide board transfer x1 with mod-min A and total A for board placment. Provided verbal cues for board placement, head-hips relationship, hand placement, and increased clearance of his buttocks by pushing through his UEs. Patient discussed fear of falling when sitting close to the EOB, PT educated on center of mass and core muscle activation to sit closer to the EOB for improved set up for slide board transfers. Patient required max cues and comforting from PT to scoot to the EOB.  Wheelchair Mobility:  Patient propelled wheelchair 100 feet x2 with supervision and increased time. Provided verbal cues for larger arm strokes for improved propulsion.  He propelled the w/c up/down a ramp x2 with mod-min A with cues to lean forward when going up and backwards when  going down.   Therapeutic Exercise: Patient performed the following exercises with verbal and tactile cues for proper technique. -B SLR x10 -B glut sets with 5 sec hold x10 -B isometric adductor holds x10 Attempted R and L side-lying position for hip adduction, patient has difficulty maintaining side-lying and reported discomfort in this position  Patient in w/c at end of session with breaks locked, seat belt alarm set, and all needs within reach.    Therapy Documentation Precautions:  Precautions Precautions: Fall Precaution Comments: wound VAC surgical incision. hx of Right AKA & L AKA. 2L oxygen Restrictions Weight Bearing Restrictions: Yes RLE Weight Bearing: Non weight bearing LLE Weight Bearing: Non weight bearing Pain: Pain Assessment Pain Score: 2/10 Pain  Location: L residual limb (ankle) Pain type: constant; phantom pain Pain interventions: reposition; distraction  Therapy/Group: Individual Therapy  Raigan Baria L Azalie Harbeck PT, DPT  09/22/2018, 4:00 PM

## 2018-09-22 NOTE — Progress Notes (Signed)
Put water in machine and made sure it was plugged up and ready to go when the patient decided to go to bed.  Placed [place] back on patient at patient's request for now.  Patient usually self manages machine.  Told patient to have RN hook up O2 line to meter when ready to go on.  Will continue to monitor.

## 2018-09-22 NOTE — Progress Notes (Signed)
Pulmonary Lab notified regarding PFT order.

## 2018-09-22 NOTE — Progress Notes (Addendum)
Speech Language Pathology Daily Session Note  Patient Details  Name: Howard Bell MRN: [id number] Date of Birth: 1965-05-13  Today's Date: 09/22/2018 SLP Individual Time: 8867-7373 SLP Individual Time Calculation (min): 45 min  Short Term Goals: Week 1: SLP Short Term Goal 1 (Week 1): Pt will demonstrate sustained attention for 5 minute intervals during functional tasks with mod A verbal cues for redirection. SLP Short Term Goal 2 (Week 1): Pt will demonstrate functional problem solving for basic and familiar tasks with min A verbal cues. SLP Short Term Goal 3 (Week 1): Pt will self-monitor and self-correct functional errors with max A verbal cues. SLP Short Term Goal 4 (Week 1): Pt will utilize external memory aids to recall new, daily information with mod A verbal cues. SLP Short Term Goal 5 (Week 1): Pt will demonstrate orientation x4 with supervision A verbal cues.  Skilled Therapeutic Interventions: Patient received skilled SLP services targeting cognitive goals. Patient participated in attention task sorting a deck of cards by color with 100% accuracy requiring max verbal cues. Patient then sorted the deck of cards by number with 75% accuracy requiring max verbal cues to attend to task. Patient completed basic math calculations by adding and subtracting dollars and coins with 50% accuracy requiring max verbal cues. Patient required max verbal cues to correctly utilize his therapy schedule to recall what times his therapy are today. At the end of therapy session patient was upright in chair, chair alarm activated, and all needs within reach.  Pain Pain Assessment Pain Scale: 0-10 Pain Score: 0-No pain  Therapy/Group: Individual Therapy  Cristy Folks 09/22/2018, 10:43 AM

## 2018-09-22 NOTE — Progress Notes (Signed)
[facility] PHYSICAL MEDICINE & REHABILITATION PROGRESS NOTE   Subjective/Complaints: Patient seen laying in bed this morning.  He states he slept well overnight.  He states he wore his BiPAP overnight.  ROS: Denies CP, shortness of breath, nausea, vomiting, diarrhea.  Objective:   No results found. Recent Labs    09/20/18 1452  WBC 6.2  HGB 7.2*  HCT 24.0*  PLT 246   Recent Labs    09/20/18 1452  NA 132*  K 4.5  CL 95*  CO2 24  GLUCOSE 101*  BUN 60*  CREATININE 10.60*  CALCIUM 9.1    Intake/Output Summary (Last 24 hours) at 09/22/2018 0846 Last data filed at 09/22/2018 0846 Gross per 24 hour  Intake 771 ml  Output -  Net 771 ml     Physical Exam: Vital Signs Blood pressure (!) 146/80, pulse 85, temperature 98 F (36.7 C), temperature source Oral, resp. rate 15, height 6' (1.829 m), weight 91.2 kg, SpO2 97 %. Constitutional: No distress . Vital signs reviewed. HENT: Normocephalic.  Atraumatic. Eyes: EOMI.  No discharge. Cardiovascular: No JVD. Respiratory: Normal effort.  GI: Non--distended. Musc: Right AKA with edema and tenderness Left AKA with edema tenderness, unchanged Neurological: He is alert Motor: Bilateral UE 5/5 proximal to distal.  RLE 4+/5 hip flexion, stable LLE: 3+/5 hip flexion, improving Skin: Left AKA with dressing C/D/I Right AKA with dressing C/D/I Psychiatric: Flat  Assessment/Plan: 1. Functional deficits secondary to left AKA which require 3+ hours per day of interdisciplinary therapy in a comprehensive inpatient rehab setting.  Physiatrist is providing close team supervision and 24 hour management of active medical problems listed below.  Physiatrist and rehab team continue to assess barriers to discharge/monitor patient progress toward functional and medical goals  Care Tool:  Bathing    Body parts bathed by patient: Right arm, Left arm, Chest, Abdomen, Face, Front perineal area, Right upper leg, Left upper leg   Body parts  bathed by helper: Buttocks Body parts n/a: Right lower leg, Left lower leg   Bathing assist Assist Level: Moderate Assistance - Patient 50 - 74%     Upper Body Dressing/Undressing Upper body dressing   What is the patient wearing?: Pull over shirt    Upper body assist Assist Level: Set up assist    Lower Body Dressing/Undressing Lower body dressing      What is the patient wearing?: Pants, Underwear/pull up     Lower body assist Assist for lower body dressing: Maximal Assistance - Patient 25 - 49%     Toileting Toileting    Toileting assist Assist for toileting: Moderate Assistance - Patient 50 - 74%     Transfers Chair/bed transfer  Transfers assist  Chair/bed transfer activity did not occur: Safety/medical concerns  Chair/bed transfer assist level: Moderate Assistance - Patient 50 - 74% Chair/bed transfer assistive device: Sliding board   Locomotion Ambulation   Ambulation assist   Ambulation activity did not occur: N/A          Walk 10 feet activity   Assist  Walk 10 feet activity did not occur: N/A        Walk 50 feet activity   Assist           Walk 150 feet activity   Assist Walk 150 feet activity did not occur: N/A         Walk 10 feet on uneven surface  activity   Assist Walk 10 feet on uneven surfaces activity did not occur:  N/A         Wheelchair     Assist Will patient use wheelchair at discharge?: Yes Type of Wheelchair: Manual    Wheelchair assist level: Supervision/Verbal cueing Max wheelchair distance: 100'    Wheelchair 50 feet with 2 turns activity    Assist        Assist Level: Supervision/Verbal cueing   Wheelchair 150 feet activity     Assist     Assist Level: Supervision/Verbal cueing    Medical Problem List and Plan: 1.Functional deficitssecondary to PAD, new left AKA (09/08/2018), recent right AKA (08/04/2018) Continue CIR 2.  Antithrombotics: -DVT/anticoagulation:Pharmaceutical:Heparin -antiplatelet therapy: N/A 3. Pain Management:Oxycodone prn  Added gabapentin for phantom pain LLE, 100mg  qhs  Controlled on 7/3 4. Mood:LCSW to follow for evaluation and support. -antipsychotic agents: N/A 5. Neuropsych: This patientis not fullycapable of making decisions on hisown behalf. 6. Skin/Wound Care:  VAC DC'd on 6/30 7. Fluids/Electrolytes/Nutrition:Strict I/O. Daily weights. Labs with HD. 8. OSA/Chronic respiratory failure: Oxygen dependent--2 L during the day and 3 liters at nights.  Patient states he wore BiPAP overnight  Continue supplemental oxygen 9. HTN: Monitor BP TID--On Norvasc and metoprolol daily  Relatively controlled on 7/2 10. T2DM with gastroparesis and neuropathy: Brittle diabetic. Continue SSI as at home.   Reglan tid ac for gastroparesis.   CBGs changed back to AC/at bedtime  Labile on 7/3 11. Glaucoma with poor vision: On Timoptic at bedtime and Alphagan bid 12. Acute on chronic anemia: On Aranesp weekly.  Hemoglobin 7.2 on 7/1 ?  Plans for transfusion HD today. Discussed with nursing  Continue to monitor 13. ESRD: HD MWF.  -HD after therapy day to allow participation in rehab activities  -nephrology following, appreciate Recs    LOS: 6 days A FACE TO Bruce 09/22/2018, 8:46 AM

## 2018-09-22 NOTE — Progress Notes (Signed)
Occupational Therapy Session Note  Patient Details  Name: Howard Bell MRN: [id number] Date of Birth: 02-23-1966  Today's Date: 09/22/2018 OT Individual Time: 0962-8366 OT Individual Time Calculation (min): 55 min    Short Term Goals: Week 2:  OT Short Term Goal 1 (Week 2): STG=LTG secondary to ELOS  Skilled Therapeutic Interventions/Progress Updates:    Pt sitting in bed upon arrival and agreeable to therapy.  Nasal canula on bed beside pt.  Pt stated he thought he had his oxygen on.  O2 sats after 2L O2 on at 94%. OT intervention with focus on bed mobility, slide board transfers, sitting balance, w/c mobility, and activity tolerance.  Pt continues to exhibit increased lethargy and requires mod verbal cues to keep his eyes open and engage.  Pt with delayed problem solving strategies as noted during bed mobility activities/tasks.  Pt unable/resistant to position close to EOB in preparation for positioning of slide board. Pt required to boost onto slide board as a result.  Slide board transfer with min A and min verbal cues for technique and safety.  Pt slow to respond to all requests and initiate tasks.  Pt remained in w/c with all needs within reach and belt alarm activated.  Pt declined eating breakfast, stating that his stomach hurt.   Therapy Documentation Precautions:  Precautions Precautions: Fall Precaution Comments: wound VAC surgical incision. hx of Right AKA & L AKA. 2L oxygen Restrictions Weight Bearing Restrictions: Yes RLE Weight Bearing: Non weight bearing LLE Weight Bearing: Non weight bearing  Pain: Pt c/o 1/10 pain in LLE; repositioned  Therapy/Group: Individual Therapy  Leroy Libman 09/22/2018, 11:47 AM

## 2018-09-23 ENCOUNTER — Inpatient Hospital Stay (HOSPITAL_COMMUNITY): Payer: Medicare Other

## 2018-09-23 DIAGNOSIS — G8918 Other acute postprocedural pain: Secondary | ICD-10-CM

## 2018-09-23 LAB — GLUCOSE, CAPILLARY
Glucose-Capillary: 105 mg/dL — ABNORMAL HIGH (ref 70–99)
Glucose-Capillary: 148 mg/dL — ABNORMAL HIGH (ref 70–99)
Glucose-Capillary: 130 mg/dL — ABNORMAL HIGH (ref 70–99)
Glucose-Capillary: 123 mg/dL — ABNORMAL HIGH (ref 70–99)

## 2018-09-23 NOTE — Progress Notes (Signed)
[facility] Progress Note   Subjective:   Patient seen and examined in room.  Sitting in wheelchair.  No new complaints.  Denies SOB, CP, dizziness, weakness and fatigue.   Objective Vitals:   09/22/18 1934 09/23/18 0500 09/23/18 0645 09/23/18 0838  BP: 138/74  132/69 130/74  Pulse: 91  87 85  Resp: 18     Temp:   98.1 F (36.7 C)   TempSrc:   Oral   SpO2: 91%  (!) 89%   Weight:  88.8 kg    Height:       Physical Exam General:NAD, chronically ill appearing male, sitting in wheelchair Heart:RRR, no mrg Lungs:mostly CTAB Abdomen:soft, NTND, +BS Extremities:b/l AKA - no stump edema Dialysis Access: LU AVG +b/t   Filed Weights   09/22/18 1510 09/22/18 1818 09/23/18 0500  Weight: 91.3 kg 88.6 kg 88.8 kg    Intake/Output Summary (Last 24 hours) at 09/23/2018 1317 Last data filed at 09/23/2018 0800 Gross per 24 hour  Intake 240 ml  Output 2500 ml  Net -2260 ml    Additional Objective Labs: Basic Metabolic Panel: Recent Labs  Lab 09/18/18 2129 09/20/18 1452 09/22/18 1536  NA 130* 132* 131*  K 4.6 4.5 4.5  CL 92* 95* 93*  CO2 24 24 24   GLUCOSE 111* 101* 152*  BUN 70* 60* 61*  CREATININE 13.05* 10.60* 10.09*  CALCIUM 9.2 9.1 8.8*  PHOS 6.2* 4.9* 4.8*   Liver Function Tests: Recent Labs  Lab 09/17/18 0759 09/18/18 2129 09/20/18 1452 09/22/18 1536  AST 13*  --   --   --   ALT 6  --   --   --   ALKPHOS 99  --   --   --   BILITOT 0.5  --   --   --   PROT 7.4  --   --   --   ALBUMIN 2.4* 2.2* 2.4* 2.4*   CBC: Recent Labs  Lab 09/18/18 2129 09/20/18 1452 09/22/18 1537  WBC 7.0 6.2 8.0  HGB 7.1* 7.2* 7.1*  HCT 23.7* 24.0* 23.1*  MCV 96.0 96.8 97.5  PLT 235 246 224   Recent Labs  Lab 09/22/18 0644 09/22/18 1123 09/22/18 2115 09/23/18 0641 09/23/18 1156  GLUCAP 127* 147* 139* 105* 148*  Medications: . ferric gluconate (FERRLECIT/NULECIT) IV 62.5 mg (09/20/18 1628)   . sodium chloride   Intravenous Once  . amLODipine  10 mg Oral  Daily  . brimonidine  1 drop Both Eyes BID  . chlorhexidine  15 mL Mouth Rinse BID  . Chlorhexidine Gluconate Cloth  6 each Topical Q0600  . darbepoetin (ARANESP) injection - DIALYSIS  200 mcg Intravenous Q Fri-HD  . docusate sodium  100 mg Oral BID  . feeding supplement (NEPRO CARB STEADY)  237 mL Oral Q24H  . feeding supplement (PRO-STAT SUGAR FREE 64)  30 mL Oral BID  . ferric citrate  210 mg Oral TID WC  . fluticasone  1 spray Each Nare Daily  . gabapentin  100 mg Oral QHS  . heparin  5,000 Units Subcutaneous Q8H  . insulin aspart  0-5 Units Subcutaneous QHS  . insulin aspart  0-9 Units Subcutaneous TID WC  . metoCLOPramide  5 mg Oral TID AC  . metoprolol succinate  50 mg Oral Daily  . multivitamin  1 tablet Oral QHS  . mupirocin ointment   Nasal BID  . pantoprazole  40 mg Oral Daily  . timolol  1 drop Both Eyes BID  Dialysis Orders: AF MWF  4h 14min 450/800 93kg 2/2.25 bath Hep 9000 L AVG - parsabiv 10, iPTH 640 6/17  - Hb 8 at dc 6/10 on home O2 - Mircera 200 given 6/16 - tsat after last hosp d/c 37% - had been offParsabiv during recent hospitalization  Assessment/Plan: 1. Debility - on CIR 2. 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  L AKA on 09/08/2018 for multiple wounds not responding to Rx. 3. ESRD - on HD MWF.  HD yesterday per regular schedule. last K 4.5.  Next HD 7/6. 4. Anemia of CKD- Hgb stable at 7.1. Blood not given with HD yesterday d/t Hgb>7.  Recheck in AM, transfuse as indicated. Aranesp 216mcg qwk given 7/3.   5. Secondary hyperparathyroidism - CCa 10.4, phos 4.8.  Stopped Ca Acetate d/t Jamas Lav, started Auryxia 1PO TID.  No parsabiv on formulary in hospital.   6. HTN/volume - Will need lower edw on d/c. Last weight 88.8kg  Continue to titrate down volume as tolerated.  BP in goal.  7. Nutrition - Renal diet with fluid restrictions, vit, protein supplements.  8. DM - per  primary  Jen Mow, PA-C [facility] Pager: [phone] 09/23/2018,1:17 PM  LOS: 7 days

## 2018-09-23 NOTE — Progress Notes (Signed)
Cleansed diabetic ulcer on R 5th knuckle with soap and water. Patient refused to allow dressing to be applied as ordered.

## 2018-09-23 NOTE — Progress Notes (Signed)
Occupational Therapy Session Note  Patient Details  Name: Howard Bell MRN: [id number] Date of Birth: January 09, 1966  Today's Date: 09/23/2018 OT Individual Time: 0160-1093 OT Individual Time Calculation (min): 45 min    Short Term Goals: Week 2:  OT Short Term Goal 1 (Week 2): STG=LTG secondary to ELOS  Skilled Therapeutic Interventions/Progress Updates:    Pt resting in bed upon arrival watching TV. Pt required min encouragement to engage in therapy and initially reluctant to transfer to w/c.  Attempted to educate pt in wrapping BLE with ace wraps but pt with difficulty sustaining attention.  Pt required max verbal cues to position BLE appropriately to facilitate wrapping.  Pt required max A for supine>sit EOB with HOB elevated and max verbal cues for sequencing and technique.  Pt completed slide board transfer to w/c with CGA after board placement. Min verbal cues for pt to reposition in w/c Pt remained seated in w/c with belt alarm activated and all needs within reach.   Therapy Documentation Precautions:  Precautions Precautions: Fall Precaution Comments: wound VAC surgical incision. hx of Right AKA & L AKA. 2L oxygen Restrictions Weight Bearing Restrictions: Yes RLE Weight Bearing: Non weight bearing LLE Weight Bearing: Non weight bearing Pain: Pain Assessment Pain Scale: 0-10 Pain Score: 0-No pain   Therapy/Group: Individual Therapy  Leroy Libman 09/23/2018, 11:17 AM

## 2018-09-23 NOTE — Progress Notes (Signed)
Physical Therapy Weekly Progress Note  Patient Details  Name: Howard Bell MRN: [id number] Date of Birth: [birth year]  Beginning of progress report period: September 17, 2018 End of progress report period: September 23, 2018   Patient has met 0 of 3 short term goals.  Patient with slow progress toward STGs, limited by pain, lethargy, and decreased strength and activity tolerance this week. He currently requires mod A for bed mobility and transfers and limits dynamic sitting balance to within BOS, limited by fear of falling. He propels the w/c 100' with supervision and has initiated HEP.   Patient continues to demonstrate the following deficits muscle weakness and muscle joint tightness, decreased cardiorespiratoy endurance and decreased oxygen support and decreased sitting balance, decreased postural control, decreased balance strategies and difficulty maintaining precautions and therefore will continue to benefit from skilled PT intervention to increase functional independence with mobility.  Patient progressing toward long term goals..  Continue plan of care.  PT Short Term Goals Week 1:  PT Short Term Goal 1 (Week 1): Patient to demonstrate dynamic sitting balance with S.  PT Short Term Goal 1 - Progress (Week 1): Progressing toward goal PT Short Term Goal 2 (Week 1): pt will perform bed<>chair transfers with slideboard or A-P with min assist PT Short Term Goal 2 - Progress (Week 1): Progressing toward goal PT Short Term Goal 3 (Week 1): Pt will perform car transfer with min assist PT Short Term Goal 3 - Progress (Week 1): Progressing toward goal Week 2:  PT Short Term Goal 1 (Week 2): Patient will perform bed mobility with min A without hospital bed functions. PT Short Term Goal 2 (Week 2): Patient will perform basic transfers with min A consistently. PT Short Term Goal 3 (Week 2): Patient will propel w/c 150 feet with supervison. PT Short Term Goal 4 (Week 2): Patient wil perform car transfers  with min A.   Therapy Documentation Precautions:  Precautions Precautions: Fall Precaution Comments: wound VAC surgical incision. hx of Right AKA & L AKA. 2L oxygen Restrictions Weight Bearing Restrictions: Yes RLE Weight Bearing: Non weight bearing LLE Weight Bearing: Non weight bearing   Aveena Bari L Fenix Rorke PT, DPT  09/23/2018, 7:33 AM

## 2018-09-23 NOTE — Progress Notes (Signed)
[place] PHYSICAL MEDICINE & REHABILITATION PROGRESS NOTE   Subjective/Complaints: Patient seen laying in bed this morning.  He states he slept well overnight.  He denies complaints.  ROS: Denies CP, shortness of breath, nausea, vomiting, diarrhea.  Objective:   No results found. Recent Labs    09/20/18 1452 09/22/18 1537  WBC 6.2 8.0  HGB 7.2* 7.1*  HCT 24.0* 23.1*  PLT 246 224   Recent Labs    09/20/18 1452 09/22/18 1536  NA 132* 131*  K 4.5 4.5  CL 95* 93*  CO2 24 24  GLUCOSE 101* 152*  BUN 60* 61*  CREATININE 10.60* 10.09*  CALCIUM 9.1 8.8*    Intake/Output Summary (Last 24 hours) at 09/23/2018 1420 Last data filed at 09/23/2018 0800 Gross per 24 hour  Intake 120 ml  Output 2500 ml  Net -2380 ml     Physical Exam: Vital Signs Blood pressure (!) 121/58, pulse 80, temperature 98.1 F (36.7 C), resp. rate 18, height 6' (1.829 m), weight 88.8 kg, SpO2 96 %. Constitutional: No distress . Vital signs reviewed. HENT: Normocephalic.  Atraumatic. Eyes: EOMI.  No discharge. Cardiovascular: No JVD. Respiratory: Normal effort.  GI: Non-distended. Musc: Right AKA with edema and tenderness Left AKA with edema tenderness, stable Neurological: He is alert Motor: Bilateral UE 5/5 proximal to distal.  RLE 4+/5 hip flexion, unchanged LLE: 3+/5 hip flexion, unchanged Skin: Left AKA with dressing C/D/I Right AKA with dressing C/D/I Psychiatric: Flat  Assessment/Plan: 1. Functional deficits secondary to left AKA which require 3+ hours per day of interdisciplinary therapy in a comprehensive inpatient rehab setting.  Physiatrist is providing close team supervision and 24 hour management of active medical problems listed below.  Physiatrist and rehab team continue to assess barriers to discharge/monitor patient progress toward functional and medical goals  Care Tool:  Bathing    Body parts bathed by patient: Right arm, Left arm, Chest, Abdomen, Face, Front  perineal area, Right upper leg, Left upper leg   Body parts bathed by helper: Buttocks Body parts n/a: Right lower leg, Left lower leg   Bathing assist Assist Level: Moderate Assistance - Patient 50 - 74%     Upper Body Dressing/Undressing Upper body dressing   What is the patient wearing?: Pull over shirt    Upper body assist Assist Level: Set up assist    Lower Body Dressing/Undressing Lower body dressing      What is the patient wearing?: Pants, Underwear/pull up     Lower body assist Assist for lower body dressing: Maximal Assistance - Patient 25 - 49%     Toileting Toileting    Toileting assist Assist for toileting: Moderate Assistance - Patient 50 - 74%     Transfers Chair/bed transfer  Transfers assist  Chair/bed transfer activity did not occur: Safety/medical concerns  Chair/bed transfer assist level: Moderate Assistance - Patient 50 - 74% Chair/bed transfer assistive device: Sliding board   Locomotion Ambulation   Ambulation assist   Ambulation activity did not occur: N/A          Walk 10 feet activity   Assist  Walk 10 feet activity did not occur: N/A        Walk 50 feet activity   Assist           Walk 150 feet activity   Assist Walk 150 feet activity did not occur: N/A         Walk 10 feet on uneven surface  activity   Assist  Walk 10 feet on uneven surfaces activity did not occur: N/A         Wheelchair     Assist Will patient use wheelchair at discharge?: Yes Type of Wheelchair: Manual    Wheelchair assist level: Supervision/Verbal cueing, Set up assist Max wheelchair distance: 100'    Wheelchair 50 feet with 2 turns activity    Assist        Assist Level: Supervision/Verbal cueing, Set up assist   Wheelchair 150 feet activity     Assist     Assist Level: Supervision/Verbal cueing    Medical Problem List and Plan: 1.Functional deficitssecondary to PAD, new left AKA (09/08/2018),  recent right AKA (08/04/2018) Continue CIR 2. Antithrombotics: -DVT/anticoagulation:Pharmaceutical:Heparin -antiplatelet therapy: N/A 3. Pain Management:Oxycodone prn  Added gabapentin for phantom pain LLE, 100mg  qhs  Controlled on 7/4 4. Mood:LCSW to follow for evaluation and support. -antipsychotic agents: N/A 5. Neuropsych: This patientis not fullycapable of making decisions on hisown behalf. 6. Skin/Wound Care:  VAC DC'd on 6/30 7. Fluids/Electrolytes/Nutrition:Strict I/O. Daily weights. Labs with HD. 8. OSA/Chronic respiratory failure: Oxygen dependent--2 L during the day and 3 liters at nights.  Patient states he wore BiPAP overnight  Continue supplemental oxygen 9. HTN: Monitor BP TID--On Norvasc and metoprolol daily  Relatively controlled on 2/4 10. T2DM with gastroparesis and neuropathy: Brittle diabetic. Continue SSI as at home.   Reglan tid ac for gastroparesis.   CBGs changed back to AC/at bedtime  Labile on 7/4 11. Glaucoma with poor vision: On Timoptic at bedtime and Alphagan bid 12. Acute on chronic anemia: On Aranesp weekly.  Hemoglobin 7.1 on 7/3  Continue to monitor 13.  ESRD: HD MWF.  -HD after therapy day to allow participation in rehab activities  -nephrology following, appreciate Recs    LOS: 7 days A FACE TO FACE EVALUATION WAS PERFORMED  Ankit Lorie Phenix 09/23/2018, 2:20 PM

## 2018-09-23 NOTE — Progress Notes (Signed)
Speech Language Pathology Daily Session Note  Patient Details  Name: Howard Bell MRN: [id number] Date of Birth: 1966/02/20  Today's Date: 09/23/2018 SLP Individual Time: 0903-0930 SLP Individual Time Calculation (min): 27 min  Short Term Goals: Week 1: SLP Short Term Goal 1 (Week 1): Pt will demonstrate sustained attention for 5 minute intervals during functional tasks with mod A verbal cues for redirection. SLP Short Term Goal 2 (Week 1): Pt will demonstrate functional problem solving for basic and familiar tasks with min A verbal cues. SLP Short Term Goal 3 (Week 1): Pt will self-monitor and self-correct functional errors with max A verbal cues. SLP Short Term Goal 4 (Week 1): Pt will utilize external memory aids to recall new, daily information with mod A verbal cues. SLP Short Term Goal 5 (Week 1): Pt will demonstrate orientation x4 with supervision A verbal cues.  Skilled Therapeutic Interventions:Skilled ST services focused on cognitive skills. Pt required cues only for month and day of week given orientation questions. SLP facilitated basic problem solving and sustained attention in card sorting task by shape, pt required min A cues for problem solving/error awareness and sustained attention for 10 minute interval. Pt was left room with bed alarm set. ST recommends continued services.      Pain Pain Assessment Pain Scale: 0-10 Pain Score: 0-No pain  Therapy/Group: Individual Therapy    Shant Hence  [facility] 09/23/2018, 9:17 AM

## 2018-09-24 ENCOUNTER — Inpatient Hospital Stay (HOSPITAL_COMMUNITY): Payer: Medicare Other

## 2018-09-24 DIAGNOSIS — L98499 Non-pressure chronic ulcer of skin of other sites with unspecified severity: Secondary | ICD-10-CM

## 2018-09-24 DIAGNOSIS — R0989 Other specified symptoms and signs involving the circulatory and respiratory systems: Secondary | ICD-10-CM

## 2018-09-24 LAB — TYPE AND SCREEN
ABO/RH(D): A POS
Antibody Screen: NEGATIVE
Unit division: 0

## 2018-09-24 LAB — BPAM RBC
Blood Product Expiration Date: 202007312359
Unit Type and Rh: 6200

## 2018-09-24 LAB — CBC
HCT: 23.3 % — ABNORMAL LOW (ref 39.0–52.0)
Hemoglobin: 7.2 g/dL — ABNORMAL LOW (ref 13.0–17.0)
MCH: 30.5 pg (ref 26.0–34.0)
MCHC: 30.9 g/dL (ref 30.0–36.0)
MCV: 98.7 fL (ref 80.0–100.0)
Platelets: 201 K/uL (ref 150–400)
RBC: 2.36 MIL/uL — ABNORMAL LOW (ref 4.22–5.81)
RDW: 17.8 % — ABNORMAL HIGH (ref 11.5–15.5)
WBC: 5.9 K/uL (ref 4.0–10.5)
nRBC: 0 % (ref 0.0–0.2)

## 2018-09-24 LAB — GLUCOSE, CAPILLARY
Glucose-Capillary: 125 mg/dL — ABNORMAL HIGH (ref 70–99)
Glucose-Capillary: 137 mg/dL — ABNORMAL HIGH (ref 70–99)
Glucose-Capillary: 140 mg/dL — ABNORMAL HIGH (ref 70–99)
Glucose-Capillary: 149 mg/dL — ABNORMAL HIGH (ref 70–99)

## 2018-09-24 MED ORDER — CHLORHEXIDINE GLUCONATE CLOTH 2 % EX PADS
6.0000 | MEDICATED_PAD | Freq: Every day | CUTANEOUS | Status: DC
  Administered 2018-09-27 – 2018-09-29 (×3): 6 via TOPICAL

## 2018-09-24 NOTE — Progress Notes (Signed)
Physical Therapy Session Note  Patient Details  Name: Howard Bell MRN: [id number] Date of Birth: 30-Jan-1966  Today's Date: 09/24/2018 PT Individual Time: 0930-1020 PT Individual Time Calculation (min): 50 min   Short Term Goals: Week 1:  PT Short Term Goal 1 (Week 2): Patient will perform bed mobility with min A without hospital bed functions. PT Short Term Goal 2 (Week 2): Patient will perform basic transfers with min A consistently. PT Short Term Goal 3 (Week 2): Patient will propel w/c 150 feet with supervison. PT Short Term Goal 4 (Week 2): Patient wil perform car transfers with min A.  Skilled Therapeutic Interventions/Progress Updates:   Patient in supine with ace wraps loose around residual limbs.  Re-wrapped with two 4" ace wraps to bilateral AKA residual limbs and placed shrinker over the ace wraps on L LE to secure them better.  Patient supine to sit with elevated HOB and CGA.  SBT to w/c with min a after board placement and w/c set up.  Patient propelled in w/c x 100' with S increased time, cues for safety and set up for transfers.  W/c to mat with SBT and min A.  Seated on mat for balance activity reaching and tossing horseshoes with S.  Patient sit to L sidelying with S.  Hip extension and abduction x 10 on R.  To supine with S and performed SLR bilateral x 10, assist and max cues for glut sets x 10.  Rolled to L and side to sit with CGA.  SBT to w/c with min A.  Assisted to room and left with seat belt alarm and call bell in reach.   Therapy Documentation Precautions:  Precautions Precautions: Fall Precaution Comments: wound VAC surgical incision. hx of Right AKA & L AKA. 2L oxygen Restrictions Weight Bearing Restrictions: Yes RLE Weight Bearing: Non weight bearing LLE Weight Bearing: Non weight bearing  Pain: Pain Assessment Pain Score: 0-No pain    Therapy/Group: Individual Therapy  Reginia Naas  Magda Kiel, PT 09/24/2018, 12:47 PM

## 2018-09-24 NOTE — Progress Notes (Signed)
[place] PHYSICAL MEDICINE & REHABILITATION PROGRESS NOTE   Subjective/Complaints: Patient seen laying in bed this morning.  He states he slept well overnight.  He is refusing dressings to the ulcer on his right hand, discussed with nursing.  He was seen by nephrology yesterday, notes reviewed.  ROS: Denies CP, shortness of breath, nausea, vomiting, diarrhea.  Objective:   No results found. Recent Labs    09/22/18 1537  WBC 8.0  HGB 7.1*  HCT 23.1*  PLT 224   Recent Labs    09/22/18 1536  NA 131*  K 4.5  CL 93*  CO2 24  GLUCOSE 152*  BUN 61*  CREATININE 10.09*  CALCIUM 8.8*    Intake/Output Summary (Last 24 hours) at 09/24/2018 1051 Last data filed at 09/24/2018 1007 Gross per 24 hour  Intake 360 ml  Output -  Net 360 ml     Physical Exam: Vital Signs Blood pressure (!) 155/86, pulse 84, temperature 98.7 F (37.1 C), temperature source Oral, resp. rate 16, height 6' (1.829 m), weight 89.7 kg, SpO2 95 %. Constitutional: No distress . Vital signs reviewed. HENT: Normocephalic.  Atraumatic. Eyes: EOMI.  No discharge. Cardiovascular: No JVD. Respiratory: Normal effort.  GI: Non-distended. Musc: Right AKA with edema and tenderness, improving Left AKA with edema tenderness, improving Neurological: He is alert Motor: Bilateral UE 5/5 proximal to distal.  RLE 4+/5 hip flexion, unchanged LLE: 4-/5 hip flexion Skin: Left AKA with dressing C/D/I Right AKA with dressing C/D/I Psychiatric: Flat  Assessment/Plan: 1. Functional deficits secondary to left AKA which require 3+ hours per day of interdisciplinary therapy in a comprehensive inpatient rehab setting.  Physiatrist is providing close team supervision and 24 hour management of active medical problems listed below.  Physiatrist and rehab team continue to assess barriers to discharge/monitor patient progress toward functional and medical goals  Care Tool:  Bathing    Body parts bathed by patient: Right  arm, Left arm, Chest, Abdomen, Face, Front perineal area, Right upper leg, Left upper leg   Body parts bathed by helper: Buttocks Body parts n/a: Right lower leg, Left lower leg   Bathing assist Assist Level: Moderate Assistance - Patient 50 - 74%     Upper Body Dressing/Undressing Upper body dressing   What is the patient wearing?: Pull over shirt    Upper body assist Assist Level: Set up assist    Lower Body Dressing/Undressing Lower body dressing      What is the patient wearing?: Pants, Underwear/pull up     Lower body assist Assist for lower body dressing: Maximal Assistance - Patient 25 - 49%     Toileting Toileting    Toileting assist Assist for toileting: Moderate Assistance - Patient 50 - 74%     Transfers Chair/bed transfer  Transfers assist  Chair/bed transfer activity did not occur: Safety/medical concerns  Chair/bed transfer assist level: Moderate Assistance - Patient 50 - 74% Chair/bed transfer assistive device: Sliding board   Locomotion Ambulation   Ambulation assist   Ambulation activity did not occur: N/A          Walk 10 feet activity   Assist  Walk 10 feet activity did not occur: N/A        Walk 50 feet activity   Assist           Walk 150 feet activity   Assist Walk 150 feet activity did not occur: N/A         Walk 10 feet on uneven  surface  activity   Assist Walk 10 feet on uneven surfaces activity did not occur: N/A         Wheelchair     Assist Will patient use wheelchair at discharge?: Yes Type of Wheelchair: Manual    Wheelchair assist level: Supervision/Verbal cueing, Set up assist Max wheelchair distance: 100'    Wheelchair 50 feet with 2 turns activity    Assist        Assist Level: Supervision/Verbal cueing, Set up assist   Wheelchair 150 feet activity     Assist     Assist Level: Supervision/Verbal cueing    Medical Problem List and Plan: 1.Functional  deficitssecondary to PAD, new left AKA (09/08/2018), recent right AKA (08/04/2018) Continue CIR 2. Antithrombotics: -DVT/anticoagulation:Pharmaceutical:Heparin -antiplatelet therapy: N/A 3. Pain Management:Oxycodone prn  Added gabapentin for phantom pain LLE, 100mg  qhs  Controlled on 5/5 4. Mood:LCSW to follow for evaluation and support. -antipsychotic agents: N/A 5. Neuropsych: This patientis not fullycapable of making decisions on hisown behalf. 6. Skin/Wound Care:  VAC DC'd on 6/30  Patient refusing dressing changes to right fifth digit ulcer 7. Fluids/Electrolytes/Nutrition:Strict I/O. Daily weights. Labs with HD. 8. OSA/Chronic respiratory failure: Oxygen dependent--2 L during the day and 3 liters at nights.  BiPAP nightly  Continue supplemental oxygen 9. HTN: Monitor BP TID--On Norvasc and metoprolol daily  Labile on 7/5 10. T2DM with gastroparesis and neuropathy: Brittle diabetic. Continue SSI as at home.   Reglan tid ac for gastroparesis.   CBGs changed back to AC/at bedtime  Slightly elevated on 7/5 11. Glaucoma with poor vision: On Timoptic at bedtime and Alphagan bid 12. Acute on chronic anemia: On Aranesp weekly.  Hemoglobin 7.1 on 7/3  Continue to monitor 13.  ESRD: HD MWF.  -HD after therapy day to allow participation in rehab activities  -nephrology following, appreciate Recs    LOS: 8 days A FACE TO FACE EVALUATION WAS PERFORMED  Jak Haggar Lorie Phenix 09/24/2018, 10:51 AM

## 2018-09-24 NOTE — Progress Notes (Signed)
[facility] Progress Note   Subjective:   Patient seen and examined in room.  Sitting up in bed.  No new complaints.  Denies SOB, CP, weakness, dizziness, n/v/d.    Objective Vitals:   09/23/18 2105 09/24/18 0433 09/24/18 0436 09/24/18 0810  BP: 132/75 (!) 148/75  (!) 155/86  Pulse: 79 84  84  Resp: 18 16    Temp: 97.9 F (36.6 C) 98.7 F (37.1 C)    TempSrc: Oral Oral    SpO2: 93% 95%    Weight:   89.7 kg   Height:       Physical Exam General:NAD, chronically ill appearing male, sitting up in bed  Heart:RRR, no mrg Lungs:CTAB Abdomen:soft, NTND Extremities:b/l AKA, in ace bandages, no edema Dialysis Access: LU AVG +b/t   Filed Weights   09/22/18 1818 09/23/18 0500 09/24/18 0436  Weight: 88.6 kg 88.8 kg 89.7 kg    Intake/Output Summary (Last 24 hours) at 09/24/2018 0917 Last data filed at 09/23/2018 1900 Gross per 24 hour  Intake 240 ml  Output -  Net 240 ml    Additional Objective Labs: Basic Metabolic Panel: Recent Labs  Lab 09/18/18 2129 09/20/18 1452 09/22/18 1536  NA 130* 132* 131*  K 4.6 4.5 4.5  CL 92* 95* 93*  CO2 24 24 24   GLUCOSE 111* 101* 152*  BUN 70* 60* 61*  CREATININE 13.05* 10.60* 10.09*  CALCIUM 9.2 9.1 8.8*  PHOS 6.2* 4.9* 4.8*   Liver Function Tests: Recent Labs  Lab 09/18/18 2129 09/20/18 1452 09/22/18 1536  ALBUMIN 2.2* 2.4* 2.4*   CBC: Recent Labs  Lab 09/18/18 2129 09/20/18 1452 09/22/18 1537  WBC 7.0 6.2 8.0  HGB 7.1* 7.2* 7.1*  HCT 23.7* 24.0* 23.1*  MCV 96.0 96.8 97.5  PLT 235 246 224   CBG: Recent Labs  Lab 09/23/18 0641 09/23/18 1156 09/23/18 1658 09/23/18 2106 09/24/18 0618  GLUCAP 105* 148* 130* 123* 125*   Studies/Results: No results found.  Medications: . ferric gluconate (FERRLECIT/NULECIT) IV 62.5 mg (09/20/18 1628)   . sodium chloride   Intravenous Once  . amLODipine  10 mg Oral Daily  . brimonidine  1 drop Both Eyes BID  . chlorhexidine  15 mL Mouth Rinse BID  .  Chlorhexidine Gluconate Cloth  6 each Topical Q0600  . darbepoetin (ARANESP) injection - DIALYSIS  200 mcg Intravenous Q Fri-HD  . docusate sodium  100 mg Oral BID  . feeding supplement (NEPRO CARB STEADY)  237 mL Oral Q24H  . feeding supplement (PRO-STAT SUGAR FREE 64)  30 mL Oral BID  . ferric citrate  210 mg Oral TID WC  . fluticasone  1 spray Each Nare Daily  . gabapentin  100 mg Oral QHS  . heparin  5,000 Units Subcutaneous Q8H  . insulin aspart  0-5 Units Subcutaneous QHS  . insulin aspart  0-9 Units Subcutaneous TID WC  . metoCLOPramide  5 mg Oral TID AC  . metoprolol succinate  50 mg Oral Daily  . multivitamin  1 tablet Oral QHS  . mupirocin ointment   Nasal BID  . pantoprazole  40 mg Oral Daily  . timolol  1 drop Both Eyes BID    Dialysis Orders: [facility] MWF  4h 48min 450/800 93kg 2/2.25 bath Hep 9000 L AVG - parsabiv 10, iPTH 640 6/17  - Hb 8 at dc 6/10 on home O2 - Mircera 200 given 6/16 - tsat after last hosp d/c 37% - had been offParsabiv during  recent hospitalization  Assessment/Plan: 1.Debility - on CIR 2. 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 L AKA on 09/08/2018 for multiple wounds not responding to Rx. 3. ESRD -on HD MWF. HD tomorrow per regular schedule. lastK 4.5.  4. Anemia of CKD-Hgb stable at 7.1. Blood not given with last HD d/t Hgb>7.  CBC ordered this AM, transfuse as indicated. Aranesp 215mcg qwk given 7/3. Getting Fe load. 5. Secondary hyperparathyroidism -CCa 10.4, phos 4.8. Stopped Ca Acetate d/t Jamas Lav, started Auryxia 1PO TID. No parsabiv on formulary in hospital.  6. HTN/volume -Will need lower edw on d/c. Last post HD weight 88.6kg Continue to titrate down volume as tolerated. BP in goal.  7. Nutrition -Renal diet with fluid restrictions, vit, protein supplements.  8. DM - per primary  Jen Mow, PA-C [facility] Pager:  [phone] 09/24/2018,9:17 AM  LOS: 8 days

## 2018-09-24 NOTE — Progress Notes (Signed)
Patient consistently refusing dressing changes per order. Discussed with MD. MD and RN provided education to patient about need for wound care. Patient continues to refuse. Order discontinued.

## 2018-09-25 ENCOUNTER — Inpatient Hospital Stay (HOSPITAL_COMMUNITY): Payer: Medicare Other

## 2018-09-25 LAB — CBC
HCT: 24.3 % — ABNORMAL LOW (ref 39.0–52.0)
Hemoglobin: 7.3 g/dL — ABNORMAL LOW (ref 13.0–17.0)
MCH: 29.7 pg (ref 26.0–34.0)
MCHC: 30 g/dL (ref 30.0–36.0)
MCV: 98.8 fL (ref 80.0–100.0)
Platelets: 233 10*3/uL (ref 150–400)
RBC: 2.46 MIL/uL — ABNORMAL LOW (ref 4.22–5.81)
RDW: 17.6 % — ABNORMAL HIGH (ref 11.5–15.5)
WBC: 7 10*3/uL (ref 4.0–10.5)
nRBC: 0 % (ref 0.0–0.2)

## 2018-09-25 LAB — RENAL FUNCTION PANEL
Albumin: 2.4 g/dL — ABNORMAL LOW (ref 3.5–5.0)
Anion gap: 14 (ref 5–15)
BUN: 64 mg/dL — ABNORMAL HIGH (ref 6–20)
CO2: 24 mmol/L (ref 22–32)
Calcium: 9.1 mg/dL (ref 8.9–10.3)
Chloride: 93 mmol/L — ABNORMAL LOW (ref 98–111)
Creatinine, Ser: 11.56 mg/dL — ABNORMAL HIGH (ref 0.61–1.24)
GFR calc Af Amer: 5 mL/min — ABNORMAL LOW (ref >60)
GFR calc non Af Amer: 4 mL/min — ABNORMAL LOW (ref >60)
Glucose, Bld: 115 mg/dL — ABNORMAL HIGH (ref 70–99)
Phosphorus: 4.7 mg/dL — ABNORMAL HIGH (ref 2.5–4.6)
Potassium: 4.5 mmol/L (ref 3.5–5.1)
Sodium: 131 mmol/L — ABNORMAL LOW (ref 135–145)

## 2018-09-25 LAB — GLUCOSE, CAPILLARY
Glucose-Capillary: 180 mg/dL — ABNORMAL HIGH (ref 70–99)
Glucose-Capillary: 106 mg/dL — ABNORMAL HIGH (ref 70–99)
Glucose-Capillary: 128 mg/dL — ABNORMAL HIGH (ref 70–99)
Glucose-Capillary: 159 mg/dL — ABNORMAL HIGH (ref 70–99)

## 2018-09-25 LAB — PREPARE RBC (CROSSMATCH)

## 2018-09-25 MED ORDER — OXYCODONE HCL 5 MG PO TABS
ORAL_TABLET | ORAL | Status: AC
  Filled 2018-09-25: qty 2

## 2018-09-25 MED ORDER — PENTAFLUOROPROP-TETRAFLUOROETH EX AERO: 1.0000 "application " | INHALATION_SPRAY | CUTANEOUS | Status: DC | PRN

## 2018-09-25 MED ORDER — LIDOCAINE-PRILOCAINE 2.5-2.5 % EX CREA
1.0000 "application " | TOPICAL_CREAM | CUTANEOUS | Status: DC | PRN
  Filled 2018-09-25: qty 5

## 2018-09-25 MED ORDER — ALTEPLASE 2 MG IJ SOLR
2.0000 mg | Freq: Once | INTRAMUSCULAR | Status: DC | PRN
  Filled 2018-09-25: qty 2

## 2018-09-25 MED ORDER — HEPARIN SODIUM (PORCINE) 1000 UNIT/ML DIALYSIS
100.0000 [IU]/kg | INTRAMUSCULAR | Status: DC | PRN
  Filled 2018-09-25: qty 9

## 2018-09-25 MED ORDER — SODIUM CHLORIDE 0.9 % IV SOLN: 100.0000 mL | INTRAVENOUS | Status: DC | PRN

## 2018-09-25 MED ORDER — HEPARIN SODIUM (PORCINE) 1000 UNIT/ML DIALYSIS
1000.0000 [IU] | INTRAMUSCULAR | Status: DC | PRN
  Filled 2018-09-25: qty 1

## 2018-09-25 MED ORDER — SODIUM CHLORIDE 0.9% IV SOLUTION: Freq: Once | INTRAVENOUS | Status: DC

## 2018-09-25 MED ORDER — LIDOCAINE HCL (PF) 1 % IJ SOLN: 5.0000 mL | INTRAMUSCULAR | Status: DC | PRN

## 2018-09-25 NOTE — Progress Notes (Signed)
Physical Therapy Session Note  Patient Details  Name: Howard Bell MRN: [id number] Date of Birth: 06-Oct-1965  Today's Date: 09/25/2018 PT Individual Time:  -      Short Term Goals: Week 2:  PT Short Term Goal 1 (Week 2): Patient will perform bed mobility with min A without hospital bed functions. PT Short Term Goal 2 (Week 2): Patient will perform basic transfers with min A consistently. PT Short Term Goal 3 (Week 2): Patient will propel w/c 150 feet with supervison. PT Short Term Goal 4 (Week 2): Patient wil perform car transfers with min A.     Skilled Therapeutic Interventions/Progress Updates:  Pt asleep in bed.  He awakned to touch and name.  Pt had pulled off [place]; he stated "I don't lke to wear it."  PT provided education about need for it.  O2 sats 90% on 2L after it was put back on, rising to 94% quickly. Pt disoriented to day of the week throughout session, despite orientation by PT.    Pt very somnolent, dozing during talking.  RLE ACE wrap coming off . Pt initially refused to have it re-wrapped.  Pt eventually agreed.  PT re-wrapped RLE, with pt continually falling alseep while holding R residual limb up to assist with re-wrapping.  Retention sock over ACE on L residual limb.  PT pulled up retention sock, but it is too long for his residual limb.  PT will contact vendor for modification.    Rolling L><R using bed features, supervision. for rolling so that PT could pull up shorts.  Supervision for coming up to sitting, using HOB raised.   In sitting, dynamic balance activities, leaning out of BOS x 5 x 2 L/R and 10 x 1 A-P leans.  Slide board transfer with CGA.  With max cues for technique of leaning, pt placed board.  Further cues for safe placement.  Transfer with steadying assist for board, max assist for technique.  Pt has poor retention of head/hips relationship and rejects cues for that technique.  Extra time and hand over hand assistance for finding tube that armrest fits  into on w/c frame, max cues to scoot away from it to allow room for armrest.   w/c propulsion on level tile x 50' including turns, slowly, with supervision.  UBE at level 5 x 3 min, x 2 min , without back support, from w/c level.  Seated Therapeutic exercise performed with LE to increase strength for functional mobility for 15 x 1 bil adductor squeezes, L hip extension.  At end of session, pt resting in w/c with seat belt alarm set and needs at hand.       Therapy Documentation Precautions:  Precautions Precautions: Fall Precaution Comments: wound VAC surgical incision. hx of Right AKA & L AKA. 2L oxygen Restrictions Weight Bearing Restrictions: Yes RLE Weight Bearing: Non weight bearing LLE Weight Bearing: Non weight bearing General:   Vital Signs:  Pain: Pain Assessment Pain Scale: 0-10 Pain Score: 0-No pain Faces Pain Scale: No hurt Mobility:   Locomotion :    Trunk/Postural Assessment :    Balance:   Exercises:   Other Treatments:      Therapy/Group: Individual Therapy  Francisca Langenderfer 09/25/2018, 10:05 AM

## 2018-09-25 NOTE — Progress Notes (Signed)
Occupational Therapy Session Note  Patient Details  Name: Howard Bell MRN: [id number] Date of Birth: December 06, 1965  Today's Date: 09/25/2018 OT Individual Time: 1100-1155 OT Individual Time Calculation (min): 55 min    Short Term Goals: Week 2:  OT Short Term Goal 1 (Week 2): STG=LTG secondary to ELOS  Skilled Therapeutic Interventions/Progress Updates:    Pt asleep in w/c upon arrival but easily aroused.  OT intervention with focus on w/c mobility and BUE therex.  Pt continues to have difficulty keeping his eyes open and requires min verbal cues to open eyes after each rest break. O2 sats>90% throughout session. Pt completed chest presses and overhead presses (3X10) with 3# bar-rest breaks between each set. Pt supervision with w/c mobility-min verbal cues for safety awareness. Pt remained in w/c with all needs within reach and belt alarm activated.   Therapy Documentation Precautions:  Precautions Precautions: Fall Precaution Comments: wound VAC surgical incision. hx of Right AKA & L AKA. 2L oxygen Restrictions Weight Bearing Restrictions: Yes RLE Weight Bearing: Non weight bearing LLE Weight Bearing: Non weight bearing  Pain: Pain Assessment Pain Scale: 0-10 Pain Score: 0-No pain Faces Pain Scale: No hurt   Therapy/Group: Individual Therapy  Leroy Libman 09/25/2018, 12:00 PM

## 2018-09-25 NOTE — Progress Notes (Addendum)
[facility] Progress Note   Subjective: No C/Os. Says he is going home soon.   Objective Vitals:   09/24/18 2140 09/25/18 0422 09/25/18 0428 09/25/18 1300  BP:   (!) 151/72 (!) 143/72  Pulse:   85 76  Resp:   14 16  Temp:   98.2 F (36.8 C) 97.8 F (36.6 C)  TempSrc:   Oral Oral  SpO2: 93%  92% 93%  Weight:  90.8 kg  91.9 kg  Height:       Physical Exam General:Chronically ill appearing male in NAD Heart:S1,S2 RRR Lungs:CTAB A/P Abdomen:active BS Extremities:L AKA withace wrap.R AKA suture line approximated without drainage. No stump edema Dialysis Access:L AVG + bruit   Additional Objective Labs: Basic Metabolic Panel: Recent Labs  Lab 09/18/18 2129 09/20/18 1452 09/22/18 1536  NA 130* 132* 131*  K 4.6 4.5 4.5  CL 92* 95* 93*  CO2 24 24 24   GLUCOSE 111* 101* 152*  BUN 70* 60* 61*  CREATININE 13.05* 10.60* 10.09*  CALCIUM 9.2 9.1 8.8*  PHOS 6.2* 4.9* 4.8*   Liver Function Tests: Recent Labs  Lab 09/18/18 2129 09/20/18 1452 09/22/18 1536  ALBUMIN 2.2* 2.4* 2.4*   No results for input(s): LIPASE, AMYLASE in the last 168 hours. CBC: Recent Labs  Lab 09/18/18 2129 09/20/18 1452 09/22/18 1537 09/24/18 1102  WBC 7.0 6.2 8.0 5.9  HGB 7.1* 7.2* 7.1* 7.2*  HCT 23.7* 24.0* 23.1* 23.3*  MCV 96.0 96.8 97.5 98.7  PLT 235 246 224 201   Blood Culture    Component Value Date/Time   SDES BLOOD LEFT ANTECUBITAL 08/15/2018 1526   SPECREQUEST  08/15/2018 1526    BOTTLES DRAWN AEROBIC ONLY Blood Culture adequate volume   CULT  08/15/2018 1526    NO GROWTH 5 DAYS Performed at [facility], [place] [address] [place], [place] [address]    REPTSTATUS 08/20/2018 FINAL 08/15/2018 1526    Cardiac Enzymes: No results for input(s): CKTOTAL, CKMB, CKMBINDEX, TROPONINI in the last 168 hours. CBG: Recent Labs  Lab 09/24/18 1102 09/24/18 1603 09/24/18 2102 09/25/18 0619 09/25/18 1127  GLUCAP 137* 140* 149* 180* 106*   Iron  Studies: No results for input(s): IRON, TIBC, TRANSFERRIN, FERRITIN in the last 72 hours. @lablastinr3 @ Studies/Results: No results found. Medications: . ferric gluconate (FERRLECIT/NULECIT) IV 62.5 mg (09/20/18 1628)   . sodium chloride   Intravenous Once  . amLODipine  10 mg Oral Daily  . brimonidine  1 drop Both Eyes BID  . chlorhexidine  15 mL Mouth Rinse BID  . Chlorhexidine Gluconate Cloth  6 each Topical Q0600  . darbepoetin (ARANESP) injection - DIALYSIS  200 mcg Intravenous Q Fri-HD  . docusate sodium  100 mg Oral BID  . feeding supplement (NEPRO CARB STEADY)  237 mL Oral Q24H  . feeding supplement (PRO-STAT SUGAR FREE 64)  30 mL Oral BID  . ferric citrate  210 mg Oral TID WC  . fluticasone  1 spray Each Nare Daily  . gabapentin  100 mg Oral QHS  . heparin  5,000 Units Subcutaneous Q8H  . insulin aspart  0-5 Units Subcutaneous QHS  . insulin aspart  0-9 Units Subcutaneous TID WC  . metoCLOPramide  5 mg Oral TID AC  . metoprolol succinate  50 mg Oral Daily  . multivitamin  1 tablet Oral QHS  . mupirocin ointment   Nasal BID  . pantoprazole  40 mg Oral Daily  . timolol  1 drop Both Eyes BID  Dialysis Orders: AF MWF  4h 23min 450/800 93kg 2/2.25 bath Hep 9000 L AVG - parsabiv 10, iPTH 640 6/17  - Hb 8 at dc 6/10 on home O2 - Mircera 200 given 6/16 - tsat after last hosp d/c 37% - had been offParsabiv during recent hospitalization  Assessment/Plan: 1.Debility - on CIR 2. 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 L AKA on 09/08/2018 for multiple wounds not responding to Rx. 3. ESRD -on HD MWF. HDtoday on schedule lastK 4.5. 4. Anemia of CKD-Hgbstable at7.1.Will transfuse today-HGB  7.1-7.2 for past 4 days! Aranesp 223mcg qwk given 7/3. Getting Fe load. 5. Secondary hyperparathyroidism -CCa 10.4, phos 4.8. Stopped Ca Acetate d/t Jamas Lav, started Auryxia 1PO TID. No parsabiv on  formularyin hospital.  6. HTN/volume -Will need lower edw on d/c.Last post HD weight 88.6kgContinue to titrate down volume as tolerated. BP in goal.  7. Nutrition -Renal diet with fluid restrictions, vit, protein supplements.  8. DM - per primary   Rita H. Brown NP-C 09/25/2018, 1:46 PM  [facility] [phone]  Pt seen, examined and agree w A/P as above.  Kelly Splinter  MD 09/25/2018, 3:54 PM

## 2018-09-25 NOTE — Progress Notes (Signed)
[facility] PHYSICAL MEDICINE & REHABILITATION PROGRESS NOTE   Subjective/Complaints: Patient seen laying in bed this morning.  He states he slept well overnight.  He states he wore the BiPAP overnight.  ROS: Denies CP, shortness of breath, nausea, vomiting, diarrhea.  Objective:   No results found. Recent Labs    09/22/18 1537 09/24/18 1102  WBC 8.0 5.9  HGB 7.1* 7.2*  HCT 23.1* 23.3*  PLT 224 201   Recent Labs    09/22/18 1536  NA 131*  K 4.5  CL 93*  CO2 24  GLUCOSE 152*  BUN 61*  CREATININE 10.09*  CALCIUM 8.8*    Intake/Output Summary (Last 24 hours) at 09/25/2018 0905 Last data filed at 09/24/2018 1822 Gross per 24 hour  Intake 360 ml  Output -  Net 360 ml     Physical Exam: Vital Signs Blood pressure (!) 151/72, pulse 85, temperature 98.2 F (36.8 C), temperature source Oral, resp. rate 14, height 6' (1.829 m), weight 90.8 kg, SpO2 92 %. Constitutional: No distress . Vital signs reviewed. HENT: Normocephalic.  Atraumatic. Eyes: EOMI.  No discharge. Cardiovascular: No JVD. Respiratory: Normal effort.  GI: Non-distended. Musc: Right AKA with edema and tenderness, stable Left AKA with edema tenderness, stable Neurological: He is alert Motor: Bilateral UE 5/5 proximal to distal.  RLE 4+/5 hip flexion, stable LLE: 4--4/5 hip flexion Skin: Left AKA with dressing C/D/I Right AKA with dressing C/D/I Psychiatric: Flat  Assessment/Plan: 1. Functional deficits secondary to left AKA which require 3+ hours per day of interdisciplinary therapy in a comprehensive inpatient rehab setting.  Physiatrist is providing close team supervision and 24 hour management of active medical problems listed below.  Physiatrist and rehab team continue to assess barriers to discharge/monitor patient progress toward functional and medical goals  Care Tool:  Bathing    Body parts bathed by patient: Right arm, Left arm, Chest, Abdomen, Face, Front perineal area, Right upper  leg, Left upper leg   Body parts bathed by helper: Buttocks Body parts n/a: Right lower leg, Left lower leg   Bathing assist Assist Level: Moderate Assistance - Patient 50 - 74%     Upper Body Dressing/Undressing Upper body dressing   What is the patient wearing?: Pull over shirt    Upper body assist Assist Level: Set up assist    Lower Body Dressing/Undressing Lower body dressing      What is the patient wearing?: Pants, Underwear/pull up     Lower body assist Assist for lower body dressing: Maximal Assistance - Patient 25 - 49%     Toileting Toileting    Toileting assist Assist for toileting: Moderate Assistance - Patient 50 - 74%     Transfers Chair/bed transfer  Transfers assist  Chair/bed transfer activity did not occur: Safety/medical concerns  Chair/bed transfer assist level: Minimal Assistance - Patient > 75% Chair/bed transfer assistive device: Sliding board   Locomotion Ambulation   Ambulation assist   Ambulation activity did not occur: N/A          Walk 10 feet activity   Assist  Walk 10 feet activity did not occur: N/A        Walk 50 feet activity   Assist           Walk 150 feet activity   Assist Walk 150 feet activity did not occur: N/A         Walk 10 feet on uneven surface  activity   Assist Walk 10 feet on uneven  surfaces activity did not occur: N/A         Wheelchair     Assist Will patient use wheelchair at discharge?: Yes Type of Wheelchair: Manual    Wheelchair assist level: Supervision/Verbal cueing Max wheelchair distance: 100'    Wheelchair 50 feet with 2 turns activity    Assist        Assist Level: Supervision/Verbal cueing   Wheelchair 150 feet activity     Assist     Assist Level: Supervision/Verbal cueing    Medical Problem List and Plan: 1.Functional deficitssecondary to PAD, new left AKA (09/08/2018), recent right AKA (08/04/2018) Continue CIR 2.  Antithrombotics: -DVT/anticoagulation:Pharmaceutical:Heparin -antiplatelet therapy: N/A 3. Pain Management:Oxycodone prn  Added gabapentin for phantom pain LLE, 100mg  qhs  Controlled on 7/6 4. Mood:LCSW to follow for evaluation and support. -antipsychotic agents: N/A 5. Neuropsych: This patientis not fullycapable of making decisions on hisown behalf. 6. Skin/Wound Care:  VAC DC'd on 6/30  Patient refusing dressing changes to right fifth digit ulcer 7. Fluids/Electrolytes/Nutrition:Strict I/O. Daily weights. Labs with HD. 8. OSA/Chronic respiratory failure: Oxygen dependent--2 L during the day and 3 liters at nights.  BiPAP nightly  Continue supplemental oxygen 9. HTN: Monitor BP TID--On Norvasc and metoprolol daily  Labile on 7/6 10. T2DM with gastroparesis and neuropathy: Brittle diabetic. Continue SSI as at home.   Reglan tid ac for gastroparesis.   CBGs changed back to AC/at bedtime  Trending up on 7/6 11. Glaucoma with poor vision: On Timoptic at bedtime and Alphagan bid 12. Acute on chronic anemia: On Aranesp weekly.  Hemoglobin 7.2 on 7/5  Continue to monitor 13.  ESRD: HD MWF.  -HD after therapy day to allow participation in rehab activities  -nephrology following, appreciate Recs    LOS: 9 days A FACE TO [place] 09/25/2018, 9:05 AM

## 2018-09-25 NOTE — Progress Notes (Signed)
Pt placed on CPAP for the night with a 2lt bleed in.

## 2018-09-25 NOTE — Progress Notes (Addendum)
1257: Patient transported via bed to HD accompanied by [employer].   1642: Patient returned to unit via bed accompanied by [employer].

## 2018-09-25 NOTE — Progress Notes (Signed)
Speech Language Pathology Daily Session Note  Patient Details  Name: Howard Bell MRN: [id number] Date of Birth: Jan 05, 1966  Today's Date: 09/25/2018 SLP Individual Time: 6067-7034 SLP Individual Time Calculation (min): 35 min  Short Term Goals: Week 1: SLP Short Term Goal 1 (Week 1): Pt will demonstrate sustained attention for 5 minute intervals during functional tasks with mod A verbal cues for redirection. SLP Short Term Goal 2 (Week 1): Pt will demonstrate functional problem solving for basic and familiar tasks with min A verbal cues. SLP Short Term Goal 3 (Week 1): Pt will self-monitor and self-correct functional errors with max A verbal cues. SLP Short Term Goal 4 (Week 1): Pt will utilize external memory aids to recall new, daily information with mod A verbal cues. SLP Short Term Goal 5 (Week 1): Pt will demonstrate orientation x4 with supervision A verbal cues.  Skilled Therapeutic Interventions:Skilled ST services focused on cognitive skills. SLP facilitated basic problem solving, sustained attention and recall with novel/simple PEG design tasks, pt required initial max A verbal cues for planning coordinance of PEGs, however given yes/no questions reduced to min A verbal cues. Pt required mod A verbal cues for problem solving once the pattern was initial started and mod A verbal cues for recall. Pt demonstrated sustained attention in 10 minute intervals with mod A verbal cues for redirection, however as session continued pt demonstrated reduced attention in 3-5 minute intervals and eventually session was ended early due to fatigue/closing eyes. Pt was left in room with call bell within reach and bed alarm set. ST recommends to continue skilled ST services.      Pain Pain Assessment Pain Score: 0-No pain  Therapy/Group: Individual Therapy  Zionah Criswell  [facility] 09/25/2018, 12:44 PM

## 2018-09-25 NOTE — Plan of Care (Signed)
  Problem: Consults Goal: RH LIMB LOSS PATIENT EDUCATION Description: Description: See Patient Education module for eduction specifics. Outcome: Progressing   Problem: RH SKIN INTEGRITY Goal: RH STG MAINTAIN SKIN INTEGRITY WITH ASSISTANCE Description: STG Maintain Skin Integrity With min Assistance. Outcome: Progressing   Problem: RH SAFETY Goal: RH STG ADHERE TO SAFETY PRECAUTIONS W/ASSISTANCE/DEVICE Description: STG Adhere to Safety Precautions With min Assistance/Device. Outcome: Progressing   Problem: RH PAIN MANAGEMENT Goal: RH STG PAIN MANAGED AT OR BELOW PT'S PAIN GOAL Description: Less than 3 out of 10 Outcome: Not Progressing   Problem: RH KNOWLEDGE DEFICIT LIMB LOSS Goal: RH STG INCREASE KNOWLEDGE OF SELF CARE AFTER LIMB LOSS Description: Min assist Outcome: Progressing

## 2018-09-26 ENCOUNTER — Inpatient Hospital Stay (HOSPITAL_COMMUNITY): Payer: Medicare Other

## 2018-09-26 LAB — TYPE AND SCREEN
ABO/RH(D): A POS
Antibody Screen: NEGATIVE
Unit division: 0

## 2018-09-26 LAB — BPAM RBC
Blood Product Expiration Date: 202007262359
ISSUE DATE / TIME: 202007061514
Unit Type and Rh: 6200

## 2018-09-26 LAB — GLUCOSE, CAPILLARY
Glucose-Capillary: 149 mg/dL — ABNORMAL HIGH (ref 70–99)
Glucose-Capillary: 146 mg/dL — ABNORMAL HIGH (ref 70–99)
Glucose-Capillary: 144 mg/dL — ABNORMAL HIGH (ref 70–99)
Glucose-Capillary: 147 mg/dL — ABNORMAL HIGH (ref 70–99)

## 2018-09-26 MED ORDER — LATANOPROST 0.005 % OP SOLN
1.0000 [drp] | Freq: Every day | OPHTHALMIC | Status: DC
  Administered 2018-09-26 – 2018-09-28 (×3): 1 [drp] via OPHTHALMIC
  Filled 2018-09-26: qty 2.5

## 2018-09-26 MED ORDER — ACETAMINOPHEN 325 MG PO TABS: 325.0000 mg | ORAL_TABLET | ORAL | Status: AC | PRN

## 2018-09-26 NOTE — Progress Notes (Signed)
Physical Therapy Session Note  Patient Details  Name: Howard Bell MRN: [id number] Date of Birth: 05-27-1965  Today's Date: 09/26/2018 PT Individual Time: 1330-1415 PT Individual Time Calculation (min): 45 min   Short Term Goals: Week 2:  PT Short Term Goal 1 (Week 2): Patient will perform bed mobility with min A without hospital bed functions. PT Short Term Goal 2 (Week 2): Patient will perform basic transfers with min A consistently. PT Short Term Goal 3 (Week 2): Patient will propel w/c 150 feet with supervison. PT Short Term Goal 4 (Week 2): Patient wil perform car transfers with min A.  Skilled Therapeutic Interventions/Progress Updates:     Patient in bed upon PT arrival. Patient alert and agreeable to PT session. B ACE wraps and retention sock donned prior to session, however falling off. PT re-wrapped 4" ACE wraps on residual limb, providing patient education about wrapping. He stated that his wife was wrapping his R residual limb PTA and that she is a Marine scientist. PT donned retention sock over L residual limb to maintain wrap during mobility. Patient on 2L O2 at beginning of session at 88% O2, PT increased to 3L O2 per MD orders throughout session.   Therapeutic Activity: Bed Mobility: Patient performed supine to/from sit with min A. Provided verbal cues for rolling to the L then setting his elbow to sit up. Transfers: Patient performed slide board transfers to/from the bed and a mat table form the w/c with CGA-close supervision, with mod A for board placment. Provided verbal cues for hand and board positioning and scooting technique.  Wheelchair Mobility:  Patient propelled wheelchair 150 feet and 75 feet  with supervision. Provided verbal cues for locking breaks throughout session. Required cues for sequencing when setting up w/c for transfers.   Neuromuscular Re-ed: Patient performed dynamic sitting balance for core strengthening and balance training sitting EOB hitting a green  exercise ball with a 3 lb weighted bar pushing from his chest, 2x5 min with a seated rest break in between.   Therapeutic Exercise: Patient performed the following exercises with verbal and tactile cues for proper technique. -B shoulder flexion with 3lb weighted bar 2x10 -B bicep curl with 3lb weighted bar 2x10  Patient in w/c at end of session with breaks locked, seat belt alarm set, and all needs within reach. On 3L O2 at end of session at 94%.   Therapy Documentation Precautions:  Precautions Precautions: Fall Precaution Comments: wound VAC surgical incision. hx of Right AKA & L AKA. 2L oxygen Restrictions Weight Bearing Restrictions: Yes RLE Weight Bearing: Non weight bearing LLE Weight Bearing: Non weight bearing Pain: Pain Assessment Pain Score: 0-No pain    Therapy/Group: Individual Therapy  Howard Bell L Howard Bell PT, DPT  09/26/2018, 4:30 PM

## 2018-09-26 NOTE — Plan of Care (Signed)
  Problem: Consults Goal: Skin Care Protocol Initiated - if Braden Score 18 or less Description: If consults are not indicated, leave blank or document N/A Outcome: Progressing   Problem: RH SKIN INTEGRITY Goal: RH STG MAINTAIN SKIN INTEGRITY WITH ASSISTANCE Description: STG Maintain Skin Integrity With min Assistance. Outcome: Progressing   Problem: RH SAFETY Goal: RH STG ADHERE TO SAFETY PRECAUTIONS W/ASSISTANCE/DEVICE Description: STG Adhere to Safety Precautions With min Assistance/Device. Outcome: Progressing   Problem: RH PAIN MANAGEMENT Goal: RH STG PAIN MANAGED AT OR BELOW PT'S PAIN GOAL Description: Less than 3 out of 10 Outcome: Progressing   Problem: RH KNOWLEDGE DEFICIT LIMB LOSS Goal: RH STG INCREASE KNOWLEDGE OF SELF CARE AFTER LIMB LOSS Description: Min assist Outcome: Progressing

## 2018-09-26 NOTE — Progress Notes (Signed)
Occupational Therapy Session Note  Patient Details  Name: Howard Bell MRN: [id number] Date of Birth: [birth year]  Today's Date: 09/26/2018 OT Individual Time: 6825-7493 OT Individual Time Calculation (min): 75 min    Short Term Goals: Week 2:  OT Short Term Goal 1 (Week 2): STG=LTG secondary to ELOS  Skilled Therapeutic Interventions/Progress Updates:    Pt resting in bed upon arrival and agreeable to engaging in bathing/dressing at bed level. Pt requires more than a reasonable amount of time with multiple breaks to complete tasks.  Pt frequently stops tasks and sits in bed with eyes closed before reinitiating tasks.  Pt required CGA for pulling pants over hips.  Pt with delayed responses to questions/commands.  Pt refused OOB activities or transferring to w/c.  Pt required verbal cues to replace nasal canula after bathing/dressing tasks. Pt remained in bed with all needs within reach and bed alarm activated.   Therapy Documentation Precautions:  Precautions Precautions: Fall Precaution Comments: wound VAC surgical incision. hx of Right AKA & L AKA. 2L oxygen Restrictions Weight Bearing Restrictions: Yes RLE Weight Bearing: Non weight bearing LLE Weight Bearing: Non weight bearing  Pain: Pain Assessment Pain Scale: 0-10 Pain Score: 0-No pain Faces Pain Scale: No hurt   Therapy/Group: Individual Therapy  Leroy Libman 09/26/2018, 11:32 AM

## 2018-09-26 NOTE — Progress Notes (Signed)
Speech Language Pathology Daily Session Note  Patient Details  Name: Howard Bell MRN: [id number] Date of Birth: [birth year]  Today's Date: 09/26/2018 SLP Individual Time: 1203-1230 SLP Individual Time Calculation (min): 27 min  Short Term Goals: Week 1: SLP Short Term Goal 1 (Week 1): Pt will demonstrate sustained attention for 5 minute intervals during functional tasks with mod A verbal cues for redirection. SLP Short Term Goal 2 (Week 1): Pt will demonstrate functional problem solving for basic and familiar tasks with min A verbal cues. SLP Short Term Goal 3 (Week 1): Pt will self-monitor and self-correct functional errors with max A verbal cues. SLP Short Term Goal 4 (Week 1): Pt will utilize external memory aids to recall new, daily information with mod A verbal cues. SLP Short Term Goal 5 (Week 1): Pt will demonstrate orientation x4 with supervision A verbal cues.  Skilled Therapeutic Interventions: Skilled ST services focused on cognitive skills. SLP facilitated basic problem solving, recall and sustained attention to task utilizing novel card task (Blink), pt required max A verbal cues and sustained attention in 5 minute intervals with mod A verbal cues. SLP assisted with lunch tray set up, pt navigated tray with min A verbal cues. Pt was left in room with call bell within reach and bed alarm set. ST recommends to continue skilled ST services.      Pain Pain Assessment Pain Scale: 0-10 Pain Score: 0-No pain Faces Pain Scale: No hurt  Therapy/Group: Individual Therapy  Allaya Abbasi  [facility] 09/26/2018, 12:32 PM

## 2018-09-26 NOTE — Progress Notes (Signed)
RN placed patient on CPAP machine.  RT will continue to monitor.

## 2018-09-26 NOTE — Progress Notes (Signed)
[place] PHYSICAL MEDICINE & REHABILITATION PROGRESS NOTE   Subjective/Complaints: Patient seen laying in bed this morning.  He states he slept well overnight.  Limited engagement.  He was seen by nephrology yesterday, notes reviewed.  ROS: Denies CP, shortness of breath, nausea, vomiting, diarrhea.  Objective:   No results found. Recent Labs    09/24/18 1102 09/25/18 1359  WBC 5.9 7.0  HGB 7.2* 7.3*  HCT 23.3* 24.3*  PLT 201 233   Recent Labs    09/25/18 1400  NA 131*  K 4.5  CL 93*  CO2 24  GLUCOSE 115*  BUN 64*  CREATININE 11.56*  CALCIUM 9.1    Intake/Output Summary (Last 24 hours) at 09/26/2018 7654 Last data filed at 09/25/2018 2050 Gross per 24 hour  Intake 666 ml  Output 3150 ml  Net -2484 ml     Physical Exam: Vital Signs Blood pressure (!) 153/84, pulse 83, temperature 99.5 F (37.5 C), temperature source Oral, resp. rate 16, height 6' (1.829 m), weight 87.5 kg, SpO2 95 %. Constitutional: No distress . Vital signs reviewed. HENT: Normocephalic.  Atraumatic. Eyes: EOMI. No discharge. Cardiovascular: No JVD. Respiratory: Normal effort.  GI: Non-distended. Musc: Right AKA with edema and tenderness, unchanged Left AKA with edema tenderness, unchanged Neurological: He is alert Motor: Bilateral UE 5/5 proximal to distal.  RLE 4+/5 hip flexion, stable LLE: 4--4/5 hip flexion, stable  Skin: Left AKA with dressing C/D/I Right AKA with dressing C/D/I Psychiatric: Flat  Assessment/Plan: 1. Functional deficits secondary to left AKA which require 3+ hours per day of interdisciplinary therapy in a comprehensive inpatient rehab setting.  Physiatrist is providing close team supervision and 24 hour management of active medical problems listed below.  Physiatrist and rehab team continue to assess barriers to discharge/monitor patient progress toward functional and medical goals  Care Tool:  Bathing    Body parts bathed by patient: Right arm, Left arm,  Chest, Abdomen, Face, Front perineal area, Right upper leg, Left upper leg   Body parts bathed by helper: Buttocks Body parts n/a: Right lower leg, Left lower leg   Bathing assist Assist Level: Moderate Assistance - Patient 50 - 74%     Upper Body Dressing/Undressing Upper body dressing   What is the patient wearing?: Pull over shirt    Upper body assist Assist Level: Set up assist    Lower Body Dressing/Undressing Lower body dressing      What is the patient wearing?: Pants, Underwear/pull up     Lower body assist Assist for lower body dressing: Maximal Assistance - Patient 25 - 49%     Toileting Toileting    Toileting assist Assist for toileting: Moderate Assistance - Patient 50 - 74%     Transfers Chair/bed transfer  Transfers assist  Chair/bed transfer activity did not occur: Safety/medical concerns  Chair/bed transfer assist level: Minimal Assistance - Patient > 75% Chair/bed transfer assistive device: Sliding board   Locomotion Ambulation   Ambulation assist   Ambulation activity did not occur: N/A          Walk 10 feet activity   Assist  Walk 10 feet activity did not occur: N/A        Walk 50 feet activity   Assist           Walk 150 feet activity   Assist Walk 150 feet activity did not occur: N/A         Walk 10 feet on uneven surface  activity  Assist Walk 10 feet on uneven surfaces activity did not occur: N/A         Wheelchair     Assist Will patient use wheelchair at discharge?: Yes Type of Wheelchair: Manual    Wheelchair assist level: Supervision/Verbal cueing Max wheelchair distance: 50    Wheelchair 50 feet with 2 turns activity    Assist        Assist Level: Supervision/Verbal cueing   Wheelchair 150 feet activity     Assist     Assist Level: Supervision/Verbal cueing    Medical Problem List and Plan: 1.Functional deficitssecondary to PAD, new left AKA (09/08/2018), recent  right AKA (08/04/2018) Continue CIR 2. Antithrombotics: -DVT/anticoagulation:Pharmaceutical:Heparin -antiplatelet therapy: N/A 3. Pain Management:Oxycodone prn  Added gabapentin for phantom pain LLE, 100mg  qhs  Controlled on 7/6 4. Mood:LCSW to follow for evaluation and support. -antipsychotic agents: N/A 5. Neuropsych: This patientis not fullycapable of making decisions on hisown behalf. 6. Skin/Wound Care:  VAC DC'd on 6/30  Patient refusing dressing changes to right fifth digit ulcer 7. Fluids/Electrolytes/Nutrition:Strict I/O. Daily weights. Labs with HD. 8. OSA/Chronic respiratory failure: Oxygen dependent--2 L during the day and 3 liters at nights.  BiPAP nightly  Continue supplemental oxygen 9. HTN: Monitor BP TID--On Norvasc and metoprolol daily  Labile on 7/6 10. T2DM with gastroparesis and neuropathy: Brittle diabetic. Continue SSI as at home.   Reglan tid ac for gastroparesis.   CBGs changed back to AC/at bedtime  Slightly labile on 7/6 11. Glaucoma with poor vision: On Timoptic at bedtime and Alphagan bid 12. Acute on chronic anemia: On Aranesp weekly.  Hemoglobin 7.3 on 7/6, transfused, no repeat labs yet  Continue to monitor 13.  ESRD: HD MWF.  -HD after therapy day to allow participation in rehab activities  -nephrology following, appreciate Recs    LOS: 10 days A FACE TO [place] 09/26/2018, 8:21 AM

## 2018-09-27 ENCOUNTER — Encounter (HOSPITAL_COMMUNITY): Payer: Medicare Other

## 2018-09-27 ENCOUNTER — Inpatient Hospital Stay (HOSPITAL_COMMUNITY): Payer: Medicare Other | Admitting: Occupational Therapy

## 2018-09-27 ENCOUNTER — Inpatient Hospital Stay (HOSPITAL_COMMUNITY): Payer: Medicare Other

## 2018-09-27 ENCOUNTER — Ambulatory Visit (HOSPITAL_COMMUNITY): Payer: Medicare Other

## 2018-09-27 LAB — GLUCOSE, CAPILLARY
Glucose-Capillary: 153 mg/dL — ABNORMAL HIGH (ref 70–99)
Glucose-Capillary: 114 mg/dL — ABNORMAL HIGH (ref 70–99)
Glucose-Capillary: 95 mg/dL (ref 70–99)
Glucose-Capillary: 168 mg/dL — ABNORMAL HIGH (ref 70–99)

## 2018-09-27 LAB — RENAL FUNCTION PANEL
Albumin: 2.5 g/dL — ABNORMAL LOW (ref 3.5–5.0)
Anion gap: 14 (ref 5–15)
BUN: 65 mg/dL — ABNORMAL HIGH (ref 6–20)
CO2: 24 mmol/L (ref 22–32)
Calcium: 9.3 mg/dL (ref 8.9–10.3)
Chloride: 92 mmol/L — ABNORMAL LOW (ref 98–111)
Creatinine, Ser: 10.52 mg/dL — ABNORMAL HIGH (ref 0.61–1.24)
GFR calc Af Amer: 6 mL/min — ABNORMAL LOW (ref >60)
GFR calc non Af Amer: 5 mL/min — ABNORMAL LOW (ref >60)
Glucose, Bld: 103 mg/dL — ABNORMAL HIGH (ref 70–99)
Phosphorus: 5.2 mg/dL — ABNORMAL HIGH (ref 2.5–4.6)
Potassium: 4.4 mmol/L (ref 3.5–5.1)
Sodium: 130 mmol/L — ABNORMAL LOW (ref 135–145)

## 2018-09-27 LAB — CBC
HCT: 26 % — ABNORMAL LOW (ref 39.0–52.0)
Hemoglobin: 8.4 g/dL — ABNORMAL LOW (ref 13.0–17.0)
MCH: 32.1 pg (ref 26.0–34.0)
MCHC: 32.3 g/dL (ref 30.0–36.0)
MCV: 99.2 fL (ref 80.0–100.0)
Platelets: 207 10*3/uL (ref 150–400)
RBC: 2.62 MIL/uL — ABNORMAL LOW (ref 4.22–5.81)
RDW: 17.3 % — ABNORMAL HIGH (ref 11.5–15.5)
WBC: 7.6 10*3/uL (ref 4.0–10.5)
nRBC: 0 % (ref 0.0–0.2)

## 2018-09-27 MED ORDER — SODIUM CHLORIDE 0.9 % IV SOLN: 100.0000 mL | INTRAVENOUS | Status: DC | PRN

## 2018-09-27 MED ORDER — HEPARIN SODIUM (PORCINE) 1000 UNIT/ML DIALYSIS: 5000.0000 [IU] | Freq: Once | INTRAMUSCULAR | Status: DC

## 2018-09-27 NOTE — Progress Notes (Signed)
Physical Therapy Session Note  Patient Details  Name: Howard Bell MRN: [id number] Date of Birth: [birth year]  Today's Date: 09/27/2018 PT Individual Time: 1100-1200 PT Individual Time Calculation (min): 60 min   Short Term Goals:  Week 2:  PT Short Term Goal 1 (Week 2): Patient will perform bed mobility with min A without hospital bed functions. PT Short Term Goal 2 (Week 2): Patient will perform basic transfers with min A consistently. PT Short Term Goal 3 (Week 2): Patient will propel w/c 150 feet with supervison. PT Short Term Goal 4 (Week 2): Patient wil perform car transfers with min A.     Skilled Therapeutic Interventions/Progress Updates:   Pt asleeo in bed.  No ACE on RLE, ACE and retention sock on LLE Wife, [name], present for family ed.  Pt awakened to wife calling his name.  tx focusing on ACE wrapping of residual limb, bed mobility, slide board transfers bed> w/c and w/c> simulated car.  Wife verified that pt has a slide board.  He has used [employer] for supplies for residual limbs.  She return -demonstrated safe wrapping of residual limb, use of retention sock; rolling with supervision and mod assist to sit up in flat bed without railings(pt states that he does not want a hospital bed).  Basic transfer with set up, close supervision and cues for technique.  Simulated car transfer with set up, close superviison and cues for technique.  At end of session, pt resting in w/c with seat belt alarm set and needs at hand.    [name] reported that Dr. Sharol Given had written a Rx for power w/c.  Pt and wife live in an apartment, so home accessibility/funding may be limited.  Due to time constraints, PT unable to explain all of this to [place].  PT directed her to speak with Lorre Nick, CSW for [facility], [facility]  contact information to pursue at d/c.      Therapy Documentation Precautions:  Precautions Precautions: Fall Precaution Comments: wound VAC surgical incision. hx of Right AKA & L  AKA. Restrictions Weight Bearing Restrictions: Yes RLE Weight Bearing: Non weight bearing LLE Weight Bearing: Non weight bearing    Vital Signs: on 3L O2 via [place]; O2 sats 94% with activity.  Pain: Pain Assessment Pain Scale: 0-10 Pain Score: 0-No pain      Therapy/Group: Individual Therapy  [name] 09/27/2018, 12:49 PM

## 2018-09-27 NOTE — Progress Notes (Signed)
Placed PT on bipap with 3l bled in 02. Pt tolerating well.

## 2018-09-27 NOTE — Progress Notes (Signed)
[place] PHYSICAL MEDICINE & REHABILITATION PROGRESS NOTE   Subjective/Complaints: Patient seen laying in bed this morning.  He states he slept well overnight.  He denies complaints.  ROS: Denies CP, shortness of breath, nausea, vomiting, diarrhea.  Objective:   No results found. Recent Labs    09/24/18 1102 09/25/18 1359  WBC 5.9 7.0  HGB 7.2* 7.3*  HCT 23.3* 24.3*  PLT 201 233   Recent Labs    09/25/18 1400  NA 131*  K 4.5  CL 93*  CO2 24  GLUCOSE 115*  BUN 64*  CREATININE 11.56*  CALCIUM 9.1    Intake/Output Summary (Last 24 hours) at 09/27/2018 0839 Last data filed at 09/27/2018 0745 Gross per 24 hour  Intake 702 ml  Output -  Net 702 ml     Physical Exam: Vital Signs Blood pressure (!) 167/66, pulse 82, temperature 99.4 F (37.4 C), temperature source Oral, resp. rate 15, height 6' (1.829 m), weight 87.5 kg, SpO2 99 %. Constitutional: No distress . Vital signs reviewed. HENT: Normocephalic.  Atraumatic.   Eyes: EOMI.  No discharge. Cardiovascular: No JVD. Respiratory: Normal effort.  GI: Non-distended. Musc: Right AKA with edema and tenderness, stable Left AKA with edema tenderness, stable Neurological: He is alert Motor: Bilateral UE 5/5 proximal to distal.  RLE 4+/5 hip flexion, unchanged LLE: 4--4/5 hip flexion, unchanged Skin: Left AKA with dressing C/D/I Right AKA with dressing C/D/I Psychiatric: Flat  Assessment/Plan: 1. Functional deficits secondary to left AKA which require 3+ hours per day of interdisciplinary therapy in a comprehensive inpatient rehab setting.  Physiatrist is providing close team supervision and 24 hour management of active medical problems listed below.  Physiatrist and rehab team continue to assess barriers to discharge/monitor patient progress toward functional and medical goals  Care Tool:  Bathing    Body parts bathed by patient: Right arm, Left arm, Chest, Abdomen, Face, Front perineal area, Right upper  leg, Left upper leg, Buttocks   Body parts bathed by helper: Buttocks Body parts n/a: Right lower leg, Left lower leg   Bathing assist Assist Level: Contact Guard/Touching assist     Upper Body Dressing/Undressing Upper body dressing   What is the patient wearing?: Pull over shirt    Upper body assist Assist Level: Independent    Lower Body Dressing/Undressing Lower body dressing      What is the patient wearing?: Pants, Underwear/pull up     Lower body assist Assist for lower body dressing: Contact Guard/Touching assist     Toileting Toileting    Toileting assist Assist for toileting: Moderate Assistance - Patient 50 - 74%     Transfers Chair/bed transfer  Transfers assist  Chair/bed transfer activity did not occur: Safety/medical concerns  Chair/bed transfer assist level: Contact Guard/Touching assist Chair/bed transfer assistive device: Sliding board   Locomotion Ambulation   Ambulation assist   Ambulation activity did not occur: N/A          Walk 10 feet activity   Assist  Walk 10 feet activity did not occur: N/A        Walk 50 feet activity   Assist           Walk 150 feet activity   Assist Walk 150 feet activity did not occur: N/A         Walk 10 feet on uneven surface  activity   Assist Walk 10 feet on uneven surfaces activity did not occur: N/A  Wheelchair     Assist Will patient use wheelchair at discharge?: Yes Type of Wheelchair: Manual    Wheelchair assist level: Supervision/Verbal cueing, Set up assist Max wheelchair distance: 150'    Wheelchair 50 feet with 2 turns activity    Assist        Assist Level: Supervision/Verbal cueing, Set up assist   Wheelchair 150 feet activity     Assist     Assist Level: Supervision/Verbal cueing, Set up assist    Medical Problem List and Plan: 1.Functional deficitssecondary to PAD, new left AKA (09/08/2018), recent right AKA  (08/04/2018) Continue CIR  Team conference today to discuss current and goals and coordination of care, home and environmental barriers, and discharge planning with nursing, case manager, and therapies.  2. Antithrombotics: -DVT/anticoagulation:Pharmaceutical:Heparin -antiplatelet therapy: N/A 3. Pain Management:Oxycodone prn  Added gabapentin for phantom pain LLE, 100mg  qhs  Controlled on 7/8 4. Mood:LCSW to follow for evaluation and support. -antipsychotic agents: N/A 5. Neuropsych: This patientis not fullycapable of making decisions on hisown behalf. 6. Skin/Wound Care:  VAC DC'd on 6/30  Patient refusing dressing changes to right fifth digit ulcer 7. Fluids/Electrolytes/Nutrition:Strict I/O. Daily weights. Labs with HD. 8. OSA/Chronic respiratory failure: Oxygen dependent--2 L during the day and 3 liters at nights.  BiPAP nightly  Continue supplemental oxygen 9. HTN: Monitor BP TID--On Norvasc and metoprolol daily  Labile on 7/8 10. T2DM with gastroparesis and neuropathy: Brittle diabetic. Continue SSI as at home.   Reglan tid ac for gastroparesis.   CBGs changed back to AC/at bedtime  Elevated on 7/7, due to being extremely brittle, will not make any changes at present 11. Glaucoma with poor vision: On Timoptic at bedtime and Alphagan bid 12. Acute on chronic anemia: On Aranesp weekly.  Hemoglobin 7.3 on 7/6, transfused, labs with HD  Continue to monitor 13.  ESRD: HD MWF.  -HD after therapy day to allow participation in rehab activities  -nephrology following, appreciate Recs    LOS: 11 days A FACE TO [place] 09/27/2018, 8:39 AM

## 2018-09-27 NOTE — Progress Notes (Signed)
Orthopedic Tech Progress Note Patient Details:  Howard Bell [birth year] [id number]  Patient ID: Howard Bell, male   DOB: 01-May-1965, 53 y.o.   MRN: [id number]   Howard Bell 09/27/2018, 2:54 PMCalled [employer] for Bilateral AKA stump socks.

## 2018-09-27 NOTE — Progress Notes (Signed)
Occupational Therapy Session Note  Patient Details  Name: Howard Bell MRN: [id number] Date of Birth: [birth year]  Today's Date: 09/27/2018 OT Individual Time: 0830-0900 OT Individual Time Calculation (min): 30 min    Short Term Goals: Week 1:  OT Short Term Goal 1 (Week 1): Pt will don shorts with mod A using lateral leans OT Short Term Goal 1 - Progress (Week 1): Met OT Short Term Goal 2 (Week 1): Pt will complete transfer to [facility] with mod A OT Short Term Goal 2 - Progress (Week 1): Met OT Short Term Goal 3 (Week 1): Pt will require no more than CGA for EOB sitting balance OT Short Term Goal 3 - Progress (Week 1): Met Week 2:  OT Short Term Goal 1 (Week 2): STG=LTG secondary to ELOS      Skilled Therapeutic Interventions/Progress Updates:    Upon entering room, pt asleep in bed with O2 tubing pulled down away from face. Pt woke easily, tubing adjusted and pt needed frequent cues in session to stay awake.  He kept closing eyes.  Readjusted ACE wrap on L limb and place limb sock on.    Pt bathed and dressed UB with s/u.  To don pants place bed in supine and pt worked on rolling left and right to pull pants over hips and legs with min A.  Pt moved very slowly this session. Discussed his family education sessions planned for today. Pt unaware that his wife was coming in.  Pt adjusted in bed and bed alarm set. All needs met.     Therapy Documentation Precautions:  Precautions Precautions: Fall Precaution Comments: wound VAC surgical incision. hx of Right AKA & L AKA. 2L oxygen Restrictions Weight Bearing Restrictions: Yes RLE Weight Bearing: Non weight bearing LLE Weight Bearing: Non weight bearing    Vital Signs: Therapy Vitals Temp: 99.4 F (37.4 C) Temp Source: Oral Pulse Rate: 82 Resp: 15 BP: (!) 167/66 Patient Position (if appropriate): Lying Oxygen Therapy SpO2: 99 % O2 Device: Nasal Cannula Pain: Pain Assessment Pain Scale: 0-10 Pain Score: 0-No pain Pain  Type: Surgical pain Pain Location: Leg Pain Orientation: Left Pain Descriptors / Indicators: Aching;Grimacing Pain Frequency: Intermittent Pain Onset: On-going Pain Intervention(s): Repositioned;Medication (See eMAR)   Therapy/Group: Individual Therapy  [place] 09/27/2018, 7:22 AM

## 2018-09-27 NOTE — Procedures (Signed)
   I was present at this dialysis session, have reviewed the session itself and made  appropriate changes Kelly Splinter MD [place] pager [phone]   09/27/2018, 2:58 PM

## 2018-09-27 NOTE — Progress Notes (Signed)
Occupational Therapy Session Note  Patient Details  Name: Howard Bell MRN: [id number] Date of Birth: [birth year]  Today's Date: 09/27/2018 OT Individual Time: 1015-1100 OT Individual Time Calculation (min): 45 min    Short Term Goals: Week 2:  OT Short Term Goal 1 (Week 2): STG=LTG secondary to ELOS  Skilled Therapeutic Interventions/Progress Updates:    Pt resting in bed upon arrival with wife present for education.  Discussed pt's current level of assistance with bathing/dressing (bed level). Pt's wife stated these were same arrangements as before.  Pt requested use of toilet and performed slide board transfer to drop arm BSC (CGA) with mod verbal cues for sequencing and safety awareness.  Pt required assistance pulling pants up but completed other toileting tasks without assitance with lateral leans. Pt required min A for transfer back to bed.  Pt requires min A for rolling in bed and mod A for supine>sit in bed.  Pt's wife pleased with progress and can provided appropriate level of assistance.  Wife stated she will be at home with pt. Pt remained in bed with bed alarm activated and all needs within reach.  Wife present.   Therapy Documentation Precautions:  Precautions Precautions: Fall Precaution Comments: wound VAC surgical incision. hx of Right AKA & L AKA. 2L oxygen Restrictions Weight Bearing Restrictions: Yes RLE Weight Bearing: Non weight bearing LLE Weight Bearing: Non weight bearing Pain: Pain Assessment Pain Score: 0-No pain   Therapy/Group: Individual Therapy  Leroy Libman 09/27/2018, 2:51 PM

## 2018-09-27 NOTE — Progress Notes (Signed)
Speech Language Pathology Daily Session Note  Patient Details  Name: Howard Bell MRN: [id number] Date of Birth: [birth year]  Today's Date: 09/27/2018 SLP Individual Time: 0903-1000 SLP Individual Time Calculation (min): 57 min  Short Term Goals: Week 1: SLP Short Term Goal 1 (Week 1): Pt will demonstrate sustained attention for 5 minute intervals during functional tasks with mod A verbal cues for redirection. SLP Short Term Goal 2 (Week 1): Pt will demonstrate functional problem solving for basic and familiar tasks with min A verbal cues. SLP Short Term Goal 3 (Week 1): Pt will self-monitor and self-correct functional errors with max A verbal cues. SLP Short Term Goal 4 (Week 1): Pt will utilize external memory aids to recall new, daily information with mod A verbal cues. SLP Short Term Goal 5 (Week 1): Pt will demonstrate orientation x4 with supervision A verbal cues.  Skilled Therapeutic Interventions: Skilled ST services focused on education and cognitive skills. SLP facilitated reassessment of cognitive linguistic skills given MOCA version 7.1 pt scored 12 out 30 (n+>26), same score as upon evaluation. Pt continues to demonstrate deficits in orientation, sustained attention/alertness, basic problem solving and emergent awareness. Pt demonstrated awareness of 50% of errors during assessment with attempts to correct. Pt's performance was primarily impacted by reduce alertness, pt fell asleep after 30 minutes, although easily awaken, pt required constant cues to keep his eyes open during session. Pt's wife arrived the last 20 minutes of the treatment session, she supported near cognitive baseline from previous discharge, however appeared worst than prior to admission. SLP provided education of basic problem solving, awareness of reduce alertness, recall strategies (handout provided) and error awareness. All questions were answered to satisfaction. Pt was left in room with wife, call bell within  reach and bed alarm set. ST recommends to continue skilled ST services.      Pain Pain Assessment Pain Scale: 0-10 Pain Score: 0-No pain  Therapy/Group: Individual Therapy  Ellenor Wisniewski  [facility] 09/27/2018, 12:53 PM

## 2018-09-27 NOTE — Progress Notes (Signed)
Speech Language Pathology Discharge Summary  Patient Details  Name: Howard Bell MRN: [id number] Date of Birth: 1965-03-24  Today's Date: 09/28/2018 SLP Individual Time: 9381-8299 SLP Individual Time Calculation (min): 30 min   Skilled Therapeutic Interventions:   SLP facilitated session by reviewing focus of therapies during rehab stay and providing opportunity to ask questions. Reviewed safety strategies and results from ST reassessment yesterday. Poor recall of therapy tasks and rationale for them. Pt exhibits flat affect and poor eye contact. Pt was oriented to time this session. Significant encouragement required for pt to engage with SLP in discussion about DC tomorrow.   Patient has met 5 of 5 long term goals.  Patient to discharge at overall moderate assist level.   Clinical Impression/Discharge Summary:   Pt met 5 out 5 goals discharging at moderate assistance. Pt demonstrates impairments in basic problem solving, emergent awareness, memory and orientation, further impacted by reduced sustained attention/alertness.Pt scored 12 out 30 on MOCA version 7.1 on evaluation date and 7.3 at discharge. Pt demonstrated functional gains in problem solving, sustained attention, recall with aids and awareness in familiar tasks. Education was completed with pt's wife, she supports near baseline pertaining to cognitive deficits, with slight decrease from prior to admission. Pt would benefit from skilled ST services in order to maximize functional independence and reduce burden of care, requiring 24 hour supervision and continue ST services.  Care Partner:  Pt being [place] home with wife.  Recommendation:  Continued skilled ST intervention to maximize function, safety, and independence.  Equipment:    None  Reasons for discharge:    DC from [place]. Quentin Ore, MSP, CCC-SLP Speech Language Pathologist  [place]  [facility] 09/27/2018, 2:18 PM

## 2018-09-27 NOTE — Progress Notes (Addendum)
[facility] Progress Note   Subjective: Seen on HD. Sleeping, easily aroused, No C/Os.    Objective Vitals:   09/27/18 1302 09/27/18 1330 09/27/18 1400 09/27/18 1430  BP: (!) 165/63 (!) 153/63 (!) 133/43 130/61  Pulse: 77 68 78 81  Resp:      Temp:      TempSrc:      SpO2:      Weight: 91 kg     Height:        Physical Exam General:Chronically ill appearing male in NAD Heart:S1,S2 RRR Lungs:CTAB A/P Abdomen:active BS Extremities:L AKA withace wrap.R AKA suture line approximated without drainage. No stump edema Dialysis Access:L AVG blood lines connected    Additional Objective Labs: Basic Metabolic Panel: Recent Labs  Lab 09/20/18 1452 09/22/18 1536 09/25/18 1400  NA 132* 131* 131*  K 4.5 4.5 4.5  CL 95* 93* 93*  CO2 24 24 24   GLUCOSE 101* 152* 115*  BUN 60* 61* 64*  CREATININE 10.60* 10.09* 11.56*  CALCIUM 9.1 8.8* 9.1  PHOS 4.9* 4.8* 4.7*   Liver Function Tests: Recent Labs  Lab 09/20/18 1452 09/22/18 1536 09/25/18 1400  ALBUMIN 2.4* 2.4* 2.4*   No results for input(s): LIPASE, AMYLASE in the last 168 hours. CBC: Recent Labs  Lab 09/20/18 1452 09/22/18 1537 09/24/18 1102 09/25/18 1359  WBC 6.2 8.0 5.9 7.0  HGB 7.2* 7.1* 7.2* 7.3*  HCT 24.0* 23.1* 23.3* 24.3*  MCV 96.8 97.5 98.7 98.8  PLT 246 224 201 233   Blood Culture    Component Value Date/Time   SDES BLOOD LEFT ANTECUBITAL 08/15/2018 1526   SPECREQUEST  08/15/2018 1526    BOTTLES DRAWN AEROBIC ONLY Blood Culture adequate volume   CULT  08/15/2018 1526    NO GROWTH 5 DAYS Performed at [facility], [place] [address] [place], [place] [address]    REPTSTATUS 08/20/2018 FINAL 08/15/2018 1526    Cardiac Enzymes: No results for input(s): CKTOTAL, CKMB, CKMBINDEX, TROPONINI in the last 168 hours. CBG: Recent Labs  Lab 09/26/18 1145 09/26/18 1643 09/26/18 2105 09/27/18 0623 09/27/18 1200  GLUCAP 146* 144* 147* 153* 114*   Iron Studies: No results  for input(s): IRON, TIBC, TRANSFERRIN, FERRITIN in the last 72 hours. @lablastinr3 @ Studies/Results: No results found. Medications: . sodium chloride    . sodium chloride    . sodium chloride    . sodium chloride    . ferric gluconate (FERRLECIT/NULECIT) IV 62.5 mg (09/20/18 1628)   . sodium chloride   Intravenous Once  . sodium chloride   Intravenous Once  . amLODipine  10 mg Oral Daily  . brimonidine  1 drop Both Eyes BID  . chlorhexidine  15 mL Mouth Rinse BID  . Chlorhexidine Gluconate Cloth  6 each Topical Q0600  . darbepoetin (ARANESP) injection - DIALYSIS  200 mcg Intravenous Q Fri-HD  . docusate sodium  100 mg Oral BID  . feeding supplement (NEPRO CARB STEADY)  237 mL Oral Q24H  . feeding supplement (PRO-STAT SUGAR FREE 64)  30 mL Oral BID  . ferric citrate  210 mg Oral TID WC  . fluticasone  1 spray Each Nare Daily  . gabapentin  100 mg Oral QHS  . heparin  5,000 Units Subcutaneous Q8H  . heparin  5,000 Units Dialysis Once in dialysis  . insulin aspart  0-5 Units Subcutaneous QHS  . insulin aspart  0-9 Units Subcutaneous TID WC  . latanoprost  1 drop Both Eyes QHS  .  metoCLOPramide  5 mg Oral TID AC  . metoprolol succinate  50 mg Oral Daily  . multivitamin  1 tablet Oral QHS  . mupirocin ointment   Nasal BID  . pantoprazole  40 mg Oral Daily  . timolol  1 drop Both Eyes BID      Dialysis Orders: AF MWF  4h 88min 450/800 93kg 2/2.25 bath Hep 9000 L AVG - parsabiv 10, iPTH 640 6/17  - Hb 8 at dc 6/10 on home O2 - Mircera 200 given 6/16 - tsat after last hosp d/c 37% - had been offParsabiv during recent hospitalization  Assessment/Plan: 1.Debility - on CIR 2. 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 L AKA on 09/08/2018 for multiple wounds not responding to Rx. 3. ESRD -on HD MWF. HDtoday. Will need to have HD 1st shift 09/29/18 as patient is being discharged.  4. Anemia of  CKD-HGB 7.3 S/P 1 unit of PRBCs 09/25/18. Last ESA dose 09/22/18.  5. Secondary hyperparathyroidism -CCa 10.4, phos 4.7 Stopped Ca Acetate d/t Jamas Lav, started Auryxia 1PO TID. No parsabiv on formularyin hospital.  6. HTN/volume -Will need lower edw on d/c.Pre wt 91 kg UFG 2.0 liters 7. Nutrition -Renal diet with fluid restrictions, vit, protein supplements.  8. DM - per primary  Rita H. Brown NP-C 09/27/2018, 2:44 PM  [facility] [phone]  Pt seen, examined and agree w A/P as above.  Kelly Splinter  MD 09/27/2018, 3:05 PM

## 2018-09-27 NOTE — Patient Care Conference (Signed)
Inpatient RehabilitationTeam Conference and Plan of Care Update Date: 09/27/2018   Time: 2:25 PM    Patient Name: Howard Bell      Medical Record Number: [id number]  Date of Birth: 21-May-1965 Sex: Male         Room/Bed: 4M01C/4M01C-01 Payor Info: Payor: MEDICARE / Plan: MEDICARE PART A AND B / Product Type: *No Product type* /    Admitting Diagnosis: 5. Gen Team B AKA, gangrene; 17-18days  Admit Date/Time:  09/16/2018  1:36 PM Admission Comments: No comment available   Primary Diagnosis:  <principal problem not specified> Principal Problem: <principal problem not specified>  Patient Active Problem List   Diagnosis Date Noted  . Ulcer of finger ([place])   . OSA (obstructive sleep apnea)   . S/P AKA (above knee amputation) unilateral, right ([place])   . S/P AKA (above knee amputation) bilateral ([place])   . Acute on chronic anemia   . Diabetic peripheral neuropathy ([place])   . Pressure injury of skin 09/16/2018  . Status post bilateral above knee amputation ([place]) 09/08/2018  . Acute respiratory failure with hypercapnia ([place])   . Atherosclerosis of native arteries of extremities with gangrene, left leg ([place]) 09/05/2018  . Hypoxia   . ESRD (end stage renal disease) ([place])   . Labile blood glucose   . Postoperative pain   . Dry gangrene (HCC)--left foot/left shin   . SIRS (systemic inflammatory response syndrome) (HCC)   . Poorly controlled type 2 diabetes mellitus with peripheral neuropathy ([place])   . Peripheral arterial disease ([place])   . Unilateral AKA, right ([place]) 08/17/2018  . Sepsis ([place]) 08/15/2018  . Acute metabolic encephalopathy 73/41/9379  . Gangrene of left foot ([place])   . Hypoglycemia   . Drug induced constipation   . Confusion, postoperative   . Labile blood pressure   . Uncontrolled diabetes mellitus type 2 with peripheral artery disease ([place])   . Anemia of chronic disease   . Diabetes mellitus type 2 in nonobese (HCC)   . Supplemental oxygen dependent   . Unilateral  AKA, left ([place]) 08/07/2018  . S/P BKA (below knee amputation), right ([place]) 08/04/2018  . Severe protein-calorie malnutrition ([place]) 08/01/2018  . Dehiscence of amputation stump ([place]) 08/01/2018  . Cutaneous abscess of left foot 08/01/2018  . Cellulitis of right lower extremity   . Influenza A 05/12/2018  . Pneumonia 05/12/2018  . Gangrene of right foot ([place])   . Critical limb ischemia with history of revascularization of same extremity 05/09/2018  . LUQ pain   . 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 ([place]) 11/16/2017  . CKD stage 5 due to type 2 diabetes mellitus ([place]) 11/16/2017  . Glaucoma due to type 2 diabetes mellitus ([place]) 11/16/2017  . Chronic generalized pain 11/16/2017  . Recurrent pneumonia 11/06/2017  . Diabetic gastroparesis ([place]) 11/06/2017  . Generalized abdominal pain 09/13/2017  . Acute pain of right shoulder 07/26/2017  . Acute bursitis of right shoulder 07/26/2017  . Left arm swelling 03/28/2017  . ESRD on dialysis ([place]) 03/28/2017  . Chest pain 11/26/2016  . Essential hypertension 11/26/2016  . Anemia due to end stage renal disease ([place]) 08/04/2016  . Acute respiratory failure with hypoxemia ([place]) 08/04/2016  . Type 2 diabetes mellitus with foot ulcer ([place]) 08/04/2016  . Acute CHF (congestive heart failure) ([place]) 08/04/2016  . Elevated troponin     Expected Discharge  Date: Expected Discharge Date: 09/29/18  Team Members Present: Physician leading conference: Dr. Delice Lesch Social Worker Present: Lennart Pall, LCSW Nurse Present: Rayetta Humphrey, RN PT Present: Georjean Mode, PT OT Present: Roanna Epley, [place], OT SLP Present: Charolett Bumpers, SLP PPS Coordinator present : Gunnar Fusi, SLP     Current Status/Progress Goal Weekly Team Focus  Medical   Functional deficits secondary to PAD, new left AKA (09/08/2018),  recent right AKA (08/04/2018)  Improve mobility, endurance  See above   Bowel/Bladder   Continent of bowel anuric  remain continent of bowel  assess toileting needs qshift and PRN   Swallow/Nutrition/ Hydration             ADL's   LB bathing/dressing-CGA/supervision at bed level, slide board tranfsers-setup; ongoing lethargy, impaired problem solving and safety awareness  supervision overall  educaiotn, bed mobility, functional tranfsers, safety awareness, activity tolerance   Mobility   min-mod A bed mobility, min-CGA SBT, S w/c 150'  S w/c level  Strengthening/ROM, activity tolerance, functional mobiltiy, w/c mobility, balance, patient/family education.   Communication             Safety/Cognition/ Behavioral Observations  Mod A  Mod A - downgraded 09/20/18  education, sustained attention, basic problem solving, orientation and overall awareness   Pain   C/o pain to Left AKA relieved with oxycodone 5-10 q4 PRN  pain less than 3  assess pain management ashift and PRN   Skin   Stage 2 to coccyx OTA. L AKA surgical wound  Qshift dressing change. promote proper wound healing  assess skin qshift and PRN    Rehab Goals Patient on target to meet rehab goals: Yes *See Care Plan and progress notes for long and short-term goals.     Barriers to Discharge  Current Status/Progress Possible Resolutions Date Resolved   Physician    Medical stability;Wound Care;Weight bearing restrictions;Hemodialysis     See above  Therapies, optimize BP/DM meds      Nursing                  PT  Behavior  Patient has variable paticipation in therapies due to behavoral/cognitive factors.  Scheduled family ed for caregiver training and behavioral assessment from baseline.           OT                  SLP                SW                Discharge Planning/Teaching Needs:  Plan for home with wife and 18 y.o daughter to provide 24/7 assistance.  Teaching completed this morning with wife.   Team  Discussion:  No new medical or nursing issues.  fam ed completed and all went well.  On track for goals and ready for d/c end of week.  Revisions to Treatment Plan:  NA    Continued Need for Acute Rehabilitation Level of Care: The patient requires daily medical management by a physician with specialized training in physical medicine and rehabilitation for the following conditions: Daily direction of a multidisciplinary physical rehabilitation program to ensure safe treatment while eliciting the highest outcome that is of practical value to the patient.: Yes Daily medical management of patient stability for increased activity during participation in an intensive rehabilitation regime.: Yes Daily analysis of laboratory values and/or radiology reports with any subsequent need for medication adjustment of  medical intervention for : Post surgical problems;Diabetes problems;Wound care problems;Blood pressure problems;Renal problems   I attest that I was present, lead the team conference, and concur with the assessment and plan of the team.   Lennart Pall 09/27/2018, 3:47 PM    Team conference was held via web/ teleconference due to [place] - 19

## 2018-09-27 NOTE — Progress Notes (Addendum)
Patient found confused x 4 ,agitated, uncooperative, will not follow instructions, states " Im at Mrs Howard Bell house go up stairs and get her now:, attempts to reoriented uncuccessful O2 sat 80% on room air, convinced patient to reapply C-pap sats 92% at 2338, patient is oriented x4,cooperative and calm closely monitor and assisted. Oriented X 4 @ 1720 sats 95%, cooperative, call bell within reach, alarm setting on High monitor,

## 2018-09-28 ENCOUNTER — Inpatient Hospital Stay (HOSPITAL_COMMUNITY): Payer: Medicare Other

## 2018-09-28 ENCOUNTER — Inpatient Hospital Stay (HOSPITAL_COMMUNITY): Payer: Medicare Other | Admitting: Speech Pathology

## 2018-09-28 LAB — GLUCOSE, CAPILLARY
Glucose-Capillary: 104 mg/dL — ABNORMAL HIGH (ref 70–99)
Glucose-Capillary: 144 mg/dL — ABNORMAL HIGH (ref 70–99)
Glucose-Capillary: 153 mg/dL — ABNORMAL HIGH (ref 70–99)
Glucose-Capillary: 202 mg/dL — ABNORMAL HIGH (ref 70–99)

## 2018-09-28 NOTE — Progress Notes (Signed)
[place] PHYSICAL MEDICINE & REHABILITATION PROGRESS NOTE   Subjective/Complaints: Patient seen laying in bed this morning.  States he slept well overnight.  States he is looking for discharge tomorrow.  Discussed stump sock with nursing and therapy.  ROS: Denies CP, shortness of breath, nausea, vomiting, diarrhea.  Objective:   No results found. Recent Labs    09/25/18 1359 09/27/18 1500  WBC 7.0 7.6  HGB 7.3* 8.4*  HCT 24.3* 26.0*  PLT 233 207   Recent Labs    09/25/18 1400 09/27/18 1500  NA 131* 130*  K 4.5 4.4  CL 93* 92*  CO2 24 24  GLUCOSE 115* 103*  BUN 64* 65*  CREATININE 11.56* 10.52*  CALCIUM 9.1 9.3    Intake/Output Summary (Last 24 hours) at 09/28/2018 0927 Last data filed at 09/28/2018 0745 Gross per 24 hour  Intake 340 ml  Output 2000 ml  Net -1660 ml     Physical Exam: Vital Signs Blood pressure 137/60, pulse 84, temperature 98 F (36.7 C), temperature source Oral, resp. rate 16, height 6' (1.829 m), weight 89.7 kg, SpO2 97 %. Constitutional: No distress . Vital signs reviewed. HENT: Normocephalic.  Atraumatic. Eyes: EOMI.  No discharge. Cardiovascular: No JVD. Respiratory: Normal effort.  GI: Non-distended. Musc: Right AKA with edema and tenderness, unchanged Left AKA with edema tenderness, unchanged Neurological: He is alert Motor: Bilateral UE 5/5 proximal to distal.  RLE 4+/5 hip flexion, unchanged LLE: 4--4/5 hip flexion, unchanged Skin: Left AKA with dressing C/D/I Right AKA with dressing C/D/I Psychiatric: Flat  Assessment/Plan: 1. Functional deficits secondary to left AKA which require 3+ hours per day of interdisciplinary therapy in a comprehensive inpatient rehab setting.  Physiatrist is providing close team supervision and 24 hour management of active medical problems listed below.  Physiatrist and rehab team continue to assess barriers to discharge/monitor patient progress toward functional and medical goals  Care  Tool:  Bathing    Body parts bathed by patient: Right arm, Left arm, Chest, Abdomen(UB only this am)   Body parts bathed by helper: Buttocks Body parts n/a: Right lower leg, Left lower leg   Bathing assist Assist Level: Supervision/Verbal cueing     Upper Body Dressing/Undressing Upper body dressing   What is the patient wearing?: Pull over shirt    Upper body assist Assist Level: Independent    Lower Body Dressing/Undressing Lower body dressing      What is the patient wearing?: Pants, Underwear/pull up     Lower body assist Assist for lower body dressing: Contact Guard/Touching assist     Toileting Toileting    Toileting assist Assist for toileting: Moderate Assistance - Patient 50 - 74%     Transfers Chair/bed transfer  Transfers assist  Chair/bed transfer activity did not occur: Safety/medical concerns  Chair/bed transfer assist level: Set up assist Chair/bed transfer assistive device: Sliding board   Locomotion Ambulation   Ambulation assist   Ambulation activity did not occur: N/A          Walk 10 feet activity   Assist  Walk 10 feet activity did not occur: N/A        Walk 50 feet activity   Assist           Walk 150 feet activity   Assist Walk 150 feet activity did not occur: N/A         Walk 10 feet on uneven surface  activity   Assist Walk 10 feet on uneven surfaces activity did  not occur: N/A         Wheelchair     Assist Will patient use wheelchair at discharge?: Yes Type of Wheelchair: Manual    Wheelchair assist level: Supervision/Verbal cueing Max wheelchair distance: 150    Wheelchair 50 feet with 2 turns activity    Assist        Assist Level: Supervision/Verbal cueing   Wheelchair 150 feet activity     Assist     Assist Level: Supervision/Verbal cueing    Medical Problem List and Plan: 1.Functional deficitssecondary to PAD, new left AKA (09/08/2018), recent right AKA  (08/04/2018) Continue CIR  Plan for d/c tomorrow  Will see patient for transitional care management in 1-2 weeks post-discharge 2. Antithrombotics: -DVT/anticoagulation:Pharmaceutical:Heparin -antiplatelet therapy: N/A 3. Pain Management:Oxycodone prn  Added gabapentin for phantom pain LLE, 100mg  qhs  Controlled on 7/9 4. Mood:LCSW to follow for evaluation and support. -antipsychotic agents: N/A 5. Neuropsych: This patientis not fullycapable of making decisions on hisown behalf. 6. Skin/Wound Care:  VAC DC'd on 6/30  Patient refusing dressing changes to right fifth digit ulcer 7. Fluids/Electrolytes/Nutrition:Strict I/O. Daily weights. Labs with HD. 8. OSA/Chronic respiratory failure: Oxygen dependent--2 L during the day and 3 liters at nights.  BiPAP nightly  Continue supplemental oxygen 9. HTN: Monitor BP TID--On Norvasc and metoprolol daily  Relatively controlled on 7/9 10. T2DM with gastroparesis and neuropathy: Brittle diabetic. Continue SSI as at home.   Reglan tid ac for gastroparesis.   CBGs changed back to AC/at bedtime  Labile on 7/9 11. Glaucoma with poor vision: On Timoptic at bedtime and Alphagan bid 12. Acute on chronic anemia: On Aranesp weekly.  Hemoglobin 8.4 on 7/8 after transfusion on 7/6, labs with HD  Continue to monitor 13.  ESRD: HD MWF.  -HD after therapy day to allow participation in rehab activities  -nephrology following, appreciate Recs    LOS: 12 days A FACE TO [place] 09/28/2018, 9:27 AM

## 2018-09-28 NOTE — Progress Notes (Addendum)
Physical Therapy Discharge Summary  Patient Details  Name: Howard Bell MRN: [id number] Date of Birth: [birth year]  Today's Date: 09/28/2018 PT Individual Time: 1545-1610; 1545-1610 PT Individual Time Calculation (min): 25 min , 25 min  Pt missed 45 min AM tx due to refusal.   Patient has met 5 of 6 long term goals due to improved activity tolerance, improved balance, improved postural control, ability to compensate for deficits and functional use of  right lower extremity and left lower extremity.  Patient to discharge at a wheelchair level Supervision.   Patient's care partner is independent to provide the necessary cognitive assistance at discharge.  Reasons goals not met: body habitus; has difficulty arising from supine in flat bed without rails.  Refuses to have hospital bed in home.  Recommendation:  Patient will benefit from ongoing skilled PT services in home health setting to continue to advance safe functional mobility, address ongoing impairments in cognition, bed mobility, activity tolerance, and minimize fall risk.  Equipment: No equipment provided  Reasons for discharge: treatment goals met and discharge from hospital  Patient/family agrees with progress made and goals achieved: Yes  PT Discharge  tx 1: Pt asleep, with [place] in only one nostril.  PT awakened pt by calling his name and informed him that [place] wasn't in  properly.  Pt adjusted [place].  O2 sats 90%, HR 84.  After a few minutes, re-checked; vitals below.  Pt denied pain.  L ACEs inside retention sock were completley off of limb; pt stated "it's supposed to be that way."  R/L residual limbs each wrapped with 2 4" ACES.  Pt assists by holding residual limb up in the air, briefly, until dozing off again, repeatedly. Retention sock re-applied to L.  Dr. Posey Pronto stated that he ordered bil retention socks; [employer] came yesterday evening but pt was in HD. Pt declined participating in any further therapy.  PT informed Maudry Mayhew, RN  who stated that pt removed Bpap x 2 last night, and was very confused.  Wife reported yesterday that this also happens at home.  Bed alarm set and needs left at hand for pt.  Phone call with wife to discuss her interest in a power w/c for him.  Dr. Sharol Given wrote her a Rx for power w/c, which this PT heard about yesterday.   PT discussed pt's current ability to propel w/c and perform pressure relief , benefiting fitness level, blood sugar control, and accessibility.  Power mobility was not trialled during his stay at [facility] due to his abilities with manual w/c, and daily somnolence.  She is unsure if their apartment would be accessible.  PT recommended wife wait and look into this with assistance of HHPT and/or OPPT , PRN. PT asked Lorre Nick, CSW to contact [employer] to change manual w/c to amputee axle.  tx 2:  Pt asleep in bed.  He awakened to name.  Pt reported pain 9/10 LLE , aching.  PT notified Maudry Mayhew, RN about pain.  On 3L O2 via [place]. [employer] had delivered bil retention socks, held around waist with 1 strap.  R sock had been donned over shorts.  Pt rolled L><R repeatedly so that PT could remove bil straps, bil socks and shorts.  PT attempted to educate pt about purpose of retention socks, R vs L sock and donning them, but pt had fallen asleep again.  PT re-donned socks, pulled up high into groin for retention of ACEs, (already in place bil),  then re-donned shorts.  Pt had slid  far down toward foot of bed.  +2 required to move pt up to [facility], due to pt's R shoulder hurting if he tried to use railings.  At end of session, pt resting in bed with needs at hand and alarm set.  Precautions/Restrictions Precautions Precaution Comments: pt frequently removes [place] Restrictions Weight Bearing Restrictions: Yes RLE Weight Bearing: Non weight bearing LLE Weight Bearing: Non weight bearing Vital Signs Therapy Vitals  Pulse Rate: 84 BP: 137/60 Patient Position (if appropriate): Lying Oxygen Therapy SpO2: 97 % O2  Device: Nasal Cannula O2 Flow Rate (L/min): 3 L/min  Vision/Perception - pt states that he wears glasses at all times, but that he needs new ones.  He does not have glasses at CIR.  Pt reports "floaters"; my eyes aren't too good." Perception Perception: Within Functional Limits Praxis Praxis: Intact  Cognition Orientation Level: Oriented to person;Oriented to place;Oriented to situation;Disoriented to time Memory: Impaired Awareness: Impaired Problem Solving: Impaired Safety/Judgment: Impaired Comments: pt has extremely poor insight into his deficits and understanding of residual limb care Sensation Sensation Light Touch: Impaired by gross assessment Additional Comments: decreased sensation near incisions Coordination Gross Motor Movements are Fluid and Coordinated: No Fine Motor Movements are Fluid and Coordinated: No Coordination and Movement Description: coordination impaired secondary to pain and B LE amputations Motor  Motor Motor: Within Functional Limits Motor - Skilled Clinical Observations: generalized weakness  Mobility Bed Mobility Bed Mobility: Rolling Right;Rolling Left Rolling Right: Supervision/verbal cueing Rolling Left: Supervision/Verbal cueing Right Sidelying to Sit: Moderate Assistance - Patient 50-74%(flat bed, no rails) Sit to Supine: Supervision/Verbal cueing Transfers Transfers: Lateral/Scoot Transfers Transfer (Assistive device): Other (Comment)(slide board) Locomotion  Gait Ambulation: No Gait Gait: No Stairs / Additional Locomotion Stairs: No Wheelchair Mobility Wheelchair Mobility: Yes Wheelchair Assistance: Chartered loss adjuster: Both upper extremities Wheelchair Parts Management: Supervision/cueing Distance: 150  Trunk/Postural Assessment  Cervical Assessment Cervical Assessment: Exceptions to WFL(forward head) Thoracic Assessment Thoracic Assessment: Exceptions to WFL(rounded shoulders) Lumbar  Assessment Lumbar Assessment: Exceptions to WFL(posterior pelvic tilt) Postural Control Postural Control: Deficits on evaluation Trunk Control: due to bil AK amputation Righting Reactions: delayed, posterior lean in sitting  Balance Balance Balance Assessed: Yes Static Sitting Balance Static Sitting - Level of Assistance: 5: Stand by assistance Dynamic Sitting Balance Dynamic Sitting - Level of Assistance: 5: Stand by assistance Static Standing Balance Static Standing - Level of Assistance: Not tested (comment) Dynamic Standing Balance Dynamic Standing - Level of Assistance: Not tested (comment) Extremity Assessment  RUE Assessment RUE Assessment: Within Functional Limits LUE Assessment LUE Assessment: Within Functional Limits RLE Assessment RLE Assessment: Exceptions to [facility] Passive Range of Motion (PROM) Comments: limited by pain in residual limb (AKA), tight hip flexors General Strength Comments: grossly 3-/5 at hip LLE Assessment LLE Assessment: Exceptions to [facility] Passive Range of Motion (PROM) Comments: limited by pain in residual limb (AKA), tight hip flexors General Strength Comments: grossly 3-/5 at the hip    Sharonna Vinje 09/28/2018, 4:23 PM

## 2018-09-28 NOTE — Progress Notes (Signed)
Social Work Patient ID: Howard Bell, male   DOB: [birth year], 53 y.o.   MRN: [id number]   Pt and wife aware and agreeable with d/c planned for tomorrow following HD session in morning.  Wife in yesterday for family education.  Will resume HH with [facility]. Currently working with [place] to see if we can add amputee axle plate to existing w/c.  Continue to follow.  Galo Sayed, LCSW

## 2018-09-28 NOTE — Progress Notes (Signed)
Occupational Therapy Discharge Summary  Patient Details  Name: Monroe Qin MRN: [id number] Date of Birth: 02-Sep-1965  Patient has met 3 of 4 long term goals due to improved activity tolerance, balance, and ability to compensate for deficits..  Pt progress was slow and inconsistent during this admission.  Pt continues to exhibit ongoing lethargy and required max verbal cues to actively participate in therapy and keep his eyes open during therapy.  Pt requires mod A for clothing management on BSC with lateral leans but completes all other tasks with supervision.  Pt's wife has participated in therapy and provided the appropriate level of supervision, assistance, and encouragement.Patient to discharge at overall Supervision with bathing bed level, dressing bed level, BSC transfers but needs max A with toileting skills on BSC to assist with clothing management.  Patient's care partner is independent to provide the necessary physical and cognitive assistance at discharge.    Reasons goals not met:  Toileting goal set at S, pt needs max A  Recommendation:  Patient will benefit from ongoing skilled OT services in home health setting to continue to advance functional skills in the area of BADL and Reduce care partner burden.  Equipment: No equipment provided Pt already owns [facility]  Reasons for discharge: treatment goals met  Patient/family agrees with progress made and goals achieved: Yes  OT Discharge Vision Baseline Vision/History: Wears glasses Wears Glasses: At all times Patient Visual Report: Other (comment)(floaters; says his vision is "not too good") Vision Assessment?: No apparent visual deficits Perception  Perception: Within Functional Limits Praxis Praxis: Intact Cognition Overall Cognitive Status: Impaired/Different from baseline Arousal/Alertness: Lethargic Orientation Level: Oriented X4 Attention: Sustained Sustained Attention: Appears intact Sustained Attention  Impairment: Verbal basic;Functional basic Memory: Impaired Memory Impairment: Decreased short term memory;Storage deficit;Decreased recall of new information;Retrieval deficit Decreased Short Term Memory: Verbal complex;Functional complex Awareness: Impaired Awareness Impairment: Emergent impairment Problem Solving: Impaired Problem Solving Impairment: Functional basic;Verbal basic Safety/Judgment: Impaired Comments: pt has extremely poor insight into his deficits, poor recall of therapy tasks/goals and rationale Sensation Sensation Light Touch: Impaired by gross assessment Hot/Cold: Appears Intact Proprioception: Not tested Stereognosis: Not tested Additional Comments: decreased sensation near incisions Coordination Gross Motor Movements are Fluid and Coordinated: No Fine Motor Movements are Fluid and Coordinated: No Motor  Motor Motor - Skilled Clinical Observations: generalized weakness Mobility    S with slide board transfers and bed mobility with hospital bed functions (has a hospital bed at home) Trunk/Postural Assessment  Cervical Assessment Cervical Assessment: Exceptions to WFL(forward head) Thoracic Assessment Thoracic Assessment: Exceptions to WFL(rounded shoulders) Lumbar Assessment Lumbar Assessment: Exceptions to WFL(posterior pelvic tilt) Postural Control Righting Reactions: delayed, posterior lean in sitting  Balance Static Sitting Balance Static Sitting - Level of Assistance: 5: Stand by assistance Dynamic Sitting Balance Dynamic Sitting - Level of Assistance: 5: Stand by assistance Extremity/Trunk Assessment RUE Assessment RUE Assessment: Within Functional Limits LUE Assessment LUE Assessment: Within Functional Limits   Leroy Libman 09/28/2018, 1:31 PM

## 2018-09-28 NOTE — Progress Notes (Signed)
Renal Navigator has notified OP HD clinic/SW of patient's plan for discharge from CIR tomorrow, 7/10 to provide continuity of care.  Howard Bell, [place] Renal Navigator [phone]

## 2018-09-28 NOTE — Progress Notes (Signed)
Occupational Therapy Session Note  Patient Details  Name: Howard Bell MRN: [id number] Date of Birth: 1965/11/05  Today's Date: 09/28/2018 OT Individual Time: 1300-1341 OT Individual Time Calculation (min): 41 min    Short Term Goals: Week 2:  OT Short Term Goal 1 (Week 2): STG=LTG secondary to ELOS  Skilled Therapeutic Interventions/Progress Updates:    OT intervention with focus on bed mobility, sitting balance, discharge planning, education, and safety recommendations to prepare for discharge tomorrow.  Pt continues to required use of bed rails for supine<>sit and rolling R<>L.  When questioned how he will be able to move in bed at home without bed rails, pt states he will just "bump" his wife and she will help.  Pt declined to get OOB this afternoon and states he wishes he could go home today. Pt verbalized understanding of recommendation to always use sliding board for transfers when his wife is present. Pt performed lateral leans in bed with min A for pushing upright. Pt remained in bed with all needs within reach and bed alarm activated.  Therapy Documentation Precautions:  Precautions Precautions: Fall Precaution Comments: pt frequently removes [place] Restrictions Weight Bearing Restrictions: Yes RLE Weight Bearing: Non weight bearing LLE Weight Bearing: Non weight bearing    Pain: Pain Assessment Pain Scale: 0-10 Pain Score: 0-No pain   Therapy/Group: Individual Therapy  Leroy Libman 09/28/2018, 1:46 PM

## 2018-09-29 LAB — RENAL FUNCTION PANEL
Albumin: 2.6 g/dL — ABNORMAL LOW (ref 3.5–5.0)
Anion gap: 13 (ref 5–15)
BUN: 63 mg/dL — ABNORMAL HIGH (ref 6–20)
CO2: 25 mmol/L (ref 22–32)
Calcium: 9.2 mg/dL (ref 8.9–10.3)
Chloride: 94 mmol/L — ABNORMAL LOW (ref 98–111)
Creatinine, Ser: 9.88 mg/dL — ABNORMAL HIGH (ref 0.61–1.24)
GFR calc Af Amer: 6 mL/min — ABNORMAL LOW (ref >60)
GFR calc non Af Amer: 5 mL/min — ABNORMAL LOW (ref >60)
Glucose, Bld: 99 mg/dL (ref 70–99)
Phosphorus: 4.9 mg/dL — ABNORMAL HIGH (ref 2.5–4.6)
Potassium: 4.2 mmol/L (ref 3.5–5.1)
Sodium: 132 mmol/L — ABNORMAL LOW (ref 135–145)

## 2018-09-29 LAB — GLUCOSE, CAPILLARY
Glucose-Capillary: 133 mg/dL — ABNORMAL HIGH (ref 70–99)
Glucose-Capillary: 142 mg/dL — ABNORMAL HIGH (ref 70–99)

## 2018-09-29 LAB — CBC
HCT: 27.3 % — ABNORMAL LOW (ref 39.0–52.0)
Hemoglobin: 8.5 g/dL — ABNORMAL LOW (ref 13.0–17.0)
MCH: 30.9 pg (ref 26.0–34.0)
MCHC: 31.1 g/dL (ref 30.0–36.0)
MCV: 99.3 fL (ref 80.0–100.0)
Platelets: 208 10*3/uL (ref 150–400)
RBC: 2.75 MIL/uL — ABNORMAL LOW (ref 4.22–5.81)
RDW: 17.3 % — ABNORMAL HIGH (ref 11.5–15.5)
WBC: 6.3 10*3/uL (ref 4.0–10.5)
nRBC: 0 % (ref 0.0–0.2)

## 2018-09-29 MED ORDER — DOCUSATE SODIUM 100 MG PO CAPS: 100.0000 mg | ORAL_CAPSULE | Freq: Two times a day (BID) | ORAL | 1 refills | Status: DC

## 2018-09-29 MED ORDER — FERRIC CITRATE 1 GM 210 MG(FE) PO TABS: 210.0000 mg | ORAL_TABLET | Freq: Three times a day (TID) | ORAL | 1 refills | Status: DC

## 2018-09-29 MED ORDER — TRAMADOL HCL 50 MG PO TABS: 50.0000 mg | ORAL_TABLET | Freq: Two times a day (BID) | ORAL | 0 refills | Status: DC | PRN

## 2018-09-29 MED ORDER — OXYCODONE HCL 5 MG PO TABS: 5.0000 mg | ORAL_TABLET | Freq: Three times a day (TID) | ORAL | 0 refills | Status: DC | PRN

## 2018-09-29 MED ORDER — SODIUM CHLORIDE 0.9 % IV SOLN: 100.0000 mL | INTRAVENOUS | Status: DC | PRN

## 2018-09-29 MED ORDER — AMLODIPINE BESYLATE 10 MG PO TABS: 10.0000 mg | ORAL_TABLET | Freq: Every day | ORAL | 1 refills | Status: DC

## 2018-09-29 MED ORDER — DARBEPOETIN ALFA 200 MCG/0.4ML IJ SOSY
PREFILLED_SYRINGE | INTRAMUSCULAR | Status: AC
  Administered 2018-09-29: 200 ug via INTRAVENOUS
  Filled 2018-09-29: qty 0.4

## 2018-09-29 MED ORDER — GABAPENTIN 100 MG PO CAPS: 100.0000 mg | ORAL_CAPSULE | Freq: Every day | ORAL | 1 refills | Status: DC

## 2018-09-29 MED ORDER — RENA-VITE PO TABS: 1.0000 | ORAL_TABLET | Freq: Every day | ORAL | 1 refills | Status: DC

## 2018-09-29 NOTE — Progress Notes (Signed)
Social Work Discharge Note   The overall goal for the admission was met for:   Discharge location: Yes - home with wife who will resume 24/7 assistance  Length of Stay: Yes - 13 days  Discharge activity level: Yes - supervision w/c level  Home/community participation: Yes  Services provided included: MD, RD, PT, OT, SLP, RN, Pharmacy and SW  Financial Services: Medicare  Follow-up services arranged: Home Health: RN, PT, OT, ST via [facility], DME: amputee axle plate for existing wheelchair via [place] and Patient/Family has no preference for HH/DME agencies  Comments (or additional information):     Contact info:  Wife, Kenney Houseman @ [phone]  Patient/Family verbalized understanding of follow-up arrangements: Yes  Individual responsible for coordination of the follow-up plan: pt/ wife  Confirmed correct DME delivered: Lennart Pall 09/29/2018    Crystin Lechtenberg, Lorre Nick

## 2018-09-29 NOTE — Progress Notes (Addendum)
[facility] Progress Note   Subjective: Seen on HD. Excited to be going home. Stump drsgs in bed with pt. No C/Os.     Objective Vitals:   09/29/18 0845 09/29/18 0900 09/29/18 0915 09/29/18 0930  BP: 132/78 139/74 (!) 141/85 139/70  Pulse: 80 78 81 80  Resp: 16 16 16 13   Temp:      TempSrc:      SpO2:      Weight:      Height:       Physical Exam General: Chronically ill appearing male in NAD Heart: S1,S2 RRR Lungs: CTAB Abdomen: S, NT Extremities: Bilateral AKA-erythema long suture line L AKA, scabs along suture line R AKA. No stump edema.  Dialysis Access: L AVG blood lines connected.    Additional Objective Labs: Basic Metabolic Panel: Recent Labs  Lab 09/25/18 1400 09/27/18 1500 09/29/18 0828  NA 131* 130* 132*  K 4.5 4.4 4.2  CL 93* 92* 94*  CO2 24 24 25   GLUCOSE 115* 103* 99  BUN 64* 65* 63*  CREATININE 11.56* 10.52* 9.88*  CALCIUM 9.1 9.3 9.2  PHOS 4.7* 5.2* 4.9*   Liver Function Tests: Recent Labs  Lab 09/25/18 1400 09/27/18 1500 09/29/18 0828  ALBUMIN 2.4* 2.5* 2.6*   No results for input(s): LIPASE, AMYLASE in the last 168 hours. CBC: Recent Labs  Lab 09/22/18 1537 09/24/18 1102 09/25/18 1359 09/27/18 1500 09/29/18 0829  WBC 8.0 5.9 7.0 7.6 6.3  HGB 7.1* 7.2* 7.3* 8.4* 8.5*  HCT 23.1* 23.3* 24.3* 26.0* 27.3*  MCV 97.5 98.7 98.8 99.2 99.3  PLT 224 201 233 207 208   Blood Culture    Component Value Date/Time   SDES BLOOD LEFT ANTECUBITAL 08/15/2018 1526   SPECREQUEST  08/15/2018 1526    BOTTLES DRAWN AEROBIC ONLY Blood Culture adequate volume   CULT  08/15/2018 1526    NO GROWTH 5 DAYS Performed at [facility] [address] [place], [place] [address]    REPTSTATUS 08/20/2018 FINAL 08/15/2018 1526    Cardiac Enzymes: No results for input(s): CKTOTAL, CKMB, CKMBINDEX, TROPONINI in the last 168 hours. CBG: Recent Labs  Lab 09/28/18 0632 09/28/18 1057 09/28/18 1614 09/28/18 2152 09/29/18 0635   GLUCAP 104* 144* 153* 202* 133*   Iron Studies: No results for input(s): IRON, TIBC, TRANSFERRIN, FERRITIN in the last 72 hours. @lablastinr3 @ Studies/Results: No results found. Medications: . sodium chloride    . sodium chloride    . sodium chloride    . sodium chloride    . sodium chloride    . sodium chloride    . ferric gluconate (FERRLECIT/NULECIT) IV 62.5 mg (09/27/18 1553)   . sodium chloride   Intravenous Once  . sodium chloride   Intravenous Once  . amLODipine  10 mg Oral Daily  . brimonidine  1 drop Both Eyes BID  . chlorhexidine  15 mL Mouth Rinse BID  . Chlorhexidine Gluconate Cloth  6 each Topical Q0600  . darbepoetin (ARANESP) injection - DIALYSIS  200 mcg Intravenous Q Fri-HD  . docusate sodium  100 mg Oral BID  . feeding supplement (NEPRO CARB STEADY)  237 mL Oral Q24H  . feeding supplement (PRO-STAT SUGAR FREE 64)  30 mL Oral BID  . ferric citrate  210 mg Oral TID WC  . fluticasone  1 spray Each Nare Daily  . gabapentin  100 mg Oral QHS  . heparin  5,000 Units Subcutaneous Q8H  . heparin  5,000 Units Dialysis  Once in dialysis  . insulin aspart  0-5 Units Subcutaneous QHS  . insulin aspart  0-9 Units Subcutaneous TID WC  . latanoprost  1 drop Both Eyes QHS  . metoCLOPramide  5 mg Oral TID AC  . metoprolol succinate  50 mg Oral Daily  . multivitamin  1 tablet Oral QHS  . mupirocin ointment   Nasal BID  . pantoprazole  40 mg Oral Daily  . timolol  1 drop Both Eyes BID     Dialysis Orders: [facility] MWF  4h 22min 450/800 93kg 2/2.25 bath Hep 9000 L AVG - parsabiv 10, iPTH 640 6/17  - Hb 8 at dc 6/10 on home O2 - Mircera 200 given 6/16 - tsat after last hosp d/c 37% - had been offParsabiv during recent hospitalization  Assessment/Plan: 1.Debility - on CIR 2. 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 L AKA on 09/08/2018 for multiple wounds not responding to Rx. 3. ESRD -on HD  MWF. HD today. K+4.2. Using tight heparin.  4. Anemia of CKD-HGB 8.5 S/P 1 unit of PRBCs 09/25/18.Aranesp 200 mcg IV with HD today.   5. Secondary hyperparathyroidism -CCa 10.4, phos 4.9 Stopped Ca Acetate d/t Jam[place] Lav, started Auryxia 1PO TID. No parsabiv on formularyin hospital. Will resume parsabiv as OP.  6. HTN/volume -Will need lower edw on d/c.Pre Wt 90.6 UFG 2.5 liters.  7. Nutrition -Albumin 2.6. Renal diet with fluid restrictions, vit, protein supplements.  8. DM - per primary  Rita H. Brown NP-C 09/29/2018, 9:37 AM  [facility] [phone]  Pt seen, examined and agree w A/P as above.  Kelly Splinter  MD 09/29/2018, 3:06 PM  ]

## 2018-09-29 NOTE — Discharge Instructions (Signed)
Inpatient Rehab Discharge Instructions  Einer Meals Discharge date and time: 09/28/28   Activities/Precautions/ Functional Status: Activity: activity as tolerated Diet: diabetic diet and renal diet Wound Care: keep wound clean and dry. Contact MD if you develop any problems with your incision/wound--redness, swelling, increase in pain, drainage or if you develop fever or chills.   Functional status:   ___ No restrictions     ___ Walk up steps independently _X__ 24/7 supervision/assistance   ___ Walk up steps with assistance ___ Intermittent supervision/assistance  ___ Bathe/dress independently ___ Walk with walker     _X__ Bathe/dress with assistance ___ Walk Independently    ___ Shower independently ___ Walk with assistance    ___ Shower with assistance _X__ No alcohol     ___ Return to work/school ________   Special Instructions: 1. Monitor blood sugars before meals and at bedtime. Use sliding scale insulin for meals if blood sugar is over 80 and he eats over 50% of his meals.  2. Use BIPAP at nights with 3 liters oxygen bleed in.    COMMUNITY REFERRALS UPON DISCHARGE:    Home Health:   PT     OT     ST    RN                      Agency:  [facility]    Phone: [phone]     My questions have been answered and I understand these instructions. I will adhere to these goals and the provided educational materials after my discharge from the hospital.  Patient/Caregiver Signature _______________________________ Date __________  Clinician Signature _______________________________________ Date __________  Please bring this form and your medication list with you to all your follow-up doctor's appointments.

## 2018-09-29 NOTE — Progress Notes (Signed)
Pt. Got d/c instructions and equipment,pt is ready to go home with his family.

## 2018-10-01 DIAGNOSIS — E1122 Type 2 diabetes mellitus with diabetic chronic kidney disease: Secondary | ICD-10-CM | POA: Diagnosis not present

## 2018-10-01 DIAGNOSIS — L97828 Non-pressure chronic ulcer of other part of left lower leg with other specified severity: Secondary | ICD-10-CM | POA: Diagnosis not present

## 2018-10-01 DIAGNOSIS — L97528 Non-pressure chronic ulcer of other part of left foot with other specified severity: Secondary | ICD-10-CM | POA: Diagnosis not present

## 2018-10-01 DIAGNOSIS — I872 Venous insufficiency (chronic) (peripheral): Secondary | ICD-10-CM | POA: Diagnosis not present

## 2018-10-01 DIAGNOSIS — T8743 Infection of amputation stump, right lower extremity: Secondary | ICD-10-CM | POA: Diagnosis not present

## 2018-10-01 DIAGNOSIS — E1151 Type 2 diabetes mellitus with diabetic peripheral angiopathy without gangrene: Secondary | ICD-10-CM | POA: Diagnosis not present

## 2018-10-02 DIAGNOSIS — N2581 Secondary hyperparathyroidism of renal origin: Secondary | ICD-10-CM | POA: Diagnosis not present

## 2018-10-02 DIAGNOSIS — T8743 Infection of amputation stump, right lower extremity: Secondary | ICD-10-CM | POA: Diagnosis not present

## 2018-10-02 DIAGNOSIS — Z89511 Acquired absence of right leg below knee: Secondary | ICD-10-CM | POA: Diagnosis not present

## 2018-10-02 DIAGNOSIS — J9612 Chronic respiratory failure with hypercapnia: Secondary | ICD-10-CM

## 2018-10-02 DIAGNOSIS — Z794 Long term (current) use of insulin: Secondary | ICD-10-CM | POA: Diagnosis not present

## 2018-10-02 DIAGNOSIS — Z9981 Dependence on supplemental oxygen: Secondary | ICD-10-CM | POA: Diagnosis not present

## 2018-10-02 DIAGNOSIS — E1142 Type 2 diabetes mellitus with diabetic polyneuropathy: Secondary | ICD-10-CM | POA: Diagnosis not present

## 2018-10-02 DIAGNOSIS — I872 Venous insufficiency (chronic) (peripheral): Secondary | ICD-10-CM | POA: Diagnosis not present

## 2018-10-02 DIAGNOSIS — E1122 Type 2 diabetes mellitus with diabetic chronic kidney disease: Secondary | ICD-10-CM | POA: Diagnosis not present

## 2018-10-02 DIAGNOSIS — I509 Heart failure, unspecified: Secondary | ICD-10-CM | POA: Diagnosis not present

## 2018-10-02 DIAGNOSIS — D631 Anemia in chronic kidney disease: Secondary | ICD-10-CM | POA: Diagnosis not present

## 2018-10-02 DIAGNOSIS — I130 Hypertensive heart and chronic kidney disease with heart failure and stage 1 through stage 4 chronic kidney disease, or unspecified chronic kidney disease: Secondary | ICD-10-CM | POA: Diagnosis not present

## 2018-10-02 DIAGNOSIS — Z89611 Acquired absence of right leg above knee: Secondary | ICD-10-CM | POA: Diagnosis not present

## 2018-10-02 DIAGNOSIS — Z992 Dependence on renal dialysis: Secondary | ICD-10-CM | POA: Diagnosis not present

## 2018-10-02 DIAGNOSIS — N186 End stage renal disease: Secondary | ICD-10-CM | POA: Diagnosis not present

## 2018-10-02 DIAGNOSIS — L97528 Non-pressure chronic ulcer of other part of left foot with other specified severity: Secondary | ICD-10-CM | POA: Diagnosis not present

## 2018-10-02 DIAGNOSIS — E1151 Type 2 diabetes mellitus with diabetic peripheral angiopathy without gangrene: Secondary | ICD-10-CM | POA: Diagnosis not present

## 2018-10-02 DIAGNOSIS — Z4781 Encounter for orthopedic aftercare following surgical amputation: Secondary | ICD-10-CM | POA: Diagnosis not present

## 2018-10-02 DIAGNOSIS — Z89612 Acquired absence of left leg above knee: Secondary | ICD-10-CM | POA: Diagnosis not present

## 2018-10-02 DIAGNOSIS — L97828 Non-pressure chronic ulcer of other part of left lower leg with other specified severity: Secondary | ICD-10-CM | POA: Diagnosis not present

## 2018-10-02 DIAGNOSIS — H409 Unspecified glaucoma: Secondary | ICD-10-CM | POA: Diagnosis not present

## 2018-10-02 NOTE — Discharge Summary (Addendum)
Physician Discharge Summary  Patient ID: Howard Bell MRN: [id number] DOB/AGE: 1966-01-25 53 y.o.  Admit date: 09/16/2018 Discharge date: 09/29/2018  Discharge Diagnoses:  Principal Problem:   Unilateral AKA, left (HCC) Active Problems:   ESRD on dialysis ([place])   S/P AKA (above knee amputation) unilateral, right (HCC)   Acute on chronic anemia   Diabetic peripheral neuropathy (HCC)   OSA (obstructive sleep apnea)   Ulcer of finger (HCC)   Chronic hypercapnic respiratory failure (HCC)   Discharged Condition:   Stable   Significant Diagnostic Studies: N/A   Labs:  Basic Metabolic Panel: Recent Labs  Lab 09/25/18 1400 09/27/18 1500 09/29/18 0828  NA 131* 130* 132*  K 4.5 4.4 4.2  CL 93* 92* 94*  CO2 24 24 25   GLUCOSE 115* 103* 99  BUN 64* 65* 63*  CREATININE 11.56* 10.52* 9.88*  CALCIUM 9.1 9.3 9.2  PHOS 4.7* 5.2* 4.9*    CBC: Recent Labs  Lab 09/25/18 1359 09/27/18 1500 09/29/18 0829  WBC 7.0 7.6 6.3  HGB 7.3* 8.4* 8.5*  HCT 24.3* 26.0* 27.3*  MCV 98.8 99.2 99.3  PLT 233 207 208    CBG: Recent Labs  Lab 09/28/18 1057 09/28/18 1614 09/28/18 2152 09/29/18 0635 09/29/18 1201  GLUCAP 144* 153* 202* 133* 142*    Brief HPI:   [name] is a 53 year old male with history of T2DM with neuropathy and retinopathy, ESRD-hemodialysis MWF, OSA with chronic hypoxic respiratory failure, PVD with gangrenous changes BLE s/p right AKA with recent CIR stay.  He was admitted on 09/08/2018 for left AKA due to progressive gangrenous changes.  Postop had issues with acute on chronic respiratory failure as well as hypotension requiring pressors.  He was extubated to BiPAP on 6/20 and mentation is slowly improving.  Acute on chronic anemia has been monitored as patient is symptomatic.  Therapy evaluations completed revealing decline in functional status and CIR was recommended for follow-up therapy   Hospital Course: Terius Jacuinde was admitted to rehab  09/16/2018 for inpatient therapies to consist of PT, ST and OT at least three hours five days a week. Past admission physiatrist, therapy team and rehab RN have worked together to provide customized collaborative inpatient rehab.  His respiratory status has been stable and he has been compliant with BiPAP use at night.  He continues on supplemental oxygen during the day.  Wound VAC was DC'd on 6/30 and left AKA site is clean dry and intact.  Diabetes has been monitored with ac/hs CBG checks and blood sugars have been labile.  Sliding scale insulin use for elevated blood sugars due to tendency to drop drastically with tight blood sugar control.  Hemodialysis has been ongoing on MWF at the end of the day to help with tolerance of therapy.  Acute on chronic anemia was treated with 1 unit packed red blood cells on 07/06 as well as Aranesp IV.  Calcium acetate was DC'd due to elevated calcium and he was started anorexia tid.  Blood pressures have been monitored on a yearly basis and has been stable.  Protein supplements ordered due to protein calorie malnutrition.  Right fifth digit ulcer is clean and dry however patient has been refusing dressing change changes for care.  He has progressed to supervision level and will will continue to receive follow-up home health PT, OT, speech therapy, and RN by [facility] after discharge.   Rehab course: During patient's stay in rehab weekly team conferences were held to monitor patient's progress,  set goals and discuss barriers to discharge. At admission, patient required max assist with ADL task and mod assist with mobility.  He  has had improvement in activity tolerance, balance, postural control as well as ability to compensate for deficits.  He is able to complete bathing and dressing at bed level with supervision.  He requires max assist for clothing management post toileting. He he requires mod assist with bed mobility and is able to complete transfers with use of  sliding board and supervision.   Disposition: Home   Diet: Renal/Carb modified.   Special Instructions: 1. Monitor BS ac/hs and continue SSI. 2. Needs to wear BIPAP when napping and at bedtime. 3. Oxygen 2 L during the day and 3 L bled in at nights.    Allergies as of 09/29/2018   No Known Allergies     Medication List    STOP taking these medications   calcium acetate 667 MG capsule Commonly known as: PHOSLO   methocarbamol 500 MG tablet Commonly known as: ROBAXIN   sevelamer carbonate 800 MG tablet Commonly known as: RENVELA   silver sulfADIAZINE 1 % cream Commonly known as: SILVADENE     TAKE these medications   acetaminophen 325 MG tablet Commonly known as: TYLENOL Take 1-2 tablets (325-650 mg total) by mouth every 4 (four) hours as needed for mild pain.   Admelog SoloStar 100 UNIT/ML KwikPen Generic drug: insulin lispro Inject 1-9 Units into the skin 3 (three) times daily. If bs is 121-150=1 unit, 151-200=2 units, 201-250=3 units, 251-300=5 units, 301-350=7 units, 351-400=9 units   albuterol 108 (90 Base) MCG/ACT inhaler Commonly known as: VENTOLIN HFA Inhale 1 puff into the lungs every 4 (four) hours as needed for wheezing or shortness of breath.   amLODipine 10 MG tablet Commonly known as: NORVASC Take 1 tablet (10 mg total) by mouth daily.   brimonidine 0.2 % ophthalmic solution Commonly known as: ALPHAGAN Place 1 drop into both eyes 2 (two) times daily.   docusate sodium 100 MG capsule Commonly known as: COLACE Take 1 capsule (100 mg total) by mouth 2 (two) times daily.   ferric citrate 1 GM 210 MG(Fe) tablet Commonly known as: AURYXIA Take 1 tablet (210 mg total) by mouth 3 (three) times daily with meals.   fluticasone 50 MCG/ACT nasal spray Commonly known as: FLONASE Place 1 spray into both nostrils daily.   gabapentin 100 MG capsule Commonly known as: NEURONTIN Take 1 capsule (100 mg total) by mouth at bedtime.   latanoprost 0.005 %  ophthalmic solution Commonly known as: XALATAN Place 1 drop into both eyes at bedtime.   metoCLOPramide 5 MG tablet Commonly known as: Reglan Take 1 tablet (5 mg total) by mouth 3 (three) times daily before meals.   metoprolol succinate 50 MG 24 hr tablet Commonly known as: TOPROL-XL Take 1 tablet (50 mg total) by mouth at bedtime. Take with a meal.   multivitamin Tabs tablet Take 1 tablet by mouth at bedtime.   omeprazole 20 MG capsule Commonly known as: PRILOSEC Take 1 capsule (20 mg total) by mouth at bedtime.   oxyCODONE 5 MG immediate release tablet--Rx # 30 pills Commonly known as: Oxy IR/ROXICODONE Take 1-2 tablets (5-10 mg total) by mouth every 8 (eight) hours as needed for severe pain.   timolol 0.5 % ophthalmic solution Commonly known as: BETIMOL Place 1 drop into both eyes 2 (two) times daily.   traMADol 50 MG tablet--Rx # 28 pills Commonly known as: ULTRAM Take 1  tablet (50 mg total) by mouth every 12 (twelve) hours as needed for moderate pain.      Follow-up Information    Newt Minion, MD Follow up.   Specialty: Orthopedic Surgery Contact information: [place] [address] [phone]        Horald Pollen, MD Follow up in 2 week(s).   Specialty: Internal Medicine Why: for hospital follow up visit Contact information: [place] [address] [phone]        Jamse Arn, MD Follow up.   Specialty: Physical Medicine and Rehabilitation Why: Office will call you with follow up appointment Contact information: [place] [address] [phone]           Signed: Bary Leriche 10/02/2018, 9:46 AM Patient seen and examined by me on day of discharge. Delice Lesch, MD, ABPMR

## 2018-10-03 ENCOUNTER — Telehealth: Payer: Self-pay | Admitting: *Deleted

## 2018-10-03 DIAGNOSIS — E1122 Type 2 diabetes mellitus with diabetic chronic kidney disease: Secondary | ICD-10-CM | POA: Diagnosis not present

## 2018-10-03 DIAGNOSIS — I872 Venous insufficiency (chronic) (peripheral): Secondary | ICD-10-CM | POA: Diagnosis not present

## 2018-10-03 DIAGNOSIS — E1151 Type 2 diabetes mellitus with diabetic peripheral angiopathy without gangrene: Secondary | ICD-10-CM | POA: Diagnosis not present

## 2018-10-03 DIAGNOSIS — I509 Heart failure, unspecified: Secondary | ICD-10-CM | POA: Diagnosis not present

## 2018-10-03 DIAGNOSIS — I130 Hypertensive heart and chronic kidney disease with heart failure and stage 1 through stage 4 chronic kidney disease, or unspecified chronic kidney disease: Secondary | ICD-10-CM | POA: Diagnosis not present

## 2018-10-03 DIAGNOSIS — Z4781 Encounter for orthopedic aftercare following surgical amputation: Secondary | ICD-10-CM | POA: Diagnosis not present

## 2018-10-03 NOTE — Telephone Encounter (Signed)
Transitional Care call-I spoke with his wife [name]    1. Are you/is patient experiencing any problems since coming home? Are there any questions regarding any aspect of care? NO 2. Are there any questions regarding medications administration/dosing? Are meds being taken as prescribed? Patient should review meds with caller to confirm  NO questions, they have everything but his pain medications which they will get tomorrow. He has not really needed to take anything.  I have instructed to bring to the appointment. 3. Have there been any falls? NO 4. Has Home Health been to the house and/or have they contacted you? If not, have you tried to contact them? Can we help you contact them? YES 5. Are bowels and bladder emptying properly? Are there any unexpected incontinence issues? If applicable, is patient following bowel/bladder programs? NO  6. Any fevers, problems with breathing, unexpected pain? NO 7. Are there any skin problems or new areas of breakdown? NO 8. Has the patient/family member arranged specialty MD follow up (ie cardiology/neurology/renal/surgical/etc)?  Can we help arrange? I have given appt with our NP Danella Sensing since he has conflict with Dr Serita Grit available appointment. He has dialysis MWF 9. Does the patient need any other services or support that we can help arrange? NO 10. Are caregivers following through as expected in assisting the patient? YES 11. Has the patient quit smoking, drinking alcohol, or using drugs as recommended? N/A  Appointment Tuesday 10/10/18 @8 :40 arrive by 8:20 to see Danella Sensing NP Address reviewed. [place]

## 2018-10-04 DIAGNOSIS — N186 End stage renal disease: Secondary | ICD-10-CM | POA: Diagnosis not present

## 2018-10-04 DIAGNOSIS — E1129 Type 2 diabetes mellitus with other diabetic kidney complication: Secondary | ICD-10-CM | POA: Diagnosis not present

## 2018-10-04 DIAGNOSIS — N2581 Secondary hyperparathyroidism of renal origin: Secondary | ICD-10-CM | POA: Diagnosis not present

## 2018-10-04 DIAGNOSIS — D631 Anemia in chronic kidney disease: Secondary | ICD-10-CM | POA: Diagnosis not present

## 2018-10-05 DIAGNOSIS — I130 Hypertensive heart and chronic kidney disease with heart failure and stage 1 through stage 4 chronic kidney disease, or unspecified chronic kidney disease: Secondary | ICD-10-CM | POA: Diagnosis not present

## 2018-10-05 DIAGNOSIS — E1122 Type 2 diabetes mellitus with diabetic chronic kidney disease: Secondary | ICD-10-CM | POA: Diagnosis not present

## 2018-10-05 DIAGNOSIS — I509 Heart failure, unspecified: Secondary | ICD-10-CM | POA: Diagnosis not present

## 2018-10-05 DIAGNOSIS — Z4781 Encounter for orthopedic aftercare following surgical amputation: Secondary | ICD-10-CM | POA: Diagnosis not present

## 2018-10-05 DIAGNOSIS — I872 Venous insufficiency (chronic) (peripheral): Secondary | ICD-10-CM | POA: Diagnosis not present

## 2018-10-05 DIAGNOSIS — E1151 Type 2 diabetes mellitus with diabetic peripheral angiopathy without gangrene: Secondary | ICD-10-CM | POA: Diagnosis not present

## 2018-10-06 ENCOUNTER — Telehealth: Payer: Self-pay | Admitting: Orthopedic Surgery

## 2018-10-06 ENCOUNTER — Encounter: Payer: Medicare Other | Admitting: Physical Medicine & Rehabilitation

## 2018-10-06 DIAGNOSIS — N186 End stage renal disease: Secondary | ICD-10-CM | POA: Diagnosis not present

## 2018-10-06 DIAGNOSIS — D631 Anemia in chronic kidney disease: Secondary | ICD-10-CM | POA: Diagnosis not present

## 2018-10-06 DIAGNOSIS — N2581 Secondary hyperparathyroidism of renal origin: Secondary | ICD-10-CM | POA: Diagnosis not present

## 2018-10-06 NOTE — Telephone Encounter (Signed)
Received voicemail message from Costella Hatcher with [facility] needing verbal order for (OT) 1 month 2 frequency and a Rx for a  trapeze bar sent out for patient. The number to contact Clair Gulling is [phone]

## 2018-10-09 ENCOUNTER — Telehealth: Payer: Self-pay | Admitting: *Deleted

## 2018-10-09 DIAGNOSIS — D631 Anemia in chronic kidney disease: Secondary | ICD-10-CM | POA: Diagnosis not present

## 2018-10-09 DIAGNOSIS — N2581 Secondary hyperparathyroidism of renal origin: Secondary | ICD-10-CM | POA: Diagnosis not present

## 2018-10-09 DIAGNOSIS — N186 End stage renal disease: Secondary | ICD-10-CM | POA: Diagnosis not present

## 2018-10-09 NOTE — Telephone Encounter (Signed)
[name] called to let us know she did the eval on Mr Howard Bell and his cognitive deficits are at baseline so no further visits will be made.

## 2018-10-10 ENCOUNTER — Encounter: Payer: Medicare Other | Attending: Registered Nurse | Admitting: Registered Nurse

## 2018-10-10 ENCOUNTER — Encounter: Payer: Self-pay | Admitting: Registered Nurse

## 2018-10-10 ENCOUNTER — Ambulatory Visit (INDEPENDENT_AMBULATORY_CARE_PROVIDER_SITE_OTHER): Payer: Medicare Other | Admitting: Family

## 2018-10-10 ENCOUNTER — Encounter: Payer: Self-pay | Admitting: Family

## 2018-10-10 ENCOUNTER — Other Ambulatory Visit: Payer: Self-pay

## 2018-10-10 VITALS — Ht 72.0 in | Wt 215.0 lb

## 2018-10-10 VITALS — BP 164/90 | HR 83 | Ht 72.0 in | Wt 215.0 lb

## 2018-10-10 DIAGNOSIS — I132 Hypertensive heart and chronic kidney disease with heart failure and with stage 5 chronic kidney disease, or end stage renal disease: Secondary | ICD-10-CM | POA: Insufficient documentation

## 2018-10-10 DIAGNOSIS — Z4781 Encounter for orthopedic aftercare following surgical amputation: Secondary | ICD-10-CM | POA: Insufficient documentation

## 2018-10-10 DIAGNOSIS — Z89611 Acquired absence of right leg above knee: Secondary | ICD-10-CM | POA: Insufficient documentation

## 2018-10-10 DIAGNOSIS — E1143 Type 2 diabetes mellitus with diabetic autonomic (poly)neuropathy: Secondary | ICD-10-CM | POA: Diagnosis not present

## 2018-10-10 DIAGNOSIS — Z89612 Acquired absence of left leg above knee: Secondary | ICD-10-CM | POA: Diagnosis not present

## 2018-10-10 DIAGNOSIS — G4730 Sleep apnea, unspecified: Secondary | ICD-10-CM | POA: Diagnosis not present

## 2018-10-10 DIAGNOSIS — E1122 Type 2 diabetes mellitus with diabetic chronic kidney disease: Secondary | ICD-10-CM | POA: Insufficient documentation

## 2018-10-10 DIAGNOSIS — Z89511 Acquired absence of right leg below knee: Secondary | ICD-10-CM

## 2018-10-10 DIAGNOSIS — I70262 Atherosclerosis of native arteries of extremities with gangrene, left leg: Secondary | ICD-10-CM

## 2018-10-10 DIAGNOSIS — Z992 Dependence on renal dialysis: Secondary | ICD-10-CM | POA: Diagnosis not present

## 2018-10-10 DIAGNOSIS — I509 Heart failure, unspecified: Secondary | ICD-10-CM | POA: Diagnosis not present

## 2018-10-10 DIAGNOSIS — E1142 Type 2 diabetes mellitus with diabetic polyneuropathy: Secondary | ICD-10-CM | POA: Insufficient documentation

## 2018-10-10 DIAGNOSIS — N186 End stage renal disease: Secondary | ICD-10-CM | POA: Diagnosis not present

## 2018-10-10 DIAGNOSIS — S78112A Complete traumatic amputation at level between left hip and knee, initial encounter: Secondary | ICD-10-CM

## 2018-10-10 DIAGNOSIS — Z833 Family history of diabetes mellitus: Secondary | ICD-10-CM | POA: Diagnosis not present

## 2018-10-10 DIAGNOSIS — Z8249 Family history of ischemic heart disease and other diseases of the circulatory system: Secondary | ICD-10-CM | POA: Diagnosis not present

## 2018-10-10 DIAGNOSIS — K3184 Gastroparesis: Secondary | ICD-10-CM | POA: Insufficient documentation

## 2018-10-10 DIAGNOSIS — S78111A Complete traumatic amputation at level between right hip and knee, initial encounter: Secondary | ICD-10-CM

## 2018-10-10 NOTE — Telephone Encounter (Signed)
Patient OT was called in and given verbal okay for orders per Dr Sharol Given. Rx was given to wife for trapeze bar.

## 2018-10-10 NOTE — Progress Notes (Signed)
Subjective:    Patient ID: Howard Bell, male    DOB: 05-08-1965, 53 y.o.   MRN: [id number]  HPI: Howard Bell is a 53 y.o. male who is here for Transitional care visit in follow up of his unilateral AKA left, s/p AKA right, diabetic peripheral neuropathy and ESRD on hemodialysis. He was admitted on 09/08/2018 for progressive gangrenous changes of left lower extremity which have failed to improve. He underwent Left BKA by Dr Sharol Given on 09/08/2018.   He was admitted to inpatient rehabilitation on 09/16/2018 and discharged home on 09/29/2018. He is receiving outpatient therapy with [facility].   Wife in room all questions answered.   Pain Inventory Average Pain 10 Pain Right Now 2 My pain is intermittent  In the last 24 hours, has pain interfered with the following? General activity 2 Relation with others 2 Enjoyment of life 2 What TIME of day is your pain at its worst? night Sleep (in general) Fair  Pain is worse with: sitting Pain improves with: rest and medication Relief from Meds: 3  Mobility use a wheelchair  Function disabled: date disabled . I need assistance with the following:  bathing and toileting  Neuro/Psych tingling spasms depression anxiety  Prior Studies Any changes since last visit?  no  Physicians involved in your care Any changes since last visit?  no   Family History  Problem Relation Age of Onset  . Diabetes Mother   . Hypertension Father    Social History   Socioeconomic History  . Marital status: Married    Spouse name: Not on file  . Number of children: Not on file  . Years of education: Not on file  . Highest education level: Not on file  Occupational History  . Not on file  Social Needs  . Financial resource strain: Not on file  . Food insecurity    Worry: Not on file    Inability: Not on file  . Transportation needs    Medical: Not on file    Non-medical: Not on file  Tobacco Use  . Smoking status: Never  Smoker  . Smokeless tobacco: Never Used  Substance and Sexual Activity  . Alcohol use: No  . Drug use: No  . Sexual activity: Not on file  Lifestyle  . Physical activity    Days per week: Not on file    Minutes per session: Not on file  . Stress: Not on file  Relationships  . Social Herbalist on phone: Not on file    Gets together: Not on file    Attends religious service: Not on file    Active member of club or organization: Not on file    Attends meetings of clubs or organizations: Not on file    Relationship status: Not on file  Other Topics Concern  . Not on file  Social History Narrative  . Not on file   Past Surgical History:  Procedure Laterality Date  . AMPUTATION Right 05/14/2018   Procedure: AMPUTATION BELOW KNEE;  Surgeon: Newt Minion, MD;  Location: [place];  Service: Orthopedics;  Laterality: Right;  . AMPUTATION Right 08/04/2018   Procedure: RIGHT ABOVE KNEE AMPUTATION;  Surgeon: Newt Minion, MD;  Location: [place];  Service: Orthopedics;  Laterality: Right;  . AMPUTATION Left 09/08/2018   Procedure: LEFT ABOVE KNEE AMPUTATION;  Surgeon: Newt Minion, MD;  Location: [place];  Service: Orthopedics;  Laterality: Left;  . APPLICATION OF WOUND  VAC Left 09/08/2018   Procedure: Application Of Wound Vac;  Surgeon: Newt Minion, MD;  Location: [place];  Service: Orthopedics;  Laterality: Left;  . ESOPHAGOGASTRODUODENOSCOPY (EGD) WITH PROPOFOL N/A 12/05/2017   Procedure: ESOPHAGOGASTRODUODENOSCOPY (EGD) WITH PROPOFOL;  Surgeon: Doran Stabler, MD;  [facility];  Service: Gastroenterology;  Laterality: N/A;  . HERNIA REPAIR    . IR AV DIALY SHUNT INTRO NEEDLE/INTRACATH INITIAL W/PTA/IMG LEFT  03/30/2017   Past Medical History:  Diagnosis Date  . Arthritis   . Congestive heart failure (CHF) ([place])   . Depression   . 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 Diabetic gastroparesis ([place]) 11/06/2017  . ESRD (end stage renal  disease) on dialysis [facility])    M,W.F dialysis  . H/O seasonal allergies   . Hypertension   . Pneumonia   . Sleep apnea    not using it now, on continuous O2 2 L during the day, 3 L during the night   BP (!) 164/90   Pulse 83   Ht 6' (1.829 m)   Wt 215 lb (97.5 kg)   SpO2 96% Comment: o2 via nasal can at 3ltrs  BMI 29.16 kg/m   Opioid Risk Score:   Fall Risk Score:  `1  Depression screen PHQ 2/9  Depression screen [facility] 2/9 04/01/2018 09/13/2017 07/26/2017 05/07/2017  Decreased Interest 0 1 0 0  Down, Depressed, Hopeless 0 2 0 2  PHQ - 2 Score 0 3 0 2  Altered sleeping - 3 - 3  Tired, decreased energy - 1 - 1  Change in appetite - 2 - 0  Feeling bad or failure about yourself  - 3 - 2  Trouble concentrating - 2 - 2  Moving slowly or fidgety/restless - 3 - 1  Suicidal thoughts - 2 - 2  PHQ-9 Score - 19 - 13  Difficult doing work/chores - - - Somewhat difficult     Review of Systems  Constitutional: Negative.   HENT: Negative.   Eyes: Negative.   Respiratory: Negative.   Cardiovascular: Negative.   Gastrointestinal: Negative.   Endocrine: Negative.   Genitourinary: Negative.   Musculoskeletal: Positive for arthralgias and myalgias.  Skin: Negative.   Allergic/Immunologic: Negative.   Neurological: Positive for numbness.  Hematological: Negative.   Psychiatric/Behavioral: Positive for dysphoric mood. The patient is nervous/anxious.   All other systems reviewed and are negative.      Objective:   Physical Exam Vitals signs and nursing note reviewed.  Constitutional:      Appearance: Normal appearance.  Neck:     Musculoskeletal: Normal range of motion and neck supple.  Cardiovascular:     Rate and Rhythm: Normal rate and regular rhythm.     Pulses: Normal pulses.     Heart sounds: Normal heart sounds.  Pulmonary:     Effort: Pulmonary effort is normal.     Breath sounds: Normal breath sounds.  Musculoskeletal:     Comments: Normal Muscle Bulk and Muscle  Testing Reveals:  Upper Extremities: Full ROM and Muscle Strength 5/5 Lower Extremities: Bilateral BKA'S Arrived in wheelchair   Skin:    General: Skin is warm and dry.     Comments: Right BKA: Area cleansed and dressing applied: Crusted edges noted Left BKA: Area Cleansed Staples and sutures OTA.   Neurological:     Mental Status: He is alert and oriented to person, place, and time.  Psychiatric:  Mood and Affect: Mood normal.        Behavior: Behavior normal.           Assessment & Plan:  1. Unilateral Left AKA/ S/P AKA Right: Continue outpatient therapy: Dr. Sharol Given Following.  2. Diabetic Peripheral Neuropathy: Continue to Monitor. PCP Following.  3. ESRD on Hemodialysis: Nephrology Following.   20 minutes of face to face patient care time was spent during this visit. All questions were encouraged and answered.  F/U in 4- 6 weeks with Dr. Posey Pronto

## 2018-10-10 NOTE — Progress Notes (Signed)
Post-Op Visit Note   Patient: Howard Bell           Date of Birth: June 14, 1965           MRN: [id number] Visit Date: 10/10/2018 PCP: Horald Pollen, MD  Chief Complaint:  Chief Complaint  Patient presents with  . Left Leg - Routine Post Op    09/08/2018 left AKA    HPI:  HPI The patient is a 53 year old gentleman who presents today in follow-up for left above-the-knee amputation on June 19.  He is status post right above-the-knee amputation back in February.  Complains of some soreness to the "" corners of his left stump.  Some scabbing to his right above-the-knee amputation.  He is following with [employer] for his prosthesis needs.  He does request an order for a trapeze bar. Ortho Exam On examination the right above-the-knee amputation is well consolidated there is an area of eschar laterally as well as medially the medial area was debrided with gauze Band-Aid applied laterally this is about 2 cm long 1 cm wide there is no surrounding erythema no drainage no sign of infection.  The left above-the-knee amputation with sutures and staples in place this is healing well there is some scattered dried blood along the incision.  No erythema no drainage no sign of infection to this.  Mild swelling.  Visit Diagnoses: No diagnosis found.  Plan: Continue daily Dial soap cleansing of both amputation sites.  Dry dressings to the left.  Iodosorb dressing changes to the right he will follow-up in the office in 3 weeks did provide an order for a trapeze as well as an order for both of his prosthetics.  Follow-Up Instructions: Return in about 3 weeks (around 10/31/2018).   Imaging: No results found.  Orders:  No orders of the defined types were placed in this encounter.  No orders of the defined types were placed in this encounter.    PMFS History: Patient Active Problem List   Diagnosis Date Noted  . Chronic hypercapnic respiratory failure ([place]) 10/02/2018  . Ulcer of finger  ([place])   . OSA (obstructive sleep apnea)   . S/P AKA (above knee amputation) unilateral, right ([place])   . Acute on chronic anemia   . Diabetic peripheral neuropathy ([place])   . Pressure injury of skin 09/16/2018  . Status post bilateral above knee amputation ([place]) 09/08/2018  . Acute respiratory failure with hypercapnia ([place])   . Atherosclerosis of native arteries of extremities with gangrene, left leg ([place]) 09/05/2018  . ESRD (end stage renal disease) ([place])   . Labile blood glucose   . Postoperative pain   . Dry gangrene (HCC)--left foot/left shin   . SIRS (systemic inflammatory response syndrome) (HCC)   . Poorly controlled type 2 diabetes mellitus with peripheral neuropathy ([place])   . Peripheral arterial disease ([place])   . Unilateral AKA, right ([place]) 08/17/2018  . Sepsis ([place]) 08/15/2018  . Acute metabolic encephalopathy 03/50/0938  . Gangrene of left foot ([place])   . Hypoglycemia   . Drug induced constipation   . Confusion, postoperative   . Labile blood pressure   . Uncontrolled diabetes mellitus type 2 with peripheral artery disease ([place])   . Anemia of chronic disease   . Diabetes mellitus type 2 in nonobese (HCC)   . Supplemental oxygen dependent   . Unilateral AKA, left ([place]) 08/07/2018  . S/P BKA (below knee amputation), right ([place]) 08/04/2018  . Severe protein-calorie malnutrition ([place]) 08/01/2018  . Dehiscence  of amputation stump ([place]) 08/01/2018  . Cutaneous abscess of left foot 08/01/2018  . Cellulitis of right lower extremity   . Influenza A 05/12/2018  . Pneumonia 05/12/2018  . Gangrene of right foot ([place])   . Critical limb ischemia with history of revascularization of same extremity 05/09/2018  . LUQ pain   . 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 ([place]) 11/16/2017  . 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 ([place]) 11/16/2017  . CKD stage 5 due to type 2 diabetes mellitus ([place]) 11/16/2017  .  Glaucoma due to type 2 diabetes mellitus ([place]) 11/16/2017  . Chronic generalized pain 11/16/2017  . Recurrent pneumonia 11/06/2017  . Diabetic gastroparesis ([place]) 11/06/2017  . Generalized abdominal pain 09/13/2017  . Acute pain of right shoulder 07/26/2017  . Acute bursitis of right shoulder 07/26/2017  . Left arm swelling 03/28/2017  . ESRD on dialysis ([place]) 03/28/2017  . Chest pain 11/26/2016  . Essential hypertension 11/26/2016  . Anemia due to end stage renal disease ([place]) 08/04/2016  . Acute respiratory failure with hypoxemia ([place]) 08/04/2016  . Type 2 diabetes mellitus with foot ulcer ([place]) 08/04/2016  . Acute CHF (congestive heart failure) ([place]) 08/04/2016  . Elevated troponin    Past Medical History:  Diagnosis Date  . Arthritis   . Congestive heart failure (CHF) ([place])   . Depression   . 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 Diabetic gastroparesis ([place]) 11/06/2017  . ESRD (end stage renal disease) on dialysis [facility])    M,W.F dialysis  . H/O seasonal allergies   . Hypertension   . Pneumonia   . Sleep apnea    not using it now, on continuous O2 2 L during the day, 3 L during the night    Family History  Problem Relation Age of Onset  . Diabetes Mother   . Hypertension Father     Past Surgical History:  Procedure Laterality Date  . AMPUTATION Right 05/14/2018   Procedure: AMPUTATION BELOW KNEE;  Surgeon: Newt Minion, MD;  Location: [place];  Service: Orthopedics;  Laterality: Right;  . AMPUTATION Right 08/04/2018   Procedure: RIGHT ABOVE KNEE AMPUTATION;  Surgeon: Newt Minion, MD;  Location: [place];  Service: Orthopedics;  Laterality: Right;  . AMPUTATION Left 09/08/2018   Procedure: LEFT ABOVE KNEE AMPUTATION;  Surgeon: Newt Minion, MD;  Location: [place];  Service: Orthopedics;  Laterality: Left;  . APPLICATION OF WOUND VAC Left 09/08/2018   Procedure: Application Of Wound Vac;  Surgeon: Newt Minion, MD;  Location: [place];   Service: Orthopedics;  Laterality: Left;  . ESOPHAGOGASTRODUODENOSCOPY (EGD) WITH PROPOFOL N/A 12/05/2017   Procedure: ESOPHAGOGASTRODUODENOSCOPY (EGD) WITH PROPOFOL;  Surgeon: Doran Stabler, MD;  [facility];  Service: Gastroenterology;  Laterality: N/A;  . HERNIA REPAIR    . IR AV DIALY SHUNT INTRO NEEDLE/INTRACATH INITIAL W/PTA/IMG LEFT  03/30/2017   Social History   Occupational History  . Not on file  Tobacco Use  . Smoking status: Never Smoker  . Smokeless tobacco: Never Used  Substance and Sexual Activity  . Alcohol use: No  . Drug use: No  . Sexual activity: Not on file

## 2018-10-11 DIAGNOSIS — N2581 Secondary hyperparathyroidism of renal origin: Secondary | ICD-10-CM | POA: Diagnosis not present

## 2018-10-11 DIAGNOSIS — D631 Anemia in chronic kidney disease: Secondary | ICD-10-CM | POA: Diagnosis not present

## 2018-10-11 DIAGNOSIS — N186 End stage renal disease: Secondary | ICD-10-CM | POA: Diagnosis not present

## 2018-10-12 ENCOUNTER — Telehealth: Payer: Self-pay | Admitting: *Deleted

## 2018-10-12 DIAGNOSIS — I509 Heart failure, unspecified: Secondary | ICD-10-CM | POA: Diagnosis not present

## 2018-10-12 DIAGNOSIS — I130 Hypertensive heart and chronic kidney disease with heart failure and stage 1 through stage 4 chronic kidney disease, or unspecified chronic kidney disease: Secondary | ICD-10-CM | POA: Diagnosis not present

## 2018-10-12 DIAGNOSIS — E1151 Type 2 diabetes mellitus with diabetic peripheral angiopathy without gangrene: Secondary | ICD-10-CM | POA: Diagnosis not present

## 2018-10-12 DIAGNOSIS — E1122 Type 2 diabetes mellitus with diabetic chronic kidney disease: Secondary | ICD-10-CM | POA: Diagnosis not present

## 2018-10-12 DIAGNOSIS — I872 Venous insufficiency (chronic) (peripheral): Secondary | ICD-10-CM | POA: Diagnosis not present

## 2018-10-12 DIAGNOSIS — Z4781 Encounter for orthopedic aftercare following surgical amputation: Secondary | ICD-10-CM | POA: Diagnosis not present

## 2018-10-12 NOTE — Telephone Encounter (Signed)
Faxed signed orders to ATTN: Gevena Cotton. Confirmation page received at 1:42 pm.

## 2018-10-13 DIAGNOSIS — N186 End stage renal disease: Secondary | ICD-10-CM | POA: Diagnosis not present

## 2018-10-13 DIAGNOSIS — N2581 Secondary hyperparathyroidism of renal origin: Secondary | ICD-10-CM | POA: Diagnosis not present

## 2018-10-13 DIAGNOSIS — D631 Anemia in chronic kidney disease: Secondary | ICD-10-CM | POA: Diagnosis not present

## 2018-10-16 DIAGNOSIS — N2581 Secondary hyperparathyroidism of renal origin: Secondary | ICD-10-CM | POA: Diagnosis not present

## 2018-10-16 DIAGNOSIS — N186 End stage renal disease: Secondary | ICD-10-CM | POA: Diagnosis not present

## 2018-10-16 DIAGNOSIS — D631 Anemia in chronic kidney disease: Secondary | ICD-10-CM | POA: Diagnosis not present

## 2018-10-17 DIAGNOSIS — E1151 Type 2 diabetes mellitus with diabetic peripheral angiopathy without gangrene: Secondary | ICD-10-CM | POA: Diagnosis not present

## 2018-10-17 DIAGNOSIS — Z4781 Encounter for orthopedic aftercare following surgical amputation: Secondary | ICD-10-CM | POA: Diagnosis not present

## 2018-10-17 DIAGNOSIS — I509 Heart failure, unspecified: Secondary | ICD-10-CM | POA: Diagnosis not present

## 2018-10-17 DIAGNOSIS — I872 Venous insufficiency (chronic) (peripheral): Secondary | ICD-10-CM | POA: Diagnosis not present

## 2018-10-17 DIAGNOSIS — I130 Hypertensive heart and chronic kidney disease with heart failure and stage 1 through stage 4 chronic kidney disease, or unspecified chronic kidney disease: Secondary | ICD-10-CM | POA: Diagnosis not present

## 2018-10-17 DIAGNOSIS — E1122 Type 2 diabetes mellitus with diabetic chronic kidney disease: Secondary | ICD-10-CM | POA: Diagnosis not present

## 2018-10-18 DIAGNOSIS — N186 End stage renal disease: Secondary | ICD-10-CM | POA: Diagnosis not present

## 2018-10-18 DIAGNOSIS — D631 Anemia in chronic kidney disease: Secondary | ICD-10-CM | POA: Diagnosis not present

## 2018-10-18 DIAGNOSIS — N2581 Secondary hyperparathyroidism of renal origin: Secondary | ICD-10-CM | POA: Diagnosis not present

## 2018-10-20 DIAGNOSIS — N186 End stage renal disease: Secondary | ICD-10-CM | POA: Diagnosis not present

## 2018-10-20 DIAGNOSIS — N2581 Secondary hyperparathyroidism of renal origin: Secondary | ICD-10-CM | POA: Diagnosis not present

## 2018-10-20 DIAGNOSIS — D631 Anemia in chronic kidney disease: Secondary | ICD-10-CM | POA: Diagnosis not present

## 2018-10-21 DIAGNOSIS — Z992 Dependence on renal dialysis: Secondary | ICD-10-CM | POA: Diagnosis not present

## 2018-10-21 DIAGNOSIS — E1129 Type 2 diabetes mellitus with other diabetic kidney complication: Secondary | ICD-10-CM | POA: Diagnosis not present

## 2018-10-21 DIAGNOSIS — N186 End stage renal disease: Secondary | ICD-10-CM | POA: Diagnosis not present

## 2018-10-23 DIAGNOSIS — N186 End stage renal disease: Secondary | ICD-10-CM | POA: Diagnosis not present

## 2018-10-23 DIAGNOSIS — D509 Iron deficiency anemia, unspecified: Secondary | ICD-10-CM | POA: Diagnosis not present

## 2018-10-23 DIAGNOSIS — Z992 Dependence on renal dialysis: Secondary | ICD-10-CM | POA: Diagnosis not present

## 2018-10-23 DIAGNOSIS — N2581 Secondary hyperparathyroidism of renal origin: Secondary | ICD-10-CM | POA: Diagnosis not present

## 2018-10-23 DIAGNOSIS — E1129 Type 2 diabetes mellitus with other diabetic kidney complication: Secondary | ICD-10-CM | POA: Diagnosis not present

## 2018-10-23 DIAGNOSIS — D631 Anemia in chronic kidney disease: Secondary | ICD-10-CM | POA: Diagnosis not present

## 2018-10-24 DIAGNOSIS — E1151 Type 2 diabetes mellitus with diabetic peripheral angiopathy without gangrene: Secondary | ICD-10-CM | POA: Diagnosis not present

## 2018-10-24 DIAGNOSIS — I872 Venous insufficiency (chronic) (peripheral): Secondary | ICD-10-CM | POA: Diagnosis not present

## 2018-10-24 DIAGNOSIS — Z4781 Encounter for orthopedic aftercare following surgical amputation: Secondary | ICD-10-CM | POA: Diagnosis not present

## 2018-10-24 DIAGNOSIS — E1122 Type 2 diabetes mellitus with diabetic chronic kidney disease: Secondary | ICD-10-CM | POA: Diagnosis not present

## 2018-10-24 DIAGNOSIS — I130 Hypertensive heart and chronic kidney disease with heart failure and stage 1 through stage 4 chronic kidney disease, or unspecified chronic kidney disease: Secondary | ICD-10-CM | POA: Diagnosis not present

## 2018-10-24 DIAGNOSIS — I509 Heart failure, unspecified: Secondary | ICD-10-CM | POA: Diagnosis not present

## 2018-10-25 DIAGNOSIS — N186 End stage renal disease: Secondary | ICD-10-CM | POA: Diagnosis not present

## 2018-10-25 DIAGNOSIS — E1129 Type 2 diabetes mellitus with other diabetic kidney complication: Secondary | ICD-10-CM | POA: Diagnosis not present

## 2018-10-25 DIAGNOSIS — D509 Iron deficiency anemia, unspecified: Secondary | ICD-10-CM | POA: Diagnosis not present

## 2018-10-25 DIAGNOSIS — N2581 Secondary hyperparathyroidism of renal origin: Secondary | ICD-10-CM | POA: Diagnosis not present

## 2018-10-25 DIAGNOSIS — D631 Anemia in chronic kidney disease: Secondary | ICD-10-CM | POA: Diagnosis not present

## 2018-10-25 DIAGNOSIS — Z992 Dependence on renal dialysis: Secondary | ICD-10-CM | POA: Diagnosis not present

## 2018-10-27 DIAGNOSIS — D509 Iron deficiency anemia, unspecified: Secondary | ICD-10-CM | POA: Diagnosis not present

## 2018-10-27 DIAGNOSIS — E1129 Type 2 diabetes mellitus with other diabetic kidney complication: Secondary | ICD-10-CM | POA: Diagnosis not present

## 2018-10-27 DIAGNOSIS — D631 Anemia in chronic kidney disease: Secondary | ICD-10-CM | POA: Diagnosis not present

## 2018-10-27 DIAGNOSIS — Z992 Dependence on renal dialysis: Secondary | ICD-10-CM | POA: Diagnosis not present

## 2018-10-27 DIAGNOSIS — N2581 Secondary hyperparathyroidism of renal origin: Secondary | ICD-10-CM | POA: Diagnosis not present

## 2018-10-27 DIAGNOSIS — N186 End stage renal disease: Secondary | ICD-10-CM | POA: Diagnosis not present

## 2018-10-30 DIAGNOSIS — D631 Anemia in chronic kidney disease: Secondary | ICD-10-CM | POA: Diagnosis not present

## 2018-10-30 DIAGNOSIS — Z992 Dependence on renal dialysis: Secondary | ICD-10-CM | POA: Diagnosis not present

## 2018-10-30 DIAGNOSIS — E1129 Type 2 diabetes mellitus with other diabetic kidney complication: Secondary | ICD-10-CM | POA: Diagnosis not present

## 2018-10-30 DIAGNOSIS — N2581 Secondary hyperparathyroidism of renal origin: Secondary | ICD-10-CM | POA: Diagnosis not present

## 2018-10-30 DIAGNOSIS — N186 End stage renal disease: Secondary | ICD-10-CM | POA: Diagnosis not present

## 2018-10-30 DIAGNOSIS — D509 Iron deficiency anemia, unspecified: Secondary | ICD-10-CM | POA: Diagnosis not present

## 2018-10-31 ENCOUNTER — Encounter: Payer: Self-pay | Admitting: Emergency Medicine

## 2018-10-31 ENCOUNTER — Other Ambulatory Visit: Payer: Self-pay

## 2018-10-31 ENCOUNTER — Ambulatory Visit (INDEPENDENT_AMBULATORY_CARE_PROVIDER_SITE_OTHER): Payer: Medicare Other | Admitting: Emergency Medicine

## 2018-10-31 VITALS — BP 145/83 | HR 57 | Temp 98.9°F | Resp 16

## 2018-10-31 DIAGNOSIS — I509 Heart failure, unspecified: Secondary | ICD-10-CM | POA: Diagnosis not present

## 2018-10-31 DIAGNOSIS — G4733 Obstructive sleep apnea (adult) (pediatric): Secondary | ICD-10-CM | POA: Diagnosis not present

## 2018-10-31 DIAGNOSIS — E1151 Type 2 diabetes mellitus with diabetic peripheral angiopathy without gangrene: Secondary | ICD-10-CM | POA: Diagnosis not present

## 2018-10-31 DIAGNOSIS — E118 Type 2 diabetes mellitus with unspecified complications: Secondary | ICD-10-CM

## 2018-10-31 DIAGNOSIS — Z4781 Encounter for orthopedic aftercare following surgical amputation: Secondary | ICD-10-CM | POA: Diagnosis not present

## 2018-10-31 DIAGNOSIS — I110 Hypertensive heart disease with heart failure: Secondary | ICD-10-CM

## 2018-10-31 DIAGNOSIS — I739 Peripheral vascular disease, unspecified: Secondary | ICD-10-CM

## 2018-10-31 DIAGNOSIS — I5023 Acute on chronic systolic (congestive) heart failure: Secondary | ICD-10-CM | POA: Diagnosis not present

## 2018-10-31 DIAGNOSIS — I872 Venous insufficiency (chronic) (peripheral): Secondary | ICD-10-CM | POA: Diagnosis not present

## 2018-10-31 DIAGNOSIS — E1143 Type 2 diabetes mellitus with diabetic autonomic (poly)neuropathy: Secondary | ICD-10-CM

## 2018-10-31 DIAGNOSIS — N186 End stage renal disease: Secondary | ICD-10-CM

## 2018-10-31 DIAGNOSIS — I70262 Atherosclerosis of native arteries of extremities with gangrene, left leg: Secondary | ICD-10-CM

## 2018-10-31 DIAGNOSIS — K3184 Gastroparesis: Secondary | ICD-10-CM | POA: Diagnosis not present

## 2018-10-31 DIAGNOSIS — E1122 Type 2 diabetes mellitus with diabetic chronic kidney disease: Secondary | ICD-10-CM | POA: Diagnosis not present

## 2018-10-31 DIAGNOSIS — J9612 Chronic respiratory failure with hypercapnia: Secondary | ICD-10-CM

## 2018-10-31 DIAGNOSIS — I130 Hypertensive heart and chronic kidney disease with heart failure and stage 1 through stage 4 chronic kidney disease, or unspecified chronic kidney disease: Secondary | ICD-10-CM | POA: Diagnosis not present

## 2018-10-31 DIAGNOSIS — Z992 Dependence on renal dialysis: Secondary | ICD-10-CM | POA: Diagnosis not present

## 2018-10-31 MED ORDER — GLUCOSE BLOOD VI STRP: 1.0000 | ORAL_STRIP | 11 refills | Status: AC | PRN

## 2018-10-31 MED ORDER — OMEPRAZOLE 20 MG PO CPDR: 20.0000 mg | DELAYED_RELEASE_CAPSULE | Freq: Every day | ORAL | 3 refills | Status: DC

## 2018-10-31 MED ORDER — METOPROLOL SUCCINATE ER 50 MG PO TB24: 50.0000 mg | ORAL_TABLET | Freq: Every day | ORAL | 3 refills | Status: DC

## 2018-10-31 MED ORDER — ACCU-CHEK SOFT TOUCH LANCETS MISC: 12 refills | Status: AC

## 2018-10-31 NOTE — Progress Notes (Signed)
Expand widget buttonCollapse widget button                   customization button                                                                                                      Physician Discharge Summary    Patient ID:  Howard Bell  MRN: [id number]  DOB/AGE: June 12, 1965 53 y.o.     Admit date: 09/16/2018  Discharge date: 09/29/2018     Discharge Diagnoses:   Principal Problem:    Unilateral AKA, left (HCC)  Active Problems:    ESRD on dialysis ([place])    S/P AKA (above knee amputation) unilateral, right (HCC)    Acute on chronic anemia    Diabetic peripheral neuropathy (HCC)    OSA (obstructive sleep apnea)    Ulcer of finger (HCC)    Chronic hypercapnic respiratory failure (HCC)       Discharged Condition:   Stable      Significant Diagnostic Studies: N/A        Labs:   Basic Metabolic Panel:        Recent Labs    Lab   09/25/18  1400   09/27/18  1500   09/29/18  0828    NA   131*   130*   132*    K   4.5   4.4   4.2    CL   93*   92*   94*    CO2   24   24   25     GLUCOSE   115*   103*   99    BUN   64*   65*   63*    CREATININE   11.56*   10.52*   9.88*    CALCIUM   9.1   9.3   9.2    PHOS   4.7*   5.2*   4.9*          CBC:        Recent Labs    Lab   09/25/18  1359   09/27/18  1500   09/29/18  0829    WBC   7.0   7.6   6.3    HGB   7.3*   8.4*   8.5*    HCT   24.3*   26.0*   27.3*    MCV   98.8   99.2   99.3    PLT   233   207   208          CBG:  Last Labs                                                                        Brief HPI:   Howard  Bell is a 53 year old male with history of T2DM with  neuropathy and retinopathy, ESRD-hemodialysis MWF, OSA with chronic hypoxic respiratory failure, PVD with gangrenous changes BLE s/p right AKA with recent CIR stay.  He was admitted on 09/08/2018 for left AKA due to progressive gangrenous changes.  Postop had issues with acute on chronic respiratory failure as well as hypotension requiring pressors.  He was extubated to BiPAP on 6/20 and mentation is slowly improving.  Acute on chronic anemia has been monitored as patient is symptomatic.  Therapy evaluations completed revealing decline in functional status and CIR was recommended for follow-up therapy        Hospital Course: Howard Bell was admitted to rehab 09/16/2018 for inpatient therapies to consist of PT, ST and OT at least three hours five days a week. Past admission physiatrist, therapy team and rehab RN have worked together to provide customized collaborative inpatient rehab.  His respiratory status has been stable and he has been compliant with BiPAP use at night.  He continues on supplemental oxygen during the day.  Wound VAC was DC'd on 6/30 and left AKA site is clean dry and intact.  Diabetes has been monitored with ac/hs CBG checks and blood sugars have been labile.  Sliding scale insulin use for elevated blood sugars due to tendency to drop drastically with tight blood sugar control.     Hemodialysis has been ongoing on MWF at the end of the day to help with tolerance of therapy.  Acute on chronic anemia was treated with 1 unit packed red blood cells on 07/06 as well as Aranesp IV.  Calcium acetate was DC'd due to elevated calcium and he was started anorexia tid.  Blood pressures have been monitored on a yearly basis and has been stable.  Protein supplements ordered due to protein calorie malnutrition.  Right fifth digit ulcer is clean and dry however patient has been refusing dressing change changes for care.  He has progressed to supervision level and will will continue to receive  follow-up home health PT, OT, speech therapy, and RN by [facility] at home after discharge.        Rehab course: During patient's stay in rehab weekly team conferences were held to monitor patient's progress, set goals and discuss barriers to discharge. At admission, patient required max assist with ADL task and mod assist with mobility.  He  has had improvement in activity tolerance, balance, postural control as well as ability to compensate for deficits.  He is able to complete bathing and dressing at bed level with supervision.  He requires max assist for clothing management post toileting. He he requires mod assist with bed mobility and is able to complete transfers with use of sliding board and supervision.     Disposition: Home      Diet: Renal/Carb modified.      Special Instructions:  1. Monitor BS ac/hs and continue SSI.  2. Needs to wear BIPAP when napping and at bedtime.  3. Oxygen 2 L during the day and 3 L bled in at nights.         Allergies as of 09/29/2018     No Known Allergies                 Medication List          STOP taking these medications      calcium acetate 667 MG capsule  Commonly known as: PHOSLO       methocarbamol 500 MG  tablet  Commonly known as: ROBAXIN       sevelamer carbonate 800 MG tablet  Commonly known as: RENVELA       silver sulfADIAZINE 1 % cream  Commonly known as: SILVADENE              TAKE these medications      acetaminophen 325 MG tablet  Commonly known as: TYLENOL  Take 1-2 tablets (325-650 mg total) by mouth every 4 (four) hours as needed for mild pain.       Admelog SoloStar 100 UNIT/ML KwikPen  Generic drug: insulin lispro  Inject 1-9 Units into the skin 3 (three) times daily. If bs is 121-150=1 unit, 151-200=2 units, 201-250=3 units, 251-300=5 units, 301-350=7 units, 351-400=9 units       albuterol 108 (90 Base) MCG/ACT inhaler  Commonly known as: VENTOLIN  HFA  Inhale 1 puff into the lungs every 4 (four) hours as needed for wheezing or shortness of breath.       amLODipine 10 MG tablet  Commonly known as: NORVASC  Take 1 tablet (10 mg total) by mouth daily.       brimonidine 0.2 % ophthalmic solution  Commonly known as: ALPHAGAN  Place 1 drop into both eyes 2 (two) times daily.       docusate sodium 100 MG capsule  Commonly known as: COLACE  Take 1 capsule (100 mg total) by mouth 2 (two) times daily.       ferric citrate 1 GM 210 MG(Fe) tablet  Commonly known as: AURYXIA  Take 1 tablet (210 mg total) by mouth 3 (three) times daily with meals.       fluticasone 50 MCG/ACT nasal spray  Commonly known as: FLONASE  Place 1 spray into both nostrils daily.       gabapentin 100 MG capsule  Commonly known as: NEURONTIN  Take 1 capsule (100 mg total) by mouth at bedtime.       latanoprost 0.005 % ophthalmic solution  Commonly known as: XALATAN  Place 1 drop into both eyes at bedtime.       metoCLOPramide 5 MG tablet  Commonly known as: Reglan  Take 1 tablet (5 mg total) by mouth 3 (three) times daily before meals.       metoprolol succinate 50 MG 24 hr tablet  Commonly known as: TOPROL-XL  Take 1 tablet (50 mg total) by mouth at bedtime. Take with a meal.       multivitamin Tabs tablet  Take 1 tablet by mouth at bedtime.       omeprazole 20 MG capsule  Commonly known as: PRILOSEC  Take 1 capsule (20 mg total) by mouth at bedtime.       oxyCODONE 5 MG immediate release tablet--Rx # 30 pills  Commonly known as: Oxy IR/ROXICODONE  Take 1-2 tablets (5-10 mg total) by mouth every 8 (eight) hours as needed for severe pain.       timolol 0.5 % ophthalmic solution  Commonly known as: BETIMOL  Place 1 drop into both eyes 2 (two) times daily.       traMADol 50 MG tablet--Rx # 28 pills  Commonly known as: ULTRAM  Take 1 tablet (50 mg total) by mouth every 12  (twelve) hours as needed for moderate pain.                    Follow-up Information            Newt Minion, MD  Follow up.     Specialty: Orthopedic Surgery  Contact information:  [place] [address]  [phone]                         Horald Pollen, MD Follow up in 2 week(s).     Specialty: Internal Medicine  Why: for hospital follow up visit  Contact information:  [place] [address]  [id number]                         Jamse Arn, MD Follow up.     Specialty: Physical Medicine and Rehabilitation  Why: Office will call you with follow up appointment  Contact information:  [place]  [place] [address]  [phone]                             Signed:  Bary Leriche  10/02/2018, 9:46 AM  Patient seen and examined by me on day of discharge.  Delice Lesch, MD, ABPMR  Howard Bell 52 y.o.   Chief Complaint  Patient presents with   Post-op Problem    no problem, patient is following up after [place]  09/08/2018   Medication Refill    Metoprolol succinate and Prilosec, glucose lancet, test strips    HISTORY OF PRESENT ILLNESS: This is a 53 y.o. male with multiple medical problems here for hospital follow-up.  Status post right AKA.  Patient has a history of diabetes with severe peripheral vascular disease, end-stage renal disease, hypertension and hypertensive heart disease, last dialysis yesterday.  Also has a history of COPD oxygen dependent.  Reports no new medical problems.  No fever or chills.  No flulike symptoms.  Recovering without complication so far.  HPI   Prior to Admission medications   Medication Sig Start Date End Date Taking? Authorizing Provider  albuterol (PROVENTIL HFA;VENTOLIN HFA) 108 (90 Base) MCG/ACT inhaler Inhale 1 puff into the lungs  every 4 (four) hours as needed for wheezing or shortness of breath.    Yes [provider]  amLODipine (NORVASC) 10 MG tablet Take 1 tablet (10 mg total) by mouth daily. 09/30/18  Yes Love, Ivan Anchors, PA-C  brimonidine (ALPHAGAN) 0.2 % ophthalmic solution Place 1 drop into both eyes 2 (two) times daily.   Yes [provider]  docusate sodium (COLACE) 100 MG capsule Take 1 capsule (100 mg total) by mouth 2 (two) times daily. 09/29/18  Yes Love, Ivan Anchors, PA-C  ferric citrate (AURYXIA) 1 GM 210 MG(Fe) tablet Take 1 tablet (210 mg total) by mouth 3 (three) times daily with meals. 09/29/18  Yes Love, Ivan Anchors, PA-C  fluticasone (FLONASE) 50 MCG/ACT nasal spray Place 1 spray into both nostrils daily.   Yes [provider]  glucose blood test strip 1 each by Other route as needed for other. Use as instructed 10/31/18  Yes Molley Houser, Ines Bloomer, MD  insulin lispro (ADMELOG SOLOSTAR) 100 UNIT/ML KwikPen Inject 1-9 Units into the skin 3 (three) times daily. If bs is 121-150=1 unit, 151-200=2 units, 201-250=3 units, 251-300=5 units, 301-350=7 units, 351-400=9 units   Yes [provider]  latanoprost (XALATAN) 0.005 % ophthalmic solution Place 1 drop into both eyes at bedtime.   Yes [provider]  metoCLOPramide (REGLAN) 5 MG tablet Take 1 tablet (  5 mg total) by mouth 3 (three) times daily before meals. 08/30/18  Yes Love, Ivan Anchors, PA-C  oxyCODONE (OXY IR/ROXICODONE) 5 MG immediate release tablet Take 1-2 tablets (5-10 mg total) by mouth every 8 (eight) hours as needed for severe pain. 09/29/18  Yes Love, Ivan Anchors, PA-C  traMADol (ULTRAM) 50 MG tablet Take 1 tablet (50 mg total) by mouth every 12 (twelve) hours as needed for moderate pain. 09/29/18  Yes Love, Ivan Anchors, PA-C  acetaminophen (TYLENOL) 325 MG tablet Take 1-2 tablets (325-650 mg total) by mouth every 4 (four) hours as needed for mild pain. Patient not taking: Reported on 10/31/2018 09/26/18   Love, Ivan Anchors, PA-C    gabapentin (NEURONTIN) 100 MG capsule Take 1 capsule (100 mg total) by mouth at bedtime. Patient not taking: Reported on 10/31/2018 09/29/18   Love, Ivan Anchors, PA-C  Lancets (ACCU-CHEK SOFT TOUCH) lancets Use as instructed 10/31/18   Horald Pollen, MD  metoprolol succinate (TOPROL-XL) 50 MG 24 hr tablet Take 1 tablet (50 mg total) by mouth at bedtime. Take with a meal. 10/31/18 01/29/19  Horald Pollen, MD  multivitamin (RENA-VIT) TABS tablet Take 1 tablet by mouth at bedtime. Patient not taking: Reported on 10/31/2018 09/29/18   Love, Ivan Anchors, PA-C  omeprazole (PRILOSEC) 20 MG capsule Take 1 capsule (20 mg total) by mouth at bedtime. 10/31/18   Horald Pollen, MD  timolol (BETIMOL) 0.5 % ophthalmic solution Place 1 drop into both eyes 2 (two) times daily. Patient not taking: Reported on 10/31/2018 05/07/17   Joretta Bachelor, PA    Not on File  Patient Active Problem List   Diagnosis Date Noted   Chronic hypercapnic respiratory failure ([place]) 10/02/2018   OSA (obstructive sleep apnea)    S/P AKA (above knee amputation) unilateral, right ([place])    Diabetic peripheral neuropathy ([place])    Status post bilateral above knee amputation ([place]) 09/08/2018   Atherosclerosis of native arteries of extremities with gangrene, left leg ([place]) 09/05/2018   Poorly controlled type 2 diabetes mellitus with peripheral neuropathy (HCC)    Peripheral arterial disease (HCC)    Unilateral AKA, right ([place]) 08/17/2018   Drug induced constipation    Uncontrolled diabetes mellitus type 2 with peripheral artery disease (HCC)    Anemia of chronic disease    Diabetes mellitus type 2 in nonobese [facility])    Supplemental oxygen dependent    Unilateral AKA, left ([place]) 08/07/2018   S/P BKA (below knee amputation), right ([place]) 08/04/2018   Critical limb ischemia with history of revascularization of same extremity 05/09/2018   Benign hypertensive heart and kidney disease with HF and CKD  stage V ([place]) 11/16/2017   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 ([place]) 11/16/2017   CKD stage 5 due to type 2 diabetes mellitus ([place]) 11/16/2017   Glaucoma due to type 2 diabetes mellitus ([place]) 11/16/2017   Chronic generalized pain 11/16/2017   Recurrent pneumonia 11/06/2017   Diabetic gastroparesis ([place]) 11/06/2017   Generalized abdominal pain 09/13/2017   ESRD on dialysis ([place]) 03/28/2017   Anemia due to end stage renal disease ([place]) 08/04/2016    Past Medical History:  Diagnosis Date   Arthritis    Congestive heart failure (CHF) ([place])    Depression    Diabetes mellitus without complication ([place])    insulin dependent   Diabetic gastroparesis ([place]) 11/06/2017   ESRD (end stage renal disease) on dialysis [facility])    M,W.F dialysis  H/O seasonal allergies    Hypertension    Pneumonia    Sleep apnea    not using it now, on continuous O2 2 L during the day, 3 L during the night    Past Surgical History:  Procedure Laterality Date   AMPUTATION Right 05/14/2018   Procedure: AMPUTATION BELOW KNEE;  Surgeon: Newt Minion, MD;  Location: [facility];  Service: Orthopedics;  Laterality: Right;   AMPUTATION Right 08/04/2018   Procedure: RIGHT ABOVE KNEE AMPUTATION;  Surgeon: Newt Minion, MD;  Location: [facility];  Service: Orthopedics;  Laterality: Right;   AMPUTATION Left 09/08/2018   Procedure: LEFT ABOVE KNEE AMPUTATION;  Surgeon: Newt Minion, MD;  Location: [facility] [place];  Service: Orthopedics;  Laterality: Left;   APPLICATION OF WOUND VAC Left 09/08/2018   Procedure: Application Of Wound Vac;  Surgeon: Newt Minion, MD;  Location: [facility];  Service: Orthopedics;  Laterality: Left;   ESOPHAGOGASTRODUODENOSCOPY (EGD) WITH PROPOFOL N/A 12/05/2017   Procedure: ESOPHAGOGASTRODUODENOSCOPY (EGD) WITH PROPOFOL;  Surgeon: Doran Stabler, MD;  Location: [facility];  Service: Gastroenterology;  Laterality: N/A;   HERNIA REPAIR      IR AV DIALY SHUNT INTRO NEEDLE/INTRACATH INITIAL W/PTA/IMG LEFT  03/30/2017    Social History   Socioeconomic History   Marital status: Married    Spouse name: Not on file   Number of children: Not on file   Years of education: Not on file   Highest education level: Not on file  Occupational History   Not on file  Social Needs   Financial resource strain: Not on file   Food insecurity    Worry: Not on file    Inability: Not on file   Transportation needs    Medical: Not on file    Non-medical: Not on file  Tobacco Use   Smoking status: Never Smoker   Smokeless tobacco: Never Used  Substance and Sexual Activity   Alcohol use: No   Drug use: No   Sexual activity: Not on file  Lifestyle   Physical activity    Days per week: Not on file    Minutes per session: Not on file   Stress: Not on file  Relationships   Social connections    Talks on phone: Not on file    Gets together: Not on file    Attends religious service: Not on file    Active member of club or organization: Not on file    Attends meetings of clubs or organizations: Not on file    Relationship status: Not on file   Intimate partner violence    Fear of current or ex partner: Not on file    Emotionally abused: Not on file    Physically abused: Not on file    Forced sexual activity: Not on file  Other Topics Concern   Not on file  Social History Narrative   Not on file    Family History  Problem Relation Age of Onset   Diabetes Mother    Hypertension Father      Review of Systems  Constitutional: Negative for chills and fever.  HENT: Negative for congestion and sore throat.   Respiratory: Negative.  Negative for cough and shortness of breath.   Cardiovascular: Negative.  Negative for chest pain and palpitations.  Gastrointestinal: Negative for abdominal pain, blood in stool, nausea and vomiting.       Has daily bowel movement without problems.  Skin: Negative.  Negative for  rash.  Neurological: Negative for dizziness and headaches.  All other systems reviewed and are negative.  Today's Vitals   10/31/18 1026  BP: (!) 145/83  Pulse: (!) 57  Resp: 16  Temp: 98.9 F (37.2 C)  TempSrc: Oral  SpO2: 94%   There is no height or weight on file to calculate BMI.   Physical Exam Vitals signs reviewed.  HENT:     Head: Normocephalic.  Eyes:     Extraocular Movements: Extraocular movements intact.     Pupils: Pupils are equal, round, and reactive to light.  Neck:     Musculoskeletal: Normal range of motion and neck supple.  Cardiovascular:     Rate and Rhythm: Regular rhythm. Bradycardia present.     Heart sounds: Normal heart sounds.  Pulmonary:     Effort: Pulmonary effort is normal.     Breath sounds: Normal breath sounds.  Abdominal:     Palpations: Abdomen is soft.     Tenderness: There is no abdominal tenderness.  Musculoskeletal:     Comments: Bilateral AKA  Skin:    General: Skin is warm and dry.  Neurological:     General: No focal deficit present.     Mental Status: He is alert and oriented to person, place, and time.  Psychiatric:        Mood and Affect: Mood normal.        Behavior: Behavior normal.      ASSESSMENT & PLAN: Howard Bell was seen today for post-op problem and medication refill.  Diagnoses and all orders for this visit:  Diabetes mellitus with complication ([place]) -     glucose blood test strip; 1 each by Other route as needed for other. Use as instructed -     Lancets (ACCU-CHEK SOFT TOUCH) lancets; Use as instructed  ESRD on dialysis [facility])  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 (HCC) -     metoprolol succinate (TOPROL-XL) 50 MG 24 hr tablet; Take 1 tablet (50 mg total) by mouth at bedtime. Take with a meal.  Peripheral arterial disease (HCC)  Chronic hypercapnic respiratory failure (HCC)  OSA (obstructive sleep apnea)  Diabetic gastroparesis (HCC) -     omeprazole  (PRILOSEC) 20 MG capsule; Take 1 capsule (20 mg total) by mouth at bedtime.  Clinically stable.  Appears to be recovering well.  No complications so far.  Patient Instructions       If you have lab work done today you will be contacted with your lab results within the next 2 weeks.  If you have not heard from [place] then please contact us. The fastest way to get your results is to register for My Chart.   IF you received an x-ray today, you will receive an invoice from [facility]. Please contact [facility] at [phone] with questions or concerns regarding your invoice.   IF you received labwork today, you will receive an invoice from [facility]. Please contact [facility] at [phone] with questions or concerns regarding your invoice.   Our billing staff will not be able to assist you with questions regarding bills from these companies.  You will be contacted with the lab results as soon as they are available. The fastest way to get your results is to activate your My Chart account. Instructions are located on the last page of this paperwork. If you have not heard from [place] regarding the results in 2 weeks, please contact this office.     Eating  Plan for Dialysis Dialysis is a treatment that cleans your blood. It is used when your kidneys are damaged. When you need dialysis, you should watch what you eat. This is because some nutrients can build up in your blood between treatments and make you sick. Your doctor or diet specialist (dietitian) will:  Tell you what nutrients you should include or avoid.  Tell you how much of these nutrients you should get each day.  Help you plan meals.  Tell you how much to drink each day. What are tips for following this plan? Reading food labels  Check food labels for: ? Potassium. This is found in milk, fruits, and vegetables. ? Phosphorus. This is found in milk, cheese, beans, nuts, and carbonated beverages. ? Salt (sodium).  This is in processed meats, cured meats, ready-made frozen meals, canned vegetables, and salty snack foods.  Try to find foods that are low in potassium, phosphorus, and sodium.  Look for foods that are labeled "sodium free," "reduced sodium," or "low sodium." Shopping  Do not buy whole-grain and high-fiber foods.  Do not buy or use salt substitutes.  Do not buy processed foods. Cooking  Drain all fluid from cooked vegetables and canned fruits before you eat them.  Before you cook potatoes, cut them into small pieces. Then boil them in unsalted water.  Try using herbs and spices that do not contain sodium to add flavor. Meal planning Most people on dialysis should try to eat:  6-11 servings of grains each day. One serving is equal to 1 slice of bread or  cup of cooked rice or pasta.  2-3 servings of low-potassium vegetables each day. One serving is equal to  cup.  2-3 servings of low-potassium fruits each day. One serving is equal to  cup.  Protein, such as meat, poultry, fish, and eggs. Talk with your doctor or dietitian about the right amount and type of protein to eat.   cup of dairy each day. General information  Follow your doctor's instructions about how much to drink. You may be told to: ? Write down what you drink. ? Write down the foods you eat that are made mostly from water, such as gelatin and soups. ? Drink from small cups.  Take vitamin and mineral supplements only as told by your doctor.  Take over-the-counter and prescription medicines only as told by your doctor. What foods can I eat?     Fruits Apples. Fresh or frozen berries. Fresh or canned pears, peaches, and pineapple. Grapes. Plums. Vegetables Fresh or frozen broccoli, carrots, and green beans. Cabbage. Cauliflower. Celery. Cucumbers. Eggplant. Radishes. Zucchini. Grains White bread. White rice. Cooked cereal. Unsalted popcorn. Tortillas. Pasta. Meats and other proteins Fresh or frozen  beef, pork, chicken, and fish. Eggs. Dairy Cream cheese. Heavy cream. Ricotta cheese. Beverages Apple cider. Cranberry juice. Grape juice. Lemonade. Black coffee. Rice milk (that is not enriched or fortified). Seasonings and condiments Herbs. Spices. Jam and jelly. Honey. Sweets and desserts Sherbet. Cakes. Cookies. Fats and oils Olive oil, canola oil, and safflower oil. Other foods Non-dairy creamer. Non-dairy whipped topping. Homemade broth without salt. The items listed above may not be a complete list of foods and beverages you can eat. Contact your dietitian for more options. What foods should I avoid? Fruits Star fruit. Bananas. Oranges. Kiwi. Nectarines. Prunes. Melon. Dried fruit. Avocado. Vegetables Potatoes. Beets. Tomatoes. Winter squash and pumpkin. Asparagus. Spinach. Parsnips. Grains Whole-grain bread. Whole-grain pasta. High-fiber cereal. Meats and other proteins Canned, smoked, and  cured meats. Packaged lunch meat. Sardines. Nuts and seeds. Peanut butter. Beans and legumes. Dairy Milk. Buttermilk. Yogurt. Cheese and cottage cheese. Processed cheese spreads. Beverages Orange juice. Prune juice. Carbonated soft drinks. Seasonings and condiments Salt. Salt substitutes. Soy sauce. Sweets and desserts Ice cream. Chocolate. Candied nuts. Fats and oils Butter. Margarine. Other foods Ready-made frozen meals. Canned soups. The items listed above may not be a complete list of foods and beverages you should avoid. Contact your dietitian for more information. Summary  If you are having dialysis, it is important to watch what you eat. Certain nutrients and wastes can build up in your blood and cause you to get sick.  Your dietitian will help you make an eating plan that meets your needs.  Avoid foods that are high in potassium, salt (sodium), and phosphorus. Restrict fluids as told by your doctor or dietitian. This information is not intended to replace advice given to  you by your health care provider. Make sure you discuss any questions you have with your health care provider. Document Released: 09/07/2011 Document Revised: 05/25/2017 Document Reviewed: 03/09/2017 Elsevier Patient Education  [place].  Diabetes Mellitus and Nutrition, Adult When you have diabetes (diabetes mellitus), it is very important to have healthy eating habits because your blood sugar (glucose) levels are greatly affected by what you eat and drink. Eating healthy foods in the appropriate amounts, at about the same times every day, can help you:  Control your blood glucose.  Lower your risk of heart disease.  Improve your blood pressure.  Reach or maintain a healthy weight. Every person with diabetes is different, and each person has different needs for a meal plan. Your health care provider may recommend that you work with a diet and nutrition specialist (dietitian) to make a meal plan that is best for you. Your meal plan may vary depending on factors such as:  The calories you need.  The medicines you take.  Your weight.  Your blood glucose, blood pressure, and cholesterol levels.  Your activity level.  Other health conditions you have, such as heart or kidney disease. How do carbohydrates affect me? Carbohydrates, also called carbs, affect your blood glucose level more than any other type of food. Eating carbs naturally raises the amount of glucose in your blood. Carb counting is a method for keeping track of how many carbs you eat. Counting carbs is important to keep your blood glucose at a healthy level, especially if you use insulin or take certain oral diabetes medicines. It is important to know how many carbs you can safely have in each meal. This is different for every person. Your dietitian can help you calculate how many carbs you should have at each meal and for each snack. Foods that contain carbs include:  Bread, cereal, rice, pasta, and  crackers.  Potatoes and corn.  Peas, beans, and lentils.  Milk and yogurt.  Fruit and juice.  Desserts, such as cakes, cookies, ice cream, and candy. How does alcohol affect me? Alcohol can cause a sudden decrease in blood glucose (hypoglycemia), especially if you use insulin or take certain oral diabetes medicines. Hypoglycemia can be a life-threatening condition. Symptoms of hypoglycemia (sleepiness, dizziness, and confusion) are similar to symptoms of having too much alcohol. If your health care provider says that alcohol is safe for you, follow these guidelines:  Limit alcohol intake to no more than 1 drink per day for nonpregnant women and 2 drinks per day for men. One drink  equals 12 oz of beer, 5 oz of wine, or 1 oz of hard liquor.  Do not drink on an empty stomach.  Keep yourself hydrated with water, diet soda, or unsweetened iced tea.  Keep in mind that regular soda, juice, and other mixers may contain a lot of sugar and must be counted as carbs. What are tips for following this plan?  Reading food labels  Start by checking the serving size on the "Nutrition Facts" label of packaged foods and drinks. The amount of calories, carbs, fats, and other nutrients listed on the label is based on one serving of the item. Many items contain more than one serving per package.  Check the total grams (g) of carbs in one serving. You can calculate the number of servings of carbs in one serving by dividing the total carbs by 15. For example, if a food has 30 g of total carbs, it would be equal to 2 servings of carbs.  Check the number of grams (g) of saturated and trans fats in one serving. Choose foods that have low or no amount of these fats.  Check the number of milligrams (mg) of salt (sodium) in one serving. Most people should limit total sodium intake to less than 2,300 mg per day.  Always check the nutrition information of foods labeled as "low-fat" or "nonfat". These foods may be  higher in added sugar or refined carbs and should be avoided.  Talk to your dietitian to identify your daily goals for nutrients listed on the label. Shopping  Avoid buying canned, premade, or processed foods. These foods tend to be high in fat, sodium, and added sugar.  Shop around the outside edge of the grocery store. This includes fresh fruits and vegetables, bulk grains, fresh meats, and fresh dairy. Cooking  Use low-heat cooking methods, such as baking, instead of high-heat cooking methods like deep frying.  Cook using healthy oils, such as olive, canola, or sunflower oil.  Avoid cooking with butter, cream, or high-fat meats. Meal planning  Eat meals and snacks regularly, preferably at the same times every day. Avoid going long periods of time without eating.  Eat foods high in fiber, such as fresh fruits, vegetables, beans, and whole grains. Talk to your dietitian about how many servings of carbs you can eat at each meal.  Eat 4-6 ounces (oz) of lean protein each day, such as lean meat, chicken, fish, eggs, or tofu. One oz of lean protein is equal to: ? 1 oz of meat, chicken, or fish. ? 1 egg. ?  cup of tofu.  Eat some foods each day that contain healthy fats, such as avocado, nuts, seeds, and fish. Lifestyle  Check your blood glucose regularly.  Exercise regularly as told by your health care provider. This may include: ? 150 minutes of moderate-intensity or vigorous-intensity exercise each week. This could be brisk walking, biking, or water aerobics. ? Stretching and doing strength exercises, such as yoga or weightlifting, at least 2 times a week.  Take medicines as told by your health care provider.  Do not use any products that contain nicotine or tobacco, such as cigarettes and e-cigarettes. If you need help quitting, ask your health care provider.  Work with a Social worker or diabetes educator to identify strategies to manage stress and any emotional and social  challenges. Questions to ask a health care provider  Do I need to meet with a diabetes educator?  Do I need to meet with a dietitian?  What number can I call if I have questions?  When are the best times to check my blood glucose? Where to find more information:  American Diabetes Association: diabetes.org  Academy of Nutrition and Dietetics: www.eatright.[employer] of Diabetes and Digestive and Kidney Diseases (NIH): [employer] Summary  A healthy meal plan will help you control your blood glucose and maintain a healthy lifestyle.  Working with a diet and nutrition specialist (dietitian) can help you make a meal plan that is best for you.  Keep in mind that carbohydrates (carbs) and alcohol have immediate effects on your blood glucose levels. It is important to count carbs and to use alcohol carefully. This information is not intended to replace advice given to you by your health care provider. Make sure you discuss any questions you have with your health care provider. Document Released: 12/03/2004 Document Revised: 02/18/2017 Document Reviewed: 04/12/2016 Elsevier Patient Education  2020 Elsevier Inc.      Agustina Caroli, MD [facility]

## 2018-10-31 NOTE — Patient Instructions (Addendum)
If you have lab work done today you will be contacted with your lab results within the next 2 weeks.  If you have not heard from [place] then please contact us. The fastest way to get your results is to register for My Chart.   IF you received an x-ray today, you will receive an invoice from [facility]. Please contact [facility] at [phone] with questions or concerns regarding your invoice.   IF you received labwork today, you will receive an invoice from [facility]. Please contact [facility] at [phone] with questions or concerns regarding your invoice.   Our billing staff will not be able to assist you with questions regarding bills from these companies.  You will be contacted with the lab results as soon as they are available. The fastest way to get your results is to activate your My Chart account. Instructions are located on the last page of this paperwork. If you have not heard from [place] regarding the results in 2 weeks, please contact this office.     Eating Plan for Dialysis Dialysis is a treatment that cleans your blood. It is used when your kidneys are damaged. When you need dialysis, you should watch what you eat. This is because some nutrients can build up in your blood between treatments and make you sick. Your doctor or diet specialist (dietitian) will:  Tell you what nutrients you should include or avoid.  Tell you how much of these nutrients you should get each day.  Help you plan meals.  Tell you how much to drink each day. What are tips for following this plan? Reading food labels  Check food labels for: ? Potassium. This is found in milk, fruits, and vegetables. ? Phosphorus. This is found in milk, cheese, beans, nuts, and carbonated beverages. ? Salt (sodium). This is in processed meats, cured meats, ready-made frozen meals, canned vegetables, and salty snack foods.  Try to find foods that are low in potassium, phosphorus, and  sodium.  Look for foods that are labeled "sodium free," "reduced sodium," or "low sodium." Shopping  Do not buy whole-grain and high-fiber foods.  Do not buy or use salt substitutes.  Do not buy processed foods. Cooking  Drain all fluid from cooked vegetables and canned fruits before you eat them.  Before you cook potatoes, cut them into small pieces. Then boil them in unsalted water.  Try using herbs and spices that do not contain sodium to add flavor. Meal planning Most people on dialysis should try to eat:  6-11 servings of grains each day. One serving is equal to 1 slice of bread or  cup of cooked rice or pasta.  2-3 servings of low-potassium vegetables each day. One serving is equal to  cup.  2-3 servings of low-potassium fruits each day. One serving is equal to  cup.  Protein, such as meat, poultry, fish, and eggs. Talk with your doctor or dietitian about the right amount and type of protein to eat.   cup of dairy each day. General information  Follow your doctor's instructions about how much to drink. You may be told to: ? Write down what you drink. ? Write down the foods you eat that are made mostly from water, such as gelatin and soups. ? Drink from small cups.  Take vitamin and mineral supplements only as told by your doctor.  Take over-the-counter and prescription medicines only as told by your doctor. What foods can I eat?  Fruits Apples. Fresh or frozen berries. Fresh or canned pears, peaches, and pineapple. Grapes. Plums. Vegetables Fresh or frozen broccoli, carrots, and green beans. Cabbage. Cauliflower. Celery. Cucumbers. Eggplant. Radishes. Zucchini. Grains White bread. White rice. Cooked cereal. Unsalted popcorn. Tortillas. Pasta. Meats and other proteins Fresh or frozen beef, pork, chicken, and fish. Eggs. Dairy Cream cheese. Heavy cream. Ricotta cheese. Beverages Apple cider. Cranberry juice. Grape juice. Lemonade. Black coffee. Rice  milk (that is not enriched or fortified). Seasonings and condiments Herbs. Spices. Jam and jelly. Honey. Sweets and desserts Sherbet. Cakes. Cookies. Fats and oils Olive oil, canola oil, and safflower oil. Other foods Non-dairy creamer. Non-dairy whipped topping. Homemade broth without salt. The items listed above may not be a complete list of foods and beverages you can eat. Contact your dietitian for more options. What foods should I avoid? Fruits Star fruit. Bananas. Oranges. Kiwi. Nectarines. Prunes. Melon. Dried fruit. Avocado. Vegetables Potatoes. Beets. Tomatoes. Winter squash and pumpkin. Asparagus. Spinach. Parsnips. Grains Whole-grain bread. Whole-grain pasta. High-fiber cereal. Meats and other proteins Canned, smoked, and cured meats. Packaged lunch meat. Sardines. Nuts and seeds. Peanut butter. Beans and legumes. Dairy Milk. Buttermilk. Yogurt. Cheese and cottage cheese. Processed cheese spreads. Beverages Orange juice. Prune juice. Carbonated soft drinks. Seasonings and condiments Salt. Salt substitutes. Soy sauce. Sweets and desserts Ice cream. Chocolate. Candied nuts. Fats and oils Butter. Margarine. Other foods Ready-made frozen meals. Canned soups. The items listed above may not be a complete list of foods and beverages you should avoid. Contact your dietitian for more information. Summary  If you are having dialysis, it is important to watch what you eat. Certain nutrients and wastes can build up in your blood and cause you to get sick.  Your dietitian will help you make an eating plan that meets your needs.  Avoid foods that are high in potassium, salt (sodium), and phosphorus. Restrict fluids as told by your doctor or dietitian. This information is not intended to replace advice given to you by your health care provider. Make sure you discuss any questions you have with your health care provider. Document Released: 09/07/2011 Document Revised: 05/25/2017  Document Reviewed: 03/09/2017 Elsevier Patient Education  [place].  Diabetes Mellitus and Nutrition, Adult When you have diabetes (diabetes mellitus), it is very important to have healthy eating habits because your blood sugar (glucose) levels are greatly affected by what you eat and drink. Eating healthy foods in the appropriate amounts, at about the same times every day, can help you:  Control your blood glucose.  Lower your risk of heart disease.  Improve your blood pressure.  Reach or maintain a healthy weight. Every person with diabetes is different, and each person has different needs for a meal plan. Your health care provider may recommend that you work with a diet and nutrition specialist (dietitian) to make a meal plan that is best for you. Your meal plan may vary depending on factors such as:  The calories you need.  The medicines you take.  Your weight.  Your blood glucose, blood pressure, and cholesterol levels.  Your activity level.  Other health conditions you have, such as heart or kidney disease. How do carbohydrates affect me? Carbohydrates, also called carbs, affect your blood glucose level more than any other type of food. Eating carbs naturally raises the amount of glucose in your blood. Carb counting is a method for keeping track of how many carbs you eat. Counting carbs is important to keep your blood glucose  at a healthy level, especially if you use insulin or take certain oral diabetes medicines. It is important to know how many carbs you can safely have in each meal. This is different for every person. Your dietitian can help you calculate how many carbs you should have at each meal and for each snack. Foods that contain carbs include:  Bread, cereal, rice, pasta, and crackers.  Potatoes and corn.  Peas, beans, and lentils.  Milk and yogurt.  Fruit and juice.  Desserts, such as cakes, cookies, ice cream, and candy. How does alcohol affect  me? Alcohol can cause a sudden decrease in blood glucose (hypoglycemia), especially if you use insulin or take certain oral diabetes medicines. Hypoglycemia can be a life-threatening condition. Symptoms of hypoglycemia (sleepiness, dizziness, and confusion) are similar to symptoms of having too much alcohol. If your health care provider says that alcohol is safe for you, follow these guidelines:  Limit alcohol intake to no more than 1 drink per day for nonpregnant women and 2 drinks per day for men. One drink equals 12 oz of beer, 5 oz of wine, or 1 oz of hard liquor.  Do not drink on an empty stomach.  Keep yourself hydrated with water, diet soda, or unsweetened iced tea.  Keep in mind that regular soda, juice, and other mixers may contain a lot of sugar and must be counted as carbs. What are tips for following this plan?  Reading food labels  Start by checking the serving size on the "Nutrition Facts" label of packaged foods and drinks. The amount of calories, carbs, fats, and other nutrients listed on the label is based on one serving of the item. Many items contain more than one serving per package.  Check the total grams (g) of carbs in one serving. You can calculate the number of servings of carbs in one serving by dividing the total carbs by 15. For example, if a food has 30 g of total carbs, it would be equal to 2 servings of carbs.  Check the number of grams (g) of saturated and trans fats in one serving. Choose foods that have low or no amount of these fats.  Check the number of milligrams (mg) of salt (sodium) in one serving. Most people should limit total sodium intake to less than 2,300 mg per day.  Always check the nutrition information of foods labeled as "low-fat" or "nonfat". These foods may be higher in added sugar or refined carbs and should be avoided.  Talk to your dietitian to identify your daily goals for nutrients listed on the label. Shopping  Avoid buying  canned, premade, or processed foods. These foods tend to be high in fat, sodium, and added sugar.  Shop around the outside edge of the grocery store. This includes fresh fruits and vegetables, bulk grains, fresh meats, and fresh dairy. Cooking  Use low-heat cooking methods, such as baking, instead of high-heat cooking methods like deep frying.  Cook using healthy oils, such as olive, canola, or sunflower oil.  Avoid cooking with butter, cream, or high-fat meats. Meal planning  Eat meals and snacks regularly, preferably at the same times every day. Avoid going long periods of time without eating.  Eat foods high in fiber, such as fresh fruits, vegetables, beans, and whole grains. Talk to your dietitian about how many servings of carbs you can eat at each meal.  Eat 4-6 ounces (oz) of lean protein each day, such as lean meat, chicken, fish, eggs, or  tofu. One oz of lean protein is equal to: ? 1 oz of meat, chicken, or fish. ? 1 egg. ?  cup of tofu.  Eat some foods each day that contain healthy fats, such as avocado, nuts, seeds, and fish. Lifestyle  Check your blood glucose regularly.  Exercise regularly as told by your health care provider. This may include: ? 150 minutes of moderate-intensity or vigorous-intensity exercise each week. This could be brisk walking, biking, or water aerobics. ? Stretching and doing strength exercises, such as yoga or weightlifting, at least 2 times a week.  Take medicines as told by your health care provider.  Do not use any products that contain nicotine or tobacco, such as cigarettes and e-cigarettes. If you need help quitting, ask your health care provider.  Work with a Social worker or diabetes educator to identify strategies to manage stress and any emotional and social challenges. Questions to ask a health care provider  Do I need to meet with a diabetes educator?  Do I need to meet with a dietitian?  What number can I call if I have  questions?  When are the best times to check my blood glucose? Where to find more information:  American Diabetes Association: diabetes.org  Academy of Nutrition and Dietetics: www.eatright.[employer] of Diabetes and Digestive and Kidney Diseases (NIH): [employer] Summary  A healthy meal plan will help you control your blood glucose and maintain a healthy lifestyle.  Working with a diet and nutrition specialist (dietitian) can help you make a meal plan that is best for you.  Keep in mind that carbohydrates (carbs) and alcohol have immediate effects on your blood glucose levels. It is important to count carbs and to use alcohol carefully. This information is not intended to replace advice given to you by your health care provider. Make sure you discuss any questions you have with your health care provider. Document Released: 12/03/2004 Document Revised: 02/18/2017 Document Reviewed: 04/12/2016 Elsevier Patient Education  2020 [employer].

## 2018-11-01 ENCOUNTER — Telehealth: Payer: Self-pay

## 2018-11-01 DIAGNOSIS — E1129 Type 2 diabetes mellitus with other diabetic kidney complication: Secondary | ICD-10-CM | POA: Diagnosis not present

## 2018-11-01 DIAGNOSIS — N2581 Secondary hyperparathyroidism of renal origin: Secondary | ICD-10-CM | POA: Diagnosis not present

## 2018-11-01 DIAGNOSIS — E1143 Type 2 diabetes mellitus with diabetic autonomic (poly)neuropathy: Secondary | ICD-10-CM | POA: Diagnosis not present

## 2018-11-01 DIAGNOSIS — I5023 Acute on chronic systolic (congestive) heart failure: Secondary | ICD-10-CM | POA: Diagnosis not present

## 2018-11-01 DIAGNOSIS — Z89611 Acquired absence of right leg above knee: Secondary | ICD-10-CM | POA: Diagnosis not present

## 2018-11-01 DIAGNOSIS — D509 Iron deficiency anemia, unspecified: Secondary | ICD-10-CM | POA: Diagnosis not present

## 2018-11-01 DIAGNOSIS — D631 Anemia in chronic kidney disease: Secondary | ICD-10-CM | POA: Diagnosis not present

## 2018-11-01 DIAGNOSIS — Z89612 Acquired absence of left leg above knee: Secondary | ICD-10-CM | POA: Diagnosis not present

## 2018-11-01 DIAGNOSIS — E1151 Type 2 diabetes mellitus with diabetic peripheral angiopathy without gangrene: Secondary | ICD-10-CM | POA: Diagnosis not present

## 2018-11-01 DIAGNOSIS — Z794 Long term (current) use of insulin: Secondary | ICD-10-CM | POA: Diagnosis not present

## 2018-11-01 DIAGNOSIS — K3184 Gastroparesis: Secondary | ICD-10-CM | POA: Diagnosis not present

## 2018-11-01 DIAGNOSIS — G4730 Sleep apnea, unspecified: Secondary | ICD-10-CM | POA: Diagnosis not present

## 2018-11-01 DIAGNOSIS — I872 Venous insufficiency (chronic) (peripheral): Secondary | ICD-10-CM | POA: Diagnosis not present

## 2018-11-01 DIAGNOSIS — Z4781 Encounter for orthopedic aftercare following surgical amputation: Secondary | ICD-10-CM | POA: Diagnosis not present

## 2018-11-01 DIAGNOSIS — E1122 Type 2 diabetes mellitus with diabetic chronic kidney disease: Secondary | ICD-10-CM | POA: Diagnosis not present

## 2018-11-01 DIAGNOSIS — I132 Hypertensive heart and chronic kidney disease with heart failure and with stage 5 chronic kidney disease, or end stage renal disease: Secondary | ICD-10-CM | POA: Diagnosis not present

## 2018-11-01 DIAGNOSIS — Z992 Dependence on renal dialysis: Secondary | ICD-10-CM | POA: Diagnosis not present

## 2018-11-01 DIAGNOSIS — N186 End stage renal disease: Secondary | ICD-10-CM | POA: Diagnosis not present

## 2018-11-01 DIAGNOSIS — H409 Unspecified glaucoma: Secondary | ICD-10-CM | POA: Diagnosis not present

## 2018-11-01 DIAGNOSIS — E43 Unspecified severe protein-calorie malnutrition: Secondary | ICD-10-CM | POA: Diagnosis not present

## 2018-11-01 NOTE — Telephone Encounter (Signed)
[facility] called and advised that Dr. Sharol Given had written an order for a traipse bar for the pt who is a bilateral below the knee amputations. Physical therapist called and advised that [facility] has not delivered the DME to the pt because the order did not have ICD 10 codes. No one reached out to our office to advise of this so I will call [facility] or now [facility] to give information so they will deliver DME to patient.

## 2018-11-02 ENCOUNTER — Other Ambulatory Visit: Payer: Self-pay

## 2018-11-02 ENCOUNTER — Encounter: Payer: Self-pay | Admitting: Orthopedic Surgery

## 2018-11-02 ENCOUNTER — Ambulatory Visit (INDEPENDENT_AMBULATORY_CARE_PROVIDER_SITE_OTHER): Payer: Medicare Other | Admitting: Orthopedic Surgery

## 2018-11-02 VITALS — Ht 72.0 in | Wt 215.0 lb

## 2018-11-02 DIAGNOSIS — N186 End stage renal disease: Secondary | ICD-10-CM

## 2018-11-02 DIAGNOSIS — Z992 Dependence on renal dialysis: Secondary | ICD-10-CM

## 2018-11-02 DIAGNOSIS — Z89612 Acquired absence of left leg above knee: Secondary | ICD-10-CM

## 2018-11-02 DIAGNOSIS — S78111A Complete traumatic amputation at level between right hip and knee, initial encounter: Secondary | ICD-10-CM

## 2018-11-02 NOTE — Telephone Encounter (Signed)
Order written for pt and faxed to [facility] [phone].

## 2018-11-03 DIAGNOSIS — E1129 Type 2 diabetes mellitus with other diabetic kidney complication: Secondary | ICD-10-CM | POA: Diagnosis not present

## 2018-11-03 DIAGNOSIS — N186 End stage renal disease: Secondary | ICD-10-CM | POA: Diagnosis not present

## 2018-11-03 DIAGNOSIS — D509 Iron deficiency anemia, unspecified: Secondary | ICD-10-CM | POA: Diagnosis not present

## 2018-11-03 DIAGNOSIS — N2581 Secondary hyperparathyroidism of renal origin: Secondary | ICD-10-CM | POA: Diagnosis not present

## 2018-11-03 DIAGNOSIS — Z992 Dependence on renal dialysis: Secondary | ICD-10-CM | POA: Diagnosis not present

## 2018-11-03 DIAGNOSIS — D631 Anemia in chronic kidney disease: Secondary | ICD-10-CM | POA: Diagnosis not present

## 2018-11-06 DIAGNOSIS — Z992 Dependence on renal dialysis: Secondary | ICD-10-CM | POA: Diagnosis not present

## 2018-11-06 DIAGNOSIS — N2581 Secondary hyperparathyroidism of renal origin: Secondary | ICD-10-CM | POA: Diagnosis not present

## 2018-11-06 DIAGNOSIS — D631 Anemia in chronic kidney disease: Secondary | ICD-10-CM | POA: Diagnosis not present

## 2018-11-06 DIAGNOSIS — E1129 Type 2 diabetes mellitus with other diabetic kidney complication: Secondary | ICD-10-CM | POA: Diagnosis not present

## 2018-11-06 DIAGNOSIS — N186 End stage renal disease: Secondary | ICD-10-CM | POA: Diagnosis not present

## 2018-11-06 DIAGNOSIS — D509 Iron deficiency anemia, unspecified: Secondary | ICD-10-CM | POA: Diagnosis not present

## 2018-11-07 ENCOUNTER — Encounter: Payer: Self-pay | Admitting: Physical Medicine & Rehabilitation

## 2018-11-07 ENCOUNTER — Encounter: Payer: Self-pay | Admitting: Cardiovascular Disease

## 2018-11-07 ENCOUNTER — Other Ambulatory Visit: Payer: Self-pay

## 2018-11-07 ENCOUNTER — Encounter: Payer: Medicare Other | Attending: Registered Nurse | Admitting: Physical Medicine & Rehabilitation

## 2018-11-07 ENCOUNTER — Ambulatory Visit (INDEPENDENT_AMBULATORY_CARE_PROVIDER_SITE_OTHER): Payer: Medicare Other | Admitting: Cardiovascular Disease

## 2018-11-07 VITALS — BP 138/81 | HR 102 | Temp 99.1°F | Ht 72.0 in | Wt 215.0 lb

## 2018-11-07 DIAGNOSIS — G4730 Sleep apnea, unspecified: Secondary | ICD-10-CM | POA: Diagnosis not present

## 2018-11-07 DIAGNOSIS — I132 Hypertensive heart and chronic kidney disease with heart failure and with stage 5 chronic kidney disease, or end stage renal disease: Secondary | ICD-10-CM | POA: Diagnosis not present

## 2018-11-07 DIAGNOSIS — E1142 Type 2 diabetes mellitus with diabetic polyneuropathy: Secondary | ICD-10-CM | POA: Diagnosis not present

## 2018-11-07 DIAGNOSIS — G8918 Other acute postprocedural pain: Secondary | ICD-10-CM

## 2018-11-07 DIAGNOSIS — R269 Unspecified abnormalities of gait and mobility: Secondary | ICD-10-CM | POA: Diagnosis not present

## 2018-11-07 DIAGNOSIS — I110 Hypertensive heart disease with heart failure: Secondary | ICD-10-CM

## 2018-11-07 DIAGNOSIS — G4733 Obstructive sleep apnea (adult) (pediatric): Secondary | ICD-10-CM | POA: Diagnosis not present

## 2018-11-07 DIAGNOSIS — Z8249 Family history of ischemic heart disease and other diseases of the circulatory system: Secondary | ICD-10-CM | POA: Insufficient documentation

## 2018-11-07 DIAGNOSIS — Z4781 Encounter for orthopedic aftercare following surgical amputation: Secondary | ICD-10-CM | POA: Insufficient documentation

## 2018-11-07 DIAGNOSIS — I998 Other disorder of circulatory system: Secondary | ICD-10-CM | POA: Diagnosis not present

## 2018-11-07 DIAGNOSIS — I70262 Atherosclerosis of native arteries of extremities with gangrene, left leg: Secondary | ICD-10-CM | POA: Diagnosis not present

## 2018-11-07 DIAGNOSIS — I509 Heart failure, unspecified: Secondary | ICD-10-CM | POA: Insufficient documentation

## 2018-11-07 DIAGNOSIS — Z89611 Acquired absence of right leg above knee: Secondary | ICD-10-CM | POA: Diagnosis not present

## 2018-11-07 DIAGNOSIS — Z959 Presence of cardiac and vascular implant and graft, unspecified: Secondary | ICD-10-CM

## 2018-11-07 DIAGNOSIS — I5023 Acute on chronic systolic (congestive) heart failure: Secondary | ICD-10-CM

## 2018-11-07 DIAGNOSIS — E1143 Type 2 diabetes mellitus with diabetic autonomic (poly)neuropathy: Secondary | ICD-10-CM | POA: Diagnosis not present

## 2018-11-07 DIAGNOSIS — Z992 Dependence on renal dialysis: Secondary | ICD-10-CM | POA: Insufficient documentation

## 2018-11-07 DIAGNOSIS — Z833 Family history of diabetes mellitus: Secondary | ICD-10-CM | POA: Insufficient documentation

## 2018-11-07 DIAGNOSIS — N186 End stage renal disease: Secondary | ICD-10-CM | POA: Diagnosis not present

## 2018-11-07 DIAGNOSIS — Z89612 Acquired absence of left leg above knee: Secondary | ICD-10-CM | POA: Insufficient documentation

## 2018-11-07 DIAGNOSIS — Z9889 Other specified postprocedural states: Secondary | ICD-10-CM

## 2018-11-07 DIAGNOSIS — E1122 Type 2 diabetes mellitus with diabetic chronic kidney disease: Secondary | ICD-10-CM | POA: Insufficient documentation

## 2018-11-07 DIAGNOSIS — I70229 Atherosclerosis of native arteries of extremities with rest pain, unspecified extremity: Secondary | ICD-10-CM

## 2018-11-07 DIAGNOSIS — K3184 Gastroparesis: Secondary | ICD-10-CM | POA: Insufficient documentation

## 2018-11-07 NOTE — Patient Instructions (Signed)

## 2018-11-07 NOTE — Assessment & Plan Note (Signed)
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  The stumps have healed.  He is being considered for bilateral prostheses.

## 2018-11-07 NOTE — Progress Notes (Signed)
11/07/2018 Howard Bell   [birth year]  [id number]  Primary Physician Sagardia, Ines Bloomer, MD Primary Cardiologist: Lorretta Harp MD Lupe Carney, [place]  HPI:  Howard Bell is a 53 y.o.  moderately overweight married African-American male father of no children accompanied by his wife Howard Bell today.  He was referred by Dr. Dellia Nims, wound care specialist, for evaluation of a right foot wound.  I last saw him in the office 05/09/2018. He does have a history of chronic renal insufficiency on hemodialysis for last 9 years.  He has had a remote wound on his left foot with healed with aggressive local wound care.  His vascular risk factors include treated hypertension, diabetes and hyperlipidemia.  Is a question of remote MI and CVA.  He had cardiac catheterization performed in [facility] 10 years ago which was apparently normal.  He has a history of congestive heart failure as well.  Since I saw him 6 months ago he has had a right AKA soon after I saw him for nonhealing ulcer and critical limb ischemia and subsequent left AKA by Dr. Gavin Potters 09/08/2018.  He is currently being evaluated for bilateral lower extremity prostheses.  A 2D echo done during his recent hospitalization showed normal LV systolic function with diastolic dysfunction.  He denies chest pain or shortness of breath.  Current Meds  Medication Sig  . acetaminophen (TYLENOL) 325 MG tablet Take 1-2 tablets (325-650 mg total) by mouth every 4 (four) hours as needed for mild pain.  Marland Kitchen albuterol (PROVENTIL HFA;VENTOLIN HFA) 108 (90 Base) MCG/ACT inhaler Inhale 1 puff into the lungs every 4 (four) hours as needed for wheezing or shortness of breath.   Marland Kitchen amLODipine (NORVASC) 10 MG tablet Take 1 tablet (10 mg total) by mouth daily.  . brimonidine (ALPHAGAN) 0.2 % ophthalmic solution Place 1 drop into both eyes 2 (two) times daily.  Marland Kitchen docusate sodium (COLACE) 100 MG capsule Take 1 capsule (100 mg total) by mouth 2 (two)  times daily.  . ferric citrate (AURYXIA) 1 GM 210 MG(Fe) tablet Take 1 tablet (210 mg total) by mouth 3 (three) times daily with meals.  . fluticasone (FLONASE) 50 MCG/ACT nasal spray Place 1 spray into both nostrils daily.  Marland Kitchen gabapentin (NEURONTIN) 100 MG capsule Take 1 capsule (100 mg total) by mouth at bedtime.  Marland Kitchen glucose blood test strip 1 each by Other route as needed for other. Use as instructed  . insulin lispro (ADMELOG SOLOSTAR) 100 UNIT/ML KwikPen Inject 1-9 Units into the skin 3 (three) times daily. If bs is 121-150=1 unit, 151-200=2 units, 201-250=3 units, 251-300=5 units, 301-350=7 units, 351-400=9 units  . Lancets (ACCU-CHEK SOFT TOUCH) lancets Use as instructed  . latanoprost (XALATAN) 0.005 % ophthalmic solution Place 1 drop into both eyes at bedtime.  . metoCLOPramide (REGLAN) 5 MG tablet Take 1 tablet (5 mg total) by mouth 3 (three) times daily before meals.  . metoprolol succinate (TOPROL-XL) 50 MG 24 hr tablet Take 1 tablet (50 mg total) by mouth at bedtime. Take with a meal.  . multivitamin (RENA-VIT) TABS tablet Take 1 tablet by mouth at bedtime.  Marland Kitchen omeprazole (PRILOSEC) 20 MG capsule Take 1 capsule (20 mg total) by mouth at bedtime.  Marland Kitchen oxyCODONE (OXY IR/ROXICODONE) 5 MG immediate release tablet Take 1-2 tablets (5-10 mg total) by mouth every 8 (eight) hours as needed for severe pain.  Marland Kitchen timolol (BETIMOL) 0.5 % ophthalmic solution Place 1 drop into both eyes 2 (two) times daily.  Marland Kitchen  traMADol (ULTRAM) 50 MG tablet Take 1 tablet (50 mg total) by mouth every 12 (twelve) hours as needed for moderate pain.     Not on File  Social History   Socioeconomic History  . Marital status: Married    Spouse name: Not on file  . Number of children: Not on file  . Years of education: Not on file  . Highest education level: Not on file  Occupational History  . Not on file  Social Needs  . Financial resource strain: Not on file  . Food insecurity    Worry: Not on file    Inability:  Not on file  . Transportation needs    Medical: Not on file    Non-medical: Not on file  Tobacco Use  . Smoking status: Never Smoker  . Smokeless tobacco: Never Used  Substance and Sexual Activity  . Alcohol use: No  . Drug use: No  . Sexual activity: Not on file  Lifestyle  . Physical activity    Days per week: Not on file    Minutes per session: Not on file  . Stress: Not on file  Relationships  . Social Herbalist on phone: Not on file    Gets together: Not on file    Attends religious service: Not on file    Active member of club or organization: Not on file    Attends meetings of clubs or organizations: Not on file    Relationship status: Not on file  . Intimate partner violence    Fear of current or ex partner: Not on file    Emotionally abused: Not on file    Physically abused: Not on file    Forced sexual activity: Not on file  Other Topics Concern  . Not on file  Social History Narrative  . Not on file     Review of Systems: General: negative for chills, fever, night sweats or weight changes.  Cardiovascular: negative for chest pain, dyspnea on exertion, edema, orthopnea, palpitations, paroxysmal nocturnal dyspnea or shortness of breath Dermatological: negative for rash Respiratory: negative for cough or wheezing Urologic: negative for hematuria Abdominal: negative for nausea, vomiting, diarrhea, bright red blood per rectum, melena, or hematemesis Neurologic: negative for visual changes, syncope, or dizziness All other systems reviewed and are otherwise negative except as noted above.    Blood pressure 136/85, pulse 97, height 6' (1.829 m), weight 215 lb (97.5 kg), SpO2 (!) 85 %.  General appearance: alert and no distress Neck: no adenopathy, no carotid bruit, no JVD, supple, symmetrical, trachea midline and thyroid not enlarged, symmetric, no tenderness/mass/nodules Lungs: clear to auscultation bilaterally Heart: regular rate and rhythm, S1, S2  normal, no murmur, click, rub or gallop Extremities: Status post bilateral AKA's Pulses: No pulses since he has had bilateral AKA's Skin: Skin color, texture, turgor normal. No rashes or lesions Neurologic: Alert and oriented X 3, normal strength and tone. Normal symmetric reflexes. 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  The stumps have healed.  He is being considered for bilateral prostheses.  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 ([place]) History of essential hypertension blood pressure measured today 136/85.  He is on amlodipine and metoprolol.      Lorretta Harp MD FACP,FACC,FAHA, [facility] 11/07/2018 9:46 AM

## 2018-11-07 NOTE — Progress Notes (Signed)
Subjective:    Patient ID: Howard Bell, male    DOB: Dec 31, 1965, 53 y.o.   MRN: [id number]  HPI Male with history of T2DM with neuropathy and retinopathy, ESRD-hemodialysis MWF, OSA with chronic hypoxic respiratory failure, PVD with gangrenous changes BLE s/p right AKA presents for follow up for now b/l AKA.   Wife provides history. Last seen in clinic by NP on 10/10/2018.  Notes reviewed.  Since that time, his CBGs have been ~150. He is wearing Bipap at night, but states the hose is program.  He saw Ortho, stated wound was healing. He saw his PCP. He notes Gabapentin makes him lethargic. BP is controlled. He is still in therapies 2/week. Denies falls.  Pain Inventory Average Pain 0 Pain Right Now 0 My pain is na  In the last 24 hours, has pain interfered with the following? General activity 0 Relation with others 0 Enjoyment of life 0 What TIME of day is your pain at its worst? night Sleep (in general) Fair  Pain is worse with: some activites Pain improves with: medication Relief from Meds: 10  Mobility use a wheelchair  Function Do you have any goals in this area?  yes  Neuro/Psych No problems in this area  Prior Studies Any changes since last visit?  no  Physicians involved in your care Any changes since last visit?  no   Family History  Problem Relation Age of Onset  . Diabetes Mother   . Hypertension Father    Social History   Socioeconomic History  . Marital status: Married    Spouse name: Not on file  . Number of children: Not on file  . Years of education: Not on file  . Highest education level: Not on file  Occupational History  . Not on file  Social Needs  . Financial resource strain: Not on file  . Food insecurity    Worry: Not on file    Inability: Not on file  . Transportation needs    Medical: Not on file    Non-medical: Not on file  Tobacco Use  . Smoking status: Never Smoker  . Smokeless tobacco: Never Used  Substance and  Sexual Activity  . Alcohol use: No  . Drug use: No  . Sexual activity: Not on file  Lifestyle  . Physical activity    Days per week: Not on file    Minutes per session: Not on file  . Stress: Not on file  Relationships  . Social Herbalist on phone: Not on file    Gets together: Not on file    Attends religious service: Not on file    Active member of club or organization: Not on file    Attends meetings of clubs or organizations: Not on file    Relationship status: Not on file  Other Topics Concern  . Not on file  Social History Narrative  . Not on file   Past Surgical History:  Procedure Laterality Date  . AMPUTATION Right 05/14/2018   Procedure: AMPUTATION BELOW KNEE;  Surgeon: Newt Minion, MD;  Location: [place];  Service: Orthopedics;  Laterality: Right;  . AMPUTATION Right 08/04/2018   Procedure: RIGHT ABOVE KNEE AMPUTATION;  Surgeon: Newt Minion, MD;  Location: [place];  Service: Orthopedics;  Laterality: Right;  . AMPUTATION Left 09/08/2018   Procedure: LEFT ABOVE KNEE AMPUTATION;  Surgeon: Newt Minion, MD;  Location: [place];  Service: Orthopedics;  Laterality: Left;  .  APPLICATION OF WOUND VAC Left 09/08/2018   Procedure: Application Of Wound Vac;  Surgeon: Newt Minion, MD;  Location: [place];  Service: Orthopedics;  Laterality: Left;  . ESOPHAGOGASTRODUODENOSCOPY (EGD) WITH PROPOFOL N/A 12/05/2017   Procedure: ESOPHAGOGASTRODUODENOSCOPY (EGD) WITH PROPOFOL;  Surgeon: Doran Stabler, MD;  Location: [facility];  Service: Gastroenterology;  Laterality: N/A;  . HERNIA REPAIR    . IR AV DIALY SHUNT INTRO NEEDLE/INTRACATH INITIAL W/PTA/IMG LEFT  03/30/2017   Past Medical History:  Diagnosis Date  . Arthritis   . Congestive heart failure (CHF) ([place])   . Depression   . 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 Diabetic gastroparesis ([place]) 11/06/2017  . ESRD (end stage renal disease) on dialysis [facility])    M,W.F dialysis  . H/O  seasonal allergies   . Hypertension   . Pneumonia   . Sleep apnea    not using it now, on continuous O2 2 L during the day, 3 L during the night   BP 138/81   Pulse (!) 102   Temp 99.1 F (37.3 C)   Ht 6' (1.829 m)   Wt 215 lb (97.5 kg)   SpO2 91%   BMI 29.16 kg/m   Opioid Risk Score:   Fall Risk Score:  `1  Depression screen PHQ 2/9  Depression screen [facility] 2/9 10/31/2018 04/01/2018 09/13/2017 07/26/2017 05/07/2017  Decreased Interest 0 0 1 0 0  Down, Depressed, Hopeless 0 0 2 0 2  PHQ - 2 Score 0 0 3 0 2  Altered sleeping - - 3 - 3  Tired, decreased energy - - 1 - 1  Change in appetite - - 2 - 0  Feeling bad or failure about yourself  - - 3 - 2  Trouble concentrating - - 2 - 2  Moving slowly or fidgety/restless - - 3 - 1  Suicidal thoughts - - 2 - 2  PHQ-9 Score - - 19 - 13  Difficult doing work/chores - - - - Somewhat difficult     Review of Systems  Constitutional: Negative.   HENT: Negative.   Eyes: Negative.   Respiratory: Negative.   Cardiovascular: Negative.   Gastrointestinal: Negative.   Endocrine: Negative.   Genitourinary: Negative.   Musculoskeletal: Positive for gait problem.  Skin: Negative.   Allergic/Immunologic: Negative.   Hematological: Negative.   Psychiatric/Behavioral: Negative.        Objective:   Physical Exam  Constitutional: No distress . Vital signs reviewed. HENT: Normocephalic. Atraumatic.  Eyes: EOMI. No discharge. Cardiovascular: No JVD. Respiratory: Normal effort.  GI: Non-distended. Musc: B/l AKA  Neurological: Alert Motor: Bilateral UE 5/5 proximal to distal.  RLE: 5/5 hip flexion LLE: 5/5 hip flexion Skin:  B/l AKAs with some scabbing Psychiatric: Flat     Assessment & Plan:  Male with history of T2DM with neuropathy and retinopathy, ESRD-hemodialysis MWF, OSA with chronic hypoxic respiratory failure, PVD with gangrenous changes BLE s/p right AKA presents for follow up for now b/l AKA  1.  Functional deficits  secondary to PAD, with b/l AKA  Cont follow up with Ortho  Cont therapies  2. Pain Management:   Encouraged weaning Oxycodone and tramadol, rarely using  Gabapentin d/ced by wife, stating side effects  Relatively stable at present  3. OSA/Chronic respiratory failure:   Follow up with sleep specialist for BiPAP device (hose broken)  4. T2DM with gastroparesis and neuropathy:   Brittle diabetic. Continue SSI as at home.  Appears relatively controlled at present  Follow up with PCP  5. Gait abnormality  Cont wheelchair for safety  Cont therapies

## 2018-11-07 NOTE — Assessment & Plan Note (Signed)
History of essential hypertension blood pressure measured today 136/85.  He is on amlodipine and metoprolol.

## 2018-11-08 DIAGNOSIS — E1129 Type 2 diabetes mellitus with other diabetic kidney complication: Secondary | ICD-10-CM | POA: Diagnosis not present

## 2018-11-08 DIAGNOSIS — N2581 Secondary hyperparathyroidism of renal origin: Secondary | ICD-10-CM | POA: Diagnosis not present

## 2018-11-08 DIAGNOSIS — Z992 Dependence on renal dialysis: Secondary | ICD-10-CM | POA: Diagnosis not present

## 2018-11-08 DIAGNOSIS — D631 Anemia in chronic kidney disease: Secondary | ICD-10-CM | POA: Diagnosis not present

## 2018-11-08 DIAGNOSIS — N186 End stage renal disease: Secondary | ICD-10-CM | POA: Diagnosis not present

## 2018-11-08 DIAGNOSIS — D509 Iron deficiency anemia, unspecified: Secondary | ICD-10-CM | POA: Diagnosis not present

## 2018-11-09 DIAGNOSIS — H524 Presbyopia: Secondary | ICD-10-CM | POA: Diagnosis not present

## 2018-11-09 DIAGNOSIS — I5023 Acute on chronic systolic (congestive) heart failure: Secondary | ICD-10-CM | POA: Diagnosis not present

## 2018-11-09 DIAGNOSIS — H5203 Hypermetropia, bilateral: Secondary | ICD-10-CM | POA: Diagnosis not present

## 2018-11-09 DIAGNOSIS — E1122 Type 2 diabetes mellitus with diabetic chronic kidney disease: Secondary | ICD-10-CM | POA: Diagnosis not present

## 2018-11-09 DIAGNOSIS — Z135 Encounter for screening for eye and ear disorders: Secondary | ICD-10-CM | POA: Diagnosis not present

## 2018-11-09 DIAGNOSIS — E1151 Type 2 diabetes mellitus with diabetic peripheral angiopathy without gangrene: Secondary | ICD-10-CM | POA: Diagnosis not present

## 2018-11-09 DIAGNOSIS — Z4781 Encounter for orthopedic aftercare following surgical amputation: Secondary | ICD-10-CM | POA: Diagnosis not present

## 2018-11-09 DIAGNOSIS — Z961 Presence of intraocular lens: Secondary | ICD-10-CM | POA: Diagnosis not present

## 2018-11-09 DIAGNOSIS — I872 Venous insufficiency (chronic) (peripheral): Secondary | ICD-10-CM | POA: Diagnosis not present

## 2018-11-09 DIAGNOSIS — I132 Hypertensive heart and chronic kidney disease with heart failure and with stage 5 chronic kidney disease, or end stage renal disease: Secondary | ICD-10-CM | POA: Diagnosis not present

## 2018-11-10 ENCOUNTER — Other Ambulatory Visit: Payer: Self-pay

## 2018-11-10 DIAGNOSIS — E1129 Type 2 diabetes mellitus with other diabetic kidney complication: Secondary | ICD-10-CM | POA: Diagnosis not present

## 2018-11-10 DIAGNOSIS — N186 End stage renal disease: Secondary | ICD-10-CM | POA: Diagnosis not present

## 2018-11-10 DIAGNOSIS — D631 Anemia in chronic kidney disease: Secondary | ICD-10-CM | POA: Diagnosis not present

## 2018-11-10 DIAGNOSIS — D509 Iron deficiency anemia, unspecified: Secondary | ICD-10-CM | POA: Diagnosis not present

## 2018-11-10 DIAGNOSIS — N2581 Secondary hyperparathyroidism of renal origin: Secondary | ICD-10-CM | POA: Diagnosis not present

## 2018-11-10 DIAGNOSIS — Z992 Dependence on renal dialysis: Secondary | ICD-10-CM | POA: Diagnosis not present

## 2018-11-13 ENCOUNTER — Telehealth: Payer: Self-pay | Admitting: *Deleted

## 2018-11-13 DIAGNOSIS — N186 End stage renal disease: Secondary | ICD-10-CM | POA: Diagnosis not present

## 2018-11-13 DIAGNOSIS — N2581 Secondary hyperparathyroidism of renal origin: Secondary | ICD-10-CM | POA: Diagnosis not present

## 2018-11-13 DIAGNOSIS — Z992 Dependence on renal dialysis: Secondary | ICD-10-CM | POA: Diagnosis not present

## 2018-11-13 DIAGNOSIS — D509 Iron deficiency anemia, unspecified: Secondary | ICD-10-CM | POA: Diagnosis not present

## 2018-11-13 DIAGNOSIS — D631 Anemia in chronic kidney disease: Secondary | ICD-10-CM | POA: Diagnosis not present

## 2018-11-13 DIAGNOSIS — E1129 Type 2 diabetes mellitus with other diabetic kidney complication: Secondary | ICD-10-CM | POA: Diagnosis not present

## 2018-11-13 NOTE — Telephone Encounter (Signed)
Faxed completed for to [facility] with the original cover sheet. Confirmation page received at 5:50 pm. Original to be scanned.

## 2018-11-14 ENCOUNTER — Encounter: Payer: Self-pay | Admitting: Endocrinology

## 2018-11-14 ENCOUNTER — Ambulatory Visit (INDEPENDENT_AMBULATORY_CARE_PROVIDER_SITE_OTHER): Payer: Medicare Other | Admitting: Endocrinology

## 2018-11-14 ENCOUNTER — Other Ambulatory Visit: Payer: Self-pay

## 2018-11-14 VITALS — BP 104/60 | HR 92 | Ht 72.0 in

## 2018-11-14 DIAGNOSIS — E1042 Type 1 diabetes mellitus with diabetic polyneuropathy: Secondary | ICD-10-CM | POA: Diagnosis not present

## 2018-11-14 DIAGNOSIS — E119 Type 2 diabetes mellitus without complications: Secondary | ICD-10-CM

## 2018-11-14 DIAGNOSIS — E1022 Type 1 diabetes mellitus with diabetic chronic kidney disease: Secondary | ICD-10-CM | POA: Diagnosis not present

## 2018-11-14 DIAGNOSIS — E118 Type 2 diabetes mellitus with unspecified complications: Secondary | ICD-10-CM | POA: Diagnosis not present

## 2018-11-14 DIAGNOSIS — N189 Chronic kidney disease, unspecified: Secondary | ICD-10-CM

## 2018-11-14 LAB — POCT GLYCOSYLATED HEMOGLOBIN (HGB A1C): Hemoglobin A1C: 5.6 % (ref 4.0–5.6)

## 2018-11-14 NOTE — Patient Instructions (Addendum)
check your blood sugar twice a day.  vary the time of day when you check, between before the 3 meals, and at bedtime.  also check if you have symptoms of your blood sugar being too high or too low.  please keep a record of the readings and bring it to your next appointment here (or you can bring the meter itself).  You can write it on any piece of paper.  please call us sooner if your blood sugar goes below 70, or if you have a lot of readings over 200. A different type of diabetes blood test is requested for you today.  We'll let you know about the results.   Please come back for a follow-up appointment in 2 months.

## 2018-11-14 NOTE — Progress Notes (Signed)
Subjective:    Patient ID: Howard Bell, male    DOB: [birth year], 53 y.o.   MRN: [id number]  HPI Pt returns for f/u of diabetes mellitus: DM type: 1 Dx'ed: [id number] Complications: polyneuropathy and ESRD ([place]) Therapy: insulin since soon after dx.  DKA: last episode was 2003 Severe hypoglycemia: last episode was in 2008 Pancreatitis: never Pancreatic imaging:  Other: he takes multiple daily injections Interval history: He takes humalog just PRN (averages just 5 units totoal per day).  no cbg record, but states cbg's vary from 150-245.   Past Medical History:  Diagnosis Date  . Arthritis   . Congestive heart failure (CHF) ([place])   . Depression   . 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 Diabetic gastroparesis ([place]) 11/06/2017  . ESRD (end stage renal disease) on dialysis [facility])    M,W.F dialysis  . H/O seasonal allergies   . Hypertension   . Pneumonia   . Sleep apnea    not using it now, on continuous O2 2 L during the day, 3 L during the night    Past Surgical History:  Procedure Laterality Date  . AMPUTATION Right 05/14/2018   Procedure: AMPUTATION BELOW KNEE;  Surgeon: Newt Minion, MD;  Location: [place];  Service: Orthopedics;  Laterality: Right;  . AMPUTATION Right 08/04/2018   Procedure: RIGHT ABOVE KNEE AMPUTATION;  Surgeon: Newt Minion, MD;  Location: [place];  Service: Orthopedics;  Laterality: Right;  . AMPUTATION Left 09/08/2018   Procedure: LEFT ABOVE KNEE AMPUTATION;  Surgeon: Newt Minion, MD;  Location: [place];  Service: Orthopedics;  Laterality: Left;  . APPLICATION OF WOUND VAC Left 09/08/2018   Procedure: Application Of Wound Vac;  Surgeon: Newt Minion, MD;  Location: [place];  Service: Orthopedics;  Laterality: Left;  . ESOPHAGOGASTRODUODENOSCOPY (EGD) WITH PROPOFOL N/A 12/05/2017   Procedure: ESOPHAGOGASTRODUODENOSCOPY (EGD) WITH PROPOFOL;  Surgeon: Doran Stabler, MD;  [facility];  Service: Gastroenterology;   Laterality: N/A;  . HERNIA REPAIR    . IR AV DIALY SHUNT INTRO NEEDLE/INTRACATH INITIAL W/PTA/IMG LEFT  03/30/2017    Social History   Socioeconomic History  . Marital status: Married    Spouse name: Not on file  . Number of children: Not on file  . Years of education: Not on file  . Highest education level: Not on file  Occupational History  . Not on file  Social Needs  . Financial resource strain: Not on file  . Food insecurity    Worry: Not on file    Inability: Not on file  . Transportation needs    Medical: Not on file    Non-medical: Not on file  Tobacco Use  . Smoking status: Never Smoker  . Smokeless tobacco: Never Used  Substance and Sexual Activity  . Alcohol use: No  . Drug use: No  . Sexual activity: Not on file  Lifestyle  . Physical activity    Days per week: Not on file    Minutes per session: Not on file  . Stress: Not on file  Relationships  . Social Herbalist on phone: Not on file    Gets together: Not on file    Attends religious service: Not on file    Active member of club or organization: Not on file    Attends meetings of clubs or organizations: Not on file    Relationship status: Not on file  . Intimate  partner violence    Fear of current or ex partner: Not on file    Emotionally abused: Not on file    Physically abused: Not on file    Forced sexual activity: Not on file  Other Topics Concern  . Not on file  Social History Narrative  . Not on file    Current Outpatient Medications on File Prior to Visit  Medication Sig Dispense Refill  . acetaminophen (TYLENOL) 325 MG tablet Take 1-2 tablets (325-650 mg total) by mouth every 4 (four) hours as needed for mild pain.    Marland Kitchen albuterol (PROVENTIL HFA;VENTOLIN HFA) 108 (90 Base) MCG/ACT inhaler Inhale 1 puff into the lungs every 4 (four) hours as needed for wheezing or shortness of breath.     Marland Kitchen amLODipine (NORVASC) 10 MG tablet Take 1 tablet (10 mg total) by mouth daily. 30 tablet 1   . brimonidine (ALPHAGAN) 0.2 % ophthalmic solution Place 1 drop into both eyes 2 (two) times daily.    Marland Kitchen docusate sodium (COLACE) 100 MG capsule Take 1 capsule (100 mg total) by mouth 2 (two) times daily. 60 capsule 1  . ferric citrate (AURYXIA) 1 GM 210 MG(Fe) tablet Take 1 tablet (210 mg total) by mouth 3 (three) times daily with meals. 90 tablet 1  . fluticasone (FLONASE) 50 MCG/ACT nasal spray Place 1 spray into both nostrils daily.    Marland Kitchen glucose blood test strip 1 each by Other route as needed for other. Use as instructed 100 each 11  . insulin lispro (ADMELOG SOLOSTAR) 100 UNIT/ML KwikPen Inject 1-9 Units into the skin 3 (three) times daily. If bs is 121-150=1 unit, 151-200=2 units, 201-250=3 units, 251-300=5 units, 301-350=7 units, 351-400=9 units    . Lancets (ACCU-CHEK SOFT TOUCH) lancets Use as instructed 100 each 12  . latanoprost (XALATAN) 0.005 % ophthalmic solution Place 1 drop into both eyes at bedtime.    . metoCLOPramide (REGLAN) 5 MG tablet Take 1 tablet (5 mg total) by mouth 3 (three) times daily before meals. 90 tablet 0  . metoprolol succinate (TOPROL-XL) 50 MG 24 hr tablet Take 1 tablet (50 mg total) by mouth at bedtime. Take with a meal. 90 tablet 3  . multivitamin (RENA-VIT) TABS tablet Take 1 tablet by mouth at bedtime. 30 tablet 1  . omeprazole (PRILOSEC) 20 MG capsule Take 1 capsule (20 mg total) by mouth at bedtime. 90 capsule 3  . oxyCODONE (OXY IR/ROXICODONE) 5 MG immediate release tablet Take 1-2 tablets (5-10 mg total) by mouth every 8 (eight) hours as needed for severe pain. 30 tablet 0  . timolol (BETIMOL) 0.5 % ophthalmic solution Place 1 drop into both eyes 2 (two) times daily. 10 mL 1  . traMADol (ULTRAM) 50 MG tablet Take 1 tablet (50 mg total) by mouth every 12 (twelve) hours as needed for moderate pain. 28 tablet 0   No current facility-administered medications on file prior to visit.     Allergies  Allergen Reactions  . Gabapentin Other (See Comments)     Altered mental status    Family History  Problem Relation Age of Onset  . Diabetes Mother   . Hypertension Father     BP 104/60 (BP Location: Right Arm, Patient Position: Sitting, Cuff Size: Large)   Pulse 92   Ht 6' (1.829 m)   SpO2 (!) 86%   BMI 29.16 kg/m   Review of Systems He denies hypoglycemia.      Objective:   Physical Exam VITAL SIGNS:  See vs page GENERAL: no distress.  In wheelchair Ext: bilat AKA   Lab Results  Component Value Date   HGBA1C 5.6 11/14/2018       Assessment & Plan:  Type 1 DM, with PN: poss overcontrolled.  Renal failure: in this setting, fructosamine is a more reliable indicator of glycemic control.     Patient Instructions  check your blood sugar twice a day.  vary the time of day when you check, between before the 3 meals, and at bedtime.  also check if you have symptoms of your blood sugar being too high or too low.  please keep a record of the readings and bring it to your next appointment here (or you can bring the meter itself).  You can write it on any piece of paper.  please call us sooner if your blood sugar goes below 70, or if you have a lot of readings over 200. A different type of diabetes blood test is requested for you today.  We'll let you know about the results.   Please come back for a follow-up appointment in 2 months.

## 2018-11-15 ENCOUNTER — Telehealth: Payer: Self-pay | Admitting: Orthopedic Surgery

## 2018-11-15 ENCOUNTER — Telehealth: Payer: Self-pay | Admitting: *Deleted

## 2018-11-15 DIAGNOSIS — D509 Iron deficiency anemia, unspecified: Secondary | ICD-10-CM | POA: Diagnosis not present

## 2018-11-15 DIAGNOSIS — N2581 Secondary hyperparathyroidism of renal origin: Secondary | ICD-10-CM | POA: Diagnosis not present

## 2018-11-15 DIAGNOSIS — N186 End stage renal disease: Secondary | ICD-10-CM | POA: Diagnosis not present

## 2018-11-15 DIAGNOSIS — E1129 Type 2 diabetes mellitus with other diabetic kidney complication: Secondary | ICD-10-CM | POA: Diagnosis not present

## 2018-11-15 DIAGNOSIS — D631 Anemia in chronic kidney disease: Secondary | ICD-10-CM | POA: Diagnosis not present

## 2018-11-15 DIAGNOSIS — Z992 Dependence on renal dialysis: Secondary | ICD-10-CM | POA: Diagnosis not present

## 2018-11-15 NOTE — Telephone Encounter (Signed)
On 11/14/2018, faxed completed certificate of medical necessity to [facility]. Confirmation page received at 5:49 pm.

## 2018-11-15 NOTE — Telephone Encounter (Signed)
Patient's wife called requesting a call back in regards to a referral for the patient to get prosthetic legs.  CB#[phone].  Thank you.

## 2018-11-17 DIAGNOSIS — E1129 Type 2 diabetes mellitus with other diabetic kidney complication: Secondary | ICD-10-CM | POA: Diagnosis not present

## 2018-11-17 DIAGNOSIS — D631 Anemia in chronic kidney disease: Secondary | ICD-10-CM | POA: Diagnosis not present

## 2018-11-17 DIAGNOSIS — Z992 Dependence on renal dialysis: Secondary | ICD-10-CM | POA: Diagnosis not present

## 2018-11-17 DIAGNOSIS — N186 End stage renal disease: Secondary | ICD-10-CM | POA: Diagnosis not present

## 2018-11-17 DIAGNOSIS — N2581 Secondary hyperparathyroidism of renal origin: Secondary | ICD-10-CM | POA: Diagnosis not present

## 2018-11-17 DIAGNOSIS — D509 Iron deficiency anemia, unspecified: Secondary | ICD-10-CM | POA: Diagnosis not present

## 2018-11-18 ENCOUNTER — Encounter: Payer: Self-pay | Admitting: Orthopedic Surgery

## 2018-11-18 LAB — FRUCTOSAMINE: Fructosamine: 420 umol/L — ABNORMAL HIGH (ref 205–285)

## 2018-11-18 NOTE — Progress Notes (Signed)
Office Visit Note   Patient: Howard Bell           Date of Birth: [birth year]           MRN: [id number] Visit Date: 11/02/2018              Requested by: Horald Pollen, MD [place],  [place] [id number] PCP: Horald Pollen, MD  Chief Complaint  Patient presents with  . Right Leg - Routine Post Op    Bilateral AKA  . Left Leg - Routine Post Op      HPI: Patient is a 53 year old gentleman who presents follow-up status post bilateral above-knee amputations.  Patient is currently working with biotech he complains of a scab over the lateral side of the left above-the-knee amputation but otherwise states he is healing well he is wearing stump shrinker's bilaterally.  Patient is 2 months out from surgery.  Assessment & Plan: Visit Diagnoses:  1. Unilateral AKA, right ([place])   2. ESRD on dialysis ([place])   3. Hx of AKA (above knee amputation), left ([place])     Plan: Patient will follow-up with biotech for prosthetic fitting.  Patient has an appointment with Shirlean Mylar to see if he has the strength to qualify for prosthesis fittings.  Follow-Up Instructions: Return in about 4 weeks (around 11/30/2018).   Ortho Exam  Patient is alert, oriented, no adenopathy, well-dressed, normal affect, normal respiratory effort. On examination the incisions have healed nicely there is no redness no cellulitis no drainage no signs of infection.  Imaging: No results found.    Labs: Lab Results  Component Value Date   HGBA1C 5.6 11/14/2018   HGBA1C 6.4 (H) 09/10/2018   HGBA1C 7.5 (H) 08/17/2018   ESRSEDRATE >140 (H) 08/15/2018   CRP 20.5 (H) 08/15/2018   REPTSTATUS 08/20/2018 FINAL 08/15/2018   CULT  08/15/2018    NO GROWTH 5 DAYS Performed at [facility], [place] [address], [place], [place] [address]    LABORGA PROTEUS MIRABILIS 05/12/2018     Lab Results  Component Value Date   ALBUMIN 2.6 (L) 09/29/2018   ALBUMIN 2.5 (L) 09/27/2018   ALBUMIN 2.4 (L)  09/25/2018   PREALBUMIN 14.6 (L) 08/15/2018    Lab Results  Component Value Date   MG 2.1 09/08/2018   MG 2.0 08/17/2018   MG 2.8 (H) 03/30/2017   No results found for: [facility]  Lab Results  Component Value Date   PREALBUMIN 14.6 (L) 08/15/2018   CBC EXTENDED Latest Ref Rng & Units 09/29/2018 09/27/2018 09/25/2018  WBC 4.0 - 10.5 K/uL 6.3 7.6 7.0  RBC 4.22 - 5.81 MIL/uL 2.75(L) 2.62(L) 2.46(L)  HGB 13.0 - 17.0 g/dL 8.5(L) 8.4(L) 7.3(L)  HCT 39.0 - 52.0 % 27.3(L) 26.0(L) 24.3(L)  PLT 150 - 400 K/uL 208 207 233  NEUTROABS 1.7 - 7.7 K/uL - - -  LYMPHSABS 0.7 - 4.0 K/uL - - -     Body mass index is 29.16 kg/m.  Orders:  No orders of the defined types were placed in this encounter.  No orders of the defined types were placed in this encounter.    Procedures: No procedures performed  Clinical Data: No additional findings.  ROS:  All other systems negative, except as noted in the HPI. Review of Systems  Objective: Vital Signs: Ht 6' (1.829 m)   Wt 215 lb (97.5 kg)   BMI 29.16 kg/m   Specialty Comments:  No specialty comments available.  PMFS History:  Patient Active Problem List   Diagnosis Date Noted  . S/P AKA (above knee amputation) bilateral ([place]) 11/07/2018  . Chronic hypercapnic respiratory failure ([place]) 10/02/2018  . OSA (obstructive sleep apnea)   . S/P AKA (above knee amputation) unilateral, right ([place])   . Diabetic peripheral neuropathy ([place])   . Status post bilateral above knee amputation ([place]) 09/08/2018  . Atherosclerosis of native arteries of extremities with gangrene, left leg ([place]) 09/05/2018  . Peripheral arterial disease ([place])   . Unilateral AKA, right ([place]) 08/17/2018  . Drug induced constipation   . Uncontrolled diabetes mellitus type 2 with peripheral artery disease ([place])   . Anemia of chronic disease   . Diabetes mellitus type 2 in nonobese (HCC)   . Supplemental oxygen dependent   . Unilateral AKA, left ([place]) 08/07/2018  . S/P BKA  (below knee amputation), right ([place]) 08/04/2018  . Critical limb ischemia with history of revascularization of same extremity 05/09/2018  . 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 ([place]) 11/16/2017  . CKD stage 5 due to type 2 diabetes mellitus ([place]) 11/16/2017  . Glaucoma due to type 2 diabetes mellitus ([place]) 11/16/2017  . Chronic generalized pain 11/16/2017  . Recurrent pneumonia 11/06/2017  . Diabetic gastroparesis ([place]) 11/06/2017  . Generalized abdominal pain 09/13/2017  . ESRD on dialysis ([place]) 03/28/2017  . Anemia due to end stage renal disease ([place]) 08/04/2016   Past Medical History:  Diagnosis Date  . Arthritis   . Congestive heart failure (CHF) ([place])   . Depression   . 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 Diabetic gastroparesis ([place]) 11/06/2017  . ESRD (end stage renal disease) on dialysis [facility])    M,W.F dialysis  . H/O seasonal allergies   . Hypertension   . Pneumonia   . Sleep apnea    not using it now, on continuous O2 2 L during the day, 3 L during the night    Family History  Problem Relation Age of Onset  . Diabetes Mother   . Hypertension Father     Past Surgical History:  Procedure Laterality Date  . AMPUTATION Right 05/14/2018   Procedure: AMPUTATION BELOW KNEE;  Surgeon: Newt Minion, MD;  Location: [facility];  Service: Orthopedics;  Laterality: Right;  . AMPUTATION Right 08/04/2018   Procedure: RIGHT ABOVE KNEE AMPUTATION;  Surgeon: Newt Minion, MD;  Location: [facility];  Service: Orthopedics;  Laterality: Right;  . AMPUTATION Left 09/08/2018   Procedure: LEFT ABOVE KNEE AMPUTATION;  Surgeon: Newt Minion, MD;  Location: [facility];  Service: Orthopedics;  Laterality: Left;  . APPLICATION OF WOUND VAC Left 09/08/2018   Procedure: Application Of Wound Vac;  Surgeon: Newt Minion, MD;  Location: [facility];   Service: Orthopedics;  Laterality: Left;  . ESOPHAGOGASTRODUODENOSCOPY (EGD) WITH PROPOFOL N/A 12/05/2017   Procedure: ESOPHAGOGASTRODUODENOSCOPY (EGD) WITH PROPOFOL;  Surgeon: Doran Stabler, MD;  [facility];  Service: Gastroenterology;  Laterality: N/A;  . HERNIA REPAIR    . IR AV DIALY SHUNT INTRO NEEDLE/INTRACATH INITIAL W/PTA/IMG LEFT  03/30/2017   Social History   Occupational History  . Not on file  Tobacco Use  . Smoking status: Never Smoker  . Smokeless tobacco: Never Used  Substance and Sexual Activity  . Alcohol use: No  . Drug use: No  . Sexual activity: Not on  file

## 2018-11-20 ENCOUNTER — Other Ambulatory Visit: Payer: Self-pay

## 2018-11-20 DIAGNOSIS — N186 End stage renal disease: Secondary | ICD-10-CM | POA: Diagnosis not present

## 2018-11-20 DIAGNOSIS — Z992 Dependence on renal dialysis: Secondary | ICD-10-CM | POA: Diagnosis not present

## 2018-11-20 DIAGNOSIS — D631 Anemia in chronic kidney disease: Secondary | ICD-10-CM | POA: Diagnosis not present

## 2018-11-20 DIAGNOSIS — Z89511 Acquired absence of right leg below knee: Secondary | ICD-10-CM

## 2018-11-20 DIAGNOSIS — Z89612 Acquired absence of left leg above knee: Secondary | ICD-10-CM

## 2018-11-20 DIAGNOSIS — D509 Iron deficiency anemia, unspecified: Secondary | ICD-10-CM | POA: Diagnosis not present

## 2018-11-20 DIAGNOSIS — N2581 Secondary hyperparathyroidism of renal origin: Secondary | ICD-10-CM | POA: Diagnosis not present

## 2018-11-20 DIAGNOSIS — E1129 Type 2 diabetes mellitus with other diabetic kidney complication: Secondary | ICD-10-CM | POA: Diagnosis not present

## 2018-11-20 NOTE — Telephone Encounter (Signed)
Called and sw pt's wife she states that he needs referral for physical therapy. Order in chart for [facility] neuro rehab for eval if pt is candidate for prosthetic legs. Hx bilateral AKAs

## 2018-11-21 ENCOUNTER — Telehealth: Payer: Self-pay

## 2018-11-21 DIAGNOSIS — Z992 Dependence on renal dialysis: Secondary | ICD-10-CM | POA: Diagnosis not present

## 2018-11-21 DIAGNOSIS — E1122 Type 2 diabetes mellitus with diabetic chronic kidney disease: Secondary | ICD-10-CM | POA: Diagnosis not present

## 2018-11-21 DIAGNOSIS — Z4781 Encounter for orthopedic aftercare following surgical amputation: Secondary | ICD-10-CM | POA: Diagnosis not present

## 2018-11-21 DIAGNOSIS — Z23 Encounter for immunization: Secondary | ICD-10-CM | POA: Diagnosis not present

## 2018-11-21 DIAGNOSIS — N186 End stage renal disease: Secondary | ICD-10-CM | POA: Diagnosis not present

## 2018-11-21 DIAGNOSIS — I872 Venous insufficiency (chronic) (peripheral): Secondary | ICD-10-CM | POA: Diagnosis not present

## 2018-11-21 DIAGNOSIS — I132 Hypertensive heart and chronic kidney disease with heart failure and with stage 5 chronic kidney disease, or end stage renal disease: Secondary | ICD-10-CM | POA: Diagnosis not present

## 2018-11-21 DIAGNOSIS — E1129 Type 2 diabetes mellitus with other diabetic kidney complication: Secondary | ICD-10-CM | POA: Diagnosis not present

## 2018-11-21 DIAGNOSIS — I5023 Acute on chronic systolic (congestive) heart failure: Secondary | ICD-10-CM | POA: Diagnosis not present

## 2018-11-21 DIAGNOSIS — E1151 Type 2 diabetes mellitus with diabetic peripheral angiopathy without gangrene: Secondary | ICD-10-CM | POA: Diagnosis not present

## 2018-11-21 DIAGNOSIS — N2581 Secondary hyperparathyroidism of renal origin: Secondary | ICD-10-CM | POA: Diagnosis not present

## 2018-11-21 NOTE — Telephone Encounter (Signed)
Called to advise the trapeze bar that had been ordered had still not been delivered by therapist report because [employer] had not been able to get in touch with the pt. Advised that they can call [phone] to reinstate order and that they can get DME delivered to him.

## 2018-11-22 DIAGNOSIS — Z992 Dependence on renal dialysis: Secondary | ICD-10-CM | POA: Diagnosis not present

## 2018-11-22 DIAGNOSIS — N2581 Secondary hyperparathyroidism of renal origin: Secondary | ICD-10-CM | POA: Diagnosis not present

## 2018-11-22 DIAGNOSIS — Z23 Encounter for immunization: Secondary | ICD-10-CM | POA: Diagnosis not present

## 2018-11-22 DIAGNOSIS — E1129 Type 2 diabetes mellitus with other diabetic kidney complication: Secondary | ICD-10-CM | POA: Diagnosis not present

## 2018-11-22 DIAGNOSIS — N186 End stage renal disease: Secondary | ICD-10-CM | POA: Diagnosis not present

## 2018-11-24 DIAGNOSIS — Z992 Dependence on renal dialysis: Secondary | ICD-10-CM | POA: Diagnosis not present

## 2018-11-24 DIAGNOSIS — N2581 Secondary hyperparathyroidism of renal origin: Secondary | ICD-10-CM | POA: Diagnosis not present

## 2018-11-24 DIAGNOSIS — Z23 Encounter for immunization: Secondary | ICD-10-CM | POA: Diagnosis not present

## 2018-11-24 DIAGNOSIS — N186 End stage renal disease: Secondary | ICD-10-CM | POA: Diagnosis not present

## 2018-11-24 DIAGNOSIS — E1129 Type 2 diabetes mellitus with other diabetic kidney complication: Secondary | ICD-10-CM | POA: Diagnosis not present

## 2018-11-27 DIAGNOSIS — N2581 Secondary hyperparathyroidism of renal origin: Secondary | ICD-10-CM | POA: Diagnosis not present

## 2018-11-27 DIAGNOSIS — N186 End stage renal disease: Secondary | ICD-10-CM | POA: Diagnosis not present

## 2018-11-27 DIAGNOSIS — Z992 Dependence on renal dialysis: Secondary | ICD-10-CM | POA: Diagnosis not present

## 2018-11-27 DIAGNOSIS — Z23 Encounter for immunization: Secondary | ICD-10-CM | POA: Diagnosis not present

## 2018-11-27 DIAGNOSIS — E1129 Type 2 diabetes mellitus with other diabetic kidney complication: Secondary | ICD-10-CM | POA: Diagnosis not present

## 2018-11-29 DIAGNOSIS — N2581 Secondary hyperparathyroidism of renal origin: Secondary | ICD-10-CM | POA: Diagnosis not present

## 2018-11-29 DIAGNOSIS — N186 End stage renal disease: Secondary | ICD-10-CM | POA: Diagnosis not present

## 2018-11-29 DIAGNOSIS — Z23 Encounter for immunization: Secondary | ICD-10-CM | POA: Diagnosis not present

## 2018-11-29 DIAGNOSIS — Z992 Dependence on renal dialysis: Secondary | ICD-10-CM | POA: Diagnosis not present

## 2018-11-29 DIAGNOSIS — E1129 Type 2 diabetes mellitus with other diabetic kidney complication: Secondary | ICD-10-CM | POA: Diagnosis not present

## 2018-11-30 ENCOUNTER — Ambulatory Visit: Payer: Medicare Other | Admitting: Orthopedic Surgery

## 2018-12-01 DIAGNOSIS — N2581 Secondary hyperparathyroidism of renal origin: Secondary | ICD-10-CM | POA: Diagnosis not present

## 2018-12-01 DIAGNOSIS — Z89612 Acquired absence of left leg above knee: Secondary | ICD-10-CM | POA: Diagnosis not present

## 2018-12-01 DIAGNOSIS — I132 Hypertensive heart and chronic kidney disease with heart failure and with stage 5 chronic kidney disease, or end stage renal disease: Secondary | ICD-10-CM | POA: Diagnosis not present

## 2018-12-01 DIAGNOSIS — G4730 Sleep apnea, unspecified: Secondary | ICD-10-CM | POA: Diagnosis not present

## 2018-12-01 DIAGNOSIS — E1122 Type 2 diabetes mellitus with diabetic chronic kidney disease: Secondary | ICD-10-CM | POA: Diagnosis not present

## 2018-12-01 DIAGNOSIS — Z89611 Acquired absence of right leg above knee: Secondary | ICD-10-CM | POA: Diagnosis not present

## 2018-12-01 DIAGNOSIS — E1151 Type 2 diabetes mellitus with diabetic peripheral angiopathy without gangrene: Secondary | ICD-10-CM | POA: Diagnosis not present

## 2018-12-01 DIAGNOSIS — I5023 Acute on chronic systolic (congestive) heart failure: Secondary | ICD-10-CM | POA: Diagnosis not present

## 2018-12-01 DIAGNOSIS — H409 Unspecified glaucoma: Secondary | ICD-10-CM | POA: Diagnosis not present

## 2018-12-01 DIAGNOSIS — K3184 Gastroparesis: Secondary | ICD-10-CM | POA: Diagnosis not present

## 2018-12-01 DIAGNOSIS — D631 Anemia in chronic kidney disease: Secondary | ICD-10-CM | POA: Diagnosis not present

## 2018-12-01 DIAGNOSIS — Z23 Encounter for immunization: Secondary | ICD-10-CM | POA: Diagnosis not present

## 2018-12-01 DIAGNOSIS — E1143 Type 2 diabetes mellitus with diabetic autonomic (poly)neuropathy: Secondary | ICD-10-CM | POA: Diagnosis not present

## 2018-12-01 DIAGNOSIS — I872 Venous insufficiency (chronic) (peripheral): Secondary | ICD-10-CM | POA: Diagnosis not present

## 2018-12-01 DIAGNOSIS — N186 End stage renal disease: Secondary | ICD-10-CM | POA: Diagnosis not present

## 2018-12-01 DIAGNOSIS — E43 Unspecified severe protein-calorie malnutrition: Secondary | ICD-10-CM | POA: Diagnosis not present

## 2018-12-01 DIAGNOSIS — Z992 Dependence on renal dialysis: Secondary | ICD-10-CM | POA: Diagnosis not present

## 2018-12-01 DIAGNOSIS — E1129 Type 2 diabetes mellitus with other diabetic kidney complication: Secondary | ICD-10-CM | POA: Diagnosis not present

## 2018-12-01 DIAGNOSIS — Z794 Long term (current) use of insulin: Secondary | ICD-10-CM | POA: Diagnosis not present

## 2018-12-01 DIAGNOSIS — Z4781 Encounter for orthopedic aftercare following surgical amputation: Secondary | ICD-10-CM | POA: Diagnosis not present

## 2018-12-04 DIAGNOSIS — N2581 Secondary hyperparathyroidism of renal origin: Secondary | ICD-10-CM | POA: Diagnosis not present

## 2018-12-04 DIAGNOSIS — E1129 Type 2 diabetes mellitus with other diabetic kidney complication: Secondary | ICD-10-CM | POA: Diagnosis not present

## 2018-12-04 DIAGNOSIS — Z992 Dependence on renal dialysis: Secondary | ICD-10-CM | POA: Diagnosis not present

## 2018-12-04 DIAGNOSIS — Z23 Encounter for immunization: Secondary | ICD-10-CM | POA: Diagnosis not present

## 2018-12-04 DIAGNOSIS — N186 End stage renal disease: Secondary | ICD-10-CM | POA: Diagnosis not present

## 2018-12-05 ENCOUNTER — Ambulatory Visit: Payer: Medicare Other | Attending: Orthopedic Surgery | Admitting: Physical Therapy

## 2018-12-05 ENCOUNTER — Encounter: Payer: Self-pay | Admitting: Physical Therapy

## 2018-12-05 ENCOUNTER — Other Ambulatory Visit: Payer: Self-pay

## 2018-12-05 ENCOUNTER — Ambulatory Visit (INDEPENDENT_AMBULATORY_CARE_PROVIDER_SITE_OTHER): Payer: Medicare Other | Admitting: Family

## 2018-12-05 ENCOUNTER — Encounter: Payer: Self-pay | Admitting: Family

## 2018-12-05 VITALS — BP 146/81 | HR 93

## 2018-12-05 VITALS — Ht 72.0 in | Wt 215.0 lb

## 2018-12-05 DIAGNOSIS — M6281 Muscle weakness (generalized): Secondary | ICD-10-CM

## 2018-12-05 DIAGNOSIS — M25652 Stiffness of left hip, not elsewhere classified: Secondary | ICD-10-CM | POA: Diagnosis not present

## 2018-12-05 DIAGNOSIS — Z89611 Acquired absence of right leg above knee: Secondary | ICD-10-CM

## 2018-12-05 DIAGNOSIS — R293 Abnormal posture: Secondary | ICD-10-CM | POA: Diagnosis not present

## 2018-12-05 DIAGNOSIS — M25651 Stiffness of right hip, not elsewhere classified: Secondary | ICD-10-CM | POA: Diagnosis not present

## 2018-12-05 DIAGNOSIS — Z9181 History of falling: Secondary | ICD-10-CM | POA: Diagnosis not present

## 2018-12-05 DIAGNOSIS — I70262 Atherosclerosis of native arteries of extremities with gangrene, left leg: Secondary | ICD-10-CM

## 2018-12-05 DIAGNOSIS — Z89612 Acquired absence of left leg above knee: Secondary | ICD-10-CM

## 2018-12-05 DIAGNOSIS — S78111A Complete traumatic amputation at level between right hip and knee, initial encounter: Secondary | ICD-10-CM

## 2018-12-05 NOTE — Therapy (Signed)
[facility] [address] [place], [place], [address] Phone: [phone]   Fax:  [phone]  Physical Therapy Evaluation  Patient Details  Name: Howard Bell MRN: [id number] Date of Birth: 1966/02/06 Referring Provider (PT): Meridee Score, MD   Encounter Date: 12/05/2018   CLINIC OPERATION CHANGES: Outpatient Neuro Rehab is open at lower capacity following universal masking, social distancing, and patient screening.  The patient's COVID risk of complications score is 6.   PT End of Session - 12/05/18 1030    Visit Number  1    Number of Visits  1    PT Start Time  [id number]    PT Stop Time  0848    PT Time Calculation (min)  53 min    Equipment Utilized During Treatment  Oxygen;Gait belt    Activity Tolerance  Patient tolerated treatment well;Other (comment)   limited by oxygen saturation rate dropping & dyspnea   Behavior During Therapy  [facility] for tasks assessed/performed       Past Medical History:  Diagnosis Date  . Arthritis   . Congestive heart failure (CHF) ([place])   . Depression   . 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 Diabetic gastroparesis ([place]) 11/06/2017  . ESRD (end stage renal disease) on dialysis [facility])    M,W.F dialysis  . H/O seasonal allergies   . Hypertension   . Pneumonia   . Sleep apnea    not using it now, on continuous O2 2 L during the day, 3 L during the night    Past Surgical History:  Procedure Laterality Date  . AMPUTATION Right 05/14/2018   Procedure: AMPUTATION BELOW KNEE;  Surgeon: Newt Minion, MD;  Location: [facility];  Service: Orthopedics;  Laterality: Right;  . AMPUTATION Right 08/04/2018   Procedure: RIGHT ABOVE KNEE AMPUTATION;  Surgeon: Newt Minion, MD;  Location: [facility];  Service: Orthopedics;  Laterality: Right;  . AMPUTATION Left 09/08/2018   Procedure: LEFT ABOVE KNEE AMPUTATION;  Surgeon: Newt Minion, MD;  Location: [facility];  Service:  Orthopedics;  Laterality: Left;  . APPLICATION OF WOUND VAC Left 09/08/2018   Procedure: Application Of Wound Vac;  Surgeon: Newt Minion, MD;  Location: [facility];  Service: Orthopedics;  Laterality: Left;  . ESOPHAGOGASTRODUODENOSCOPY (EGD) WITH PROPOFOL N/A 12/05/2017   Procedure: ESOPHAGOGASTRODUODENOSCOPY (EGD) WITH PROPOFOL;  Surgeon: Doran Stabler, MD;  [facility];  Service: Gastroenterology;  Laterality: N/A;  . HERNIA REPAIR    . IR AV DIALY SHUNT INTRO NEEDLE/INTRACATH INITIAL W/PTA/IMG LEFT  03/30/2017    Vitals:   12/05/18 0810 12/05/18 0830 12/05/18 0840  BP: (!) 146/81    Pulse: 81 93 93  SpO2: 97% (!) 83% (!) 83%     Subjective Assessment - 12/05/18 0810    Subjective  This 53yo male was referred on 11/28/2018 by Meridee Score, MD with bilateral Transfemoral Amputations & dialysis. He underwent a right Transfemoral Amputation on 08/14/2018 & Left Transfemoral Amputation on 09/18/2018. He is on oxygen since surgery. Wife reports weaning oxygen. Inpatient rehab 09/16/2018 to 09/29/2018. Home Health ([facility] PT is on hold for now.    Pertinent History  Bil TFAs, DM2, CKD w/ESRD (10 years), neuropathy, retinopathy, OSA with chronic hypoxic respiratory failure,    Patient Stated Goals  Wants to get prostheses.    Currently in Pain?  Yes    Pain Score  0-No pain   in last week, worst  5/10   Pain Location  Leg    Pain Orientation  Left;Right    Pain Descriptors / Indicators  Stabbing    Pain Type  Phantom pain    Pain Onset  More than a month ago    Pain Frequency  Intermittent    Aggravating Factors   worse at night, unknown triggers    Pain Relieving Factors  massaging limbs         [facility] PT Assessment - 12/05/18 0800      Assessment   Medical Diagnosis  Bilateral Transfemoral Amputations    Referring Provider (PT)  Meridee Score, MD    Onset Date/Surgical Date  09/18/18    Hand Dominance  Right    Prior Therapy  inpatient rehab & [facility]      Precautions    Precautions  Fall    Precaution Comments  No BP LUE, Oxygen 24hrs/day 2L      Balance Screen   Has the patient fallen in the past 6 months  Yes    How many times?  1   up hill on scooter   Has the patient had a decrease in activity level because of a fear of falling?   Yes    Is the patient reluctant to leave their home because of a fear of falling?   Yes      [place] residence    Living Arrangements  Spouse/significant other;Children   15yo dtr lives at home,  31yo lives in area   Type of [place]  --   grass hill to go around 2 steps   [place] scooter;Wheelchair - [employer] - 2 wheels;Bedside commode;Tub bench   suction grab bar     Prior Function   Level of Independence  Independent;Independent with household mobility without device;Independent with community mobility without device   going to gym after dialysis   Vocation  On disability    Leisure  watch movies, gun range, travel      Posture/Postural Control   Posture/Postural Control  Postural limitations    Postural Limitations  Rounded Shoulders;Forward head;Increased lumbar lordosis    Posture Comments  Sitting posture without UE support      ROM / Strength   AROM / PROM / Strength  PROM;Strength      PROM   Overall PROM   Deficits    PROM Assessment Site  Hip    Left Hip Extension  -18*   supine Thomas position     Strength   Overall Strength  Deficits    Overall Strength Comments  Patient able to boost from w/c armrest 1 rep with elbows -30* extension.  20 rep dips from wheels of w/c with increased dyspnea & oxygen saturation 83% with 30 second recovery to 90%   Gross BUE MMT 5/5 except grip fair    Right Hip Flexion  4/5    Right Hip Extension  3-/5    Right Hip ABduction  3+/5    Left Hip Flexion  4/5    Left Hip Extension  3-/5    Left Hip ABduction  3+/5    Lumbar Flexion  3-/5    Lumbar  Extension  2+/5      Right Hip   Right Hip Extension  -12   supine Thomas hip position     Bed Mobility  Bed Mobility  Rolling Right;Rolling Left;Right Sidelying to Sit;Sit to Supine    Rolling Right  Independent with assistive device   pulls on edge of mat table   Rolling Left  Minimal Assistance - Patient > 75%   pulls on edge of mat table   Right Sidelying to Sit  Minimal Assistance - Patient > 75%   uses edge of mat table to assist   Sit to Supine  Independent with assistive device      Transfers   Transfers  Lateral/Scoot Transfers;Anterior-Posterior Training and development officer  5: Supervision;To level surface    Anterior-Posterior Transfer Details (indicate cue type and reason)  PT educated on increased safety vs side transfer with less risk of fall forward without feet to stabilize.  PT also educated on using this transfer for toileting & facing backwards on toilet. Pt & wife verbalized understanding.     Lateral/Scoot Transfers  5: Supervision;With slide board    Lateral/Scoot Transfer Details (indicate cue type and reason)  attempted initially level surface w/c to mat table but unable to lift over wheel of w/c; required use of sliding board.       Administrator, arts  Both upper extremities    Wheelchair Parts Management  Independent   modified independent (slow)   Distance  50'   HR 84 & SpO2 96%      Balance   Balance Assessed  Yes      Static Sitting Balance   Static Sitting - Balance Support  No upper extremity supported   Bil AKAs so no LE support, seated on firm mat table   Static Sitting - Level of Assistance  6: Modified independent (Device/Increase time)    Static Sitting - Comment/# of Minutes  2 minutes      Dynamic Sitting Balance   Dynamic Sitting - Balance Support  No upper extremity supported   bil. AKAs so no LE support, seated on firm mat table   Dynamic Sitting - Level of Assistance   5: Stand by assistance    Reach (Patient is able to reach ___ inches to right, left, forward, back)  7" anteriorly    Dynamic Sitting - Balance Activities  Lateral lean/weight shifting;Forward lean/weight shifting;Trunk control activities;Other (comment)   manual challenge / nudge test   Dynamic Sitting balance - Comments  lateral weight shift both right & left to elbow pushing back upright with hand;  rotates right & Left to look over shoulders;  nudge test anterior, posterior & lateral with stability.  anterior trunk lean with head 10" movement and posterior trunk lean with head 2" movement.  All mentioned activities with supervision.                  Objective measurements completed on examination: See above findings.                           Plan - 12/05/18 1208    Clinical Impression Statement  Patient does NOT appear to be a candidate for Transfemoral prostheses even Foreshortened (Stubbies) prostheses at this time. The greatest concern is oxygen dependent with desaturation with low intensity basic activities. He has significant medical issues indicating impaired circulation and the amount of exertion with bilateral Transfemoral prostheses could result in MI or CVA.  Patient also had weakness in bilateral hips and bilateral hip flexor contractures.  PT advised  patient & wife not to discontinue oxygen without MD guidance / orders. If he is able to wean off of oxygen and perform activities without desaturating, then another PT evaluation would be recommended at that time.    Personal Factors and Comorbidities  Comorbidity 3+;Fitness;Past/Current Experience    Comorbidities  Bil TFAs, DM2, CKD w/ESRD, neuropathy, retinopathy, OSA with chronic hypoxic respiratory failure    Examination-Activity Limitations  Bed Mobility;Reach Overhead;Toileting;Transfers    Stability/Clinical Decision Making  Evolving/Moderate complexity    Clinical Decision Making  Moderate     Rehab Potential  Good    PT Frequency  One time visit    PT Next Visit Plan  Evaluation only for prostheses.  Pt is NOT candidate at this time.    Consulted and Agree with Plan of Care  Patient;Family member/caregiver    Family Member Consulted  wife       Patient will benefit from skilled therapeutic intervention in order to improve the following deficits and impairments:  Abnormal gait, Cardiopulmonary status limiting activity, Decreased activity tolerance, Decreased balance, Decreased endurance, Decreased mobility, Decreased range of motion, Decreased strength, Impaired flexibility, Postural dysfunction, Pain  Visit Diagnosis: Stiffness of right hip, not elsewhere classified  Stiffness of left hip, not elsewhere classified  Muscle weakness (generalized)  Abnormal posture  History of falling     Problem List Patient Active Problem List   Diagnosis Date Noted  . S/P AKA (above knee amputation) bilateral ([place]) 11/07/2018  . Chronic hypercapnic respiratory failure ([place]) 10/02/2018  . OSA (obstructive sleep apnea)   . S/P AKA (above knee amputation) unilateral, right ([place])   . Diabetic peripheral neuropathy ([place])   . Status post bilateral above knee amputation ([place]) 09/08/2018  . Atherosclerosis of native arteries of extremities with gangrene, left leg ([place]) 09/05/2018  . Peripheral arterial disease ([place])   . Unilateral AKA, right ([place]) 08/17/2018  . Drug induced constipation   . Uncontrolled diabetes mellitus type 2 with peripheral artery disease ([place])   . Anemia of chronic disease   . Diabetes mellitus type 2 in nonobese (HCC)   . Supplemental oxygen dependent   . Unilateral AKA, left ([place]) 08/07/2018  . S/P BKA (below knee amputation), right ([place]) 08/04/2018  . Critical limb ischemia with history of revascularization of same extremity 05/09/2018  . 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 Hypertensive heart disease with  acute on chronic systolic congestive heart failure ([place]) 11/16/2017  . CKD stage 5 due to type 2 diabetes mellitus ([place]) 11/16/2017  . Glaucoma due to type 2 diabetes mellitus ([place]) 11/16/2017  . Chronic generalized pain 11/16/2017  . Recurrent pneumonia 11/06/2017  . Diabetic gastroparesis ([place]) 11/06/2017  . Generalized abdominal pain 09/13/2017  . ESRD on dialysis ([place]) 03/28/2017  . Anemia due to end stage renal disease ([place]) 08/04/2016    Bilbo Carcamo PT, DPT 12/05/2018, 12:15 PM  [facility] [address] [place], [place], [address] Phone: [phone]   Fax:  [phone]  Name: Howard Bell MRN: [id number] Date of Birth: [birth year]

## 2018-12-05 NOTE — Progress Notes (Signed)
Office Visit Note   Patient: Howard Bell           Date of Birth: April 20, 1965           MRN: [id number] Visit Date: 12/05/2018              Requested by: Horald Pollen, MD [place],  [place] [id number] PCP: Horald Pollen, MD  Chief Complaint  Patient presents with  . Right Leg - Follow-up    08/04/18 right AKA  . Left Leg - Routine Post Op    09/08/18 left AKA      HPI: Patient is a 53 year old gentleman who presents follow-up status post bilateral above-knee amputations.  Patient is currently working with biotech. he complains of a scab over the lateral side of the right above-the-knee amputation but otherwise states he is healing well he is wearing stump shrinker's bilaterally.  Has been told needs to get stronger and decrease use of home o2 prior to getting prosthesis. Assessment & Plan: Visit Diagnoses:  No diagnosis found.  Plan: Patient will follow-up with biotech for prosthetic fitting as able. Follow up in office in 2 months.   Follow-Up Instructions: No follow-ups on file.   Ortho Exam  Patient is alert, oriented, no adenopathy, well-dressed, normal affect, normal respiratory effort. On examination the incisions are healing well. The left aka is well healed. The right aka has a 20 x 5 mm area of eschar. This is debrided. Underlying ulcer. No depth. there is no redness no cellulitis no drainage no signs of infection.  Imaging: No results found.    Labs: Lab Results  Component Value Date   HGBA1C 5.6 11/14/2018   HGBA1C 6.4 (H) 09/10/2018   HGBA1C 7.5 (H) 08/17/2018   ESRSEDRATE >140 (H) 08/15/2018   CRP 20.5 (H) 08/15/2018   REPTSTATUS 08/20/2018 FINAL 08/15/2018   CULT  08/15/2018    NO GROWTH 5 DAYS Performed at [facility], [place] [address], [place], [place] [address]    LABORGA PROTEUS MIRABILIS 05/12/2018     Lab Results  Component Value Date   ALBUMIN 2.6 (L) 09/29/2018   ALBUMIN 2.5 (L) 09/27/2018   ALBUMIN 2.4 (L) 09/25/2018   PREALBUMIN 14.6 (L) 08/15/2018    Lab Results  Component Value Date   MG 2.1 09/08/2018   MG 2.0 08/17/2018   MG 2.8 (H) 03/30/2017   No results found for: [facility]  Lab Results  Component Value Date   PREALBUMIN 14.6 (L) 08/15/2018   CBC EXTENDED Latest Ref Rng & Units 09/29/2018 09/27/2018 09/25/2018  WBC 4.0 - 10.5 K/uL 6.3 7.6 7.0  RBC 4.22 - 5.81 MIL/uL 2.75(L) 2.62(L) 2.46(L)  HGB 13.0 - 17.0 g/dL 8.5(L) 8.4(L) 7.3(L)  HCT 39.0 - 52.0 % 27.3(L) 26.0(L) 24.3(L)  PLT 150 - 400 K/uL 208 207 233  NEUTROABS 1.7 - 7.7 K/uL - - -  LYMPHSABS 0.7 - 4.0 K/uL - - -     Body mass index is 29.16 kg/m.  Orders:  No orders of the defined types were placed in this encounter.  No orders of the defined types were placed in this encounter.    Procedures: No procedures performed  Clinical Data: No additional findings.  ROS:  All other systems negative, except as noted in the HPI. Review of Systems  Constitutional: Negative for chills and fever.  Skin: Positive for wound.    Objective: Vital Signs: Ht 6' (1.829 m)   Wt 215 lb (97.5 kg)  BMI 29.16 kg/m   Specialty Comments:  No specialty comments available.  PMFS History: Patient Active Problem List   Diagnosis Date Noted  . S/P AKA (above knee amputation) bilateral ([place]) 11/07/2018  . Chronic hypercapnic respiratory failure ([place]) 10/02/2018  . OSA (obstructive sleep apnea)   . S/P AKA (above knee amputation) unilateral, right ([place])   . Diabetic peripheral neuropathy ([place])   . Status post bilateral above knee amputation ([place]) 09/08/2018  . Atherosclerosis of native arteries of extremities with gangrene, left leg ([place]) 09/05/2018  . Peripheral arterial disease ([place])   . Unilateral AKA, right ([place]) 08/17/2018  . Drug induced constipation   . Uncontrolled diabetes mellitus type 2 with peripheral artery disease ([place])   . Anemia of chronic disease   . Diabetes mellitus type 2 in nonobese  (H[facility])   . Supplemental oxygen dependent   . Unilateral AKA, left ([place]) 08/07/2018  . S/P BKA (below knee amputation), right ([place]) 08/04/2018  . Critical limb ischemia with history of revascularization of same extremity 05/09/2018  . 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 ([place]) 11/16/2017  . CKD stage 5 due to type 2 diabetes mellitus ([place]) 11/16/2017  . Glaucoma due to type 2 diabetes mellitus ([place]) 11/16/2017  . Chronic generalized pain 11/16/2017  . Recurrent pneumonia 11/06/2017  . Diabetic gastroparesis ([place]) 11/06/2017  . Generalized abdominal pain 09/13/2017  . ESRD on dialysis ([place]) 03/28/2017  . Anemia due to end stage renal disease ([place]) 08/04/2016   Past Medical History:  Diagnosis Date  . Arthritis   . Congestive heart failure (CHF) ([place])   . Depression   . 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 Diabetic gastroparesis ([place]) 11/06/2017  . ESRD (end stage renal disease) on dialysis [facility])    M,W.F dialysis  . H/O seasonal allergies   . Hypertension   . Pneumonia   . Sleep apnea    not using it now, on continuous O2 2 L during the day, 3 L during the night    Family History  Problem Relation Age of Onset  . Diabetes Mother   . Hypertension Father     Past Surgical History:  Procedure Laterality Date  . AMPUTATION Right 05/14/2018   Procedure: AMPUTATION BELOW KNEE;  Surgeon: Newt Minion, MD;  Location: [place];  Service: Orthopedics;  Laterality: Right;  . AMPUTATION Right 08/04/2018   Procedure: RIGHT ABOVE KNEE AMPUTATION;  Surgeon: Newt Minion, MD;  Location: [place];  Service: Orthopedics;  Laterality: Right;  . AMPUTATION Left 09/08/2018   Procedure: LEFT ABOVE KNEE AMPUTATION;  Surgeon: Newt Minion, MD;  Location: [place];  Service: Orthopedics;  Laterality: Left;  . APPLICATION OF WOUND VAC Left  09/08/2018   Procedure: Application Of Wound Vac;  Surgeon: Newt Minion, MD;  Location: [place];  Service: Orthopedics;  Laterality: Left;  . ESOPHAGOGASTRODUODENOSCOPY (EGD) WITH PROPOFOL N/A 12/05/2017   Procedure: ESOPHAGOGASTRODUODENOSCOPY (EGD) WITH PROPOFOL;  Surgeon: Doran Stabler, MD;  Location: [facility];  Service: Gastroenterology;  Laterality: N/A;  . HERNIA REPAIR    . IR AV DIALY SHUNT INTRO NEEDLE/INTRACATH INITIAL W/PTA/IMG LEFT  03/30/2017   Social History   Occupational History  . Not on file  Tobacco Use  . Smoking status: Never Smoker  . Smokeless tobacco: Never Used  Substance and Sexual Activity  .  Alcohol use: No  . Drug use: No  . Sexual activity: Not on file

## 2018-12-06 DIAGNOSIS — N2581 Secondary hyperparathyroidism of renal origin: Secondary | ICD-10-CM | POA: Diagnosis not present

## 2018-12-06 DIAGNOSIS — Z23 Encounter for immunization: Secondary | ICD-10-CM | POA: Diagnosis not present

## 2018-12-06 DIAGNOSIS — E1129 Type 2 diabetes mellitus with other diabetic kidney complication: Secondary | ICD-10-CM | POA: Diagnosis not present

## 2018-12-06 DIAGNOSIS — N186 End stage renal disease: Secondary | ICD-10-CM | POA: Diagnosis not present

## 2018-12-06 DIAGNOSIS — Z992 Dependence on renal dialysis: Secondary | ICD-10-CM | POA: Diagnosis not present

## 2018-12-07 ENCOUNTER — Telehealth: Payer: Self-pay | Admitting: Orthopedic Surgery

## 2018-12-07 DIAGNOSIS — E1122 Type 2 diabetes mellitus with diabetic chronic kidney disease: Secondary | ICD-10-CM | POA: Diagnosis not present

## 2018-12-07 DIAGNOSIS — I5023 Acute on chronic systolic (congestive) heart failure: Secondary | ICD-10-CM | POA: Diagnosis not present

## 2018-12-07 DIAGNOSIS — E1151 Type 2 diabetes mellitus with diabetic peripheral angiopathy without gangrene: Secondary | ICD-10-CM | POA: Diagnosis not present

## 2018-12-07 DIAGNOSIS — I872 Venous insufficiency (chronic) (peripheral): Secondary | ICD-10-CM | POA: Diagnosis not present

## 2018-12-07 DIAGNOSIS — Z4781 Encounter for orthopedic aftercare following surgical amputation: Secondary | ICD-10-CM | POA: Diagnosis not present

## 2018-12-07 DIAGNOSIS — I132 Hypertensive heart and chronic kidney disease with heart failure and with stage 5 chronic kidney disease, or end stage renal disease: Secondary | ICD-10-CM | POA: Diagnosis not present

## 2018-12-07 NOTE — Telephone Encounter (Signed)
Patient was called and spoke to wife and she states patient is fine w/no brusing or bleeding. Patient stated he was sore w/ no skin tears. Patient fell off bed trying to reach an phone cord. Patient wife states patient did not want to go to ED or make an appt for clinic.

## 2018-12-07 NOTE — Telephone Encounter (Signed)
Received voicemail message from Howard Bell with [facility] advised patient fell off the bed reaching for something. Patient is a little sore on both legs, no injuries. Patient is complaining of back pain. Patient BP is 154/90. The number to contact Liliane Channel is [phone]

## 2018-12-08 DIAGNOSIS — N186 End stage renal disease: Secondary | ICD-10-CM | POA: Diagnosis not present

## 2018-12-08 DIAGNOSIS — Z992 Dependence on renal dialysis: Secondary | ICD-10-CM | POA: Diagnosis not present

## 2018-12-08 DIAGNOSIS — Z23 Encounter for immunization: Secondary | ICD-10-CM | POA: Diagnosis not present

## 2018-12-08 DIAGNOSIS — E1129 Type 2 diabetes mellitus with other diabetic kidney complication: Secondary | ICD-10-CM | POA: Diagnosis not present

## 2018-12-08 DIAGNOSIS — N2581 Secondary hyperparathyroidism of renal origin: Secondary | ICD-10-CM | POA: Diagnosis not present

## 2018-12-11 DIAGNOSIS — Z23 Encounter for immunization: Secondary | ICD-10-CM | POA: Diagnosis not present

## 2018-12-11 DIAGNOSIS — E1129 Type 2 diabetes mellitus with other diabetic kidney complication: Secondary | ICD-10-CM | POA: Diagnosis not present

## 2018-12-11 DIAGNOSIS — Z992 Dependence on renal dialysis: Secondary | ICD-10-CM | POA: Diagnosis not present

## 2018-12-11 DIAGNOSIS — N186 End stage renal disease: Secondary | ICD-10-CM | POA: Diagnosis not present

## 2018-12-11 DIAGNOSIS — N2581 Secondary hyperparathyroidism of renal origin: Secondary | ICD-10-CM | POA: Diagnosis not present

## 2018-12-13 DIAGNOSIS — N186 End stage renal disease: Secondary | ICD-10-CM | POA: Diagnosis not present

## 2018-12-13 DIAGNOSIS — N2581 Secondary hyperparathyroidism of renal origin: Secondary | ICD-10-CM | POA: Diagnosis not present

## 2018-12-13 DIAGNOSIS — E1129 Type 2 diabetes mellitus with other diabetic kidney complication: Secondary | ICD-10-CM | POA: Diagnosis not present

## 2018-12-13 DIAGNOSIS — Z23 Encounter for immunization: Secondary | ICD-10-CM | POA: Diagnosis not present

## 2018-12-13 DIAGNOSIS — Z992 Dependence on renal dialysis: Secondary | ICD-10-CM | POA: Diagnosis not present

## 2018-12-15 DIAGNOSIS — Z23 Encounter for immunization: Secondary | ICD-10-CM | POA: Diagnosis not present

## 2018-12-15 DIAGNOSIS — Z992 Dependence on renal dialysis: Secondary | ICD-10-CM | POA: Diagnosis not present

## 2018-12-15 DIAGNOSIS — N186 End stage renal disease: Secondary | ICD-10-CM | POA: Diagnosis not present

## 2018-12-15 DIAGNOSIS — N2581 Secondary hyperparathyroidism of renal origin: Secondary | ICD-10-CM | POA: Diagnosis not present

## 2018-12-15 DIAGNOSIS — E1129 Type 2 diabetes mellitus with other diabetic kidney complication: Secondary | ICD-10-CM | POA: Diagnosis not present

## 2018-12-18 ENCOUNTER — Telehealth: Payer: Self-pay | Admitting: *Deleted

## 2018-12-18 DIAGNOSIS — E1129 Type 2 diabetes mellitus with other diabetic kidney complication: Secondary | ICD-10-CM | POA: Diagnosis not present

## 2018-12-18 DIAGNOSIS — Z23 Encounter for immunization: Secondary | ICD-10-CM | POA: Diagnosis not present

## 2018-12-18 DIAGNOSIS — N186 End stage renal disease: Secondary | ICD-10-CM | POA: Diagnosis not present

## 2018-12-18 DIAGNOSIS — Z992 Dependence on renal dialysis: Secondary | ICD-10-CM | POA: Diagnosis not present

## 2018-12-18 DIAGNOSIS — N2581 Secondary hyperparathyroidism of renal origin: Secondary | ICD-10-CM | POA: Diagnosis not present

## 2018-12-18 NOTE — Telephone Encounter (Signed)
Faxed Rx request for diabetes testing supplies to [facility]. Confirmation page received 9:01 am.

## 2018-12-19 DIAGNOSIS — E1151 Type 2 diabetes mellitus with diabetic peripheral angiopathy without gangrene: Secondary | ICD-10-CM | POA: Diagnosis not present

## 2018-12-19 DIAGNOSIS — I872 Venous insufficiency (chronic) (peripheral): Secondary | ICD-10-CM | POA: Diagnosis not present

## 2018-12-19 DIAGNOSIS — E1122 Type 2 diabetes mellitus with diabetic chronic kidney disease: Secondary | ICD-10-CM | POA: Diagnosis not present

## 2018-12-19 DIAGNOSIS — Z4781 Encounter for orthopedic aftercare following surgical amputation: Secondary | ICD-10-CM | POA: Diagnosis not present

## 2018-12-19 DIAGNOSIS — I132 Hypertensive heart and chronic kidney disease with heart failure and with stage 5 chronic kidney disease, or end stage renal disease: Secondary | ICD-10-CM | POA: Diagnosis not present

## 2018-12-19 DIAGNOSIS — I5023 Acute on chronic systolic (congestive) heart failure: Secondary | ICD-10-CM | POA: Diagnosis not present

## 2018-12-20 DIAGNOSIS — Z23 Encounter for immunization: Secondary | ICD-10-CM | POA: Diagnosis not present

## 2018-12-20 DIAGNOSIS — Z992 Dependence on renal dialysis: Secondary | ICD-10-CM | POA: Diagnosis not present

## 2018-12-20 DIAGNOSIS — N2581 Secondary hyperparathyroidism of renal origin: Secondary | ICD-10-CM | POA: Diagnosis not present

## 2018-12-20 DIAGNOSIS — N186 End stage renal disease: Secondary | ICD-10-CM | POA: Diagnosis not present

## 2018-12-20 DIAGNOSIS — E1129 Type 2 diabetes mellitus with other diabetic kidney complication: Secondary | ICD-10-CM | POA: Diagnosis not present

## 2018-12-21 DIAGNOSIS — Z992 Dependence on renal dialysis: Secondary | ICD-10-CM | POA: Diagnosis not present

## 2018-12-21 DIAGNOSIS — N186 End stage renal disease: Secondary | ICD-10-CM | POA: Diagnosis not present

## 2018-12-21 DIAGNOSIS — E1129 Type 2 diabetes mellitus with other diabetic kidney complication: Secondary | ICD-10-CM | POA: Diagnosis not present

## 2018-12-22 DIAGNOSIS — E1129 Type 2 diabetes mellitus with other diabetic kidney complication: Secondary | ICD-10-CM | POA: Diagnosis not present

## 2018-12-22 DIAGNOSIS — Z992 Dependence on renal dialysis: Secondary | ICD-10-CM | POA: Diagnosis not present

## 2018-12-22 DIAGNOSIS — N186 End stage renal disease: Secondary | ICD-10-CM | POA: Diagnosis not present

## 2018-12-22 DIAGNOSIS — N2581 Secondary hyperparathyroidism of renal origin: Secondary | ICD-10-CM | POA: Diagnosis not present

## 2018-12-25 DIAGNOSIS — N2581 Secondary hyperparathyroidism of renal origin: Secondary | ICD-10-CM | POA: Diagnosis not present

## 2018-12-25 DIAGNOSIS — E1129 Type 2 diabetes mellitus with other diabetic kidney complication: Secondary | ICD-10-CM | POA: Diagnosis not present

## 2018-12-25 DIAGNOSIS — Z992 Dependence on renal dialysis: Secondary | ICD-10-CM | POA: Diagnosis not present

## 2018-12-25 DIAGNOSIS — N186 End stage renal disease: Secondary | ICD-10-CM | POA: Diagnosis not present

## 2018-12-27 DIAGNOSIS — N186 End stage renal disease: Secondary | ICD-10-CM | POA: Diagnosis not present

## 2018-12-27 DIAGNOSIS — N2581 Secondary hyperparathyroidism of renal origin: Secondary | ICD-10-CM | POA: Diagnosis not present

## 2018-12-27 DIAGNOSIS — E1129 Type 2 diabetes mellitus with other diabetic kidney complication: Secondary | ICD-10-CM | POA: Diagnosis not present

## 2018-12-27 DIAGNOSIS — Z992 Dependence on renal dialysis: Secondary | ICD-10-CM | POA: Diagnosis not present

## 2018-12-28 ENCOUNTER — Telehealth: Payer: Self-pay | Admitting: Emergency Medicine

## 2018-12-28 ENCOUNTER — Telehealth: Payer: Self-pay | Admitting: Orthopedic Surgery

## 2018-12-28 DIAGNOSIS — I872 Venous insufficiency (chronic) (peripheral): Secondary | ICD-10-CM | POA: Diagnosis not present

## 2018-12-28 DIAGNOSIS — E1122 Type 2 diabetes mellitus with diabetic chronic kidney disease: Secondary | ICD-10-CM | POA: Diagnosis not present

## 2018-12-28 DIAGNOSIS — Z4781 Encounter for orthopedic aftercare following surgical amputation: Secondary | ICD-10-CM | POA: Diagnosis not present

## 2018-12-28 DIAGNOSIS — E1151 Type 2 diabetes mellitus with diabetic peripheral angiopathy without gangrene: Secondary | ICD-10-CM | POA: Diagnosis not present

## 2018-12-28 DIAGNOSIS — I132 Hypertensive heart and chronic kidney disease with heart failure and with stage 5 chronic kidney disease, or end stage renal disease: Secondary | ICD-10-CM | POA: Diagnosis not present

## 2018-12-28 DIAGNOSIS — I5023 Acute on chronic systolic (congestive) heart failure: Secondary | ICD-10-CM | POA: Diagnosis not present

## 2018-12-28 NOTE — Telephone Encounter (Signed)
Fax was received and placed in provider's box for corrections

## 2018-12-28 NOTE — Telephone Encounter (Signed)
Clair Gulling, patient's OT, called. He would like to recert him for OT and would like a PT order for him. He also says that the company will not send a prosthetic leg until patient is off of oxygen. Also would like for you to know that he has not received the trapeze bar. Jim's call back number is [phone]

## 2018-12-28 NOTE — Telephone Encounter (Signed)
Howard Bell is calling from [employer] regarding a CMN for oxygen. The data had misinformation when we faxed form back. This form has to have correct info before they can submit the claim. Please call Howard Bell if any further questions at [phone].

## 2018-12-29 DIAGNOSIS — E1129 Type 2 diabetes mellitus with other diabetic kidney complication: Secondary | ICD-10-CM | POA: Diagnosis not present

## 2018-12-29 DIAGNOSIS — N2581 Secondary hyperparathyroidism of renal origin: Secondary | ICD-10-CM | POA: Diagnosis not present

## 2018-12-29 DIAGNOSIS — N186 End stage renal disease: Secondary | ICD-10-CM | POA: Diagnosis not present

## 2018-12-29 DIAGNOSIS — Z992 Dependence on renal dialysis: Secondary | ICD-10-CM | POA: Diagnosis not present

## 2018-12-29 NOTE — Telephone Encounter (Signed)
Orders were signed and faxed to [facility] for Trapeze bar and Clair Gulling for Pt/OT orders.

## 2018-12-29 NOTE — Telephone Encounter (Signed)
Will hold this message for Dr Sharol Given to sign orders to fax on Monday.

## 2018-12-31 DIAGNOSIS — N186 End stage renal disease: Secondary | ICD-10-CM | POA: Diagnosis not present

## 2018-12-31 DIAGNOSIS — Z4781 Encounter for orthopedic aftercare following surgical amputation: Secondary | ICD-10-CM | POA: Diagnosis not present

## 2018-12-31 DIAGNOSIS — I132 Hypertensive heart and chronic kidney disease with heart failure and with stage 5 chronic kidney disease, or end stage renal disease: Secondary | ICD-10-CM | POA: Diagnosis not present

## 2018-12-31 DIAGNOSIS — I872 Venous insufficiency (chronic) (peripheral): Secondary | ICD-10-CM | POA: Diagnosis not present

## 2018-12-31 DIAGNOSIS — D631 Anemia in chronic kidney disease: Secondary | ICD-10-CM | POA: Diagnosis not present

## 2018-12-31 DIAGNOSIS — G4730 Sleep apnea, unspecified: Secondary | ICD-10-CM | POA: Diagnosis not present

## 2018-12-31 DIAGNOSIS — I5023 Acute on chronic systolic (congestive) heart failure: Secondary | ICD-10-CM | POA: Diagnosis not present

## 2018-12-31 DIAGNOSIS — K3184 Gastroparesis: Secondary | ICD-10-CM | POA: Diagnosis not present

## 2018-12-31 DIAGNOSIS — E43 Unspecified severe protein-calorie malnutrition: Secondary | ICD-10-CM | POA: Diagnosis not present

## 2018-12-31 DIAGNOSIS — Z992 Dependence on renal dialysis: Secondary | ICD-10-CM | POA: Diagnosis not present

## 2018-12-31 DIAGNOSIS — Z89611 Acquired absence of right leg above knee: Secondary | ICD-10-CM | POA: Diagnosis not present

## 2018-12-31 DIAGNOSIS — H409 Unspecified glaucoma: Secondary | ICD-10-CM | POA: Diagnosis not present

## 2018-12-31 DIAGNOSIS — E1151 Type 2 diabetes mellitus with diabetic peripheral angiopathy without gangrene: Secondary | ICD-10-CM | POA: Diagnosis not present

## 2018-12-31 DIAGNOSIS — Z794 Long term (current) use of insulin: Secondary | ICD-10-CM | POA: Diagnosis not present

## 2018-12-31 DIAGNOSIS — E1143 Type 2 diabetes mellitus with diabetic autonomic (poly)neuropathy: Secondary | ICD-10-CM | POA: Diagnosis not present

## 2018-12-31 DIAGNOSIS — E1122 Type 2 diabetes mellitus with diabetic chronic kidney disease: Secondary | ICD-10-CM | POA: Diagnosis not present

## 2018-12-31 DIAGNOSIS — Z89612 Acquired absence of left leg above knee: Secondary | ICD-10-CM | POA: Diagnosis not present

## 2019-01-01 DIAGNOSIS — E1129 Type 2 diabetes mellitus with other diabetic kidney complication: Secondary | ICD-10-CM | POA: Diagnosis not present

## 2019-01-01 DIAGNOSIS — Z992 Dependence on renal dialysis: Secondary | ICD-10-CM | POA: Diagnosis not present

## 2019-01-01 DIAGNOSIS — N186 End stage renal disease: Secondary | ICD-10-CM | POA: Diagnosis not present

## 2019-01-01 DIAGNOSIS — N2581 Secondary hyperparathyroidism of renal origin: Secondary | ICD-10-CM | POA: Diagnosis not present

## 2019-01-02 ENCOUNTER — Ambulatory Visit (INDEPENDENT_AMBULATORY_CARE_PROVIDER_SITE_OTHER): Payer: Medicare Other | Admitting: Emergency Medicine

## 2019-01-02 ENCOUNTER — Encounter: Payer: Self-pay | Admitting: Emergency Medicine

## 2019-01-02 ENCOUNTER — Other Ambulatory Visit: Payer: Self-pay

## 2019-01-02 VITALS — BP 148/85 | HR 98 | Temp 99.0°F | Resp 16

## 2019-01-02 DIAGNOSIS — G4733 Obstructive sleep apnea (adult) (pediatric): Secondary | ICD-10-CM

## 2019-01-02 DIAGNOSIS — J9612 Chronic respiratory failure with hypercapnia: Secondary | ICD-10-CM | POA: Diagnosis not present

## 2019-01-02 DIAGNOSIS — I739 Peripheral vascular disease, unspecified: Secondary | ICD-10-CM

## 2019-01-02 DIAGNOSIS — I70262 Atherosclerosis of native arteries of extremities with gangrene, left leg: Secondary | ICD-10-CM | POA: Diagnosis not present

## 2019-01-02 DIAGNOSIS — Z89612 Acquired absence of left leg above knee: Secondary | ICD-10-CM

## 2019-01-02 DIAGNOSIS — E118 Type 2 diabetes mellitus with unspecified complications: Secondary | ICD-10-CM | POA: Diagnosis not present

## 2019-01-02 DIAGNOSIS — Z89611 Acquired absence of right leg above knee: Secondary | ICD-10-CM

## 2019-01-02 DIAGNOSIS — I110 Hypertensive heart disease with heart failure: Secondary | ICD-10-CM

## 2019-01-02 DIAGNOSIS — M7551 Bursitis of right shoulder: Secondary | ICD-10-CM

## 2019-01-02 DIAGNOSIS — I5023 Acute on chronic systolic (congestive) heart failure: Secondary | ICD-10-CM

## 2019-01-02 NOTE — Progress Notes (Signed)
Henery Bretl 53 y.o.   Chief Complaint  Patient presents with  . Referral    Pulmonology    HISTORY OF PRESENT ILLNESS: This is a 53 y.o. male needs pulmonary referral.  Patient has multiple chronic medical problems with complications including chronic hypercapnic respiratory failure.  O2 dependent.  Also has a history of sleep apnea.  Diabetic with peripheral vascular disease status post bilateral AKA's.  Also has a history of hypertensive heart disease with heart failure.  Daughter here today with patient.  She states that patient's O2 sat quickly deteriorates with mild exertion.  Patient is on schedule for bilateral leg prosthesis.  However O2 sat deterioration despite being on oxygen 24/7 is a concern.  HPI   Prior to Admission medications   Medication Sig Start Date End Date Taking? Authorizing Provider  albuterol (PROVENTIL HFA;VENTOLIN HFA) 108 (90 Base) MCG/ACT inhaler Inhale 1 puff into the lungs every 4 (four) hours as needed for wheezing or shortness of breath.    Yes [provider]  amLODipine (NORVASC) 10 MG tablet Take 1 tablet (10 mg total) by mouth daily. 09/30/18  Yes Love, Ivan Anchors, PA-C  brimonidine (ALPHAGAN) 0.2 % ophthalmic solution Place 1 drop into both eyes 2 (two) times daily.   Yes [provider]  docusate sodium (COLACE) 100 MG capsule Take 1 capsule (100 mg total) by mouth 2 (two) times daily. 09/29/18  Yes Love, Ivan Anchors, PA-C  ferric citrate (AURYXIA) 1 GM 210 MG(Fe) tablet Take 1 tablet (210 mg total) by mouth 3 (three) times daily with meals. 09/29/18  Yes Love, Ivan Anchors, PA-C  fluticasone (FLONASE) 50 MCG/ACT nasal spray Place 1 spray into both nostrils daily.   Yes [provider]  insulin lispro (ADMELOG SOLOSTAR) 100 UNIT/ML KwikPen Inject 1-9 Units into the skin 3 (three) times daily. If bs is 121-150=1 unit, 151-200=2 units, 201-250=3 units, 251-300=5 units, 301-350=7 units, 351-400=9 units   Yes [provider]   latanoprost (XALATAN) 0.005 % ophthalmic solution Place 1 drop into both eyes at bedtime.   Yes [provider]  metoCLOPramide (REGLAN) 5 MG tablet Take 1 tablet (5 mg total) by mouth 3 (three) times daily before meals. 08/30/18  Yes Love, Ivan Anchors, PA-C  metoprolol succinate (TOPROL-XL) 50 MG 24 hr tablet Take 1 tablet (50 mg total) by mouth at bedtime. Take with a meal. 10/31/18 01/29/19 Yes Emylie Amster, Ines Bloomer, MD  multivitamin (RENA-VIT) TABS tablet Take 1 tablet by mouth at bedtime. 09/29/18  Yes Love, Ivan Anchors, PA-C  omeprazole (PRILOSEC) 20 MG capsule Take 1 capsule (20 mg total) by mouth at bedtime. 10/31/18  Yes Jillian Warth, Ines Bloomer, MD  oxyCODONE (OXY IR/ROXICODONE) 5 MG immediate release tablet Take 1-2 tablets (5-10 mg total) by mouth every 8 (eight) hours as needed for severe pain. 09/29/18  Yes Love, Ivan Anchors, PA-C  timolol (BETIMOL) 0.5 % ophthalmic solution Place 1 drop into both eyes 2 (two) times daily. 05/07/17  Yes English, Colletta [place] D, PA  traMADol (ULTRAM) 50 MG tablet Take 1 tablet (50 mg total) by mouth every 12 (twelve) hours as needed for moderate pain. 09/29/18  Yes Love, Ivan Anchors, PA-C  acetaminophen (TYLENOL) 325 MG tablet Take 1-2 tablets (325-650 mg total) by mouth every 4 (four) hours as needed for mild pain. Patient not taking: Reported on 01/02/2019 09/26/18   Bary Leriche, PA-C  glucose blood test strip 1 each by Other route as needed for other. Use as instructed 10/31/18  Horald Pollen, MD  Lancets (ACCU-CHEK SOFT TOUCH) lancets Use as instructed 10/31/18   Horald Pollen, MD    Allergies  Allergen Reactions  . Gabapentin Other (See Comments)    Altered mental status    Patient Active Problem List   Diagnosis Date Noted  . S/P AKA (above knee amputation) bilateral ([place]) 11/07/2018  . Chronic hypercapnic respiratory failure ([place]) 10/02/2018  . OSA (obstructive sleep apnea)   . S/P AKA (above knee amputation) unilateral, right ([place])   .  Diabetic peripheral neuropathy ([place])   . Status post bilateral above knee amputation ([place]) 09/08/2018  . Atherosclerosis of native arteries of extremities with gangrene, left leg ([place]) 09/05/2018  . Peripheral arterial disease ([place])   . Unilateral AKA, right ([place]) 08/17/2018  . Drug induced constipation   . Uncontrolled diabetes mellitus type 2 with peripheral artery disease ([place])   . Anemia of chronic disease   . Diabetes mellitus type 2 in nonobese (HCC)   . Supplemental oxygen dependent   . Unilateral AKA, left ([place]) 08/07/2018  . S/P BKA (below knee amputation), right ([place]) 08/04/2018  . Critical limb ischemia with history of revascularization of same extremity 05/09/2018  . 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 ([place]) 11/16/2017  . 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 ([place]) 11/16/2017  . CKD stage 5 due to type 2 diabetes mellitus ([place]) 11/16/2017  . Glaucoma due to type 2 diabetes mellitus ([place]) 11/16/2017  . Chronic generalized pain 11/16/2017  . Recurrent pneumonia 11/06/2017  . Diabetic gastroparesis ([place]) 11/06/2017  . Generalized abdominal pain 09/13/2017  . ESRD on dialysis ([place]) 03/28/2017  . Anemia due to end stage renal disease ([place]) 08/04/2016    Past Medical History:  Diagnosis Date  . Arthritis   . Congestive heart failure (CHF) ([place])   . Depression   . 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 Diabetic gastroparesis ([place]) 11/06/2017  . ESRD (end stage renal disease) on dialysis [facility])    M,W.F dialysis  . H/O seasonal allergies   . Hypertension   . Pneumonia   . Sleep apnea    not using it now, on continuous O2 2 L during the day, 3 L during the night    Past Surgical History:  Procedure Laterality Date  . AMPUTATION Right 05/14/2018   Procedure: AMPUTATION BELOW KNEE;  Surgeon: Newt Minion, MD;  Location: [facility];  Service: Orthopedics;  Laterality:  Right;  . AMPUTATION Right 08/04/2018   Procedure: RIGHT ABOVE KNEE AMPUTATION;  Surgeon: Newt Minion, MD;  Location: [facility];  Service: Orthopedics;  Laterality: Right;  . AMPUTATION Left 09/08/2018   Procedure: LEFT ABOVE KNEE AMPUTATION;  Surgeon: Newt Minion, MD;  Location: [facility];  Service: Orthopedics;  Laterality: Left;  . APPLICATION OF WOUND VAC Left 09/08/2018   Procedure: Application Of Wound Vac;  Surgeon: Newt Minion, MD;  Location: [place];  Service: Orthopedics;  Laterality: Left;  . ESOPHAGOGASTRODUODENOSCOPY (EGD) WITH PROPOFOL N/A 12/05/2017   Procedure: ESOPHAGOGASTRODUODENOSCOPY (EGD) WITH PROPOFOL;  Surgeon: Doran Stabler, MD;  Location: [facility];  Service: Gastroenterology;  Laterality: N/A;  . HERNIA REPAIR    . IR AV DIALY SHUNT INTRO NEEDLE/INTRACATH INITIAL W/PTA/IMG LEFT  03/30/2017    Social History   Socioeconomic History  . Marital status: Married    Spouse name: Not on file  . Number of children:  Not on file  . Years of education: Not on file  . Highest education level: Not on file  Occupational History  . Not on file  Social Needs  . Financial resource strain: Not on file  . Food insecurity    Worry: Not on file    Inability: Not on file  . Transportation needs    Medical: Not on file    Non-medical: Not on file  Tobacco Use  . Smoking status: Never Smoker  . Smokeless tobacco: Never Used  Substance and Sexual Activity  . Alcohol use: No  . Drug use: No  . Sexual activity: Not on file  Lifestyle  . Physical activity    Days per week: Not on file    Minutes per session: Not on file  . Stress: Not on file  Relationships  . Social Herbalist on phone: Not on file    Gets together: Not on file    Attends religious service: Not on file    Active member of club or organization: Not on file    Attends meetings of clubs or organizations: Not on file    Relationship status: Not on file  . Intimate partner violence    Fear of  current or ex partner: Not on file    Emotionally abused: Not on file    Physically abused: Not on file    Forced sexual activity: Not on file  Other Topics Concern  . Not on file  Social History Narrative  . Not on file    Family History  Problem Relation Age of Onset  . Diabetes Mother   . Hypertension Father      Review of Systems  Constitutional: Negative.  Negative for chills and fever.  HENT: Negative.  Negative for congestion, nosebleeds and sore throat.   Respiratory: Positive for shortness of breath. Negative for cough, sputum production and wheezing.   Cardiovascular: Negative.  Negative for chest pain and palpitations.  Gastrointestinal: Negative.  Negative for abdominal pain, blood in stool, diarrhea, nausea and vomiting.  Genitourinary: Negative for dysuria and hematuria.  Musculoskeletal: Positive for joint pain (Right shoulder pain). Negative for myalgias.  Skin: Negative for rash.  Neurological: Negative for dizziness and headaches.  All other systems reviewed and are negative.  Today's Vitals   01/02/19 1459  BP: (!) 148/85  Pulse: 98  Resp: 16  Temp: 99 F (37.2 C)  TempSrc: Oral  SpO2: 96%   There is no height or weight on file to calculate BMI.   Physical Exam Vitals signs reviewed.  Constitutional:      Appearance: Normal appearance.  HENT:     Head: Normocephalic.  Eyes:     Extraocular Movements: Extraocular movements intact.     Pupils: Pupils are equal, round, and reactive to light.  Neck:     Musculoskeletal: Normal range of motion and neck supple.  Cardiovascular:     Rate and Rhythm: Normal rate and regular rhythm.     Heart sounds: Normal heart sounds.  Pulmonary:     Effort: Pulmonary effort is normal.     Breath sounds: Normal breath sounds.  Musculoskeletal:     Comments: Bilateral AKA's.  Skin:    General: Skin is warm and dry.  Neurological:     Mental Status: He is alert and oriented to person, place, and time.   Psychiatric:        Mood and Affect: Mood normal.  Behavior: Behavior normal.      ASSESSMENT & PLAN: [place] was seen today for referral.  Diagnoses and all orders for this visit:  Chronic hypercapnic respiratory failure ([place]) -     Ambulatory referral to Pulmonology  Acute bursitis of right shoulder -     Ambulatory referral to Orthopedic Surgery  Diabetes mellitus with complication ([place])  S/P AKA (above knee amputation) bilateral (HCC)  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 (HCC)  OSA (obstructive sleep apnea)  Peripheral arterial disease ([place])    Patient Instructions       If you have lab work done today you will be contacted with your lab results within the next 2 weeks.  If you have not heard from [place] then please contact us. The fastest way to get your results is to register for My Chart.   IF you received an x-ray today, you will receive an invoice from [facility]. Please contact [facility] at [phone] with questions or concerns regarding your invoice.   IF you received labwork today, you will receive an invoice from [facility]. Please contact [facility] at [phone] with questions or concerns regarding your invoice.   Our billing staff will not be able to assist you with questions regarding bills from these companies.  You will be contacted with the lab results as soon as they are available. The fastest way to get your results is to activate your My Chart account. Instructions are located on the last page of this paperwork. If you have not heard from [place] regarding the results in 2 weeks, please contact this office.     Diabetes Mellitus and Nutrition, Adult When you have diabetes (diabetes mellitus), it is very important to have healthy eating habits because your blood sugar (glucose) levels are greatly affected by what you eat and drink. Eating healthy foods in the appropriate amounts, at  about the same times every day, can help you:  Control your blood glucose.  Lower your risk of heart disease.  Improve your blood pressure.  Reach or maintain a healthy weight. Every person with diabetes is different, and each person has different needs for a meal plan. Your health care provider may recommend that you work with a diet and nutrition specialist (dietitian) to make a meal plan that is best for you. Your meal plan may vary depending on factors such as:  The calories you need.  The medicines you take.  Your weight.  Your blood glucose, blood pressure, and cholesterol levels.  Your activity level.  Other health conditions you have, such as heart or kidney disease. How do carbohydrates affect me? Carbohydrates, also called carbs, affect your blood glucose level more than any other type of food. Eating carbs naturally raises the amount of glucose in your blood. Carb counting is a method for keeping track of how many carbs you eat. Counting carbs is important to keep your blood glucose at a healthy level, especially if you use insulin or take certain oral diabetes medicines. It is important to know how many carbs you can safely have in each meal. This is different for every person. Your dietitian can help you calculate how many carbs you should have at each meal and for each snack. Foods that contain carbs include:  Bread, cereal, rice, pasta, and crackers.  Potatoes and corn.  Peas, beans, and lentils.  Milk and yogurt.  Fruit and juice.  Desserts, such as cakes, cookies, ice cream, and candy. How  does alcohol affect me? Alcohol can cause a sudden decrease in blood glucose (hypoglycemia), especially if you use insulin or take certain oral diabetes medicines. Hypoglycemia can be a life-threatening condition. Symptoms of hypoglycemia (sleepiness, dizziness, and confusion) are similar to symptoms of having too much alcohol. If your health care provider says that alcohol  is safe for you, follow these guidelines:  Limit alcohol intake to no more than 1 drink per day for nonpregnant women and 2 drinks per day for men. One drink equals 12 oz of beer, 5 oz of wine, or 1 oz of hard liquor.  Do not drink on an empty stomach.  Keep yourself hydrated with water, diet soda, or unsweetened iced tea.  Keep in mind that regular soda, juice, and other mixers may contain a lot of sugar and must be counted as carbs. What are tips for following this plan?  Reading food labels  Start by checking the serving size on the "Nutrition Facts" label of packaged foods and drinks. The amount of calories, carbs, fats, and other nutrients listed on the label is based on one serving of the item. Many items contain more than one serving per package.  Check the total grams (g) of carbs in one serving. You can calculate the number of servings of carbs in one serving by dividing the total carbs by 15. For example, if a food has 30 g of total carbs, it would be equal to 2 servings of carbs.  Check the number of grams (g) of saturated and trans fats in one serving. Choose foods that have low or no amount of these fats.  Check the number of milligrams (mg) of salt (sodium) in one serving. Most people should limit total sodium intake to less than 2,300 mg per day.  Always check the nutrition information of foods labeled as "low-fat" or "nonfat". These foods may be higher in added sugar or refined carbs and should be avoided.  Talk to your dietitian to identify your daily goals for nutrients listed on the label. Shopping  Avoid buying canned, premade, or processed foods. These foods tend to be high in fat, sodium, and added sugar.  Shop around the outside edge of the grocery store. This includes fresh fruits and vegetables, bulk grains, fresh meats, and fresh dairy. Cooking  Use low-heat cooking methods, such as baking, instead of high-heat cooking methods like deep frying.  Cook using  healthy oils, such as olive, canola, or sunflower oil.  Avoid cooking with butter, cream, or high-fat meats. Meal planning  Eat meals and snacks regularly, preferably at the same times every day. Avoid going long periods of time without eating.  Eat foods high in fiber, such as fresh fruits, vegetables, beans, and whole grains. Talk to your dietitian about how many servings of carbs you can eat at each meal.  Eat 4-6 ounces (oz) of lean protein each day, such as lean meat, chicken, fish, eggs, or tofu. One oz of lean protein is equal to: ? 1 oz of meat, chicken, or fish. ? 1 egg. ?  cup of tofu.  Eat some foods each day that contain healthy fats, such as avocado, nuts, seeds, and fish. Lifestyle  Check your blood glucose regularly.  Exercise regularly as told by your health care provider. This may include: ? 150 minutes of moderate-intensity or vigorous-intensity exercise each week. This could be brisk walking, biking, or water aerobics. ? Stretching and doing strength exercises, such as yoga or weightlifting, at least 2  times a week.  Take medicines as told by your health care provider.  Do not use any products that contain nicotine or tobacco, such as cigarettes and e-cigarettes. If you need help quitting, ask your health care provider.  Work with a Social worker or diabetes educator to identify strategies to manage stress and any emotional and social challenges. Questions to ask a health care provider  Do I need to meet with a diabetes educator?  Do I need to meet with a dietitian?  What number can I call if I have questions?  When are the best times to check my blood glucose? Where to find more information:  American Diabetes Association: diabetes.org  Academy of Nutrition and Dietetics: www.eatright.[employer] of Diabetes and Digestive and Kidney Diseases (NIH): [employer] Summary  A healthy meal plan will help you control your blood glucose and  maintain a healthy lifestyle.  Working with a diet and nutrition specialist (dietitian) can help you make a meal plan that is best for you.  Keep in mind that carbohydrates (carbs) and alcohol have immediate effects on your blood glucose levels. It is important to count carbs and to use alcohol carefully. This information is not intended to replace advice given to you by your health care provider. Make sure you discuss any questions you have with your health care provider. Document Released: 12/03/2004 Document Revised: 02/18/2017 Document Reviewed: 04/12/2016 Elsevier Patient Education  2020 [employer].   Valve the following report of heart   Agustina Caroli, MD [facility]

## 2019-01-02 NOTE — Patient Instructions (Addendum)
   If you have lab work done today you will be contacted with your lab results within the next 2 weeks.  If you have not heard from us then please contact us. The fastest way to get your results is to register for My Chart.   IF you received an x-ray today, you will receive an invoice from [facility]. Please contact [facility] at [phone] with questions or concerns regarding your invoice.   IF you received labwork today, you will receive an invoice from [facility]. Please contact [facility] at [phone] with questions or concerns regarding your invoice.   Our billing staff will not be able to assist you with questions regarding bills from these companies.  You will be contacted with the lab results as soon as they are available. The fastest way to get your results is to activate your My Chart account. Instructions are located on the last page of this paperwork. If you have not heard from us regarding the results in 2 weeks, please contact this office.     Diabetes Mellitus and Nutrition, Adult When you have diabetes (diabetes mellitus), it is very important to have healthy eating habits because your blood sugar (glucose) levels are greatly affected by what you eat and drink. Eating healthy foods in the appropriate amounts, at about the same times every day, can help you:  Control your blood glucose.  Lower your risk of heart disease.  Improve your blood pressure.  Reach or maintain a healthy weight. Every person with diabetes is different, and each person has different needs for a meal plan. Your health care provider may recommend that you work with a diet and nutrition specialist (dietitian) to make a meal plan that is best for you. Your meal plan may vary depending on factors such as:  The calories you need.  The medicines you take.  Your weight.  Your blood glucose, blood pressure, and cholesterol levels.  Your activity level.  Other health conditions  you have, such as heart or kidney disease. How do carbohydrates affect me? Carbohydrates, also called carbs, affect your blood glucose level more than any other type of food. Eating carbs naturally raises the amount of glucose in your blood. Carb counting is a method for keeping track of how many carbs you eat. Counting carbs is important to keep your blood glucose at a healthy level, especially if you use insulin or take certain oral diabetes medicines. It is important to know how many carbs you can safely have in each meal. This is different for every person. Your dietitian can help you calculate how many carbs you should have at each meal and for each snack. Foods that contain carbs include:  Bread, cereal, rice, pasta, and crackers.  Potatoes and corn.  Peas, beans, and lentils.  Milk and yogurt.  Fruit and juice.  Desserts, such as cakes, cookies, ice cream, and candy. How does alcohol affect me? Alcohol can cause a sudden decrease in blood glucose (hypoglycemia), especially if you use insulin or take certain oral diabetes medicines. Hypoglycemia can be a life-threatening condition. Symptoms of hypoglycemia (sleepiness, dizziness, and confusion) are similar to symptoms of having too much alcohol. If your health care provider says that alcohol is safe for you, follow these guidelines:  Limit alcohol intake to no more than 1 drink per day for nonpregnant women and 2 drinks per day for men. One drink equals 12 oz of beer, 5 oz of wine, or 1 oz of hard liquor.    Do not drink on an empty stomach.  Keep yourself hydrated with water, diet soda, or unsweetened iced tea.  Keep in mind that regular soda, juice, and other mixers may contain a lot of sugar and must be counted as carbs. What are tips for following this plan?  Reading food labels  Start by checking the serving size on the "Nutrition Facts" label of packaged foods and drinks. The amount of calories, carbs, fats, and other  nutrients listed on the label is based on one serving of the item. Many items contain more than one serving per package.  Check the total grams (g) of carbs in one serving. You can calculate the number of servings of carbs in one serving by dividing the total carbs by 15. For example, if a food has 30 g of total carbs, it would be equal to 2 servings of carbs.  Check the number of grams (g) of saturated and trans fats in one serving. Choose foods that have low or no amount of these fats.  Check the number of milligrams (mg) of salt (sodium) in one serving. Most people should limit total sodium intake to less than 2,300 mg per day.  Always check the nutrition information of foods labeled as "low-fat" or "nonfat". These foods may be higher in added sugar or refined carbs and should be avoided.  Talk to your dietitian to identify your daily goals for nutrients listed on the label. Shopping  Avoid buying canned, premade, or processed foods. These foods tend to be high in fat, sodium, and added sugar.  Shop around the outside edge of the grocery store. This includes fresh fruits and vegetables, bulk grains, fresh meats, and fresh dairy. Cooking  Use low-heat cooking methods, such as baking, instead of high-heat cooking methods like deep frying.  Cook using healthy oils, such as olive, canola, or sunflower oil.  Avoid cooking with butter, cream, or high-fat meats. Meal planning  Eat meals and snacks regularly, preferably at the same times every day. Avoid going long periods of time without eating.  Eat foods high in fiber, such as fresh fruits, vegetables, beans, and whole grains. Talk to your dietitian about how many servings of carbs you can eat at each meal.  Eat 4-6 ounces (oz) of lean protein each day, such as lean meat, chicken, fish, eggs, or tofu. One oz of lean protein is equal to: ? 1 oz of meat, chicken, or fish. ? 1 egg. ?  cup of tofu.  Eat some foods each day that contain  healthy fats, such as avocado, nuts, seeds, and fish. Lifestyle  Check your blood glucose regularly.  Exercise regularly as told by your health care provider. This may include: ? 150 minutes of moderate-intensity or vigorous-intensity exercise each week. This could be brisk walking, biking, or water aerobics. ? Stretching and doing strength exercises, such as yoga or weightlifting, at least 2 times a week.  Take medicines as told by your health care provider.  Do not use any products that contain nicotine or tobacco, such as cigarettes and e-cigarettes. If you need help quitting, ask your health care provider.  Work with a counselor or diabetes educator to identify strategies to manage stress and any emotional and social challenges. Questions to ask a health care provider  Do I need to meet with a diabetes educator?  Do I need to meet with a dietitian?  What number can I call if I have questions?  When are the best times to   check my blood glucose? Where to find more information:  American Diabetes Association: diabetes.org  Academy of Nutrition and Dietetics: www.eatright.org  National Institute of Diabetes and Digestive and Kidney Diseases (NIH): www.niddk.nih.gov Summary  A healthy meal plan will help you control your blood glucose and maintain a healthy lifestyle.  Working with a diet and nutrition specialist (dietitian) can help you make a meal plan that is best for you.  Keep in mind that carbohydrates (carbs) and alcohol have immediate effects on your blood glucose levels. It is important to count carbs and to use alcohol carefully. This information is not intended to replace advice given to you by your health care provider. Make sure you discuss any questions you have with your health care provider. Document Released: 12/03/2004 Document Revised: 02/18/2017 Document Reviewed: 04/12/2016 Elsevier Patient Education  2020 Elsevier Inc.  

## 2019-01-03 ENCOUNTER — Telehealth: Payer: Self-pay | Admitting: *Deleted

## 2019-01-03 DIAGNOSIS — Z992 Dependence on renal dialysis: Secondary | ICD-10-CM | POA: Diagnosis not present

## 2019-01-03 DIAGNOSIS — E1129 Type 2 diabetes mellitus with other diabetic kidney complication: Secondary | ICD-10-CM | POA: Diagnosis not present

## 2019-01-03 DIAGNOSIS — N2581 Secondary hyperparathyroidism of renal origin: Secondary | ICD-10-CM | POA: Diagnosis not present

## 2019-01-03 DIAGNOSIS — N186 End stage renal disease: Secondary | ICD-10-CM | POA: Diagnosis not present

## 2019-01-03 NOTE — Telephone Encounter (Signed)
Faxed signed certificate for oxygen to [facility] Att: Cindie Laroche. Confirmation page 5:22 pm.

## 2019-01-04 DIAGNOSIS — E1122 Type 2 diabetes mellitus with diabetic chronic kidney disease: Secondary | ICD-10-CM | POA: Diagnosis not present

## 2019-01-04 DIAGNOSIS — Z4781 Encounter for orthopedic aftercare following surgical amputation: Secondary | ICD-10-CM | POA: Diagnosis not present

## 2019-01-04 DIAGNOSIS — I132 Hypertensive heart and chronic kidney disease with heart failure and with stage 5 chronic kidney disease, or end stage renal disease: Secondary | ICD-10-CM | POA: Diagnosis not present

## 2019-01-04 DIAGNOSIS — E1151 Type 2 diabetes mellitus with diabetic peripheral angiopathy without gangrene: Secondary | ICD-10-CM | POA: Diagnosis not present

## 2019-01-04 DIAGNOSIS — I872 Venous insufficiency (chronic) (peripheral): Secondary | ICD-10-CM | POA: Diagnosis not present

## 2019-01-04 DIAGNOSIS — I5023 Acute on chronic systolic (congestive) heart failure: Secondary | ICD-10-CM | POA: Diagnosis not present

## 2019-01-05 DIAGNOSIS — N186 End stage renal disease: Secondary | ICD-10-CM | POA: Diagnosis not present

## 2019-01-05 DIAGNOSIS — N2581 Secondary hyperparathyroidism of renal origin: Secondary | ICD-10-CM | POA: Diagnosis not present

## 2019-01-05 DIAGNOSIS — E1129 Type 2 diabetes mellitus with other diabetic kidney complication: Secondary | ICD-10-CM | POA: Diagnosis not present

## 2019-01-05 DIAGNOSIS — Z992 Dependence on renal dialysis: Secondary | ICD-10-CM | POA: Diagnosis not present

## 2019-01-08 ENCOUNTER — Telehealth: Payer: Self-pay | Admitting: Orthopedic Surgery

## 2019-01-08 DIAGNOSIS — Z992 Dependence on renal dialysis: Secondary | ICD-10-CM | POA: Diagnosis not present

## 2019-01-08 DIAGNOSIS — N2581 Secondary hyperparathyroidism of renal origin: Secondary | ICD-10-CM | POA: Diagnosis not present

## 2019-01-08 DIAGNOSIS — E1129 Type 2 diabetes mellitus with other diabetic kidney complication: Secondary | ICD-10-CM | POA: Diagnosis not present

## 2019-01-08 DIAGNOSIS — N186 End stage renal disease: Secondary | ICD-10-CM | POA: Diagnosis not present

## 2019-01-08 NOTE — Telephone Encounter (Signed)
Flor was called and given verbal okay for orders

## 2019-01-08 NOTE — Telephone Encounter (Signed)
Howard Bell from [facility] called requesting VO for [facility] PT for the following:  2x a week for 1 week 2x a week for 2 weeks  [id number].  Thank you.

## 2019-01-09 ENCOUNTER — Telehealth: Payer: Self-pay | Admitting: *Deleted

## 2019-01-09 ENCOUNTER — Other Ambulatory Visit: Payer: Self-pay

## 2019-01-09 ENCOUNTER — Ambulatory Visit (INDEPENDENT_AMBULATORY_CARE_PROVIDER_SITE_OTHER): Payer: Medicare Other | Admitting: Orthopaedic Surgery

## 2019-01-09 ENCOUNTER — Encounter: Payer: Self-pay | Admitting: Orthopaedic Surgery

## 2019-01-09 VITALS — BP 157/82 | HR 98 | Resp 16 | Wt 215.0 lb

## 2019-01-09 DIAGNOSIS — M25511 Pain in right shoulder: Secondary | ICD-10-CM | POA: Diagnosis not present

## 2019-01-09 DIAGNOSIS — I70262 Atherosclerosis of native arteries of extremities with gangrene, left leg: Secondary | ICD-10-CM

## 2019-01-09 DIAGNOSIS — G8929 Other chronic pain: Secondary | ICD-10-CM | POA: Diagnosis not present

## 2019-01-09 MED ORDER — METHYLPREDNISOLONE ACETATE 40 MG/ML IJ SUSP
80.0000 mg | INTRAMUSCULAR | Status: AC | PRN
  Administered 2019-01-09: 80 mg via INTRA_ARTICULAR

## 2019-01-09 MED ORDER — BUPIVACAINE HCL 0.5 % IJ SOLN
2.0000 mL | INTRAMUSCULAR | Status: AC | PRN
  Administered 2019-01-09: 2 mL via INTRA_ARTICULAR

## 2019-01-09 MED ORDER — LIDOCAINE HCL 1 % IJ SOLN
2.0000 mL | INTRAMUSCULAR | Status: AC | PRN
  Administered 2019-01-09: 2 mL

## 2019-01-09 NOTE — Progress Notes (Signed)
Office Visit Note   Patient: Howard Bell           Date of Birth: Dec 10, 1965           MRN: [id number] Visit Date: 01/09/2019              Requested by: Horald Pollen, MD [place],  [place] [address] PCP: Horald Pollen, MD   Assessment & Plan: Visit Diagnoses:  1. Chronic right shoulder pain     Plan: Recurrent right shoulder pain with prior x-rays consistent with early osteoarthritis.  Will inject right shoulder joint intra-articularly and monitor response.  Had excellent response to injection approximately a year and a half ago.  After intra-articular cortisone injection today he had no pain and full overhead motion  Follow-Up Instructions: Return if symptoms worsen or fail to improve.   Orders:  No orders of the defined types were placed in this encounter.  No orders of the defined types were placed in this encounter.     Procedures: Large Joint Inj: R glenohumeral on 01/09/2019 1:51 PM Indications: pain and diagnostic evaluation Details: 25 G 1.5 in needle, medial approach  Arthrogram: No  Medications: 2 mL lidocaine 1 %; 2 mL bupivacaine 0.5 %; 80 mg methylPREDNISolone acetate 40 MG/ML Consent was given by the patient. Immediately prior to procedure a time out was called to verify the correct patient, procedure, equipment, support staff and site/side marked as required. Patient was prepped and draped in the usual sterile fashion.       Clinical Data: No additional findings.   Subjective: No chief complaint on file. Howard Bell is accompanied by family member and here for follow-up evaluation of painful right shoulder.  He was seen nearly a year and a half ago for similar problem with x-rays consistent with early osteoarthritis.  I injected the subacromial space and the entries are particular region with cortisone with excellent response.  He has had some recurrent pain to the point of compromise but tickly with overhead activity.   He has multiple medical comorbidities including bilateral above-knee amputations and spine bends a lot of time moving from bed to chair HPI  Review of Systems  Constitutional: Negative for fatigue and fever.  HENT: Negative for hearing loss and tinnitus.   Eyes: Negative for pain.  Respiratory: Negative for shortness of breath and wheezing.   Cardiovascular: Negative for chest pain.  Gastrointestinal: Negative for constipation and diarrhea.  Endocrine: Negative for cold intolerance and heat intolerance.  Genitourinary: Negative for difficulty urinating and flank pain.  Musculoskeletal: Negative for neck pain and neck stiffness.  Skin: Negative for rash.  Allergic/Immunologic: Negative for food allergies.  Neurological: Negative for dizziness and numbness.  Hematological: Bruises/bleeds easily.  Psychiatric/Behavioral: Negative for confusion. The patient is not nervous/anxious.      Objective: Vital Signs: BP (!) 157/82 (BP Location: Right Arm, Patient Position: Sitting, Cuff Size: Normal)    Pulse 98    Resp 16    Wt 215 lb (97.5 kg)    BMI 29.16 kg/m   Physical Exam Constitutional:      Appearance: He is well-developed.  Eyes:     Pupils: Pupils are equal, round, and reactive to light.  Pulmonary:     Effort: Pulmonary effort is normal.  Skin:    General: Skin is warm and dry.  Neurological:     Mental Status: He is alert and oriented to person, place, and time.  Psychiatric:  Behavior: Behavior normal.     Ortho Exam accompanied by family member.  Evaluated in a wheelchair as he has bilateral above-knee amputations.  Right shoulder with full overhead motion but with circuitous arc of motion.  Minimally positive impingement.  Some loss of external rotation.  Skin intact.  Biceps intact.  Good strength.  No crepitation.  Specialty Comments:  No specialty comments available.  Imaging: No results found.   PMFS History: Patient Active Problem List   Diagnosis  Date Noted   S/P AKA (above knee amputation) bilateral ([place]) 11/07/2018   Chronic hypercapnic respiratory failure ([place]) 10/02/2018   OSA (obstructive sleep apnea)    S/P AKA (above knee amputation) unilateral, right (HCC)    Diabetic peripheral neuropathy (HCC)    Status post bilateral above knee amputation ([place]) 09/08/2018   Atherosclerosis of native arteries of extremities with gangrene, left leg ([place]) 09/05/2018   Peripheral arterial disease (HCC)    Unilateral AKA, right ([place]) 08/17/2018   Drug induced constipation    Uncontrolled diabetes mellitus type 2 with peripheral artery disease (HCC)    Anemia of chronic disease    Diabetes mellitus type 2 in nonobese [facility])    Supplemental oxygen dependent    Unilateral AKA, left ([place]) 08/07/2018   S/P BKA (below knee amputation), right ([place]) 08/04/2018   Critical limb ischemia with history of revascularization of same extremity 05/09/2018   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 ([place]) 11/16/2017   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 ([place]) 11/16/2017   CKD stage 5 due to type 2 diabetes mellitus ([place]) 11/16/2017   Glaucoma due to type 2 diabetes mellitus ([place]) 11/16/2017   Chronic generalized pain 11/16/2017   Recurrent pneumonia 11/06/2017   Diabetic gastroparesis ([place]) 11/06/2017   Generalized abdominal pain 09/13/2017   Pain in right shoulder 07/26/2017   ESRD on dialysis ([place]) 03/28/2017   Anemia due to end stage renal disease ([place]) 08/04/2016   Past Medical History:  Diagnosis Date   Arthritis    Congestive heart failure (CHF) ([place])    Depression    Diabetes mellitus without complication ([place])    insulin dependent   Diabetic gastroparesis ([place]) 11/06/2017   ESRD (end stage renal disease) on dialysis [facility])    M,W.F dialysis   H/O seasonal allergies    Hypertension    Pneumonia    Sleep apnea    not using it now,  on continuous O2 2 L during the day, 3 L during the night    Family History  Problem Relation Age of Onset   Diabetes Mother    Hypertension Father     Past Surgical History:  Procedure Laterality Date   AMPUTATION Right 05/14/2018   Procedure: AMPUTATION BELOW KNEE;  Surgeon: Newt Minion, MD;  Location: [place];  Service: Orthopedics;  Laterality: Right;   AMPUTATION Right 08/04/2018   Procedure: RIGHT ABOVE KNEE AMPUTATION;  Surgeon: Newt Minion, MD;  Location: [place];  Service: Orthopedics;  Laterality: Right;   AMPUTATION Left 09/08/2018   Procedure: LEFT ABOVE KNEE AMPUTATION;  Surgeon: Newt Minion, MD;  Location: [place];  Service: Orthopedics;  Laterality: Left;   APPLICATION OF WOUND VAC Left 09/08/2018   Procedure: Application Of Wound Vac;  Surgeon: Newt Minion, MD;  Location: [place];  Service: Orthopedics;  Laterality: Left;   ESOPHAGOGASTRODUODENOSCOPY (EGD) WITH PROPOFOL N/A 12/05/2017   Procedure: ESOPHAGOGASTRODUODENOSCOPY (EGD) WITH PROPOFOL;  Surgeon:  Doran Stabler, MD;  Location: [facility];  Service: Gastroenterology;  Laterality: N/A;   HERNIA REPAIR     IR AV DIALY SHUNT INTRO NEEDLE/INTRACATH INITIAL W/PTA/IMG LEFT  03/30/2017   Social History   Occupational History   Not on file  Tobacco Use   Smoking status: Never Smoker   Smokeless tobacco: Never Used  Substance and Sexual Activity   Alcohol use: No   Drug use: No   Sexual activity: Not on file     Garald Balding, MD   Note - This record has been created using Editor, commissioning.  Chart creation errors have been sought, but may not always  have been located. Such creation errors do not reflect on  the standard of medical care.

## 2019-01-09 NOTE — Telephone Encounter (Signed)
Faxed signed certification order (Section B) for oxygen to Attn: Jetty Peeks. Confirmation page 11:40 am.

## 2019-01-10 DIAGNOSIS — E1129 Type 2 diabetes mellitus with other diabetic kidney complication: Secondary | ICD-10-CM | POA: Diagnosis not present

## 2019-01-10 DIAGNOSIS — N186 End stage renal disease: Secondary | ICD-10-CM | POA: Diagnosis not present

## 2019-01-10 DIAGNOSIS — Z992 Dependence on renal dialysis: Secondary | ICD-10-CM | POA: Diagnosis not present

## 2019-01-10 DIAGNOSIS — N2581 Secondary hyperparathyroidism of renal origin: Secondary | ICD-10-CM | POA: Diagnosis not present

## 2019-01-12 ENCOUNTER — Other Ambulatory Visit: Payer: Self-pay

## 2019-01-12 DIAGNOSIS — N2581 Secondary hyperparathyroidism of renal origin: Secondary | ICD-10-CM | POA: Diagnosis not present

## 2019-01-12 DIAGNOSIS — Z992 Dependence on renal dialysis: Secondary | ICD-10-CM | POA: Diagnosis not present

## 2019-01-12 DIAGNOSIS — N186 End stage renal disease: Secondary | ICD-10-CM | POA: Diagnosis not present

## 2019-01-12 DIAGNOSIS — E1129 Type 2 diabetes mellitus with other diabetic kidney complication: Secondary | ICD-10-CM | POA: Diagnosis not present

## 2019-01-15 DIAGNOSIS — N186 End stage renal disease: Secondary | ICD-10-CM | POA: Diagnosis not present

## 2019-01-15 DIAGNOSIS — Z992 Dependence on renal dialysis: Secondary | ICD-10-CM | POA: Diagnosis not present

## 2019-01-15 DIAGNOSIS — E1129 Type 2 diabetes mellitus with other diabetic kidney complication: Secondary | ICD-10-CM | POA: Diagnosis not present

## 2019-01-15 DIAGNOSIS — N2581 Secondary hyperparathyroidism of renal origin: Secondary | ICD-10-CM | POA: Diagnosis not present

## 2019-01-16 ENCOUNTER — Ambulatory Visit: Payer: Medicare Other | Admitting: Endocrinology

## 2019-01-17 DIAGNOSIS — Z992 Dependence on renal dialysis: Secondary | ICD-10-CM | POA: Diagnosis not present

## 2019-01-17 DIAGNOSIS — N186 End stage renal disease: Secondary | ICD-10-CM | POA: Diagnosis not present

## 2019-01-17 DIAGNOSIS — E1129 Type 2 diabetes mellitus with other diabetic kidney complication: Secondary | ICD-10-CM | POA: Diagnosis not present

## 2019-01-17 DIAGNOSIS — N2581 Secondary hyperparathyroidism of renal origin: Secondary | ICD-10-CM | POA: Diagnosis not present

## 2019-01-19 DIAGNOSIS — N186 End stage renal disease: Secondary | ICD-10-CM | POA: Diagnosis not present

## 2019-01-19 DIAGNOSIS — Z992 Dependence on renal dialysis: Secondary | ICD-10-CM | POA: Diagnosis not present

## 2019-01-19 DIAGNOSIS — E1129 Type 2 diabetes mellitus with other diabetic kidney complication: Secondary | ICD-10-CM | POA: Diagnosis not present

## 2019-01-19 DIAGNOSIS — N2581 Secondary hyperparathyroidism of renal origin: Secondary | ICD-10-CM | POA: Diagnosis not present

## 2019-01-21 DIAGNOSIS — N186 End stage renal disease: Secondary | ICD-10-CM | POA: Diagnosis not present

## 2019-01-21 DIAGNOSIS — Z992 Dependence on renal dialysis: Secondary | ICD-10-CM | POA: Diagnosis not present

## 2019-01-21 DIAGNOSIS — E1129 Type 2 diabetes mellitus with other diabetic kidney complication: Secondary | ICD-10-CM | POA: Diagnosis not present

## 2019-01-22 ENCOUNTER — Other Ambulatory Visit: Payer: Self-pay | Admitting: Emergency Medicine

## 2019-01-22 DIAGNOSIS — N2581 Secondary hyperparathyroidism of renal origin: Secondary | ICD-10-CM | POA: Diagnosis not present

## 2019-01-22 DIAGNOSIS — E1129 Type 2 diabetes mellitus with other diabetic kidney complication: Secondary | ICD-10-CM | POA: Diagnosis not present

## 2019-01-22 DIAGNOSIS — I110 Hypertensive heart disease with heart failure: Secondary | ICD-10-CM

## 2019-01-22 DIAGNOSIS — I5023 Acute on chronic systolic (congestive) heart failure: Secondary | ICD-10-CM

## 2019-01-22 DIAGNOSIS — Z992 Dependence on renal dialysis: Secondary | ICD-10-CM | POA: Diagnosis not present

## 2019-01-22 DIAGNOSIS — Z23 Encounter for immunization: Secondary | ICD-10-CM | POA: Diagnosis not present

## 2019-01-22 DIAGNOSIS — K3184 Gastroparesis: Secondary | ICD-10-CM

## 2019-01-22 DIAGNOSIS — N186 End stage renal disease: Secondary | ICD-10-CM | POA: Diagnosis not present

## 2019-01-22 DIAGNOSIS — E1143 Type 2 diabetes mellitus with diabetic autonomic (poly)neuropathy: Secondary | ICD-10-CM

## 2019-01-22 MED ORDER — METOPROLOL SUCCINATE ER 50 MG PO TB24: 50.0000 mg | ORAL_TABLET | Freq: Every day | ORAL | 3 refills | Status: DC

## 2019-01-22 MED ORDER — RENA-VITE PO TABS: 1.0000 | ORAL_TABLET | Freq: Every day | ORAL | 1 refills | Status: AC

## 2019-01-22 MED ORDER — OMEPRAZOLE 20 MG PO CPDR: 20.0000 mg | DELAYED_RELEASE_CAPSULE | Freq: Every day | ORAL | 3 refills | Status: AC

## 2019-01-22 MED ORDER — DOCUSATE SODIUM 100 MG PO CAPS: 100.0000 mg | ORAL_CAPSULE | Freq: Two times a day (BID) | ORAL | 1 refills | Status: DC

## 2019-01-22 MED ORDER — AMLODIPINE BESYLATE 10 MG PO TABS: 10.0000 mg | ORAL_TABLET | Freq: Every day | ORAL | 1 refills | Status: DC

## 2019-01-22 NOTE — Telephone Encounter (Signed)
Patient is requesting a 90 day supply

## 2019-01-22 NOTE — Telephone Encounter (Signed)
Medication Refill - Medication: docusate sodium (COLACE) 100 MG capsule/amLODipine (NORVASC) 10 MG tablet/metoprolol succinate (TOPROL-XL) 50 MG 24 hr tablet/multivitamin (RENA-VIT) TABS tablet/omeprazole (PRILOSEC) 20 MG capsule/  Completely out of Colace, Omeprazole, and multivitamin  Has the patient contacted their pharmacy? Yes.   (Agent: If no, request that the patient contact the pharmacy for the refill.) (Agent: If yes, when and what did the pharmacy advise?)  Preferred Pharmacy (with phone number or street name):  [facility] [place], [place] [phone] (Phone) [phone] (Fax)     Agent: Please be advised that RX refills may take up to 3 business days. We ask that you follow-up with your pharmacy.

## 2019-01-24 DIAGNOSIS — N186 End stage renal disease: Secondary | ICD-10-CM | POA: Diagnosis not present

## 2019-01-24 DIAGNOSIS — N2581 Secondary hyperparathyroidism of renal origin: Secondary | ICD-10-CM | POA: Diagnosis not present

## 2019-01-24 DIAGNOSIS — Z23 Encounter for immunization: Secondary | ICD-10-CM | POA: Diagnosis not present

## 2019-01-24 DIAGNOSIS — E1129 Type 2 diabetes mellitus with other diabetic kidney complication: Secondary | ICD-10-CM | POA: Diagnosis not present

## 2019-01-24 DIAGNOSIS — Z992 Dependence on renal dialysis: Secondary | ICD-10-CM | POA: Diagnosis not present

## 2019-01-26 ENCOUNTER — Other Ambulatory Visit: Payer: Self-pay

## 2019-01-26 DIAGNOSIS — Z992 Dependence on renal dialysis: Secondary | ICD-10-CM | POA: Diagnosis not present

## 2019-01-26 DIAGNOSIS — E1129 Type 2 diabetes mellitus with other diabetic kidney complication: Secondary | ICD-10-CM | POA: Diagnosis not present

## 2019-01-26 DIAGNOSIS — N186 End stage renal disease: Secondary | ICD-10-CM | POA: Diagnosis not present

## 2019-01-26 DIAGNOSIS — Z23 Encounter for immunization: Secondary | ICD-10-CM | POA: Diagnosis not present

## 2019-01-26 DIAGNOSIS — N2581 Secondary hyperparathyroidism of renal origin: Secondary | ICD-10-CM | POA: Diagnosis not present

## 2019-01-29 DIAGNOSIS — Z23 Encounter for immunization: Secondary | ICD-10-CM | POA: Diagnosis not present

## 2019-01-29 DIAGNOSIS — N186 End stage renal disease: Secondary | ICD-10-CM | POA: Diagnosis not present

## 2019-01-29 DIAGNOSIS — Z992 Dependence on renal dialysis: Secondary | ICD-10-CM | POA: Diagnosis not present

## 2019-01-29 DIAGNOSIS — N2581 Secondary hyperparathyroidism of renal origin: Secondary | ICD-10-CM | POA: Diagnosis not present

## 2019-01-29 DIAGNOSIS — E1129 Type 2 diabetes mellitus with other diabetic kidney complication: Secondary | ICD-10-CM | POA: Diagnosis not present

## 2019-01-30 ENCOUNTER — Other Ambulatory Visit: Payer: Self-pay

## 2019-01-30 ENCOUNTER — Ambulatory Visit (INDEPENDENT_AMBULATORY_CARE_PROVIDER_SITE_OTHER): Payer: Medicare Other | Admitting: Endocrinology

## 2019-01-30 ENCOUNTER — Encounter: Payer: Medicare Other | Attending: Registered Nurse | Admitting: Physical Medicine & Rehabilitation

## 2019-01-30 ENCOUNTER — Encounter: Payer: Self-pay | Admitting: Endocrinology

## 2019-01-30 ENCOUNTER — Encounter: Payer: Self-pay | Admitting: Physical Medicine & Rehabilitation

## 2019-01-30 VITALS — BP 122/76 | HR 102 | Ht 72.0 in | Wt 200.0 lb

## 2019-01-30 VITALS — BP 142/82 | HR 96

## 2019-01-30 DIAGNOSIS — E119 Type 2 diabetes mellitus without complications: Secondary | ICD-10-CM

## 2019-01-30 DIAGNOSIS — E1142 Type 2 diabetes mellitus with diabetic polyneuropathy: Secondary | ICD-10-CM | POA: Insufficient documentation

## 2019-01-30 DIAGNOSIS — E1122 Type 2 diabetes mellitus with diabetic chronic kidney disease: Secondary | ICD-10-CM | POA: Diagnosis not present

## 2019-01-30 DIAGNOSIS — E1022 Type 1 diabetes mellitus with diabetic chronic kidney disease: Secondary | ICD-10-CM

## 2019-01-30 DIAGNOSIS — I70262 Atherosclerosis of native arteries of extremities with gangrene, left leg: Secondary | ICD-10-CM

## 2019-01-30 DIAGNOSIS — I509 Heart failure, unspecified: Secondary | ICD-10-CM | POA: Insufficient documentation

## 2019-01-30 DIAGNOSIS — N186 End stage renal disease: Secondary | ICD-10-CM | POA: Diagnosis not present

## 2019-01-30 DIAGNOSIS — I132 Hypertensive heart and chronic kidney disease with heart failure and with stage 5 chronic kidney disease, or end stage renal disease: Secondary | ICD-10-CM | POA: Diagnosis not present

## 2019-01-30 DIAGNOSIS — E1143 Type 2 diabetes mellitus with diabetic autonomic (poly)neuropathy: Secondary | ICD-10-CM | POA: Diagnosis not present

## 2019-01-30 DIAGNOSIS — G4730 Sleep apnea, unspecified: Secondary | ICD-10-CM | POA: Insufficient documentation

## 2019-01-30 DIAGNOSIS — K3184 Gastroparesis: Secondary | ICD-10-CM | POA: Diagnosis not present

## 2019-01-30 DIAGNOSIS — N189 Chronic kidney disease, unspecified: Secondary | ICD-10-CM | POA: Diagnosis not present

## 2019-01-30 DIAGNOSIS — Z4781 Encounter for orthopedic aftercare following surgical amputation: Secondary | ICD-10-CM | POA: Insufficient documentation

## 2019-01-30 DIAGNOSIS — Z89611 Acquired absence of right leg above knee: Secondary | ICD-10-CM | POA: Diagnosis not present

## 2019-01-30 DIAGNOSIS — Z89612 Acquired absence of left leg above knee: Secondary | ICD-10-CM | POA: Insufficient documentation

## 2019-01-30 DIAGNOSIS — Z8249 Family history of ischemic heart disease and other diseases of the circulatory system: Secondary | ICD-10-CM | POA: Diagnosis not present

## 2019-01-30 DIAGNOSIS — Z833 Family history of diabetes mellitus: Secondary | ICD-10-CM | POA: Insufficient documentation

## 2019-01-30 DIAGNOSIS — R269 Unspecified abnormalities of gait and mobility: Secondary | ICD-10-CM | POA: Insufficient documentation

## 2019-01-30 DIAGNOSIS — Z992 Dependence on renal dialysis: Secondary | ICD-10-CM | POA: Diagnosis not present

## 2019-01-30 DIAGNOSIS — E1042 Type 1 diabetes mellitus with diabetic polyneuropathy: Secondary | ICD-10-CM | POA: Diagnosis not present

## 2019-01-30 LAB — POCT GLYCOSYLATED HEMOGLOBIN (HGB A1C): Hemoglobin A1C: 7.3 % — AB (ref 4.0–5.6)

## 2019-01-30 MED ORDER — INSULIN LISPRO (1 UNIT DIAL) 100 UNIT/ML (KWIKPEN): PEN_INJECTOR | SUBCUTANEOUS | 11 refills | Status: DC

## 2019-01-30 NOTE — Progress Notes (Signed)
Subjective:    Patient ID: Howard Bell, male    DOB: 01-14-1966, 53 y.o.   MRN: [id number]  HPI Pt returns for f/u of diabetes mellitus: DM type: 1 Dx'ed: [id number] Complications: polyneuropathy and ESRD (HD).  Therapy: insulin since soon after dx.  DKA: last episode was 2003 Severe hypoglycemia: last episode was in 2008 Pancreatitis: never Pancreatic imaging: normal on 2019 CT Other: he takes multiple daily injections; he does not need basal insulin.   Interval history: He takes humalog 5 units 3 times a day (just before each meal).  no cbg record, but states cbg's vary from 50-284.  It is in general lowest fasting.   Past Medical History:  Diagnosis Date  . Arthritis   . Congestive heart failure (CHF) ([place])   . Depression   . 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 Diabetic gastroparesis ([place]) 11/06/2017  . ESRD (end stage renal disease) on dialysis [facility])    M,W.F dialysis  . H/O seasonal allergies   . Hypertension   . Pneumonia   . Sleep apnea    not using it now, on continuous O2 2 L during the day, 3 L during the night    Past Surgical History:  Procedure Laterality Date  . AMPUTATION Right 05/14/2018   Procedure: AMPUTATION BELOW KNEE;  Surgeon: Newt Minion, MD;  Location: [place];  Service: Orthopedics;  Laterality: Right;  . AMPUTATION Right 08/04/2018   Procedure: RIGHT ABOVE KNEE AMPUTATION;  Surgeon: Newt Minion, MD;  Location: [place];  Service: Orthopedics;  Laterality: Right;  . AMPUTATION Left 09/08/2018   Procedure: LEFT ABOVE KNEE AMPUTATION;  Surgeon: Newt Minion, MD;  Location: [place];  Service: Orthopedics;  Laterality: Left;  . APPLICATION OF WOUND VAC Left 09/08/2018   Procedure: Application Of Wound Vac;  Surgeon: Newt Minion, MD;  Location: [place];  Service: Orthopedics;  Laterality: Left;  . ESOPHAGOGASTRODUODENOSCOPY (EGD) WITH PROPOFOL N/A 12/05/2017   Procedure: ESOPHAGOGASTRODUODENOSCOPY (EGD) WITH PROPOFOL;   Surgeon: Doran Stabler, MD;  Location: [facility];  Service: Gastroenterology;  Laterality: N/A;  . HERNIA REPAIR    . IR AV DIALY SHUNT INTRO NEEDLE/INTRACATH INITIAL W/PTA/IMG LEFT  03/30/2017    Social History   Socioeconomic History  . Marital status: Married    Spouse name: Not on file  . Number of children: Not on file  . Years of education: Not on file  . Highest education level: Not on file  Occupational History  . Not on file  Social Needs  . Financial resource strain: Not on file  . Food insecurity    Worry: Not on file    Inability: Not on file  . Transportation needs    Medical: Not on file    Non-medical: Not on file  Tobacco Use  . Smoking status: Never Smoker  . Smokeless tobacco: Never Used  Substance and Sexual Activity  . Alcohol use: No  . Drug use: No  . Sexual activity: Not on file  Lifestyle  . Physical activity    Days per week: Not on file    Minutes per session: Not on file  . Stress: Not on file  Relationships  . Social Herbalist on phone: Not on file    Gets together: Not on file    Attends religious service: Not on file    Active member of club or organization: Not on file  Attends meetings of clubs or organizations: Not on file    Relationship status: Not on file  . Intimate partner violence    Fear of current or ex partner: Not on file    Emotionally abused: Not on file    Physically abused: Not on file    Forced sexual activity: Not on file  Other Topics Concern  . Not on file  Social History Narrative  . Not on file    Current Outpatient Medications on File Prior to Visit  Medication Sig Dispense Refill  . acetaminophen (TYLENOL) 325 MG tablet Take 1-2 tablets (325-650 mg total) by mouth every 4 (four) hours as needed for mild pain.    Marland Kitchen albuterol (PROVENTIL HFA;VENTOLIN HFA) 108 (90 Base) MCG/ACT inhaler Inhale 1 puff into the lungs every 4 (four) hours as needed for wheezing or shortness of breath.     Marland Kitchen  amLODipine (NORVASC) 10 MG tablet Take 1 tablet (10 mg total) by mouth daily. 30 tablet 1  . brimonidine (ALPHAGAN) 0.2 % ophthalmic solution Place 1 drop into both eyes 2 (two) times daily.    Marland Kitchen docusate sodium (COLACE) 100 MG capsule Take 1 capsule (100 mg total) by mouth 2 (two) times daily. 60 capsule 1  . ferric citrate (AURYXIA) 1 GM 210 MG(Fe) tablet Take 1 tablet (210 mg total) by mouth 3 (three) times daily with meals. 90 tablet 1  . fluticasone (FLONASE) 50 MCG/ACT nasal spray Place 1 spray into both nostrils daily.    Marland Kitchen glucose blood test strip 1 each by Other route as needed for other. Use as instructed 100 each 11  . Lancets (ACCU-CHEK SOFT TOUCH) lancets Use as instructed 100 each 12  . latanoprost (XALATAN) 0.005 % ophthalmic solution Place 1 drop into both eyes at bedtime.    . methocarbamol (ROBAXIN) 500 MG tablet Take by mouth.    . metoCLOPramide (REGLAN) 5 MG tablet Take 1 tablet (5 mg total) by mouth 3 (three) times daily before meals. 90 tablet 0  . metoprolol succinate (TOPROL-XL) 50 MG 24 hr tablet Take 1 tablet (50 mg total) by mouth at bedtime. Take with a meal. 90 tablet 3  . multivitamin (RENA-VIT) TABS tablet Take 1 tablet by mouth at bedtime. 30 tablet 1  . omeprazole (PRILOSEC) 20 MG capsule Take 1 capsule (20 mg total) by mouth at bedtime. 90 capsule 3  . timolol (BETIMOL) 0.5 % ophthalmic solution Place 1 drop into both eyes 2 (two) times daily. 10 mL 1   No current facility-administered medications on file prior to visit.     Allergies  Allergen Reactions  . Gabapentin Other (See Comments)    Altered mental status    Family History  Problem Relation Age of Onset  . Diabetes Mother   . Hypertension Father     BP 122/76 (BP Location: Right Arm, Patient Position: Sitting, Cuff Size: Large)   Pulse (!) 102   Ht 6' (1.829 m)   Wt 200 lb (90.7 kg) Comment: verbalized  SpO2 90%   BMI 27.12 kg/m    Review of Systems He has mild hypoglycemia approx      Objective:   Physical Exam VITAL SIGNS:  See vs page GENERAL: no distress.  In wheelchair.   Ext: bilat AKA's  Lab Results  Component Value Date   HGBA1C 7.3 (A) 01/30/2019   Lab Results  Component Value Date   CREATININE 9.88 (H) 09/29/2018   BUN 63 (H) 09/29/2018   NA 132 (  L) 09/29/2018   K 4.2 09/29/2018   CL 94 (L) 09/29/2018   CO2 25 09/29/2018       Assessment & Plan:  Type 1 DM, with PN: The pattern of his cbg's indicates he needs some adjustment in his therapy.  Renal failure: this results in long duration of action of insulin, so he does not need basal insulin.  Hypoglycemia: this limits aggressiveness of glycemic control.   Patient Instructions  Please change the humalog to 3 times a day (just before each meal) 6-6-4 units.   check your blood sugar twice a day.  vary the time of day when you check, between before the 3 meals, and at bedtime.  also check if you have symptoms of your blood sugar being too high or too low.  please keep a record of the readings and bring it to your next appointment here (or you can bring the meter itself).  You can write it on any piece of paper.  please call us sooner if your blood sugar goes below 70, or if you have a lot of readings over 200.  Please come back for a follow-up appointment in 2 months.

## 2019-01-30 NOTE — Progress Notes (Signed)
Subjective:    Patient ID: Howard Bell, male    DOB: 20-Mar-1966, 53 y.o.   MRN: [id number]  HPI Male with history of T2DM with neuropathy and retinopathy, ESRD-hemodialysis MWF, OSA with chronic hypoxic respiratory failure, PVD with gangrenous changes BLE s/p right AKA presents for follow up for now b/l AKA.   Wife provides history. Last seen on 11/07/2018.  Since that time, he saw Ortho and she was referred for Prosthesis.  He was told to decrease oxygen requirement prior to prosthesis.  He is going to following up with Pulm. He has been released from therapies. Pain is controlled. He is supposed to receive another Bipap and wheelchair. CBGs have been ~170. He had a fall 2 months ago.   Pain Inventory Average Pain 0 Pain Right Now 0 My pain is na  In the last 24 hours, has pain interfered with the following? General activity 0 Relation with others 0 Enjoyment of life 0 What TIME of day is your pain at its worst? night Sleep (in general) Fair  Pain is worse with: no pain Pain improves with: no pain Relief from Meds: no pain  Mobility use a wheelchair  Function disabled: date disabled .  Neuro/Psych No problems in this area  Prior Studies Any changes since last visit?  no  Physicians involved in your care Any changes since last visit?  no   Family History  Problem Relation Age of Onset  . Diabetes Mother   . Hypertension Father    Social History   Socioeconomic History  . Marital status: Married    Spouse name: Not on file  . Number of children: Not on file  . Years of education: Not on file  . Highest education level: Not on file  Occupational History  . Not on file  Social Needs  . Financial resource strain: Not on file  . Food insecurity    Worry: Not on file    Inability: Not on file  . Transportation needs    Medical: Not on file    Non-medical: Not on file  Tobacco Use  . Smoking status: Never Smoker  . Smokeless tobacco: Never Used   Substance and Sexual Activity  . Alcohol use: No  . Drug use: No  . Sexual activity: Not on file  Lifestyle  . Physical activity    Days per week: Not on file    Minutes per session: Not on file  . Stress: Not on file  Relationships  . Social Herbalist on phone: Not on file    Gets together: Not on file    Attends religious service: Not on file    Active member of club or organization: Not on file    Attends meetings of clubs or organizations: Not on file    Relationship status: Not on file  Other Topics Concern  . Not on file  Social History Narrative  . Not on file   Past Surgical History:  Procedure Laterality Date  . AMPUTATION Right 05/14/2018   Procedure: AMPUTATION BELOW KNEE;  Surgeon: Newt Minion, MD;  Location: [place];  Service: Orthopedics;  Laterality: Right;  . AMPUTATION Right 08/04/2018   Procedure: RIGHT ABOVE KNEE AMPUTATION;  Surgeon: Newt Minion, MD;  Location: [place];  Service: Orthopedics;  Laterality: Right;  . AMPUTATION Left 09/08/2018   Procedure: LEFT ABOVE KNEE AMPUTATION;  Surgeon: Newt Minion, MD;  Location: [place];  Service: Orthopedics;  Laterality: Left;  .  APPLICATION OF WOUND VAC Left 09/08/2018   Procedure: Application Of Wound Vac;  Surgeon: Newt Minion, MD;  Location: [place];  Service: Orthopedics;  Laterality: Left;  . ESOPHAGOGASTRODUODENOSCOPY (EGD) WITH PROPOFOL N/A 12/05/2017   Procedure: ESOPHAGOGASTRODUODENOSCOPY (EGD) WITH PROPOFOL;  Surgeon: Doran Stabler, MD;  Location: [facility];  Service: Gastroenterology;  Laterality: N/A;  . HERNIA REPAIR    . IR AV DIALY SHUNT INTRO NEEDLE/INTRACATH INITIAL W/PTA/IMG LEFT  03/30/2017   Past Medical History:  Diagnosis Date  . Arthritis   . Congestive heart failure (CHF) ([place])   . Depression   . 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 Diabetic gastroparesis ([place]) 11/06/2017  . ESRD (end stage renal disease) on dialysis [facility])    M,W.F  dialysis  . H/O seasonal allergies   . Hypertension   . Pneumonia   . Sleep apnea    not using it now, on continuous O2 2 L during the day, 3 L during the night   BP (!) 142/82   Pulse 96   SpO2 (!) 83%   Opioid Risk Score:   Fall Risk Score:  `1  Depression screen PHQ 2/9  Depression screen [facility] 2/9 01/02/2019 10/31/2018 04/01/2018 09/13/2017 07/26/2017 05/07/2017  Decreased Interest 2 0 0 1 0 0  Down, Depressed, Hopeless 1 0 0 2 0 2  PHQ - 2 Score 3 0 0 3 0 2  Altered sleeping 3 - - 3 - 3  Tired, decreased energy 3 - - 1 - 1  Change in appetite 0 - - 2 - 0  Feeling bad or failure about yourself  1 - - 3 - 2  Trouble concentrating 0 - - 2 - 2  Moving slowly or fidgety/restless 0 - - 3 - 1  Suicidal thoughts 1 - - 2 - 2  PHQ-9 Score 11 - - 19 - 13  Difficult doing work/chores - - - - - Somewhat difficult     Review of Systems  Constitutional: Negative.   HENT: Negative.   Eyes: Negative.   Respiratory: Negative.   Cardiovascular: Negative.   Gastrointestinal: Negative.   Endocrine: Negative.   Genitourinary: Negative.   Musculoskeletal: Positive for gait problem.  Skin: Negative.   Allergic/Immunologic: Negative.   Hematological: Negative.   Psychiatric/Behavioral: Negative.        Objective:   Physical Exam Constitutional: No distress . Vital signs reviewed. HENT: Normocephalic.  Atraumatic. Eyes: EOMI. No discharge. Cardiovascular: No JVD. Respiratory: Normal effort.  No stridor. GI: Non-distended. Skin: B/l AKA healed Psych: Flat Musc: B/l AKA Neurological: Alert Motor: Bilateral UE 5/5 proximal to distal.  RLE: 5/5 hip flexion LLE: 5/5 hip flexion    Assessment & Plan:  Male with history of T2DM with neuropathy and retinopathy, ESRD-hemodialysis MWF, OSA with chronic hypoxic respiratory failure, PVD with gangrenous changes BLE s/p right AKA presents for follow up for now b/l AKA  1.  Functional deficits secondary to PAD, with b/l AKA  Cont follow up  with Ortho  Released from therapies  2. Pain Management:   Controlled at present  3. OSA/Chronic respiratory failure:   Follow up with sleep specialist for BiPAP device (hose broken), states in process of getting equipment  Follow up with Pulm regarding hypoxia and prosthesis  4. T2DM with gastroparesis and neuropathy:   Brittle diabetic. Continue SSI as at home.              Appears relatively controlled  at present  Follow up with PCP  5. Gait abnormality  Cont wheelchair for safety  Cont HEP

## 2019-01-30 NOTE — Patient Instructions (Addendum)
Please change the humalog to 3 times a day (just before each meal) 6-6-4 units.   check your blood sugar twice a day.  vary the time of day when you check, between before the 3 meals, and at bedtime.  also check if you have symptoms of your blood sugar being too high or too low.  please keep a record of the readings and bring it to your next appointment here (or you can bring the meter itself).  You can write it on any piece of paper.  please call us sooner if your blood sugar goes below 70, or if you have a lot of readings over 200.  Please come back for a follow-up appointment in 2 months.

## 2019-01-31 DIAGNOSIS — Z992 Dependence on renal dialysis: Secondary | ICD-10-CM | POA: Diagnosis not present

## 2019-01-31 DIAGNOSIS — E1129 Type 2 diabetes mellitus with other diabetic kidney complication: Secondary | ICD-10-CM | POA: Diagnosis not present

## 2019-01-31 DIAGNOSIS — Z23 Encounter for immunization: Secondary | ICD-10-CM | POA: Diagnosis not present

## 2019-01-31 DIAGNOSIS — N186 End stage renal disease: Secondary | ICD-10-CM | POA: Diagnosis not present

## 2019-01-31 DIAGNOSIS — N2581 Secondary hyperparathyroidism of renal origin: Secondary | ICD-10-CM | POA: Diagnosis not present

## 2019-02-02 DIAGNOSIS — Z23 Encounter for immunization: Secondary | ICD-10-CM | POA: Diagnosis not present

## 2019-02-02 DIAGNOSIS — N186 End stage renal disease: Secondary | ICD-10-CM | POA: Diagnosis not present

## 2019-02-02 DIAGNOSIS — N2581 Secondary hyperparathyroidism of renal origin: Secondary | ICD-10-CM | POA: Diagnosis not present

## 2019-02-02 DIAGNOSIS — Z992 Dependence on renal dialysis: Secondary | ICD-10-CM | POA: Diagnosis not present

## 2019-02-02 DIAGNOSIS — E1129 Type 2 diabetes mellitus with other diabetic kidney complication: Secondary | ICD-10-CM | POA: Diagnosis not present

## 2019-02-05 DIAGNOSIS — E1129 Type 2 diabetes mellitus with other diabetic kidney complication: Secondary | ICD-10-CM | POA: Diagnosis not present

## 2019-02-05 DIAGNOSIS — N2581 Secondary hyperparathyroidism of renal origin: Secondary | ICD-10-CM | POA: Diagnosis not present

## 2019-02-05 DIAGNOSIS — N186 End stage renal disease: Secondary | ICD-10-CM | POA: Diagnosis not present

## 2019-02-05 DIAGNOSIS — Z23 Encounter for immunization: Secondary | ICD-10-CM | POA: Diagnosis not present

## 2019-02-05 DIAGNOSIS — Z992 Dependence on renal dialysis: Secondary | ICD-10-CM | POA: Diagnosis not present

## 2019-02-07 DIAGNOSIS — E1129 Type 2 diabetes mellitus with other diabetic kidney complication: Secondary | ICD-10-CM | POA: Diagnosis not present

## 2019-02-07 DIAGNOSIS — N2581 Secondary hyperparathyroidism of renal origin: Secondary | ICD-10-CM | POA: Diagnosis not present

## 2019-02-07 DIAGNOSIS — Z23 Encounter for immunization: Secondary | ICD-10-CM | POA: Diagnosis not present

## 2019-02-07 DIAGNOSIS — Z992 Dependence on renal dialysis: Secondary | ICD-10-CM | POA: Diagnosis not present

## 2019-02-07 DIAGNOSIS — N186 End stage renal disease: Secondary | ICD-10-CM | POA: Diagnosis not present

## 2019-02-09 DIAGNOSIS — Z23 Encounter for immunization: Secondary | ICD-10-CM | POA: Diagnosis not present

## 2019-02-09 DIAGNOSIS — Z992 Dependence on renal dialysis: Secondary | ICD-10-CM | POA: Diagnosis not present

## 2019-02-09 DIAGNOSIS — N186 End stage renal disease: Secondary | ICD-10-CM | POA: Diagnosis not present

## 2019-02-09 DIAGNOSIS — N2581 Secondary hyperparathyroidism of renal origin: Secondary | ICD-10-CM | POA: Diagnosis not present

## 2019-02-09 DIAGNOSIS — E1129 Type 2 diabetes mellitus with other diabetic kidney complication: Secondary | ICD-10-CM | POA: Diagnosis not present

## 2019-02-11 DIAGNOSIS — E1129 Type 2 diabetes mellitus with other diabetic kidney complication: Secondary | ICD-10-CM | POA: Diagnosis not present

## 2019-02-11 DIAGNOSIS — Z23 Encounter for immunization: Secondary | ICD-10-CM | POA: Diagnosis not present

## 2019-02-11 DIAGNOSIS — N2581 Secondary hyperparathyroidism of renal origin: Secondary | ICD-10-CM | POA: Diagnosis not present

## 2019-02-11 DIAGNOSIS — Z992 Dependence on renal dialysis: Secondary | ICD-10-CM | POA: Diagnosis not present

## 2019-02-11 DIAGNOSIS — N186 End stage renal disease: Secondary | ICD-10-CM | POA: Diagnosis not present

## 2019-02-13 DIAGNOSIS — E1129 Type 2 diabetes mellitus with other diabetic kidney complication: Secondary | ICD-10-CM | POA: Diagnosis not present

## 2019-02-13 DIAGNOSIS — N2581 Secondary hyperparathyroidism of renal origin: Secondary | ICD-10-CM | POA: Diagnosis not present

## 2019-02-13 DIAGNOSIS — Z23 Encounter for immunization: Secondary | ICD-10-CM | POA: Diagnosis not present

## 2019-02-13 DIAGNOSIS — N186 End stage renal disease: Secondary | ICD-10-CM | POA: Diagnosis not present

## 2019-02-13 DIAGNOSIS — Z992 Dependence on renal dialysis: Secondary | ICD-10-CM | POA: Diagnosis not present

## 2019-02-16 DIAGNOSIS — E1129 Type 2 diabetes mellitus with other diabetic kidney complication: Secondary | ICD-10-CM | POA: Diagnosis not present

## 2019-02-16 DIAGNOSIS — N2581 Secondary hyperparathyroidism of renal origin: Secondary | ICD-10-CM | POA: Diagnosis not present

## 2019-02-16 DIAGNOSIS — Z992 Dependence on renal dialysis: Secondary | ICD-10-CM | POA: Diagnosis not present

## 2019-02-16 DIAGNOSIS — Z23 Encounter for immunization: Secondary | ICD-10-CM | POA: Diagnosis not present

## 2019-02-16 DIAGNOSIS — N186 End stage renal disease: Secondary | ICD-10-CM | POA: Diagnosis not present

## 2019-02-19 DIAGNOSIS — N186 End stage renal disease: Secondary | ICD-10-CM | POA: Diagnosis not present

## 2019-02-19 DIAGNOSIS — Z992 Dependence on renal dialysis: Secondary | ICD-10-CM | POA: Diagnosis not present

## 2019-02-19 DIAGNOSIS — Z23 Encounter for immunization: Secondary | ICD-10-CM | POA: Diagnosis not present

## 2019-02-19 DIAGNOSIS — E1129 Type 2 diabetes mellitus with other diabetic kidney complication: Secondary | ICD-10-CM | POA: Diagnosis not present

## 2019-02-19 DIAGNOSIS — N2581 Secondary hyperparathyroidism of renal origin: Secondary | ICD-10-CM | POA: Diagnosis not present

## 2019-02-27 ENCOUNTER — Ambulatory Visit (INDEPENDENT_AMBULATORY_CARE_PROVIDER_SITE_OTHER): Payer: Medicare Other | Admitting: Pulmonary Disease

## 2019-02-27 ENCOUNTER — Encounter: Payer: Self-pay | Admitting: Pulmonary Disease

## 2019-02-27 ENCOUNTER — Other Ambulatory Visit: Payer: Self-pay

## 2019-02-27 VITALS — BP 132/84 | HR 87 | Temp 97.1°F | Wt 212.0 lb

## 2019-02-27 DIAGNOSIS — N186 End stage renal disease: Secondary | ICD-10-CM

## 2019-02-27 DIAGNOSIS — I739 Peripheral vascular disease, unspecified: Secondary | ICD-10-CM | POA: Diagnosis not present

## 2019-02-27 DIAGNOSIS — R0683 Snoring: Secondary | ICD-10-CM

## 2019-02-27 DIAGNOSIS — J9612 Chronic respiratory failure with hypercapnia: Secondary | ICD-10-CM

## 2019-02-27 NOTE — Progress Notes (Signed)
Synopsis: Referred in Dec 2020 for history of hypercapenic failure by Horald Pollen, *  Subjective:   PATIENT ID: Howard Bell GENDER: male DOB: Aug 25, 1965, MRN: [id number]  Chief Complaint  Patient presents with  . Consult    patient stated he is at his baseline. No SOB     PMH of DM, ESRD on dialysis MWF, admitted to ICU in June 2020, for elective AKA, post-op hypotensive, and hypercapenic failure, was intubated and transferred to the ICU.  Patient is a lifelong non-smoker.  No previous or prior lung disease.  He was a Airline pilot.  He has very strong upper extremities.  Unfortunately ended up on dialysis and has had bilateral AKA.  He is working to try to get prosthesis.  He was told however by his prosthesis company that he could not get fitted for prosthesis of the lower extremity until he had his oxygen requirement resolved.  I am unsure of of the reason behind this.  When he is off oxygen his O2 sats remained in the 90s.  He has a O2 sat monitor at home and is able to check this for himself.  He has had hypercarbic failure in the past required intubation but this seemed to be a related to at post anesthesia.  He does have a large neck and does snore according to his wife.  He does occasionally require oxygen if his volume is up with end-stage renal disease.  This is likely due to pulmonary edema.  And he knows when "I am too wet".    Past Medical History:  Diagnosis Date  . Arthritis   . Congestive heart failure (CHF) ([place])   . Depression   . 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 Diabetic gastroparesis ([place]) 11/06/2017  . ESRD (end stage renal disease) on dialysis [facility])    M,W.F dialysis  . H/O seasonal allergies   . Hypertension   . Pneumonia   . Sleep apnea    not using it now, on continuous O2 2 L during the day, 3 L during the night     Family History  Problem Relation Age of Onset  . Diabetes Mother   . Hypertension Father       Past Surgical History:  Procedure Laterality Date  . AMPUTATION Right 05/14/2018   Procedure: AMPUTATION BELOW KNEE;  Surgeon: Newt Minion, MD;  Location: [place];  Service: Orthopedics;  Laterality: Right;  . AMPUTATION Right 08/04/2018   Procedure: RIGHT ABOVE KNEE AMPUTATION;  Surgeon: Newt Minion, MD;  Location: [place];  Service: Orthopedics;  Laterality: Right;  . AMPUTATION Left 09/08/2018   Procedure: LEFT ABOVE KNEE AMPUTATION;  Surgeon: Newt Minion, MD;  Location: [place];  Service: Orthopedics;  Laterality: Left;  . APPLICATION OF WOUND VAC Left 09/08/2018   Procedure: Application Of Wound Vac;  Surgeon: Newt Minion, MD;  Location: [place];  Service: Orthopedics;  Laterality: Left;  . ESOPHAGOGASTRODUODENOSCOPY (EGD) WITH PROPOFOL N/A 12/05/2017   Procedure: ESOPHAGOGASTRODUODENOSCOPY (EGD) WITH PROPOFOL;  Surgeon: Doran Stabler, MD;  [facility];  Service: Gastroenterology;  Laterality: N/A;  . HERNIA REPAIR    . IR AV DIALY SHUNT INTRO NEEDLE/INTRACATH INITIAL W/PTA/IMG LEFT  03/30/2017    Social History   Socioeconomic History  . Marital status: Married    Spouse name: Not on file  . Number of children: Not on file  . Years of education: Not on file  .  Highest education level: Not on file  Occupational History  . Not on file  Social Needs  . Financial resource strain: Not on file  . Food insecurity    Worry: Not on file    Inability: Not on file  . Transportation needs    Medical: Not on file    Non-medical: Not on file  Tobacco Use  . Smoking status: Never Smoker  . Smokeless tobacco: Never Used  Substance and Sexual Activity  . Alcohol use: No  . Drug use: No  . Sexual activity: Not on file  Lifestyle  . Physical activity    Days per week: Not on file    Minutes per session: Not on file  . Stress: Not on file  Relationships  . Social Herbalist on phone: Not on file    Gets together: Not on file    Attends religious  service: Not on file    Active member of club or organization: Not on file    Attends meetings of clubs or organizations: Not on file    Relationship status: Not on file  . Intimate partner violence    Fear of current or ex partner: Not on file    Emotionally abused: Not on file    Physically abused: Not on file    Forced sexual activity: Not on file  Other Topics Concern  . Not on file  Social History Narrative  . Not on file     Allergies  Allergen Reactions  . Gabapentin Other (See Comments)    Altered mental status     Outpatient Medications Prior to Visit  Medication Sig Dispense Refill  . acetaminophen (TYLENOL) 325 MG tablet Take 1-2 tablets (325-650 mg total) by mouth every 4 (four) hours as needed for mild pain.    Marland Kitchen albuterol (PROVENTIL HFA;VENTOLIN HFA) 108 (90 Base) MCG/ACT inhaler Inhale 1 puff into the lungs every 4 (four) hours as needed for wheezing or shortness of breath.     Marland Kitchen amLODipine (NORVASC) 10 MG tablet Take 1 tablet (10 mg total) by mouth daily. 30 tablet 1  . brimonidine (ALPHAGAN) 0.2 % ophthalmic solution Place 1 drop into both eyes 2 (two) times daily.    Marland Kitchen docusate sodium (COLACE) 100 MG capsule Take 1 capsule (100 mg total) by mouth 2 (two) times daily. 60 capsule 1  . ferric citrate (AURYXIA) 1 GM 210 MG(Fe) tablet Take 1 tablet (210 mg total) by mouth 3 (three) times daily with meals. 90 tablet 1  . fluticasone (FLONASE) 50 MCG/ACT nasal spray Place 1 spray into both nostrils daily.    Marland Kitchen glucose blood test strip 1 each by Other route as needed for other. Use as instructed 100 each 11  . insulin lispro (ADMELOG SOLOSTAR) 100 UNIT/ML KwikPen 3 times a day (just before each meal), 6-6-4 units, and pen needles 3/day 15 mL 11  . Lancets (ACCU-CHEK SOFT TOUCH) lancets Use as instructed 100 each 12  . latanoprost (XALATAN) 0.005 % ophthalmic solution Place 1 drop into both eyes at bedtime.    . methocarbamol (ROBAXIN) 500 MG tablet Take by mouth.    .  metoCLOPramide (REGLAN) 5 MG tablet Take 1 tablet (5 mg total) by mouth 3 (three) times daily before meals. 90 tablet 0  . metoprolol succinate (TOPROL-XL) 50 MG 24 hr tablet Take 1 tablet (50 mg total) by mouth at bedtime. Take with a meal. 90 tablet 3  . multivitamin (RENA-VIT) TABS tablet Take  1 tablet by mouth at bedtime. 30 tablet 1  . omeprazole (PRILOSEC) 20 MG capsule Take 1 capsule (20 mg total) by mouth at bedtime. 90 capsule 3  . timolol (BETIMOL) 0.5 % ophthalmic solution Place 1 drop into both eyes 2 (two) times daily. 10 mL 1   No facility-administered medications prior to visit.     Review of Systems  Constitutional: Negative for chills, fever, malaise/fatigue and weight loss.  HENT: Negative for hearing loss, sore throat and tinnitus.   Eyes: Negative for blurred vision and double vision.  Respiratory: Negative for cough, hemoptysis, sputum production, shortness of breath, wheezing and stridor.   Cardiovascular: Negative for chest pain, palpitations, orthopnea, leg swelling and PND.  Gastrointestinal: Negative for abdominal pain, constipation, diarrhea, heartburn, nausea and vomiting.  Genitourinary: Negative for dysuria, hematuria and urgency.  Musculoskeletal: Negative for joint pain and myalgias.  Skin: Negative for itching and rash.  Neurological: Negative for dizziness, tingling, weakness and headaches.  Endo/Heme/Allergies: Negative for environmental allergies. Does not bruise/bleed easily.  Psychiatric/Behavioral: Negative for depression. The patient is not nervous/anxious and does not have insomnia.   All other systems reviewed and are negative.    Objective:  Physical Exam Vitals signs reviewed.  Constitutional:      General: He is not in acute distress.    Appearance: He is well-developed.  HENT:     Head: Normocephalic and atraumatic.  Eyes:     General: No scleral icterus.    Conjunctiva/sclera: Conjunctivae normal.     Pupils: Pupils are equal, round,  and reactive to light.  Neck:     Musculoskeletal: Neck supple.     Vascular: No JVD.     Trachea: No tracheal deviation.  Cardiovascular:     Rate and Rhythm: Normal rate and regular rhythm.     Heart sounds: Normal heart sounds. No murmur.  Pulmonary:     Effort: Pulmonary effort is normal. No tachypnea, accessory muscle usage or respiratory distress.     Breath sounds: Normal breath sounds. No stridor. No wheezing, rhonchi or rales.  Abdominal:     General: Bowel sounds are normal. There is no distension.     Palpations: Abdomen is soft.     Tenderness: There is no abdominal tenderness.  Musculoskeletal:        General: Deformity present. No tenderness.     Comments: Bilateral AKA Left ring finger amputation  Lymphadenopathy:     Cervical: No cervical adenopathy.  Skin:    General: Skin is warm and dry.     Capillary Refill: Capillary refill takes less than 2 seconds.     Findings: No rash.  Neurological:     Mental Status: He is alert and oriented to person, place, and time.  Psychiatric:        Behavior: Behavior normal.      Vitals:   02/27/19 1606  BP: 132/84  Pulse: 87  Temp: (!) 97.1 F (36.2 C)  TempSrc: Temporal  SpO2: 95%  Weight: 212 lb (96.2 kg)   95% on 2 L BMI Readings from Last 3 Encounters:  02/27/19 28.75 kg/m  01/30/19 27.12 kg/m  01/09/19 29.16 kg/m   Wt Readings from Last 3 Encounters:  02/27/19 212 lb (96.2 kg)  01/30/19 200 lb (90.7 kg)  01/09/19 215 lb (97.5 kg)     CBC    Component Value Date/Time   WBC 6.3 09/29/2018 0829   RBC 2.75 (L) 09/29/2018 0829   HGB 8.5 (L) 09/29/2018 [id number]  HGB 14.2 09/20/2017 1054   HCT 27.3 (L) 09/29/2018 0829   HCT 42.3 09/20/2017 1054   PLT 208 09/29/2018 0829   PLT 162 09/20/2017 1054   MCV 99.3 09/29/2018 0829   MCV 95 09/20/2017 1054   MCH 30.9 09/29/2018 0829   MCHC 31.1 09/29/2018 0829   RDW 17.3 (H) 09/29/2018 0829   RDW 15.9 (H) 09/20/2017 1054   LYMPHSABS 1.6 09/13/2018 0257    LYMPHSABS 1.5 09/20/2017 1054   MONOABS 0.9 09/13/2018 0257   EOSABS 0.4 09/13/2018 0257   EOSABS 0.1 09/20/2017 1054   BASOSABS 0.0 09/13/2018 0257   BASOSABS 0.0 09/20/2017 1054     Chest Imaging: June 2020: intubated in ICU  The patient's images have been independently reviewed by me.    Pulmonary Functions Testing Results: No flowsheet data found.  FeNO: None   Pathology: None   Echocardiogram:   08/2018:  1. The left ventricle has normal systolic function with an ejection fraction of 60-65%. The cavity size was normal. There is mildly increased left ventricular wall thickness. Left ventricular diastolic Doppler parameters are consistent with impaired  relaxation. There is right ventricular volume and pressure overload. No evidence of left ventricular regional wall motion abnormalities.  2. The right ventricle has normal systolic function. The cavity was normal. There is no increase in right ventricular wall thickness. Right ventricular systolic pressure is moderately elevated with an estimated pressure of 52.3 mmHg.  3. There is moderate mitral annular calcification present.  4. The aortic valve is tricuspid. Mild thickening of the aortic valve. Moderate calcification of the aortic valve.  Heart Catheterization:  None     Assessment & Plan:     ICD-10-CM   1. Chronic hypercapnic respiratory failure ([place])  J96.12 Ambulatory Referral for DME    Pulse oximetry, overnight  2. ESRD (end stage renal disease) ([place])  N18.6   3. PVD (peripheral vascular disease) ([facility])  I73.9   4. Snoring  R06.83     Discussion:  This is a 53 year old gentleman end-stage renal disease was placed on oxygen during hospitalization however has not had oxygen trial off at this time.  His O2 sats are fine today in the low 90s on room air.  He also has snoring and body habitus consistent with risk for sleep apnea.  Plan: We will refer to see one of our sleep docs to establish care and potential  need for home sleep study versus lab sleep study. As for his oxygen need we will check an overnight oximetry on room air. I asked him to discontinue his daily oxygen and monitor his home pulse oximetry to see if he needs oxygen. Patient was asked to restart oxygen therapy if his O2 sats dropped less than 88%. Patient states that he has been 3 or 4 days without oxygen before and not felt like he needed it and his O2 sats have been stable while checking it at home. Patient can return to see me as needed.  Likely better served to establish care with sleep physician pending sleep study results.  Greater than 50% of this patient's 30-minute office visit was been face-to-face discussing above recommendations and treatment plan.     Current Outpatient Medications:  .  acetaminophen (TYLENOL) 325 MG tablet, Take 1-2 tablets (325-650 mg total) by mouth every 4 (four) hours as needed for mild pain., Disp:  , Rfl:  .  albuterol (PROVENTIL HFA;VENTOLIN HFA) 108 (90 Base) MCG/ACT inhaler, Inhale 1 puff into the lungs every  4 (four) hours as needed for wheezing or shortness of breath. , Disp: , Rfl:  .  amLODipine (NORVASC) 10 MG tablet, Take 1 tablet (10 mg total) by mouth daily., Disp: 30 tablet, Rfl: 1 .  brimonidine (ALPHAGAN) 0.2 % ophthalmic solution, Place 1 drop into both eyes 2 (two) times daily., Disp: , Rfl:  .  docusate sodium (COLACE) 100 MG capsule, Take 1 capsule (100 mg total) by mouth 2 (two) times daily., Disp: 60 capsule, Rfl: 1 .  ferric citrate (AURYXIA) 1 GM 210 MG(Fe) tablet, Take 1 tablet (210 mg total) by mouth 3 (three) times daily with meals., Disp: 90 tablet, Rfl: 1 .  fluticasone (FLONASE) 50 MCG/ACT nasal spray, Place 1 spray into both nostrils daily., Disp: , Rfl:  .  glucose blood test strip, 1 each by Other route as needed for other. Use as instructed, Disp: 100 each, Rfl: 11 .  insulin lispro (ADMELOG SOLOSTAR) 100 UNIT/ML KwikPen, 3 times a day (just before each meal), 6-6-4  units, and pen needles 3/day, Disp: 15 mL, Rfl: 11 .  Lancets (ACCU-CHEK SOFT TOUCH) lancets, Use as instructed, Disp: 100 each, Rfl: 12 .  latanoprost (XALATAN) 0.005 % ophthalmic solution, Place 1 drop into both eyes at bedtime., Disp: , Rfl:  .  methocarbamol (ROBAXIN) 500 MG tablet, Take by mouth., Disp: , Rfl:  .  metoCLOPramide (REGLAN) 5 MG tablet, Take 1 tablet (5 mg total) by mouth 3 (three) times daily before meals., Disp: 90 tablet, Rfl: 0 .  metoprolol succinate (TOPROL-XL) 50 MG 24 hr tablet, Take 1 tablet (50 mg total) by mouth at bedtime. Take with a meal., Disp: 90 tablet, Rfl: 3 .  multivitamin (RENA-VIT) TABS tablet, Take 1 tablet by mouth at bedtime., Disp: 30 tablet, Rfl: 1 .  omeprazole (PRILOSEC) 20 MG capsule, Take 1 capsule (20 mg total) by mouth at bedtime., Disp: 90 capsule, Rfl: 3 .  timolol (BETIMOL) 0.5 % ophthalmic solution, Place 1 drop into both eyes 2 (two) times daily., Disp: 10 mL, Rfl: 1   Garner Nash, DO [facility] 02/27/2019 4:25 PM

## 2019-02-27 NOTE — Patient Instructions (Signed)
Thank you for visiting Dr. Valeta Harms at [facility]. Today we recommend the following:  Orders Placed This Encounter  Procedures  . Ambulatory Referral for DME   Check overnight pulse oximetry on room air.  Stop oxygen therapy and monitor at home with finger saturation monitor.  Restart oxygen therapy is O2 Sats less than 88%   Please set patient up with Dr. Ander Slade or Dr. Halford Chessman for next available sleep consultation.  Return if symptoms worsen or fail to improve.    Please do your part to reduce the spread of COVID-19.

## 2019-02-28 ENCOUNTER — Telehealth: Payer: Self-pay | Admitting: Pulmonary Disease

## 2019-02-28 NOTE — Telephone Encounter (Signed)
Noted. The order that was placed yesterday was for an oxygen carrying bag for the pt to be able to carry his oxygen to dialysis.  LMTCB x1 for pt to make him aware that he can use any kind of bag to carry his oxygen as [employer] will not provide him with one.

## 2019-03-01 NOTE — Telephone Encounter (Signed)
Spoke with the pt's spouse and notified that they need to reach out to [employer] to resolve payment issue before they will provide him anything else  She states she was already aware  Nothing further needed

## 2019-03-06 ENCOUNTER — Encounter: Payer: Self-pay | Admitting: Family

## 2019-03-06 ENCOUNTER — Ambulatory Visit (INDEPENDENT_AMBULATORY_CARE_PROVIDER_SITE_OTHER): Payer: Medicare Other | Admitting: Family

## 2019-03-06 ENCOUNTER — Encounter: Payer: Self-pay | Admitting: Pulmonary Disease

## 2019-03-06 ENCOUNTER — Ambulatory Visit (INDEPENDENT_AMBULATORY_CARE_PROVIDER_SITE_OTHER): Payer: Medicare Other | Admitting: Pulmonary Disease

## 2019-03-06 ENCOUNTER — Other Ambulatory Visit: Payer: Self-pay

## 2019-03-06 VITALS — BP 136/64 | HR 89 | Wt 215.0 lb

## 2019-03-06 VITALS — Ht 72.0 in | Wt 212.0 lb

## 2019-03-06 DIAGNOSIS — Z89611 Acquired absence of right leg above knee: Secondary | ICD-10-CM

## 2019-03-06 DIAGNOSIS — G4733 Obstructive sleep apnea (adult) (pediatric): Secondary | ICD-10-CM | POA: Diagnosis not present

## 2019-03-06 DIAGNOSIS — Z89612 Acquired absence of left leg above knee: Secondary | ICD-10-CM | POA: Diagnosis not present

## 2019-03-06 DIAGNOSIS — I70262 Atherosclerosis of native arteries of extremities with gangrene, left leg: Secondary | ICD-10-CM

## 2019-03-06 NOTE — Progress Notes (Signed)
Subjective:    Patient ID: Howard Bell, male    DOB: Jun 29, 1965, 53 y.o.   MRN: [id number]  Patient with a history of obstructive sleep apnea  She last used CPAP about 6 years ago Had his machine for about 8 years, did not have any difficulty tolerating CPAP in the past  Symptoms of daytime sleepiness Witnessed apneas, gasping respirations at night Dryness of her mouth in the morning  His memory is fine  No known family history of obstructive sleep apnea  Multiple comorbidities including bilateral lower extremity amputations Coronary artery disease, heart failure, diabetes, CVA, chronic kidney disease  Past Medical History:  Diagnosis Date  . Arthritis   . Congestive heart failure (CHF) ([place])   . Depression   . 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 Diabetic gastroparesis ([place]) 11/06/2017  . ESRD (end stage renal disease) on dialysis [facility])    M,W.F dialysis  . H/O seasonal allergies   . Hypertension   . Pneumonia   . Sleep apnea    not using it now, on continuous O2 2 L during the day, 3 L during the night   Social History   Socioeconomic History  . Marital status: Married    Spouse name: Not on file  . Number of children: Not on file  . Years of education: Not on file  . Highest education level: Not on file  Occupational History  . Not on file  Tobacco Use  . Smoking status: Never Smoker  . Smokeless tobacco: Never Used  Substance and Sexual Activity  . Alcohol use: No  . Drug use: No  . Sexual activity: Not on file  Other Topics Concern  . Not on file  Social History Narrative  . Not on file   Social Determinants of Health   Financial Resource Strain:   . Difficulty of Paying Living Expenses: Not on file  Food Insecurity:   . Worried About Charity fundraiser in the Last Year: Not on file  . Ran Out of Food in the Last Year: Not on file  Transportation Needs:   . Lack of Transportation (Medical): Not on file  .  Lack of Transportation (Non-Medical): Not on file  Physical Activity:   . Days of Exercise per Week: Not on file  . Minutes of Exercise per Session: Not on file  Stress:   . Feeling of Stress : Not on file  Social Connections:   . Frequency of Communication with Friends and Family: Not on file  . Frequency of Social Gatherings with Friends and Family: Not on file  . Attends Religious Services: Not on file  . Active Member of Clubs or Organizations: Not on file  . Attends Archivist Meetings: Not on file  . Marital Status: Not on file  Intimate Partner Violence:   . Fear of Current or Ex-Partner: Not on file  . Emotionally Abused: Not on file  . Physically Abused: Not on file  . Sexually Abused: Not on file   Family History  Problem Relation Age of Onset  . Diabetes Mother   . Hypertension Father    He is adopted   Review of Systems  Constitutional: Negative for fever and unexpected weight change.  HENT: Negative for congestion, dental problem, ear pain, nosebleeds, postnasal drip, rhinorrhea, sinus pressure, sneezing, sore throat and trouble swallowing.   Eyes: Negative for redness and itching.  Respiratory: Negative for cough, chest tightness, shortness of breath  and wheezing.   Cardiovascular: Negative for palpitations and leg swelling.  Gastrointestinal: Negative for nausea and vomiting.  Genitourinary: Negative for dysuria.  Musculoskeletal: Negative for joint swelling.  Skin: Negative for rash.  Allergic/Immunologic: Negative.  Negative for environmental allergies, food allergies and immunocompromised state.  Neurological: Negative for headaches.  Hematological: Does not bruise/bleed easily.  Psychiatric/Behavioral: Negative for dysphoric mood. The patient is not nervous/anxious.        Objective:   Physical Exam Constitutional:      General: He is not in acute distress.    Appearance: Normal appearance.  HENT:     Head: Normocephalic and atraumatic.       Nose: Nose normal. No congestion.     Mouth/Throat:     Mouth: Mucous membranes are dry.     Comments: Crowded oropharynx, Mallampati 4 Eyes:     Pupils: Pupils are equal, round, and reactive to light.  Cardiovascular:     Rate and Rhythm: Normal rate and regular rhythm.     Pulses: Normal pulses.     Heart sounds: Normal heart sounds. No murmur. No friction rub.  Pulmonary:     Effort: Pulmonary effort is normal. No respiratory distress.     Breath sounds: Normal breath sounds. No stridor. No wheezing or rhonchi.  Musculoskeletal:     Cervical back: Normal range of motion and neck supple. No rigidity.  Neurological:     General: No focal deficit present.     Mental Status: He is alert.  Psychiatric:        Mood and Affect: Mood normal.     Vitals:   03/06/19 1157  BP: 136/64  Pulse: 89  SpO2: 91%   Results of the Epworth flowsheet 03/06/2019  Sitting and reading 3  Watching TV 3  Sitting, inactive in a public place (e.g. a theatre or a meeting) 3  As a passenger in a car for an hour without a break 2  Lying down to rest in the afternoon when circumstances permit 3  Sitting and talking to someone 2  Sitting quietly after a lunch without alcohol 3  In a car, while stopped for a few minutes in traffic 0  Total score 19      Assessment & Plan:  .  History of obstructive sleep apnea with significant daytime symptoms .  Excessive daytime sleepiness .  History of hypercapnic respiratory failure .  History of hypoxemic respiratory failure .  End-stage renal disease .  Peripheral vascular disease .  Snoring  -Patient does have a prior history of obstructive sleep apnea, tolerated CPAP well in the past -Lost machine at some point Is having increasing symptoms at present  We will schedule the patient for a home sleep study  We will update him as results become available  Encouraged to continue to work to keep his weight in check  We will set him up with CPAP  once results are available  Pathophysiology of sleep disordered breathing discussed with the patient  Risk with nontreatment discussed  We will follow-up in 3 months

## 2019-03-06 NOTE — Patient Instructions (Signed)
History of obstructive sleep apnea  We will set you up for a home sleep study We will update you with results  Set you up with a CPAP once we have results  I will see you back in 3 months Sleep Apnea Sleep apnea is a condition in which breathing pauses or becomes shallow during sleep. Episodes of sleep apnea usually last 10 seconds or longer, and they may occur as many as 20 times an hour. Sleep apnea disrupts your sleep and keeps your body from getting the rest that it needs. This condition can increase your risk of certain health problems, including:  Heart attack.  Stroke.  Obesity.  Diabetes.  Heart failure.  Irregular heartbeat. What are the causes? There are three kinds of sleep apnea:  Obstructive sleep apnea. This kind is caused by a blocked or collapsed airway.  Central sleep apnea. This kind happens when the part of the brain that controls breathing does not send the correct signals to the muscles that control breathing.  Mixed sleep apnea. This is a combination of obstructive and central sleep apnea. The most common cause of this condition is a collapsed or blocked airway. An airway can collapse or become blocked if:  Your throat muscles are abnormally relaxed.  Your tongue and tonsils are larger than normal.  You are overweight.  Your airway is smaller than normal. What increases the risk? You are more likely to develop this condition if you:  Are overweight.  Smoke.  Have a smaller than normal airway.  Are elderly.  Are male.  Drink alcohol.  Take sedatives or tranquilizers.  Have a family history of sleep apnea. What are the signs or symptoms? Symptoms of this condition include:  Trouble staying asleep.  Daytime sleepiness and tiredness.  Irritability.  Loud snoring.  Morning headaches.  Trouble concentrating.  Forgetfulness.  Decreased interest in sex.  Unexplained sleepiness.  Mood swings.  Personality  changes.  Feelings of depression.  Waking up often during the night to urinate.  Dry mouth.  Sore throat. How is this diagnosed? This condition may be diagnosed with:  A medical history.  A physical exam.  A series of tests that are done while you are sleeping (sleep study). These tests are usually done in a sleep lab, but they may also be done at home. How is this treated? Treatment for this condition aims to restore normal breathing and to ease symptoms during sleep. It may involve managing health issues that can affect breathing, such as high blood pressure or obesity. Treatment may include:  Sleeping on your side.  Using a decongestant if you have nasal congestion.  Avoiding the use of depressants, including alcohol, sedatives, and narcotics.  Losing weight if you are overweight.  Making changes to your diet.  Quitting smoking.  Using a device to open your airway while you sleep, such as: ? An oral appliance. This is a custom-made mouthpiece that shifts your lower jaw forward. ? A continuous positive airway pressure (CPAP) device. This device blows air through a mask when you breathe out (exhale). ? A nasal expiratory positive airway pressure (EPAP) device. This device has valves that you put into each nostril. ? A bi-level positive airway pressure (BPAP) device. This device blows air through a mask when you breathe in (inhale) and breathe out (exhale).  Having surgery if other treatments do not work. During surgery, excess tissue is removed to create a wider airway. It is important to get treatment for sleep apnea.  Without treatment, this condition can lead to:  High blood pressure.  Coronary artery disease.  In men, an inability to achieve or maintain an erection (impotence).  Reduced thinking abilities. Follow these instructions at home: Lifestyle  Make any lifestyle changes that your health care provider recommends.  Eat a healthy, well-balanced  diet.  Take steps to lose weight if you are overweight.  Avoid using depressants, including alcohol, sedatives, and narcotics.  Do not use any products that contain nicotine or tobacco, such as cigarettes, e-cigarettes, and chewing tobacco. If you need help quitting, ask your health care provider. General instructions  Take over-the-counter and prescription medicines only as told by your health care provider.  If you were given a device to open your airway while you sleep, use it only as told by your health care provider.  If you are having surgery, make sure to tell your health care provider you have sleep apnea. You may need to bring your device with you.  Keep all follow-up visits as told by your health care provider. This is important. Contact a health care provider if:  The device that you received to open your airway during sleep is uncomfortable or does not seem to be working.  Your symptoms do not improve.  Your symptoms get worse. Get help right away if:  You develop: ? Chest pain. ? Shortness of breath. ? Discomfort in your back, arms, or stomach.  You have: ? Trouble speaking. ? Weakness on one side of your body. ? Drooping in your face. These symptoms may represent a serious problem that is an emergency. Do not wait to see if the symptoms will go away. Get medical help right away. Call your local emergency services (911 in the [place] Do not drive yourself to the hospital. Summary  Sleep apnea is a condition in which breathing pauses or becomes shallow during sleep.  The most common cause is a collapsed or blocked airway.  The goal of treatment is to restore normal breathing and to ease symptoms during sleep. This information is not intended to replace advice given to you by your health care provider. Make sure you discuss any questions you have with your health care provider. Document Released: 02/26/2002 Document Revised: 12/23/2017 Document Reviewed:  11/01/2017 Elsevier Patient Education  2020 [employer].

## 2019-03-06 NOTE — Progress Notes (Signed)
Office Visit Note   Patient: Howard Bell           Date of Birth: 1965-07-09           MRN: [id number] Visit Date: 03/06/2019              Requested by: Horald Pollen, MD [place],  [place] [id number] PCP: Horald Pollen, MD  Chief Complaint  Patient presents with  . Left Leg - Follow-up    09/08/18 left AKA      HPI: This is a pleasant 53 year old gentleman who is 6 months status post left above-knee amputation.  He is also status post right above-knee amputation after his last visit he was supposed to have prosthetics made unfortunately he was wearing oxygen and had some difficulty with shortness of breath and physical therapy did not feel he was ready for prosthetics.  He is had a work-up regarding his cardiovascular status and his lung status and is no longer wearing oxygen  Assessment & Plan: Visit Diagnoses:  1. Hx of AKA (above knee amputation), left ([place])   2. S/P AKA (above knee amputation) bilateral ([place])     Plan: He will be reevaluated by PT for the prosthetics he should follow-up with [place] in 2 months.  I spoke directly with his wife  Follow-Up Instructions: No follow-ups on file.   Ortho Exam  Patient is alert, oriented, no adenopathy, well-dressed, normal affect, normal respiratory effort. Bilateral AKA's incisions have completely healed there is no callusing no eschar no cellulitis    Imaging: No results found. No images are attached to the encounter.  Labs: Lab Results  Component Value Date   HGBA1C 7.3 (A) 01/30/2019   HGBA1C 5.6 11/14/2018   HGBA1C 6.4 (H) 09/10/2018   ESRSEDRATE >140 (H) 08/15/2018   CRP 20.5 (H) 08/15/2018   REPTSTATUS 08/20/2018 FINAL 08/15/2018   CULT  08/15/2018    NO GROWTH 5 DAYS Performed at [facility], [place] [address] [place], [place] [address]    LABORGA PROTEUS MIRABILIS 05/12/2018     Lab Results  Component Value Date   ALBUMIN 2.6 (L) 09/29/2018   ALBUMIN 2.5 (L)  09/27/2018   ALBUMIN 2.4 (L) 09/25/2018   PREALBUMIN 14.6 (L) 08/15/2018    Lab Results  Component Value Date   MG 2.1 09/08/2018   MG 2.0 08/17/2018   MG 2.8 (H) 03/30/2017   No results found for: [facility]  Lab Results  Component Value Date   PREALBUMIN 14.6 (L) 08/15/2018   CBC EXTENDED Latest Ref Rng & Units 09/29/2018 09/27/2018 09/25/2018  WBC 4.0 - 10.5 K/uL 6.3 7.6 7.0  RBC 4.22 - 5.81 MIL/uL 2.75(L) 2.62(L) 2.46(L)  HGB 13.0 - 17.0 g/dL 8.5(L) 8.4(L) 7.3(L)  HCT 39.0 - 52.0 % 27.3(L) 26.0(L) 24.3(L)  PLT 150 - 400 K/uL 208 207 233  NEUTROABS 1.7 - 7.7 K/uL - - -  LYMPHSABS 0.7 - 4.0 K/uL - - -     Body mass index is 28.75 kg/m.  Orders:  Orders Placed This Encounter  Procedures  . Ambulatory referral to Physical Therapy   No orders of the defined types were placed in this encounter.    Procedures: No procedures performed  Clinical Data: No additional findings.  ROS:  All other systems negative, except as noted in the HPI. Review of Systems  Objective: Vital Signs: Ht 6' (1.829 m)   Wt 212 lb (96.2 kg)   BMI 28.75 kg/m  Specialty Comments:  No specialty comments available.  PMFS History: Patient Active Problem List   Diagnosis Date Noted  . Abnormality of gait 01/30/2019  . S/P AKA (above knee amputation) bilateral ([place]) 11/07/2018  . Chronic hypercapnic respiratory failure ([place]) 10/02/2018  . OSA (obstructive sleep apnea)   . S/P AKA (above knee amputation) unilateral, right ([place])   . Diabetic peripheral neuropathy ([place])   . Status post bilateral above knee amputation ([place]) 09/08/2018  . Atherosclerosis of native arteries of extremities with gangrene, left leg ([place]) 09/05/2018  . Peripheral arterial disease ([place])   . Unilateral AKA, right ([place]) 08/17/2018  . Drug induced constipation   . Uncontrolled diabetes mellitus type 2 with peripheral artery disease ([place])   . Anemia of chronic disease   . Diabetes mellitus type 2 in nonobese (HCC)     . Supplemental oxygen dependent   . Unilateral AKA, left ([place]) 08/07/2018  . S/P BKA (below knee amputation), right ([place]) 08/04/2018  . Critical limb ischemia with history of revascularization of same extremity 05/09/2018  . 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 ([place]) 11/16/2017  . CKD stage 5 due to type 2 diabetes mellitus ([place]) 11/16/2017  . Glaucoma due to type 2 diabetes mellitus ([place]) 11/16/2017  . Chronic generalized pain 11/16/2017  . Recurrent pneumonia 11/06/2017  . Diabetic gastroparesis ([place]) 11/06/2017  . Generalized abdominal pain 09/13/2017  . Pain in right shoulder 07/26/2017  . ESRD on dialysis ([place]) 03/28/2017  . Anemia due to end stage renal disease ([place]) 08/04/2016   Past Medical History:  Diagnosis Date  . Arthritis   . Congestive heart failure (CHF) ([place])   . Depression   . 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 Diabetic gastroparesis ([place]) 11/06/2017  . ESRD (end stage renal disease) on dialysis [facility])    M,W.F dialysis  . H/O seasonal allergies   . Hypertension   . Pneumonia   . Sleep apnea    not using it now, on continuous O2 2 L during the day, 3 L during the night    Family History  Problem Relation Age of Onset  . Diabetes Mother   . Hypertension Father     Past Surgical History:  Procedure Laterality Date  . AMPUTATION Right 05/14/2018   Procedure: AMPUTATION BELOW KNEE;  Surgeon: Newt Minion, MD;  Location: [facility];  Service: Orthopedics;  Laterality: Right;  . AMPUTATION Right 08/04/2018   Procedure: RIGHT ABOVE KNEE AMPUTATION;  Surgeon: Newt Minion, MD;  Location: [place];  Service: Orthopedics;  Laterality: Right;  . AMPUTATION Left 09/08/2018   Procedure: LEFT ABOVE KNEE AMPUTATION;  Surgeon: Newt Minion, MD;  Location: [facility];  Service: Orthopedics;  Laterality: Left;  .  APPLICATION OF WOUND VAC Left 09/08/2018   Procedure: Application Of Wound Vac;  Surgeon: Newt Minion, MD;  Location: [facility];  Service: Orthopedics;  Laterality: Left;  . ESOPHAGOGASTRODUODENOSCOPY (EGD) WITH PROPOFOL N/A 12/05/2017   Procedure: ESOPHAGOGASTRODUODENOSCOPY (EGD) WITH PROPOFOL;  Surgeon: Doran Stabler, MD;  Location: [facility];  Service: Gastroenterology;  Laterality: N/A;  . HERNIA REPAIR    . IR AV DIALY SHUNT INTRO NEEDLE/INTRACATH INITIAL W/PTA/IMG LEFT  03/30/2017   Social History   Occupational History  . Not on file  Tobacco Use  . Smoking status: Never Smoker  . Smokeless  tobacco: Never Used  Substance and Sexual Activity  . Alcohol use: No  . Drug use: No  . Sexual activity: Not on file

## 2019-03-07 ENCOUNTER — Institutional Professional Consult (permissible substitution): Payer: Medicare Other | Admitting: Pulmonary Disease

## 2019-03-13 ENCOUNTER — Ambulatory Visit (INDEPENDENT_AMBULATORY_CARE_PROVIDER_SITE_OTHER): Payer: Medicare Other | Admitting: Physical Therapy

## 2019-03-13 ENCOUNTER — Other Ambulatory Visit: Payer: Self-pay

## 2019-03-13 ENCOUNTER — Encounter: Payer: Self-pay | Admitting: Physical Therapy

## 2019-03-13 VITALS — BP 176/108 | HR 104 | Resp 28

## 2019-03-13 DIAGNOSIS — M25652 Stiffness of left hip, not elsewhere classified: Secondary | ICD-10-CM | POA: Diagnosis not present

## 2019-03-13 DIAGNOSIS — R293 Abnormal posture: Secondary | ICD-10-CM

## 2019-03-13 DIAGNOSIS — M6281 Muscle weakness (generalized): Secondary | ICD-10-CM

## 2019-03-13 DIAGNOSIS — M25651 Stiffness of right hip, not elsewhere classified: Secondary | ICD-10-CM | POA: Diagnosis not present

## 2019-03-13 DIAGNOSIS — R0902 Hypoxemia: Secondary | ICD-10-CM

## 2019-03-13 DIAGNOSIS — Z9181 History of falling: Secondary | ICD-10-CM

## 2019-03-14 NOTE — Therapy (Signed)
[facility] [address] [place], [place], [address] Phone: [phone]   Fax:  [phone]  Physical Therapy Evaluation  Patient Details  Name: Howard Bell MRN: [id number] Date of Birth: 10-17-1965 Referring Provider (PT): Dondra Prader, [place]   Encounter Date: 03/13/2019  PT End of Session - 03/13/19 1354    Visit Number  1    Number of Visits  1    PT Start Time  [id number]    PT Stop Time  1400    PT Time Calculation (min)  45 min    Activity Tolerance  Patient tolerated treatment well;Patient limited by fatigue   limited by oxygen desaturation with activity & dyspnea   Behavior During Therapy  [facility] for tasks assessed/performed       Past Medical History:  Diagnosis Date  . Arthritis   . Congestive heart failure (CHF) ([place])   . Depression   . 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 Diabetic gastroparesis ([place]) 11/06/2017  . ESRD (end stage renal disease) on dialysis [facility])    M,W.F dialysis  . H/O seasonal allergies   . Hypertension   . Pneumonia   . Sleep apnea    not using it now, on continuous O2 2 L during the day, 3 L during the night    Past Surgical History:  Procedure Laterality Date  . AMPUTATION Right 05/14/2018   Procedure: AMPUTATION BELOW KNEE;  Surgeon: Newt Minion, MD;  Location: [place];  Service: Orthopedics;  Laterality: Right;  . AMPUTATION Right 08/04/2018   Procedure: RIGHT ABOVE KNEE AMPUTATION;  Surgeon: Newt Minion, MD;  Location: [place];  Service: Orthopedics;  Laterality: Right;  . AMPUTATION Left 09/08/2018   Procedure: LEFT ABOVE KNEE AMPUTATION;  Surgeon: Newt Minion, MD;  Location: [place];  Service: Orthopedics;  Laterality: Left;  . APPLICATION OF WOUND VAC Left 09/08/2018   Procedure: Application Of Wound Vac;  Surgeon: Newt Minion, MD;  Location: [place];  Service: Orthopedics;  Laterality: Left;  . ESOPHAGOGASTRODUODENOSCOPY (EGD) WITH PROPOFOL N/A 12/05/2017   Procedure: ESOPHAGOGASTRODUODENOSCOPY (EGD) WITH PROPOFOL;  Surgeon: Doran Stabler, MD;  Location: [facility];  Service: Gastroenterology;  Laterality: N/A;  . HERNIA REPAIR    . IR AV DIALY SHUNT INTRO NEEDLE/INTRACATH INITIAL W/PTA/IMG LEFT  03/30/2017    Vitals:   03/13/19 1328 03/13/19 1339 03/13/19 1340 03/13/19 1342  BP: (!) 176/108     Pulse: (!) 102 (!) 115 (!) 107 (!) 104  Resp:  (!) 28    SpO2: 94% (!) 70% (!) 82% 93%    At rest prior to activity  Bed mobility  w/c dips After 3 minutes pursed lip breathing   Subjective Assessment - 03/13/19 1322    Subjective  This 53yo male was referred on 03/06/2019 by Dondra Prader, PA for reevaluation for prostheses with bilateral Transfemoral Amputations & dialysis. He underwent a right Transfemoral Amputation on 08/14/2018 & Left Transfemoral Amputation on 09/18/2018. He was on oxygen from surgery until last month. PT evaluation 12/05/2018 with recommmendation no prostheses due to O2 desaturation w/activity & hip weakness/ROM issues. He states SPO2% was 92% yesterday and the lowest is got was 83%. He was unsure what he was doing that could have affected his levels, states SPO2% returned to baseline quickly.    Pertinent History  Bil TFAs, DM2, CKD w/ESRD, neuropathy, retinopathy, OSA with chronic hypoxic respiratory failure, HTN, depression    Limitations  Standing;Walking  Patient Stated Goals  wants to get bilateral prostheses    Currently in Pain?  No/denies         [facility] PT Assessment - 03/13/19 1329      Assessment   Medical Diagnosis  Bilateral Transfemoral Amputations    Referring Provider (PT)  Dondra Prader, PA    Onset Date/Surgical Date  03/06/19   referral date to PT   Hand Dominance  Right    Prior Therapy  Prior PT evaluation on 12/05/18 with recommendation no prosthesis d/t O2 desat with activity & hip weakness/ROM issues       Precautions   Precautions  Fall    Precaution Comments  No BP LUE       Balance Screen    Has the patient fallen in the past 6 months  No    Has the patient had a decrease in activity level because of a fear of falling?   No    Is the patient reluctant to leave their home because of a fear of falling?   No      Home Environment   Living Environment  Private residence    Living Arrangements  Spouse/significant other;Children   15yo dtr lives at home,  57yo lives in area   Type of [place]  Other (comment)   [place] to go around 2 steps   [place]  One level    Horticulturist, commercial scooter;Wheelchair - [employer] - 2 wheels;Bedside commode;Tub bench   suction grab bar     Prior Function   Level of Independence  Independent;Independent with household mobility without device;Independent with community mobility without device    Vocation  On disability    Leisure  watch movies, gun range, travel       Posture/Postural Control   Posture/Postural Control  Postural limitations    Postural Limitations  Rounded Shoulders;Forward head;Increased lumbar lordosis    Posture Comments  Sitting posture without UE support       ROM / Strength   AROM / PROM / Strength  PROM;Strength      PROM   Overall PROM Comments  Hip extension L = -19* R = -23* Thomas position to control lordosis   12/05/2018 was L = -18* R = -22*     Strength   Overall Strength  Deficits    Overall Strength Comments  Pt performed 10 w/c dips with hands on wheels at hip height (which has UE motion similar to walker use if standing). Assessed vitals after: initially HR=109 SPO2=83%. After 3 minutes of pursed lip breathing HR = 104, SPO2 = 90%.    Strength Assessment Site  Hip    Right/Left Hip  Right;Left    Right Hip Extension  3-/5    Right Hip ABduction  4/5    Left Hip Extension  3-/5    Left Hip ABduction  4/5      Bed Mobility   Bed Mobility  Rolling Right;Rolling Left;Supine to Sit;Sit to Supine    Rolling Right  Minimal Assistance - Patient > 75%    Rolling Left   Minimal Assistance - Patient > 75%    Supine to Sit  Minimal Assistance - Patient > 75%   pulling on PT arm similar to trapeze on bed   Sit to Supine  Contact Guard/Touching assist   simulated trapeze for control     Transfers   Transfers  Anterior-Posterior Transfer  mat <> w/c   Anterior-Posterior Transfer  6: Modified independent (Device/Increase time);To level surface    Sit to Supine  --      Prosthetics Assessment - 03/13/19 1332      Prosthetics   Prosthetic Care Dependent with  Skin check;Residual limb care;Care of non-amputated limb;Prosthetic cleaning;Ply sock cleaning;Correct ply sock adjustment;Proper wear schedule/adjustment;Proper weight-bearing schedule/adjustment    Residual limb condition   good hair growth, normal coloration, distally around scar darkened color, dry skin especially around scar, scar no adhered, cylindrical shape    Bilaterally              Objective measurements completed on examination: See above findings.              PT Education - 03/13/19 1324    Education Details  Long discussion /education on energy expenditure requirements with bilateral Transfemoral Prostheses. If he desaturates with basic bed mobility, minimal MMT and 10 w/c dips he does not tolerate activities. Standing & gait with prostheses even Foreshortened (Stubbies) would be significant more energy requirement.    Person(s) Educated  Patient    Methods  Explanation;Verbal cues    Comprehension  Verbalized understanding                  Plan - 03/13/19 1356    Clinical Impression Statement  This patient still does not appear to be a good candidate for bilateral Transfemoral prostheses even Foreshortened (Stubbies) version.  He had low saturation at rest and declines with basic activities like bed mobility & MMT 4 muscle groups. He was able to improve oxygen above 90% with pursed lip breathing for 3 minutes. He performed 10 reps of basic w/c dips to  simulate UE activity similar to walker use in gait & oxygen desaturated to 70%. He has bilateral hip flexion contractures and bilateral hip weakness that would also limit his ability to use prostheses.  Patient does not appear to have physiological ability to exert energy that would be required for bilateral Transfemoral level prostheses. Patient may benefit from pulmonary rehab program to build activity tolerance.    Personal Factors and Comorbidities  Comorbidity 3+    Comorbidities  Bil TFAs, DM2, CKD w/ESRD, neuropathy, retinopathy, OSA with chronic hypoxic respiratory failure, HTN, depression    Examination-Activity Limitations  Bed Mobility;Reach Overhead;Toileting;Transfers    Stability/Clinical Decision Making  Evolving/Moderate complexity    Clinical Decision Making  Moderate    PT Next Visit Plan  Evaluation only    Recommended Other Services  Pulmonary Rehab may help improve activitiy tolerance.    Consulted and Agree with Plan of Care  Patient       Patient will benefit from skilled therapeutic intervention in order to improve the following deficits and impairments:  Cardiopulmonary status limiting activity, Decreased activity tolerance, Decreased balance, Decreased endurance, Decreased mobility, Decreased range of motion, Decreased strength, Postural dysfunction  Visit Diagnosis: Stiffness of right hip, not elsewhere classified  Stiffness of left hip, not elsewhere classified  Muscle weakness (generalized)  Abnormal posture  History of falling  Oxygen desaturation     Problem List Patient Active Problem List   Diagnosis Date Noted  . Abnormality of gait 01/30/2019  . S/P AKA (above knee amputation) bilateral ([place]) 11/07/2018  . Chronic hypercapnic respiratory failure ([place]) 10/02/2018  . OSA (obstructive sleep apnea)   . S/P AKA (above knee amputation) unilateral, right ([place])   . Diabetic peripheral neuropathy ([place])   . Status post bilateral above knee  amputation  ([place]) 09/08/2018  . Atherosclerosis of native arteries of extremities with gangrene, left leg ([place]) 09/05/2018  . Peripheral arterial disease ([place])   . Unilateral AKA, right ([place]) 08/17/2018  . Drug induced constipation   . Uncontrolled diabetes mellitus type 2 with peripheral artery disease ([place])   . Anemia of chronic disease   . Diabetes mellitus type 2 in nonobese (HCC)   . Supplemental oxygen dependent   . Unilateral AKA, left ([place]) 08/07/2018  . S/P BKA (below knee amputation), right ([place]) 08/04/2018  . Critical limb ischemia with history of revascularization of same extremity 05/09/2018  . 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 ([place]) 11/16/2017  . CKD stage 5 due to type 2 diabetes mellitus ([place]) 11/16/2017  . Glaucoma due to type 2 diabetes mellitus ([place]) 11/16/2017  . Chronic generalized pain 11/16/2017  . Recurrent pneumonia 11/06/2017  . Diabetic gastroparesis ([place]) 11/06/2017  . Generalized abdominal pain 09/13/2017  . Pain in right shoulder 07/26/2017  . ESRD on dialysis ([place]) 03/28/2017  . Anemia due to end stage renal disease ([place]) 08/04/2016    Jamey Reas PT, DPT 03/14/2019, 8:27 PM  [facility] [address] [place], [place], [address] Phone: [phone]   Fax:  [phone]  Name: Agee Serafini MRN: [id number] Date of Birth: [birth year]

## 2019-03-22 ENCOUNTER — Other Ambulatory Visit: Payer: Self-pay | Admitting: Pulmonary Disease

## 2019-03-22 DIAGNOSIS — G4733 Obstructive sleep apnea (adult) (pediatric): Secondary | ICD-10-CM

## 2019-03-26 ENCOUNTER — Telehealth: Payer: Self-pay

## 2019-03-26 DIAGNOSIS — J9612 Chronic respiratory failure with hypercapnia: Secondary | ICD-10-CM

## 2019-03-26 NOTE — Telephone Encounter (Signed)
-----   Message from Laurin Coder, MD sent at 03/22/2019  2:27 PM EST ----- Can we get an order to [employer] for oxygen at 4L with an ONO to ascertain adequacy to [employer]  I am not in the office  Any provider in the office can help sign it if a physical signature is needed, I am in the hospital  Jolee Ewing from [facility] is contact I believe  [employer] me if any other questions

## 2019-03-26 NOTE — Telephone Encounter (Signed)
Happy to help. I am not seeing the order pended though.   Feel free to place in my name to help AO out.   Aaron Edelman

## 2019-03-26 NOTE — Telephone Encounter (Signed)
Orders for O2 and ONO have been pended.   Aaron Edelman, please advise if you are ok with signing these orders since AO nor BI are in office. Thanks!

## 2019-03-27 ENCOUNTER — Telehealth: Payer: Self-pay | Admitting: Pulmonary Disease

## 2019-03-27 DIAGNOSIS — J9612 Chronic respiratory failure with hypercapnia: Secondary | ICD-10-CM

## 2019-03-27 NOTE — Telephone Encounter (Signed)
A second ono order was placed today and I sent it in.  Just got a message from [facility] that it is still incorrect.

## 2019-03-27 NOTE — Telephone Encounter (Signed)
Spoke with Melissa at [facility], states that ONO order placed yesterday did not state if it was needed on room air or on O2.  A new order with proper documentation is being requested. New order placed, outlining pt to be on 4lpm qhs as specified by AO.  Nothing further needed at this time- will close encounter.

## 2019-03-27 NOTE — Telephone Encounter (Signed)
Left message for Howard Bell to call us back. What exactly needs to be on the ONO order?

## 2019-03-27 NOTE — Telephone Encounter (Signed)
Speaking with Melissa on the phone:  (1) Are we leaving the patient on 3lpm during the day and putting him on 4lpm nocturnally, and then doing the ono on 4lpm noct  OR   (2) Are we changing him from 3lpm during the day to being on 4lpm at night ONLY? If we are changing him from continuous to nocturnal oxygen he will have to have a titrated sleep study.   Dr Ander Slade please advise, this has been a revolving issue for 3 weeks.  Thank you!!!  Will route to Dr Jenetta Downer and Lattie Haw T to ensure resolution.  Please call Melissa with Dr Colman Cater response, thanks!

## 2019-03-27 NOTE — Telephone Encounter (Signed)
Orders have been placed. Will close this encounter.  

## 2019-03-28 NOTE — Telephone Encounter (Signed)
Oxygen at 4 L at night, leave on 3 L during the day  ONO 4 L of oxygen  This is only until patient is able to get his sleep study-if sleep study is in the next day or 2 then this does not need to be done  This will change following patient's sleep study

## 2019-03-28 NOTE — Telephone Encounter (Signed)
Spoke with the [facility] and notified of recs per Dr Ander Slade  I placed new DME orders

## 2019-03-30 ENCOUNTER — Other Ambulatory Visit: Payer: Self-pay

## 2019-04-03 ENCOUNTER — Ambulatory Visit: Payer: Medicare Other | Admitting: Endocrinology

## 2019-04-10 ENCOUNTER — Encounter: Payer: Self-pay | Admitting: Vascular Surgery

## 2019-04-10 ENCOUNTER — Other Ambulatory Visit: Payer: Self-pay

## 2019-04-10 ENCOUNTER — Ambulatory Visit (INDEPENDENT_AMBULATORY_CARE_PROVIDER_SITE_OTHER): Payer: Self-pay | Admitting: Vascular Surgery

## 2019-04-10 VITALS — BP 157/91 | HR 98 | Temp 97.2°F | Resp 20 | Ht 72.0 in | Wt 215.0 lb

## 2019-04-10 DIAGNOSIS — N186 End stage renal disease: Secondary | ICD-10-CM

## 2019-04-10 DIAGNOSIS — Z992 Dependence on renal dialysis: Secondary | ICD-10-CM

## 2019-04-10 NOTE — Progress Notes (Signed)
Vascular and Vein Specialist of [facility]  Patient name: Howard Bell MRN: [id number] DOB: [birth year] Sex: male  REASON FOR CONSULT: Evaluation of left upper arm AV graft  HPI: Howard Bell is a 54 y.o. male, who is here today for evaluation of left upper arm AV graft.  He reports that this is been used without difficulty.  He has undergone prior intervention at [facility].  I have report of a recent intervention from 03/08/2019.  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  He does have some pseudoaneurysm formation in his left arm graft and is being evaluated for this today.  The patient had successful hemodialysis yesterday.  He reports that he is not having any difficulty with flow.  He has had no difficulty with ulceration of his graft and no pain associated with his graft.  Past Medical History:  Diagnosis Date  . Arthritis   . Congestive heart failure (CHF) ([place])   . Depression   . 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 Diabetic gastroparesis ([place]) 11/06/2017  . ESRD (end stage renal disease) on dialysis [facility])    M,W.F dialysis  . H/O seasonal allergies   . Hypertension   . Pneumonia   . Sleep apnea    not using it now, on continuous O2 2 L during the day, 3 L during the night    Family History  Problem Relation Age of Onset  . Diabetes Mother   . Hypertension Father     SOCIAL HISTORY: Social History   Socioeconomic History  . Marital status: Married    Spouse name: Not on file  . Number of children: Not on file  . Years of education: Not on file  . Highest education level: Not on file  Occupational History  . Not on file  Tobacco Use  . Smoking status: Never Smoker  . Smokeless tobacco: Never Used  Substance and Sexual Activity  . Alcohol use: No  . Drug use: No  . Sexual activity: Not on file  Other Topics Concern  .  Not on file  Social History Narrative  . Not on file   Social Determinants of Health   Financial Resource Strain:   . Difficulty of Paying Living Expenses: Not on file  Food Insecurity:   . Worried About Charity fundraiser in the Last Year: Not on file  . Ran Out of Food in the Last Year: Not on file  Transportation Needs:   . Lack of Transportation (Medical): Not on file  . Lack of Transportation (Non-Medical): Not on file  Physical Activity:   . Days of Exercise per Week: Not on file  . Minutes of Exercise per Session: Not on file  Stress:   . Feeling of Stress : Not on file  Social Connections:   . Frequency of Communication with Friends and Family: Not on file  . Frequency of Social Gatherings with Friends and Family: Not on file  . Attends Religious Services: Not on file  . Active Member of Clubs or Organizations: Not on file  . Attends Archivist Meetings: Not on file  . Marital Status: Not on file  Intimate Partner Violence:   . Fear of Current or Ex-Partner: Not on file  . Emotionally Abused: Not on file  . Physically Abused: Not on file  . Sexually Abused: Not on file    Allergies  Allergen Reactions  . Gabapentin Other (See Comments)    Altered mental status    Current Outpatient Medications  Medication Sig Dispense Refill  . acetaminophen (TYLENOL) 325 MG tablet Take 1-2 tablets (325-650 mg total) by mouth every 4 (four) hours as needed for mild pain.    Marland Kitchen albuterol (PROVENTIL HFA;VENTOLIN HFA) 108 (90 Base) MCG/ACT inhaler Inhale 1 puff into the lungs every 4 (four) hours as needed for wheezing or shortness of breath.     Marland Kitchen amLODipine (NORVASC) 10 MG tablet Take 1 tablet (10 mg total) by mouth daily. 30 tablet 1  . brimonidine (ALPHAGAN) 0.2 % ophthalmic solution Place 1 drop into both eyes 2 (two) times daily.    Marland Kitchen docusate sodium (COLACE) 100 MG capsule Take 1 capsule (100 mg total) by mouth 2 (two) times daily. 60 capsule 1  . Etelcalcetide HCl  (PARSABIV IV) Etelcalcetide (Parsabiv)    . ferric citrate (AURYXIA) 1 GM 210 MG(Fe) tablet Take 1 tablet (210 mg total) by mouth 3 (three) times daily with meals. 90 tablet 1  . fluticasone (FLONASE) 50 MCG/ACT nasal spray Place 1 spray into both nostrils daily.    Marland Kitchen glucose blood test strip 1 each by Other route as needed for other. Use as instructed 100 each 11  . insulin lispro (ADMELOG SOLOSTAR) 100 UNIT/ML KwikPen 3 times a day (just before each meal), 6-6-4 units, and pen needles 3/day 15 mL 11  . Lancets (ACCU-CHEK SOFT TOUCH) lancets Use as instructed 100 each 12  . latanoprost (XALATAN) 0.005 % ophthalmic solution Place 1 drop into both eyes at bedtime.    . methocarbamol (ROBAXIN) 500 MG tablet Take by mouth.    . metoCLOPramide (REGLAN) 5 MG tablet Take 1 tablet (5 mg total) by mouth 3 (three) times daily before meals. 90 tablet 0  . metoprolol succinate (TOPROL-XL) 50 MG 24 hr tablet Take 1 tablet (50 mg total) by mouth at bedtime. Take with a meal. 90 tablet 3  . multivitamin (RENA-VIT) TABS tablet Take 1 tablet by mouth at bedtime. 30 tablet 1  . omeprazole (PRILOSEC) 20 MG capsule Take 1 capsule (20 mg total) by mouth at bedtime. 90 capsule 3  . timolol (BETIMOL) 0.5 % ophthalmic solution Place 1 drop into both eyes 2 (two) times daily. 10 mL 1   No current facility-administered medications for this visit.    REVIEW OF SYSTEMS:  [X]  denotes positive finding, [ ]  denotes negative finding Cardiac  Comments:  Chest pain or chest pressure:    Shortness of breath upon exertion:    Short of breath when lying flat:    Irregular heart rhythm:        Vascular    Pain in calf, thigh, or hip brought on by ambulation:    Pain in feet at night that wakes you up from your sleep:     Blood clot in your veins:    Leg swelling:         Pulmonary    Oxygen at home:    Productive cough:     Wheezing:         Neurologic    Sudden weakness in arms or legs:     Sudden numbness in arms  or legs:     Sudden onset of difficulty speaking or slurred speech:    Temporary loss of vision in one eye:     Problems with dizziness:         Gastrointestinal  Blood in stool:     Vomited blood:         Genitourinary    Burning when urinating:     Blood in urine:        Psychiatric    Major depression:         Hematologic    Bleeding problems:    Problems with blood clotting too easily:        Skin    Rashes or ulcers:        Constitutional    Fever or chills:      PHYSICAL EXAM: Vitals:   04/10/19 0927  BP: (!) 157/91  Pulse: 98  Resp: 20  Temp: (!) 97.2 F (36.2 C)  SpO2: 92%  Weight: 215 lb (97.5 kg)  Height: 6' (1.829 m)    GENERAL: The patient is a well-nourished male, in no acute distress. The vital signs are documented above. CARDIOVASCULAR: Palpable radial pulses.  He does have an old nonfunctional graft on the more lateral aspect of his left upper arm and the graft that is currently being used on the more medial aspect.  There is diffuse aneurysmal change throughout the graft but no large aneurysms.  The skin is completely intact over the graft. PULMONARY: There is good air exchange  ABDOMEN: Soft and non-tender  MUSCULOSKELETAL: Bilateral above-knee amputations NEUROLOGIC: No focal weakness or paresthesias are detected. SKIN: There are no ulcers or rashes noted. PSYCHIATRIC: The patient has a normal affect.  DATA:  Report of intervention from [facility] reviewed from 03/08/2019  MEDICAL ISSUES: I discussed options with the patient.  I do not feel that even staged replacement of his graft would allow him to keep a functional portion.  He does have a relatively short graft and if we excluded half of this for revision feel that he would not have a functional graft.  I would recommend continued use of the graft and if it fails, would recommend attempted intervention keep it patent and if not he could have a new left upper arm graft as long as this did  not involve the stented aspect and his axilla.  He understands this plan and will see [place] again on an as-needed basis   Rosetta Posner, MD [facility] Vascular and Vein Specialists of [facility] Tel [phone] Pager [phone]

## 2019-04-16 ENCOUNTER — Telehealth: Payer: Self-pay | Admitting: Pulmonary Disease

## 2019-04-16 NOTE — Telephone Encounter (Signed)
Spoke with wife.  She was confused on the sleep study and the ONO Clarified sleep study has not been done , and that we would call and make an appointment to set that up.  She voiced understanding, nothing further needed at this time

## 2019-04-17 ENCOUNTER — Other Ambulatory Visit: Payer: Self-pay

## 2019-04-17 ENCOUNTER — Ambulatory Visit (INDEPENDENT_AMBULATORY_CARE_PROVIDER_SITE_OTHER): Payer: 59 | Admitting: Endocrinology

## 2019-04-17 ENCOUNTER — Encounter: Payer: Self-pay | Admitting: Endocrinology

## 2019-04-17 VITALS — BP 122/76 | HR 105 | Wt 202.0 lb

## 2019-04-17 DIAGNOSIS — E119 Type 2 diabetes mellitus without complications: Secondary | ICD-10-CM

## 2019-04-17 LAB — POCT GLYCOSYLATED HEMOGLOBIN (HGB A1C): Hemoglobin A1C: 7.1 % — AB (ref 4.0–5.6)

## 2019-04-17 NOTE — Progress Notes (Signed)
Subjective:    Patient ID: Howard Bell, male    DOB: [birth year], 54 y.o.   MRN: [id number]  HPI Pt returns for f/u of diabetes mellitus: DM type: 1 Dx'ed: [id number] Complications: polyneuropathy and ESRD (HD).  Therapy: insulin since soon after dx.  DKA: last episode was 2003 Severe hypoglycemia: last episode was in 2008 Pancreatitis: never Pancreatic imaging: normal on 2019 CT Other: he takes multiple daily injections; he does not need basal insulin; fructosamine shows much worse glycemic control than A1c.   Interval history: no cbg record, but states cbg's vary from 130-180.  It is in general lowest fasting.   Past Medical History:  Diagnosis Date  . Arthritis   . Congestive heart failure (CHF) ([place])   . Depression   . 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 Diabetic gastroparesis ([place]) 11/06/2017  . ESRD (end stage renal disease) on dialysis [facility])    M,W.F dialysis  . H/O seasonal allergies   . Hypertension   . Pneumonia   . Sleep apnea    not using it now, on continuous O2 2 L during the day, 3 L during the night    Past Surgical History:  Procedure Laterality Date  . AMPUTATION Right 05/14/2018   Procedure: AMPUTATION BELOW KNEE;  Surgeon: Newt Minion, MD;  Location: [place];  Service: Orthopedics;  Laterality: Right;  . AMPUTATION Right 08/04/2018   Procedure: RIGHT ABOVE KNEE AMPUTATION;  Surgeon: Newt Minion, MD;  Location: [place];  Service: Orthopedics;  Laterality: Right;  . AMPUTATION Left 09/08/2018   Procedure: LEFT ABOVE KNEE AMPUTATION;  Surgeon: Newt Minion, MD;  Location: [place];  Service: Orthopedics;  Laterality: Left;  . APPLICATION OF WOUND VAC Left 09/08/2018   Procedure: Application Of Wound Vac;  Surgeon: Newt Minion, MD;  Location: [place];  Service: Orthopedics;  Laterality: Left;  . ESOPHAGOGASTRODUODENOSCOPY (EGD) WITH PROPOFOL N/A 12/05/2017   Procedure: ESOPHAGOGASTRODUODENOSCOPY (EGD) WITH PROPOFOL;  Surgeon:  Doran Stabler, MD;  Location: [facility];  Service: Gastroenterology;  Laterality: N/A;  . HERNIA REPAIR    . IR AV DIALY SHUNT INTRO NEEDLE/INTRACATH INITIAL W/PTA/IMG LEFT  03/30/2017    Social History   Socioeconomic History  . Marital status: Married    Spouse name: Not on file  . Number of children: Not on file  . Years of education: Not on file  . Highest education level: Not on file  Occupational History  . Not on file  Tobacco Use  . Smoking status: Never Smoker  . Smokeless tobacco: Never Used  Substance and Sexual Activity  . Alcohol use: No  . Drug use: No  . Sexual activity: Not on file  Other Topics Concern  . Not on file  Social History Narrative  . Not on file   Social Determinants of Health   Financial Resource Strain:   . Difficulty of Paying Living Expenses: Not on file  Food Insecurity:   . Worried About Charity fundraiser in the Last Year: Not on file  . Ran Out of Food in the Last Year: Not on file  Transportation Needs:   . Lack of Transportation (Medical): Not on file  . Lack of Transportation (Non-Medical): Not on file  Physical Activity:   . Days of Exercise per Week: Not on file  . Minutes of Exercise per Session: Not on file  Stress:   . Feeling of Stress : Not on file  Social Connections:   . Frequency of Communication with Friends and Family: Not on file  . Frequency of Social Gatherings with Friends and Family: Not on file  . Attends Religious Services: Not on file  . Active Member of Clubs or Organizations: Not on file  . Attends Archivist Meetings: Not on file  . Marital Status: Not on file  Intimate Partner Violence:   . Fear of Current or Ex-Partner: Not on file  . Emotionally Abused: Not on file  . Physically Abused: Not on file  . Sexually Abused: Not on file    Current Outpatient Medications on File Prior to Visit  Medication Sig Dispense Refill  . acetaminophen (TYLENOL) 325 MG tablet Take 1-2 tablets  (325-650 mg total) by mouth every 4 (four) hours as needed for mild pain.    Marland Kitchen albuterol (PROVENTIL HFA;VENTOLIN HFA) 108 (90 Base) MCG/ACT inhaler Inhale 1 puff into the lungs every 4 (four) hours as needed for wheezing or shortness of breath.     Marland Kitchen amLODipine (NORVASC) 10 MG tablet Take 1 tablet (10 mg total) by mouth daily. 30 tablet 1  . brimonidine (ALPHAGAN) 0.2 % ophthalmic solution Place 1 drop into both eyes 2 (two) times daily.    Marland Kitchen docusate sodium (COLACE) 100 MG capsule Take 1 capsule (100 mg total) by mouth 2 (two) times daily. 60 capsule 1  . Etelcalcetide HCl (PARSABIV IV) Etelcalcetide (Parsabiv)    . ferric citrate (AURYXIA) 1 GM 210 MG(Fe) tablet Take 1 tablet (210 mg total) by mouth 3 (three) times daily with meals. 90 tablet 1  . fluticasone (FLONASE) 50 MCG/ACT nasal spray Place 1 spray into both nostrils daily.    Marland Kitchen glucose blood test strip 1 each by Other route as needed for other. Use as instructed 100 each 11  . insulin lispro (ADMELOG SOLOSTAR) 100 UNIT/ML KwikPen 3 times a day (just before each meal), 6-6-4 units, and pen needles 3/day 15 mL 11  . Lancets (ACCU-CHEK SOFT TOUCH) lancets Use as instructed 100 each 12  . latanoprost (XALATAN) 0.005 % ophthalmic solution Place 1 drop into both eyes at bedtime.    . methocarbamol (ROBAXIN) 500 MG tablet Take by mouth.    . metoCLOPramide (REGLAN) 5 MG tablet Take 1 tablet (5 mg total) by mouth 3 (three) times daily before meals. 90 tablet 0  . metoprolol succinate (TOPROL-XL) 50 MG 24 hr tablet Take 1 tablet (50 mg total) by mouth at bedtime. Take with a meal. 90 tablet 3  . multivitamin (RENA-VIT) TABS tablet Take 1 tablet by mouth at bedtime. 30 tablet 1  . omeprazole (PRILOSEC) 20 MG capsule Take 1 capsule (20 mg total) by mouth at bedtime. 90 capsule 3  . timolol (BETIMOL) 0.5 % ophthalmic solution Place 1 drop into both eyes 2 (two) times daily. 10 mL 1   No current facility-administered medications on file prior to  visit.    Allergies  Allergen Reactions  . Gabapentin Other (See Comments)    Altered mental status    Family History  Problem Relation Age of Onset  . Diabetes Mother   . Hypertension Father     BP 122/76 (BP Location: Right Arm, Patient Position: Sitting, Cuff Size: Large)   Pulse (!) 105   Wt 202 lb (91.6 kg) Comment: verbalized that it was obtained @ dialysis  SpO2 (!) 87% Comment: uses O2 2L at home but did not bring  BMI 27.40 kg/m    Review of Systems  He denies hypoglycemia.      Objective:   Physical Exam VITAL SIGNS:  See vs page GENERAL: no distress.  Does not have 02 on today.  Ext: bilat AKA's.    Lab Results  Component Value Date   HGBA1C 7.1 (A) 04/17/2019   Lab Results  Component Value Date   CREATININE 9.88 (H) 09/29/2018   BUN 63 (H) 09/29/2018   NA 132 (L) 09/29/2018   K 4.2 09/29/2018   CL 94 (L) 09/29/2018   CO2 25 09/29/2018       Assessment & Plan:  Type 1 DM: uncertain glycemic control. Renal failure: Fructosamine is a more reliable indicator of glycemic control.   Patient Instructions  A different type of diabetes blood test is requested for you today.  We'll let you know about the results.  If it is high, we'll add to each insulin shot. Please come back for a follow-up appointment in 3 months. check your blood sugar twice a day.  vary the time of day when you check, between before the 3 meals, and at bedtime.  also check if you have symptoms of your blood sugar being too high or too low.  please keep a record of the readings and bring it to your next appointment here (or you can bring the meter itself).  You can write it on any.   piece of paper.  please call us sooner if your blood sugar goes below 70, or if you have a lot of readings over 200.

## 2019-04-17 NOTE — Patient Instructions (Addendum)
A different type of diabetes blood test is requested for you today.  We'll let you know about the results.  If it is high, we'll add to each insulin shot. Please come back for a follow-up appointment in 3 months. check your blood sugar twice a day.  vary the time of day when you check, between before the 3 meals, and at bedtime.  also check if you have symptoms of your blood sugar being too high or too low.  please keep a record of the readings and bring it to your next appointment here (or you can bring the meter itself).  You can write it on any.   piece of paper.  please call us sooner if your blood sugar goes below 70, or if you have a lot of readings over 200.

## 2019-04-22 ENCOUNTER — Other Ambulatory Visit: Payer: Self-pay | Admitting: Endocrinology

## 2019-04-22 LAB — FRUCTOSAMINE: Fructosamine: 455 umol/L — ABNORMAL HIGH (ref 205–285)

## 2019-04-22 MED ORDER — INSULIN LISPRO (1 UNIT DIAL) 100 UNIT/ML (KWIKPEN): PEN_INJECTOR | SUBCUTANEOUS | 11 refills | Status: DC

## 2019-04-23 ENCOUNTER — Telehealth: Payer: Self-pay

## 2019-04-23 NOTE — Telephone Encounter (Signed)
Pt returned call. Informed about lab results and new orders. Verbalized acceptance and understanding. 

## 2019-04-23 NOTE — Telephone Encounter (Signed)
Lab results reviewed by Dr. Ellison. Called pt to inform about lab results as well as new orders. LVM requesting returned call. 

## 2019-04-23 NOTE — Telephone Encounter (Signed)
-----   Message from Renato Shin, MD sent at 04/22/2019  4:24 PM EST ----- please contact patient: This is like an A1c of 11.5%.  please increase insulin to 3 times a day (just before each meal), 6-6-8 units.  I'll see you next time.

## 2019-05-01 ENCOUNTER — Ambulatory Visit: Payer: Self-pay | Admitting: Emergency Medicine

## 2019-05-03 ENCOUNTER — Ambulatory Visit: Payer: Self-pay | Admitting: Emergency Medicine

## 2019-05-08 ENCOUNTER — Other Ambulatory Visit: Payer: Self-pay

## 2019-05-08 ENCOUNTER — Ambulatory Visit: Payer: 59

## 2019-05-08 DIAGNOSIS — G4733 Obstructive sleep apnea (adult) (pediatric): Secondary | ICD-10-CM | POA: Diagnosis not present

## 2019-05-11 DIAGNOSIS — G4733 Obstructive sleep apnea (adult) (pediatric): Secondary | ICD-10-CM | POA: Diagnosis not present

## 2019-05-15 ENCOUNTER — Telehealth: Payer: Self-pay | Admitting: Pulmonary Disease

## 2019-05-15 DIAGNOSIS — J9612 Chronic respiratory failure with hypercapnia: Secondary | ICD-10-CM

## 2019-05-15 DIAGNOSIS — G4733 Obstructive sleep apnea (adult) (pediatric): Secondary | ICD-10-CM

## 2019-05-15 NOTE — Telephone Encounter (Signed)
HST performed 05/08/19  Dr. Ander Slade has reviewed the home sleep test this showed severe obstructive sleep apnea with severe oxygen desaturations.   Recommendations   Recommend in-lab titration study for severe sleep apnea with severe oxygen desaturations      Attempted to call pt but unable to reach. Left message for pt to return call.

## 2019-05-17 ENCOUNTER — Other Ambulatory Visit: Payer: Self-pay

## 2019-05-17 ENCOUNTER — Encounter: Payer: Self-pay | Admitting: Emergency Medicine

## 2019-05-17 ENCOUNTER — Ambulatory Visit (INDEPENDENT_AMBULATORY_CARE_PROVIDER_SITE_OTHER): Payer: Medicare Other | Admitting: Emergency Medicine

## 2019-05-17 VITALS — BP 151/88 | HR 88 | Temp 98.3°F | Resp 15

## 2019-05-17 DIAGNOSIS — I132 Hypertensive heart and chronic kidney disease with heart failure and with stage 5 chronic kidney disease, or end stage renal disease: Secondary | ICD-10-CM

## 2019-05-17 DIAGNOSIS — N185 Chronic kidney disease, stage 5: Secondary | ICD-10-CM

## 2019-05-17 DIAGNOSIS — Z89611 Acquired absence of right leg above knee: Secondary | ICD-10-CM | POA: Diagnosis not present

## 2019-05-17 DIAGNOSIS — J9612 Chronic respiratory failure with hypercapnia: Secondary | ICD-10-CM | POA: Diagnosis not present

## 2019-05-17 DIAGNOSIS — E118 Type 2 diabetes mellitus with unspecified complications: Secondary | ICD-10-CM

## 2019-05-17 DIAGNOSIS — G4733 Obstructive sleep apnea (adult) (pediatric): Secondary | ICD-10-CM | POA: Diagnosis not present

## 2019-05-17 DIAGNOSIS — I110 Hypertensive heart disease with heart failure: Secondary | ICD-10-CM

## 2019-05-17 DIAGNOSIS — Z992 Dependence on renal dialysis: Secondary | ICD-10-CM

## 2019-05-17 DIAGNOSIS — N186 End stage renal disease: Secondary | ICD-10-CM

## 2019-05-17 DIAGNOSIS — I5023 Acute on chronic systolic (congestive) heart failure: Secondary | ICD-10-CM

## 2019-05-17 DIAGNOSIS — E1142 Type 2 diabetes mellitus with diabetic polyneuropathy: Secondary | ICD-10-CM

## 2019-05-17 DIAGNOSIS — Z89612 Acquired absence of left leg above knee: Secondary | ICD-10-CM

## 2019-05-17 DIAGNOSIS — I739 Peripheral vascular disease, unspecified: Secondary | ICD-10-CM

## 2019-05-17 LAB — POCT GLYCOSYLATED HEMOGLOBIN (HGB A1C): Hemoglobin A1C: 7.3 % — AB (ref 4.0–5.6)

## 2019-05-17 LAB — GLUCOSE, POCT (MANUAL RESULT ENTRY): POC Glucose: 179 mg/dl — AB (ref 70–99)

## 2019-05-17 MED ORDER — AMLODIPINE BESYLATE 10 MG PO TABS: 10.0000 mg | ORAL_TABLET | Freq: Every day | ORAL | 1 refills | Status: DC

## 2019-05-17 NOTE — Progress Notes (Signed)
102

## 2019-05-17 NOTE — Patient Instructions (Addendum)
   If you have lab work done today you will be contacted with your lab results within the next 2 weeks.  If you have not heard from us then please contact us. The fastest way to get your results is to register for My Chart.   IF you received an x-ray today, you will receive an invoice from [facility]. Please contact  Radiology at [phone] with questions or concerns regarding your invoice.   IF you received labwork today, you will receive an invoice from [facility]. Please contact [facility] at [phone] with questions or concerns regarding your invoice.   Our billing staff will not be able to assist you with questions regarding bills from these companies.  You will be contacted with the lab results as soon as they are available. The fastest way to get your results is to activate your My Chart account. Instructions are located on the last page of this paperwork. If you have not heard from us regarding the results in 2 weeks, please contact this office.     Diabetes Mellitus and Nutrition, Adult When you have diabetes (diabetes mellitus), it is very important to have healthy eating habits because your blood sugar (glucose) levels are greatly affected by what you eat and drink. Eating healthy foods in the appropriate amounts, at about the same times every day, can help you:  Control your blood glucose.  Lower your risk of heart disease.  Improve your blood pressure.  Reach or maintain a healthy weight. Every person with diabetes is different, and each person has different needs for a meal plan. Your health care provider may recommend that you work with a diet and nutrition specialist (dietitian) to make a meal plan that is best for you. Your meal plan may vary depending on factors such as:  The calories you need.  The medicines you take.  Your weight.  Your blood glucose, blood pressure, and cholesterol levels.  Your activity level.  Other health conditions  you have, such as heart or kidney disease. How do carbohydrates affect me? Carbohydrates, also called carbs, affect your blood glucose level more than any other type of food. Eating carbs naturally raises the amount of glucose in your blood. Carb counting is a method for keeping track of how many carbs you eat. Counting carbs is important to keep your blood glucose at a healthy level, especially if you use insulin or take certain oral diabetes medicines. It is important to know how many carbs you can safely have in each meal. This is different for every person. Your dietitian can help you calculate how many carbs you should have at each meal and for each snack. Foods that contain carbs include:  Bread, cereal, rice, pasta, and crackers.  Potatoes and corn.  Peas, beans, and lentils.  Milk and yogurt.  Fruit and juice.  Desserts, such as cakes, cookies, ice cream, and candy. How does alcohol affect me? Alcohol can cause a sudden decrease in blood glucose (hypoglycemia), especially if you use insulin or take certain oral diabetes medicines. Hypoglycemia can be a life-threatening condition. Symptoms of hypoglycemia (sleepiness, dizziness, and confusion) are similar to symptoms of having too much alcohol. If your health care provider says that alcohol is safe for you, follow these guidelines:  Limit alcohol intake to no more than 1 drink per day for nonpregnant women and 2 drinks per day for men. One drink equals 12 oz of beer, 5 oz of wine, or 1 oz of hard liquor.    Do not drink on an empty stomach.  Keep yourself hydrated with water, diet soda, or unsweetened iced tea.  Keep in mind that regular soda, juice, and other mixers may contain a lot of sugar and must be counted as carbs. What are tips for following this plan?  Reading food labels  Start by checking the serving size on the "Nutrition Facts" label of packaged foods and drinks. The amount of calories, carbs, fats, and other  nutrients listed on the label is based on one serving of the item. Many items contain more than one serving per package.  Check the total grams (g) of carbs in one serving. You can calculate the number of servings of carbs in one serving by dividing the total carbs by 15. For example, if a food has 30 g of total carbs, it would be equal to 2 servings of carbs.  Check the number of grams (g) of saturated and trans fats in one serving. Choose foods that have low or no amount of these fats.  Check the number of milligrams (mg) of salt (sodium) in one serving. Most people should limit total sodium intake to less than 2,300 mg per day.  Always check the nutrition information of foods labeled as "low-fat" or "nonfat". These foods may be higher in added sugar or refined carbs and should be avoided.  Talk to your dietitian to identify your daily goals for nutrients listed on the label. Shopping  Avoid buying canned, premade, or processed foods. These foods tend to be high in fat, sodium, and added sugar.  Shop around the outside edge of the grocery store. This includes fresh fruits and vegetables, bulk grains, fresh meats, and fresh dairy. Cooking  Use low-heat cooking methods, such as baking, instead of high-heat cooking methods like deep frying.  Cook using healthy oils, such as olive, canola, or sunflower oil.  Avoid cooking with butter, cream, or high-fat meats. Meal planning  Eat meals and snacks regularly, preferably at the same times every day. Avoid going long periods of time without eating.  Eat foods high in fiber, such as fresh fruits, vegetables, beans, and whole grains. Talk to your dietitian about how many servings of carbs you can eat at each meal.  Eat 4-6 ounces (oz) of lean protein each day, such as lean meat, chicken, fish, eggs, or tofu. One oz of lean protein is equal to: ? 1 oz of meat, chicken, or fish. ? 1 egg. ?  cup of tofu.  Eat some foods each day that contain  healthy fats, such as avocado, nuts, seeds, and fish. Lifestyle  Check your blood glucose regularly.  Exercise regularly as told by your health care provider. This may include: ? 150 minutes of moderate-intensity or vigorous-intensity exercise each week. This could be brisk walking, biking, or water aerobics. ? Stretching and doing strength exercises, such as yoga or weightlifting, at least 2 times a week.  Take medicines as told by your health care provider.  Do not use any products that contain nicotine or tobacco, such as cigarettes and e-cigarettes. If you need help quitting, ask your health care provider.  Work with a counselor or diabetes educator to identify strategies to manage stress and any emotional and social challenges. Questions to ask a health care provider  Do I need to meet with a diabetes educator?  Do I need to meet with a dietitian?  What number can I call if I have questions?  When are the best times to   times to check my blood glucose? Where to find more information:  American Diabetes Association: diabetes.org  Academy of Nutrition and Dietetics: www.eatright.org  National Institute of Diabetes and Digestive and Kidney Diseases (NIH): www.niddk.nih.gov Summary  A healthy meal plan will help you control your blood glucose and maintain a healthy lifestyle.  Working with a diet and nutrition specialist (dietitian) can help you make a meal plan that is best for you.  Keep in mind that carbohydrates (carbs) and alcohol have immediate effects on your blood glucose levels. It is important to count carbs and to use alcohol carefully. This information is not intended to replace advice given to you by your health care provider. Make sure you discuss any questions you have with your health care provider. Document Revised: 02/18/2017 Document Reviewed: 04/12/2016 Elsevier Patient Education  2020 [employer]  

## 2019-05-17 NOTE — Progress Notes (Signed)
Lab Results  Component Value Date   HGBA1C 7.1 (A) 04/17/2019   BP Readings from Last 3 Encounters:  05/17/19 (!) 151/88  04/17/19 122/76  04/10/19 (!) 157/91   Howard Bell 54 y.o.   Chief Complaint  Patient presents with  . Diabetes    pt BS has been good 120 not typically over 160 pt concerned with the way that the endo wrote his insulin for before bed time   . Congestive Heart Failure    pt would like to talk about the COVID-19 vaccine, pt requesting breathing treatment so that the pt may get a prostesis     HISTORY OF PRESENT ILLNESS: This is a 54 y.o. male with multiple chronic medical problems including end-stage renal disease, hypertensive heart disease with congestive heart failure, diabetes with severe peripheral vascular disease status post bilateral above-the-knee amputations, sleep apnea and COPD. Recently saw endocrinologist. Diabetes doing well.  Using premeal low doses of insulin.  Has questions about using insulin when not having a meal. Also recently saw pulmonary doctor.  There were some concerns about  desaturation on exertion. No other complaints or medical concerns today.  HPI   Prior to Admission medications   Medication Sig Start Date End Date Taking? Authorizing Provider  acetaminophen (TYLENOL) 325 MG tablet Take 1-2 tablets (325-650 mg total) by mouth every 4 (four) hours as needed for mild pain. 09/26/18  Yes Love, Ivan Anchors, PA-C  albuterol (PROVENTIL HFA;VENTOLIN HFA) 108 (90 Base) MCG/ACT inhaler Inhale 1 puff into the lungs every 4 (four) hours as needed for wheezing or shortness of breath.    Yes [provider]  amLODipine (NORVASC) 10 MG tablet Take 1 tablet (10 mg total) by mouth daily. 05/17/19 08/15/19 Yes Modell Fendrick, Ines Bloomer, MD  brimonidine (ALPHAGAN) 0.2 % ophthalmic solution Place 1 drop into both eyes 2 (two) times daily.   Yes [provider]  docusate sodium (COLACE) 100 MG capsule Take 1 capsule (100 mg total) by  mouth 2 (two) times daily. 01/22/19  Yes Tasha Diaz, Ines Bloomer, MD  Etelcalcetide HCl (PARSABIV IV) Etelcalcetide Hermina Staggers) 04/02/19 03/31/20 Yes [provider]  ferric citrate (AURYXIA) 1 GM 210 MG(Fe) tablet Take 1 tablet (210 mg total) by mouth 3 (three) times daily with meals. 09/29/18  Yes Love, Ivan Anchors, PA-C  fluticasone (FLONASE) 50 MCG/ACT nasal spray Place 1 spray into both nostrils daily.   Yes [provider]  glucose blood test strip 1 each by Other route as needed for other. Use as instructed 10/31/18  Yes Rasheen Schewe, Ines Bloomer, MD  insulin lispro (ADMELOG SOLOSTAR) 100 UNIT/ML KwikPen 3 times a day (just before each meal), 6-6-8 units, and pen needles 3/day 04/22/19  Yes Renato Shin, MD  Lancets (ACCU-CHEK SOFT [facility]) lancets Use as instructed 10/31/18  Yes Reeta Kuk, Ines Bloomer, MD  latanoprost (XALATAN) 0.005 % ophthalmic solution Place 1 drop into both eyes at bedtime.   Yes [provider]  methocarbamol (ROBAXIN) 500 MG tablet Take by mouth. 09/29/18  Yes [provider]  metoCLOPramide (REGLAN) 5 MG tablet Take 1 tablet (5 mg total) by mouth 3 (three) times daily before meals. 08/30/18  Yes Love, Ivan Anchors, PA-C  multivitamin (RENA-VIT) TABS tablet Take 1 tablet by mouth at bedtime. 01/22/19  Yes Tysin Salada, Ines Bloomer, MD  omeprazole (PRILOSEC) 20 MG capsule Take 1 capsule (20 mg total) by mouth at bedtime. 01/22/19  Yes Raheel Kunkle, Ines Bloomer, MD  timolol (BETIMOL) 0.5 % ophthalmic solution Place 1 drop into  both eyes 2 (two) times daily. 05/07/17  Yes Ivar Drape D, PA  metoprolol succinate (TOPROL-XL) 50 MG 24 hr tablet Take 1 tablet (50 mg total) by mouth at bedtime. Take with a meal. 01/22/19 04/22/19  Horald Pollen, MD    Allergies  Allergen Reactions  . Gabapentin Other (See Comments)    Altered mental status    Patient Active Problem List   Diagnosis Date Noted  . Abnormality of gait 01/30/2019  . S/P AKA (above knee  amputation) bilateral ([place]) 11/07/2018  . Chronic hypercapnic respiratory failure ([place]) 10/02/2018  . OSA (obstructive sleep apnea)   . S/P AKA (above knee amputation) unilateral, right ([place])   . Diabetic peripheral neuropathy ([place])   . Status post bilateral above knee amputation ([place]) 09/08/2018  . Atherosclerosis of native arteries of extremities with gangrene, left leg ([place]) 09/05/2018  . Peripheral arterial disease ([place])   . Unilateral AKA, right ([place]) 08/17/2018  . Drug induced constipation   . Uncontrolled diabetes mellitus type 2 with peripheral artery disease ([place])   . Anemia of chronic disease   . Diabetes mellitus type 2 in nonobese (HCC)   . Supplemental oxygen dependent   . Unilateral AKA, left ([place]) 08/07/2018  . S/P BKA (below knee amputation), right ([place]) 08/04/2018  . Critical limb ischemia with history of revascularization of same extremity 05/09/2018  . 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 ([place]) 11/16/2017  . 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 ([place]) 11/16/2017  . CKD stage 5 due to type 2 diabetes mellitus ([place]) 11/16/2017  . Glaucoma due to type 2 diabetes mellitus ([place]) 11/16/2017  . Chronic generalized pain 11/16/2017  . Recurrent pneumonia 11/06/2017  . Diabetic gastroparesis ([place]) 11/06/2017  . Generalized abdominal pain 09/13/2017  . Pain in right shoulder 07/26/2017  . ESRD on dialysis ([place]) 03/28/2017  . Anemia due to end stage renal disease ([place]) 08/04/2016    Past Medical History:  Diagnosis Date  . Arthritis   . Congestive heart failure (CHF) ([place])   . Depression   . 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 Diabetic gastroparesis ([place]) 11/06/2017  . ESRD (end stage renal disease) on dialysis [facility])    M,W.F dialysis  . H/O seasonal allergies   . Hypertension   . Pneumonia   . Sleep apnea    not using it now, on continuous O2 2 L during the  day, 3 L during the night    Past Surgical History:  Procedure Laterality Date  . AMPUTATION Right 05/14/2018   Procedure: AMPUTATION BELOW KNEE;  Surgeon: Newt Minion, MD;  Location: [place];  Service: Orthopedics;  Laterality: Right;  . AMPUTATION Right 08/04/2018   Procedure: RIGHT ABOVE KNEE AMPUTATION;  Surgeon: Newt Minion, MD;  Location: [place];  Service: Orthopedics;  Laterality: Right;  . AMPUTATION Left 09/08/2018   Procedure: LEFT ABOVE KNEE AMPUTATION;  Surgeon: Newt Minion, MD;  Location: [place];  Service: Orthopedics;  Laterality: Left;  . APPLICATION OF WOUND VAC Left 09/08/2018   Procedure: Application Of Wound Vac;  Surgeon: Newt Minion, MD;  Location: [place];  Service: Orthopedics;  Laterality: Left;  . ESOPHAGOGASTRODUODENOSCOPY (EGD) WITH PROPOFOL N/A 12/05/2017   Procedure: ESOPHAGOGASTRODUODENOSCOPY (EGD) WITH PROPOFOL;  Surgeon: Doran Stabler, MD;  Location: [facility];  Service: Gastroenterology;  Laterality: N/A;  . HERNIA REPAIR    . IR AV  DIALY SHUNT INTRO NEEDLE/INTRACATH INITIAL W/PTA/IMG LEFT  03/30/2017    Social History   Socioeconomic History  . Marital status: Married    Spouse name: Not on file  . Number of children: Not on file  . Years of education: Not on file  . Highest education level: Not on file  Occupational History  . Not on file  Tobacco Use  . Smoking status: Never Smoker  . Smokeless tobacco: Never Used  Substance and Sexual Activity  . Alcohol use: No  . Drug use: No  . Sexual activity: Not on file  Other Topics Concern  . Not on file  Social History Narrative  . Not on file   Social Determinants of Health   Financial Resource Strain:   . Difficulty of Paying Living Expenses: Not on file  Food Insecurity:   . Worried About Charity fundraiser in the Last Year: Not on file  . Ran Out of Food in the Last Year: Not on file  Transportation Needs:   . Lack of Transportation (Medical): Not on file  . Lack of  Transportation (Non-Medical): Not on file  Physical Activity:   . Days of Exercise per Week: Not on file  . Minutes of Exercise per Session: Not on file  Stress:   . Feeling of Stress : Not on file  Social Connections:   . Frequency of Communication with Friends and Family: Not on file  . Frequency of Social Gatherings with Friends and Family: Not on file  . Attends Religious Services: Not on file  . Active Member of Clubs or Organizations: Not on file  . Attends Archivist Meetings: Not on file  . Marital Status: Not on file  Intimate Partner Violence:   . Fear of Current or Ex-Partner: Not on file  . Emotionally Abused: Not on file  . Physically Abused: Not on file  . Sexually Abused: Not on file    Family History  Problem Relation Age of Onset  . Diabetes Mother   . Hypertension Father      Review of Systems  Constitutional: Negative.  Negative for chills and fever.  HENT: Negative.  Negative for congestion and sore throat.   Respiratory: Positive for cough (Occasional). Negative for hemoptysis, sputum production and shortness of breath.   Cardiovascular: Negative for chest pain and palpitations.  Gastrointestinal: Negative for abdominal pain, nausea and vomiting.  Skin: Negative.  Negative for rash.  All other systems reviewed and are negative.  Today's Vitals   05/17/19 1612  BP: (!) 151/88  Pulse: 88  Resp: 15  Temp: 98.3 F (36.8 C)  TempSrc: Temporal  SpO2: 92%   There is no height or weight on file to calculate BMI.   Physical Exam Vitals reviewed.  Constitutional:      Appearance: Normal appearance.     Comments: Wheelchair-bound  HENT:     Head: Normocephalic.  Eyes:     Extraocular Movements: Extraocular movements intact.  Cardiovascular:     Rate and Rhythm: Normal rate and regular rhythm.  Pulmonary:     Effort: Pulmonary effort is normal.     Breath sounds: Normal breath sounds.  Musculoskeletal:     Comments: Bilateral AKA    Skin:    General: Skin is warm and dry.  Neurological:     General: No focal deficit present.     Mental Status: He is alert and oriented to person, place, and time.  Psychiatric:  Mood and Affect: Mood normal.        Behavior: Behavior normal.    A total of 30 minutes was spent with the patient, greater than 50% of which was in counseling/coordination of care regarding chronic medical problems, review of most recent office visit notes, review of most recent blood work, review of all medications, prognosis, and need for follow-up.   ASSESSMENT & PLAN: Clinically stable.  No medical concerns identified during this visit.  Continue present medications.  No changes. Follow-up in 6 months.  Howard Bell was seen today for diabetes and congestive heart failure.  Diagnoses and all orders for this visit:  Diabetes mellitus with complication ([place]) -     POCT glucose (manual entry) -     POCT glycosylated hemoglobin (Hb A1C)  Chronic hypercapnic respiratory failure (HCC)  S/P AKA (above knee amputation) bilateral (HCC)  OSA (obstructive sleep apnea)  Diabetic peripheral neuropathy (HCC)  Peripheral arterial disease (HCC)  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 (HCC)  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 ([place])  ESRD on dialysis ([place])  Other orders -     amLODipine (NORVASC) 10 MG tablet; Take 1 tablet (10 mg total) by mouth daily.    Patient Instructions       If you have lab work done today you will be contacted with your lab results within the next 2 weeks.  If you have not heard from [place] then please contact us. The fastest way to get your results is to register for My Chart.   IF you received an x-ray today, you will receive an invoice from [facility]. Please contact [facility] at [phone] with questions or concerns regarding your invoice.   IF you received labwork  today, you will receive an invoice from [facility]. Please contact [facility] at [phone] with questions or concerns regarding your invoice.   Our billing staff will not be able to assist you with questions regarding bills from these companies.  You will be contacted with the lab results as soon as they are available. The fastest way to get your results is to activate your My Chart account. Instructions are located on the last page of this paperwork. If you have not heard from [place] regarding the results in 2 weeks, please contact this office.     Diabetes Mellitus and Nutrition, Adult When you have diabetes (diabetes mellitus), it is very important to have healthy eating habits because your blood sugar (glucose) levels are greatly affected by what you eat and drink. Eating healthy foods in the appropriate amounts, at about the same times every day, can help you:  Control your blood glucose.  Lower your risk of heart disease.  Improve your blood pressure.  Reach or maintain a healthy weight. Every person with diabetes is different, and each person has different needs for a meal plan. Your health care provider may recommend that you work with a diet and nutrition specialist (dietitian) to make a meal plan that is best for you. Your meal plan may vary depending on factors such as:  The calories you need.  The medicines you take.  Your weight.  Your blood glucose, blood pressure, and cholesterol levels.  Your activity level.  Other health conditions you have, such as heart or kidney disease. How do carbohydrates affect me? Carbohydrates, also called carbs, affect your blood glucose level more than any other type of food. Eating carbs naturally raises the  amount of glucose in your blood. Carb counting is a method for keeping track of how many carbs you eat. Counting carbs is important to keep your blood glucose at a healthy level, especially if you use insulin or take certain oral diabetes  medicines. It is important to know how many carbs you can safely have in each meal. This is different for every person. Your dietitian can help you calculate how many carbs you should have at each meal and for each snack. Foods that contain carbs include:  Bread, cereal, rice, pasta, and crackers.  Potatoes and corn.  Peas, beans, and lentils.  Milk and yogurt.  Fruit and juice.  Desserts, such as cakes, cookies, ice cream, and candy. How does alcohol affect me? Alcohol can cause a sudden decrease in blood glucose (hypoglycemia), especially if you use insulin or take certain oral diabetes medicines. Hypoglycemia can be a life-threatening condition. Symptoms of hypoglycemia (sleepiness, dizziness, and confusion) are similar to symptoms of having too much alcohol. If your health care provider says that alcohol is safe for you, follow these guidelines:  Limit alcohol intake to no more than 1 drink per day for nonpregnant women and 2 drinks per day for men. One drink equals 12 oz of beer, 5 oz of wine, or 1 oz of hard liquor.  Do not drink on an empty stomach.  Keep yourself hydrated with water, diet soda, or unsweetened iced tea.  Keep in mind that regular soda, juice, and other mixers may contain a lot of sugar and must be counted as carbs. What are tips for following this plan?  Reading food labels  Start by checking the serving size on the "Nutrition Facts" label of packaged foods and drinks. The amount of calories, carbs, fats, and other nutrients listed on the label is based on one serving of the item. Many items contain more than one serving per package.  Check the total grams (g) of carbs in one serving. You can calculate the number of servings of carbs in one serving by dividing the total carbs by 15. For example, if a food has 30 g of total carbs, it would be equal to 2 servings of carbs.  Check the number of grams (g) of saturated and trans fats in one serving. Choose foods  that have low or no amount of these fats.  Check the number of milligrams (mg) of salt (sodium) in one serving. Most people should limit total sodium intake to less than 2,300 mg per day.  Always check the nutrition information of foods labeled as "low-fat" or "nonfat". These foods may be higher in added sugar or refined carbs and should be avoided.  Talk to your dietitian to identify your daily goals for nutrients listed on the label. Shopping  Avoid buying canned, premade, or processed foods. These foods tend to be high in fat, sodium, and added sugar.  Shop around the outside edge of the grocery store. This includes fresh fruits and vegetables, bulk grains, fresh meats, and fresh dairy. Cooking  Use low-heat cooking methods, such as baking, instead of high-heat cooking methods like deep frying.  Cook using healthy oils, such as olive, canola, or sunflower oil.  Avoid cooking with butter, cream, or high-fat meats. Meal planning  Eat meals and snacks regularly, preferably at the same times every day. Avoid going long periods of time without eating.  Eat foods high in fiber, such as fresh fruits, vegetables, beans, and whole grains. Talk to your dietitian about  how many servings of carbs you can eat at each meal.  Eat 4-6 ounces (oz) of lean protein each day, such as lean meat, chicken, fish, eggs, or tofu. One oz of lean protein is equal to: ? 1 oz of meat, chicken, or fish. ? 1 egg. ?  cup of tofu.  Eat some foods each day that contain healthy fats, such as avocado, nuts, seeds, and fish. Lifestyle  Check your blood glucose regularly.  Exercise regularly as told by your health care provider. This may include: ? 150 minutes of moderate-intensity or vigorous-intensity exercise each week. This could be brisk walking, biking, or water aerobics. ? Stretching and doing strength exercises, such as yoga or weightlifting, at least 2 times a week.  Take medicines as told by your  health care provider.  Do not use any products that contain nicotine or tobacco, such as cigarettes and e-cigarettes. If you need help quitting, ask your health care provider.  Work with a Social worker or diabetes educator to identify strategies to manage stress and any emotional and social challenges. Questions to ask a health care provider  Do I need to meet with a diabetes educator?  Do I need to meet with a dietitian?  What number can I call if I have questions?  When are the best times to check my blood glucose? Where to find more information:  American Diabetes Association: diabetes.org  Academy of Nutrition and Dietetics: www.eatright.[employer] of Diabetes and Digestive and Kidney Diseases (NIH): [employer] Summary  A healthy meal plan will help you control your blood glucose and maintain a healthy lifestyle.  Working with a diet and nutrition specialist (dietitian) can help you make a meal plan that is best for you.  Keep in mind that carbohydrates (carbs) and alcohol have immediate effects on your blood glucose levels. It is important to count carbs and to use alcohol carefully. This information is not intended to replace advice given to you by your health care provider. Make sure you discuss any questions you have with your health care provider. Document Revised: 02/18/2017 Document Reviewed: 04/12/2016 Elsevier Patient Education  2020 Elsevier Inc.      Agustina Caroli, MD [facility]

## 2019-05-22 ENCOUNTER — Other Ambulatory Visit: Payer: Self-pay | Admitting: *Deleted

## 2019-05-22 DIAGNOSIS — J9612 Chronic respiratory failure with hypercapnia: Secondary | ICD-10-CM

## 2019-05-22 DIAGNOSIS — G4733 Obstructive sleep apnea (adult) (pediatric): Secondary | ICD-10-CM

## 2019-05-22 NOTE — Telephone Encounter (Signed)
Attempted to call pt but unable to reach him.left message for pt to return call. Called other number posted for pt and spoke with his wife Howard Bell providing her with the results of the sleep study and that recommendations were for him to have an in-lab study and she verbalized understanding.  Order has been placed for the split night study. Nothing further needed.

## 2019-06-08 ENCOUNTER — Other Ambulatory Visit (HOSPITAL_COMMUNITY): Payer: 59

## 2019-06-09 ENCOUNTER — Other Ambulatory Visit (HOSPITAL_COMMUNITY)
Admission: RE | Admit: 2019-06-09 | Discharge: 2019-06-09 | Disposition: A | Discharge status: Home / Self Care | Payer: Medicare (Managed Care) | Source: Ambulatory Visit | Attending: Pulmonary Disease | Admitting: Pulmonary Disease

## 2019-06-09 DIAGNOSIS — Z01812 Encounter for preprocedural laboratory examination: Secondary | ICD-10-CM | POA: Insufficient documentation

## 2019-06-09 DIAGNOSIS — Z20822 Contact with and (suspected) exposure to covid-19: Secondary | ICD-10-CM | POA: Diagnosis not present

## 2019-06-09 LAB — SARS CORONAVIRUS 2 (TAT 6-24 HRS): SARS Coronavirus 2: NEGATIVE

## 2019-06-11 ENCOUNTER — Encounter (HOSPITAL_BASED_OUTPATIENT_CLINIC_OR_DEPARTMENT_OTHER): Payer: 59 | Admitting: Pulmonary Disease

## 2019-06-12 ENCOUNTER — Ambulatory Visit (HOSPITAL_BASED_OUTPATIENT_CLINIC_OR_DEPARTMENT_OTHER): Payer: Medicare (Managed Care) | Attending: Pulmonary Disease | Admitting: Pulmonary Disease

## 2019-06-12 ENCOUNTER — Telehealth: Payer: Self-pay | Admitting: Adult Health

## 2019-06-12 VITALS — Ht 72.0 in | Wt 220.0 lb

## 2019-06-12 DIAGNOSIS — E119 Type 2 diabetes mellitus without complications: Secondary | ICD-10-CM | POA: Insufficient documentation

## 2019-06-12 DIAGNOSIS — G4733 Obstructive sleep apnea (adult) (pediatric): Secondary | ICD-10-CM | POA: Diagnosis not present

## 2019-06-12 DIAGNOSIS — J9612 Chronic respiratory failure with hypercapnia: Secondary | ICD-10-CM | POA: Insufficient documentation

## 2019-06-12 DIAGNOSIS — I509 Heart failure, unspecified: Secondary | ICD-10-CM | POA: Insufficient documentation

## 2019-06-12 NOTE — Telephone Encounter (Signed)
Patient called with questions regarding sleep study scheduled for June 12, 2019.  Patient was giving sleep center phone number and address.  According to appointment register he is scheduled for tonight at 8 PM.

## 2019-06-13 ENCOUNTER — Other Ambulatory Visit (HOSPITAL_BASED_OUTPATIENT_CLINIC_OR_DEPARTMENT_OTHER): Payer: Self-pay

## 2019-06-13 DIAGNOSIS — J9612 Chronic respiratory failure with hypercapnia: Secondary | ICD-10-CM

## 2019-06-13 DIAGNOSIS — G4733 Obstructive sleep apnea (adult) (pediatric): Secondary | ICD-10-CM | POA: Diagnosis not present

## 2019-06-17 ENCOUNTER — Telehealth: Payer: Self-pay | Admitting: Pulmonary Disease

## 2019-06-17 DIAGNOSIS — G4733 Obstructive sleep apnea (adult) (pediatric): Secondary | ICD-10-CM

## 2019-06-17 NOTE — Telephone Encounter (Signed)
Sleep study results  Date of study: 06/12/2019  Impression: Severe obstructive sleep apnea Nocturnal desaturations  Recommendation: - Trial of BiPAP therapy on 26/22 cm H2O with a Medium size Fisher&Paykel Full Face Mask Simplus mask and heated humidification. - Avoid alcohol, sedatives and other CNS depressants that may worsen sleep apnea and disrupt normal sleep architecture.  Follow-up 4-6 weeks following initiation of CPAP therapy.

## 2019-06-17 NOTE — Procedures (Signed)
POLYSOMNOGRAPHY  Last, First: Howard Bell, Howard Bell MRN: [id number] Gender: Male Age (years): 54 Weight (lbs): 220 DOB: 1965/11/05 BMI: 30 Primary Care: No PCP Epworth Score: 20 Referring: Laurin Coder MD Technician: Gwenyth Allegra Interpreting: Laurin Coder MD Study Type: BiPAP Ordered Study Type: CPAP Study date: 06/12/2019 Location: [facility] CLINICAL INFORMATION Howard Bell is a 54 year old Male and was referred to the sleep center for evaluation of G47.33 OSA: Adult and Pediatric (327.23). Indications include Congestive Heart Failure, Diabetes (249.00), OSA.  MEDICATIONS Patient self administered medications include: N/A. Medications administered during study include No sleep medicine administered.  SLEEP STUDY TECHNIQUE The patient underwent an attended overnight polysomnography titration to assess the effects of BIPAP therapy. The following variables were monitored: EEG(C4-A1, C3-A2, O1-A2, O2-A1), EOG, submental and leg EMG, ECG, oxyhemoglobin saturation by pulse oximetry, thoracic and abdominal respiratory effort belts, nasal/oral airflow by pressure sensor, body position sensor and snoring sensor. BIPAP pressure was titrated to eliminate apneas, hypopneas and oxygen desaturation. Hypopneas were scored per AASM definition IB (4% desaturation)  TECHNICIAN COMMENTS Comments added by Technician: None Comments added by Scorer: N/A SLEEP ARCHITECTURE The study was initiated at 10:20:37 PM and terminated at 4:24:31 AM. Total recorded time was 363.9 minutes. EEG confirmed total sleep time was 334 minutes yielding a sleep efficiency of 91.8%%. Sleep onset after lights out was 1.8 minutes with a REM latency of 35.5 minutes. The patient spent 12.9%% of the night in stage N1 sleep, 58.5%% in stage N2 sleep, 0.0%% in stage N3 and 28.6% in REM. The Arousal Index was 10.4/hour. RESPIRATORY PARAMETERS The overall AHI was 45.8 per hour, and the RDI was 45.8 events/hour with a  central apnea index of 18.9per hour. The most appropriate setting of BiPAP was 29/25 cm H2O. At this setting, the sleep efficiency was N/A % and the patient was supine for N/A%. The AHI was N/A events per hour, and the RDI was N/A events/hour (with 18.9 central events) and the arousal index was N/A per hour.The oxygen nadir was 61.0% during sleep.    The cumulative time under 88% oxygen saturation was 5.5 minutes  LEG MOVEMENT DATA The total leg movements were 0 with a resulting leg movement index of 0.0. Associated arousal with leg movement index was 0.0. CARDIAC DATA The underlying cardiac rhythm was most consistent with sinus rhythm. Mean heart rate during sleep was 94.5 bpm. Additional rhythm abnormalities include None.   IMPRESSIONS - Severe Obstructive Sleep apnea(OSA) Optimal pressure attained. - EKG showed no cardiac abnormalities. - Severe Oxygen Desaturation - The patient snored with soft snoring volume. - No significant periodic leg movements(PLMs) during sleep. However, no significant associated arousals.   DIAGNOSIS - Obstructive Sleep Apnea (327.23 [G47.33 ICD-10]) - Nocturnal Hypoxemia (327.26 [G47.36 ICD-10])   RECOMMENDATIONS - Trial of BiPAP therapy on 26/22 cm H2O with a Medium size Fisher&Paykel Full Face Mask Simplus mask and heated humidification.Titrated to 29/25, but, appeared to be well controlled at 26/22 with good REM Sleep. - Avoid alcohol, sedatives and other CNS depressants that may worsen sleep apnea and disrupt normal sleep architecture. - Sleep hygiene should be reviewed to assess factors that may improve sleep quality. - Weight management and regular exercise should be initiated or continued. - Return to Sleep Center for re-evaluation after 4 weeks of therapy  [Electronically signed] 06/17/2019 07:31 AM  Sherrilyn Rist MD NPI: [id number]

## 2019-06-19 ENCOUNTER — Telehealth: Payer: Self-pay | Admitting: Pulmonary Disease

## 2019-06-19 DIAGNOSIS — G4733 Obstructive sleep apnea (adult) (pediatric): Secondary | ICD-10-CM

## 2019-06-19 NOTE — Telephone Encounter (Signed)
Response from [facility] for bipap order:  Howard Bell sent to [name], [name], [name]; [name], the Rx is written for a pressure that is higher than a home BIPAP can do. The max inspiratory pressure is 25 and the Rx is written for 26. Can you please ask Dr. Ander Slade to correct the Rx to a capable pressure?  Thank you.

## 2019-06-19 NOTE — Telephone Encounter (Signed)
Called and spoke with Patient's Wife, Kenney Houseman (DPR). Results and recommendations given.  Understanding stated.  Kenney Houseman stated she would have Patient call with any questions, and to schedule follow up appointment in 4-6 weeks. DME order placed. Nothing further at this time.

## 2019-06-19 NOTE — Telephone Encounter (Signed)
Dr. Jenetta Downer, please advise on this. Thanks!

## 2019-06-21 NOTE — Telephone Encounter (Signed)
We can send in an order for BiPAP 25/20  I will see him in the office 1 month after starting BiPAP with a compliance report

## 2019-06-21 NOTE — Telephone Encounter (Signed)
New DME order placed for bipap 25/20.   Called and spoke with Patient's Wife, Kenney Houseman.  [name] aware of needed appointment 1 month after starting bipap. Nothing further at this time.

## 2019-06-22 ENCOUNTER — Telehealth: Payer: Self-pay | Admitting: Emergency Medicine

## 2019-06-22 NOTE — Telephone Encounter (Signed)
Rec'd call from [facility], [place], [place].  Request was made for COVID test results to be faxed to the [facility].  Pt's. PCP office ([place]) was closed for the day.  Requested to have the [facility] fax a request with their facility letterhead.    Rec'd request from [place] with above request.  Faxed COVID test result from 06/09/19 to the [facility] @ [phone].

## 2019-07-17 ENCOUNTER — Ambulatory Visit: Payer: 59 | Admitting: Endocrinology

## 2019-07-24 ENCOUNTER — Ambulatory Visit: Payer: 59 | Admitting: Endocrinology

## 2019-07-24 DIAGNOSIS — E119 Type 2 diabetes mellitus without complications: Secondary | ICD-10-CM

## 2019-08-16 ENCOUNTER — Other Ambulatory Visit: Payer: Self-pay

## 2019-08-16 ENCOUNTER — Emergency Department (HOSPITAL_COMMUNITY): Payer: Medicare Other

## 2019-08-16 ENCOUNTER — Inpatient Hospital Stay (HOSPITAL_COMMUNITY)
Admission: EM | Admit: 2019-08-16 | Discharge: 2019-08-19 | DRG: 100 | Disposition: A | Discharge status: Home / Self Care | Payer: Medicare Other | Attending: Internal Medicine | Admitting: Internal Medicine

## 2019-08-16 ENCOUNTER — Encounter (HOSPITAL_COMMUNITY): Payer: Self-pay | Admitting: Emergency Medicine

## 2019-08-16 DIAGNOSIS — I10 Essential (primary) hypertension: Secondary | ICD-10-CM

## 2019-08-16 DIAGNOSIS — R443 Hallucinations, unspecified: Secondary | ICD-10-CM | POA: Diagnosis not present

## 2019-08-16 DIAGNOSIS — J302 Other seasonal allergic rhinitis: Secondary | ICD-10-CM | POA: Diagnosis present

## 2019-08-16 DIAGNOSIS — E8889 Other specified metabolic disorders: Secondary | ICD-10-CM | POA: Diagnosis present

## 2019-08-16 DIAGNOSIS — M1990 Unspecified osteoarthritis, unspecified site: Secondary | ICD-10-CM | POA: Diagnosis present

## 2019-08-16 DIAGNOSIS — I252 Old myocardial infarction: Secondary | ICD-10-CM

## 2019-08-16 DIAGNOSIS — Z8249 Family history of ischemic heart disease and other diseases of the circulatory system: Secondary | ICD-10-CM

## 2019-08-16 DIAGNOSIS — I082 Rheumatic disorders of both aortic and tricuspid valves: Secondary | ICD-10-CM | POA: Diagnosis present

## 2019-08-16 DIAGNOSIS — Z794 Long term (current) use of insulin: Secondary | ICD-10-CM

## 2019-08-16 DIAGNOSIS — I2510 Atherosclerotic heart disease of native coronary artery without angina pectoris: Secondary | ICD-10-CM | POA: Diagnosis present

## 2019-08-16 DIAGNOSIS — Z89611 Acquired absence of right leg above knee: Secondary | ICD-10-CM

## 2019-08-16 DIAGNOSIS — Z79899 Other long term (current) drug therapy: Secondary | ICD-10-CM

## 2019-08-16 DIAGNOSIS — N186 End stage renal disease: Secondary | ICD-10-CM | POA: Diagnosis present

## 2019-08-16 DIAGNOSIS — K3184 Gastroparesis: Secondary | ICD-10-CM | POA: Diagnosis present

## 2019-08-16 DIAGNOSIS — R0902 Hypoxemia: Secondary | ICD-10-CM

## 2019-08-16 DIAGNOSIS — N2581 Secondary hyperparathyroidism of renal origin: Secondary | ICD-10-CM | POA: Diagnosis present

## 2019-08-16 DIAGNOSIS — Z888 Allergy status to other drugs, medicaments and biological substances status: Secondary | ICD-10-CM

## 2019-08-16 DIAGNOSIS — R569 Unspecified convulsions: Secondary | ICD-10-CM

## 2019-08-16 DIAGNOSIS — G40909 Epilepsy, unspecified, not intractable, without status epilepticus: Principal | ICD-10-CM | POA: Diagnosis present

## 2019-08-16 DIAGNOSIS — E1122 Type 2 diabetes mellitus with diabetic chronic kidney disease: Secondary | ICD-10-CM | POA: Diagnosis present

## 2019-08-16 DIAGNOSIS — Z992 Dependence on renal dialysis: Secondary | ICD-10-CM

## 2019-08-16 DIAGNOSIS — I5031 Acute diastolic (congestive) heart failure: Secondary | ICD-10-CM | POA: Diagnosis not present

## 2019-08-16 DIAGNOSIS — D631 Anemia in chronic kidney disease: Secondary | ICD-10-CM | POA: Diagnosis present

## 2019-08-16 DIAGNOSIS — E1151 Type 2 diabetes mellitus with diabetic peripheral angiopathy without gangrene: Secondary | ICD-10-CM | POA: Diagnosis present

## 2019-08-16 DIAGNOSIS — Z89612 Acquired absence of left leg above knee: Secondary | ICD-10-CM

## 2019-08-16 DIAGNOSIS — E1143 Type 2 diabetes mellitus with diabetic autonomic (poly)neuropathy: Secondary | ICD-10-CM | POA: Diagnosis present

## 2019-08-16 DIAGNOSIS — E11649 Type 2 diabetes mellitus with hypoglycemia without coma: Secondary | ICD-10-CM | POA: Diagnosis present

## 2019-08-16 DIAGNOSIS — G4733 Obstructive sleep apnea (adult) (pediatric): Secondary | ICD-10-CM | POA: Diagnosis present

## 2019-08-16 DIAGNOSIS — Z20822 Contact with and (suspected) exposure to covid-19: Secondary | ICD-10-CM | POA: Diagnosis present

## 2019-08-16 DIAGNOSIS — E669 Obesity, unspecified: Secondary | ICD-10-CM | POA: Diagnosis present

## 2019-08-16 DIAGNOSIS — I5043 Acute on chronic combined systolic (congestive) and diastolic (congestive) heart failure: Secondary | ICD-10-CM

## 2019-08-16 DIAGNOSIS — J9621 Acute and chronic respiratory failure with hypoxia: Secondary | ICD-10-CM | POA: Diagnosis present

## 2019-08-16 DIAGNOSIS — R55 Syncope and collapse: Secondary | ICD-10-CM | POA: Diagnosis present

## 2019-08-16 DIAGNOSIS — I132 Hypertensive heart and chronic kidney disease with heart failure and with stage 5 chronic kidney disease, or end stage renal disease: Secondary | ICD-10-CM | POA: Diagnosis present

## 2019-08-16 DIAGNOSIS — Z8673 Personal history of transient ischemic attack (TIA), and cerebral infarction without residual deficits: Secondary | ICD-10-CM | POA: Diagnosis not present

## 2019-08-16 DIAGNOSIS — G9340 Encephalopathy, unspecified: Secondary | ICD-10-CM | POA: Diagnosis present

## 2019-08-16 DIAGNOSIS — R0989 Other specified symptoms and signs involving the circulatory and respiratory systems: Secondary | ICD-10-CM | POA: Diagnosis present

## 2019-08-16 DIAGNOSIS — Z6829 Body mass index (BMI) 29.0-29.9, adult: Secondary | ICD-10-CM

## 2019-08-16 DIAGNOSIS — Z8701 Personal history of pneumonia (recurrent): Secondary | ICD-10-CM

## 2019-08-16 DIAGNOSIS — F329 Major depressive disorder, single episode, unspecified: Secondary | ICD-10-CM | POA: Diagnosis present

## 2019-08-16 DIAGNOSIS — Z833 Family history of diabetes mellitus: Secondary | ICD-10-CM

## 2019-08-16 LAB — BASIC METABOLIC PANEL
Anion gap: 14 (ref 5–15)
BUN: 29 mg/dL — ABNORMAL HIGH (ref 6–20)
CO2: 32 mmol/L (ref 22–32)
Calcium: 9.1 mg/dL (ref 8.9–10.3)
Chloride: 90 mmol/L — ABNORMAL LOW (ref 98–111)
Creatinine, Ser: 9.84 mg/dL — ABNORMAL HIGH (ref 0.61–1.24)
GFR calc Af Amer: 6 mL/min — ABNORMAL LOW (ref >60)
GFR calc non Af Amer: 5 mL/min — ABNORMAL LOW (ref >60)
Glucose, Bld: 227 mg/dL — ABNORMAL HIGH (ref 70–99)
Potassium: 4.7 mmol/L (ref 3.5–5.1)
Sodium: 136 mmol/L (ref 135–145)

## 2019-08-16 LAB — CBC
HCT: 51 % (ref 39.0–52.0)
HCT: 48.4 % (ref 39.0–52.0)
Hemoglobin: 16.1 g/dL (ref 13.0–17.0)
Hemoglobin: 15.6 g/dL (ref 13.0–17.0)
MCH: 31 pg (ref 26.0–34.0)
MCH: 31.2 pg (ref 26.0–34.0)
MCHC: 31.6 g/dL (ref 30.0–36.0)
MCHC: 32.2 g/dL (ref 30.0–36.0)
MCV: 98.1 fL (ref 80.0–100.0)
MCV: 96.8 fL (ref 80.0–100.0)
Platelets: 129 10*3/uL — ABNORMAL LOW (ref 150–400)
Platelets: 128 10*3/uL — ABNORMAL LOW (ref 150–400)
RBC: 5.2 MIL/uL (ref 4.22–5.81)
RBC: 5 MIL/uL (ref 4.22–5.81)
RDW: 16.5 % — ABNORMAL HIGH (ref 11.5–15.5)
RDW: 16.2 % — ABNORMAL HIGH (ref 11.5–15.5)
WBC: 5.9 10*3/uL (ref 4.0–10.5)
WBC: 5.4 10*3/uL (ref 4.0–10.5)
nRBC: 0.3 % — ABNORMAL HIGH (ref 0.0–0.2)
nRBC: 0.4 % — ABNORMAL HIGH (ref 0.0–0.2)

## 2019-08-16 LAB — CBG MONITORING, ED
Glucose-Capillary: 225 mg/dL — ABNORMAL HIGH (ref 70–99)
Glucose-Capillary: 185 mg/dL — ABNORMAL HIGH (ref 70–99)

## 2019-08-16 LAB — SARS CORONAVIRUS 2 BY RT PCR (HOSPITAL ORDER, PERFORMED IN ~~LOC~~ HOSPITAL LAB): SARS Coronavirus 2: NEGATIVE

## 2019-08-16 LAB — TROPONIN I (HIGH SENSITIVITY)
Troponin I (High Sensitivity): 225 ng/L (ref <18)
Troponin I (High Sensitivity): 270 ng/L (ref <18)

## 2019-08-16 LAB — BRAIN NATRIURETIC PEPTIDE: B Natriuretic Peptide: 1239.1 pg/mL — ABNORMAL HIGH (ref 0.0–100.0)

## 2019-08-16 LAB — PROCALCITONIN: Procalcitonin: 0.37 ng/mL

## 2019-08-16 LAB — D-DIMER, QUANTITATIVE: D-Dimer, Quant: 1.21 ug/mL-FEU — ABNORMAL HIGH (ref 0.00–0.50)

## 2019-08-16 LAB — HEMOGLOBIN A1C
Hgb A1c MFr Bld: 10.7 % — ABNORMAL HIGH (ref 4.8–5.6)
Mean Plasma Glucose: 260.39 mg/dL

## 2019-08-16 MED ORDER — SODIUM CHLORIDE 0.9% FLUSH
3.0000 mL | Freq: Two times a day (BID) | INTRAVENOUS | Status: DC
  Administered 2019-08-17 – 2019-08-19 (×6): 3 mL via INTRAVENOUS

## 2019-08-16 MED ORDER — METOCLOPRAMIDE HCL 10 MG PO TABS
5.0000 mg | ORAL_TABLET | Freq: Two times a day (BID) | ORAL | Status: DC
  Administered 2019-08-16 – 2019-08-19 (×6): 5 mg via ORAL
  Filled 2019-08-16 (×6): qty 1

## 2019-08-16 MED ORDER — LEVETIRACETAM 500 MG PO TABS
500.0000 mg | ORAL_TABLET | Freq: Every day | ORAL | Status: DC
  Administered 2019-08-17 – 2019-08-18 (×2): 500 mg via ORAL
  Filled 2019-08-16 (×2): qty 1

## 2019-08-16 MED ORDER — HYDRALAZINE HCL 20 MG/ML IJ SOLN: 10.0000 mg | Freq: Four times a day (QID) | INTRAMUSCULAR | Status: DC | PRN

## 2019-08-16 MED ORDER — BISACODYL 10 MG RE SUPP: 10.0000 mg | Freq: Every day | RECTAL | Status: DC | PRN

## 2019-08-16 MED ORDER — IOHEXOL 350 MG/ML SOLN
100.0000 mL | Freq: Once | INTRAVENOUS | Status: AC | PRN
  Administered 2019-08-16: 100 mL via INTRAVENOUS

## 2019-08-16 MED ORDER — SODIUM CHLORIDE 0.9% FLUSH
3.0000 mL | Freq: Once | INTRAVENOUS | Status: AC
  Administered 2019-08-16: 3 mL via INTRAVENOUS

## 2019-08-16 MED ORDER — INSULIN ASPART 100 UNIT/ML ~~LOC~~ SOLN
0.0000 [IU] | Freq: Four times a day (QID) | SUBCUTANEOUS | Status: DC
  Administered 2019-08-16 – 2019-08-17 (×3): 2 [IU] via SUBCUTANEOUS
  Administered 2019-08-17: 1 [IU] via SUBCUTANEOUS
  Administered 2019-08-18 (×3): 2 [IU] via SUBCUTANEOUS
  Administered 2019-08-18 – 2019-08-19 (×2): 3 [IU] via SUBCUTANEOUS

## 2019-08-16 MED ORDER — ONDANSETRON HCL 4 MG/2ML IJ SOLN: 4.0000 mg | Freq: Four times a day (QID) | INTRAMUSCULAR | Status: DC | PRN

## 2019-08-16 MED ORDER — ACETAMINOPHEN 650 MG RE SUPP: 650.0000 mg | Freq: Four times a day (QID) | RECTAL | Status: DC | PRN

## 2019-08-16 MED ORDER — ACETAMINOPHEN 325 MG PO TABS: 650.0000 mg | ORAL_TABLET | Freq: Four times a day (QID) | ORAL | Status: DC | PRN

## 2019-08-16 MED ORDER — LATANOPROST 0.005 % OP SOLN
1.0000 [drp] | Freq: Every day | OPHTHALMIC | Status: DC
  Administered 2019-08-16 – 2019-08-18 (×3): 1 [drp] via OPHTHALMIC
  Filled 2019-08-16: qty 2.5

## 2019-08-16 MED ORDER — LORAZEPAM 2 MG/ML IJ SOLN: 1.0000 mg | INTRAMUSCULAR | Status: DC | PRN

## 2019-08-16 MED ORDER — HEPARIN SODIUM (PORCINE) 5000 UNIT/ML IJ SOLN
5000.0000 [IU] | Freq: Three times a day (TID) | INTRAMUSCULAR | Status: DC
  Administered 2019-08-16 – 2019-08-19 (×7): 5000 [IU] via SUBCUTANEOUS
  Filled 2019-08-16 (×7): qty 1

## 2019-08-16 MED ORDER — AMLODIPINE BESYLATE 5 MG PO TABS: 10.0000 mg | ORAL_TABLET | Freq: Every day | ORAL | Status: DC

## 2019-08-16 MED ORDER — FERRIC CITRATE 1 GM 210 MG(FE) PO TABS: 210.0000 mg | ORAL_TABLET | Freq: Three times a day (TID) | ORAL | Status: DC

## 2019-08-16 MED ORDER — ONDANSETRON HCL 4 MG PO TABS: 4.0000 mg | ORAL_TABLET | Freq: Four times a day (QID) | ORAL | Status: DC | PRN

## 2019-08-16 MED ORDER — TIMOLOL HEMIHYDRATE 0.5 % OP SOLN: 1.0000 [drp] | Freq: Two times a day (BID) | OPHTHALMIC | Status: DC

## 2019-08-16 MED ORDER — PANTOPRAZOLE SODIUM 40 MG PO TBEC
40.0000 mg | DELAYED_RELEASE_TABLET | Freq: Every day | ORAL | Status: DC
  Administered 2019-08-16 – 2019-08-19 (×4): 40 mg via ORAL
  Filled 2019-08-16 (×4): qty 1

## 2019-08-16 MED ORDER — METOPROLOL SUCCINATE ER 25 MG PO TB24: 50.0000 mg | ORAL_TABLET | Freq: Every day | ORAL | Status: DC

## 2019-08-16 MED ORDER — LEVETIRACETAM IN NACL 1000 MG/100ML IV SOLN
1000.0000 mg | Freq: Once | INTRAVENOUS | Status: AC
  Administered 2019-08-16: 1000 mg via INTRAVENOUS
  Filled 2019-08-16: qty 100

## 2019-08-16 MED ORDER — DOCUSATE SODIUM 100 MG PO CAPS
100.0000 mg | ORAL_CAPSULE | Freq: Two times a day (BID) | ORAL | Status: DC
  Administered 2019-08-16 – 2019-08-19 (×6): 100 mg via ORAL
  Filled 2019-08-16 (×6): qty 1

## 2019-08-16 MED ORDER — RENA-VITE PO TABS
1.0000 | ORAL_TABLET | Freq: Every day | ORAL | Status: DC
  Administered 2019-08-16 – 2019-08-18 (×3): 1 via ORAL
  Filled 2019-08-16 (×3): qty 1

## 2019-08-16 MED ORDER — TIMOLOL MALEATE 0.5 % OP SOLN
1.0000 [drp] | Freq: Two times a day (BID) | OPHTHALMIC | Status: DC
  Administered 2019-08-16 – 2019-08-19 (×7): 1 [drp] via OPHTHALMIC
  Filled 2019-08-16: qty 5

## 2019-08-16 MED ORDER — LORAZEPAM 2 MG/ML IJ SOLN
0.5000 mg | Freq: Once | INTRAMUSCULAR | Status: DC
  Filled 2019-08-16: qty 1

## 2019-08-16 NOTE — ED Provider Notes (Signed)
  Provider Note MRN:  [id number]  Arrival date & time: 08/16/19    ED Course and Medical Decision Making  Assumed care from Dr. Kathrynn Humble at shift change.  Three syncopal episodes, question partial seizures, also hypoxic, awaiting CT imaging.  Will need admission.  Neuro to see.  Neuro has evaluated the patient and has ordered MRI for further evaluation.  CT scan is negative for PE but does demonstrate signs of heart failure as the etiology of patient's hypoxia.  Will admit to hospital service.    .Critical Care Performed by: Maudie Flakes, MD Authorized by: Maudie Flakes, MD   Critical care provider statement:    Critical care time (minutes):  31   Critical care was necessary to treat or prevent imminent or life-threatening deterioration of the following conditions:  Respiratory failure   Critical care was time spent personally by me on the following activities:  Discussions with consultants, evaluation of patient's response to treatment, examination of patient, ordering and performing treatments and interventions, ordering and review of laboratory studies, ordering and review of radiographic studies, pulse oximetry, re-evaluation of patient's condition, obtaining history from patient or surrogate and review of old charts   I assumed direction of critical care for this patient from another provider in my specialty: yes      Final Clinical Impressions(s) / ED Diagnoses     ICD-10-CM   1. Syncope and collapse  R55   2. Acute on chronic respiratory failure with hypoxemia (HCC)  J96.21   3. Seizure-like activity ([place])  R56.9   4. Hypoxia  R09.02     ED Discharge Orders    None      Discharge Instructions   None     Barth Kirks. [place] Small, [place] [email or web address]    Maudie Flakes, MD 08/16/19 [phone]

## 2019-08-16 NOTE — ED Notes (Signed)
Placed on supplemental O2 via n.c. for O2 sat of 78%, sats improved to 92% on 4L.

## 2019-08-16 NOTE — ED Triage Notes (Signed)
Pt reports that he had a syncopal episode while at dialysis yesterday, states that he finished his dialysis session in full. Also reports that he had another syncopal episode prior to going to dialysis, he also endorses cough and states that syncope typically happens after he has been coughing. Pt a/ox4, resp e/u, nad.

## 2019-08-16 NOTE — ED Provider Notes (Signed)
[facility] EMERGENCY DEPARTMENT Provider Note   CSN: [id number] Arrival date & time: 08/16/19  3875     History Chief Complaint  Patient presents with  . Loss of Consciousness    Howard Bell is a 54 y.o. male.  HPI    54 year old male comes in a chief complaint of loss of consciousness.  Patient has history of ESRD on HD, diabetes, bilateral above-the-knee amputation.  Patient is coming in with loss of consciousness.  He had a syncopal episode during hemodialysis yesterday.  Subsequently, the wife is noted 3 episodes of left-sided or generalized upper extremity shaking.  The last episodes were overnight and again this morning.  Patient does not recall the event.  Wife reports there is no confusion after the event.  Patient does not make any urine so no incontinence noted.   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  Patient has not had any seizure-like activity recently.  He had a stroke, but is not having any focal neurologic deficits right now.  Additionally patient is also noted to be hypoxic.  He is usually on as needed oxygen and they have not used oxygen in a long time.  However patient has been having new cough and his O2 sats were down to the 70s at home yesterday.  He is now on 2 L of oxygen.  There is no history of PE, DVT.  Past Medical History:  Diagnosis Date  . Arthritis   . Congestive heart failure (CHF) ([place])   . Depression   . 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 Diabetic gastroparesis ([place]) 11/06/2017  . ESRD (end stage renal disease) on dialysis [facility])    M,W.F dialysis  . H/O seasonal allergies   . Hypertension   . Pneumonia   . Sleep apnea    not using it now, on continuous O2 2 L during the day, 3 L during the night    Patient Active Problem List   Diagnosis Date Noted  . Abnormality of gait 01/30/2019  . S/P  AKA (above knee amputation) bilateral ([place]) 11/07/2018  . Chronic hypercapnic respiratory failure ([place]) 10/02/2018  . OSA (obstructive sleep apnea)   . S/P AKA (above knee amputation) unilateral, right ([place])   . Diabetic peripheral neuropathy ([place])   . Status post bilateral above knee amputation ([place]) 09/08/2018  . Atherosclerosis of native arteries of extremities with gangrene, left leg ([place]) 09/05/2018  . Peripheral arterial disease ([place])   . Unilateral AKA, right ([place]) 08/17/2018  . Drug induced constipation   . Uncontrolled diabetes mellitus type 2 with peripheral artery disease ([place])   . Anemia of chronic disease   . Diabetes mellitus type 2 in nonobese (HCC)   . Supplemental oxygen dependent   . Unilateral AKA, left ([place]) 08/07/2018  . S/P BKA (below knee amputation), right ([place]) 08/04/2018  . Critical limb ischemia with history of revascularization of same extremity 05/09/2018  . 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 ([place]) 11/16/2017  . CKD stage 5 due to type 2 diabetes mellitus ([place]) 11/16/2017  . Glaucoma due to type 2 diabetes mellitus ([place]) 11/16/2017  . Chronic generalized pain 11/16/2017  . Recurrent pneumonia 11/06/2017  . Diabetic gastroparesis ([place]) 11/06/2017  . Generalized abdominal pain 09/13/2017  .  Pain in right shoulder 07/26/2017  . ESRD on dialysis ([place]) 03/28/2017  . Anemia due to end stage renal disease ([place]) 08/04/2016    Past Surgical History:  Procedure Laterality Date  . AMPUTATION Right 05/14/2018   Procedure: AMPUTATION BELOW KNEE;  Surgeon: Newt Minion, MD;  Location: [facility];  Service: Orthopedics;  Laterality: Right;  . AMPUTATION Right 08/04/2018   Procedure: RIGHT ABOVE KNEE AMPUTATION;  Surgeon: Newt Minion, MD;  Location: [facility];  Service: Orthopedics;  Laterality: Right;  . AMPUTATION Left 09/08/2018    Procedure: LEFT ABOVE KNEE AMPUTATION;  Surgeon: Newt Minion, MD;  Location: [facility];  Service: Orthopedics;  Laterality: Left;  . APPLICATION OF WOUND VAC Left 09/08/2018   Procedure: Application Of Wound Vac;  Surgeon: Newt Minion, MD;  Location: [facility];  Service: Orthopedics;  Laterality: Left;  . ESOPHAGOGASTRODUODENOSCOPY (EGD) WITH PROPOFOL N/A 12/05/2017   Procedure: ESOPHAGOGASTRODUODENOSCOPY (EGD) WITH PROPOFOL;  Surgeon: Doran Stabler, MD;  Location: [facility];  Service: Gastroenterology;  Laterality: N/A;  . HERNIA REPAIR    . IR AV DIALY SHUNT INTRO NEEDLE/INTRACATH INITIAL W/PTA/IMG LEFT  03/30/2017       Family History  Problem Relation Age of Onset  . Diabetes Mother   . Hypertension Father     Social History   Tobacco Use  . Smoking status: Never Smoker  . Smokeless tobacco: Never Used  Substance Use Topics  . Alcohol use: No  . Drug use: No    Home Medications Prior to Admission medications   Medication Sig Start Date End Date Taking? Authorizing Provider  albuterol (PROVENTIL HFA;VENTOLIN HFA) 108 (90 Base) MCG/ACT inhaler Inhale 1 puff into the lungs every 4 (four) hours as needed for wheezing or shortness of breath.    Yes [provider]  amLODipine (NORVASC) 10 MG tablet Take 10 mg by mouth daily.   Yes [provider]  docusate sodium (COLACE) 100 MG capsule Take 1 capsule (100 mg total) by mouth 2 (two) times daily. 01/22/19  Yes Sagardia, Ines Bloomer, MD  fluticasone [facility]) 50 MCG/ACT nasal spray Place 1 spray into both nostrils daily as needed for allergies.    Yes [provider]  insulin lispro (ADMELOG SOLOSTAR) 100 UNIT/ML KwikPen 3 times a day (just before each meal), 6-6-8 units, and pen needles 3/day Patient taking differently: Inject 6-8 Units into the skin See admin instructions. Pt does not use as instructed. Wife gives insulin and does a sliding scaled method. She reported she doses him as follows:   If BG  is:  100, 0 units  200, 2 units 300, 2 units  350, 3 units 04/22/19  Yes Renato Shin, MD  latanoprost (XALATAN) 0.005 % ophthalmic solution Place 1 drop into both eyes at bedtime.   Yes [provider]  metoCLOPramide (REGLAN) 5 MG tablet Take 1 tablet (5 mg total) by mouth 3 (three) times daily before meals. Patient taking differently: Take 5 mg by mouth 2 (two) times daily before a meal.  08/30/18  Yes Love, Ivan Anchors, PA-C  metoprolol succinate (TOPROL-XL) 50 MG 24 hr tablet Take 50 mg by mouth at bedtime. Take with or immediately following a meal.   Yes [provider]  multivitamin (RENA-VIT) TABS tablet Take 1 tablet by mouth at bedtime. 01/22/19  Yes Sagardia, Ines Bloomer, MD  omeprazole (PRILOSEC) 20 MG capsule Take 1 capsule (20 mg total) by mouth at bedtime. 01/22/19  Yes Sagardia, Ines Bloomer, MD  timolol (BETIMOL) 0.5 % ophthalmic solution Place 1 drop into both eyes 2 (two) times daily. 05/07/17  Yes Ivar Drape D, PA  acetaminophen (TYLENOL) 325 MG tablet Take 1-2 tablets (325-650 mg total) by mouth every 4 (four) hours as needed for mild pain. Patient not taking: Reported on 08/16/2019 09/26/18   Love, Ivan Anchors, PA-C  amLODipine (NORVASC) 10 MG tablet Take 1 tablet (10 mg total) by mouth daily. Patient not taking: Reported on 08/16/2019 05/17/19 08/15/19  Horald Pollen, MD  ferric citrate (AURYXIA) 1 GM 210 MG(Fe) tablet Take 1 tablet (210 mg total) by mouth 3 (three) times daily with meals. Patient not taking: Reported on 08/16/2019 09/29/18   Bary Leriche, PA-C  glucose blood test strip 1 each by Other route as needed for other. Use as instructed 10/31/18   Horald Pollen, MD  Lancets (ACCU-CHEK SOFT [facility]) lancets Use as instructed 10/31/18   Horald Pollen, MD  metoprolol succinate (TOPROL-XL) 50 MG 24 hr tablet Take 1 tablet (50 mg total) by mouth at bedtime. Take with a meal. Patient not taking: Reported on 08/16/2019 01/22/19 04/22/19   Horald Pollen, MD    Allergies    Gabapentin  Review of Systems   Review of Systems  Constitutional: Positive for activity change.  Respiratory: Positive for shortness of breath.   Cardiovascular: Negative for chest pain.  Gastrointestinal: Negative for nausea and vomiting.  All other systems reviewed and are negative.   Physical Exam Updated Vital Signs BP 136/88   Pulse 87   Temp 98.6 F (37 C) (Oral)   Resp (!) 21   SpO2 96%   Physical Exam Vitals and nursing note reviewed.  Constitutional:      Appearance: He is well-developed.  HENT:     Head: Atraumatic.  Cardiovascular:     Rate and Rhythm: Normal rate.  Pulmonary:     Effort: Pulmonary effort is normal.  Musculoskeletal:     Cervical back: Neck supple.  Skin:    General: Skin is warm.  Neurological:     Mental Status: He is alert and oriented to person, place, and time.     ED Results / Procedures / Treatments   Labs (all labs ordered are listed, but only abnormal results are displayed) Labs Reviewed  BASIC METABOLIC PANEL - Abnormal; Notable for the following components:      Result Value   Chloride 90 (*)    Glucose, Bld 227 (*)    BUN 29 (*)    Creatinine, Ser 9.84 (*)    GFR calc non Af Amer 5 (*)    GFR calc Af Amer 6 (*)    All other components within normal limits  CBC - Abnormal; Notable for the following components:   RDW 16.5 (*)    Platelets 129 (*)    nRBC 0.3 (*)    All other components within normal limits  D-DIMER, QUANTITATIVE (NOT AT [facility]) - Abnormal; Notable for the following components:   D-Dimer, Quant 1.21 (*)    All other components within normal limits  CBG MONITORING, ED - Abnormal; Notable for the following components:   Glucose-Capillary 225 (*)    All other components within normal limits  SARS CORONAVIRUS 2 BY RT PCR [facility] ORDER, [place] LAB)    EKG EKG Interpretation  Date/Time:  Thursday Aug 16 2019 08:52:13  EDT Ventricular Rate:  88 PR Interval:  194 QRS Duration: 100 QT Interval:  416  QTC Calculation: 503 R Axis:   -116 Text Interpretation: Normal sinus rhythm Right superior axis deviation Possible Anterior infarct , age undetermined Prolonged QT Abnormal ECG No acute changes No significant change since last tracing Confirmed by Varney Biles [phone]) on 08/16/2019 10:46:21 AM   Radiology DG Chest Port 1 View  Result Date: 08/16/2019 CLINICAL DATA:  Cough. EXAM: PORTABLE CHEST 1 VIEW COMPARISON:  09/09/2018 and 08/23/2018 FINDINGS: Lungs are hypoinflated and demonstrate hazy opacification over the right mid to lower lung which is not significantly changed compared to the previous exams and may be due to mild chronic prominence of the bronchovascular markings are mild interstitial disease. No acute lobar consolidation or effusion. Stable cardiomegaly. Left subclavian stent unchanged. Remainder of the exam is unchanged. IMPRESSION: 1. No acute findings. Chronic hazy opacification over the right mid to lower. 2.  Cardiomegaly. Electronically Signed   By: Marin Olp M.D.   On: 08/16/2019 11:17    Procedures .Critical Care Performed by: Varney Biles, MD Authorized by: Varney Biles, MD   Critical care provider statement:    Critical care time (minutes):  35   Critical care was necessary to treat or prevent imminent or life-threatening deterioration of the following conditions:  Circulatory failure and respiratory failure   Critical care was time spent personally by me on the following activities:  Discussions with consultants, evaluation of patient's response to treatment, examination of patient, ordering and performing treatments and interventions, ordering and review of laboratory studies, ordering and review of radiographic studies, pulse oximetry, re-evaluation of patient's condition, obtaining history from patient or surrogate and review of old charts   (including critical care  time)  Medications Ordered in ED Medications  iohexol (OMNIPAQUE) 350 MG/ML injection 100 mL (has no administration in time range)  LORazepam (ATIVAN) injection 0.5 mg (has no administration in time range)  sodium chloride flush (NS) 0.9 % injection 3 mL (3 mLs Intravenous Given 08/16/19 0955)    ED Course  I have reviewed the triage vital signs and the nursing notes.  Pertinent labs & imaging results that were available during my care of the patient were reviewed by me and considered in my medical decision making (see chart for details).    MDM Rules/Calculators/A&P                      Patient comes in a chief complaint of syncope and seizure-like activity.  He has multiple medical comorbidities.  It also appears that he is having new oxygen requirement and is been complaining of cough.  Patient had COVID-19 earlier in the year, now he is fully vaccinated.  For the seizures, It seems like there is history of seizures.  DDx: -Seizure disorder -Meningitis -Trauma -ICH -Electrolyte abnormality -Metabolic derangement -Stroke -Toxin induced seizures -Medication side effects -Hypoxia -Hypoglycemia  It is surely possible that the hypoxia, which is new, could be driving the seizure-like activity.  We will consult neurology.  CT scan of the brain is not showing any acute changes.  Dr. Lorraine Lax, neurology will see the patient.  Additionally, it is unclear why patient is having new oxygen requirement.  X-rays not revealing any pneumonia or signs of CHF or volume overload.  CT PE ordered to ensure there is no PE or pneumonia that is not picked up on the x-ray.  Labs and lung exam are reassuring however.  CT results are pending at this time.  Dr. Sedonia Small to take over and follow-up on the results and  admit the patient.  Final Clinical Impression(s) / ED Diagnoses Final diagnoses:  Syncope and collapse  Acute on chronic respiratory failure with hypoxemia ([place])  Seizure-like activity ([place])     Rx / DC Orders ED Discharge Orders    None       Varney Biles, MD 08/16/19 1552

## 2019-08-16 NOTE — H&P (Signed)
TRH H&P   Patient Demographics:    Roderic Lammert, is a 54 y.o. male  MRN: [id number]   DOB - 31-Jan-1966  Admit Date - 08/16/2019  Outpatient Primary MD for the patient is Horald Pollen, MD    Patient coming from: Home  Chief Complaint  Patient presents with  . Loss of Consciousness      HPI:    Semaj Coburn  is a 54 y.o. male, with history of ESRD on Monday Wednesday Friday dialysis schedule finished dialysis yesterday, obstructive sleep apnea supposed to use oxygen at night but not compliant, DM type II, diabetic gastroparesis, arthritis, bilateral AKA done by Dr. Sharol Given, chronic diastolic heart failure with EF 65% on echocardiogram done 1 year ago, history of seizures in the past and was taking Dilantin but was stopped several years ago.  Patient with above history went for dialysis yesterday where he had an episode of shakes followed by unresponsiveness lasting several minutes, he finished his dialysis then went home and had 3-4 similar episodes last night and one today, according to his wife he would shake for 2 to 3 minutes and then become less responsive for another few minutes thereafter he would wake up and proceed for normal activity, sometimes these episodes were preceded by mild cough, he never complained of any chest pain or palpitations.  No headache.  No other problems.    His wife got concerned and brought him to the ER where he underwent CT scan of the head, EKG, chest x-ray and these were unremarkable.  A pulse ox probe was placed on his fingers which showed a low reading, upon application of 3 L of oxygen his oxygenation is better and he has no respiratory complaints or distress.  Of note  wife states that patient has very poor peripheral circulation and whenever they check pulse ox and fingers it is low and it is usually normal on earlobes.  Patient in the ER is slightly somnolent but able to answer basic questions he denies any headache, no chest or abdominal pain, denies any shortness of breath, denies any productive cough, no fever or chills.  He states he feels tired but otherwise no subjective complaints.  He is being admitted for work-up of possible ongoing seizures.   Review of systems:   A full 10 point Review of  Systems was done, except as stated above, all other Review of Systems were negative.   With Past History of the following :    Past Medical History:  Diagnosis Date  . Arthritis   . Congestive heart failure (CHF) ([place])   . Depression   . 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 Diabetic gastroparesis ([place]) 11/06/2017  . ESRD (end stage renal disease) on dialysis [facility])    M,W.F dialysis  . H/O seasonal allergies   . Hypertension   . Pneumonia   . Sleep apnea    not using it now, on continuous O2 2 L during the day, 3 L during the night      Past Surgical History:  Procedure Laterality Date  . AMPUTATION Right 05/14/2018   Procedure: AMPUTATION BELOW KNEE;  Surgeon: Newt Minion, MD;  Location: [place];  Service: Orthopedics;  Laterality: Right;  . AMPUTATION Right 08/04/2018   Procedure: RIGHT ABOVE KNEE AMPUTATION;  Surgeon: Newt Minion, MD;  Location: [place];  Service: Orthopedics;  Laterality: Right;  . AMPUTATION Left 09/08/2018   Procedure: LEFT ABOVE KNEE AMPUTATION;  Surgeon: Newt Minion, MD;  Location: [place];  Service: Orthopedics;  Laterality: Left;  . APPLICATION OF WOUND VAC Left 09/08/2018   Procedure: Application Of Wound Vac;  Surgeon: Newt Minion, MD;  Location: [place];  Service: Orthopedics;  Laterality: Left;  . ESOPHAGOGASTRODUODENOSCOPY (EGD) WITH PROPOFOL N/A 12/05/2017   Procedure:  ESOPHAGOGASTRODUODENOSCOPY (EGD) WITH PROPOFOL;  Surgeon: Doran Stabler, MD;  Location: [facility];  Service: Gastroenterology;  Laterality: N/A;  . HERNIA REPAIR    . IR AV DIALY SHUNT INTRO NEEDLE/INTRACATH INITIAL W/PTA/IMG LEFT  03/30/2017      Social History:     Social History   Tobacco Use  . Smoking status: Never Smoker  . Smokeless tobacco: Never Used  Substance Use Topics  . Alcohol use: No        Family History :     Family History  Problem Relation Age of Onset  . Diabetes Mother   . Hypertension Father        Home Medications:   Prior to Admission medications   Medication Sig Start Date End Date Taking? Authorizing Provider  albuterol (PROVENTIL HFA;VENTOLIN HFA) 108 (90 Base) MCG/ACT inhaler Inhale 1 puff into the lungs every 4 (four) hours as needed for wheezing or shortness of breath.    Yes [provider]  amLODipine (NORVASC) 10 MG tablet Take 10 mg by mouth daily.   Yes [provider]  docusate sodium (COLACE) 100 MG capsule Take 1 capsule (100 mg total) by mouth 2 (two) times daily. 01/22/19  Yes Sagardia, Ines Bloomer, MD  fluticasone [facility]) 50 MCG/ACT nasal spray Place 1 spray into both nostrils daily as needed for allergies.    Yes [provider]  insulin lispro (ADMELOG SOLOSTAR) 100 UNIT/ML KwikPen 3 times a day (just before each meal), 6-6-8 units, and pen needles 3/day Patient taking differently: Inject 6-8 Units into the skin See admin instructions. Pt does not use as instructed. Wife gives insulin and does a sliding scaled method. She reported she doses him as follows:   If BG is:  100, 0 units  200, 2 units 300, 2 units  350, 3 units 04/22/19  Yes Renato Shin, MD  latanoprost (XALATAN) 0.005 % ophthalmic solution Place 1 drop into both eyes at bedtime.   Yes [provider]  metoCLOPramide (REGLAN) 5 MG tablet Take 1 tablet (5 mg total) by mouth 3 (three) times daily before meals. Patient taking  differently: Take 5 mg by mouth 2 (two) times daily before a meal.  08/30/18  Yes Love, Ivan Anchors, PA-C  metoprolol succinate (TOPROL-XL) 50 MG 24 hr tablet Take 50 mg by mouth at bedtime. Take with or immediately following a meal.   Yes [provider]  multivitamin (RENA-VIT) TABS tablet Take 1 tablet by mouth at bedtime. 01/22/19  Yes Sagardia, Ines Bloomer, MD  omeprazole (PRILOSEC) 20 MG capsule Take 1 capsule (20 mg total) by mouth at bedtime. 01/22/19  Yes Sagardia, Ines Bloomer, MD  timolol (BETIMOL) 0.5 % ophthalmic solution Place 1 drop into both eyes 2 (two) times daily. 05/07/17  Yes Ivar Drape D, PA  acetaminophen (TYLENOL) 325 MG tablet Take 1-2 tablets (325-650 mg total) by mouth every 4 (four) hours as needed for mild pain. Patient not taking: Reported on 08/16/2019 09/26/18   Love, Ivan Anchors, PA-C  amLODipine (NORVASC) 10 MG tablet Take 1 tablet (10 mg total) by mouth daily. Patient not taking: Reported on 08/16/2019 05/17/19 08/15/19  Horald Pollen, MD  ferric citrate (AURYXIA) 1 GM 210 MG(Fe) tablet Take 1 tablet (210 mg total) by mouth 3 (three) times daily with meals. Patient not taking: Reported on 08/16/2019 09/29/18   Bary Leriche, PA-C  glucose blood test strip 1 each by Other route as needed for other. Use as instructed 10/31/18   Horald Pollen, MD  Lancets (ACCU-CHEK SOFT [facility]) lancets Use as instructed 10/31/18   Horald Pollen, MD  metoprolol succinate (TOPROL-XL) 50 MG 24 hr tablet Take 1 tablet (50 mg total) by mouth at bedtime. Take with a meal. Patient not taking: Reported on 08/16/2019 01/22/19 04/22/19  Horald Pollen, MD     Allergies:     Allergies  Allergen Reactions  . Gabapentin Other (See Comments)    Altered mental status     Physical Exam:   Vitals  Blood pressure 136/88, pulse 87, temperature 98.6 F (37 C), temperature source Oral, resp. rate (!) 21, SpO2 96 %.   1. General middle-aged obese African-American  male lying in hospital bed undergoing continuous EEG, appears slightly somnolent but in no distress,  2. Normal affect and insight, Not Suicidal or Homicidal, Awake Alert,   3. No F.N deficits, ALL C.Nerves Intact, Strength 5/5 upper two extremities, bilateral AKA,  4. Ears and Eyes appear Normal, Conjunctivae clear, PERRLA. Moist Oral Mucosa.  5. Supple Neck, No JVD, No cervical lymphadenopathy appriciated, No Carotid Bruits.  6. Symmetrical Chest wall movement, Good air movement bilaterally, CTAB.  7. RRR, No Gallops, Rubs or Murmurs, No Parasternal Heave.  8. Positive Bowel Sounds, Abdomen Soft, No tenderness, No organomegaly appriciated,No rebound -guarding or rigidity.  9.  No Cyanosis, Normal Skin Turgor, No Skin Rash or Bruise.  10. Good muscle tone,  joints appear normal , no effusions, Normal ROM.  11. No Palpable Lymph Nodes in Neck or Axillae      Data Review:    CBC Recent Labs  Lab 08/16/19 0903  WBC 5.9  HGB 16.1  HCT 51.0  PLT 129*  MCV 98.1  MCH 31.0  MCHC 31.6  RDW 16.5*   ------------------------------------------------------------------------------------------------------------------  Chemistries  Recent Labs  Lab 08/16/19 0903  NA 136  K 4.7  CL 90*  CO2 32  GLUCOSE 227*  BUN 29*  CREATININE 9.84*  CALCIUM 9.1   ------------------------------------------------------------------------------------------------------------------  CrCl cannot be calculated (Unknown ideal weight.). ------------------------------------------------------------------------------------------------------------------ No results for input(s): TSH, T4TOTAL, T3FREE, THYROIDAB in the last 72 hours.  Invalid input(s): FREET3  Coagulation profile No results for input(s): INR, PROTIME in the last 168 hours. ------------------------------------------------------------------------------------------------------------------- Recent Labs    08/16/19 1121  DDIMER 1.21*    -------------------------------------------------------------------------------------------------------------------  Cardiac Enzymes No results for input(s): CKMB, TROPONINI, MYOGLOBIN in the last 168 hours.  Invalid input(s): CK ------------------------------------------------------------------------------------------------------------------    Component Value Date/Time   BNP 226.8 (H) 05/02/2018 1930     ---------------------------------------------------------------------------------------------------------------  Urinalysis    Component Value Date/Time   COLORURINE RED (A) 11/19/2016 0020   APPEARANCEUR TURBID (A) 11/19/2016 0020   LABSPEC  11/19/2016 0020    TEST NOT REPORTED DUE TO COLOR INTERFERENCE OF URINE PIGMENT   PHURINE  11/19/2016 0020    TEST NOT REPORTED DUE TO COLOR INTERFERENCE OF URINE PIGMENT   GLUCOSEU (A) 11/19/2016 0020    TEST NOT REPORTED DUE TO COLOR INTERFERENCE OF URINE PIGMENT   HGBUR (A) 11/19/2016 0020    TEST NOT REPORTED DUE TO COLOR INTERFERENCE OF URINE PIGMENT   BILIRUBINUR (A) 11/19/2016 0020    TEST NOT REPORTED DUE TO COLOR INTERFERENCE OF URINE PIGMENT   KETONESUR (A) 11/19/2016 0020    TEST NOT REPORTED DUE TO COLOR INTERFERENCE OF URINE PIGMENT   PROTEINUR (A) 11/19/2016 0020    TEST NOT REPORTED DUE TO COLOR INTERFERENCE OF URINE PIGMENT   NITRITE (A) 11/19/2016 0020    TEST NOT REPORTED DUE TO COLOR INTERFERENCE OF URINE PIGMENT   LEUKOCYTESUR (A) 11/19/2016 0020    TEST NOT REPORTED DUE TO COLOR INTERFERENCE OF URINE PIGMENT    ----------------------------------------------------------------------------------------------------------------   Imaging Results:    CT Head Wo Contrast  Result Date: 08/16/2019 CLINICAL DATA:  Syncope at dialysis yesterday, cough EXAM: CT HEAD WITHOUT CONTRAST TECHNIQUE: Contiguous axial images were obtained from the base of the skull through the vertex without intravenous contrast. COMPARISON:   08/15/2018 FINDINGS: Brain: Chronic ischemic changes are seen within the right cerebellar hemisphere and left occipital lobe, with encephalomalacia and ex vacuo dilatation of the occipital horn of the left lateral ventricle. Small chronic right parietal cortical infarct is again noted and stable. No sign of acute infarct or hemorrhage. The lateral ventricles and midline structures are stable. No acute extra-axial fluid collections. No mass effect. Vascular: No hyperdense vessel or unexpected calcification. Skull: Normal. Negative for fracture or focal lesion. Sinuses/Orbits: No acute finding. Other: None. IMPRESSION: 1. Stable chronic ischemic changes involving the right cerebellar hemisphere, left occipital lobe, and right parietal cortex. 2. No acute infarct or hemorrhage. Electronically Signed   By: Randa Ngo M.D.   On: 08/16/2019 16:52   CT Angio Chest PE W and/or Wo Contrast  Result Date: 08/16/2019 CLINICAL DATA:  Syncope yesterday at dialysis, hypoxia, cough, worsening shortness of breath EXAM: CT ANGIOGRAPHY CHEST WITH CONTRAST TECHNIQUE: Multidetector CT imaging of the chest was performed using the standard protocol during bolus administration of intravenous contrast. Multiplanar CT image reconstructions and MIPs were obtained to evaluate the vascular anatomy. CONTRAST:  162mL OMNIPAQUE IOHEXOL 350 MG/ML SOLN COMPARISON:  08/16/2019 FINDINGS: Cardiovascular: This is a technically adequate evaluation of the pulmonary vasculature. No filling defects or pulmonary emboli. The heart is enlarged without pericardial effusion. Normal caliber of the thoracic aorta. There is extensive atherosclerosis within the aortic arch and coronary vasculature. Stable stents within the left subclavian and axillary veins. 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 Mediastinum/Nodes: Lymphadenopathy involving  the subcarinal and right paratracheal regions is stable. Trachea and esophagus are unremarkable. The  thyroid is normal. Lungs/Pleura: There is bilateral ground-glass airspace disease, with denser consolidation in the right lower lobe. No effusion or pneumothorax. Central airways are patent. Upper Abdomen: Reflux of contrast into the hepatic veins may suggest cardiac dysfunction. No other upper abdominal findings. 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 No acute or destructive bony abnormalities. There are numerous venous collaterals within the right chest wall consistent with chronic right brachiocephalic venous occlusion. Reconstructed images demonstrate no additional findings. Review of the MIP images confirms the above findings. IMPRESSION: 1. No evidence of pulmonary embolus. 2. Bilateral ground-glass airspace disease, with denser consolidation in the right lower lobe. I would favor congestive heart failure given the corresponding findings of cardiomegaly and reflux of contrast into the hepatic veins. 3. Stable nonspecific mediastinal lymphadenopathy. 4. 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 5.  Aortic Atherosclerosis (ICD10-I70.0). Electronically Signed   By: Randa Ngo M.D.   On: 08/16/2019 16:57   DG Chest Port 1 View  Result Date: 08/16/2019 CLINICAL DATA:  Cough. EXAM: PORTABLE CHEST 1 VIEW COMPARISON:  09/09/2018 and 08/23/2018 FINDINGS: Lungs are hypoinflated and demonstrate hazy opacification over the right mid to lower lung which is not significantly changed compared to the previous exams and may be due to mild chronic prominence of the bronchovascular markings are mild interstitial disease. No acute lobar consolidation or effusion. Stable cardiomegaly. Left subclavian stent unchanged. Remainder of the exam is unchanged. IMPRESSION: 1. No acute findings. Chronic hazy opacification over the right mid to lower. 2.  Cardiomegaly. Electronically Signed   By: Marin Olp M.D.   On: 08/16/2019  11:17    My personal review of EKG: Rhythm NSR, no Acute ST changes 9 QTC of 500 ms   Assessment & Plan:      1.  Multiple seizure-like episodes.  In a patient with history of seizures who was on Dilantin previously which was stopped several years ago, his episodes sound like ongoing seizure activity, he has no headache, no new focal deficits, head CTA unremarkable, has been started on IV Keppra per neuro along with prolonged EEG.  Will be monitored.  Seizure precautions, feeding assistance and aspiration precautions, keep head of the bed elevated at 35 degrees at all times, for now liquid diet only.  2.  ESRD.  On MWF schedule, finished dialysis yesterday confirmed by patient and wife, nephrology has been consulted will be seen tomorrow.  3.  Question of patient being slightly hypoxic upon arrival .  He has no subjective complaints of shortness of breath or chest pain, no productive cough, per wife he has extremely poor circulation in his fingers and always oxygen readings are poor in his fingers, currently he is on 3 L nasal cannula oxygen pulse ox is 100%, will request staff to leave him on 2 L as he is supposed to be on it for sleep apnea anyways and applied the probe to his ears.  Will monitor BMP.    4.  Diastolic CHF with EF of 36% on echocardiogram 1 year ago.  He had one episode yesterday where he coughed and then briefly passed out, wife did not see a seizure-like activity that time however she does not recall it completely, this could be a syncopal episode although I doubt, will monitor blood pressures, will check echocardiogram and trend troponin.  Note with his ESRD troponin could  be slightly elevated but trend will be more important.  5.  DM type II.  Check A1c placed him on every 6 sliding scale for now.  6.  History of bilateral BKA.  Supportive care.  7.  Hypertension.  Blood pressure borderline, for now as needed hydralazine only.  Holding blood pressure medications to  completely rule out hypotension and syncope.    DVT Prophylaxis Heparin   AM Labs Ordered, also please review Full Orders  Family Communication: Admission, patients condition and plan of care including tests being ordered have been discussed with the patient and wife who indicate understanding and agree with the plan and Code Status.  Code Status Full  Likely DC to  TBD  Condition GUARDED    Consults called: Renal, Neuro  Admission status: Inpt    Time spent in minutes : 35   Lala Lund M.D on 08/16/2019 at 5:48 PM  To page go to [email or web address] - password [email or web address]

## 2019-08-16 NOTE — Progress Notes (Signed)
vLTM started Neurology notified.  Pt in ER when moved to room will notify [facility] to monitor pt Event button tested

## 2019-08-16 NOTE — ED Notes (Signed)
Pt began coughing in triage, had short episode where he became unresponsive for approx 10 seconds with full body jerking movement. Pt was immediately able to talk with this RN following the episode. Remains a/ox4.

## 2019-08-16 NOTE — Consult Note (Addendum)
NEUROLOGY CONSULT  Reason for Consult: Seizures Referring Physician: Dr. Kathrynn Humble  CC: seizures  HPI: Howard Bell is an 54 y.o. 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 Pt has no recollection of events and is currently post-ictal and poor historian, thus most of the history is given by wife at bedside. She tells me he had a staring spell that lasted a few min during HD yesterday. They monitored him and since he seemed to be back to normal, no further attention to this matter and he was sent home post HD. Later, at home, he had a full GCT event and was so sleepy after he went to bed. Today he had two more generlized events, one in which wife says he had an odd right arm movement where it was stiff and stuck in the air. As a child he evidently had seizures and was on Dilantan for a while, but no further information known per wife. He has not had any seizures reported in adult life till now.   Past Medical History Past Medical History:  Diagnosis Date  . Arthritis   . Congestive heart failure (CHF) ([place])   . Depression   . 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 Diabetic gastroparesis ([place]) 11/06/2017  . ESRD (end stage renal disease) on dialysis [facility])    M,W.F dialysis  . H/O seasonal allergies   . Hypertension   . Pneumonia   . Sleep apnea    not using it now, on continuous O2 2 L during the day, 3 L during the night    Past Surgical History Past Surgical History:  Procedure Laterality Date  . AMPUTATION Right 05/14/2018   Procedure: AMPUTATION BELOW KNEE;  Surgeon: Newt Minion, MD;  Location: [place];  Service: Orthopedics;  Laterality: Right;  . AMPUTATION Right 08/04/2018   Procedure: RIGHT ABOVE KNEE AMPUTATION;  Surgeon: Newt Minion, MD;  Location: [place];  Service: Orthopedics;  Laterality: Right;  . AMPUTATION Left 09/08/2018   Procedure: LEFT  ABOVE KNEE AMPUTATION;  Surgeon: Newt Minion, MD;  Location: [place];  Service: Orthopedics;  Laterality: Left;  . APPLICATION OF WOUND VAC Left 09/08/2018   Procedure: Application Of Wound Vac;  Surgeon: Newt Minion, MD;  Location: [place];  Service: Orthopedics;  Laterality: Left;  . ESOPHAGOGASTRODUODENOSCOPY (EGD) WITH PROPOFOL N/A 12/05/2017   Procedure: ESOPHAGOGASTRODUODENOSCOPY (EGD) WITH PROPOFOL;  Surgeon: Doran Stabler, MD;  Location: [facility];  Service: Gastroenterology;  Laterality: N/A;  . HERNIA REPAIR    . IR AV DIALY SHUNT INTRO NEEDLE/INTRACATH INITIAL W/PTA/IMG LEFT  03/30/2017    Family History Family History  Problem Relation Age of Onset  . Diabetes Mother   . Hypertension Father     Social History    reports that he has never smoked. He has never used smokeless tobacco. He reports that he does not drink alcohol or use drugs.  Allergies Allergies  Allergen Reactions  . Gabapentin Other (See Comments)    Altered mental status    Home Medications (Not in a hospital admission)   Hospital Medications . LORazepam  0.5 mg Intravenous Once     ROS: History obtained from wife  General ROS: negative for - chills, fatigue, fever, night sweats, weight gain or weight loss Psychological ROS: negative for - behavioral disorder, hallucinations, memory difficulties, mood swings or  suicidal ideation Ophthalmic ROS: negative for - blurry vision, double vision, eye pain or loss of vision ENT ROS: negative for - epistaxis, nasal discharge, oral lesions, sore throat, tinnitus or vertigo Allergy and Immunology ROS: negative for - hives or itchy/watery eyes Hematological and Lymphatic ROS: negative for - bleeding problems, bruising or swollen lymph nodes Endocrine ROS: negative for - galactorrhea, hair pattern changes, polydipsia/polyuria or temperature intolerance Respiratory ROS: negative for - cough, hemoptysis, shortness of breath or wheezing Cardiovascular  ROS: negative for - chest pain, dyspnea on exertion, edema or irregular heartbeat Gastrointestinal ROS: negative for - abdominal pain, diarrhea, hematemesis, nausea/vomiting or stool incontinence Genito-Urinary ROS: negative for - dysuria, hematuria, incontinence or urinary frequency/urgency Musculoskeletal ROS: negative for - joint swelling or muscular weakness Neurological ROS: as noted in HPI Dermatological ROS: negative for rash and skin lesion changes   Physical Examination:  Vitals:   08/16/19 1030 08/16/19 1045 08/16/19 1100 08/16/19 1120  BP:  (!) 126/93 136/88   Pulse: 87 85 87   Resp: (!) 27 (!) 24 (!) 21   Temp:    98.6 F (37 C)  TempSrc:    Oral  SpO2: 100% 95% 96%     General - mild distress Heart - Regular rate and rhythm - no murmer Lungs - Clear to auscultation Abdomen - Soft - non tender Extremities - bilat amputee - no edema Skin - Warm and dry  Neurologic Examination:   Mental Status:  Somnolent, but will alert to voice, orients except for date wrong. Poorly attends, had to ask to stay awake during exam. Speech is slow, slight dysarthria. No aphasia. Able to follow 3 step commands with some difficulty; considerable psychomotor slowling.  Cranial Nerves:  II-bilateral visual fields intact III/IV/VI-Pupils were equal and reacted. Extraocular movements were full.  V/VII-no facial numbness and no facial weakness.  VIII-hearing normal.  X-normal speech and symmetrical palatal movement.  XII-midline tongue extension  Motor: Right : Upper extremity   5/5    Left:     Upper extremity   5/5   Tone and bulk:normal tone throughout; no atrophy noted. Sensory: Intact to light touch in all extremities. Deep Tendon Reflexes: n/a Plantars: n/a Cerebellar: Normal finger to nose . Gait: n/a w/c bound   LABORATORY STUDIES:  Basic Metabolic Panel: Recent Labs  Lab 08/16/19 0903  NA 136  K 4.7  CL 90*  CO2 32  GLUCOSE 227*  BUN 29*  CREATININE 9.84*   CALCIUM 9.1    Liver Function Tests: No results for input(s): AST, ALT, ALKPHOS, BILITOT, PROT, ALBUMIN in the last 168 hours. No results for input(s): LIPASE, AMYLASE in the last 168 hours. No results for input(s): AMMONIA in the last 168 hours.  CBC: Recent Labs  Lab 08/16/19 0903  WBC 5.9  HGB 16.1  HCT 51.0  MCV 98.1  PLT 129*    Cardiac Enzymes: No results for input(s): CKTOTAL, CKMB, CKMBINDEX, TROPONINI in the last 168 hours.  BNP: Invalid input(s): POCBNP  CBG: Recent Labs  Lab 08/16/19 0938  GLUCAP 225*    Microbiology:   Coagulation Studies: No results for input(s): LABPROT, INR in the last 72 hours.  Urinalysis: No results for input(s): COLORURINE, LABSPEC, PHURINE, GLUCOSEU, HGBUR, BILIRUBINUR, KETONESUR, PROTEINUR, UROBILINOGEN, NITRITE, LEUKOCYTESUR in the last 168 hours.  Invalid input(s): APPERANCEUR  Lipid Panel:     Component Value Date/Time   TRIG 172 (H) 09/09/2018 0330    HgbA1C:  Lab Results  Component Value Date  HGBA1C 7.3 (A) 05/17/2019    Urine Drug Screen:  No results found for: LABOPIA, COCAINSCRNUR, LABBENZ, AMPHETMU, THCU, LABBARB   Alcohol Level:  No results for input(s): ETH in the last 168 hours.  Miscellaneous labs:  EKG  EKG  IMAGING: DG Chest Port 1 View  Result Date: 08/16/2019 CLINICAL DATA:  Cough. EXAM: PORTABLE CHEST 1 VIEW COMPARISON:  09/09/2018 and 08/23/2018 FINDINGS: Lungs are hypoinflated and demonstrate hazy opacification over the right mid to lower lung which is not significantly changed compared to the previous exams and may be due to mild chronic prominence of the bronchovascular markings are mild interstitial disease. No acute lobar consolidation or effusion. Stable cardiomegaly. Left subclavian stent unchanged. Remainder of the exam is unchanged. IMPRESSION: 1. No acute findings. Chronic hazy opacification over the right mid to lower. 2.  Cardiomegaly. Electronically Signed   By: Marin Olp  M.D.   On: 08/16/2019 11:17     Assessment/Plan: 54yr old male with h/o MI/Stroke in 44s, childhood seizures, but none in adulthood and has not been on ASD.   # Seizures- will need cEEG to evaluate type of seizures # Encephalopathy- post ictal/seizures. Already clearing # ESRD on HD- per renal team. Will dose keppra for QHS so it is not given before HD in daytime  Plan:  MRI brain cEEG IV keppra1000mg   now, then renal dose of 500mg  QHS Seizure precautions; no driving Updated with wife at bedside  Attending neurologist's note to follow  Desiree Metzger-Cihelka, ARNP-C, ANVP-BC Pager: [phone]   NEUROHOSPITALIST ADDENDUM Performed a face to face diagnostic evaluation.   I have reviewed the contents of history and physical exam as documented by PA/ARNP/Resident and agree with above documentation.  I have discussed and formulated the above plan as documented. Edits to the note have been made as needed.  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 Based on description of spells, this could be complex partial seizures.  Was previously on Dilantin however has been off seizure medications in his adult life.  We will obtain long-term EEG.  Patient received 1 g of Keppra and will start 500 mg nightly based on renal dose.  MRI brain after 24hr EEG,.       Karena Addison Howard Lineman MD [facility] [phone]   If 7pm to 7am, please call on call as listed on AMION.

## 2019-08-16 NOTE — Progress Notes (Signed)
EEG complete - results pending LTM to follow same leads used

## 2019-08-16 NOTE — Progress Notes (Signed)
LTM EEG hooked up and running - no initial skin breakdown - push button tested - neuro notified.[facility] monitor. Same leads used

## 2019-08-17 ENCOUNTER — Inpatient Hospital Stay (HOSPITAL_COMMUNITY): Payer: Medicare Other

## 2019-08-17 DIAGNOSIS — J9621 Acute and chronic respiratory failure with hypoxia: Secondary | ICD-10-CM

## 2019-08-17 DIAGNOSIS — I5031 Acute diastolic (congestive) heart failure: Secondary | ICD-10-CM

## 2019-08-17 LAB — CBC
HCT: 45.3 % (ref 39.0–52.0)
Hemoglobin: 15 g/dL (ref 13.0–17.0)
MCH: 31.5 pg (ref 26.0–34.0)
MCHC: 33.1 g/dL (ref 30.0–36.0)
MCV: 95.2 fL (ref 80.0–100.0)
Platelets: UNDETERMINED 10*3/uL (ref 150–400)
RBC: 4.76 MIL/uL (ref 4.22–5.81)
RDW: 16.1 % — ABNORMAL HIGH (ref 11.5–15.5)
WBC: 5.8 10*3/uL (ref 4.0–10.5)
nRBC: 0.7 % — ABNORMAL HIGH (ref 0.0–0.2)

## 2019-08-17 LAB — COMPREHENSIVE METABOLIC PANEL
ALT: 27 U/L (ref 0–44)
AST: 25 U/L (ref 15–41)
Albumin: 3.2 g/dL — ABNORMAL LOW (ref 3.5–5.0)
Alkaline Phosphatase: 77 U/L (ref 38–126)
Anion gap: 15 (ref 5–15)
BUN: 42 mg/dL — ABNORMAL HIGH (ref 6–20)
CO2: 27 mmol/L (ref 22–32)
Calcium: 8.7 mg/dL — ABNORMAL LOW (ref 8.9–10.3)
Chloride: 91 mmol/L — ABNORMAL LOW (ref 98–111)
Creatinine, Ser: 11.86 mg/dL — ABNORMAL HIGH (ref 0.61–1.24)
GFR calc Af Amer: 5 mL/min — ABNORMAL LOW (ref >60)
GFR calc non Af Amer: 4 mL/min — ABNORMAL LOW (ref >60)
Glucose, Bld: 126 mg/dL — ABNORMAL HIGH (ref 70–99)
Potassium: 5.1 mmol/L (ref 3.5–5.1)
Sodium: 133 mmol/L — ABNORMAL LOW (ref 135–145)
Total Bilirubin: 1.1 mg/dL (ref 0.3–1.2)
Total Protein: 7.3 g/dL (ref 6.5–8.1)

## 2019-08-17 LAB — GLUCOSE, CAPILLARY
Glucose-Capillary: 117 mg/dL — ABNORMAL HIGH (ref 70–99)
Glucose-Capillary: 117 mg/dL — ABNORMAL HIGH (ref 70–99)
Glucose-Capillary: 114 mg/dL — ABNORMAL HIGH (ref 70–99)
Glucose-Capillary: 128 mg/dL — ABNORMAL HIGH (ref 70–99)
Glucose-Capillary: 181 mg/dL — ABNORMAL HIGH (ref 70–99)
Glucose-Capillary: 165 mg/dL — ABNORMAL HIGH (ref 70–99)

## 2019-08-17 LAB — TROPONIN I (HIGH SENSITIVITY): Troponin I (High Sensitivity): 226 ng/L (ref <18)

## 2019-08-17 LAB — CREATININE, SERUM
Creatinine, Ser: 11.38 mg/dL — ABNORMAL HIGH (ref 0.61–1.24)
GFR calc Af Amer: 5 mL/min — ABNORMAL LOW (ref >60)
GFR calc non Af Amer: 5 mL/min — ABNORMAL LOW (ref >60)

## 2019-08-17 LAB — RENAL FUNCTION PANEL
Albumin: 3.2 g/dL — ABNORMAL LOW (ref 3.5–5.0)
Anion gap: 13 (ref 5–15)
BUN: 41 mg/dL — ABNORMAL HIGH (ref 6–20)
CO2: 30 mmol/L (ref 22–32)
Calcium: 8.7 mg/dL — ABNORMAL LOW (ref 8.9–10.3)
Chloride: 91 mmol/L — ABNORMAL LOW (ref 98–111)
Creatinine, Ser: 12.04 mg/dL — ABNORMAL HIGH (ref 0.61–1.24)
GFR calc Af Amer: 5 mL/min — ABNORMAL LOW (ref >60)
GFR calc non Af Amer: 4 mL/min — ABNORMAL LOW (ref >60)
Glucose, Bld: 143 mg/dL — ABNORMAL HIGH (ref 70–99)
Phosphorus: 8.4 mg/dL — ABNORMAL HIGH (ref 2.5–4.6)
Potassium: 5.3 mmol/L — ABNORMAL HIGH (ref 3.5–5.1)
Sodium: 134 mmol/L — ABNORMAL LOW (ref 135–145)

## 2019-08-17 LAB — TSH: TSH: 5.715 u[IU]/mL — ABNORMAL HIGH (ref 0.350–4.500)

## 2019-08-17 LAB — MRSA PCR SCREENING: MRSA by PCR: NEGATIVE

## 2019-08-17 LAB — HIV ANTIBODY (ROUTINE TESTING W REFLEX): HIV Screen 4th Generation wRfx: NONREACTIVE

## 2019-08-17 LAB — ECHOCARDIOGRAM COMPLETE: Weight: 3262.81 oz

## 2019-08-17 MED ORDER — SEVELAMER CARBONATE 800 MG PO TABS
2400.0000 mg | ORAL_TABLET | Freq: Three times a day (TID) | ORAL | Status: DC
  Administered 2019-08-17 – 2019-08-19 (×6): 2400 mg via ORAL
  Filled 2019-08-17 (×7): qty 3

## 2019-08-17 MED ORDER — CHLORHEXIDINE GLUCONATE CLOTH 2 % EX PADS
6.0000 | MEDICATED_PAD | Freq: Every day | CUTANEOUS | Status: DC
  Administered 2019-08-17 – 2019-08-18 (×3): 6 via TOPICAL

## 2019-08-17 MED ORDER — PERFLUTREN LIPID MICROSPHERE
1.0000 mL | INTRAVENOUS | Status: AC | PRN
  Administered 2019-08-17: 4 mL via INTRAVENOUS
  Filled 2019-08-17: qty 10

## 2019-08-17 MED ORDER — CINACALCET HCL 30 MG PO TABS
60.0000 mg | ORAL_TABLET | ORAL | Status: DC
  Administered 2019-08-18: 60 mg via ORAL
  Filled 2019-08-17 (×2): qty 2

## 2019-08-17 MED ORDER — DOXERCALCIFEROL 4 MCG/2ML IV SOLN
4.0000 ug | INTRAVENOUS | Status: DC
  Filled 2019-08-17: qty 2

## 2019-08-17 NOTE — Progress Notes (Signed)
  Echocardiogram 2D Echocardiogram has been performed.  Howard Bell 08/17/2019, 5:40 PM

## 2019-08-17 NOTE — Consult Note (Signed)
[facility] Renal Consultation Note    Indication for Consultation:  Management of ESRD/hemodialysis; anemia, hypertension/volume and secondary hyperparathyroidism PCP:  HPI: Howard Bell is a 54 y.o. male with ESRD on hemodialysis MWF at [facility]. ESRD 2/2  DM/HTN. PMH significant for DM, type 2 HD s OSA on CPAP, CVA, MI, osteomylitis L ring finger, seizure disorder as a child formerly on dilantin,  PVD S/P bilateral AKA.AOCD,SHPT. Last HD 08/15/2019. He ran full treatment, left 0.1 above OP EDW. He is compliant with HD, does not miss or shorten treatments.   He presented to ED after having shaking episodes at home followed by unresponsiveness at home. CT of head without acute abnormalities. Labs unremarkable for hemodialysis patient.  CXR and EKG unremarkable. He has been seen by neurology who has loaded with keppra and started maintenance dose. He has been admitted for AMS, ongoing seizure episodes.   Seen in room, EEG in progress. He has pulled multiple leads off his scalp. He is oriented to person and place only which is NOT unusual for him. He is at baseline, a very poor historian. He cannot recall events that lead to admission.  He appears lethargic, cannot contribute in meaningful way to HPI. He does call me by name, denies SOB, pain, discomfort.    Past Medical History:  Diagnosis Date  . Arthritis   . Congestive heart failure (CHF) ([place])   . Depression   . 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 Diabetic gastroparesis ([place]) 11/06/2017  . ESRD (end stage renal disease) on dialysis [facility])    M,W.F dialysis  . H/O seasonal allergies   . Hypertension   . Pneumonia   . Sleep apnea    not using it now, on continuous O2 2 L during the day, 3 L during the night   Past Surgical History:  Procedure Laterality Date  . AMPUTATION Right 05/14/2018   Procedure: AMPUTATION BELOW KNEE;  Surgeon: Newt Minion, MD;   Location: [place];  Service: Orthopedics;  Laterality: Right;  . AMPUTATION Right 08/04/2018   Procedure: RIGHT ABOVE KNEE AMPUTATION;  Surgeon: Newt Minion, MD;  Location: [place];  Service: Orthopedics;  Laterality: Right;  . AMPUTATION Left 09/08/2018   Procedure: LEFT ABOVE KNEE AMPUTATION;  Surgeon: Newt Minion, MD;  Location: [place];  Service: Orthopedics;  Laterality: Left;  . APPLICATION OF WOUND VAC Left 09/08/2018   Procedure: Application Of Wound Vac;  Surgeon: Newt Minion, MD;  Location: [place];  Service: Orthopedics;  Laterality: Left;  . ESOPHAGOGASTRODUODENOSCOPY (EGD) WITH PROPOFOL N/A 12/05/2017   Procedure: ESOPHAGOGASTRODUODENOSCOPY (EGD) WITH PROPOFOL;  Surgeon: Doran Stabler, MD;  Location: [facility];  Service: Gastroenterology;  Laterality: N/A;  . HERNIA REPAIR    . IR AV DIALY SHUNT INTRO NEEDLE/INTRACATH INITIAL W/PTA/IMG LEFT  03/30/2017   Family History  Problem Relation Age of Onset  . Diabetes Mother   . Hypertension Father    Social History:  reports that he has never smoked. He has never used smokeless tobacco. He reports that he does not drink alcohol or use drugs. Allergies  Allergen Reactions  . Gabapentin Other (See Comments)    Altered mental status   Prior to Admission medications   Medication Sig Start Date End Date Taking? Authorizing Provider  albuterol (PROVENTIL HFA;VENTOLIN HFA) 108 (90 Base) MCG/ACT inhaler Inhale 1 puff into the lungs every 4 (four) hours as needed for wheezing or shortness  of breath.    Yes [provider]  amLODipine (NORVASC) 10 MG tablet Take 1 tablet (10 mg total) by mouth daily. 05/17/19 08/17/19 Yes Sagardia, Ines Bloomer, MD  docusate sodium (COLACE) 100 MG capsule Take 1 capsule (100 mg total) by mouth 2 (two) times daily. 01/22/19  Yes Sagardia, Ines Bloomer, MD  fluticasone [facility]) 50 MCG/ACT nasal spray Place 1 spray into both nostrils daily as needed for allergies.    Yes [provider]   insulin lispro (ADMELOG SOLOSTAR) 100 UNIT/ML KwikPen 3 times a day (just before each meal), 6-6-8 units, and pen needles 3/day Patient taking differently: Inject 6-8 Units into the skin See admin instructions. Per sliding scale method. Doses as follows:  If BG is:  100, 0 units  200, 2 units 300, 2 units  350, 3 units 04/22/19  Yes Renato Shin, MD  latanoprost (XALATAN) 0.005 % ophthalmic solution Place 1 drop into both eyes at bedtime.   Yes [provider]  metoCLOPramide (REGLAN) 5 MG tablet Take 1 tablet (5 mg total) by mouth 3 (three) times daily before meals. Patient taking differently: Take 5 mg by mouth 2 (two) times daily before a meal.  08/30/18  Yes Love, Ivan Anchors, PA-C  metoprolol succinate (TOPROL-XL) 50 MG 24 hr tablet Take 50 mg by mouth at bedtime. Take with or immediately following a meal.   Yes [provider]  multivitamin (RENA-VIT) TABS tablet Take 1 tablet by mouth at bedtime. 01/22/19  Yes Sagardia, Ines Bloomer, MD  omeprazole (PRILOSEC) 20 MG capsule Take 1 capsule (20 mg total) by mouth at bedtime. 01/22/19  Yes Sagardia, Ines Bloomer, MD  timolol (BETIMOL) 0.5 % ophthalmic solution Place 1 drop into both eyes 2 (two) times daily. 05/07/17  Yes Ivar Drape D, PA  acetaminophen (TYLENOL) 325 MG tablet Take 1-2 tablets (325-650 mg total) by mouth every 4 (four) hours as needed for mild pain. Patient not taking: Reported on 08/16/2019 09/26/18   Love, Ivan Anchors, PA-C  ferric citrate (AURYXIA) 1 GM 210 MG(Fe) tablet Take 1 tablet (210 mg total) by mouth 3 (three) times daily with meals. Patient not taking: Reported on 08/16/2019 09/29/18   Bary Leriche, PA-C  glucose blood test strip 1 each by Other route as needed for other. Use as instructed 10/31/18   Horald Pollen, MD  Lancets (ACCU-CHEK SOFT [facility]) lancets Use as instructed 10/31/18   Horald Pollen, MD  metoprolol succinate (TOPROL-XL) 50 MG 24 hr tablet Take 1 tablet (50 mg total) by  mouth at bedtime. Take with a meal. Patient not taking: Reported on 08/16/2019 01/22/19 04/22/19  Horald Pollen, MD   Current Facility-Administered Medications  Medication Dose Route Frequency Provider Last Rate Last Admin  . acetaminophen (TYLENOL) tablet 650 mg  650 mg Oral Q6H PRN Thurnell Lose, MD       Or  . acetaminophen (TYLENOL) suppository 650 mg  650 mg Rectal Q6H PRN Thurnell Lose, MD      . bisacodyl (DULCOLAX) suppository 10 mg  10 mg Rectal Daily PRN Thurnell Lose, MD      . Chlorhexidine Gluconate Cloth 2 % PADS 6 each  6 each Topical Q0600 Valentina Gu, NP   6 each at 08/17/19 1055  . cinacalcet (SENSIPAR) tablet 60 mg  60 mg Oral Q M,W,F-HD Valentina Gu, NP      . docusate sodium (COLACE) capsule 100 mg  100 mg Oral BID Lala Lund  K, MD   100 mg at 08/17/19 1055  . doxercalciferol (HECTOROL) injection 4 mcg  4 mcg Intravenous Q M,W,F-HD Valentina Gu, NP      . heparin injection 5,000 Units  5,000 Units Subcutaneous Q8H Thurnell Lose, MD   5,000 Units at 08/17/19 1302  . hydrALAZINE (APRESOLINE) injection 10 mg  10 mg Intravenous Q6H PRN Lala Lund K, MD      . insulin aspart (novoLOG) injection 0-9 Units  0-9 Units Subcutaneous Q6H Thurnell Lose, MD   1 Units at 08/17/19 1259  . latanoprost (XALATAN) 0.005 % ophthalmic solution 1 drop  1 drop Both Eyes QHS Thurnell Lose, MD   1 drop at 08/16/19 2312  . levETIRAcetam (KEPPRA) tablet 500 mg  500 mg Oral QHS Lala Lund K, MD      . LORazepam (ATIVAN) injection 1 mg  1 mg Intravenous Q4H PRN Thurnell Lose, MD      . metoCLOPramide (REGLAN) tablet 5 mg  5 mg Oral BID AC Thurnell Lose, MD   5 mg at 08/17/19 1054  . multivitamin (RENA-VIT) tablet 1 tablet  1 tablet Oral QHS Thurnell Lose, MD   1 tablet at 08/16/19 2313  . ondansetron (ZOFRAN) tablet 4 mg  4 mg Oral Q6H PRN Thurnell Lose, MD       Or  . ondansetron (ZOFRAN) injection 4 mg  4 mg  Intravenous Q6H PRN Thurnell Lose, MD      . pantoprazole (PROTONIX) EC tablet 40 mg  40 mg Oral Daily Thurnell Lose, MD   40 mg at 08/17/19 1054  . sevelamer carbonate (RENVELA) tablet 2,400 mg  2,400 mg Oral TID WC Valentina Gu, NP   2,400 mg at 08/17/19 1258  . sodium chloride flush (NS) 0.9 % injection 3 mL  3 mL Intravenous Q12H Thurnell Lose, MD   3 mL at 08/17/19 1116  . timolol (TIMOPTIC) 0.5 % ophthalmic solution 1 drop  1 drop Both Eyes BID Thurnell Lose, MD   1 drop at 08/17/19 1055   Labs: Basic Metabolic Panel: Recent Labs  Lab 08/16/19 0903 08/16/19 0903 08/16/19 2258 08/17/19 0828 08/17/19 1012  NA 136  --   --  133* 134*  K 4.7  --   --  5.1 5.3*  CL 90*  --   --  91* 91*  CO2 32  --   --  27 30  GLUCOSE 227*  --   --  126* 143*  BUN 29*  --   --  42* 41*  CREATININE 9.84*   < > 11.38* 11.86* 12.04*  CALCIUM 9.1  --   --  8.7* 8.7*  PHOS  --   --   --   --  8.4*   < > = values in this interval not displayed.   Liver Function Tests: Recent Labs  Lab 08/17/19 0828 08/17/19 1012  AST 25  --   ALT 27  --   ALKPHOS 77  --   BILITOT 1.1  --   PROT 7.3  --   ALBUMIN 3.2* 3.2*   No results for input(s): LIPASE, AMYLASE in the last 168 hours. No results for input(s): AMMONIA in the last 168 hours. CBC: Recent Labs  Lab 08/16/19 0903 08/16/19 2258 08/17/19 0407  WBC 5.9 5.4 5.8  HGB 16.1 15.6 15.0  HCT 51.0 48.4 45.3  MCV 98.1 96.8 95.2  PLT 129* 128* PLATELET  CLUMPS NOTED ON SMEAR, UNABLE TO ESTIMATE   Cardiac Enzymes: No results for input(s): CKTOTAL, CKMB, CKMBINDEX, TROPONINI in the last 168 hours. CBG: Recent Labs  Lab 08/16/19 1841 08/17/19 0025 08/17/19 0401 08/17/19 0618 08/17/19 1219  GLUCAP 185* 114* 117* 117* 128*   Iron Studies: No results for input(s): IRON, TIBC, TRANSFERRIN, FERRITIN in the last 72 hours. Studies/Results: CT Head Wo Contrast  Result Date: 08/16/2019 CLINICAL DATA:  Syncope at dialysis  yesterday, cough EXAM: CT HEAD WITHOUT CONTRAST TECHNIQUE: Contiguous axial images were obtained from the base of the skull through the vertex without intravenous contrast. COMPARISON:  08/15/2018 FINDINGS: Brain: Chronic ischemic changes are seen within the right cerebellar hemisphere and left occipital lobe, with encephalomalacia and ex vacuo dilatation of the occipital horn of the left lateral ventricle. Small chronic right parietal cortical infarct is again noted and stable. No sign of acute infarct or hemorrhage. The lateral ventricles and midline structures are stable. No acute extra-axial fluid collections. No mass effect. Vascular: No hyperdense vessel or unexpected calcification. Skull: Normal. Negative for fracture or focal lesion. Sinuses/Orbits: No acute finding. Other: None. IMPRESSION: 1. Stable chronic ischemic changes involving the right cerebellar hemisphere, left occipital lobe, and right parietal cortex. 2. No acute infarct or hemorrhage. Electronically Signed   By: Randa Ngo M.D.   On: 08/16/2019 16:52   CT Angio Chest PE W and/or Wo Contrast  Result Date: 08/16/2019 CLINICAL DATA:  Syncope yesterday at dialysis, hypoxia, cough, worsening shortness of breath EXAM: CT ANGIOGRAPHY CHEST WITH CONTRAST TECHNIQUE: Multidetector CT imaging of the chest was performed using the standard protocol during bolus administration of intravenous contrast. Multiplanar CT image reconstructions and MIPs were obtained to evaluate the vascular anatomy. CONTRAST:  183mL OMNIPAQUE IOHEXOL 350 MG/ML SOLN COMPARISON:  08/16/2019 FINDINGS: Cardiovascular: This is a technically adequate evaluation of the pulmonary vasculature. No filling defects or pulmonary emboli. The heart is enlarged without pericardial effusion. Normal caliber of the thoracic aorta. There is extensive atherosclerosis within the aortic arch and coronary vasculature. Stable stents within the left subclavian and axillary veins. 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 Mediastinum/Nodes: Lymphadenopathy involving the subcarinal and right paratracheal regions is stable. Trachea and esophagus are unremarkable. The thyroid is normal. Lungs/Pleura: There is bilateral ground-glass airspace disease, with denser consolidation in the right lower lobe. No effusion or pneumothorax. Central airways are patent. Upper Abdomen: Reflux of contrast into the hepatic veins may suggest cardiac dysfunction. No other upper abdominal findings. 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 No acute or destructive bony abnormalities. There are numerous venous collaterals within the right chest wall consistent with chronic right brachiocephalic venous occlusion. Reconstructed images demonstrate no additional findings. Review of the MIP images confirms the above findings. IMPRESSION: 1. No evidence of pulmonary embolus. 2. Bilateral ground-glass airspace disease, with denser consolidation in the right lower lobe. I would favor congestive heart failure given the corresponding findings of cardiomegaly and reflux of contrast into the hepatic veins. 3. Stable nonspecific mediastinal lymphadenopathy. 4. 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 5.  Aortic Atherosclerosis (ICD10-I70.0). Electronically Signed   By: Randa Ngo M.D.   On: 08/16/2019 16:57   DG Chest Port 1 View  Result Date: 08/16/2019 CLINICAL DATA:  Cough. EXAM: PORTABLE CHEST 1 VIEW COMPARISON:  09/09/2018 and 08/23/2018 FINDINGS: Lungs are hypoinflated and demonstrate hazy opacification over the right mid to lower lung which is not significantly changed compared to the  previous exams and may be due to mild chronic prominence of the bronchovascular markings are mild interstitial disease. No acute lobar consolidation or effusion. Stable cardiomegaly. Left subclavian stent unchanged. Remainder of the exam is  unchanged. IMPRESSION: 1. No acute findings. Chronic hazy opacification over the right mid to lower. 2.  Cardiomegaly. Electronically Signed   By: Marin Olp M.D.   On: 08/16/2019 11:17   EEG adult  Result Date: 08/17/2019 Lora Havens, MD     08/17/2019  8:58 AM Patient Name: Howard Bell MRN: [id number] Epilepsy Attending: Lora Havens Referring Physician/Provider: Dr. Karena Addison Aroor Date: 08/16/2019 Duration: 21.08 minutes Patient history: 53 year old male with staring episodes concerning for seizures.  Also had generalized tonic-clonic seizure-like episode.  EEG to evaluate for seizures. Level of alertness: Awake AEDs during EEG study: Keppra Technical aspects: This EEG study was done with scalp electrodes positioned according to the 10-20 International system of electrode placement. Electrical activity was acquired at a sampling rate of 500Hz  and reviewed with a high frequency filter of 70Hz  and a low frequency filter of 1Hz . EEG data were recorded continuously and digitally stored. Description: During awake state, no clear posterior dominant rhythm was seen.  EEG showed continuous generalized polymorphic mixed frequencies with predominantly 6 to 8 Hz theta-alpha activity as well as intermittent 2 to 3 Hz delta activity.  Hyperventilation and photic stimulation were not performed.   ABNORMALITY -Continuous slow, generalized IMPRESSION: This study is suggestive of mild diffuse encephalopathy, nonspecific etiology. No seizures or epileptiform discharges were seen throughout the recording. Priyanka Barbra Sarks   Overnight EEG with video  Result Date: 08/17/2019 Lora Havens, MD     08/17/2019 10:14 AM Patient Name: Howard Bell MRN: [id number] Epilepsy Attending: Lora Havens Referring Physician/Provider: Dr. Karena Addison Aroor Duration: 08/16/2019 1701 to 08/17/2019 0900  Patient history: 55 year old male with staring episodes concerning for seizures.  Also had generalized tonic-clonic  seizure-like episode.  EEG to evaluate for seizures.  Level of alertness: Awake, asleep  AEDs during EEG study: Keppra  Technical aspects: This EEG study was done with scalp electrodes positioned according to the 10-20 International system of electrode placement. Electrical activity was acquired at a sampling rate of 500Hz  and reviewed with a high frequency filter of 70Hz  and a low frequency filter of 1Hz . EEG data were recorded continuously and digitally stored.  Description: The posterior dominant rhythm consists of 9-10 Hz activity of moderate voltage (25-35 uV) seen predominantly in posterior head regions, symmetric and reactive to eye opening and eye closing. Sleep was characterized by vertex waves, sleep spindles (12 to 14 Hz), maximal frontocentral region and posterior occipital sharp transients of sleep.  Hyperventilation and photic stimulation were not performed.   1 event was recorded at 1:32 on 08/17/2019.  Patient was laying in bed watching TV.  When nurse walked in and asked patient a question, he initially did not respond and was noted to have brief nonrhythmic jerking movement of bilateral upper extremity.  He eventually responded to the nurses question and asked if this event is being captured.  Concomitant EEG review before, during and after the event did not show any EEG change to suggest seizure.  IMPRESSION: This study is within normal limits. No seizures or epileptiform discharges were seen throughout the recording. One episode of decreased responsiveness was recorded as described above around 1:32 AM on 08/17/2019 without concomitant EEG change and was a nonepileptic event. Priyanka O Yadav    ROS: As per HPI otherwise  negative.   Physical Exam: Vitals:   08/17/19 0342 08/17/19 0600 08/17/19 0735 08/17/19 1222  BP: 107/76  (!) 141/87 130/82  Pulse: 78  79 84  Resp: 20  18 18   Temp: 97.7 F (36.5 C)  97.6 F (36.4 C) 98.5 F (36.9 C)  TempSrc: Oral  Oral Oral  SpO2: 97%  97%    Weight:  92.5 kg       General: Chronically ill appearing older male in NAD. Head: Normocephalic, atraumatic, sclera anicteric, mucus membranes are moist Neck: Supple. JVD not elevated. Lungs: Clear bilaterally to auscultation without wheezes, rales, or rhonchi. Breathing is unlabored. Heart: RRR with S1 S2. No murmurs, rubs, or gallops appreciated. Abdomen: Soft, non-tender, non-distended with normoactive bowel sounds. No rebound/guarding. No obvious abdominal masses. Lower extremities: Bilateral AKA no stump edema  Neuro: Alert and oriented X 3. Moves all extremities spontaneously. Psych:  Responds to questions appropriately with a normal affect. Dialysis Access: L AVG + bruit  Dialysis Orders: AF MWF 4 hrs 15 min 180NRe 450/Autoflow 2.0 90 Kg 2.0K/2.25 Ca UFP 2 AVG -Heparin 6000 units IV initial bolus Heparin 3000 units IV mid run -Sensipar 60 mg po TIW -Hectorol 4 mcg IV TIW   Assessment/Plan: 1.  AMS with multiple seizure-like episodes. Per primary/Neurology 2.  ESRD -  MWF. HD today on schedule. K+ 5.3. Appropriate to 2.0 K bath,usual heparin.  3.  Hypertension/volume  - BP variable, actually on low side for him. No evidence of volume overload by exam. UF as tolerated 2.5-3 liters.  4.  Anemia  -HGB 16. Changed from Auryxia binders to renvela at OP center.  5.  Metabolic bone disease -  Ca 8.7 C Ca 9.3 PO4 8.4. 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 Continue binders/VDRA. 6.  Nutrition - Renal/Carb mod diet. Albumin 3.2. Add nepro/renal vits 7. DM-per primary 8.   HFpEF/CAD  Jimmye Norman. Owens Shark, NP-C 08/17/2019, 2:58 PM  [employer] [phone]

## 2019-08-17 NOTE — Progress Notes (Signed)
Had an episode of staring up into the air and looking confused for a couple seconds but stopped when this nurse started talking to him.  He asked if he had a seizure.  When I asked him what symptoms he had or if he had an aura, he said he did not know.  When asked if he thought he had a seizure, he said he did not know because he does not know when he has them.  I then asked him then what made him feel like he had a seizure and he said he did not know.  He did the same thing maybe an hour before when I entered the room as well.  When I started talking to him, he stopped.  When he had this episode, he was able to recall purple cows and answered all of the orientation questions.  He was ok immediately afterwards.

## 2019-08-17 NOTE — Progress Notes (Signed)
PROGRESS NOTE                                                                                                                                                                                                             Patient Demographics:    Mazen Marcin, is a 54 y.o. male, DOB - 1965-11-14, LFY:[id number]  Outpatient Primary MD for the patient is Horald Pollen, MD    LOS - 1  Admit date - 08/16/2019    Chief Complaint  Patient presents with  . Loss of Consciousness       Brief Narrative  Hendry Speas  is a 54 y.o. 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   Subjective:    [name] today has, No headache, No chest pain, No abdominal pain - No Nausea, No new weakness tingling or numbness, no cough or SOB, still drowsy.   Assessment  & Plan :     1.  AMS ongoing with Multiple seizure-like episodes.  In a patient with history of seizures who was on Dilantin previously which was stopped several years ago - CTA head nonacute with no stroke, continues to be somnolent but arousable, currently on IV Keppra and continuous EEG, will defer further management of this problem to neurology, still unsafe to go home or even out of the bed without assistance.  2.  ESRD.  On MWF schedule, finished dialysis yesterday confirmed by patient and wife, nephrology has been consulted .  3.  Question of patient being slightly hypoxic upon  arrival .    Likely false reading due to poor circulation in extremities, comfortable on 2 L, no acute issues we will continue to monitor.    4.  Diastolic CHF with EF of 85% on echocardiogram 1 year ago.    Decompensated fluid removal via dialysis, echo pending, troponin trend flat and non-ACS pattern with no ACS concern, EKG  stable no chest pain, likely baseline troponin due to underlying ESRD.  5.  Hypertension.  Blood pressure borderline, for now as needed hydralazine only.  Holding blood pressure medications to completely rule out hypotension and syncope.  6.  History of bilateral BKA.  Supportive care.  7. DM type II.  Check A1c placed him on every 6 sliding scale for now.  Lab Results  Component Value Date   HGBA1C 10.7 (H) 08/16/2019   CBG (last 3)  Recent Labs    08/17/19 0025 08/17/19 0401 08/17/19 0618  GLUCAP 114* 117* 117*       Condition - Fair  Family Communication  :  Wife in detail 08/16/19  Code Status :  Full  Consults  :  Renal, Neuro  Procedures  :    CTA Head - Non acute  CT Chest - possible CHF but no PE.,  Chronic occlusion of right brachiocephalic vein.  PUD Prophylaxis : PPI  Disposition Plan  :    Status is: Inpatient  Remains inpatient appropriate because:Altered mental status   Dispo: The patient is from: Home              Anticipated d/c is to: Home              Anticipated d/c date is: 3 days              Patient currently is not medically stable to d/c. Continued AMS   DVT Prophylaxis  :   Heparin   Lab Results  Component Value Date   PLT PLATELET CLUMPS NOTED ON SMEAR, UNABLE TO ESTIMATE 08/17/2019    Diet :  Diet Order            Diet heart healthy/carb modified Room service appropriate? Yes; Fluid consistency: Thin; Fluid restriction: 1200 mL Fluid  Diet effective now               Inpatient Medications  Scheduled Meds: . docusate sodium  100 mg Oral BID  . heparin  5,000 Units Subcutaneous Q8H  .  insulin aspart  0-9 Units Subcutaneous Q6H  . latanoprost  1 drop Both Eyes QHS  . levETIRAcetam  500 mg Oral QHS  . metoCLOPramide  5 mg Oral BID AC  . multivitamin  1 tablet Oral QHS  . pantoprazole  40 mg Oral Daily  . sodium chloride flush  3 mL Intravenous Q12H  . timolol  1 drop Both Eyes BID   Continuous Infusions: PRN Meds:.acetaminophen **OR** acetaminophen, bisacodyl, hydrALAZINE, LORazepam, ondansetron **OR** ondansetron (ZOFRAN) IV  Antibiotics  :    Anti-infectives (From admission, onward)   None       Time Spent in minutes  30   Lala Lund M.D on 08/17/2019 at 9:24 AM  To page go to [email or web address] - password [facility]  [facility] -  Office  [phone]   See all Orders from today for further details    Objective:   Vitals:   08/17/19 0142 08/17/19 0342 08/17/19 0600 08/17/19 0735  BP: 104/66 107/76  (!) 141/87  Pulse: 88 78  79  Resp: 20 20  18   Temp: 98.3 F (36.8 C) 97.7 F (36.5 C)  97.6 F (36.4 C)  TempSrc:  Oral  Oral  SpO2: 95% 97%  97%  Weight:   92.5 kg     Wt Readings from Last 3 Encounters:  08/17/19 92.5 kg  06/12/19 99.8 kg  04/17/19 91.6 kg     Intake/Output Summary (  Last 24 hours) at 08/17/2019 0924 Last data filed at 08/17/2019 0500 Gross per 24 hour  Intake 360 ml  Output --  Net 360 ml     Physical Exam  Somnolent but arousable,, No new F.N deficits, Normal affect .AT,PERRAL Supple Neck,No JVD, No cervical lymphadenopathy appriciated.  Symmetrical Chest wall movement, Good air movement bilaterally, CTAB RRR,No Gallops, Rubs or new Murmurs, No Parasternal Heave +ve B.Sounds, Abd Soft, No tenderness, No organomegaly appriciated, No rebound - guarding or rigidity. No Cyanosis, Bilat AKA   Data Review:    CBC Recent Labs  Lab 08/16/19 0903 08/16/19 2258 08/17/19 0407  WBC 5.9 5.4 5.8  HGB 16.1 15.6 15.0  HCT 51.0 48.4 45.3  PLT 129* 128* PLATELET CLUMPS NOTED ON SMEAR, UNABLE TO ESTIMATE  MCV 98.1  96.8 95.2  MCH 31.0 31.2 31.5  MCHC 31.6 32.2 33.1  RDW 16.5* 16.2* 16.1*    Chemistries  Recent Labs  Lab 08/16/19 0903 08/16/19 1855 08/16/19 2258 08/17/19 0828  NA 136  --   --  133*  K 4.7  --   --  5.1  CL 90*  --   --  91*  CO2 32  --   --  27  GLUCOSE 227*  --   --  126*  BUN 29*  --   --  42*  CREATININE 9.84*  --  11.38* 11.86*  CALCIUM 9.1  --   --  8.7*  AST  --   --   --  25  ALT  --   --   --  27  ALKPHOS  --   --   --  77  BILITOT  --   --   --  1.1  TSH  --   --  5.715*  --   HGBA1C  --  10.7*  --   --      ------------------------------------------------------------------------------------------------------------------ No results for input(s): CHOL, HDL, LDLCALC, TRIG, CHOLHDL, LDLDIRECT in the last 72 hours.  Lab Results  Component Value Date   HGBA1C 10.7 (H) 08/16/2019   ------------------------------------------------------------------------------------------------------------------ Recent Labs    08/16/19 2258  TSH 5.715*    Cardiac Enzymes No results for input(s): CKMB, TROPONINI, MYOGLOBIN in the last 168 hours.  Invalid input(s): CK ------------------------------------------------------------------------------------------------------------------    Component Value Date/Time   BNP 1,239.1 (H) 08/16/2019 1855    Micro Results Recent Results (from the past 240 hour(s))  SARS Coronavirus 2 by RT PCR (hospital order, performed in [facility] hospital lab) Nasopharyngeal Nasopharyngeal Swab     Status: None   Collection Time: 08/16/19 11:44 AM   Specimen: Nasopharyngeal Swab  Result Value Ref Range Status   SARS Coronavirus 2 NEGATIVE NEGATIVE Final    Comment: (NOTE) SARS-CoV-2 target nucleic acids are NOT DETECTED. The SARS-CoV-2 RNA is generally detectable in upper and lower respiratory specimens during the acute phase of infection. The lowest concentration of SARS-CoV-2 viral copies this assay can detect is 250 copies / mL. A  negative result does not preclude SARS-CoV-2 infection and should not be used as the sole basis for treatment or other patient management decisions.  A negative result may occur with improper specimen collection / handling, submission of specimen other than nasopharyngeal swab, presence of viral mutation(s) within the areas targeted by this assay, and inadequate number of viral copies (<250 copies / mL). A negative result must be combined with clinical observations, patient history, and epidemiological information. Fact Sheet for Patients:   StrictlyIdeas.no Fact Sheet for  Healthcare Providers: BankingDealers.co.za This test is not yet approved or cleared  by the [place] and has been authorized for detection and/or diagnosis of SARS-CoV-2 by FDA under an Emergency Use Authorization (EUA).  This EUA will remain in effect (meaning this test can be used) for the duration of the COVID-19 declaration under Section 564(b)(1) of the Act, 21 U.S.C. section 360bbb-3(b)(1), unless the authorization is terminated or revoked sooner. Performed at [facility], [place] [address], [place], [place] [address]     Radiology Reports CT Head Wo Contrast  Result Date: 08/16/2019 CLINICAL DATA:  Syncope at dialysis yesterday, cough EXAM: CT HEAD WITHOUT CONTRAST TECHNIQUE: Contiguous axial images were obtained from the base of the skull through the vertex without intravenous contrast. COMPARISON:  08/15/2018 FINDINGS: Brain: Chronic ischemic changes are seen within the right cerebellar hemisphere and left occipital lobe, with encephalomalacia and ex vacuo dilatation of the occipital horn of the left lateral ventricle. Small chronic right parietal cortical infarct is again noted and stable. No sign of acute infarct or hemorrhage. The lateral ventricles and midline structures are stable. No acute extra-axial fluid collections. No mass effect. Vascular: No  hyperdense vessel or unexpected calcification. Skull: Normal. Negative for fracture or focal lesion. Sinuses/Orbits: No acute finding. Other: None. IMPRESSION: 1. Stable chronic ischemic changes involving the right cerebellar hemisphere, left occipital lobe, and right parietal cortex. 2. No acute infarct or hemorrhage. Electronically Signed   By: Randa Ngo M.D.   On: 08/16/2019 16:52   CT Angio Chest PE W and/or Wo Contrast  Result Date: 08/16/2019 CLINICAL DATA:  Syncope yesterday at dialysis, hypoxia, cough, worsening shortness of breath EXAM: CT ANGIOGRAPHY CHEST WITH CONTRAST TECHNIQUE: Multidetector CT imaging of the chest was performed using the standard protocol during bolus administration of intravenous contrast. Multiplanar CT image reconstructions and MIPs were obtained to evaluate the vascular anatomy. CONTRAST:  161mL OMNIPAQUE IOHEXOL 350 MG/ML SOLN COMPARISON:  08/16/2019 FINDINGS: Cardiovascular: This is a technically adequate evaluation of the pulmonary vasculature. No filling defects or pulmonary emboli. The heart is enlarged without pericardial effusion. Normal caliber of the thoracic aorta. There is extensive atherosclerosis within the aortic arch and coronary vasculature. Stable stents within the left subclavian and axillary veins. 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 Mediastinum/Nodes: Lymphadenopathy involving the subcarinal and right paratracheal regions is stable. Trachea and esophagus are unremarkable. The thyroid is normal. Lungs/Pleura: There is bilateral ground-glass airspace disease, with denser consolidation in the right lower lobe. No effusion or pneumothorax. Central airways are patent. Upper Abdomen: Reflux of contrast into the hepatic veins may suggest cardiac dysfunction. No other upper abdominal findings. 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 No acute or destructive bony abnormalities. There are  numerous venous collaterals within the right chest wall consistent with chronic right brachiocephalic venous occlusion. Reconstructed images demonstrate no additional findings. Review of the MIP images confirms the above findings. IMPRESSION: 1. No evidence of pulmonary embolus. 2. Bilateral ground-glass airspace disease, with denser consolidation in the right lower lobe. I would favor congestive heart failure given the corresponding findings of cardiomegaly and reflux of contrast into the hepatic veins. 3. Stable nonspecific mediastinal lymphadenopathy. 4. 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 5.  Aortic Atherosclerosis (ICD10-I70.0). Electronically Signed   By: Randa Ngo M.D.   On: 08/16/2019 16:57   DG Chest Port 1 View  Result Date: 08/16/2019 CLINICAL DATA:  Cough. EXAM: PORTABLE CHEST 1 VIEW  COMPARISON:  09/09/2018 and 08/23/2018 FINDINGS: Lungs are hypoinflated and demonstrate hazy opacification over the right mid to lower lung which is not significantly changed compared to the previous exams and may be due to mild chronic prominence of the bronchovascular markings are mild interstitial disease. No acute lobar consolidation or effusion. Stable cardiomegaly. Left subclavian stent unchanged. Remainder of the exam is unchanged. IMPRESSION: 1. No acute findings. Chronic hazy opacification over the right mid to lower. 2.  Cardiomegaly. Electronically Signed   By: Marin Olp M.D.   On: 08/16/2019 11:17   EEG adult  Result Date: 08/17/2019 Lora Havens, MD     08/17/2019  8:58 AM Patient Name: Tjay Velazquez MRN: [id number] Epilepsy Attending: Lora Havens Referring Physician/Provider: Dr. Karena Addison Aroor Date: 08/16/2019 Duration: 21.08 minutes Patient history: 54 year old male with staring episodes concerning for seizures.  Also had generalized tonic-clonic seizure-like episode.  EEG to evaluate for seizures. Level of alertness: Awake AEDs  during EEG study: Keppra Technical aspects: This EEG study was done with scalp electrodes positioned according to the 10-20 International system of electrode placement. Electrical activity was acquired at a sampling rate of 500Hz  and reviewed with a high frequency filter of 70Hz  and a low frequency filter of 1Hz . EEG data were recorded continuously and digitally stored. Description: During awake state, no clear posterior dominant rhythm was seen.  EEG showed continuous generalized polymorphic mixed frequencies with predominantly 6 to 8 Hz theta-alpha activity as well as intermittent 2 to 3 Hz delta activity.  Hyperventilation and photic stimulation were not performed.   ABNORMALITY -Continuous slow, generalized IMPRESSION: This study is suggestive of mild diffuse encephalopathy, nonspecific etiology. No seizures or epileptiform discharges were seen throughout the recording. Priyanka Barbra Sarks   Overnight EEG with video  Result Date: 08/17/2019 Lora Havens, MD     08/17/2019  9:11 AM Patient Name: Xaivier Malay MRN: [id number] Epilepsy Attending: Lora Havens Referring Physician/Provider: Dr. Karena Addison Aroor Duration: 08/16/2019 1701 to 08/17/2019 0900  Patient history: 54 year old male with staring episodes concerning for seizures.  Also had generalized tonic-clonic seizure-like episode.  EEG to evaluate for seizures.  Level of alertness: Awake, asleep  AEDs during EEG study: Keppra  Technical aspects: This EEG study was done with scalp electrodes positioned according to the 10-20 International system of electrode placement. Electrical activity was acquired at a sampling rate of 500Hz  and reviewed with a high frequency filter of 70Hz  and a low frequency filter of 1Hz . EEG data were recorded continuously and digitally stored.  Description: The posterior dominant rhythm consists of 9-10 Hz activity of moderate voltage (25-35 uV) seen predominantly in posterior head regions, symmetric and reactive to  eye opening and eye closing. Sleep was characterized by vertex waves, sleep spindles (12 to 14 Hz), maximal frontocentral region and posterior occipital sharp transients of sleep.  Hyperventilation and photic stimulation were not performed.    IMPRESSION: This study is within normal limits. No seizures or epileptiform discharges were seen throughout the recording. Priyanka Barbra Sarks

## 2019-08-17 NOTE — Progress Notes (Signed)
LTM maintenance completed; reglued Fp1, Fp2, O1, O2, P3, Pz, P4, M1, M2, F7, T7, T8, and P8. No skin breakdown was seen. Rebundled all leads.

## 2019-08-17 NOTE — Procedures (Addendum)
Patient Name: Howard Bell  MRN: [id number]  Epilepsy Attending: Lora Havens  Referring Physician/Provider: Dr. Karena Addison Aroor Duration: 08/16/2019 1701 to 08/17/2019 1701  Patient history: 54 year old male with staring episodes concerning for seizures.  Also had generalized tonic-clonic seizure-like episode.  EEG to evaluate for seizures.  Level of alertness: Awake, asleep  AEDs during EEG study: Keppra  Technical aspects: This EEG study was done with scalp electrodes positioned according to the 10-20 International system of electrode placement. Electrical activity was acquired at a sampling rate of 500Hz  and reviewed with a high frequency filter of 70Hz  and a low frequency filter of 1Hz . EEG data were recorded continuously and digitally stored.   Description: The posterior dominant rhythm consists of 9-10 Hz activity of moderate voltage (25-35 uV) seen predominantly in posterior head regions, symmetric and reactive to eye opening and eye closing. Sleep was characterized by vertex waves, sleep spindles (12 to 14 Hz), maximal frontocentral region and posterior occipital sharp transients of sleep.  Hyperventilation and photic stimulation were not performed.     1 event was recorded at 1:32 on 08/17/2019.  Patient was laying in bed watching TV.  When nurse walked in and asked patient a question, he initially did not respond and was noted to have brief nonrhythmic jerking movement of bilateral upper extremity.  He eventually responded to the nurses question and asked if this event is being captured.  Concomitant EEG review before, during and after the event did not show any EEG change to suggest seizure.  IMPRESSION: This study is within normal limits. No seizures or epileptiform discharges were seen throughout the recording.  One episode of decreased responsiveness was recorded as described above around 1:32 AM on 08/17/2019 without concomitant EEG change and was a nonepileptic  event.   Mariafernanda Hendricksen Barbra Sarks

## 2019-08-17 NOTE — Progress Notes (Signed)
Reason for consult:   Subjective: Patient somnolent this morning.  Apparently somewhere between midnight and 2 AM patient had episode of hallucination and also had 1 event of nonrhythmic jerking movement of bilateral upper extremities.Marland Kitchen  Has been on long-term EEG, no pushbutton events.   ROS: negative except above  Examination  Vital signs in last 24 hours: Temp:  [97.6 F (36.4 C)-98.5 F (36.9 C)] 98.2 F (36.8 C) (05/28 1530) Pulse Rate:  [78-88] 78 (05/28 1530) Resp:  [18-20] 18 (05/28 1530) BP: (98-144)/(66-120) 134/81 (05/28 1530) SpO2:  [94 %-99 %] 96 % (05/28 1530) Weight:  [92.5 kg] 92.5 kg (05/28 0600)  General: lying in bed CVS: pulse-normal rate and rhythm RS: breathing comfortably Extremities: normal   Neuro: MS: Alert, oriented, follows commands CN: pupils equal and reactive,  EOMI, face symmetric, tongue midline, normal sensation over face, Motor: 5/5 strength in upper extremities Coordination: normal Gait: not tested  Basic Metabolic Panel: Recent Labs  Lab 08/16/19 0903 08/16/19 2258 08/17/19 0828 08/17/19 1012  NA 136  --  133* 134*  K 4.7  --  5.1 5.3*  CL 90*  --  91* 91*  CO2 32  --  27 30  GLUCOSE 227*  --  126* 143*  BUN 29*  --  42* 41*  CREATININE 9.84* 11.38* 11.86* 12.04*  CALCIUM 9.1  --  8.7* 8.7*  PHOS  --   --   --  8.4*    CBC: Recent Labs  Lab 08/16/19 0903 08/16/19 2258 08/17/19 0407  WBC 5.9 5.4 5.8  HGB 16.1 15.6 15.0  HCT 51.0 48.4 45.3  MCV 98.1 96.8 95.2  PLT 129* 128* PLATELET CLUMPS NOTED ON SMEAR, UNABLE TO ESTIMATE     Coagulation Studies: No results for input(s): LABPROT, INR in the last 72 hours.  Imaging Reviewed:     ASSESSMENT AND PLAN  54 year old male with ESRD on dialysis, obstructive sleep apnea, type 2 diabetes mellitus, bilateral amputation due to severe PVD, hypertension with past history of seizures no longer on Dilantin presents to emergency department with 3 episodes of altered  awareness concerning for seizures.  Patient was placed on overnight EEG-no seizures or epileptiform discharges throughout the recording.  Pushbutton was not pressed at 1:32 AM -this is nonepileptic.  Patient remains drowsy this morning, possibly due to prolonged postictal period versus sedation from [place].  Recommendations Continue EEG for 24 more hours MRI brain without contrast after EEG Continue Keppra 500 mg nightly Seizure precautions   Karena Addison Mariska Daffin [facility] Pager Number [phone] For questions after 7pm please refer to AMION to reach the Neurologist on call

## 2019-08-17 NOTE — Procedures (Addendum)
Patient Name: Howard Bell  MRN: [id number]  Epilepsy Attending: Lora Havens  Referring Physician/Provider: Dr. Karena Addison Aroor Date: 08/16/2019 Duration: 21.08 minutes  Patient history: 54 year old male with staring episodes concerning for seizures.  Also had generalized tonic-clonic seizure-like episode.  EEG to evaluate for seizures.  Level of alertness: Awake  AEDs during EEG study: Keppra  Technical aspects: This EEG study was done with scalp electrodes positioned according to the 10-20 International system of electrode placement. Electrical activity was acquired at a sampling rate of 500Hz  and reviewed with a high frequency filter of 70Hz  and a low frequency filter of 1Hz . EEG data were recorded continuously and digitally stored.   Description: During awake state, no clear posterior dominant rhythm was seen.  EEG showed continuous generalized polymorphic mixed frequencies with predominantly 6 to 8 Hz theta-alpha activity as well as intermittent 2 to 3 Hz delta activity.  Hyperventilation and photic stimulation were not performed.     ABNORMALITY -Continuous slow, generalized  IMPRESSION: This study is suggestive of mild diffuse encephalopathy, nonspecific etiology. No seizures or epileptiform discharges were seen throughout the recording.  Lorrena Goranson Barbra Sarks

## 2019-08-17 NOTE — Procedures (Signed)
Echo attempted. EEG in progress. Will attempt again later.

## 2019-08-18 ENCOUNTER — Inpatient Hospital Stay (HOSPITAL_COMMUNITY): Payer: Medicare Other

## 2019-08-18 ENCOUNTER — Other Ambulatory Visit: Payer: Self-pay

## 2019-08-18 DIAGNOSIS — R569 Unspecified convulsions: Secondary | ICD-10-CM

## 2019-08-18 DIAGNOSIS — I5043 Acute on chronic combined systolic (congestive) and diastolic (congestive) heart failure: Secondary | ICD-10-CM

## 2019-08-18 DIAGNOSIS — N186 End stage renal disease: Secondary | ICD-10-CM

## 2019-08-18 DIAGNOSIS — Z992 Dependence on renal dialysis: Secondary | ICD-10-CM

## 2019-08-18 LAB — CBC
HCT: 45.7 % (ref 39.0–52.0)
Hemoglobin: 15.1 g/dL (ref 13.0–17.0)
MCH: 30.9 pg (ref 26.0–34.0)
MCHC: 33 g/dL (ref 30.0–36.0)
MCV: 93.6 fL (ref 80.0–100.0)
Platelets: 151 10*3/uL (ref 150–400)
RBC: 4.88 MIL/uL (ref 4.22–5.81)
RDW: 15.8 % — ABNORMAL HIGH (ref 11.5–15.5)
WBC: 6.7 10*3/uL (ref 4.0–10.5)
nRBC: 0 % (ref 0.0–0.2)

## 2019-08-18 LAB — RENAL FUNCTION PANEL
Albumin: 3.5 g/dL (ref 3.5–5.0)
Anion gap: 16 — ABNORMAL HIGH (ref 5–15)
BUN: 32 mg/dL — ABNORMAL HIGH (ref 6–20)
CO2: 27 mmol/L (ref 22–32)
Calcium: 9.1 mg/dL (ref 8.9–10.3)
Chloride: 91 mmol/L — ABNORMAL LOW (ref 98–111)
Creatinine, Ser: 8.89 mg/dL — ABNORMAL HIGH (ref 0.61–1.24)
GFR calc Af Amer: 7 mL/min — ABNORMAL LOW (ref >60)
GFR calc non Af Amer: 6 mL/min — ABNORMAL LOW (ref >60)
Glucose, Bld: 157 mg/dL — ABNORMAL HIGH (ref 70–99)
Phosphorus: 4.9 mg/dL — ABNORMAL HIGH (ref 2.5–4.6)
Potassium: 3.6 mmol/L (ref 3.5–5.1)
Sodium: 134 mmol/L — ABNORMAL LOW (ref 135–145)

## 2019-08-18 LAB — GLUCOSE, CAPILLARY
Glucose-Capillary: 173 mg/dL — ABNORMAL HIGH (ref 70–99)
Glucose-Capillary: 192 mg/dL — ABNORMAL HIGH (ref 70–99)
Glucose-Capillary: 171 mg/dL — ABNORMAL HIGH (ref 70–99)
Glucose-Capillary: 210 mg/dL — ABNORMAL HIGH (ref 70–99)

## 2019-08-18 MED ORDER — LIDOCAINE HCL (PF) 1 % IJ SOLN: 5.0000 mL | INTRAMUSCULAR | Status: DC | PRN

## 2019-08-18 MED ORDER — LIDOCAINE-PRILOCAINE 2.5-2.5 % EX CREA: 1.0000 "application " | TOPICAL_CREAM | CUTANEOUS | Status: DC | PRN

## 2019-08-18 MED ORDER — PENTAFLUOROPROP-TETRAFLUOROETH EX AERO: 1.0000 "application " | INHALATION_SPRAY | CUTANEOUS | Status: DC | PRN

## 2019-08-18 MED ORDER — ALTEPLASE 2 MG IJ SOLR: 2.0000 mg | Freq: Once | INTRAMUSCULAR | Status: DC | PRN

## 2019-08-18 MED ORDER — CARVEDILOL 3.125 MG PO TABS
3.1250 mg | ORAL_TABLET | Freq: Two times a day (BID) | ORAL | Status: DC
  Administered 2019-08-18 – 2019-08-19 (×2): 3.125 mg via ORAL
  Filled 2019-08-18 (×2): qty 1

## 2019-08-18 MED ORDER — DOXERCALCIFEROL 4 MCG/2ML IV SOLN
INTRAVENOUS | Status: AC
  Administered 2019-08-18: 4 ug via INTRAVENOUS
  Filled 2019-08-18: qty 2

## 2019-08-18 MED ORDER — SODIUM CHLORIDE 0.9 % IV SOLN: 100.0000 mL | INTRAVENOUS | Status: DC | PRN

## 2019-08-18 MED ORDER — HEPARIN SODIUM (PORCINE) 1000 UNIT/ML DIALYSIS
6000.0000 [IU] | Freq: Once | INTRAMUSCULAR | Status: DC
  Filled 2019-08-18: qty 6

## 2019-08-18 MED ORDER — HEPARIN SODIUM (PORCINE) 1000 UNIT/ML DIALYSIS: 1000.0000 [IU] | INTRAMUSCULAR | Status: DC | PRN

## 2019-08-18 MED ORDER — ISOSORB DINITRATE-HYDRALAZINE 20-37.5 MG PO TABS
1.0000 | ORAL_TABLET | Freq: Three times a day (TID) | ORAL | Status: DC
  Administered 2019-08-18 – 2019-08-19 (×3): 1 via ORAL
  Filled 2019-08-18 (×4): qty 1

## 2019-08-18 NOTE — Progress Notes (Signed)
PROGRESS NOTE                                                                                                                                                                                                             Patient Demographics:    Howard Bell, is a 54 y.o. male, DOB - [birth year], BOF:[id number]  Outpatient Primary MD for the patient is Horald Pollen, MD    LOS - 2  Admit date - 08/16/2019    Chief Complaint  Patient presents with  . Loss of Consciousness       Brief Narrative  Howard Bell  is a 54 y.o. 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   Subjective:   Patient in bed, appears comfortable, denies any headache, no fever, no chest pain or pressure, no shortness of breath , no abdominal pain. No focal weakness.   Assessment  & Plan :     1.  AMS ongoing with Multiple seizure-like episodes.  In a patient with history of seizures who was on Dilantin previously which was stopped several years ago - CTA head nonacute with no stroke, prolonged EEG did not pick up any seizure-like activity, however he has shown clinical improvement on IV Keppra which will be continued with as needed IV Ativan for breakthrough activity if noted, mentation has improved on 08/18/2019 will monitor another 24 hours inpatient.  2.  ESRD.  On MWF schedule, finished dialysis yesterday confirmed by patient and wife, nephrology has been  consulted .  3.  Question of patient being slightly hypoxic upon arrival .    Likely false reading due to poor circulation in extremities, comfortable on 2 L, no acute issues we will continue to monitor.    4.    Combined chronic systolic and diastolic heart failure, EF was 65% 1 year ago and now 25%.    Currently appears close to compensated,  any extra fluid being removed via dialysis, blood pressure has stabilized will put him on combination of Coreg, hydralazine and Imdur, potassium runs quite high hence he is a poor candidate for ACE/ARB or Entresto.  5.  Hypertension.    Blood pressure stabilized see #4 above.  6.  History of bilateral BKA.  Supportive care.  7.   Obesity with OSA.  Follow with PCP for weight loss, CPAP nightly which he started using only 3 to 4 days prior to hospital admission.    8. DM type II.  Check A1c placed him on every 6 sliding scale for now.  Lab Results  Component Value Date   HGBA1C 10.7 (H) 08/16/2019   CBG (last 3)  Recent Labs    08/17/19 1849 08/17/19 2313 08/18/19 0603  GLUCAP 181* 165* 173*       Condition - Fair  Family Communication  :  Wife in detail 08/16/19, 08/17/2019, 08/18/2019  Code Status :  Full  Consults  :  Renal, Neuro  Procedures  :    CTA Head - Non acute  CT Chest - possible CHF but no PE.,  Chronic occlusion of right brachiocephalic vein.  PUD Prophylaxis : PPI  Disposition Plan  :    Status is: Inpatient  Remains inpatient appropriate because:Altered mental status   Dispo: The patient is from: Home              Anticipated d/c is to: Home              Anticipated d/c date is: 3 days              Patient currently is not medically stable to d/c. Continued AMS   DVT Prophylaxis  :   Heparin   Lab Results  Component Value Date   PLT PLATELET CLUMPS NOTED ON SMEAR, UNABLE TO ESTIMATE 08/17/2019    Diet :  Diet Order            Diet renal/carb modified with fluid restriction Diet-HS Snack?  Nothing; Fluid restriction: 1200 mL Fluid; Room service appropriate? Yes; Fluid consistency: Thin  Diet effective now               Inpatient Medications  Scheduled Meds: . Chlorhexidine Gluconate Cloth  6 each Topical Q0600  . cinacalcet  60 mg Oral Q M,W,F-HD  . docusate sodium  100 mg Oral BID  . doxercalciferol  4 mcg Intravenous Q M,W,F-HD  . heparin  5,000 Units Subcutaneous Q8H  . insulin aspart  0-9 Units Subcutaneous Q6H  . latanoprost  1 drop Both Eyes QHS  . levETIRAcetam  500 mg Oral QHS  . metoCLOPramide  5 mg Oral BID AC  . multivitamin  1 tablet Oral QHS  . pantoprazole  40 mg Oral Daily  . sevelamer carbonate  2,400 mg Oral TID WC  . sodium chloride flush  3 mL Intravenous Q12H  . timolol  1 drop Both Eyes BID   Continuous Infusions: PRN Meds:.acetaminophen **OR** acetaminophen, bisacodyl, hydrALAZINE, LORazepam, ondansetron **OR** ondansetron (ZOFRAN) IV  Antibiotics  :    Anti-infectives (From admission, onward)   None       Time Spent in minutes  30   Lala Lund M.D on 08/18/2019 at 9:04 AM  To page go to [email or web address] - password [facility]  [facility] -  Office  [phone]   See all Orders from today for further details    Objective:   Vitals:   08/18/19  0400 08/18/19 0500 08/18/19 0600 08/18/19 0753  BP: 126/62   (!) 155/86  Pulse: 85 87  86  Resp: 18 18 19 18   Temp: 98.3 F (36.8 C)   98.2 F (36.8 C)  TempSrc: Oral   Oral  SpO2: 96% 98%    Weight:      Height:        Wt Readings from Last 3 Encounters:  08/17/19 92.5 kg  06/12/19 99.8 kg  04/17/19 91.6 kg     Intake/Output Summary (Last 24 hours) at 08/18/2019 0904 Last data filed at 08/18/2019 0847 Gross per 24 hour  Intake 670 ml  Output 0 ml  Net 670 ml     Physical Exam  Awake Alert, No new F.N deficits, Normal affect [place].AT,PERRAL Supple Neck,No JVD, No cervical lymphadenopathy appriciated.  Symmetrical Chest wall movement, Good air movement  bilaterally, CTAB RRR,No Gallops, Rubs or new Murmurs, No Parasternal Heave +ve B.Sounds, Abd Soft, No tenderness, No organomegaly appriciated, No rebound - guarding or rigidity. No Cyanosis,  Bilat AKA   Data Review:    CBC Recent Labs  Lab 08/16/19 0903 08/16/19 2258 08/17/19 0407  WBC 5.9 5.4 5.8  HGB 16.1 15.6 15.0  HCT 51.0 48.4 45.3  PLT 129* 128* PLATELET CLUMPS NOTED ON SMEAR, UNABLE TO ESTIMATE  MCV 98.1 96.8 95.2  MCH 31.0 31.2 31.5  MCHC 31.6 32.2 33.1  RDW 16.5* 16.2* 16.1*    Chemistries  Recent Labs  Lab 08/16/19 0903 08/16/19 1855 08/16/19 2258 08/17/19 0828 08/17/19 1012  NA 136  --   --  133* 134*  K 4.7  --   --  5.1 5.3*  CL 90*  --   --  91* 91*  CO2 32  --   --  27 30  GLUCOSE 227*  --   --  126* 143*  BUN 29*  --   --  42* 41*  CREATININE 9.84*  --  11.38* 11.86* 12.04*  CALCIUM 9.1  --   --  8.7* 8.7*  AST  --   --   --  25  --   ALT  --   --   --  27  --   ALKPHOS  --   --   --  77  --   BILITOT  --   --   --  1.1  --   TSH  --   --  5.715*  --   --   HGBA1C  --  10.7*  --   --   --      ------------------------------------------------------------------------------------------------------------------ No results for input(s): CHOL, HDL, LDLCALC, TRIG, CHOLHDL, LDLDIRECT in the last 72 hours.  Lab Results  Component Value Date   HGBA1C 10.7 (H) 08/16/2019   ------------------------------------------------------------------------------------------------------------------ Recent Labs    08/16/19 2258  TSH 5.715*    Cardiac Enzymes No results for input(s): CKMB, TROPONINI, MYOGLOBIN in the last 168 hours.  Invalid input(s): CK ------------------------------------------------------------------------------------------------------------------    Component Value Date/Time   BNP 1,239.1 (H) 08/16/2019 1855    Micro Results Recent Results (from the past 240 hour(s))  SARS Coronavirus 2 by RT PCR (hospital order, performed in [facility] hospital lab) Nasopharyngeal Nasopharyngeal Swab     Status: None   Collection Time: 08/16/19 11:44 AM   Specimen: Nasopharyngeal Swab  Result Value Ref Range Status   SARS Coronavirus 2 NEGATIVE NEGATIVE Final    Comment: (NOTE) SARS-CoV-2 target nucleic acids are NOT DETECTED. The SARS-CoV-2 RNA is  generally detectable in upper and lower respiratory specimens during the acute phase of infection. The lowest concentration of SARS-CoV-2 viral copies this assay can detect is 250 copies / mL. A negative result does not preclude SARS-CoV-2 infection and should not be used as the sole basis for treatment or other patient management decisions.  A negative result may occur with improper specimen collection / handling, submission of specimen other than nasopharyngeal swab, presence of viral mutation(s) within the areas targeted by this assay, and inadequate number of viral copies (<250 copies / mL). A negative result must be combined with clinical observations, patient history, and epidemiological information. Fact Sheet for Patients:   StrictlyIdeas.no Fact Sheet for Healthcare Providers: BankingDealers.co.za This test is not yet approved or cleared  by the [place] FDA and has been authorized for detection and/or diagnosis of SARS-CoV-2 by FDA under an Emergency Use Authorization (EUA).  This EUA will remain in effect (meaning this test can be used) for the duration of the COVID-19 declaration under Section 564(b)(1) of the Act, 21 U.S.C. section 360bbb-3(b)(1), unless the authorization is terminated or revoked sooner. Performed at [facility], [place] [address], [place], [place] [address]   MRSA PCR Screening     Status: None   Collection Time: 08/17/19  3:08 PM   Specimen: Nasal Mucosa; Nasopharyngeal  Result Value Ref Range Status   MRSA by PCR NEGATIVE NEGATIVE Final    Comment:        The GeneXpert MRSA Assay  (FDA approved for NASAL specimens only), is one component of a comprehensive MRSA colonization surveillance program. It is not intended to diagnose MRSA infection nor to guide or monitor treatment for MRSA infections. Performed at [facility], [place] [address], [place], [place] [address]     Radiology Reports CT Head Wo Contrast  Result Date: 08/16/2019 CLINICAL DATA:  Syncope at dialysis yesterday, cough EXAM: CT HEAD WITHOUT CONTRAST TECHNIQUE: Contiguous axial images were obtained from the base of the skull through the vertex without intravenous contrast. COMPARISON:  08/15/2018 FINDINGS: Brain: Chronic ischemic changes are seen within the right cerebellar hemisphere and left occipital lobe, with encephalomalacia and ex vacuo dilatation of the occipital horn of the left lateral ventricle. Small chronic right parietal cortical infarct is again noted and stable. No sign of acute infarct or hemorrhage. The lateral ventricles and midline structures are stable. No acute extra-axial fluid collections. No mass effect. Vascular: No hyperdense vessel or unexpected calcification. Skull: Normal. Negative for fracture or focal lesion. Sinuses/Orbits: No acute finding. Other: None. IMPRESSION: 1. Stable chronic ischemic changes involving the right cerebellar hemisphere, left occipital lobe, and right parietal cortex. 2. No acute infarct or hemorrhage. Electronically Signed   By: Randa Ngo M.D.   On: 08/16/2019 16:52   CT Angio Chest PE W and/or Wo Contrast  Result Date: 08/16/2019 CLINICAL DATA:  Syncope yesterday at dialysis, hypoxia, cough, worsening shortness of breath EXAM: CT ANGIOGRAPHY CHEST WITH CONTRAST TECHNIQUE: Multidetector CT imaging of the chest was performed using the standard protocol during bolus administration of intravenous contrast. Multiplanar CT image reconstructions and MIPs were obtained to evaluate the vascular anatomy. CONTRAST:  169mL OMNIPAQUE IOHEXOL 350 MG/ML SOLN  COMPARISON:  08/16/2019 FINDINGS: Cardiovascular: This is a technically adequate evaluation of the pulmonary vasculature. No filling defects or pulmonary emboli. The heart is enlarged without pericardial effusion. Normal caliber of the thoracic aorta. There is extensive atherosclerosis within the aortic arch and coronary vasculature. Stable stents within the  left subclavian and axillary veins. 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 Mediastinum/Nodes: Lymphadenopathy involving the subcarinal and right paratracheal regions is stable. Trachea and esophagus are unremarkable. The thyroid is normal. Lungs/Pleura: There is bilateral ground-glass airspace disease, with denser consolidation in the right lower lobe. No effusion or pneumothorax. Central airways are patent. Upper Abdomen: Reflux of contrast into the hepatic veins may suggest cardiac dysfunction. No other upper abdominal findings. 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 No acute or destructive bony abnormalities. There are numerous venous collaterals within the right chest wall consistent with chronic right brachiocephalic venous occlusion. Reconstructed images demonstrate no additional findings. Review of the MIP images confirms the above findings. IMPRESSION: 1. No evidence of pulmonary embolus. 2. Bilateral ground-glass airspace disease, with denser consolidation in the right lower lobe. I would favor congestive heart failure given the corresponding findings of cardiomegaly and reflux of contrast into the hepatic veins. 3. Stable nonspecific mediastinal lymphadenopathy. 4. 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 5.  Aortic Atherosclerosis (ICD10-I70.0). Electronically Signed   By: Randa Ngo M.D.   On: 08/16/2019 16:57   DG Chest Port 1 View  Result Date: 08/16/2019 CLINICAL DATA:  Cough. EXAM: PORTABLE CHEST 1 VIEW COMPARISON:   09/09/2018 and 08/23/2018 FINDINGS: Lungs are hypoinflated and demonstrate hazy opacification over the right mid to lower lung which is not significantly changed compared to the previous exams and may be due to mild chronic prominence of the bronchovascular markings are mild interstitial disease. No acute lobar consolidation or effusion. Stable cardiomegaly. Left subclavian stent unchanged. Remainder of the exam is unchanged. IMPRESSION: 1. No acute findings. Chronic hazy opacification over the right mid to lower. 2.  Cardiomegaly. Electronically Signed   By: Marin Olp M.D.   On: 08/16/2019 11:17   EEG adult  Result Date: 08/17/2019 Lora Havens, MD     08/17/2019  8:58 AM Patient Name: Howard Bell MRN: [id number] Epilepsy Attending: Lora Havens Referring Physician/Provider: Dr. Karena Addison Aroor Date: 08/16/2019 Duration: 21.08 minutes Patient history: 54 year old male with staring episodes concerning for seizures.  Also had generalized tonic-clonic seizure-like episode.  EEG to evaluate for seizures. Level of alertness: Awake AEDs during EEG study: Keppra Technical aspects: This EEG study was done with scalp electrodes positioned according to the 10-20 International system of electrode placement. Electrical activity was acquired at a sampling rate of 500Hz  and reviewed with a high frequency filter of 70Hz  and a low frequency filter of 1Hz . EEG data were recorded continuously and digitally stored. Description: During awake state, no clear posterior dominant rhythm was seen.  EEG showed continuous generalized polymorphic mixed frequencies with predominantly 6 to 8 Hz theta-alpha activity as well as intermittent 2 to 3 Hz delta activity.  Hyperventilation and photic stimulation were not performed.   ABNORMALITY -Continuous slow, generalized IMPRESSION: This study is suggestive of mild diffuse encephalopathy, nonspecific etiology. No seizures or epileptiform discharges were seen throughout the  recording. Priyanka Barbra Sarks   Overnight EEG with video  Result Date: 08/17/2019 Lora Havens, MD     08/18/2019  8:31 AM Patient Name: Howard Bell MRN: [id number] Epilepsy Attending: Lora Havens Referring Physician/Provider: Dr. Karena Addison Aroor Duration: 08/16/2019 1701 to 08/17/2019 1701  Patient history: 54 year old male with staring episodes concerning for seizures.  Also had generalized tonic-clonic seizure-like episode.  EEG to evaluate for seizures.  Level of alertness: Awake, asleep  AEDs during EEG study: Keppra  Technical aspects:  This EEG study was done with scalp electrodes positioned according to the 10-20 International system of electrode placement. Electrical activity was acquired at a sampling rate of 500Hz  and reviewed with a high frequency filter of 70Hz  and a low frequency filter of 1Hz . EEG data were recorded continuously and digitally stored.  Description: The posterior dominant rhythm consists of 9-10 Hz activity of moderate voltage (25-35 uV) seen predominantly in posterior head regions, symmetric and reactive to eye opening and eye closing. Sleep was characterized by vertex waves, sleep spindles (12 to 14 Hz), maximal frontocentral region and posterior occipital sharp transients of sleep.  Hyperventilation and photic stimulation were not performed.   1 event was recorded at 1:32 on 08/17/2019.  Patient was laying in bed watching TV.  When nurse walked in and asked patient a question, he initially did not respond and was noted to have brief nonrhythmic jerking movement of bilateral upper extremity.  He eventually responded to the nurses question and asked if this event is being captured.  Concomitant EEG review before, during and after the event did not show any EEG change to suggest seizure.  IMPRESSION: This study is within normal limits. No seizures or epileptiform discharges were seen throughout the recording. One episode of decreased responsiveness was recorded as  described above around 1:32 AM on 08/17/2019 without concomitant EEG change and was a nonepileptic event. Lora Havens   ECHOCARDIOGRAM COMPLETE  Result Date: 08/17/2019    ECHOCARDIOGRAM REPORT   Patient Name:   Howard Bell Date of Exam: 08/17/2019 Medical Rec #:  [id number]         Height:       72.0 in Accession #:    [id number]        Weight:       203.9 lb Date of Birth:  Oct 18, 1965         BSA:          2.148 m Patient Age:    63 years          BP:           134/81 mmHg Patient Gender: M                 HR:           80 bpm. Exam Location:  Inpatient Procedure: 2D Echo, Cardiac Doppler, Color Doppler and Intracardiac            Opacification Agent Indications:    CHF-Acute Diastolic  History:        Patient has prior history of Echocardiogram examinations, most                 recent 09/05/2018. Risk Factors:Diabetes, Sleep Apnea and                 Hypertension. ESRD.  Sonographer:    Clayton Lefort RDCS (AE) Referring Phys: [id number] Mackintosh [facility]  Sonographer Comments: Suboptimal subcostal window. Image acquisition challenging due to patient body habitus. IMPRESSIONS  1. Left ventricular ejection fraction, by estimation, is 25 to 30%. The left ventricle has severely decreased function. The left ventricle demonstrates global hypokinesis with regional variation and spares the apex. There is severe left ventricular hypertrophy. Left ventricular diastolic parameters are consistent with Grade I diastolic dysfunction (impaired relaxation). Elevated left ventricular end-diastolic pressure.  2. Right ventricular systolic function is moderately reduced. The right ventricular size is mildly enlarged.  3. Left atrial size was mildly dilated.  4. Right atrial size  was severely dilated.  5. The mitral valve is abnormal. Trivial mitral valve regurgitation.  6. Tricuspid valve regurgitation is mild to moderate.  7. The aortic valve is tricuspid. Aortic valve regurgitation is not visualized. Mild aortic valve sclerosis  is present, with no evidence of aortic valve stenosis.  8. Aortic dilatation noted. There is borderline dilatation of the ascending aorta measuring 39 mm. Comparison(s): Changes from prior study are noted. 09/05/18: 60-65%, mild LVH. Dr. Candiss Norse notified of acute changes. FINDINGS  Left Ventricle: Left ventricular ejection fraction, by estimation, is 25 to 30%. The left ventricle has severely decreased function. The left ventricle demonstrates global hypokinesis. Definity contrast agent was given IV to delineate the left ventricular endocardial borders. The left ventricular internal cavity size was normal in size. There is severe left ventricular hypertrophy. Left ventricular diastolic parameters are consistent with Grade I diastolic dysfunction (impaired relaxation). Elevated left ventricular end-diastolic pressure. Right Ventricle: The right ventricular size is mildly enlarged. No increase in right ventricular wall thickness. Right ventricular systolic function is moderately reduced. Left Atrium: Left atrial size was mildly dilated. Right Atrium: Right atrial size was severely dilated. Pericardium: Trivial pericardial effusion is present. The pericardial effusion is circumferential. Mitral Valve: The mitral valve is abnormal. There is mild thickening of the mitral valve leaflet(s). Mild to moderate mitral annular calcification. Trivial mitral valve regurgitation. MV peak gradient, 3.9 mmHg. The mean mitral valve gradient is 2.0 mmHg. Tricuspid Valve: The tricuspid valve is grossly normal. Tricuspid valve regurgitation is mild to moderate. Aortic Valve: The aortic valve is tricuspid. Aortic valve regurgitation is not visualized. Mild aortic valve sclerosis is present, with no evidence of aortic valve stenosis. Pulmonic Valve: The pulmonic valve was normal in structure. Pulmonic valve regurgitation is trivial. Aorta: Aortic dilatation noted. There is borderline dilatation of the ascending aorta measuring 39 mm. Venous:  The inferior vena cava was not well visualized. IAS/Shunts: There is left bowing of the interatrial septum, suggestive of elevated right atrial pressure. No atrial level shunt detected by color flow Doppler.  LEFT VENTRICLE PLAX 2D LVIDd:         4.64 cm  Diastology LVIDs:         4.04 cm  LV e' lateral:   4.31 cm/s LV PW:         1.80 cm  LV E/e' lateral: 18.6 LV IVS:        1.99 cm  LV e' medial:    2.44 cm/s LVOT diam:     2.20 cm  LV E/e' medial:  32.8 LV SV:         29 LV SV Index:   13 LVOT Area:     3.80 cm  RIGHT VENTRICLE RV Basal diam:  3.92 cm RV Mid diam:    3.71 cm RV S prime:     5.12 cm/s TAPSE (M-mode): 1.1 cm LEFT ATRIUM              Index       RIGHT ATRIUM           Index LA diam:        3.40 cm  1.58 cm/m  RA Area:     29.00 cm LA Vol (A2C):   103.0 ml 47.95 ml/m RA Volume:   105.00 ml 48.88 ml/m LA Vol (A4C):   53.5 ml  24.90 ml/m LA Biplane Vol: 80.5 ml  37.47 ml/m  AORTIC VALVE AV Area (Vmax):    1.92 cm AV Area (Vmean):  1.69 cm AV Area (VTI):     1.62 cm AV Vmax:           96.30 cm/s AV Vmean:          72.600 cm/s AV VTI:            0.178 m AV Peak Grad:      3.7 mmHg AV Mean Grad:      2.0 mmHg LVOT Vmax:         48.70 cm/s LVOT Vmean:        32.200 cm/s LVOT VTI:          0.076 m LVOT/AV VTI ratio: 0.43  AORTA Ao Root diam: 3.70 cm Ao Asc diam:  3.90 cm MITRAL VALVE               TRICUSPID VALVE MV Area (PHT): 4.89 cm    TR Peak grad:   30.5 mmHg MV Peak grad:  3.9 mmHg    TR Vmax:        276.00 cm/s MV Mean grad:  2.0 mmHg MV Vmax:       0.99 m/s    SHUNTS MV Vmean:      57.4 cm/s   Systemic VTI:  0.08 m MV Decel Time: 155 msec    Systemic Diam: 2.20 cm MV E velocity: 80.10 cm/s MV A velocity: 91.30 cm/s MV E/A ratio:  0.88 Lyman Bishop MD Electronically signed by Lyman Bishop MD Signature Date/Time: 08/17/2019/5:52:07 PM    Final

## 2019-08-18 NOTE — Progress Notes (Signed)
LTM EEG discontinued - no skin breakdown at unhook.   

## 2019-08-18 NOTE — Progress Notes (Signed)
Pt unable to wear CPAP at this time d/t EEG in place. Pt respiratory status is stable at this time on [place] 4 LPM. No distress noted. RT will continue to monitor.

## 2019-08-18 NOTE — Progress Notes (Signed)
Patient is not wearing his CPAP machine tonight.

## 2019-08-18 NOTE — Procedures (Addendum)
Patient Name:Howard Bell TFT:[id number] Epilepsy [place] Referring Physician/Provider:Dr. Doristine Devoid Duration:08/17/2019 1701 to 08/18/2019 1041  Patient history:54 year old male with staring episodes concerning for seizures. Also had generalized tonic-clonic seizure-like episode. EEG to evaluate for seizures.  Level of alertness:Awake, asleep  AEDs during EEG study:Keppra  Technical aspects: This EEG study was done with scalp electrodes positioned according to the 10-20 International system of electrode placement. Electrical activity was acquired at a sampling rate of 500Hz  and reviewed with a high frequency filter of 70Hz  and a low frequency filter of 1Hz . EEG data were recorded continuously and digitally stored.   Description:The posterior dominant rhythm consists of 9-10 Hz activity of moderate voltage (25-35 uV) seen predominantly in posterior head regions, symmetric and reactive to eye opening and eye closing. Sleep was characterized by vertex waves, sleep spindles (12 to 14 Hz), maximal frontocentral region and posterior occipital sharp transients of sleep.Hyperventilation and photic stimulation were not performed.   Of note, parts of study were difficult to interpret due to electrode artifact.  IMPRESSION: This study is within normal limits. No seizures or epileptiform discharges were seen throughout the recording.  Olar Santini Barbra Sarks

## 2019-08-18 NOTE — Plan of Care (Signed)
Discussed with patient and wife over the phone plan of care for the evening, pain management, possible discharge tomorrow and request for sleeping pill with some teach back displayed

## 2019-08-18 NOTE — Consult Note (Signed)
CONSULTATION NOTE   Patient Name: Howard Bell Date of Encounter: 08/18/2019 Cardiologist: No primary care provider on file.  Chief Complaint   Consult for new systolic CHF  Patient Profile   54 yo male with ESRD on HD, DM2, bilateral AKA, chronic diastolic CHF and seizures, presented with unresponsiveness from dialysis and possible hypoxemia, found to have a newly reduced LVEF on echo of 25-30%.  HPI   Howard Bell is a 54 y.o. male who is being seen today for the evaluation of new systolic heart failure at the request of Dr. Candiss Norse. This is a 54 year old male with end-stage renal disease on hemodialysis (M, W, F), type 2 diabetes with diabetic gastroparesis, bilateral AKA, chronic diastolic heart failure and seizures.  He presented with unresponsiveness from dialysis and possible hypoxemia.  An echocardiogram was performed which showed a newly reduced LVEF of 25 to 30%, previously the LVEF was normal at 60 to 65% per echo in June 2020.  Personally reviewed the echo yesterday which showed grade 1 diastolic dysfunction and elevated LV end-diastolic pressure.  There was severe LV hypertrophy with global hypokinesis however the apex appeared to be moving normally.  This is concerning for possible multivessel coronary artery disease.  RV dysfunction is also noted and moderate.  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  After speaking with him today, he flatly denies any anginal symptoms.  He has no significant shortness of breath.  He was very surprised to hear that his heart function had reduced.  I told him that this was not unusual that since he is on dialysis it is possible that they have been compensating for any fluid overload related to heart failure without realizing the development of the cardiomyopathy.  PMHx   Past Medical History:  Diagnosis Date  . Arthritis   . Congestive heart failure (CHF) ([place])   . Depression   . 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 Diabetic gastroparesis ([place]) 11/06/2017  . ESRD (end stage renal disease) on dialysis [facility])    M,W.F dialysis  . H/O seasonal allergies   . Hypertension   . Pneumonia   . Sleep apnea    not using it now, on continuous O2 2 L during the day, 3 L during the night    Past Surgical History:  Procedure Laterality Date  . AMPUTATION Right 05/14/2018   Procedure: AMPUTATION BELOW KNEE;  Surgeon: Newt Minion, MD;  Location: [place];  Service: Orthopedics;  Laterality: Right;  . AMPUTATION Right 08/04/2018   Procedure: RIGHT ABOVE KNEE AMPUTATION;  Surgeon: Newt Minion, MD;  Location: [place];  Service: Orthopedics;  Laterality: Right;  . AMPUTATION Left 09/08/2018   Procedure: LEFT ABOVE KNEE AMPUTATION;  Surgeon: Newt Minion, MD;  Location: [place];  Service: Orthopedics;  Laterality: Left;  . APPLICATION OF WOUND VAC Left 09/08/2018   Procedure: Application Of Wound Vac;  Surgeon: Newt Minion, MD;  Location: [place];  Service: Orthopedics;  Laterality: Left;  . ESOPHAGOGASTRODUODENOSCOPY (EGD) WITH PROPOFOL N/A 12/05/2017   Procedure: ESOPHAGOGASTRODUODENOSCOPY (EGD) WITH PROPOFOL;  Surgeon: Doran Stabler, MD;  Location: [facility];  Service: Gastroenterology;  Laterality: N/A;  . HERNIA REPAIR    . IR AV DIALY SHUNT INTRO NEEDLE/INTRACATH INITIAL W/PTA/IMG LEFT  03/30/2017    FAMHx   Family History  Problem Relation Age of Onset  . Diabetes Mother   . Hypertension Father     SOCHx  reports that he has never smoked. He has never used smokeless tobacco. He reports that he does not drink alcohol or use drugs.  Outpatient Medications   No current facility-administered medications on file prior to encounter.   Current Outpatient Medications on File Prior to Encounter  Medication Sig Dispense Refill  . albuterol (PROVENTIL HFA;VENTOLIN HFA) 108 (90 Base) MCG/ACT inhaler Inhale 1 puff into the lungs every 4 (four) hours as needed for wheezing or shortness of  breath.     Marland Kitchen amLODipine (NORVASC) 10 MG tablet Take 1 tablet (10 mg total) by mouth daily. 90 tablet 1  . docusate sodium (COLACE) 100 MG capsule Take 1 capsule (100 mg total) by mouth 2 (two) times daily. 60 capsule 1  . fluticasone (FLONASE) 50 MCG/ACT nasal spray Place 1 spray into both nostrils daily as needed for allergies.     Marland Kitchen insulin lispro (ADMELOG SOLOSTAR) 100 UNIT/ML KwikPen 3 times a day (just before each meal), 6-6-8 units, and pen needles 3/day (Patient taking differently: Inject 6-8 Units into the skin See admin instructions. Per sliding scale method. Doses as follows:  If BG is:  100, 0 units  200, 2 units 300, 2 units  350, 3 units) 15 mL 11  . latanoprost (XALATAN) 0.005 % ophthalmic solution Place 1 drop into both eyes at bedtime.    . metoCLOPramide (REGLAN) 5 MG tablet Take 1 tablet (5 mg total) by mouth 3 (three) times daily before meals. (Patient taking differently: Take 5 mg by mouth 2 (two) times daily before a meal. ) 90 tablet 0  . metoprolol succinate (TOPROL-XL) 50 MG 24 hr tablet Take 50 mg by mouth at bedtime. Take with or immediately following a meal.    . multivitamin (RENA-VIT) TABS tablet Take 1 tablet by mouth at bedtime. 30 tablet 1  . omeprazole (PRILOSEC) 20 MG capsule Take 1 capsule (20 mg total) by mouth at bedtime. 90 capsule 3  . timolol (BETIMOL) 0.5 % ophthalmic solution Place 1 drop into both eyes 2 (two) times daily. 10 mL 1  . acetaminophen (TYLENOL) 325 MG tablet Take 1-2 tablets (325-650 mg total) by mouth every 4 (four) hours as needed for mild pain. (Patient not taking: Reported on 08/16/2019)    . ferric citrate (AURYXIA) 1 GM 210 MG(Fe) tablet Take 1 tablet (210 mg total) by mouth 3 (three) times daily with meals. (Patient not taking: Reported on 08/16/2019) 90 tablet 1  . glucose blood test strip 1 each by Other route as needed for other. Use as instructed 100 each 11  . Lancets (ACCU-CHEK SOFT TOUCH) lancets Use as instructed 100 each 12    . metoprolol succinate (TOPROL-XL) 50 MG 24 hr tablet Take 1 tablet (50 mg total) by mouth at bedtime. Take with a meal. (Patient not taking: Reported on 08/16/2019) 90 tablet 3    Inpatient Medications    Scheduled Meds: . carvedilol  3.125 mg Oral BID WC  . Chlorhexidine Gluconate Cloth  6 each Topical Q0600  . cinacalcet  60 mg Oral Q M,W,F-HD  . docusate sodium  100 mg Oral BID  . doxercalciferol  4 mcg Intravenous Q M,W,F-HD  . heparin  5,000 Units Subcutaneous Q8H  . insulin aspart  0-9 Units Subcutaneous Q6H  . isosorbide-hydrALAZINE  1 tablet Oral TID  . latanoprost  1 drop Both Eyes QHS  . levETIRAcetam  500 mg Oral QHS  . metoCLOPramide  5 mg Oral BID AC  . multivitamin  1 tablet  Oral QHS  . pantoprazole  40 mg Oral Daily  . sevelamer carbonate  2,400 mg Oral TID WC  . sodium chloride flush  3 mL Intravenous Q12H  . timolol  1 drop Both Eyes BID    Continuous Infusions:   PRN Meds: acetaminophen **OR** [DISCONTINUED] acetaminophen, bisacodyl, hydrALAZINE, LORazepam, [DISCONTINUED] ondansetron **OR** ondansetron (ZOFRAN) IV   ALLERGIES   Allergies  Allergen Reactions  . Gabapentin Other (See Comments)    Altered mental status    ROS   Pertinent items noted in HPI and remainder of comprehensive ROS otherwise negative.   Vitals   Vitals:   08/18/19 0400 08/18/19 0500 08/18/19 0600 08/18/19 0753  BP: 126/62   (!) 155/86  Pulse: 85 87  86  Resp: 18 18 19 18   Temp: 98.3 F (36.8 C)   98.2 F (36.8 C)  TempSrc: Oral   Oral  SpO2: 96% 98%  97%  Weight:      Height:        Intake/Output Summary (Last 24 hours) at 08/18/2019 1032 Last data filed at 08/18/2019 0847 Gross per 24 hour  Intake 670 ml  Output 0 ml  Net 670 ml   Filed Weights   08/17/19 0100 08/17/19 0600  Weight: 92.5 kg 92.5 kg    Physical Exam   General appearance: alert and no distress Neck: no carotid bruit, no JVD and thyroid not enlarged, symmetric, no  tenderness/mass/nodules Lungs: diminished breath sounds bilaterally Heart: regular rate and rhythm Abdomen: soft, non-tender; bowel sounds normal; no masses,  no organomegaly Extremities: bilateral AKA's Pulses: 2+ and symmetric Skin: Skin color, texture, turgor normal. No rashes or lesions Neurologic: Mental status: Awake, somewhat somnolent, on continuous EEG monitor Psych: Flat affect  Labs   Results for orders placed or performed during the hospital encounter of 08/16/19 (from the past 48 hour(s))  D-dimer, quantitative (not at [facility])     Status: Abnormal   Collection Time: 08/16/19 11:21 AM  Result Value Ref Range   D-Dimer, Quant 1.21 (H) 0.00 - 0.50 ug/mL-FEU    Comment: (NOTE) At the manufacturer cut-off of 0.50 ug/mL FEU, this assay has been documented to exclude PE with a sensitivity and negative predictive value of 97 to 99%.  At this time, this assay has not been approved by the FDA to exclude DVT/VTE. Results should be correlated with clinical presentation. Performed at [facility], [place] [address] [place], [place] [address]   SARS Coronavirus 2 by RT PCR (hospital order, performed in [facility] hospital lab) Nasopharyngeal Nasopharyngeal Swab     Status: None   Collection Time: 08/16/19 11:44 AM   Specimen: Nasopharyngeal Swab  Result Value Ref Range   SARS Coronavirus 2 NEGATIVE NEGATIVE    Comment: (NOTE) SARS-CoV-2 target nucleic acids are NOT DETECTED. The SARS-CoV-2 RNA is generally detectable in upper and lower respiratory specimens during the acute phase of infection. The lowest concentration of SARS-CoV-2 viral copies this assay can detect is 250 copies / mL. A negative result does not preclude SARS-CoV-2 infection and should not be used as the sole basis for treatment or other patient management decisions.  A negative result may occur with improper specimen collection / handling, submission of specimen other than nasopharyngeal swab, presence of  viral mutation(s) within the areas targeted by this assay, and inadequate number of viral copies (<250 copies / mL). A negative result must be combined with clinical observations, patient history, and epidemiological information. Fact Sheet for Patients:  StrictlyIdeas.no Fact Sheet for Healthcare Providers: BankingDealers.co.za This test is not yet approved or cleared  by the [place] FDA and has been authorized for detection and/or diagnosis of SARS-CoV-2 by FDA under an Emergency Use Authorization (EUA).  This EUA will remain in effect (meaning this test can be used) for the duration of the COVID-19 declaration under Section 564(b)(1) of the Act, 21 U.S.C. section 360bbb-3(b)(1), unless the authorization is terminated or revoked sooner. Performed at [facility], [place] [address] [place], [place] [address]   CBG monitoring, ED     Status: Abnormal   Collection Time: 08/16/19  6:41 PM  Result Value Ref Range   Glucose-Capillary 185 (H) 70 - 99 mg/dL    Comment: Glucose reference range applies only to samples taken after fasting for at least 8 hours.  Brain natriuretic peptide     Status: Abnormal   Collection Time: 08/16/19  6:55 PM  Result Value Ref Range   B Natriuretic Peptide 1,239.1 (H) 0.0 - 100.0 pg/mL    Comment: Performed at [place] [address] [place], [place] [address]  Procalcitonin - Baseline     Status: None   Collection Time: 08/16/19  6:55 PM  Result Value Ref Range   Procalcitonin 0.37 ng/mL    Comment:        Interpretation: PCT (Procalcitonin) <= 0.5 ng/mL: Systemic infection (sepsis) is not likely. Local bacterial infection is possible. (NOTE)       Sepsis PCT Algorithm           Lower Respiratory Tract                                      Infection PCT Algorithm    ----------------------------     ----------------------------         PCT < 0.25 ng/mL                PCT < 0.10  ng/mL         Strongly encourage             Strongly discourage   discontinuation of antibiotics    initiation of antibiotics    ----------------------------     -----------------------------       PCT 0.25 - 0.50 ng/mL            PCT 0.10 - 0.25 ng/mL               OR       >80% decrease in PCT            Discourage initiation of                                            antibiotics      Encourage discontinuation           of antibiotics    ----------------------------     -----------------------------         PCT >= 0.50 ng/mL              PCT 0.26 - 0.50 ng/mL               AND        <80% decrease in PCT  Encourage initiation of                                             antibiotics       Encourage continuation           of antibiotics    ----------------------------     -----------------------------        PCT >= 0.50 ng/mL                  PCT > 0.50 ng/mL               AND         increase in PCT                  Strongly encourage                                      initiation of antibiotics    Strongly encourage escalation           of antibiotics                                     -----------------------------                                           PCT <= 0.25 ng/mL                                                 OR                                        > 80% decrease in PCT                                     Discontinue / Do not initiate                                             antibiotics Performed at [facility], [address] [place], [place] [address]   Hemoglobin A1c     Status: Abnormal   Collection Time: 08/16/19  6:55 PM  Result Value Ref Range   Hgb A1c MFr Bld 10.7 (H) 4.8 - 5.6 %    Comment: (NOTE) Pre diabetes:          5.7%-6.4% Diabetes:              >6.4% Glycemic control for   <7.0% adults with diabetes    Mean Plasma Glucose 260.39 mg/dL    Comment: Performed at [facility] [address],  [place], [place] [address]  Troponin I (High Sensitivity)     Status: Abnormal  Collection Time: 08/16/19  6:55 PM  Result Value Ref Range   Troponin I (High Sensitivity) 225 (HH) <18 ng/L    Comment: CRITICAL RESULT CALLED TO, READ BACK BY AND VERIFIED WITH: M.[place] 08/16/19 CLARK,S (NOTE) Elevated high sensitivity troponin I (hsTnI) values and significant  changes across serial measurements may suggest ACS but many other  chronic and acute conditions are known to elevate hsTnI results.  Refer to the Links section for chest pain algorithms and additional  guidance. Performed at [facility], [place] [address], [place], [place] [address]   Troponin I (High Sensitivity)     Status: Abnormal   Collection Time: 08/16/19  7:40 PM  Result Value Ref Range   Troponin I (High Sensitivity) 270 (HH) <18 ng/L    Comment: CRITICAL VALUE NOTED.  VALUE IS CONSISTENT WITH PREVIOUSLY REPORTED AND CALLED VALUE. (NOTE) Elevated high sensitivity troponin I (hsTnI) values and significant  changes across serial measurements may suggest ACS but many other  chronic and acute conditions are known to elevate hsTnI results.  Refer to the Links section for chest pain algorithms and additional  guidance. Performed at [facility], [place] [address] [place], [place] [address]   HIV Antibody (routine testing w rflx)     Status: None   Collection Time: 08/16/19 10:58 PM  Result Value Ref Range   HIV Screen 4th Generation wRfx Non Reactive Non Reactive    Comment: Performed at [facility], [place] [address] [place], [place] [address]  CBC     Status: Abnormal   Collection Time: 08/16/19 10:58 PM  Result Value Ref Range   WBC 5.4 4.0 - 10.5 K/uL   RBC 5.00 4.22 - 5.81 MIL/uL   Hemoglobin 15.6 13.0 - 17.0 g/dL   HCT 48.4 39.0 - 52.0 %   MCV 96.8 80.0 - 100.0 fL   MCH 31.2 26.0 - 34.0 pg   MCHC 32.2 30.0 - 36.0 g/dL   RDW 16.2 (H) 11.5 - 15.5 %   Platelets 128 (L) 150 - 400 K/uL     Comment: REPEATED TO VERIFY   nRBC 0.4 (H) 0.0 - 0.2 %    Comment: Performed at [facility], [place] [address], [place], [place] [address]  Creatinine, serum     Status: Abnormal   Collection Time: 08/16/19 10:58 PM  Result Value Ref Range   Creatinine, Ser 11.38 (H) 0.61 - 1.24 mg/dL   GFR calc non Af Amer 5 (L) >60 mL/min   GFR calc Af Amer 5 (L) >60 mL/min    Comment: Performed at [place] [facility] [address], [place], [place] [address]  TSH     Status: Abnormal   Collection Time: 08/16/19 10:58 PM  Result Value Ref Range   TSH 5.715 (H) 0.350 - 4.500 uIU/mL    Comment: Performed by a 3rd Generation assay with a functional sensitivity of <=0.01 uIU/mL. Performed at [facility], [place] [address] [place], [place] [address]   Troponin I (High Sensitivity)     Status: Abnormal   Collection Time: 08/16/19 10:58 PM  Result Value Ref Range   Troponin I (High Sensitivity) 226 (HH) <18 ng/L    Comment: CRITICAL VALUE NOTED.  VALUE IS CONSISTENT WITH PREVIOUSLY REPORTED AND CALLED VALUE. (NOTE) Elevated high sensitivity troponin I (hsTnI) values and significant  changes across serial measurements may suggest ACS but many other  chronic and acute conditions are known to elevate  hsTnI results.  Refer to the Links section for chest pain algorithms and additional  guidance. Performed at [facility], [place] [address] [place], [place] [address]   Glucose, capillary     Status: Abnormal   Collection Time: 08/17/19 12:25 AM  Result Value Ref Range   Glucose-Capillary 114 (H) 70 - 99 mg/dL    Comment: Glucose reference range applies only to samples taken after fasting for at least 8 hours.  Glucose, capillary     Status: Abnormal   Collection Time: 08/17/19  4:01 AM  Result Value Ref Range   Glucose-Capillary 117 (H) 70 - 99 mg/dL    Comment: Glucose reference range applies only to samples taken after fasting for at least 8 hours.  CBC     Status: Abnormal   Collection  Time: 08/17/19  4:07 AM  Result Value Ref Range   WBC 5.8 4.0 - 10.5 K/uL   RBC 4.76 4.22 - 5.81 MIL/uL   Hemoglobin 15.0 13.0 - 17.0 g/dL   HCT 45.3 39.0 - 52.0 %   MCV 95.2 80.0 - 100.0 fL   MCH 31.5 26.0 - 34.0 pg   MCHC 33.1 30.0 - 36.0 g/dL   RDW 16.1 (H) 11.5 - 15.5 %   Platelets PLATELET CLUMPS NOTED ON SMEAR, UNABLE TO ESTIMATE 150 - 400 K/uL    Comment: Immature Platelet Fraction may be clinically indicated, consider ordering this additional test [id number]    nRBC 0.7 (H) 0.0 - 0.2 %    Comment: Performed at [facility], [place] [address], [place], [place] [address]  Glucose, capillary     Status: Abnormal   Collection Time: 08/17/19  6:18 AM  Result Value Ref Range   Glucose-Capillary 117 (H) 70 - 99 mg/dL    Comment: Glucose reference range applies only to samples taken after fasting for at least 8 hours.  Comprehensive metabolic panel     Status: Abnormal   Collection Time: 08/17/19  8:28 AM  Result Value Ref Range   Sodium 133 (L) 135 - 145 mmol/L   Potassium 5.1 3.5 - 5.1 mmol/L   Chloride 91 (L) 98 - 111 mmol/L   CO2 27 22 - 32 mmol/L   Glucose, Bld 126 (H) 70 - 99 mg/dL    Comment: Glucose reference range applies only to samples taken after fasting for at least 8 hours.   BUN 42 (H) 6 - 20 mg/dL   Creatinine, Ser 11.86 (H) 0.61 - 1.24 mg/dL   Calcium 8.7 (L) 8.9 - 10.3 mg/dL   Total Protein 7.3 6.5 - 8.1 g/dL   Albumin 3.2 (L) 3.5 - 5.0 g/dL   AST 25 15 - 41 U/L   ALT 27 0 - 44 U/L   Alkaline Phosphatase 77 38 - 126 U/L   Total Bilirubin 1.1 0.3 - 1.2 mg/dL   GFR calc non Af Amer 4 (L) >60 mL/min   GFR calc Af Amer 5 (L) >60 mL/min   Anion gap 15 5 - 15    Comment: Performed at [facility] [address], [place], [place] [address]  Renal function panel     Status: Abnormal   Collection Time: 08/17/19 10:12 AM  Result Value Ref Range   Sodium 134 (L) 135 - 145 mmol/L   Potassium 5.3 (H) 3.5 - 5.1 mmol/L   Chloride 91 (L) 98 - 111 mmol/L    CO2 30 22 - 32 mmol/L   Glucose, Bld 143 (  H) 70 - 99 mg/dL    Comment: Glucose reference range applies only to samples taken after fasting for at least 8 hours.   BUN 41 (H) 6 - 20 mg/dL   Creatinine, Ser 12.04 (H) 0.61 - 1.24 mg/dL   Calcium 8.7 (L) 8.9 - 10.3 mg/dL   Phosphorus 8.4 (H) 2.5 - 4.6 mg/dL   Albumin 3.2 (L) 3.5 - 5.0 g/dL   GFR calc non Af Amer 4 (L) >60 mL/min   GFR calc Af Amer 5 (L) >60 mL/min   Anion gap 13 5 - 15    Comment: Performed at [facility] [address], [place], [place] [address]  Glucose, capillary     Status: Abnormal   Collection Time: 08/17/19 12:19 PM  Result Value Ref Range   Glucose-Capillary 128 (H) 70 - 99 mg/dL    Comment: Glucose reference range applies only to samples taken after fasting for at least 8 hours.  MRSA PCR Screening     Status: None   Collection Time: 08/17/19  3:08 PM   Specimen: Nasal Mucosa; Nasopharyngeal  Result Value Ref Range   MRSA by PCR NEGATIVE NEGATIVE    Comment:        The GeneXpert MRSA Assay (FDA approved for NASAL specimens only), is one component of a comprehensive MRSA colonization surveillance program. It is not intended to diagnose MRSA infection nor to guide or monitor treatment for MRSA infections. Performed at [facility], [place] [address] [place], [place] [address]   Glucose, capillary     Status: Abnormal   Collection Time: 08/17/19  6:49 PM  Result Value Ref Range   Glucose-Capillary 181 (H) 70 - 99 mg/dL    Comment: Glucose reference range applies only to samples taken after fasting for at least 8 hours.  Glucose, capillary     Status: Abnormal   Collection Time: 08/17/19 11:13 PM  Result Value Ref Range   Glucose-Capillary 165 (H) 70 - 99 mg/dL    Comment: Glucose reference range applies only to samples taken after fasting for at least 8 hours.   Comment 1 Notify RN    Comment 2 Document in Chart   Glucose, capillary     Status: Abnormal   Collection Time: 08/18/19  6:03  AM  Result Value Ref Range   Glucose-Capillary 173 (H) 70 - 99 mg/dL    Comment: Glucose reference range applies only to samples taken after fasting for at least 8 hours.   Comment 1 Notify RN    Comment 2 Document in Chart     ECG   NSR, PRWP, prolonged QTc at 503 msec - Personally Reviewed  Telemetry   Sinus rhythm - Personally Reviewed  Radiology   CT Head Wo Contrast  Result Date: 08/16/2019 CLINICAL DATA:  Syncope at dialysis yesterday, cough EXAM: CT HEAD WITHOUT CONTRAST TECHNIQUE: Contiguous axial images were obtained from the base of the skull through the vertex without intravenous contrast. COMPARISON:  08/15/2018 FINDINGS: Brain: Chronic ischemic changes are seen within the right cerebellar hemisphere and left occipital lobe, with encephalomalacia and ex vacuo dilatation of the occipital horn of the left lateral ventricle. Small chronic right parietal cortical infarct is again noted and stable. No sign of acute infarct or hemorrhage. The lateral ventricles and midline structures are stable. No acute extra-axial fluid collections. No mass effect. Vascular: No hyperdense vessel or unexpected calcification. Skull: Normal. Negative for fracture or focal lesion. Sinuses/Orbits: No acute finding. Other: None.  IMPRESSION: 1. Stable chronic ischemic changes involving the right cerebellar hemisphere, left occipital lobe, and right parietal cortex. 2. No acute infarct or hemorrhage. Electronically Signed   By: Randa Ngo M.D.   On: 08/16/2019 16:52   CT Angio Chest PE W and/or Wo Contrast  Result Date: 08/16/2019 CLINICAL DATA:  Syncope yesterday at dialysis, hypoxia, cough, worsening shortness of breath EXAM: CT ANGIOGRAPHY CHEST WITH CONTRAST TECHNIQUE: Multidetector CT imaging of the chest was performed using the standard protocol during bolus administration of intravenous contrast. Multiplanar CT image reconstructions and MIPs were obtained to evaluate the vascular anatomy. CONTRAST:   137mL OMNIPAQUE IOHEXOL 350 MG/ML SOLN COMPARISON:  08/16/2019 FINDINGS: Cardiovascular: This is a technically adequate evaluation of the pulmonary vasculature. No filling defects or pulmonary emboli. The heart is enlarged without pericardial effusion. Normal caliber of the thoracic aorta. There is extensive atherosclerosis within the aortic arch and coronary vasculature. Stable stents within the left subclavian and axillary veins. 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 Mediastinum/Nodes: Lymphadenopathy involving the subcarinal and right paratracheal regions is stable. Trachea and esophagus are unremarkable. The thyroid is normal. Lungs/Pleura: There is bilateral ground-glass airspace disease, with denser consolidation in the right lower lobe. No effusion or pneumothorax. Central airways are patent. Upper Abdomen: Reflux of contrast into the hepatic veins may suggest cardiac dysfunction. No other upper abdominal findings. 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 No acute or destructive bony abnormalities. There are numerous venous collaterals within the right chest wall consistent with chronic right brachiocephalic venous occlusion. Reconstructed images demonstrate no additional findings. Review of the MIP images confirms the above findings. IMPRESSION: 1. No evidence of pulmonary embolus. 2. Bilateral ground-glass airspace disease, with denser consolidation in the right lower lobe. I would favor congestive heart failure given the corresponding findings of cardiomegaly and reflux of contrast into the hepatic veins. 3. Stable nonspecific mediastinal lymphadenopathy. 4. 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 5.  Aortic Atherosclerosis (ICD10-I70.0). Electronically Signed   By: Randa Ngo M.D.   On: 08/16/2019 16:57   DG Chest Port 1 View  Result Date: 08/16/2019 CLINICAL DATA:  Cough. EXAM:  PORTABLE CHEST 1 VIEW COMPARISON:  09/09/2018 and 08/23/2018 FINDINGS: Lungs are hypoinflated and demonstrate hazy opacification over the right mid to lower lung which is not significantly changed compared to the previous exams and may be due to mild chronic prominence of the bronchovascular markings are mild interstitial disease. No acute lobar consolidation or effusion. Stable cardiomegaly. Left subclavian stent unchanged. Remainder of the exam is unchanged. IMPRESSION: 1. No acute findings. Chronic hazy opacification over the right mid to lower. 2.  Cardiomegaly. Electronically Signed   By: Marin Olp M.D.   On: 08/16/2019 11:17   EEG adult  Result Date: 08/17/2019 Lora Havens, MD     08/17/2019  8:58 AM Patient Name: Isabella Ida MRN: [id number] Epilepsy Attending: Lora Havens Referring Physician/Provider: Dr. Karena Addison Aroor Date: 08/16/2019 Duration: 21.08 minutes Patient history: 54 year old male with staring episodes concerning for seizures.  Also had generalized tonic-clonic seizure-like episode.  EEG to evaluate for seizures. Level of alertness: Awake AEDs during EEG study: Keppra Technical aspects: This EEG study was done with scalp electrodes positioned according to the 10-20 International system of electrode placement. Electrical activity was acquired at a sampling rate of 500Hz  and reviewed with a high frequency filter of 70Hz  and a low frequency filter of 1Hz . EEG data were recorded continuously and  digitally stored. Description: During awake state, no clear posterior dominant rhythm was seen.  EEG showed continuous generalized polymorphic mixed frequencies with predominantly 6 to 8 Hz theta-alpha activity as well as intermittent 2 to 3 Hz delta activity.  Hyperventilation and photic stimulation were not performed.   ABNORMALITY -Continuous slow, generalized IMPRESSION: This study is suggestive of mild diffuse encephalopathy, nonspecific etiology. No seizures or epileptiform  discharges were seen throughout the recording. Priyanka Barbra Sarks   Overnight EEG with video  Result Date: 08/17/2019 Lora Havens, MD     08/18/2019  8:31 AM Patient Name: Akin Yi MRN: [id number] Epilepsy Attending: Lora Havens Referring Physician/Provider: Dr. Karena Addison Aroor Duration: 08/16/2019 1701 to 08/17/2019 1701  Patient history: 54 year old male with staring episodes concerning for seizures.  Also had generalized tonic-clonic seizure-like episode.  EEG to evaluate for seizures.  Level of alertness: Awake, asleep  AEDs during EEG study: Keppra  Technical aspects: This EEG study was done with scalp electrodes positioned according to the 10-20 International system of electrode placement. Electrical activity was acquired at a sampling rate of 500Hz  and reviewed with a high frequency filter of 70Hz  and a low frequency filter of 1Hz . EEG data were recorded continuously and digitally stored.  Description: The posterior dominant rhythm consists of 9-10 Hz activity of moderate voltage (25-35 uV) seen predominantly in posterior head regions, symmetric and reactive to eye opening and eye closing. Sleep was characterized by vertex waves, sleep spindles (12 to 14 Hz), maximal frontocentral region and posterior occipital sharp transients of sleep.  Hyperventilation and photic stimulation were not performed.   1 event was recorded at 1:32 on 08/17/2019.  Patient was laying in bed watching TV.  When nurse walked in and asked patient a question, he initially did not respond and was noted to have brief nonrhythmic jerking movement of bilateral upper extremity.  He eventually responded to the nurses question and asked if this event is being captured.  Concomitant EEG review before, during and after the event did not show any EEG change to suggest seizure.  IMPRESSION: This study is within normal limits. No seizures or epileptiform discharges were seen throughout the recording. One episode of decreased  responsiveness was recorded as described above around 1:32 AM on 08/17/2019 without concomitant EEG change and was a nonepileptic event. Lora Havens   ECHOCARDIOGRAM COMPLETE  Result Date: 08/17/2019    ECHOCARDIOGRAM REPORT   Patient Name:   ATTILIO ZEITLER Date of Exam: 08/17/2019 Medical Rec #:  [id number]         Height:       72.0 in Accession #:    [id number]        Weight:       203.9 lb Date of Birth:  April 13, 1965         BSA:          2.148 m Patient Age:    23 years          BP:           134/81 mmHg Patient Gender: M                 HR:           80 bpm. Exam Location:  Inpatient Procedure: 2D Echo, Cardiac Doppler, Color Doppler and Intracardiac            Opacification Agent Indications:    CHF-Acute Diastolic  History:        Patient has prior  history of Echocardiogram examinations, most                 recent 09/05/2018. Risk Factors:Diabetes, Sleep Apnea and                 Hypertension. ESRD.  Sonographer:    Clayton Lefort RDCS (AE) Referring Phys: [id number] Margaree Mackintosh [facility]  Sonographer Comments: Suboptimal subcostal window. Image acquisition challenging due to patient body habitus. IMPRESSIONS  1. Left ventricular ejection fraction, by estimation, is 25 to 30%. The left ventricle has severely decreased function. The left ventricle demonstrates global hypokinesis with regional variation and spares the apex. There is severe left ventricular hypertrophy. Left ventricular diastolic parameters are consistent with Grade I diastolic dysfunction (impaired relaxation). Elevated left ventricular end-diastolic pressure.  2. Right ventricular systolic function is moderately reduced. The right ventricular size is mildly enlarged.  3. Left atrial size was mildly dilated.  4. Right atrial size was severely dilated.  5. The mitral valve is abnormal. Trivial mitral valve regurgitation.  6. Tricuspid valve regurgitation is mild to moderate.  7. The aortic valve is tricuspid. Aortic valve regurgitation is not  visualized. Mild aortic valve sclerosis is present, with no evidence of aortic valve stenosis.  8. Aortic dilatation noted. There is borderline dilatation of the ascending aorta measuring 39 mm. Comparison(s): Changes from prior study are noted. 09/05/18: 60-65%, mild LVH. Dr. Candiss Norse notified of acute changes. FINDINGS  Left Ventricle: Left ventricular ejection fraction, by estimation, is 25 to 30%. The left ventricle has severely decreased function. The left ventricle demonstrates global hypokinesis. Definity contrast agent was given IV to delineate the left ventricular endocardial borders. The left ventricular internal cavity size was normal in size. There is severe left ventricular hypertrophy. Left ventricular diastolic parameters are consistent with Grade I diastolic dysfunction (impaired relaxation). Elevated left ventricular end-diastolic pressure. Right Ventricle: The right ventricular size is mildly enlarged. No increase in right ventricular wall thickness. Right ventricular systolic function is moderately reduced. Left Atrium: Left atrial size was mildly dilated. Right Atrium: Right atrial size was severely dilated. Pericardium: Trivial pericardial effusion is present. The pericardial effusion is circumferential. Mitral Valve: The mitral valve is abnormal. There is mild thickening of the mitral valve leaflet(s). Mild to moderate mitral annular calcification. Trivial mitral valve regurgitation. MV peak gradient, 3.9 mmHg. The mean mitral valve gradient is 2.0 mmHg. Tricuspid Valve: The tricuspid valve is grossly normal. Tricuspid valve regurgitation is mild to moderate. Aortic Valve: The aortic valve is tricuspid. Aortic valve regurgitation is not visualized. Mild aortic valve sclerosis is present, with no evidence of aortic valve stenosis. Pulmonic Valve: The pulmonic valve was normal in structure. Pulmonic valve regurgitation is trivial. Aorta: Aortic dilatation noted. There is borderline dilatation of the  ascending aorta measuring 39 mm. Venous: The inferior vena cava was not well visualized. IAS/Shunts: There is left bowing of the interatrial septum, suggestive of elevated right atrial pressure. No atrial level shunt detected by color flow Doppler.  LEFT VENTRICLE PLAX 2D LVIDd:         4.64 cm  Diastology LVIDs:         4.04 cm  LV e' lateral:   4.31 cm/s LV PW:         1.80 cm  LV E/e' lateral: 18.6 LV IVS:        1.99 cm  LV e' medial:    2.44 cm/s LVOT diam:     2.20 cm  LV E/e' medial:  32.8 LV  SV:         29 LV SV Index:   13 LVOT Area:     3.80 cm  RIGHT VENTRICLE RV Basal diam:  3.92 cm RV Mid diam:    3.71 cm RV S prime:     5.12 cm/s TAPSE (M-mode): 1.1 cm LEFT ATRIUM              Index       RIGHT ATRIUM           Index LA diam:        3.40 cm  1.58 cm/m  RA Area:     29.00 cm LA Vol (A2C):   103.0 ml 47.95 ml/m RA Volume:   105.00 ml 48.88 ml/m LA Vol (A4C):   53.5 ml  24.90 ml/m LA Biplane Vol: 80.5 ml  37.47 ml/m  AORTIC VALVE AV Area (Vmax):    1.92 cm AV Area (Vmean):   1.69 cm AV Area (VTI):     1.62 cm AV Vmax:           96.30 cm/s AV Vmean:          72.600 cm/s AV VTI:            0.178 m AV Peak Grad:      3.7 mmHg AV Mean Grad:      2.0 mmHg LVOT Vmax:         48.70 cm/s LVOT Vmean:        32.200 cm/s LVOT VTI:          0.076 m LVOT/AV VTI ratio: 0.43  AORTA Ao Root diam: 3.70 cm Ao Asc diam:  3.90 cm MITRAL VALVE               TRICUSPID VALVE MV Area (PHT): 4.89 cm    TR Peak grad:   30.5 mmHg MV Peak grad:  3.9 mmHg    TR Vmax:        276.00 cm/s MV Mean grad:  2.0 mmHg MV Vmax:       0.99 m/s    SHUNTS MV Vmean:      57.4 cm/s   Systemic VTI:  0.08 m MV Decel Time: 155 msec    Systemic Diam: 2.20 cm MV E velocity: 80.10 cm/s MV A velocity: 91.30 cm/s MV E/A ratio:  0.88 Lyman Bishop MD Electronically signed by Lyman Bishop MD Signature Date/Time: 08/17/2019/5:52:07 PM    Final     Cardiac Studies   N/A  Impression   1. Active Problems: 2.   Seizures  ([place]) 3.   Recommendation   1. Acute combined systolic and diastolic congestive heart failure- new finding with some regional WMA's - concern for multivessel CAD in this diabetic patient with ESRD. No anginal symptoms. He is a poor revascularization candidate - not ambulatory due to bilateral AKA's. He denies anginal symptoms. Agree with plan to start coreg, hydralazine and imdur. Not a candidate for ACE-I/ARB/Entresto due to renal failure.  2. Insulin-dependent type 2 diabetes- A1c 10.7 (was 7.3 just 3 months ago), will need better optimization with insulin and diet.   3. End-stage renal disease on hemodialysis- volume status managed by nephrology.   4. OSA on oxygen - continue current therapy  5.  Elevated troponin - flat elevated 225, 270, 226 (more consistent with CHF/ESRD). Not an ideal candidate for ischemia evaluation.  Thanks for the consultation. Cardiology will follow with you.  Time Spent Directly with Patient:  I have  spent a total of 45 minutes with the patient reviewing hospital notes, telemetry, EKGs, labs and examining the patient as well as establishing an assessment and plan that was discussed personally with the patient.  > 50% of time was spent in direct patient care.  Length of Stay:  LOS: 2 days   Pixie Casino, MD, [facility], [place] Director of the [facility]  [facility] Diplomate of the American Board of Clinical Lipidology Attending Cardiologist  Direct Dial: [phone]  Fax: [phone]  Website:  [email or web address] 08/18/2019, 10:32 AM

## 2019-08-18 NOTE — Plan of Care (Signed)
Discussed with patient plan of care for the evening, pain management and MRI and Dialysis on hold due to being on EEG for the night with some teach back displayed

## 2019-08-18 NOTE — Progress Notes (Addendum)
Neuro Progress Note  Subjective: No further seizures. Pt is more alert, but still somewhat sleepy today. Pt has no new complaints.  Examination  Vital signs in last 24 hours: Temp:  [98 F (36.7 C)-98.3 F (36.8 C)] 98 F (36.7 C) (05/29 1137) Pulse Rate:  [78-91] 82 (05/29 1137) Resp:  [12-34] 18 (05/29 1137) BP: (96-155)/(62-93) 153/77 (05/29 1137) SpO2:  [93 %-98 %] 98 % (05/29 1137)  General: lying in bed; no acute distress CVS: pulse-normal rate and rhythm RS: breathing comfortably Extremities: normal   Neuro: MS: Sleepy, but alerts easily. Oriented, follows commands CN: pupils equal and reactive,  EOMI, face symmetric, tongue midline, normal sensation over face, Motor: 5/5 strength in upper extremities; bilat AKAs Coordination: normal Gait: not tested; unable  Basic Metabolic Panel: Recent Labs  Lab 08/16/19 0903 08/16/19 2258 08/17/19 0828 08/17/19 1012  NA 136  --  133* 134*  K 4.7  --  5.1 5.3*  CL 90*  --  91* 91*  CO2 32  --  27 30  GLUCOSE 227*  --  126* 143*  BUN 29*  --  42* 41*  CREATININE 9.84* 11.38* 11.86* 12.04*  CALCIUM 9.1  --  8.7* 8.7*  PHOS  --   --   --  8.4*    CBC: Recent Labs  Lab 08/16/19 0903 08/16/19 2258 08/17/19 0407  WBC 5.9 5.4 5.8  HGB 16.1 15.6 15.0  HCT 51.0 48.4 45.3  MCV 98.1 96.8 95.2  PLT 129* 128* PLATELET CLUMPS NOTED ON SMEAR, UNABLE TO ESTIMATE     Coagulation Studies: No results for input(s): LABPROT, INR in the last 72 hours.  Imaging Reviewed:     ASSESSMENT AND PLAN 54 year old male with ESRD on dialysis, obstructive sleep apnea, type 2 diabetes mellitus, bilateral amputation due to severe PVD, hypertension with past history of seizures no longer on Dilantin presents to emergency department with 3 episodes of altered awareness concerning for seizures.  Patient was placed on overnight EEG- no seizures or epileptiform discharges throughout the recording. Encephalopathy improving  slowly.  Recommendations D/c EEG MRI brain pending Continue Keppra 500 mg nightly Seizure precautions We will f/u on MRI  Desiree Metzger-Cihelka, ARNP-C, ANVP-BC Pager: [phone] For questions after 7pm please refer to AMION to reach the Neurologist on call   NEUROHOSPITALIST ADDENDUM Performed a face to face diagnostic evaluation.   I have reviewed the contents of history and physical exam as documented by PA/ARNP/Resident and agree with above documentation.  I have discussed and formulated the above plan as documented. Edits to the note have been made as needed.  No seizures on EEG. Patient more awake today. Continue Keppra and f/u MRI brain     Landers Prajapati MD [facility] [phone]   If 7pm to 7am, please call on call as listed on AMION.

## 2019-08-18 NOTE — Progress Notes (Signed)
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  He was brought into the emergency room with shaking episodes and episodes of unresponsiveness.  He has been evaluated by neurology.  EEG study has been interpreted as having no epileptiform discharges.  Patient is off schedule due to high dialysis volumes 08/17/2019  Blood pressure 155/86 pulse 85 temperature 98.2 O2 sats 91% 4 L nasal cannula  Sodium 134 potassium 5.3 chloride 91 CO2 30 BUN 41 creatinine 12 glucose 143 phosphorus 8.4 calcium 8.7 albumin 3.2 hemoglobin 15  Cinacalcet 60 mg Monday Wednesday Friday, Hectorol 4 mcg Monday Wednesday Friday, insulin sliding scale, Keppra 500 mg nightly, Reglan 5 mg twice daily, Protonix 40 mg daily, multivitamins 1 daily, Renvela 2.4 g 3 times daily with meals.    Objective:  Vital signs in last 24 hours:  Temp:  [98 F (36.7 C)-98.5 F (36.9 C)] 98.2 F (36.8 C) (05/29 0753) Pulse Rate:  [78-91] 86 (05/29 0753) Resp:  [12-34] 18 (05/29 0753) BP: (96-155)/(62-93) 155/86 (05/29 0753) SpO2:  [93 %-98 %] 98 % (05/29 0500)  Weight change:  Filed Weights   08/17/19 0100 08/17/19 0600  Weight: 92.5 kg 92.5 kg    Intake/Output: I/O last 3 completed shifts: In: 950 [P.O.:940; I.V.:10] Out: 0    Intake/Output this shift:  No intake/output data recorded.  General: Chronically ill appearing older male in NAD. Head: Normocephalic, atraumatic, sclera anicteric, mucus membranes are moist Neck: Supple. JVD not elevated. Lungs: Clear bilaterally to auscultation without wheezes, rales, or rhonchi. Breathing is unlabored. Heart: RRR with S1 S2. No murmurs, rubs, or gallops appreciated. Abdomen: Soft, non-tender, non-distended with normoactive  bowel sounds. No rebound/guarding. No obvious abdominal masses. Lower extremities: Bilateral AKA no stump edema  Neuro: Alert and oriented X 3. Moves all extremities spontaneously. Psych:  Responds to questions appropriately with a normal affect. Dialysis Access: L AVG + bruit   Basic Metabolic Panel: Recent Labs  Lab 08/16/19 0903 08/16/19 2258 08/17/19 0828 08/17/19 1012  NA 136  --  133* 134*  K 4.7  --  5.1 5.3*  CL 90*  --  91* 91*  CO2 32  --  27 30  GLUCOSE 227*  --  126* 143*  BUN 29*  --  42* 41*  CREATININE 9.84* 11.38* 11.86* 12.04*  CALCIUM 9.1  --  8.7* 8.7*  PHOS  --   --   --  8.4*    Liver Function Tests: Recent Labs  Lab 08/17/19 0828 08/17/19 1012  AST 25  --   ALT 27  --   ALKPHOS 77  --   BILITOT 1.1  --   PROT 7.3  --   ALBUMIN 3.2* 3.2*   No results for input(s): LIPASE, AMYLASE in the last 168 hours. No results for input(s): AMMONIA in the last 168 hours.  CBC: Recent Labs  Lab 08/16/19 0903 08/16/19 2258 08/17/19 0407  WBC 5.9 5.4 5.8  HGB 16.1 15.6 15.0  HCT 51.0 48.4 45.3  MCV 98.1 96.8 95.2  PLT 129* 128* PLATELET CLUMPS NOTED ON SMEAR, UNABLE TO ESTIMATE    Cardiac Enzymes: No results for input(s): CKTOTAL, CKMB, CKMBINDEX, TROPONINI in the last 168 hours.  BNP: Invalid input(s): POCBNP  CBG: Recent Labs  Lab 08/17/19 0618 08/17/19 1219 08/17/19 1849 08/17/19  2313 08/18/19 [place]*    Microbiology: Results for orders placed or performed during the hospital encounter of 08/16/19  SARS Coronavirus 2 by RT PCR (hospital order, performed in [facility] hospital lab) Nasopharyngeal Nasopharyngeal Swab     Status: None   Collection Time: 08/16/19 11:44 AM   Specimen: Nasopharyngeal Swab  Result Value Ref Range Status   SARS Coronavirus 2 NEGATIVE NEGATIVE Final    Comment: (NOTE) SARS-CoV-2 target nucleic acids are NOT DETECTED. The SARS-CoV-2 RNA is generally detectable in upper and  lower respiratory specimens during the acute phase of infection. The lowest concentration of SARS-CoV-2 viral copies this assay can detect is 250 copies / mL. A negative result does not preclude SARS-CoV-2 infection and should not be used as the sole basis for treatment or other patient management decisions.  A negative result may occur with improper specimen collection / handling, submission of specimen other than nasopharyngeal swab, presence of viral mutation(s) within the areas targeted by this assay, and inadequate number of viral copies (<250 copies / mL). A negative result must be combined with clinical observations, patient history, and epidemiological information. Fact Sheet for Patients:   StrictlyIdeas.no Fact Sheet for Healthcare Providers: BankingDealers.co.za This test is not yet approved or cleared  by the [place] FDA and has been authorized for detection and/or diagnosis of SARS-CoV-2 by FDA under an Emergency Use Authorization (EUA).  This EUA will remain in effect (meaning this test can be used) for the duration of the COVID-19 declaration under Section 564(b)(1) of the Act, 21 U.S.C. section 360bbb-3(b)(1), unless the authorization is terminated or revoked sooner. Performed at [facility], [place] [address], [place], [place] [address]   MRSA PCR Screening     Status: None   Collection Time: 08/17/19  3:08 PM   Specimen: Nasal Mucosa; Nasopharyngeal  Result Value Ref Range Status   MRSA by PCR NEGATIVE NEGATIVE Final    Comment:        The GeneXpert MRSA Assay (FDA approved for NASAL specimens only), is one component of a comprehensive MRSA colonization surveillance program. It is not intended to diagnose MRSA infection nor to guide or monitor treatment for MRSA infections. Performed at [facility], [place] [address] [place], [place] [address]     Coagulation Studies: No results for input(s):  LABPROT, INR in the last 72 hours.  Urinalysis: No results for input(s): COLORURINE, LABSPEC, PHURINE, GLUCOSEU, HGBUR, BILIRUBINUR, KETONESUR, PROTEINUR, UROBILINOGEN, NITRITE, LEUKOCYTESUR in the last 72 hours.  Invalid input(s): APPERANCEUR    Imaging: CT Head Wo Contrast  Result Date: 08/16/2019 CLINICAL DATA:  Syncope at dialysis yesterday, cough EXAM: CT HEAD WITHOUT CONTRAST TECHNIQUE: Contiguous axial images were obtained from the base of the skull through the vertex without intravenous contrast. COMPARISON:  08/15/2018 FINDINGS: Brain: Chronic ischemic changes are seen within the right cerebellar hemisphere and left occipital lobe, with encephalomalacia and ex vacuo dilatation of the occipital horn of the left lateral ventricle. Small chronic right parietal cortical infarct is again noted and stable. No sign of acute infarct or hemorrhage. The lateral ventricles and midline structures are stable. No acute extra-axial fluid collections. No mass effect. Vascular: No hyperdense vessel or unexpected calcification. Skull: Normal. Negative for fracture or focal lesion. Sinuses/Orbits: No acute finding. Other: None. IMPRESSION: 1. Stable chronic ischemic changes involving the right cerebellar hemisphere, left occipital lobe, and right parietal cortex. 2. No acute infarct or hemorrhage. Electronically Signed  By: Randa Ngo M.D.   On: 08/16/2019 16:52   CT Angio Chest PE W and/or Wo Contrast  Result Date: 08/16/2019 CLINICAL DATA:  Syncope yesterday at dialysis, hypoxia, cough, worsening shortness of breath EXAM: CT ANGIOGRAPHY CHEST WITH CONTRAST TECHNIQUE: Multidetector CT imaging of the chest was performed using the standard protocol during bolus administration of intravenous contrast. Multiplanar CT image reconstructions and MIPs were obtained to evaluate the vascular anatomy. CONTRAST:  184mL OMNIPAQUE IOHEXOL 350 MG/ML SOLN COMPARISON:  08/16/2019 FINDINGS: Cardiovascular: This is a  technically adequate evaluation of the pulmonary vasculature. No filling defects or pulmonary emboli. The heart is enlarged without pericardial effusion. Normal caliber of the thoracic aorta. There is extensive atherosclerosis within the aortic arch and coronary vasculature. Stable stents within the left subclavian and axillary veins. 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 Mediastinum/Nodes: Lymphadenopathy involving the subcarinal and right paratracheal regions is stable. Trachea and esophagus are unremarkable. The thyroid is normal. Lungs/Pleura: There is bilateral ground-glass airspace disease, with denser consolidation in the right lower lobe. No effusion or pneumothorax. Central airways are patent. Upper Abdomen: Reflux of contrast into the hepatic veins may suggest cardiac dysfunction. No other upper abdominal findings. 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 No acute or destructive bony abnormalities. There are numerous venous collaterals within the right chest wall consistent with chronic right brachiocephalic venous occlusion. Reconstructed images demonstrate no additional findings. Review of the MIP images confirms the above findings. IMPRESSION: 1. No evidence of pulmonary embolus. 2. Bilateral ground-glass airspace disease, with denser consolidation in the right lower lobe. I would favor congestive heart failure given the corresponding findings of cardiomegaly and reflux of contrast into the hepatic veins. 3. Stable nonspecific mediastinal lymphadenopathy. 4. 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 5.  Aortic Atherosclerosis (ICD10-I70.0). Electronically Signed   By: Randa Ngo M.D.   On: 08/16/2019 16:57   DG Chest Port 1 View  Result Date: 08/16/2019 CLINICAL DATA:  Cough. EXAM: PORTABLE CHEST 1 VIEW COMPARISON:  09/09/2018 and 08/23/2018 FINDINGS: Lungs are hypoinflated and  demonstrate hazy opacification over the right mid to lower lung which is not significantly changed compared to the previous exams and may be due to mild chronic prominence of the bronchovascular markings are mild interstitial disease. No acute lobar consolidation or effusion. Stable cardiomegaly. Left subclavian stent unchanged. Remainder of the exam is unchanged. IMPRESSION: 1. No acute findings. Chronic hazy opacification over the right mid to lower. 2.  Cardiomegaly. Electronically Signed   By: Marin Olp M.D.   On: 08/16/2019 11:17   EEG adult  Result Date: 08/17/2019 Lora Havens, MD     08/17/2019  8:58 AM Patient Name: Tenoch Mcclure MRN: [id number] Epilepsy Attending: Lora Havens Referring Physician/Provider: Dr. Karena Addison Aroor Date: 08/16/2019 Duration: 21.08 minutes Patient history: 54 year old male with staring episodes concerning for seizures.  Also had generalized tonic-clonic seizure-like episode.  EEG to evaluate for seizures. Level of alertness: Awake AEDs during EEG study: Keppra Technical aspects: This EEG study was done with scalp electrodes positioned according to the 10-20 International system of electrode placement. Electrical activity was acquired at a sampling rate of 500Hz  and reviewed with a high frequency filter of 70Hz  and a low frequency filter of 1Hz . EEG data were recorded continuously and digitally stored. Description: During awake state, no clear posterior dominant rhythm was seen.  EEG showed continuous generalized polymorphic mixed frequencies with predominantly 6 to 8 Hz theta-alpha  activity as well as intermittent 2 to 3 Hz delta activity.  Hyperventilation and photic stimulation were not performed.   ABNORMALITY -Continuous slow, generalized IMPRESSION: This study is suggestive of mild diffuse encephalopathy, nonspecific etiology. No seizures or epileptiform discharges were seen throughout the recording. Priyanka Barbra Sarks   Overnight EEG with video  Result  Date: 08/17/2019 Lora Havens, MD     08/18/2019  8:31 AM Patient Name: Louay Myrie MRN: [id number] Epilepsy Attending: Lora Havens Referring Physician/Provider: Dr. Karena Addison Aroor Duration: 08/16/2019 1701 to 08/17/2019 1701  Patient history: 54 year old male with staring episodes concerning for seizures.  Also had generalized tonic-clonic seizure-like episode.  EEG to evaluate for seizures.  Level of alertness: Awake, asleep  AEDs during EEG study: Keppra  Technical aspects: This EEG study was done with scalp electrodes positioned according to the 10-20 International system of electrode placement. Electrical activity was acquired at a sampling rate of 500Hz  and reviewed with a high frequency filter of 70Hz  and a low frequency filter of 1Hz . EEG data were recorded continuously and digitally stored.  Description: The posterior dominant rhythm consists of 9-10 Hz activity of moderate voltage (25-35 uV) seen predominantly in posterior head regions, symmetric and reactive to eye opening and eye closing. Sleep was characterized by vertex waves, sleep spindles (12 to 14 Hz), maximal frontocentral region and posterior occipital sharp transients of sleep.  Hyperventilation and photic stimulation were not performed.   1 event was recorded at 1:32 on 08/17/2019.  Patient was laying in bed watching TV.  When nurse walked in and asked patient a question, he initially did not respond and was noted to have brief nonrhythmic jerking movement of bilateral upper extremity.  He eventually responded to the nurses question and asked if this event is being captured.  Concomitant EEG review before, during and after the event did not show any EEG change to suggest seizure.  IMPRESSION: This study is within normal limits. No seizures or epileptiform discharges were seen throughout the recording. One episode of decreased responsiveness was recorded as described above around 1:32 AM on 08/17/2019 without concomitant EEG  change and was a nonepileptic event. Lora Havens   ECHOCARDIOGRAM COMPLETE  Result Date: 08/17/2019    ECHOCARDIOGRAM REPORT   Patient Name:   CALUB TARNOW Date of Exam: 08/17/2019 Medical Rec #:  [id number]         Height:       72.0 in Accession #:    [id number]        Weight:       203.9 lb Date of Birth:  09-04-1965         BSA:          2.148 m Patient Age:    13 years          BP:           134/81 mmHg Patient Gender: M                 HR:           80 bpm. Exam Location:  Inpatient Procedure: 2D Echo, Cardiac Doppler, Color Doppler and Intracardiac            Opacification Agent Indications:    CHF-Acute Diastolic  History:        Patient has prior history of Echocardiogram examinations, most                 recent 09/05/2018. Risk Factors:Diabetes, Sleep Apnea and  Hypertension. ESRD.  Sonographer:    Clayton Lefort RDCS (AE) Referring Phys: [id number]are[address] Mackintosh [facility]  Sonographer Comments: Suboptimal subcostal window. Image acquisition challenging due to patient body habitus. IMPRESSIONS  1. Left ventricular ejection fraction, by estimation, is 25 to 30%. The left ventricle has severely decreased function. The left ventricle demonstrates global hypokinesis with regional variation and spares the apex. There is severe left ventricular hypertrophy. Left ventricular diastolic parameters are consistent with Grade I diastolic dysfunction (impaired relaxation). Elevated left ventricular end-diastolic pressure.  2. Right ventricular systolic function is moderately reduced. The right ventricular size is mildly enlarged.  3. Left atrial size was mildly dilated.  4. Right atrial size was severely dilated.  5. The mitral valve is abnormal. Trivial mitral valve regurgitation.  6. Tricuspid valve regurgitation is mild to moderate.  7. The aortic valve is tricuspid. Aortic valve regurgitation is not visualized. Mild aortic valve sclerosis is present, with no evidence of aortic valve stenosis.  8. Aortic  dilatation noted. There is borderline dilatation of the ascending aorta measuring 39 mm. Comparison(s): Changes from prior study are noted. 09/05/18: 60-65%, mild LVH. Dr. Candiss Norse notified of acute changes. FINDINGS  Left Ventricle: Left ventricular ejection fraction, by estimation, is 25 to 30%. The left ventricle has severely decreased function. The left ventricle demonstrates global hypokinesis. Definity contrast agent was given IV to delineate the left ventricular endocardial borders. The left ventricular internal cavity size was normal in size. There is severe left ventricular hypertrophy. Left ventricular diastolic parameters are consistent with Grade I diastolic dysfunction (impaired relaxation). Elevated left ventricular end-diastolic pressure. Right Ventricle: The right ventricular size is mildly enlarged. No increase in right ventricular wall thickness. Right ventricular systolic function is moderately reduced. Left Atrium: Left atrial size was mildly dilated. Right Atrium: Right atrial size was severely dilated. Pericardium: Trivial pericardial effusion is present. The pericardial effusion is circumferential. Mitral Valve: The mitral valve is abnormal. There is mild thickening of the mitral valve leaflet(s). Mild to moderate mitral annular calcification. Trivial mitral valve regurgitation. MV peak gradient, 3.9 mmHg. The mean mitral valve gradient is 2.0 mmHg. Tricuspid Valve: The tricuspid valve is grossly normal. Tricuspid valve regurgitation is mild to moderate. Aortic Valve: The aortic valve is tricuspid. Aortic valve regurgitation is not visualized. Mild aortic valve sclerosis is present, with no evidence of aortic valve stenosis. Pulmonic Valve: The pulmonic valve was normal in structure. Pulmonic valve regurgitation is trivial. Aorta: Aortic dilatation noted. There is borderline dilatation of the ascending aorta measuring 39 mm. Venous: The inferior vena cava was not well visualized. IAS/Shunts: There  is left bowing of the interatrial septum, suggestive of elevated right atrial pressure. No atrial level shunt detected by color flow Doppler.  LEFT VENTRICLE PLAX 2D LVIDd:         4.64 cm  Diastology LVIDs:         4.04 cm  LV e' lateral:   4.31 cm/s LV PW:         1.80 cm  LV E/e' lateral: 18.6 LV IVS:        1.99 cm  LV e' medial:    2.44 cm/s LVOT diam:     2.20 cm  LV E/e' medial:  32.8 LV SV:         29 LV SV Index:   13 LVOT Area:     3.80 cm  RIGHT VENTRICLE RV Basal diam:  3.92 cm RV Mid diam:    3.71 cm RV S prime:  5.12 cm/s TAPSE (M-mode): 1.1 cm LEFT ATRIUM              Index       RIGHT ATRIUM           Index LA diam:        3.40 cm  1.58 cm/m  RA Area:     29.00 cm LA Vol (A2C):   103.0 ml 47.95 ml/m RA Volume:   105.00 ml 48.88 ml/m LA Vol (A4C):   53.5 ml  24.90 ml/m LA Biplane Vol: 80.5 ml  37.47 ml/m  AORTIC VALVE AV Area (Vmax):    1.92 cm AV Area (Vmean):   1.69 cm AV Area (VTI):     1.62 cm AV Vmax:           96.30 cm/s AV Vmean:          72.600 cm/s AV VTI:            0.178 m AV Peak Grad:      3.7 mmHg AV Mean Grad:      2.0 mmHg LVOT Vmax:         48.70 cm/s LVOT Vmean:        32.200 cm/s LVOT VTI:          0.076 m LVOT/AV VTI ratio: 0.43  AORTA Ao Root diam: 3.70 cm Ao Asc diam:  3.90 cm MITRAL VALVE               TRICUSPID VALVE MV Area (PHT): 4.89 cm    TR Peak grad:   30.5 mmHg MV Peak grad:  3.9 mmHg    TR Vmax:        276.00 cm/s MV Mean grad:  2.0 mmHg MV Vmax:       0.99 m/s    SHUNTS MV Vmean:      57.4 cm/s   Systemic VTI:  0.08 m MV Decel Time: 155 msec    Systemic Diam: 2.20 cm MV E velocity: 80.10 cm/s MV A velocity: 91.30 cm/s MV E/A ratio:  0.88 Lyman Bishop MD Electronically signed by Lyman Bishop MD Signature Date/Time: 08/17/2019/5:52:07 PM    Final      Medications:    . Chlorhexidine Gluconate Cloth  6 each Topical Q0600  . cinacalcet  60 mg Oral Q M,W,F-HD  . docusate sodium  100 mg Oral BID  . doxercalciferol  4 mcg Intravenous Q M,W,F-HD  .  heparin  5,000 Units Subcutaneous Q8H  . insulin aspart  0-9 Units Subcutaneous Q6H  . latanoprost  1 drop Both Eyes QHS  . levETIRAcetam  500 mg Oral QHS  . metoCLOPramide  5 mg Oral BID AC  . multivitamin  1 tablet Oral QHS  . pantoprazole  40 mg Oral Daily  . sevelamer carbonate  2,400 mg Oral TID WC  . sodium chloride flush  3 mL Intravenous Q12H  . timolol  1 drop Both Eyes BID   acetaminophen **OR** acetaminophen, bisacodyl, hydrALAZINE, LORazepam, ondansetron **OR** ondansetron (ZOFRAN) IV  Assessment/ Plan:  Dialysis Orders: AF MWF 4 hrs 15 min 180NRe 450/Autoflow 2.0 90 Kg 2.0K/2.25 Ca UFP 2 AVG -Heparin 6000 units IV initial bolus Heparin 3000 units IV mid run -Sensipar 60 mg po TIW -Hectorol 4 mcg IV TIW  1.  AMS with multiple seizure-like episodes. Per primary/Neurology.  EEG no seizure activities.  Patient remains on [place] 2.  ESRD -  MWF. HD today on schedule. K+ 5.3.  Patient did  not receive dialysis 08/17/2019 due to high dialysis volumes.  They are scheduled for dialysis 08/18/2019 3.  Hypertension/volume  - BP variable, actually on low side for him. No evidence of volume overload by exam. UF as tolerated 2.5-3 liters.  4.  Anemia  -HGB 16. Changed from Auryxia binders to renvela at OP center.  5.  Metabolic bone disease -  Ca 8.7 C Ca 9.3 PO4 8.4. 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 Continue binders/VDRA. 6.  Nutrition - Renal/Carb mod diet. Albumin 3.2. Add nepro/renal vits 7. DM-per primary 8.   HFpEF/CAD    LOS: 2 Sherril Croon @TODAY @8 :34 AM

## 2019-08-19 DIAGNOSIS — R0902 Hypoxemia: Secondary | ICD-10-CM

## 2019-08-19 DIAGNOSIS — Z8673 Personal history of transient ischemic attack (TIA), and cerebral infarction without residual deficits: Secondary | ICD-10-CM

## 2019-08-19 LAB — GLUCOSE, CAPILLARY: Glucose-Capillary: 228 mg/dL — ABNORMAL HIGH (ref 70–99)

## 2019-08-19 MED ORDER — SODIUM CHLORIDE 0.9 % IV SOLN: 100.0000 mL | INTRAVENOUS | Status: DC | PRN

## 2019-08-19 MED ORDER — CHLORHEXIDINE GLUCONATE CLOTH 2 % EX PADS: 6.0000 | MEDICATED_PAD | Freq: Every day | CUTANEOUS | Status: DC

## 2019-08-19 MED ORDER — PENTAFLUOROPROP-TETRAFLUOROETH EX AERO: 1.0000 "application " | INHALATION_SPRAY | CUTANEOUS | Status: DC | PRN

## 2019-08-19 MED ORDER — LIDOCAINE-PRILOCAINE 2.5-2.5 % EX CREA
1.0000 "application " | TOPICAL_CREAM | CUTANEOUS | Status: DC | PRN
  Filled 2019-08-19: qty 5

## 2019-08-19 MED ORDER — ALTEPLASE 2 MG IJ SOLR: 2.0000 mg | Freq: Once | INTRAMUSCULAR | Status: DC | PRN

## 2019-08-19 MED ORDER — LIDOCAINE HCL (PF) 1 % IJ SOLN: 5.0000 mL | INTRAMUSCULAR | Status: DC | PRN

## 2019-08-19 MED ORDER — HEPARIN SODIUM (PORCINE) 1000 UNIT/ML DIALYSIS: 1000.0000 [IU] | INTRAMUSCULAR | Status: DC | PRN

## 2019-08-19 MED ORDER — CARVEDILOL 6.25 MG PO TABS: 6.2500 mg | ORAL_TABLET | Freq: Two times a day (BID) | ORAL | 0 refills | Status: DC

## 2019-08-19 MED ORDER — LEVETIRACETAM 500 MG PO TABS: 500.0000 mg | ORAL_TABLET | Freq: Every day | ORAL | 0 refills | Status: AC

## 2019-08-19 MED ORDER — BIDIL 20-37.5 MG PO TABS
1.0000 | ORAL_TABLET | Freq: Three times a day (TID) | ORAL | 0 refills | Status: AC
Start: 2019-08-19 — End: ?

## 2019-08-19 NOTE — Discharge Summary (Addendum)
Howard Bell JGO:[id number] DOB: June 03, 1965 DOA: 08/16/2019  PCP: Howard Pollen, MD  Admit date: 08/16/2019  Discharge date: 08/19/2019  Admitted From: Home  Disposition:  Home   Recommendations for Outpatient Follow-up:   Follow up with PCP in 1-2 weeks  PCP Please obtain BMP/CBC, 2 view CXR in 1week,  (see Discharge instructions)   PCP Please follow up on the following pending results: Needs make sure he follows with neurology and cardiology within 1 to 2 weeks.   Home Health: None  Equipment/Devices: None  Consultations: Neurology, cardiology, nephrology Discharge Condition: Stable   CODE STATUS: Full    Diet Recommendation: Low carbohydrate-renal diet with 1.2 L fluid restriction per day  Diet Order            Diet renal/carb modified with fluid restriction Diet-HS Snack? Nothing; Fluid restriction: 1200 mL Fluid; Room service appropriate? Yes; Fluid consistency: Thin  Diet effective now               Chief Complaint  Patient presents with  . 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                                                                 Hospital Course      1.AMS ongoing with Multiple seizure-like episodes. In a patient with history of seizures who was on Dilantin previously which was stopped several years ago - CTA head nonacute with no stroke, prolonged EEG did not pick up any seizure-like activity, however he has shown clinical improvement on IV Keppra which will be continued in oral form, MRI brain remains nonacute, his mentation has improved considerably and now close to baseline will be discharged home with outpatient PCP and neurology follow-up on oral Keppra, he does not drive at this time however return instructions have been provided.  2.ESRD. On MWF schedule, finished dialysis yesterday confirmed by patient and wife, nephrology has been consulted .  3.Question of patient being slightly hypoxic upon arrival .  Likely false reading due to poor circulation in extremities, comfortable on 2 L, no acute issues we will continue to monitor.   4. Combined chronic systolic and diastolic heart failure, EF was 65% 1  year ago and now 25%.   Currently appears close to compensated, any extra fluid being removed via dialysis, blood pressure has stabilized will put him on combination of Coreg, hydralazine and Imdur, potassium runs quite high hence he is a poor candidate for ACE/ARB or Entresto.  Seen by cardiology, poor candidate for intervention will have him follow with cardiology in the outpatient setting post discharge.  Mild rise in troponin at the time of admission was in non-ACS pattern with nonacute EKG, no chest pain due to underlying ESRD.  Trend was flat.  5.Hypertension.   Blood pressure stabilized see #4 above.  6.History of bilateral BKA. Supportive care.  7.  Obesity with OSA.  Follow with PCP for weight loss, CPAP nightly which he started using only 3 to 4 days prior to hospital admission.    8. DM type II. Continue home regimen unchanged.  Due to his ESRD is glycemic control is slightly more relaxed, will  defer to PCP to monitor and adjust his insulin regimen as desired.     Discharge diagnosis     Active Problems:   Seizures ([place])   Acute on chronic combined systolic and diastolic CHF (congestive heart failure) [facility])    Discharge instructions    Discharge Instructions    Discharge instructions   Complete by: As directed    Do not drive, operate heavy machinery, perform activities at heights, swimming or participation in water activities or provide baby sitting services until you have seen by Primary MD or a Neurologist and advised to do so again.  Follow with Primary MD Howard Pollen, MD in 7 days   Get CBC, CMP, 2 view Chest X ray -  checked next visit within 1 week by Primary MD    Activity: As tolerated with Full fall precautions use walker/cane & assistance as needed  Disposition Home   Diet: Low carbohydrate renal diet -1.2 L fluid fluid restriction per day, check CBGs QA [facility]  Special Instructions: If you have smoked or chewed Tobacco  in the last 2 yrs please stop smoking, stop any regular Alcohol  and or any Recreational drug use.  On your next visit with your primary care physician please Get Medicines reviewed and adjusted.  Please request your Prim.MD to go over all Hospital Tests and Procedure/Radiological results at the follow up, please get all Hospital records sent to your Prim MD by signing hospital release before you go home.  If you experience worsening of your admission symptoms, develop shortness of breath, life threatening emergency, suicidal or homicidal thoughts you must seek medical attention immediately by calling 911 or calling your MD immediately  if symptoms less severe.  You Must read complete instructions/literature along with all the possible adverse reactions/side effects for all the Medicines you take and that have been prescribed to you. Take any new Medicines after you have completely understood and accpet all the possible adverse  reactions/side effects.   Increase activity slowly   Complete by: As directed       Discharge Medications   Allergies as of 08/19/2019      Reactions   Gabapentin Other (See Comments)   Altered mental status      Medication List    STOP taking these medications   amLODipine 10 MG tablet Commonly known as: NORVASC   metoprolol succinate 50 MG 24 hr tablet Commonly known as: TOPROL-XL     TAKE these medications   accu-chek soft touch lancets Use as instructed  acetaminophen 325 MG tablet Commonly known as: TYLENOL Take 1-2 tablets (325-650 mg total) by mouth every 4 (four) hours as needed for mild pain.   albuterol 108 (90 Base) MCG/ACT inhaler Commonly known as: VENTOLIN HFA Inhale 1 puff into the lungs every 4 (four) hours as needed for wheezing or shortness of breath.   BiDil 20-37.5 MG tablet Generic drug: isosorbide-hydrALAZINE Take 1 tablet by mouth 3 (three) times daily.   carvedilol 6.25 MG tablet Commonly known as: COREG Take 1 tablet (6.25 mg total) by mouth 2 (two) times daily with a meal.   docusate sodium 100 MG capsule Commonly known as: COLACE Take 1 capsule (100 mg total) by mouth 2 (two) times daily.   ferric citrate 1 GM 210 MG(Fe) tablet Commonly known as: AURYXIA Take 1 tablet (210 mg total) by mouth 3 (three) times daily with meals.   fluticasone 50 MCG/ACT nasal spray Commonly known as: FLONASE Place 1 spray into both nostrils daily as needed for allergies.   glucose blood test strip 1 each by Other route as needed for other. Use as instructed   insulin lispro 100 UNIT/ML KwikPen Commonly known as: Admelog SoloStar 3 times a day (just before each meal), 6-6-8 units, and pen needles 3/day What changed:   how much to take  how to take this  when to take this  additional instructions   latanoprost 0.005 % ophthalmic solution Commonly known as: XALATAN Place 1 drop into both eyes at bedtime.   levETIRAcetam 500 MG  tablet Commonly known as: KEPPRA Take 1 tablet (500 mg total) by mouth at bedtime.   metoCLOPramide 5 MG tablet Commonly known as: Reglan Take 1 tablet (5 mg total) by mouth 3 (three) times daily before meals. What changed: when to take this   multivitamin Tabs tablet Take 1 tablet by mouth at bedtime.   omeprazole 20 MG capsule Commonly known as: PRILOSEC Take 1 capsule (20 mg total) by mouth at bedtime.   timolol 0.5 % ophthalmic solution Commonly known as: BETIMOL Place 1 drop into both eyes 2 (two) times daily.       Follow-up Information    Howard Pollen, MD. Schedule an appointment as soon as possible for a visit in 1 week(s).   Specialty: Internal Medicine Contact information: [place] [address] [phone]        Pixie Casino, MD. Schedule an appointment as soon as possible for a visit in 1 week(s).   Specialty: Cardiology Why: CHF Contact information: [address] [place] [address] [phone]        [place]. Schedule an appointment as soon as possible for a visit in 1 week(s).   Why: Seizures Contact information: [address]     [place] [address] [phone]          Major procedures and Radiology Reports - PLEASE review detailed and final reports thoroughly  -        CT Head Wo Contrast  Result Date: 08/16/2019 CLINICAL DATA:  Syncope at dialysis yesterday, cough EXAM: CT HEAD WITHOUT CONTRAST TECHNIQUE: Contiguous axial images were obtained from the base of the skull through the vertex without intravenous contrast. COMPARISON:  08/15/2018 FINDINGS: Brain: Chronic ischemic changes are seen within the right cerebellar hemisphere and left occipital lobe, with encephalomalacia and ex vacuo dilatation of the occipital horn of the left lateral ventricle. Small chronic right parietal cortical infarct is again noted and stable.  No sign of acute  infarct or hemorrhage. The lateral ventricles and midline structures are stable. No acute extra-axial fluid collections. No mass effect. Vascular: No hyperdense vessel or unexpected calcification. Skull: Normal. Negative for fracture or focal lesion. Sinuses/Orbits: No acute finding. Other: None. IMPRESSION: 1. Stable chronic ischemic changes involving the right cerebellar hemisphere, left occipital lobe, and right parietal cortex. 2. No acute infarct or hemorrhage. Electronically Signed   By: Randa Ngo M.D.   On: 08/16/2019 16:52   CT Angio Chest PE W and/or Wo Contrast  Result Date: 08/16/2019 CLINICAL DATA:  Syncope yesterday at dialysis, hypoxia, cough, worsening shortness of breath EXAM: CT ANGIOGRAPHY CHEST WITH CONTRAST TECHNIQUE: Multidetector CT imaging of the chest was performed using the standard protocol during bolus administration of intravenous contrast. Multiplanar CT image reconstructions and MIPs were obtained to evaluate the vascular anatomy. CONTRAST:  146mL OMNIPAQUE IOHEXOL 350 MG/ML SOLN COMPARISON:  08/16/2019 FINDINGS: Cardiovascular: This is a technically adequate evaluation of the pulmonary vasculature. No filling defects or pulmonary emboli. The heart is enlarged without pericardial effusion. Normal caliber of the thoracic aorta. There is extensive atherosclerosis within the aortic arch and coronary vasculature. Stable stents within the left subclavian and axillary veins. 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 Mediastinum/Nodes: Lymphadenopathy involving the subcarinal and right paratracheal regions is stable. Trachea and esophagus are unremarkable. The thyroid is normal. Lungs/Pleura: There is bilateral ground-glass airspace disease, with denser consolidation in the right lower lobe. No effusion or pneumothorax. Central airways are patent. Upper Abdomen: Reflux of contrast into the hepatic veins may suggest cardiac dysfunction. No other upper abdominal findings.  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 No acute or destructive bony abnormalities. There are numerous venous collaterals within the right chest wall consistent with chronic right brachiocephalic venous occlusion. Reconstructed images demonstrate no additional findings. Review of the MIP images confirms the above findings. IMPRESSION: 1. No evidence of pulmonary embolus. 2. Bilateral ground-glass airspace disease, with denser consolidation in the right lower lobe. I would favor congestive heart failure Bell the corresponding findings of cardiomegaly and reflux of contrast into the hepatic veins. 3. Stable nonspecific mediastinal lymphadenopathy. 4. 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 5.  Aortic Atherosclerosis (ICD10-I70.0). Electronically Signed   By: Randa Ngo M.D.   On: 08/16/2019 16:57   MR BRAIN WO CONTRAST  Result Date: 08/18/2019 CLINICAL DATA:  Seizure.  Dialysis patient. EXAM: MRI HEAD WITHOUT CONTRAST TECHNIQUE: Multiplanar, multiecho pulse sequences of the brain and surrounding structures were obtained without intravenous contrast. COMPARISON:  CT head 08/16/2019 FINDINGS: Brain: Negative for acute infarct.  Negative for hemorrhage or mass Mild atrophy. Chronic infarct right cerebellum. Chronic infarct left occipital lobe. Calcifications in the cerebellar vermis and cerebellar hemispheres bilaterally. Chronic microvascular ischemic change in the white matter. Small chronic infarct right parietal lobe. Small chronic infarct left cerebellum. Vascular: Normal arterial flow voids Skull and upper cervical spine: No focal skeletal abnormality. Sinuses/Orbits: Paranasal sinuses clear. Bilateral cataract extraction. No orbital mass. Other: None IMPRESSION: No acute abnormality Multiple chronic infarcts as above.  No acute infarct or mass. Electronically Signed   By: Franchot Gallo M.D.   On:  08/18/2019 20:29   DG Chest Port 1 View  Result Date: 08/16/2019 CLINICAL DATA:  Cough. EXAM: PORTABLE CHEST 1 VIEW COMPARISON:  09/09/2018 and 08/23/2018 FINDINGS: Lungs are hypoinflated and demonstrate hazy opacification over the right mid to lower lung which is not significantly changed compared  to the previous exams and may be due to mild chronic prominence of the bronchovascular markings are mild interstitial disease. No acute lobar consolidation or effusion. Stable cardiomegaly. Left subclavian stent unchanged. Remainder of the exam is unchanged. IMPRESSION: 1. No acute findings. Chronic hazy opacification over the right mid to lower. 2.  Cardiomegaly. Electronically Signed   By: Marin Olp M.D.   On: 08/16/2019 11:17   EEG adult  Result Date: 08/17/2019 Lora Havens, MD     08/17/2019  8:58 AM Patient Name: Howard Bell MRN: [id number] Epilepsy Attending: Lora Havens Referring Physician/Provider: Dr. Karena Addison Aroor Date: 08/16/2019 Duration: 21.08 minutes Patient history: 54 year old male with staring episodes concerning for seizures.  Also had generalized tonic-clonic seizure-like episode.  EEG to evaluate for seizures. Level of alertness: Awake AEDs during EEG study: Keppra Technical aspects: This EEG study was done with scalp electrodes positioned according to the 10-20 International system of electrode placement. Electrical activity was acquired at a sampling rate of 500Hz  and reviewed with a high frequency filter of 70Hz  and a low frequency filter of 1Hz . EEG data were recorded continuously and digitally stored. Description: During awake state, no clear posterior dominant rhythm was seen.  EEG showed continuous generalized polymorphic mixed frequencies with predominantly 6 to 8 Hz theta-alpha activity as well as intermittent 2 to 3 Hz delta activity.  Hyperventilation and photic stimulation were not performed.   ABNORMALITY -Continuous slow, generalized IMPRESSION: This study is  suggestive of mild diffuse encephalopathy, nonspecific etiology. No seizures or epileptiform discharges were seen throughout the recording. Howard Bell   Overnight EEG with video  Result Date: 08/17/2019 Lora Havens, MD     08/18/2019  8:31 AM Patient Name: Howard Bell MRN: [id number] Epilepsy Attending: Lora Havens Referring Physician/Provider: Dr. Karena Addison Aroor Duration: 08/16/2019 1701 to 08/17/2019 1701  Patient history: 54 year old male with staring episodes concerning for seizures.  Also had generalized tonic-clonic seizure-like episode.  EEG to evaluate for seizures.  Level of alertness: Awake, asleep  AEDs during EEG study: Keppra  Technical aspects: This EEG study was done with scalp electrodes positioned according to the 10-20 International system of electrode placement. Electrical activity was acquired at a sampling rate of 500Hz  and reviewed with a high frequency filter of 70Hz  and a low frequency filter of 1Hz . EEG data were recorded continuously and digitally stored.  Description: The posterior dominant rhythm consists of 9-10 Hz activity of moderate voltage (25-35 uV) seen predominantly in posterior head regions, symmetric and reactive to eye opening and eye closing. Sleep was characterized by vertex waves, sleep spindles (12 to 14 Hz), maximal frontocentral region and posterior occipital sharp transients of sleep.  Hyperventilation and photic stimulation were not performed.   1 event was recorded at 1:32 on 08/17/2019.  Patient was laying in bed watching TV.  When nurse walked in and asked patient a question, he initially did not respond and was noted to have brief nonrhythmic jerking movement of bilateral upper extremity.  He eventually responded to the nurses question and asked if this event is being captured.  Concomitant EEG review before, during and after the event did not show any EEG change to suggest seizure.  IMPRESSION: This study is within normal limits. No  seizures or epileptiform discharges were seen throughout the recording. One episode of decreased responsiveness was recorded as described above around 1:32 AM on 08/17/2019 without concomitant EEG change and was a nonepileptic event. Howard Bell   ECHOCARDIOGRAM COMPLETE  Result Date: 08/17/2019    ECHOCARDIOGRAM REPORT   Patient Name:   Howard Bell Date of Exam: 08/17/2019 Medical Rec #:  [id number]         Height:       72.0 in Accession #:    [id number]        Weight:       203.9 lb Date of Birth:  07/06/1965         BSA:          2.148 m Patient Age:    71 years          BP:           134/81 mmHg Patient Gender: M                 HR:           80 bpm. Exam Location:  Inpatient Procedure: 2D Echo, Cardiac Doppler, Color Doppler and Intracardiac            Opacification Agent Indications:    CHF-Acute Diastolic  History:        Patient has prior history of Echocardiogram examinations, most                 recent 09/05/2018. Risk Factors:Diabetes, Sleep Apnea and                 Hypertension. ESRD.  Sonographer:    Clayton Lefort RDCS (AE) Referring Phys: [id number] Mackintosh [facility]  Sonographer Comments: Suboptimal subcostal window. Image acquisition challenging due to patient body habitus. IMPRESSIONS  1. Left ventricular ejection fraction, by estimation, is 25 to 30%. The left ventricle has severely decreased function. The left ventricle demonstrates global hypokinesis with regional variation and spares the apex. There is severe left ventricular hypertrophy. Left ventricular diastolic parameters are consistent with Grade I diastolic dysfunction (impaired relaxation). Elevated left ventricular end-diastolic pressure.  2. Right ventricular systolic function is moderately reduced. The right ventricular size is mildly enlarged.  3. Left atrial size was mildly dilated.  4. Right atrial size was severely dilated.  5. The mitral valve is abnormal. Trivial mitral valve regurgitation.  6. Tricuspid valve regurgitation  is mild to moderate.  7. The aortic valve is tricuspid. Aortic valve regurgitation is not visualized. Mild aortic valve sclerosis is present, with no evidence of aortic valve stenosis.  8. Aortic dilatation noted. There is borderline dilatation of the ascending aorta measuring 39 mm. Comparison(s): Changes from prior study are noted. 09/05/18: 60-65%, mild LVH. Dr. Candiss Norse notified of acute changes. FINDINGS  Left Ventricle: Left ventricular ejection fraction, by estimation, is 25 to 30%. The left ventricle has severely decreased function. The left ventricle demonstrates global hypokinesis. Definity contrast agent was Bell IV to delineate the left ventricular endocardial borders. The left ventricular internal cavity size was normal in size. There is severe left ventricular hypertrophy. Left ventricular diastolic parameters are consistent with Grade I diastolic dysfunction (impaired relaxation). Elevated left ventricular end-diastolic pressure. Right Ventricle: The right ventricular size is mildly enlarged. No increase in right ventricular wall thickness. Right ventricular systolic function is moderately reduced. Left Atrium: Left atrial size was mildly dilated. Right Atrium: Right atrial size was severely dilated. Pericardium: Trivial pericardial effusion is present. The pericardial effusion is circumferential. Mitral Valve: The mitral valve is abnormal. There is mild thickening of the mitral valve leaflet(s). Mild to moderate mitral annular calcification. Trivial mitral valve regurgitation. MV peak gradient, 3.9 mmHg. The mean mitral valve gradient is 2.0  mmHg. Tricuspid Valve: The tricuspid valve is grossly normal. Tricuspid valve regurgitation is mild to moderate. Aortic Valve: The aortic valve is tricuspid. Aortic valve regurgitation is not visualized. Mild aortic valve sclerosis is present, with no evidence of aortic valve stenosis. Pulmonic Valve: The pulmonic valve was normal in structure. Pulmonic valve  regurgitation is trivial. Aorta: Aortic dilatation noted. There is borderline dilatation of the ascending aorta measuring 39 mm. Venous: The inferior vena cava was not well visualized. IAS/Shunts: There is left bowing of the interatrial septum, suggestive of elevated right atrial pressure. No atrial level shunt detected by color flow Doppler.  LEFT VENTRICLE PLAX 2D LVIDd:         4.64 cm  Diastology LVIDs:         4.04 cm  LV e' lateral:   4.31 cm/s LV PW:         1.80 cm  LV E/e' lateral: 18.6 LV IVS:        1.99 cm  LV e' medial:    2.44 cm/s LVOT diam:     2.20 cm  LV E/e' medial:  32.8 LV SV:         29 LV SV Index:   13 LVOT Area:     3.80 cm  RIGHT VENTRICLE RV Basal diam:  3.92 cm RV Mid diam:    3.71 cm RV S prime:     5.12 cm/s TAPSE (M-mode): 1.1 cm LEFT ATRIUM              Index       RIGHT ATRIUM           Index LA diam:        3.40 cm  1.58 cm/m  RA Area:     29.00 cm LA Vol (A2C):   103.0 ml 47.95 ml/m RA Volume:   105.00 ml 48.88 ml/m LA Vol (A4C):   53.5 ml  24.90 ml/m LA Biplane Vol: 80.5 ml  37.47 ml/m  AORTIC VALVE AV Area (Vmax):    1.92 cm AV Area (Vmean):   1.69 cm AV Area (VTI):     1.62 cm AV Vmax:           96.30 cm/s AV Vmean:          72.600 cm/s AV VTI:            0.178 m AV Peak Grad:      3.7 mmHg AV Mean Grad:      2.0 mmHg LVOT Vmax:         48.70 cm/s LVOT Vmean:        32.200 cm/s LVOT VTI:          0.076 m LVOT/AV VTI ratio: 0.43  AORTA Ao Root diam: 3.70 cm Ao Asc diam:  3.90 cm MITRAL VALVE               TRICUSPID VALVE MV Area (PHT): 4.89 cm    TR Peak grad:   30.5 mmHg MV Peak grad:  3.9 mmHg    TR Vmax:        276.00 cm/s MV Mean grad:  2.0 mmHg MV Vmax:       0.99 m/s    SHUNTS MV Vmean:      57.4 cm/s   Systemic VTI:  0.08 m MV Decel Time: 155 msec    Systemic Diam: 2.20 cm MV E velocity: 80.10 cm/s MV A velocity: 91.30 cm/s MV E/A ratio:  0.88 Chrissie Noa  Hilty MD Electronically signed by Lyman Bishop MD Signature Date/Time: 08/17/2019/5:52:07 PM    Final      Micro Results    Recent Results (from the past 240 hour(s))  SARS Coronavirus 2 by RT PCR (hospital order, performed in [facility] hospital lab) Nasopharyngeal Nasopharyngeal Swab     Status: None   Collection Time: 08/16/19 11:44 AM   Specimen: Nasopharyngeal Swab  Result Value Ref Range Status   SARS Coronavirus 2 NEGATIVE NEGATIVE Final    Comment: (NOTE) SARS-CoV-2 target nucleic acids are NOT DETECTED. The SARS-CoV-2 RNA is generally detectable in upper and lower respiratory specimens during the acute phase of infection. The lowest concentration of SARS-CoV-2 viral copies this assay can detect is 250 copies / mL. A negative result does not preclude SARS-CoV-2 infection and should not be used as the sole basis for treatment or other patient management decisions.  A negative result may occur with improper specimen collection / handling, submission of specimen other than nasopharyngeal swab, presence of viral mutation(s) within the areas targeted by this assay, and inadequate number of viral copies (<250 copies / mL). A negative result must be combined with clinical observations, patient history, and epidemiological information. Fact Sheet for Patients:   StrictlyIdeas.no Fact Sheet for Healthcare Providers: BankingDealers.co.za This test is not yet approved or cleared  by the [place] FDA and has been authorized for detection and/or diagnosis of SARS-CoV-2 by FDA under an Emergency Use Authorization (EUA).  This EUA will remain in effect (meaning this test can be used) for the duration of the COVID-19 declaration under Section 564(b)(1) of the Act, 21 U.S.C. section 360bbb-3(b)(1), unless the authorization is terminated or revoked sooner. Performed at [facility], [place] [address] [place], [place] [address]   MRSA PCR Screening     Status: None   Collection Time: 08/17/19  3:08 PM   Specimen: Nasal Mucosa;  Nasopharyngeal  Result Value Ref Range Status   MRSA by PCR NEGATIVE NEGATIVE Final    Comment:        The GeneXpert MRSA Assay (FDA approved for NASAL specimens only), is one component of a comprehensive MRSA colonization surveillance program. It is not intended to diagnose MRSA infection nor to guide or monitor treatment for MRSA infections. Performed at [facility], [place] [address] [place], [place] [address]     Today   Subjective    Howard Bell today has no headache,no chest abdominal pain,no new weakness tingling or numbness, feels much better wants to go home today.    Objective   Blood pressure 118/69, pulse 93, temperature 98.6 F (37 C), temperature source Oral, resp. rate 14, height 6' (1.829 m), weight 97.2 kg, SpO2 97 %.   Intake/Output Summary (Last 24 hours) at 08/19/2019 0901 Last data filed at 08/19/2019 0200 Gross per 24 hour  Intake 490 ml  Output 2871 ml  Net -2381 ml    Exam  Awake Alert, No new F.N deficits, Normal affect [place].AT,PERRAL Supple Neck,No JVD, No cervical lymphadenopathy appriciated.  Symmetrical Chest wall movement, Good air movement bilaterally, CTAB RRR,No Gallops,Rubs or new Murmurs, No Parasternal Heave +ve B.Sounds, Abd Soft, Non tender, No organomegaly appriciated, No rebound -guarding or rigidity. No Cyanosis, Clubbing or edema, No new Rash or bruise   Data Review   CBC w Diff:  Lab Results  Component Value Date   WBC 6.7 08/18/2019   HGB 15.1 08/18/2019   HGB 14.2 09/20/2017   HCT 45.7 08/18/2019  HCT 42.3 09/20/2017   PLT 151 08/18/2019   PLT 162 09/20/2017   LYMPHOPCT 25 09/13/2018   MONOPCT 15 09/13/2018   EOSPCT 6 09/13/2018   BASOPCT 1 09/13/2018    CMP:  Lab Results  Component Value Date   NA 134 (L) 08/18/2019   NA 138 09/20/2017   K 3.6 08/18/2019   CL 91 (L) 08/18/2019   CO2 27 08/18/2019   BUN 32 (H) 08/18/2019   BUN 41 (H) 09/20/2017   CREATININE 8.89 (H) 08/18/2019   PROT 7.3  08/17/2019   PROT 7.6 09/20/2017   ALBUMIN 3.5 08/18/2019   ALBUMIN 4.4 09/20/2017   BILITOT 1.1 08/17/2019   BILITOT 0.4 09/20/2017   ALKPHOS 77 08/17/2019   AST 25 08/17/2019   ALT 27 08/17/2019  .   Total Time in preparing paper work, data evaluation and todays exam - 43 minutes  Lala Lund M.D on 08/19/2019 at 9:01 AM  [facility]   Office  [phone]

## 2019-08-19 NOTE — Discharge Instructions (Signed)
Do not drive, operate heavy machinery, perform activities at heights, swimming or participation in water activities or provide baby sitting services until you have seen by Primary MD or a Neurologist and advised to do so again.  Follow with Primary MD Horald Pollen, MD in 7 days   Get CBC, CMP, 2 view Chest X ray -  checked next visit within 1 week by Primary MD    Activity: As tolerated with Full fall precautions use walker/cane & assistance as needed  Disposition Home   Diet: Low carbohydrate renal diet -1.2 L fluid fluid restriction per day, check CBGs QA [facility]  Special Instructions: If you have smoked or chewed Tobacco  in the last 2 yrs please stop smoking, stop any regular Alcohol  and or any Recreational drug use.  On your next visit with your primary care physician please Get Medicines reviewed and adjusted.  Please request your Prim.MD to go over all Hospital Tests and Procedure/Radiological results at the follow up, please get all Hospital records sent to your Prim MD by signing hospital release before you go home.  If you experience worsening of your admission symptoms, develop shortness of breath, life threatening emergency, suicidal or homicidal thoughts you must seek medical attention immediately by calling 911 or calling your MD immediately  if symptoms less severe.  You Must read complete instructions/literature along with all the possible adverse reactions/side effects for all the Medicines you take and that have been prescribed to you. Take any new Medicines after you have completely understood and accpet all the possible adverse reactions/side effects.

## 2019-08-19 NOTE — Progress Notes (Signed)
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  He was brought into the emergency room with shaking episodes and episodes of unresponsiveness.  He has been evaluated by neurology.  EEG study has been interpreted as having no epileptiform discharges.  Patient is off schedule due to high dialysis volumes 08/17/2019  Blood pressure 97/49 pulse 96 temperature 98.5 O2 sats 95% 3 L nasal cannula  Sodium 134 potassium 5.3 chloride 91 CO2 30 BUN 41 creatinine 12 glucose 143 phosphorus 8.4 calcium 8.7 albumin 3.2 hemoglobin 15  Cinacalcet 60 mg Monday Wednesday Friday, Hectorol 4 mcg Monday Wednesday Friday, insulin sliding scale, Keppra 500 mg nightly, Reglan 5 mg twice daily, Protonix 40 mg daily, multivitamins 1 daily, Renvela 2.4 g 3 times daily with meals.    Objective:  Vital signs in last 24 hours:  Temp:  [97.7 F (36.5 C)-98.8 F (37.1 C)] 98.5 F (36.9 C) (05/30 0311) Pulse Rate:  [33-96] 33 (05/30 0600) Resp:  [8-25] 12 (05/30 0645) BP: (86-157)/(21-86) 97/49 (05/30 0311) SpO2:  [86 %-99 %] 88 % (05/30 0600) Weight:  [90.1 kg-97.2 kg] 97.2 kg (05/29 1758)  Weight change:  Filed Weights   08/17/19 0600 08/18/19 1306 08/18/19 1758  Weight: 92.5 kg 90.1 kg 97.2 kg    Intake/Output: I/O last 3 completed shifts: In: 1180 [P.O.:1160; I.V.:20] Out: 2871 [Other:2871]   Intake/Output this shift:  No intake/output data recorded.  General: Chronically ill appearing older male in NAD. Head: Normocephalic, atraumatic, sclera anicteric, mucus membranes are moist Neck: Supple. JVD not elevated. Lungs: Clear bilaterally to auscultation without wheezes, rales, or rhonchi. Breathing is unlabored. Heart: RRR with S1 S2. No murmurs, rubs,  or gallops appreciated. Abdomen: Soft, non-tender, non-distended with normoactive bowel sounds. No rebound/guarding. No obvious abdominal masses. Lower extremities: Bilateral AKA no stump edema  Neuro: Alert and oriented X 3. Moves all extremities spontaneously. Psych:  Responds to questions appropriately with a normal affect. Dialysis Access: L AVG + bruit   Basic Metabolic Panel: Recent Labs  Lab 08/16/19 0903 08/16/19 0903 08/16/19 2258 08/17/19 0828 08/17/19 1012 08/18/19 1434  NA 136  --   --  133* 134* 134*  K 4.7  --   --  5.1 5.3* 3.6  CL 90*  --   --  91* 91* 91*  CO2 32  --   --  27 30 27   GLUCOSE 227*  --   --  126* 143* 157*  BUN 29*  --   --  42* 41* 32*  CREATININE 9.84*  --  11.38* 11.86* 12.04* 8.89*  CALCIUM 9.1   < >  --  8.7* 8.7* 9.1  PHOS  --   --   --   --  8.4* 4.9*   < > = values in this interval not displayed.    Liver Function Tests: Recent Labs  Lab 08/17/19 0828 08/17/19 1012 08/18/19 1434  AST 25  --   --   ALT 27  --   --   ALKPHOS 77  --   --   BILITOT 1.1  --   --   PROT 7.3  --   --   ALBUMIN 3.2* 3.2* 3.5   No results for input(s): LIPASE, AMYLASE in the last 168 hours. No results for input(s): AMMONIA in the  last 168 hours.  CBC: Recent Labs  Lab 08/16/19 0903 08/16/19 2258 08/17/19 0407 08/18/19 1434  WBC 5.9 5.4 5.8 6.7  HGB 16.1 15.6 15.0 15.1  HCT 51.0 48.4 45.3 45.7  MCV 98.1 96.8 95.2 93.6  PLT 129* 128* PLATELET CLUMPS NOTED ON SMEAR, UNABLE TO ESTIMATE 151    Cardiac Enzymes: No results for input(s): CKTOTAL, CKMB, CKMBINDEX, TROPONINI in the last 168 hours.  BNP: Invalid input(s): POCBNP  CBG: Recent Labs  Lab 08/18/19 0603 08/18/19 1133 08/18/19 1825 08/18/19 2242 08/19/19 0617  GLUCAP 173* 192* 171* 210* 62*    Microbiology: Results for orders placed or performed during the hospital encounter of 08/16/19  SARS Coronavirus 2 by RT PCR (hospital order, performed in [facility] hospital lab)  Nasopharyngeal Nasopharyngeal Swab     Status: None   Collection Time: 08/16/19 11:44 AM   Specimen: Nasopharyngeal Swab  Result Value Ref Range Status   SARS Coronavirus 2 NEGATIVE NEGATIVE Final    Comment: (NOTE) SARS-CoV-2 target nucleic acids are NOT DETECTED. The SARS-CoV-2 RNA is generally detectable in upper and lower respiratory specimens during the acute phase of infection. The lowest concentration of SARS-CoV-2 viral copies this assay can detect is 250 copies / mL. A negative result does not preclude SARS-CoV-2 infection and should not be used as the sole basis for treatment or other patient management decisions.  A negative result may occur with improper specimen collection / handling, submission of specimen other than nasopharyngeal swab, presence of viral mutation(s) within the areas targeted by this assay, and inadequate number of viral copies (<250 copies / mL). A negative result must be combined with clinical observations, patient history, and epidemiological information. Fact Sheet for Patients:   StrictlyIdeas.no Fact Sheet for Healthcare Providers: BankingDealers.co.za This test is not yet approved or cleared  by the [place] FDA and has been authorized for detection and/or diagnosis of SARS-CoV-2 by FDA under an Emergency Use Authorization (EUA).  This EUA will remain in effect (meaning this test can be used) for the duration of the COVID-19 declaration under Section 564(b)(1) of the Act, 21 U.S.C. section 360bbb-3(b)(1), unless the authorization is terminated or revoked sooner. Performed at [facility], [place] [address], [place], [place] [address]   MRSA PCR Screening     Status: None   Collection Time: 08/17/19  3:08 PM   Specimen: Nasal Mucosa; Nasopharyngeal  Result Value Ref Range Status   MRSA by PCR NEGATIVE NEGATIVE Final    Comment:        The GeneXpert MRSA Assay (FDA approved for NASAL  specimens only), is one component of a comprehensive MRSA colonization surveillance program. It is not intended to diagnose MRSA infection nor to guide or monitor treatment for MRSA infections. Performed at [facility], [place] [address], [place], [place] [address]     Coagulation Studies: No results for input(s): LABPROT, INR in the last 72 hours.  Urinalysis: No results for input(s): COLORURINE, LABSPEC, PHURINE, GLUCOSEU, HGBUR, BILIRUBINUR, KETONESUR, PROTEINUR, UROBILINOGEN, NITRITE, LEUKOCYTESUR in the last 72 hours.  Invalid input(s): APPERANCEUR    Imaging: MR BRAIN WO CONTRAST  Result Date: 08/18/2019 CLINICAL DATA:  Seizure.  Dialysis patient. EXAM: MRI HEAD WITHOUT CONTRAST TECHNIQUE: Multiplanar, multiecho pulse sequences of the brain and surrounding structures were obtained without intravenous contrast. COMPARISON:  CT head 08/16/2019 FINDINGS: Brain: Negative for acute infarct.  Negative for hemorrhage or mass Mild atrophy. Chronic infarct right cerebellum. Chronic infarct left occipital lobe. Calcifications  in the cerebellar vermis and cerebellar hemispheres bilaterally. Chronic microvascular ischemic change in the white matter. Small chronic infarct right parietal lobe. Small chronic infarct left cerebellum. Vascular: Normal arterial flow voids Skull and upper cervical spine: No focal skeletal abnormality. Sinuses/Orbits: Paranasal sinuses clear. Bilateral cataract extraction. No orbital mass. Other: None IMPRESSION: No acute abnormality Multiple chronic infarcts as above.  No acute infarct or mass. Electronically Signed   By: Franchot Gallo M.D.   On: 08/18/2019 20:29   EEG adult  Result Date: 08/17/2019 Lora Havens, MD     08/17/2019  8:58 AM Patient Name: Kyrian Stage MRN: [id number] Epilepsy Attending: Lora Havens Referring Physician/Provider: Dr. Karena Addison Aroor Date: 08/16/2019 Duration: 21.08 minutes Patient history: 54 year old male with staring  episodes concerning for seizures.  Also had generalized tonic-clonic seizure-like episode.  EEG to evaluate for seizures. Level of alertness: Awake AEDs during EEG study: Keppra Technical aspects: This EEG study was done with scalp electrodes positioned according to the 10-20 International system of electrode placement. Electrical activity was acquired at a sampling rate of 500Hz  and reviewed with a high frequency filter of 70Hz  and a low frequency filter of 1Hz . EEG data were recorded continuously and digitally stored. Description: During awake state, no clear posterior dominant rhythm was seen.  EEG showed continuous generalized polymorphic mixed frequencies with predominantly 6 to 8 Hz theta-alpha activity as well as intermittent 2 to 3 Hz delta activity.  Hyperventilation and photic stimulation were not performed.   ABNORMALITY -Continuous slow, generalized IMPRESSION: This study is suggestive of mild diffuse encephalopathy, nonspecific etiology. No seizures or epileptiform discharges were seen throughout the recording. Priyanka Barbra Sarks   Overnight EEG with video  Result Date: 08/17/2019 Lora Havens, MD     08/18/2019  8:31 AM Patient Name: Presley Summerlin MRN: [id number] Epilepsy Attending: Lora Havens Referring Physician/Provider: Dr. Karena Addison Aroor Duration: 08/16/2019 1701 to 08/17/2019 1701  Patient history: 54 year old male with staring episodes concerning for seizures.  Also had generalized tonic-clonic seizure-like episode.  EEG to evaluate for seizures.  Level of alertness: Awake, asleep  AEDs during EEG study: Keppra  Technical aspects: This EEG study was done with scalp electrodes positioned according to the 10-20 International system of electrode placement. Electrical activity was acquired at a sampling rate of 500Hz  and reviewed with a high frequency filter of 70Hz  and a low frequency filter of 1Hz . EEG data were recorded continuously and digitally stored.  Description: The  posterior dominant rhythm consists of 9-10 Hz activity of moderate voltage (25-35 uV) seen predominantly in posterior head regions, symmetric and reactive to eye opening and eye closing. Sleep was characterized by vertex waves, sleep spindles (12 to 14 Hz), maximal frontocentral region and posterior occipital sharp transients of sleep.  Hyperventilation and photic stimulation were not performed.   1 event was recorded at 1:32 on 08/17/2019.  Patient was laying in bed watching TV.  When nurse walked in and asked patient a question, he initially did not respond and was noted to have brief nonrhythmic jerking movement of bilateral upper extremity.  He eventually responded to the nurses question and asked if this event is being captured.  Concomitant EEG review before, during and after the event did not show any EEG change to suggest seizure.  IMPRESSION: This study is within normal limits. No seizures or epileptiform discharges were seen throughout the recording. One episode of decreased responsiveness was recorded as described above around 1:32 AM on 08/17/2019 without concomitant EEG change  and was a nonepileptic event. Lora Havens   ECHOCARDIOGRAM COMPLETE  Result Date: 08/17/2019    ECHOCARDIOGRAM REPORT   Patient Name:   COTY LARSH Date of Exam: 08/17/2019 Medical Rec #:  [id number]         Height:       72.0 in Accession #:    [id number]        Weight:       203.9 lb Date of Birth:  11-26-1965         BSA:          2.148 m Patient Age:    70 years          BP:           134/81 mmHg Patient Gender: M                 HR:           80 bpm. Exam Location:  Inpatient Procedure: 2D Echo, Cardiac Doppler, Color Doppler and Intracardiac            Opacification Agent Indications:    CHF-Acute Diastolic  History:        Patient has prior history of Echocardiogram examinations, most                 recent 09/05/2018. Risk Factors:Diabetes, Sleep Apnea and                 Hypertension. ESRD.  Sonographer:     Clayton Lefort RDCS (AE) Referring Phys: [id number]intosh [facility]  Sonographer Comments: Suboptimal subcostal window. Image acquisition challenging due to patient body habitus. IMPRESSIONS  1. Left ventricular ejection fraction, by estimation, is 25 to 30%. The left ventricle has severely decreased function. The left ventricle demonstrates global hypokinesis with regional variation and spares the apex. There is severe left ventricular hypertrophy. Left ventricular diastolic parameters are consistent with Grade I diastolic dysfunction (impaired relaxation). Elevated left ventricular end-diastolic pressure.  2. Right ventricular systolic function is moderately reduced. The right ventricular size is mildly enlarged.  3. Left atrial size was mildly dilated.  4. Right atrial size was severely dilated.  5. The mitral valve is abnormal. Trivial mitral valve regurgitation.  6. Tricuspid valve regurgitation is mild to moderate.  7. The aortic valve is tricuspid. Aortic valve regurgitation is not visualized. Mild aortic valve sclerosis is present, with no evidence of aortic valve stenosis.  8. Aortic dilatation noted. There is borderline dilatation of the ascending aorta measuring 39 mm. Comparison(s): Changes from prior study are noted. 09/05/18: 60-65%, mild LVH. Dr. Candiss Norse notified of acute changes. FINDINGS  Left Ventricle: Left ventricular ejection fraction, by estimation, is 25 to 30%. The left ventricle has severely decreased function. The left ventricle demonstrates global hypokinesis. Definity contrast agent was given IV to delineate the left ventricular endocardial borders. The left ventricular internal cavity size was normal in size. There is severe left ventricular hypertrophy. Left ventricular diastolic parameters are consistent with Grade I diastolic dysfunction (impaired relaxation). Elevated left ventricular end-diastolic pressure. Right Ventricle: The right ventricular size is mildly enlarged. No increase in right  ventricular wall thickness. Right ventricular systolic function is moderately reduced. Left Atrium: Left atrial size was mildly dilated. Right Atrium: Right atrial size was severely dilated. Pericardium: Trivial pericardial effusion is present. The pericardial effusion is circumferential. Mitral Valve: The mitral valve is abnormal. There is mild thickening of the mitral valve leaflet(s). Mild to moderate mitral annular calcification. Trivial mitral  valve regurgitation. MV peak gradient, 3.9 mmHg. The mean mitral valve gradient is 2.0 mmHg. Tricuspid Valve: The tricuspid valve is grossly normal. Tricuspid valve regurgitation is mild to moderate. Aortic Valve: The aortic valve is tricuspid. Aortic valve regurgitation is not visualized. Mild aortic valve sclerosis is present, with no evidence of aortic valve stenosis. Pulmonic Valve: The pulmonic valve was normal in structure. Pulmonic valve regurgitation is trivial. Aorta: Aortic dilatation noted. There is borderline dilatation of the ascending aorta measuring 39 mm. Venous: The inferior vena cava was not well visualized. IAS/Shunts: There is left bowing of the interatrial septum, suggestive of elevated right atrial pressure. No atrial level shunt detected by color flow Doppler.  LEFT VENTRICLE PLAX 2D LVIDd:         4.64 cm  Diastology LVIDs:         4.04 cm  LV e' lateral:   4.31 cm/s LV PW:         1.80 cm  LV E/e' lateral: 18.6 LV IVS:        1.99 cm  LV e' medial:    2.44 cm/s LVOT diam:     2.20 cm  LV E/e' medial:  32.8 LV SV:         29 LV SV Index:   13 LVOT Area:     3.80 cm  RIGHT VENTRICLE RV Basal diam:  3.92 cm RV Mid diam:    3.71 cm RV S prime:     5.12 cm/s TAPSE (M-mode): 1.1 cm LEFT ATRIUM              Index       RIGHT ATRIUM           Index LA diam:        3.40 cm  1.58 cm/m  RA Area:     29.00 cm LA Vol (A2C):   103.0 ml 47.95 ml/m RA Volume:   105.00 ml 48.88 ml/m LA Vol (A4C):   53.5 ml  24.90 ml/m LA Biplane Vol: 80.5 ml  37.47 ml/m   AORTIC VALVE AV Area (Vmax):    1.92 cm AV Area (Vmean):   1.69 cm AV Area (VTI):     1.62 cm AV Vmax:           96.30 cm/s AV Vmean:          72.600 cm/s AV VTI:            0.178 m AV Peak Grad:      3.7 mmHg AV Mean Grad:      2.0 mmHg LVOT Vmax:         48.70 cm/s LVOT Vmean:        32.200 cm/s LVOT VTI:          0.076 m LVOT/AV VTI ratio: 0.43  AORTA Ao Root diam: 3.70 cm Ao Asc diam:  3.90 cm MITRAL VALVE               TRICUSPID VALVE MV Area (PHT): 4.89 cm    TR Peak grad:   30.5 mmHg MV Peak grad:  3.9 mmHg    TR Vmax:        276.00 cm/s MV Mean grad:  2.0 mmHg MV Vmax:       0.99 m/s    SHUNTS MV Vmean:      57.4 cm/s   Systemic VTI:  0.08 m MV Decel Time: 155 msec    Systemic Diam: 2.20 cm MV E  velocity: 80.10 cm/s MV A velocity: 91.30 cm/s MV E/A ratio:  0.88 Lyman Bishop MD Electronically signed by Lyman Bishop MD Signature Date/Time: 08/17/2019/5:52:07 PM    Final      Medications:    . carvedilol  3.125 mg Oral BID WC  . Chlorhexidine Gluconate Cloth  6 each Topical Q0600  . cinacalcet  60 mg Oral Q M,W,F-HD  . docusate sodium  100 mg Oral BID  . doxercalciferol  4 mcg Intravenous Q M,W,F-HD  . heparin  5,000 Units Subcutaneous Q8H  . insulin aspart  0-9 Units Subcutaneous Q6H  . isosorbide-hydrALAZINE  1 tablet Oral TID  . latanoprost  1 drop Both Eyes QHS  . levETIRAcetam  500 mg Oral QHS  . metoCLOPramide  5 mg Oral BID AC  . multivitamin  1 tablet Oral QHS  . pantoprazole  40 mg Oral Daily  . sevelamer carbonate  2,400 mg Oral TID WC  . sodium chloride flush  3 mL Intravenous Q12H  . timolol  1 drop Both Eyes BID   acetaminophen **OR** [DISCONTINUED] acetaminophen, bisacodyl, hydrALAZINE, LORazepam, [DISCONTINUED] ondansetron **OR** ondansetron (ZOFRAN) IV  Assessment/ Plan:  Dialysis Orders: AF MWF 4 hrs 15 min 180NRe 450/Autoflow 2.0 90 Kg 2.0K/2.25 Ca UFP 2 AVG -Heparin 6000 units IV initial bolus Heparin 3000 units IV mid run -Sensipar 60 mg po TIW -Hectorol 4  mcg IV TIW  1.  AMS with multiple seizure-like episodes. Per primary/Neurology.  EEG no seizure activities.  Patient remains on [place] 2.  ESRD -  MWF. HD today on schedule. K+ 5.3.  Patient did not receive dialysis 08/17/2019 due to high dialysis volumes.  Patient received dialysis 08/18/2019 2.8 L removed.  Next dialysis treatment be 08/20/2019 3.  Hypertension/volume  - BP variable, actually on low side for him. No evidence of volume overload by exam. UF as tolerated 2.5-3 liters.  4.  Anemia  -HGB 15.1. Changed from Auryxia binders to renvela at OP center.  5.  Metabolic bone disease -  Ca 8.7 C Ca 9.3 PO4 8.4. 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 Continue binders/VDRA. 6.  Nutrition - Renal/Carb mod diet. Albumin 3.2. Add nepro/renal vits 7. DM-per primary 8.   HFpEF/CAD    LOS: 3 Sherril Croon @TODAY @7 :45 AM

## 2019-08-19 NOTE — Progress Notes (Addendum)
Neuro Progress Note  Subjective: No further seizures. Pt has no new complaints. MRI did not show any acute findings. Pt more alert and back to baseline post HD.   Examination  Vital signs in last 24 hours: Temp:  [97.7 F (36.5 C)-98.8 F (37.1 C)] 98.6 F (37 C) (05/30 0806) Pulse Rate:  [33-96] 93 (05/30 0806) Resp:  [8-25] 14 (05/30 0806) BP: (86-157)/(21-83) 118/69 (05/30 0806) SpO2:  [86 %-99 %] 97 % (05/30 0806) Weight:  [90.1 kg-97.2 kg] 97.2 kg (05/29 1758)  General: lying in bed; no acute distress CVS: pulse-normal rate and rhythm RS: breathing comfortably Extremities: normal   Neuro: MS: Sleeping, but alerts easily. Oriented, follows commands CN: pupils equal and reactive,  EOMI, face symmetric, tongue midline, normal sensation over face, Motor: 5/5 strength in upper extremities; bilat AKAs Coordination: normal Gait: not tested; unable  Basic Metabolic Panel: Recent Labs  Lab 08/16/19 0903 08/16/19 0903 08/16/19 2258 08/17/19 0828 08/17/19 1012 08/18/19 1434  NA 136  --   --  133* 134* 134*  K 4.7  --   --  5.1 5.3* 3.6  CL 90*  --   --  91* 91* 91*  CO2 32  --   --  27 30 27   GLUCOSE 227*  --   --  126* 143* 157*  BUN 29*  --   --  42* 41* 32*  CREATININE 9.84*  --  11.38* 11.86* 12.04* 8.89*  CALCIUM 9.1   < >  --  8.7* 8.7* 9.1  PHOS  --   --   --   --  8.4* 4.9*   < > = values in this interval not displayed.    CBC: Recent Labs  Lab 08/16/19 0903 08/16/19 2258 08/17/19 0407 08/18/19 1434  WBC 5.9 5.4 5.8 6.7  HGB 16.1 15.6 15.0 15.1  HCT 51.0 48.4 45.3 45.7  MCV 98.1 96.8 95.2 93.6  PLT 129* 128* PLATELET CLUMPS NOTED ON SMEAR, UNABLE TO ESTIMATE 151     Coagulation Studies: No results for input(s): LABPROT, INR in the last 72 hours.  Imaging Reviewed:     ASSESSMENT AND PLAN 54 year old male with ESRD on dialysis, obstructive sleep apnea, type 2 diabetes mellitus, bilateral amputation due to severe PVD, hypertension with past  history of seizures no longer on Dilantin presents to emergency department with 3 episodes of altered awareness concerning for seizures.  Patient was placed on overnight EEG- no seizures or epileptiform discharges throughout the recording. Encephalopathy improving slowly. 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 No acute findings.   Recommendations Continue Keppra 500 mg nightly Seizure precautions We will s/o. Please call back if needed Out pt f/u with [facility] or neurologist of pts choice in 101mo  Desiree Metzger-Cihelka, ARNP-C, ANVP-BC Pager: [phone] For questions after 7pm please refer to AMION to reach the Neurologist on call  NEUROHOSPITALIST ADDENDUM Performed a face to face diagnostic evaluation.   I have reviewed the contents of history and physical exam as documented by PA/ARNP/Resident and agree with above documentation.  I have discussed and formulated the above plan as documented. Edits to the note have been made as needed.  Patient back to baseline, appears to be tolerating Keppra well.  No further seizures since he has been in the hospital.  One event was nonepileptic.  MRI brain negative for acute strokes.  Neurology will sign off.  Outpatient neurology follow-up in 4 weeks  Per [facility] statutes, patients with  seizures are not allowed to drive until they have been seizure-free for six months. Use caution when using heavy equipment or power tools. Avoid working on ladders or at heights. Take showers instead of baths. Ensure the water temperature is not too high on the home water heater. Do not go swimming alone. Do not lock yourself in a room alone (i.e. bathroom). When caring for infants or small children, sit down when holding, feeding, or changing them to minimize risk of injury to the child in the event you have a seizure. Maintain good sleep hygiene. Avoid alcohol.    If Howard Bell has another seizure, call 911 and bring  them back to the ED if:       A.  The seizure lasts longer than 5 minutes.            B.  The patient doesn't wake shortly after the seizure or has new problems such as difficulty seeing, speaking or moving following the seizure       C.  The patient was injured during the seizure       D.  The patient has a temperature over 102 F (39C)       E.  The patient vomited during the seizure and now is having trouble breathing      Karena Addison Jihan Rudy MD [facility] [phone]   If 7pm to 7am, please call on call as listed on AMION.

## 2019-08-20 ENCOUNTER — Telehealth: Payer: Self-pay | Admitting: Nephrology

## 2019-08-20 NOTE — Telephone Encounter (Signed)
Transition of care contact from inpatient facility  Date of Discharge: 08/19/19 Date of Contact: 08/20/19 Method of contact: phone Talked with: no answer - left message. Advised to call his dialysis unit if any additional questions and they will contact on call CKD provider.  Patient contact to discuss transition of care from recent inpatient hospitalization. Pateint was admitted to [facility] from : 5/27 - 08/19/19  with the diagnosis of AMS with selizure like episodes and hx prior sz - d/c on keppra - no driving until seen by Neuro  Patient will be seen at dialysis 08/22/19 by rounding provider.   Amalia Hailey, PA-C [facility] Pager:  [phone]

## 2019-08-21 ENCOUNTER — Telehealth: Payer: Self-pay | Admitting: Physician Assistant

## 2019-08-21 NOTE — Telephone Encounter (Signed)
Transition of care contact from inpatient facility  Date of Discharge: 08/19/19 Date of Contact: 08/21/19 Method of contact: Phone  Attempted to contact patient to discuss transition of care from inpatient admission. Patient did not answer the phone. Message was left on the patient's voicemail with call back instructions (see telephone encounter 5/31). Will follow up with patient at outpatient HD center.   Anice Paganini, PA-C 08/21/2019, 2:10 PM  [employer] Pager: [phone]

## 2019-08-28 ENCOUNTER — Ambulatory Visit: Payer: Medicare Other | Admitting: Physician Assistant

## 2019-09-11 ENCOUNTER — Other Ambulatory Visit: Payer: Self-pay

## 2019-09-11 ENCOUNTER — Encounter: Payer: Self-pay | Admitting: Endocrinology

## 2019-09-11 ENCOUNTER — Ambulatory Visit (INDEPENDENT_AMBULATORY_CARE_PROVIDER_SITE_OTHER): Payer: Medicare Other | Admitting: Endocrinology

## 2019-09-11 VITALS — BP 122/60 | HR 53 | Wt 215.0 lb

## 2019-09-11 DIAGNOSIS — E1165 Type 2 diabetes mellitus with hyperglycemia: Secondary | ICD-10-CM

## 2019-09-11 DIAGNOSIS — IMO0002 Reserved for concepts with insufficient information to code with codable children: Secondary | ICD-10-CM

## 2019-09-11 DIAGNOSIS — E1151 Type 2 diabetes mellitus with diabetic peripheral angiopathy without gangrene: Secondary | ICD-10-CM

## 2019-09-11 MED ORDER — HUMULIN N KWIKPEN 100 UNIT/ML ~~LOC~~ SUPN: 14.0000 [IU] | PEN_INJECTOR | SUBCUTANEOUS | 11 refills | Status: DC

## 2019-09-11 NOTE — Progress Notes (Signed)
Subjective:    Patient ID: Howard Bell, male    DOB: [birth year], 54 y.o.   MRN: [id number]  HPI Pt returns for f/u of diabetes mellitus: DM type: 1 Dx'ed: 0867 Complications: polyneuropathy and ESRD (HD).  Therapy: insulin since soon after dx.  DKA: last episode was 2003 Severe hypoglycemia: last episode was in 2008.   Pancreatitis: never.   Pancreatic imaging: normal on 2019 CT Other: he takes multiple daily injections; he does not need basal insulin; fructosamine shows much worse glycemic control than A1c.   Interval history: no cbg record, but states cbg's are in the 100's.  He says cbg is about the same whether he or wife checks it.  Pt says cbg meter is just a few mos old.  He is overdue for f/u.  Pt says he takes just 2 injections per day, as he eats 2 meals per day.   Past Medical History:  Diagnosis Date  . Arthritis   . Congestive heart failure (CHF) ([place])   . Depression   . 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 Diabetic gastroparesis ([place]) 11/06/2017  . ESRD (end stage renal disease) on dialysis [facility])    M,W.F dialysis  . H/O seasonal allergies   . Hypertension   . Pneumonia   . Sleep apnea    not using it now, on continuous O2 2 L during the day, 3 L during the night    Past Surgical History:  Procedure Laterality Date  . AMPUTATION Right 05/14/2018   Procedure: AMPUTATION BELOW KNEE;  Surgeon: Newt Minion, MD;  Location: [place];  Service: Orthopedics;  Laterality: Right;  . AMPUTATION Right 08/04/2018   Procedure: RIGHT ABOVE KNEE AMPUTATION;  Surgeon: Newt Minion, MD;  Location: [place];  Service: Orthopedics;  Laterality: Right;  . AMPUTATION Left 09/08/2018   Procedure: LEFT ABOVE KNEE AMPUTATION;  Surgeon: Newt Minion, MD;  Location: [place];  Service: Orthopedics;  Laterality: Left;  . APPLICATION OF WOUND VAC Left 09/08/2018   Procedure: Application Of Wound Vac;  Surgeon: Newt Minion, MD;  Location: [place];  Service:  Orthopedics;  Laterality: Left;  . ESOPHAGOGASTRODUODENOSCOPY (EGD) WITH PROPOFOL N/A 12/05/2017   Procedure: ESOPHAGOGASTRODUODENOSCOPY (EGD) WITH PROPOFOL;  Surgeon: Doran Stabler, MD;  Location: [facility];  Service: Gastroenterology;  Laterality: N/A;  . HERNIA REPAIR    . IR AV DIALY SHUNT INTRO NEEDLE/INTRACATH INITIAL W/PTA/IMG LEFT  03/30/2017    Social History   Socioeconomic History  . Marital status: Married    Spouse name: Not on file  . Number of children: Not on file  . Years of education: Not on file  . Highest education level: Not on file  Occupational History  . Not on file  Tobacco Use  . Smoking status: Never Smoker  . Smokeless tobacco: Never Used  Vaping Use  . Vaping Use: Never used  Substance and Sexual Activity  . Alcohol use: No  . Drug use: No  . Sexual activity: Not on file  Other Topics Concern  . Not on file  Social History Narrative  . Not on file   Social Determinants of Health   Financial Resource Strain:   . Difficulty of Paying Living Expenses:   Food Insecurity:   . Worried About Charity fundraiser in the Last Year:   . Arboriculturist in the Last Year:   Transportation Needs:   . Lack of Transportation (  Medical):   Marland Kitchen Lack of Transportation (Non-Medical):   Physical Activity:   . Days of Exercise per Week:   . Minutes of Exercise per Session:   Stress:   . Feeling of Stress :   Social Connections:   . Frequency of Communication with Friends and Family:   . Frequency of Social Gatherings with Friends and Family:   . Attends Religious Services:   . Active Member of Clubs or Organizations:   . Attends Archivist Meetings:   Marland Kitchen Marital Status:   Intimate Partner Violence:   . Fear of Current or Ex-Partner:   . Emotionally Abused:   Marland Kitchen Physically Abused:   . Sexually Abused:     Current Outpatient Medications on File Prior to Visit  Medication Sig Dispense Refill  . acetaminophen (TYLENOL) 325 MG tablet Take 1-2  tablets (325-650 mg total) by mouth every 4 (four) hours as needed for mild pain.    Marland Kitchen albuterol (PROVENTIL HFA;VENTOLIN HFA) 108 (90 Base) MCG/ACT inhaler Inhale 1 puff into the lungs every 4 (four) hours as needed for wheezing or shortness of breath.     . carvedilol (COREG) 6.25 MG tablet Take 1 tablet (6.25 mg total) by mouth 2 (two) times daily with a meal. 60 tablet 0  . docusate sodium (COLACE) 100 MG capsule Take 1 capsule (100 mg total) by mouth 2 (two) times daily. 60 capsule 1  . ferric citrate (AURYXIA) 1 GM 210 MG(Fe) tablet Take 1 tablet (210 mg total) by mouth 3 (three) times daily with meals. 90 tablet 1  . fluticasone (FLONASE) 50 MCG/ACT nasal spray Place 1 spray into both nostrils daily as needed for allergies.     Marland Kitchen glucose blood test strip 1 each by Other route as needed for other. Use as instructed 100 each 11  . isosorbide-hydrALAZINE (BIDIL) 20-37.5 MG tablet Take 1 tablet by mouth 3 (three) times daily. 90 tablet 0  . Lancets (ACCU-CHEK SOFT TOUCH) lancets Use as instructed 100 each 12  . latanoprost (XALATAN) 0.005 % ophthalmic solution Place 1 drop into both eyes at bedtime.    . levETIRAcetam (KEPPRA) 500 MG tablet Take 1 tablet (500 mg total) by mouth at bedtime. 30 tablet 0  . metoCLOPramide (REGLAN) 5 MG tablet Take 1 tablet (5 mg total) by mouth 3 (three) times daily before meals. (Patient taking differently: Take 5 mg by mouth 2 (two) times daily before a meal. ) 90 tablet 0  . multivitamin (RENA-VIT) TABS tablet Take 1 tablet by mouth at bedtime. 30 tablet 1  . omeprazole (PRILOSEC) 20 MG capsule Take 1 capsule (20 mg total) by mouth at bedtime. 90 capsule 3  . timolol (BETIMOL) 0.5 % ophthalmic solution Place 1 drop into both eyes 2 (two) times daily. 10 mL 1   No current facility-administered medications on file prior to visit.    Allergies  Allergen Reactions  . Gabapentin Other (See Comments)    Altered mental status    Family History  Problem Relation  Age of Onset  . Diabetes Mother   . Hypertension Father     BP 122/60   Pulse (!) 53   Wt 215 lb (97.5 kg) Comment: Verbalized  SpO2 97%   BMI 29.16 kg/m    Review of Systems Denies memory loss.  He denies hypoglycemia.      Objective:   Physical Exam VITAL SIGNS:  See vs page.   GENERAL: no distress.  In wheelchair.  EXT: bilat AKA's.  Lab Results  Component Value Date   HGBA1C 10.7 (H) 08/16/2019   Lab Results  Component Value Date   CREATININE 8.89 (H) 08/18/2019   BUN 32 (H) 08/18/2019   NA 134 (L) 08/18/2019   K 3.6 08/18/2019   CL 91 (L) 08/18/2019   CO2 27 08/18/2019    Lab Results  Component Value Date   TSH 5.715 (H) 08/16/2019       Assessment & Plan:  elev TSH, new to me.  Recheck today Insulin-requiring type 2 DM: due to SDOH, he needs a simpler regimen  Patient Instructions  I have sent a prescription to your pharmacy, to change to a once a day insulin. On this type of insulin schedule, you should eat something on a regular schedule.  If a meal is missed or significantly delayed, your blood sugar could go low.  Blood tests are requested for you today.  We'll let you know about the results.   Please come back for a follow-up appointment in 1 month.   check your blood sugar twice a day.  vary the time of day when you check, between before the 3 meals, and at bedtime.  also check if you have symptoms of your blood sugar being too high or too low.  please keep a record of the readings and bring it to your next appointment here (or you can bring the meter itself).  You can write it on any.   piece of paper.  please call us sooner if your blood sugar goes below 70, or if you have a lot of readings over 200.

## 2019-09-11 NOTE — Patient Instructions (Addendum)
I have sent a prescription to your pharmacy, to change to a once a day insulin. On this type of insulin schedule, you should eat something on a regular schedule.  If a meal is missed or significantly delayed, your blood sugar could go low.  Blood tests are requested for you today.  We'll let you know about the results.   Please come back for a follow-up appointment in 1 month.   check your blood sugar twice a day.  vary the time of day when you check, between before the 3 meals, and at bedtime.  also check if you have symptoms of your blood sugar being too high or too low.  please keep a record of the readings and bring it to your next appointment here (or you can bring the meter itself).  You can write it on any.   piece of paper.  please call us sooner if your blood sugar goes below 70, or if you have a lot of readings over 200.

## 2019-09-12 LAB — TSH: TSH: 5.75 u[IU]/mL — ABNORMAL HIGH (ref 0.35–4.50)

## 2019-09-12 MED ORDER — LEVOTHYROXINE SODIUM 50 MCG PO TABS
50.0000 ug | ORAL_TABLET | Freq: Every day | ORAL | 3 refills | Status: AC
Start: 2019-09-12 — End: ?

## 2019-09-12 NOTE — Progress Notes (Deleted)
Cardiology Clinic Note   Patient Name: Howard Bell Date of Encounter: 09/12/2019  Primary Care Provider:  Horald Pollen, MD Primary Cardiologist:  Quay Burow, MD  Patient Profile    Howard Bell 54 year old male presents to the clinic today for follow-up for his chronic  systolic and diastolic heart failure.  Past Medical History    Past Medical History:  Diagnosis Date  . Arthritis   . Congestive heart failure (CHF) ([place])   . Depression   . 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 Diabetic gastroparesis ([place]) 11/06/2017  . ESRD (end stage renal disease) on dialysis [facility])    M,W.F dialysis  . H/O seasonal allergies   . Hypertension   . Pneumonia   . Sleep apnea    not using it now, on continuous O2 2 L during the day, 3 L during the night   Past Surgical History:  Procedure Laterality Date  . AMPUTATION Right 05/14/2018   Procedure: AMPUTATION BELOW KNEE;  Surgeon: Newt Minion, MD;  Location: [place];  Service: Orthopedics;  Laterality: Right;  . AMPUTATION Right 08/04/2018   Procedure: RIGHT ABOVE KNEE AMPUTATION;  Surgeon: Newt Minion, MD;  Location: [place];  Service: Orthopedics;  Laterality: Right;  . AMPUTATION Left 09/08/2018   Procedure: LEFT ABOVE KNEE AMPUTATION;  Surgeon: Newt Minion, MD;  Location: [place];  Service: Orthopedics;  Laterality: Left;  . APPLICATION OF WOUND VAC Left 09/08/2018   Procedure: Application Of Wound Vac;  Surgeon: Newt Minion, MD;  Location: [place];  Service: Orthopedics;  Laterality: Left;  . ESOPHAGOGASTRODUODENOSCOPY (EGD) WITH PROPOFOL N/A 12/05/2017   Procedure: ESOPHAGOGASTRODUODENOSCOPY (EGD) WITH PROPOFOL;  Surgeon: Doran Stabler, MD;  Location: [facility];  Service: Gastroenterology;  Laterality: N/A;  . HERNIA REPAIR    . IR AV DIALY SHUNT INTRO NEEDLE/INTRACATH INITIAL W/PTA/IMG LEFT  03/30/2017    Allergies  Allergies  Allergen Reactions  . Gabapentin Other  (See Comments)    Altered mental status    History of Present Illness    Howard Bell has a PMH of combined chronic systolic and diastolic heart failure, essential hypertension, bilateral BKA, OSA on CPAP, obesity, AMS, ESRD on HD, and type 2 diabetes.  He was admitted to the hospital 08/16/19-08/19/19 with AMS and ongoing seizure-like activity.  He had previously been on Dilantin which was stopped several years ago.  A CTA of his head showed no acute changes and no CVA.  An EEG did not show seizure-like activity.  However clinical improvement was seen with IV Keppra.  Brain MRI also showed no acute changes.  He was euvolemic, blood pressure stable, placed on carvedilol, hydralazine and Imdur.  His amlodipine was stopped.  ARB/ACE avoided due to hyperkalemia.  He was noted to have a mild rise in troponin however it was felt to be non-ACS and no acute changes were seen on his EKG.  He presents to the clinic today for follow-up evaluation and states***  *** denies chest pain, shortness of breath, lower extremity edema, fatigue, palpitations, melena, hematuria, hemoptysis, diaphoresis, weakness, presyncope, syncope, orthopnea, and PND.   Home Medications    Prior to Admission medications   Medication Sig Start Date End Date Taking? Authorizing Provider  acetaminophen (TYLENOL) 325 MG tablet Take 1-2 tablets (325-650 mg total) by mouth every 4 (four) hours as needed for mild pain. 09/26/18   Love, Ivan Anchors, PA-C  albuterol (PROVENTIL HFA;VENTOLIN HFA) 108 (90 Base)  MCG/ACT inhaler Inhale 1 puff into the lungs every 4 (four) hours as needed for wheezing or shortness of breath.     [provider]  carvedilol (COREG) 6.25 MG tablet Take 1 tablet (6.25 mg total) by mouth 2 (two) times daily with a meal. 08/19/19   Thurnell Lose, MD  docusate sodium (COLACE) 100 MG capsule Take 1 capsule (100 mg total) by mouth 2 (two) times daily. 01/22/19   Horald Pollen, MD  ferric citrate  (AURYXIA) 1 GM 210 MG(Fe) tablet Take 1 tablet (210 mg total) by mouth 3 (three) times daily with meals. 09/29/18   Love, Ivan Anchors, PA-C  fluticasone (FLONASE) 50 MCG/ACT nasal spray Place 1 spray into both nostrils daily as needed for allergies.     [provider]  glucose blood test strip 1 each by Other route as needed for other. Use as instructed 10/31/18   Horald Pollen, MD  Insulin NPH, Human,, Isophane, (HUMULIN N KWIKPEN) 100 UNIT/ML Kiwkpen Inject 14 Units into the skin every morning. And pen needles 1/day 09/11/19   Renato Shin, MD  isosorbide-hydrALAZINE (BIDIL) 20-37.5 MG tablet Take 1 tablet by mouth 3 (three) times daily. 08/19/19   Thurnell Lose, MD  Lancets (ACCU-CHEK SOFT [facility]) lancets Use as instructed 10/31/18   Horald Pollen, MD  latanoprost (XALATAN) 0.005 % ophthalmic solution Place 1 drop into both eyes at bedtime.    [provider]  levETIRAcetam (KEPPRA) 500 MG tablet Take 1 tablet (500 mg total) by mouth at bedtime. 08/19/19   Thurnell Lose, MD  levothyroxine (SYNTHROID) 50 MCG tablet Take 1 tablet (50 mcg total) by mouth daily. 09/12/19   Renato Shin, MD  metoCLOPramide (REGLAN) 5 MG tablet Take 1 tablet (5 mg total) by mouth 3 (three) times daily before meals. Patient taking differently: Take 5 mg by mouth 2 (two) times daily before a meal.  08/30/18   Love, Ivan Anchors, PA-C  multivitamin (RENA-VIT) TABS tablet Take 1 tablet by mouth at bedtime. 01/22/19   Horald Pollen, MD  omeprazole (PRILOSEC) 20 MG capsule Take 1 capsule (20 mg total) by mouth at bedtime. 01/22/19   Horald Pollen, MD  timolol (BETIMOL) 0.5 % ophthalmic solution Place 1 drop into both eyes 2 (two) times daily. 05/07/17   Joretta Bachelor, PA    Family History    Family History  Problem Relation Age of Onset  . Diabetes Mother   . Hypertension Father    He indicated that his mother is deceased. He indicated that his father is alive. He  indicated that his brother is alive.  Social History    Social History   Socioeconomic History  . Marital status: Married    Spouse name: Not on file  . Number of children: Not on file  . Years of education: Not on file  . Highest education level: Not on file  Occupational History  . Not on file  Tobacco Use  . Smoking status: Never Smoker  . Smokeless tobacco: Never Used  Vaping Use  . Vaping Use: Never used  Substance and Sexual Activity  . Alcohol use: No  . Drug use: No  . Sexual activity: Not on file  Other Topics Concern  . Not on file  Social History Narrative  . Not on file   Social Determinants of Health   Financial Resource Strain:   . Difficulty of Paying Living Expenses:   Food Insecurity:   . Worried  About Running Out of Food in the Last Year:   . [place] in the Last Year:   Transportation Needs:   . Lack of Transportation (Medical):   Marland Kitchen Lack of Transportation (Non-Medical):   Physical Activity:   . Days of Exercise per Week:   . Minutes of Exercise per Session:   Stress:   . Feeling of Stress :   Social Connections:   . Frequency of Communication with Friends and Family:   . Frequency of Social Gatherings with Friends and Family:   . Attends Religious Services:   . Active Member of Clubs or Organizations:   . Attends Archivist Meetings:   Marland Kitchen Marital Status:   Intimate Partner Violence:   . Fear of Current or Ex-Partner:   . Emotionally Abused:   Marland Kitchen Physically Abused:   . Sexually Abused:      Review of Systems    General:  No chills, fever, night sweats or weight changes.  Cardiovascular:  No chest pain, dyspnea on exertion, edema, orthopnea, palpitations, paroxysmal nocturnal dyspnea. Dermatological: No rash, lesions/masses Respiratory: No cough, dyspnea Urologic: No hematuria, dysuria Abdominal:   No nausea, vomiting, diarrhea, bright red blood per rectum, melena, or hematemesis Neurologic:  No visual changes, wkns,  changes in mental status. All other systems reviewed and are otherwise negative except as noted above.  Physical Exam    VS:  There were no vitals taken for this visit. , BMI There is no height or weight on file to calculate BMI. GEN: Well nourished, well developed, in no acute distress. HEENT: normal. Neck: Supple, no JVD, carotid bruits, or masses. Cardiac: RRR, no murmurs, rubs, or gallops. No clubbing, cyanosis, edema.  Radials/DP/PT 2+ and equal bilaterally.  Respiratory:  Respirations regular and unlabored, clear to auscultation bilaterally. GI: Soft, nontender, nondistended, BS + x 4. MS: no deformity or atrophy. Skin: warm and dry, no rash. Neuro:  Strength and sensation are intact. Psych: Normal affect.  Accessory Clinical Findings    ECG personally reviewed by me today- *** - No acute changes  EKG 08/17/2019 Sinus rhythm possible anterior infarct undetermined age 22 bpm  Echocardiogram 08/17/2019 IMPRESSIONS    1. Left ventricular ejection fraction, by estimation, is 25 to 30%. The  left ventricle has severely decreased function. The left ventricle  demonstrates global hypokinesis with regional variation and spares the  apex. There is severe left ventricular  hypertrophy. Left ventricular diastolic parameters are consistent with  Grade I diastolic dysfunction (impaired relaxation). Elevated left  ventricular end-diastolic pressure.  2. Right ventricular systolic function is moderately reduced. The right  ventricular size is mildly enlarged.  3. Left atrial size was mildly dilated.  4. Right atrial size was severely dilated.  5. The mitral valve is abnormal. Trivial mitral valve regurgitation.  6. Tricuspid valve regurgitation is mild to moderate.  7. The aortic valve is tricuspid. Aortic valve regurgitation is not  visualized. Mild aortic valve sclerosis is present, with no evidence of  aortic valve stenosis.  8. Aortic dilatation noted. There is borderline  dilatation of the  ascending aorta measuring 39 mm.   Comparison(s): Changes from prior study are noted. 09/05/18: 60-65%, mild  LVH. Dr. Candiss Norse notified of acute changes.  Assessment & Plan   1.  Acute on chronic systolic and diastolic CHF-euvolemic today.  No increased work of breathing or chest discomfort.  Echocardiogram showed LVEF of 25-30% down from 60-65%, G1 DD, mildly dilated left and severely dilated right  atria.  Aortic dilation 33 mm also noted.  ACE/ARB avoided due to hyperkalemia and renal function. Continue carvedilol, BiDil Heart healthy low-sodium diet-salty 6 given Increase physical activity as tolerated Daily weights Order CBC, CMP  Essential hypertension-BP today*** Continue carvedilol, hydralazine, isosorbide Heart healthy low-sodium diet-salty 6 given Increase physical activity as tolerated Blood pressure log  Obesity-weight today***.  Weight at discharge 215 pounds Continue weight loss Heart healthy low-sodium diet Increase physical activity as tolerated  Diabetes mellitus type II-A1c 10.7 on 08/16/2019 Followed by PCP  Disposition: Follow-up with Dr. Gwenlyn Found in 3 months.  Jossie Ng. Zohra Clavel NP-C    09/12/2019, [facility] [address] Office [phone] Fax [phone]

## 2019-09-13 ENCOUNTER — Ambulatory Visit: Payer: Medicare Other | Admitting: General Practice

## 2019-10-08 ENCOUNTER — Other Ambulatory Visit: Payer: Self-pay

## 2019-10-08 ENCOUNTER — Ambulatory Visit: Payer: Self-pay | Admitting: Emergency Medicine

## 2019-10-08 ENCOUNTER — Emergency Department (HOSPITAL_COMMUNITY): Payer: Medicare Other

## 2019-10-08 ENCOUNTER — Encounter (HOSPITAL_COMMUNITY): Payer: Self-pay | Admitting: Emergency Medicine

## 2019-10-08 ENCOUNTER — Inpatient Hospital Stay (HOSPITAL_COMMUNITY)
Admission: EM | Admit: 2019-10-08 | Discharge: 2019-10-17 | DRG: 871 | Disposition: A | Discharge status: Home Health | Payer: Medicare Other | Attending: Internal Medicine | Admitting: Internal Medicine

## 2019-10-08 DIAGNOSIS — I953 Hypotension of hemodialysis: Secondary | ICD-10-CM | POA: Diagnosis present

## 2019-10-08 DIAGNOSIS — R140 Abdominal distension (gaseous): Secondary | ICD-10-CM

## 2019-10-08 DIAGNOSIS — R55 Syncope and collapse: Secondary | ICD-10-CM | POA: Diagnosis present

## 2019-10-08 DIAGNOSIS — E039 Hypothyroidism, unspecified: Secondary | ICD-10-CM | POA: Diagnosis present

## 2019-10-08 DIAGNOSIS — R778 Other specified abnormalities of plasma proteins: Secondary | ICD-10-CM | POA: Diagnosis present

## 2019-10-08 DIAGNOSIS — H409 Unspecified glaucoma: Secondary | ICD-10-CM | POA: Diagnosis present

## 2019-10-08 DIAGNOSIS — Z8673 Personal history of transient ischemic attack (TIA), and cerebral infarction without residual deficits: Secondary | ICD-10-CM

## 2019-10-08 DIAGNOSIS — J9602 Acute respiratory failure with hypercapnia: Secondary | ICD-10-CM | POA: Diagnosis present

## 2019-10-08 DIAGNOSIS — G4733 Obstructive sleep apnea (adult) (pediatric): Secondary | ICD-10-CM | POA: Diagnosis present

## 2019-10-08 DIAGNOSIS — A419 Sepsis, unspecified organism: Secondary | ICD-10-CM | POA: Diagnosis not present

## 2019-10-08 DIAGNOSIS — M898X9 Other specified disorders of bone, unspecified site: Secondary | ICD-10-CM | POA: Diagnosis present

## 2019-10-08 DIAGNOSIS — Z833 Family history of diabetes mellitus: Secondary | ICD-10-CM

## 2019-10-08 DIAGNOSIS — J690 Pneumonitis due to inhalation of food and vomit: Secondary | ICD-10-CM | POA: Diagnosis not present

## 2019-10-08 DIAGNOSIS — G9341 Metabolic encephalopathy: Secondary | ICD-10-CM | POA: Diagnosis present

## 2019-10-08 DIAGNOSIS — Z89512 Acquired absence of left leg below knee: Secondary | ICD-10-CM

## 2019-10-08 DIAGNOSIS — Z79899 Other long term (current) drug therapy: Secondary | ICD-10-CM

## 2019-10-08 DIAGNOSIS — N186 End stage renal disease: Secondary | ICD-10-CM | POA: Diagnosis present

## 2019-10-08 DIAGNOSIS — Z6834 Body mass index (BMI) 34.0-34.9, adult: Secondary | ICD-10-CM

## 2019-10-08 DIAGNOSIS — Z794 Long term (current) use of insulin: Secondary | ICD-10-CM

## 2019-10-08 DIAGNOSIS — Z888 Allergy status to other drugs, medicaments and biological substances status: Secondary | ICD-10-CM

## 2019-10-08 DIAGNOSIS — J189 Pneumonia, unspecified organism: Secondary | ICD-10-CM | POA: Diagnosis present

## 2019-10-08 DIAGNOSIS — Z89511 Acquired absence of right leg below knee: Secondary | ICD-10-CM

## 2019-10-08 DIAGNOSIS — M1990 Unspecified osteoarthritis, unspecified site: Secondary | ICD-10-CM | POA: Diagnosis present

## 2019-10-08 DIAGNOSIS — I509 Heart failure, unspecified: Secondary | ICD-10-CM

## 2019-10-08 DIAGNOSIS — R569 Unspecified convulsions: Secondary | ICD-10-CM

## 2019-10-08 DIAGNOSIS — J90 Pleural effusion, not elsewhere classified: Secondary | ICD-10-CM

## 2019-10-08 DIAGNOSIS — Z79890 Hormone replacement therapy (postmenopausal): Secondary | ICD-10-CM

## 2019-10-08 DIAGNOSIS — J918 Pleural effusion in other conditions classified elsewhere: Secondary | ICD-10-CM | POA: Diagnosis present

## 2019-10-08 DIAGNOSIS — J9621 Acute and chronic respiratory failure with hypoxia: Secondary | ICD-10-CM | POA: Diagnosis present

## 2019-10-08 DIAGNOSIS — R34 Anuria and oliguria: Secondary | ICD-10-CM | POA: Diagnosis present

## 2019-10-08 DIAGNOSIS — D631 Anemia in chronic kidney disease: Secondary | ICD-10-CM | POA: Diagnosis present

## 2019-10-08 DIAGNOSIS — I5043 Acute on chronic combined systolic (congestive) and diastolic (congestive) heart failure: Secondary | ICD-10-CM | POA: Diagnosis not present

## 2019-10-08 DIAGNOSIS — I959 Hypotension, unspecified: Secondary | ICD-10-CM | POA: Diagnosis present

## 2019-10-08 DIAGNOSIS — E1122 Type 2 diabetes mellitus with diabetic chronic kidney disease: Secondary | ICD-10-CM | POA: Diagnosis present

## 2019-10-08 DIAGNOSIS — I447 Left bundle-branch block, unspecified: Secondary | ICD-10-CM | POA: Diagnosis present

## 2019-10-08 DIAGNOSIS — I9589 Other hypotension: Secondary | ICD-10-CM | POA: Diagnosis not present

## 2019-10-08 DIAGNOSIS — E861 Hypovolemia: Secondary | ICD-10-CM

## 2019-10-08 DIAGNOSIS — G40909 Epilepsy, unspecified, not intractable, without status epilepticus: Secondary | ICD-10-CM | POA: Diagnosis present

## 2019-10-08 DIAGNOSIS — K219 Gastro-esophageal reflux disease without esophagitis: Secondary | ICD-10-CM | POA: Diagnosis present

## 2019-10-08 DIAGNOSIS — E8889 Other specified metabolic disorders: Secondary | ICD-10-CM | POA: Diagnosis present

## 2019-10-08 DIAGNOSIS — R112 Nausea with vomiting, unspecified: Secondary | ICD-10-CM | POA: Diagnosis present

## 2019-10-08 DIAGNOSIS — F329 Major depressive disorder, single episode, unspecified: Secondary | ICD-10-CM | POA: Diagnosis present

## 2019-10-08 DIAGNOSIS — Z992 Dependence on renal dialysis: Secondary | ICD-10-CM

## 2019-10-08 DIAGNOSIS — Z20822 Contact with and (suspected) exposure to covid-19: Secondary | ICD-10-CM | POA: Diagnosis present

## 2019-10-08 DIAGNOSIS — I132 Hypertensive heart and chronic kidney disease with heart failure and with stage 5 chronic kidney disease, or end stage renal disease: Secondary | ICD-10-CM | POA: Diagnosis present

## 2019-10-08 DIAGNOSIS — E1143 Type 2 diabetes mellitus with diabetic autonomic (poly)neuropathy: Secondary | ICD-10-CM | POA: Diagnosis present

## 2019-10-08 DIAGNOSIS — Z8249 Family history of ischemic heart disease and other diseases of the circulatory system: Secondary | ICD-10-CM

## 2019-10-08 DIAGNOSIS — E1165 Type 2 diabetes mellitus with hyperglycemia: Secondary | ICD-10-CM | POA: Diagnosis present

## 2019-10-08 DIAGNOSIS — R1310 Dysphagia, unspecified: Secondary | ICD-10-CM

## 2019-10-08 DIAGNOSIS — E669 Obesity, unspecified: Secondary | ICD-10-CM | POA: Diagnosis present

## 2019-10-08 DIAGNOSIS — Z9981 Dependence on supplemental oxygen: Secondary | ICD-10-CM

## 2019-10-08 LAB — CBC WITH DIFFERENTIAL/PLATELET
Abs Immature Granulocytes: 0.03 10*3/uL (ref 0.00–0.07)
Basophils Absolute: 0.1 10*3/uL (ref 0.0–0.1)
Basophils Relative: 1 %
Eosinophils Absolute: 0.1 10*3/uL (ref 0.0–0.5)
Eosinophils Relative: 1 %
HCT: 50.2 % (ref 39.0–52.0)
Hemoglobin: 15.7 g/dL (ref 13.0–17.0)
Immature Granulocytes: 0 %
Lymphocytes Relative: 19 %
Lymphs Abs: 1.3 10*3/uL (ref 0.7–4.0)
MCH: 31.5 pg (ref 26.0–34.0)
MCHC: 31.3 g/dL (ref 30.0–36.0)
MCV: 100.8 fL — ABNORMAL HIGH (ref 80.0–100.0)
Monocytes Absolute: 0.6 10*3/uL (ref 0.1–1.0)
Monocytes Relative: 9 %
Neutro Abs: 4.7 10*3/uL (ref 1.7–7.7)
Neutrophils Relative %: 70 %
Platelets: 124 10*3/uL — ABNORMAL LOW (ref 150–400)
RBC: 4.98 MIL/uL (ref 4.22–5.81)
RDW: 18.6 % — ABNORMAL HIGH (ref 11.5–15.5)
WBC: 6.8 10*3/uL (ref 4.0–10.5)
nRBC: 0.4 % — ABNORMAL HIGH (ref 0.0–0.2)

## 2019-10-08 LAB — COMPREHENSIVE METABOLIC PANEL
ALT: 18 U/L (ref 0–44)
AST: 22 U/L (ref 15–41)
Albumin: 3.4 g/dL — ABNORMAL LOW (ref 3.5–5.0)
Alkaline Phosphatase: 80 U/L (ref 38–126)
Anion gap: 14 (ref 5–15)
BUN: 27 mg/dL — ABNORMAL HIGH (ref 6–20)
CO2: 30 mmol/L (ref 22–32)
Calcium: 9.7 mg/dL (ref 8.9–10.3)
Chloride: 91 mmol/L — ABNORMAL LOW (ref 98–111)
Creatinine, Ser: 8.54 mg/dL — ABNORMAL HIGH (ref 0.61–1.24)
GFR calc Af Amer: 7 mL/min — ABNORMAL LOW (ref >60)
GFR calc non Af Amer: 6 mL/min — ABNORMAL LOW (ref >60)
Glucose, Bld: 309 mg/dL — ABNORMAL HIGH (ref 70–99)
Potassium: 3.8 mmol/L (ref 3.5–5.1)
Sodium: 135 mmol/L (ref 135–145)
Total Bilirubin: 1.3 mg/dL — ABNORMAL HIGH (ref 0.3–1.2)
Total Protein: 7.1 g/dL (ref 6.5–8.1)

## 2019-10-08 LAB — I-STAT VENOUS BLOOD GAS, ED
Acid-Base Excess: 7 mmol/L — ABNORMAL HIGH (ref 0.0–2.0)
Bicarbonate: 35.6 mmol/L — ABNORMAL HIGH (ref 20.0–28.0)
Calcium, Ion: 1.15 mmol/L (ref 1.15–1.40)
HCT: 52 % (ref 39.0–52.0)
Hemoglobin: 17.7 g/dL — ABNORMAL HIGH (ref 13.0–17.0)
O2 Saturation: 77 %
Potassium: 3.9 mmol/L (ref 3.5–5.1)
Sodium: 136 mmol/L (ref 135–145)
TCO2: 37 mmol/L — ABNORMAL HIGH (ref 22–32)
pCO2, Ven: 62.3 mmHg — ABNORMAL HIGH (ref 44.0–60.0)
pH, Ven: 7.365 (ref 7.250–7.430)
pO2, Ven: 45 mmHg (ref 32.0–45.0)

## 2019-10-08 LAB — APTT: aPTT: 32 seconds (ref 24–36)

## 2019-10-08 LAB — TROPONIN I (HIGH SENSITIVITY): Troponin I (High Sensitivity): 66 ng/L — ABNORMAL HIGH (ref <18)

## 2019-10-08 LAB — LACTIC ACID, PLASMA: Lactic Acid, Venous: 1.7 mmol/L (ref 0.5–1.9)

## 2019-10-08 LAB — SARS CORONAVIRUS 2 BY RT PCR (HOSPITAL ORDER, PERFORMED IN ~~LOC~~ HOSPITAL LAB): SARS Coronavirus 2: NEGATIVE

## 2019-10-08 LAB — PROTIME-INR
INR: 1.2 (ref 0.8–1.2)
Prothrombin Time: 14.4 seconds (ref 11.4–15.2)

## 2019-10-08 LAB — BRAIN NATRIURETIC PEPTIDE: B Natriuretic Peptide: 1123.2 pg/mL — ABNORMAL HIGH (ref 0.0–100.0)

## 2019-10-08 LAB — CBG MONITORING, ED: Glucose-Capillary: 233 mg/dL — ABNORMAL HIGH (ref 70–99)

## 2019-10-08 MED ORDER — HEPARIN SODIUM (PORCINE) 5000 UNIT/ML IJ SOLN
5000.0000 [IU] | Freq: Three times a day (TID) | INTRAMUSCULAR | Status: DC
  Administered 2019-10-08 – 2019-10-17 (×23): 5000 [IU] via SUBCUTANEOUS
  Filled 2019-10-08 (×21): qty 1

## 2019-10-08 MED ORDER — LEVETIRACETAM IN NACL 500 MG/100ML IV SOLN
500.0000 mg | Freq: Every day | INTRAVENOUS | Status: DC
  Administered 2019-10-08 – 2019-10-12 (×5): 500 mg via INTRAVENOUS
  Filled 2019-10-08 (×6): qty 100

## 2019-10-08 MED ORDER — INSULIN ASPART 100 UNIT/ML ~~LOC~~ SOLN
0.0000 [IU] | Freq: Three times a day (TID) | SUBCUTANEOUS | Status: DC
  Administered 2019-10-09: 3 [IU] via SUBCUTANEOUS
  Administered 2019-10-10: 1 [IU] via SUBCUTANEOUS
  Administered 2019-10-10: 2 [IU] via SUBCUTANEOUS
  Administered 2019-10-10 – 2019-10-11 (×3): 1 [IU] via SUBCUTANEOUS
  Administered 2019-10-12 – 2019-10-14 (×3): 2 [IU] via SUBCUTANEOUS
  Administered 2019-10-15: 3 [IU] via SUBCUTANEOUS
  Administered 2019-10-15 – 2019-10-16 (×2): 1 [IU] via SUBCUTANEOUS
  Administered 2019-10-16 (×2): 2 [IU] via SUBCUTANEOUS
  Administered 2019-10-17: 1 [IU] via SUBCUTANEOUS
  Administered 2019-10-17: 2 [IU] via SUBCUTANEOUS

## 2019-10-08 MED ORDER — INSULIN ASPART 100 UNIT/ML ~~LOC~~ SOLN
0.0000 [IU] | Freq: Every day | SUBCUTANEOUS | Status: DC
  Administered 2019-10-08 – 2019-10-16 (×5): 2 [IU] via SUBCUTANEOUS

## 2019-10-08 MED ORDER — INSULIN GLARGINE 100 UNIT/ML ~~LOC~~ SOLN
10.0000 [IU] | Freq: Once | SUBCUTANEOUS | Status: AC
  Administered 2019-10-08: 10 [IU] via SUBCUTANEOUS
  Filled 2019-10-08: qty 0.1

## 2019-10-08 NOTE — Telephone Encounter (Signed)
Pt's wife calling. States pt returned home from HD an hour ago. States he had been vomiting all day in HD. Wife states pt "Has altered mental status." States "Talking crazy." BP 86/57  HR 82. States cannot get O2 sat on pt. Also reports BS 296. Directed to call 911. States she will do so now.  Reason for Disposition . Difficult to awaken or acting confused (e.g., disoriented, slurred speech)  Answer Assessment - Initial Assessment Questions 1. SYMPTOM: "What is the main symptom you are concerned about?" (e.g., weakness, numbness)   AMS  Low BP 2. ONSET: "When did this start?" (minutes, hours, days; while sleeping)     One hour ago 3. LAST NORMAL: "When was the last time you were normal (no symptoms)?"     *No Answer* 4. PATTERN "Does this come and go, or has it been constant since it started?"  "Is it present now?"     *No Answer* 5. CARDIAC SYMPTOMS: "Have you had any of the following symptoms: chest pain, difficulty breathing, palpitations?"     *No Answer* 6. NEUROLOGIC SYMPTOMS: "Have you had any of the following symptoms: headache, dizziness, vision loss, double vision, changes in speech, unsteady on your feet?"     *No Answer* 7. OTHER SYMPTOMS: "Do you have any other symptoms?"     *No Answer*  See triage summary  Protocols used: NEUROLOGIC DEFICIT-A-AH

## 2019-10-08 NOTE — ED Provider Notes (Signed)
[facility] EMERGENCY DEPARTMENT Provider Note   CSN: [id number] Arrival date & time: 10/08/19  1943     History Chief Complaint  Patient presents with  . Hypotensive    Howard Bell is a 54 y.o. male w PMHx significant for CHF, DM on insulin, ESRD on HD, HTN, obesity who presents via EMS for shortness of breath, confusion, syncope.  Patient underwent normal dialysis treatment prior to onset of symptoms.  Patient states once he got home he began to get short of breath, feel confused and had syncopal episode.  EMS reports patient was only oriented to self with SBP 80s, O2 in 60s% on room air.  Patient with improvement in blood pressure during transport, O2 100% with nonrebreather.  Patient endorses non-productive cough, shortness of breath without chest pain.  Patient denies fever, chills, abdominal pain. No hx of DVT/PE.  HPI     Past Medical History:  Diagnosis Date  . Arthritis   . Congestive heart failure (CHF) ([place])   . Depression   . 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 Diabetic gastroparesis ([place]) 11/06/2017  . ESRD (end stage renal disease) on dialysis [facility])    M,W.F dialysis  . H/O seasonal allergies   . Hypertension   . Pneumonia   . Sleep apnea    not using it now, on continuous O2 2 L during the day, 3 L during the night    Patient Active Problem List   Diagnosis Date Noted  . Hypotension 10/08/2019  . H/O stroke without residual deficits   . Acute on chronic combined systolic and diastolic CHF (congestive heart failure) ([place]) 08/18/2019  . Seizures ([place]) 08/16/2019  . Abnormality of gait 01/30/2019  . S/P AKA (above knee amputation) bilateral ([place]) 11/07/2018  . Chronic hypercapnic respiratory failure ([place]) 10/02/2018  . OSA (obstructive sleep apnea)   . S/P AKA (above knee amputation) unilateral, right ([place])   . Diabetic peripheral neuropathy ([place])   . Status post bilateral above knee amputation ([place])  09/08/2018  . Atherosclerosis of native arteries of extremities with gangrene, left leg ([place]) 09/05/2018  . Peripheral arterial disease ([place])   . Unilateral AKA, right ([place]) 08/17/2018  . Drug induced constipation   . Uncontrolled diabetes mellitus type 2 with peripheral artery disease ([place])   . Anemia of chronic disease   . Diabetes mellitus type 2 in nonobese (HCC)   . Supplemental oxygen dependent   . Unilateral AKA, left ([place]) 08/07/2018  . S/P BKA (below knee amputation), right ([place]) 08/04/2018  . Critical limb ischemia with history of revascularization of same extremity 05/09/2018  . 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 ([place]) 11/16/2017  . 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 ([place]) 11/16/2017  . CKD stage 5 due to type 2 diabetes mellitus ([place]) 11/16/2017  . Glaucoma due to type 2 diabetes mellitus ([place]) 11/16/2017  . Chronic generalized pain 11/16/2017  . Recurrent pneumonia 11/06/2017  . Diabetic gastroparesis ([place]) 11/06/2017  . Generalized abdominal pain 09/13/2017  . Pain in right shoulder 07/26/2017  . ESRD on dialysis ([place]) 03/28/2017  . Anemia due to end stage renal disease ([place]) 08/04/2016  . Elevated troponin     Past Surgical History:  Procedure Laterality Date  . AMPUTATION Right 05/14/2018   Procedure: AMPUTATION BELOW KNEE;  Surgeon: Newt Minion, MD;  Location: [facility];  Service: Orthopedics;  Laterality: Right;  .  AMPUTATION Right 08/04/2018   Procedure: RIGHT ABOVE KNEE AMPUTATION;  Surgeon: Newt Minion, MD;  Location: [facility];  Service: Orthopedics;  Laterality: Right;  . AMPUTATION Left 09/08/2018   Procedure: LEFT ABOVE KNEE AMPUTATION;  Surgeon: Newt Minion, MD;  Location: [facility];  Service: Orthopedics;  Laterality: Left;  . APPLICATION OF WOUND VAC Left 09/08/2018   Procedure: Application Of Wound Vac;  Surgeon: Newt Minion, MD;  Location: [facility];  Service: Orthopedics;   Laterality: Left;  . ESOPHAGOGASTRODUODENOSCOPY (EGD) WITH PROPOFOL N/A 12/05/2017   Procedure: ESOPHAGOGASTRODUODENOSCOPY (EGD) WITH PROPOFOL;  Surgeon: Doran Stabler, MD;  Location: [facility];  Service: Gastroenterology;  Laterality: N/A;  . HERNIA REPAIR    . IR AV DIALY SHUNT INTRO NEEDLE/INTRACATH INITIAL W/PTA/IMG LEFT  03/30/2017       Family History  Problem Relation Age of Onset  . Diabetes Mother   . Hypertension Father     Social History   Tobacco Use  . Smoking status: Never Smoker  . Smokeless tobacco: Never Used  Vaping Use  . Vaping Use: Never used  Substance Use Topics  . Alcohol use: No  . Drug use: No    Home Medications Prior to Admission medications   Medication Sig Start Date End Date Taking? Authorizing Provider  acetaminophen (TYLENOL) 325 MG tablet Take 1-2 tablets (325-650 mg total) by mouth every 4 (four) hours as needed for mild pain. 09/26/18  Yes Love, Ivan Anchors, PA-C  albuterol (PROVENTIL HFA;VENTOLIN HFA) 108 (90 Base) MCG/ACT inhaler Inhale 1 puff into the lungs every 4 (four) hours as needed for wheezing or shortness of breath.    Yes [provider]  calcium acetate (PHOSLO) 667 MG capsule Take 2,668 mg by mouth 3 (three) times daily with meals.   Yes [provider]  carvedilol (COREG) 6.25 MG tablet Take 1 tablet (6.25 mg total) by mouth 2 (two) times daily with a meal. 08/19/19  Yes Thurnell Lose, MD  docusate sodium (COLACE) 100 MG capsule Take 1 capsule (100 mg total) by mouth 2 (two) times daily. 01/22/19  Yes Sagardia, Ines Bloomer, MD  ferric citrate (AURYXIA) 1 GM 210 MG(Fe) tablet Take 1 tablet (210 mg total) by mouth 3 (three) times daily with meals. 09/29/18  Yes Love, Ivan Anchors, PA-C  fluticasone (FLONASE) 50 MCG/ACT nasal spray Place 1 spray into both nostrils daily as needed for allergies.    Yes [provider]  Insulin NPH, Human,, Isophane, (HUMULIN N KWIKPEN) 100 UNIT/ML Kiwkpen Inject 14 Units into  the skin every morning. And pen needles 1/day Patient taking differently: Inject 0-4 Units into the skin every morning. And pen needles 1/day 09/11/19  Yes Renato Shin, MD  isosorbide-hydrALAZINE (BIDIL) 20-37.5 MG tablet Take 1 tablet by mouth 3 (three) times daily. 08/19/19  Yes Thurnell Lose, MD  latanoprost (XALATAN) 0.005 % ophthalmic solution Place 1 drop into both eyes at bedtime.   Yes [provider]  levETIRAcetam (KEPPRA) 500 MG tablet Take 1 tablet (500 mg total) by mouth at bedtime. 08/19/19  Yes Thurnell Lose, MD  levothyroxine (SYNTHROID) 50 MCG tablet Take 1 tablet (50 mcg total) by mouth daily. 09/12/19  Yes Renato Shin, MD  metoCLOPramide (REGLAN) 5 MG tablet Take 1 tablet (5 mg total) by mouth 3 (three) times daily before meals. Patient taking differently: Take 5 mg by mouth 2 (two) times daily before a meal.  08/30/18  Yes Love, Ivan Anchors, PA-C  multivitamin (  RENA-VIT) TABS tablet Take 1 tablet by mouth at bedtime. 01/22/19  Yes Sagardia, Ines Bloomer, MD  omeprazole (PRILOSEC) 20 MG capsule Take 1 capsule (20 mg total) by mouth at bedtime. 01/22/19  Yes Sagardia, Ines Bloomer, MD  timolol (BETIMOL) 0.5 % ophthalmic solution Place 1 drop into both eyes 2 (two) times daily. 05/07/17  Yes English, Colletta [place] D, PA  glucose blood test strip 1 each by Other route as needed for other. Use as instructed 10/31/18   Horald Pollen, MD  Lancets (ACCU-CHEK SOFT [facility]) lancets Use as instructed 10/31/18   Horald Pollen, MD    Allergies    Gabapentin  Review of Systems   Review of Systems  Constitutional: Negative for appetite change, chills and fever.  HENT: Negative for congestion, rhinorrhea and sore throat.   Eyes: Negative for redness and visual disturbance.  Respiratory: Positive for cough and shortness of breath.   Cardiovascular: Negative for chest pain.  Gastrointestinal: Negative for abdominal pain, nausea and vomiting.  Genitourinary: Negative.         Patient no longer makes urine.  Musculoskeletal: Negative for myalgias.  Skin: Negative for rash and wound.  Neurological: Positive for syncope, light-headedness and headaches. Negative for facial asymmetry.  Psychiatric/Behavioral: Positive for confusion.    Physical Exam Updated Vital Signs BP 139/84   Pulse (!) 103   Temp 99.4 F (37.4 C) (Rectal)   Resp 13   Ht 6' (1.829 m)   Wt 91 kg   SpO2 100%   BMI 27.21 kg/m   Physical Exam Vitals and nursing note reviewed.  Constitutional:      General: He is in acute distress.     Appearance: He is obese. He is ill-appearing. He is not toxic-appearing or diaphoretic.     Comments: Pt hypoxic with O2 80s%  HENT:     Head: Normocephalic and atraumatic.     Nose: Nose normal.     Mouth/Throat:     Mouth: Mucous membranes are moist.  Eyes:     Extraocular Movements: Extraocular movements intact.     Conjunctiva/sclera: Conjunctivae normal.  Cardiovascular:     Rate and Rhythm: Regular rhythm. Tachycardia present.     Heart sounds: Normal heart sounds. No murmur heard.   Pulmonary:     Effort: Tachypnea and respiratory distress present.     Breath sounds: Examination of the right-middle field reveals rhonchi. Examination of the left-middle field reveals rhonchi. Examination of the right-lower field reveals rhonchi. Examination of the left-lower field reveals rhonchi. Rhonchi present. No wheezing.     Comments: R > L rhonchi  Abdominal:     Palpations: Abdomen is soft.     Tenderness: There is no abdominal tenderness. There is no guarding or rebound.  Skin:    General: Skin is warm.     Findings: No rash.  Neurological:     Mental Status: He is alert and oriented to person, place, and time.     Cranial Nerves: No cranial nerve deficit.  Psychiatric:        Mood and Affect: Mood normal.        Behavior: Behavior normal.     ED Results / Procedures / Treatments   Labs (all labs ordered are listed, but only abnormal  results are displayed) Labs Reviewed  COMPREHENSIVE METABOLIC PANEL - Abnormal; Notable for the following components:      Result Value   Chloride 91 (*)    Glucose, Bld 309 (*)  BUN 27 (*)    Creatinine, Ser 8.54 (*)    Albumin 3.4 (*)    Total Bilirubin 1.3 (*)    GFR calc non Af Amer 6 (*)    GFR calc Af Amer 7 (*)    All other components within normal limits  CBC WITH DIFFERENTIAL/PLATELET - Abnormal; Notable for the following components:   MCV 100.8 (*)    RDW 18.6 (*)    Platelets 124 (*)    nRBC 0.4 (*)    All other components within normal limits  BRAIN NATRIURETIC PEPTIDE - Abnormal; Notable for the following components:   B Natriuretic Peptide 1,123.2 (*)    All other components within normal limits  I-STAT VENOUS BLOOD GAS, ED - Abnormal; Notable for the following components:   pCO2, Ven 62.3 (*)    Bicarbonate 35.6 (*)    TCO2 37 (*)    Acid-Base Excess 7.0 (*)    Hemoglobin 17.7 (*)    All other components within normal limits  CBG MONITORING, ED - Abnormal; Notable for the following components:   Glucose-Capillary 233 (*)    All other components within normal limits  TROPONIN I (HIGH SENSITIVITY) - Abnormal; Notable for the following components:   Troponin I (High Sensitivity) 66 (*)    All other components within normal limits  SARS CORONAVIRUS 2 BY RT PCR (HOSPITAL ORDER, [facility])  CULTURE, BLOOD (ROUTINE X 2)  CULTURE, BLOOD (ROUTINE X 2)  LACTIC ACID, PLASMA  APTT  PROTIME-INR  BASIC METABOLIC PANEL  CBC  TROPONIN I (HIGH SENSITIVITY)    EKG EKG Interpretation  Date/Time:  Monday October 08 2019 20:17:04 EDT Ventricular Rate:  103 PR Interval:    QRS Duration: 118 QT Interval:  376 QTC Calculation: 493 R Axis:   -69 Text Interpretation: Sinus tachycardia Borderline prolonged PR interval Incomplete left bundle branch block LVH with secondary repolarization abnormality Confirmed by Lennice Sites [phone]) on 10/08/2019  8:28:14 PM   Radiology DG Chest Port 1 View  Result Date: 10/08/2019 CLINICAL DATA:  Shortness of breath. EXAM: PORTABLE CHEST 1 VIEW COMPARISON:  Aug 16, 2019 FINDINGS: Moderate severity right-sided perihilar infiltrate is seen. A small pleural effusion is also noted along the lateral aspect of the mid and lower right lung. No pneumothorax is seen. A radiopaque vascular stent is seen overlying the left apex. This is present on the prior study. The cardiac silhouette is moderately enlarged and unchanged in size. The visualized skeletal structures are unremarkable. IMPRESSION: 1. Moderate severity right-sided perihilar infiltrate. 2. Small pleural effusion along the lateral aspect of the mid and lower right lung. Electronically Signed   By: Virgina Norfolk M.D.   On: 10/08/2019 20:35    Procedures Procedures (including critical care time)  Medications Ordered in ED Medications  heparin injection 5,000 Units (5,000 Units Subcutaneous Given 10/08/19 2345)  insulin aspart (novoLOG) injection 0-9 Units (has no administration in time range)  insulin aspart (novoLOG) injection 0-5 Units (2 Units Subcutaneous Given 10/08/19 2345)  levETIRAcetam (KEPPRA) IVPB 500 mg/100 mL premix (500 mg Intravenous New Bag/Given 10/08/19 2343)  insulin glargine (LANTUS) injection 10 Units (10 Units Subcutaneous Given 10/08/19 2346)    ED Course  I have reviewed the triage vital signs and the nursing notes.  Pertinent labs & imaging results that were available during my care of the patient were reviewed by me and considered in my medical decision making (see chart for details).    MDM Rules/Calculators/A&P  Pt is a 54 yo M w hx of CHF, DM on insulin, ESRD on HD, HTN, obesity who presents via EMS for confusion, syncopal episode and found to be hypotensive with SBP 80s, hypoxic requiring non-rebreather.  Patient endorsed feeling confused however now feels like he is thinking clearly.  Patient  states he still does not feel like he is at baseline.  Patient endorses having full dialysis session earlier today, does not feel like they pulled too much fluid off.  Patient denies fever, however has productive cough.  Patient denies chest pain, back pain, abdominal pain.  Wife at bedside states that patient reported earlier that he had multiple episodes of emesis during dialysis treatment today.  On arrival patient afebrile, tachycardic to HR 103, with stable blood pressure SBP 130.  On exam patient with crackles to bilateral bases to mid lungs bilaterally.  No cardiac murmur appreciated.  Abdomen soft, nontender, nondistended.  Patient with bilateral AKA.  Patient alert, oriented x4 however still appeared somewhat confused about presenting symptoms.  Initial chest x-ray concerning for moderate right-sided perihilar infiltrate with small pleural effusion.  Community-acquired pneumonia considered in the setting of chest x-ray as cause for presentation however patient afebrile, no leukocytosis on CBC.  Clinical picture, chest x-ray, labs consistent with likely heart failure exacerbation.  BNP 1,123.  Echo completed 2 months prior during last hospitalization significant for LVEF of 25-30%.  Initial troponin 66, patient without chest pain, no abnormalities concerning for acute ischemia on EKG likely not concerning for ACS at this time.  VBG was collected with pH 7.365, PCO2 62.3, PO2 45.  Patient admitted to hospitalist service for further work-up and management of CHF exacerbation.  Case was discussed with inpatient physician, decision to hold on antibiotics at this time as CHF more likely than CAP as cause of presentation.  Patient admitted.  Final Clinical Impression(s) / ED Diagnoses Final diagnoses:  Acute on chronic congestive heart failure, unspecified heart failure type [facility])    Rx / DC Orders ED Discharge Orders    None       Kennyth Lose, MD 10/09/19 0017    Lennice Sites, DO 10/09/19  0101

## 2019-10-08 NOTE — ED Provider Notes (Addendum)
I have personally seen and examined the patient. I have reviewed the documentation on PMH/FH/Soc Hx. I have discussed the plan of care with the resident and patient.  I have reviewed and agree with the resident's documentation. Please see associated encounter note.  Briefly, the patient is a 54 y.o. male here with hypotension and hypoxia.  History of renal disease on hemodialysis.  History of heart failure.  Finished dialysis today.  Went home and had mental status change.  Was found to be hypoxic and hypotensive with EMS.  On 2 L he is on room air.  Blood pressure is normal upon arrival here.  Has had some vomiting since leaving dialysis.  Denies any abdominal pain.  Lab work overall appears unremarkable.  No significant anemia or electrolyte abnormality.  Chest x-ray concerning for possible fluid versus infection.  Likely multifactorial hypoxia.  Will discuss antibiotics with admitting team could be infectious process versus volume overload.  Admitted in stable condition.  Lab work not consistent with sepsis. Will def antibioitcs to primary team.  This chart was dictated using voice recognition software.  Despite best efforts to proofread,  errors can occur which can change the documentation meaning.   .Critical Care Performed by: Lennice Sites, DO Authorized by: Lennice Sites, DO   Critical care provider statement:    Critical care time (minutes):  35   Critical care was necessary to treat or prevent imminent or life-threatening deterioration of the following conditions:  Respiratory failure   Critical care was time spent personally by me on the following activities:  Blood draw for specimens, development of treatment plan with patient or surrogate, discussions with consultants, evaluation of patient's response to treatment, examination of patient, obtaining history from patient or surrogate, ordering and review of laboratory studies, ordering and performing treatments and interventions, pulse  oximetry, re-evaluation of patient's condition, ordering and review of radiographic studies and review of old charts   I assumed direction of critical care for this patient from another provider in my specialty: no       EKG Interpretation  Date/Time:  Monday October 08 2019 20:17:04 EDT Ventricular Rate:  103 PR Interval:    QRS Duration: 118 QT Interval:  376 QTC Calculation: 493 R Axis:   -69 Text Interpretation: Sinus tachycardia Borderline prolonged PR interval Incomplete left bundle branch block LVH with secondary repolarization abnormality Confirmed by Lennice Sites [phone]) on 10/08/2019 8:28:14 PM         Lennice Sites, DO 10/08/19 [place], [place], DO 10/08/19 2228

## 2019-10-08 NOTE — H&P (Signed)
History and Physical    Kelcey Wickstrom DPO:[id number] DOB: December 04, 1965 DOA: 10/08/2019  PCP: Horald Pollen, MD  Patient coming from: Home  I have personally briefly reviewed patient's old medical records in [place]  Chief Complaint: AMS, hypotension  HPI: Howard Bell is a 54 y.o. 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   Pt was lethargic and drowsy during exam and was able to only tell me that he felt bad after dialysis. I spoke with wife over the phone and she said he came home from dialysis today and told her that he has been having nausea and vomiting all day. He was notably more weak transferring from his wheelchair which is unlike him. Also started to "talk funny" and saying things that don't make sense. Wife check his BP and it was 85/57, BG 292 and temperature of 97.5.  He has been having a cough and chills this morning but this has been ongoing for the past 2 months.   ED Course: He was afebrile, tachycardic and mildly tachypneic but had improvement in his blood pressure up to 130 over 80s without any intervention.  He was placed on 2 L via nasal cannula but did not have any documented hypoxia in the ED.  Review of Systems:  Unable to fully obtain given patient's lethargy and drowsiness  Past Medical History:  Diagnosis Date  . Arthritis   . Congestive heart failure (CHF) ([place])   . Depression   . 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 Diabetic gastroparesis ([place]) 11/06/2017  . ESRD (end stage renal disease) on dialysis [facility])    M,W.F dialysis  . H/O seasonal allergies   . Hypertension   . Pneumonia   . Sleep apnea    not using it now, on continuous O2 2 L during the day, 3 L during the night    Past Surgical  History:  Procedure Laterality Date  . AMPUTATION Right 05/14/2018   Procedure: AMPUTATION BELOW KNEE;  Surgeon: Newt Minion, MD;  Location: [place];  Service: Orthopedics;  Laterality: Right;  . AMPUTATION Right 08/04/2018   Procedure: RIGHT ABOVE KNEE AMPUTATION;  Surgeon: Newt Minion, MD;  Location: [place];  Service: Orthopedics;  Laterality: Right;  . AMPUTATION Left 09/08/2018   Procedure: LEFT ABOVE KNEE AMPUTATION;  Surgeon: Newt Minion, MD;  Location: [place];  Service: Orthopedics;  Laterality: Left;  . APPLICATION OF WOUND VAC Left 09/08/2018   Procedure: Application Of Wound Vac;  Surgeon: Newt Minion, MD;  Location: [place];  Service: Orthopedics;  Laterality: Left;  . ESOPHAGOGASTRODUODENOSCOPY (EGD) WITH PROPOFOL N/A 12/05/2017   Procedure: ESOPHAGOGASTRODUODENOSCOPY (EGD) WITH PROPOFOL;  Surgeon: Doran Stabler, MD;  [facility];  Service: Gastroenterology;  Laterality: N/A;  . HERNIA REPAIR    . IR AV DIALY SHUNT INTRO NEEDLE/INTRACATH INITIAL W/PTA/IMG LEFT  03/30/2017     reports that he has never smoked. He has never used smokeless tobacco. He reports that he does not drink alcohol and does not use drugs.  Allergies  Allergen Reactions  . Gabapentin Other (See Comments)    Altered mental status    Family History  Problem Relation Age of Onset  . Diabetes Mother   . Hypertension Father  Prior to Admission medications   Medication Sig Start Date End Date Taking? Authorizing Provider  acetaminophen (TYLENOL) 325 MG tablet Take 1-2 tablets (325-650 mg total) by mouth every 4 (four) hours as needed for mild pain. 09/26/18   Love, Ivan Anchors, PA-C  albuterol (PROVENTIL HFA;VENTOLIN HFA) 108 (90 Base) MCG/ACT inhaler Inhale 1 puff into the lungs every 4 (four) hours as needed for wheezing or shortness of breath.     [provider]  carvedilol (COREG) 6.25 MG tablet Take 1 tablet (6.25 mg total) by mouth 2 (two) times daily with a meal. 08/19/19    Thurnell Lose, MD  docusate sodium (COLACE) 100 MG capsule Take 1 capsule (100 mg total) by mouth 2 (two) times daily. 01/22/19   Horald Pollen, MD  ferric citrate (AURYXIA) 1 GM 210 MG(Fe) tablet Take 1 tablet (210 mg total) by mouth 3 (three) times daily with meals. 09/29/18   Love, Ivan Anchors, PA-C  fluticasone (FLONASE) 50 MCG/ACT nasal spray Place 1 spray into both nostrils daily as needed for allergies.     [provider]  glucose blood test strip 1 each by Other route as needed for other. Use as instructed 10/31/18   Horald Pollen, MD  Insulin NPH, Human,, Isophane, (HUMULIN N KWIKPEN) 100 UNIT/ML Kiwkpen Inject 14 Units into the skin every morning. And pen needles 1/day 09/11/19   Renato Shin, MD  isosorbide-hydrALAZINE (BIDIL) 20-37.5 MG tablet Take 1 tablet by mouth 3 (three) times daily. 08/19/19   Thurnell Lose, MD  Lancets (ACCU-CHEK SOFT [facility] lancets Use as instructed 10/31/18   Horald Pollen, MD  latanoprost (XALATAN) 0.005 % ophthalmic solution Place 1 drop into both eyes at bedtime.    [provider]  levETIRAcetam (KEPPRA) 500 MG tablet Take 1 tablet (500 mg total) by mouth at bedtime. 08/19/19   Thurnell Lose, MD  levothyroxine (SYNTHROID) 50 MCG tablet Take 1 tablet (50 mcg total) by mouth daily. 09/12/19   Renato Shin, MD  metoCLOPramide (REGLAN) 5 MG tablet Take 1 tablet (5 mg total) by mouth 3 (three) times daily before meals. Patient taking differently: Take 5 mg by mouth 2 (two) times daily before a meal.  08/30/18   Love, Ivan Anchors, PA-C  multivitamin (RENA-VIT) TABS tablet Take 1 tablet by mouth at bedtime. 01/22/19   Horald Pollen, MD  omeprazole (PRILOSEC) 20 MG capsule Take 1 capsule (20 mg total) by mouth at bedtime. 01/22/19   Horald Pollen, MD  timolol (BETIMOL) 0.5 % ophthalmic solution Place 1 drop into both eyes 2 (two) times daily. 05/07/17   Ivar Drape D, PA    Physical Exam: Vitals:    10/08/19 2024 10/08/19 2030 10/08/19 2045 10/08/19 2105  BP:  126/89 (!) 153/104   Pulse: (!) 102 (!) 102 (!) 122 (!) 101  Resp: (!) 21 13 (!) 25 17  Temp:      TempSrc:      SpO2: 100% 100% 100% 100%  Weight:      Height:        Constitutional: NAD, calm, nontoxic appearing lethargic/drowsy male laying flat in bed Vitals:   10/08/19 2024 10/08/19 2030 10/08/19 2045 10/08/19 2105  BP:  126/89 (!) 153/104   Pulse: (!) 102 (!) 102 (!) 122 (!) 101  Resp: (!) 21 13 (!) 25 17  Temp:      TempSrc:      SpO2: 100% 100% 100% 100%  Weight:  Height:       Eyes: PERRL, lids and conjunctivae normal ENMT: Mucous membranes are moist.  Neck: normal, supple, no masses, no thyromegaly Respiratory: clear to auscultation bilaterally, no wheezing, no crackles. Normal respiratory effort on 2 L of O2. No accessory muscle use.  Cardiovascular: Regular rate and rhythm, no murmurs / rubs / gallops.  Bilateral BKA without any edema.   Abdomen: no tenderness, no masses palpated.  Bowel sounds positive.  Musculoskeletal: no clubbing / cyanosis.  Bilateral BKA.  Left upper extremity AV fistula with no bruits. Skin: no rashes, lesions, ulcers. No induration Neurologic: Patient awoke easily to voice but would easily drift back to sleep.  Able to answer some questions and follow simple commands. Psychiatric: Lethargic.    Labs on Admission: I have personally reviewed following labs and imaging studies  CBC: Recent Labs  Lab 10/08/19 2001 10/08/19 2008  WBC 6.8  --   NEUTROABS 4.7  --   HGB 15.7 17.7*  HCT 50.2 52.0  MCV 100.8*  --   PLT 124*  --    Basic Metabolic Panel: Recent Labs  Lab 10/08/19 2001 10/08/19 2008  NA 135 136  K 3.8 3.9  CL 91*  --   CO2 30  --   GLUCOSE 309*  --   BUN 27*  --   CREATININE 8.54*  --   CALCIUM 9.7  --    GFR: Estimated Creatinine Clearance: 11.1 mL/min (A) (by C-G formula based on SCr of 8.54 mg/dL (H)). Liver Function Tests: Recent Labs  Lab  10/08/19 2001  AST 22  ALT 18  ALKPHOS 80  BILITOT 1.3*  PROT 7.1  ALBUMIN 3.4*   No results for input(s): LIPASE, AMYLASE in the last 168 hours. No results for input(s): AMMONIA in the last 168 hours. Coagulation Profile: Recent Labs  Lab 10/08/19 2001  INR 1.2   Cardiac Enzymes: No results for input(s): CKTOTAL, CKMB, CKMBINDEX, TROPONINI in the last 168 hours. BNP (last 3 results) No results for input(s): PROBNP in the last 8760 hours. HbA1C: No results for input(s): HGBA1C in the last 72 hours. CBG: No results for input(s): GLUCAP in the last 168 hours. Lipid Profile: No results for input(s): CHOL, HDL, LDLCALC, TRIG, CHOLHDL, LDLDIRECT in the last 72 hours. Thyroid Function Tests: No results for input(s): TSH, T4TOTAL, FREET4, T3FREE, THYROIDAB in the last 72 hours. Anemia Panel: No results for input(s): VITAMINB12, FOLATE, FERRITIN, TIBC, IRON, RETICCTPCT in the last 72 hours. Urine analysis:    Component Value Date/Time   COLORURINE RED (A) 11/19/2016 0020   APPEARANCEUR TURBID (A) 11/19/2016 0020   LABSPEC  11/19/2016 0020    TEST NOT REPORTED DUE TO COLOR INTERFERENCE OF URINE PIGMENT   PHURINE  11/19/2016 0020    TEST NOT REPORTED DUE TO COLOR INTERFERENCE OF URINE PIGMENT   GLUCOSEU (A) 11/19/2016 0020    TEST NOT REPORTED DUE TO COLOR INTERFERENCE OF URINE PIGMENT   HGBUR (A) 11/19/2016 0020    TEST NOT REPORTED DUE TO COLOR INTERFERENCE OF URINE PIGMENT   BILIRUBINUR (A) 11/19/2016 0020    TEST NOT REPORTED DUE TO COLOR INTERFERENCE OF URINE PIGMENT   KETONESUR (A) 11/19/2016 0020    TEST NOT REPORTED DUE TO COLOR INTERFERENCE OF URINE PIGMENT   PROTEINUR (A) 11/19/2016 0020    TEST NOT REPORTED DUE TO COLOR INTERFERENCE OF URINE PIGMENT   NITRITE (A) 11/19/2016 0020    TEST NOT REPORTED DUE TO COLOR INTERFERENCE OF URINE PIGMENT  LEUKOCYTESUR (A) 11/19/2016 0020    TEST NOT REPORTED DUE TO COLOR INTERFERENCE OF URINE PIGMENT    Radiological Exams  on Admission: DG Chest Port 1 View  Result Date: 10/08/2019 CLINICAL DATA:  Shortness of breath. EXAM: PORTABLE CHEST 1 VIEW COMPARISON:  Aug 16, 2019 FINDINGS: Moderate severity right-sided perihilar infiltrate is seen. A small pleural effusion is also noted along the lateral aspect of the mid and lower right lung. No pneumothorax is seen. A radiopaque vascular stent is seen overlying the left apex. This is present on the prior study. The cardiac silhouette is moderately enlarged and unchanged in size. The visualized skeletal structures are unremarkable. IMPRESSION: 1. Moderate severity right-sided perihilar infiltrate. 2. Small pleural effusion along the lateral aspect of the mid and lower right lung. Electronically Signed   By: Virgina Norfolk M.D.   On: 10/08/2019 20:35      Assessment/Plan  Hypotension in the setting of ESRD on hemodialysis Patient had notable hypotension following hemodialysis today with nausea and vomiting.  Suspect could be due to hypovolemia following dialysis. No signs of clear infection.  Patient is afebrile without leukocytosis.  He has been having a cough but this is chronic for 2 months so low suspicion for pneumonia. Blood pressure has normalized without intervention.  We will continue to monitor overnight.  Patient is anuric. Hold all antihypertensives  Combined systolic and diastolic heart failure Last echo in May showed EF of 25% BMP of 1123 on admission.  Back in May was 1239. Chest x-ray also shows right perihilar infiltrate with some small pleural effusions. We will repeat echo  Elevated troponin Likely demand ischemia.  Will continue to trend overnight.  Questionable hypoxia Patient placed on 2 L without any documented hypoxia.  Although reportedly was 68% on room air when checked by EMS. Wife does report that he has O2 at home to be used as needed  History of seizures Continue antiepileptics  ESRD on HD HD MWF Patient is  anuric  OSA Continue CPAP  Type 2 diabetes with history of bilateral BKA Placed on sliding scale.  Last hemoglobin A1c of 10.7 in May.   DVT prophylaxis:.Heparin Code Status: Full Family Communication: Plan discussed with patient at bedside and with wife over the phone disposition Plan: Home with observation Consults called:  Admission status: Observation  Status is: Observation  The patient remains OBS appropriate and will d/c before 2 midnights.  Dispo: The patient is from: Home              Anticipated d/c is to: Home              Anticipated d/c date is: 1 day              Patient currently is not medically stable to d/c.         Orene Desanctis DO [facility]   If 7PM-7AM, please contact night-coverage [email or web address]   10/08/2019, 10:44 PM

## 2019-10-08 NOTE — ED Triage Notes (Signed)
Pt presents to ED from home BIB [facility]. Pt c/o sudden onset SOB and AMS. Pt reports that he went tp dialysis today w/ normal treatment. Reports that when he got home he had AMS and suddenly got SOB. Upon EMS arrival pt'sSBP 80's and O2 68% on RA. Pt placed on NRB w/ O2 100%. Pt AAO x4 upon arrival

## 2019-10-08 NOTE — ED Notes (Addendum)
Pt doe snot produce urine and cannot provide urine specimen EDP aware

## 2019-10-08 NOTE — Progress Notes (Deleted)
Cardiology Clinic Note   Patient Name: Howard Bell Date of Encounter: 10/08/2019  Primary Care Provider:  Horald Pollen, MD Primary Cardiologist:  Quay Burow, MD  Patient Profile    Howard Bell 54 year old male presents the clinic today for follow-up evaluation of his HTN, chronic systolic CHF, critical limb ischemia, type 2 diabetes, and PAD.  Past Medical History    Past Medical History:  Diagnosis Date  . Arthritis   . Congestive heart failure (CHF) ([place])   . Depression   . 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 Diabetic gastroparesis ([place]) 11/06/2017  . ESRD (end stage renal disease) on dialysis [facility])    M,W.F dialysis  . H/O seasonal allergies   . Hypertension   . Pneumonia   . Sleep apnea    not using it now, on continuous O2 2 L during the day, 3 L during the night   Past Surgical History:  Procedure Laterality Date  . AMPUTATION Right 05/14/2018   Procedure: AMPUTATION BELOW KNEE;  Surgeon: Newt Minion, MD;  Location: [place];  Service: Orthopedics;  Laterality: Right;  . AMPUTATION Right 08/04/2018   Procedure: RIGHT ABOVE KNEE AMPUTATION;  Surgeon: Newt Minion, MD;  Location: [place];  Service: Orthopedics;  Laterality: Right;  . AMPUTATION Left 09/08/2018   Procedure: LEFT ABOVE KNEE AMPUTATION;  Surgeon: Newt Minion, MD;  Location: [place];  Service: Orthopedics;  Laterality: Left;  . APPLICATION OF WOUND VAC Left 09/08/2018   Procedure: Application Of Wound Vac;  Surgeon: Newt Minion, MD;  Location: [place];  Service: Orthopedics;  Laterality: Left;  . ESOPHAGOGASTRODUODENOSCOPY (EGD) WITH PROPOFOL N/A 12/05/2017   Procedure: ESOPHAGOGASTRODUODENOSCOPY (EGD) WITH PROPOFOL;  Surgeon: Doran Stabler, MD;  [facility];  Service: Gastroenterology;  Laterality: N/A;  . HERNIA REPAIR    . IR AV DIALY SHUNT INTRO NEEDLE/INTRACATH INITIAL W/PTA/IMG LEFT  03/30/2017    Allergies  Allergies    Allergen Reactions  . Gabapentin Other (See Comments)    Altered mental status    History of Present Illness    Mr Howard Bell has a PMH of hypertension, CKD stage V, acute on chronic systolic CHF, critical limb ischemia with history of revascularization, diabetes mellitus type 2, PAD, hypotension, OSA, chronic generalized pain, and left AKA.  His last seen by Dr. Gwenlyn Found on 11/07/2018.  It was noted at that time that he had been on HD for 9 years.  He had a remote left foot wound that had healed with aggressive wound care.  His risk factors include treated hypertension, diabetes, and HLD.  There is question of remote MI and CVA.  He had a cardiac catheterization performed in [facility] 10 years prior which was normal.  He had a AKA related to nonhealing ulcer and critical limb ischemia that was performed by Dr.Dudas on 09/08/2018.  During that time he was being evaluated for bilateral lower extremity prosthesis.  His echocardiogram showed normal LV function with diastolic dysfunction.  He denied shortness of breath, and chest pain.  He presents to the clinic today for follow-up evaluation and states***  *** denies chest pain, shortness of breath, lower extremity edema, fatigue, palpitations, melena, hematuria, hemoptysis, diaphoresis, weakness, presyncope, syncope, orthopnea, and PND.   Home Medications    Prior to Admission medications   Medication Sig Start Date End Date Taking? Authorizing Provider  acetaminophen (TYLENOL) 325 MG tablet Take 1-2 tablets (325-650 mg total) by  mouth every 4 (four) hours as needed for mild pain. 09/26/18   Love, Ivan Anchors, PA-C  albuterol (PROVENTIL HFA;VENTOLIN HFA) 108 (90 Base) MCG/ACT inhaler Inhale 1 puff into the lungs every 4 (four) hours as needed for wheezing or shortness of breath.     [provider]  carvedilol (COREG) 6.25 MG tablet Take 1 tablet (6.25 mg total) by mouth 2 (two) times daily with a meal. 08/19/19   Thurnell Lose, MD  docusate sodium (COLACE) 100 MG capsule Take 1 capsule (100 mg total) by mouth 2 (two) times daily. 01/22/19   Horald Pollen, MD  ferric citrate (AURYXIA) 1 GM 210 MG(Fe) tablet Take 1 tablet (210 mg total) by mouth 3 (three) times daily with meals. 09/29/18   Love, Ivan Anchors, PA-C  fluticasone (FLONASE) 50 MCG/ACT nasal spray Place 1 spray into both nostrils daily as needed for allergies.     [provider]  glucose blood test strip 1 each by Other route as needed for other. Use as instructed 10/31/18   Horald Pollen, MD  Insulin NPH, Human,, Isophane, (HUMULIN N KWIKPEN) 100 UNIT/ML Kiwkpen Inject 14 Units into the skin every morning. And pen needles 1/day 09/11/19   Renato Shin, MD  isosorbide-hydrALAZINE (BIDIL) 20-37.5 MG tablet Take 1 tablet by mouth 3 (three) times daily. 08/19/19   Thurnell Lose, MD  Lancets (ACCU-CHEK SOFT [facility]) lancets Use as instructed 10/31/18   Horald Pollen, MD  latanoprost (XALATAN) 0.005 % ophthalmic solution Place 1 drop into both eyes at bedtime.    [provider]  levETIRAcetam (KEPPRA) 500 MG tablet Take 1 tablet (500 mg total) by mouth at bedtime. 08/19/19   Thurnell Lose, MD  levothyroxine (SYNTHROID) 50 MCG tablet Take 1 tablet (50 mcg total) by mouth daily. 09/12/19   Renato Shin, MD  metoCLOPramide (REGLAN) 5 MG tablet Take 1 tablet (5 mg total) by mouth 3 (three) times daily before meals. Patient taking differently: Take 5 mg by mouth 2 (two) times daily before a meal.  08/30/18   Love, Ivan Anchors, PA-C  multivitamin (RENA-VIT) TABS tablet Take 1 tablet by mouth at bedtime. 01/22/19   Horald Pollen, MD  omeprazole (PRILOSEC) 20 MG capsule Take 1 capsule (20 mg total) by mouth at bedtime. 01/22/19   Horald Pollen, MD  timolol (BETIMOL) 0.5 % ophthalmic solution Place 1 drop into both eyes 2 (two) times daily. 05/07/17   Joretta Bachelor, PA    Family History    Family History  Problem  Relation Age of Onset  . Diabetes Mother   . Hypertension Father    He indicated that his mother is deceased. He indicated that his father is alive. He indicated that his brother is alive.  Social History    Social History   Socioeconomic History  . Marital status: Married    Spouse name: Not on file  . Number of children: Not on file  . Years of education: Not on file  . Highest education level: Not on file  Occupational History  . Not on file  Tobacco Use  . Smoking status: Never Smoker  . Smokeless tobacco: Never Used  Vaping Use  . Vaping Use: Never used  Substance and Sexual Activity  . Alcohol use: No  . Drug use: No  . Sexual activity: Not on file  Other Topics Concern  . Not on file  Social History Narrative  . Not on file  Social Determinants of Health   Financial Resource Strain:   . Difficulty of Paying Living Expenses:   Food Insecurity:   . Worried About Charity fundraiser in the Last Year:   . Arboriculturist in the Last Year:   Transportation Needs:   . Film/video editor (Medical):   Marland Kitchen Lack of Transportation (Non-Medical):   Physical Activity:   . Days of Exercise per Week:   . Minutes of Exercise per Session:   Stress:   . Feeling of Stress :   Social Connections:   . Frequency of Communication with Friends and Family:   . Frequency of Social Gatherings with Friends and Family:   . Attends Religious Services:   . Active Member of Clubs or Organizations:   . Attends Archivist Meetings:   Marland Kitchen Marital Status:   Intimate Partner Violence:   . Fear of Current or Ex-Partner:   . Emotionally Abused:   Marland Kitchen Physically Abused:   . Sexually Abused:      Review of Systems    General:  No chills, fever, night sweats or weight changes.  Cardiovascular:  No chest pain, dyspnea on exertion, edema, orthopnea, palpitations, paroxysmal nocturnal dyspnea. Dermatological: No rash, lesions/masses Respiratory: No cough, dyspnea Urologic: No  hematuria, dysuria Abdominal:   No nausea, vomiting, diarrhea, bright red blood per rectum, melena, or hematemesis Neurologic:  No visual changes, wkns, changes in mental status. All other systems reviewed and are otherwise negative except as noted above.  Physical Exam    VS:  There were no vitals taken for this visit. , BMI There is no height or weight on file to calculate BMI. GEN: Well nourished, well developed, in no acute distress. HEENT: normal. Neck: Supple, no JVD, carotid bruits, or masses. Cardiac: RRR, no murmurs, rubs, or gallops. No clubbing, cyanosis, edema.  Radials/DP/PT 2+ and equal bilaterally.  Respiratory:  Respirations regular and unlabored, clear to auscultation bilaterally. GI: Soft, nontender, nondistended, BS + x 4. MS: no deformity or atrophy. Skin: warm and dry, no rash. Neuro:  Strength and sensation are intact. Psych: Normal affect.  Accessory Clinical Findings    Recent Labs: 08/16/2019: B Natriuretic Peptide 1,239.1 08/17/2019: ALT 27 08/18/2019: BUN 32; Creatinine, Ser 8.89; Hemoglobin 15.1; Platelets 151; Potassium 3.6; Sodium 134 09/11/2019: TSH 5.75   Recent Lipid Panel    Component Value Date/Time   TRIG 172 (H) 09/09/2018 0330    ECG personally reviewed by me today- *** - No acute changes  Assessment & Plan   1.  ***   Jossie Ng. Kristina Mcnorton NP-C    10/08/2019, 3:10 PM [facility] [address] Office [phone] Fax [phone]

## 2019-10-09 ENCOUNTER — Ambulatory Visit: Payer: Medicare Other | Admitting: General Practice

## 2019-10-09 ENCOUNTER — Observation Stay (HOSPITAL_COMMUNITY): Payer: Medicare Other

## 2019-10-09 DIAGNOSIS — I953 Hypotension of hemodialysis: Secondary | ICD-10-CM | POA: Diagnosis present

## 2019-10-09 DIAGNOSIS — I5043 Acute on chronic combined systolic (congestive) and diastolic (congestive) heart failure: Secondary | ICD-10-CM | POA: Diagnosis not present

## 2019-10-09 DIAGNOSIS — E1122 Type 2 diabetes mellitus with diabetic chronic kidney disease: Secondary | ICD-10-CM | POA: Diagnosis present

## 2019-10-09 DIAGNOSIS — J918 Pleural effusion in other conditions classified elsewhere: Secondary | ICD-10-CM | POA: Diagnosis present

## 2019-10-09 DIAGNOSIS — G9341 Metabolic encephalopathy: Secondary | ICD-10-CM

## 2019-10-09 DIAGNOSIS — E669 Obesity, unspecified: Secondary | ICD-10-CM | POA: Diagnosis present

## 2019-10-09 DIAGNOSIS — E039 Hypothyroidism, unspecified: Secondary | ICD-10-CM | POA: Diagnosis present

## 2019-10-09 DIAGNOSIS — Z992 Dependence on renal dialysis: Secondary | ICD-10-CM | POA: Diagnosis not present

## 2019-10-09 DIAGNOSIS — I361 Nonrheumatic tricuspid (valve) insufficiency: Secondary | ICD-10-CM

## 2019-10-09 DIAGNOSIS — G4733 Obstructive sleep apnea (adult) (pediatric): Secondary | ICD-10-CM | POA: Diagnosis present

## 2019-10-09 DIAGNOSIS — G40909 Epilepsy, unspecified, not intractable, without status epilepticus: Secondary | ICD-10-CM | POA: Diagnosis present

## 2019-10-09 DIAGNOSIS — A419 Sepsis, unspecified organism: Secondary | ICD-10-CM | POA: Diagnosis present

## 2019-10-09 DIAGNOSIS — J189 Pneumonia, unspecified organism: Secondary | ICD-10-CM

## 2019-10-09 DIAGNOSIS — E1165 Type 2 diabetes mellitus with hyperglycemia: Secondary | ICD-10-CM | POA: Diagnosis present

## 2019-10-09 DIAGNOSIS — Z89511 Acquired absence of right leg below knee: Secondary | ICD-10-CM | POA: Diagnosis not present

## 2019-10-09 DIAGNOSIS — J690 Pneumonitis due to inhalation of food and vomit: Secondary | ICD-10-CM | POA: Diagnosis not present

## 2019-10-09 DIAGNOSIS — E1143 Type 2 diabetes mellitus with diabetic autonomic (poly)neuropathy: Secondary | ICD-10-CM | POA: Diagnosis present

## 2019-10-09 DIAGNOSIS — R112 Nausea with vomiting, unspecified: Secondary | ICD-10-CM | POA: Diagnosis present

## 2019-10-09 DIAGNOSIS — K219 Gastro-esophageal reflux disease without esophagitis: Secondary | ICD-10-CM | POA: Diagnosis present

## 2019-10-09 DIAGNOSIS — Z89512 Acquired absence of left leg below knee: Secondary | ICD-10-CM | POA: Diagnosis not present

## 2019-10-09 DIAGNOSIS — Z20822 Contact with and (suspected) exposure to covid-19: Secondary | ICD-10-CM | POA: Diagnosis present

## 2019-10-09 DIAGNOSIS — N186 End stage renal disease: Secondary | ICD-10-CM | POA: Diagnosis present

## 2019-10-09 DIAGNOSIS — R4182 Altered mental status, unspecified: Secondary | ICD-10-CM | POA: Diagnosis not present

## 2019-10-09 DIAGNOSIS — I132 Hypertensive heart and chronic kidney disease with heart failure and with stage 5 chronic kidney disease, or end stage renal disease: Secondary | ICD-10-CM | POA: Diagnosis present

## 2019-10-09 DIAGNOSIS — I509 Heart failure, unspecified: Secondary | ICD-10-CM | POA: Diagnosis not present

## 2019-10-09 DIAGNOSIS — J9621 Acute and chronic respiratory failure with hypoxia: Secondary | ICD-10-CM | POA: Diagnosis present

## 2019-10-09 DIAGNOSIS — J9602 Acute respiratory failure with hypercapnia: Secondary | ICD-10-CM | POA: Diagnosis present

## 2019-10-09 DIAGNOSIS — R778 Other specified abnormalities of plasma proteins: Secondary | ICD-10-CM | POA: Diagnosis not present

## 2019-10-09 LAB — CBC
HCT: 48.6 % (ref 39.0–52.0)
Hemoglobin: 14.8 g/dL (ref 13.0–17.0)
MCH: 30.3 pg (ref 26.0–34.0)
MCHC: 30.5 g/dL (ref 30.0–36.0)
MCV: 99.6 fL (ref 80.0–100.0)
Platelets: 120 10*3/uL — ABNORMAL LOW (ref 150–400)
RBC: 4.88 MIL/uL (ref 4.22–5.81)
RDW: 18 % — ABNORMAL HIGH (ref 11.5–15.5)
WBC: 7.6 10*3/uL (ref 4.0–10.5)
nRBC: 0.3 % — ABNORMAL HIGH (ref 0.0–0.2)

## 2019-10-09 LAB — BASIC METABOLIC PANEL
Anion gap: 14 (ref 5–15)
BUN: 32 mg/dL — ABNORMAL HIGH (ref 6–20)
CO2: 32 mmol/L (ref 22–32)
Calcium: 10.5 mg/dL — ABNORMAL HIGH (ref 8.9–10.3)
Chloride: 90 mmol/L — ABNORMAL LOW (ref 98–111)
Creatinine, Ser: 9.27 mg/dL — ABNORMAL HIGH (ref 0.61–1.24)
GFR calc Af Amer: 7 mL/min — ABNORMAL LOW (ref >60)
GFR calc non Af Amer: 6 mL/min — ABNORMAL LOW (ref >60)
Glucose, Bld: 251 mg/dL — ABNORMAL HIGH (ref 70–99)
Potassium: 4 mmol/L (ref 3.5–5.1)
Sodium: 136 mmol/L (ref 135–145)

## 2019-10-09 LAB — CBG MONITORING, ED
Glucose-Capillary: 244 mg/dL — ABNORMAL HIGH (ref 70–99)
Glucose-Capillary: 211 mg/dL — ABNORMAL HIGH (ref 70–99)

## 2019-10-09 LAB — ECHOCARDIOGRAM LIMITED
AR max vel: 2.88 cm2
AV Area VTI: 3.3 cm2
AV Area mean vel: 2.08 cm2
AV Mean grad: 2 mmHg
AV Peak grad: 2.4 mmHg
Ao pk vel: 0.78 m/s
Height: 72 in
S' Lateral: 2.9 cm
Weight: 3209.9 oz

## 2019-10-09 LAB — TROPONIN I (HIGH SENSITIVITY): Troponin I (High Sensitivity): 157 ng/L (ref <18)

## 2019-10-09 LAB — GLUCOSE, CAPILLARY
Glucose-Capillary: 227 mg/dL — ABNORMAL HIGH (ref 70–99)
Glucose-Capillary: 147 mg/dL — ABNORMAL HIGH (ref 70–99)

## 2019-10-09 MED ORDER — METOCLOPRAMIDE HCL 5 MG PO TABS
5.0000 mg | ORAL_TABLET | Freq: Two times a day (BID) | ORAL | Status: DC
  Administered 2019-10-09 – 2019-10-13 (×9): 5 mg via ORAL
  Filled 2019-10-09 (×8): qty 1

## 2019-10-09 MED ORDER — CINACALCET HCL 30 MG PO TABS
90.0000 mg | ORAL_TABLET | ORAL | Status: DC
  Administered 2019-10-12 – 2019-10-15 (×2): 90 mg via ORAL
  Filled 2019-10-09 (×4): qty 3

## 2019-10-09 MED ORDER — CALCIUM ACETATE (PHOS BINDER) 667 MG PO CAPS
2668.0000 mg | ORAL_CAPSULE | Freq: Three times a day (TID) | ORAL | Status: DC
  Administered 2019-10-09 – 2019-10-10 (×2): 2668 mg via ORAL
  Filled 2019-10-09 (×2): qty 4

## 2019-10-09 MED ORDER — PERFLUTREN LIPID MICROSPHERE
1.0000 mL | INTRAVENOUS | Status: AC | PRN
  Administered 2019-10-09: 3 mL via INTRAVENOUS
  Filled 2019-10-09: qty 10

## 2019-10-09 MED ORDER — CHLORHEXIDINE GLUCONATE CLOTH 2 % EX PADS
6.0000 | MEDICATED_PAD | Freq: Every day | CUTANEOUS | Status: DC
  Administered 2019-10-11 – 2019-10-17 (×5): 6 via TOPICAL

## 2019-10-09 MED ORDER — SODIUM CHLORIDE 0.9 % IV SOLN
1.0000 g | INTRAVENOUS | Status: DC
  Administered 2019-10-09 – 2019-10-10 (×2): 1 g via INTRAVENOUS
  Filled 2019-10-09 (×2): qty 10

## 2019-10-09 MED ORDER — PANTOPRAZOLE SODIUM 40 MG PO TBEC
40.0000 mg | DELAYED_RELEASE_TABLET | Freq: Every day | ORAL | Status: DC
  Administered 2019-10-10: 40 mg via ORAL
  Filled 2019-10-09: qty 1

## 2019-10-09 MED ORDER — FERRIC CITRATE 1 GM 210 MG(FE) PO TABS
210.0000 mg | ORAL_TABLET | Freq: Three times a day (TID) | ORAL | Status: DC
  Administered 2019-10-09 – 2019-10-10 (×2): 210 mg via ORAL
  Filled 2019-10-09 (×5): qty 1

## 2019-10-09 MED ORDER — SODIUM CHLORIDE 0.9 % IV SOLN
500.0000 mg | INTRAVENOUS | Status: AC
  Administered 2019-10-10 – 2019-10-16 (×7): 500 mg via INTRAVENOUS
  Filled 2019-10-09 (×7): qty 500

## 2019-10-09 MED ORDER — DOCUSATE SODIUM 100 MG PO CAPS
100.0000 mg | ORAL_CAPSULE | Freq: Two times a day (BID) | ORAL | Status: DC
  Administered 2019-10-09 – 2019-10-17 (×12): 100 mg via ORAL
  Filled 2019-10-09 (×14): qty 1

## 2019-10-09 MED ORDER — ALBUTEROL SULFATE (2.5 MG/3ML) 0.083% IN NEBU: 2.5000 mg | INHALATION_SOLUTION | RESPIRATORY_TRACT | Status: DC | PRN

## 2019-10-09 MED ORDER — DIPHENHYDRAMINE HCL 25 MG PO CAPS
25.0000 mg | ORAL_CAPSULE | Freq: Once | ORAL | Status: AC
  Administered 2019-10-09: 25 mg via ORAL
  Filled 2019-10-09: qty 1

## 2019-10-09 MED ORDER — AZITHROMYCIN 250 MG PO TABS: 500.0000 mg | ORAL_TABLET | Freq: Every day | ORAL | Status: DC

## 2019-10-09 MED ORDER — LEVOTHYROXINE SODIUM 50 MCG PO TABS
50.0000 ug | ORAL_TABLET | Freq: Every day | ORAL | Status: DC
  Administered 2019-10-09 – 2019-10-17 (×9): 50 ug via ORAL
  Filled 2019-10-09 (×9): qty 1

## 2019-10-09 NOTE — Progress Notes (Signed)
PROGRESS NOTE    Howard Bell  DGL:[id number] DOB: [birth year] DOA: 10/08/2019 PCP: Horald Pollen, MD    Brief Narrative:  Patient admitted to the hospital with working diagnosis of hemodialysis hypotension complicated with metabolic encephalopathy, right upper lobe pneumonia (present on admission)   54 year old male with past medical history of end-stage renal disease on hemodialysis, seizures, systolic heart failure, hypertension, type 2 diabetes mellitus, obesity and bilateral BKA. Patient was noted to have severe weakness, confusion and altered mentation after hemodialysis.  Apparently patient had been experiencing nausea and vomiting about 24 hours prior to hospitalization.  At home his blood pressure was 85/57, capillary glucose 292. On his initial physical examination blood pressure 128/89, heart rate 122, respiratory 25, oxygen saturation 100%.  His lungs were clear to auscultation bilaterally, heart S1-S2 present rhythm, abdomen soft, patient was somnolent, but easy to arouse, able to follow answer simple questions and follow commands. Venous pH 7.36, PCO2 62, PO2 45, bicarb 37.  Sodium 135, potassium 3.8, chloride 91, bicarb 30, glucose 309, BUN 27, creatinine 8.5, troponin 66, white count 6.8, hemoglobin 15.7, hematocrit 50.2, platelets 124.  SARS COVID-19 negative. His chest radiograph had a right upper lobe interstitial/alveolar infiltrate.  EKG has been 103 bpm, left axis deviation, left bundle branch block, sinus rhythm, poor R wave progression, no significant ST segment changes, lead I/aVL T wave inversions.  Patient in the emergency room continue to show confusion and agitation, not back to his normal. Will upgrade patient to progressive care unit.   Assessment & Plan:   Principal Problem:   Community acquired pneumonia Active Problems:   Elevated troponin   ESRD on dialysis ([place])   S/P BKA (below knee amputation), right (HCC)   OSA (obstructive sleep apnea)    Seizures (HCC)   Hypotension   Metabolic encephalopathy   Acute metabolic encephalopathy   Type 2 diabetes mellitus with ESRD (end-stage renal disease) ([place])   1. Sepsis due to right upper lobe pneumonia, end-organ damage hypotension, acute hypoxic respiratory failure and metabolic encephalopathy. Present on admission. Patient continue to be confused and agitated in the ED. His oxygenation is 98%, blood pressure systolic 518 mmHG. wbc 7.6.   Will continue antibiotic therapy with ceftriaxone and azithromycin, oxymetry monitoring, follow cell count and temperature curve. Follow with blood cultures. Will get head CT and continue neuro checks per unit protocol, continue fall and aspiration precautions. He failed swallow scree in the ED, will request a formal swallow evaluation per speech therapy.   2. ESRD on HD. Patient is euvolemic, had HD yesterday, will continue to follow nephrology recommendations for further HD during his hospitalization.   3. HTN. In the setting of hypotension, will continue to hold on antihypertensive medications.  4. Uncontrolled T2DM (Hgb A1c 10.7. Will continue glucose cover and monitoring with insulin sliding scale. Fasting glucose this am 251, patient had 10 units of Lantus last night. Will hold on further long acting insulin for now due to no oral intake and risk for hypoglycemia.   5. Seizures. No signs of active seizures. Continue with Keppra.   6. Hypothyroid. Continue with levothyroxine. TSH was elevated in June (5,75), will repeat in am.   Patient continue to be at high risk for patient is at risk for worsening encephalopathy or sepsis   Status is: Observation  The patient will require care spanning > 2 midnights and should be moved to inpatient because: IV treatments appropriate due to intensity of illness or inability to take PO. Patient  with worsening mentation, will need further inpatient care with neuro checks, head CT and further IV antibiotic therapy  for no pneumonia. Patient not able to swallow due to encephalopathy   Dispo: The patient is from: Home              Anticipated d/c is to: Home              Anticipated d/c date is: 3 days              Patient currently is not medically stable to d/c.   DVT prophylaxis: Heparin   Code Status:   full  Family Communication:  No family at the bedside     Consultants:   Nephrology for HD    Antimicrobials:   Azithromycin   Ceftriaxone     Subjective: Patient is confused and agitated, follows commands and responds to simple questions. He failed his swallow screen in the ED   Objective: Vitals:   10/08/19 2300 10/09/19 0403 10/09/19 0446 10/09/19 0739  BP: 139/84 103/63 109/65 108/71  Pulse:    99  Resp: 13 (!) 24  18  Temp:      TempSrc:      SpO2:    97%  Weight:      Height:       No intake or output data in the 24 hours ending 10/09/19 0921 Filed Weights   10/08/19 1949  Weight: 91 kg    Examination:   General: deconditioned and ill looking appearing  Neurology: somnolent, confused and disorientated, agitated.,  E ENT: no pallor, no icterus, oral mucosa moist Cardiovascular: No JVD. S1-S2 present, rhythmic, no gallops, rubs, or murmurs. No lower extremity edema. Pulmonary: positive breath sounds bilaterally, bilateral rales but no wheezing or rhonchi Gastrointestinal. Abdomen porutuberant but soft and non tender.  Skin. No rashes Musculoskeletal: bilateral BKA     Data Reviewed: I have personally reviewed following labs and imaging studies  CBC: Recent Labs  Lab 10/08/19 2001 10/08/19 2008 10/09/19 0622  WBC 6.8  --  7.6  NEUTROABS 4.7  --   --   HGB 15.7 17.7* 14.8  HCT 50.2 52.0 48.6  MCV 100.8*  --  99.6  PLT 124*  --  229*   Basic Metabolic Panel: Recent Labs  Lab 10/08/19 2001 10/08/19 2008 10/09/19 0622  NA 135 136 136  K 3.8 3.9 4.0  CL 91*  --  90*  CO2 30  --  32  GLUCOSE 309*  --  251*  BUN 27*  --  32*  CREATININE 8.54*   --  9.27*  CALCIUM 9.7  --  10.5*   GFR: Estimated Creatinine Clearance: 10.2 mL/min (A) (by C-G formula based on SCr of 9.27 mg/dL (H)). Liver Function Tests: Recent Labs  Lab 10/08/19 2001  AST 22  ALT 18  ALKPHOS 80  BILITOT 1.3*  PROT 7.1  ALBUMIN 3.4*   No results for input(s): LIPASE, AMYLASE in the last 168 hours. No results for input(s): AMMONIA in the last 168 hours. Coagulation Profile: Recent Labs  Lab 10/08/19 2001  INR 1.2   Cardiac Enzymes: No results for input(s): CKTOTAL, CKMB, CKMBINDEX, TROPONINI in the last 168 hours. BNP (last 3 results) No results for input(s): PROBNP in the last 8760 hours. HbA1C: No results for input(s): HGBA1C in the last 72 hours. CBG: Recent Labs  Lab 10/08/19 2323 10/09/19 0732  GLUCAP 233* 244*   Lipid Profile: No results for input(s): CHOL, HDL,  LDLCALC, TRIG, CHOLHDL, LDLDIRECT in the last 72 hours. Thyroid Function Tests: No results for input(s): TSH, T4TOTAL, FREET4, T3FREE, THYROIDAB in the last 72 hours. Anemia Panel: No results for input(s): VITAMINB12, FOLATE, FERRITIN, TIBC, IRON, RETICCTPCT in the last 72 hours.    Radiology Studies: I have reviewed all of the imaging during this hospital visit personally     Scheduled Meds: . calcium acetate  2,668 mg Oral TID WC  . docusate sodium  100 mg Oral BID  . ferric citrate  210 mg Oral TID WC  . heparin  5,000 Units Subcutaneous Q8H  . insulin aspart  0-5 Units Subcutaneous QHS  . insulin aspart  0-9 Units Subcutaneous TID WC  . levothyroxine  50 mcg Oral Daily  . metoCLOPramide  5 mg Oral BID AC  . pantoprazole  40 mg Oral Daily   Continuous Infusions: . levETIRAcetam Stopped (10/09/19 0026)     LOS: 0 days        Haifa Hatton Gerome Apley, MD

## 2019-10-09 NOTE — ED Notes (Signed)
Spoke with wifeKenney Houseman, states pt does twist around in the bed on a regular basis, [phone]

## 2019-10-09 NOTE — ED Notes (Signed)
Pt is confused to place and time-thinks he is at [place] it is Christmas time.Admitting MD made aware

## 2019-10-09 NOTE — Progress Notes (Addendum)
Inpatient Diabetes Program Recommendations  AACE/ADA: New Consensus Statement on Inpatient Glycemic Control (2015)  Target Ranges:  Prepandial:   less than 140 mg/dL      Peak postprandial:   less than 180 mg/dL (1-2 hours)      Critically ill patients:  140 - 180 mg/dL   Lab Results  Component Value Date   GLUCAP 244 (H) 10/09/2019   HGBA1C 10.7 (H) 08/16/2019    Review of Glycemic Control Results for MAMORU, TAKESHITA (MRN [id number]) as of 10/09/2019 08:35  Ref. Range 10/08/2019 23:23 10/09/2019 07:32  Glucose-Capillary Latest Ref Range: 70 - 99 mg/dL 233 (H) 244 (H)   Diabetes history: DM 2 Outpatient Diabetes medications: Humulin NPH 14 units Daily (pt reporting 0-4 units Daily) Current orders for Inpatient glycemic control:  Lantus 10 units x1 dose given last night Novolog 0-9 units tid + hs  A1c 10.7% on 08/16/19  Inpatient Diabetes Program Recommendations:    Note pt on NPH daily at home, may not last a full 24 hours.  -  Consider obtaining another A1c -  Consider increasing Lantus to 15 units starting tomorrow 7/21.  Will see pt this admission regarding glucose control at home.  Thanks,  Tama Headings RN, MSN, BC-ADM Inpatient Diabetes Coordinator Team Pager [phone] (8a-5p)

## 2019-10-09 NOTE — ED Notes (Signed)
Lunch Tray Ordered @ 1045 

## 2019-10-09 NOTE — Evaluation (Signed)
Clinical/Bedside Swallow Evaluation Patient Details  Name: Howard Bell MRN: [id number] Date of Birth: 26-Dec-1965  Today's Date: 10/09/2019 Time: SLP Start Time (ACUTE ONLY): 1455 SLP Stop Time (ACUTE ONLY): 1525 SLP Time Calculation (min) (ACUTE ONLY): 30 min  Past Medical History:  Past Medical History:  Diagnosis Date  . Arthritis   . Congestive heart failure (CHF) ([place])   . Depression   . 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 Diabetic gastroparesis ([place]) 11/06/2017  . ESRD (end stage renal disease) on dialysis [facility])    M,W.F dialysis  . H/O seasonal allergies   . Hypertension   . Pneumonia   . Sleep apnea    not using it now, on continuous O2 2 L during the day, 3 L during the night   Past Surgical History:  Past Surgical History:  Procedure Laterality Date  . AMPUTATION Right 05/14/2018   Procedure: AMPUTATION BELOW KNEE;  Surgeon: Newt Minion, MD;  Location: [place];  Service: Orthopedics;  Laterality: Right;  . AMPUTATION Right 08/04/2018   Procedure: RIGHT ABOVE KNEE AMPUTATION;  Surgeon: Newt Minion, MD;  Location: [place];  Service: Orthopedics;  Laterality: Right;  . AMPUTATION Left 09/08/2018   Procedure: LEFT ABOVE KNEE AMPUTATION;  Surgeon: Newt Minion, MD;  Location: [place];  Service: Orthopedics;  Laterality: Left;  . APPLICATION OF WOUND VAC Left 09/08/2018   Procedure: Application Of Wound Vac;  Surgeon: Newt Minion, MD;  Location: [place];  Service: Orthopedics;  Laterality: Left;  . ESOPHAGOGASTRODUODENOSCOPY (EGD) WITH PROPOFOL N/A 12/05/2017   Procedure: ESOPHAGOGASTRODUODENOSCOPY (EGD) WITH PROPOFOL;  Surgeon: Doran Stabler, MD;  [facility];  Service: Gastroenterology;  Laterality: N/A;  . HERNIA REPAIR    . IR AV DIALY SHUNT INTRO NEEDLE/INTRACATH INITIAL W/PTA/IMG LEFT  03/30/2017   HPI:  54yo male admitted 10/08/19 with AMS, hypotension. PMH: ESRD on HD, seizure, combined chronic systolic and diastolic  heart failure with EF of 25% in May, HTN, insulin-dependent Type 2 diabetes with bilateral BKA, obesity. CXR = Moderate severity right-sided perihilar infiltrate. HeadCT =Small old posterior RIGHT parietalinfarct. 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 Pt known to speech therapy from CIR stay and BSE in 2019. Pt presents with adequate dentition, CN exam unremarkable. Pt tolerated trials of thin liquid, puree, and solid without overt s/s aspiration. Several minutes after po trials, pt exhibited cough. Wife reports pt frequently exhibits a cough unrelated to po intake. BSE in 2019 raised concern for esophageal component, and pt continues to require PPI. Recommend regular diet and thin liquids, and recommend consideration of a regular barium swallow to evaluate esophageal motility.   SLP Visit Diagnosis: Dysphagia, unspecified (R13.10)    Aspiration Risk  Mild aspiration risk    Diet Recommendation Regular;Thin liquid   Liquid Administration via: Cup;Straw Medication Administration: Whole meds with liquid Supervision: Patient able to self feed Compensations: Small sips/bites;Slow rate Postural Changes: Seated upright at 90 degrees;Remain upright for at least 30 minutes after po intake    Other  Recommendations Recommended Consults: Consider esophageal assessment Oral Care Recommendations: Oral care BID   Follow up Recommendations Other (comment) (TBD)      Frequency and Duration min 1 x/week  1 week;2 weeks       Prognosis Prognosis for Safe Diet Advancement: Good  Swallow Study   General Date of Onset: 10/08/19 HPI: 54yo male admitted 10/08/19 with AMS, hypotension. PMH: ESRD on HD, seizure, combined chronic systolic and diastolic heart failure with EF of 25% in May, HTN, insulin-dependent Type 2 diabetes with  bilateral BKA, obesity. CXR = Moderate severity right-sided perihilar infiltrate. HeadCT =Small old posterior RIGHT parietalinfarct. 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 LEFT occipital and superior RIGHTcerebellar, atrophy, no acute findings Type of Study: Bedside Swallow Evaluation Previous Swallow Assessment: BSE 2019 - reg/thin, suspect esophageal issues Diet Prior to this Study: NPO Temperature Spikes Noted: No Respiratory Status: Nasal cannula History of Recent Intubation: No Behavior/Cognition: Alert;Cooperative Oral Cavity Assessment: Within Functional Limits Oral Care Completed by SLP: No Oral Cavity - Dentition: Adequate natural dentition Vision: Functional for self-feeding Self-Feeding Abilities: Able to feed self Patient Positioning: Upright in bed Baseline Vocal Quality: Normal Volitional Cough: Strong Volitional Swallow: Able to elicit    Oral/Motor/Sensory Function Overall Oral Motor/Sensory Function: Within functional limits   Ice Chips Ice chips: Within functional limits Presentation: Spoon   Thin Liquid Thin Liquid: Within functional limits Presentation: Straw    Nectar Thick Nectar Thick Liquid: Not tested   Honey Thick Honey Thick Liquid: Not tested   Puree Puree: Within functional limits Presentation: Self Fed;Spoon   Solid     Solid: Within functional limits Presentation: [place]wai B. Quentin Ore, [facility], [place] Speech Language Pathologist Office: [phone] Pager: [phone]  Shonna Chock 10/09/2019,3:35 PM

## 2019-10-09 NOTE — ED Notes (Signed)
Pt is backwards, upside down in the bed- yelling "THey are trying to kill me! Help ME!!" assisted pt to turn around, placed back on monitor, continues to yell "Help me!" sent MD a message.

## 2019-10-09 NOTE — Progress Notes (Signed)
  Echocardiogram 2D Echocardiogram has been performed with Definity.  Howard Bell 10/09/2019, 12:14 PM

## 2019-10-09 NOTE — Consult Note (Signed)
[facility] Renal Consultation Note  Requesting MD Arrien, Jimmy Picket, MD Indication for Consultation: End-stage renal disease  Chief complaint: Altered mental status.  Patient states he does not know why he is here  HPI:  Howard Bell is a 54 y.o. male with a history of end-stage renal disease on hemodialysis Monday Wednesday Friday, diabetes, hypertension, and sleep apnea who presented to the hospital per chart review with altered mental status and weakness.  Patient states that his wife brought him and he is not sure why he is here.  Recently he has had hypotension at dialysis.  He was also reported as having hypotension at home.  He on chest x-ray had concern for a right-sided pneumonia.  He had a CT head which was negative for acute process but did note multiple prior infarcts.  Patient dialysis details as below.  The last treatment he left slightly above his dry weight as his course was complicated there by hypotension it appears.  I spoke with his nurse on the floor and she states that earlier he was only oriented to self in the ER.  He denies overt shortness of breath.  No n/v/d.   PMHx:   Past Medical History:  Diagnosis Date  . Arthritis   . Congestive heart failure (CHF) ([place])   . Depression   . 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 Diabetic gastroparesis ([place]) 11/06/2017  . ESRD (end stage renal disease) on dialysis [facility])    M,W.F dialysis  . H/O seasonal allergies   . Hypertension   . Pneumonia   . Sleep apnea    not using it now, on continuous O2 2 L during the day, 3 L during the night    Past Surgical History:  Procedure Laterality Date  . AMPUTATION Right 05/14/2018   Procedure: AMPUTATION BELOW KNEE;  Surgeon: Newt Minion, MD;  Location: [place];  Service: Orthopedics;  Laterality: Right;  . AMPUTATION Right 08/04/2018   Procedure: RIGHT ABOVE KNEE AMPUTATION;  Surgeon: Newt Minion, MD;  Location: [place];   Service: Orthopedics;  Laterality: Right;  . AMPUTATION Left 09/08/2018   Procedure: LEFT ABOVE KNEE AMPUTATION;  Surgeon: Newt Minion, MD;  Location: [place];  Service: Orthopedics;  Laterality: Left;  . APPLICATION OF WOUND VAC Left 09/08/2018   Procedure: Application Of Wound Vac;  Surgeon: Newt Minion, MD;  Location: [place];  Service: Orthopedics;  Laterality: Left;  . ESOPHAGOGASTRODUODENOSCOPY (EGD) WITH PROPOFOL N/A 12/05/2017   Procedure: ESOPHAGOGASTRODUODENOSCOPY (EGD) WITH PROPOFOL;  Surgeon: Doran Stabler, MD;  [facility];  Service: Gastroenterology;  Laterality: N/A;  . HERNIA REPAIR    . IR AV DIALY SHUNT INTRO NEEDLE/INTRACATH INITIAL W/PTA/IMG LEFT  03/30/2017    Family Hx:  Family History  Problem Relation Age of Onset  . Diabetes Mother   . Hypertension Father     Social History:  reports that he has never smoked. He has never used smokeless tobacco. He reports that he does not drink alcohol and does not use drugs.  Allergies:  Allergies  Allergen Reactions  . Gabapentin Other (See Comments)    Altered mental status    Medications: Prior to Admission medications   Medication Sig Start Date End Date Taking? Authorizing Provider  acetaminophen (TYLENOL) 325 MG tablet Take 1-2 tablets (325-650 mg total) by mouth every 4 (four) hours as needed for mild pain. 09/26/18  Yes Love, Ivan Anchors, PA-C  albuterol (  PROVENTIL HFA;VENTOLIN HFA) 108 (90 Base) MCG/ACT inhaler Inhale 1 puff into the lungs every 4 (four) hours as needed for wheezing or shortness of breath.    Yes [provider]  calcium acetate (PHOSLO) 667 MG capsule Take 2,668 mg by mouth 3 (three) times daily with meals.   Yes [provider]  carvedilol (COREG) 6.25 MG tablet Take 1 tablet (6.25 mg total) by mouth 2 (two) times daily with a meal. 08/19/19  Yes Thurnell Lose, MD  docusate sodium (COLACE) 100 MG capsule Take 1 capsule (100 mg total) by mouth 2 (two) times daily.  01/22/19  Yes Sagardia, Ines Bloomer, MD  ferric citrate (AURYXIA) 1 GM 210 MG(Fe) tablet Take 1 tablet (210 mg total) by mouth 3 (three) times daily with meals. 09/29/18  Yes Love, Ivan Anchors, PA-C  fluticasone (FLONASE) 50 MCG/ACT nasal spray Place 1 spray into both nostrils daily as needed for allergies.    Yes [provider]  Insulin NPH, Human,, Isophane, (HUMULIN N KWIKPEN) 100 UNIT/ML Kiwkpen Inject 14 Units into the skin every morning. And pen needles 1/day Patient taking differently: Inject 0-4 Units into the skin every morning. And pen needles 1/day 09/11/19  Yes Renato Shin, MD  isosorbide-hydrALAZINE (BIDIL) 20-37.5 MG tablet Take 1 tablet by mouth 3 (three) times daily. 08/19/19  Yes Thurnell Lose, MD  latanoprost (XALATAN) 0.005 % ophthalmic solution Place 1 drop into both eyes at bedtime.   Yes [provider]  levETIRAcetam (KEPPRA) 500 MG tablet Take 1 tablet (500 mg total) by mouth at bedtime. 08/19/19  Yes Thurnell Lose, MD  levothyroxine (SYNTHROID) 50 MCG tablet Take 1 tablet (50 mcg total) by mouth daily. 09/12/19  Yes Renato Shin, MD  metoCLOPramide (REGLAN) 5 MG tablet Take 1 tablet (5 mg total) by mouth 3 (three) times daily before meals. Patient taking differently: Take 5 mg by mouth 2 (two) times daily before a meal.  08/30/18  Yes Love, Ivan Anchors, PA-C  multivitamin (RENA-VIT) TABS tablet Take 1 tablet by mouth at bedtime. 01/22/19  Yes Sagardia, Ines Bloomer, MD  omeprazole (PRILOSEC) 20 MG capsule Take 1 capsule (20 mg total) by mouth at bedtime. 01/22/19  Yes Sagardia, Ines Bloomer, MD  timolol (BETIMOL) 0.5 % ophthalmic solution Place 1 drop into both eyes 2 (two) times daily. 05/07/17  Yes English, Colletta Maryland D, PA  glucose blood test strip 1 each by Other route as needed for other. Use as instructed 10/31/18   Horald Pollen, MD  Lancets (ACCU-CHEK SOFT [facility]) lancets Use as instructed 10/31/18   Horald Pollen, MD    I have reviewed the  patient's current and reported prior to admission medications.  Labs:  BMP Latest Ref Rng & Units 10/09/2019 10/08/2019 10/08/2019  Glucose 70 - 99 mg/dL 251(H) - 309(H)  BUN 6 - 20 mg/dL 32(H) - 27(H)  Creatinine 0.61 - 1.24 mg/dL 9.27(H) - 8.54(H)  BUN/Creat Ratio 9 - 20 - - -  Sodium 135 - 145 mmol/L 136 136 135  Potassium 3.5 - 5.1 mmol/L 4.0 3.9 3.8  Chloride 98 - 111 mmol/L 90(L) - 91(L)  CO2 22 - 32 mmol/L 32 - 30  Calcium 8.9 - 10.3 mg/dL 10.5(H) - 9.7    ROS:  Limited secondary to patient factors.  AMS  Physical Exam: Vitals:   10/09/19 1451 10/09/19 1550  BP:  121/89  Pulse:  98  Resp:    Temp:  97.9 F (36.6 C)  SpO2: 93%  99%     General: Adult male in bed in no acute distress at rest HEENT: Normocephalic atraumatic Eyes: Extraocular movements intact sclera and Neck:Supple trachea midline Heart: S1-S2 no rub Lung clear to auscultation and unlabored Abdomen: Softly distended/obese nontender Extremities: Bilateral above-the-knee amputations with no edema of residual limbs Skin: No rash on extremities exposed Neuro: Patient is oriented to person and location of [facility] however he has 3 gases that are incorrect for the year-2001 is the answer given each time.  He is able to give some recent events but does not know why he is here  Outpatient HD orders:  Monday Wednesday Friday [facility] 4 hours and 15 minutes BF 450 and auto 2.0 for DF EDW 88.5 kg  2K/2.25 Ca Meds: sensipar 90 mg three times a week.  Not on ESA last outpatient Hb 16.0 Last HD 7/19 and left at 89.7 kg  AV graft  Charted with some hypotension last tx   Assessment/Plan:  # RUL Pneumonia - abx per primary team   # Metabolic encephalopathy/altered mental status - Status post CT head.  Per primary team.  # End-stage renal disease on hemodialysis Monday Wednesday Friday outpatient at [facility] for dialysis per MWF schedule  # Hypertension - Agree with holding  antihypertensives at this time given hypotension  # Metabolic bone disease  - note that he is on Sensipar 90 mg 3 times a week at the outpatient dialysis unit.  Continue sensipar.  He is not on activated vitamin D and note that uncorrected calcium here is 10.5 and prohibitive for same  # Seizure disorder - on anti-epileptics per primary team   # DMT2 uncontrolled - per primary team   Claudia Desanctis 10/09/2019, 6:14 PM

## 2019-10-09 NOTE — ED Notes (Signed)
[place] placed on pt, continuing to pull at lines and monitor

## 2019-10-10 ENCOUNTER — Inpatient Hospital Stay (HOSPITAL_COMMUNITY): Payer: Medicare Other

## 2019-10-10 DIAGNOSIS — R778 Other specified abnormalities of plasma proteins: Secondary | ICD-10-CM | POA: Diagnosis not present

## 2019-10-10 DIAGNOSIS — N186 End stage renal disease: Secondary | ICD-10-CM | POA: Diagnosis not present

## 2019-10-10 DIAGNOSIS — G9341 Metabolic encephalopathy: Secondary | ICD-10-CM | POA: Diagnosis not present

## 2019-10-10 DIAGNOSIS — R112 Nausea with vomiting, unspecified: Secondary | ICD-10-CM

## 2019-10-10 DIAGNOSIS — J189 Pneumonia, unspecified organism: Secondary | ICD-10-CM | POA: Diagnosis not present

## 2019-10-10 LAB — BASIC METABOLIC PANEL
Anion gap: 18 — ABNORMAL HIGH (ref 5–15)
BUN: 47 mg/dL — ABNORMAL HIGH (ref 6–20)
CO2: 23 mmol/L (ref 22–32)
Calcium: 10.4 mg/dL — ABNORMAL HIGH (ref 8.9–10.3)
Chloride: 91 mmol/L — ABNORMAL LOW (ref 98–111)
Creatinine, Ser: 11.14 mg/dL — ABNORMAL HIGH (ref 0.61–1.24)
GFR calc Af Amer: 5 mL/min — ABNORMAL LOW (ref >60)
GFR calc non Af Amer: 5 mL/min — ABNORMAL LOW (ref >60)
Glucose, Bld: 162 mg/dL — ABNORMAL HIGH (ref 70–99)
Potassium: 5.1 mmol/L (ref 3.5–5.1)
Sodium: 132 mmol/L — ABNORMAL LOW (ref 135–145)

## 2019-10-10 LAB — CBC WITH DIFFERENTIAL/PLATELET
Abs Immature Granulocytes: 0.06 10*3/uL (ref 0.00–0.07)
Basophils Absolute: 0.1 10*3/uL (ref 0.0–0.1)
Basophils Relative: 1 %
Eosinophils Absolute: 0 10*3/uL (ref 0.0–0.5)
Eosinophils Relative: 0 %
HCT: 52.5 % — ABNORMAL HIGH (ref 39.0–52.0)
Hemoglobin: 16.7 g/dL (ref 13.0–17.0)
Immature Granulocytes: 1 %
Lymphocytes Relative: 12 %
Lymphs Abs: 1.3 10*3/uL (ref 0.7–4.0)
MCH: 31.3 pg (ref 26.0–34.0)
MCHC: 31.8 g/dL (ref 30.0–36.0)
MCV: 98.3 fL (ref 80.0–100.0)
Monocytes Absolute: 1 10*3/uL (ref 0.1–1.0)
Monocytes Relative: 9 %
Neutro Abs: 9 10*3/uL — ABNORMAL HIGH (ref 1.7–7.7)
Neutrophils Relative %: 77 %
Platelets: 146 10*3/uL — ABNORMAL LOW (ref 150–400)
RBC: 5.34 MIL/uL (ref 4.22–5.81)
RDW: 18.6 % — ABNORMAL HIGH (ref 11.5–15.5)
WBC: 11.5 10*3/uL — ABNORMAL HIGH (ref 4.0–10.5)
nRBC: 0.3 % — ABNORMAL HIGH (ref 0.0–0.2)

## 2019-10-10 LAB — TSH: TSH: 6.023 u[IU]/mL — ABNORMAL HIGH (ref 0.350–4.500)

## 2019-10-10 LAB — GLUCOSE, CAPILLARY
Glucose-Capillary: 145 mg/dL — ABNORMAL HIGH (ref 70–99)
Glucose-Capillary: 128 mg/dL — ABNORMAL HIGH (ref 70–99)
Glucose-Capillary: 179 mg/dL — ABNORMAL HIGH (ref 70–99)
Glucose-Capillary: 153 mg/dL — ABNORMAL HIGH (ref 70–99)

## 2019-10-10 LAB — PHOSPHORUS: Phosphorus: 7 mg/dL — ABNORMAL HIGH (ref 2.5–4.6)

## 2019-10-10 MED ORDER — SENNOSIDES-DOCUSATE SODIUM 8.6-50 MG PO TABS: 2.0000 | ORAL_TABLET | Freq: Every evening | ORAL | Status: DC | PRN

## 2019-10-10 MED ORDER — PANTOPRAZOLE SODIUM 40 MG PO TBEC
40.0000 mg | DELAYED_RELEASE_TABLET | Freq: Every day | ORAL | Status: DC
  Administered 2019-10-11: 40 mg via ORAL
  Filled 2019-10-10: qty 1

## 2019-10-10 MED ORDER — IPRATROPIUM-ALBUTEROL 0.5-2.5 (3) MG/3ML IN SOLN
3.0000 mL | Freq: Two times a day (BID) | RESPIRATORY_TRACT | Status: DC
  Administered 2019-10-10 – 2019-10-15 (×10): 3 mL via RESPIRATORY_TRACT
  Filled 2019-10-10 (×11): qty 3

## 2019-10-10 MED ORDER — CALCIUM ACETATE (PHOS BINDER) 667 MG PO CAPS: 2001.0000 mg | ORAL_CAPSULE | Freq: Three times a day (TID) | ORAL | Status: DC

## 2019-10-10 MED ORDER — IPRATROPIUM-ALBUTEROL 0.5-2.5 (3) MG/3ML IN SOLN: 3.0000 mL | Freq: Two times a day (BID) | RESPIRATORY_TRACT | Status: DC

## 2019-10-10 MED ORDER — POLYETHYLENE GLYCOL 3350 17 G PO PACK: 17.0000 g | PACK | Freq: Every day | ORAL | Status: DC | PRN

## 2019-10-10 MED ORDER — CALCIUM ACETATE (PHOS BINDER) 667 MG PO CAPS
2668.0000 mg | ORAL_CAPSULE | Freq: Three times a day (TID) | ORAL | Status: DC
  Administered 2019-10-10 – 2019-10-17 (×19): 2668 mg via ORAL
  Filled 2019-10-10 (×18): qty 4

## 2019-10-10 MED ORDER — ACETAMINOPHEN 325 MG PO TABS
650.0000 mg | ORAL_TABLET | Freq: Four times a day (QID) | ORAL | Status: DC | PRN
  Administered 2019-10-12 – 2019-10-17 (×2): 650 mg via ORAL
  Filled 2019-10-10 (×2): qty 2

## 2019-10-10 MED ORDER — ONDANSETRON HCL 4 MG/2ML IJ SOLN
4.0000 mg | Freq: Four times a day (QID) | INTRAMUSCULAR | Status: DC | PRN
  Administered 2019-10-10 – 2019-10-16 (×2): 4 mg via INTRAVENOUS
  Filled 2019-10-10 (×2): qty 2

## 2019-10-10 MED ORDER — IOHEXOL 9 MG/ML PO SOLN
500.0000 mL | ORAL | Status: AC
  Administered 2019-10-10 (×2): 500 mL via ORAL

## 2019-10-10 MED ORDER — SODIUM CHLORIDE 0.9 % IV SOLN
1.5000 g | Freq: Two times a day (BID) | INTRAVENOUS | Status: AC
  Administered 2019-10-10 – 2019-10-16 (×13): 1.5 g via INTRAVENOUS
  Filled 2019-10-10 (×12): qty 4
  Filled 2019-10-10: qty 1.5

## 2019-10-10 NOTE — Progress Notes (Signed)
Dialysis called and updated on imaging plan. Pt will be available for HD after 4pm today. Spoke with Corene Cornea and he stated he will pass on the information

## 2019-10-10 NOTE — Progress Notes (Signed)
Pt is refusing to put on his telemetry, pt educated about the importance of wearing his telemetry to monitor his heart pt stated "I don't need it"

## 2019-10-10 NOTE — Significant Event (Signed)
HOSPITAL MEDICINE EVENT NOTE  Notified by nursing patient is refusing to wear his telemetry.  Patient is AAO x 3 and has been informed of the risks of refusing telemetry with his history of heart disease and elevated troponin.  [name] Howard Bell

## 2019-10-10 NOTE — Progress Notes (Signed)
Pharmacy Antibiotic Note  Howard Bell is a 54 y.o. male admitted on 10/08/2019.  Pharmacy has been consulted for unasyn dosing for possible aspiration pneumonia. Pt is afebrile but WBC is now elevated at 11.5. Pt wit ESRD on HD.   Plan: Unasyn 1.5mg  IV Q12H F/u renal plans, C&S, clinical status *Pharmacy will sign off as no further dose adjustments are anticipated.   Height: 6' (182.9 cm) Weight: 104.3 kg (229 lb 15 oz) IBW/kg (Calculated) : 77.6  Temp (24hrs), Avg:97.9 F (36.6 C), Min:97.7 F (36.5 C), Max:98.6 F (37 C)  Recent Labs  Lab 10/08/19 2001 10/08/19 2158 10/09/19 0622 10/10/19 1219  WBC 6.8  --  7.6 11.5*  CREATININE 8.54*  --  9.27*  --   LATICACIDVEN  --  1.7  --   --     Estimated Creatinine Clearance: 11.6 mL/min (A) (by C-G formula based on SCr of 9.27 mg/dL (H)).    Allergies  Allergen Reactions  . Gabapentin Other (See Comments)    Altered mental status    Thank you for allowing pharmacy to be a part of this patient's care.  Rumbarger, Rande Lawman 10/10/2019 1:38 PM

## 2019-10-10 NOTE — Progress Notes (Signed)
PROGRESS NOTE    Howard Bell  CBJ:[id number] DOB: 1965/07/15 DOA: 10/08/2019 PCP: Horald Pollen, MD   Brief Narrative:  54 year old male with past medical history of end-stage renal disease on hemodialysis, seizures, systolic heart failure, hypertension, type 2 diabetes mellitus, obesity and bilateral BKA. Patient was noted to have severe weakness, confusion and altered mentation after hemodialysis.  Apparently patient had been experiencing nausea and vomiting about 24 hours prior to hospitalization.  Initially patient was hypotensive with concerns of right upper lobe pneumonia.   Assessment & Plan:   Principal Problem:   Community acquired pneumonia Active Problems:   Elevated troponin   ESRD on dialysis ([place])   S/P BKA (below knee amputation), right (HCC)   OSA (obstructive sleep apnea)   Seizures (HCC)   Hypotension   Metabolic encephalopathy   Acute metabolic encephalopathy   Type 2 diabetes mellitus with ESRD (end-stage renal disease) ([place])   Pneumonia   Sepsis secondary to right upper lobe pneumonia -Sepsis physiology overall is improved.  Concerns of possible aspiration.  Seen by speech and swallow therapy -Speech recommends mild aspiration risk otherwise regular/thin liquid -Change antibiotics to Unasyn and azithromycin to cover for aspiration -Supplemental oxygen -As needed and twice daily bronchodilators, incentive spirometer and flutter valve  Recurrent nausea and vomiting -Abdominal x-ray negative.  Will order CT abdomen pelvis with p.o. contrast. EGD in 11/2017= Normal. Will need Gastric Emptying study outpatient.  He is already on Reglan therefore will need to be held outpatient before during the study  ESRD on hemodialysis -Nephrology team following -Sensipar  Essential hypertension -Resume home meds when appropriate  Diabetes mellitus type 2, uncontrolled due to hyperglycemia -Insulin sliding scale and Accu-Chek -A1c 10.7 -Holding off on  long-acting insulin due to risk of hypoglycemia in the setting of nausea and vomiting  History of seizure -Keppra 500 mg IV at bedtime  Hypothyroidism -Continue Synthroid  GERD PPI daily    DVT prophylaxis: Subcu heparin Code Status: Full code Family Communication:  Spoke with Kenney Houseman, his wife, [phone] Status is: Inpatient  Remains inpatient appropriate because:Hemodynamically unstable   Dispo: The patient is from: Home              Anticipated d/c is to: Home              Anticipated d/c date is: 2 days              Patient currently is not medically stable to d/c.  Patient is still feeling nauseous but tolerating p.o. unsafe for discharge.   Body mass index is 31.19 kg/m.  Pressure Injury 09/15/18 Coccyx Medial Stage 2 -  Partial thickness loss of dermis presenting as a shallow open injury with a red, pink wound bed without slough. shallow skin tear-like (Active)  09/15/18 1646  Location: Coccyx  Location Orientation: Medial  Staging: Stage 2 -  Partial thickness loss of dermis presenting as a shallow open injury with a red, pink wound bed without slough.  Wound Description (Comments): shallow skin tear-like  Present on Admission: Yes   Subjective: Was having some nausea this morning when I saw the patient and tells me is related to his reflux disease hyperlipidemia  Review of Systems Otherwise negative except as per HPI, including: General: Denies fever, chills, night sweats or unintended weight loss. Resp: Denies cough, wheezing, shortness of breath. Cardiac: Denies chest pain, palpitations, orthopnea, paroxysmal nocturnal dyspnea. GI: Denies abdominal pain, nausea, vomiting, diarrhea or constipation GU: Denies dysuria, frequency,  hesitancy or incontinence MS: Denies muscle aches, joint pain or swelling Neuro: Denies headache, neurologic deficits (focal weakness, numbness, tingling), abnormal gait Psych: Denies anxiety, depression, SI/HI/AVH Skin: Denies new  rashes or lesions ID: Denies sick contacts, exotic exposures, travel  Examination:  General exam: Appears calm and comfortable  Respiratory system: Clear to auscultation. Respiratory effort normal. Cardiovascular system: S1 & S2 heard, RRR. No JVD, murmurs, rubs, gallops or clicks. No pedal edema. Gastrointestinal system: Abdomen is nondistended, soft and nontender. No organomegaly or masses felt. Normal bowel sounds heard. Central nervous system: Alert and oriented. No focal neurological deficits. Extremities: Bilateral BKA Skin: No rashes, lesions or ulcers Psychiatry: Judgement and insight appear normal. Mood & affect appropriate.     Objective: Vitals:   10/10/19 0744 10/10/19 0800 10/10/19 1251 10/10/19 1252  BP: 125/78  130/73   Pulse: 94  97 94  Resp: 19  20   Temp: 97.8 F (36.6 C)  97.8 F (36.6 C)   TempSrc:      SpO2: (!) 79%  91% 92%  Weight:  104.3 kg    Height:       No intake or output data in the 24 hours ending 10/10/19 1333 Filed Weights   10/09/19 1441 10/10/19 0416 10/10/19 0800  Weight: 91.7 kg 105.2 kg 104.3 kg     Data Reviewed:   CBC: Recent Labs  Lab 10/08/19 2001 10/08/19 2008 10/09/19 0622  WBC 6.8  --  7.6  NEUTROABS 4.7  --   --   HGB 15.7 17.7* 14.8  HCT 50.2 52.0 48.6  MCV 100.8*  --  99.6  PLT 124*  --  144*   Basic Metabolic Panel: Recent Labs  Lab 10/08/19 2001 10/08/19 2008 10/09/19 0622  NA 135 136 136  K 3.8 3.9 4.0  CL 91*  --  90*  CO2 30  --  32  GLUCOSE 309*  --  251*  BUN 27*  --  32*  CREATININE 8.54*  --  9.27*  CALCIUM 9.7  --  10.5*   GFR: Estimated Creatinine Clearance: 11.6 mL/min (A) (by C-G formula based on SCr of 9.27 mg/dL (H)). Liver Function Tests: Recent Labs  Lab 10/08/19 2001  AST 22  ALT 18  ALKPHOS 80  BILITOT 1.3*  PROT 7.1  ALBUMIN 3.4*   No results for input(s): LIPASE, AMYLASE in the last 168 hours. No results for input(s): AMMONIA in the last 168 hours. Coagulation  Profile: Recent Labs  Lab 10/08/19 2001  INR 1.2   Cardiac Enzymes: No results for input(s): CKTOTAL, CKMB, CKMBINDEX, TROPONINI in the last 168 hours. BNP (last 3 results) No results for input(s): PROBNP in the last 8760 hours. HbA1C: No results for input(s): HGBA1C in the last 72 hours. CBG: Recent Labs  Lab 10/09/19 1609 10/09/19 2157 10/10/19 0626 10/10/19 0947 10/10/19 1214  GLUCAP 227* 147* 145* 128* 179*   Lipid Profile: No results for input(s): CHOL, HDL, LDLCALC, TRIG, CHOLHDL, LDLDIRECT in the last 72 hours. Thyroid Function Tests: No results for input(s): TSH, T4TOTAL, FREET4, T3FREE, THYROIDAB in the last 72 hours. Anemia Panel: No results for input(s): VITAMINB12, FOLATE, FERRITIN, TIBC, IRON, RETICCTPCT in the last 72 hours. Sepsis Labs: Recent Labs  Lab 10/08/19 2158  LATICACIDVEN 1.7    Recent Results (from the past 240 hour(s))  Blood Culture (routine x 2)     Status: None (Preliminary result)   Collection Time: 10/08/19  9:18 PM   Specimen: BLOOD RIGHT HAND  Result Value Ref Range Status   Specimen Description BLOOD RIGHT HAND  Final   Special Requests   Final    BOTTLES DRAWN AEROBIC AND ANAEROBIC Blood Culture results may not be optimal due to an inadequate volume of blood received in culture bottles   Culture   Final    NO GROWTH 2 DAYS Performed at [facility] [address], [place], [place] [address]    Report Status PENDING  Incomplete  Blood Culture (routine x 2)     Status: None (Preliminary result)   Collection Time: 10/08/19  9:25 PM   Specimen: BLOOD  Result Value Ref Range Status   Specimen Description BLOOD RIGHT UPPER ARM  Final   Special Requests   Final    BOTTLES DRAWN AEROBIC ONLY Blood Culture results may not be optimal due to an inadequate volume of blood received in culture bottles   Culture   Final    NO GROWTH 2 DAYS Performed at [facility], [place] [address] [place], [place] [address]    Report Status  PENDING  Incomplete  SARS Coronavirus 2 by RT PCR (hospital order, performed in [facility] hospital lab) Nasopharyngeal Nasopharyngeal Swab     Status: None   Collection Time: 10/08/19  9:58 PM   Specimen: Nasopharyngeal Swab  Result Value Ref Range Status   SARS Coronavirus 2 NEGATIVE NEGATIVE Final    Comment: (NOTE) SARS-CoV-2 target nucleic acids are NOT DETECTED.  The SARS-CoV-2 RNA is generally detectable in upper and lower respiratory specimens during the acute phase of infection. The lowest concentration of SARS-CoV-2 viral copies this assay can detect is 250 copies / mL. A negative result does not preclude SARS-CoV-2 infection and should not be used as the sole basis for treatment or other patient management decisions.  A negative result may occur with improper specimen collection / handling, submission of specimen other than nasopharyngeal swab, presence of viral mutation(s) within the areas targeted by this assay, and inadequate number of viral copies (<250 copies / mL). A negative result must be combined with clinical observations, patient history, and epidemiological information.  Fact Sheet for Patients:   StrictlyIdeas.no  Fact Sheet for Healthcare Providers: BankingDealers.co.za  This test is not yet approved or  cleared by the [place] FDA and has been authorized for detection and/or diagnosis of SARS-CoV-2 by FDA under an Emergency Use Authorization (EUA).  This EUA will remain in effect (meaning this test can be used) for the duration of the COVID-19 declaration under Section 564(b)(1) of the Act, 21 U.S.C. section 360bbb-3(b)(1), unless the authorization is terminated or revoked sooner.  Performed at [facility], [place] [address], [place], [place] [address]          Radiology Studies: DG Abd 1 View  Result Date: 10/10/2019 CLINICAL DATA:  Abdominal distension. EXAM: ABDOMEN - 1 VIEW COMPARISON:   September 13, 2017. FINDINGS: The bowel gas pattern is normal. No radio-opaque calculi or other significant radiographic abnormality are seen. IMPRESSION: No evidence of bowel obstruction or ileus. Electronically Signed   By: Marijo Conception M.D.   On: 10/10/2019 10:25   CT HEAD WO CONTRAST  Result Date: 10/09/2019 CLINICAL DATA:  Subacute neurological deficits, confusion, history diabetes mellitus, hypertension, end-stage renal disease, stroke EXAM: CT HEAD WITHOUT CONTRAST TECHNIQUE: Contiguous axial images were obtained from the base of the skull through the vertex without intravenous contrast. Sagittal and coronal MPR images reconstructed from axial data set. COMPARISON:  08/16/2019 FINDINGS: Brain: Generalized atrophy. Normal ventricular morphology. No midline shift or mass effect. Small vessel chronic ischemic changes of deep cerebral white matter. Small old posterior RIGHT parietal infarct. 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 LEFT occipital and superior RIGHT cerebellar. Scattered dystrophic calcifications in basal ganglia and cerebellar hemispheres. No intracranial hemorrhage, mass lesion, or evidence of acute infarction. No extra-axial fluid collections. Vascular: Atherosclerotic calcification of internal carotid and vertebral arteries at skull base as well as multiple small vessel vascular calcifications within scalp. No hyperdense vessels. Skull: Intact Sinuses/Orbits: Clear Other: N/A IMPRESSION: Atrophy with small vessel chronic ischemic changes of deep cerebral white matter. Multiple old infarcts as above. No acute intracranial abnormalities. Electronically Signed   By: Lavonia Dana M.D.   On: 10/09/2019 10:43   DG Chest Port 1 View  Result Date: 10/08/2019 CLINICAL DATA:  Shortness of breath. EXAM: PORTABLE CHEST 1 VIEW COMPARISON:  Aug 16, 2019 FINDINGS: Moderate severity right-sided perihilar infiltrate is seen. A small pleural effusion is also noted along the lateral aspect of the mid and lower right lung.  No pneumothorax is seen. A radiopaque vascular stent is seen overlying the left apex. This is present on the prior study. The cardiac silhouette is moderately enlarged and unchanged in size. The visualized skeletal structures are unremarkable. IMPRESSION: 1. Moderate severity right-sided perihilar infiltrate. 2. Small pleural effusion along the lateral aspect of the mid and lower right lung. Electronically Signed   By: Virgina Norfolk M.D.   On: 10/08/2019 20:35   ECHOCARDIOGRAM LIMITED  Result Date: 10/09/2019    ECHOCARDIOGRAM LIMITED REPORT   Patient Name:   HAKAN NUDELMAN Date of Exam: 10/09/2019 Medical Rec #:  [id number]         Height:       72.0 in Accession #:    [id number]        Weight:       200.6 lb Date of Birth:  [birth year]         BSA:          2.133 m Patient Age:    60 years          BP:           111/83 mmHg Patient Gender: M                 HR:           98 bpm. Exam Location:  Inpatient Procedure: Limited Echo, Cardiac Doppler, Color Doppler and Intracardiac            Opacification Agent Indications:    Elevated troponin  History:        Patient has prior history of Echocardiogram examinations, most                 recent 08/17/2019. Risk Factors:Sleep Apnea, Hypertension and                 Diabetes. ESRD. H/o stroke.  Sonographer:    Clayton Lefort RDCS (AE) Referring Phys: [id number] Garvin T TU  Sonographer Comments: Technically difficult study due to poor echo windows and patient is morbidly obese. Image acquisition challenging due to patient body habitus. Limited patient mobility. IMPRESSIONS  1. Severe concentric LVH up to 1.9 cm. Findings are possibly related to hypertensive heart disease. If there are clinical concerns for infiltrative cardiomyopathy, would consider cardiac MRI. Left ventricular ejection fraction, by estimation, is 20 to 25%. The left ventricle has severely decreased function. The left ventricle demonstrates global hypokinesis. There  is severe concentric left ventricular  hypertrophy. Indeterminate diastolic filling due to E-A fusion. There is the interventricular septum is flattened in systole and diastole, consistent with right ventricular pressure and volume overload.  2. Right ventricular systolic function is severely reduced. The right ventricular size is severely enlarged. There is mildly elevated pulmonary artery systolic pressure. The estimated right ventricular systolic pressure is 20.2 mmHg.  3. The mitral valve is degenerative. Trivial mitral valve regurgitation. No evidence of mitral stenosis.  4. Tricuspid valve regurgitation is mild to moderate.  5. The aortic valve is tricuspid. Aortic valve regurgitation is not visualized. Mild to moderate aortic valve sclerosis/calcification is present, without any evidence of aortic stenosis.  6. The inferior vena cava is dilated in size with <50% respiratory variability, suggesting right atrial pressure of 15 mmHg. Comparison(s): No significant change from prior study. FINDINGS  Left Ventricle: Severe concentric LVH up to 1.9 cm. Findings are possibly related to hypertensive heart disease. If there are clinical concerns for infiltrative cardiomyopathy, would consider cardiac MRI. Left ventricular ejection fraction, by estimation, is 20 to 25%. The left ventricle has severely decreased function. The left ventricle demonstrates global hypokinesis. The left ventricular internal cavity size was normal in size. There is severe concentric left ventricular hypertrophy. The interventricular septum is flattened in systole and diastole, consistent with right ventricular pressure and volume overload. Right Ventricle: The right ventricular size is severely enlarged. No increase in right ventricular wall thickness. Right ventricular systolic function is severely reduced. There is mildly elevated pulmonary artery systolic pressure. The tricuspid regurgitant velocity is 2.29 m/s, and with an assumed right atrial pressure of 15 mmHg, the estimated  right ventricular systolic pressure is 54.2 mmHg. Pericardium: Trivial pericardial effusion is present. Mitral Valve: The mitral valve is degenerative in appearance. There is mild calcification of the anterior and posterior mitral valve leaflet(s). Mild mitral annular calcification. Trivial mitral valve regurgitation. No evidence of mitral valve stenosis. Tricuspid Valve: The tricuspid valve is grossly normal. Tricuspid valve regurgitation is mild to moderate. No evidence of tricuspid stenosis. Aortic Valve: The aortic valve is tricuspid. . There is moderate thickening and moderate calcification of the aortic valve. Aortic valve regurgitation is not visualized. Mild to moderate aortic valve sclerosis/calcification is present, without any evidence of aortic stenosis. There is moderate thickening of the aortic valve. There is moderate calcification of the aortic valve. Aortic valve mean gradient measures 2.0 mmHg. Aortic valve peak gradient measures 2.4 mmHg. Aortic valve area, by VTI measures 3.30 cm. Pulmonic Valve: The pulmonic valve was grossly normal. Pulmonic valve regurgitation is not visualized. No evidence of pulmonic stenosis. Aorta: The aortic root and ascending aorta are structurally normal, with no evidence of dilitation. Venous: The inferior vena cava is dilated in size with less than 50% respiratory variability, suggesting right atrial pressure of 15 mmHg. LEFT VENTRICLE PLAX 2D LVIDd:         3.70 cm LVIDs:         2.90 cm LV PW:         1.87 cm LV IVS:        1.84 cm LVOT diam:     2.10 cm LV SV:         35 LV SV Index:   16 LVOT Area:     3.46 cm  IVC IVC diam: 2.20 cm LEFT ATRIUM         Index LA diam:    2.90 cm 1.36 cm/m  AORTIC VALVE AV Area (Vmax):  2.88 cm AV Area (Vmean):   2.08 cm AV Area (VTI):     3.30 cm AV Vmax:           77.60 cm/s AV Vmean:          60.800 cm/s AV VTI:            0.106 m AV Peak Grad:      2.4 mmHg AV Mean Grad:      2.0 mmHg LVOT Vmax:         64.60 cm/s LVOT  Vmean:        36.600 cm/s LVOT VTI:          0.101 m LVOT/AV VTI ratio: 0.95  AORTA Ao Root diam: 3.20 cm Ao Asc diam:  3.60 cm TRICUSPID VALVE TR Peak grad:   21.0 mmHg TR Vmax:        229.00 cm/s  SHUNTS Systemic VTI:  0.10 m Systemic Diam: 2.10 cm Eleonore Chiquito MD Electronically signed by Eleonore Chiquito MD Signature Date/Time: 10/09/2019/2:05:42 PM    Final         Scheduled Meds: . calcium acetate  2,668 mg Oral TID WC  . Chlorhexidine Gluconate Cloth  6 each Topical Q0600  . cinacalcet  90 mg Oral Q M,W,F-1800  . docusate sodium  100 mg Oral BID  . heparin  5,000 Units Subcutaneous Q8H  . insulin aspart  0-5 Units Subcutaneous QHS  . insulin aspart  0-9 Units Subcutaneous TID WC  . levothyroxine  50 mcg Oral Daily  . metoCLOPramide  5 mg Oral BID AC  . [START ON 10/11/2019] pantoprazole  40 mg Oral QAC breakfast   Continuous Infusions: . azithromycin 500 mg (10/10/19 0758)  . cefTRIAXone (ROCEPHIN)  IV 1 g (10/10/19 0805)  . levETIRAcetam 500 mg (10/10/19 0255)     LOS: 1 day   Time spent= 35 mins    Jlynn Langille Arsenio Loader, MD [facility]  If 7PM-7AM, please contact night-coverage  10/10/2019, 1:33 PM

## 2019-10-10 NOTE — Progress Notes (Signed)
[facility] Progress Note  Name: Altonio Schwertner MRN: [id number] DOB: 02/08/1966  Chief Complaint:  Altered mental status   Subjective:  He refused to wear telemetry overnight.  Charted as hypoxic. Not on oxygen on my arrival - RN rechecked level and was 80% on room air and oxygen was restarted.  Updated wife via phone.  Per wife is on phoslo 4 tablets and not on auryxia due to concern for iron level  Review of systems:  Denies chest pain  Denies shortness  Denies n/v ------------- Background on consult:  Hilary Pundt is a 54 y.o. male with a history of end-stage renal disease on hemodialysis Monday Wednesday Friday, diabetes, hypertension, and sleep apnea who presented to the hospital per chart review with altered mental status and weakness.  Patient states that his wife brought him and he is not sure why he is here.  Recently he has had hypotension at dialysis.  He was also reported as having hypotension at home.  He on chest x-ray had concern for a right-sided pneumonia.  He had a CT head which was negative for acute process but did note multiple prior infarcts.  Patient dialysis details as below.  The last treatment he left slightly above his dry weight as his course was complicated there by hypotension it appears.  I spoke with his nurse on the floor and she states that earlier he was only oriented to self in the ER.  He denies overt shortness of breath.  No n/v/d.   No intake or output data in the 24 hours ending 10/10/19 0945  Vitals:  Vitals:   10/10/19 0416 10/10/19 0429 10/10/19 0744 10/10/19 0800  BP:  113/82 125/78   Pulse:  92 94   Resp:  15 19   Temp:  97.8 F (36.6 C) 97.8 F (36.6 C)   TempSrc:  Oral    SpO2:  90% (!) 79%   Weight: 105.2 kg   104.3 kg  Height:         Physical Exam:  General adult male in bed in no acute distress HEENT normocephalic atraumatic extraocular movements intact sclera anicteric Neck supple trachea midline Lungs  clear to auscultation bilaterally normal work of breathing at rest - now wearing oxygen  Heart S1S2 no rub  Abdomen soft nontender nondistended Extremities no pitting edema; bilateral BKA's   Psych normal mood and affect Neuro - doesn't know location but knows name and year Access left AVG b/t  Medications reviewed    Labs:  BMP Latest Ref Rng & Units 10/09/2019 10/08/2019 10/08/2019  Glucose 70 - 99 mg/dL 251(H) - 309(H)  BUN 6 - 20 mg/dL 32(H) - 27(H)  Creatinine 0.61 - 1.24 mg/dL 9.27(H) - 8.54(H)  BUN/Creat Ratio 9 - 20 - - -  Sodium 135 - 145 mmol/L 136 136 135  Potassium 3.5 - 5.1 mmol/L 4.0 3.9 3.8  Chloride 98 - 111 mmol/L 90(L) - 91(L)  CO2 22 - 32 mmol/L 32 - 30  Calcium 8.9 - 10.3 mg/dL 10.5(H) - 9.7   Outpatient HD orders:  Monday Wednesday Friday [facility] 4 hours and 15 minutes BF 450 and auto 2.0 for DF EDW 88.5 kg  2K/2.25 Ca Meds: sensipar 90 mg three times a week.  Not on ESA last outpatient Hb 16.0 Last HD 7/19 and left at 89.7 kg  AV graft  Charted with some hypotension last tx    Assessment/Plan:   # RUL Pneumonia - abx per primary team  #  acute hypoxic resp failure  - on supplemental oxygen  - optimize volume with HD - team treating PNA   # Metabolic encephalopathy/altered mental status - Status post CT head.  Per primary team.  # End-stage renal disease on hemodialysis Monday Wednesday Friday outpatient at [facility] - HD per MWF schedule   # Hypertension - optimize volume with HD - Agree with holding antihypertensives at this time given hypotension - reassess needs daily   # Metabolic bone disease   - note that he is on Sensipar 90 mg 3 times a week at the outpatient dialysis unit.  Continue sensipar.  He is not on activated vitamin D and note that uncorrected calcium here is 10.5 and prohibitive for same.  Check phos.  He's on two binders here - will d/c auryxia and continue outpatient phoslo for now.  Per wife concern re:  iron levels with auryxia  # Seizure disorder - on anti-epileptics per primary team   # DMT2 uncontrolled - per primary team   Claudia Desanctis, MD 10/10/2019 10:02 AM

## 2019-10-11 ENCOUNTER — Inpatient Hospital Stay (HOSPITAL_COMMUNITY): Payer: Medicare Other

## 2019-10-11 DIAGNOSIS — J189 Pneumonia, unspecified organism: Secondary | ICD-10-CM | POA: Diagnosis not present

## 2019-10-11 DIAGNOSIS — N186 End stage renal disease: Secondary | ICD-10-CM | POA: Diagnosis not present

## 2019-10-11 DIAGNOSIS — R778 Other specified abnormalities of plasma proteins: Secondary | ICD-10-CM | POA: Diagnosis not present

## 2019-10-11 DIAGNOSIS — G9341 Metabolic encephalopathy: Secondary | ICD-10-CM | POA: Diagnosis not present

## 2019-10-11 LAB — BASIC METABOLIC PANEL
Anion gap: 17 — ABNORMAL HIGH (ref 5–15)
BUN: 30 mg/dL — ABNORMAL HIGH (ref 6–20)
CO2: 25 mmol/L (ref 22–32)
Calcium: 9.6 mg/dL (ref 8.9–10.3)
Chloride: 91 mmol/L — ABNORMAL LOW (ref 98–111)
Creatinine, Ser: 8.65 mg/dL — ABNORMAL HIGH (ref 0.61–1.24)
GFR calc Af Amer: 7 mL/min — ABNORMAL LOW (ref >60)
GFR calc non Af Amer: 6 mL/min — ABNORMAL LOW (ref >60)
Glucose, Bld: 73 mg/dL (ref 70–99)
Potassium: 4.4 mmol/L (ref 3.5–5.1)
Sodium: 133 mmol/L — ABNORMAL LOW (ref 135–145)

## 2019-10-11 LAB — GLUCOSE, CAPILLARY
Glucose-Capillary: 74 mg/dL (ref 70–99)
Glucose-Capillary: 87 mg/dL (ref 70–99)
Glucose-Capillary: 146 mg/dL — ABNORMAL HIGH (ref 70–99)
Glucose-Capillary: 137 mg/dL — ABNORMAL HIGH (ref 70–99)
Glucose-Capillary: 204 mg/dL — ABNORMAL HIGH (ref 70–99)

## 2019-10-11 LAB — PROCALCITONIN: Procalcitonin: 1.26 ng/mL

## 2019-10-11 LAB — MAGNESIUM: Magnesium: 2.2 mg/dL (ref 1.7–2.4)

## 2019-10-11 MED ORDER — PANTOPRAZOLE SODIUM 40 MG PO TBEC
40.0000 mg | DELAYED_RELEASE_TABLET | Freq: Two times a day (BID) | ORAL | Status: DC
  Administered 2019-10-11 – 2019-10-17 (×12): 40 mg via ORAL
  Filled 2019-10-11 (×11): qty 1

## 2019-10-11 MED ORDER — LOPERAMIDE HCL 2 MG PO CAPS
4.0000 mg | ORAL_CAPSULE | ORAL | Status: DC | PRN
  Administered 2019-10-11: 4 mg via ORAL
  Filled 2019-10-11: qty 2

## 2019-10-11 NOTE — Progress Notes (Signed)
Patient refused CPAP.

## 2019-10-11 NOTE — Progress Notes (Signed)
PROGRESS NOTE    Howard Bell  IZT:[id number] DOB: [birth year] DOA: 10/08/2019 PCP: Horald Pollen, MD   Brief Narrative:  54 year old male with past medical history of end-stage renal disease on hemodialysis, seizures, systolic heart failure, hypertension, type 2 diabetes mellitus, obesity and bilateral BKA. Patient was noted to have severe weakness, confusion and altered mentation after hemodialysis.  Apparently patient had been experiencing nausea and vomiting about 24 hours prior to hospitalization.  Initially patient was hypotensive with concerns of right upper lobe pneumonia.  He also received dialysis during hospitalization which improved his oxygenation.   Assessment & Plan:   Principal Problem:   Community acquired pneumonia Active Problems:   Elevated troponin   ESRD on dialysis ([place])   S/P BKA (below knee amputation), right (HCC)   OSA (obstructive sleep apnea)   Seizures (HCC)   Hypotension   Metabolic encephalopathy   Acute metabolic encephalopathy   Type 2 diabetes mellitus with ESRD (end-stage renal disease) ([place])   Pneumonia   Sepsis secondary to right upper lobe pneumonia Acute on chronic hypoxia secondary to moderate-sized right effusion; 2L [place] at home. Now desat's 86% -Sepsis physiology improving, seen by speech. -Speech recommends mild aspiration risk otherwise regular/thin liquid -Continue azithromycin and Unasyn complete total 7-day course -Supplemental oxygen -As needed and twice daily bronchodilators, incentive spirometer and flutter valve -Perhaps another day of in-house aggressive dialysis may help his hypoxia  Recurrent nausea and vomiting -Abdominal x-ray negative.  CT abdomen pelvis with p.o. contrast-negative for any acute did show right-sided effusion. -Endoscopy in September 2019-normal -Continue Reglan.  Change PPI to twice daily before meals -We will need outpatient gastric emptying study -Briefly curb sided GI team.  ESRD on  hemodialysis -Nephrology team following -Sensipar  Essential hypertension -Resume home meds when appropriate  Diabetes mellitus type 2, uncontrolled due to hyperglycemia -Insulin sliding scale and Accu-Chek -A1c 10.7 -Holding off on long-acting insulin due to risk of hypoglycemia in the setting of nausea and vomiting  History of seizure -Keppra 500 mg IV at bedtime  Hypothyroidism -Continue Synthroid  GERD PPI daily   DVT prophylaxis: Subcu heparin Code Status: Full code Family Communication:  Spoke with Kenney Houseman, his wife, [phone] - again toda Status is: Inpatient  Remains inpatient appropriate because:Hemodynamically unstable   Dispo: The patient is from: Home              Anticipated d/c is to: Home              Anticipated d/c date is: 1 day              Patient currently is not medically stable to d/c.  Patient is still feeling nauseous but tolerating p.o. unsafe for discharge.   Body mass index is 31.33 kg/m.  Pressure Injury 09/15/18 Coccyx Medial Stage 2 -  Partial thickness loss of dermis presenting as a shallow open injury with a red, pink wound bed without slough. shallow skin tear-like (Active)  09/15/18 1646  Location: Coccyx  Location Orientation: Medial  Staging: Stage 2 -  Partial thickness loss of dermis presenting as a shallow open injury with a red, pink wound bed without slough.  Wound Description (Comments): shallow skin tear-like  Present on Admission: Yes   Subjective: Was having some nausea this morning when I saw the patient and tells me is related to his reflux disease hyperlipidemia  Review of Systems Otherwise negative except as per HPI, including: General = no fevers, chills, dizziness,  fatigue  HEENT/EYES = negative for loss of vision, double vision, blurred vision,  sore throa Cardiovascular= negative for chest pain, palpitation Respiratory/lungs= negative for shortness of breath, cough, wheezing; hemoptysis,  Gastrointestinal=  negative for nausea, vomiting, abdominal pain Genitourinary= negative for Dysuria MSK = Negative for arthralgia, myalgias Neurology= Negative for headache, numbness, tingling  Psychiatry= Negative for suicidal and homocidal ideation Skin= Negative for Rash   Examination:  Constitutional: Not in acute distress;2-4L [place] Respiratory: RLE crackles.  Cardiovascular: Normal sinus rhythm, no rubs Abdomen: Nontender nondistended good bowel sounds Musculoskeletal: b/l BKA Skin: No rashes seen Neurologic: CN 2-12 grossly intact.  And nonfocal Psychiatric: Normal judgment and insight. Alert and oriented x 3. Normal mood.   Left AVG  Objective: Vitals:   10/11/19 0322 10/11/19 0325 10/11/19 0801 10/11/19 0834  BP:  (!) 145/69  125/76  Pulse:  98  68  Resp:  17  19  Temp:  97.8 F (36.6 C)  98.2 F (36.8 C)  TempSrc:  Oral  Oral  SpO2:  91% 94% (!) 86%  Weight: 104.8 kg     Height:        Intake/Output Summary (Last 24 hours) at 10/11/2019 1035 Last data filed at 10/11/2019 0836 Gross per 24 hour  Intake 160 ml  Output 2000 ml  Net -1840 ml   Filed Weights   10/10/19 2055 10/11/19 0050 10/11/19 0322  Weight: 107.4 kg 104.8 kg 104.8 kg     Data Reviewed:   CBC: Recent Labs  Lab 10/08/19 2001 10/08/19 2008 10/09/19 0622 10/10/19 1219  WBC 6.8  --  7.6 11.5*  NEUTROABS 4.7  --   --  9.0*  HGB 15.7 17.7* 14.8 16.7  HCT 50.2 52.0 48.6 52.5*  MCV 100.8*  --  99.6 98.3  PLT 124*  --  120* 762*   Basic Metabolic Panel: Recent Labs  Lab 10/08/19 2001 10/08/19 2008 10/09/19 0622 10/10/19 1219 10/11/19 0514  NA 135 136 136 132* 133*  K 3.8 3.9 4.0 5.1 4.4  CL 91*  --  90* 91* 91*  CO2 30  --  32 23 25  GLUCOSE 309*  --  251* 162* 73  BUN 27*  --  32* 47* 30*  CREATININE 8.54*  --  9.27* 11.14* 8.65*  CALCIUM 9.7  --  10.5* 10.4* 9.6  MG  --   --   --   --  2.2  PHOS  --   --   --  7.0*  --    GFR: Estimated Creatinine Clearance: 12.5 mL/min (A) (by C-G  formula based on SCr of 8.65 mg/dL (H)). Liver Function Tests: Recent Labs  Lab 10/08/19 2001  AST 22  ALT 18  ALKPHOS 80  BILITOT 1.3*  PROT 7.1  ALBUMIN 3.4*   No results for input(s): LIPASE, AMYLASE in the last 168 hours. No results for input(s): AMMONIA in the last 168 hours. Coagulation Profile: Recent Labs  Lab 10/08/19 2001  INR 1.2   Cardiac Enzymes: No results for input(s): CKTOTAL, CKMB, CKMBINDEX, TROPONINI in the last 168 hours. BNP (last 3 results) No results for input(s): PROBNP in the last 8760 hours. HbA1C: No results for input(s): HGBA1C in the last 72 hours. CBG: Recent Labs  Lab 10/10/19 0947 10/10/19 1214 10/10/19 1651 10/11/19 0152 10/11/19 0612  GLUCAP 128* 179* 153* 74 87   Lipid Profile: No results for input(s): CHOL, HDL, LDLCALC, TRIG, CHOLHDL, LDLDIRECT in the last 72 hours. Thyroid Function Tests: Recent Labs  10/10/19 1219  TSH 6.023*   Anemia Panel: No results for input(s): VITAMINB12, FOLATE, FERRITIN, TIBC, IRON, RETICCTPCT in the last 72 hours. Sepsis Labs: Recent Labs  Lab 10/08/19 2158 10/11/19 0514  PROCALCITON  --  1.26  LATICACIDVEN 1.7  --     Recent Results (from the past 240 hour(s))  Blood Culture (routine x 2)     Status: None (Preliminary result)   Collection Time: 10/08/19  9:18 PM   Specimen: BLOOD RIGHT HAND  Result Value Ref Range Status   Specimen Description BLOOD RIGHT HAND  Final   Special Requests   Final    BOTTLES DRAWN AEROBIC AND ANAEROBIC Blood Culture results may not be optimal due to an inadequate volume of blood received in culture bottles   Culture   Final    NO GROWTH 2 DAYS Performed at [facility], [place] [address] [place], [place] [address]    Report Status PENDING  Incomplete  Blood Culture (routine x 2)     Status: None (Preliminary result)   Collection Time: 10/08/19  9:25 PM   Specimen: BLOOD  Result Value Ref Range Status   Specimen Description BLOOD RIGHT UPPER ARM   Final   Special Requests   Final    BOTTLES DRAWN AEROBIC ONLY Blood Culture results may not be optimal due to an inadequate volume of blood received in culture bottles   Culture   Final    NO GROWTH 2 DAYS Performed at [facility], [place] [address] [place], [place] [address]    Report Status PENDING  Incomplete  SARS Coronavirus 2 by RT PCR (hospital order, performed in [facility] hospital lab) Nasopharyngeal Nasopharyngeal Swab     Status: None   Collection Time: 10/08/19  9:58 PM   Specimen: Nasopharyngeal Swab  Result Value Ref Range Status   SARS Coronavirus 2 NEGATIVE NEGATIVE Final    Comment: (NOTE) SARS-CoV-2 target nucleic acids are NOT DETECTED.  The SARS-CoV-2 RNA is generally detectable in upper and lower respiratory specimens during the acute phase of infection. The lowest concentration of SARS-CoV-2 viral copies this assay can detect is 250 copies / mL. A negative result does not preclude SARS-CoV-2 infection and should not be used as the sole basis for treatment or other patient management decisions.  A negative result may occur with improper specimen collection / handling, submission of specimen other than nasopharyngeal swab, presence of viral mutation(s) within the areas targeted by this assay, and inadequate number of viral copies (<250 copies / mL). A negative result must be combined with clinical observations, patient history, and epidemiological information.  Fact Sheet for Patients:   StrictlyIdeas.no  Fact Sheet for Healthcare Providers: BankingDealers.co.za  This test is not yet approved or  cleared by the [place] FDA and has been authorized for detection and/or diagnosis of SARS-CoV-2 by FDA under an Emergency Use Authorization (EUA).  This EUA will remain in effect (meaning this test can be used) for the duration of the COVID-19 declaration under Section 564(b)(1) of the Act, 21  U.S.C. section 360bbb-3(b)(1), unless the authorization is terminated or revoked sooner.  Performed at [facility], [place] [address] [place], [place] [address]          Radiology Studies: CT ABDOMEN PELVIS WO CONTRAST  Result Date: 10/10/2019 CLINICAL DATA:  Abdominal distension, nausea, vomiting. EXAM: CT ABDOMEN AND PELVIS WITHOUT CONTRAST TECHNIQUE: Multidetector CT imaging of the abdomen and pelvis was performed following the standard  protocol without IV contrast. COMPARISON:  September 10, 2017. FINDINGS: Lower chest: Right lower lobe airspace opacity is noted consistent with pneumonia, with associated moderate right pleural effusion. Hepatobiliary: Probable minimal cholelithiasis is noted. No biliary dilatation is noted. The liver is unremarkable. Pancreas: Unremarkable. No pancreatic ductal dilatation or surrounding inflammatory changes. Spleen: Normal in size without focal abnormality. Adrenals/Urinary Tract: Stable left adrenal adenoma. Right adrenal gland appears normal. Bilateral renal atrophy is noted consistent with history of renal failure. Stable bilateral renal cysts are noted. No hydronephrosis or renal obstruction is noted. Urinary bladder is unremarkable. Stomach/Bowel: Stomach is within normal limits. Appendix appears normal. No evidence of bowel wall thickening, distention, or inflammatory changes. Vascular/Lymphatic: Aortic atherosclerosis. No enlarged abdominal or pelvic lymph nodes. Extensive atherosclerotic calcification of all major arterial branches is noted as well. Reproductive: Prostate is unremarkable. Other: Small bilateral fat containing inguinal hernias are noted. Minimal free fluid is noted in the pelvis. 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 No acute abnormality is noted. IMPRESSION: 1. Right lower lobe airspace opacity is noted consistent with pneumonia, with associated moderate right pleural effusion. 2.  Probable minimal cholelithiasis. 3. Stable left adrenal adenoma. 4. Bilateral renal atrophy is noted consistent with history of renal failure. Stable bilateral renal cysts are noted. 5. 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 6. Small bilateral fat containing inguinal hernias. Aortic Atherosclerosis (ICD10-I70.0). Electronically Signed   By: Marijo Conception M.D.   On: 10/10/2019 14:13   DG Abd 1 View  Result Date: 10/10/2019 CLINICAL DATA:  Abdominal distension. EXAM: ABDOMEN - 1 VIEW COMPARISON:  September 13, 2017. FINDINGS: The bowel gas pattern is normal. No radio-opaque calculi or other significant radiographic abnormality are seen. IMPRESSION: No evidence of bowel obstruction or ileus. Electronically Signed   By: Marijo Conception M.D.   On: 10/10/2019 10:25   ECHOCARDIOGRAM LIMITED  Result Date: 10/09/2019    ECHOCARDIOGRAM LIMITED REPORT   Patient Name:   FAHEEM ZIEMANN Date of Exam: 10/09/2019 Medical Rec #:  [id number]         Height:       72.0 in Accession #:    [id number]        Weight:       200.6 lb Date of Birth:  06-03-1965         BSA:          2.133 m Patient Age:    53 years          BP:           111/83 mmHg Patient Gender: M                 HR:           98 bpm. Exam Location:  Inpatient Procedure: Limited Echo, Cardiac Doppler, Color Doppler and Intracardiac            Opacification Agent Indications:    Elevated troponin  History:        Patient has prior history of Echocardiogram examinations, most                 recent 08/17/2019. Risk Factors:Sleep Apnea, Hypertension and                 Diabetes. ESRD. H/o stroke.  Sonographer:    Clayton Lefort RDCS (AE) Referring Phys: [id number] [place] T TU  Sonographer Comments: Technically difficult study due to poor echo windows and patient  is morbidly obese. Image acquisition challenging due to patient body habitus. Limited patient mobility. IMPRESSIONS  1. Severe concentric LVH up to 1.9 cm. Findings are possibly  related to hypertensive heart disease. If there are clinical concerns for infiltrative cardiomyopathy, would consider cardiac MRI. Left ventricular ejection fraction, by estimation, is 20 to 25%. The left ventricle has severely decreased function. The left ventricle demonstrates global hypokinesis. There is severe concentric left ventricular hypertrophy. Indeterminate diastolic filling due to E-A fusion. There is the interventricular septum is flattened in systole and diastole, consistent with right ventricular pressure and volume overload.  2. Right ventricular systolic function is severely reduced. The right ventricular size is severely enlarged. There is mildly elevated pulmonary artery systolic pressure. The estimated right ventricular systolic pressure is 54.0 mmHg.  3. The mitral valve is degenerative. Trivial mitral valve regurgitation. No evidence of mitral stenosis.  4. Tricuspid valve regurgitation is mild to moderate.  5. The aortic valve is tricuspid. Aortic valve regurgitation is not visualized. Mild to moderate aortic valve sclerosis/calcification is present, without any evidence of aortic stenosis.  6. The inferior vena cava is dilated in size with <50% respiratory variability, suggesting right atrial pressure of 15 mmHg. Comparison(s): No significant change from prior study. FINDINGS  Left Ventricle: Severe concentric LVH up to 1.9 cm. Findings are possibly related to hypertensive heart disease. If there are clinical concerns for infiltrative cardiomyopathy, would consider cardiac MRI. Left ventricular ejection fraction, by estimation, is 20 to 25%. The left ventricle has severely decreased function. The left ventricle demonstrates global hypokinesis. The left ventricular internal cavity size was normal in size. There is severe concentric left ventricular hypertrophy. The interventricular septum is flattened in systole and diastole, consistent with right ventricular pressure and volume overload.  Right Ventricle: The right ventricular size is severely enlarged. No increase in right ventricular wall thickness. Right ventricular systolic function is severely reduced. There is mildly elevated pulmonary artery systolic pressure. The tricuspid regurgitant velocity is 2.29 m/s, and with an assumed right atrial pressure of 15 mmHg, the estimated right ventricular systolic pressure is 98.1 mmHg. Pericardium: Trivial pericardial effusion is present. Mitral Valve: The mitral valve is degenerative in appearance. There is mild calcification of the anterior and posterior mitral valve leaflet(s). Mild mitral annular calcification. Trivial mitral valve regurgitation. No evidence of mitral valve stenosis. Tricuspid Valve: The tricuspid valve is grossly normal. Tricuspid valve regurgitation is mild to moderate. No evidence of tricuspid stenosis. Aortic Valve: The aortic valve is tricuspid. . There is moderate thickening and moderate calcification of the aortic valve. Aortic valve regurgitation is not visualized. Mild to moderate aortic valve sclerosis/calcification is present, without any evidence of aortic stenosis. There is moderate thickening of the aortic valve. There is moderate calcification of the aortic valve. Aortic valve mean gradient measures 2.0 mmHg. Aortic valve peak gradient measures 2.4 mmHg. Aortic valve area, by VTI measures 3.30 cm. Pulmonic Valve: The pulmonic valve was grossly normal. Pulmonic valve regurgitation is not visualized. No evidence of pulmonic stenosis. Aorta: The aortic root and ascending aorta are structurally normal, with no evidence of dilitation. Venous: The inferior vena cava is dilated in size with less than 50% respiratory variability, suggesting right atrial pressure of 15 mmHg. LEFT VENTRICLE PLAX 2D LVIDd:         3.70 cm LVIDs:         2.90 cm LV PW:         1.87 cm LV IVS:  1.84 cm LVOT diam:     2.10 cm LV SV:         35 LV SV Index:   16 LVOT Area:     3.46 cm  IVC IVC  diam: 2.20 cm LEFT ATRIUM         Index LA diam:    2.90 cm 1.36 cm/m  AORTIC VALVE AV Area (Vmax):    2.88 cm AV Area (Vmean):   2.08 cm AV Area (VTI):     3.30 cm AV Vmax:           77.60 cm/s AV Vmean:          60.800 cm/s AV VTI:            0.106 m AV Peak Grad:      2.4 mmHg AV Mean Grad:      2.0 mmHg LVOT Vmax:         64.60 cm/s LVOT Vmean:        36.600 cm/s LVOT VTI:          0.101 m LVOT/AV VTI ratio: 0.95  AORTA Ao Root diam: 3.20 cm Ao Asc diam:  3.60 cm TRICUSPID VALVE TR Peak grad:   21.0 mmHg TR Vmax:        229.00 cm/s  SHUNTS Systemic VTI:  0.10 m Systemic Diam: 2.10 cm Eleonore Chiquito MD Electronically signed by Eleonore Chiquito MD Signature Date/Time: 10/09/2019/2:05:42 PM    Final    DG ESOPHAGUS W SINGLE CM (SOL OR THIN BA)  Result Date: 10/10/2019 CLINICAL DATA:  54 year old male with history of gastroparesis. Vomiting. Food getting stuck in the esophagus. EXAM: ESOPHOGRAM/BARIUM SWALLOW TECHNIQUE: Single contrast examination was performed using thin barium. A barium tablet was also administered. FLUOROSCOPY TIME:  Fluoroscopy Time:  36 seconds Radiation Exposure Index (if provided by the fluoroscopic device): 7.0 mGy Number of Acquired Spot Images: 0 COMPARISON:  None. FINDINGS: Because of limited patient mobility, single contrast examination was performed. This demonstrated failure to fully propagate the majority of primary peristaltic waves. Mild tertiary contractions were observed. Esophagus appeared patulous, but was otherwise structurally normal. Specifically, no esophageal mass, stricture, esophageal ring or hiatal hernia was noted. A barium tablet was administered which passed readily into the stomach. IMPRESSION: 1. 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 2. Patulous esophagus. Electronically Signed   By: Vinnie Langton M.D.   On: 10/10/2019 16:44        Scheduled Meds: . calcium acetate  2,668 mg Oral TID WC  . Chlorhexidine  Gluconate Cloth  6 each Topical Q0600  . cinacalcet  90 mg Oral Q M,W,F-1800  . docusate sodium  100 mg Oral BID  . heparin  5,000 Units Subcutaneous Q8H  . insulin aspart  0-5 Units Subcutaneous QHS  . insulin aspart  0-9 Units Subcutaneous TID WC  . ipratropium-albuterol  3 mL Nebulization BID  . levothyroxine  50 mcg Oral Daily  . metoCLOPramide  5 mg Oral BID AC  . pantoprazole  40 mg Oral QAC breakfast   Continuous Infusions: . ampicillin-sulbactam (UNASYN) IV 1.5 g (10/11/19 0237)  . azithromycin 500 mg (10/11/19 0921)  . levETIRAcetam 500 mg (10/11/19 0145)     LOS: 2 days   Time spent= 35 mins    Pegge Cumberledge Arsenio Loader, MD [facility]  If 7PM-7AM, please contact night-coverage  10/11/2019, 10:35 AM

## 2019-10-11 NOTE — Progress Notes (Signed)
  Speech Language Pathology Treatment: Dysphagia  Patient Details Name: Howard Bell MRN: [id number] DOB: [birth year] Today's Date: 10/11/2019 Time: 4483-0159 SLP Time Calculation (min) (ACUTE ONLY): 8 min  Assessment / Plan / Recommendation Clinical Impression  Pt was seen for dysphagia treatment and was cooperative throughout the session. Pt and nursing reported that the pt has been tolerating the current diet without overt s/sx of aspiration. Pt tolerated  regular texture solids and thin liquids via straw using consecutive swallows without symptoms of oropharyngeal dysphagia. Pt reported that he has been observing the swallowing/aspiration precautions outlined by SLP to reduce risk of aspiration secondary to esophageal dysphagia. Per the pt, he has not been symptomatic of globus sensation or aspiration since. It is recommended that the current diet be continued with continued observance of precautions. Further skilled SLP services are not clinically indicated at this time.    HPI HPI: 54yo male admitted 10/08/19 with AMS, hypotension. PMH: ESRD on HD, seizure, combined chronic systolic and diastolic heart failure with EF of 25% in May, HTN, insulin-dependent Type 2 diabetes with bilateral BKA, obesity. CXR = Moderate severity right-sided perihilar infiltrate. HeadCT =Small old posterior RIGHT parietalinfarct. 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 LEFT occipital and superior RIGHTcerebellar, atrophy, no acute findings. 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 Patulous esophagus.      SLP Plan  All goals met;Discharge SLP treatment due to (comment)       Recommendations  Diet recommendations: Regular;Thin liquid Liquids provided via: Cup;Straw Medication Administration: Whole meds with liquid Supervision: Patient able to self feed Compensations: Small sips/bites;Slow rate Postural Changes and/or Swallow Maneuvers: Seated upright 90 degrees;Upright  30-60 min after meal                Oral Care Recommendations: Oral care BID Follow up Recommendations: None SLP Visit Diagnosis: Dysphagia, unspecified (R13.10) Plan: All goals met;Discharge SLP treatment due to (comment)       Howard Bell I. Hardin Negus, [place], [place] Office number [phone] Pager [place] 10/11/2019, 9:25 AM

## 2019-10-11 NOTE — Progress Notes (Signed)
[facility] Progress Note  Name: Howard Bell MRN: [id number] DOB: [birth year]  Chief Complaint:  Altered mental status   Subjective:  Reported some nausea to primary today, no c/o on my exam but sleepy.  Tolerated UF 2L yesterday with HD.   Review of systems:  Denies chest pain  Denies shortness  Denies n/v ------------- Background on consult:  Howard Bell is a 54 y.o. male with a history of end-stage renal disease on hemodialysis Monday Wednesday Friday, diabetes, hypertension, and sleep apnea who presented to the hospital per chart review with altered mental status and weakness.  Patient states that his wife brought him and he is not sure why he is here.  Recently he has had hypotension at dialysis.  He was also reported as having hypotension at home.  He on chest x-ray had concern for a right-sided pneumonia.  He had a CT head which was negative for acute process but did note multiple prior infarcts.  Patient dialysis details as below.  The last treatment he left slightly above his dry weight as his course was complicated there by hypotension it appears.  I spoke with his nurse on the floor and she states that earlier he was only oriented to self in the ER.  He denies overt shortness of breath.  No n/v/d.    Intake/Output Summary (Last 24 hours) at 10/11/2019 1118 Last data filed at 10/11/2019 0836 Gross per 24 hour  Intake 160 ml  Output 2000 ml  Net -1840 ml    Vitals:  Vitals:   10/11/19 0322 10/11/19 0325 10/11/19 0801 10/11/19 0834  BP:  (!) 145/69  125/76  Pulse:  98  68  Resp:  17  19  Temp:  97.8 F (36.6 C)  98.2 F (36.8 C)  TempSrc:  Oral  Oral  SpO2:  91% 94% (!) 86%  Weight: 104.8 kg     Height:         Physical Exam:  General adult male in bed in no acute distress HEENT normocephalic atraumatic extraocular movements intact sclera anicteric Neck supple trachea midline Lungs clear to auscultation bilaterally normal work of  breathing at rest - On [place] O2 Heart S1S2 no rub  Abdomen soft nontender nondistended Extremities no pitting edema; bilateral BKA's   Psych normal mood and affect Neuro - sleepy, arouses to voice, says "[facility], 1981", says name Access left AVG b/t  Medications reviewed    Labs:  BMP Latest Ref Rng & Units 10/11/2019 10/10/2019 10/09/2019  Glucose 70 - 99 mg/dL 73 162(H) 251(H)  BUN 6 - 20 mg/dL 30(H) 47(H) 32(H)  Creatinine 0.61 - 1.24 mg/dL 8.65(H) 11.14(H) 9.27(H)  BUN/Creat Ratio 9 - 20 - - -  Sodium 135 - 145 mmol/L 133(L) 132(L) 136  Potassium 3.5 - 5.1 mmol/L 4.4 5.1 4.0  Chloride 98 - 111 mmol/L 91(L) 91(L) 90(L)  CO2 22 - 32 mmol/L 25 23 32  Calcium 8.9 - 10.3 mg/dL 9.6 10.4(H) 10.5(H)   Outpatient HD orders:  Monday Wednesday Friday [facility] 4 hours and 15 minutes BF 450 and auto 2.0 for DF EDW 88.5 kg  2K/2.25 Ca Meds: sensipar 90 mg three times a week.  Not on ESA last outpatient Hb 16.0 Last HD 7/19 and left at 89.7 kg  AV graft  Charted with some hypotension last tx    Assessment/Plan:   # RUL Pneumonia - abx per primary team  # acute hypoxic resp failure  - on supplemental oxygen  -  optimizing volume with HD - team treating PNA   # Metabolic encephalopathy/altered mental status - Status post CT head.  Per primary team. Appears spot EEG rec by neurology.   # End-stage renal disease on hemodialysis Monday Wednesday Friday outpatient at [facility] - HD per MWF schedule - plan next tomorrow  # Hypertension - optimize volume with HD - Agree with holding antihypertensives at this time given hypotension - reassess needs daily --> Thurs normotensive to date.   # Metabolic bone disease   - note that he is on Sensipar 90 mg 3 times a week at the outpatient dialysis unit.  Continue sensipar.  He is not on activated vitamin D and note that uncorrected calcium here is 10.5 and prohibitive for same.  Elevated phos a 7; cont outpt phoslo for now;  [place] d/cd recently due to c/f iron overload.   # Seizure disorder - on anti-epileptics per primary team, per above plan spot EEG  # DMT2 uncontrolled - per primary team   Justin Mend, MD 10/11/2019 11:18 AM

## 2019-10-12 ENCOUNTER — Inpatient Hospital Stay (HOSPITAL_COMMUNITY): Payer: Medicare Other

## 2019-10-12 DIAGNOSIS — R778 Other specified abnormalities of plasma proteins: Secondary | ICD-10-CM | POA: Diagnosis not present

## 2019-10-12 DIAGNOSIS — J189 Pneumonia, unspecified organism: Secondary | ICD-10-CM | POA: Diagnosis not present

## 2019-10-12 DIAGNOSIS — N186 End stage renal disease: Secondary | ICD-10-CM | POA: Diagnosis not present

## 2019-10-12 DIAGNOSIS — G9341 Metabolic encephalopathy: Secondary | ICD-10-CM | POA: Diagnosis not present

## 2019-10-12 DIAGNOSIS — R4182 Altered mental status, unspecified: Secondary | ICD-10-CM

## 2019-10-12 LAB — CBC
HCT: 45.8 % (ref 39.0–52.0)
Hemoglobin: 14.6 g/dL (ref 13.0–17.0)
MCH: 31 pg (ref 26.0–34.0)
MCHC: 31.9 g/dL (ref 30.0–36.0)
MCV: 97.2 fL (ref 80.0–100.0)
Platelets: 129 10*3/uL — ABNORMAL LOW (ref 150–400)
RBC: 4.71 MIL/uL (ref 4.22–5.81)
RDW: 18 % — ABNORMAL HIGH (ref 11.5–15.5)
WBC: 8.3 10*3/uL (ref 4.0–10.5)
nRBC: 0.2 % (ref 0.0–0.2)

## 2019-10-12 LAB — GLUCOSE, CAPILLARY
Glucose-Capillary: 154 mg/dL — ABNORMAL HIGH (ref 70–99)
Glucose-Capillary: 82 mg/dL (ref 70–99)
Glucose-Capillary: 159 mg/dL — ABNORMAL HIGH (ref 70–99)
Glucose-Capillary: 161 mg/dL — ABNORMAL HIGH (ref 70–99)

## 2019-10-12 LAB — MAGNESIUM: Magnesium: 2.2 mg/dL (ref 1.7–2.4)

## 2019-10-12 LAB — RENAL FUNCTION PANEL
Albumin: 3 g/dL — ABNORMAL LOW (ref 3.5–5.0)
Anion gap: 14 (ref 5–15)
BUN: 43 mg/dL — ABNORMAL HIGH (ref 6–20)
CO2: 25 mmol/L (ref 22–32)
Calcium: 9.6 mg/dL (ref 8.9–10.3)
Chloride: 92 mmol/L — ABNORMAL LOW (ref 98–111)
Creatinine, Ser: 10.59 mg/dL — ABNORMAL HIGH (ref 0.61–1.24)
GFR calc Af Amer: 6 mL/min — ABNORMAL LOW (ref >60)
GFR calc non Af Amer: 5 mL/min — ABNORMAL LOW (ref >60)
Glucose, Bld: 170 mg/dL — ABNORMAL HIGH (ref 70–99)
Phosphorus: 6.7 mg/dL — ABNORMAL HIGH (ref 2.5–4.6)
Potassium: 3.9 mmol/L (ref 3.5–5.1)
Sodium: 131 mmol/L — ABNORMAL LOW (ref 135–145)

## 2019-10-12 MED ORDER — SODIUM CHLORIDE 0.9 % IV SOLN
INTRAVENOUS | Status: DC | PRN
  Administered 2019-10-12: 250 mL via INTRAVENOUS

## 2019-10-12 NOTE — Progress Notes (Signed)
[facility] Progress Note  Name: Howard Bell MRN: [id number] DOB: [birth year]  Chief Complaint:  Altered mental status   Subjective:  Seen on HD this AM - set for 2.5L but some modest hypotension --> dec machine T to 35.  Asking to come off treatment early but disoriented and agrees to stay on.   Review of systems:  Denies chest pain  Denies shortness  Denies n/v ------------- Background on consult:  Gurnie Duris is a 54 y.o. male with a history of end-stage renal disease on hemodialysis Monday Wednesday Friday, diabetes, hypertension, and sleep apnea who presented to the hospital per chart review with altered mental status and weakness.  Patient states that his wife brought him and he is not sure why he is here.  Recently he has had hypotension at dialysis.  He was also reported as having hypotension at home.  He on chest x-ray had concern for a right-sided pneumonia.  He had a CT head which was negative for acute process but did note multiple prior infarcts.  Patient dialysis details as below.  The last treatment he left slightly above his dry weight as his course was complicated there by hypotension it appears.  I spoke with his nurse on the floor and she states that earlier he was only oriented to self in the ER.  He denies overt shortness of breath.  No n/v/d.    Intake/Output Summary (Last 24 hours) at 10/12/2019 1033 Last data filed at 10/12/2019 0409 Gross per 24 hour  Intake 1300 ml  Output --  Net 1300 ml    Vitals:  Vitals:   10/12/19 0845 10/12/19 0915 10/12/19 0930 10/12/19 1000  BP: (!) 96/36 (!) 79/26 (!) 83/36 (!) 91/47  Pulse: 89 90 87 90  Resp:      Temp:      TempSrc:      SpO2:      Weight:      Height:         Physical Exam:  General adult male in bed in no acute distress HEENT normocephalic atraumatic extraocular movements intact sclera anicteric Lungs dec BS bases, normal WOB lying flat, on 2L Heart S1S2 no rub  Abdomen soft  nontender nondistended Extremities no pitting edema; bilateral BKA's   Psych normal mood and affect Neuro - awake = where "I don't know", when "I don't know", Name = correct Access left AVG running fine on HD  Medications reviewed    Labs:  BMP Latest Ref Rng & Units 10/12/2019 10/11/2019 10/10/2019  Glucose 70 - 99 mg/dL 170(H) 73 162(H)  BUN 6 - 20 mg/dL 43(H) 30(H) 47(H)  Creatinine 0.61 - 1.24 mg/dL 10.59(H) 8.65(H) 11.14(H)  BUN/Creat Ratio 9 - 20 - - -  Sodium 135 - 145 mmol/L 131(L) 133(L) 132(L)  Potassium 3.5 - 5.1 mmol/L 3.9 4.4 5.1  Chloride 98 - 111 mmol/L 92(L) 91(L) 91(L)  CO2 22 - 32 mmol/L 25 25 23   Calcium 8.9 - 10.3 mg/dL 9.6 9.6 10.4(H)   Outpatient HD orders:  Monday Wednesday Friday [facility] 4 hours and 15 minutes BF 450 and auto 2.0 for DF EDW 88.5 kg  2K/2.25 Ca Meds: sensipar 90 mg three times a week.  Not on ESA last outpatient Hb 16.0 Last HD 7/19 and left at 89.7 kg  AV graft  Charted with some hypotension last tx    Assessment/Plan:   # RUL Pneumonia - abx per primary team - c/b pleural effusion  # acute  hypoxic resp failure  - on supplemental oxygen  - optimizing volume with HD - team treating PNA  - may need pleural effusion tapped - d/w primary yesterday, will try to max UF today  # Metabolic encephalopathy/altered mental status - Status post CT head.  Per primary team.- on my exam remains encephalopathic  # End-stage renal disease on hemodialysis Monday Wednesday Friday outpatient at [facility] - HD per MWF schedule - today  # Hypertension - optimize volume with HD - Agree with holding antihypertensives at this time given hypotension  # Metabolic bone disease   - note that he is on Sensipar 90 mg 3 times a week at the outpatient dialysis unit.  Continue sensipar.  He is not on activated vitamin D and note that uncorrected calcium here is 10.5 and prohibitive for same.  Elevated phos a 6.7; cont outpt phoslo for now;  [place] d/cd recently due to c/f iron overload.   # Seizure disorder - on anti-epileptics per primary team  # DMT2 uncontrolled - per primary team   Justin Mend, MD 10/12/2019 10:33 AM

## 2019-10-12 NOTE — Progress Notes (Signed)
Patient has refused CPAP for tonight. 

## 2019-10-12 NOTE — Progress Notes (Signed)
PROGRESS NOTE    Howard Bell  XAJ:[id number] DOB: Sep 13, 1965 DOA: 10/08/2019 PCP: Horald Pollen, MD   Brief Narrative:  54 year old male with past medical history of end-stage renal disease on hemodialysis, seizures, systolic heart failure, hypertension, type 2 diabetes mellitus, obesity and bilateral BKA. Patient was noted to have severe weakness, confusion and altered mentation after hemodialysis.  Apparently patient had been experiencing nausea and vomiting about 24 hours prior to hospitalization.  Initially patient was hypotensive with concerns of right upper lobe pneumonia.  He also received dialysis during hospitalization which improved his oxygenation.   Assessment & Plan:   Principal Problem:   Community acquired pneumonia Active Problems:   Elevated troponin   ESRD on dialysis ([place])   S/P BKA (below knee amputation), right (HCC)   OSA (obstructive sleep apnea)   Seizures (HCC)   Hypotension   Metabolic encephalopathy   Acute metabolic encephalopathy   Type 2 diabetes mellitus with ESRD (end-stage renal disease) ([place])   Pneumonia   Sepsis secondary to right upper lobe pneumonia Acute on chronic hypoxia secondary to moderate-sized right effusion; 2L [place] at home. Now desat's 86% Right-sided moderate pleural effusion -Sepsis physiology improving, seen by speech. -Speech recommends mild aspiration risk otherwise regular/thin liquid -Continue azithromycin and Unasyn complete total 7-day course -Supplemental oxygen -As needed and twice daily bronchodilators, incentive spirometer and flutter valve -Worsening pleural effusion despite dialysis.  Consulted pulmonary for thoracentesis.  Recurrent nausea and vomiting -Abdominal x-ray negative.  CT abdomen pelvis with p.o. contrast-negative for any acute did show right-sided effusion. -Endoscopy in September 2019-normal -Continue Reglan.  Change PPI to twice daily before meals -Outpatient gastric emptying  study  ESRD on hemodialysis -Nephrology team following -Sensipar  Essential hypertension -Resume home meds when appropriate  Diabetes mellitus type 2, uncontrolled due to hyperglycemia -Insulin sliding scale and Accu-Chek -A1c 10.7 -Holding off on long-acting insulin due to risk of hypoglycemia in the setting of nausea and vomiting  History of seizure -Keppra 500 mg IV at bedtime  Hypothyroidism -Continue Synthroid  GERD PPI daily   DVT prophylaxis: Subcu heparin Code Status: Full code Family Communication: His wife Kenney Houseman updated Status is: Inpatient   Remains inpatient appropriate because:Hemodynamically unstable   Dispo: The patient is from: Home              Anticipated d/c is to: Home              Anticipated d/c date is: 1 day              Patient currently is not medically stable to d/c.  Maintain hospital stay due to worsening hypoxia and pleural effusion.  Plans for thoracentesis today.  Body mass index is 32.59 kg/m.  Pressure Injury 09/15/18 Coccyx Medial Stage 2 -  Partial thickness loss of dermis presenting as a shallow open injury with a red, pink wound bed without slough. shallow skin tear-like (Active)  09/15/18 1646  Location: Coccyx  Location Orientation: Medial  Staging: Stage 2 -  Partial thickness loss of dermis presenting as a shallow open injury with a red, pink wound bed without slough.  Wound Description (Comments): shallow skin tear-like  Present on Admission: Yes   Subjective: Overnight patient became more hypoxic.  Seen in dialysis this morning no complaints  Review of Systems Otherwise negative except as per HPI, including: General: Denies fever, chills, night sweats or unintended weight loss. Resp: Denies cough, wheezing, shortness of breath. Cardiac: Denies chest pain, palpitations, orthopnea, paroxysmal nocturnal  dyspnea. GI: Denies abdominal pain, nausea, vomiting, diarrhea or constipation GU: Denies dysuria, frequency,  hesitancy or incontinence MS: Denies muscle aches, joint pain or swelling Neuro: Denies headache, neurologic deficits (focal weakness, numbness, tingling), abnormal gait Psych: Denies anxiety, depression, SI/HI/AVH Skin: Denies new rashes or lesions ID: Denies sick contacts, exotic exposures, travel   Examination:  Constitutional: Not in acute distress, 6 L high flow nasal cannula Respiratory: Bibasilar rhonchi Cardiovascular: Normal sinus rhythm, no rubs Abdomen: Nontender nondistended good bowel sounds Musculoskeletal: Bilateral BKA Skin: No rashes seen Neurologic: CN 2-12 grossly intact.  And nonfocal Psychiatric: Normal judgment and insight. Alert and oriented x 3. Normal mood. Left AVG  Objective: Vitals:   10/12/19 1000 10/12/19 1030 10/12/19 1103 10/12/19 1124  BP: (!) 91/47 (!) 97/19 (!) 117/30 106/71  Pulse: 90 96 89 91  Resp:   18 18  Temp:   97.7 F (36.5 C)   TempSrc:   Oral   SpO2:    98%  Weight:   (!) 109 kg   Height:        Intake/Output Summary (Last 24 hours) at 10/12/2019 1150 Last data filed at 10/12/2019 1103 Gross per 24 hour  Intake 1300 ml  Output 1155 ml  Net 145 ml   Filed Weights   10/12/19 0110 10/12/19 0656 10/12/19 1103  Weight: (!) 105.2 kg (!) 110.2 kg (!) 109 kg     Data Reviewed:   CBC: Recent Labs  Lab 10/08/19 2001 10/08/19 2008 10/09/19 0622 10/10/19 1219 10/12/19 0718  WBC 6.8  --  7.6 11.5* 8.3  NEUTROABS 4.7  --   --  9.0*  --   HGB 15.7 17.7* 14.8 16.7 14.6  HCT 50.2 52.0 48.6 52.5* 45.8  MCV 100.8*  --  99.6 98.3 97.2  PLT 124*  --  120* 146* 542*   Basic Metabolic Panel: Recent Labs  Lab 10/08/19 2001 10/08/19 2001 10/08/19 2008 10/09/19 0622 10/10/19 1219 10/11/19 0514 10/12/19 0716  NA 135   < > 136 136 132* 133* 131*  K 3.8   < > 3.9 4.0 5.1 4.4 3.9  CL 91*  --   --  90* 91* 91* 92*  CO2 30  --   --  32 23 25 25   GLUCOSE 309*  --   --  251* 162* 73 170*  BUN 27*  --   --  32* 47* 30* 43*   CREATININE 8.54*  --   --  9.27* 11.14* 8.65* 10.59*  CALCIUM 9.7  --   --  10.5* 10.4* 9.6 9.6  MG  --   --   --   --   --  2.2 2.2  PHOS  --   --   --   --  7.0*  --  6.7*   < > = values in this interval not displayed.   GFR: Estimated Creatinine Clearance: 10.4 mL/min (A) (by C-G formula based on SCr of 10.59 mg/dL (H)). Liver Function Tests: Recent Labs  Lab 10/08/19 2001 10/12/19 0716  AST 22  --   ALT 18  --   ALKPHOS 80  --   BILITOT 1.3*  --   PROT 7.1  --   ALBUMIN 3.4* 3.0*   No results for input(s): LIPASE, AMYLASE in the last 168 hours. No results for input(s): AMMONIA in the last 168 hours. Coagulation Profile: Recent Labs  Lab 10/08/19 2001  INR 1.2   Cardiac Enzymes: No results for input(s): CKTOTAL, CKMB, CKMBINDEX,  TROPONINI in the last 168 hours. BNP (last 3 results) No results for input(s): PROBNP in the last 8760 hours. HbA1C: No results for input(s): HGBA1C in the last 72 hours. CBG: Recent Labs  Lab 10/11/19 0612 10/11/19 1135 10/11/19 1712 10/11/19 2110 10/12/19 0623  GLUCAP 87 146* 137* 204* 154*   Lipid Profile: No results for input(s): CHOL, HDL, LDLCALC, TRIG, CHOLHDL, LDLDIRECT in the last 72 hours. Thyroid Function Tests: Recent Labs    10/10/19 1219  TSH 6.023*   Anemia Panel: No results for input(s): VITAMINB12, FOLATE, FERRITIN, TIBC, IRON, RETICCTPCT in the last 72 hours. Sepsis Labs: Recent Labs  Lab 10/08/19 2158 10/11/19 0514  PROCALCITON  --  1.26  LATICACIDVEN 1.7  --     Recent Results (from the past 240 hour(s))  Blood Culture (routine x 2)     Status: None (Preliminary result)   Collection Time: 10/08/19  9:18 PM   Specimen: BLOOD RIGHT HAND  Result Value Ref Range Status   Specimen Description BLOOD RIGHT HAND  Final   Special Requests   Final    BOTTLES DRAWN AEROBIC AND ANAEROBIC Blood Culture results may not be optimal due to an inadequate volume of blood received in culture bottles   Culture   Final     NO GROWTH 4 DAYS Performed at [facility], [place] [address] [place], [place] [address]    Report Status PENDING  Incomplete  Blood Culture (routine x 2)     Status: None (Preliminary result)   Collection Time: 10/08/19  9:25 PM   Specimen: BLOOD  Result Value Ref Range Status   Specimen Description BLOOD RIGHT UPPER ARM  Final   Special Requests   Final    BOTTLES DRAWN AEROBIC ONLY Blood Culture results may not be optimal due to an inadequate volume of blood received in culture bottles   Culture   Final    NO GROWTH 4 DAYS Performed at [facility], [place] [address] [place],  [address]    Report Status PENDING  Incomplete  SARS Coronavirus 2 by RT PCR (hospital order, performed in [facility] hospital lab) Nasopharyngeal Nasopharyngeal Swab     Status: None   Collection Time: 10/08/19  9:58 PM   Specimen: Nasopharyngeal Swab  Result Value Ref Range Status   SARS Coronavirus 2 NEGATIVE NEGATIVE Final    Comment: (NOTE) SARS-CoV-2 target nucleic acids are NOT DETECTED.  The SARS-CoV-2 RNA is generally detectable in upper and lower respiratory specimens during the acute phase of infection. The lowest concentration of SARS-CoV-2 viral copies this assay can detect is 250 copies / mL. A negative result does not preclude SARS-CoV-2 infection and should not be used as the sole basis for treatment or other patient management decisions.  A negative result may occur with improper specimen collection / handling, submission of specimen other than nasopharyngeal swab, presence of viral mutation(s) within the areas targeted by this assay, and inadequate number of viral copies (<250 copies / mL). A negative result must be combined with clinical observations, patient history, and epidemiological information.  Fact Sheet for Patients:   StrictlyIdeas.no  Fact Sheet for Healthcare Providers: BankingDealers.co.za  This test is  not yet approved or  cleared by the [place] FDA and has been authorized for detection and/or diagnosis of SARS-CoV-2 by FDA under an Emergency Use Authorization (EUA).  This EUA will remain in effect (meaning this test can be used) for the duration of the COVID-19 declaration  under Section 564(b)(1) of the Act, 21 U.S.C. section 360bbb-3(b)(1), unless the authorization is terminated or revoked sooner.  Performed at [facility], [place] [address] [place], [place] [address]          Radiology Studies: CT ABDOMEN PELVIS WO CONTRAST  Result Date: 10/10/2019 CLINICAL DATA:  Abdominal distension, nausea, vomiting. EXAM: CT ABDOMEN AND PELVIS WITHOUT CONTRAST TECHNIQUE: Multidetector CT imaging of the abdomen and pelvis was performed following the standard protocol without IV contrast. COMPARISON:  September 10, 2017. FINDINGS: Lower chest: Right lower lobe airspace opacity is noted consistent with pneumonia, with associated moderate right pleural effusion. Hepatobiliary: Probable minimal cholelithiasis is noted. No biliary dilatation is noted. The liver is unremarkable. Pancreas: Unremarkable. No pancreatic ductal dilatation or surrounding inflammatory changes. Spleen: Normal in size without focal abnormality. Adrenals/Urinary Tract: Stable left adrenal adenoma. Right adrenal gland appears normal. Bilateral renal atrophy is noted consistent with history of renal failure. Stable bilateral renal cysts are noted. No hydronephrosis or renal obstruction is noted. Urinary bladder is unremarkable. Stomach/Bowel: Stomach is within normal limits. Appendix appears normal. No evidence of bowel wall thickening, distention, or inflammatory changes. Vascular/Lymphatic: Aortic atherosclerosis. No enlarged abdominal or pelvic lymph nodes. Extensive atherosclerotic calcification of all major arterial branches is noted as well. Reproductive: Prostate is unremarkable. Other: Small bilateral fat containing inguinal  hernias are noted. Minimal free fluid is noted in the pelvis. 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 No acute abnormality is noted. IMPRESSION: 1. Right lower lobe airspace opacity is noted consistent with pneumonia, with associated moderate right pleural effusion. 2. Probable minimal cholelithiasis. 3. Stable left adrenal adenoma. 4. Bilateral renal atrophy is noted consistent with history of renal failure. Stable bilateral renal cysts are noted. 5. 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 6. Small bilateral fat containing inguinal hernias. Aortic Atherosclerosis (ICD10-I70.0). Electronically Signed   By: Marijo Conception M.D.   On: 10/10/2019 14:13   DG Chest 2 View  Result Date: 10/11/2019 CLINICAL DATA:  Follow-up pleural effusion hypotensive EXAM: CHEST - 2 VIEW COMPARISON:  10/08/2019, CT chest 08/16/2019 FINDINGS: At least moderate right-sided pleural effusion which tracks along the right lateral chest, this is stable to slightly increased in size compared with most recent prior radiograph from 07/19. Airspace disease throughout the right lung. Consolidation left lung base with air bronchograms as before. Cardiomegaly. No pneumothorax. Left subclavian region vascular stent. IMPRESSION: 1. At least moderate right-sided pleural effusion which tracks along the right lateral chest, stable to slightly increased in size compared with most recent prior radiograph. 2. Bilateral airspace disease, right greater than left, concerning for bilateral pneumonia 3. Cardiomegaly Electronically Signed   By: Donavan Foil M.D.   On: 10/11/2019 19:20   DG ESOPHAGUS W SINGLE CM (SOL OR THIN BA)  Result Date: 10/10/2019 CLINICAL DATA:  54 year old male with history of gastroparesis. Vomiting. Food getting stuck in the esophagus. EXAM: ESOPHOGRAM/BARIUM SWALLOW TECHNIQUE: Single contrast examination was performed using thin  barium. A barium tablet was also administered. FLUOROSCOPY TIME:  Fluoroscopy Time:  36 seconds Radiation Exposure Index (if provided by the fluoroscopic device): 7.0 mGy Number of Acquired Spot Images: 0 COMPARISON:  None. FINDINGS: Because of limited patient mobility, single contrast examination was performed. This demonstrated failure to fully propagate the majority of primary peristaltic waves. Mild tertiary contractions were observed. Esophagus appeared patulous, but was otherwise structurally normal. Specifically, no esophageal mass, stricture, esophageal ring or hiatal hernia was  noted. A barium tablet was administered which passed readily into the stomach. IMPRESSION: 1. 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 2. Patulous esophagus. Electronically Signed   By: Vinnie Langton M.D.   On: 10/10/2019 16:44        Scheduled Meds: . calcium acetate  2,668 mg Oral TID WC  . Chlorhexidine Gluconate Cloth  6 each Topical Q0600  . cinacalcet  90 mg Oral Q M,W,F-1800  . docusate sodium  100 mg Oral BID  . heparin  5,000 Units Subcutaneous Q8H  . insulin aspart  0-5 Units Subcutaneous QHS  . insulin aspart  0-9 Units Subcutaneous TID WC  . ipratropium-albuterol  3 mL Nebulization BID  . levothyroxine  50 mcg Oral Daily  . metoCLOPramide  5 mg Oral BID AC  . pantoprazole  40 mg Oral BID AC   Continuous Infusions: . ampicillin-sulbactam (UNASYN) IV 1.5 g (10/12/19 0409)  . azithromycin 500 mg (10/11/19 0921)  . levETIRAcetam 500 mg (10/11/19 2115)     LOS: 3 days   Time spent= 35 mins    Derrico Zhong Arsenio Loader, MD [facility]  If 7PM-7AM, please contact night-coverage  10/12/2019, 11:50 AM

## 2019-10-12 NOTE — Consult Note (Addendum)
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 Developed progressive respiratory failure on 7/23>PCCM consulted for further evaluation.  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 Workup at that time revealed a right perihilar infiltrate and small right sided effusion.  Also of note, he has been struggling with completing his HD sessions due to hypotension.  He was noted to have progressive respiratory failure on 7/23. CXR, obtained following HD session today, consistent with progression of the right pleural effusion.  On arrival to his room, pt had just returned from dialysis. He did not have any oxygen on at that time and O2 sats were in the mid 4s. He was placed on 9L supplemental oxygen and O2 sats increased to mid 90s. Mental status improved with oxygenation but he was still fairly lethargic. He denies pain or feeling short of breath.  Baseline O2 requirement is 2L.  Past Medical History  ESRD on HD, combined heart failure (EF 20-25%), T2DM, OSA, seizures, CVA   Significant Hospital Events   7/19 admission 7/23 PCCM consult  Consults:  Nephrology PCCM  Procedures:  n/a  Significant Diagnostic Tests:  7/19 CXR>>moderate severity right perihilar infiltrate on the right. Small right pleural effusion 7/22 CXR>> bilateral airspace disease R>L. Moderate right pleural effusion.  7/23 CXR>>decrease in right pleural effusion (possibly positional). Patchy opacities throughout right lung.  7/20 echocardiogram: LVEF 20-25% with global LV hypokinesis and severe concentric LVH.  Interventricular septal flattening. Severely reduced RV systolic function; severely enlarged RV. Mildly elevated PAP; RV systolic pressure [id number]. Degenerative MV without significant flow abnormality. Mild-mod tricuspid regurgitation. Mild-moderate AV sclerosis/calcification without evidence for stenosis. IVC dilated in size with <50% respiratory variability suggesting RAP of 60mmHg.   Micro Data:  7/19 blood cultures x2>>NGTD  Antimicrobials:  Rocephin 7/20>7/21 Azithromycin 7/21>> Unasyn 7/21>>  Interim history/subjective:  As above  Objective   Blood pressure 104/81, pulse 95, temperature 97.7 F (36.5 C), temperature source Oral, resp. rate 18, height 6' (1.829 m), weight (!) 109 kg, SpO2 97 %.        Intake/Output Summary (Last 24 hours) at 10/12/2019 1225 Last data filed at 10/12/2019 1103 Gross per 24 hour  Intake 1300 ml  Output 1155 ml  Net 145 ml   Filed Weights   10/12/19 0110 10/12/19 0656 10/12/19 1103  Weight: (!) 105.2 kg (!) 110.2 kg (!) 109 kg    Examination: General: chronically ill appearing male in NAD HENT: [place]. MMM Lungs: breathing comfortably on 9L [place]. No accessory muscle use. Lung sounds diminished R>L. POCUS performed with attending physician at bedside--small to moderate right sided pleural effusion present. Cardiovascular: RRR. No upper extremity edema. Upper extremities warm Abdomen: moderately distended. bs present Extremities: b/l lower extremity amputee.  Neuro: awake. Oriented x1. Seems to answer yes and no questions appropriately. 5/5 strength of bilateral upper extremities.  Skin: no rash  Resolved Hospital Problem list   n/a  Assessment & Plan:   Acute on chronic respiratory failure. 2L supplemental oxygen requirement at baseline. Now on 9L. PCCM consulted for thoracentesis however POCUS not revealing of  significantly high volume pleural effusion so unsure if this would help significantly.  Suspect acute nature to be related to infection  superimposed on chronic right lung scarring. Could consider scarring from recurrent aspiration events. SLP and modified barium indicate abnormal esophageal motility which further supports this.  Empyema considered but less likely given lack of constitutional sx associated with infection. pulm edema also considered but less likely given the unilateral nature of infiltrates.  CXR today not significantly improved from admission which is not unexpected however would expect to see clinical improvement over the upcoming few days Plan Continue current management for treatment of pneumonia--presumably aspiration.  Titrate supplemental oxygen to maintain O2 saturations 92-98% Continue aspiration precautions If not improvement, or worsening in by Monday, could consider consulting IR for CT guided aspiration.  Rest per primary/nephrology  ESRD on HD MWF. Chart review indicating hypotension precluding complete HD sessions. Seizures. On keppra.  Hypothyroidism: on synthroid. T2DM. On mealtime and HS SSI Severe combined heart failure/ICM. Previously only diastolic failure, found to have new reduction in EF to 25-30% in May 2021. He was considered a poor revascularization candidate at that time. He was started on coreg, imdur and hydralazine at that time. Most recent echo from 10/09/19 showing LVEF of 20-25%. Current home medications are coreg 6.25mg , bidil 20-37.5mg  however these are being held due to hypotension.    Best practice:  Diet: renal/carb modified Pain/Anxiety/Delirium protocol (if indicated): per primary VAP protocol (if indicated): n/a DVT prophylaxis: subcut heparin GI prophylaxis: PPI Glucose control: SSI Mobility: BR Code Status: Full Family Communication: per primary Disposition: 3E  Labs   CBC: Recent Labs  Lab 10/08/19 2001 10/08/19 2008 10/09/19 0622 10/10/19 1219 10/12/19 0718  WBC 6.8  --  7.6 11.5* 8.3  NEUTROABS 4.7  --   --  9.0*  --   HGB 15.7 17.7* 14.8 16.7 14.6   HCT 50.2 52.0 48.6 52.5* 45.8  MCV 100.8*  --  99.6 98.3 97.2  PLT 124*  --  120* 146* 129*    Basic Metabolic Panel: Recent Labs  Lab 10/08/19 2001 10/08/19 2001 10/08/19 2008 10/09/19 0622 10/10/19 1219 10/11/19 0514 10/12/19 0716  NA 135   < > 136 136 132* 133* 131*  K 3.8   < > 3.9 4.0 5.1 4.4 3.9  CL 91*  --   --  90* 91* 91* 92*  CO2 30  --   --  32 23 25 25   GLUCOSE 309*  --   --  251* 162* 73 170*  BUN 27*  --   --  32* 47* 30* 43*  CREATININE 8.54*  --   --  9.27* 11.14* 8.65* 10.59*  CALCIUM 9.7  --   --  10.5* 10.4* 9.6 9.6  MG  --   --   --   --   --  2.2 2.2  PHOS  --   --   --   --  7.0*  --  6.7*   < > = values in this interval not displayed.   GFR: Estimated Creatinine Clearance: 10.4 mL/min (A) (by C-G formula based on SCr of 10.59 mg/dL (H)). Recent Labs  Lab 10/08/19 2001 10/08/19 2158 10/09/19 0622 10/10/19 1219 10/11/19 0514 10/12/19 0718  PROCALCITON  --   --   --   --  1.26  --   WBC 6.8  --  7.6 11.5*  --  8.3  LATICACIDVEN  --  1.7  --   --   --   --  Liver Function Tests: Recent Labs  Lab 10/08/19 2001 10/12/19 0716  AST 22  --   ALT 18  --   ALKPHOS 80  --   BILITOT 1.3*  --   PROT 7.1  --   ALBUMIN 3.4* 3.0*   No results for input(s): LIPASE, AMYLASE in the last 168 hours. No results for input(s): AMMONIA in the last 168 hours.  ABG    Component Value Date/Time   PHART 7.373 09/08/2018 1336   PCO2ART 43.3 09/08/2018 1336   PO2ART 274.0 (H) 09/08/2018 1336   HCO3 35.6 (H) 10/08/2019 2008   TCO2 37 (H) 10/08/2019 2008   ACIDBASEDEF 4.0 (H) 09/08/2018 1301   O2SAT 77.0 10/08/2019 2008     Coagulation Profile: Recent Labs  Lab 10/08/19 2001  INR 1.2    Cardiac Enzymes: No results for input(s): CKTOTAL, CKMB, CKMBINDEX, TROPONINI in the last 168 hours.  HbA1C: Hemoglobin A1C  Date/Time Value Ref Range Status  05/17/2019 04:39 PM 7.3 (A) 4.0 - 5.6 % Final  04/17/2019 04:23 PM 7.1 (A) 4.0 - 5.6 % Final    Hgb A1c MFr Bld  Date/Time Value Ref Range Status  08/16/2019 06:55 PM 10.7 (H) 4.8 - 5.6 % Final    Comment:    (NOTE) Pre diabetes:          5.7%-6.4% Diabetes:              >6.4% Glycemic control for   <7.0% adults with diabetes   09/10/2018 11:45 AM 6.4 (H) 4.8 - 5.6 % Final    Comment:    (NOTE)         Prediabetes: 5.7 - 6.4         Diabetes: >6.4         Glycemic control for adults with diabetes: <7.0     CBG: Recent Labs  Lab 10/11/19 1135 10/11/19 1712 10/11/19 2110 10/12/19 0623 10/12/19 1210  GLUCAP 146* 137* 204* 154* 82    Review of Systems:   Unable to obtain due to acute illness/encephalopathy  Past Medical History  He,  has a past medical history of Arthritis, Congestive heart failure (CHF) ([place]), Depression, Diabetes mellitus without complication ([place]), Diabetic gastroparesis ([place]) (11/06/2017), ESRD (end stage renal disease) on dialysis [facility]), H/O seasonal allergies, Hypertension, Pneumonia, and Sleep apnea.   Surgical History    Past Surgical History:  Procedure Laterality Date  . AMPUTATION Right 05/14/2018   Procedure: AMPUTATION BELOW KNEE;  Surgeon: Newt Minion, MD;  Location: [place];  Service: Orthopedics;  Laterality: Right;  . AMPUTATION Right 08/04/2018   Procedure: RIGHT ABOVE KNEE AMPUTATION;  Surgeon: Newt Minion, MD;  Location: [place];  Service: Orthopedics;  Laterality: Right;  . AMPUTATION Left 09/08/2018   Procedure: LEFT ABOVE KNEE AMPUTATION;  Surgeon: Newt Minion, MD;  Location: [place];  Service: Orthopedics;  Laterality: Left;  . APPLICATION OF WOUND VAC Left 09/08/2018   Procedure: Application Of Wound Vac;  Surgeon: Newt Minion, MD;  Location: [place];  Service: Orthopedics;  Laterality: Left;  . ESOPHAGOGASTRODUODENOSCOPY (EGD) WITH PROPOFOL N/A 12/05/2017   Procedure: ESOPHAGOGASTRODUODENOSCOPY (EGD) WITH PROPOFOL;  Surgeon: Doran Stabler, MD;  Location: [facility];  Service: Gastroenterology;  Laterality: N/A;  .  HERNIA REPAIR    . IR AV DIALY SHUNT INTRO NEEDLE/INTRACATH INITIAL W/PTA/IMG LEFT  03/30/2017     Social History   reports that he has never smoked. He has never used smokeless tobacco. He reports  that he does not drink alcohol and does not use drugs.   Family History   His family history includes Diabetes in his mother; Hypertension in his father.   Allergies Allergies  Allergen Reactions  . Gabapentin Other (See Comments)    Altered mental status     Home Medications  Prior to Admission medications   Medication Sig Start Date End Date Taking? Authorizing Provider  acetaminophen (TYLENOL) 325 MG tablet Take 1-2 tablets (325-650 mg total) by mouth every 4 (four) hours as needed for mild pain. 09/26/18  Yes Love, Ivan Anchors, PA-C  albuterol (PROVENTIL HFA;VENTOLIN HFA) 108 (90 Base) MCG/ACT inhaler Inhale 1 puff into the lungs every 4 (four) hours as needed for wheezing or shortness of breath.    Yes [provider]  calcium acetate (PHOSLO) 667 MG capsule Take 2,668 mg by mouth 3 (three) times daily with meals.   Yes [provider]  carvedilol (COREG) 6.25 MG tablet Take 1 tablet (6.25 mg total) by mouth 2 (two) times daily with a meal. 08/19/19  Yes Thurnell Lose, MD  docusate sodium (COLACE) 100 MG capsule Take 1 capsule (100 mg total) by mouth 2 (two) times daily. 01/22/19  Yes Sagardia, Ines Bloomer, MD  ferric citrate (AURYXIA) 1 GM 210 MG(Fe) tablet Take 1 tablet (210 mg total) by mouth 3 (three) times daily with meals. 09/29/18  Yes Love, Ivan Anchors, PA-C  fluticasone (FLONASE) 50 MCG/ACT nasal spray Place 1 spray into both nostrils daily as needed for allergies.    Yes [provider]  Insulin NPH, Human,, Isophane, (HUMULIN N KWIKPEN) 100 UNIT/ML Kiwkpen Inject 14 Units into the skin every morning. And pen needles 1/day Patient taking differently: Inject 0-4 Units into the skin every morning. And pen needles 1/day 09/11/19  Yes Renato Shin, MD   isosorbide-hydrALAZINE (BIDIL) 20-37.5 MG tablet Take 1 tablet by mouth 3 (three) times daily. 08/19/19  Yes Thurnell Lose, MD  latanoprost (XALATAN) 0.005 % ophthalmic solution Place 1 drop into both eyes at bedtime.   Yes [provider]  levETIRAcetam (KEPPRA) 500 MG tablet Take 1 tablet (500 mg total) by mouth at bedtime. 08/19/19  Yes Thurnell Lose, MD  levothyroxine (SYNTHROID) 50 MCG tablet Take 1 tablet (50 mcg total) by mouth daily. 09/12/19  Yes Renato Shin, MD  metoCLOPramide (REGLAN) 5 MG tablet Take 1 tablet (5 mg total) by mouth 3 (three) times daily before meals. Patient taking differently: Take 5 mg by mouth 2 (two) times daily before a meal.  08/30/18  Yes Love, Ivan Anchors, PA-C  multivitamin (RENA-VIT) TABS tablet Take 1 tablet by mouth at bedtime. 01/22/19  Yes Sagardia, Ines Bloomer, MD  omeprazole (PRILOSEC) 20 MG capsule Take 1 capsule (20 mg total) by mouth at bedtime. 01/22/19  Yes Sagardia, Ines Bloomer, MD  timolol (BETIMOL) 0.5 % ophthalmic solution Place 1 drop into both eyes 2 (two) times daily. 05/07/17  Yes English, Colletta Maryland D, PA  glucose blood test strip 1 each by Other route as needed for other. Use as instructed 10/31/18   Horald Pollen, MD  Lancets [facility] SOFT [facility]) lancets Use as instructed 10/31/18   Horald Pollen, MD     Mitzi Hansen, [place] PGY-2 10/12/19  12:25 PM

## 2019-10-12 NOTE — Progress Notes (Signed)
Patient refused CPAP.

## 2019-10-13 DIAGNOSIS — N186 End stage renal disease: Secondary | ICD-10-CM | POA: Diagnosis not present

## 2019-10-13 DIAGNOSIS — G4733 Obstructive sleep apnea (adult) (pediatric): Secondary | ICD-10-CM | POA: Diagnosis not present

## 2019-10-13 DIAGNOSIS — J189 Pneumonia, unspecified organism: Secondary | ICD-10-CM | POA: Diagnosis not present

## 2019-10-13 DIAGNOSIS — G9341 Metabolic encephalopathy: Secondary | ICD-10-CM | POA: Diagnosis not present

## 2019-10-13 LAB — GLUCOSE, CAPILLARY
Glucose-Capillary: 153 mg/dL — ABNORMAL HIGH (ref 70–99)
Glucose-Capillary: 115 mg/dL — ABNORMAL HIGH (ref 70–99)
Glucose-Capillary: 203 mg/dL — ABNORMAL HIGH (ref 70–99)

## 2019-10-13 LAB — CULTURE, BLOOD (ROUTINE X 2)
Culture: NO GROWTH
Culture: NO GROWTH

## 2019-10-13 LAB — BASIC METABOLIC PANEL
Anion gap: 15 (ref 5–15)
BUN: 32 mg/dL — ABNORMAL HIGH (ref 6–20)
CO2: 24 mmol/L (ref 22–32)
Calcium: 9.4 mg/dL (ref 8.9–10.3)
Chloride: 94 mmol/L — ABNORMAL LOW (ref 98–111)
Creatinine, Ser: 9.08 mg/dL — ABNORMAL HIGH (ref 0.61–1.24)
GFR calc Af Amer: 7 mL/min — ABNORMAL LOW (ref >60)
GFR calc non Af Amer: 6 mL/min — ABNORMAL LOW (ref >60)
Glucose, Bld: 144 mg/dL — ABNORMAL HIGH (ref 70–99)
Potassium: 4.3 mmol/L (ref 3.5–5.1)
Sodium: 133 mmol/L — ABNORMAL LOW (ref 135–145)

## 2019-10-13 LAB — MAGNESIUM: Magnesium: 2.2 mg/dL (ref 1.7–2.4)

## 2019-10-13 MED ORDER — LEVETIRACETAM 500 MG PO TABS: 500.0000 mg | ORAL_TABLET | Freq: Two times a day (BID) | ORAL | Status: DC

## 2019-10-13 MED ORDER — LEVETIRACETAM 500 MG PO TABS
500.0000 mg | ORAL_TABLET | Freq: Every day | ORAL | Status: DC
  Administered 2019-10-13 – 2019-10-16 (×4): 500 mg via ORAL
  Filled 2019-10-13 (×4): qty 1

## 2019-10-13 NOTE — Progress Notes (Signed)
Patient refusing Bipap.  No distress noted if he keeps on his [place].

## 2019-10-13 NOTE — Progress Notes (Signed)
Patient has been confused throughout the day.  Attempting to get OOB to "get my wife to take me home".  Stated he would "crawl" out if he had to once he realized he was bil. AKA.  Oriented only to self.

## 2019-10-13 NOTE — Progress Notes (Signed)
[facility] Progress Note  Name: Howard Bell MRN: [id number] DOB: 28-Jul-1965  Chief Complaint:  Altered mental status   Subjective:  Seen in room - not really conversational with me again.  HD yesterday limited by modest hypotension - UF 1.1L Poor po intake it appears Wts are bed weights and highly variable.  Review of systems:  Denies chest pain  Denies shortness  Denies n/v ------------- Background on consult:  Howard Bell is a 54 y.o. male with a history of end-stage renal disease on hemodialysis Monday Wednesday Friday, diabetes, hypertension, and sleep apnea who presented to the hospital per chart review with altered mental status and weakness.  Patient states that his wife brought him and he is not sure why he is here.  Recently he has had hypotension at dialysis.  He was also reported as having hypotension at home.  He on chest x-ray had concern for a right-sided pneumonia.  He had a CT head which was negative for acute process but did note multiple prior infarcts.  Patient dialysis details as below.  The last treatment he left slightly above his dry weight as his course was complicated there by hypotension it appears.  I spoke with his nurse on the floor and she states that earlier he was only oriented to self in the ER.  He denies overt shortness of breath.  No n/v/d.    Intake/Output Summary (Last 24 hours) at 10/13/2019 1405 Last data filed at 10/13/2019 0900 Gross per 24 hour  Intake 1000 ml  Output 0 ml  Net 1000 ml    Vitals:  Vitals:   10/13/19 0120 10/13/19 0342 10/13/19 0728 10/13/19 1149  BP:  115/71  (!) 113/64  Pulse:  93  92  Resp:  15  18  Temp:  98.3 F (36.8 C)  98.3 F (36.8 C)  TempSrc:  Oral  Oral  SpO2:  96% 97% 90%  Weight: (!) 106.6 kg     Height:         Physical Exam:  General adult male in bed in no acute distress HEENT normocephalic atraumatic extraocular movements intact sclera anicteric Lungs dec BS bases,  normal WOB lying flat, on 2L Heart S1S2 no rub  Abdomen soft nontender nondistended Extremities no pitting edema; bilateral BKA's   Psych normal mood and affect Neuro - sleepy - just answers I don't know to orientation questions Access left AVG +t/b, dressing removed  Medications reviewed    Labs:  BMP Latest Ref Rng & Units 10/13/2019 10/12/2019 10/11/2019  Glucose 70 - 99 mg/dL 144(H) 170(H) 73  BUN 6 - 20 mg/dL 32(H) 43(H) 30(H)  Creatinine 0.61 - 1.24 mg/dL 9.08(H) 10.59(H) 8.65(H)  BUN/Creat Ratio 9 - 20 - - -  Sodium 135 - 145 mmol/L 133(L) 131(L) 133(L)  Potassium 3.5 - 5.1 mmol/L 4.3 3.9 4.4  Chloride 98 - 111 mmol/L 94(L) 92(L) 91(L)  CO2 22 - 32 mmol/L 24 25 25   Calcium 8.9 - 10.3 mg/dL 9.4 9.6 9.6   Outpatient HD orders:  Monday Wednesday Friday [facility] 4 hours and 15 minutes BF 450 and auto 2.0 for DF EDW 88.5 kg  2K/2.25 Ca Meds: sensipar 90 mg three times a week.  Not on ESA last outpatient Hb 16.0 Last HD 7/19 and left at 89.7 kg  AV graft  Charted with some hypotension last tx    Assessment/Plan:   # RUL Pneumonia - abx per primary team - c/b pleural effusion: pulm saw Friday,  too small to tap; they will f/u Mon  # acute hypoxic resp failure  - on supplemental oxygen  - optimizing volume with HD - team treating PNA  - no e/o pulmonary edema on serial imaging  # Metabolic encephalopathy/altered mental status - Status post CT head.  Per primary team.- on my exam remains encephalopathic  # End-stage renal disease on hemodialysis Monday Wednesday Friday outpatient at [facility] - HD per MWF schedule - HD Friday  # Hypertension - optimize volume with HD - Agree with holding antihypertensives at this time given hypotension - Seems little po intake and by I/Os he's ~ even for admission but wt recently in the 107-109 kg from 89.7kg last HD ?? Not massively overloaded as discrepant wt suggests.   These are clearly bed weights and I don't  think accurate  # Metabolic bone disease   - note that he is on Sensipar 90 mg 3 times a week at the outpatient dialysis unit.  Continue sensipar.  He is not on activated vitamin D and note that uncorrected calcium here is 10.5 and prohibitive for same.  Elevated phos a 6.7; cont outpt phoslo for now; [place] d/cd recently due to c/f iron overload.   # Seizure disorder - on anti-epileptics per primary team  # DMT2 uncontrolled - per primary team   Justin Mend, MD 10/13/2019 2:05 PM

## 2019-10-13 NOTE — Progress Notes (Signed)
PROGRESS NOTE    Howard Bell  HJS:[id number] DOB: 08/06/1965 DOA: 10/08/2019 PCP: Horald Pollen, MD   Brief Narrative:  54 year old male with past medical history of end-stage renal disease on hemodialysis, seizures, systolic heart failure, hypertension, type 2 diabetes mellitus, obesity and bilateral BKA. Patient was noted to have severe weakness, confusion and altered mentation after hemodialysis.  Apparently patient had been experiencing nausea and vomiting about 24 hours prior to hospitalization.  Initially patient was hypotensive with concerns of right upper lobe pneumonia.  He also received dialysis during hospitalization which improved his oxygenation.   Assessment & Plan:   Principal Problem:   Community acquired pneumonia Active Problems:   Elevated troponin   ESRD on dialysis ([place])   S/P BKA (below knee amputation), right (HCC)   OSA (obstructive sleep apnea)   Seizures (HCC)   Hypotension   Metabolic encephalopathy   Acute metabolic encephalopathy   Type 2 diabetes mellitus with ESRD (end-stage renal disease) ([place])   Pneumonia   Sepsis secondary to right upper lobe pneumonia Acute on chronic hypoxia secondary to moderate-sized right effusion; 2 L oxygen at home Right-sided moderate pleural effusion -Sepsis physiology improved but still hypoxic requiring 10 L high flow nasal cannula. -Speech recommends mild aspiration risk otherwise regular/thin liquid.  Must have aspirated when he was having nausea and vomiting. -Continue azithromycin and Unasyn complete total 7-day course -Continue Supplement Oxygen.  -As needed and twice daily bronchodilators, incentive spirometer and flutter valve -Seen by Pulm- no good pocket of fluid for thoracentesis.   Recurrent nausea and vomiting, improved -Abdominal x-ray negative.  CT abdomen pelvis with p.o. contrast-negative for any acute did show right-sided effusion. -Endoscopy in September 2019-normal -Continue Reglan.   Change PPI to twice daily before meals -Outpatient gastric emptying study  ESRD on hemodialysis -Nephrology team following -Sensipar  Essential hypertension -Resume home meds when appropriate  Diabetes mellitus type 2, uncontrolled due to hyperglycemia -Insulin sliding scale and Accu-Chek -A1c 10.7  History of seizure -Changed to p.o. Keppra  Hypothyroidism -Continue Synthroid  GERD PPI daily   DVT prophylaxis: Subcu heparin Code Status: Full code Family Communication: Wife updated today Status is: Inpatient   Remains inpatient appropriate because:Hemodynamically unstable   Dispo: The patient is from: Home              Anticipated d/c is to: Home              Anticipated d/c date is: 2-3 days              Patient currently is not medically stable to d/c.  Significant amount of aspiration pneumonitis causing hypoxia.  Still requires 10 L nasal cannula.  Body mass index is 31.87 kg/m.  Pressure Injury 09/15/18 Coccyx Medial Stage 2 -  Partial thickness loss of dermis presenting as a shallow open injury with a red, pink wound bed without slough. shallow skin tear-like (Active)  09/15/18 1646  Location: Coccyx  Location Orientation: Medial  Staging: Stage 2 -  Partial thickness loss of dermis presenting as a shallow open injury with a red, pink wound bed without slough.  Wound Description (Comments): shallow skin tear-like  Present on Admission: Yes   Subjective: Unable to perform thoracentesis due to no good fluid pocket.  Still hypoxic but at rest feels okay  Review of Systems Otherwise negative except as per HPI, including: General: Denies fever, chills, night sweats or unintended weight loss. Resp: Denies hemoptysis Cardiac: Denies chest pain, palpitations, orthopnea, paroxysmal nocturnal  dyspnea. GI: Denies abdominal pain, nausea, vomiting, diarrhea or constipation GU: Denies dysuria, frequency, hesitancy or incontinence MS: Denies muscle aches, joint pain  or swelling Neuro: Denies headache, neurologic deficits (focal weakness, numbness, tingling), abnormal gait Psych: Denies anxiety, depression, SI/HI/AVH Skin: Denies new rashes or lesions ID: Denies sick contacts, exotic exposures, travel Examination:  Constitutional: Not in acute distress but on 8-10 L nasal cannula Respiratory: Bilateral diminished breath sounds at the bases Cardiovascular: Normal sinus rhythm, no rubs Abdomen: Nontender nondistended good bowel sounds Musculoskeletal: Bilateral lower extremity amputations Skin: No rashes seen Neurologic: CN 2-12 grossly intact.  And nonfocal Psychiatric: Normal judgment and insight. Alert and oriented x 3. Normal mood. Left AVG  Objective: Vitals:   10/12/19 1957 10/13/19 0120 10/13/19 0342 10/13/19 0728  BP:   115/71   Pulse:   93   Resp:   15   Temp:   98.3 F (36.8 C)   TempSrc:   Oral   SpO2: 94%  96% 97%  Weight:  (!) 106.6 kg    Height:        Intake/Output Summary (Last 24 hours) at 10/13/2019 1141 Last data filed at 10/13/2019 0900 Gross per 24 hour  Intake 1240 ml  Output 0 ml  Net 1240 ml   Filed Weights   10/12/19 0656 10/12/19 1103 10/13/19 0120  Weight: (!) 110.2 kg (!) 109 kg (!) 106.6 kg     Data Reviewed:   CBC: Recent Labs  Lab 10/08/19 2001 10/08/19 2008 10/09/19 0622 10/10/19 1219 10/12/19 0718  WBC 6.8  --  7.6 11.5* 8.3  NEUTROABS 4.7  --   --  9.0*  --   HGB 15.7 17.7* 14.8 16.7 14.6  HCT 50.2 52.0 48.6 52.5* 45.8  MCV 100.8*  --  99.6 98.3 97.2  PLT 124*  --  120* 146* 540*   Basic Metabolic Panel: Recent Labs  Lab 10/09/19 0622 10/10/19 1219 10/11/19 0514 10/12/19 0716 10/13/19 0743  NA 136 132* 133* 131* 133*  K 4.0 5.1 4.4 3.9 4.3  CL 90* 91* 91* 92* 94*  CO2 32 23 25 25 24   GLUCOSE 251* 162* 73 170* 144*  BUN 32* 47* 30* 43* 32*  CREATININE 9.27* 11.14* 8.65* 10.59* 9.08*  CALCIUM 10.5* 10.4* 9.6 9.6 9.4  MG  --   --  2.2 2.2 2.2  PHOS  --  7.0*  --  6.7*  --     GFR: Estimated Creatinine Clearance: 12 mL/min (A) (by C-G formula based on SCr of 9.08 mg/dL (H)). Liver Function Tests: Recent Labs  Lab 10/08/19 2001 10/12/19 0716  AST 22  --   ALT 18  --   ALKPHOS 80  --   BILITOT 1.3*  --   PROT 7.1  --   ALBUMIN 3.4* 3.0*   No results for input(s): LIPASE, AMYLASE in the last 168 hours. No results for input(s): AMMONIA in the last 168 hours. Coagulation Profile: Recent Labs  Lab 10/08/19 2001  INR 1.2   Cardiac Enzymes: No results for input(s): CKTOTAL, CKMB, CKMBINDEX, TROPONINI in the last 168 hours. BNP (last 3 results) No results for input(s): PROBNP in the last 8760 hours. HbA1C: No results for input(s): HGBA1C in the last 72 hours. CBG: Recent Labs  Lab 10/12/19 1210 10/12/19 1633 10/12/19 2105 10/13/19 0600 10/13/19 1102  GLUCAP 82 159* 161* 153* 115*   Lipid Profile: No results for input(s): CHOL, HDL, LDLCALC, TRIG, CHOLHDL, LDLDIRECT in the last 72 hours. Thyroid  Function Tests: Recent Labs    10/10/19 1219  TSH 6.023*   Anemia Panel: No results for input(s): VITAMINB12, FOLATE, FERRITIN, TIBC, IRON, RETICCTPCT in the last 72 hours. Sepsis Labs: Recent Labs  Lab 10/08/19 2158 10/11/19 0514  PROCALCITON  --  1.26  LATICACIDVEN 1.7  --     Recent Results (from the past 240 hour(s))  Blood Culture (routine x 2)     Status: None   Collection Time: 10/08/19  9:18 PM   Specimen: BLOOD RIGHT HAND  Result Value Ref Range Status   Specimen Description BLOOD RIGHT HAND  Final   Special Requests   Final    BOTTLES DRAWN AEROBIC AND ANAEROBIC Blood Culture results may not be optimal due to an inadequate volume of blood received in culture bottles   Culture   Final    NO GROWTH 5 DAYS Performed at [facility], [place] [address], [place], [place] [address]    Report Status 10/13/2019 FINAL  Final  Blood Culture (routine x 2)     Status: None   Collection Time: 10/08/19  9:25 PM   Specimen: BLOOD   Result Value Ref Range Status   Specimen Description BLOOD RIGHT UPPER ARM  Final   Special Requests   Final    BOTTLES DRAWN AEROBIC ONLY Blood Culture results may not be optimal due to an inadequate volume of blood received in culture bottles   Culture   Final    NO GROWTH 5 DAYS Performed at [facility], [place] [address], [place], [place] [address]    Report Status 10/13/2019 FINAL  Final  SARS Coronavirus 2 by RT PCR (hospital order, performed in [facility] hospital lab) Nasopharyngeal Nasopharyngeal Swab     Status: None   Collection Time: 10/08/19  9:58 PM   Specimen: Nasopharyngeal Swab  Result Value Ref Range Status   SARS Coronavirus 2 NEGATIVE NEGATIVE Final    Comment: (NOTE) SARS-CoV-2 target nucleic acids are NOT DETECTED.  The SARS-CoV-2 RNA is generally detectable in upper and lower respiratory specimens during the acute phase of infection. The lowest concentration of SARS-CoV-2 viral copies this assay can detect is 250 copies / mL. A negative result does not preclude SARS-CoV-2 infection and should not be used as the sole basis for treatment or other patient management decisions.  A negative result may occur with improper specimen collection / handling, submission of specimen other than nasopharyngeal swab, presence of viral mutation(s) within the areas targeted by this assay, and inadequate number of viral copies (<250 copies / mL). A negative result must be combined with clinical observations, patient history, and epidemiological information.  Fact Sheet for Patients:   StrictlyIdeas.no  Fact Sheet for Healthcare Providers: BankingDealers.co.za  This test is not yet approved or  cleared by the [place] FDA and has been authorized for detection and/or diagnosis of SARS-CoV-2 by FDA under an Emergency Use Authorization (EUA).  This EUA will remain in effect (meaning this test can be used) for the duration  of the COVID-19 declaration under Section 564(b)(1) of the Act, 21 U.S.C. section 360bbb-3(b)(1), unless the authorization is terminated or revoked sooner.  Performed at [facility], [place] [address] [place], [place] [address]          Radiology Studies: DG Chest 2 View  Result Date: 10/11/2019 CLINICAL DATA:  Follow-up pleural effusion hypotensive EXAM: CHEST - 2 VIEW COMPARISON:  10/08/2019, CT chest 08/16/2019 FINDINGS: At least moderate right-sided pleural  effusion which tracks along the right lateral chest, this is stable to slightly increased in size compared with most recent prior radiograph from 07/19. Airspace disease throughout the right lung. Consolidation left lung base with air bronchograms as before. Cardiomegaly. No pneumothorax. Left subclavian region vascular stent. IMPRESSION: 1. At least moderate right-sided pleural effusion which tracks along the right lateral chest, stable to slightly increased in size compared with most recent prior radiograph. 2. Bilateral airspace disease, right greater than left, concerning for bilateral pneumonia 3. Cardiomegaly Electronically Signed   By: Donavan Foil M.D.   On: 10/11/2019 19:20   DG CHEST PORT 1 VIEW  Result Date: 10/12/2019 CLINICAL DATA:  Follow-up right pleural effusion EXAM: PORTABLE CHEST 1 VIEW COMPARISON:  10/11/2019 FINDINGS: Cardiac shadow is enlarged but stable. Left lung remains clear. Subclavian stenting is again noted and stable. Decrease in right-sided pleural effusion is noted possibly related to the upright position. Patchy opacity is again noted throughout the right lung. IMPRESSION: Patchy right-sided opacity. Decrease in right effusion which may be positional in nature. Electronically Signed   By: Inez Catalina M.D.   On: 10/12/2019 11:55        Scheduled Meds: . calcium acetate  2,668 mg Oral TID WC  . Chlorhexidine Gluconate Cloth  6 each Topical Q0600  . cinacalcet  90 mg Oral Q M,W,F-1800  .  docusate sodium  100 mg Oral BID  . heparin  5,000 Units Subcutaneous Q8H  . insulin aspart  0-5 Units Subcutaneous QHS  . insulin aspart  0-9 Units Subcutaneous TID WC  . ipratropium-albuterol  3 mL Nebulization BID  . levothyroxine  50 mcg Oral Daily  . metoCLOPramide  5 mg Oral BID AC  . pantoprazole  40 mg Oral BID AC   Continuous Infusions: . sodium chloride 250 mL (10/12/19 1218)  . ampicillin-sulbactam (UNASYN) IV 1.5 g (10/13/19 0339)  . azithromycin 500 mg (10/13/19 0823)  . levETIRAcetam 500 mg (10/12/19 2218)     LOS: 4 days   Time spent= 35 mins    Aletha Allebach Arsenio Loader, MD [facility]  If 7PM-7AM, please contact night-coverage  10/13/2019, 11:41 AM

## 2019-10-13 NOTE — Progress Notes (Signed)
Came into patient's room and patient had [place] off.  sats were in the 8s.  Placed [place] back on patient and explained the importance of leaving O2 on.  Got sats back up to 92%.  Gave breathing treatment.  Patient's WOB was less during breathing treatment.

## 2019-10-14 DIAGNOSIS — J189 Pneumonia, unspecified organism: Secondary | ICD-10-CM | POA: Diagnosis not present

## 2019-10-14 DIAGNOSIS — N186 End stage renal disease: Secondary | ICD-10-CM | POA: Diagnosis not present

## 2019-10-14 DIAGNOSIS — R778 Other specified abnormalities of plasma proteins: Secondary | ICD-10-CM | POA: Diagnosis not present

## 2019-10-14 DIAGNOSIS — G9341 Metabolic encephalopathy: Secondary | ICD-10-CM | POA: Diagnosis not present

## 2019-10-14 LAB — BASIC METABOLIC PANEL
Anion gap: 16 — ABNORMAL HIGH (ref 5–15)
BUN: 40 mg/dL — ABNORMAL HIGH (ref 6–20)
CO2: 22 mmol/L (ref 22–32)
Calcium: 9.2 mg/dL (ref 8.9–10.3)
Chloride: 97 mmol/L — ABNORMAL LOW (ref 98–111)
Creatinine, Ser: 10.92 mg/dL — ABNORMAL HIGH (ref 0.61–1.24)
GFR calc Af Amer: 6 mL/min — ABNORMAL LOW (ref >60)
GFR calc non Af Amer: 5 mL/min — ABNORMAL LOW (ref >60)
Glucose, Bld: 124 mg/dL — ABNORMAL HIGH (ref 70–99)
Potassium: 4.5 mmol/L (ref 3.5–5.1)
Sodium: 135 mmol/L (ref 135–145)

## 2019-10-14 LAB — GLUCOSE, CAPILLARY
Glucose-Capillary: 115 mg/dL — ABNORMAL HIGH (ref 70–99)
Glucose-Capillary: 197 mg/dL — ABNORMAL HIGH (ref 70–99)
Glucose-Capillary: 117 mg/dL — ABNORMAL HIGH (ref 70–99)
Glucose-Capillary: 156 mg/dL — ABNORMAL HIGH (ref 70–99)

## 2019-10-14 LAB — AMMONIA: Ammonia: 28 umol/L (ref 9–35)

## 2019-10-14 LAB — MAGNESIUM: Magnesium: 2.2 mg/dL (ref 1.7–2.4)

## 2019-10-14 NOTE — Progress Notes (Signed)
Patient has been refusing CPAP.  No distress noted at this time.

## 2019-10-14 NOTE — Progress Notes (Signed)
[facility] Progress Note  Name: Chancey Ringel MRN: [id number] DOB: [birth year]  Chief Complaint:  Altered mental status   Subjective:  Seen in room - interactive with me today and more oriented.  Remains hypoxic on RA at times. PO intake increased Wts are bed weights and highly variable.  Review of systems:  Denies chest pain  Denies shortness  Denies n/v ------------- Background on consult:  Mclean Moya is a 54 y.o. male with a history of end-stage renal disease on hemodialysis Monday Wednesday Friday, diabetes, hypertension, and sleep apnea who presented to the hospital per chart review with altered mental status and weakness.  Patient states that his wife brought him and he is not sure why he is here.  Recently he has had hypotension at dialysis.  He was also reported as having hypotension at home.  He on chest x-ray had concern for a right-sided pneumonia.  He had a CT head which was negative for acute process but did note multiple prior infarcts.  Patient dialysis details as below.  The last treatment he left slightly above his dry weight as his course was complicated there by hypotension it appears.  I spoke with his nurse on the floor and she states that earlier he was only oriented to self in the ER.  He denies overt shortness of breath.  No n/v/d.    Intake/Output Summary (Last 24 hours) at 10/14/2019 1033 Last data filed at 10/14/2019 0600 Gross per 24 hour  Intake 1230 ml  Output -  Net 1230 ml    Vitals:  Vitals:   10/13/19 2020 10/14/19 0513 10/14/19 0626 10/14/19 0802  BP: (!) 145/94  (!) 139/74   Pulse: 96  92   Resp: 20  18   Temp: 98.5 F (36.9 C)  97.7 F (36.5 C)   TempSrc: Oral  Oral   SpO2: 100%  95% 93%  Weight:  (!) 108.4 kg    Height:         Physical Exam:  General adult male in bed in no acute distress, awake on entering room HEENT normocephalic atraumatic extraocular movements intact sclera anicteric Lungs dec BS bases  R >L, normal WOB lying flat, on 2L Heart S1S2 no rub  Abdomen soft nontender nondistended Extremities no pitting edema; bilateral BKA's   Psych normal mood and affect Neuro - oreinted to self, [facility], 2020 - more interactive than prior exams, not really conversational though Access left AVG +t/b, medial to old clotted AVF.  Medications reviewed    Labs:  BMP Latest Ref Rng & Units 10/14/2019 10/13/2019 10/12/2019  Glucose 70 - 99 mg/dL 124(H) 144(H) 170(H)  BUN 6 - 20 mg/dL 40(H) 32(H) 43(H)  Creatinine 0.61 - 1.24 mg/dL 10.92(H) 9.08(H) 10.59(H)  BUN/Creat Ratio 9 - 20 - - -  Sodium 135 - 145 mmol/L 135 133(L) 131(L)  Potassium 3.5 - 5.1 mmol/L 4.5 4.3 3.9  Chloride 98 - 111 mmol/L 97(L) 94(L) 92(L)  CO2 22 - 32 mmol/L 22 24 25   Calcium 8.9 - 10.3 mg/dL 9.2 9.4 9.6   Outpatient HD orders:  Monday Wednesday Friday [facility] 4 hours and 15 minutes BF 450 and auto 2.0 for DF EDW 88.5 kg  2K/2.25 Ca Meds: sensipar 90 mg three times a week.  Not on ESA last outpatient Hb 16.0 Last HD 7/19 and left at 89.7 kg  AV graft  Charted with some hypotension last tx    Assessment/Plan:   # RUL Pneumonia - abx  per primary team - c/b pleural effusion: pulm saw Friday, too small to tap; they will f/u Mon  # acute hypoxic resp failure  - on supplemental oxygen  - optimizing volume with HD - team treating PNA  - no e/o pulmonary edema on serial imaging  # Metabolic encephalopathy/altered mental status - Status post CT head - no acute issues.  Per primary team.- on my exam remains encephalopathic but improved somewhat today - not uremia  # End-stage renal disease on hemodialysis Monday Wednesday Friday outpatient at [facility] - HD per MWF schedule - HD tomorrow  # Hypertension - optimize volume with HD --> try 4L UF tomrorow - Agree with holding antihypertensives at this time given hypotension - this AM BP 139/75 - Seems little po intake and by I/Os he's ~ even for  admission but wt recently in the 107-109 kg from 89.7kg last HD ?? Not massively overloaded as discrepant wt suggests.   These are clearly bed weights and I don't think accurate  # Metabolic bone disease   - note that he is on Sensipar 90 mg 3 times a week at the outpatient dialysis unit.  Continue sensipar.  He is not on activated vitamin D and note that uncorrected calcium here is 10.5 and prohibitive for same.  Elevated phos a 6.7; cont outpt phoslo for now; [place] d/cd recently due to c/f iron overload.   # Seizure disorder - on anti-epileptics per primary team  # DMT2 uncontrolled - per primary team   Justin Mend, MD 10/14/2019 10:33 AM

## 2019-10-14 NOTE — Progress Notes (Signed)
PROGRESS NOTE    Howard Bell  HEN:[id number] DOB: [birth year] DOA: 10/08/2019 PCP: Horald Pollen, MD   Brief Narrative:  54 year old male with past medical history of end-stage renal disease on hemodialysis, seizures, systolic heart failure, hypertension, type 2 diabetes mellitus, obesity and bilateral BKA. Patient was noted to have severe weakness, confusion and altered mentation after hemodialysis.  Apparently patient had been experiencing nausea and vomiting about 24 hours prior to hospitalization.  Initially patient was hypotensive with concerns of right upper lobe pneumonia.  He also received dialysis during hospitalization which improved his oxygenation.   Assessment & Plan:   Principal Problem:   Community acquired pneumonia Active Problems:   Elevated troponin   ESRD on dialysis ([place])   S/P BKA (below knee amputation), right (HCC)   OSA (obstructive sleep apnea)   Seizures (HCC)   Hypotension   Metabolic encephalopathy   Acute metabolic encephalopathy   Type 2 diabetes mellitus with ESRD (end-stage renal disease) ([place])   Pneumonia   Sepsis secondary to right upper lobe pneumonia Acute on chronic hypoxia secondary to moderate-sized right effusion; 2 L oxygen at home Right-sided moderate pleural effusion Mild encephalopathy -Sepsis physiology improving but still requires 10 L high flow nasal cannula. -Speech recommends mild aspiration risk otherwise regular/thin liquid.  Must have aspirated when he was having nausea and vomiting. -Unasyn/azithromycin-complete total 7-day course -Continue Supplement Oxygen.  -As needed and twice daily bronchodilators, incentive spirometer and flutter valve -Seen by Pulm-small volume unable to tap.  We'll follow-up tomorrow -Check ammonia levels, discontinue Reglan for now.  Recurrent nausea and vomiting, improved -Abdominal x-ray negative.  CT abdomen pelvis with p.o. contrast-negative for any acute did show right-sided  effusion. -Endoscopy in September 2019-normal -PPI twice daily before meals -Hold off on Reglan due to mild encephalopathy/drowsiness -Outpatient gastric emptying study  ESRD on hemodialysis -Nephrology team following -Sensipar  Essential hypertension -Resume home meds when appropriate  Diabetes mellitus type 2, uncontrolled due to hyperglycemia -Insulin sliding scale and Accu-Chek -A1c 10.7  History of seizure -P.o. Keppra  Hypothyroidism -Continue Synthroid  GERD PPI daily   DVT prophylaxis: Subcu heparin Code Status: Full code Family Communication:  Status is: Inpatient   Remains inpatient appropriate because:Hemodynamically unstable   Dispo: The patient is from: Home              Anticipated d/c is to: Home              Anticipated d/c date is: 2-3 days              Patient currently is not medically stable to d/c.  Significant amount of aspiration pneumonitis causing hypoxia.  Still requires 10 L nasal cannula.  Body mass index is 32.41 kg/m.    Subjective: Patient is drowsy but easily arousable with appropriate interaction.  Denies any complaints.  Review of Systems Otherwise negative except as per HPI, including: General: Denies fever, chills, night sweats or unintended weight loss. Resp: Denies cough, wheezing, shortness of breath. Cardiac: Denies chest pain, palpitations, orthopnea, paroxysmal nocturnal dyspnea. GI: Denies abdominal pain, nausea, vomiting, diarrhea or constipation GU: Denies dysuria, frequency, hesitancy or incontinence MS: Denies muscle aches, joint pain or swelling Neuro: Denies headache, neurologic deficits (focal weakness, numbness, tingling), abnormal gait Psych: Denies anxiety, depression, SI/HI/AVH Skin: Denies new rashes or lesions ID: Denies sick contacts, exotic exposures, travel  Examination: Constitutional: Not in acute distress Respiratory: Diminished breath sounds bilaterally at bases Cardiovascular: Normal sinus  rhythm, no rubs Abdomen: Nontender  nondistended good bowel sounds Musculoskeletal: Lower extremity amputation noted Skin: No rashes seen Neurologic: Drowsy but exam is nonfocal Psychiatric: Poor judgment and insight, alert awake oriented to name place and the year  Left AVG  Objective: Vitals:   10/13/19 2020 10/14/19 0513 10/14/19 0626 10/14/19 0802  BP: (!) 145/94  (!) 139/74   Pulse: 96  92   Resp: 20  18   Temp: 98.5 F (36.9 C)  97.7 F (36.5 C)   TempSrc: Oral  Oral   SpO2: 100%  95% 93%  Weight:  (!) 108.4 kg    Height:        Intake/Output Summary (Last 24 hours) at 10/14/2019 0809 Last data filed at 10/14/2019 0600 Gross per 24 hour  Intake 1530 ml  Output --  Net 1530 ml   Filed Weights   10/12/19 1103 10/13/19 0120 10/14/19 0513  Weight: (!) 109 kg (!) 106.6 kg (!) 108.4 kg     Data Reviewed:   CBC: Recent Labs  Lab 10/08/19 2001 10/08/19 2008 10/09/19 0622 10/10/19 1219 10/12/19 0718  WBC 6.8  --  7.6 11.5* 8.3  NEUTROABS 4.7  --   --  9.0*  --   HGB 15.7 17.7* 14.8 16.7 14.6  HCT 50.2 52.0 48.6 52.5* 45.8  MCV 100.8*  --  99.6 98.3 97.2  PLT 124*  --  120* 146* 202*   Basic Metabolic Panel: Recent Labs  Lab 10/10/19 1219 10/11/19 0514 10/12/19 0716 10/13/19 0743 10/14/19 0633  NA 132* 133* 131* 133* 135  K 5.1 4.4 3.9 4.3 4.5  CL 91* 91* 92* 94* 97*  CO2 23 25 25 24 22   GLUCOSE 162* 73 170* 144* 124*  BUN 47* 30* 43* 32* 40*  CREATININE 11.14* 8.65* 10.59* 9.08* 10.92*  CALCIUM 10.4* 9.6 9.6 9.4 9.2  MG  --  2.2 2.2 2.2 2.2  PHOS 7.0*  --  6.7*  --   --    GFR: Estimated Creatinine Clearance: 10.1 mL/min (A) (by C-G formula based on SCr of 10.92 mg/dL (H)). Liver Function Tests: Recent Labs  Lab 10/08/19 2001 10/12/19 0716  AST 22  --   ALT 18  --   ALKPHOS 80  --   BILITOT 1.3*  --   PROT 7.1  --   ALBUMIN 3.4* 3.0*   No results for input(s): LIPASE, AMYLASE in the last 168 hours. No results for input(s): AMMONIA in  the last 168 hours. Coagulation Profile: Recent Labs  Lab 10/08/19 2001  INR 1.2   Cardiac Enzymes: No results for input(s): CKTOTAL, CKMB, CKMBINDEX, TROPONINI in the last 168 hours. BNP (last 3 results) No results for input(s): PROBNP in the last 8760 hours. HbA1C: No results for input(s): HGBA1C in the last 72 hours. CBG: Recent Labs  Lab 10/12/19 2105 10/13/19 0600 10/13/19 1102 10/13/19 2132 10/14/19 0622  GLUCAP 161* 153* 115* 203* 115*   Lipid Profile: No results for input(s): CHOL, HDL, LDLCALC, TRIG, CHOLHDL, LDLDIRECT in the last 72 hours. Thyroid Function Tests: No results for input(s): TSH, T4TOTAL, FREET4, T3FREE, THYROIDAB in the last 72 hours. Anemia Panel: No results for input(s): VITAMINB12, FOLATE, FERRITIN, TIBC, IRON, RETICCTPCT in the last 72 hours. Sepsis Labs: Recent Labs  Lab 10/08/19 2158 10/11/19 0514  PROCALCITON  --  1.26  LATICACIDVEN 1.7  --     Recent Results (from the past 240 hour(s))  Blood Culture (routine x 2)     Status: None   Collection Time:  10/08/19  9:18 PM   Specimen: BLOOD RIGHT HAND  Result Value Ref Range Status   Specimen Description BLOOD RIGHT HAND  Final   Special Requests   Final    BOTTLES DRAWN AEROBIC AND ANAEROBIC Blood Culture results may not be optimal due to an inadequate volume of blood received in culture bottles   Culture   Final    NO GROWTH 5 DAYS Performed at [facility], [place] [address] [place], [place] [address]    Report Status 10/13/2019 FINAL  Final  Blood Culture (routine x 2)     Status: None   Collection Time: 10/08/19  9:25 PM   Specimen: BLOOD  Result Value Ref Range Status   Specimen Description BLOOD RIGHT UPPER ARM  Final   Special Requests   Final    BOTTLES DRAWN AEROBIC ONLY Blood Culture results may not be optimal due to an inadequate volume of blood received in culture bottles   Culture   Final    NO GROWTH 5 DAYS Performed at [facility], [place] [address],  [place], [place] [address]    Report Status 10/13/2019 FINAL  Final  SARS Coronavirus 2 by RT PCR (hospital order, performed in [facility] hospital lab) Nasopharyngeal Nasopharyngeal Swab     Status: None   Collection Time: 10/08/19  9:58 PM   Specimen: Nasopharyngeal Swab  Result Value Ref Range Status   SARS Coronavirus 2 NEGATIVE NEGATIVE Final    Comment: (NOTE) SARS-CoV-2 target nucleic acids are NOT DETECTED.  The SARS-CoV-2 RNA is generally detectable in upper and lower respiratory specimens during the acute phase of infection. The lowest concentration of SARS-CoV-2 viral copies this assay can detect is 250 copies / mL. A negative result does not preclude SARS-CoV-2 infection and should not be used as the sole basis for treatment or other patient management decisions.  A negative result may occur with improper specimen collection / handling, submission of specimen other than nasopharyngeal swab, presence of viral mutation(s) within the areas targeted by this assay, and inadequate number of viral copies (<250 copies / mL). A negative result must be combined with clinical observations, patient history, and epidemiological information.  Fact Sheet for Patients:   StrictlyIdeas.no  Fact Sheet for Healthcare Providers: BankingDealers.co.za  This test is not yet approved or  cleared by the [place] FDA and has been authorized for detection and/or diagnosis of SARS-CoV-2 by FDA under an Emergency Use Authorization (EUA).  This EUA will remain in effect (meaning this test can be used) for the duration of the COVID-19 declaration under Section 564(b)(1) of the Act, 21 U.S.C. section 360bbb-3(b)(1), unless the authorization is terminated or revoked sooner.  Performed at [facility], [place] [address], [place],  [address]          Radiology Studies: DG CHEST PORT 1 VIEW  Result Date: 10/12/2019 CLINICAL DATA:   Follow-up right pleural effusion EXAM: PORTABLE CHEST 1 VIEW COMPARISON:  10/11/2019 FINDINGS: Cardiac shadow is enlarged but stable. Left lung remains clear. Subclavian stenting is again noted and stable. Decrease in right-sided pleural effusion is noted possibly related to the upright position. Patchy opacity is again noted throughout the right lung. IMPRESSION: Patchy right-sided opacity. Decrease in right effusion which may be positional in nature. Electronically Signed   By: Inez Catalina M.D.   On: 10/12/2019 11:55        Scheduled Meds: . calcium acetate  2,668 mg Oral TID WC  .  Chlorhexidine Gluconate Cloth  6 each Topical Q0600  . cinacalcet  90 mg Oral Q M,W,F-1800  . docusate sodium  100 mg Oral BID  . heparin  5,000 Units Subcutaneous Q8H  . insulin aspart  0-5 Units Subcutaneous QHS  . insulin aspart  0-9 Units Subcutaneous TID WC  . ipratropium-albuterol  3 mL Nebulization BID  . levETIRAcetam  500 mg Oral QHS  . levothyroxine  50 mcg Oral Daily  . metoCLOPramide  5 mg Oral BID AC  . pantoprazole  40 mg Oral BID AC   Continuous Infusions: . sodium chloride 250 mL (10/12/19 1218)  . ampicillin-sulbactam (UNASYN) IV 1.5 g (10/14/19 0335)  . azithromycin 500 mg (10/13/19 0823)     LOS: 5 days   Time spent= 35 mins    Karleen Seebeck Arsenio Loader, MD [facility]  If 7PM-7AM, please contact night-coverage  10/14/2019, 8:09 AM

## 2019-10-15 DIAGNOSIS — G4733 Obstructive sleep apnea (adult) (pediatric): Secondary | ICD-10-CM | POA: Diagnosis not present

## 2019-10-15 DIAGNOSIS — J690 Pneumonitis due to inhalation of food and vomit: Secondary | ICD-10-CM

## 2019-10-15 DIAGNOSIS — N186 End stage renal disease: Secondary | ICD-10-CM | POA: Diagnosis not present

## 2019-10-15 DIAGNOSIS — R778 Other specified abnormalities of plasma proteins: Secondary | ICD-10-CM | POA: Diagnosis not present

## 2019-10-15 DIAGNOSIS — G9341 Metabolic encephalopathy: Secondary | ICD-10-CM | POA: Diagnosis not present

## 2019-10-15 DIAGNOSIS — I509 Heart failure, unspecified: Secondary | ICD-10-CM

## 2019-10-15 LAB — GLUCOSE, CAPILLARY
Glucose-Capillary: 228 mg/dL — ABNORMAL HIGH (ref 70–99)
Glucose-Capillary: 205 mg/dL — ABNORMAL HIGH (ref 70–99)
Glucose-Capillary: 126 mg/dL — ABNORMAL HIGH (ref 70–99)
Glucose-Capillary: 65 mg/dL — ABNORMAL LOW (ref 70–99)
Glucose-Capillary: 67 mg/dL — ABNORMAL LOW (ref 70–99)
Glucose-Capillary: 69 mg/dL — ABNORMAL LOW (ref 70–99)
Glucose-Capillary: 74 mg/dL (ref 70–99)
Glucose-Capillary: 207 mg/dL — ABNORMAL HIGH (ref 70–99)

## 2019-10-15 LAB — CBC
HCT: 47.4 % (ref 39.0–52.0)
Hemoglobin: 15.4 g/dL (ref 13.0–17.0)
MCH: 31.9 pg (ref 26.0–34.0)
MCHC: 32.5 g/dL (ref 30.0–36.0)
MCV: 98.1 fL (ref 80.0–100.0)
Platelets: 122 10*3/uL — ABNORMAL LOW (ref 150–400)
RBC: 4.83 MIL/uL (ref 4.22–5.81)
RDW: 18.2 % — ABNORMAL HIGH (ref 11.5–15.5)
WBC: 6.3 10*3/uL (ref 4.0–10.5)
nRBC: 0.3 % — ABNORMAL HIGH (ref 0.0–0.2)

## 2019-10-15 LAB — BASIC METABOLIC PANEL
Anion gap: 15 (ref 5–15)
BUN: 47 mg/dL — ABNORMAL HIGH (ref 6–20)
CO2: 23 mmol/L (ref 22–32)
Calcium: 9.1 mg/dL (ref 8.9–10.3)
Chloride: 97 mmol/L — ABNORMAL LOW (ref 98–111)
Creatinine, Ser: 12.88 mg/dL — ABNORMAL HIGH (ref 0.61–1.24)
GFR calc Af Amer: 5 mL/min — ABNORMAL LOW (ref >60)
GFR calc non Af Amer: 4 mL/min — ABNORMAL LOW (ref >60)
Glucose, Bld: 214 mg/dL — ABNORMAL HIGH (ref 70–99)
Potassium: 4 mmol/L (ref 3.5–5.1)
Sodium: 135 mmol/L (ref 135–145)

## 2019-10-15 LAB — MAGNESIUM: Magnesium: 2.3 mg/dL (ref 1.7–2.4)

## 2019-10-15 MED ORDER — DEXTROSE 50 % IV SOLN
INTRAVENOUS | Status: AC
  Administered 2019-10-15: 25 mL via INTRAVENOUS
  Filled 2019-10-15: qty 50

## 2019-10-15 MED ORDER — RENA-VITE PO TABS
1.0000 | ORAL_TABLET | Freq: Every day | ORAL | Status: DC
  Administered 2019-10-15 – 2019-10-16 (×2): 1 via ORAL
  Filled 2019-10-15 (×2): qty 1

## 2019-10-15 MED ORDER — DEXTROSE 50 % IV SOLN: 25.0000 mL | Freq: Once | INTRAVENOUS | Status: AC

## 2019-10-15 MED ORDER — INSULIN GLARGINE 100 UNIT/ML ~~LOC~~ SOLN
5.0000 [IU] | Freq: Every day | SUBCUTANEOUS | Status: DC
  Administered 2019-10-15: 5 [IU] via SUBCUTANEOUS
  Filled 2019-10-15 (×2): qty 0.05

## 2019-10-15 NOTE — Progress Notes (Signed)
PROGRESS NOTE    Howard Bell  AGT:[id number] DOB: 10-08-1965 DOA: 10/08/2019 PCP: Horald Pollen, MD   Brief Narrative:  54 year old male with past medical history of end-stage renal disease on hemodialysis, seizures, systolic heart failure, hypertension, type 2 diabetes mellitus, obesity and bilateral BKA. Patient was noted to have severe weakness, confusion and altered mentation after hemodialysis.  Apparently patient had been experiencing nausea and vomiting about 24 hours prior to hospitalization.  Initially patient was hypotensive with concerns of right upper lobe pneumonia.  He also received dialysis during hospitalization which improved his oxygenation.  Due to concerns of aspiration pneumonia, he has been on Unasyn and azithromycin.  Nephrology and pulmonary team following.   Assessment & Plan:   Principal Problem:   Community acquired pneumonia Active Problems:   Elevated troponin   ESRD on dialysis ([place])   S/P BKA (below knee amputation), right (HCC)   OSA (obstructive sleep apnea)   Seizures (HCC)   Hypotension   Metabolic encephalopathy   Acute metabolic encephalopathy   Type 2 diabetes mellitus with ESRD (end-stage renal disease) ([place])   Pneumonia   Sepsis secondary to right upper lobe pneumonia Acute on chronic hypoxia secondary to moderate-sized right effusion; 2 L oxygen at home Right-sided moderate pleural effusion Mild encephalopathy -Sepsis physiology improving.  Still requiring 7 L high flow nasal cannula, advised nursing staff to wean off.  Goal O2 is 90% or greater. -Speech recommends mild aspiration risk otherwise regular/thin liquid.  Must have aspirated when he was having nausea and vomiting. -Unasyn/azithromycin-complete total 7-day course -Continue Supplement Oxygen.  -As needed and twice daily bronchodilators, incentive spirometer and flutter valve -Pulmonary team to reevaluate today -Ammonia normal, Reglan discontinued  Recurrent nausea  and vomiting, improved -Abdominal x-ray negative.  CT abdomen pelvis with p.o. contrast-negative for any acute did show right-sided effusion. -Endoscopy in September 2019-normal -PPI twice daily before meals -Reglan discontinued due to mild encephalopathy/drowsiness -Outpatient gastric emptying study  ESRD on hemodialysis -Nephrology team following -Sensipar  Essential hypertension -Resume home meds when appropriate  Diabetes mellitus type 2, uncontrolled due to hyperglycemia -Insulin sliding scale and Accu-Chek -A1c 10.7 -Lantus 5 units daily  History of seizure -P.o. Keppra  Hypothyroidism -Continue Synthroid  GERD PPI daily   DVT prophylaxis: Subcu heparin Code Status: Full code Family Communication:  Status is: Inpatient   Remains inpatient appropriate because:Hemodynamically unstable   Dispo: The patient is from: Home              Anticipated d/c is to: Home              Anticipated d/c date is: 2-3 days              Patient currently is not medically stable to d/c.  Significant amount of aspiration pneumonitis causing hypoxia.  Still requires 7+ L nasal cannula.  Body mass index is 32.96 kg/m.    Subjective: Patient is more awake but keeps taking off his oxygen and desaturates down to 85%.  Requiring about 7 L of high flow nasal cannula to maintain about 93-94%.  Review of Systems Otherwise negative except as per HPI, including: General: Denies fever, chills, night sweats or unintended weight loss. Resp: Denies hemoptysis Cardiac: Denies chest pain, palpitations, orthopnea, paroxysmal nocturnal dyspnea. GI: Denies abdominal pain, nausea, vomiting, diarrhea or constipation GU: Denies dysuria, frequency, hesitancy or incontinence MS: Denies muscle aches, joint pain or swelling Neuro: Denies headache, neurologic deficits (focal weakness, numbness, tingling), abnormal gait Psych: Denies anxiety,  depression, SI/HI/AVH Skin: Denies new rashes or lesions ID:  Denies sick contacts, exotic exposures, travel  Examination: Constitutional: Not in acute distress Respiratory: Bilateral diminished breath sounds at the bases Cardiovascular: Normal sinus rhythm, no rubs Abdomen: Nontender nondistended good bowel sounds Musculoskeletal: Bilateral BKA Skin: No rashes seen Neurologic: CN 2-12 grossly intact.  And nonfocal Psychiatric: Alert awake oriented X2-3, poor judgment and insight. Left AVG  Objective: Vitals:   10/15/19 0745 10/15/19 1000 10/15/19 1004 10/15/19 1024  BP:      Pulse:   99 98  Resp:      Temp:      TempSrc:      SpO2: 96% (!) 85% 92% 95%  Weight:      Height:        Intake/Output Summary (Last 24 hours) at 10/15/2019 1058 Last data filed at 10/15/2019 0835 Gross per 24 hour  Intake 1250 ml  Output 0 ml  Net 1250 ml   Filed Weights   10/13/19 0120 10/14/19 0513 10/15/19 0525  Weight: (!) 106.6 kg (!) 108.4 kg (!) 110.2 kg     Data Reviewed:   CBC: Recent Labs  Lab 10/08/19 2001 10/08/19 2001 10/08/19 2008 10/09/19 0622 10/10/19 1219 10/12/19 0718 10/15/19 0537  WBC 6.8  --   --  7.6 11.5* 8.3 6.3  NEUTROABS 4.7  --   --   --  9.0*  --   --   HGB 15.7   < > 17.7* 14.8 16.7 14.6 15.4  HCT 50.2   < > 52.0 48.6 52.5* 45.8 47.4  MCV 100.8*  --   --  99.6 98.3 97.2 98.1  PLT 124*  --   --  120* 146* 129* 122*   < > = values in this interval not displayed.   Basic Metabolic Panel: Recent Labs  Lab 10/10/19 1219 10/10/19 1219 10/11/19 0514 10/12/19 0716 10/13/19 0743 10/14/19 0633 10/15/19 0537  NA 132*   < > 133* 131* 133* 135 135  K 5.1   < > 4.4 3.9 4.3 4.5 4.0  CL 91*   < > 91* 92* 94* 97* 97*  CO2 23   < > 25 25 24 22 23   GLUCOSE 162*   < > 73 170* 144* 124* 214*  BUN 47*   < > 30* 43* 32* 40* 47*  CREATININE 11.14*   < > 8.65* 10.59* 9.08* 10.92* 12.88*  CALCIUM 10.4*   < > 9.6 9.6 9.4 9.2 9.1  MG  --   --  2.2 2.2 2.2 2.2 2.3  PHOS 7.0*  --   --  6.7*  --   --   --    < > = values in this  interval not displayed.   GFR: Estimated Creatinine Clearance: 8.6 mL/min (A) (by C-G formula based on SCr of 12.88 mg/dL (H)). Liver Function Tests: Recent Labs  Lab 10/08/19 2001 10/12/19 0716  AST 22  --   ALT 18  --   ALKPHOS 80  --   BILITOT 1.3*  --   PROT 7.1  --   ALBUMIN 3.4* 3.0*   No results for input(s): LIPASE, AMYLASE in the last 168 hours. Recent Labs  Lab 10/14/19 0845  AMMONIA 28   Coagulation Profile: Recent Labs  Lab 10/08/19 2001  INR 1.2   Cardiac Enzymes: No results for input(s): CKTOTAL, CKMB, CKMBINDEX, TROPONINI in the last 168 hours. BNP (last 3 results) No results for input(s): PROBNP in the last 8760 hours.  HbA1C: No results for input(s): HGBA1C in the last 72 hours. CBG: Recent Labs  Lab 10/14/19 1138 10/14/19 1659 10/14/19 2117 10/15/19 0606 10/15/19 0817  GLUCAP 197* 117* 156* 228* 205*   Lipid Profile: No results for input(s): CHOL, HDL, LDLCALC, TRIG, CHOLHDL, LDLDIRECT in the last 72 hours. Thyroid Function Tests: No results for input(s): TSH, T4TOTAL, FREET4, T3FREE, THYROIDAB in the last 72 hours. Anemia Panel: No results for input(s): VITAMINB12, FOLATE, FERRITIN, TIBC, IRON, RETICCTPCT in the last 72 hours. Sepsis Labs: Recent Labs  Lab 10/08/19 2158 10/11/19 0514  PROCALCITON  --  1.26  LATICACIDVEN 1.7  --     Recent Results (from the past 240 hour(s))  Blood Culture (routine x 2)     Status: None   Collection Time: 10/08/19  9:18 PM   Specimen: BLOOD RIGHT HAND  Result Value Ref Range Status   Specimen Description BLOOD RIGHT HAND  Final   Special Requests   Final    BOTTLES DRAWN AEROBIC AND ANAEROBIC Blood Culture results may not be optimal due to an inadequate volume of blood received in culture bottles   Culture   Final    NO GROWTH 5 DAYS Performed at [facility], [place] [address], [place], [place] [address]    Report Status 10/13/2019 FINAL  Final  Blood Culture (routine x 2)     Status: None    Collection Time: 10/08/19  9:25 PM   Specimen: BLOOD  Result Value Ref Range Status   Specimen Description BLOOD RIGHT UPPER ARM  Final   Special Requests   Final    BOTTLES DRAWN AEROBIC ONLY Blood Culture results may not be optimal due to an inadequate volume of blood received in culture bottles   Culture   Final    NO GROWTH 5 DAYS Performed at [facility], [place] [address], [place], [place] [address]    Report Status 10/13/2019 FINAL  Final  SARS Coronavirus 2 by RT PCR (hospital order, performed in [facility] hospital lab) Nasopharyngeal Nasopharyngeal Swab     Status: None   Collection Time: 10/08/19  9:58 PM   Specimen: Nasopharyngeal Swab  Result Value Ref Range Status   SARS Coronavirus 2 NEGATIVE NEGATIVE Final    Comment: (NOTE) SARS-CoV-2 target nucleic acids are NOT DETECTED.  The SARS-CoV-2 RNA is generally detectable in upper and lower respiratory specimens during the acute phase of infection. The lowest concentration of SARS-CoV-2 viral copies this assay can detect is 250 copies / mL. A negative result does not preclude SARS-CoV-2 infection and should not be used as the sole basis for treatment or other patient management decisions.  A negative result may occur with improper specimen collection / handling, submission of specimen other than nasopharyngeal swab, presence of viral mutation(s) within the areas targeted by this assay, and inadequate number of viral copies (<250 copies / mL). A negative result must be combined with clinical observations, patient history, and epidemiological information.  Fact Sheet for Patients:   StrictlyIdeas.no  Fact Sheet for Healthcare Providers: BankingDealers.co.za  This test is not yet approved or  cleared by the [place] FDA and has been authorized for detection and/or diagnosis of SARS-CoV-2 by FDA under an Emergency Use Authorization (EUA).  This EUA will  remain in effect (meaning this test can be used) for the duration of the COVID-19 declaration under Section 564(b)(1) of the Act, 21 U.S.C. section 360bbb-3(b)(1), unless the authorization is terminated or revoked sooner.  Performed  at [facility], [place] [address], [place], [place] [address]          Radiology Studies: No results found.      Scheduled Meds: . calcium acetate  2,668 mg Oral TID WC  . Chlorhexidine Gluconate Cloth  6 each Topical Q0600  . cinacalcet  90 mg Oral Q M,W,F-1800  . docusate sodium  100 mg Oral BID  . heparin  5,000 Units Subcutaneous Q8H  . insulin aspart  0-5 Units Subcutaneous QHS  . insulin aspart  0-9 Units Subcutaneous TID WC  . ipratropium-albuterol  3 mL Nebulization BID  . levETIRAcetam  500 mg Oral QHS  . levothyroxine  50 mcg Oral Daily  . multivitamin  1 tablet Oral QHS  . pantoprazole  40 mg Oral BID AC   Continuous Infusions: . sodium chloride 250 mL (10/12/19 1218)  . ampicillin-sulbactam (UNASYN) IV 1.5 g (10/15/19 0404)  . azithromycin 500 mg (10/15/19 0922)     LOS: 6 days   Time spent= 35 mins    Olando Willems Arsenio Loader, MD [facility]  If 7PM-7AM, please contact night-coverage  10/15/2019, 10:58 AM

## 2019-10-15 NOTE — Progress Notes (Signed)
   10/15/19 1630  Assess: MEWS Score  BP (!) 171/51  Pulse Rate (!) 106  Assess: MEWS Score  MEWS Temp 0  MEWS Systolic 0  MEWS Pulse 1  MEWS RR 1  MEWS LOC 0  MEWS Score 2  MEWS Score Color Yellow   -MEWS score fired YELLOW due to increased HR. -Pt. is in HD

## 2019-10-15 NOTE — Care Management Important Message (Signed)
Important Message  Patient Details  Name: Howard Bell MRN: [id number] Date of Birth: [birth year]   Medicare Important Message Given:  Yes     Shelda Altes 10/15/2019, 8:56 AM

## 2019-10-15 NOTE — Progress Notes (Addendum)
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 Developed progressive respiratory failure on 7/23>PCCM consulted for further evaluation.  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 Workup at that time revealed a right perihilar infiltrate and small right sided effusion.  Also of note, he has been struggling with completing his HD sessions due to hypotension.  He was noted to have progressive respiratory failure on 7/23. CXR, obtained following HD session today, consistent with progression of the right pleural effusion.  On arrival to his room, pt had just returned from dialysis. He did not have any oxygen on at that time and O2 sats were in the mid 70s. He was placed on 9L supplemental oxygen and O2 sats increased to mid 90s. Mental status improved with oxygenation but he was still fairly lethargic. He denies pain or feeling short of breath.  Baseline O2 requirement is 2L.  Past Medical History  ESRD on HD, combined heart failure (EF 20-25%), T2DM, OSA, seizures, CVA   Significant Hospital Events   7/19 admission 7/23 PCCM consult  Consults:  Nephrology PCCM  Procedures:  n/a  Significant Diagnostic Tests:  7/19 CXR>>moderate severity right perihilar infiltrate on the right. Small right pleural effusion 7/22 CXR>> bilateral airspace disease R>L. Moderate right pleural effusion.  7/23 CXR>>decrease in right pleural effusion (possibly positional). Patchy opacities throughout right lung.  7/20 echocardiogram: LVEF 20-25% with global LV hypokinesis and severe concentric LVH.  Interventricular septal flattening. Severely reduced RV systolic function; severely enlarged RV. Mildly elevated PAP; RV systolic pressure 78GNFA. Degenerative MV without significant flow abnormality. Mild-mod tricuspid regurgitation. Mild-moderate AV sclerosis/calcification without evidence for stenosis. IVC dilated in size with <50% respiratory variability suggesting RAP of 2mmHg.   Micro Data:  7/19 blood cultures x2>>NG  Antimicrobials:  Rocephin 7/20>7/21 Azithromycin 7/21>> Unasyn 7/21>>7/25  Interim history/subjective:  Down to 5L supplemental oxygen this morning. No major events over the weekend.  Objective   Blood pressure (!) 154/82, pulse 98, temperature 97.7 F (36.5 C), temperature source Oral, resp. rate 18, height 6' (1.829 m), weight (!) 110.2 kg, SpO2 95 %.        Intake/Output Summary (Last 24 hours) at 10/15/2019 1037 Last data filed at 10/15/2019 0835 Gross per 24 hour  Intake 1250 ml  Output 0 ml  Net 1250 ml   Filed Weights   10/13/19 0120 10/14/19 0513 10/15/19 0525  Weight: (!) 106.6 kg (!) 108.4 kg (!) 110.2 kg    Examination: General: chronically ill appearing in NAD HEENT: [place] Cardiac: RRR.  Pulm: breathing comfortably on 5L supplemental oxygen. Diminished sounds at lower half of lungs bilaterally R>L.  GI: nontender to palpation Skin: no rash Extrem: bilateral AKA Neuro: limited due to patient cooperation but no acute abn notable.  Resolved Hospital Problem list   n/a  Assessment & Plan:   Acute on chronic respiratory failure due to aspiration pna vs pneumonitis and right pleural effusion. 2L supplemental oxygen requirement at baseline. PCCM consulted for progressive resp failure and possible need for thora due to pleural effusion. He did not have significant enough  volume on Friday to be able to tap safely.  Now down to 5L from 9L on Friday. Lung sounds are still diminished however suspect this to continue to improve with volume removal at HD.   He remains afebrile and without a leukocytosis making an empyema less likely.  Plan Continue current management for treatment of aspiration pneumonia with unasyn. Likely ok to discontinue azithromycin. Titrate supplemental oxygen to maintain O2 saturations 92-98% Continue aspiration precautions Given the improvement in his respiratory status, would hold off on further invasive management at this time. If he develops s/s of infection, would obtain another chest CT to evaluate for empyema.  Rest per primary/nephrology  ESRD on HD MWF. Chart review indicating hypotension precluding complete HD sessions. Seizures. On keppra.  Hypothyroidism: on synthroid. T2DM. On mealtime and HS SSI Severe combined heart failure/ICM. Previously only diastolic failure, found to have new reduction in EF to 25-30% in May 2021. He was considered a poor revascularization candidate at that time. He was started on coreg, imdur and hydralazine at that time. Most recent echo from 10/09/19 showing LVEF of 20-25%. Current home medications are coreg 6.25mg , bidil 20-37.5mg  however these are being held due to hypotension.   *See attending attestation for final recommendations.  **PCCM will sign off at this time. Please feel free to reach out for any questions or for worsening symptoms--please page using the [facility] on call pager [phone]. Thank you for allowing me to participate in your patient's care.**    Best practice:  Diet: renal/carb modified Pain/Anxiety/Delirium protocol (if indicated): per primary VAP protocol (if indicated): n/a DVT prophylaxis: subcut heparin GI prophylaxis: PPI Glucose control: SSI Mobility: BR Code Status: Full Family Communication: per primary Disposition: 3E  Labs   CBC: Recent Labs  Lab 10/08/19 2001 10/08/19 2001 10/08/19 2008 10/09/19 0622 10/10/19 1219 10/12/19 0718 10/15/19 0537  WBC 6.8  --   --  7.6 11.5* 8.3 6.3  NEUTROABS 4.7  --   --   --  9.0*  --   --    HGB 15.7   < > 17.7* 14.8 16.7 14.6 15.4  HCT 50.2   < > 52.0 48.6 52.5* 45.8 47.4  MCV 100.8*  --   --  99.6 98.3 97.2 98.1  PLT 124*  --   --  120* 146* 129* 122*   < > = values in this interval not displayed.    Basic Metabolic Panel: Recent Labs  Lab 10/10/19 1219 10/10/19 1219 10/11/19 0514 10/12/19 0716 10/13/19 0743 10/14/19 0633 10/15/19 0537  NA 132*   < > 133* 131* 133* 135 135  K 5.1   < > 4.4 3.9 4.3 4.5 4.0  CL 91*   < > 91* 92* 94* 97* 97*  CO2 23   < > 25 25 24 22 23   GLUCOSE 162*   < > 73 170* 144* 124* 214*  BUN 47*   < > 30* 43* 32* 40* 47*  CREATININE 11.14*   < > 8.65* 10.59* 9.08* 10.92* 12.88*  CALCIUM 10.4*   < > 9.6 9.6 9.4 9.2 9.1  MG  --   --  2.2 2.2 2.2 2.2 2.3  PHOS 7.0*  --   --  6.7*  --   --   --    < > = values in this interval not displayed.   GFR: Estimated Creatinine Clearance: 8.6 mL/min (A) (by C-G formula based on SCr of 12.88 mg/dL (H)). Recent Labs  Lab 10/08/19 2001 10/08/19  2158 10/09/19 0622 10/10/19 1219 10/11/19 0514 10/12/19 0718 10/15/19 0537  PROCALCITON  --   --   --   --  1.26  --   --   WBC   < >  --  7.6 11.5*  --  8.3 6.3  LATICACIDVEN  --  1.7  --   --   --   --   --    < > = values in this interval not displayed.    Liver Function Tests: Recent Labs  Lab 10/08/19 2001 10/12/19 0716  AST 22  --   ALT 18  --   ALKPHOS 80  --   BILITOT 1.3*  --   PROT 7.1  --   ALBUMIN 3.4* 3.0*     Mitzi Hansen, MD [facility] PGY-2 10/15/19  10:37 AM   PCCM:  54 yo M, ESRD on HD MWF, AHRF, fluid overload, right pleural effusion small.   BP (!) 158/90 (BP Location: Right Wrist)   Pulse 96   Temp 98 F (36.7 C) (Oral)   Resp (!) 25   Ht 6' (1.829 m)   Wt (!) 114.3 kg   SpO2 94%   BMI 34.18 kg/m   Gen: chronically ill appearing male, resting in bed, getting HD  HENT: NCAT  Heart: RRR, s1 s2 Lungs: diminished in the BL Bases   Labs reviewed   A: AHRF, pleural effusion, Acute  on chronic systolic diastolic heart failure  ESRD on HD for volume management   P:  Agree to come of abx  Fluid management with HD  No need for thora at this time.   Pulmonary will sign off at this time.   Garner Nash, DO [facility] 10/15/2019 2:32 PM

## 2019-10-15 NOTE — Progress Notes (Signed)
PT Cancellation Note  Patient Details Name: Korde Jeppsen MRN: [id number] DOB: 03-May-1965   Cancelled Treatment:    Reason Eval/Treat Not Completed: Patient at procedure or test/unavailable Pt currently at HD. Will follow up as schedule allows.   Reuel Derby, PT, DPT  Acute Rehabilitation Services  Pager: [phone] Office: [phone]    Rudean Hitt 10/15/2019, 2:26 PM

## 2019-10-15 NOTE — Progress Notes (Signed)
Patient refused CPAP for tonight. RT instructed patient to have RT called if he changes his mind. RT will monitor as needed. 

## 2019-10-15 NOTE — Procedures (Signed)
Patient seen on Hemodialysis. BP (!) 158/90 (BP Location: Right Wrist)   Pulse 96   Temp 98 F (36.7 C) (Oral)   Resp (!) 25   Ht 6' (1.829 m)   Wt (!) 114.3 kg   SpO2 94%   BMI 34.18 kg/m   QB 400, UF goal 4L Tolerating treatment without complaints at this time.  Elmarie Shiley MD [facility]. Office # [phone] 2:34 PM

## 2019-10-15 NOTE — Progress Notes (Signed)
Patient ID: Becker Christopher, male   DOB: [birth year], 54 y.o.   MRN: [id number]  [facility] Progress Note   Assessment/ Plan:   1.  Metabolic encephalopathy/altered mental status: Suspected to be secondary to sepsis from right upper lobe pneumonia with concomitant acute hypoxic respiratory failure.  On antimicrobial coverage with Unasyn/azithromycin as well as bronchodilators and incentive spirometer. 2. ESRD: Usually on a Monday/Wednesday/Friday dialysis schedule and will undertake hemodialysis later today.  Attempting volume unloading with ultrafiltration. 3. Anemia: Hemoglobin and hematocrit within normal range, not on ESA. 4. CKD-MBD: Calcium level acceptable with elevated phosphorus, continue calcium acetate for phosphorus binding. 5. Nutrition: Continue renal diet/renal multivitamin 6. Hypertension: Blood pressure marginally elevated, attempting ultrafiltration with hemodialysis for volume unloading.  Subjective:   Without acute events overnight, he does not voice any complaints and is disoriented to time and place.   Objective:   BP (!) 154/82 (BP Location: Right Wrist)   Pulse 94   Temp 97.7 F (36.5 C) (Oral)   Resp 18   Ht 6' (1.829 m)   Wt (!) 110.2 kg   SpO2 96%   BMI 32.96 kg/m   Physical Exam: Gen: Appears comfortable sitting up in bed.  Minimally engaging in conversation with slow responses. 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  Left upper arm AVG-pulsatile  Labs: BMET Recent Labs  Lab 10/09/19 0622 10/10/19 1219 10/11/19 0514 10/12/19 0716 10/13/19 0743 10/14/19 0633 10/15/19 0537  NA 136 132* 133* 131* 133* 135 135  K 4.0 5.1 4.4 3.9 4.3 4.5 4.0  CL 90* 91* 91* 92* 94* 97* 97*  CO2 32 23 25 25 24 22 23   GLUCOSE 251* 162* 73 170* 144* 124* 214*  BUN 32*  47* 30* 43* 32* 40* 47*  CREATININE 9.27* 11.14* 8.65* 10.59* 9.08* 10.92* 12.88*  CALCIUM 10.5* 10.4* 9.6 9.6 9.4 9.2 9.1  PHOS  --  7.0*  --  6.7*  --   --   --    CBC Recent Labs  Lab 10/08/19 2001 10/08/19 2008 10/09/19 0622 10/10/19 1219 10/12/19 0718 10/15/19 0537  WBC 6.8   < > 7.6 11.5* 8.3 6.3  NEUTROABS 4.7  --   --  9.0*  --   --   HGB 15.7   < > 14.8 16.7 14.6 15.4  HCT 50.2   < > 48.6 52.5* 45.8 47.4  MCV 100.8*   < > 99.6 98.3 97.2 98.1  PLT 124*   < > 120* 146* 129* 122*   < > = values in this interval not displayed.     Medications:    . calcium acetate  2,668 mg Oral TID WC  . Chlorhexidine Gluconate Cloth  6 each Topical Q0600  . cinacalcet  90 mg Oral Q M,W,F-1800  . docusate sodium  100 mg Oral BID  . heparin  5,000 Units Subcutaneous Q8H  . insulin aspart  0-5 Units Subcutaneous QHS  . insulin aspart  0-9 Units Subcutaneous TID WC  . ipratropium-albuterol  3 mL Nebulization BID  . levETIRAcetam  500 mg Oral QHS  . levothyroxine  50 mcg Oral Daily  . pantoprazole  40 mg Oral BID AC   Elmarie Shiley, MD 10/15/2019, 9:50 AM

## 2019-10-16 ENCOUNTER — Ambulatory Visit: Payer: Medicare Other | Admitting: Endocrinology

## 2019-10-16 DIAGNOSIS — N186 End stage renal disease: Secondary | ICD-10-CM | POA: Diagnosis not present

## 2019-10-16 DIAGNOSIS — R778 Other specified abnormalities of plasma proteins: Secondary | ICD-10-CM | POA: Diagnosis not present

## 2019-10-16 DIAGNOSIS — G9341 Metabolic encephalopathy: Secondary | ICD-10-CM | POA: Diagnosis not present

## 2019-10-16 DIAGNOSIS — J189 Pneumonia, unspecified organism: Secondary | ICD-10-CM | POA: Diagnosis not present

## 2019-10-16 LAB — GLUCOSE, CAPILLARY
Glucose-Capillary: 143 mg/dL — ABNORMAL HIGH (ref 70–99)
Glucose-Capillary: 163 mg/dL — ABNORMAL HIGH (ref 70–99)
Glucose-Capillary: 169 mg/dL — ABNORMAL HIGH (ref 70–99)
Glucose-Capillary: 208 mg/dL — ABNORMAL HIGH (ref 70–99)

## 2019-10-16 NOTE — Progress Notes (Signed)
Patient refused CPAP for tonight. Patient has been refusing for a few days.

## 2019-10-16 NOTE — Evaluation (Signed)
Physical Therapy Evaluation Patient Details Name: Howard Bell MRN: [id number] DOB: 28-Jun-1965 Today's Date: 10/16/2019   History of Present Illness  54 y.o. male presenting with AMS and hypotension. PMHx significant for Bil TFAs 2020, DM2, CKD w/ESRD on HD MWF, neuropathy, retinopathy, OSA with chronic hypoxic respiratory failure, HTN, and depression.  Clinical Impression  Pt admitted with above diagnosis. Pt was able to transfer to the drop arm recliner with Modif I.  Pt states he is Modif I at wheelchair level at home.  Noted that pt at times on this admit can perform activity and at times is lethargic and unable. Will follow acutely to ensure progression of activity.  Pt currently with functional limitations due to the deficits listed below (see PT Problem List). Pt will benefit from skilled PT to increase their independence and safety with mobility to allow discharge to the venue listed below.      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   Transfers Overall transfer level: Needs assistance Equipment used: Sliding board (No slide board this date. ) Transfers: Lateral/Scoot Transfers          Lateral/Scoot Transfers: Modified independent (Device/Increase time);Supervision;From elevated surface General transfer comment: Pt was able to scoot to a drop arm recliner to his right without PT assist.  Good technique.    Ambulation/Gait             General Gait Details: Pt states  he does not ambulate.   Stairs            Wheelchair Mobility    Modified Rankin (Stroke Patients Only)       Balance Overall balance assessment: Modified Independent (Static and dynamic sitting with use of BUE. )                                           Pertinent Vitals/Pain Pain Assessment: No/denies pain    Home Living Family/patient expects to be discharged to:: Private residence Living Arrangements: Spouse/significant other Available Help at Discharge: Family;Available 24 hours/day Type of Home: Apartment Home Access: Level entry     Home Layout: One level Home Equipment: Walker - 2 wheels;Bedside commode;Shower seat;Wheelchair - manual;Hospital bed (sliding board)      Prior Function Level of Independence: Needs assistance   Gait / Transfers Assistance Needed: Mod I with lateral scoot and slide board transfers.  ADL's / Homemaking Assistance Needed: Patient reports requiring assistance with BADLs including bathing/dressing at bed level.         Hand Dominance   Dominant Hand: Right    Extremity/Trunk Assessment   Upper Extremity Assessment Upper Extremity Assessment: Defer to OT evaluation    Lower Extremity Assessment Lower Extremity Assessment: RLE deficits/detail;LLE deficits/detail RLE Deficits / Details: amputation above knee, hip WFL LLE Deficits / Details: amputation above knee, hip WFL     Cervical / Trunk Assessment Cervical / Trunk  Assessment: Normal  Communication   Communication: No difficulties  Cognition Arousal/Alertness: Awake/alert Behavior During Therapy: Flat affect Overall Cognitive Status: No family/caregiver present to determine baseline cognitive functioning                                        General Comments      Exercises     Assessment/Plan    PT Assessment Patient needs continued PT services  PT Problem List Decreased activity tolerance;Decreased balance;Decreased  mobility;Decreased knowledge of use of DME;Decreased safety awareness;Decreased knowledge of precautions;Obesity       PT Treatment Interventions DME instruction;Functional mobility training;Therapeutic activities;Therapeutic exercise;Balance training;Patient/family education    PT Goals (Current goals can be found in the Care Plan section)  Acute Rehab PT Goals Patient Stated Goal: No goals stated.  PT Goal Formulation: With patient Time For Goal Achievement: 10/30/19 Potential to Achieve Goals: Good    Frequency Min 3X/week   Barriers to discharge        Co-evaluation               AM-PAC PT "6 Clicks" Mobility  Outcome Measure Help needed turning from your back to your side while in a flat bed without using bedrails?: None Help needed moving from lying on your back to sitting on the side of a flat bed without using bedrails?: None Help needed moving to and from a bed to a chair (including a wheelchair)?: None Help needed standing up from a chair using your arms (e.g., wheelchair or bedside chair)?: Total Help needed to walk in hospital room?: Total Help needed climbing 3-5 steps with a railing? : Total 6 Click Score: 15    End of Session Equipment Utilized During Treatment: Gait belt Activity Tolerance: Patient tolerated treatment well Patient left: in chair;with call bell/phone within reach;with chair alarm set Nurse Communication: Mobility status PT Visit Diagnosis: Muscle weakness (generalized) (M62.81)    Time: 1914-7829 PT Time Calculation (min) (ACUTE ONLY): 15 min   Charges:   PT Evaluation $PT Eval Moderate Complexity: 1 Mod          [name],PT Acute Rehabilitation Services Pager:  [phone]  Office:  [phone]    [name] 10/16/2019, 11:25 AM

## 2019-10-16 NOTE — Evaluation (Signed)
Occupational Therapy Evaluation Patient Details Name: Howard Bell MRN: [id number] DOB: 06/20/1965 Today's Date: 10/16/2019    History of Present Illness 54 y.o. male presenting with AMS and hypotension. PMHx significant for Bil TFAs 2020, DM2, CKD w/ESRD on HD MWF, neuropathy, retinopathy, OSA with chronic hypoxic respiratory failure, HTN, and depression.   Clinical Impression   PTA, patient was living with his wife in a private residence and was requiring assistance for bed level BADLs including bathing, dressing, and toileting. Wife responsible for IADLs. Patient currently requiring set-up assist to Min A for UB BADLs, Mod A to Max A for LB BADLs at bed level and Mod I for lateral scoot transfers to drop-arm recliner. Patient also limited by lethargy this date requiring multimodal cues to keep eyes open and to maintain attention to task. Patient would benefit from continued acute OT services to maximize safety and independence with self-care tasks in prep for d/c to next level of care with recommendation for [place].     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 Not yet ready to return to supine.   Transfers Overall transfer level: Needs assistance Equipment used: Sliding board (No slide board this date. ) Transfers: Lateral/Scoot Transfers                Balance Overall balance assessment: Modified Independent (Static and dynamic sitting with use of BUE. )                                         ADL either performed  or assessed with clinical judgement   ADL Overall ADL's : Needs assistance/impaired     Grooming: Set up;Wash/dry hands;Wash/dry face;Oral care;Sitting           Upper Body Dressing : Set up;Sitting   Lower Body Dressing: Moderate assistance;Sitting/lateral leans;Bed level   Toilet Transfer: Maximal assistance Toilet Transfer Details (indicate cue type and reason): Bed level with use of bedpan            General ADL Comments: Patient very lethargic requiring Max vc's to keep eyes open and participate with OT eval this date.      Vision Baseline Vision/History: Wears glasses (Visual deficits at baselin) Wears Glasses: At all times Patient Visual Report: No change from baseline Vision Assessment?: Vision impaired- to be further tested in functional context     Perception     Praxis      Pertinent Vitals/Pain Pain Assessment: No/denies pain     Hand Dominance Right   Extremity/Trunk Assessment Upper Extremity Assessment Upper Extremity Assessment: Overall WFL for tasks assessed       Cervical / Trunk Assessment Cervical / Trunk Assessment: Normal   Communication Communication Communication: No difficulties   Cognition Arousal/Alertness: Lethargic Behavior During Therapy: Flat affect Overall Cognitive Status: No family/caregiver present to determine baseline cognitive functioning  General Comments       Exercises     Shoulder Instructions      Home Living Family/patient expects to be discharged to:: Private residence Living Arrangements: Spouse/significant other Available Help at Discharge: Family;Available 24 hours/day Type of Home: Apartment Home Access: Level entry     Home Layout: One level     Bathroom Shower/Tub: Teacher, early years/pre: Standard Bathroom Accessibility: No   Home Equipment: Environmental consultant - 2 wheels;Bedside commode;Shower seat;Wheelchair - manual;Hospital bed  (sliding board)          Prior Functioning/Environment Level of Independence: Needs assistance  Gait / Transfers Assistance Needed: Mod I with lateral scoot and slide board transfers. ADL's / Homemaking Assistance Needed: Patient reports requiring assistance with BADLs including bathing/dressing at bed level.             OT Problem List: Decreased strength;Decreased activity tolerance;Decreased cognition      OT Treatment/Interventions: Self-care/ADL training;Therapeutic exercise;DME and/or AE instruction;Therapeutic activities;Patient/family education    OT Goals(Current goals can be found in the care plan section) Acute Rehab OT Goals Patient Stated Goal: No goals stated.  OT Goal Formulation: Patient unable to participate in goal setting Time For Goal Achievement: 11/30/19 Potential to Achieve Goals: Good ADL Goals Additional ADL Goal #1: Patient will demonstrate bed mobility with Mod I in prep for BADLs and functional transfers. Additional ADL Goal #2: Patient will maintain eyes open and attention to task without verbal cues during grooming tasks in sitting. Additional ADL Goal #3: Patient will demonstrate GOOD dynamic sitting balance at EOB in prep for completion of bed level BADLs.  OT Frequency: Min 2X/week   Barriers to D/C:            Co-evaluation              AM-PAC OT "6 Clicks" Daily Activity     Outcome Measure Help from another person eating meals?: A Little Help from another person taking care of personal grooming?: A Little Help from another person toileting, which includes using toliet, bedpan, or urinal?: A Lot Help from another person bathing (including washing, rinsing, drying)?: A Lot Help from another person to put on and taking off regular upper body clothing?: A Little Help from another person to put on and taking off regular lower body clothing?: A Lot 6 Click Score: 15   End of Session    Activity Tolerance: Patient limited by  lethargy Patient left: in chair;with call bell/phone within reach;with chair alarm set  OT Visit Diagnosis: Muscle weakness (generalized) (M62.81)                Time: 0350-0938 OT Time Calculation (min): 15 min Charges:  OT General Charges $OT Visit: 1 Visit OT Evaluation $OT Eval Low Complexity: 1 Low  Dmya Long H. OTR/L Supplemental OT, Department of rehab services [phone]  Dannia Snook R H. 10/16/2019, 10:56 AM

## 2019-10-16 NOTE — Progress Notes (Signed)
PROGRESS NOTE    Howard Bell  MLY:[id number] DOB: 05/22/1965 DOA: 10/08/2019 PCP: Horald Pollen, MD   Brief Narrative:  54 year old male with past medical history of end-stage renal disease on hemodialysis, seizures, systolic heart failure, hypertension, type 2 diabetes mellitus, obesity and bilateral BKA. Patient was noted to have severe weakness, confusion and altered mentation after hemodialysis.  Apparently patient had been experiencing nausea and vomiting about 24 hours prior to hospitalization.  Initially patient was hypotensive with concerns of right upper lobe pneumonia.  He also received dialysis during hospitalization which improved his oxygenation.  Due to concerns of aspiration pneumonia, he has been on Unasyn and azithromycin.  Nephrology and pulmonary team following.  Oxygen has been slowly weaned down.   Assessment & Plan:   Principal Problem:   Community acquired pneumonia Active Problems:   Elevated troponin   ESRD on dialysis ([place])   S/P BKA (below knee amputation), right (HCC)   OSA (obstructive sleep apnea)   Seizures (HCC)   Hypotension   Metabolic encephalopathy   Acute metabolic encephalopathy   Type 2 diabetes mellitus with ESRD (end-stage renal disease) ([place])   Pneumonia   Sepsis secondary to right upper lobe pneumonia Acute on chronic hypoxia secondary to moderate-sized right effusion; 2 L oxygen at home Right-sided moderate pleural effusion Mild encephalopathy -Sepsis physiology improved. Now on 4-5L HF [place] -Speech recommends mild aspiration risk otherwise regular/thin liquid.  Must have aspirated when he was having nausea and vomiting. -Unasyn/azithromycin-last dose tomorrow early morning to complete 7-day course. -Continue Supplement Oxygen.  -As needed and twice daily bronchodilators, incentive spirometer and flutter valve -Seen by Pulm  -Ammonia normal, Reglan discontinued  Recurrent nausea and vomiting, improved -Abdominal x-ray  negative.  CT abdomen pelvis with p.o. contrast-negative for any acute did show right-sided effusion. -Endoscopy in September 2019-normal -PPI twice daily before meals -Reglan discontinued due to mild encephalopathy/drowsiness -We will need outpatient gastric emptying study  ESRD on hemodialysis -Nephrology team following -Sensipar  Essential hypertension -Resume home meds when appropriate  Diabetes mellitus type 2, uncontrolled due to hyperglycemia -A1c 10.7 -Labile blood glucose, discontinue long-acting Lantus.  We will manage it with sliding scale and Accu-Chek  History of seizure -P.o. Keppra  Hypothyroidism -Continue Synthroid  GERD PPI daily   DVT prophylaxis: Subcu heparin Code Status: Full code Family Communication: Spoke with [place].  Status is: Inpatient   Remains inpatient appropriate because:Hemodynamically unstable   Dispo: The patient is from: Home              Anticipated d/c is to: Home              Anticipated d/c date is: 2-3 days              Patient currently is not medically stable to d/c.  Still requires 5+ L nasal cannula.  Overall slow to improve from his pneumonitis/pneumonia from aspiration  Body mass index is 32.14 kg/m.    Subjective: Feels better, sitting in the chair this morning.   Review of Systems Otherwise negative except as per HPI, including: General = no fevers, chills, dizziness,  fatigue HEENT/EYES = negative for loss of vision, double vision, blurred vision,  sore throa Cardiovascular= negative for chest pain, palpitation Respiratory/lungs= negative for hempotysis  Gastrointestinal= negative for nausea, vomiting, abdominal pain Genitourinary= negative for Dysuria MSK = Negative for arthralgia, myalgias Neurology= Negative for headache, numbness, tingling  Psychiatry= Negative for suicidal and homocidal ideation Skin= Negative for Rash   Examination:  Constitutional: Not in acute distress Respiratory: Right basilar  crackle  Cardiovascular: Normal sinus rhythm, no rubs Abdomen: Nontender nondistended good bowel sounds Musculoskeletal: b/l BKA Skin: No rashes seen Neurologic: CN 2-12 grossly intact.  And nonfocal Psychiatric: Poor judgment and insight. Alert and oriented x 3. Normal mood.    Left AVG  Objective: Vitals:   10/16/19 0133 10/16/19 0346 10/16/19 0350 10/16/19 0727  BP: 114/78  121/74   Pulse:   100 97  Resp:   17 18  Temp:   98.3 F (36.8 C)   TempSrc:   Oral   SpO2: 93%  90% 93%  Weight:  (!) 107.5 kg    Height:        Intake/Output Summary (Last 24 hours) at 10/16/2019 0800 Last data filed at 10/16/2019 0400 Gross per 24 hour  Intake 942 ml  Output 4000 ml  Net -3058 ml   Filed Weights   10/15/19 1305 10/15/19 1719 10/16/19 0346  Weight: (!) 114.3 kg (!) 108 kg (!) 107.5 kg     Data Reviewed:   CBC: Recent Labs  Lab 10/10/19 1219 10/12/19 0718 10/15/19 0537  WBC 11.5* 8.3 6.3  NEUTROABS 9.0*  --   --   HGB 16.7 14.6 15.4  HCT 52.5* 45.8 47.4  MCV 98.3 97.2 98.1  PLT 146* 129* 633*   Basic Metabolic Panel: Recent Labs  Lab 10/10/19 1219 10/10/19 1219 10/11/19 0514 10/12/19 0716 10/13/19 0743 10/14/19 0633 10/15/19 0537  NA 132*   < > 133* 131* 133* 135 135  K 5.1   < > 4.4 3.9 4.3 4.5 4.0  CL 91*   < > 91* 92* 94* 97* 97*  CO2 23   < > 25 25 24 22 23   GLUCOSE 162*   < > 73 170* 144* 124* 214*  BUN 47*   < > 30* 43* 32* 40* 47*  CREATININE 11.14*   < > 8.65* 10.59* 9.08* 10.92* 12.88*  CALCIUM 10.4*   < > 9.6 9.6 9.4 9.2 9.1  MG  --   --  2.2 2.2 2.2 2.2 2.3  PHOS 7.0*  --   --  6.7*  --   --   --    < > = values in this interval not displayed.   GFR: Estimated Creatinine Clearance: 8.5 mL/min (A) (by C-G formula based on SCr of 12.88 mg/dL (H)). Liver Function Tests: Recent Labs  Lab 10/12/19 0716  ALBUMIN 3.0*   No results for input(s): LIPASE, AMYLASE in the last 168 hours. Recent Labs  Lab 10/14/19 0845  AMMONIA 28    Coagulation Profile: No results for input(s): INR, PROTIME in the last 168 hours. Cardiac Enzymes: No results for input(s): CKTOTAL, CKMB, CKMBINDEX, TROPONINI in the last 168 hours. BNP (last 3 results) No results for input(s): PROBNP in the last 8760 hours. HbA1C: No results for input(s): HGBA1C in the last 72 hours. CBG: Recent Labs  Lab 10/15/19 1827 10/15/19 1842 10/15/19 1854 10/15/19 2137 10/16/19 0547  GLUCAP 67* 69* 74 207* 143*   Lipid Profile: No results for input(s): CHOL, HDL, LDLCALC, TRIG, CHOLHDL, LDLDIRECT in the last 72 hours. Thyroid Function Tests: No results for input(s): TSH, T4TOTAL, FREET4, T3FREE, THYROIDAB in the last 72 hours. Anemia Panel: No results for input(s): VITAMINB12, FOLATE, FERRITIN, TIBC, IRON, RETICCTPCT in the last 72 hours. Sepsis Labs: Recent Labs  Lab 10/11/19 0514  PROCALCITON 1.26    Recent Results (from the past 240 hour(s))  Blood Culture (  routine x 2)     Status: None   Collection Time: 10/08/19  9:18 PM   Specimen: BLOOD RIGHT HAND  Result Value Ref Range Status   Specimen Description BLOOD RIGHT HAND  Final   Special Requests   Final    BOTTLES DRAWN AEROBIC AND ANAEROBIC Blood Culture results may not be optimal due to an inadequate volume of blood received in culture bottles   Culture   Final    NO GROWTH 5 DAYS Performed at [facility], [place] [address] [place], [place] [address]    Report Status 10/13/2019 FINAL  Final  Blood Culture (routine x 2)     Status: None   Collection Time: 10/08/19  9:25 PM   Specimen: BLOOD  Result Value Ref Range Status   Specimen Description BLOOD RIGHT UPPER ARM  Final   Special Requests   Final    BOTTLES DRAWN AEROBIC ONLY Blood Culture results may not be optimal due to an inadequate volume of blood received in culture bottles   Culture   Final    NO GROWTH 5 DAYS Performed at [facility], [place] [address] [place], [place] [address]    Report Status 10/13/2019  FINAL  Final  SARS Coronavirus 2 by RT PCR (hospital order, performed in [facility] hospital lab) Nasopharyngeal Nasopharyngeal Swab     Status: None   Collection Time: 10/08/19  9:58 PM   Specimen: Nasopharyngeal Swab  Result Value Ref Range Status   SARS Coronavirus 2 NEGATIVE NEGATIVE Final    Comment: (NOTE) SARS-CoV-2 target nucleic acids are NOT DETECTED.  The SARS-CoV-2 RNA is generally detectable in upper and lower respiratory specimens during the acute phase of infection. The lowest concentration of SARS-CoV-2 viral copies this assay can detect is 250 copies / mL. A negative result does not preclude SARS-CoV-2 infection and should not be used as the sole basis for treatment or other patient management decisions.  A negative result may occur with improper specimen collection / handling, submission of specimen other than nasopharyngeal swab, presence of viral mutation(s) within the areas targeted by this assay, and inadequate number of viral copies (<250 copies / mL). A negative result must be combined with clinical observations, patient history, and epidemiological information.  Fact Sheet for Patients:   StrictlyIdeas.no  Fact Sheet for Healthcare Providers: BankingDealers.co.za  This test is not yet approved or  cleared by the [place] FDA and has been authorized for detection and/or diagnosis of SARS-CoV-2 by FDA under an Emergency Use Authorization (EUA).  This EUA will remain in effect (meaning this test can be used) for the duration of the COVID-19 declaration under Section 564(b)(1) of the Act, 21 U.S.C. section 360bbb-3(b)(1), unless the authorization is terminated or revoked sooner.  Performed at [facility], [place] [address] [place], [place] [address]          Radiology Studies: No results found.      Scheduled Meds: . calcium acetate  2,668 mg Oral TID WC  . Chlorhexidine Gluconate Cloth   6 each Topical Q0600  . cinacalcet  90 mg Oral Q M,W,F-1800  . docusate sodium  100 mg Oral BID  . heparin  5,000 Units Subcutaneous Q8H  . insulin aspart  0-5 Units Subcutaneous QHS  . insulin aspart  0-9 Units Subcutaneous TID WC  . insulin glargine  5 Units Subcutaneous Daily  . ipratropium-albuterol  3 mL Nebulization BID  . levETIRAcetam  500 mg Oral  QHS  . levothyroxine  50 mcg Oral Daily  . multivitamin  1 tablet Oral QHS  . pantoprazole  40 mg Oral BID AC   Continuous Infusions: . sodium chloride 250 mL (10/12/19 1218)  . ampicillin-sulbactam (UNASYN) IV 1.5 g (10/16/19 0339)  . azithromycin 500 mg (10/15/19 0922)     LOS: 7 days   Time spent= 35 mins    Kenedie Dirocco Arsenio Loader, MD [facility]  If 7PM-7AM, please contact night-coverage  10/16/2019, 8:00 AM

## 2019-10-16 NOTE — Progress Notes (Signed)
Patient ID: Howard Bell, male   DOB: [birth year], 54 y.o.   MRN: [id number]  [facility] Progress Note   Assessment/ Plan:   1.  Metabolic encephalopathy/altered mental status: Suspected to be secondary to sepsis from right upper lobe pneumonia with concomitant acute hypoxic (and likely hypercapnic based on his habitus) respiratory failure.  On antimicrobial coverage with Unasyn/azithromycin as well as bronchodilators and incentive spirometer.  Pleural effusion not large enough for thoracentesis.  I attempted to discuss with him (unclear how much reception/understanding there was) the importance of adhering with CPAP. 2. ESRD: Usually on a Monday/Wednesday/Friday dialysis schedule and will undertake extra hemodialysis today to attempt additional volume unloading with ultrafiltration.  There appears to be significant discrepancy with his weight; his dry weight is listed as 88.5 kg and for unclear reason, appears that between 7/20 and 7/21, his weight jumped 14 kg from 91.7 to 105.2 (I suspect that this is the fallacy of using the bed scale). 3. Anemia: Hemoglobin and hematocrit within normal range, not on ESA. 4. CKD-MBD: Calcium level acceptable with elevated phosphorus, continue calcium acetate for phosphorus binding. 5. Nutrition: Continue renal diet/renal multivitamin 6. Hypertension: Blood pressure currently well controlled.  Subjective:   Increased lethargy while off of oxygen supplementation when seen yesterday by Dr. Valeta Harms with some improvement on oxygen supplementation.  Again refused CPAP.   Objective:   BP 121/74 (BP Location: Right Arm)   Pulse 100   Temp 98.3 F (36.8 C) (Oral)   Resp 17   Ht 6' (1.829 m)   Wt (!) 107.5 kg   SpO2 95%   BMI 32.14 kg/m   Physical Exam: Gen: Appears comfortable sitting up in bed.  Somnolent/lethargic; oriented to place, vaguely to time and person. CVS: Pulse regular rhythm, normal rate, S1 and S2 normal Resp: Clear to  auscultation bilaterally, no distinct rales or rhonchi Abd: Soft, obese, nontender Ext: Status post bilateral below-knee amputation, stump is intact.  Left upper arm AVG-pulsatile  Labs: BMET Recent Labs  Lab 10/10/19 1219 10/11/19 0514 10/12/19 0716 10/13/19 0743 10/14/19 0633 10/15/19 0537  NA 132* 133* 131* 133* 135 135  K 5.1 4.4 3.9 4.3 4.5 4.0  CL 91* 91* 92* 94* 97* 97*  CO2 23 25 25 24 22 23   GLUCOSE 162* 73 170* 144* 124* 214*  BUN 47* 30* 43* 32* 40* 47*  CREATININE 11.14* 8.65* 10.59* 9.08* 10.92* 12.88*  CALCIUM 10.4* 9.6 9.6 9.4 9.2 9.1  PHOS 7.0*  --  6.7*  --   --   --    CBC Recent Labs  Lab 10/10/19 1219 10/12/19 0718 10/15/19 0537  WBC 11.5* 8.3 6.3  NEUTROABS 9.0*  --   --   HGB 16.7 14.6 15.4  HCT 52.5* 45.8 47.4  MCV 98.3 97.2 98.1  PLT 146* 129* 122*     Medications:    . calcium acetate  2,668 mg Oral TID WC  . Chlorhexidine Gluconate Cloth  6 each Topical Q0600  . cinacalcet  90 mg Oral Q M,W,F-1800  . docusate sodium  100 mg Oral BID  . heparin  5,000 Units Subcutaneous Q8H  . insulin aspart  0-5 Units Subcutaneous QHS  . insulin aspart  0-9 Units Subcutaneous TID WC  . ipratropium-albuterol  3 mL Nebulization BID  . levETIRAcetam  500 mg Oral QHS  . levothyroxine  50 mcg Oral Daily  . multivitamin  1 tablet Oral QHS  . pantoprazole  40 mg Oral BID AC  Elmarie Shiley, MD 10/16/2019, 9:15 AM

## 2019-10-17 DIAGNOSIS — G9341 Metabolic encephalopathy: Secondary | ICD-10-CM | POA: Diagnosis not present

## 2019-10-17 DIAGNOSIS — J189 Pneumonia, unspecified organism: Secondary | ICD-10-CM | POA: Diagnosis not present

## 2019-10-17 DIAGNOSIS — I509 Heart failure, unspecified: Secondary | ICD-10-CM | POA: Diagnosis not present

## 2019-10-17 DIAGNOSIS — N186 End stage renal disease: Secondary | ICD-10-CM | POA: Diagnosis not present

## 2019-10-17 LAB — GLUCOSE, CAPILLARY
Glucose-Capillary: 180 mg/dL — ABNORMAL HIGH (ref 70–99)
Glucose-Capillary: 138 mg/dL — ABNORMAL HIGH (ref 70–99)
Glucose-Capillary: 184 mg/dL — ABNORMAL HIGH (ref 70–99)

## 2019-10-17 LAB — RENAL FUNCTION PANEL
Albumin: 3 g/dL — ABNORMAL LOW (ref 3.5–5.0)
Anion gap: 14 (ref 5–15)
BUN: 41 mg/dL — ABNORMAL HIGH (ref 6–20)
CO2: 24 mmol/L (ref 22–32)
Calcium: 9.5 mg/dL (ref 8.9–10.3)
Chloride: 94 mmol/L — ABNORMAL LOW (ref 98–111)
Creatinine, Ser: 11.78 mg/dL — ABNORMAL HIGH (ref 0.61–1.24)
GFR calc Af Amer: 5 mL/min — ABNORMAL LOW (ref >60)
GFR calc non Af Amer: 4 mL/min — ABNORMAL LOW (ref >60)
Glucose, Bld: 250 mg/dL — ABNORMAL HIGH (ref 70–99)
Phosphorus: 5.4 mg/dL — ABNORMAL HIGH (ref 2.5–4.6)
Potassium: 4.5 mmol/L (ref 3.5–5.1)
Sodium: 132 mmol/L — ABNORMAL LOW (ref 135–145)

## 2019-10-17 LAB — CBC
HCT: 45.6 % (ref 39.0–52.0)
Hemoglobin: 14.5 g/dL (ref 13.0–17.0)
MCH: 30.9 pg (ref 26.0–34.0)
MCHC: 31.8 g/dL (ref 30.0–36.0)
MCV: 97 fL (ref 80.0–100.0)
Platelets: 144 10*3/uL — ABNORMAL LOW (ref 150–400)
RBC: 4.7 MIL/uL (ref 4.22–5.81)
RDW: 17.3 % — ABNORMAL HIGH (ref 11.5–15.5)
WBC: 5.1 10*3/uL (ref 4.0–10.5)
nRBC: 0 % (ref 0.0–0.2)

## 2019-10-17 MED ORDER — INSULIN ASPART 100 UNIT/ML ~~LOC~~ SOLN
2.0000 [IU] | Freq: Three times a day (TID) | SUBCUTANEOUS | Status: DC
  Administered 2019-10-17: 2 [IU] via SUBCUTANEOUS

## 2019-10-17 MED ORDER — HEPARIN SODIUM (PORCINE) 1000 UNIT/ML IJ SOLN
INTRAMUSCULAR | Status: AC
  Administered 2019-10-17: 4300 [IU] via INTRAVENOUS_CENTRAL
  Filled 2019-10-17: qty 5

## 2019-10-17 MED ORDER — HEPARIN SODIUM (PORCINE) 1000 UNIT/ML DIALYSIS
40.0000 [IU]/kg | INTRAMUSCULAR | Status: DC | PRN
  Filled 2019-10-17 (×2): qty 5

## 2019-10-17 NOTE — TOC Transition Note (Signed)
Transition of Care [facility]) - CM/SW Discharge Note   Patient Details  Name: Howard Bell MRN: [id number] Date of Birth: 1965/11/26  [facility] CM/SW Contact:  Zenon Mayo, RN Phone Number: 10/17/2019, 2:56 PM   Clinical Narrative:    Patient is for dc today, Luellen Pucker with [facility] states they are in network with patient's insurance and will be able to do soc on Friday.  NCM informed wife of this information.     Final next level of care: [place] Barriers to Discharge: No Barriers Identified   Patient Goals and CMS Choice Patient states their goals for this hospitalization and ongoing recovery are:: get better CMS Medicare.gov Compare Post Acute Care list provided to:: Patient Choice offered to / list presented to : Patient, Spouse  Discharge Placement                       Discharge Plan and Services                  DME Agency: NA       HH Arranged: PT, OT [place] Agency: [place] Date [place]: 10/17/19 Time [place]: 0164 Representative spoke with at [place]: [place] Determinants of Health ([place]) Interventions     Readmission Risk Interventions No flowsheet data found.

## 2019-10-17 NOTE — TOC Transition Note (Addendum)
Transition of Care [facility]) - CM/SW Discharge Note   Patient Details  Name: Howard Bell MRN: [id number] Date of Birth: [birth year]  Transition of Care [facility]) CM/SW Contact:  Zenon Mayo, RN Phone Number: 10/17/2019, 2:33 PM   Clinical Narrative:    NCM contacted [facility] , they are in network with insurance but can not take referral,  [facility], [facility], [facility] , [facility] are not in [facility].  NCM asked wife if patient can do outpatient physical therapy and occupational therapy , she states yes. She can transport him there on the days he does not have diaylsis.  NCM made referral to outpatient physical therapy on [facility] NCM received call back from [facility] stating they are in network , she just received from her finance department.  They are able to do soc on Friday.  Will cancel the outpatient order for pt. NCM contacted outpatient rehab on [facility] [address] and they state they will cancel the referral.   Final next level of care: Home/Self Care Barriers to Discharge: No Barriers Identified   Patient Goals and CMS Choice Patient states their goals for this hospitalization and ongoing recovery are:: get better CMS Medicare.gov Compare Post Acute Care list provided to:: Patient Represenative (must comment) Choice offered to / list presented to : Spouse, Patient  Discharge Placement                       Discharge Plan and Services                  DME Agency: NA       HH Arranged: NA          Social Determinants of Health (SDOH) Interventions     Readmission Risk Interventions No flowsheet data found.

## 2019-10-17 NOTE — Discharge Summary (Signed)
Physician Discharge Summary  Howard Bell:[id number] DOB: [birth year] DOA: 10/08/2019  PCP: Horald Pollen, MD  Admit date: 10/08/2019 Discharge date: 10/17/2019  Admitted From: Home Disposition:  Home  Recommendations for Outpatient Follow-up:  1. Follow up with PCP in 1-2 weeks   Home Health:Yes Equipment/Devices:None  Discharge Condition:Stable CODE STATUS:Full Diet recommendation: Heart Healthy  Brief/Interim Summary: 54 y.o. male past medical history of end-stage renal disease on hemodialysis, history of seizure disorder, systolic heart failure essential hypertension diabetes mellitus type 2 obesity and bilateral BKA comes in for encephalopathy, nausea and vomiting that started 24 hours prior hospitalization.  In the ED he was found to be hypotensive with a new right lower lobe pneumonia, there is a concern about aspiration pneumonia so he was started on IV Unasyn and azithromycin.  Nephrology has been consulted.  Discharge Diagnoses:  Principal Problem:   Community acquired pneumonia Active Problems:   Elevated troponin   ESRD on dialysis ([place])   S/P BKA (below knee amputation), right (HCC)   OSA (obstructive sleep apnea)   Seizures (HCC)   Hypotension   Metabolic encephalopathy   Acute metabolic encephalopathy   Type 2 diabetes mellitus with ESRD (end-stage renal disease) ([place])   Pneumonia  Sepsis secondary to right lower lobe pneumonia/acute on chronic respiratory failure with hypoxia with moderate right-sided pleural effusion: He was started empirically on IV antibiotics, and gently resuscitated. Nephrology was consulted see below for further details. Speech evaluated the patient as there was concern of aspiration pneumonia, this was thought to be secondary to his episode of vomiting. He completed 7-day course of IV empiric antibiotics in house and he was discharged in stable condition.  Recurrent nausea and vomiting: With a CT scan of the abdomen and  pelvis with oral contrast showed no acute findings. He was originally started on Reglan which was discontinued he will continue Protonix twice a day. He will need a gastric emptying study as an outpatient.  End-stage renal disease on hemodialysis: he will continue his hemodialysis on Monday Wednesday and Fridays.  Essential hypertension: No changes made to his medication.  Diabetes mellitus type 2 uncontrolled with hyperglycemia: With an A1c of 10.7, no changes made to his home regimen.  History of seizures: Continue Keppra.   Discharge Instructions  Discharge Instructions    Diet - low sodium heart healthy   Complete by: As directed    Increase activity slowly   Complete by: As directed      Allergies as of 10/17/2019      Reactions   Gabapentin Other (See Comments)   Altered mental status      Medication List    TAKE these medications   accu-chek soft touch lancets Use as instructed   acetaminophen 325 MG tablet Commonly known as: TYLENOL Take 1-2 tablets (325-650 mg total) by mouth every 4 (four) hours as needed for mild pain.   albuterol 108 (90 Base) MCG/ACT inhaler Commonly known as: VENTOLIN HFA Inhale 1 puff into the lungs every 4 (four) hours as needed for wheezing or shortness of breath.   BiDil 20-37.5 MG tablet Generic drug: isosorbide-hydrALAZINE Take 1 tablet by mouth 3 (three) times daily.   carvedilol 6.25 MG tablet Commonly known as: COREG Take 1 tablet (6.25 mg total) by mouth 2 (two) times daily with a meal.   docusate sodium 100 MG capsule Commonly known as: COLACE Take 1 capsule (100 mg total) by mouth 2 (two) times daily.   ferric citrate 1 GM 210 MG(Fe)  tablet Commonly known as: AURYXIA Take 1 tablet (210 mg total) by mouth 3 (three) times daily with meals.   fluticasone 50 MCG/ACT nasal spray Commonly known as: FLONASE Place 1 spray into both nostrils daily as needed for allergies.   glucose blood test strip 1 each by Other  route as needed for other. Use as instructed   HumuLIN N KwikPen 100 UNIT/ML Kiwkpen Generic drug: Insulin NPH (Human) (Isophane) Inject 14 Units into the skin every morning. And pen needles 1/day What changed: how much to take   latanoprost 0.005 % ophthalmic solution Commonly known as: XALATAN Place 1 drop into both eyes at bedtime.   levETIRAcetam 500 MG tablet Commonly known as: KEPPRA Take 1 tablet (500 mg total) by mouth at bedtime.   levothyroxine 50 MCG tablet Commonly known as: SYNTHROID Take 1 tablet (50 mcg total) by mouth daily.   metoCLOPramide 5 MG tablet Commonly known as: Reglan Take 1 tablet (5 mg total) by mouth 3 (three) times daily before meals. What changed: when to take this   multivitamin Tabs tablet Take 1 tablet by mouth at bedtime.   omeprazole 20 MG capsule Commonly known as: PRILOSEC Take 1 capsule (20 mg total) by mouth at bedtime.   PhosLo 667 MG capsule Generic drug: calcium acetate Take 2,668 mg by mouth 3 (three) times daily with meals.   timolol 0.5 % ophthalmic solution Commonly known as: BETIMOL Place 1 drop into both eyes 2 (two) times daily.       Allergies  Allergen Reactions  . Gabapentin Other (See Comments)    Altered mental status    Consultations:Nephrology  Procedures/Studies: CT ABDOMEN PELVIS WO CONTRAST  Result Date: 10/10/2019 CLINICAL DATA:  Abdominal distension, nausea, vomiting. EXAM: CT ABDOMEN AND PELVIS WITHOUT CONTRAST TECHNIQUE: Multidetector CT imaging of the abdomen and pelvis was performed following the standard protocol without IV contrast. COMPARISON:  September 10, 2017. FINDINGS: Lower chest: Right lower lobe airspace opacity is noted consistent with pneumonia, with associated moderate right pleural effusion. Hepatobiliary: Probable minimal cholelithiasis is noted. No biliary dilatation is noted. The liver is unremarkable. Pancreas: Unremarkable. No pancreatic ductal dilatation or surrounding inflammatory  changes. Spleen: Normal in size without focal abnormality. Adrenals/Urinary Tract: Stable left adrenal adenoma. Right adrenal gland appears normal. Bilateral renal atrophy is noted consistent with history of renal failure. Stable bilateral renal cysts are noted. No hydronephrosis or renal obstruction is noted. Urinary bladder is unremarkable. Stomach/Bowel: Stomach is within normal limits. Appendix appears normal. No evidence of bowel wall thickening, distention, or inflammatory changes. Vascular/Lymphatic: Aortic atherosclerosis. No enlarged abdominal or pelvic lymph nodes. Extensive atherosclerotic calcification of all major arterial branches is noted as well. Reproductive: Prostate is unremarkable. Other: Small bilateral fat containing inguinal hernias are noted. Minimal free fluid is noted in the pelvis. 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 No acute abnormality is noted. IMPRESSION: 1. Right lower lobe airspace opacity is noted consistent with pneumonia, with associated moderate right pleural effusion. 2. Probable minimal cholelithiasis. 3. Stable left adrenal adenoma. 4. Bilateral renal atrophy is noted consistent with history of renal failure. Stable bilateral renal cysts are noted. 5. 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 6. Small bilateral fat containing inguinal hernias. Aortic Atherosclerosis (ICD10-I70.0). Electronically Signed   By: Marijo Conception M.D.   On: 10/10/2019 14:13   DG Chest 2 View  Result Date: 10/11/2019 CLINICAL DATA:  Follow-up pleural effusion hypotensive EXAM: CHEST - 2 VIEW  COMPARISON:  10/08/2019, CT chest 08/16/2019 FINDINGS: At least moderate right-sided pleural effusion which tracks along the right lateral chest, this is stable to slightly increased in size compared with most recent prior radiograph from 07/19. Airspace disease throughout the right lung. Consolidation left lung  base with air bronchograms as before. Cardiomegaly. No pneumothorax. Left subclavian region vascular stent. IMPRESSION: 1. At least moderate right-sided pleural effusion which tracks along the right lateral chest, stable to slightly increased in size compared with most recent prior radiograph. 2. Bilateral airspace disease, right greater than left, concerning for bilateral pneumonia 3. Cardiomegaly Electronically Signed   By: Donavan Foil M.D.   On: 10/11/2019 19:20   DG Abd 1 View  Result Date: 10/10/2019 CLINICAL DATA:  Abdominal distension. EXAM: ABDOMEN - 1 VIEW COMPARISON:  September 13, 2017. FINDINGS: The bowel gas pattern is normal. No radio-opaque calculi or other significant radiographic abnormality are seen. IMPRESSION: No evidence of bowel obstruction or ileus. Electronically Signed   By: Marijo Conception M.D.   On: 10/10/2019 10:25   CT HEAD WO CONTRAST  Result Date: 10/09/2019 CLINICAL DATA:  Subacute neurological deficits, confusion, history diabetes mellitus, hypertension, end-stage renal disease, stroke EXAM: CT HEAD WITHOUT CONTRAST TECHNIQUE: Contiguous axial images were obtained from the base of the skull through the vertex without intravenous contrast. Sagittal and coronal MPR images reconstructed from axial data set. COMPARISON:  08/16/2019 FINDINGS: Brain: Generalized atrophy. Normal ventricular morphology. No midline shift or mass effect. Small vessel chronic ischemic changes of deep cerebral white matter. Small old posterior RIGHT parietal infarct. 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 LEFT occipital and superior RIGHT cerebellar. Scattered dystrophic calcifications in basal ganglia and cerebellar hemispheres. No intracranial hemorrhage, mass lesion, or evidence of acute infarction. No extra-axial fluid collections. Vascular: Atherosclerotic calcification of internal carotid and vertebral arteries at skull base as well as multiple small vessel vascular calcifications within scalp. No hyperdense  vessels. Skull: Intact Sinuses/Orbits: Clear Other: N/A IMPRESSION: Atrophy with small vessel chronic ischemic changes of deep cerebral white matter. Multiple old infarcts as above. No acute intracranial abnormalities. Electronically Signed   By: Lavonia Dana M.D.   On: 10/09/2019 10:43   DG CHEST PORT 1 VIEW  Result Date: 10/12/2019 CLINICAL DATA:  Follow-up right pleural effusion EXAM: PORTABLE CHEST 1 VIEW COMPARISON:  10/11/2019 FINDINGS: Cardiac shadow is enlarged but stable. Left lung remains clear. Subclavian stenting is again noted and stable. Decrease in right-sided pleural effusion is noted possibly related to the upright position. Patchy opacity is again noted throughout the right lung. IMPRESSION: Patchy right-sided opacity. Decrease in right effusion which may be positional in nature. Electronically Signed   By: Inez Catalina M.D.   On: 10/12/2019 11:55   DG Chest Port 1 View  Result Date: 10/08/2019 CLINICAL DATA:  Shortness of breath. EXAM: PORTABLE CHEST 1 VIEW COMPARISON:  Aug 16, 2019 FINDINGS: Moderate severity right-sided perihilar infiltrate is seen. A small pleural effusion is also noted along the lateral aspect of the mid and lower right lung. No pneumothorax is seen. A radiopaque vascular stent is seen overlying the left apex. This is present on the prior study. The cardiac silhouette is moderately enlarged and unchanged in size. The visualized skeletal structures are unremarkable. IMPRESSION: 1. Moderate severity right-sided perihilar infiltrate. 2. Small pleural effusion along the lateral aspect of the mid and lower right lung. Electronically Signed   By: Virgina Norfolk M.D.   On: 10/08/2019 20:35   ECHOCARDIOGRAM LIMITED  Result Date: 10/09/2019  ECHOCARDIOGRAM LIMITED REPORT   Patient Name:   SHELDON SEM Date of Exam: 10/09/2019 Medical Rec #:  [id number]         Height:       72.0 in Accession #:    [id number]        Weight:       200.6 lb Date of Birth:  [birth year]          BSA:          2.133 m Patient Age:    5 years          BP:           111/83 mmHg Patient Gender: M                 HR:           98 bpm. Exam Location:  Inpatient Procedure: Limited Echo, Cardiac Doppler, Color Doppler and Intracardiac            Opacification Agent Indications:    Elevated troponin  History:        Patient has prior history of Echocardiogram examinations, most                 recent 08/17/2019. Risk Factors:Sleep Apnea, Hypertension and                 Diabetes. ESRD. H/o stroke.  Sonographer:    Clayton Lefort RDCS (AE) Referring Phys: [id number] [place] T TU  Sonographer Comments: Technically difficult study due to poor echo windows and patient is morbidly obese. Image acquisition challenging due to patient body habitus. Limited patient mobility. IMPRESSIONS  1. Severe concentric LVH up to 1.9 cm. Findings are possibly related to hypertensive heart disease. If there are clinical concerns for infiltrative cardiomyopathy, would consider cardiac MRI. Left ventricular ejection fraction, by estimation, is 20 to 25%. The left ventricle has severely decreased function. The left ventricle demonstrates global hypokinesis. There is severe concentric left ventricular hypertrophy. Indeterminate diastolic filling due to E-A fusion. There is the interventricular septum is flattened in systole and diastole, consistent with right ventricular pressure and volume overload.  2. Right ventricular systolic function is severely reduced. The right ventricular size is severely enlarged. There is mildly elevated pulmonary artery systolic pressure. The estimated right ventricular systolic pressure is 84.6 mmHg.  3. The mitral valve is degenerative. Trivial mitral valve regurgitation. No evidence of mitral stenosis.  4. Tricuspid valve regurgitation is mild to moderate.  5. The aortic valve is tricuspid. Aortic valve regurgitation is not visualized. Mild to moderate aortic valve sclerosis/calcification is present, without any  evidence of aortic stenosis.  6. The inferior vena cava is dilated in size with <50% respiratory variability, suggesting right atrial pressure of 15 mmHg. Comparison(s): No significant change from prior study. FINDINGS  Left Ventricle: Severe concentric LVH up to 1.9 cm. Findings are possibly related to hypertensive heart disease. If there are clinical concerns for infiltrative cardiomyopathy, would consider cardiac MRI. Left ventricular ejection fraction, by estimation, is 20 to 25%. The left ventricle has severely decreased function. The left ventricle demonstrates global hypokinesis. The left ventricular internal cavity size was normal in size. There is severe concentric left ventricular hypertrophy. The interventricular septum is flattened in systole and diastole, consistent with right ventricular pressure and volume overload. Right Ventricle: The right ventricular size is severely enlarged. No increase in right ventricular wall thickness. Right ventricular systolic function is severely reduced. There is mildly elevated pulmonary artery systolic pressure. The  tricuspid regurgitant velocity is 2.29 m/s, and with an assumed right atrial pressure of 15 mmHg, the estimated right ventricular systolic pressure is 75.9 mmHg. Pericardium: Trivial pericardial effusion is present. Mitral Valve: The mitral valve is degenerative in appearance. There is mild calcification of the anterior and posterior mitral valve leaflet(s). Mild mitral annular calcification. Trivial mitral valve regurgitation. No evidence of mitral valve stenosis. Tricuspid Valve: The tricuspid valve is grossly normal. Tricuspid valve regurgitation is mild to moderate. No evidence of tricuspid stenosis. Aortic Valve: The aortic valve is tricuspid. . There is moderate thickening and moderate calcification of the aortic valve. Aortic valve regurgitation is not visualized. Mild to moderate aortic valve sclerosis/calcification is present, without any evidence  of aortic stenosis. There is moderate thickening of the aortic valve. There is moderate calcification of the aortic valve. Aortic valve mean gradient measures 2.0 mmHg. Aortic valve peak gradient measures 2.4 mmHg. Aortic valve area, by VTI measures 3.30 cm. Pulmonic Valve: The pulmonic valve was grossly normal. Pulmonic valve regurgitation is not visualized. No evidence of pulmonic stenosis. Aorta: The aortic root and ascending aorta are structurally normal, with no evidence of dilitation. Venous: The inferior vena cava is dilated in size with less than 50% respiratory variability, suggesting right atrial pressure of 15 mmHg. LEFT VENTRICLE PLAX 2D LVIDd:         3.70 cm LVIDs:         2.90 cm LV PW:         1.87 cm LV IVS:        1.84 cm LVOT diam:     2.10 cm LV SV:         35 LV SV Index:   16 LVOT Area:     3.46 cm  IVC IVC diam: 2.20 cm LEFT ATRIUM         Index LA diam:    2.90 cm 1.36 cm/m  AORTIC VALVE AV Area (Vmax):    2.88 cm AV Area (Vmean):   2.08 cm AV Area (VTI):     3.30 cm AV Vmax:           77.60 cm/s AV Vmean:          60.800 cm/s AV VTI:            0.106 m AV Peak Grad:      2.4 mmHg AV Mean Grad:      2.0 mmHg LVOT Vmax:         64.60 cm/s LVOT Vmean:        36.600 cm/s LVOT VTI:          0.101 m LVOT/AV VTI ratio: 0.95  AORTA Ao Root diam: 3.20 cm Ao Asc diam:  3.60 cm TRICUSPID VALVE TR Peak grad:   21.0 mmHg TR Vmax:        229.00 cm/s  SHUNTS Systemic VTI:  0.10 m Systemic Diam: 2.10 cm Eleonore Chiquito MD Electronically signed by Eleonore Chiquito MD Signature Date/Time: 10/09/2019/2:05:42 PM    Final    DG ESOPHAGUS W SINGLE CM (SOL OR THIN BA)  Result Date: 10/10/2019 CLINICAL DATA:  54 year old male with history of gastroparesis. Vomiting. Food getting stuck in the esophagus. EXAM: ESOPHOGRAM/BARIUM SWALLOW TECHNIQUE: Single contrast examination was performed using thin barium. A barium tablet was also administered. FLUOROSCOPY TIME:  Fluoroscopy Time:  36 seconds Radiation Exposure  Index (if provided by the fluoroscopic device): 7.0 mGy Number of Acquired Spot Images: 0 COMPARISON:  None. FINDINGS: Because of  limited patient mobility, single contrast examination was performed. This demonstrated failure to fully propagate the majority of primary peristaltic waves. Mild tertiary contractions were observed. Esophagus appeared patulous, but was otherwise structurally normal. Specifically, no esophageal mass, stricture, esophageal ring or hiatal hernia was noted. A barium tablet was administered which passed readily into the stomach. IMPRESSION: 1. 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 2. Patulous esophagus. Electronically Signed   By: Vinnie Langton M.D.   On: 10/10/2019 16:44    Subjective: No complaing  Discharge Exam: Vitals:   10/16/19 1929 10/17/19 0555  BP: (!) 169/97 125/83  Pulse: 94 95  Resp: 18 16  Temp: 97.6 F (36.4 C) (!) 97.5 F (36.4 C)  SpO2: 96% 94%   Vitals:   10/16/19 1251 10/16/19 1929 10/17/19 0551 10/17/19 0555  BP: (!) 133/87 (!) 169/97  125/83  Pulse: 96 94  95  Resp: 16 18  16   Temp: 98.8 F (37.1 C) 97.6 F (36.4 C)  (!) 97.5 F (36.4 C)  TempSrc: Oral Oral  Oral  SpO2: 94% 96%  94%  Weight:   (!) 107 kg   Height:        General: Pt is alert, awake, not in acute distress Cardiovascular: RRR, S1/S2 +, no rubs, no gallops Respiratory: CTA bilaterally, no wheezing, no rhonchi Abdominal: Soft, NT, ND, bowel sounds + Extremities: no edema, no cyanosis    The results of significant diagnostics from this hospitalization (including imaging, microbiology, ancillary and laboratory) are listed below for reference.     Microbiology: Recent Results (from the past 240 hour(s))  Blood Culture (routine x 2)     Status: None   Collection Time: 10/08/19  9:18 PM   Specimen: BLOOD RIGHT HAND  Result Value Ref Range Status   Specimen Description BLOOD RIGHT HAND  Final   Special Requests   Final     BOTTLES DRAWN AEROBIC AND ANAEROBIC Blood Culture results may not be optimal due to an inadequate volume of blood received in culture bottles   Culture   Final    NO GROWTH 5 DAYS Performed at [facility], [place] [address] [place], [place] [address]    Report Status 10/13/2019 FINAL  Final  Blood Culture (routine x 2)     Status: None   Collection Time: 10/08/19  9:25 PM   Specimen: BLOOD  Result Value Ref Range Status   Specimen Description BLOOD RIGHT UPPER ARM  Final   Special Requests   Final    BOTTLES DRAWN AEROBIC ONLY Blood Culture results may not be optimal due to an inadequate volume of blood received in culture bottles   Culture   Final    NO GROWTH 5 DAYS Performed at [facility], [place] [address], [place], [place] [address]    Report Status 10/13/2019 FINAL  Final  SARS Coronavirus 2 by RT PCR (hospital order, performed in [facility] hospital lab) Nasopharyngeal Nasopharyngeal Swab     Status: None   Collection Time: 10/08/19  9:58 PM   Specimen: Nasopharyngeal Swab  Result Value Ref Range Status   SARS Coronavirus 2 NEGATIVE NEGATIVE Final    Comment: (NOTE) SARS-CoV-2 target nucleic acids are NOT DETECTED.  The SARS-CoV-2 RNA is generally detectable in upper and lower respiratory specimens during the acute phase of infection. The lowest concentration of SARS-CoV-2 viral copies this assay can detect is 250 copies / mL. A negative result does not preclude SARS-CoV-2 infection and should  not be used as the sole basis for treatment or other patient management decisions.  A negative result may occur with improper specimen collection / handling, submission of specimen other than nasopharyngeal swab, presence of viral mutation(s) within the areas targeted by this assay, and inadequate number of viral copies (<250 copies / mL). A negative result must be combined with clinical observations, patient history, and epidemiological information.  Fact Sheet for  Patients:   StrictlyIdeas.no  Fact Sheet for Healthcare Providers: BankingDealers.co.za  This test is not yet approved or  cleared by the [place] FDA and has been authorized for detection and/or diagnosis of SARS-CoV-2 by FDA under an Emergency Use Authorization (EUA).  This EUA will remain in effect (meaning this test can be used) for the duration of the COVID-19 declaration under Section 564(b)(1) of the Act, 21 U.S.C. section 360bbb-3(b)(1), unless the authorization is terminated or revoked sooner.  Performed at [facility], [place] [address] [place], [place] [address]      Labs: BNP (last 3 results) Recent Labs    08/16/19 1855 10/08/19 2001  BNP 1,239.1* 7,622.6*   Basic Metabolic Panel: Recent Labs  Lab 10/10/19 1219 10/10/19 1219 10/11/19 0514 10/12/19 0716 10/13/19 0743 10/14/19 0633 10/15/19 0537  NA 132*   < > 133* 131* 133* 135 135  K 5.1   < > 4.4 3.9 4.3 4.5 4.0  CL 91*   < > 91* 92* 94* 97* 97*  CO2 23   < > 25 25 24 22 23   GLUCOSE 162*   < > 73 170* 144* 124* 214*  BUN 47*   < > 30* 43* 32* 40* 47*  CREATININE 11.14*   < > 8.65* 10.59* 9.08* 10.92* 12.88*  CALCIUM 10.4*   < > 9.6 9.6 9.4 9.2 9.1  MG  --   --  2.2 2.2 2.2 2.2 2.3  PHOS 7.0*  --   --  6.7*  --   --   --    < > = values in this interval not displayed.   Liver Function Tests: Recent Labs  Lab 10/12/19 0716  ALBUMIN 3.0*   No results for input(s): LIPASE, AMYLASE in the last 168 hours. Recent Labs  Lab 10/14/19 0845  AMMONIA 28   CBC: Recent Labs  Lab 10/10/19 1219 10/12/19 0718 10/15/19 0537  WBC 11.5* 8.3 6.3  NEUTROABS 9.0*  --   --   HGB 16.7 14.6 15.4  HCT 52.5* 45.8 47.4  MCV 98.3 97.2 98.1  PLT 146* 129* 122*   Cardiac Enzymes: No results for input(s): CKTOTAL, CKMB, CKMBINDEX, TROPONINI in the last 168 hours. BNP: Invalid input(s): POCBNP CBG: Recent Labs  Lab 10/16/19 0547 10/16/19 1122  10/16/19 1708 10/16/19 2135 10/17/19 0548  GLUCAP 143* 163* 169* 208* 180*   D-Dimer No results for input(s): DDIMER in the last 72 hours. Hgb A1c No results for input(s): HGBA1C in the last 72 hours. Lipid Profile No results for input(s): CHOL, HDL, LDLCALC, TRIG, CHOLHDL, LDLDIRECT in the last 72 hours. Thyroid function studies No results for input(s): TSH, T4TOTAL, T3FREE, THYROIDAB in the last 72 hours.  Invalid input(s): FREET3 Anemia work up No results for input(s): VITAMINB12, FOLATE, FERRITIN, TIBC, IRON, RETICCTPCT in the last 72 hours. Urinalysis    Component Value Date/Time   COLORURINE RED (A) 11/19/2016 0020   APPEARANCEUR TURBID (A) 11/19/2016 0020   LABSPEC  11/19/2016 0020    TEST NOT REPORTED DUE TO COLOR INTERFERENCE OF URINE  PIGMENT   PHURINE  11/19/2016 0020    TEST NOT REPORTED DUE TO COLOR INTERFERENCE OF URINE PIGMENT   GLUCOSEU (A) 11/19/2016 0020    TEST NOT REPORTED DUE TO COLOR INTERFERENCE OF URINE PIGMENT   HGBUR (A) 11/19/2016 0020    TEST NOT REPORTED DUE TO COLOR INTERFERENCE OF URINE PIGMENT   BILIRUBINUR (A) 11/19/2016 0020    TEST NOT REPORTED DUE TO COLOR INTERFERENCE OF URINE PIGMENT   KETONESUR (A) 11/19/2016 0020    TEST NOT REPORTED DUE TO COLOR INTERFERENCE OF URINE PIGMENT   PROTEINUR (A) 11/19/2016 0020    TEST NOT REPORTED DUE TO COLOR INTERFERENCE OF URINE PIGMENT   NITRITE (A) 11/19/2016 0020    TEST NOT REPORTED DUE TO COLOR INTERFERENCE OF URINE PIGMENT   LEUKOCYTESUR (A) 11/19/2016 0020    TEST NOT REPORTED DUE TO COLOR INTERFERENCE OF URINE PIGMENT   Sepsis Labs Invalid input(s): PROCALCITONIN,  WBC,  LACTICIDVEN Microbiology Recent Results (from the past 240 hour(s))  Blood Culture (routine x 2)     Status: None   Collection Time: 10/08/19  9:18 PM   Specimen: BLOOD RIGHT HAND  Result Value Ref Range Status   Specimen Description BLOOD RIGHT HAND  Final   Special Requests   Final    BOTTLES DRAWN AEROBIC AND  ANAEROBIC Blood Culture results may not be optimal due to an inadequate volume of blood received in culture bottles   Culture   Final    NO GROWTH 5 DAYS Performed at [facility], [place] [address], [place], [place] [address]    Report Status 10/13/2019 FINAL  Final  Blood Culture (routine x 2)     Status: None   Collection Time: 10/08/19  9:25 PM   Specimen: BLOOD  Result Value Ref Range Status   Specimen Description BLOOD RIGHT UPPER ARM  Final   Special Requests   Final    BOTTLES DRAWN AEROBIC ONLY Blood Culture results may not be optimal due to an inadequate volume of blood received in culture bottles   Culture   Final    NO GROWTH 5 DAYS Performed at [facility], [place] [address] [place],  [address]    Report Status 10/13/2019 FINAL  Final  SARS Coronavirus 2 by RT PCR (hospital order, performed in [facility] hospital lab) Nasopharyngeal Nasopharyngeal Swab     Status: None   Collection Time: 10/08/19  9:58 PM   Specimen: Nasopharyngeal Swab  Result Value Ref Range Status   SARS Coronavirus 2 NEGATIVE NEGATIVE Final    Comment: (NOTE) SARS-CoV-2 target nucleic acids are NOT DETECTED.  The SARS-CoV-2 RNA is generally detectable in upper and lower respiratory specimens during the acute phase of infection. The lowest concentration of SARS-CoV-2 viral copies this assay can detect is 250 copies / mL. A negative result does not preclude SARS-CoV-2 infection and should not be used as the sole basis for treatment or other patient management decisions.  A negative result may occur with improper specimen collection / handling, submission of specimen other than nasopharyngeal swab, presence of viral mutation(s) within the areas targeted by this assay, and inadequate number of viral copies (<250 copies / mL). A negative result must be combined with clinical observations, patient history, and epidemiological information.  Fact Sheet for Patients:    StrictlyIdeas.no  Fact Sheet for Healthcare Providers: BankingDealers.co.za  This test is not yet approved or  cleared by the [place] FDA and has been authorized  for detection and/or diagnosis of SARS-CoV-2 by FDA under an Emergency Use Authorization (EUA).  This EUA will remain in effect (meaning this test can be used) for the duration of the COVID-19 declaration under Section 564(b)(1) of the Act, 21 U.S.C. section 360bbb-3(b)(1), unless the authorization is terminated or revoked sooner.  Performed at [facility], [place] [address] [place], [place] [address]      Time coordinating discharge: Over 30 minutes  SIGNED:   Charlynne Cousins, MD  [facility] 10/17/2019, 10:07 AM Pager   If 7PM-7AM, please contact night-coverage [email or web address] Password [email or web address]

## 2019-10-17 NOTE — Procedures (Signed)
I was present at this dialysis session. I have reviewed the session itself and made appropriate changes.   Tolerating dialysis, open to be discharged after HD today. lue avf, qbf 350, uf goal 3500. bp 180/110, hr 93.  Filed Weights   10/15/19 1719 10/16/19 0346 10/17/19 0551  Weight: (!) 108 kg (!) 107.5 kg (!) 107 kg    Recent Labs  Lab 10/12/19 0716 10/13/19 0743 10/15/19 0537  NA 131*   < > 135  K 3.9   < > 4.0  CL 92*   < > 97*  CO2 25   < > 23  GLUCOSE 170*   < > 214*  BUN 43*   < > 47*  CREATININE 10.59*   < > 12.88*  CALCIUM 9.6   < > 9.1  PHOS 6.7*  --   --    < > = values in this interval not displayed.    Recent Labs  Lab 10/12/19 0718 10/15/19 0537 10/17/19 1424  WBC 8.3 6.3 5.1  HGB 14.6 15.4 14.5  HCT 45.8 47.4 45.6  MCV 97.2 98.1 97.0  PLT 129* 122* 144*    Scheduled Meds: . calcium acetate  2,668 mg Oral TID WC  . Chlorhexidine Gluconate Cloth  6 each Topical Q0600  . cinacalcet  90 mg Oral Q M,W,F-1800  . docusate sodium  100 mg Oral BID  . heparin  5,000 Units Subcutaneous Q8H  . insulin aspart  0-5 Units Subcutaneous QHS  . insulin aspart  0-9 Units Subcutaneous TID WC  . insulin aspart  2 Units Subcutaneous TID WC  . levETIRAcetam  500 mg Oral QHS  . levothyroxine  50 mcg Oral Daily  . multivitamin  1 tablet Oral QHS  . pantoprazole  40 mg Oral BID AC   Continuous Infusions: . sodium chloride 250 mL (10/12/19 1218)   PRN Meds:.sodium chloride, acetaminophen, albuterol, heparin, loperamide, ondansetron (ZOFRAN) IV, polyethylene glycol, senna-docusate   Gean Quint, MD [facility] 10/17/2019, 3:26 PM

## 2019-10-17 NOTE — Progress Notes (Signed)
Patient ID: Howard Bell, male   DOB: 07-28-1965, 54 y.o.   MRN: [id number]  [facility] Progress Note   Assessment/ Plan:   1.  Metabolic encephalopathy/altered mental status: Suspected to be secondary to sepsis from right upper lobe pneumonia with hypoxic/hypercapnic respiratory failure. Sepsis markers improving with antibiotic coverage along with incentive spirometry/bronchodilators. Seen by pulmonology with regards to pleural effusion-not large enough to undertake thoracentesis. I discussed the need for adherence  with CPAP. 2. ESRD: Usually on a Monday/Wednesday/Friday dialysis schedule and will undergo hemodialysis today-unable to get extra treatment yesterday due to patient volume.  There appears to be significant discrepancy with his weight; his dry weight is listed as 88.5 kg and for unclear reason, appears that between 7/20 and 7/21, his weight jumped 14 kg from 91.7 to 105.2 (I suspect that this is the fallacy of using the bed scale). 3. Anemia: Hemoglobin and hematocrit within normal range, not on ESA. 4. CKD-MBD: Calcium level acceptable with elevated phosphorus, continue calcium acetate for phosphorus binding. 5. Nutrition: Continue renal diet/renal multivitamin 6. Hypertension: Blood pressure currently well controlled.  Subjective:   Refused CPAP again overnight. Appears a little more awake on conversation today and denies any complaints.   Objective:   BP 125/83 (BP Location: Right Arm)   Pulse 95   Temp (!) 97.5 F (36.4 C) (Oral)   Resp 16   Ht 6' (1.829 m)   Wt (!) 107 kg   SpO2 94%   BMI 32.01 kg/m   Physical Exam: Gen: He appears to be more awake/alert compared to yesterday and is resting in bed. 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  Left upper arm  AVG-pulsatile  Labs: BMET Recent Labs  Lab 10/10/19 1219 10/11/19 0514 10/12/19 0716 10/13/19 0743 10/14/19 0633 10/15/19 0537  NA 132* 133* 131* 133* 135 135  K 5.1 4.4 3.9 4.3 4.5 4.0  CL 91* 91* 92* 94* 97* 97*  CO2 23 25 25 24 22 23   GLUCOSE 162* 73 170* 144* 124* 214*  BUN 47* 30* 43* 32* 40* 47*  CREATININE 11.14* 8.65* 10.59* 9.08* 10.92* 12.88*  CALCIUM 10.4* 9.6 9.6 9.4 9.2 9.1  PHOS 7.0*  --  6.7*  --   --   --    CBC Recent Labs  Lab 10/10/19 1219 10/12/19 0718 10/15/19 0537  WBC 11.5* 8.3 6.3  NEUTROABS 9.0*  --   --   HGB 16.7 14.6 15.4  HCT 52.5* 45.8 47.4  MCV 98.3 97.2 98.1  PLT 146* 129* 122*     Medications:    . calcium acetate  2,668 mg Oral TID WC  . Chlorhexidine Gluconate Cloth  6 each Topical Q0600  . cinacalcet  90 mg Oral Q M,W,F-1800  . docusate sodium  100 mg Oral BID  . heparin  5,000 Units Subcutaneous Q8H  . insulin aspart  0-5 Units Subcutaneous QHS  . insulin aspart  0-9 Units Subcutaneous TID WC  . levETIRAcetam  500 mg Oral QHS  . levothyroxine  50 mcg Oral Daily  . multivitamin  1 tablet Oral QHS  . pantoprazole  40 mg Oral BID AC   Elmarie Shiley, MD 10/17/2019, 8:29 AM

## 2019-10-18 ENCOUNTER — Telehealth: Payer: Self-pay | Admitting: Nephrology

## 2019-10-18 NOTE — Telephone Encounter (Signed)
Transition of Care Contact from [facility]  Date of Discharge: 10/17/19 Date of Contact: 10/18/19 Method of contact: phone  Attempted to contact patient to discuss transition of care from inpatient admission. Patient did not answer the phone.  Message was left on patient's voicemail informing them we would attempt to call them again and if unable to reach will follow up at dialysis.  Jen Mow, PA-C [facility] Pager: [phone]

## 2019-10-19 ENCOUNTER — Telehealth (HOSPITAL_COMMUNITY): Payer: Self-pay | Admitting: Nephrology

## 2019-10-19 NOTE — Telephone Encounter (Signed)
Transition of care contact from inpatient facility  Date of Discharge: 10/17/2019 Date of Contact: 7/30 -- attempted Method of contact: Phone  Attempted to contact patient to discuss transition of care from inpatient admission. Phone call went directly to voicemail message saying the voicemail inbox is full. There was no ability to leave a message. Will follow up with him during dialysis next week.   Veneta Penton, PA-C [employer] Pager [phone]

## 2019-10-23 ENCOUNTER — Encounter (HOSPITAL_COMMUNITY): Payer: Self-pay | Admitting: Emergency Medicine

## 2019-10-23 ENCOUNTER — Telehealth: Payer: Self-pay | Admitting: *Deleted

## 2019-10-23 ENCOUNTER — Emergency Department (HOSPITAL_COMMUNITY)
Admission: EM | Admit: 2019-10-23 | Discharge: 2019-10-24 | Disposition: A | Discharge status: Home / Self Care | Payer: Medicare Other | Attending: Emergency Medicine | Admitting: Emergency Medicine

## 2019-10-23 ENCOUNTER — Other Ambulatory Visit: Payer: Self-pay

## 2019-10-23 ENCOUNTER — Telehealth (INDEPENDENT_AMBULATORY_CARE_PROVIDER_SITE_OTHER): Payer: Medicare Other | Admitting: Emergency Medicine

## 2019-10-23 ENCOUNTER — Encounter: Payer: Self-pay | Admitting: Emergency Medicine

## 2019-10-23 DIAGNOSIS — Z89611 Acquired absence of right leg above knee: Secondary | ICD-10-CM

## 2019-10-23 DIAGNOSIS — N185 Chronic kidney disease, stage 5: Secondary | ICD-10-CM

## 2019-10-23 DIAGNOSIS — I132 Hypertensive heart and chronic kidney disease with heart failure and with stage 5 chronic kidney disease, or end stage renal disease: Secondary | ICD-10-CM

## 2019-10-23 DIAGNOSIS — E1122 Type 2 diabetes mellitus with diabetic chronic kidney disease: Secondary | ICD-10-CM | POA: Diagnosis not present

## 2019-10-23 DIAGNOSIS — Z8619 Personal history of other infectious and parasitic diseases: Secondary | ICD-10-CM | POA: Diagnosis not present

## 2019-10-23 DIAGNOSIS — E669 Obesity, unspecified: Secondary | ICD-10-CM | POA: Diagnosis not present

## 2019-10-23 DIAGNOSIS — I5023 Acute on chronic systolic (congestive) heart failure: Secondary | ICD-10-CM | POA: Insufficient documentation

## 2019-10-23 DIAGNOSIS — Z89612 Acquired absence of left leg above knee: Secondary | ICD-10-CM

## 2019-10-23 DIAGNOSIS — Z20822 Contact with and (suspected) exposure to covid-19: Secondary | ICD-10-CM | POA: Insufficient documentation

## 2019-10-23 DIAGNOSIS — Z992 Dependence on renal dialysis: Secondary | ICD-10-CM

## 2019-10-23 DIAGNOSIS — R197 Diarrhea, unspecified: Secondary | ICD-10-CM

## 2019-10-23 DIAGNOSIS — N186 End stage renal disease: Secondary | ICD-10-CM

## 2019-10-23 DIAGNOSIS — I739 Peripheral vascular disease, unspecified: Secondary | ICD-10-CM

## 2019-10-23 DIAGNOSIS — E118 Type 2 diabetes mellitus with unspecified complications: Secondary | ICD-10-CM | POA: Diagnosis not present

## 2019-10-23 LAB — CBC WITH DIFFERENTIAL/PLATELET
Abs Immature Granulocytes: 0.01 10*3/uL (ref 0.00–0.07)
Basophils Absolute: 0.1 10*3/uL (ref 0.0–0.1)
Basophils Relative: 2 %
Eosinophils Absolute: 0.2 10*3/uL (ref 0.0–0.5)
Eosinophils Relative: 4 %
HCT: 49 % (ref 39.0–52.0)
Hemoglobin: 15.7 g/dL (ref 13.0–17.0)
Immature Granulocytes: 0 %
Lymphocytes Relative: 29 %
Lymphs Abs: 1.6 10*3/uL (ref 0.7–4.0)
MCH: 30.8 pg (ref 26.0–34.0)
MCHC: 32 g/dL (ref 30.0–36.0)
MCV: 96.1 fL (ref 80.0–100.0)
Monocytes Absolute: 0.6 10*3/uL (ref 0.1–1.0)
Monocytes Relative: 11 %
Neutro Abs: 3.2 10*3/uL (ref 1.7–7.7)
Neutrophils Relative %: 54 %
Platelets: 171 10*3/uL (ref 150–400)
RBC: 5.1 MIL/uL (ref 4.22–5.81)
RDW: 18.1 % — ABNORMAL HIGH (ref 11.5–15.5)
WBC: 5.8 10*3/uL (ref 4.0–10.5)
nRBC: 0 % (ref 0.0–0.2)

## 2019-10-23 LAB — COMPREHENSIVE METABOLIC PANEL
ALT: 34 U/L (ref 0–44)
AST: 24 U/L (ref 15–41)
Albumin: 3.4 g/dL — ABNORMAL LOW (ref 3.5–5.0)
Alkaline Phosphatase: 104 U/L (ref 38–126)
Anion gap: 14 (ref 5–15)
BUN: 39 mg/dL — ABNORMAL HIGH (ref 6–20)
CO2: 30 mmol/L (ref 22–32)
Calcium: 9.6 mg/dL (ref 8.9–10.3)
Chloride: 93 mmol/L — ABNORMAL LOW (ref 98–111)
Creatinine, Ser: 10.61 mg/dL — ABNORMAL HIGH (ref 0.61–1.24)
GFR calc Af Amer: 6 mL/min — ABNORMAL LOW (ref >60)
GFR calc non Af Amer: 5 mL/min — ABNORMAL LOW (ref >60)
Glucose, Bld: 167 mg/dL — ABNORMAL HIGH (ref 70–99)
Potassium: 3.7 mmol/L (ref 3.5–5.1)
Sodium: 137 mmol/L (ref 135–145)
Total Bilirubin: 1 mg/dL (ref 0.3–1.2)
Total Protein: 7.5 g/dL (ref 6.5–8.1)

## 2019-10-23 NOTE — Telephone Encounter (Signed)
Called patient to triage for 5:00 pm appointment automated voice mail states mail box is full. I was unable to leave a message to call back.

## 2019-10-23 NOTE — Telephone Encounter (Signed)
I spoke to Dr Mitchel Honour and he connected with patient by video at 5:08 pm. PATIENT NOT A NO SHOW.

## 2019-10-23 NOTE — Progress Notes (Addendum)
Telemedicine Encounter- SOAP NOTE Established Patient MyChart video conference This video telephone encounter was conducted with the patient's (or proxy's) verbal consent via video audio telecommunications: yes/no: Yes Patient was instructed to have this encounter in a suitably private space; and to only have persons present to whom they give permission to participate. In addition, patient identity was confirmed by use of name plus two identifiers (DOB and address).  I discussed the limitations, risks, security and privacy concerns of performing an evaluation and management service by telephone and the availability of in person appointments. I also discussed with the patient that there may be a patient responsible charge related to this service. The patient expressed understanding and agreed to proceed.  I spent a total of TIME; 0 MIN TO 60 MIN: 20 minutes talking with the patient or their proxy.  No chief complaint on file.   Subjective   Howard Bell is a 54 y.o. male established patient with multiple chronic medical problems.  Virtual visit today complaining of frequent watery nonbloody diarrhea that started while in the hospital about 10 days ago.  Denies nausea or vomiting.  Denies fever or chills.  Was in the hospital with lung infection that required antibiotics.  Has history of C. difficile infection in the past.  No other significant symptoms. Patient: Home  Provider: Office     HPI   Patient Active Problem List   Diagnosis Date Noted  . Metabolic encephalopathy 65/99/3570  . Community acquired pneumonia 10/09/2019  . Acute metabolic encephalopathy 17/79/3903  . Type 2 diabetes mellitus with ESRD (end-stage renal disease) ([place]) 10/09/2019  . Pneumonia 10/09/2019  . Hypotension 10/08/2019  . H/O stroke without residual deficits   . Acute on chronic combined systolic and diastolic CHF (congestive heart failure) ([place]) 08/18/2019  . Seizures ([place]) 08/16/2019  .  Abnormality of gait 01/30/2019  . S/P AKA (above knee amputation) bilateral ([place]) 11/07/2018  . Chronic hypercapnic respiratory failure ([place]) 10/02/2018  . OSA (obstructive sleep apnea)   . S/P AKA (above knee amputation) unilateral, right ([place])   . Diabetic peripheral neuropathy ([place])   . Status post bilateral above knee amputation ([place]) 09/08/2018  . Atherosclerosis of native arteries of extremities with gangrene, left leg ([place]) 09/05/2018  . Peripheral arterial disease ([place])   . Unilateral AKA, right ([place]) 08/17/2018  . Drug induced constipation   . Uncontrolled diabetes mellitus type 2 with peripheral artery disease ([place])   . Anemia of chronic disease   . Diabetes mellitus type 2 in nonobese (HCC)   . Supplemental oxygen dependent   . Unilateral AKA, left ([place]) 08/07/2018  . S/P BKA (below knee amputation), right ([place]) 08/04/2018  . Critical limb ischemia with history of revascularization of same extremity 05/09/2018  . 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 ([place]) 11/16/2017  . CKD stage 5 due to type 2 diabetes mellitus ([place]) 11/16/2017  . Glaucoma due to type 2 diabetes mellitus ([place]) 11/16/2017  . Chronic generalized pain 11/16/2017  . Recurrent pneumonia 11/06/2017  . Diabetic gastroparesis ([place]) 11/06/2017  . Generalized abdominal pain 09/13/2017  . Pain in right shoulder 07/26/2017  . ESRD on dialysis ([place]) 03/28/2017  . Anemia due to end stage renal disease ([place]) 08/04/2016  . Elevated troponin     Past Medical History:  Diagnosis Date  . Arthritis   . Congestive heart failure (CHF) ([place])   .  Depression   . 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 Diabetic gastroparesis ([place]) 11/06/2017  . ESRD (end stage renal disease) on dialysis [facility])    M,W.F dialysis  . H/O seasonal allergies   . Hypertension   .  Pneumonia   . Sleep apnea    not using it now, on continuous O2 2 L during the day, 3 L during the night    Current Outpatient Medications  Medication Sig Dispense Refill  . acetaminophen (TYLENOL) 325 MG tablet Take 1-2 tablets (325-650 mg total) by mouth every 4 (four) hours as needed for mild pain.    Marland Kitchen albuterol (PROVENTIL HFA;VENTOLIN HFA) 108 (90 Base) MCG/ACT inhaler Inhale 1 puff into the lungs every 4 (four) hours as needed for wheezing or shortness of breath.     . calcium acetate (PHOSLO) 667 MG capsule Take 2,668 mg by mouth 3 (three) times daily with meals.    . carvedilol (COREG) 6.25 MG tablet Take 1 tablet (6.25 mg total) by mouth 2 (two) times daily with a meal. 60 tablet 0  . docusate sodium (COLACE) 100 MG capsule Take 1 capsule (100 mg total) by mouth 2 (two) times daily. 60 capsule 1  . ferric citrate (AURYXIA) 1 GM 210 MG(Fe) tablet Take 1 tablet (210 mg total) by mouth 3 (three) times daily with meals. 90 tablet 1  . fluticasone (FLONASE) 50 MCG/ACT nasal spray Place 1 spray into both nostrils daily as needed for allergies.     Marland Kitchen glucose blood test strip 1 each by Other route as needed for other. Use as instructed 100 each 11  . Insulin NPH, Human,, Isophane, (HUMULIN N KWIKPEN) 100 UNIT/ML Kiwkpen Inject 14 Units into the skin every morning. And pen needles 1/day (Patient taking differently: Inject 0-4 Units into the skin every morning. And pen needles 1/day) 15 mL 11  . isosorbide-hydrALAZINE (BIDIL) 20-37.5 MG tablet Take 1 tablet by mouth 3 (three) times daily. 90 tablet 0  . Lancets (ACCU-CHEK SOFT TOUCH) lancets Use as instructed 100 each 12  . latanoprost (XALATAN) 0.005 % ophthalmic solution Place 1 drop into both eyes at bedtime.    . levETIRAcetam (KEPPRA) 500 MG tablet Take 1 tablet (500 mg total) by mouth at bedtime. 30 tablet 0  . levothyroxine (SYNTHROID) 50 MCG tablet Take 1 tablet (50 mcg total) by mouth daily. 90 tablet 3  . metoCLOPramide (REGLAN) 5 MG  tablet Take 1 tablet (5 mg total) by mouth 3 (three) times daily before meals. (Patient taking differently: Take 5 mg by mouth 2 (two) times daily before a meal. ) 90 tablet 0  . multivitamin (RENA-VIT) TABS tablet Take 1 tablet by mouth at bedtime. 30 tablet 1  . omeprazole (PRILOSEC) 20 MG capsule Take 1 capsule (20 mg total) by mouth at bedtime. 90 capsule 3  . timolol (BETIMOL) 0.5 % ophthalmic solution Place 1 drop into both eyes 2 (two) times daily. 10 mL 1   No current facility-administered medications for this visit.    Allergies  Allergen Reactions  . Gabapentin Other (See Comments)    Altered mental status    Social History   Socioeconomic History  . Marital status: Married    Spouse name: Not on file  . Number of children: Not on file  . Years of education: Not on file  . Highest education level: Not on file  Occupational History  . Not on file  Tobacco Use  . Smoking status:  Never Smoker  . Smokeless tobacco: Never Used  Vaping Use  . Vaping Use: Never used  Substance and Sexual Activity  . Alcohol use: No  . Drug use: No  . Sexual activity: Not on file  Other Topics Concern  . Not on file  Social History Narrative  . Not on file   Social Determinants of Health   Financial Resource Strain:   . Difficulty of Paying Living Expenses:   Food Insecurity:   . Worried About Charity fundraiser in the Last Year:   . Arboriculturist in the Last Year:   Transportation Needs:   . Film/video editor (Medical):   Marland Kitchen Lack of Transportation (Non-Medical):   Physical Activity:   . Days of Exercise per Week:   . Minutes of Exercise per Session:   Stress:   . Feeling of Stress :   Social Connections:   . Frequency of Communication with Friends and Family:   . Frequency of Social Gatherings with Friends and Family:   . Attends Religious Services:   . Active Member of Clubs or Organizations:   . Attends Archivist Meetings:   Marland Kitchen Marital Status:     Intimate Partner Violence:   . Fear of Current or Ex-Partner:   . Emotionally Abused:   Marland Kitchen Physically Abused:   . Sexually Abused:     Review of Systems  Constitutional: Negative.  Negative for chills and fever.  HENT: Negative for congestion and sore throat.   Respiratory: Negative for cough and shortness of breath.   Cardiovascular: Negative for chest pain and palpitations.  Gastrointestinal: Positive for diarrhea. Negative for abdominal pain, blood in stool, nausea and vomiting.  Skin: Negative.  Negative for rash.  Neurological: Negative for dizziness and headaches.  All other systems reviewed and are negative.   Objective  Alert and oriented x3 in no apparent respiratory distress Vitals as reported by the patient: There were no vitals filed for this visit.  There are no diagnoses linked to this encounter.  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 difficile infection patient was advised to go back to the hospital and be evaluated today.   I discussed the assessment and treatment plan with the patient. The patient was provided an opportunity to ask questions and all were answered. The patient agreed with the plan and demonstrated an understanding of the instructions.   The patient was advised to call back or seek an in-person evaluation if the symptoms worsen or if the condition fails to improve as anticipated.  I provided 20 minutes of non-face-to-face time during this encounter.  Horald Pollen, MD  Primary Care at [facility]

## 2019-10-23 NOTE — ED Triage Notes (Signed)
Pt to ED with c/o diarrhea x's 2 weeks.   Pt was told to come to ED by his MD.  Pt is dialysis pt.  Last dialysis was yesterday

## 2019-10-24 ENCOUNTER — Other Ambulatory Visit: Payer: Self-pay

## 2019-10-24 LAB — SARS CORONAVIRUS 2 BY RT PCR (HOSPITAL ORDER, PERFORMED IN ~~LOC~~ HOSPITAL LAB): SARS Coronavirus 2: NEGATIVE

## 2019-10-24 NOTE — Discharge Instructions (Addendum)
There was no diarrhea during your time in the ED.  This is reassuring.  Your lab work is also reassuring. Please be sure to go to dialysis today. Follow-up with your primary care doctor for any further management of this issue.

## 2019-10-24 NOTE — ED Provider Notes (Signed)
[facility] EMERGENCY DEPARTMENT Provider Note   CSN: [id number] Arrival date & time: 10/23/19  1809     History Chief Complaint  Patient presents with  . Diarrhea    Howard Bell is a 54 y.o. male.  HPI      Howard Bell is a 54 y.o. male, with a history of CHF, DM, ESRD on dialysis, HTN, presenting to the ED with diarrhea that he states started a week ago.  Endorses 4-5 stools daily.  He states he has not had a bowel movement since arrival in the ED. Patient was admitted last week for CHF exacerbation and pneumonia. Patient is on a Monday, Wednesday, Friday dialysis schedule and states he has been compliant with the schedule. Patient underwent a video visit with his PCP yesterday and was advised to come to the ED for C. difficile testing. Patient states he is fully vaccinated for Covid.  Denies any sick contacts or persons close to him with similar symptoms. Denies fever/chills, nausea/vomiting, hematochezia/melena, abdominal pain, chest pain, shortness of breath, or any other complaints.   Past Medical History:  Diagnosis Date  . Arthritis   . Congestive heart failure (CHF) ([place])   . Depression   . 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 Diabetic gastroparesis ([place]) 11/06/2017  . ESRD (end stage renal disease) on dialysis [facility])    M,W.F dialysis  . H/O seasonal allergies   . Hypertension   . Pneumonia   . Sleep apnea    not using it now, on continuous O2 2 L during the day, 3 L during the night    Patient Active Problem List   Diagnosis Date Noted  . Metabolic encephalopathy 85/04/7739  . Community acquired pneumonia 10/09/2019  . Acute metabolic encephalopathy 28/78/6767  . Type 2 diabetes mellitus with ESRD (end-stage renal disease) ([place]) 10/09/2019  . Pneumonia 10/09/2019  . Hypotension 10/08/2019  . H/O stroke without residual deficits   . Acute on chronic combined systolic and diastolic CHF  (congestive heart failure) ([place]) 08/18/2019  . Seizures ([place]) 08/16/2019  . Abnormality of gait 01/30/2019  . S/P AKA (above knee amputation) bilateral ([place]) 11/07/2018  . Chronic hypercapnic respiratory failure ([place]) 10/02/2018  . OSA (obstructive sleep apnea)   . S/P AKA (above knee amputation) unilateral, right ([place])   . Diabetic peripheral neuropathy ([place])   . Status post bilateral above knee amputation ([place]) 09/08/2018  . Atherosclerosis of native arteries of extremities with gangrene, left leg ([place]) 09/05/2018  . Peripheral arterial disease ([place])   . Unilateral AKA, right ([place]) 08/17/2018  . Drug induced constipation   . Uncontrolled diabetes mellitus type 2 with peripheral artery disease ([place])   . Anemia of chronic disease   . Diabetes mellitus type 2 in nonobese (HCC)   . Supplemental oxygen dependent   . Unilateral AKA, left ([place]) 08/07/2018  . S/P BKA (below knee amputation), right ([place]) 08/04/2018  . Critical limb ischemia with history of revascularization of same extremity 05/09/2018  . 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 ([place]) 11/16/2017  . 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 ([place]) 11/16/2017  . CKD stage 5 due to type 2 diabetes mellitus ([place]) 11/16/2017  . Glaucoma due to type 2 diabetes mellitus ([place]) 11/16/2017  . Chronic generalized pain 11/16/2017  . Recurrent pneumonia 11/06/2017  . Diabetic gastroparesis ([place]) 11/06/2017  . Generalized abdominal pain 09/13/2017  . Pain in  right shoulder 07/26/2017  . ESRD on dialysis ([place]) 03/28/2017  . Anemia due to end stage renal disease ([place]) 08/04/2016  . Elevated troponin     Past Surgical History:  Procedure Laterality Date  . AMPUTATION Right 05/14/2018   Procedure: AMPUTATION BELOW KNEE;  Surgeon: Newt Minion, MD;  Location: [facility];  Service: Orthopedics;  Laterality: Right;  . AMPUTATION Right 08/04/2018   Procedure: RIGHT ABOVE KNEE  AMPUTATION;  Surgeon: Newt Minion, MD;  Location: [facility];  Service: Orthopedics;  Laterality: Right;  . AMPUTATION Left 09/08/2018   Procedure: LEFT ABOVE KNEE AMPUTATION;  Surgeon: Newt Minion, MD;  Location: [facility];  Service: Orthopedics;  Laterality: Left;  . APPLICATION OF WOUND VAC Left 09/08/2018   Procedure: Application Of Wound Vac;  Surgeon: Newt Minion, MD;  Location: [facility];  Service: Orthopedics;  Laterality: Left;  . ESOPHAGOGASTRODUODENOSCOPY (EGD) WITH PROPOFOL N/A 12/05/2017   Procedure: ESOPHAGOGASTRODUODENOSCOPY (EGD) WITH PROPOFOL;  Surgeon: Doran Stabler, MD;  Location: [facility];  Service: Gastroenterology;  Laterality: N/A;  . HERNIA REPAIR    . IR AV DIALY SHUNT INTRO NEEDLE/INTRACATH INITIAL W/PTA/IMG LEFT  03/30/2017       Family History  Problem Relation Age of Onset  . Diabetes Mother   . Hypertension Father     Social History   Tobacco Use  . Smoking status: Never Smoker  . Smokeless tobacco: Never Used  Vaping Use  . Vaping Use: Never used  Substance Use Topics  . Alcohol use: No  . Drug use: No    Home Medications Prior to Admission medications   Medication Sig Start Date End Date Taking? Authorizing Provider  acetaminophen (TYLENOL) 325 MG tablet Take 1-2 tablets (325-650 mg total) by mouth every 4 (four) hours as needed for mild pain. 09/26/18  Yes Love, Ivan Anchors, PA-C  albuterol (PROVENTIL HFA;VENTOLIN HFA) 108 (90 Base) MCG/ACT inhaler Inhale 1 puff into the lungs every 4 (four) hours as needed for wheezing or shortness of breath.    Yes [provider]  calcium acetate (PHOSLO) 667 MG capsule Take 2,668 mg by mouth 3 (three) times daily with meals.   Yes [provider]  carvedilol (COREG) 6.25 MG tablet Take 1 tablet (6.25 mg total) by mouth 2 (two) times daily with a meal. 08/19/19  Yes Thurnell Lose, MD  docusate sodium (COLACE) 100 MG capsule Take 1 capsule (100 mg total) by mouth 2 (two) times daily. Patient  taking differently: Take 100 mg by mouth 2 (two) times daily as needed for mild constipation.  01/22/19  Yes Sagardia, Ines Bloomer, MD  fluticasone [facility]) 50 MCG/ACT nasal spray Place 1 spray into both nostrils daily as needed for allergies.    Yes [provider]  Insulin NPH, Human,, Isophane, (HUMULIN N KWIKPEN) 100 UNIT/ML Kiwkpen Inject 14 Units into the skin every morning. And pen needles 1/day Patient taking differently: Inject 0-4 Units into the skin every morning. And pen needles 1/day 09/11/19  Yes Renato Shin, MD  isosorbide-hydrALAZINE (BIDIL) 20-37.5 MG tablet Take 1 tablet by mouth 3 (three) times daily. 08/19/19  Yes Thurnell Lose, MD  latanoprost (XALATAN) 0.005 % ophthalmic solution Place 1 drop into both eyes at bedtime.   Yes [provider]  levETIRAcetam (KEPPRA) 500 MG tablet Take 1 tablet (500 mg total) by mouth at bedtime. 08/19/19  Yes Thurnell Lose, MD  levothyroxine (SYNTHROID) 50 MCG tablet Take 1 tablet (50 mcg total) by  mouth daily. 09/12/19  Yes Renato Shin, MD  metoCLOPramide (REGLAN) 5 MG tablet Take 1 tablet (5 mg total) by mouth 3 (three) times daily before meals. Patient taking differently: Take 5 mg by mouth 2 (two) times daily before a meal.  08/30/18  Yes Love, Ivan Anchors, PA-C  multivitamin (RENA-VIT) TABS tablet Take 1 tablet by mouth at bedtime. 01/22/19  Yes Sagardia, Ines Bloomer, MD  omeprazole (PRILOSEC) 20 MG capsule Take 1 capsule (20 mg total) by mouth at bedtime. 01/22/19  Yes Sagardia, Ines Bloomer, MD  timolol (BETIMOL) 0.5 % ophthalmic solution Place 1 drop into both eyes 2 (two) times daily. 05/07/17  Yes English, Colletta Maryland D, PA  ferric citrate (AURYXIA) 1 GM 210 MG(Fe) tablet Take 1 tablet (210 mg total) by mouth 3 (three) times daily with meals. Patient not taking: Reported on 10/24/2019 09/29/18   Bary Leriche, PA-C  glucose blood test strip 1 each by Other route as needed for other. Use as instructed 10/31/18   Horald Pollen, MD  Lancets (ACCU-CHEK SOFT [facility]) lancets Use as instructed 10/31/18   Horald Pollen, MD    Allergies    Gabapentin  Review of Systems   Review of Systems  Constitutional: Negative for chills, diaphoresis and fever.  Respiratory: Negative for cough and shortness of breath.   Cardiovascular: Negative for chest pain.  Gastrointestinal: Positive for diarrhea. Negative for abdominal pain, blood in stool, nausea and vomiting.  Neurological: Negative for syncope and weakness.  All other systems reviewed and are negative.   Physical Exam Updated Vital Signs BP (!) 154/101   Pulse 94   Temp 97.7 F (36.5 C)   Resp 19   Wt 99.8 kg   SpO2 96%   BMI 29.84 kg/m   Physical Exam Vitals and nursing note reviewed.  Constitutional:      General: He is not in acute distress.    Appearance: He is well-developed. He is obese. He is not diaphoretic.  HENT:     Head: Normocephalic and atraumatic.     Mouth/Throat:     Mouth: Mucous membranes are moist.     Pharynx: Oropharynx is clear.  Eyes:     Conjunctiva/sclera: Conjunctivae normal.  Cardiovascular:     Rate and Rhythm: Normal rate and regular rhythm.     Pulses: Normal pulses.          Radial pulses are 2+ on the right side and 2+ on the left side.     Heart sounds: Normal heart sounds.  Pulmonary:     Effort: Pulmonary effort is normal. No respiratory distress.     Breath sounds: Normal breath sounds.  Abdominal:     Palpations: Abdomen is soft.     Tenderness: There is no abdominal tenderness. There is no guarding.  Musculoskeletal:     Cervical back: Neck supple.     Right lower leg: No edema.     Left lower leg: No edema.     Right Lower Extremity: Right leg is amputated above knee.     Left Lower Extremity: Left leg is amputated above knee.  Skin:    General: Skin is warm and dry.  Neurological:     Mental Status: He is alert and oriented to person, place, and time.  Psychiatric:        Mood and  Affect: Mood and affect normal.        Speech: Speech normal.        Behavior:  Behavior normal.     ED Results / Procedures / Treatments   Labs (all labs ordered are listed, but only abnormal results are displayed) Labs Reviewed  CBC WITH DIFFERENTIAL/PLATELET - Abnormal; Notable for the following components:      Result Value   RDW 18.1 (*)    All other components within normal limits  COMPREHENSIVE METABOLIC PANEL - Abnormal; Notable for the following components:   Chloride 93 (*)    Glucose, Bld 167 (*)    BUN 39 (*)    Creatinine, Ser 10.61 (*)    Albumin 3.4 (*)    GFR calc non Af Amer 5 (*)    GFR calc Af Amer 6 (*)    All other components within normal limits  GASTROINTESTINAL PANEL BY PCR, STOOL (REPLACES STOOL CULTURE)  C DIFFICILE QUICK SCREEN W PCR REFLEX  SARS CORONAVIRUS 2 BY RT PCR (HOSPITAL ORDER, [facility])    EKG None  Radiology No results found.  Procedures Procedures (including critical care time)  Medications Ordered in ED Medications - No data to display  ED Course  I have reviewed the triage vital signs and the nursing notes.  Pertinent labs & imaging results that were available during my care of the patient were reviewed by me and considered in my medical decision making (see chart for details).  Clinical Course as of Oct 24 923  Wed Oct 24, 2019  7867 Patient is supposed to be on 2 L supplemental O2 at home.  He was not on this oxygen when he arrived.  SPO2 stabilized once he was placed back on this supplemental O2.  SpO2(!): 86 % [SJ]  0915 Patient not tachycardic on reevaluation prior to discharge.  Pulse Rate(!): 101 [SJ]    Clinical Course User Index [SJ] Jacolyn Joaquin, Helane Gunther, PA-C   MDM Rules/Calculators/A&P                          Patient presents complaining of diarrhea for the past week. Patient is nontoxic appearing, afebrile, not tachycardic, not tachypneic, not hypotensive, maintains excellent SPO2  on prescribed supplemental O2, and is in no apparent distress.   I have reviewed the patient's chart to obtain more information.   I reviewed and interpreted the patient's labs. No leukocytosis.  Patient had no diarrhea during his long, over 15-hour, ED course.  Multiple abdominal exams benign. The patient was given instructions for home care as well as return precautions. Patient voices understanding of these instructions, accepts the plan, and is comfortable with discharge.    Findings and plan of care discussed with Antony Blackbird, MD. Dr. Sherry Ruffing personally evaluated and examined this patient.  Final Clinical Impression(s) / ED Diagnoses Final diagnoses:  Diarrhea, unspecified type    Rx / DC Orders ED Discharge Orders    None       Layla Maw 10/24/19 [id number]    Tegeler, Gwenyth Allegra, MD 10/24/19 1050

## 2019-10-25 ENCOUNTER — Telehealth: Payer: Self-pay | Admitting: *Deleted

## 2019-10-25 NOTE — Telephone Encounter (Signed)
Faxed order start of care for [facility] (PT/OT) Attn: Andree Elk, OST. Confirmation page 11:35 am.

## 2019-11-01 ENCOUNTER — Telehealth: Payer: Self-pay | Admitting: Emergency Medicine

## 2019-11-01 NOTE — Telephone Encounter (Signed)
Mark from [facility] is needing a verbal order for physical therapy. Call back number: [phone] Please advise.

## 2019-11-01 NOTE — Telephone Encounter (Signed)
I attempted to call Elta [place] to ok the verbal orders but no VM set up and was not able to LVM. Will try later.

## 2019-11-01 NOTE — Telephone Encounter (Signed)
Called Mark at [facility] concerning verbal order for PT no answer after about 10 rings unable to leave a message.

## 2019-11-02 ENCOUNTER — Telehealth: Payer: Self-pay

## 2019-11-02 NOTE — Telephone Encounter (Signed)
Left a msg for [place] with [facility] to give verbal orders for OT

## 2019-11-02 NOTE — Telephone Encounter (Signed)
Call unsuccessful, no answer no VM 3 attempts have been made if they call back Verbal can be given for PT

## 2019-11-02 NOTE — Telephone Encounter (Signed)
[facility] called requesting verbal from Dr. Mitchel Honour for OT

## 2019-11-07 ENCOUNTER — Emergency Department (HOSPITAL_COMMUNITY)
Admission: EM | Admit: 2019-11-07 | Discharge: 2019-11-07 | Disposition: A | Discharge status: Home / Self Care | Payer: Medicare Other | Attending: Emergency Medicine | Admitting: Emergency Medicine

## 2019-11-07 ENCOUNTER — Emergency Department (HOSPITAL_COMMUNITY): Payer: Medicare Other

## 2019-11-07 ENCOUNTER — Encounter (HOSPITAL_COMMUNITY): Payer: Self-pay

## 2019-11-07 ENCOUNTER — Other Ambulatory Visit: Payer: Self-pay

## 2019-11-07 DIAGNOSIS — Z20822 Contact with and (suspected) exposure to covid-19: Secondary | ICD-10-CM | POA: Diagnosis not present

## 2019-11-07 DIAGNOSIS — Z5321 Procedure and treatment not carried out due to patient leaving prior to being seen by health care provider: Secondary | ICD-10-CM | POA: Insufficient documentation

## 2019-11-07 DIAGNOSIS — R05 Cough: Secondary | ICD-10-CM | POA: Diagnosis present

## 2019-11-07 LAB — BASIC METABOLIC PANEL
Anion gap: 13 (ref 5–15)
BUN: 17 mg/dL (ref 6–20)
CO2: 31 mmol/L (ref 22–32)
Calcium: 9.6 mg/dL (ref 8.9–10.3)
Chloride: 90 mmol/L — ABNORMAL LOW (ref 98–111)
Creatinine, Ser: 6.91 mg/dL — ABNORMAL HIGH (ref 0.61–1.24)
GFR calc Af Amer: 10 mL/min — ABNORMAL LOW (ref >60)
GFR calc non Af Amer: 8 mL/min — ABNORMAL LOW (ref >60)
Glucose, Bld: 215 mg/dL — ABNORMAL HIGH (ref 70–99)
Potassium: 3.7 mmol/L (ref 3.5–5.1)
Sodium: 134 mmol/L — ABNORMAL LOW (ref 135–145)

## 2019-11-07 LAB — CBC
HCT: 49.5 % (ref 39.0–52.0)
Hemoglobin: 15.9 g/dL (ref 13.0–17.0)
MCH: 30.5 pg (ref 26.0–34.0)
MCHC: 32.1 g/dL (ref 30.0–36.0)
MCV: 95 fL (ref 80.0–100.0)
Platelets: 121 10*3/uL — ABNORMAL LOW (ref 150–400)
RBC: 5.21 MIL/uL (ref 4.22–5.81)
RDW: 16.6 % — ABNORMAL HIGH (ref 11.5–15.5)
WBC: 5 10*3/uL (ref 4.0–10.5)
nRBC: 0 % (ref 0.0–0.2)

## 2019-11-07 LAB — TROPONIN I (HIGH SENSITIVITY): Troponin I (High Sensitivity): 50 ng/L — ABNORMAL HIGH (ref <18)

## 2019-11-07 LAB — SARS CORONAVIRUS 2 BY RT PCR (HOSPITAL ORDER, PERFORMED IN ~~LOC~~ HOSPITAL LAB): SARS Coronavirus 2: NEGATIVE

## 2019-11-07 NOTE — ED Notes (Signed)
Triage RN notified of pts O2 sat. Place on 2L.

## 2019-11-07 NOTE — ED Notes (Signed)
Pts wife walked past this NT and stated "she told me to tell you I am taking my husband home" and continued walking without a chance to speak with pt or wife.

## 2019-11-07 NOTE — ED Triage Notes (Signed)
Pt arrives POV for eval of cough onset this AM. Pt reports that he has been coughing so hard that he passes out, reports episode of passing out this afternoon at dialysis. States he woke up and wasn't sure where he was. Pt reports c/o ongoing cough and CP on arrival here.

## 2019-11-12 ENCOUNTER — Inpatient Hospital Stay (HOSPITAL_COMMUNITY)
Admission: EM | Admit: 2019-11-12 | Discharge: 2019-11-17 | DRG: 193 | Disposition: A | Discharge status: Home / Self Care | Payer: Medicare Other | Attending: Internal Medicine | Admitting: Internal Medicine

## 2019-11-12 ENCOUNTER — Encounter (HOSPITAL_COMMUNITY): Payer: Self-pay

## 2019-11-12 ENCOUNTER — Other Ambulatory Visit: Payer: Self-pay

## 2019-11-12 ENCOUNTER — Emergency Department (HOSPITAL_COMMUNITY): Payer: Medicare Other

## 2019-11-12 ENCOUNTER — Telehealth: Payer: Self-pay | Admitting: *Deleted

## 2019-11-12 DIAGNOSIS — D631 Anemia in chronic kidney disease: Secondary | ICD-10-CM | POA: Diagnosis present

## 2019-11-12 DIAGNOSIS — Z888 Allergy status to other drugs, medicaments and biological substances status: Secondary | ICD-10-CM

## 2019-11-12 DIAGNOSIS — Z6826 Body mass index (BMI) 26.0-26.9, adult: Secondary | ICD-10-CM

## 2019-11-12 DIAGNOSIS — R778 Other specified abnormalities of plasma proteins: Secondary | ICD-10-CM

## 2019-11-12 DIAGNOSIS — Z20822 Contact with and (suspected) exposure to covid-19: Secondary | ICD-10-CM | POA: Diagnosis present

## 2019-11-12 DIAGNOSIS — I5023 Acute on chronic systolic (congestive) heart failure: Secondary | ICD-10-CM | POA: Diagnosis present

## 2019-11-12 DIAGNOSIS — J189 Pneumonia, unspecified organism: Principal | ICD-10-CM | POA: Diagnosis present

## 2019-11-12 DIAGNOSIS — J90 Pleural effusion, not elsewhere classified: Secondary | ICD-10-CM

## 2019-11-12 DIAGNOSIS — J9612 Chronic respiratory failure with hypercapnia: Secondary | ICD-10-CM | POA: Diagnosis present

## 2019-11-12 DIAGNOSIS — Z8249 Family history of ischemic heart disease and other diseases of the circulatory system: Secondary | ICD-10-CM

## 2019-11-12 DIAGNOSIS — Z833 Family history of diabetes mellitus: Secondary | ICD-10-CM

## 2019-11-12 DIAGNOSIS — E669 Obesity, unspecified: Secondary | ICD-10-CM | POA: Diagnosis present

## 2019-11-12 DIAGNOSIS — E872 Acidosis: Secondary | ICD-10-CM | POA: Diagnosis present

## 2019-11-12 DIAGNOSIS — E1151 Type 2 diabetes mellitus with diabetic peripheral angiopathy without gangrene: Secondary | ICD-10-CM | POA: Diagnosis present

## 2019-11-12 DIAGNOSIS — N186 End stage renal disease: Secondary | ICD-10-CM

## 2019-11-12 DIAGNOSIS — Z89511 Acquired absence of right leg below knee: Secondary | ICD-10-CM

## 2019-11-12 DIAGNOSIS — G4733 Obstructive sleep apnea (adult) (pediatric): Secondary | ICD-10-CM | POA: Diagnosis present

## 2019-11-12 DIAGNOSIS — R059 Cough, unspecified: Secondary | ICD-10-CM

## 2019-11-12 DIAGNOSIS — Z9981 Dependence on supplemental oxygen: Secondary | ICD-10-CM

## 2019-11-12 DIAGNOSIS — J302 Other seasonal allergic rhinitis: Secondary | ICD-10-CM | POA: Diagnosis present

## 2019-11-12 DIAGNOSIS — K3184 Gastroparesis: Secondary | ICD-10-CM | POA: Diagnosis present

## 2019-11-12 DIAGNOSIS — Z794 Long term (current) use of insulin: Secondary | ICD-10-CM

## 2019-11-12 DIAGNOSIS — R4182 Altered mental status, unspecified: Secondary | ICD-10-CM | POA: Diagnosis present

## 2019-11-12 DIAGNOSIS — F329 Major depressive disorder, single episode, unspecified: Secondary | ICD-10-CM | POA: Diagnosis present

## 2019-11-12 DIAGNOSIS — E1142 Type 2 diabetes mellitus with diabetic polyneuropathy: Secondary | ICD-10-CM | POA: Diagnosis present

## 2019-11-12 DIAGNOSIS — I132 Hypertensive heart and chronic kidney disease with heart failure and with stage 5 chronic kidney disease, or end stage renal disease: Secondary | ICD-10-CM | POA: Diagnosis present

## 2019-11-12 DIAGNOSIS — Z9889 Other specified postprocedural states: Secondary | ICD-10-CM

## 2019-11-12 DIAGNOSIS — R05 Cough: Secondary | ICD-10-CM | POA: Diagnosis not present

## 2019-11-12 DIAGNOSIS — Z89612 Acquired absence of left leg above knee: Secondary | ICD-10-CM

## 2019-11-12 DIAGNOSIS — Z79899 Other long term (current) drug therapy: Secondary | ICD-10-CM

## 2019-11-12 DIAGNOSIS — Z89611 Acquired absence of right leg above knee: Secondary | ICD-10-CM

## 2019-11-12 DIAGNOSIS — N2581 Secondary hyperparathyroidism of renal origin: Secondary | ICD-10-CM | POA: Diagnosis present

## 2019-11-12 DIAGNOSIS — E8779 Other fluid overload: Secondary | ICD-10-CM | POA: Diagnosis present

## 2019-11-12 DIAGNOSIS — Z992 Dependence on renal dialysis: Secondary | ICD-10-CM

## 2019-11-12 DIAGNOSIS — J440 Chronic obstructive pulmonary disease with acute lower respiratory infection: Secondary | ICD-10-CM | POA: Diagnosis present

## 2019-11-12 DIAGNOSIS — E1122 Type 2 diabetes mellitus with diabetic chronic kidney disease: Secondary | ICD-10-CM | POA: Diagnosis present

## 2019-11-12 DIAGNOSIS — E1143 Type 2 diabetes mellitus with diabetic autonomic (poly)neuropathy: Secondary | ICD-10-CM | POA: Diagnosis present

## 2019-11-12 DIAGNOSIS — E039 Hypothyroidism, unspecified: Secondary | ICD-10-CM | POA: Diagnosis present

## 2019-11-12 DIAGNOSIS — Z79890 Hormone replacement therapy (postmenopausal): Secondary | ICD-10-CM

## 2019-11-12 HISTORY — DX: Peripheral vascular disease, unspecified: I73.9

## 2019-11-12 HISTORY — DX: Hypothyroidism, unspecified: E03.9

## 2019-11-12 LAB — BASIC METABOLIC PANEL
Anion gap: 19 — ABNORMAL HIGH (ref 5–15)
BUN: 39 mg/dL — ABNORMAL HIGH (ref 6–20)
CO2: 25 mmol/L (ref 22–32)
Calcium: 10.1 mg/dL (ref 8.9–10.3)
Chloride: 91 mmol/L — ABNORMAL LOW (ref 98–111)
Creatinine, Ser: 11.55 mg/dL — ABNORMAL HIGH (ref 0.61–1.24)
GFR calc Af Amer: 5 mL/min — ABNORMAL LOW (ref >60)
GFR calc non Af Amer: 4 mL/min — ABNORMAL LOW (ref >60)
Glucose, Bld: 125 mg/dL — ABNORMAL HIGH (ref 70–99)
Potassium: 4.6 mmol/L (ref 3.5–5.1)
Sodium: 135 mmol/L (ref 135–145)

## 2019-11-12 LAB — CBC
HCT: 51.8 % (ref 39.0–52.0)
Hemoglobin: 16.6 g/dL (ref 13.0–17.0)
MCH: 30.3 pg (ref 26.0–34.0)
MCHC: 32 g/dL (ref 30.0–36.0)
MCV: 94.7 fL (ref 80.0–100.0)
Platelets: 120 10*3/uL — ABNORMAL LOW (ref 150–400)
RBC: 5.47 MIL/uL (ref 4.22–5.81)
RDW: 17.3 % — ABNORMAL HIGH (ref 11.5–15.5)
WBC: 6 10*3/uL (ref 4.0–10.5)
nRBC: 0 % (ref 0.0–0.2)

## 2019-11-12 LAB — TROPONIN I (HIGH SENSITIVITY): Troponin I (High Sensitivity): 55 ng/L — ABNORMAL HIGH (ref <18)

## 2019-11-12 LAB — SARS CORONAVIRUS 2 BY RT PCR (HOSPITAL ORDER, PERFORMED IN ~~LOC~~ HOSPITAL LAB): SARS Coronavirus 2: NEGATIVE

## 2019-11-12 LAB — MAGNESIUM: Magnesium: 2.6 mg/dL — ABNORMAL HIGH (ref 1.7–2.4)

## 2019-11-12 LAB — BRAIN NATRIURETIC PEPTIDE: B Natriuretic Peptide: 1460.5 pg/mL — ABNORMAL HIGH (ref 0.0–100.0)

## 2019-11-12 LAB — PHOSPHORUS: Phosphorus: 9.1 mg/dL — ABNORMAL HIGH (ref 2.5–4.6)

## 2019-11-12 NOTE — ED Triage Notes (Signed)
Patient complains of cough since Saturday and reports syncope with same over the weekend. Patient arrived on oxygen and uses 3l at home. Patient alert and oriented, denies pain. NAD

## 2019-11-12 NOTE — ED Provider Notes (Signed)
[facility] EMERGENCY DEPARTMENT Provider Note   CSN: [id number] Arrival date & time: 11/12/19  1040     History No chief complaint on file.   Howard Bell is a 54 y.o. male medical history significant for insulin-dependent diabetes, hypertension, CHF, ESRD dialyzing Monday/Wednesday/Friday via left upper extremity aVF presented to the ED today complaining of cough.  Patient states that he has had a persistent and progressively worsening cough over the last 2 weeks, he does endorse some associated nausea and vomiting and diarrhea.  Both yesterday and today he endorses syncope in the setting of a coughing spell both which are brief with return to baseline to follow.  Patient states he has no history of the same.  Endorses sharp chest pain with a cough as well.  The history is provided by the patient.  Illness Quality:  Cough Severity:  Severe Onset quality:  Gradual Duration:  2 weeks Timing:  Constant Progression:  Worsening Chronicity:  New Context:  Flulike illness, Covid vaccinated Associated symptoms: cough, diarrhea, fatigue, nausea and shortness of breath   Associated symptoms: no abdominal pain, no chest pain, no fever, no headaches, no rash and no vomiting        Past Medical History:  Diagnosis Date  . Arthritis   . Congestive heart failure (CHF) ([place])   . Depression   . 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 Diabetic gastroparesis ([place]) 11/06/2017  . ESRD (end stage renal disease) on dialysis [facility])    M,W.F dialysis  . H/O seasonal allergies   . Hypertension   . Pneumonia   . Sleep apnea    not using it now, on continuous O2 2 L during the day, 3 L during the night    Patient Active Problem List   Diagnosis Date Noted  . Metabolic encephalopathy 36/46/8032  . Community acquired pneumonia 10/09/2019  . Acute metabolic encephalopathy 03/14/8249  . Type 2 diabetes mellitus with ESRD (end-stage renal  disease) ([place]) 10/09/2019  . Pneumonia 10/09/2019  . Hypotension 10/08/2019  . H/O stroke without residual deficits   . Acute on chronic combined systolic and diastolic CHF (congestive heart failure) ([place]) 08/18/2019  . Seizures ([place]) 08/16/2019  . Abnormality of gait 01/30/2019  . S/P AKA (above knee amputation) bilateral ([place]) 11/07/2018  . Chronic hypercapnic respiratory failure ([place]) 10/02/2018  . OSA (obstructive sleep apnea)   . S/P AKA (above knee amputation) unilateral, right ([place])   . Diabetic peripheral neuropathy ([place])   . Status post bilateral above knee amputation ([place]) 09/08/2018  . Atherosclerosis of native arteries of extremities with gangrene, left leg ([place]) 09/05/2018  . Peripheral arterial disease ([place])   . Unilateral AKA, right ([place]) 08/17/2018  . Drug induced constipation   . Uncontrolled diabetes mellitus type 2 with peripheral artery disease ([place])   . Anemia of chronic disease   . Diabetes mellitus type 2 in nonobese (HCC)   . Supplemental oxygen dependent   . Unilateral AKA, left ([place]) 08/07/2018  . S/P BKA (below knee amputation), right ([place]) 08/04/2018  . Critical limb ischemia with history of revascularization of same extremity 05/09/2018  . 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 ([place]) 11/16/2017  . 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 ([place]) 11/16/2017  . CKD stage 5 due to type 2 diabetes mellitus ([place]) 11/16/2017  . Glaucoma due to type 2 diabetes mellitus ([place]) 11/16/2017  . Chronic generalized  pain 11/16/2017  . Recurrent pneumonia 11/06/2017  . Diabetic gastroparesis ([place]) 11/06/2017  . Generalized abdominal pain 09/13/2017  . Pain in right shoulder 07/26/2017  . ESRD on dialysis ([place]) 03/28/2017  . Anemia due to end stage renal disease ([place]) 08/04/2016  . Elevated troponin     Past Surgical History:  Procedure Laterality Date  . AMPUTATION Right 05/14/2018   Procedure:  AMPUTATION BELOW KNEE;  Surgeon: Newt Minion, MD;  Location: [facility];  Service: Orthopedics;  Laterality: Right;  . AMPUTATION Right 08/04/2018   Procedure: RIGHT ABOVE KNEE AMPUTATION;  Surgeon: Newt Minion, MD;  Location: [facility];  Service: Orthopedics;  Laterality: Right;  . AMPUTATION Left 09/08/2018   Procedure: LEFT ABOVE KNEE AMPUTATION;  Surgeon: Newt Minion, MD;  Location: [facility];  Service: Orthopedics;  Laterality: Left;  . APPLICATION OF WOUND VAC Left 09/08/2018   Procedure: Application Of Wound Vac;  Surgeon: Newt Minion, MD;  Location: [facility];  Service: Orthopedics;  Laterality: Left;  . ESOPHAGOGASTRODUODENOSCOPY (EGD) WITH PROPOFOL N/A 12/05/2017   Procedure: ESOPHAGOGASTRODUODENOSCOPY (EGD) WITH PROPOFOL;  Surgeon: Doran Stabler, MD;  [facility];  Service: Gastroenterology;  Laterality: N/A;  . HERNIA REPAIR    . IR AV DIALY SHUNT INTRO NEEDLE/INTRACATH INITIAL W/PTA/IMG LEFT  03/30/2017       Family History  Problem Relation Age of Onset  . Diabetes Mother   . Hypertension Father     Social History   Tobacco Use  . Smoking status: Never Smoker  . Smokeless tobacco: Never Used  Vaping Use  . Vaping Use: Never used  Substance Use Topics  . Alcohol use: No  . Drug use: No    Home Medications Prior to Admission medications   Medication Sig Start Date End Date Taking? Authorizing Provider  acetaminophen (TYLENOL) 325 MG tablet Take 1-2 tablets (325-650 mg total) by mouth every 4 (four) hours as needed for mild pain. 09/26/18   Love, Ivan Anchors, PA-C  albuterol (PROVENTIL HFA;VENTOLIN HFA) 108 (90 Base) MCG/ACT inhaler Inhale 1 puff into the lungs every 4 (four) hours as needed for wheezing or shortness of breath.     [provider]  calcium acetate (PHOSLO) 667 MG capsule Take 2,668 mg by mouth 3 (three) times daily with meals.    [provider]  carvedilol (COREG) 6.25 MG tablet Take 1 tablet (6.25 mg total) by mouth 2 (two) times  daily with a meal. 08/19/19   Thurnell Lose, MD  docusate sodium (COLACE) 100 MG capsule Take 1 capsule (100 mg total) by mouth 2 (two) times daily. Patient taking differently: Take 100 mg by mouth 2 (two) times daily as needed for mild constipation.  01/22/19   Horald Pollen, MD  ferric citrate (AURYXIA) 1 GM 210 MG(Fe) tablet Take 1 tablet (210 mg total) by mouth 3 (three) times daily with meals. Patient not taking: Reported on 10/24/2019 09/29/18   Love, Ivan Anchors, PA-C  fluticasone [facility]) 50 MCG/ACT nasal spray Place 1 spray into both nostrils daily as needed for allergies.     [provider]  glucose blood test strip 1 each by Other route as needed for other. Use as instructed 10/31/18   Horald Pollen, MD  Insulin NPH, Human,, Isophane, (HUMULIN N KWIKPEN) 100 UNIT/ML Kiwkpen Inject 14 Units into the skin every morning. And pen needles 1/day Patient taking differently: Inject 0-4 Units into the skin every morning. And pen needles 1/day 09/11/19  Renato Shin, MD  isosorbide-hydrALAZINE (BIDIL) 20-37.5 MG tablet Take 1 tablet by mouth 3 (three) times daily. 08/19/19   Thurnell Lose, MD  Lancets (ACCU-CHEK SOFT [facility]) lancets Use as instructed 10/31/18   Horald Pollen, MD  latanoprost (XALATAN) 0.005 % ophthalmic solution Place 1 drop into both eyes at bedtime.    [provider]  levETIRAcetam (KEPPRA) 500 MG tablet Take 1 tablet (500 mg total) by mouth at bedtime. 08/19/19   Thurnell Lose, MD  levothyroxine (SYNTHROID) 50 MCG tablet Take 1 tablet (50 mcg total) by mouth daily. 09/12/19   Renato Shin, MD  metoCLOPramide (REGLAN) 5 MG tablet Take 1 tablet (5 mg total) by mouth 3 (three) times daily before meals. Patient taking differently: Take 5 mg by mouth 2 (two) times daily before a meal.  08/30/18   Love, Ivan Anchors, PA-C  multivitamin (RENA-VIT) TABS tablet Take 1 tablet by mouth at bedtime. 01/22/19   Horald Pollen, MD  omeprazole  (PRILOSEC) 20 MG capsule Take 1 capsule (20 mg total) by mouth at bedtime. 01/22/19   Horald Pollen, MD  timolol (BETIMOL) 0.5 % ophthalmic solution Place 1 drop into both eyes 2 (two) times daily. 05/07/17   Ivar Drape D, PA    Allergies    Gabapentin  Review of Systems   Review of Systems  Constitutional: Positive for chills and fatigue. Negative for fever.  HENT: Negative for facial swelling and voice change.   Eyes: Negative for redness and visual disturbance.  Respiratory: Positive for cough and shortness of breath.   Cardiovascular: Negative for chest pain and palpitations.  Gastrointestinal: Positive for diarrhea and nausea. Negative for abdominal pain and vomiting.  Genitourinary: Negative for difficulty urinating and dysuria.  Musculoskeletal: Negative for gait problem and joint swelling.  Skin: Negative for rash and wound.  Neurological: Positive for syncope. Negative for dizziness and headaches.  Psychiatric/Behavioral: Negative for confusion and suicidal ideas.    Physical Exam Updated Vital Signs BP (!) 119/98 (BP Location: Right Arm)   Pulse 87   Temp 97.8 F (36.6 C) (Oral)   Resp 18   SpO2 96%   Physical Exam Constitutional:      General: He is not in acute distress.    Appearance: He is obese. He is ill-appearing.  HENT:     Head: Normocephalic and atraumatic.     Mouth/Throat:     Mouth: Mucous membranes are moist.     Pharynx: Oropharynx is clear.  Eyes:     General: No scleral icterus.    Pupils: Pupils are equal, round, and reactive to light.  Cardiovascular:     Rate and Rhythm: Normal rate and regular rhythm.     Pulses: Normal pulses.  Pulmonary:     Effort: Pulmonary effort is normal. No respiratory distress.     Comments: Presents diminished in the right, crackles present on the left Abdominal:     General: There is no distension.     Tenderness: There is no abdominal tenderness.  Musculoskeletal:        General: No  tenderness or deformity.     Cervical back: Normal range of motion and neck supple.     Comments: Bilateral AKA  Skin:    Comments: Small skin breakdown to the right posterior thigh  Neurological:     General: No focal deficit present.     Mental Status: He is alert and oriented to person, place, and time.  Psychiatric:  Mood and Affect: Mood normal.        Behavior: Behavior normal.     ED Results / Procedures / Treatments   Labs (all labs ordered are listed, but only abnormal results are displayed) Labs Reviewed  BASIC METABOLIC PANEL - Abnormal; Notable for the following components:      Result Value   Chloride 91 (*)    Glucose, Bld 125 (*)    BUN 39 (*)    Creatinine, Ser 11.55 (*)    GFR calc non Af Amer 4 (*)    GFR calc Af Amer 5 (*)    Anion gap 19 (*)    All other components within normal limits  CBC - Abnormal; Notable for the following components:   RDW 17.3 (*)    Platelets 120 (*)    All other components within normal limits  URINALYSIS, ROUTINE W REFLEX MICROSCOPIC    EKG None  Radiology No results found.  Procedures Procedures (including critical care time)  Medications Ordered in ED Medications - No data to display  ED Course  I have reviewed the triage vital signs and the nursing notes.  Pertinent labs & imaging results that were available during my care of the patient were reviewed by me and considered in my medical decision making (see chart for details).  Clinical Course as of Nov 11 2248  Mon Nov 12, 2019  2057 Likely due to the patient's uremia  Anion gap(!): 19 [JR]    Clinical Course User Index [JR] Renold Genta, MD   MDM Rules/Calculators/A&P                          Patient presents to ED for severe worsening cough of her 2-week interval in the setting of a flulike illness.  Also exacerbating factor which is the reason for his admission is syncope in the setting of this cough.  He states this is happened twice over  the last couple days, both of brief duration however he states no history of the same.  On exam he is in mild distress due to this cough and appears grossly volume overloaded.  Of note he missed dialysis today because he has been waiting in the emergency room, reports full session on Friday.  Vital signs reassuringly normal on home oxygen requirement of 3 L.  He is uncomfortable appearing.  We will obtain broad metabolic work-up with attention to volume overload with BNP, troponin, chest x-ray, chemistries, CBC.  CBC unremarkable chemistry showing significant uremia, anion gap of metabolic acidosis likely related.  No concerning electrolyte abnormalities.  Chest x-ray showing a very large right-sided pleural effusion which could very well be the cause the patient's symptoms.  BNP the highest it has ever been.  Troponin elevated at 55 likely due to ESRD, will trend.  Chest x-ray compared to prior, large pleural effusion noted on the 18th but on review, patient left that being seen on presentation during which this was obtained.  Does have a history of right-sided pleural effusion however not this large.  Pleural effusion viewed under ultrasound and does appear to be extremely large and feel the patient would benefit from thoracentesis, likely this is contributing to the patient's symptoms.  To better visualize pleural effusion, evaluate for associated malignancy or pneumonia, will obtain CT PE study particularly with a history of syncope   Final Clinical Impression(s) / ED Diagnoses Final diagnoses:  None    Rx / DC Orders  ED Discharge Orders    None     12:34 AM patient signed out to oncoming provider Quincy Carnes and Dr. Randal Buba please see their documentation for the remainder of the patient's work-up and ultimate disposition.   Renold Genta, MD 11/13/19 Corey Skains    Charlesetta Shanks, MD 11/22/19 [phone]

## 2019-11-12 NOTE — Telephone Encounter (Signed)
Faxed two different orders to [facility]: Andree Elk OST. One of the orders was for start of care for [facility] OT evaluation. Confirmation page 1:32 pm.

## 2019-11-13 ENCOUNTER — Encounter (HOSPITAL_COMMUNITY): Payer: Self-pay | Admitting: Emergency Medicine

## 2019-11-13 ENCOUNTER — Inpatient Hospital Stay (HOSPITAL_COMMUNITY): Payer: Medicare Other

## 2019-11-13 ENCOUNTER — Emergency Department (HOSPITAL_COMMUNITY): Payer: Medicare Other

## 2019-11-13 DIAGNOSIS — I132 Hypertensive heart and chronic kidney disease with heart failure and with stage 5 chronic kidney disease, or end stage renal disease: Secondary | ICD-10-CM | POA: Diagnosis present

## 2019-11-13 DIAGNOSIS — Z992 Dependence on renal dialysis: Secondary | ICD-10-CM | POA: Diagnosis not present

## 2019-11-13 DIAGNOSIS — J9612 Chronic respiratory failure with hypercapnia: Secondary | ICD-10-CM | POA: Diagnosis present

## 2019-11-13 DIAGNOSIS — E1142 Type 2 diabetes mellitus with diabetic polyneuropathy: Secondary | ICD-10-CM | POA: Diagnosis present

## 2019-11-13 DIAGNOSIS — N186 End stage renal disease: Secondary | ICD-10-CM | POA: Diagnosis present

## 2019-11-13 DIAGNOSIS — E8779 Other fluid overload: Secondary | ICD-10-CM | POA: Diagnosis present

## 2019-11-13 DIAGNOSIS — I5023 Acute on chronic systolic (congestive) heart failure: Secondary | ICD-10-CM | POA: Diagnosis present

## 2019-11-13 DIAGNOSIS — Z20822 Contact with and (suspected) exposure to covid-19: Secondary | ICD-10-CM | POA: Diagnosis present

## 2019-11-13 DIAGNOSIS — J189 Pneumonia, unspecified organism: Secondary | ICD-10-CM | POA: Diagnosis present

## 2019-11-13 DIAGNOSIS — G4733 Obstructive sleep apnea (adult) (pediatric): Secondary | ICD-10-CM | POA: Diagnosis present

## 2019-11-13 DIAGNOSIS — E872 Acidosis: Secondary | ICD-10-CM | POA: Diagnosis present

## 2019-11-13 DIAGNOSIS — E1143 Type 2 diabetes mellitus with diabetic autonomic (poly)neuropathy: Secondary | ICD-10-CM | POA: Diagnosis present

## 2019-11-13 DIAGNOSIS — J440 Chronic obstructive pulmonary disease with acute lower respiratory infection: Secondary | ICD-10-CM | POA: Diagnosis present

## 2019-11-13 DIAGNOSIS — K3184 Gastroparesis: Secondary | ICD-10-CM | POA: Diagnosis present

## 2019-11-13 DIAGNOSIS — E039 Hypothyroidism, unspecified: Secondary | ICD-10-CM | POA: Diagnosis present

## 2019-11-13 DIAGNOSIS — E669 Obesity, unspecified: Secondary | ICD-10-CM | POA: Diagnosis present

## 2019-11-13 DIAGNOSIS — E1122 Type 2 diabetes mellitus with diabetic chronic kidney disease: Secondary | ICD-10-CM | POA: Diagnosis present

## 2019-11-13 DIAGNOSIS — N2581 Secondary hyperparathyroidism of renal origin: Secondary | ICD-10-CM | POA: Diagnosis present

## 2019-11-13 DIAGNOSIS — J90 Pleural effusion, not elsewhere classified: Secondary | ICD-10-CM | POA: Diagnosis not present

## 2019-11-13 DIAGNOSIS — J302 Other seasonal allergic rhinitis: Secondary | ICD-10-CM | POA: Diagnosis present

## 2019-11-13 DIAGNOSIS — E1151 Type 2 diabetes mellitus with diabetic peripheral angiopathy without gangrene: Secondary | ICD-10-CM | POA: Diagnosis present

## 2019-11-13 DIAGNOSIS — R05 Cough: Secondary | ICD-10-CM | POA: Diagnosis present

## 2019-11-13 DIAGNOSIS — Z794 Long term (current) use of insulin: Secondary | ICD-10-CM | POA: Diagnosis not present

## 2019-11-13 DIAGNOSIS — D631 Anemia in chronic kidney disease: Secondary | ICD-10-CM | POA: Diagnosis present

## 2019-11-13 DIAGNOSIS — F329 Major depressive disorder, single episode, unspecified: Secondary | ICD-10-CM | POA: Diagnosis present

## 2019-11-13 DIAGNOSIS — R4182 Altered mental status, unspecified: Secondary | ICD-10-CM | POA: Diagnosis present

## 2019-11-13 HISTORY — PX: IR THORACENTESIS ASP PLEURAL SPACE W/IMG GUIDE: IMG5380

## 2019-11-13 LAB — BASIC METABOLIC PANEL
Anion gap: 24 — ABNORMAL HIGH (ref 5–15)
BUN: 47 mg/dL — ABNORMAL HIGH (ref 6–20)
CO2: 21 mmol/L — ABNORMAL LOW (ref 22–32)
Calcium: 9.9 mg/dL (ref 8.9–10.3)
Chloride: 89 mmol/L — ABNORMAL LOW (ref 98–111)
Creatinine, Ser: 12.15 mg/dL — ABNORMAL HIGH (ref 0.61–1.24)
GFR calc Af Amer: 5 mL/min — ABNORMAL LOW (ref >60)
GFR calc non Af Amer: 4 mL/min — ABNORMAL LOW (ref >60)
Glucose, Bld: 59 mg/dL — ABNORMAL LOW (ref 70–99)
Potassium: 4.8 mmol/L (ref 3.5–5.1)
Sodium: 134 mmol/L — ABNORMAL LOW (ref 135–145)

## 2019-11-13 LAB — CBC
HCT: 53.3 % — ABNORMAL HIGH (ref 39.0–52.0)
Hemoglobin: 16.8 g/dL (ref 13.0–17.0)
MCH: 30.3 pg (ref 26.0–34.0)
MCHC: 31.5 g/dL (ref 30.0–36.0)
MCV: 96 fL (ref 80.0–100.0)
Platelets: 120 10*3/uL — ABNORMAL LOW (ref 150–400)
RBC: 5.55 MIL/uL (ref 4.22–5.81)
RDW: 17.4 % — ABNORMAL HIGH (ref 11.5–15.5)
WBC: 5.9 10*3/uL (ref 4.0–10.5)
nRBC: 0 % (ref 0.0–0.2)

## 2019-11-13 LAB — CBG MONITORING, ED: Glucose-Capillary: 145 mg/dL — ABNORMAL HIGH (ref 70–99)

## 2019-11-13 LAB — BODY FLUID CELL COUNT WITH DIFFERENTIAL
Eos, Fluid: 0 %
Lymphs, Fluid: 91 %
Monocyte-Macrophage-Serous Fluid: 2 % — ABNORMAL LOW (ref 50–90)
Neutrophil Count, Fluid: 7 % (ref 0–25)
Total Nucleated Cell Count, Fluid: 2160 cu mm — ABNORMAL HIGH (ref 0–1000)

## 2019-11-13 LAB — LACTATE DEHYDROGENASE, PLEURAL OR PERITONEAL FLUID: LD, Fluid: 82 U/L — ABNORMAL HIGH (ref 3–23)

## 2019-11-13 LAB — GLUCOSE, CAPILLARY
Glucose-Capillary: 112 mg/dL — ABNORMAL HIGH (ref 70–99)
Glucose-Capillary: 212 mg/dL — ABNORMAL HIGH (ref 70–99)

## 2019-11-13 LAB — HEMOGLOBIN A1C
Hgb A1c MFr Bld: 9.5 % — ABNORMAL HIGH (ref 4.8–5.6)
Mean Plasma Glucose: 226 mg/dL

## 2019-11-13 LAB — PROTEIN, PLEURAL OR PERITONEAL FLUID: Total protein, fluid: 5.5 g/dL

## 2019-11-13 LAB — TROPONIN I (HIGH SENSITIVITY): Troponin I (High Sensitivity): 63 ng/L — ABNORMAL HIGH (ref <18)

## 2019-11-13 MED ORDER — CARVEDILOL 6.25 MG PO TABS
6.2500 mg | ORAL_TABLET | Freq: Two times a day (BID) | ORAL | Status: DC
  Administered 2019-11-13 – 2019-11-15 (×4): 6.25 mg via ORAL
  Filled 2019-11-13 (×2): qty 1
  Filled 2019-11-13: qty 2
  Filled 2019-11-13: qty 1

## 2019-11-13 MED ORDER — HEPARIN SODIUM (PORCINE) 5000 UNIT/ML IJ SOLN
5000.0000 [IU] | Freq: Three times a day (TID) | INTRAMUSCULAR | Status: DC
  Administered 2019-11-13 – 2019-11-17 (×12): 5000 [IU] via SUBCUTANEOUS
  Filled 2019-11-13 (×12): qty 1

## 2019-11-13 MED ORDER — ALBUTEROL SULFATE (2.5 MG/3ML) 0.083% IN NEBU: 2.5000 mg | INHALATION_SOLUTION | RESPIRATORY_TRACT | Status: DC | PRN

## 2019-11-13 MED ORDER — IOHEXOL 350 MG/ML SOLN
80.0000 mL | Freq: Once | INTRAVENOUS | Status: AC | PRN
  Administered 2019-11-13: 80 mL via INTRAVENOUS

## 2019-11-13 MED ORDER — INSULIN ASPART 100 UNIT/ML ~~LOC~~ SOLN
0.0000 [IU] | Freq: Three times a day (TID) | SUBCUTANEOUS | Status: DC
  Administered 2019-11-13: 1 [IU] via SUBCUTANEOUS
  Administered 2019-11-14: 2 [IU] via SUBCUTANEOUS
  Administered 2019-11-14 (×2): 1 [IU] via SUBCUTANEOUS
  Administered 2019-11-15: 3 [IU] via SUBCUTANEOUS
  Administered 2019-11-15 – 2019-11-16 (×3): 1 [IU] via SUBCUTANEOUS
  Administered 2019-11-17: 2 [IU] via SUBCUTANEOUS

## 2019-11-13 MED ORDER — ONDANSETRON HCL 4 MG/2ML IJ SOLN: 4.0000 mg | Freq: Four times a day (QID) | INTRAMUSCULAR | Status: DC | PRN

## 2019-11-13 MED ORDER — INSULIN ASPART 100 UNIT/ML ~~LOC~~ SOLN
0.0000 [IU] | Freq: Every day | SUBCUTANEOUS | Status: DC
  Administered 2019-11-13: 2 [IU] via SUBCUTANEOUS
  Administered 2019-11-16: 3 [IU] via SUBCUTANEOUS

## 2019-11-13 MED ORDER — ALBUTEROL SULFATE HFA 108 (90 BASE) MCG/ACT IN AERS
1.0000 | INHALATION_SPRAY | RESPIRATORY_TRACT | Status: DC | PRN
  Administered 2019-11-13: 1 via RESPIRATORY_TRACT
  Filled 2019-11-13: qty 6.7

## 2019-11-13 MED ORDER — SODIUM CHLORIDE 0.9 % IV SOLN
2.0000 g | Freq: Once | INTRAVENOUS | Status: AC
  Administered 2019-11-13: 2 g via INTRAVENOUS
  Filled 2019-11-13: qty 2

## 2019-11-13 MED ORDER — ISOSORB DINITRATE-HYDRALAZINE 20-37.5 MG PO TABS
1.0000 | ORAL_TABLET | Freq: Three times a day (TID) | ORAL | Status: DC
  Administered 2019-11-13 – 2019-11-17 (×10): 1 via ORAL
  Filled 2019-11-13 (×14): qty 1

## 2019-11-13 MED ORDER — LATANOPROST 0.005 % OP SOLN
1.0000 [drp] | Freq: Every day | OPHTHALMIC | Status: DC
  Administered 2019-11-13 – 2019-11-16 (×4): 1 [drp] via OPHTHALMIC
  Filled 2019-11-13: qty 2.5

## 2019-11-13 MED ORDER — ACETAMINOPHEN 325 MG PO TABS
650.0000 mg | ORAL_TABLET | Freq: Four times a day (QID) | ORAL | Status: DC | PRN
  Administered 2019-11-15: 650 mg via ORAL
  Filled 2019-11-13 (×2): qty 2

## 2019-11-13 MED ORDER — TIMOLOL HEMIHYDRATE 0.5 % OP SOLN: 1.0000 [drp] | Freq: Two times a day (BID) | OPHTHALMIC | Status: DC

## 2019-11-13 MED ORDER — LEVOTHYROXINE SODIUM 50 MCG PO TABS
50.0000 ug | ORAL_TABLET | Freq: Every day | ORAL | Status: DC
  Administered 2019-11-13 – 2019-11-17 (×5): 50 ug via ORAL
  Filled 2019-11-13 (×5): qty 1

## 2019-11-13 MED ORDER — LIDOCAINE HCL 1 % IJ SOLN
INTRAMUSCULAR | Status: AC
  Filled 2019-11-13: qty 20

## 2019-11-13 MED ORDER — VANCOMYCIN HCL 2000 MG/400ML IV SOLN
2000.0000 mg | INTRAVENOUS | Status: AC
  Administered 2019-11-13: 2000 mg via INTRAVENOUS
  Filled 2019-11-13: qty 400

## 2019-11-13 MED ORDER — TIMOLOL MALEATE 0.5 % OP SOLN
1.0000 [drp] | Freq: Two times a day (BID) | OPHTHALMIC | Status: DC
  Administered 2019-11-13 – 2019-11-17 (×8): 1 [drp] via OPHTHALMIC
  Filled 2019-11-13: qty 5

## 2019-11-13 MED ORDER — LEVETIRACETAM 500 MG PO TABS
500.0000 mg | ORAL_TABLET | Freq: Every day | ORAL | Status: DC
  Administered 2019-11-13 – 2019-11-16 (×4): 500 mg via ORAL
  Filled 2019-11-13 (×4): qty 1

## 2019-11-13 MED ORDER — SODIUM CHLORIDE 0.9 % IV SOLN
2.0000 g | INTRAVENOUS | Status: AC
  Administered 2019-11-14 – 2019-11-16 (×2): 2 g via INTRAVENOUS
  Filled 2019-11-13 (×2): qty 2

## 2019-11-13 MED ORDER — VANCOMYCIN HCL IN DEXTROSE 1-5 GM/200ML-% IV SOLN: 1000.0000 mg | INTRAVENOUS | Status: AC

## 2019-11-13 MED ORDER — ONDANSETRON HCL 4 MG PO TABS: 4.0000 mg | ORAL_TABLET | Freq: Four times a day (QID) | ORAL | Status: DC | PRN

## 2019-11-13 MED ORDER — RENA-VITE PO TABS
1.0000 | ORAL_TABLET | Freq: Every day | ORAL | Status: DC
  Administered 2019-11-13 – 2019-11-16 (×4): 1 via ORAL
  Filled 2019-11-13 (×4): qty 1

## 2019-11-13 NOTE — ED Notes (Signed)
Pt transported to IR 

## 2019-11-13 NOTE — Consult Note (Addendum)
Renal Service Consult Note [facility]  Howard Bell 11/13/2019 [name] Requesting Physician: Dr Sloan Leiter, Raliegh Ip.   Reason for Consult:  ESRD pt w/ PNA HPI: The patient is a 54 y.o. year-old presenting w/ cough and syncopal event at home this past weekend. Pt on home O2 at 3L . Has COPD, CHF, HTN, DM and ESRD on HD MWF. Last HD was Friday, missed yesterday.  +N/V, some diarrhea, some CP assoc w/ coughing, not sure about fever. Got some abx about 1 mo ago for PNA. In ED CXR showed large R infiltrate/ effusion. CT showed large R effusion and complete consolidation of the RLL.  Pt was admitted and started on IV abx. This am IR did thoracentesis. Asked to see for dialysis.   Pt is confused and sleepy, not in distress, very poor historian.   ROS n/a   Past Medical History  Past Medical History:  Diagnosis Date  . Arthritis   . Congestive heart failure (CHF) ([place])   . Depression   . 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 Diabetic gastroparesis ([place]) 11/06/2017  . ESRD (end stage renal disease) on dialysis [facility])    M,W.F dialysis  . H/O seasonal allergies   . Hypertension   . Hypothyroidism   . Peripheral vascular disease ([place])   . Pneumonia   . Sleep apnea    not using it now, on continuous O2 2 L during the day, 3 L during the night   Past Surgical History  Past Surgical History:  Procedure Laterality Date  . AMPUTATION Right 05/14/2018   Procedure: AMPUTATION BELOW KNEE;  Surgeon: Newt Minion, MD;  Location: [place];  Service: Orthopedics;  Laterality: Right;  . AMPUTATION Right 08/04/2018   Procedure: RIGHT ABOVE KNEE AMPUTATION;  Surgeon: Newt Minion, MD;  Location: [facility];  Service: Orthopedics;  Laterality: Right;  . AMPUTATION Left 09/08/2018   Procedure: LEFT ABOVE KNEE AMPUTATION;  Surgeon: Newt Minion, MD;  Location: [place];  Service: Orthopedics;  Laterality: Left;  . APPLICATION OF WOUND VAC Left 09/08/2018    Procedure: Application Of Wound Vac;  Surgeon: Newt Minion, MD;  Location: [facility];  Service: Orthopedics;  Laterality: Left;  . ESOPHAGOGASTRODUODENOSCOPY (EGD) WITH PROPOFOL N/A 12/05/2017   Procedure: ESOPHAGOGASTRODUODENOSCOPY (EGD) WITH PROPOFOL;  Surgeon: Doran Stabler, MD;  [facility];  Service: Gastroenterology;  Laterality: N/A;  . HERNIA REPAIR    . IR AV DIALY SHUNT INTRO NEEDLE/INTRACATH INITIAL W/PTA/IMG LEFT  03/30/2017  . IR THORACENTESIS ASP PLEURAL SPACE W/IMG GUIDE  11/13/2019   Family History  Family History  Problem Relation Age of Onset  . Diabetes Mother   . Hypertension Father    Social History  reports that he has never smoked. He has never used smokeless tobacco. He reports that he does not drink alcohol and does not use drugs. Allergies  Allergies  Allergen Reactions  . Gabapentin Other (See Comments)    Altered mental status   Home medications Prior to Admission medications   Medication Sig Start Date End Date Taking? Authorizing Provider  albuterol (PROVENTIL HFA;VENTOLIN HFA) 108 (90 Base) MCG/ACT inhaler Inhale 1 puff into the lungs every 4 (four) hours as needed for wheezing or shortness of breath.    Yes [provider]  calcium acetate (PHOSLO) 667 MG capsule Take 2,668 mg by mouth 3 (three) times daily with meals.   Yes [provider]  carvedilol (COREG)  6.25 MG tablet Take 1 tablet (6.25 mg total) by mouth 2 (two) times daily with a meal. 08/19/19  Yes Thurnell Lose, MD  docusate sodium (COLACE) 100 MG capsule Take 1 capsule (100 mg total) by mouth 2 (two) times daily. Patient taking differently: Take 100 mg by mouth 2 (two) times daily as needed for mild constipation.  01/22/19  Yes Sagardia, Ines Bloomer, MD  fluticasone [facility]) 50 MCG/ACT nasal spray Place 1 spray into both nostrils daily as needed for allergies.    Yes [provider]  Insulin NPH, Human,, Isophane, (HUMULIN N KWIKPEN) 100 UNIT/ML Kiwkpen  Inject 14 Units into the skin every morning. And pen needles 1/day Patient taking differently: Inject 0-4 Units into the skin every morning. And pen needles 1/day 09/11/19  Yes Renato Shin, MD  isosorbide-hydrALAZINE (BIDIL) 20-37.5 MG tablet Take 1 tablet by mouth 3 (three) times daily. 08/19/19  Yes Thurnell Lose, MD  latanoprost (XALATAN) 0.005 % ophthalmic solution Place 1 drop into both eyes at bedtime.   Yes [provider]  levETIRAcetam (KEPPRA) 500 MG tablet Take 1 tablet (500 mg total) by mouth at bedtime. 08/19/19  Yes Thurnell Lose, MD  levothyroxine (SYNTHROID) 50 MCG tablet Take 1 tablet (50 mcg total) by mouth daily. 09/12/19  Yes Renato Shin, MD  metoCLOPramide (REGLAN) 5 MG tablet Take 1 tablet (5 mg total) by mouth 3 (three) times daily before meals. Patient taking differently: Take 5 mg by mouth 2 (two) times daily before a meal.  08/30/18  Yes Love, Ivan Anchors, PA-C  multivitamin (RENA-VIT) TABS tablet Take 1 tablet by mouth at bedtime. 01/22/19  Yes Sagardia, Ines Bloomer, MD  omeprazole (PRILOSEC) 20 MG capsule Take 1 capsule (20 mg total) by mouth at bedtime. 01/22/19  Yes Sagardia, Ines Bloomer, MD  timolol (BETIMOL) 0.5 % ophthalmic solution Place 1 drop into both eyes 2 (two) times daily. 05/07/17  Yes Ivar Drape D, PA  acetaminophen (TYLENOL) 325 MG tablet Take 1-2 tablets (325-650 mg total) by mouth every 4 (four) hours as needed for mild pain. Patient not taking: Reported on 11/13/2019 09/26/18   Love, Ivan Anchors, PA-C  ferric citrate (AURYXIA) 1 GM 210 MG(Fe) tablet Take 1 tablet (210 mg total) by mouth 3 (three) times daily with meals. Patient not taking: Reported on 10/24/2019 09/29/18   Bary Leriche, PA-C  glucose blood test strip 1 each by Other route as needed for other. Use as instructed 10/31/18   Horald Pollen, MD  Lancets [facility]) lancets Use as instructed 10/31/18   Horald Pollen, MD     Vitals:   11/13/19 0925  11/13/19 1015 11/13/19 1215 11/13/19 1220  BP: 122/84 (!) 138/97 99/64   Pulse: 85 87 81   Resp:   20   Temp:    97.9 F (36.6 C)  TempSrc:    Oral  SpO2: 99% 95% 93%   Weight:      Height:       Exam Gen very groggy, awakens briefly and falls back asleep, no apnea , no distress, on 5-6 L  O2 No rash, cyanosis or gangrene Sclera anicteric, throat clear  No jvd or bruits Chest dec'd R base, L clear RRR no MRG Abd soft ntnd no mass or ascites +bs obese GU normal male MS bilat AKA Ext no stump or abd wall edema, no wounds or ulcers Neuro is as above, follows simple commands then falls back asleep NO asterixis, no  myoclonic jerking   Home meds:  - coreg 6.25 bid/ bidil 1 tid  - synthroid  50 ug/   - auryxia ac tid  - insulin NPH 0-4 u sq qam  - reglan 5mg  bid/ prilosec 20 hs  - keppra 500 hs  - eyedrops/ prn's/ vitamins/ supplements      OP HD: [facility] MWF, missed 8/23    4h 8min 89kg  2/2.25 bath  LUA AVF  Hep 6000/ 3000 midrun  L AVF   - last mircera 200 ug on 8/10  - venofoer 60 mg q wk  - no vit D   Assessment/ Plan: 1. Cough/ SOB/ RLL PNA / pleural effusion - on IV abx per primary team, sp R thora this am per IR.  2. ESRD - MWF HD.  Missed yesterday. Creat 12, K 4.8.  Will plan HD in am 1st shift, no urgent need for today.  3. HTN - BP's soft to normal, up 3kg by bed wt. No edema on exam. UF 2L as tol w/ HD.  4. IDDM - per pmd 5. Anemia ckd - hb 16, hold esa 6. 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

## 2019-11-13 NOTE — Progress Notes (Signed)
Pharmacy Antibiotic Note  Zacharias Ridling is a 54 y.o. male admitted on 11/12/2019 with pneumonia.  Pharmacy has been consulted for vancomycin and cefepime dosing.  Plan: Vancomycin 2000mg  x1 then 1000mg  IV every HD.  Goal pre-HD level 15-25 mcg/mL. Cefepime 2g IV every HD.  Height: 6' (182.9 cm) Weight: 107 kg (235 lb 14.3 oz) IBW/kg (Calculated) : 77.6  Temp (24hrs), Avg:97.8 F (36.6 C), Min:97.7 F (36.5 C), Max:98.1 F (36.7 C)  Recent Labs  Lab 11/07/19 2018 11/12/19 1114  WBC 5.0 6.0  CREATININE 6.91* 11.55*    Estimated Creatinine Clearance: 9.5 mL/min (A) (by C-G formula based on SCr of 11.55 mg/dL (H)).    Allergies  Allergen Reactions  . Gabapentin Other (See Comments)    Altered mental status     Thank you for allowing pharmacy to be a part of this patient's care.  Wynona Neat, PharmD, BCPS  11/13/2019 3:06 AM

## 2019-11-13 NOTE — H&P (Signed)
History and Physical    Howard Bell POE:[id number] DOB: [birth year] DOA: 11/12/2019  PCP: Horald Pollen, MD   Patient coming from: Home  Chief Complaint: Cough, syncope after coughing spell  HPI: Howard Bell is a 54 y.o. male with medical history significant for diabetes mellitus, hypertension, CHF, ESRD dialyzing Monday/Wednesday/Friday via left upper extremity aVF presented to the ED today complaining of cough.  Reports his last hemodialysis session was on Friday.  Did not have hemodialysis today secondary to not feeling well. Patient states that he has had a persistent and progressively worsening cough over the last 2 weeks.  He reports he has also had some nausea and vomiting that is worsened with his cough.  He does report having some diarrhea over the last week but he was also taking stool softeners. Both yesterday and today he endorses syncope in the setting of a coughing spell both which are brief with return to baseline shortly after the episode with no residual confusion.  He reports no seizure activity.  He states he has never had syncopal episodes in the past.  He has had some nonradiating chest pain that occurs with coughing.  He reports no sputum production with his cough.  He states he has not had any fever.  He reports he did have a course of antibiotic therapy approximately a month ago for pneumonia.  He reports his blood sugars have been controlled running in the 120-180 range.  Does not report taking any over-the-counter cough or cold medications at home.  ED Course: Patient was found to have a large right pleural effusion with consolidation of right lower lobe.  He had a paroxysmal hard coughing spell that was witnessed by the ER provider and patient had brief syncopal episode after the coughing episode.  Also advanced to admit for further management  Review of Systems:  General: Denies weakness, fever, chills, weight loss, night sweats.  Denies dizziness.   Denies change in appetite HENT: Denies head trauma, headache, denies change in hearing, tinnitus.  Denies nasal congestion or bleeding.  Denies sore throat, sores in mouth.  Denies difficulty swallowing Eyes: Denies blurry vision, pain in eye, drainage.  Denies discoloration of eyes. Neck: Denies pain.  Denies swelling.  Denies pain with movement. Cardiovascular: Denies chest pain, palpitations.  Denies edema.  Denies orthopnea Respiratory: Denies shortness of breath, cough.  Denies wheezing.  Denies sputum production Gastrointestinal: Denies abdominal pain, swelling.  Denies nausea, vomiting, diarrhea.  Denies melena.  Denies hematemesis. Musculoskeletal: Denies limitation of movement.  Denies deformity or swelling.  Denies pain.  Denies arthralgias or myalgias. Genitourinary: Denies pelvic pain.  Denies urinary frequency or hesitancy.  Denies dysuria.  Skin: Denies rash.  Denies petechiae, purpura, ecchymosis. Neurological: Denies headache.  Denies syncope.  Denies seizure activity.  Denies weakness or paresthesia.  Denies slurred speech, drooping face.  Denies visual change. Psychiatric: Denies depression, anxiety.  Denies suicidal thoughts or ideation.  Denies hallucinations.  Past Medical History:  Diagnosis Date  . Arthritis   . Congestive heart failure (CHF) ([place])   . Depression   . 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 Diabetic gastroparesis ([place]) 11/06/2017  . ESRD (end stage renal disease) on dialysis [facility])    M,W.F dialysis  . H/O seasonal allergies   . Hypertension   . Hypothyroidism   . Peripheral vascular disease ([place])   . Pneumonia   . Sleep apnea    not using it now, on continuous O2  2 L during the day, 3 L during the night    Past Surgical History:  Procedure Laterality Date  . AMPUTATION Right 05/14/2018   Procedure: AMPUTATION BELOW KNEE;  Surgeon: Newt Minion, MD;  Location: [place];  Service: Orthopedics;  Laterality: Right;  .  AMPUTATION Right 08/04/2018   Procedure: RIGHT ABOVE KNEE AMPUTATION;  Surgeon: Newt Minion, MD;  Location: [place];  Service: Orthopedics;  Laterality: Right;  . AMPUTATION Left 09/08/2018   Procedure: LEFT ABOVE KNEE AMPUTATION;  Surgeon: Newt Minion, MD;  Location: [place];  Service: Orthopedics;  Laterality: Left;  . APPLICATION OF WOUND VAC Left 09/08/2018   Procedure: Application Of Wound Vac;  Surgeon: Newt Minion, MD;  Location: [place];  Service: Orthopedics;  Laterality: Left;  . ESOPHAGOGASTRODUODENOSCOPY (EGD) WITH PROPOFOL N/A 12/05/2017   Procedure: ESOPHAGOGASTRODUODENOSCOPY (EGD) WITH PROPOFOL;  Surgeon: Doran Stabler, MD;  Location: [facility];  Service: Gastroenterology;  Laterality: N/A;  . HERNIA REPAIR    . IR AV DIALY SHUNT INTRO NEEDLE/INTRACATH INITIAL W/PTA/IMG LEFT  03/30/2017    Social History  reports that he has never smoked. He has never used smokeless tobacco. He reports that he does not drink alcohol and does not use drugs.  Allergies  Allergen Reactions  . Gabapentin Other (See Comments)    Altered mental status    Family History  Problem Relation Age of Onset  . Diabetes Mother   . Hypertension Father      Prior to Admission medications   Medication Sig Start Date End Date Taking? Authorizing Provider  acetaminophen (TYLENOL) 325 MG tablet Take 1-2 tablets (325-650 mg total) by mouth every 4 (four) hours as needed for mild pain. 09/26/18   Love, Ivan Anchors, PA-C  albuterol (PROVENTIL HFA;VENTOLIN HFA) 108 (90 Base) MCG/ACT inhaler Inhale 1 puff into the lungs every 4 (four) hours as needed for wheezing or shortness of breath.     [provider]  calcium acetate (PHOSLO) 667 MG capsule Take 2,668 mg by mouth 3 (three) times daily with meals.    [provider]  carvedilol (COREG) 6.25 MG tablet Take 1 tablet (6.25 mg total) by mouth 2 (two) times daily with a meal. 08/19/19   Thurnell Lose, MD  docusate sodium (COLACE) 100 MG  capsule Take 1 capsule (100 mg total) by mouth 2 (two) times daily. Patient taking differently: Take 100 mg by mouth 2 (two) times daily as needed for mild constipation.  01/22/19   Horald Pollen, MD  ferric citrate (AURYXIA) 1 GM 210 MG(Fe) tablet Take 1 tablet (210 mg total) by mouth 3 (three) times daily with meals. Patient not taking: Reported on 10/24/2019 09/29/18   Love, Ivan Anchors, PA-C  fluticasone [facility]) 50 MCG/ACT nasal spray Place 1 spray into both nostrils daily as needed for allergies.     [provider]  glucose blood test strip 1 each by Other route as needed for other. Use as instructed 10/31/18   Horald Pollen, MD  Insulin NPH, Human,, Isophane, (HUMULIN N KWIKPEN) 100 UNIT/ML Kiwkpen Inject 14 Units into the skin every morning. And pen needles 1/day Patient taking differently: Inject 0-4 Units into the skin every morning. And pen needles 1/day 09/11/19   Renato Shin, MD  isosorbide-hydrALAZINE (BIDIL) 20-37.5 MG tablet Take 1 tablet by mouth 3 (three) times daily. 08/19/19   Thurnell Lose, MD  Lancets (ACCU-CHEK SOFT [facility]) lancets Use as instructed 10/31/18   Sagardia,  Ines Bloomer, MD  latanoprost (XALATAN) 0.005 % ophthalmic solution Place 1 drop into both eyes at bedtime.    [provider]  levETIRAcetam (KEPPRA) 500 MG tablet Take 1 tablet (500 mg total) by mouth at bedtime. 08/19/19   Thurnell Lose, MD  levothyroxine (SYNTHROID) 50 MCG tablet Take 1 tablet (50 mcg total) by mouth daily. 09/12/19   Renato Shin, MD  metoCLOPramide (REGLAN) 5 MG tablet Take 1 tablet (5 mg total) by mouth 3 (three) times daily before meals. Patient taking differently: Take 5 mg by mouth 2 (two) times daily before a meal.  08/30/18   Love, Ivan Anchors, PA-C  multivitamin (RENA-VIT) TABS tablet Take 1 tablet by mouth at bedtime. 01/22/19   Horald Pollen, MD  omeprazole (PRILOSEC) 20 MG capsule Take 1 capsule (20 mg total) by mouth at bedtime. 01/22/19    Horald Pollen, MD  timolol (BETIMOL) 0.5 % ophthalmic solution Place 1 drop into both eyes 2 (two) times daily. 05/07/17   Ivar Drape D, PA    Physical Exam: Vitals:   11/12/19 1421 11/12/19 1538 11/12/19 1651 11/12/19 1840  BP: (!) 139/99 135/85 128/87 (!) 119/98  Pulse: 88 86 88 87  Resp: 18 16 13 18   Temp:  97.7 F (36.5 C) 97.8 F (36.6 C)   TempSrc:  Oral Oral   SpO2: 97% 91% 94% 96%    Constitutional: NAD, calm, comfortable Vitals:   11/12/19 1421 11/12/19 1538 11/12/19 1651 11/12/19 1840  BP: (!) 139/99 135/85 128/87 (!) 119/98  Pulse: 88 86 88 87  Resp: 18 16 13 18   Temp:  97.7 F (36.5 C) 97.8 F (36.6 C)   TempSrc:  Oral Oral   SpO2: 97% 91% 94% 96%   General: WDWN, Alert and oriented x3.  Eyes: EOMI, PERRL, lids and conjunctivae normal.  Sclera nonicteric HENT:  Energy/AT, external ears normal.  Nares patent without epistasis. Mucous membranes are moist. Posterior pharynx clear of any exudate or lesions.  Neck: Soft, normal range of motion, supple, no masses, no thyromegaly.  Trachea midline Respiratory: Decreased breath sounds in the right lower lung. Mild crackles in left lung field. No wheezing. Normal respiratory effort. No accessory muscle use.  Cardiovascular: Regular rate and rhythm, no murmurs / rubs / gallops. No extremity edema.  Radial pulses +1 bilaterally.  Left bicep with palpable thrill at fistula site.  no carotid bruits.  Abdomen: Soft, no tenderness, nondistended, no rebound or guarding.  No masses palpated.  Bowel sounds normoactive Musculoskeletal: FROM of upper extremities.  Bilateral AKA's.. no cyanosis. No joint deformity. no contractures. Normal muscle tone.  Skin: Warm, dry, intact no rashes, lesions, ulcers. No induration Neurologic: CN 2-12 grossly intact.  Normal speech.  Sensation intact, patella DTR +1 bilaterally. Strength 5/5 in upper extremities.   Psychiatric: Normal judgment and insight.  Normal mood.    Labs on  Admission: I have personally reviewed following labs and imaging studies  CBC: Recent Labs  Lab 11/07/19 2018 11/12/19 1114  WBC 5.0 6.0  HGB 15.9 16.6  HCT 49.5 51.8  MCV 95.0 94.7  PLT 121* 120*    Basic Metabolic Panel: Recent Labs  Lab 11/07/19 2018 11/12/19 1114 11/12/19 2037  NA 134* 135  --   K 3.7 4.6  --   CL 90* 91*  --   CO2 31 25  --   GLUCOSE 215* 125*  --   BUN 17 39*  --   CREATININE 6.91* 11.55*  --  CALCIUM 9.6 10.1  --   MG  --   --  2.6*  PHOS  --   --  9.1*    GFR: CrCl cannot be calculated (Unknown ideal weight.).  Liver Function Tests: No results for input(s): AST, ALT, ALKPHOS, BILITOT, PROT, ALBUMIN in the last 168 hours.  Urine analysis:    Component Value Date/Time   COLORURINE RED (A) 11/19/2016 0020   APPEARANCEUR TURBID (A) 11/19/2016 0020   LABSPEC  11/19/2016 0020    TEST NOT REPORTED DUE TO COLOR INTERFERENCE OF URINE PIGMENT   PHURINE  11/19/2016 0020    TEST NOT REPORTED DUE TO COLOR INTERFERENCE OF URINE PIGMENT   GLUCOSEU (A) 11/19/2016 0020    TEST NOT REPORTED DUE TO COLOR INTERFERENCE OF URINE PIGMENT   HGBUR (A) 11/19/2016 0020    TEST NOT REPORTED DUE TO COLOR INTERFERENCE OF URINE PIGMENT   BILIRUBINUR (A) 11/19/2016 0020    TEST NOT REPORTED DUE TO COLOR INTERFERENCE OF URINE PIGMENT   KETONESUR (A) 11/19/2016 0020    TEST NOT REPORTED DUE TO COLOR INTERFERENCE OF URINE PIGMENT   PROTEINUR (A) 11/19/2016 0020    TEST NOT REPORTED DUE TO COLOR INTERFERENCE OF URINE PIGMENT   NITRITE (A) 11/19/2016 0020    TEST NOT REPORTED DUE TO COLOR INTERFERENCE OF URINE PIGMENT   LEUKOCYTESUR (A) 11/19/2016 0020    TEST NOT REPORTED DUE TO COLOR INTERFERENCE OF URINE PIGMENT    Radiological Exams on Admission: CT Angio Chest PE W and/or Wo Contrast  Result Date: 11/13/2019 CLINICAL DATA:  54 year old male with chest pain. Concern for pulmonary embolism. EXAM: CT ANGIOGRAPHY CHEST WITH CONTRAST TECHNIQUE: Multidetector CT  imaging of the chest was performed using the standard protocol during bolus administration of intravenous contrast. Multiplanar CT image reconstructions and MIPs were obtained to evaluate the vascular anatomy. CONTRAST:  27mL OMNIPAQUE IOHEXOL 350 MG/ML SOLN COMPARISON:  Chest CT dated 08/16/2019. FINDINGS: Cardiovascular: There is mild cardiomegaly. No pericardial effusion. Advanced 3 vessel coronary vascular calcification. There is mild atherosclerotic calcification of the thoracic aorta. No aneurysmal dilatation. The origins of the great vessels of the aortic arch appear patent. Evaluation of the pulmonary arteries is limited due to suboptimal opacification. No pulmonary artery embolus identified. 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 Mediastinum/Nodes: Evaluation of the hilar lymph nodes is limited due to consolidative changes of the lungs. Subcarinal adenopathy measures 2 cm. Right paratracheal adenopathy measures 15 mm in short axis. Mildly enlarged lymph node in the prevascular space measures 12 mm short axis, slightly increased since the prior CT. The esophagus is poorly visualized but grossly unremarkable. No mediastinal fluid collection. Lungs/Pleura: There is a large right pleural effusion, new since the prior CT. There is complete consolidation of the right lower lobe which may represent atelectasis or infiltrate. There are large areas of consolidation involving the right upper and right middle lobes. Diffuse hazy airspace density throughout the left lung with left lung base streaky density and interstitial prominence. There is no pneumothorax. The central airways are patent. Upper Abdomen: Advanced vascular calcification. Musculoskeletal: No acute osseous pathology. Extensive vascular collaterals noted within the chest wall. Review of the MIP images confirms the above findings. IMPRESSION: 1. No CT evidence of pulmonary embolism. 2. Large right pleural effusion, new  since the prior CT. 3. Complete consolidation of the right lower lobe as well as large areas of consolidation involving the right upper and right middle lobes most consistent with pneumonia.  Clinical correlation and follow-up to resolution recommended. 4. Diffuse left lung haziness and lower lobe streaky densities may represent atelectasis or edema. 5. Mild cardiomegaly with advanced 3 vessel coronary vascular calcification. 6. 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 7. Aortic Atherosclerosis (ICD10-I70.0). Electronically Signed   By: Anner Crete M.D.   On: 11/13/2019 01:22   DG Chest Port 1 View  Result Date: 11/12/2019 CLINICAL DATA:  Cough, syncope EXAM: PORTABLE CHEST 1 VIEW COMPARISON:  11/07/2019 FINDINGS: Large right pleural effusion is present, increased in size since prior examination, with marked compressive atelectasis of the right lung. Left lung is clear. No pneumothorax. No pleural effusion on the left. Cardiac size is mildly enlarged, unchanged. Multiple vascular stents are again seen within the expected central veins of the left upper extremity. No acute bone abnormality. IMPRESSION: Large right pleural effusion, increased in size since prior examination, with marked compressive atelectasis of the right lung. Electronically Signed   By: Fidela Salisbury MD   On: 11/12/2019 21:18    EKG: Independently reviewed.  EKG shows normal sinus rhythm with first AV block.  QTc is 457  Assessment/Plan Principal Problem:   Pleural effusion Howard Bell has a large right sided pleural effusion with complete consolidation of right lower lobe.  Interventional radiology will be consulted in the morning for thoracentesis to drain pleural effusion and have lab studies performed. Incentive spirometer every 2 hours while awake.  Supple oxygen as needed keep O2 sat between 92 to 96%  Active Problems:   Community  acquired pneumonia Patient started on cefepime and vancomycin for antibiotic coverage.  Patient was treated with antibiotics recently states for pneumonia    ESRD on dialysis [facility]) Neurology be consulted in the morning for evaluation and for hemodialysis    Type 2 diabetes mellitus with ESRD (end-stage renal disease) ([place]) Check hemoglobin A1c.  Blood sugars were monitored before every meal and nightly.  SSI as needed for glycemic control    S/P BKA (below knee amputation), right (HCC) Chronic     DVT prophylaxis: Heparin for DVT prophylaxis. Code Status:   Full code Family Communication:  Diagnosed plan discussed with patient.  Patient verbalized understanding agrees with plan.  Further recommendations to follow as clinical indicated Disposition Plan:   Patient is from:  Home  Anticipated DC to:  Home  Anticipated DC date:  Anticipate discharging greater than 2 midnights to treat medical condition  Anticipated DC barriers: No barriers to discharge identified at this time  Consults called:  Nephrology, interventional radiology Admission status:  Inpatient  Severity of Illness: The appropriate patient status for this patient is INPATIENT. Inpatient status is judged to be reasonable and necessary in order to provide the required intensity of service to ensure the patient's safety. The patient's presenting symptoms, physical exam findings, and initial radiographic and laboratory data in the context of their chronic comorbidities is felt to place them at high risk for further clinical deterioration. Furthermore, it is not anticipated that the patient will be medically stable for discharge from the hospital within 2 midnights of admission. The following factors support the patient status of inpatient   * I certify that at the point of admission it is my clinical judgment that the patient will require inpatient hospital care spanning beyond 2 midnights from the point of admission due to high  intensity of service, high risk for further deterioration and high frequency of surveillance required.*  Yevonne Aline Martin Smeal MD [facility]  How to contact the [facility] Attending or Consulting provider [place] or covering provider during after hours [place], for this patient?   1. Check the care team in [facility] and look for a) attending/consulting [facility] provider listed and b) the [facility] team listed 2. Log into [email or web address] and use [place]'s universal password to access. If you do not have the password, please contact the hospital operator. 3. Locate the [facility] provider you are looking for under [facility] and page to a number that you can be directly reached. 4. If you still have difficulty reaching the provider, please page the [facility] (Director on Call) for the Hospitalists listed on amion for assistance.  11/13/2019, 2:58 AM

## 2019-11-13 NOTE — Progress Notes (Addendum)
Admitted early morning hours by nighttime hospitalist.  54 year old male with diabetes, hypertension, chronic systolic heart failure, ESRD on hemodialysis Monday Wednesday Friday presented to ER with complaint of cough.  Patient was in the hospital last month and was treated for right lower lobe pneumonia.  He is having persistent and progressive worsening cough over last 2 weeks.  Also with nausea and vomiting.  CTA was done shows large right pleural effusion with consolidation of the right upper lobe and lower lobe.  Ongoing pulmonary symptoms.  Afebrile and without leukocytosis.  Right complicated pleural effusion/suspect pneumonia with parapneumonic effusion -Antibiotics, respiratory therapy, chest physiotherapy -Thoracentesis with labs and cultures.  ESRD on hemodialysis: Monday Wednesday Friday schedule.  Will notify his nephrologist.  Addendum, patient went thoracentesis.  1.6 L clearly following fluid drained.  Transudate on initial examination.  Continue antibiotics until cultures reported.  Probably will improve with conservative management.

## 2019-11-13 NOTE — Procedures (Signed)
PROCEDURE SUMMARY:  Successful US guided right thoracentesis. Yielded 1.6 L of milky brown fluid. Pt tolerated procedure well. No immediate complications.  Specimen sent for labs. CXR ordered; no post-procedure pneumothorax   EBL < 5 mL  Theresa Duty, NP 11/13/2019 11:45 AM

## 2019-11-14 ENCOUNTER — Ambulatory Visit: Payer: Medicare Other | Admitting: Emergency Medicine

## 2019-11-14 LAB — GLUCOSE, CAPILLARY
Glucose-Capillary: 134 mg/dL — ABNORMAL HIGH (ref 70–99)
Glucose-Capillary: 145 mg/dL — ABNORMAL HIGH (ref 70–99)
Glucose-Capillary: 199 mg/dL — ABNORMAL HIGH (ref 70–99)
Glucose-Capillary: 154 mg/dL — ABNORMAL HIGH (ref 70–99)

## 2019-11-14 LAB — CBC
HCT: 46 % (ref 39.0–52.0)
Hemoglobin: 15 g/dL (ref 13.0–17.0)
MCH: 30.6 pg (ref 26.0–34.0)
MCHC: 32.6 g/dL (ref 30.0–36.0)
MCV: 93.9 fL (ref 80.0–100.0)
Platelets: 117 10*3/uL — ABNORMAL LOW (ref 150–400)
RBC: 4.9 MIL/uL (ref 4.22–5.81)
RDW: 16.6 % — ABNORMAL HIGH (ref 11.5–15.5)
WBC: 6 10*3/uL (ref 4.0–10.5)
nRBC: 0.3 % — ABNORMAL HIGH (ref 0.0–0.2)

## 2019-11-14 LAB — RENAL FUNCTION PANEL
Albumin: 2.9 g/dL — ABNORMAL LOW (ref 3.5–5.0)
Anion gap: 18 — ABNORMAL HIGH (ref 5–15)
BUN: 65 mg/dL — ABNORMAL HIGH (ref 6–20)
CO2: 23 mmol/L (ref 22–32)
Calcium: 8.3 mg/dL — ABNORMAL LOW (ref 8.9–10.3)
Chloride: 88 mmol/L — ABNORMAL LOW (ref 98–111)
Creatinine, Ser: 13.78 mg/dL — ABNORMAL HIGH (ref 0.61–1.24)
GFR calc Af Amer: 4 mL/min — ABNORMAL LOW (ref >60)
GFR calc non Af Amer: 4 mL/min — ABNORMAL LOW (ref >60)
Glucose, Bld: 160 mg/dL — ABNORMAL HIGH (ref 70–99)
Phosphorus: 9.5 mg/dL — ABNORMAL HIGH (ref 2.5–4.6)
Potassium: 4.6 mmol/L (ref 3.5–5.1)
Sodium: 129 mmol/L — ABNORMAL LOW (ref 135–145)

## 2019-11-14 LAB — PH, BODY FLUID: pH, Body Fluid: 7.4

## 2019-11-14 LAB — CYTOLOGY - NON PAP

## 2019-11-14 MED ORDER — CALCIUM ACETATE (PHOS BINDER) 667 MG PO CAPS
2668.0000 mg | ORAL_CAPSULE | Freq: Three times a day (TID) | ORAL | Status: DC
  Administered 2019-11-14 – 2019-11-17 (×7): 2668 mg via ORAL
  Filled 2019-11-14 (×6): qty 4

## 2019-11-14 MED ORDER — VANCOMYCIN HCL IN DEXTROSE 1-5 GM/200ML-% IV SOLN
INTRAVENOUS | Status: AC
  Administered 2019-11-14: 1000 mg via INTRAVENOUS
  Filled 2019-11-14: qty 200

## 2019-11-14 MED ORDER — PANTOPRAZOLE SODIUM 40 MG PO TBEC
40.0000 mg | DELAYED_RELEASE_TABLET | Freq: Every day | ORAL | Status: DC
  Administered 2019-11-14 – 2019-11-17 (×4): 40 mg via ORAL
  Filled 2019-11-14 (×4): qty 1

## 2019-11-14 MED ORDER — HEPARIN SODIUM (PORCINE) 1000 UNIT/ML IJ SOLN
INTRAMUSCULAR | Status: AC
  Administered 2019-11-14: 1000 [IU]
  Filled 2019-11-14: qty 1

## 2019-11-14 MED ORDER — FLUTICASONE PROPIONATE 50 MCG/ACT NA SUSP
1.0000 | Freq: Every day | NASAL | Status: DC | PRN
  Filled 2019-11-14: qty 16

## 2019-11-14 MED ORDER — MELATONIN 3 MG PO TABS
3.0000 mg | ORAL_TABLET | Freq: Every day | ORAL | Status: DC
  Administered 2019-11-14 – 2019-11-16 (×3): 3 mg via ORAL
  Filled 2019-11-14 (×5): qty 1

## 2019-11-14 MED ORDER — METOCLOPRAMIDE HCL 5 MG PO TABS
5.0000 mg | ORAL_TABLET | Freq: Two times a day (BID) | ORAL | Status: DC
  Administered 2019-11-14 – 2019-11-17 (×5): 5 mg via ORAL
  Filled 2019-11-14 (×5): qty 1

## 2019-11-14 NOTE — Progress Notes (Signed)
Progress Note    Howard Bell  POE:[id number] DOB: 01-04-1966  DOA: 11/12/2019 PCP: Horald Pollen, MD    Brief Narrative:     Medical records reviewed and are as summarized below:  Howard Bell is an 54 y.o. male with medical history significant for diabetes mellitus, hypertension, CHF, ESRD dialyzing Monday/Wednesday/Friday via left upper extremity aVF presented to the ED today complaining of cough.  Reports his last hemodialysis session was on Friday.  Did not have hemodialysis today secondary to not feeling well.Patient states that he has had a persistent and progressively worsening cough over the last 2 weeks.  He reports he has also had some nausea and vomiting that is worsened with his cough.  He does report having some diarrhea over the last week but he was also taking stool softeners.Both yesterday and today he endorses syncope in the setting of a coughing spell both which are brief with return to baseline shortly after the episode with no residual confusion.  Assessment/Plan:   Principal Problem:   Pleural effusion Active Problems:   ESRD on dialysis ([place])   S/P BKA (below knee amputation), right ([place])   Community acquired pneumonia   Type 2 diabetes mellitus with ESRD (end-stage renal disease) (H[facility])   Pleural effusion -a large right sided pleural effusion with complete consolidation of right lower lobe.  -s/p thoracentesis with 1.6L removed, appears transudative, cultures negative - Supple oxygen as needed keep O2 sat between 92 to 96% -Weight 99 kg to 107 over last few weeks- so hopefully with HD his breathing will continue to improve  Chronic resp failure -wears 3L O2 at home    Community acquired pneumonia Patient started on cefepime and vancomycin for antibiotic coverage.  Patient was treated with antibiotics recently states for pneumonia -plan to de-escalate if cultures remain negative    ESRD on dialysis [facility]) -nephrology consulted     Type 2 diabetes mellitus with ESRD (end-stage renal disease) ([place]) SSI    S/P BKA (below knee amputation), right ([place])    Family Communication/Anticipated D/C date and plan/Code Status   DVT prophylaxis: heparin Code Status: Full Code.  Disposition Plan: Status is: Inpatient  Remains inpatient appropriate because:Inpatient level of care appropriate due to severity of illness   Dispo: The patient is from: Home              Anticipated d/c is to: Home              Anticipated d/c date is: 1 day              Patient currently is not medically stable to d/c.         Medical Consultants:    renal     Subjective:   Wears 3L O2 at home  Objective:    Vitals:   11/14/19 1030 11/14/19 1100 11/14/19 1130 11/14/19 1200  BP: (!) 124/98 97/69 110/60 106/88  Pulse: 87 88 85 85  Resp: 18 18 18 18   Temp:      TempSrc:      SpO2:      Weight:      Height:        Intake/Output Summary (Last 24 hours) at 11/14/2019 1310 Last data filed at 11/14/2019 0300 Gross per 24 hour  Intake 240 ml  Output --  Net 240 ml   Filed Weights   11/13/19 0300 11/14/19 0553 11/14/19 0811  Weight: 107 kg 91.9 kg 98.5 kg    Exam:  General: Appearance:     Overweight male in no acute distress     Lungs:     Diminished at bases, on [place] 4L, respirations unlabored  Heart:    Normal heart rate. Normal rhythm. No murmurs, rubs, or gallops.   MS:   Above knee amputation of left lower extremity is noted. Above knee amputation of right lower extremity is noted.   Neurologic:   Flat affect, poor eye contact     Data Reviewed:   I have personally reviewed following labs and imaging studies:  Labs: Labs show the following:   Basic Metabolic Panel: Recent Labs  Lab 11/07/19 2018 11/07/19 2018 11/12/19 1114 11/12/19 1114 11/12/19 2037 11/13/19 0404 11/14/19 0856  NA 134*  --  135  --   --  134* 129*  K 3.7   < > 4.6   < >  --  4.8 4.6  CL 90*  --  91*  --   --  89* 88*  CO2  31  --  25  --   --  21* 23  GLUCOSE 215*  --  125*  --   --  59* 160*  BUN 17  --  39*  --   --  47* 65*  CREATININE 6.91*  --  11.55*  --   --  12.15* 13.78*  CALCIUM 9.6  --  10.1  --   --  9.9 8.3*  MG  --   --   --   --  2.6*  --   --   PHOS  --   --   --   --  9.1*  --  9.5*   < > = values in this interval not displayed.   GFR Estimated Creatinine Clearance: 7.6 mL/min (A) (by C-G formula based on SCr of 13.78 mg/dL (H)). Liver Function Tests: Recent Labs  Lab 11/14/19 0856  ALBUMIN 2.9*   No results for input(s): LIPASE, AMYLASE in the last 168 hours. No results for input(s): AMMONIA in the last 168 hours. Coagulation profile No results for input(s): INR, PROTIME in the last 168 hours.  CBC: Recent Labs  Lab 11/07/19 2018 11/12/19 1114 11/13/19 0404 11/14/19 0850  WBC 5.0 6.0 5.9 6.0  HGB 15.9 16.6 16.8 15.0  HCT 49.5 51.8 53.3* 46.0  MCV 95.0 94.7 96.0 93.9  PLT 121* 120* 120* 117*   Cardiac Enzymes: No results for input(s): CKTOTAL, CKMB, CKMBINDEX, TROPONINI in the last 168 hours. BNP (last 3 results) No results for input(s): PROBNP in the last 8760 hours. CBG: Recent Labs  Lab 11/13/19 0853 11/13/19 1403 11/13/19 2145 11/14/19 0602  GLUCAP 145* 112* 212* 134*   D-Dimer: No results for input(s): DDIMER in the last 72 hours. Hgb A1c: Recent Labs    11/13/19 0404  HGBA1C 9.5*   Lipid Profile: No results for input(s): CHOL, HDL, LDLCALC, TRIG, CHOLHDL, LDLDIRECT in the last 72 hours. Thyroid function studies: No results for input(s): TSH, T4TOTAL, T3FREE, THYROIDAB in the last 72 hours.  Invalid input(s): FREET3 Anemia work up: No results for input(s): VITAMINB12, FOLATE, FERRITIN, TIBC, IRON, RETICCTPCT in the last 72 hours. Sepsis Labs: Recent Labs  Lab 11/07/19 2018 11/12/19 1114 11/13/19 0404 11/14/19 0850  WBC 5.0 6.0 5.9 6.0    Microbiology Recent Results (from the past 240 hour(s))  SARS Coronavirus 2 by RT PCR (hospital order,  performed in [facility] hospital lab) Nasopharyngeal Nasopharyngeal Swab     Status: None   Collection Time:  11/07/19  8:19 PM   Specimen: Nasopharyngeal Swab  Result Value Ref Range Status   SARS Coronavirus 2 NEGATIVE NEGATIVE Final    Comment: (NOTE) SARS-CoV-2 target nucleic acids are NOT DETECTED.  The SARS-CoV-2 RNA is generally detectable in upper and lower respiratory specimens during the acute phase of infection. The lowest concentration of SARS-CoV-2 viral copies this assay can detect is 250 copies / mL. A negative result does not preclude SARS-CoV-2 infection and should not be used as the sole basis for treatment or other patient management decisions.  A negative result may occur with improper specimen collection / handling, submission of specimen other than nasopharyngeal swab, presence of viral mutation(s) within the areas targeted by this assay, and inadequate number of viral copies (<250 copies / mL). A negative result must be combined with clinical observations, patient history, and epidemiological information.  Fact Sheet for Patients:   StrictlyIdeas.no  Fact Sheet for Healthcare Providers: BankingDealers.co.za  This test is not yet approved or  cleared by the [place] FDA and has been authorized for detection and/or diagnosis of SARS-CoV-2 by FDA under an Emergency Use Authorization (EUA).  This EUA will remain in effect (meaning this test can be used) for the duration of the COVID-19 declaration under Section 564(b)(1) of the Act, 21 U.S.C. section 360bbb-3(b)(1), unless the authorization is terminated or revoked sooner.  Performed at [facility], [place] [address], [place], [place] [address]   SARS Coronavirus 2 by RT PCR (hospital order, performed in [facility] hospital lab) Nasopharyngeal Nasopharyngeal Swab     Status: None   Collection Time: 11/12/19  8:37 PM   Specimen: Nasopharyngeal Swab    Result Value Ref Range Status   SARS Coronavirus 2 NEGATIVE NEGATIVE Final    Comment: (NOTE) SARS-CoV-2 target nucleic acids are NOT DETECTED.  The SARS-CoV-2 RNA is generally detectable in upper and lower respiratory specimens during the acute phase of infection. The lowest concentration of SARS-CoV-2 viral copies this assay can detect is 250 copies / mL. A negative result does not preclude SARS-CoV-2 infection and should not be used as the sole basis for treatment or other patient management decisions.  A negative result may occur with improper specimen collection / handling, submission of specimen other than nasopharyngeal swab, presence of viral mutation(s) within the areas targeted by this assay, and inadequate number of viral copies (<250 copies / mL). A negative result must be combined with clinical observations, patient history, and epidemiological information.  Fact Sheet for Patients:   StrictlyIdeas.no  Fact Sheet for Healthcare Providers: BankingDealers.co.za  This test is not yet approved or  cleared by the [place] FDA and has been authorized for detection and/or diagnosis of SARS-CoV-2 by FDA under an Emergency Use Authorization (EUA).  This EUA will remain in effect (meaning this test can be used) for the duration of the COVID-19 declaration under Section 564(b)(1) of the Act, 21 U.S.C. section 360bbb-3(b)(1), unless the authorization is terminated or revoked sooner.  Performed at [facility], [place] [address], [place], [place] [address]   Blood culture (routine x 2)     Status: None (Preliminary result)   Collection Time: 11/13/19  1:51 AM   Specimen: BLOOD  Result Value Ref Range Status   Specimen Description BLOOD SITE NOT SPECIFIED  Final   Special Requests   Final    BOTTLES DRAWN AEROBIC AND ANAEROBIC Blood Culture results may not be optimal due to an inadequate volume of blood received  in culture  bottles   Culture   Final    NO GROWTH 1 DAY Performed at [facility], [place] [address] [place], [place] [address]    Report Status PENDING  Incomplete  Blood culture (routine x 2)     Status: None (Preliminary result)   Collection Time: 11/13/19  4:04 AM   Specimen: BLOOD RIGHT HAND  Result Value Ref Range Status   Specimen Description BLOOD RIGHT HAND  Final   Special Requests   Final    BOTTLES DRAWN AEROBIC AND ANAEROBIC Blood Culture results may not be optimal due to an inadequate volume of blood received in culture bottles   Culture   Final    NO GROWTH 1 DAY Performed at [facility], [place] [address], [place], [place] [address]    Report Status PENDING  Incomplete  Body fluid culture     Status: None (Preliminary result)   Collection Time: 11/13/19 11:45 AM   Specimen: Lung, Right; Pleural Fluid  Result Value Ref Range Status   Specimen Description PLEURAL FLUID  Final   Special Requests RIGHT LUNG  Final   Gram Stain   Final    RARE WBC PRESENT,BOTH PMN AND MONONUCLEAR NO ORGANISMS SEEN    Culture   Final    NO GROWTH < 24 HOURS Performed at [facility], [place] [address], [place], [place] [address]    Report Status PENDING  Incomplete    Procedures and diagnostic studies:  DG Chest 1 View  Result Date: 11/13/2019 CLINICAL DATA:  Status post thoracentesis. EXAM: CHEST  1 VIEW COMPARISON:  November 12, 2019. FINDINGS: Stable cardiomegaly. No pneumothorax is noted. Left lung is clear. Right pleural effusion is significantly smaller status post thoracentesis. Right basilar atelectasis or infiltrate is noted. Bony thorax is unremarkable. IMPRESSION: Right pleural effusion is significantly smaller status post thoracentesis. Right basilar atelectasis or infiltrate is noted. No pneumothorax is noted. Electronically Signed   By: Marijo Conception M.D.   On: 11/13/2019 11:30   CT Angio Chest PE W and/or Wo Contrast  Result Date: 11/13/2019 CLINICAL DATA:   54 year old male with chest pain. Concern for pulmonary embolism. EXAM: CT ANGIOGRAPHY CHEST WITH CONTRAST TECHNIQUE: Multidetector CT imaging of the chest was performed using the standard protocol during bolus administration of intravenous contrast. Multiplanar CT image reconstructions and MIPs were obtained to evaluate the vascular anatomy. CONTRAST:  28mL OMNIPAQUE IOHEXOL 350 MG/ML SOLN COMPARISON:  Chest CT dated 08/16/2019. FINDINGS: Cardiovascular: There is mild cardiomegaly. No pericardial effusion. Advanced 3 vessel coronary vascular calcification. There is mild atherosclerotic calcification of the thoracic aorta. No aneurysmal dilatation. The origins of the great vessels of the aortic arch appear patent. Evaluation of the pulmonary arteries is limited due to suboptimal opacification. No pulmonary artery embolus identified. 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 Mediastinum/Nodes: Evaluation of the hilar lymph nodes is limited due to consolidative changes of the lungs. Subcarinal adenopathy measures 2 cm. Right paratracheal adenopathy measures 15 mm in short axis. Mildly enlarged lymph node in the prevascular space measures 12 mm short axis, slightly increased since the prior CT. The esophagus is poorly visualized but grossly unremarkable. No mediastinal fluid collection. Lungs/Pleura: There is a large right pleural effusion, new since the prior CT. There is complete consolidation of the right lower lobe which may represent atelectasis or infiltrate. There are large areas of consolidation involving the right upper and right middle lobes. Diffuse hazy  airspace density throughout the left lung with left lung base streaky density and interstitial prominence. There is no pneumothorax. The central airways are patent. Upper Abdomen: Advanced vascular calcification. Musculoskeletal: No acute osseous pathology. Extensive vascular collaterals noted within the chest wall. Review  of the MIP images confirms the above findings. IMPRESSION: 1. No CT evidence of pulmonary embolism. 2. Large right pleural effusion, new since the prior CT. 3. Complete consolidation of the right lower lobe as well as large areas of consolidation involving the right upper and right middle lobes most consistent with pneumonia. Clinical correlation and follow-up to resolution recommended. 4. Diffuse left lung haziness and lower lobe streaky densities may represent atelectasis or edema. 5. Mild cardiomegaly with advanced 3 vessel coronary vascular calcification. 6. 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 7. Aortic Atherosclerosis (ICD10-I70.0). Electronically Signed   By: Anner Crete M.D.   On: 11/13/2019 01:22   DG Chest Port 1 View  Result Date: 11/12/2019 CLINICAL DATA:  Cough, syncope EXAM: PORTABLE CHEST 1 VIEW COMPARISON:  11/07/2019 FINDINGS: Large right pleural effusion is present, increased in size since prior examination, with marked compressive atelectasis of the right lung. Left lung is clear. No pneumothorax. No pleural effusion on the left. Cardiac size is mildly enlarged, unchanged. Multiple vascular stents are again seen within the expected central veins of the left upper extremity. No acute bone abnormality. IMPRESSION: Large right pleural effusion, increased in size since prior examination, with marked compressive atelectasis of the right lung. Electronically Signed   By: Fidela Salisbury MD   On: 11/12/2019 21:18   IR THORACENTESIS ASP PLEURAL SPACE W/IMG GUIDE  Result Date: 11/13/2019 INDICATION: Patient with a history of right lower lobe pneumonia now with right pleural effusion. Interventional radiology asked to perform a therapeutic and diagnostic thoracentesis. EXAM: ULTRASOUND GUIDED RIGHT THORACENTESIS MEDICATIONS: 1% lidocaine 10 mL COMPLICATIONS: None immediate. PROCEDURE: An ultrasound guided  thoracentesis was thoroughly discussed with the patient and questions answered. The benefits, risks, alternatives and complications were also discussed. The patient understands and wishes to proceed with the procedure. Written consent was obtained. Ultrasound was performed to localize and mark an adequate pocket of fluid in the right chest. The area was then prepped and draped in the normal sterile fashion. 1% Lidocaine was used for local anesthesia. Under ultrasound guidance a 6 Fr Safe-T-Centesis catheter was introduced. Thoracentesis was performed. The catheter was removed and a dressing applied. FINDINGS: A total of approximately 1.6 L of milky brown fluid was removed. Samples were sent to the laboratory as requested by the clinical team. IMPRESSION: Successful ultrasound guided right thoracentesis yielding 1.6 L of pleural fluid. Read by: Soyla Dryer, NP Electronically Signed   By: Sandi Mariscal M.D.   On: 11/13/2019 11:47    Medications:   . carvedilol  6.25 mg Oral BID WC  . heparin  5,000 Units Subcutaneous Q8H  . insulin aspart  0-5 Units Subcutaneous QHS  . insulin aspart  0-9 Units Subcutaneous TID WC  . isosorbide-hydrALAZINE  1 tablet Oral TID  . latanoprost  1 drop Both Eyes QHS  . levETIRAcetam  500 mg Oral QHS  . levothyroxine  50 mcg Oral Daily  . multivitamin  1 tablet Oral QHS  . timolol  1 drop Both Eyes BID   Continuous Infusions: . ceFEPime (MAXIPIME) IV    . vancomycin 1,000 mg (11/14/19 1305)     LOS: 1 day   Janett Billow  Alison Stalling  [facility]   How to contact the [facility] Attending or Consulting provider [place] or covering provider during after hours [place], for this patient?  1. Check the care team in [facility] and look for a) attending/consulting [facility] provider listed and b) the [facility] team listed 2. Log into [email or web address] and use [place] universal password to access. If you do not have the password, please contact the hospital operator. 3. Locate the [facility] provider  you are looking for under [facility] and page to a number that you can be directly reached. 4. If you still have difficulty reaching the provider, please page the [facility] (Director on Call) for the Hospitalists listed on amion for assistance.  11/14/2019, 1:10 PM

## 2019-11-14 NOTE — Progress Notes (Deleted)
Cardiology Clinic Note   Patient Name: Howard Bell Date of Encounter: 11/14/2019  Primary Care Provider:  Horald Pollen, MD Primary Cardiologist:  Quay Burow, MD  Patient Profile    Howard Bell 54 year old male presents to the clinic today for follow-up for his chronic  systolic and diastolic heart failure.  Past Medical History    Past Medical History:  Diagnosis Date  . Arthritis   . Congestive heart failure (CHF) ([place])   . Depression   . 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 Diabetic gastroparesis ([place]) 11/06/2017  . ESRD (end stage renal disease) on dialysis [facility])    M,W.F dialysis  . H/O seasonal allergies   . Hypertension   . Hypothyroidism   . Peripheral vascular disease ([place])   . Pneumonia   . Sleep apnea    not using it now, on continuous O2 2 L during the day, 3 L during the night   Past Surgical History:  Procedure Laterality Date  . AMPUTATION Right 05/14/2018   Procedure: AMPUTATION BELOW KNEE;  Surgeon: Newt Minion, MD;  Location: [place];  Service: Orthopedics;  Laterality: Right;  . AMPUTATION Right 08/04/2018   Procedure: RIGHT ABOVE KNEE AMPUTATION;  Surgeon: Newt Minion, MD;  Location: [place];  Service: Orthopedics;  Laterality: Right;  . AMPUTATION Left 09/08/2018   Procedure: LEFT ABOVE KNEE AMPUTATION;  Surgeon: Newt Minion, MD;  Location: [place];  Service: Orthopedics;  Laterality: Left;  . APPLICATION OF WOUND VAC Left 09/08/2018   Procedure: Application Of Wound Vac;  Surgeon: Newt Minion, MD;  Location: [place];  Service: Orthopedics;  Laterality: Left;  . ESOPHAGOGASTRODUODENOSCOPY (EGD) WITH PROPOFOL N/A 12/05/2017   Procedure: ESOPHAGOGASTRODUODENOSCOPY (EGD) WITH PROPOFOL;  Surgeon: Doran Stabler, MD;  Location: [facility];  Service: Gastroenterology;  Laterality: N/A;  . HERNIA REPAIR    . IR AV DIALY SHUNT INTRO NEEDLE/INTRACATH INITIAL W/PTA/IMG LEFT  03/30/2017  . IR  THORACENTESIS ASP PLEURAL SPACE W/IMG GUIDE  11/13/2019    Allergies  Allergies  Allergen Reactions  . Gabapentin Other (See Comments)    Altered mental status    History of Present Illness    Howard Bell has a PMH of combined chronic systolic and diastolic heart failure, essential hypertension, bilateral BKA, OSA on CPAP, obesity, AMS, ESRD on HD, and type 2 diabetes.  He was admitted to the hospital 08/16/19-08/19/19 with AMS and ongoing seizure-like activity.  He had previously been on Dilantin which was stopped several years ago.  A CTA of his head showed no acute changes and no CVA.  An EEG did not show seizure-like activity.  However clinical improvement was seen with IV Keppra.  Brain MRI also showed no acute changes.  He was euvolemic, blood pressure stable, placed on carvedilol, hydralazine and Imdur.  His amlodipine was stopped.  ARB/ACE avoided due to hyperkalemia.  He was noted to have a mild rise in troponin however it was felt to be non-ACS and no acute changes were seen on his EKG.  He presents to the clinic today for follow-up evaluation and states***  *** denies chest pain, shortness of breath, lower extremity edema, fatigue, palpitations, melena, hematuria, hemoptysis, diaphoresis, weakness, presyncope, syncope, orthopnea, and PND.  Home Medications    Prior to Admission medications   Medication Sig Start Date End Date Taking? Authorizing Provider  acetaminophen (TYLENOL) 325 MG tablet Take 1-2 tablets (325-650 mg total) by mouth every 4 (four)  hours as needed for mild pain. Patient not taking: Reported on 11/13/2019 09/26/18   Love, Ivan Anchors, PA-C  albuterol (PROVENTIL HFA;VENTOLIN HFA) 108 (90 Base) MCG/ACT inhaler Inhale 1 puff into the lungs every 4 (four) hours as needed for wheezing or shortness of breath.     [provider]  calcium acetate (PHOSLO) 667 MG capsule Take 2,668 mg by mouth 3 (three) times daily with meals.    [provider]   carvedilol (COREG) 6.25 MG tablet Take 1 tablet (6.25 mg total) by mouth 2 (two) times daily with a meal. 08/19/19   Thurnell Lose, MD  docusate sodium (COLACE) 100 MG capsule Take 1 capsule (100 mg total) by mouth 2 (two) times daily. Patient taking differently: Take 100 mg by mouth 2 (two) times daily as needed for mild constipation.  01/22/19   Horald Pollen, MD  ferric citrate (AURYXIA) 1 GM 210 MG(Fe) tablet Take 1 tablet (210 mg total) by mouth 3 (three) times daily with meals. Patient not taking: Reported on 10/24/2019 09/29/18   Love, Ivan Anchors, PA-C  fluticasone [facility]) 50 MCG/ACT nasal spray Place 1 spray into both nostrils daily as needed for allergies.     [provider]  glucose blood test strip 1 each by Other route as needed for other. Use as instructed 10/31/18   Horald Pollen, MD  Insulin NPH, Human,, Isophane, (HUMULIN N KWIKPEN) 100 UNIT/ML Kiwkpen Inject 14 Units into the skin every morning. And pen needles 1/day Patient taking differently: Inject 0-4 Units into the skin every morning. And pen needles 1/day 09/11/19   Renato Shin, MD  isosorbide-hydrALAZINE (BIDIL) 20-37.5 MG tablet Take 1 tablet by mouth 3 (three) times daily. 08/19/19   Thurnell Lose, MD  Lancets (ACCU-CHEK SOFT [facility]) lancets Use as instructed 10/31/18   Horald Pollen, MD  latanoprost (XALATAN) 0.005 % ophthalmic solution Place 1 drop into both eyes at bedtime.    [provider]  levETIRAcetam (KEPPRA) 500 MG tablet Take 1 tablet (500 mg total) by mouth at bedtime. 08/19/19   Thurnell Lose, MD  levothyroxine (SYNTHROID) 50 MCG tablet Take 1 tablet (50 mcg total) by mouth daily. 09/12/19   Renato Shin, MD  metoCLOPramide (REGLAN) 5 MG tablet Take 1 tablet (5 mg total) by mouth 3 (three) times daily before meals. Patient taking differently: Take 5 mg by mouth 2 (two) times daily before a meal.  08/30/18   Love, Ivan Anchors, PA-C  multivitamin (RENA-VIT) TABS tablet  Take 1 tablet by mouth at bedtime. 01/22/19   Horald Pollen, MD  omeprazole (PRILOSEC) 20 MG capsule Take 1 capsule (20 mg total) by mouth at bedtime. 01/22/19   Horald Pollen, MD  timolol (BETIMOL) 0.5 % ophthalmic solution Place 1 drop into both eyes 2 (two) times daily. 05/07/17   Joretta Bachelor, PA    Family History    Family History  Problem Relation Age of Onset  . Diabetes Mother   . Hypertension Father    He indicated that his mother is deceased. He indicated that his father is alive. He indicated that his brother is alive.  Social History    Social History   Socioeconomic History  . Marital status: Married    Spouse name: Not on file  . Number of children: Not on file  . Years of education: Not on file  . Highest education level: Not on file  Occupational History  . Not on file  Tobacco Use  . Smoking status: Never Smoker  . Smokeless tobacco: Never Used  Vaping Use  . Vaping Use: Never used  Substance and Sexual Activity  . Alcohol use: No  . Drug use: No  . Sexual activity: Not on file  Other Topics Concern  . Not on file  Social History Narrative  . Not on file   Social Determinants of Health   Financial Resource Strain:   . Difficulty of Paying Living Expenses: Not on file  Food Insecurity:   . Worried About Charity fundraiser in the Last Year: Not on file  . Ran Out of Food in the Last Year: Not on file  Transportation Needs:   . Lack of Transportation (Medical): Not on file  . Lack of Transportation (Non-Medical): Not on file  Physical Activity:   . Days of Exercise per Week: Not on file  . Minutes of Exercise per Session: Not on file  Stress:   . Feeling of Stress : Not on file  Social Connections:   . Frequency of Communication with Friends and Family: Not on file  . Frequency of Social Gatherings with Friends and Family: Not on file  . Attends Religious Services: Not on file  . Active Member of Clubs or Organizations:  Not on file  . Attends Archivist Meetings: Not on file  . Marital Status: Not on file  Intimate Partner Violence:   . Fear of Current or Ex-Partner: Not on file  . Emotionally Abused: Not on file  . Physically Abused: Not on file  . Sexually Abused: Not on file     Review of Systems    General:  No chills, fever, night sweats or weight changes.  Cardiovascular:  No chest pain, dyspnea on exertion, edema, orthopnea, palpitations, paroxysmal nocturnal dyspnea. Dermatological: No rash, lesions/masses Respiratory: No cough, dyspnea Urologic: No hematuria, dysuria Abdominal:   No nausea, vomiting, diarrhea, bright red blood per rectum, melena, or hematemesis Neurologic:  No visual changes, wkns, changes in mental status. All other systems reviewed and are otherwise negative except as noted above.  Physical Exam    VS:  There were no vitals taken for this visit. , BMI There is no height or weight on file to calculate BMI. GEN: Well nourished, well developed, in no acute distress. HEENT: normal. Neck: Supple, no JVD, carotid bruits, or masses. Cardiac: RRR, no murmurs, rubs, or gallops. No clubbing, cyanosis, edema.  Radials/DP/PT 2+ and equal bilaterally.  Respiratory:  Respirations regular and unlabored, clear to auscultation bilaterally. GI: Soft, nontender, nondistended, BS + x 4. MS: no deformity or atrophy. Skin: warm and dry, no rash. Neuro:  Strength and sensation are intact. Psych: Normal affect.  Accessory Clinical Findings    Recent Labs: 10/10/2019: TSH 6.023 10/23/2019: ALT 34 11/12/2019: B Natriuretic Peptide 1,460.5; Magnesium 2.6 11/13/2019: BUN 47; Creatinine, Ser 12.15; Hemoglobin 16.8; Platelets 120; Potassium 4.8; Sodium 134   Recent Lipid Panel    Component Value Date/Time   TRIG 172 (H) 09/09/2018 0330    ECG personally reviewed by me today- *** - No acute changes  EKG 08/17/2019 Sinus rhythm possible anterior infarct undetermined age 39  bpm  Echocardiogram 08/17/2019 IMPRESSIONS     1. Left ventricular ejection fraction, by estimation, is 25 to 30%. The  left ventricle has severely decreased function. The left ventricle  demonstrates global hypokinesis with regional variation and spares the  apex. There is severe left ventricular  hypertrophy. Left ventricular diastolic parameters  are consistent with  Grade I diastolic dysfunction (impaired relaxation). Elevated left  ventricular end-diastolic pressure.   2. Right ventricular systolic function is moderately reduced. The right  ventricular size is mildly enlarged.   3. Left atrial size was mildly dilated.   4. Right atrial size was severely dilated.   5. The mitral valve is abnormal. Trivial mitral valve regurgitation.   6. Tricuspid valve regurgitation is mild to moderate.   7. The aortic valve is tricuspid. Aortic valve regurgitation is not  visualized. Mild aortic valve sclerosis is present, with no evidence of  aortic valve stenosis.   8. Aortic dilatation noted. There is borderline dilatation of the  ascending aorta measuring 39 mm.   Comparison(s): Changes from prior study are noted. 09/05/18: 60-65%, mild  LVH. Dr. Candiss Norse notified of acute changes.  Assessment & Plan   1. Acute on chronic systolic and diastolic CHF-euvolemic today.  No increased work of breathing or chest discomfort.  Echocardiogram showed LVEF of 25-30% down from 60-65%, G1 DD, mildly dilated left and severely dilated right atria.  Aortic dilation 33 mm also noted.  ACE/ARB avoided due to hyperkalemia and renal function. Continue carvedilol, BiDil Heart healthy low-sodium diet-salty 6 given Increase physical activity as tolerated Daily weights Order CBC, CMP  Essential hypertension-BP today*** Continue carvedilol, hydralazine, isosorbide Heart healthy low-sodium diet-salty 6 given Increase physical activity as tolerated Blood pressure log  Obesity-weight today***.  Weight at discharge  215 pounds Continue weight loss Heart healthy low-sodium diet Increase physical activity as tolerated  Diabetes mellitus type II-A1c 10.7 on 08/16/2019 Followed by PCP  Disposition: Follow-up with Dr. Gwenlyn Found in 3 months.   Jossie Ng. Deysha Cartier NP-C    11/14/2019, 8:52 AM [place] [address] Office [phone] Fax [phone]  Notice: This dictation was prepared with Dragon dictation along with smaller phrase technology. Any transcriptional errors that result from this process are unintentional and may not be corrected upon review.

## 2019-11-14 NOTE — Progress Notes (Signed)
[facility] Progress Note  Subjective: seen on HD, more alert this am  Vitals:   11/14/19 1100 11/14/19 1130 11/14/19 1200 11/14/19 1326  BP: 97/69 110/60 106/88 (!) 121/57  Pulse: 88 85 85 93  Resp: 18 18 18 16   Temp:    97.8 F (36.6 C)  TempSrc:    Oral  SpO2:    93%  Weight:      Height:        Exam: Gen more alert today No jvd or bruits Chest dec'd R base, L clear RRR no MRG Abd soft ntnd no mass or ascites +bs obese MS bilat AKA, no edema Neuro Ox 2 today , nonfocal, more alert   Home meds:  - coreg 6.25 bid/ bidil 1 tid  - synthroid  50 ug/   - auryxia ac tid  - insulin NPH 0-4 u sq qam  - reglan 5mg  bid/ prilosec 20 hs  - keppra 500 hs  - eyedrops/ prn's/ vitamins/ supplements    OP HD: [facility] MWF, missed 8/23    4h 65min 89kg  2/2.25 bath  LUA AVF  Hep 6000/ 3000 midrun  L AVF   - last mircera 200 ug on 8/10  - venofoer 60 mg q wk  - no vit D   Assessment/ Plan: 1. Cough/ SOB/ RLL PNA / pleural effusion - on IV abx per primary team, sp R thoracentesis on 8/24. Improving clinically.  2. ESRD - MWF HD. Missed 1 HD Monday. HD today on schedule.   3. AMS - pt looks better today, suspect related to #1 primarily.  4. HTN - BP's low to normal, up 2-3kg by wts. UF same if will tolerate on HD today 5. IDDM - per pmd 6. 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   Inpatient medications: . carvedilol  6.25 mg Oral BID WC  . heparin  5,000 Units Subcutaneous Q8H  . insulin aspart  0-5 Units Subcutaneous QHS  . insulin aspart  0-9 Units  Subcutaneous TID WC  . isosorbide-hydrALAZINE  1 tablet Oral TID  . latanoprost  1 drop Both Eyes QHS  . levETIRAcetam  500 mg Oral QHS  . levothyroxine  50 mcg Oral Daily  . multivitamin  1 tablet Oral QHS  . timolol  1 drop Both Eyes BID   . ceFEPime (MAXIPIME) IV    . vancomycin 1,000 mg (11/14/19 1305)   acetaminophen, albuterol, ondansetron **OR** ondansetron (ZOFRAN) IV

## 2019-11-15 ENCOUNTER — Inpatient Hospital Stay (HOSPITAL_COMMUNITY): Payer: Medicare Other

## 2019-11-15 ENCOUNTER — Ambulatory Visit: Payer: Medicare Other | Admitting: General Practice

## 2019-11-15 LAB — CBC
HCT: 46.2 % (ref 39.0–52.0)
Hemoglobin: 14.8 g/dL (ref 13.0–17.0)
MCH: 29.8 pg (ref 26.0–34.0)
MCHC: 32 g/dL (ref 30.0–36.0)
MCV: 93.1 fL (ref 80.0–100.0)
Platelets: 104 10*3/uL — ABNORMAL LOW (ref 150–400)
RBC: 4.96 MIL/uL (ref 4.22–5.81)
RDW: 16.6 % — ABNORMAL HIGH (ref 11.5–15.5)
WBC: 6 10*3/uL (ref 4.0–10.5)
nRBC: 0 % (ref 0.0–0.2)

## 2019-11-15 LAB — BASIC METABOLIC PANEL
Anion gap: 13 (ref 5–15)
BUN: 36 mg/dL — ABNORMAL HIGH (ref 6–20)
CO2: 27 mmol/L (ref 22–32)
Calcium: 8.7 mg/dL — ABNORMAL LOW (ref 8.9–10.3)
Chloride: 90 mmol/L — ABNORMAL LOW (ref 98–111)
Creatinine, Ser: 9.3 mg/dL — ABNORMAL HIGH (ref 0.61–1.24)
GFR calc Af Amer: 7 mL/min — ABNORMAL LOW (ref >60)
GFR calc non Af Amer: 6 mL/min — ABNORMAL LOW (ref >60)
Glucose, Bld: 182 mg/dL — ABNORMAL HIGH (ref 70–99)
Potassium: 4.5 mmol/L (ref 3.5–5.1)
Sodium: 130 mmol/L — ABNORMAL LOW (ref 135–145)

## 2019-11-15 LAB — GLUCOSE, CAPILLARY
Glucose-Capillary: 201 mg/dL — ABNORMAL HIGH (ref 70–99)
Glucose-Capillary: 150 mg/dL — ABNORMAL HIGH (ref 70–99)
Glucose-Capillary: 129 mg/dL — ABNORMAL HIGH (ref 70–99)
Glucose-Capillary: 111 mg/dL — ABNORMAL HIGH (ref 70–99)

## 2019-11-15 MED ORDER — WHITE PETROLATUM EX OINT
TOPICAL_OINTMENT | CUTANEOUS | Status: AC
  Administered 2019-11-15: 0.2
  Filled 2019-11-15: qty 28.35

## 2019-11-15 MED ORDER — CARVEDILOL 3.125 MG PO TABS
3.1250 mg | ORAL_TABLET | Freq: Two times a day (BID) | ORAL | Status: DC
  Administered 2019-11-15 – 2019-11-17 (×3): 3.125 mg via ORAL
  Filled 2019-11-15 (×3): qty 1

## 2019-11-15 NOTE — Progress Notes (Addendum)
Progress Note    Howard Bell  TXM:[id number] DOB: [birth year]  DOA: 11/12/2019 PCP: Horald Pollen, MD    Brief Narrative:     Medical records reviewed and are as summarized below:  Howard Bell is an 54 y.o. male with medical history significant for diabetes mellitus, hypertension, CHF, ESRD dialyzing Monday/Wednesday/Friday via left upper extremity aVF presented to the ED today complaining of cough.  Reports his last hemodialysis session was on Friday.  Did not have hemodialysis today secondary to not feeling well.Patient states that he has had a persistent and progressively worsening cough over the last 2 weeks.  He reports he has also had some nausea and vomiting that is worsened with his cough.  He does report having some diarrhea over the last week but he was also taking stool softeners.Both yesterday and today he endorses syncope in the setting of a coughing spell both which are brief with return to baseline shortly after the episode with no residual confusion.  Assessment/Plan:   Principal Problem:   Pleural effusion Active Problems:   ESRD on dialysis ([place])   S/P BKA (below knee amputation), right ([place])   Community acquired pneumonia   Type 2 diabetes mellitus with ESRD (end-stage renal disease) (HCC)   Pleural effusion -a large right sided pleural effusion with complete consolidation of right lower lobe.  -s/p thoracentesis with 1.6L removed, appears transudative, cultures negative - Supple oxygen as needed keep O2 sat between 92 to 96% -Weight 99 kg to 107 over last few weeks- so hopefully with HD his breathing will continue to improve  Chronic resp failure -wears 3L O2 at home  Acute on chronic systolic CHF -last echo shows EF around 20% HD again tomm and perhaps Friday -will reduce coreg to allow more volume to be removed    Community acquired pneumonia Patient started on cefepime and vancomycin for antibiotic coverage.  Patient was treated  with antibiotics recently states for pneumonia -plan to de-escalate if cultures remain negative    ESRD on dialysis [facility]) -nephrology consulted    Type 2 diabetes mellitus with ESRD (end-stage renal disease) ([place]) SSI    S/P BKA (below knee amputation), right ([place])    Family Communication/Anticipated D/C date and plan/Code Status   DVT prophylaxis: heparin Code Status: Full Code.  Disposition Plan: Status is: Inpatient  Remains inpatient appropriate because:Inpatient level of care appropriate due to severity of illness   Dispo: The patient is from: Home              Anticipated d/c is to: Home              Anticipated d/c date is: 1 day              Patient currently is not medically stable to d/c.         Medical Consultants:    renal     Subjective:   Not able to provide much input into his breathing  Objective:    Vitals:   11/14/19 1810 11/14/19 2100 11/15/19 0637 11/15/19 1243  BP: 123/70 115/71 100/67 103/65  Pulse: 93 91 87 86  Resp: 16 16 16 16   Temp: 98.5 F (36.9 C) 98.5 F (36.9 C) 98.5 F (36.9 C) 98.4 F (36.9 C)  TempSrc: Oral Oral Oral Oral  SpO2: 100% 94% 91% 90%  Weight:      Height:        Intake/Output Summary (Last 24 hours) at 11/15/2019 1454 Last data  filed at 11/15/2019 0400 Gross per 24 hour  Intake 480 ml  Output --  Net 480 ml   Filed Weights   11/13/19 0300 11/14/19 0553 11/14/19 0811  Weight: 107 kg 91.9 kg 98.5 kg    Exam:   General: Appearance:     Overweight male in no acute distress     Lungs:     On [place], coarse breath sounds L>R  Heart:    Normal heart rate. Normal rhythm. No murmurs, rubs, or gallops.   MS:   Above knee amputation of left lower extremity is noted. Above knee amputation of right lower extremity is noted.   Neurologic: Awake, poor insight into disease process, poor eye contact    Data Reviewed:   I have personally reviewed following labs and imaging studies:  Labs: Labs show  the following:   Basic Metabolic Panel: Recent Labs  Lab 11/12/19 1114 11/12/19 1114 11/12/19 2037 11/13/19 0404 11/13/19 0404 11/14/19 0856 11/15/19 0053  NA 135  --   --  134*  --  129* 130*  K 4.6   < >  --  4.8   < > 4.6 4.5  CL 91*  --   --  89*  --  88* 90*  CO2 25  --   --  21*  --  23 27  GLUCOSE 125*  --   --  59*  --  160* 182*  BUN 39*  --   --  47*  --  65* 36*  CREATININE 11.55*  --   --  12.15*  --  13.78* 9.30*  CALCIUM 10.1  --   --  9.9  --  8.3* 8.7*  MG  --   --  2.6*  --   --   --   --   PHOS  --   --  9.1*  --   --  9.5*  --    < > = values in this interval not displayed.   GFR Estimated Creatinine Clearance: 11.3 mL/min (A) (by C-G formula based on SCr of 9.3 mg/dL (H)). Liver Function Tests: Recent Labs  Lab 11/14/19 0856  ALBUMIN 2.9*   No results for input(s): LIPASE, AMYLASE in the last 168 hours. No results for input(s): AMMONIA in the last 168 hours. Coagulation profile No results for input(s): INR, PROTIME in the last 168 hours.  CBC: Recent Labs  Lab 11/12/19 1114 11/13/19 0404 11/14/19 0850 11/15/19 0053  WBC 6.0 5.9 6.0 6.0  HGB 16.6 16.8 15.0 14.8  HCT 51.8 53.3* 46.0 46.2  MCV 94.7 96.0 93.9 93.1  PLT 120* 120* 117* 104*   Cardiac Enzymes: No results for input(s): CKTOTAL, CKMB, CKMBINDEX, TROPONINI in the last 168 hours. BNP (last 3 results) No results for input(s): PROBNP in the last 8760 hours. CBG: Recent Labs  Lab 11/14/19 1322 11/14/19 1618 11/14/19 2316 11/15/19 0635 11/15/19 1122  GLUCAP 145* 199* 154* 201* 150*   D-Dimer: No results for input(s): DDIMER in the last 72 hours. Hgb A1c: Recent Labs    11/13/19 0404  HGBA1C 9.5*   Lipid Profile: No results for input(s): CHOL, HDL, LDLCALC, TRIG, CHOLHDL, LDLDIRECT in the last 72 hours. Thyroid function studies: No results for input(s): TSH, T4TOTAL, T3FREE, THYROIDAB in the last 72 hours.  Invalid input(s): FREET3 Anemia work up: No results for  input(s): VITAMINB12, FOLATE, FERRITIN, TIBC, IRON, RETICCTPCT in the last 72 hours. Sepsis Labs: Recent Labs  Lab 11/12/19 1114 11/13/19 0404 11/14/19 4010  11/15/19 0053  WBC 6.0 5.9 6.0 6.0    Microbiology Recent Results (from the past 240 hour(s))  SARS Coronavirus 2 by RT PCR (hospital order, performed in [place] hospital lab) Nasopharyngeal Nasopharyngeal Swab     Status: None   Collection Time: 11/07/19  8:19 PM   Specimen: Nasopharyngeal Swab  Result Value Ref Range Status   SARS Coronavirus 2 NEGATIVE NEGATIVE Final    Comment: (NOTE) SARS-CoV-2 target nucleic acids are NOT DETECTED.  The SARS-CoV-2 RNA is generally detectable in upper and lower respiratory specimens during the acute phase of infection. The lowest concentration of SARS-CoV-2 viral copies this assay can detect is 250 copies / mL. A negative result does not preclude SARS-CoV-2 infection and should not be used as the sole basis for treatment or other patient management decisions.  A negative result may occur with improper specimen collection / handling, submission of specimen other than nasopharyngeal swab, presence of viral mutation(s) within the areas targeted by this assay, and inadequate number of viral copies (<250 copies / mL). A negative result must be combined with clinical observations, patient history, and epidemiological information.  Fact Sheet for Patients:   StrictlyIdeas.no  Fact Sheet for Healthcare Providers: BankingDealers.co.za  This test is not yet approved or  cleared by the [place] FDA and has been authorized for detection and/or diagnosis of SARS-CoV-2 by FDA under an Emergency Use Authorization (EUA).  This EUA will remain in effect (meaning this test can be used) for the duration of the COVID-19 declaration under Section 564(b)(1) of the Act, 21 U.S.C. section 360bbb-3(b)(1), unless the authorization is terminated  or revoked sooner.  Performed at [facility], [place] [address] [place], [place] [address]   SARS Coronavirus 2 by RT PCR (hospital order, performed in [facility] hospital lab) Nasopharyngeal Nasopharyngeal Swab     Status: None   Collection Time: 11/12/19  8:37 PM   Specimen: Nasopharyngeal Swab  Result Value Ref Range Status   SARS Coronavirus 2 NEGATIVE NEGATIVE Final    Comment: (NOTE) SARS-CoV-2 target nucleic acids are NOT DETECTED.  The SARS-CoV-2 RNA is generally detectable in upper and lower respiratory specimens during the acute phase of infection. The lowest concentration of SARS-CoV-2 viral copies this assay can detect is 250 copies / mL. A negative result does not preclude SARS-CoV-2 infection and should not be used as the sole basis for treatment or other patient management decisions.  A negative result may occur with improper specimen collection / handling, submission of specimen other than nasopharyngeal swab, presence of viral mutation(s) within the areas targeted by this assay, and inadequate number of viral copies (<250 copies / mL). A negative result must be combined with clinical observations, patient history, and epidemiological information.  Fact Sheet for Patients:   StrictlyIdeas.no  Fact Sheet for Healthcare Providers: BankingDealers.co.za  This test is not yet approved or  cleared by the [place] FDA and has been authorized for detection and/or diagnosis of SARS-CoV-2 by FDA under an Emergency Use Authorization (EUA).  This EUA will remain in effect (meaning this test can be used) for the duration of the COVID-19 declaration under Section 564(b)(1) of the Act, 21 U.S.C. section 360bbb-3(b)(1), unless the authorization is terminated or revoked sooner.  Performed at [facility], [place] [address], [place], [place] [address]   Blood culture (routine x 2)     Status: None (Preliminary  result)   Collection Time: 11/13/19  1:51 AM   Specimen:  BLOOD  Result Value Ref Range Status   Specimen Description BLOOD SITE NOT SPECIFIED  Final   Special Requests   Final    BOTTLES DRAWN AEROBIC AND ANAEROBIC Blood Culture results may not be optimal due to an inadequate volume of blood received in culture bottles   Culture   Final    NO GROWTH 2 DAYS Performed at [facility], [place] [address], [place], [place] [address]    Report Status PENDING  Incomplete  Blood culture (routine x 2)     Status: None (Preliminary result)   Collection Time: 11/13/19  4:04 AM   Specimen: BLOOD RIGHT HAND  Result Value Ref Range Status   Specimen Description BLOOD RIGHT HAND  Final   Special Requests   Final    BOTTLES DRAWN AEROBIC AND ANAEROBIC Blood Culture results may not be optimal due to an inadequate volume of blood received in culture bottles   Culture   Final    NO GROWTH 2 DAYS Performed at [facility], [place] [address] [place], [place] [address]    Report Status PENDING  Incomplete  Body fluid culture     Status: None (Preliminary result)   Collection Time: 11/13/19 11:45 AM   Specimen: Lung, Right; Pleural Fluid  Result Value Ref Range Status   Specimen Description PLEURAL FLUID  Final   Special Requests RIGHT LUNG  Final   Gram Stain   Final    RARE WBC PRESENT,BOTH PMN AND MONONUCLEAR NO ORGANISMS SEEN    Culture   Final    NO GROWTH 2 DAYS Performed at [facility], [place] [address] [place], [place] [address]    Report Status PENDING  Incomplete    Procedures and diagnostic studies:  DG CHEST PORT 1 VIEW  Result Date: 11/15/2019 CLINICAL DATA:  Pleural effusion, cough EXAM: PORTABLE CHEST 1 VIEW COMPARISON:  11/13/2019 chest radiograph. FINDINGS: Vascular stent overlies the left lung apex. Stable cardiomediastinal silhouette with mild cardiomegaly. No pneumothorax. Stable moderate right pleural effusion. No left pleural effusion. Patchy opacities  throughout the right lung with mild diffuse prominence of the parahilar interstitial markings, unchanged. IMPRESSION: 1. Stable moderate right pleural effusion. 2. Stable patchy opacities throughout the right lung with diffuse prominence of the parahilar interstitial markings, favoring a combination of mild pulmonary edema and right lung atelectasis. Stable mild cardiomegaly. Electronically Signed   By: Ilona Sorrel M.D.   On: 11/15/2019 12:40    Medications:   . calcium acetate  2,668 mg Oral TID WC  . carvedilol  3.125 mg Oral BID WC  . heparin  5,000 Units Subcutaneous Q8H  . insulin aspart  0-5 Units Subcutaneous QHS  . insulin aspart  0-9 Units Subcutaneous TID WC  . isosorbide-hydrALAZINE  1 tablet Oral TID  . latanoprost  1 drop Both Eyes QHS  . levETIRAcetam  500 mg Oral QHS  . levothyroxine  50 mcg Oral Daily  . melatonin  3 mg Oral QHS  . metoCLOPramide  5 mg Oral BID AC  . multivitamin  1 tablet Oral QHS  . pantoprazole  40 mg Oral Daily  . timolol  1 drop Both Eyes BID   Continuous Infusions: . ceFEPime (MAXIPIME) IV 2 g (11/14/19 2127)  . vancomycin 1,000 mg (11/14/19 1305)     LOS: 2 days   [facility]   How to contact the [facility] Attending or Consulting provider [place] or covering provider during  after hours 7P -7A, for this patient?  1. Check the care team in [facility] and look for a) attending/consulting TR[facility] provider listed and b) the [facility] team listed 2. Log into [email or web address] and use [place]'s universal password to access. If you do not have the password, please contact the hospital operator. 3. Locate the [facility] provider you are looking for under [facility] and page to a number that you can be directly reached. 4. If you still have difficulty reaching the provider, please page the [facility] (Director on Call) for the Hospitalists listed on amion for assistance.  11/15/2019, 2:54 PM

## 2019-11-15 NOTE — Progress Notes (Addendum)
Subjective: Woke from sleep no complaints no dyspnea tolerated dialysis yesterday Objective Vital signs in last 24 hours: Vitals:   11/14/19 1326 11/14/19 1810 11/14/19 2100 11/15/19 0637  BP: (!) 121/57 123/70 115/71 100/67  Pulse: 93 93 91 87  Resp: 16 16 16 16   Temp: 97.8 F (36.6 C) 98.5 F (36.9 C) 98.5 F (36.9 C) 98.5 F (36.9 C)  TempSrc: Oral Oral Oral Oral  SpO2: 93% 100% 94% 91%  Weight:      Height:       Weight change: 6.6 kg  Physical Exam: General: Alert oriented x3 Heart: RRR no MRG Lungs: Right base decreased breath sounds otherwise clear nonlabored breathing Abdomen: Obese bowel sounds positive soft, NT, ND Extremities: Bilateral AKA's no edema stumps Dialysis Access: Left upper arm AV fistula positive bruit  Home meds: - coreg 6.25 bid/ bidil 1 tid - synthroid 50 ug/  - auryxia ac tid - insulin NPH 0-4 u sq qam - reglan 5mg  bid/ prilosec 20 hs - keppra 500 hs - eyedrops/ prn's/ vitamins/ supplements   OP [facility] [place] MWF, missed 8/23 4h 35min89kg 2/2.25 bath LUA AVF Hep 6000/ 3000 midrun L AVF - last mircera 200 ug on 8/10 - venofoer 60 mg q wk - no vit D   Problem/Plan: 1. Cough/ SOB/ RLL PNA / pleural effusion - on IV abx per primary team, sp R thoracentesis on 8/24. Improving clinically.  2. ESRD - MWF HD. Missed 1 HD Monday. HD yesterday now on schedule.   3. AMS -resolved , suspect related to #1 primarily.  4. HTN - BP's low to normal, this a.m. 100/67, 1 L UF with HD yesterday BP stable, bed weights variable up 2-3kg by wts yesterday and today.9 kg does not reflect exam (bilateral AKA/no standing weight )UF 2 to 3 kg as tolerated tomorrow with dialysis.  If here 5. IDDM - per pmd 6. 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 diet   Ernest Haber, PA-C [place] 11/15/2019,12:14 PM  LOS: 2 days   Pt seen, examined and agree w A/P as above. F/u CXR today showing IS edema, wt's may be true and we should consider this vol overload in ESRD pt (low EF noted per pmd also). Agree w/ lowering coreg dose, will plan HD tomorrow w/ max UF as bp tolerates. May need additional HD Sat.  Kelly Splinter  MD 11/15/2019, 2:06 PM    Labs: Basic Metabolic Panel: Recent Labs  Lab 11/12/19 1114 11/12/19 2037 11/13/19 0404 11/14/19 0856 11/15/19 0053  NA   < >  --  134* 129* 130*  K   < >  --  4.8 4.6 4.5  CL   < >  --  89* 88* 90*  CO2   < >  --  21* 23 27  GLUCOSE   < >  --  59* 160* 182*  BUN   < >  --  47* 65* 36*  CREATININE   < >  --  12.15* 13.78* 9.30*  CALCIUM   < >  --  9.9 8.3* 8.7*  PHOS  --  9.1*  --  9.5*  --    < > = values in this interval not displayed.   Liver Function Tests: Recent Labs  Lab 11/14/19 0856  ALBUMIN 2.9*   No results for input(s): LIPASE,  AMYLASE in the last 168 hours. No results for input(s): AMMONIA in the last 168 hours. CBC: Recent Labs  Lab 11/12/19 1114 11/12/19 1114 11/13/19 0404 11/14/19 0850 11/15/19 0053  WBC 6.0   < > 5.9 6.0 6.0  HGB 16.6   < > 16.8 15.0 14.8  HCT 51.8   < > 53.3* 46.0 46.2  MCV 94.7  --  96.0 93.9 93.1  PLT 120*   < > 120* 117* 104*   < > = values in this interval not displayed.   Cardiac Enzymes: No results for input(s): CKTOTAL, CKMB, CKMBINDEX, TROPONINI in the last 168 hours. CBG: Recent Labs  Lab 11/14/19 1322 11/14/19 1618 11/14/19 2316 11/15/19 0635 11/15/19 1122  GLUCAP 145* 199* 154* 201* 150*    Studies/Results: No results found. Medications: . ceFEPime (MAXIPIME) IV 2 g (11/14/19 2127)  . vancomycin 1,000 mg (11/14/19 1305)   . calcium acetate  2,668 mg Oral TID WC  . carvedilol  6.25 mg Oral BID WC  . heparin  5,000 Units Subcutaneous Q8H  . insulin aspart  0-5 Units Subcutaneous QHS  . insulin aspart  0-9 Units Subcutaneous TID WC  .  isosorbide-hydrALAZINE  1 tablet Oral TID  . latanoprost  1 drop Both Eyes QHS  . levETIRAcetam  500 mg Oral QHS  . levothyroxine  50 mcg Oral Daily  . melatonin  3 mg Oral QHS  . metoCLOPramide  5 mg Oral BID AC  . multivitamin  1 tablet Oral QHS  . pantoprazole  40 mg Oral Daily  . timolol  1 drop Both Eyes BID

## 2019-11-16 LAB — BODY FLUID CULTURE: Culture: NO GROWTH

## 2019-11-16 LAB — RENAL FUNCTION PANEL
Albumin: 2.9 g/dL — ABNORMAL LOW (ref 3.5–5.0)
Anion gap: 13 (ref 5–15)
BUN: 51 mg/dL — ABNORMAL HIGH (ref 6–20)
CO2: 23 mmol/L (ref 22–32)
Calcium: 9.8 mg/dL (ref 8.9–10.3)
Chloride: 90 mmol/L — ABNORMAL LOW (ref 98–111)
Creatinine, Ser: 10.91 mg/dL — ABNORMAL HIGH (ref 0.61–1.24)
GFR calc Af Amer: 6 mL/min — ABNORMAL LOW (ref >60)
GFR calc non Af Amer: 5 mL/min — ABNORMAL LOW (ref >60)
Glucose, Bld: 167 mg/dL — ABNORMAL HIGH (ref 70–99)
Phosphorus: 6.9 mg/dL — ABNORMAL HIGH (ref 2.5–4.6)
Potassium: 4.8 mmol/L (ref 3.5–5.1)
Sodium: 126 mmol/L — ABNORMAL LOW (ref 135–145)

## 2019-11-16 LAB — CBC
HCT: 45.8 % (ref 39.0–52.0)
Hemoglobin: 15 g/dL (ref 13.0–17.0)
MCH: 30.2 pg (ref 26.0–34.0)
MCHC: 32.8 g/dL (ref 30.0–36.0)
MCV: 92.3 fL (ref 80.0–100.0)
Platelets: 113 10*3/uL — ABNORMAL LOW (ref 150–400)
RBC: 4.96 MIL/uL (ref 4.22–5.81)
RDW: 16.9 % — ABNORMAL HIGH (ref 11.5–15.5)
WBC: 5.6 10*3/uL (ref 4.0–10.5)
nRBC: 0 % (ref 0.0–0.2)

## 2019-11-16 LAB — GLUCOSE, CAPILLARY
Glucose-Capillary: 138 mg/dL — ABNORMAL HIGH (ref 70–99)
Glucose-Capillary: 92 mg/dL (ref 70–99)
Glucose-Capillary: 137 mg/dL — ABNORMAL HIGH (ref 70–99)
Glucose-Capillary: 264 mg/dL — ABNORMAL HIGH (ref 70–99)

## 2019-11-16 MED ORDER — ALUM & MAG HYDROXIDE-SIMETH 200-200-20 MG/5ML PO SUSP: 15.0000 mL | Freq: Once | ORAL | Status: DC

## 2019-11-16 MED ORDER — HEPARIN SODIUM (PORCINE) 1000 UNIT/ML DIALYSIS: 4000.0000 [IU] | INTRAMUSCULAR | Status: DC | PRN

## 2019-11-16 MED ORDER — HEPARIN SODIUM (PORCINE) 1000 UNIT/ML IJ SOLN
INTRAMUSCULAR | Status: AC
  Administered 2019-11-16: 3000 [IU]
  Filled 2019-11-16: qty 3

## 2019-11-16 MED ORDER — HEPARIN SODIUM (PORCINE) 1000 UNIT/ML IJ SOLN
INTRAMUSCULAR | Status: AC
  Administered 2019-11-16: 6000 [IU]
  Filled 2019-11-16: qty 6

## 2019-11-16 MED ORDER — VANCOMYCIN HCL IN DEXTROSE 1-5 GM/200ML-% IV SOLN
INTRAVENOUS | Status: AC
  Administered 2019-11-16: 1000 mg via INTRAVENOUS
  Filled 2019-11-16: qty 200

## 2019-11-16 MED ORDER — CHLORHEXIDINE GLUCONATE CLOTH 2 % EX PADS
6.0000 | MEDICATED_PAD | Freq: Every day | CUTANEOUS | Status: DC
  Administered 2019-11-17: 6 via TOPICAL

## 2019-11-16 NOTE — Procedures (Signed)
   I was present at this dialysis session, have reviewed the session itself and made  appropriate changes Kelly Splinter MD [place] pager [phone]   11/16/2019, 9:16 AM

## 2019-11-16 NOTE — Progress Notes (Signed)
[facility] Progress Note   Subjective:   Patient seen and examined at bedside during dialysis.  Reports breathing is improved.  Tolerating dialysis well so far.  Denies CP, n/v/d and abdominal pain.  Biggest compliant right now is difficultly sleeping, says "I just cant sleep here."    Objective Vitals:   11/16/19 1030 11/16/19 1100 11/16/19 1130 11/16/19 1135  BP: (!) 155/87 (!) 139/43 136/69 (!) 145/64  Pulse: 92 82 93 93  Resp:    17  Temp:    (!) 97.4 F (36.3 C)  TempSrc:    Oral  SpO2:    96%  Weight:    89.7 kg  Height:       Physical Exam General:Chronically ill appearing male in NAD Heart:RRR, no mrg Lungs:mostly CTAB, breath sounds decreased on R, nml WOB on O2 Abdomen:soft, NTND Extremities:b/l AKA, no edema Dialysis Access: LU AVF cannulated    Filed Weights   11/14/19 0811 11/16/19 0729 11/16/19 1135  Weight: 98.5 kg 92.3 kg 89.7 kg    Intake/Output Summary (Last 24 hours) at 11/16/2019 1213 Last data filed at 11/16/2019 1135 Gross per 24 hour  Intake 289.76 ml  Output 3000 ml  Net -2710.24 ml    Additional Objective Labs: Basic Metabolic Panel: Recent Labs  Lab 11/12/19 2037 11/13/19 0404 11/14/19 0856 11/15/19 0053 11/16/19 0751  NA  --    < > 129* 130* 126*  K  --    < > 4.6 4.5 4.8  CL  --    < > 88* 90* 90*  CO2  --    < > 23 27 23   GLUCOSE  --    < > 160* 182* 167*  BUN  --    < > 65* 36* 51*  CREATININE  --    < > 13.78* 9.30* 10.91*  CALCIUM  --    < > 8.3* 8.7* 9.8  PHOS 9.1*  --  9.5*  --  6.9*   < > = values in this interval not displayed.   Liver Function Tests: Recent Labs  Lab 11/14/19 0856 11/16/19 0751  ALBUMIN 2.9* 2.9*    CBC: Recent Labs  Lab 11/12/19 1114 11/12/19 1114 11/13/19 0404 11/13/19 0404 11/14/19 0850 11/15/19 0053 11/16/19 0751  WBC 6.0   < > 5.9   < > 6.0 6.0 5.6  HGB 16.6   < > 16.8   < > 15.0 14.8 15.0  HCT 51.8   < > 53.3*   < > 46.0 46.2 45.8  MCV 94.7  --  96.0  --  93.9  93.1 92.3  PLT 120*   < > 120*   < > 117* 104* 113*   < > = values in this interval not displayed.   Blood Culture    Component Value Date/Time   SDES PLEURAL FLUID 11/13/2019 1145   SPECREQUEST RIGHT LUNG 11/13/2019 1145   CULT  11/13/2019 1145    NO GROWTH 3 DAYS Performed at [facility], [place] [address], [place], [place] [address]    REPTSTATUS 11/16/2019 FINAL 11/13/2019 1145   CBG: Recent Labs  Lab 11/15/19 0635 11/15/19 1122 11/15/19 1615 11/15/19 2118 11/16/19 0543  GLUCAP 201* 150* 129* 111* 138*   Studies/Results: DG CHEST PORT 1 VIEW  Result Date: 11/15/2019 CLINICAL DATA:  Pleural effusion, cough EXAM: PORTABLE CHEST 1 VIEW COMPARISON:  11/13/2019 chest radiograph. FINDINGS: Vascular stent overlies the left lung apex. Stable cardiomediastinal silhouette with mild cardiomegaly. No  pneumothorax. Stable moderate right pleural effusion. No left pleural effusion. Patchy opacities throughout the right lung with mild diffuse prominence of the parahilar interstitial markings, unchanged. IMPRESSION: 1. Stable moderate right pleural effusion. 2. Stable patchy opacities throughout the right lung with diffuse prominence of the parahilar interstitial markings, favoring a combination of mild pulmonary edema and right lung atelectasis. Stable mild cardiomegaly. Electronically Signed   By: Ilona Sorrel M.D.   On: 11/15/2019 12:40    Medications: . ceFEPime (MAXIPIME) IV 2 g (11/14/19 2127)   . calcium acetate  2,668 mg Oral TID WC  . carvedilol  3.125 mg Oral BID WC  . heparin  5,000 Units Subcutaneous Q8H  . insulin aspart  0-5 Units Subcutaneous QHS  . insulin aspart  0-9 Units Subcutaneous TID WC  . isosorbide-hydrALAZINE  1 tablet Oral TID  . latanoprost  1 drop Both Eyes QHS  . levETIRAcetam  500 mg Oral QHS  . levothyroxine  50 mcg Oral Daily  . melatonin  3 mg Oral QHS  . metoCLOPramide  5 mg Oral BID AC  . multivitamin  1 tablet Oral QHS  . pantoprazole  40 mg  Oral Daily  . timolol  1 drop Both Eyes BID    Dialysis Orders: [place] MWF, missed 8/23 4h 8min89kg 2/2.25 bath LUA AVF Hep 6000/ 3000 midrun L AVF - last mircera 200 ug on 8/10 - venofoer 60 mg q wk - no vit D  Assessment/Plan: 1. Cough/SOB/RLL PNA/pleural effusion/Volume overload - s/p R thoracentesis on 8/24 with ~1.6L fluid removed, on ABX, cultures NGTD. Clinically improving but remains volume overloaded.  Plan for extra HD tomorrow with net UF goal 3L. Likely need to lower dry on d/c. 2. AMS - resolved. Likely due to #1 3. ESRD - on HD MWF, plan for extra HD tomorrow for volume removal. K 4.8.  4. Anemia of CKD- Hgb 15.0.  No ESA needed.  5. Secondary hyperparathyroidism - CCa and phos elevated. Not on VDRA.  Continue binders, initially held d/t AMS.  6. HTN- BP mostly well controlled, mildly elevated this AM.  7. Nutrition - Renal diet w/fluid restrictions.  8. DM - per primary  Jen Mow, PA-C [place] 11/16/2019,12:13 PM  LOS: 3 days

## 2019-11-16 NOTE — Progress Notes (Signed)
Progress Note    Howard Bell  MMH:[id number] DOB: 12/07/1965  DOA: 11/12/2019 PCP: Horald Pollen, MD    Brief Narrative:     Medical records reviewed and are as summarized below:  Howard Bell is an 54 y.o. male with medical history significant for diabetes mellitus, hypertension, CHF, ESRD dialyzing Monday/Wednesday/Friday via left upper extremity aVF presented to the ED today complaining of cough.  Reports his last hemodialysis session was on Friday.  Did not have hemodialysis today secondary to not feeling well.Patient states that he has had a persistent and progressively worsening cough over the last 2 weeks.  He reports he has also had some nausea and vomiting that is worsened with his cough.  He does report having some diarrhea over the last week but he was also taking stool softeners.Both yesterday and today he endorses syncope in the setting of a coughing spell both which are brief with return to baseline shortly after the episode with no residual confusion.  Assessment/Plan:   Principal Problem:   Pleural effusion Active Problems:   ESRD on dialysis ([place])   S/P BKA (below knee amputation), right ([place])   Community acquired pneumonia   Type 2 diabetes mellitus with ESRD (end-stage renal disease) (H[facility])   Pleural effusion -a large right sided pleural effusion with complete consolidation of right lower lobe.  -s/p thoracentesis with 1.6L removed, appears transudative, cultures negative -lung much improved after HD today and 3L removed  Chronic resp failure -wears 3L O2 at home  Acute on chronic systolic CHF -last echo shows EF around 20% HD today and in the AM -will reduce coreg to allow more volume to be removed    Community acquired pneumonia Patient started on cefepime and vancomycin for antibiotic coverage.  Patient was treated with antibiotics recently states for pneumonia -plan to d/c abx    ESRD on dialysis [facility]) -nephrology  consulted    Type 2 diabetes mellitus with ESRD (end-stage renal disease) ([place]) SSI    S/P BKA (below knee amputation), right ([place])    Family Communication/Anticipated D/C date and plan/Code Status   DVT prophylaxis: heparin Code Status: Full Code.  Disposition Plan: Status is: Inpatient  Remains inpatient appropriate because:Inpatient level of care appropriate due to severity of illness   Dispo: The patient is from: Home              Anticipated d/c is to: Home              Anticipated d/c date is: 1 day Saturday after HD              Patient currently is not medically stable to d/c.         Medical Consultants:    renal     Subjective:   No complaints   Objective:    Vitals:   11/16/19 1100 11/16/19 1130 11/16/19 1135 11/16/19 1247  BP: (!) 139/43 136/69 (!) 145/64 125/82  Pulse: 82 93 93 94  Resp:   17 17  Temp:   (!) 97.4 F (36.3 C) 97.6 F (36.4 C)  TempSrc:   Oral Oral  SpO2:   96% 95%  Weight:   89.7 kg   Height:        Intake/Output Summary (Last 24 hours) at 11/16/2019 1428 Last data filed at 11/16/2019 1135 Gross per 24 hour  Intake 289.76 ml  Output 3000 ml  Net -2710.24 ml   Filed Weights   11/14/19 0811 11/16/19 0729  11/16/19 1135  Weight: 98.5 kg 92.3 kg 89.7 kg    Exam:   General: Appearance:     Overweight male in no acute distress     Lungs:     On [place], lungs clearer, respirations unlabored  Heart:    Normal heart rate. Normal rhythm. No murmurs, rubs, or gallops.   MS:   Above knee amputation of left lower extremity is noted. Above knee amputation of right lower extremity is noted. .   Neurologic:   Awake, alert, oriented x 3. No apparent focal neurological           defect.      Data Reviewed:   I have personally reviewed following labs and imaging studies:  Labs: Labs show the following:   Basic Metabolic Panel: Recent Labs  Lab 11/12/19 1114 11/12/19 1114 11/12/19 2037 11/13/19 0404 11/13/19 0404  11/14/19 0856 11/14/19 0856 11/15/19 0053 11/16/19 0751  NA 135  --   --  134*  --  129*  --  130* 126*  K 4.6   < >  --  4.8   < > 4.6   < > 4.5 4.8  CL 91*  --   --  89*  --  88*  --  90* 90*  CO2 25  --   --  21*  --  23  --  27 23  GLUCOSE 125*  --   --  59*  --  160*  --  182* 167*  BUN 39*  --   --  47*  --  65*  --  36* 51*  CREATININE 11.55*  --   --  12.15*  --  13.78*  --  9.30* 10.91*  CALCIUM 10.1  --   --  9.9  --  8.3*  --  8.7* 9.8  MG  --   --  2.6*  --   --   --   --   --   --   PHOS  --   --  9.1*  --   --  9.5*  --   --  6.9*   < > = values in this interval not displayed.   GFR Estimated Creatinine Clearance: 8.7 mL/min (A) (by C-G formula based on SCr of 10.91 mg/dL (H)). Liver Function Tests: Recent Labs  Lab 11/14/19 0856 11/16/19 0751  ALBUMIN 2.9* 2.9*   No results for input(s): LIPASE, AMYLASE in the last 168 hours. No results for input(s): AMMONIA in the last 168 hours. Coagulation profile No results for input(s): INR, PROTIME in the last 168 hours.  CBC: Recent Labs  Lab 11/12/19 1114 11/13/19 0404 11/14/19 0850 11/15/19 0053 11/16/19 0751  WBC 6.0 5.9 6.0 6.0 5.6  HGB 16.6 16.8 15.0 14.8 15.0  HCT 51.8 53.3* 46.0 46.2 45.8  MCV 94.7 96.0 93.9 93.1 92.3  PLT 120* 120* 117* 104* 113*   Cardiac Enzymes: No results for input(s): CKTOTAL, CKMB, CKMBINDEX, TROPONINI in the last 168 hours. BNP (last 3 results) No results for input(s): PROBNP in the last 8760 hours. CBG: Recent Labs  Lab 11/15/19 1122 11/15/19 1615 11/15/19 2118 11/16/19 0543 11/16/19 1242  GLUCAP 150* 129* 111* 138* 92   D-Dimer: No results for input(s): DDIMER in the last 72 hours. Hgb A1c: No results for input(s): HGBA1C in the last 72 hours. Lipid Profile: No results for input(s): CHOL, HDL, LDLCALC, TRIG, CHOLHDL, LDLDIRECT in the last 72 hours. Thyroid function studies: No results for input(s): TSH, T4TOTAL,  T3FREE, THYROIDAB in the last 72 hours.  Invalid  input(s): FREET3 Anemia work up: No results for input(s): VITAMINB12, FOLATE, FERRITIN, TIBC, IRON, RETICCTPCT in the last 72 hours. Sepsis Labs: Recent Labs  Lab 11/13/19 0404 11/14/19 0850 11/15/19 0053 11/16/19 0751  WBC 5.9 6.0 6.0 5.6    Microbiology Recent Results (from the past 240 hour(s))  SARS Coronavirus 2 by RT PCR (hospital order, performed in [facility] hospital lab) Nasopharyngeal Nasopharyngeal Swab     Status: None   Collection Time: 11/07/19  8:19 PM   Specimen: Nasopharyngeal Swab  Result Value Ref Range Status   SARS Coronavirus 2 NEGATIVE NEGATIVE Final    Comment: (NOTE) SARS-CoV-2 target nucleic acids are NOT DETECTED.  The SARS-CoV-2 RNA is generally detectable in upper and lower respiratory specimens during the acute phase of infection. The lowest concentration of SARS-CoV-2 viral copies this assay can detect is 250 copies / mL. A negative result does not preclude SARS-CoV-2 infection and should not be used as the sole basis for treatment or other patient management decisions.  A negative result may occur with improper specimen collection / handling, submission of specimen other than nasopharyngeal swab, presence of viral mutation(s) within the areas targeted by this assay, and inadequate number of viral copies (<250 copies / mL). A negative result must be combined with clinical observations, patient history, and epidemiological information.  Fact Sheet for Patients:   StrictlyIdeas.no  Fact Sheet for Healthcare Providers: BankingDealers.co.za  This test is not yet approved or  cleared by the [place] FDA and has been authorized for detection and/or diagnosis of SARS-CoV-2 by FDA under an Emergency Use Authorization (EUA).  This EUA will remain in effect (meaning this test can be used) for the duration of the COVID-19 declaration under Section 564(b)(1) of the Act, 21 U.S.C. section  360bbb-3(b)(1), unless the authorization is terminated or revoked sooner.  Performed at [facility], [place] [address], [place], [place] [address]   SARS Coronavirus 2 by RT PCR (hospital order, performed in [facility] hospital lab) Nasopharyngeal Nasopharyngeal Swab     Status: None   Collection Time: 11/12/19  8:37 PM   Specimen: Nasopharyngeal Swab  Result Value Ref Range Status   SARS Coronavirus 2 NEGATIVE NEGATIVE Final    Comment: (NOTE) SARS-CoV-2 target nucleic acids are NOT DETECTED.  The SARS-CoV-2 RNA is generally detectable in upper and lower respiratory specimens during the acute phase of infection. The lowest concentration of SARS-CoV-2 viral copies this assay can detect is 250 copies / mL. A negative result does not preclude SARS-CoV-2 infection and should not be used as the sole basis for treatment or other patient management decisions.  A negative result may occur with improper specimen collection / handling, submission of specimen other than nasopharyngeal swab, presence of viral mutation(s) within the areas targeted by this assay, and inadequate number of viral copies (<250 copies / mL). A negative result must be combined with clinical observations, patient history, and epidemiological information.  Fact Sheet for Patients:   StrictlyIdeas.no  Fact Sheet for Healthcare Providers: BankingDealers.co.za  This test is not yet approved or  cleared by the [place] FDA and has been authorized for detection and/or diagnosis of SARS-CoV-2 by FDA under an Emergency Use Authorization (EUA).  This EUA will remain in effect (meaning this test can be used) for the duration of the COVID-19 declaration under Section 564(b)(1) of the Act, 21 U.S.C. section 360bbb-3(b)(1), unless the authorization is terminated or revoked  sooner.  Performed at [facility], [place] [address] [place], [place] [address]   Blood  culture (routine x 2)     Status: None (Preliminary result)   Collection Time: 11/13/19  1:51 AM   Specimen: BLOOD  Result Value Ref Range Status   Specimen Description BLOOD SITE NOT SPECIFIED  Final   Special Requests   Final    BOTTLES DRAWN AEROBIC AND ANAEROBIC Blood Culture results may not be optimal due to an inadequate volume of blood received in culture bottles   Culture   Final    NO GROWTH 3 DAYS Performed at [facility], [place] [address], [place], [place] [address]    Report Status PENDING  Incomplete  Blood culture (routine x 2)     Status: None (Preliminary result)   Collection Time: 11/13/19  4:04 AM   Specimen: BLOOD RIGHT HAND  Result Value Ref Range Status   Specimen Description BLOOD RIGHT HAND  Final   Special Requests   Final    BOTTLES DRAWN AEROBIC AND ANAEROBIC Blood Culture results may not be optimal due to an inadequate volume of blood received in culture bottles   Culture   Final    NO GROWTH 3 DAYS Performed at [facility], [place] [address] [place], [place] [address]    Report Status PENDING  Incomplete  Body fluid culture     Status: None   Collection Time: 11/13/19 11:45 AM   Specimen: Lung, Right; Pleural Fluid  Result Value Ref Range Status   Specimen Description PLEURAL FLUID  Final   Special Requests RIGHT LUNG  Final   Gram Stain   Final    RARE WBC PRESENT,BOTH PMN AND MONONUCLEAR NO ORGANISMS SEEN    Culture   Final    NO GROWTH 3 DAYS Performed at [facility], [place] [address] [place], [place] [address]    Report Status 11/16/2019 FINAL  Final  Fungus Culture With Stain     Status: None (Preliminary result)   Collection Time: 11/13/19 11:45 AM   Specimen: Lung, Right; Pleural Fluid  Result Value Ref Range Status   Fungus Stain Final report  Final    Comment: (NOTE) Performed At: [facility] [place], [place] [id number] Rush Farmer MD YI:[id number]    Fungus (Mycology) Culture PENDING   Incomplete   Fungal Source PLEURAL  Final    Comment: FLUID RIGHT LUNG Performed at [facility], [place] [address] [place], [place] [address]   Fungus Culture Result     Status: None   Collection Time: 11/13/19 11:45 AM  Result Value Ref Range Status   Result 1 Comment  Final    Comment: (NOTE) KOH/Calcofluor preparation:  no fungus observed. Performed At: [facility] [place], [place] [id number] Rush Farmer MD IR:[id number]     Procedures and diagnostic studies:  DG CHEST PORT 1 VIEW  Result Date: 11/15/2019 CLINICAL DATA:  Pleural effusion, cough EXAM: PORTABLE CHEST 1 VIEW COMPARISON:  11/13/2019 chest radiograph. FINDINGS: Vascular stent overlies the left lung apex. Stable cardiomediastinal silhouette with mild cardiomegaly. No pneumothorax. Stable moderate right pleural effusion. No left pleural effusion. Patchy opacities throughout the right lung with mild diffuse prominence of the parahilar interstitial markings, unchanged. IMPRESSION: 1. Stable moderate right pleural effusion. 2. Stable patchy opacities throughout the right lung with diffuse prominence of the parahilar interstitial markings, favoring a combination of mild pulmonary edema and right lung  atelectasis. Stable mild cardiomegaly. Electronically Signed   By: Ilona Sorrel M.D.   On: 11/15/2019 12:40    Medications:   . calcium acetate  2,668 mg Oral TID WC  . carvedilol  3.125 mg Oral BID WC  . [START ON 11/17/2019] Chlorhexidine Gluconate Cloth  6 each Topical Q0600  . heparin  5,000 Units Subcutaneous Q8H  . insulin aspart  0-5 Units Subcutaneous QHS  . insulin aspart  0-9 Units Subcutaneous TID WC  . isosorbide-hydrALAZINE  1 tablet Oral TID  . latanoprost  1 drop Both Eyes QHS  . levETIRAcetam  500 mg Oral QHS  . levothyroxine  50 mcg Oral Daily  . melatonin  3 mg Oral QHS  . metoCLOPramide  5 mg Oral BID AC  . multivitamin  1 tablet Oral QHS  . pantoprazole  40 mg Oral Daily   . timolol  1 drop Both Eyes BID   Continuous Infusions: . ceFEPime (MAXIPIME) IV 2 g (11/14/19 2127)     LOS: 3 days   Geradine Girt  [facility]   How to contact the [facility] Attending or Consulting provider [place] or covering provider during after hours [place], for this patient?  1. Check the care team in [facility] and look for a) attending/consulting [facility] provider listed and b) the [facility] team listed 2. Log into [email or web address] and use [place]'s universal password to access. If you do not have the password, please contact the hospital operator. 3. Locate the [facility] provider you are looking for under [facility] and page to a number that you can be directly reached. 4. If you still have difficulty reaching the provider, please page the [facility] (Director on Call) for the Hospitalists listed on amion for assistance.  11/16/2019, 2:28 PM

## 2019-11-17 LAB — BASIC METABOLIC PANEL
Anion gap: 15 (ref 5–15)
BUN: 31 mg/dL — ABNORMAL HIGH (ref 6–20)
CO2: 25 mmol/L (ref 22–32)
Calcium: 9.4 mg/dL (ref 8.9–10.3)
Chloride: 92 mmol/L — ABNORMAL LOW (ref 98–111)
Creatinine, Ser: 8.27 mg/dL — ABNORMAL HIGH (ref 0.61–1.24)
GFR calc Af Amer: 8 mL/min — ABNORMAL LOW (ref >60)
GFR calc non Af Amer: 7 mL/min — ABNORMAL LOW (ref >60)
Glucose, Bld: 200 mg/dL — ABNORMAL HIGH (ref 70–99)
Potassium: 4.1 mmol/L (ref 3.5–5.1)
Sodium: 132 mmol/L — ABNORMAL LOW (ref 135–145)

## 2019-11-17 LAB — CBC
HCT: 45.5 % (ref 39.0–52.0)
Hemoglobin: 14.8 g/dL (ref 13.0–17.0)
MCH: 30.1 pg (ref 26.0–34.0)
MCHC: 32.5 g/dL (ref 30.0–36.0)
MCV: 92.7 fL (ref 80.0–100.0)
Platelets: 120 10*3/uL — ABNORMAL LOW (ref 150–400)
RBC: 4.91 MIL/uL (ref 4.22–5.81)
RDW: 16.9 % — ABNORMAL HIGH (ref 11.5–15.5)
WBC: 5.8 10*3/uL (ref 4.0–10.5)
nRBC: 0 % (ref 0.0–0.2)

## 2019-11-17 LAB — GLUCOSE, CAPILLARY
Glucose-Capillary: 169 mg/dL — ABNORMAL HIGH (ref 70–99)
Glucose-Capillary: 146 mg/dL — ABNORMAL HIGH (ref 70–99)

## 2019-11-17 LAB — TSH: TSH: 7.137 u[IU]/mL — ABNORMAL HIGH (ref 0.350–4.500)

## 2019-11-17 MED ORDER — DOCUSATE SODIUM 100 MG PO CAPS: 100.0000 mg | ORAL_CAPSULE | Freq: Two times a day (BID) | ORAL | Status: AC | PRN

## 2019-11-17 MED ORDER — FAMOTIDINE 20 MG PO TABS
20.0000 mg | ORAL_TABLET | Freq: Two times a day (BID) | ORAL | Status: DC | PRN
  Administered 2019-11-17: 20 mg via ORAL
  Filled 2019-11-17: qty 1

## 2019-11-17 MED ORDER — SODIUM CHLORIDE 0.9 % IV SOLN
2.0000 g | Freq: Once | INTRAVENOUS | Status: DC
  Filled 2019-11-17: qty 2

## 2019-11-17 MED ORDER — HEPARIN SODIUM (PORCINE) 1000 UNIT/ML IJ SOLN
INTRAMUSCULAR | Status: AC
  Administered 2019-11-17: 6000 [IU] via INTRAVENOUS_CENTRAL
  Filled 2019-11-17: qty 6

## 2019-11-17 MED ORDER — HUMULIN N KWIKPEN 100 UNIT/ML ~~LOC~~ SUPN: 0.0000 [IU] | PEN_INJECTOR | SUBCUTANEOUS | Status: AC

## 2019-11-17 MED ORDER — CARVEDILOL 3.125 MG PO TABS
3.1250 mg | ORAL_TABLET | Freq: Two times a day (BID) | ORAL | 0 refills | Status: AC
Start: 2019-11-17 — End: ?

## 2019-11-17 NOTE — Progress Notes (Signed)
[facility] Progress Note   Subjective: Patient seen and examined at bedside during dialysis.  Tolerating well so far.  Denies SOB, CP, abdominal pain, n/v/d, weakness, dizziness and fatigue.     Objective Vitals:   11/17/19 0744 11/17/19 0803 11/17/19 0830 11/17/19 0900  BP: 115/70 110/66 130/70 124/76  Pulse: 98 84 84 86  Resp: 18     Temp: 97.8 F (36.6 C)     TempSrc: Oral     SpO2: 94%     Weight: 91 kg     Height:       Physical Exam General:chronically ill appearing male in NAD Heart:RRR, no mrg Lungs:+diffuse rhonchi, nml WOB on 2L O2 Abdomen:soft, NTND Extremities:b/l AKA, no edema Dialysis Access: LU AVF cannulated    Filed Weights   11/16/19 0729 11/16/19 1135 11/17/19 0744  Weight: 92.3 kg 89.7 kg 91 kg    Intake/Output Summary (Last 24 hours) at 11/17/2019 0907 Last data filed at 11/17/2019 0600 Gross per 24 hour  Intake 240 ml  Output 3000 ml  Net -2760 ml    Additional Objective Labs: Basic Metabolic Panel: Recent Labs  Lab 11/12/19 2037 11/13/19 0404 11/14/19 0856 11/14/19 0856 11/15/19 0053 11/16/19 0751 11/17/19 0230  NA  --    < > 129*   < > 130* 126* 132*  K  --    < > 4.6   < > 4.5 4.8 4.1  CL  --    < > 88*   < > 90* 90* 92*  CO2  --    < > 23   < > 27 23 25   GLUCOSE  --    < > 160*   < > 182* 167* 200*  BUN  --    < > 65*   < > 36* 51* 31*  CREATININE  --    < > 13.78*   < > 9.30* 10.91* 8.27*  CALCIUM  --    < > 8.3*   < > 8.7* 9.8 9.4  PHOS 9.1*  --  9.5*  --   --  6.9*  --    < > = values in this interval not displayed.   Liver Function Tests: Recent Labs  Lab 11/14/19 0856 11/16/19 0751  ALBUMIN 2.9* 2.9*   CBC: Recent Labs  Lab 11/13/19 0404 11/13/19 0404 11/14/19 0850 11/14/19 0850 11/15/19 0053 11/16/19 0751 11/17/19 0730  WBC 5.9   < > 6.0   < > 6.0 5.6 5.8  HGB 16.8   < > 15.0   < > 14.8 15.0 14.8  HCT 53.3*   < > 46.0   < > 46.2 45.8 45.5  MCV 96.0  --  93.9  --  93.1 92.3 92.7  PLT 120*    < > 117*   < > 104* 113* 120*   < > = values in this interval not displayed.   Blood Culture    Component Value Date/Time   SDES PLEURAL FLUID 11/13/2019 1145   SPECREQUEST RIGHT LUNG 11/13/2019 1145   CULT  11/13/2019 1145    NO GROWTH 3 DAYS Performed at [facility], [place] [address] [place], [place] [address]    REPTSTATUS 11/16/2019 FINAL 11/13/2019 1145   CBG: Recent Labs  Lab 11/16/19 0543 11/16/19 1242 11/16/19 1606 11/16/19 2136 11/17/19 0614  GLUCAP 138* 92 137* 264* 169*   Studies/Results: DG CHEST PORT 1 VIEW  Result Date: 11/15/2019 CLINICAL DATA:  Pleural effusion, cough EXAM:  PORTABLE CHEST 1 VIEW COMPARISON:  11/13/2019 chest radiograph. FINDINGS: Vascular stent overlies the left lung apex. Stable cardiomediastinal silhouette with mild cardiomegaly. No pneumothorax. Stable moderate right pleural effusion. No left pleural effusion. Patchy opacities throughout the right lung with mild diffuse prominence of the parahilar interstitial markings, unchanged. IMPRESSION: 1. Stable moderate right pleural effusion. 2. Stable patchy opacities throughout the right lung with diffuse prominence of the parahilar interstitial markings, favoring a combination of mild pulmonary edema and right lung atelectasis. Stable mild cardiomegaly. Electronically Signed   By: Ilona Sorrel M.D.   On: 11/15/2019 12:40    Medications:  . heparin sodium (porcine)      . alum & mag hydroxide-simeth  15 mL Oral Once  . calcium acetate  2,668 mg Oral TID WC  . carvedilol  3.125 mg Oral BID WC  . Chlorhexidine Gluconate Cloth  6 each Topical Q0600  . heparin  5,000 Units Subcutaneous Q8H  . insulin aspart  0-5 Units Subcutaneous QHS  . insulin aspart  0-9 Units Subcutaneous TID WC  . isosorbide-hydrALAZINE  1 tablet Oral TID  . latanoprost  1 drop Both Eyes QHS  . levETIRAcetam  500 mg Oral QHS  . levothyroxine  50 mcg Oral Daily  . melatonin  3 mg Oral QHS  . metoCLOPramide  5 mg Oral  BID AC  . multivitamin  1 tablet Oral QHS  . pantoprazole  40 mg Oral Daily  . timolol  1 drop Both Eyes BID    Dialysis Orders: [facility] MWF, missed 8/23 4h 53min89kg 2/2.25 bath LUA AVF Hep 6000/ 3000 midrun L AVF - last mircera 200 ug on 8/10 - venofoer 60 mg q wk - no vit D  Assessment/Plan: 1. Cough/SOB/RLL PNA/pleural effusion/Volume overload - s/p R thoracentesis on 8/24 with ~1.6L fluid removed, on ABX, cultures NGTD. Clinically improving but remains volume overloaded.  Extra HD today for volume removal.  Net UF goal 3L, lower dry on d/c.  2. AMS - resolved. Likely due to #1 3. ESRD - on HD MWF, plan for extra HD today for volume removal. Next HD 8/30. K 4.1.  4. Anemia of CKD- Hgb 15.0.  No ESA needed.  5. Secondary hyperparathyroidism - CCa and phos elevated. Not on VDRA.  Continue binders, initially held d/t AMS.  6. HTN- BP mostly well controlled, mildly elevated this AM but improving with HD.  7. Nutrition - Renal diet w/fluid restrictions.  8. DM - per primary  Jen Mow, PA-C [place] 11/17/2019,9:07 AM  LOS: 4 days

## 2019-11-17 NOTE — Care Management (Signed)
Per nursing staff patient refused to take oxygen home. Notified Zach with [employer], and brought equipment to CM office for him to pick up Monday and credit the patient.

## 2019-11-17 NOTE — Discharge Summary (Signed)
Physician Discharge Summary  Howard Bell JJO:[id number] DOB: 1965/10/21 DOA: 11/12/2019  PCP: Horald Pollen, MD  Admit date: 11/12/2019 Discharge date: 11/17/2019  Admitted From: home Discharge disposition: home   Recommendations for Outpatient Follow-Up:   1. Lower dry weight with HD 2. Continue to encourage compliance with fluid restriction    Discharge Diagnosis:   Principal Problem:   Pleural effusion Active Problems:   ESRD on dialysis ([place])   S/P BKA (below knee amputation), right ([place])   Community acquired pneumonia   Type 2 diabetes mellitus with ESRD (end-stage renal disease) ([place])    Discharge Condition: Improved.  Diet recommendation: renal/carb mod diet  Wound care: None.  Code status: Full.   History of Present Illness:   Howard Bell is a 54 y.o. male with medical history significant for diabetes mellitus, hypertension, CHF, ESRD dialyzing Monday/Wednesday/Friday via left upper extremity aVF presented to the ED today complaining of cough.  Reports his last hemodialysis session was on Friday.  Did not have hemodialysis today secondary to not feeling well.Patient states that he has had a persistent and progressively worsening cough over the last 2 weeks.  He reports he has also had some nausea and vomiting that is worsened with his cough.  He does report having some diarrhea over the last week but he was also taking stool softeners.Both yesterday and today he endorses syncope in the setting of a coughing spell both which are brief with return to baseline shortly after the episode with no residual confusion.  He reports no seizure activity.  He states he has never had syncopal episodes in the past.  He has had some nonradiating chest pain that occurs with coughing.  He reports no sputum production with his cough.  He states he has not had any fever.  He reports he did have a course of antibiotic therapy approximately a month ago for  pneumonia.  He reports his blood sugars have been controlled running in the 120-180 range.  Does not report taking any over-the-counter cough or cold medications at home.   Hospital Course by Problem:   Pleural effusion -a large right sided pleural effusion with complete consolidation of right lower lobe. -s/p thoracentesis with 1.6L removed, appears transudative, cultures negative -lung much improved after HD  and 3L removed ? If right sided pleural effusion reaccumlates despite lower dry weight-- ? Need to r/o SVC syndrome (has puffy appearing face but per report is chronic)  Chronic resp failure -wears 3L O2 at home  Acute on chronic systolic CHF -last echo shows EF around 20% -volume maintained with HD -will reduce coreg to allow more volume to be removed  Community acquired pneumonia Patient started on cefepime and vancomycin for antibiotic coverage. Patient was treated with antibiotics recently states for pneumonia -plan to d/c abx as doubt infectious  ESRD on dialysis [facility]) -nephrology consulted -will need lower dry weight upon d/c  Type 2 diabetes mellitus with ESRD (end-stage renal disease) ([place])  S/P BKA (below knee amputation), right [facility])    Medical Consultants:   Renal IR   Discharge Exam:   Vitals:   11/17/19 1000 11/17/19 1030  BP: 135/86 110/73  Pulse: 93 89  Resp:    Temp:    SpO2:     Vitals:   11/17/19 0900 11/17/19 0930 11/17/19 1000 11/17/19 1030  BP: 124/76 (!) 126/104 135/86 110/73  Pulse: 86 88 93 89  Resp:      Temp:  TempSrc:      SpO2:      Weight:      Height:        General exam: Appears calm and comfortable.   The results of significant diagnostics from this hospitalization (including imaging, microbiology, ancillary and laboratory) are listed below for reference.     Procedures and Diagnostic Studies:   DG Chest 1 View  Result Date: 11/13/2019 CLINICAL DATA:  Status post thoracentesis. EXAM: CHEST   1 VIEW COMPARISON:  November 12, 2019. FINDINGS: Stable cardiomegaly. No pneumothorax is noted. Left lung is clear. Right pleural effusion is significantly smaller status post thoracentesis. Right basilar atelectasis or infiltrate is noted. Bony thorax is unremarkable. IMPRESSION: Right pleural effusion is significantly smaller status post thoracentesis. Right basilar atelectasis or infiltrate is noted. No pneumothorax is noted. Electronically Signed   By: Marijo Conception M.D.   On: 11/13/2019 11:30   CT Angio Chest PE W and/or Wo Contrast  Result Date: 11/13/2019 CLINICAL DATA:  55 year old male with chest pain. Concern for pulmonary embolism. EXAM: CT ANGIOGRAPHY CHEST WITH CONTRAST TECHNIQUE: Multidetector CT imaging of the chest was performed using the standard protocol during bolus administration of intravenous contrast. Multiplanar CT image reconstructions and MIPs were obtained to evaluate the vascular anatomy. CONTRAST:  33mL OMNIPAQUE IOHEXOL 350 MG/ML SOLN COMPARISON:  Chest CT dated 08/16/2019. FINDINGS: Cardiovascular: There is mild cardiomegaly. No pericardial effusion. Advanced 3 vessel coronary vascular calcification. There is mild atherosclerotic calcification of the thoracic aorta. No aneurysmal dilatation. The origins of the great vessels of the aortic arch appear patent. Evaluation of the pulmonary arteries is limited due to suboptimal opacification. No pulmonary artery embolus identified. 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 Mediastinum/Nodes: Evaluation of the hilar lymph nodes is limited due to consolidative changes of the lungs. Subcarinal adenopathy measures 2 cm. Right paratracheal adenopathy measures 15 mm in short axis. Mildly enlarged lymph node in the prevascular space measures 12 mm short axis, slightly increased since the prior CT. The esophagus is poorly visualized but grossly unremarkable. No mediastinal fluid collection. Lungs/Pleura: There  is a large right pleural effusion, new since the prior CT. There is complete consolidation of the right lower lobe which may represent atelectasis or infiltrate. There are large areas of consolidation involving the right upper and right middle lobes. Diffuse hazy airspace density throughout the left lung with left lung base streaky density and interstitial prominence. There is no pneumothorax. The central airways are patent. Upper Abdomen: Advanced vascular calcification. Musculoskeletal: No acute osseous pathology. Extensive vascular collaterals noted within the chest wall. Review of the MIP images confirms the above findings. IMPRESSION: 1. No CT evidence of pulmonary embolism. 2. Large right pleural effusion, new since the prior CT. 3. Complete consolidation of the right lower lobe as well as large areas of consolidation involving the right upper and right middle lobes most consistent with pneumonia. Clinical correlation and follow-up to resolution recommended. 4. Diffuse left lung haziness and lower lobe streaky densities may represent atelectasis or edema. 5. Mild cardiomegaly with advanced 3 vessel coronary vascular calcification. 6. 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 7. Aortic Atherosclerosis (ICD10-I70.0). Electronically Signed   By: Anner Crete M.D.   On: 11/13/2019 01:22   DG Chest Port 1 View  Result Date: 11/12/2019 CLINICAL DATA:  Cough, syncope EXAM: PORTABLE CHEST 1 VIEW COMPARISON:  11/07/2019 FINDINGS: Large right pleural effusion is present,  increased in size since prior examination, with marked compressive atelectasis of the right lung. Left lung is clear. No pneumothorax. No pleural effusion on the left. Cardiac size is mildly enlarged, unchanged. Multiple vascular stents are again seen within the expected central veins of the left upper extremity. No acute bone abnormality. IMPRESSION: Large right  pleural effusion, increased in size since prior examination, with marked compressive atelectasis of the right lung. Electronically Signed   By: Fidela Salisbury MD   On: 11/12/2019 21:18   IR THORACENTESIS ASP PLEURAL SPACE W/IMG GUIDE  Result Date: 11/13/2019 INDICATION: Patient with a history of right lower lobe pneumonia now with right pleural effusion. Interventional radiology asked to perform a therapeutic and diagnostic thoracentesis. EXAM: ULTRASOUND GUIDED RIGHT THORACENTESIS MEDICATIONS: 1% lidocaine 10 mL COMPLICATIONS: None immediate. PROCEDURE: An ultrasound guided thoracentesis was thoroughly discussed with the patient and questions answered. The benefits, risks, alternatives and complications were also discussed. The patient understands and wishes to proceed with the procedure. Written consent was obtained. Ultrasound was performed to localize and mark an adequate pocket of fluid in the right chest. The area was then prepped and draped in the normal sterile fashion. 1% Lidocaine was used for local anesthesia. Under ultrasound guidance a 6 Fr Safe-T-Centesis catheter was introduced. Thoracentesis was performed. The catheter was removed and a dressing applied. FINDINGS: A total of approximately 1.6 L of milky brown fluid was removed. Samples were sent to the laboratory as requested by the clinical team. IMPRESSION: Successful ultrasound guided right thoracentesis yielding 1.6 L of pleural fluid. Read by: Soyla Dryer, NP Electronically Signed   By: Sandi Mariscal M.D.   On: 11/13/2019 11:47     Labs:   Basic Metabolic Panel: Recent Labs  Lab 11/12/19 1114 11/12/19 2037 11/13/19 0404 11/13/19 0404 11/14/19 0856 11/14/19 0856 11/15/19 0053 11/15/19 0053 11/16/19 0751 11/17/19 0230  NA   < >  --  134*  --  129*  --  130*  --  126* 132*  K   < >  --  4.8   < > 4.6   < > 4.5   < > 4.8 4.1  CL   < >  --  89*  --  88*  --  90*  --  90* 92*  CO2   < >  --  21*  --  23  --  27  --  23 25    GLUCOSE   < >  --  59*  --  160*  --  182*  --  167* 200*  BUN   < >  --  47*  --  65*  --  36*  --  51* 31*  CREATININE   < >  --  12.15*  --  13.78*  --  9.30*  --  10.91* 8.27*  CALCIUM   < >  --  9.9  --  8.3*  --  8.7*  --  9.8 9.4  MG  --  2.6*  --   --   --   --   --   --   --   --   PHOS  --  9.1*  --   --  9.5*  --   --   --  6.9*  --    < > = values in this interval not displayed.   GFR Estimated Creatinine Clearance: 11.5 mL/min (A) (by C-G formula based on SCr of 8.27 mg/dL (H)). Liver Function Tests: Recent Labs  Lab 11/14/19 0856 11/16/19 0751  ALBUMIN 2.9* 2.9*   No results for input(s): LIPASE, AMYLASE in the last 168 hours. No results for input(s): AMMONIA in the last 168 hours. Coagulation profile No results for input(s): INR, PROTIME in the last 168 hours.  CBC: Recent Labs  Lab 11/13/19 0404 11/14/19 0850 11/15/19 0053 11/16/19 0751 11/17/19 0730  WBC 5.9 6.0 6.0 5.6 5.8  HGB 16.8 15.0 14.8 15.0 14.8  HCT 53.3* 46.0 46.2 45.8 45.5  MCV 96.0 93.9 93.1 92.3 92.7  PLT 120* 117* 104* 113* 120*   Cardiac Enzymes: No results for input(s): CKTOTAL, CKMB, CKMBINDEX, TROPONINI in the last 168 hours. BNP: Invalid input(s): POCBNP CBG: Recent Labs  Lab 11/16/19 1242 11/16/19 1606 11/16/19 2136 11/17/19 0614 11/17/19 1147  GLUCAP 92 137* 264* 169* 146*   D-Dimer No results for input(s): DDIMER in the last 72 hours. Hgb A1c No results for input(s): HGBA1C in the last 72 hours. Lipid Profile No results for input(s): CHOL, HDL, LDLCALC, TRIG, CHOLHDL, LDLDIRECT in the last 72 hours. Thyroid function studies Recent Labs    11/17/19 0230  TSH 7.137*   Anemia work up No results for input(s): VITAMINB12, FOLATE, FERRITIN, TIBC, IRON, RETICCTPCT in the last 72 hours. Microbiology Recent Results (from the past 240 hour(s))  SARS Coronavirus 2 by RT PCR (hospital order, performed in [facility] hospital lab) Nasopharyngeal Nasopharyngeal Swab      Status: None   Collection Time: 11/07/19  8:19 PM   Specimen: Nasopharyngeal Swab  Result Value Ref Range Status   SARS Coronavirus 2 NEGATIVE NEGATIVE Final    Comment: (NOTE) SARS-CoV-2 target nucleic acids are NOT DETECTED.  The SARS-CoV-2 RNA is generally detectable in upper and lower respiratory specimens during the acute phase of infection. The lowest concentration of SARS-CoV-2 viral copies this assay can detect is 250 copies / mL. A negative result does not preclude SARS-CoV-2 infection and should not be used as the sole basis for treatment or other patient management decisions.  A negative result may occur with improper specimen collection / handling, submission of specimen other than nasopharyngeal swab, presence of viral mutation(s) within the areas targeted by this assay, and inadequate number of viral copies (<250 copies / mL). A negative result must be combined with clinical observations, patient history, and epidemiological information.  Fact Sheet for Patients:   StrictlyIdeas.no  Fact Sheet for Healthcare Providers: BankingDealers.co.za  This test is not yet approved or  cleared by the [place] FDA and has been authorized for detection and/or diagnosis of SARS-CoV-2 by FDA under an Emergency Use Authorization (EUA).  This EUA will remain in effect (meaning this test can be used) for the duration of the COVID-19 declaration under Section 564(b)(1) of the Act, 21 U.S.C. section 360bbb-3(b)(1), unless the authorization is terminated or revoked sooner.  Performed at [facility], [place] [address], [place], [place] [address]   SARS Coronavirus 2 by RT PCR (hospital order, performed in [facility] hospital lab) Nasopharyngeal Nasopharyngeal Swab     Status: None   Collection Time: 11/12/19  8:37 PM   Specimen: Nasopharyngeal Swab  Result Value Ref Range Status   SARS Coronavirus 2 NEGATIVE NEGATIVE Final     Comment: (NOTE) SARS-CoV-2 target nucleic acids are NOT DETECTED.  The SARS-CoV-2 RNA is generally detectable in upper and lower respiratory specimens during the acute phase of infection. The lowest concentration of SARS-CoV-2 viral copies this assay can detect is 250 copies / mL. A  negative result does not preclude SARS-CoV-2 infection and should not be used as the sole basis for treatment or other patient management decisions.  A negative result may occur with improper specimen collection / handling, submission of specimen other than nasopharyngeal swab, presence of viral mutation(s) within the areas targeted by this assay, and inadequate number of viral copies (<250 copies / mL). A negative result must be combined with clinical observations, patient history, and epidemiological information.  Fact Sheet for Patients:   StrictlyIdeas.no  Fact Sheet for Healthcare Providers: BankingDealers.co.za  This test is not yet approved or  cleared by the [place] FDA and has been authorized for detection and/or diagnosis of SARS-CoV-2 by FDA under an Emergency Use Authorization (EUA).  This EUA will remain in effect (meaning this test can be used) for the duration of the COVID-19 declaration under Section 564(b)(1) of the Act, 21 U.S.C. section 360bbb-3(b)(1), unless the authorization is terminated or revoked sooner.  Performed at [facility], [place] [address], [place], [place] [address]   Blood culture (routine x 2)     Status: None (Preliminary result)   Collection Time: 11/13/19  1:51 AM   Specimen: BLOOD  Result Value Ref Range Status   Specimen Description BLOOD SITE NOT SPECIFIED  Final   Special Requests   Final    BOTTLES DRAWN AEROBIC AND ANAEROBIC Blood Culture results may not be optimal due to an inadequate volume of blood received in culture bottles   Culture   Final    NO GROWTH 4 DAYS Performed at [facility], [place] [address], [place], [place] [address]    Report Status PENDING  Incomplete  Blood culture (routine x 2)     Status: None (Preliminary result)   Collection Time: 11/13/19  4:04 AM   Specimen: BLOOD RIGHT HAND  Result Value Ref Range Status   Specimen Description BLOOD RIGHT HAND  Final   Special Requests   Final    BOTTLES DRAWN AEROBIC AND ANAEROBIC Blood Culture results may not be optimal due to an inadequate volume of blood received in culture bottles   Culture   Final    NO GROWTH 4 DAYS Performed at [facility], [place] [address] [place], [place] [address]    Report Status PENDING  Incomplete  Body fluid culture     Status: None   Collection Time: 11/13/19 11:45 AM   Specimen: Lung, Right; Pleural Fluid  Result Value Ref Range Status   Specimen Description PLEURAL FLUID  Final   Special Requests RIGHT LUNG  Final   Gram Stain   Final    RARE WBC PRESENT,BOTH PMN AND MONONUCLEAR NO ORGANISMS SEEN    Culture   Final    NO GROWTH 3 DAYS Performed at [facility], [place] [address], [place], [place] [address]    Report Status 11/16/2019 FINAL  Final  Fungus Culture With Stain     Status: None (Preliminary result)   Collection Time: 11/13/19 11:45 AM   Specimen: Lung, Right; Pleural Fluid  Result Value Ref Range Status   Fungus Stain Final report  Final    Comment: (NOTE) Performed At: [facility] [place], [place] [id number] Rush Farmer MD [id number]    Fungus (Mycology) Culture PENDING  Incomplete   Fungal Source PLEURAL  Final    Comment: FLUID RIGHT LUNG Performed at [facility], [place] [address], [place], [place] [address]   Fungus Culture  Result     Status: None   Collection Time: 11/13/19 11:45 AM  Result Value Ref Range Status   Result 1 Comment  Final    Comment: (NOTE) KOH/Calcofluor preparation:  no fungus observed. Performed At: [facility] [place], [place] [id number] Rush Farmer  MD [id number]      Discharge Instructions:   Discharge Instructions    Discharge instructions   Complete by: As directed    Renal/carb mod Be careful of how much fluid you are taking in   Increase activity slowly   Complete by: As directed      Allergies as of 11/17/2019      Reactions   Gabapentin Other (See Comments)   Altered mental status      Medication List    STOP taking these medications   ferric citrate 1 GM 210 MG(Fe) tablet Commonly known as: AURYXIA     TAKE these medications   accu-chek soft touch lancets Use as instructed   acetaminophen 325 MG tablet Commonly known as: TYLENOL Take 1-2 tablets (325-650 mg total) by mouth every 4 (four) hours as needed for mild pain.   albuterol 108 (90 Base) MCG/ACT inhaler Commonly known as: VENTOLIN HFA Inhale 1 puff into the lungs every 4 (four) hours as needed for wheezing or shortness of breath.   BiDil 20-37.5 MG tablet Generic drug: isosorbide-hydrALAZINE Take 1 tablet by mouth 3 (three) times daily.   carvedilol 3.125 MG tablet Commonly known as: COREG Take 1 tablet (3.125 mg total) by mouth 2 (two) times daily with a meal. What changed:   medication strength  how much to take   docusate sodium 100 MG capsule Commonly known as: COLACE Take 1 capsule (100 mg total) by mouth 2 (two) times daily as needed for mild constipation.   fluticasone 50 MCG/ACT nasal spray Commonly known as: FLONASE Place 1 spray into both nostrils daily as needed for allergies.   glucose blood test strip 1 each by Other route as needed for other. Use as instructed   HumuLIN N KwikPen 100 UNIT/ML Kiwkpen Generic drug: Insulin NPH (Human) (Isophane) Inject 0-4 Units into the skin every morning. And pen needles 1/day   latanoprost 0.005 % ophthalmic solution Commonly known as: XALATAN Place 1 drop into both eyes at bedtime.   levETIRAcetam 500 MG tablet Commonly known as: KEPPRA Take 1 tablet (500 mg total) by  mouth at bedtime.   levothyroxine 50 MCG tablet Commonly known as: SYNTHROID Take 1 tablet (50 mcg total) by mouth daily.   metoCLOPramide 5 MG tablet Commonly known as: Reglan Take 1 tablet (5 mg total) by mouth 3 (three) times daily before meals. What changed: when to take this   multivitamin Tabs tablet Take 1 tablet by mouth at bedtime.   omeprazole 20 MG capsule Commonly known as: PRILOSEC Take 1 capsule (20 mg total) by mouth at bedtime.   PhosLo 667 MG capsule Generic drug: calcium acetate Take 2,668 mg by mouth 3 (three) times daily with meals.   timolol 0.5 % ophthalmic solution Commonly known as: BETIMOL Place 1 drop into both eyes 2 (two) times daily.       Follow-up Information    Horald Pollen, MD Follow up in 1 week(s).   Specialty: Internal Medicine Contact information: [place] [address] [phone]        Lorretta Harp, MD .   Specialties: Cardiology, Radiology Contact information: [address] [place]  [address] [phone]                Time coordinating discharge: 35 min  Signed:  Geradine Girt DO  [facility] 11/17/2019, 12:02 PM

## 2019-11-17 NOTE — Progress Notes (Signed)
Pt given discharge summary and discharged via daughter as transportation.

## 2019-11-18 LAB — CULTURE, BLOOD (ROUTINE X 2)
Culture: NO GROWTH
Culture: NO GROWTH

## 2019-12-06 ENCOUNTER — Telehealth: Payer: Self-pay | Admitting: *Deleted

## 2019-12-06 NOTE — Telephone Encounter (Signed)
Left message in voice mail for patient or Kenney Houseman (spouse) to call to discuss medication patient is taking. Checked DPR (03/07/2019) and after I saw a different number.

## 2019-12-12 LAB — FUNGUS CULTURE WITH STAIN

## 2019-12-12 LAB — FUNGAL ORGANISM REFLEX

## 2019-12-12 LAB — FUNGUS CULTURE RESULT

## 2019-12-17 ENCOUNTER — Telehealth: Payer: Self-pay | Admitting: *Deleted

## 2019-12-17 NOTE — Telephone Encounter (Signed)
Left message in mobile voice mail to call back to discuss current medications.

## 2019-12-18 ENCOUNTER — Telehealth: Payer: Self-pay | Admitting: *Deleted

## 2019-12-18 NOTE — Telephone Encounter (Signed)
Left message in voice mail this morning to call back to review current medications, [facility]) is requesting.

## 2020-01-20 IMAGING — CR CHEST - 2 VIEW
2 series · 2 of 2 positions shown · non-contrast
Comparison: Radiographs 05/12/2018 and 05/02/2018. CT 05/02/2018
and 11/09/2017.

CLINICAL DATA: Fever. Left foot infection. History of diabetes,
end-stage renal disease and pneumonia.

EXAM:
CHEST - 2 VIEW

[chest ap]
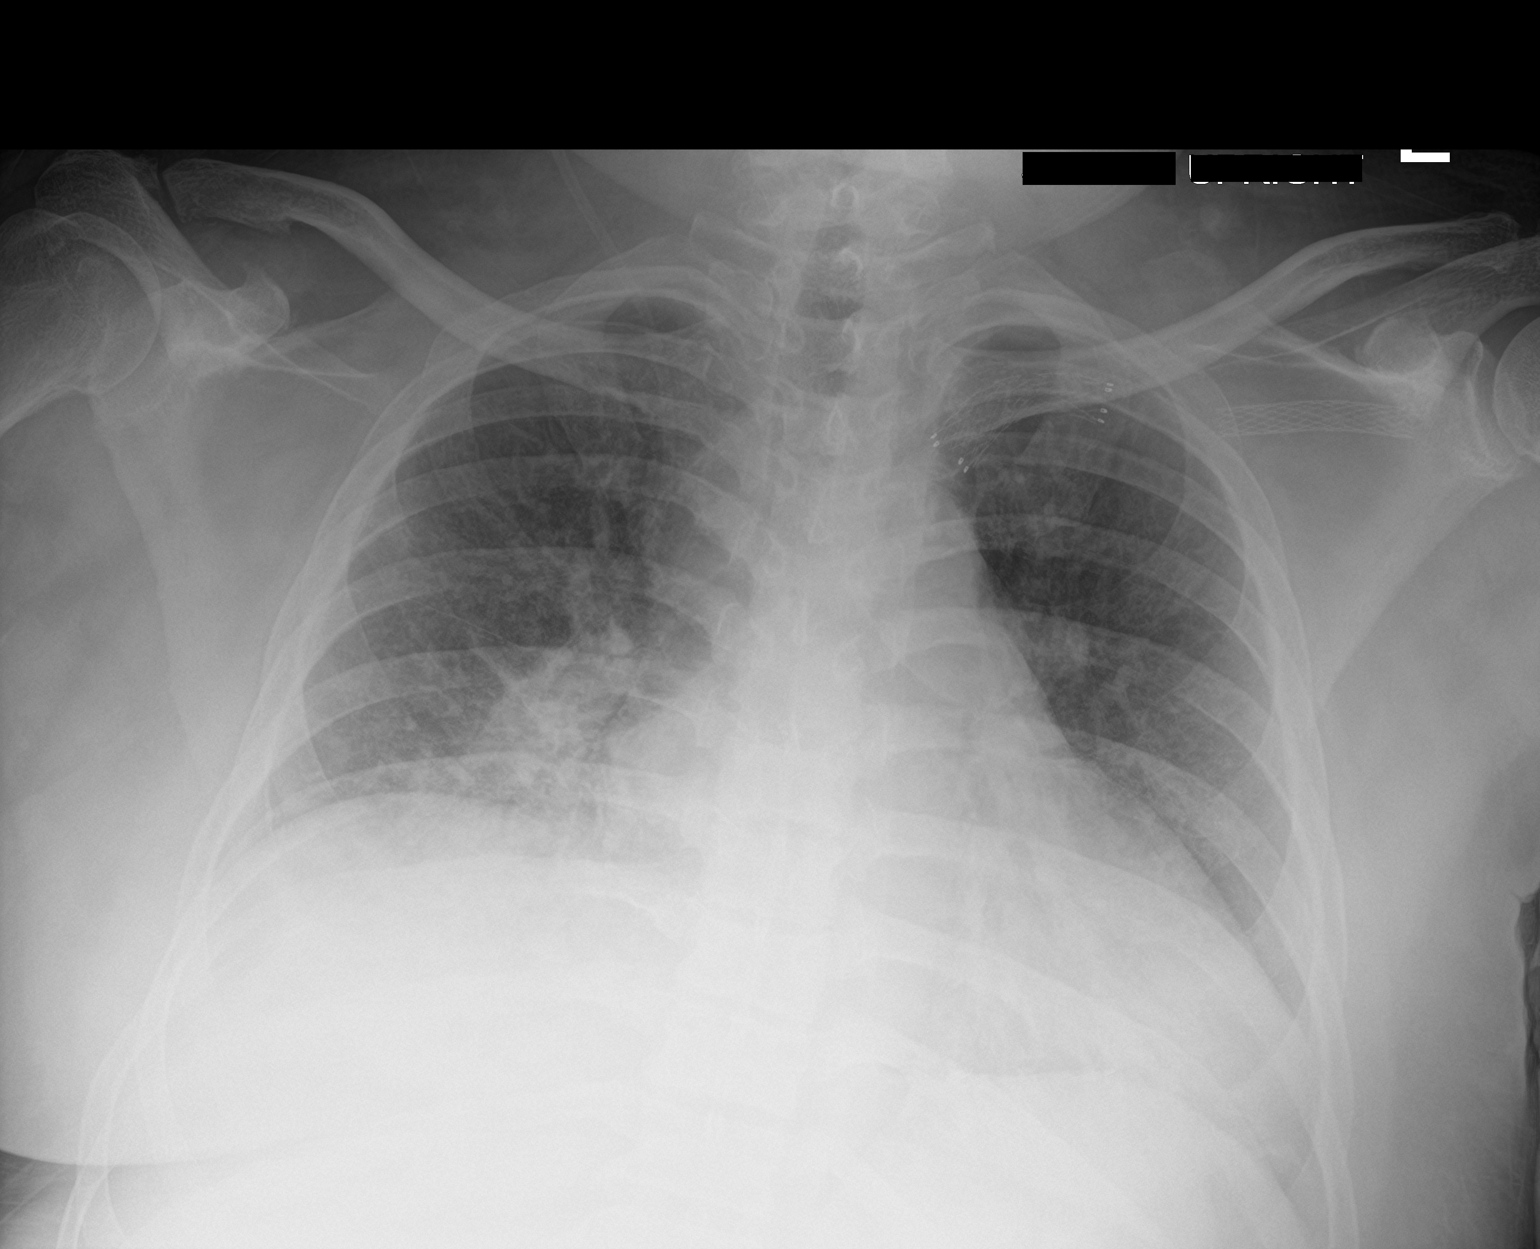

[chest lat]
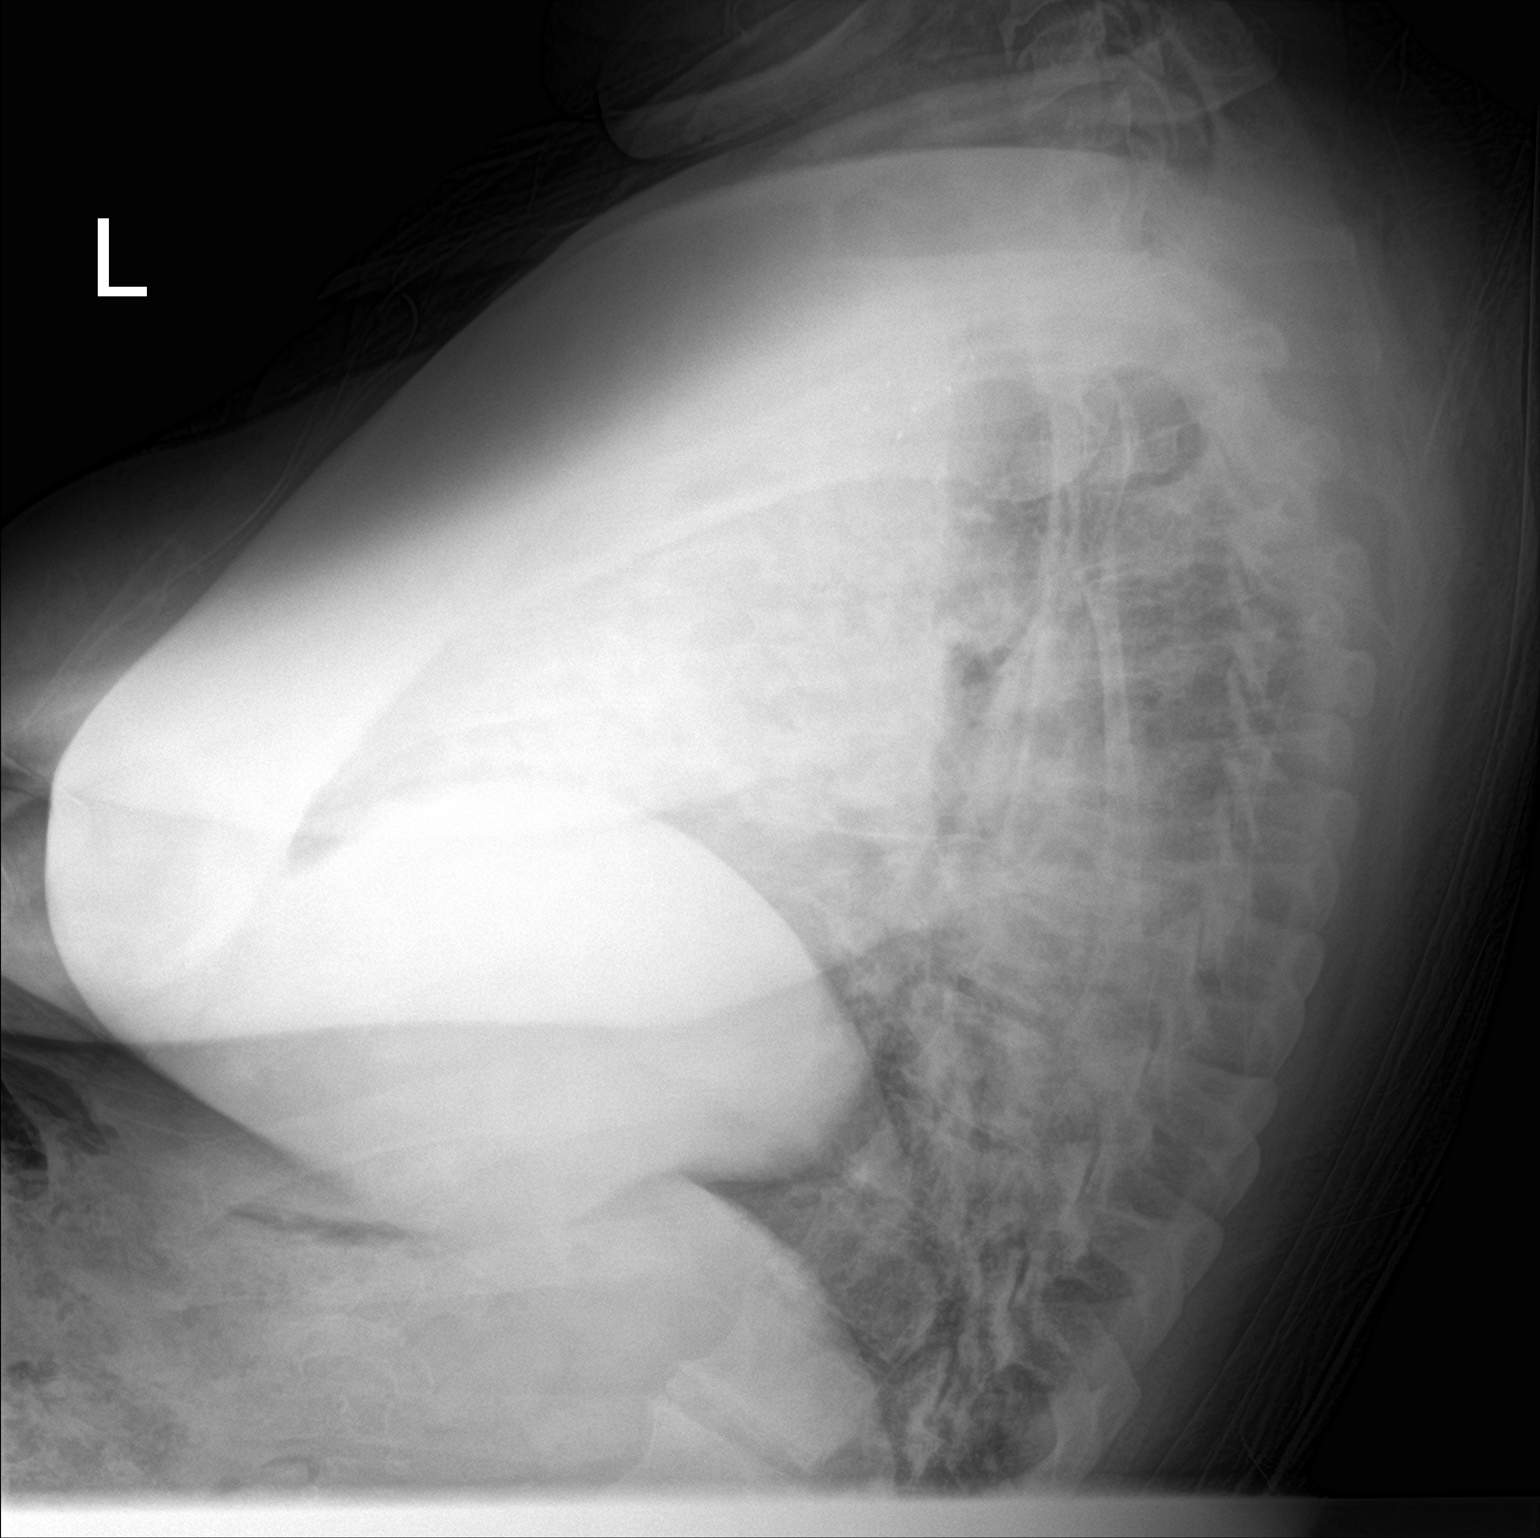

[2 of 2 positions shown; findings below may reference images not displayed]

FINDINGS: The heart size and mediastinal contours are stable. The patient has
known chronic adenopathy in the right paratracheal and subcarinal
regions which appears stable. Vascular stents are noted in the left
subclavian and axillary vessels. Patchy right perihilar and lower
lobe airspace opacities appear largely chronic, not significantly
changed over the recent prior studies. The left lung is clear. There
is no pleural effusion or pneumothorax. No acute osseous findings.
IMPRESSION: No significant change in the appearance of the chest compared with
prior studies obtained over the last 8 months. The right basilar
airspace opacity appears chronic and may reflect postinflammatory
scarring or chronic atypical inflammation. Grossly stable
mediastinal adenopathy.

## 2020-01-20 IMAGING — CR LEFT FOOT - COMPLETE 3+ VIEW
4 series · 4 of 4 positions shown · non-contrast
Comparison: None.

CLINICAL DATA: Fever. Left foot infection. History of diabetes,
end-stage renal disease and pneumonia.

EXAM:
LEFT FOOT - COMPLETE 3+ VIEW

[foot ap]
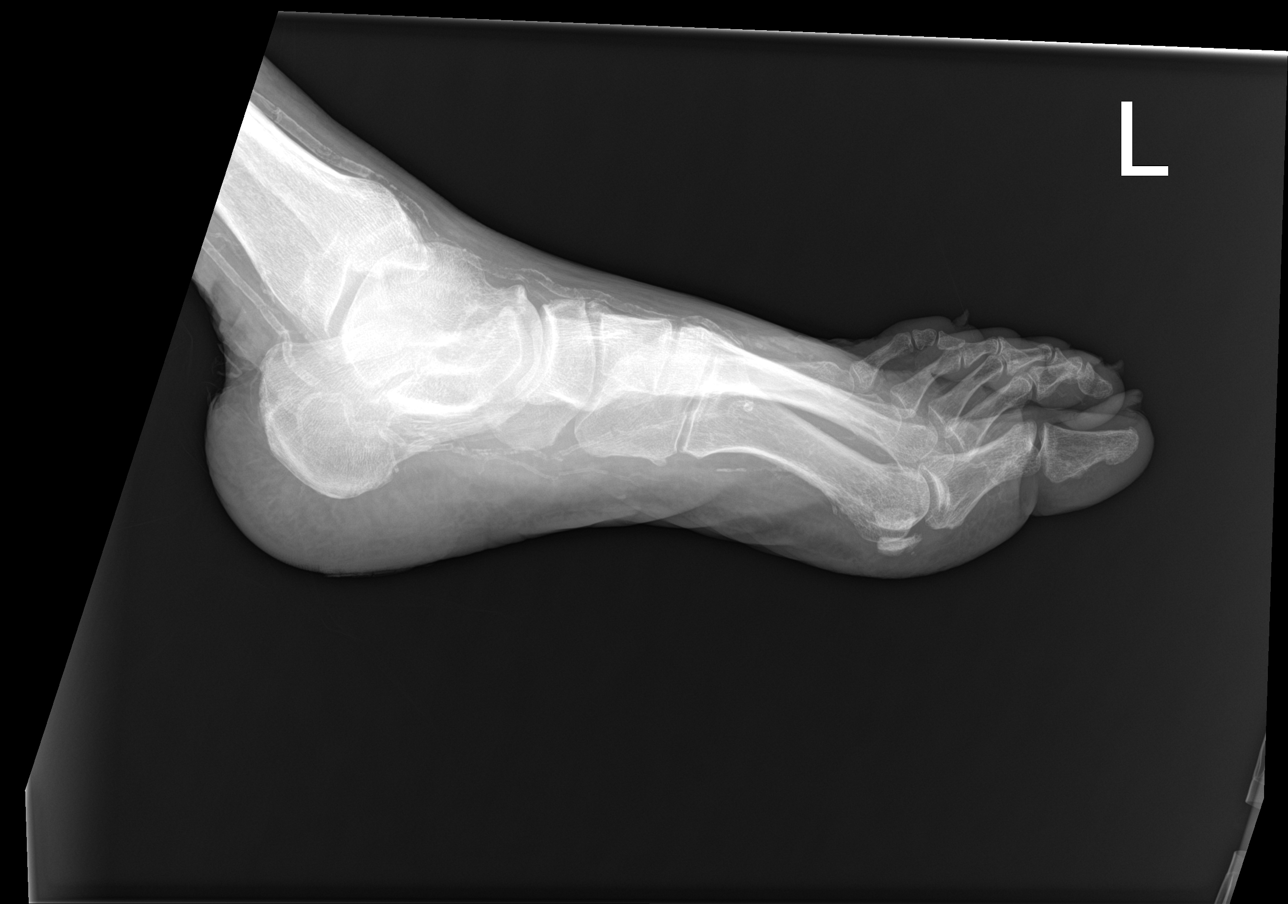

[foot obl (1 of 2)]
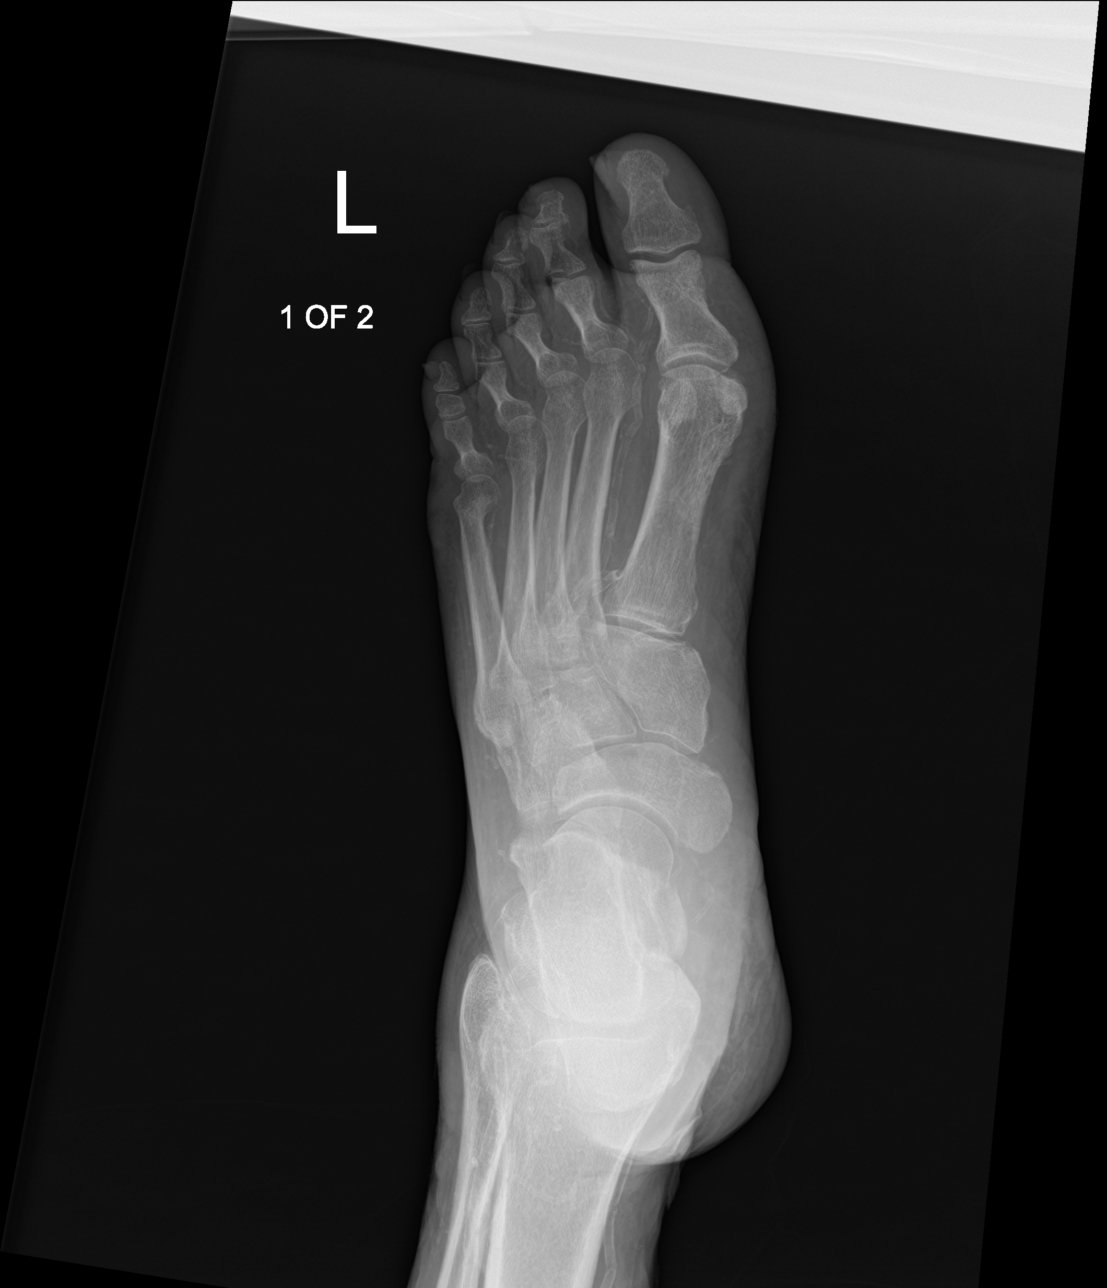

[foot lat]
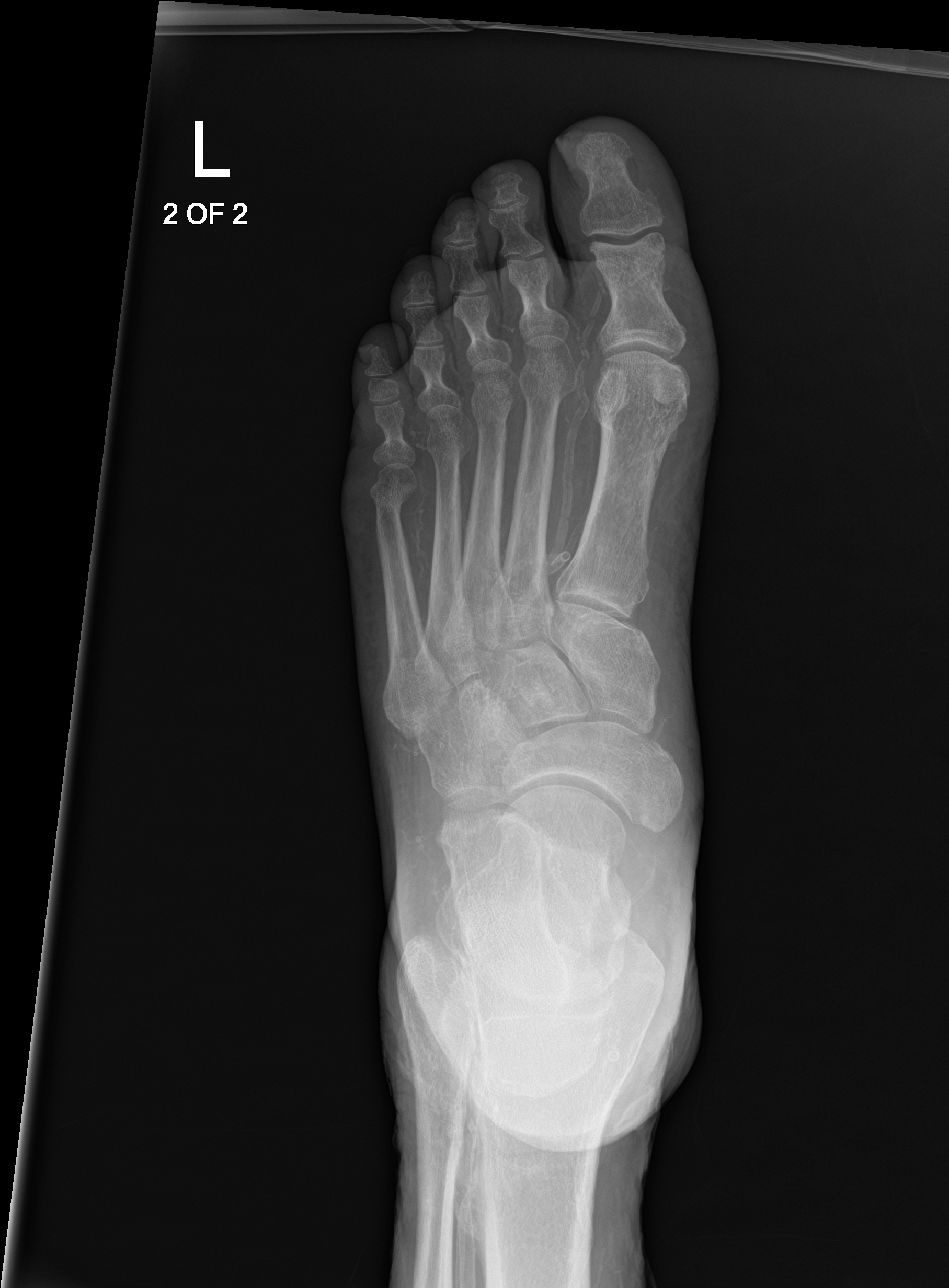

[foot obl (2 of 2)]
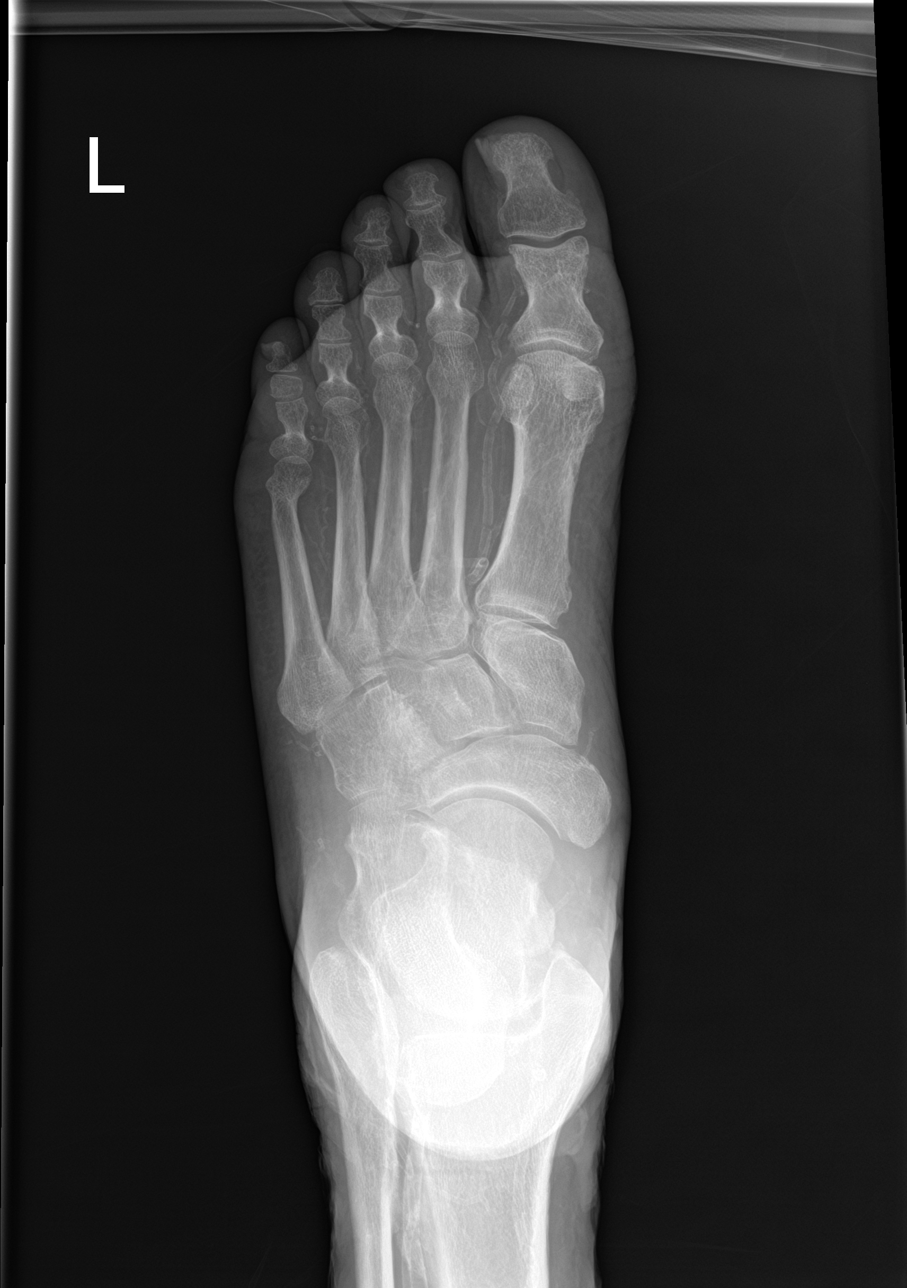

[4 of 4 positions shown; findings below may reference images not displayed]

FINDINGS: There is no evidence of acute fracture or dislocation. There are
possible old healed fractures of the 4th and 5th metatarsal necks.
There are minimal degenerative changes at the 1st
metatarsophalangeal joint. No erosive changes or bone destruction
identified. There are prominent vascular calcifications consistent
with diabetes. The soft tissues are diffusely prominent without
focal swelling or foreign body.
IMPRESSION: No acute osseous findings or evidence of osteomyelitis. Prominent
vascular calcifications.

## 2020-02-11 ENCOUNTER — Other Ambulatory Visit: Payer: Self-pay

## 2020-02-11 ENCOUNTER — Encounter (HOSPITAL_COMMUNITY): Payer: Self-pay | Admitting: Emergency Medicine

## 2020-02-11 ENCOUNTER — Emergency Department (HOSPITAL_COMMUNITY)
Admission: EM | Admit: 2020-02-11 | Discharge: 2020-02-12 | Disposition: A | Discharge status: Home / Self Care | Payer: Medicare Other | Attending: Emergency Medicine | Admitting: Emergency Medicine

## 2020-02-11 DIAGNOSIS — E039 Hypothyroidism, unspecified: Secondary | ICD-10-CM | POA: Diagnosis not present

## 2020-02-11 DIAGNOSIS — I5043 Acute on chronic combined systolic (congestive) and diastolic (congestive) heart failure: Secondary | ICD-10-CM | POA: Insufficient documentation

## 2020-02-11 DIAGNOSIS — E1122 Type 2 diabetes mellitus with diabetic chronic kidney disease: Secondary | ICD-10-CM | POA: Diagnosis not present

## 2020-02-11 DIAGNOSIS — E1140 Type 2 diabetes mellitus with diabetic neuropathy, unspecified: Secondary | ICD-10-CM | POA: Diagnosis not present

## 2020-02-11 DIAGNOSIS — Z992 Dependence on renal dialysis: Secondary | ICD-10-CM | POA: Diagnosis not present

## 2020-02-11 DIAGNOSIS — N4889 Other specified disorders of penis: Secondary | ICD-10-CM | POA: Diagnosis not present

## 2020-02-11 DIAGNOSIS — Z79899 Other long term (current) drug therapy: Secondary | ICD-10-CM | POA: Diagnosis not present

## 2020-02-11 DIAGNOSIS — N186 End stage renal disease: Secondary | ICD-10-CM | POA: Diagnosis not present

## 2020-02-11 DIAGNOSIS — Z794 Long term (current) use of insulin: Secondary | ICD-10-CM | POA: Insufficient documentation

## 2020-02-11 DIAGNOSIS — E1139 Type 2 diabetes mellitus with other diabetic ophthalmic complication: Secondary | ICD-10-CM | POA: Diagnosis not present

## 2020-02-11 DIAGNOSIS — I132 Hypertensive heart and chronic kidney disease with heart failure and with stage 5 chronic kidney disease, or end stage renal disease: Secondary | ICD-10-CM | POA: Diagnosis not present

## 2020-02-11 NOTE — ED Triage Notes (Signed)
Patient reports intermittent penile pain onset this morning , denies injury , no discharge or bleeding .

## 2020-02-12 LAB — CBC WITH DIFFERENTIAL/PLATELET
Abs Immature Granulocytes: 0.03 10*3/uL (ref 0.00–0.07)
Basophils Absolute: 0.1 10*3/uL (ref 0.0–0.1)
Basophils Relative: 1 %
Eosinophils Absolute: 0.2 10*3/uL (ref 0.0–0.5)
Eosinophils Relative: 2 %
HCT: 48.9 % (ref 39.0–52.0)
Hemoglobin: 15.3 g/dL (ref 13.0–17.0)
Immature Granulocytes: 0 %
Lymphocytes Relative: 26 %
Lymphs Abs: 1.9 10*3/uL (ref 0.7–4.0)
MCH: 30.8 pg (ref 26.0–34.0)
MCHC: 31.3 g/dL (ref 30.0–36.0)
MCV: 98.6 fL (ref 80.0–100.0)
Monocytes Absolute: 0.8 10*3/uL (ref 0.1–1.0)
Monocytes Relative: 11 %
Neutro Abs: 4.3 10*3/uL (ref 1.7–7.7)
Neutrophils Relative %: 60 %
Platelets: 149 10*3/uL — ABNORMAL LOW (ref 150–400)
RBC: 4.96 MIL/uL (ref 4.22–5.81)
RDW: 16.3 % — ABNORMAL HIGH (ref 11.5–15.5)
WBC: 7.3 10*3/uL (ref 4.0–10.5)
nRBC: 0 % (ref 0.0–0.2)

## 2020-02-12 LAB — BASIC METABOLIC PANEL
Anion gap: 17 — ABNORMAL HIGH (ref 5–15)
BUN: 46 mg/dL — ABNORMAL HIGH (ref 6–20)
CO2: 29 mmol/L (ref 22–32)
Calcium: 9.7 mg/dL (ref 8.9–10.3)
Chloride: 93 mmol/L — ABNORMAL LOW (ref 98–111)
Creatinine, Ser: 10.13 mg/dL — ABNORMAL HIGH (ref 0.61–1.24)
GFR, Estimated: 6 mL/min — ABNORMAL LOW (ref >60)
Glucose, Bld: 264 mg/dL — ABNORMAL HIGH (ref 70–99)
Potassium: 4.2 mmol/L (ref 3.5–5.1)
Sodium: 139 mmol/L (ref 135–145)

## 2020-02-12 MED ORDER — OXYCODONE-ACETAMINOPHEN 5-325 MG PO TABS
1.0000 | ORAL_TABLET | Freq: Once | ORAL | Status: AC
  Administered 2020-02-12: 1 via ORAL
  Filled 2020-02-12: qty 1

## 2020-02-12 MED ORDER — CIPROFLOXACIN HCL 500 MG PO TABS: 500.0000 mg | ORAL_TABLET | Freq: Two times a day (BID) | ORAL | 0 refills | Status: AC

## 2020-02-12 MED ORDER — CIPROFLOXACIN HCL 500 MG PO TABS
500.0000 mg | ORAL_TABLET | Freq: Once | ORAL | Status: AC
  Administered 2020-02-12: 500 mg via ORAL
  Filled 2020-02-12: qty 1

## 2020-02-12 NOTE — ED Provider Notes (Signed)
[facility] EMERGENCY DEPARTMENT Provider Note   CSN: [id number] Arrival date & time: 02/11/20  2319     History Chief Complaint  Patient presents with  . Penile Pain    Howard Bell is a 54 y.o. male.  Patient states he has had 2 episodes of sharp stabbing pain in his penis radiating to his abdomen.  One episode was this morning with episode of his evening.  Patient states that last on this happened his diagnosis prostatitis and feels this is exactly the same.  He makes no urine because he is a dialysis patient.  He had no trauma.  No infectious.  He states that he is not sexually active at this time.  He does have some leakage of fluid consistent with urine but generally speaking does not make urine.  No abdominal pain.  No fevers.  No other associated symptoms.        Past Medical History:  Diagnosis Date  . Arthritis   . Congestive heart failure (CHF) ([place])   . Depression   . 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 Diabetic gastroparesis ([place]) 11/06/2017  . ESRD (end stage renal disease) on dialysis [facility])    M,W.F dialysis  . H/O seasonal allergies   . Hypertension   . Hypothyroidism   . Peripheral vascular disease ([place])   . Pneumonia   . Sleep apnea    not using it now, on continuous O2 2 L during the day, 3 L during the night    Patient Active Problem List   Diagnosis Date Noted  . Pleural effusion 11/13/2019  . Metabolic encephalopathy 54/00/8676  . Community acquired pneumonia 10/09/2019  . Acute metabolic encephalopathy 19/50/9326  . Type 2 diabetes mellitus with ESRD (end-stage renal disease) ([place]) 10/09/2019  . Pneumonia 10/09/2019  . Hypotension 10/08/2019  . H/O stroke without residual deficits   . Acute on chronic combined systolic and diastolic CHF (congestive heart failure) ([place]) 08/18/2019  . Seizures ([place]) 08/16/2019  . Abnormality of gait 01/30/2019  . S/P AKA (above knee amputation)  bilateral ([place]) 11/07/2018  . Chronic hypercapnic respiratory failure ([place]) 10/02/2018  . OSA (obstructive sleep apnea)   . S/P AKA (above knee amputation) unilateral, right ([place])   . Diabetic peripheral neuropathy ([place])   . Status post bilateral above knee amputation ([place]) 09/08/2018  . Atherosclerosis of native arteries of extremities with gangrene, left leg ([place]) 09/05/2018  . Peripheral arterial disease ([place])   . Unilateral AKA, right ([place]) 08/17/2018  . Drug induced constipation   . Uncontrolled diabetes mellitus type 2 with peripheral artery disease ([place])   . Anemia of chronic disease   . Diabetes mellitus type 2 in nonobese (HCC)   . Supplemental oxygen dependent   . Unilateral AKA, left ([place]) 08/07/2018  . S/P BKA (below knee amputation), right ([place]) 08/04/2018  . Critical limb ischemia with history of revascularization of same extremity ([place]) 05/09/2018  . 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 ([place]) 11/16/2017  . CKD stage 5 due to type 2 diabetes mellitus ([place]) 11/16/2017  . Glaucoma due to type 2 diabetes mellitus ([place]) 11/16/2017  . Chronic generalized pain 11/16/2017  . Recurrent pneumonia 11/06/2017  . Diabetic gastroparesis ([place]) 11/06/2017  . Generalized abdominal pain 09/13/2017  . Pain in right shoulder 07/26/2017  . ESRD on dialysis [facility])  03/28/2017  . Anemia due to end stage renal disease ([place]) 08/04/2016  . Elevated troponin     Past Surgical History:  Procedure Laterality Date  . AMPUTATION Right 05/14/2018   Procedure: AMPUTATION BELOW KNEE;  Surgeon: Newt Minion, MD;  Location: [facility];  Service: Orthopedics;  Laterality: Right;  . AMPUTATION Right 08/04/2018   Procedure: RIGHT ABOVE KNEE AMPUTATION;  Surgeon: Newt Minion, MD;  Location: [facility];  Service: Orthopedics;  Laterality: Right;  . AMPUTATION Left 09/08/2018    Procedure: LEFT ABOVE KNEE AMPUTATION;  Surgeon: Newt Minion, MD;  Location: [facility];  Service: Orthopedics;  Laterality: Left;  . APPLICATION OF WOUND VAC Left 09/08/2018   Procedure: Application Of Wound Vac;  Surgeon: Newt Minion, MD;  Location: [facility];  Service: Orthopedics;  Laterality: Left;  . ESOPHAGOGASTRODUODENOSCOPY (EGD) WITH PROPOFOL N/A 12/05/2017   Procedure: ESOPHAGOGASTRODUODENOSCOPY (EGD) WITH PROPOFOL;  Surgeon: Doran Stabler, MD;  Location: [facility];  Service: Gastroenterology;  Laterality: N/A;  . HERNIA REPAIR    . IR AV DIALY SHUNT INTRO NEEDLE/INTRACATH INITIAL W/PTA/IMG LEFT  03/30/2017  . IR THORACENTESIS ASP PLEURAL SPACE W/IMG GUIDE  11/13/2019       Family History  Problem Relation Age of Onset  . Diabetes Mother   . Hypertension Father     Social History   Tobacco Use  . Smoking status: Never Smoker  . Smokeless tobacco: Never Used  Vaping Use  . Vaping Use: Never used  Substance Use Topics  . Alcohol use: No  . Drug use: No    Home Medications Prior to Admission medications   Medication Sig Start Date End Date Taking? Authorizing Provider  acetaminophen (TYLENOL) 325 MG tablet Take 1-2 tablets (325-650 mg total) by mouth every 4 (four) hours as needed for mild pain. Patient not taking: Reported on 11/13/2019 09/26/18   Love, Ivan Anchors, PA-C  albuterol (PROVENTIL HFA;VENTOLIN HFA) 108 (90 Base) MCG/ACT inhaler Inhale 1 puff into the lungs every 4 (four) hours as needed for wheezing or shortness of breath.     [provider]  calcium acetate (PHOSLO) 667 MG capsule Take 2,668 mg by mouth 3 (three) times daily with meals.    [provider]  carvedilol (COREG) 3.125 MG tablet Take 1 tablet (3.125 mg total) by mouth 2 (two) times daily with a meal. 11/17/19   Geradine Girt, DO  ciprofloxacin (CIPRO) 500 MG tablet Take 1 tablet (500 mg total) by mouth 2 (two) times daily. 02/12/20   Amire Leazer, Corene Cornea, MD  docusate sodium (COLACE) 100  MG capsule Take 1 capsule (100 mg total) by mouth 2 (two) times daily as needed for mild constipation. 11/17/19   Geradine Girt, DO  fluticasone (FLONASE) 50 MCG/ACT nasal spray Place 1 spray into both nostrils daily as needed for allergies.     [provider]  glucose blood test strip 1 each by Other route as needed for other. Use as instructed 10/31/18   Horald Pollen, MD  Insulin NPH, Human,, Isophane, (HUMULIN N KWIKPEN) 100 UNIT/ML Kiwkpen Inject 0-4 Units into the skin every morning. And pen needles 1/day 11/17/19   Geradine Girt, DO  isosorbide-hydrALAZINE (BIDIL) 20-37.5 MG tablet Take 1 tablet by mouth 3 (three) times daily. 08/19/19   Thurnell Lose, MD  Lancets (ACCU-CHEK SOFT [facility]) lancets Use as instructed 10/31/18   Horald Pollen, MD  latanoprost (XALATAN) 0.005 % ophthalmic solution Place 1 drop into both  eyes at bedtime.    [provider]  levETIRAcetam (KEPPRA) 500 MG tablet Take 1 tablet (500 mg total) by mouth at bedtime. 08/19/19   Thurnell Lose, MD  levothyroxine (SYNTHROID) 50 MCG tablet Take 1 tablet (50 mcg total) by mouth daily. 09/12/19   Renato Shin, MD  metoCLOPramide (REGLAN) 5 MG tablet Take 1 tablet (5 mg total) by mouth 3 (three) times daily before meals. Patient taking differently: Take 5 mg by mouth 2 (two) times daily before a meal.  08/30/18   Love, Ivan Anchors, PA-C  multivitamin (RENA-VIT) TABS tablet Take 1 tablet by mouth at bedtime. 01/22/19   Horald Pollen, MD  omeprazole (PRILOSEC) 20 MG capsule Take 1 capsule (20 mg total) by mouth at bedtime. 01/22/19   Horald Pollen, MD  timolol (BETIMOL) 0.5 % ophthalmic solution Place 1 drop into both eyes 2 (two) times daily. 05/07/17   Ivar Drape D, PA    Allergies    Gabapentin  Review of Systems   Review of Systems  All other systems reviewed and are negative.   Physical Exam Updated Vital Signs BP 116/69   Pulse 87   Temp 98.2 F (36.8 C)  (Oral)   Resp 19   Wt 115 kg   SpO2 97%   BMI 34.38 kg/m   Physical Exam Vitals and nursing note reviewed.  Constitutional:      Appearance: He is well-developed.  HENT:     Head: Normocephalic and atraumatic.     Nose: No congestion or rhinorrhea.     Mouth/Throat:     Mouth: Mucous membranes are moist.  Eyes:     Pupils: Pupils are equal, round, and reactive to light.  Cardiovascular:     Rate and Rhythm: Normal rate.  Pulmonary:     Effort: Pulmonary effort is normal. No respiratory distress.  Abdominal:     General: There is no distension.  Genitourinary:    Penis: Normal.   Musculoskeletal:        General: Normal range of motion.     Cervical back: Normal range of motion.  Skin:    General: Skin is warm and dry.  Neurological:     General: No focal deficit present.     Mental Status: He is alert.     ED Results / Procedures / Treatments   Labs (all labs ordered are listed, but only abnormal results are displayed) Labs Reviewed  CBC WITH DIFFERENTIAL/PLATELET - Abnormal; Notable for the following components:      Result Value   RDW 16.3 (*)    Platelets 149 (*)    All other components within normal limits  BASIC METABOLIC PANEL - Abnormal; Notable for the following components:   Chloride 93 (*)    Glucose, Bld 264 (*)    BUN 46 (*)    Creatinine, Ser 10.13 (*)    GFR, Estimated 6 (*)    Anion gap 17 (*)    All other components within normal limits    EKG None  Radiology No results found.  Procedures Procedures (including critical care time)  Medications Ordered in ED Medications  ciprofloxacin (CIPRO) tablet 500 mg (500 mg Oral Given 02/12/20 0341)  oxyCODONE-acetaminophen (PERCOCET/ROXICET) 5-325 MG per tablet 1 tablet (1 tablet Oral Given 02/12/20 0341)    ED Course  I have reviewed the triage vital signs and the nursing notes.  Pertinent labs & imaging results that were available during my care of the patient were  reviewed by me and  considered in my medical decision making (see chart for details).    MDM Rules/Calculators/A&P                          Suspect and will treat for prostatitis. Urology follow up PRN if not improving.   Final Clinical Impression(s) / ED Diagnoses Final diagnoses:  Penile pain    Rx / DC Orders ED Discharge Orders         Ordered    ciprofloxacin (CIPRO) 500 MG tablet  2 times daily        02/12/20 0335           Idaliz Tinkle, Corene Cornea, MD 02/12/20 [phone]

## 2020-03-20 ENCOUNTER — Ambulatory Visit: Payer: Medicare Other | Admitting: Emergency Medicine

## 2020-03-25 ENCOUNTER — Ambulatory Visit: Payer: Medicare Other | Admitting: Family Medicine

## 2020-03-26 ENCOUNTER — Encounter: Payer: Self-pay | Admitting: Family Medicine

## 2020-04-22 DEATH — deceased
